# Patient Record
Sex: Male | Born: 1935 | Race: White | Hispanic: No | Marital: Married | Smoking: Never smoker
Health system: Southern US, Community
[De-identification: ages and names within clinical notes are randomized; demographics above are authoritative.]

## PROBLEM LIST (undated history)

## (undated) DIAGNOSIS — I10 Essential (primary) hypertension: Secondary | ICD-10-CM

## (undated) DIAGNOSIS — R001 Bradycardia, unspecified: Secondary | ICD-10-CM

## (undated) DIAGNOSIS — E785 Hyperlipidemia, unspecified: Secondary | ICD-10-CM

## (undated) DIAGNOSIS — I2510 Atherosclerotic heart disease of native coronary artery without angina pectoris: Secondary | ICD-10-CM

## (undated) DIAGNOSIS — N400 Enlarged prostate without lower urinary tract symptoms: Secondary | ICD-10-CM

## (undated) DIAGNOSIS — R42 Dizziness and giddiness: Secondary | ICD-10-CM

## (undated) DIAGNOSIS — I639 Cerebral infarction, unspecified: Secondary | ICD-10-CM

## (undated) DIAGNOSIS — R1013 Epigastric pain: Secondary | ICD-10-CM

## (undated) DIAGNOSIS — I6521 Occlusion and stenosis of right carotid artery: Secondary | ICD-10-CM

## (undated) DIAGNOSIS — K3189 Other diseases of stomach and duodenum: Secondary | ICD-10-CM

## (undated) HISTORY — PX: HIP SURGERY: SHX245

## (undated) HISTORY — DX: Dizziness and giddiness: R42

## (undated) HISTORY — DX: Epigastric pain: R10.13

## (undated) HISTORY — DX: Hyperlipidemia, unspecified: E78.5

## (undated) HISTORY — PX: OTHER SURGICAL HISTORY: SHX169

## (undated) HISTORY — DX: Occlusion and stenosis of right carotid artery: I65.21

## (undated) HISTORY — DX: Cerebral infarction, unspecified: I63.9

## (undated) HISTORY — PX: SKIN CANCER EXCISION: SHX779

## (undated) HISTORY — DX: Essential (primary) hypertension: I10

## (undated) HISTORY — DX: Bradycardia, unspecified: R00.1

## (undated) HISTORY — DX: Benign prostatic hyperplasia without lower urinary tract symptoms: N40.0

## (undated) HISTORY — DX: Other diseases of stomach and duodenum: K31.89

## (undated) HISTORY — DX: Atherosclerotic heart disease of native coronary artery without angina pectoris: I25.10

## (undated) HISTORY — PX: TONSILLECTOMY: SUR1361

---

## 2004-11-02 ENCOUNTER — Ambulatory Visit: Payer: Self-pay | Admitting: Internal Medicine

## 2006-07-08 ENCOUNTER — Ambulatory Visit: Payer: Self-pay | Admitting: Gastroenterology

## 2006-07-29 ENCOUNTER — Ambulatory Visit: Payer: Self-pay | Admitting: Gastroenterology

## 2006-08-19 ENCOUNTER — Inpatient Hospital Stay (HOSPITAL_COMMUNITY): Admission: AD | Admit: 2006-08-19 | Discharge: 2006-08-20 | Payer: Self-pay | Admitting: *Deleted

## 2007-07-16 ENCOUNTER — Ambulatory Visit: Payer: Self-pay | Admitting: Internal Medicine

## 2007-09-30 ENCOUNTER — Ambulatory Visit: Payer: Self-pay | Admitting: Internal Medicine

## 2009-01-17 ENCOUNTER — Ambulatory Visit: Payer: Self-pay | Admitting: Internal Medicine

## 2010-01-23 ENCOUNTER — Encounter (INDEPENDENT_AMBULATORY_CARE_PROVIDER_SITE_OTHER): Payer: Self-pay | Admitting: *Deleted

## 2010-01-23 LAB — CONVERTED CEMR LAB
ALT: 33 units/L
AST: 33 units/L
Alkaline Phosphatase: 67 units/L
BUN: 15 mg/dL
Basophils Relative: 0.3 %
CO2: 31 meq/L
Calcium: 9.2 mg/dL
Chloride: 101 meq/L
Cholesterol: 122 mg/dL
Creatinine, Ser: 1 mg/dL
Eosinophils Relative: 3 %
Glucose, Bld: 107 mg/dL
HCT: 44.5 %
HDL: 33.8 mg/dL
Hemoglobin: 15.7 g/dL
Hgb A1c MFr Bld: 5.4 %
LDL Cholesterol: 69.6 mg/dL
Lymphocytes, automated: 22.3 %
MCV: 93.7 fL
Monocytes Relative: 9.5 %
Neutrophils Relative %: 64.9 %
Platelets: 155 10*3/uL
Potassium: 4.4 meq/L
RBC: 4.75 M/uL
RDW: 13.4 %
Sodium: 137.8 meq/L
Total Bilirubin: 1.6 mg/dL
Triglyceride fasting, serum: 93 mg/dL
WBC: 6 10*3/uL

## 2010-04-09 ENCOUNTER — Encounter: Payer: Self-pay | Admitting: Cardiovascular Disease

## 2010-05-16 ENCOUNTER — Ambulatory Visit: Payer: Self-pay | Admitting: Cardiovascular Disease

## 2010-05-16 DIAGNOSIS — I25110 Atherosclerotic heart disease of native coronary artery with unstable angina pectoris: Secondary | ICD-10-CM | POA: Insufficient documentation

## 2010-05-16 DIAGNOSIS — I2510 Atherosclerotic heart disease of native coronary artery without angina pectoris: Secondary | ICD-10-CM | POA: Insufficient documentation

## 2010-05-16 DIAGNOSIS — I10 Essential (primary) hypertension: Secondary | ICD-10-CM | POA: Insufficient documentation

## 2010-05-16 DIAGNOSIS — E785 Hyperlipidemia, unspecified: Secondary | ICD-10-CM | POA: Insufficient documentation

## 2010-05-16 HISTORY — DX: Hyperlipidemia, unspecified: E78.5

## 2010-05-16 HISTORY — DX: Essential (primary) hypertension: I10

## 2010-05-17 ENCOUNTER — Encounter (INDEPENDENT_AMBULATORY_CARE_PROVIDER_SITE_OTHER): Payer: Self-pay | Admitting: *Deleted

## 2010-07-20 ENCOUNTER — Telehealth: Payer: Self-pay | Admitting: Cardiovascular Disease

## 2010-07-25 ENCOUNTER — Ambulatory Visit: Payer: Medicare Other | Admitting: Internal Medicine

## 2010-07-31 DIAGNOSIS — R1013 Epigastric pain: Secondary | ICD-10-CM

## 2010-07-31 DIAGNOSIS — K3189 Other diseases of stomach and duodenum: Secondary | ICD-10-CM | POA: Insufficient documentation

## 2010-07-31 DIAGNOSIS — R0789 Other chest pain: Secondary | ICD-10-CM | POA: Insufficient documentation

## 2010-07-31 HISTORY — DX: Other diseases of stomach and duodenum: K31.89

## 2010-08-10 ENCOUNTER — Telehealth (INDEPENDENT_AMBULATORY_CARE_PROVIDER_SITE_OTHER): Payer: Self-pay | Admitting: *Deleted

## 2010-08-14 ENCOUNTER — Encounter: Payer: Self-pay | Admitting: Cardiovascular Disease

## 2010-08-14 ENCOUNTER — Ambulatory Visit: Payer: Self-pay

## 2010-08-14 ENCOUNTER — Encounter (HOSPITAL_COMMUNITY): Admission: RE | Admit: 2010-08-14 | Discharge: 2010-10-03 | Payer: Self-pay | Admitting: Cardiovascular Disease

## 2010-08-14 ENCOUNTER — Ambulatory Visit: Payer: Self-pay | Admitting: Cardiovascular Disease

## 2010-10-11 ENCOUNTER — Ambulatory Visit: Payer: Self-pay | Admitting: Cardiology

## 2010-10-11 DIAGNOSIS — M79609 Pain in unspecified limb: Secondary | ICD-10-CM | POA: Insufficient documentation

## 2010-10-12 ENCOUNTER — Ambulatory Visit: Payer: Self-pay

## 2011-01-03 NOTE — Progress Notes (Signed)
Summary: Nuclear pre procedure  Phone Note Outgoing Call Call back at [facility] Phone [phone]   Call placed by: Rea College, CMA,  August 10, 2010 3:26 PM Call placed to: Patient Summary of Call: Reviewed information on Myoview Information Sheet (see scanned document for further details).  Spoke with patient's wife.     Nuclear Med Background Indications for Stress Test: Evaluation for Ischemia, Stent Patency   History: Heart Catheterization, Myocardial Perfusion Study, Stents      Nuclear Pre-Procedure Cardiac Risk Factors: Hypertension, Lipids Height (in): 71

## 2011-01-03 NOTE — Assessment & Plan Note (Signed)
Summary: NP6/AMD   Visit Type:  Initial Consult Primary Provider:  Dr Aletha Halim  CC:  follow up.  History of Present Illness: [name] is a very pleasant 77 year old gentleman with a history of coronary artery disease, stent placed to his proximal RCA in September 2007, taxis stent 3.0 x 24 mm, with stress test 2 years ago the report of this is unavailable to [place] at this time who presents to establish care. I know [name] through [facility]. His prior care physician is Dr. Judithann Pollard  Overall he states that he is doing well. He push mows his garden, his planted tomatoes and lots of other flowers. He exerts himself frequently and has felt well with no symptoms of shortness of breath or chest discomfort.  He has been taking simvastatin 80 mg alternating with 40 mg daily. He does have cramping in his legs particularly at nighttime. Otherwise he has no complaints.  Preventive Screening-Counseling & Management  Alcohol-Tobacco     Smoking Status: never  Caffeine-Diet-Exercise     Does Patient Exercise: yes      Drug Use:  no.    Current Medications (verified): 1)  Simvastatin 80 Mg Tabs (Simvastatin) .... Take 1/2 Tablet By Mouth Daily At Bedtime 2)  Lisinopril 10 Mg Tabs (Lisinopril) .... Take One Tablet By Mouth Daily 3)  Lisinopril-Hydrochlorothiazide 10-12.5 Mg Tabs (Lisinopril-Hydrochlorothiazide) .... Once Daily 4)  Lyrica 100 Mg Caps (Pregabalin) .... Two Times A Day 5)  Plavix 75 Mg Tabs (Clopidogrel Bisulfate) .... Take One Tablet By Mouth Daily 6)  Terazosin Hcl 2 Mg Caps (Terazosin Hcl) .... Once Daily 7)  Aspirin 81 Mg Tbec (Aspirin) .... Take One Tablet By Mouth Daily 8)  Folic Acid   Powd (Folic Acid) .... Once Daily 9)  Multivitamins   Tabs (Multiple Vitamin) .... Once Daily 10)  Flax   Oil (Flaxseed (Linseed)) .... Once Daily  Allergies (verified): 1)  ! Pcn  Past History:  Past Surgical History: intestinal blockage skin cancer x  2 stent x 1 Tonsillectomy  Family History: CHF Family History of Cancer:  Family History of Hyperlipidemia:  Family History of Hypertension:   Social History: Full Time - Scientist, research (physical sciences) Tobacco Use - No.  Alcohol Use - yes -- wine or martinis  2-3 a night 3-4 times a week Regular Exercise - yes Drug Use - no Married  Smoking Status:  never Does Patient Exercise:  yes Drug Use:  no  Review of Systems  The patient denies fever, weight loss, weight gain, vision loss, decreased hearing, hoarseness, chest pain, syncope, dyspnea on exertion, peripheral edema, prolonged cough, abdominal pain, incontinence, muscle weakness, depression, and enlarged lymph nodes.         leg cramps  Vital Signs:  Patient profile:   77 year old male Height:      71 inches Weight:      190 pounds BMI:     26.60 Pulse rate:   62 / minute BP sitting:   124 / 76  (right arm) Cuff size:   regular  Vitals Entered By: Hardin Negus, RMA (May 16, 2010 10:08 AM)  Physical Exam  General:  Well developed, well nourished, in no acute distress. Head:  normocephalic and atraumatic Neck:  Neck supple, no JVD. No masses, thyromegaly or abnormal cervical nodes. Lungs:  Clear bilaterally to auscultation and percussion. Heart:  Non-displaced PMI, chest non-tender; regular rate and rhythm, S1, S2 with I/VI SEM RSB, rubs or gallops.  Carotid upstroke normal, no bruit . Pedals normal pulses. No edema, no varicosities. Abdomen:  Bowel sounds positive; abdomen soft and non-tender without masses Msk:  Back normal, normal gait. Muscle strength and tone normal. Pulses:  pulses normal in all 4 extremities Extremities:  No clubbing or cyanosis. Neurologic:  Alert and oriented x 3. Skin:  Intact without lesions or rashes. Psych:  Normal affect.    EKG  Procedure date:  05/16/2010  Findings:      normal sinus rhythm with rate 57 beats per minute, left axis deviation, poor R-wave progression through the  precordial leads.  Impression & Recommendations:  Problem # 1:  CAD, NATIVE VESSEL (ICD-414.01) history of coronary artery disease with stent placed to his proximal RCA in 2007. Stress test 2 years ago and we'll try to obtain this report for our records. He is relatively asymptomatic with exertion. We will continue to manage him aggressively with medications.  He is interested in performing a stress test later this fall.  His updated medication list for this problem includes:    Lisinopril 10 Mg Tabs (Lisinopril) .Marland Kitchen... Take one tablet by mouth daily    Lisinopril-hydrochlorothiazide 10-12.5 Mg Tabs (Lisinopril-hydrochlorothiazide) ..... Once daily    Plavix 75 Mg Tabs (Clopidogrel bisulfate) .Marland Kitchen... Take one tablet by mouth daily    Aspirin 81 Mg Tbec (Aspirin) .Marland Kitchen... Take one tablet by mouth daily  Problem # 2:  HYPERTENSION, BENIGN (ICD-401.1) Her pressure is well controlled on his current medication regimen.  His updated medication list for this problem includes:    Lisinopril 10 Mg Tabs (Lisinopril) .Marland Kitchen... Take one tablet by mouth daily    Lisinopril-hydrochlorothiazide 10-12.5 Mg Tabs (Lisinopril-hydrochlorothiazide) ..... Once daily    Terazosin Hcl 2 Mg Caps (Terazosin hcl) ..... Once daily    Aspirin 81 Mg Tbec (Aspirin) .Marland Kitchen... Take one tablet by mouth daily  Problem # 3:  HYPERLIPIDEMIA-MIXED (ICD-272.4) He is on simvastatin 80 mg alternating with 40 mg. Given the new restrictions on simvastatin, and his leg cramps, we have suggested he decrease the simvastatin to 40 mg daily. I will try to obtain his most recent lipid panel for our records. If he is not at goal on a lower dose, we may need to change him to Crestor. He reports having tried Lipitor previously and it did not work for him.  His updated medication list for this problem includes:    Simvastatin 80 Mg Tabs (Simvastatin) .Marland Kitchen... Take 1/2 tablet by mouth daily at bedtime

## 2011-01-03 NOTE — Miscellaneous (Signed)
Summary: labs 01/23/10  Clinical Lists Changes  Observations: Added new observation of CALCIUM: 9.2 mg/dL (65/78/4696 2:95) Added new observation of CO2 PLSM/SER: 31.0 meq/L (01/23/2010 9:20) Added new observation of CL SERUM: 101 meq/L (01/23/2010 9:20) Added new observation of K SERUM: 4.4 meq/L (01/23/2010 9:20) Added new observation of NA: 137.8 meq/L (01/23/2010 9:20) Added new observation of CREATININE: 1.0 mg/dL (28/41/3244 0:10) Added new observation of BUN: 15 mg/dL (27/25/3664 4:03) Added new observation of BG RANDOM: 107 mg/dL (47/42/5956 3:87) Added new observation of BILI TOTAL: 1.6 mg/dL (56/43/3295 1:88) Added new observation of SGOT (AST): 33 units/L (01/23/2010 9:19) Added new observation of SGPT (ALT): 33 units/L (01/23/2010 9:19) Added new observation of ALK PHOS: 67 units/L (01/23/2010 9:19) Added new observation of HGBA1C: 5.4 % (01/23/2010 9:19) Added new observation of TRIGLYCERIDE: 93 mg/dL (41/66/0630 1:60) Added new observation of HDL: 33.8 mg/dL (10/93/2355 7:32) Added new observation of LDL: 69.6 mg/dL (20/25/4270 6:23) Added new observation of CHOLESTEROL: 122 mg/dL (76/28/3151 7:61) Added new observation of BASOPHIL %: 0.3 % (01/23/2010 9:17) Added new observation of % EOS AUTO: 3.0 % (01/23/2010 9:17) Added new observation of MONOCYTE %: 9.5 % (01/23/2010 9:17) Added new observation of LYMPH %: 22.3 % (01/23/2010 9:17) Added new observation of PMN %: 64.9 % (01/23/2010 9:17) Added new observation of RDW: 13.4 % (01/23/2010 9:17) Added new observation of MCV: 93.7 fL (01/23/2010 9:17) Added new observation of PLATELETK/UL: 155 K/uL (01/23/2010 9:17) Added new observation of RBC M/UL: 4.75 M/uL (01/23/2010 9:17) Added new observation of HCT: 44.5 % (01/23/2010 9:17) Added new observation of HGB: 15.7 g/dL (60/73/7106 2:69) Added new observation of WBC COUNT: 6.0 10*3/microliter (01/23/2010 9:17)      -  Date:  01/23/2010    WBC: 6.0    HGB:  15.7    HCT: 44.5    RBC: 4.75    PLT: 155    MCV: 93.7    RDW: 13.4    Neutrophil: 64.9    Lymphs: 22.3    Monos: 9.5    Eos: 3.0    Basophil: 0.3    Cholesterol: 122    LDL: 69.6    HDL: 33.8    Triglycerides: 93    HgbA1c: 5.4    Alk Phos: 67    SGPT (ALT): 33    SGOT (AST): 33    Bilirubin-total: 1.6    BG Random: 107    BUN: 15    Creatinine: 1.0    Sodium: 137.8    Potassium: 4.4    Chloride: 101    CO2 Total: 31.0    Calcium: 9.2

## 2011-01-03 NOTE — Progress Notes (Signed)
Summary: STRESS TEST  Phone Note Call from Patient Call back at Home Phone [phone]   Caller: WIFE (SANDY) Call For: [facility] Summary of Call: WAS TOLD BY Mariah Milling TO GET A STRESS TEST- IT DO NOT SEE AND ORDER FOR IT Initial call taken by: Harlon Flor,  July 20, 2010 4:22 PM  Follow-up for Phone Call        pt seen 6/14 would like to schedule stress test what diagnosis? Benedict Needy, RN  July 20, 2010 4:47 PM   Additional Follow-up for Phone Call Additional follow up Details #1::        Insurance will cover a routine treadmill,  will not cover a myoview is he has no chest pain.      Appended Document: STRESS TEST line busy  Appended Document: STRESS TEST Spoke to wife she is unhappy doing routine treadmill, told me I could schedule it after labor day and that Tues are not good days. She questioned how much a myoview would cost. Marchelle Folks found out that they would cost $4800-to well over $5000.  Appended Document: STRESS TEST we will try to see if we can get fuill stress approved. No guarantee, but we will try.  Appended Document: STRESS TEST Patient now reported indigestion, chest discomfort symptom.  Appended Document: STRESS TEST    Clinical Lists Changes  Problems: Added new problem of DYSPEPSIA (ICD-536.8) Added new problem of CHEST DISCOMFORT (UJW-119.14) Orders: Added new Referral order of Nuclear Stress Test (Nuc Stress Test) - Signed

## 2011-01-03 NOTE — Assessment & Plan Note (Signed)
Summary: ROV/AMD   Visit Type:  Follow-up Primary Provider:  Dr Aletha Halim  CC:  Calf pain.Marland Kitchen  History of Present Illness: 77 yo with history of CAD, HTN, hyperlipidemia presents for evaluation of left calf pain.  Patient, of note, had a recent negative ETT-myoview in 9/11. For the last 2-3 weeks, he has noted left lateral calf aching with exertion (walking fast, riding stationary bike).  He has no pain at rest.  No swelling in his leg. No recent long car trip, surgery, or prolonged bedrest.   Current Medications (verified): 1)  Simvastatin 80 Mg Tabs (Simvastatin) .... Take 1/2 Tablet By Mouth Daily At Bedtime 2)  Lisinopril-Hydrochlorothiazide 10-12.5 Mg Tabs (Lisinopril-Hydrochlorothiazide) .... Once Daily 3)  Lyrica 100 Mg Caps (Pregabalin) .... Two Times A Day 4)  Plavix 75 Mg Tabs (Clopidogrel Bisulfate) .... Take One Tablet By Mouth Daily 5)  Terazosin Hcl 2 Mg Caps (Terazosin Hcl) .... Once Daily 6)  Aspirin 81 Mg Tbec (Aspirin) .... Take One Tablet By Mouth Daily 7)  Folic Acid   Powd (Folic Acid) .... Once Daily 8)  Multivitamins   Tabs (Multiple Vitamin) .... Once Daily 9)  Flax   Oil (Flaxseed (Linseed)) .... Once Daily 10)  Niaspan 1000 Mg Cr-Tabs (Niacin (Antihyperlipidemic)) .... One Tablet At Bedtime  Allergies (verified): 1)  ! Pcn  Past History:  Past Surgical History: Last updated: 05/16/2010 intestinal blockage skin cancer x 2 stent x 1 Tonsillectomy  Family History: Last updated: 05/16/2010 CHF Family History of Cancer:  Family History of Hyperlipidemia:  Family History of Hypertension:   Social History: Last updated: 05/16/2010 Full Time - real estate broker Tobacco Use - No.  Alcohol Use - yes -- wine or martinis  2-3 a night 3-4 times a week Regular Exercise - yes Drug Use - no Married   Risk Factors: Exercise: yes (05/16/2010)  Risk Factors: Smoking Status: never (05/16/2010)  Past Medical History: 1. HTN 2. Hyperlipidemia 3.  BPH 4. CAD: Taxus DES to RCA in 9/07.  ETT-myoview in 9/11 with 9'30" exercise, no ECG changes, and no perfusion defects so no evidence for ischemia or infarction (he did have chest pain on the treadmill).   Family History: Reviewed history from 05/16/2010 and no changes required. CHF Family History of Cancer:  Family History of Hyperlipidemia:  Family History of Hypertension:   Social History: Reviewed history from 05/16/2010 and no changes required. Full Time - real Therapist, occupational Tobacco Use - No.  Alcohol Use - yes -- wine or martinis  2-3 a night 3-4 times a week Regular Exercise - yes Drug Use - no Married   Review of Systems       All systems reviewed and negative except as per HPI.   Vital Signs:  Patient profile:   77 year old male Height:      71 inches Weight:      189.50 pounds BMI:     26.53 Pulse rate:   65 / minute BP sitting:   137 / 78  (left arm) Cuff size:   regular  Vitals Entered By: Bishop Dublin, CMA (October 11, 2010 12:42 PM)  Physical Exam  General:  Well developed, well nourished, in no acute distress. Neck:  Neck supple, no JVD. No masses, thyromegaly or abnormal cervical nodes. Lungs:  Clear bilaterally to auscultation and percussion. Heart:  Non-displaced PMI, chest non-tender; regular rate and rhythm, S1, S2 without murmurs, rubs or gallops. Carotid upstroke normal, no bruit. Unable to palpate  left DP pulse though left PT is 2+.   No edema, chronic varicosities in both lower legs.  Abdomen:  Bowel sounds positive; abdomen soft and non-tender without masses, organomegaly, or hernias noted. No hepatosplenomegaly. Extremities:  No clubbing or cyanosis. Neurologic:  Alert and oriented x 3. Psych:  Normal affect.   Impression & Recommendations:  Problem # 1:  CALF PAIN, LEFT (ICD-729.5) Exertional lateral calf pain.  No pain at rest.  Good PT pulse but unable to palpate DP pulse.  He has chronic varicosities in both lower legs and no  edema/swelling.  I will get lower extremity arterial dopplers to rule out PAD as a cause of the symptoms.  I doubt DVT but will get a look at the venous system when we do the arterial dopplers.  The pain may actually be orthopedic.   Other Orders: Arterial Duplex Lower Extremity (Arterial Duplex Low) Venous Duplex Lower Extremity (Venous Duplex Lower)  Patient Instructions: 1)  Your physician wants you to follow-up in:  1 year with Dr. Billey Co will receive a reminder letter in the mail two months in advance. If you don't receive a letter, please call our office to schedule the follow-up appointment. 2)  Your physician has requested that you have a lower or upper extremity arterial duplex.  This test is an ultrasound of the arteries in the legs or arms.  It looks at arterial blood flow in the legs and arms.  Allow one hour for Lower and Upper Arterial scans. There are no restrictions or special instructions. 3)  Your physician has requested that you have a lower or upper extremity venous duplex.  This test is an ultrasound of the veins in the legs or arms.  It looks at venous blood flow that carries blood from the heart to the legs or arms.  Allow one hour for a Lower Venous exam.  Allow thirty minutes for an Upper Venous exam. There are no restrictions or special instructions.

## 2011-01-03 NOTE — Letter (Signed)
Summary: Medical Record Release  Medical Record Release   Imported By: Harlon Flor 05/16/2010 11:31:43  _____________________________________________________________________  External Attachment:    Type:   Image     Comment:   External Document

## 2011-01-03 NOTE — Letter (Signed)
Summary: Historic Patient File  Historic Patient File   Imported By: [place] Carbo 05/16/2010 15:10:34  _____________________________________________________________________  External Attachment:    Type:   Image     Comment:   External Document

## 2011-01-03 NOTE — Letter (Signed)
Summary: Medical Record Release  Medical Record Release   Imported By: Harlon Flor 10/11/2010 15:31:57  _____________________________________________________________________  External Attachment:    Type:   Image     Comment:   External Document

## 2011-01-03 NOTE — Assessment & Plan Note (Signed)
Summary: Cardiology Nuclear Testing  Nuclear Med Background Indications for Stress Test: Evaluation for Ischemia, Stent Patency   History: Heart Catheterization, Myocardial Perfusion Study, Stents   Symptoms: Chest Pain, Chest Pain with Exertion, Chest Tightness, Chest Tightness with Exertion, Fatigue with Exertion, Light-Headedness, Rapid HR    Nuclear Pre-Procedure Cardiac Risk Factors: Hypertension, Lipids, TIA Caffeine/Decaff Intake: None NPO After: 7:00 PM Lungs: clear IV 0.9% NS with Angio Cath: 22g     IV Site: R Forearm IV Started by: Irean Hong, RN Chest Size (in) 44     Height (in): 71 Weight (lb): 190 BMI: 26.60  Nuclear Med Study 1 or 2 day study:  1 day     Stress Test Type:  Stress Reading MD:  Charlton Haws, MD     Referring MD:  T.Gollan,MD Resting Radionuclide:  Technetium 66m Tetrofosmin     Resting Radionuclide Dose:  11 mCi  Stress Radionuclide:  Technetium 67m Tetrofosmin     Stress Radionuclide Dose:  33 mCi   Stress Protocol Exercise Time (min):  9:30 min     Max HR:  137 bpm     Predicted Max HR:  146 bpm  Max Systolic BP: 165 mm Hg     Percent Max HR:  93.84 %     METS: 10.9 Rate Pressure Product:  45409    Stress Test Technologist:  Cathlyn Parsons, RN     Nuclear Technologist:  Doyne Keel, CNMT  Rest Procedure  Myocardial perfusion imaging was performed at rest 45 minutes following the intravenous administration of Technetium 65m Tetrofosmin.  Stress Procedure  The patient exercised for 9:30 mins .  The patient stopped due to level of exertion and had chest tightness 5/10.  Chest tightness relieved in recovery. Patient had chest pain at in recovery 1/10. Chest pain lasted approx. .  There were no significant ST-T wave changes. Patient had frequent PVC's with stress test and occasional PAC's.  Technetium 69m Tetrofosmin was injected at peak exercise and myocardial perfusion imaging was performed after a brief delay.  QPS Raw Data  Images:  Normal; no motion artifact; normal heart/lung ratio. Stress Images:  Normal homogeneous uptake in all areas of the myocardium. Rest Images:  Normal homogeneous uptake in all areas of the myocardium. Subtraction (SDS):  Normal Transient Ischemic Dilatation:  0.99  (Normal <1.22)  Lung/Heart Ratio:  0.33  (Normal <0.45)  Quantitative Gated Spect Images QGS EDV:  100 ml QGS ESV:  37 ml QGS EF:  63 % QGS cine images:  normal  Findings Normal nuclear study      Overall Impression  Exercise Capacity: Good exercise capacity. BP Response: Normal blood pressure response. Clinical Symptoms: Chest Tightnesss ECG Impression: No significant ST segment change suggestive of ischemia. Overall Impression: Normal stress nuclear study. Overall Impression Comments: Patient did have chest pain with exertion  Appended Document: Cardiology Nuclear Testing Normal study  Appended Document: Cardiology Nuclear Testing notified patient's wife of results from stress test.

## 2011-01-16 ENCOUNTER — Encounter: Payer: Self-pay | Admitting: Cardiovascular Disease

## 2011-01-19 ENCOUNTER — Encounter: Payer: Self-pay | Admitting: Cardiovascular Disease

## 2011-01-20 ENCOUNTER — Encounter: Payer: Self-pay | Admitting: Cardiovascular Disease

## 2011-02-07 ENCOUNTER — Telehealth (INDEPENDENT_AMBULATORY_CARE_PROVIDER_SITE_OTHER): Payer: Self-pay | Admitting: *Deleted

## 2011-02-13 NOTE — Letter (Addendum)
Summary: Letter  Letter   Imported By: Harlon Flor 02/07/2011 16:02:30  _____________________________________________________________________  External Attachment:    Type:   Image     Comment:   External Document  Appended Document: Letter I would feel comfortable with him on ASA 81 mg x2. Ok to hold plavix. Plavix generic in may of this year. Ok for him to come in if he would like. If he is happy with above, I can see him at regular time later in year or anytime he needs [place]  Appended Document: Letter Attempted to call pt with msg above, LMOM TCB.  Appended Document: Letter Spoke to pt and his wife, notified of above recommendation. Pt had actually called yesterday and made a f/u with Dr. Mariah Milling for next week 02/20/11.

## 2011-02-13 NOTE — Progress Notes (Signed)
Summary: Called pt  Phone Note Outgoing Call Call back at [facility] Phone [phone]   Call placed by: Harlon Flor,  February 07, 2011 3:58 PM Call placed to: Patient Summary of Call: LMOM TCB to schedule an appt with Gollan to discuss a letter that was mailed in regards to Plavix. Initial call taken by: Harlon Flor,  February 07, 2011 3:59 PM

## 2011-02-20 ENCOUNTER — Ambulatory Visit (INDEPENDENT_AMBULATORY_CARE_PROVIDER_SITE_OTHER): Payer: Medicare Other | Admitting: Cardiovascular Disease

## 2011-02-20 ENCOUNTER — Encounter: Payer: Self-pay | Admitting: Cardiovascular Disease

## 2011-02-20 VITALS — BP 118/72 | HR 55 | Ht 71.0 in | Wt 192.1 lb

## 2011-02-20 DIAGNOSIS — I2510 Atherosclerotic heart disease of native coronary artery without angina pectoris: Secondary | ICD-10-CM

## 2011-02-20 DIAGNOSIS — I10 Essential (primary) hypertension: Secondary | ICD-10-CM

## 2011-02-20 DIAGNOSIS — E785 Hyperlipidemia, unspecified: Secondary | ICD-10-CM

## 2011-02-20 NOTE — Progress Notes (Signed)
   Patient ID: [name], male    DOB: Sep 17, 1933, 77 y.o.   MRN: [id number]  HPI [name] is a very pleasant 77 year old gentleman with a history of coronary artery disease, stent placed to his proximal RCA in September 2007, taxis stent 3.0 x 24 mm, with stress test 08/2010, who presents for routine followup. He is a patient of Dr. Aletha Halim.  Overall he states that he is doing well. He has been active, goes to the gym several times per week and uses the bike for up to 45 minutes with no symptoms of shortness of breath or chest pain. No significant lower extremity edema or lightheadedness.  He has been tolerating simvastatin 40 mg without any significant cramping in his legs  Labs from February 2012 show total cholesterol 112, LDL 56, HDL 33.  NMR lipoprofile shows LDL particle number 1305  EKG shows normal sinus rhythm with a rate of 55 beats per minute with poor R-wave progression through the precordial leads, left axis deviation   Review of Systems  Constitutional: Negative.   HENT: Negative.   Eyes: Negative.   Respiratory: Negative.   Cardiovascular: Negative.   Gastrointestinal: Negative.   Musculoskeletal: Negative.   Skin: Negative.   Neurological: Negative.   Hematological: Negative.   Psychiatric/Behavioral: Negative.       Physical Exam  Nursing note and vitals reviewed. Constitutional: He is oriented to person, place, and time. He appears well-developed and well-nourished.  HENT:  Head: Normocephalic.  Nose: Nose normal.  Mouth/Throat: Oropharynx is clear and moist.  Eyes: Conjunctivae are normal. Pupils are equal, round, and reactive to light.  Neck: Normal range of motion. Neck supple. No JVD present.  Cardiovascular: Normal rate, regular rhythm, normal heart sounds and intact distal pulses.  Exam reveals no gallop and no friction rub.   No murmur heard. Pulmonary/Chest: Effort normal and breath sounds normal. No respiratory distress. He has no wheezes.  He has no rales. He exhibits no tenderness.  Abdominal: Soft. Bowel sounds are normal. He exhibits no distension. There is no tenderness.       Obese  Musculoskeletal: Normal range of motion. He exhibits no edema and no tenderness.  Lymphadenopathy:    He has no cervical adenopathy.  Neurological: He is alert and oriented to person, place, and time. Coordination normal.  Skin: Skin is warm and dry. No rash noted. No erythema.  Psychiatric: He has a normal mood and affect. His behavior is normal. Judgment and thought content normal.      Assessment and Plan

## 2011-02-20 NOTE — Assessment & Plan Note (Signed)
Currently with no symptoms of angina. No further workup at this time. Continue current medication regimen. 

## 2011-02-20 NOTE — Assessment & Plan Note (Signed)
Blood pressure is well controlled on today's visit. No changes made to the medications. 

## 2011-02-20 NOTE — Assessment & Plan Note (Addendum)
Cholesterol is at goal on the current lipid regimen. No changes to the medications were made.  

## 2011-02-20 NOTE — Patient Instructions (Signed)
Follow up in 1 year. No changes in medications.

## 2011-04-09 ENCOUNTER — Telehealth: Payer: Self-pay | Admitting: Cardiovascular Disease

## 2011-04-09 NOTE — Telephone Encounter (Signed)
Spoke to pt's wife, she states this weekend pt had a hard time getting out of bed and stated he "had a hard time getting his legs to work" and then fell in the bathroom, and pt has no memory of fall. Pt did not have any injury from fall, and pt is asymptomatic at this time. Pt's wife also states that pt had virus recently with low grade fever. Pt does have h/o TIA over 8 years ago. I have scheduled pt for follow up first available for 04/11/11 to see Dr. Mariah Milling. Instructed pt's wife to call 911 at the first sign of any weakness, slurred speech, vision or mental impairment. Will discuss with Dr. Mariah Milling. Pt's wife ok with this.

## 2011-04-09 NOTE — Telephone Encounter (Signed)
Pt's wife thinks that the pt had a TIA over the weekend and thinks that he needs to be assessed.  Please advise.

## 2011-04-10 ENCOUNTER — Encounter: Payer: Self-pay | Admitting: *Deleted

## 2011-04-10 ENCOUNTER — Encounter: Payer: Self-pay | Admitting: Cardiovascular Disease

## 2011-04-10 ENCOUNTER — Ambulatory Visit (INDEPENDENT_AMBULATORY_CARE_PROVIDER_SITE_OTHER): Payer: Medicare Other | Admitting: Cardiovascular Disease

## 2011-04-10 DIAGNOSIS — I2510 Atherosclerotic heart disease of native coronary artery without angina pectoris: Secondary | ICD-10-CM

## 2011-04-10 DIAGNOSIS — K3189 Other diseases of stomach and duodenum: Secondary | ICD-10-CM

## 2011-04-10 DIAGNOSIS — R1013 Epigastric pain: Secondary | ICD-10-CM

## 2011-04-10 DIAGNOSIS — R55 Syncope and collapse: Secondary | ICD-10-CM

## 2011-04-10 DIAGNOSIS — E785 Hyperlipidemia, unspecified: Secondary | ICD-10-CM

## 2011-04-10 DIAGNOSIS — I10 Essential (primary) hypertension: Secondary | ICD-10-CM

## 2011-04-10 NOTE — Assessment & Plan Note (Signed)
Blood pressure is borderline low and he is orthostatic. We have suggested he hold his lisinopril HCTZ for now and monitor his blood pressure.

## 2011-04-10 NOTE — Assessment & Plan Note (Signed)
It is uncertain if he had a total loss of consciousness or just near syncope with a fall. He does report some orthostasis and I am concerned that this could be contributing to his symptoms. He also takes a prostate medication could contribute to orthostasis. His blood pressure is low and we have suggested he hold his blood pressure medication for now. He also has sinus bradycardia but was not on any rate controlling medications.  His wife is concerned about restenosis of his stent and she would like a stress test for him. We have suggested we schedule it in a week or 2 to allow time to see how he feels without a blood pressure medication. If he feels well and is exercising, perhaps we could hold off on the stress test.

## 2011-04-10 NOTE — Patient Instructions (Addendum)
Stop the lisinopril/HCTZ Monitor the blood pressure. Please call us if you have new issues that need to be addressed before your next appt.  We will call you for a follow up Appt. In 6 months Your physician has requested that you have a stress test myoview at [facility]. You were given this information today.

## 2011-04-10 NOTE — Telephone Encounter (Signed)
Pt scheduled to come in sooner today 04/10/11 to see Dr. Mariah Milling.

## 2011-04-10 NOTE — Telephone Encounter (Signed)
See office note 04/10/11.

## 2011-04-10 NOTE — Assessment & Plan Note (Signed)
No symptoms of chest pain or shortness of breath. He did have recent syncope. He did not have symptoms prior to his last stent placement. This was found by stress testing. Last stress test in 2011 showed no ischemia.

## 2011-04-10 NOTE — Assessment & Plan Note (Signed)
I suggested he continue his current medications for now

## 2011-04-10 NOTE — Progress Notes (Signed)
   Patient ID: [name], male    DOB: [birth year], 77 y.o.   MRN: [id number]  HPI Comments: [name] is a very pleasant 77 year old gentleman with a history of coronary artery disease, stent placed to his proximal RCA in September 2007, taxis stent 3.0 x 24 mm, with stress test 08/2010, patient of Dr. [name], Who presents after he passed out last week.  He reports that he had a virus on Thursday and Friday. 3 in the morning on Friday, he had not eaten or drank very much and he went to the bathroom once and felt weak in his legs. He went back to bed and then went to the bathroom later in the morning. He made it into the bathroom though passed out. His wife found him and noticed that his eyes were fixed in a stair. He seemed to come to him he crawled back to bed. He does report significant dizziness with standing too quickly from a sitting or lying position. If he goes slowly or stops for second, the dizziness goes away. He has these symptoms quite frequently and in fact noted them coming into our office today from the waiting room.   He is otherwise reactive and works in the garden. His wife is Concerned that his leg weakness could be secondary to underlying coronary artery disease as he was weak in the legs prior to getting a stent to his RCA several years ago   EKG shows normal sinus rhythm with a rate of 55 beats per minute, left axis deviation      Review of Systems  Constitutional: Positive for fatigue.  HENT: Negative.   Eyes: Negative.   Respiratory: Negative.   Cardiovascular: Negative.        Syncope  Gastrointestinal: Negative.   Musculoskeletal: Negative.        Leg weakness  Skin: Negative.   Neurological: Positive for weakness.  Hematological: Negative.   Psychiatric/Behavioral: Negative.   All other systems reviewed and are negative.   BP 118/60  Pulse 55  Ht 5\' 11"  (1.803 m)  Wt 185 lb 12.8 oz (84.278 kg)  BMI 25.91 kg/m2   Physical Exam  Nursing note and  vitals reviewed. Constitutional: He is oriented to person, place, and time. He appears well-developed and well-nourished.  HENT:  Head: Normocephalic.  Nose: Nose normal.  Mouth/Throat: Oropharynx is clear and moist.  Eyes: Conjunctivae are normal. Pupils are equal, round, and reactive to light.  Neck: Normal range of motion. Neck supple. No JVD present.  Cardiovascular: Normal rate, regular rhythm, S1 normal, S2 normal, normal heart sounds and intact distal pulses.  Exam reveals no gallop and no friction rub.   No murmur heard. Pulmonary/Chest: Effort normal and breath sounds normal. No respiratory distress. He has no wheezes. He has no rales. He exhibits no tenderness.  Abdominal: Soft. Bowel sounds are normal. He exhibits no distension. There is no tenderness.  Musculoskeletal: Normal range of motion. He exhibits no edema and no tenderness.  Lymphadenopathy:    He has no cervical adenopathy.  Neurological: He is alert and oriented to person, place, and time. Coordination normal.  Skin: Skin is warm and dry. No rash noted. No erythema.  Psychiatric: He has a normal mood and affect. His behavior is normal. Judgment and thought content normal.           Assessment and Plan

## 2011-04-11 ENCOUNTER — Ambulatory Visit: Payer: PRIVATE HEALTH INSURANCE | Admitting: Cardiovascular Disease

## 2011-04-20 ENCOUNTER — Ambulatory Visit: Payer: Medicare Other | Admitting: Cardiovascular Disease

## 2011-04-20 ENCOUNTER — Telehealth: Payer: Self-pay | Admitting: *Deleted

## 2011-04-20 DIAGNOSIS — R079 Chest pain, unspecified: Secondary | ICD-10-CM

## 2011-04-20 NOTE — Cardiovascular Report (Signed)
NAME:  [name], [name] NO.:  [id number]   MEDICAL RECORD NO.:  [id number]          PATIENT TYPE:  INP   LOCATION:  2807                         FACILITY:  [facility]   PHYSICIAN:  Darlin Priestly, MD  DATE OF BIRTH:  07/28/1933   DATE OF PROCEDURE:  08/19/2006  DATE OF DISCHARGE:                              CARDIAC CATHETERIZATION   PROCEDURE PERFORMED:  1. Left heart catheterization.  2. Coronary angiography.  3. Left ventriculogram.  4. Placement of RCA-proximal percutaneous transluminal coronary      angioplasty.  5. Placement of coronary stent.   COMPLICATIONS:  None.   INDICATIONS FOR PROCEDURE:  [name] is a 77 year old male patient of Dr.  [name] in [place] with a history of metabolic syndrome, hyperlipidemia,  hypertension, who recently underwent stress test revealing new apical  ischemia.  He has been relatively asymptomatic; however, he recently has  complained of increasing fatigue.  He is now brought for cardiac  catheterization to rule out significant coronary artery disease.   DESCRIPTION OF PROCEDURE:  After obtaining informed consent patient brought  to cardiac catheterization lab.  Right and left groin was shaved, prepped  and draped in the usual sterile fashion.  ECG monitoring was established.  Using modified Seldinger technique a #6 French arterial sheath was inserted  in the right femoral artery.  A 6 French diagnostic catheter was used to  perform diagnostic angiography.   The left main is a medium size vessel with no significant disease.  The left anterior descending is a medium size vessel crosses the apex.  There is a 2 diagonal branch. The LAD is noted to be calcified in its  proximal segment with diffuse 50% stenosis throughout the proximal and mid  segment.  There is a focal 60% stenosis at the take off of the small second  diagonal.  The apical LAD is a small vessel.  The first diagonal is a medium size vessel which  bifurcates distally with  40% early stenosis.  The second diagonal is a small vessel which appears to be totally occluded  in its proximal segment.  There are very faint left to left collaterals to  the apical portion of the diagonal.  Both circumflexes are medium size vessels cross the __________  two obtuse  marginal branches.  The AV groove of the circumflex is also noted to be  calcified in its proximal segment.  There is no high grade AV groove  stenosis.  The first OM is a small vessel which does not appear to have any high grade  stenosis.  The second OM is a medium size vessel with 30% proximal stenosis.  The right coronary artery is a large vessel, is dominant, gives rise to a  PDS, __________  posterolateral branch.  There is diffuse calcification  throughout the proximal and midportion of the RCA.  There are sequential 60,  99 and 40% lesions through the mid-RCA.  The distal RCA has no significant  disease.  PD and posterolateral branch are both medium size vessels.  There is a 40%  narrowing in the proximal  portion of the PLA.  Left ventriculogram reveals an ejection fraction of approximately 40% with  anterolateral, apical as well as inferoapical hypokinesis.  Hemodynamic system __________ pressure 105/56, LV __________ systemic  pressure 105/60, LVP of 14.   INTERVENTIONAL PROCEDURE:  RCA-proximal:  Following diagnostic angiography,  a number 6 [place] JR4 guiding catheter with side holes __________  engaged  in the right coronary ostium.  Next a 0.014 Forte Marker wire was advanced  down the guiding catheter and positioned in the PLA without difficulty.  We  then attempted to pass a __________ a 15 mm Maverick balloon.  However, this  would not pass beyond the proximal segment of the vessel.  We then attempted  to pass a 2.0 Maverick.  Again, this would not pass beyond the proximal  portion of the vessel.  At this point we put a second Cougar XT 0.014  guidewire into the  distal PLA to use as a buddy wire.  We then loaded the  Maverick over the Cougar using the Forte as a buddy wire and were able to  successfully pass the balloon into the mid portion of the RCA.  Several  inflations were then performed throughout the mid and proximal portion of  the RCA with the 2-0 balloon up to 12 atmospheres for a total of  approximately 50 seconds.  Follow-up angiogram revealed good luminal gain  with no dissection.  This balloon was then exchanged for a 3-0 x 15 mm  balloon which again tracked over the Cougar XT guidewire.  Four inflations  to a maximum 12 atmospheres were performed for a total of approximately one  minute and 30 seconds.  Follow-up angiogram revealed good luminal gain, no  dissection.  This balloon was then removed and a Taxus stent 3.0 x 24 mm was  then positioned in the mid and proximal RCA.  This stent was inflated to 12  atmospheres for a total of 23 seconds.  A second inflation to 14 atmospheres  was performed for a total of 15 seconds.  Follow-up angiogram revealed good  luminal gain with no evidence of dissection or thrombus.  This balloon was  then removed and we did post dilate the stent using a Quantum Maverick 3.5 x  15 mm balloon which was positioned in the distal portion of the stent with  two overlapping inflations to 12 and 16 atmospheres respectively in the  distal and mid portion of the stent for maximum of 44 seconds.  Follow-up  angiogram revealed no evidence of dissection or thrombus with excellent  luminal gain.  IV Angiomax used throughout the case.   Final orthogonal angiograms revealed less than 10% residual stenosis in the  proximal mid-RCA stenotic lesion with TIMI 2 flow to distal vessel.  At this  point we elected to conclude the procedure.  All balloons and __________  catheters were removed.  Hemostatic sheaths were sewn in place and the  patient transported back to the ward in stable condition.   CONCLUSION: 1.  Successful percutaneous transluminal coronary balloon angioplasty and      placement of a Taxus 3.0 x 24 mm stent in the mid-proximal RCA lesion,      ultimately post dilated to 3.58 mm.  2. Adjunctive use of Angiomax infusion.  3. Mild to moderately depressed LV systolic function.      Darlin Priestly, MD  Electronically Signed     RHM/MEDQ  D:  08/19/2006  T:  08/20/2006  Job:  [id number]  cc:   Dr. [name], [place], [place]

## 2011-04-20 NOTE — Telephone Encounter (Signed)
Notified pt stress test was normal, possible small part near apex of heart of scarring, but difficult to tell. Overall, normal test. Pt ok with this, pt will f/u 05/2011.

## 2011-04-20 NOTE — Discharge Summary (Signed)
NAME:  [name], MCCARD NO.:  [id number]   MEDICAL RECORD NO.:  [id number]          PATIENT TYPE:  INP   LOCATION:  6525                         FACILITY:  [facility]   PHYSICIAN:  Charmian Muff, NP        DATE OF BIRTH:  1933/04/11   DATE OF ADMISSION:  08/19/2006  DATE OF DISCHARGE:                                 DISCHARGE SUMMARY   PRIMARY CARE PHYSICIAN:  Aletha Halim.   DISCHARGE DIAGNOSES:  1. Coronary atherosclerotic heart disease.  2. Abnormal Cardiolite stress test revealing moderate ischemia in the      apical and apical anterior region.  3. Status post stenting.  Percutaneous transluminal coronary angioplasty      with stenting of the mid and proximal right coronary artery.  4. Hypertension.  5. Dyslipidemia.  6. Recent transient ischemic attack.   BRIEF HISTORY AND PHYSICAL:  [name] is a very pleasant 77 year old white  male who underwent exercise stress testing for followup of his known  coronary artery disease on August 07, 2006.  This test revealed ischemia  in the apical and apical anterior region.  Because of the abnormal stress  test, he was scheduled for left heart catheterization and coronary  arteriogram to assess for obstructive coronary artery disease.  Please see  dictated History and Physical for further details.   HOSPITAL COURSE:  [name] was admitted on August 19, 2006.  He was  taken to the cardiac catheterization suite where left heart catheterization,  coronary arteriogram were performed by Dr. Lenise Herald.  Obstructive  disease was noted in the mid and proximal right coronary artery with 60%  stenosis proximally, 99% stenosis in the mid vessel.  PTCA was performed and  a [place] 3.0 x 24 mm stent was placed in the right coronary artery taking 99%  stenosis to approximately 10% residual.  Ejection fraction was noted to be  decreased at 40%.  He tolerated the procedure without complications,  returned to 6500 where he has had  an uneventful course.  He denies any chest  pain or shortness of breath.  He is ambulating in the room without any  difficulty.  No complaints of pain or bleeding from his right groin site.  His vital signs have remained stable with systolic blood pressures in the  100's.  Heart is regular rate and rhythm, his lungs are clear.  Peripheral  pulses are present with no lower extremity edema, there is no hematoma or  bleeding from the right groin site.  He is deemed ready for discharge.   DISCHARGE INSTRUCTIONS:  He is to avoid strenuous physical activity and no  heavy lifting for the next 2 weeks.  We recommend that he avoid driving for  the next 2 days.  He is to follow a low fat, low cholesterol, low sodium  diet and increase his activity slowly over the next several days.   DISCHARGE MEDICATIONS:  1. Vytorin 10/20 mg daily.  2. Toprol-XL 25 mg daily.  3. Niaspan 1000 mg each evening.  4. Benazepril HCTZ 20/12.5 mg q. day.  5. Lyrica 100 mg b.i.d.  6. Enteric coated ASA 325 daily.  7. Plavix 75 mg daily.  8. Multi vitamin daily.  9. Folic acid daily.  10.Fish oil 1000 mg daily.  11.Metamucil twice daily.   FOLLOWUP:  He is to contact our office with any problems or questions  particularly with any redness, swelling or drainage from his right groin  site.  He will see Dr. [name] back in our [place] office in the next 1-2  weeks.  Our office will contact him with the date and time of his  appointment.           ______________________________  Charmian Muff, NP     LS/MEDQ  D:  08/20/2006  T:  08/20/2006  Job:  [id number]   cc:   Aletha Halim, MD

## 2011-04-20 NOTE — Assessment & Plan Note (Signed)
[name]                           GASTROENTEROLOGY OFFICE NOTE   [name], [name]                       MRN:          [id number]  DATE:07/08/2006                            DOB:          [birth year]    REASON FOR VISIT:  [name] comes in with this wife [name].  He says he has  been having some blood in his stools for the past month.  He denies any  abdominal pain.  He has no upper GI symptoms.  Otherwise, he has been  feeling good.  His Hemoccults revealed two positive stools.  He says he has  not noted any in his stool.  He says it is only with wiping.  In the past,  he has a history of some rectal bleeding secondary to hemorrhoids.  His last  colonoscopic examination was essentially normal.  One prior revealed only  one small polyp.  Otherwise, he is doing well.  He has no family history of  colon cancer.   PHYSICAL EXAMINATION:  GENERAL:  He is a healthy-appearing gentleman.  VITAL SIGNS:  He weighs 199.  Blood pressure 110/64, pulse 60 and regular.  Neck__________ unremarkable.   IMPRESSION:  1.  Occasional rectal bleeding with positive stool, probably hemorrhoidal.  2.  Hypertension.  3.  Arthritis.  4.  History of intestinal blockage.  5.  Hyperlipidemia.  6.  Status post small colon polyp in the past, with his most recent      colonoscopic examination normal.   RECOMMENDATIONS:  Treat him for some hemorrhoids, put him on some Aciphex  for 10 days and then repeat his colon screens, and get his laboratories from  his previous doctors.  If there is any evidence of real significant bleeding  or positive stools, then I think we can reassess this situation.  I think if  it was positive, I would check his upper gastrointestinal tract first since  he has had no real significant pathology detected on two colonoscopic  examinations.                                   Ulyess Mort, MD   SML/MedQ  DD:  07/08/2006  DT:  07/09/2006  Job #:  [id number]

## 2011-05-11 ENCOUNTER — Ambulatory Visit (INDEPENDENT_AMBULATORY_CARE_PROVIDER_SITE_OTHER): Payer: Medicare Other | Admitting: Cardiovascular Disease

## 2011-05-11 ENCOUNTER — Encounter: Payer: Self-pay | Admitting: Cardiovascular Disease

## 2011-05-11 DIAGNOSIS — I10 Essential (primary) hypertension: Secondary | ICD-10-CM

## 2011-05-11 DIAGNOSIS — I2510 Atherosclerotic heart disease of native coronary artery without angina pectoris: Secondary | ICD-10-CM

## 2011-05-11 DIAGNOSIS — E785 Hyperlipidemia, unspecified: Secondary | ICD-10-CM

## 2011-05-11 DIAGNOSIS — R55 Syncope and collapse: Secondary | ICD-10-CM

## 2011-05-11 MED ORDER — LISINOPRIL-HYDROCHLOROTHIAZIDE 10-12.5 MG PO TABS: 0.5000 | ORAL_TABLET | Freq: Every day | ORAL | Status: DC

## 2011-05-11 NOTE — Patient Instructions (Addendum)
You are doing well. Restart lisinopril/HCTZ 10/12.5 daily. Monitor your BP Please call us if you have new issues that need to be addressed before your next appt.  We will call you for a follow up Appt. In 12 months

## 2011-05-13 NOTE — Assessment & Plan Note (Signed)
Continue aggressive lipid management. Goal LDL less than 70 

## 2011-05-13 NOTE — Assessment & Plan Note (Signed)
Blood pressure is well controlled on today's visit. No changes made to the medications. 

## 2011-05-13 NOTE — Assessment & Plan Note (Signed)
Recent negative stress test May 2012. No further workup at this time

## 2011-05-13 NOTE — Progress Notes (Signed)
   Patient ID: [name], male    DOB: 30-Sep-1933, 77 y.o.   MRN: [id number]  HPI Comments: Mr. Hern is a very pleasant 77 year old gentleman with a history of coronary artery disease, stent placed to his proximal RCA in September 2007, taxis stent 3.0 x 24 mm, with stress test 08/2010, patient of Dr. [name], Previous syncopal episode, who presents for routine followup after his recent stress test.  He denies any further lightheadedness or dizziness. No near syncope. Etiology of his spell is uncertain. He reports having bilateral leg numbness and was unable to stand. I suspect it could have been secondary to a virus that preceded the episode. It occurred while getting up to go to the bathroom. He made it into the bathroom though passed out. His wife found him and noticed that his eyes were fixed in a stair. He seemed to come too and he crawled back to bed. He does report significant dizziness with standing too quickly from a sitting or lying position.   We had him complete a stress test on Apr 20 2011 which showed no significant ischemia, normal ejection fraction estimated at 69%. There was a very small region of decreased perfusion suggestive of small region of old scar or attenuation artifact in the apical wall.  Overall this was a low risk scan. He was able to perform on the treadmill well with a maximum heart rate of 128 beats per minute, exercised for 8-1/2 minutes, peak blood pressure 194/81.    Old EKG shows normal sinus rhythm with a rate of 55 beats per minute, left axis deviation      Review of Systems  Constitutional: Negative.   HENT: Negative.   Eyes: Negative.   Respiratory: Negative.   Cardiovascular: Negative.   Gastrointestinal: Negative.   Musculoskeletal: Negative.   Skin: Negative.   Neurological: Negative.   Hematological: Negative.   Psychiatric/Behavioral: Negative.   All other systems reviewed and are negative.    BP 152/81  Pulse 58  Ht 5\' 11"  (1.803 m)   Wt 191 lb (86.637 kg)  BMI 26.64 kg/m2   Physical Exam  Nursing note and vitals reviewed. Constitutional: He is oriented to person, place, and time. He appears well-developed and well-nourished.  HENT:  Head: Normocephalic.  Nose: Nose normal.  Mouth/Throat: Oropharynx is clear and moist.  Eyes: Conjunctivae are normal. Pupils are equal, round, and reactive to light.  Neck: Normal range of motion. Neck supple. No JVD present.  Cardiovascular: Normal rate, regular rhythm, S1 normal, S2 normal, normal heart sounds and intact distal pulses.  Exam reveals no gallop and no friction rub.   No murmur heard. Pulmonary/Chest: Effort normal and breath sounds normal. No respiratory distress. He has no wheezes. He has no rales. He exhibits no tenderness.  Abdominal: Soft. Bowel sounds are normal. He exhibits no distension. There is no tenderness.  Musculoskeletal: Normal range of motion. He exhibits no edema and no tenderness.  Lymphadenopathy:    He has no cervical adenopathy.  Neurological: He is alert and oriented to person, place, and time. Coordination normal.  Skin: Skin is warm and dry. No rash noted. No erythema.  Psychiatric: He has a normal mood and affect. His behavior is normal. Judgment and thought content normal.           Assessment and Plan

## 2011-05-13 NOTE — Assessment & Plan Note (Signed)
Etiology of the syncope is concerning for hypertension after recovering from a virus. Recent negative stress test after the episode his low risk. He currently feels well with no further complaints.

## 2011-05-14 ENCOUNTER — Encounter: Payer: Self-pay | Admitting: Cardiovascular Disease

## 2011-11-05 ENCOUNTER — Encounter: Payer: Self-pay | Admitting: Cardiovascular Disease

## 2011-11-05 ENCOUNTER — Ambulatory Visit (INDEPENDENT_AMBULATORY_CARE_PROVIDER_SITE_OTHER): Payer: Medicare Other | Admitting: Cardiovascular Disease

## 2011-11-05 VITALS — BP 132/80 | HR 58 | Ht 70.5 in | Wt 187.5 lb

## 2011-11-05 DIAGNOSIS — I10 Essential (primary) hypertension: Secondary | ICD-10-CM

## 2011-11-05 DIAGNOSIS — E785 Hyperlipidemia, unspecified: Secondary | ICD-10-CM

## 2011-11-05 DIAGNOSIS — I2510 Atherosclerotic heart disease of native coronary artery without angina pectoris: Secondary | ICD-10-CM

## 2011-11-05 DIAGNOSIS — R55 Syncope and collapse: Secondary | ICD-10-CM

## 2011-11-05 NOTE — Assessment & Plan Note (Signed)
Currently with no symptoms of angina. No further workup at this time. Continue current medication regimen. 

## 2011-11-05 NOTE — Patient Instructions (Signed)
You are doing well. No medication changes were made.  Please call us if you have new issues that need to be addressed before your next appt.  The office will contact you for a follow up Appt. In 12 months  

## 2011-11-05 NOTE — Assessment & Plan Note (Signed)
Cholesterol is at goal on the current lipid regimen. No changes to the medications were made.  

## 2011-11-05 NOTE — Assessment & Plan Note (Addendum)
No further episodes. Presumably secondary to hypotension from his blood pressure medication, Possibly in the setting of a virus.

## 2011-11-05 NOTE — Assessment & Plan Note (Signed)
Blood pressure is excellent with no significant medications. We'll continue to monitor

## 2011-11-05 NOTE — Progress Notes (Signed)
Patient ID: [name], male    DOB: Apr 18, 1933, 77 y.o.   MRN: [id number]  HPI Comments: [name] is a very pleasant 77 year old gentleman with a history of coronary artery disease, stent placed to his proximal RCA in September 2007, taxis stent 3.0 x 24 mm, with stress test 08/2010 and May 2012,  patient of Dr. [name], previous syncopal episode (Etiology of his spell is uncertain), who presents for routine followup.  He denies any  lightheadedness or dizziness. No near syncope. He is having problems with significant lower extremity neuropathy. He reports the neurologist felt it was secondary to diabetes. Recent hemoglobin A1c 5.5, fasting glucose 110. He feels well, is exercising and has no complaints After his syncope, his blood pressure medication was held. He has pages of blood pressures all with measurements in the 120-130 systolic range.    stress test on Apr 20 2011 which showed no significant ischemia, normal ejection fraction estimated at 69%. There was a very small region of decreased perfusion suggestive of small region of old scar or attenuation artifact in the apical wall.  Overall this was a low risk scan. He was able to perform on the treadmill well with a maximum heart rate of 128 beats per minute, exercised for 8-1/2 minutes, peak blood pressure 194/81.  Total cholesterol 133, LDL 79, HDL 37    EKG shows normal sinus rhythm with a rate of 58 beats per minute, left axis deviation    Outpatient Encounter Prescriptions as of 11/05/2011  Medication Sig Dispense Refill  . aspirin 81 MG tablet Take 81 mg by mouth daily.        . clopidogrel (PLAVIX) 75 MG tablet Take 75 mg by mouth daily.        . Flaxseed, Linseed, OIL Take by mouth daily.        . folic acid (FOLVITE) 400 MCG tablet Take 400 mcg by mouth daily.        . niacin (NIASPAN) 1000 MG CR tablet Take 1,000 mg by mouth at bedtime.       . pregabalin (LYRICA) 100 MG capsule Take 100 mg by mouth 2 (two) times daily.         . simvastatin (ZOCOR) 80 MG tablet Take 40 mg by mouth at bedtime.        Marland Kitchen terazosin (HYTRIN) 2 MG capsule Take 2 mg by mouth at bedtime.          Review of Systems  Constitutional: Negative.   HENT: Negative.   Eyes: Negative.   Respiratory: Negative.   Cardiovascular: Negative.   Gastrointestinal: Negative.   Musculoskeletal: Negative.   Skin: Negative.   Neurological: Negative.   Hematological: Negative.   Psychiatric/Behavioral: Negative.   All other systems reviewed and are negative.    BP 132/80  Pulse 58  Ht 5' 10.5" (1.791 m)  Wt 187 lb 8 oz (85.049 kg)  BMI 26.52 kg/m2  Physical Exam  Nursing note and vitals reviewed. Constitutional: He is oriented to person, place, and time. He appears well-developed and well-nourished.  HENT:  Head: Normocephalic.  Nose: Nose normal.  Mouth/Throat: Oropharynx is clear and moist.  Eyes: Conjunctivae are normal. Pupils are equal, round, and reactive to light.  Neck: Normal range of motion. Neck supple. No JVD present.  Cardiovascular: Normal rate, regular rhythm, S1 normal, S2 normal, normal heart sounds and intact distal pulses.  Exam reveals no gallop and no friction rub.   No murmur heard. Pulmonary/Chest: Effort  normal and breath sounds normal. No respiratory distress. He has no wheezes. He has no rales. He exhibits no tenderness.  Abdominal: Soft. Bowel sounds are normal. He exhibits no distension. There is no tenderness.  Musculoskeletal: Normal range of motion. He exhibits no edema and no tenderness.  Lymphadenopathy:    He has no cervical adenopathy.  Neurological: He is alert and oriented to person, place, and time. Coordination normal.  Skin: Skin is warm and dry. No rash noted. No erythema.  Psychiatric: He has a normal mood and affect. His behavior is normal. Judgment and thought content normal.           Assessment and Plan

## 2012-03-18 DIAGNOSIS — Z85828 Personal history of other malignant neoplasm of skin: Secondary | ICD-10-CM | POA: Insufficient documentation

## 2012-03-18 DIAGNOSIS — L570 Actinic keratosis: Secondary | ICD-10-CM | POA: Insufficient documentation

## 2012-08-21 ENCOUNTER — Encounter: Payer: Self-pay | Admitting: Cardiovascular Disease

## 2012-08-21 ENCOUNTER — Ambulatory Visit (INDEPENDENT_AMBULATORY_CARE_PROVIDER_SITE_OTHER): Payer: Medicare Other | Admitting: Cardiovascular Disease

## 2012-08-21 VITALS — BP 120/60 | HR 60 | Ht 70.0 in | Wt 187.8 lb

## 2012-08-21 DIAGNOSIS — I2510 Atherosclerotic heart disease of native coronary artery without angina pectoris: Secondary | ICD-10-CM

## 2012-08-21 DIAGNOSIS — E785 Hyperlipidemia, unspecified: Secondary | ICD-10-CM

## 2012-08-21 DIAGNOSIS — I10 Essential (primary) hypertension: Secondary | ICD-10-CM

## 2012-08-21 DIAGNOSIS — R42 Dizziness and giddiness: Secondary | ICD-10-CM

## 2012-08-21 HISTORY — DX: Dizziness and giddiness: R42

## 2012-08-21 NOTE — Progress Notes (Signed)
Patient ID: [name], male    DOB: August 12, 1933, 78 y.o.   MRN: [id number]  HPI Comments: [name] is a very pleasant 78 year old gentleman with a history of coronary artery disease, stent placed to his proximal RCA in September 2007, taxis stent 3.0 x 24 mm, with stress test 08/2010 and May 2012,  patient of Dr. [name], previous syncopal episode (Etiology of his spell is uncertain), who presents for routine followup.  He reports having chronic dizziness in the morning when he sits up in bed. He does report having one episode of significant lightheadedness lasting several minutes while he was shopping for groceries. He held onto the cart and his symptoms resolved. He attributed this episode to low pressure and he seems held his lisinopril. He was previously on lisinopril 5 mg daily.  On the low-dose lisinopril, his blood pressure was relatively well controlled, without the lisinopril, systolic pressure has increased to the 140-150 range. Otherwise he feels well with no complaints. He does report having some bradycardia with heart rates typically in the 50s    stress test on Apr 20 2011 which showed no significant ischemia, normal ejection fraction estimated at 69%. There was a very small region of decreased perfusion suggestive of small region of old scar or attenuation artifact in the apical wall.  Overall this was a low risk scan. He was able to perform on the treadmill well with a maximum heart rate of 128 beats per minute, exercised for 8-1/2 minutes, peak blood pressure 194/81.  Total cholesterol 134, LDL 75, HDL 38    EKG shows normal sinus rhythm with a rate of 58 beats per minute, left axis deviation, poor R-wave progression through the anterior precordial leads    Outpatient Encounter Prescriptions as of 08/21/2012  Medication Sig Dispense Refill  . aspirin 81 MG tablet Take 81 mg by mouth daily.        . clopidogrel (PLAVIX) 75 MG tablet Take 75 mg by mouth daily.        .  Flaxseed, Linseed, OIL Take by mouth daily.        . folic acid (FOLVITE) 400 MCG tablet Take 400 mcg by mouth daily.        . niacin (NIASPAN) 1000 MG CR tablet Take 1,000 mg by mouth at bedtime.       . pregabalin (LYRICA) 100 MG capsule Take 100 mg by mouth 2 (two) times daily.        . simvastatin (ZOCOR) 80 MG tablet Take 40 mg by mouth at bedtime.        Marland Kitchen DISCONTD: terazosin (HYTRIN) 2 MG capsule Take 2 mg by mouth at bedtime.           Review of Systems  Constitutional: Negative.   HENT: Negative.   Eyes: Negative.   Respiratory: Negative.   Cardiovascular: Negative.   Gastrointestinal: Negative.   Musculoskeletal: Negative.   Skin: Negative.   Neurological: Positive for dizziness.  Hematological: Negative.   Psychiatric/Behavioral: Negative.   All other systems reviewed and are negative.    BP 120/60  Pulse 60  Ht 5\' 10"  (1.778 m)  Wt 187 lb 12 oz (85.163 kg)  BMI 26.94 kg/m2  Physical Exam  Nursing note and vitals reviewed. Constitutional: He is oriented to person, place, and time. He appears well-developed and well-nourished.  HENT:  Head: Normocephalic.  Nose: Nose normal.  Mouth/Throat: Oropharynx is clear and moist.  Eyes: Conjunctivae normal are normal. Pupils are equal,  round, and reactive to light.  Neck: Normal range of motion. Neck supple. No JVD present.  Cardiovascular: Normal rate, regular rhythm, S1 normal, S2 normal, normal heart sounds and intact distal pulses.  Exam reveals no gallop and no friction rub.   No murmur heard. Pulmonary/Chest: Effort normal and breath sounds normal. No respiratory distress. He has no wheezes. He has no rales. He exhibits no tenderness.  Abdominal: Soft. Bowel sounds are normal. He exhibits no distension. There is no tenderness.  Musculoskeletal: Normal range of motion. He exhibits no edema and no tenderness.  Lymphadenopathy:    He has no cervical adenopathy.  Neurological: He is alert and oriented to person,  place, and time. Coordination normal.  Skin: Skin is warm and dry. No rash noted. No erythema.  Psychiatric: He has a normal mood and affect. His behavior is normal. Judgment and thought content normal.           Assessment and Plan

## 2012-08-21 NOTE — Assessment & Plan Note (Signed)
Etiology of these dizzy spells are uncertain. He has chronic episodes in the morning for many years. One severe episode while shopping of uncertain etiology. We did offer a two-day Holter to monitor his rhythm. He will call us when he is back from his trip as he is going to see family. We did recommend pulse oximeter for close monitoring of his rhythm and pulse. We also offered 30 day monitor. Symptoms are very rare and we would likely miss his occasional episodes of dizziness.

## 2012-08-21 NOTE — Patient Instructions (Addendum)
You are doing well. Please call when you would like a holter monitor (48  Hour)  Please restart lisinopril 2.5 mg once a day, (1/4 pill) If  Blood pressure continues to run high, take a 1/4 pill twice a day   Please call us if you have new issues that need to be addressed before your next appt.  Your physician wants you to follow-up in: 3 months.  You will receive a reminder letter in the mail two months in advance. If you don't receive a letter, please call our office to schedule the follow-up appointment.

## 2012-08-21 NOTE — Assessment & Plan Note (Signed)
Currently with no symptoms of angina. No further workup at this time. Continue current medication regimen. 

## 2012-08-21 NOTE — Assessment & Plan Note (Signed)
Cholesterol is at goal on the current lipid regimen. No changes to the medications were made.  

## 2012-08-21 NOTE — Assessment & Plan Note (Signed)
Blood pressure is elevated again without lisinopril. We have suggested he start lisinopril 2.5 mg in the morning. If blood pressure continues to be elevated, he could change this to 2.5 g twice a day.

## 2012-09-13 ENCOUNTER — Emergency Department: Payer: Self-pay | Admitting: Emergency Medicine

## 2012-09-13 LAB — CBC WITH DIFFERENTIAL/PLATELET
Basophil #: 0 10*3/uL (ref 0.0–0.1)
Basophil %: 0.1 %
Eosinophil #: 0.1 10*3/uL (ref 0.0–0.7)
Eosinophil %: 0.8 %
HCT: 44.9 % (ref 40.0–52.0)
HGB: 15.7 g/dL (ref 13.0–18.0)
Lymphocyte #: 0.6 10*3/uL — ABNORMAL LOW (ref 1.0–3.6)
Lymphocyte %: 6.3 %
MCH: 34 pg (ref 26.0–34.0)
MCHC: 35.1 g/dL (ref 32.0–36.0)
MCV: 97 fL (ref 80–100)
Monocyte #: 0.3 x10 3/mm (ref 0.2–1.0)
Monocyte %: 3.5 %
Neutrophil #: 8.1 10*3/uL — ABNORMAL HIGH (ref 1.4–6.5)
Neutrophil %: 89.3 %
Platelet: 124 10*3/uL — ABNORMAL LOW (ref 150–440)
RBC: 4.63 10*6/uL (ref 4.40–5.90)
RDW: 13 % (ref 11.5–14.5)
WBC: 9 10*3/uL (ref 3.8–10.6)

## 2012-09-13 LAB — COMPREHENSIVE METABOLIC PANEL
Albumin: 4 g/dL (ref 3.4–5.0)
Alkaline Phosphatase: 127 U/L (ref 50–136)
Anion Gap: 9 (ref 7–16)
BUN: 17 mg/dL (ref 7–18)
Bilirubin,Total: 1.5 mg/dL — ABNORMAL HIGH (ref 0.2–1.0)
Calcium, Total: 8.9 mg/dL (ref 8.5–10.1)
Chloride: 106 mmol/L (ref 98–107)
Co2: 24 mmol/L (ref 21–32)
Creatinine: 0.9 mg/dL (ref 0.60–1.30)
EGFR (African American): >60
EGFR (Non-African Amer.): >60
Glucose: 97 mg/dL (ref 65–99)
Osmolality: 279 (ref 275–301)
Potassium: 4 mmol/L (ref 3.5–5.1)
SGOT(AST): 28 U/L (ref 15–37)
SGPT (ALT): 28 U/L (ref 12–78)
Sodium: 139 mmol/L (ref 136–145)
Total Protein: 6.9 g/dL (ref 6.4–8.2)

## 2012-09-13 LAB — URINALYSIS, COMPLETE
Bilirubin,UR: NEGATIVE
Blood: NEGATIVE
Glucose,UR: NEGATIVE mg/dL (ref 0–75)
Ketone: NEGATIVE
Leukocyte Esterase: NEGATIVE
Nitrite: NEGATIVE
Ph: 5 (ref 4.5–8.0)
Protein: NEGATIVE
RBC,UR: NONE SEEN /HPF (ref 0–5)
Specific Gravity: 1.011 (ref 1.003–1.030)
Squamous Epithelial: NONE SEEN
WBC UR: <1 /HPF (ref 0–5)

## 2012-09-13 LAB — TROPONIN I: Troponin-I: <0.02 ng/mL

## 2012-09-15 LAB — URINE CULTURE

## 2012-09-19 LAB — CULTURE, BLOOD (SINGLE)

## 2012-11-20 ENCOUNTER — Ambulatory Visit (INDEPENDENT_AMBULATORY_CARE_PROVIDER_SITE_OTHER): Payer: Medicare Other | Admitting: Cardiovascular Disease

## 2012-11-20 ENCOUNTER — Encounter: Payer: Self-pay | Admitting: Cardiovascular Disease

## 2012-11-20 VITALS — BP 140/72 | HR 55 | Ht 70.5 in | Wt 181.0 lb

## 2012-11-20 DIAGNOSIS — I10 Essential (primary) hypertension: Secondary | ICD-10-CM

## 2012-11-20 DIAGNOSIS — I2510 Atherosclerotic heart disease of native coronary artery without angina pectoris: Secondary | ICD-10-CM

## 2012-11-20 DIAGNOSIS — R42 Dizziness and giddiness: Secondary | ICD-10-CM

## 2012-11-20 DIAGNOSIS — E785 Hyperlipidemia, unspecified: Secondary | ICD-10-CM

## 2012-11-20 MED ORDER — LISINOPRIL-HYDROCHLOROTHIAZIDE 10-12.5 MG PO TABS: 1.0000 | ORAL_TABLET | Freq: Every day | ORAL | Status: DC

## 2012-11-20 NOTE — Patient Instructions (Addendum)
You are doing well. No medication changes were made.  Please call us if you have new issues that need to be addressed before your next appt.  Your physician wants you to follow-up in: 6 months.  You will receive a reminder letter in the mail two months in advance. If you don't receive a letter, please call our office to schedule the follow-up appointment.   

## 2012-11-20 NOTE — Assessment & Plan Note (Signed)
Chronic issue, orthostatic in nature. No further workup at this time.

## 2012-11-20 NOTE — Assessment & Plan Note (Signed)
Blood pressure is well controlled on today's visit. No changes made to the medications. 

## 2012-11-20 NOTE — Assessment & Plan Note (Signed)
Cholesterol is at goal on the current lipid regimen. No changes to the medications were made.  

## 2012-11-20 NOTE — Progress Notes (Signed)
Patient ID: [name], male    DOB: 09/28/1933, 78 y.o.   MRN: [id number]  HPI Comments: [name] is a very pleasant 78 year old gentleman with a history of coronary artery disease, stent placed to his proximal RCA in September 2007, taxis stent 3.0 x 24 mm, with stress test 08/2010 and May 2012,  patient of Dr. [name], previous syncopal episode (Etiology of his spell is uncertain), who presents for routine followup.  He continues to have  chronic dizziness in the morning when he sits up in bed and sometimes when he stands up weekly. This has been a chronic issue and he has learned to "live with it". No significant progression from previous visits.  Otherwise he feels well with no complaints. He does report having some bradycardia with heart rates typically in the 50s Blood pressure has been well-controlled at home. He takes very low dose of lisinopril HCTZ, cuts them into one quarter of a pill    stress test on Apr 20 2011 which showed no significant ischemia, normal ejection fraction estimated at 69%. There was a very small region of decreased perfusion suggestive of small region of old scar or attenuation artifact in the apical wall.  Overall this was a low risk scan. He was able to perform on the treadmill well with a maximum heart rate of 128 beats per minute, exercised for 8-1/2 minutes, peak blood pressure 194/81.  Total cholesterol 134, LDL 75, HDL 38    EKG shows normal sinus rhythm with a rate of 55 beats per minute, left axis deviation, poor R-wave progression through the anterior precordial leads    Outpatient Encounter Prescriptions as of 11/20/2012  Medication Sig Dispense Refill  . aspirin 81 MG tablet Take 81 mg by mouth daily.        . clopidogrel (PLAVIX) 75 MG tablet Take 75 mg by mouth daily.        . Flaxseed, Linseed, OIL Take 3 g by mouth daily.       . folic acid (FOLVITE) 400 MCG tablet Take 400 mcg by mouth daily.        Marland Kitchen lisinopril-hydrochlorothiazide  (PRINZIDE,ZESTORETIC) 10-12.5 MG per tablet Take 1 tablet by mouth daily. Take 1/4 tablet daily.  90 tablet  3  . niacin (NIASPAN) 1000 MG CR tablet Take 1,000 mg by mouth at bedtime.       . pregabalin (LYRICA) 100 MG capsule Take 100 mg by mouth 2 (two) times daily.        . simvastatin (ZOCOR) 40 MG tablet Take 40 mg by mouth every evening.      . Tamsulosin HCl (FLOMAX) 0.4 MG CAPS Take by mouth.      Marland Kitchen VITAMIN D, ERGOCALCIFEROL, PO Take 1,000 Units by mouth daily.        Review of Systems  Constitutional: Negative.   HENT: Negative.   Eyes: Negative.   Respiratory: Negative.   Cardiovascular: Negative.   Gastrointestinal: Negative.   Musculoskeletal: Negative.   Skin: Negative.   Neurological: Positive for dizziness.  Hematological: Negative.   Psychiatric/Behavioral: Negative.   All other systems reviewed and are negative.    BP 140/72  Pulse 55  Ht 5' 10.5" (1.791 m)  Wt 181 lb (82.101 kg)  BMI 25.60 kg/m2  Physical Exam  Nursing note and vitals reviewed. Constitutional: He is oriented to person, place, and time. He appears well-developed and well-nourished.  HENT:  Head: Normocephalic.  Nose: Nose normal.  Mouth/Throat: Oropharynx is clear  and moist.  Eyes: Conjunctivae normal are normal. Pupils are equal, round, and reactive to light.  Neck: Normal range of motion. Neck supple. No JVD present.  Cardiovascular: Normal rate, regular rhythm, S1 normal, S2 normal, normal heart sounds and intact distal pulses.  Exam reveals no gallop and no friction rub.   No murmur heard. Pulmonary/Chest: Effort normal and breath sounds normal. No respiratory distress. He has no wheezes. He has no rales. He exhibits no tenderness.  Abdominal: Soft. Bowel sounds are normal. He exhibits no distension. There is no tenderness.  Musculoskeletal: Normal range of motion. He exhibits no edema and no tenderness.  Lymphadenopathy:    He has no cervical adenopathy.  Neurological: He is alert  and oriented to person, place, and time. Coordination normal.  Skin: Skin is warm and dry. No rash noted. No erythema.  Psychiatric: He has a normal mood and affect. His behavior is normal. Judgment and thought content normal.           Assessment and Plan

## 2012-11-20 NOTE — Assessment & Plan Note (Signed)
Currently with no symptoms of angina. No further workup at this time. Continue current medication regimen. 

## 2013-01-17 ENCOUNTER — Other Ambulatory Visit: Payer: Self-pay

## 2013-02-25 ENCOUNTER — Ambulatory Visit (INDEPENDENT_AMBULATORY_CARE_PROVIDER_SITE_OTHER): Payer: Medicare Other | Admitting: Physician Assistant

## 2013-02-25 ENCOUNTER — Other Ambulatory Visit: Payer: Self-pay | Admitting: Cardiovascular Disease

## 2013-02-25 ENCOUNTER — Telehealth: Payer: Self-pay

## 2013-02-25 VITALS — BP 130/70 | HR 58 | Ht 70.0 in | Wt 181.2 lb

## 2013-02-25 DIAGNOSIS — I2510 Atherosclerotic heart disease of native coronary artery without angina pectoris: Secondary | ICD-10-CM

## 2013-02-25 DIAGNOSIS — I10 Essential (primary) hypertension: Secondary | ICD-10-CM

## 2013-02-25 DIAGNOSIS — R0789 Other chest pain: Secondary | ICD-10-CM

## 2013-02-25 DIAGNOSIS — E785 Hyperlipidemia, unspecified: Secondary | ICD-10-CM

## 2013-02-25 LAB — CBC WITH DIFFERENTIAL/PLATELET
Basophil #: 0 10*3/uL (ref 0.0–0.1)
Basophil %: 0.5 %
Eosinophil #: 0.2 10*3/uL (ref 0.0–0.7)
Eosinophil %: 3.9 %
HCT: 44.3 % (ref 40.0–52.0)
HGB: 14.9 g/dL (ref 13.0–18.0)
Lymphocyte #: 1.2 10*3/uL (ref 1.0–3.6)
Lymphocyte %: 18.9 %
MCH: 32.3 pg (ref 26.0–34.0)
MCHC: 33.7 g/dL (ref 32.0–36.0)
MCV: 96 fL (ref 80–100)
Monocyte #: 0.7 x10 3/mm (ref 0.2–1.0)
Monocyte %: 10.3 %
Neutrophil #: 4.2 10*3/uL (ref 1.4–6.5)
Neutrophil %: 66.4 %
Platelet: 136 10*3/uL — ABNORMAL LOW (ref 150–440)
RBC: 4.62 10*6/uL (ref 4.40–5.90)
RDW: 13.4 % (ref 11.5–14.5)
WBC: 6.3 10*3/uL (ref 3.8–10.6)

## 2013-02-25 LAB — BASIC METABOLIC PANEL
Anion Gap: 4 — ABNORMAL LOW (ref 7–16)
BUN: 23 mg/dL — ABNORMAL HIGH (ref 7–18)
Calcium, Total: 8.5 mg/dL (ref 8.5–10.1)
Chloride: 108 mmol/L — ABNORMAL HIGH (ref 98–107)
Co2: 30 mmol/L (ref 21–32)
Creatinine: 0.89 mg/dL (ref 0.60–1.30)
EGFR (African American): >60
EGFR (Non-African Amer.): >60
Glucose: 92 mg/dL (ref 65–99)
Osmolality: 286 (ref 275–301)
Potassium: 3.8 mmol/L (ref 3.5–5.1)
Sodium: 142 mmol/L (ref 136–145)

## 2013-02-25 LAB — TROPONIN I: Troponin-I: <0.02 ng/mL

## 2013-02-25 MED ORDER — NITROGLYCERIN 0.4 MG SL SUBL: 0.4000 mg | SUBLINGUAL_TABLET | SUBLINGUAL | Status: DC | PRN

## 2013-02-25 NOTE — Patient Instructions (Addendum)
Please take nitroglycerin sublingual tablets as prescribed.   Please await results for lab work.   We will schedule a stress test to be performed. The nurse will call you with a date & time when this can be scheduled.   [facility] MYOVIEW  Your caregiver has ordered a Stress Test with nuclear imaging. The purpose of this test is to evaluate the blood supply to your heart muscle. This procedure is referred to as a "Non-Invasive Stress Test." This is because other than having an IV started in your vein, nothing is inserted or "invades" your body. Cardiac stress tests are done to find areas of poor blood flow to the heart by determining the extent of coronary artery disease (CAD). Some patients exercise on a treadmill, which naturally increases the blood flow to your heart, while others who are  unable to walk on a treadmill due to physical limitations have a pharmacologic/chemical stress agent called Lexiscan . This medicine will mimic walking on a treadmill by temporarily increasing your coronary blood flow.   Please note: these test may take anywhere between 2-4 hours to complete  PLEASE REPORT TO [facility] ENTRANCE  THE VOLUNTEERS AT THE FIRST DESK WILL DIRECT YOU WHERE TO GO  Date of Procedure:_____________3/27________________________  Arrival Time for Procedure:______7:45 am________________________  Instructions regarding medication:   __n/a__ : Hold diabetes medication morning of procedure  __n/a__:  Hold betablocker(s) night before procedure and morning of procedure  _n/a___:  Hold other medications as follows:_________________________________________________________________________________________________________________________________________________________________________________________________________________________________________________________________________________________  PLEASE NOTIFY THE OFFICE AT LEAST 24 HOURS IN ADVANCE IF YOU ARE UNABLE TO KEEP YOUR  APPOINTMENT.  [phone]  How to prepare for your Myoview test:  1. Do not eat or drink after midnight 2. No caffeine for 24 hours prior to test 3. Your medication may be taken with water.  If your doctor stopped a medication because of this test, do not take that medication. 4. Ladies, please do not wear dresses.  Skirts or pants are appropriate. Please wear a short sleeve shirt. 5. No perfume, cologne or lotion. 6. Wear comfortable walking shoes. No heels!

## 2013-02-25 NOTE — Assessment & Plan Note (Addendum)
Continue ASA/Plavix, ACEi, statin. Have prescribed NTG SL PRN. Hold off on BB with sinus bradycardia, 1st degree AVB.

## 2013-02-25 NOTE — Assessment & Plan Note (Signed)
Patient's symptoms contain both typical and atypical features for ACS. He did present atypically prior to PCI in 2007. Given his cardiac history, family history of CAD, cardiac risk factors and overall preference for aggressive evaluation, will plan to check BMET, CBC and troponin-I today. Will schedule an exercise Myoview prior to his trip. Will also provide with NTG SL PRN. Have given instructions on further plans (ie should troponin return elevated or if stress test is abnormal) as well. He will be informed of lab results and stress test date & time.

## 2013-02-25 NOTE — Assessment & Plan Note (Signed)
Continue statin. 

## 2013-02-25 NOTE — Progress Notes (Signed)
Pt c/o indigestion symptoms x 2 days Some relief with Zantac This am began having chest tightness Was unable to exercise as usual d/t fatigue C/o inability to "get a good breath" at times  Has hx stent in 2007 Asymptomatic prior to stent Will review EKG and situation with PA

## 2013-02-25 NOTE — Progress Notes (Signed)
Patient ID: [name], male    DOB: 1933/07/12, 79 y.o.   MRN: [id number]  HPI Comments: [name] is a very pleasant 79 year old gentleman with a history of CAD s/p DES-prox RCA in 08/2006, taxis stent 3.0 x 24 mm, with stress test 08/2010 and May 2012,  patient of Dr. [name], previous syncopal episode (Etiology of his spell is uncertain) and h/o TIA, who presents as a walk-in visit for chest pain.   Stress test on Apr 20 2011 which showed no significant ischemia, normal ejection fraction estimated at 69%. There was a very small region of decreased perfusion suggestive of small region of old scar or attenuation artifact in the apical wall.  Overall this was a low risk scan. He was able to perform on the treadmill well with a maximum heart rate of 128 beats per minute, exercised for 8-1/2 minutes, peak blood pressure 194/81.  EKG shows sinus bradycardia with a rate of 58 beats per minute, poor R-wave progression through the anterior precordial leads, no ST/T changes.   He presents today complaining of worsening chest tightness since Monday. He is still fairly active and able to exercise on treadmill without incident. He apparently had a great work-out on Saturday without any issues. He has reports experiencing chest tightness which he attributed to indigestion mostly at rest and unrelieved by Zantac. No NTG at home. No relation to exertion. No radiation. He denies chest discomfort prior to PCI- ischemia found on stress testing. He does note associated shortness of breath with these episodes, which have since become more frequent and severe. This morning, he attempted to use the treadmill, but was limited fatigue and lack of energy. No exertional discomfort, but he did experience two episodes of chest tightness at rest. He called the office and was advised to come in for EKG review. As above, this reveals no evidence of ischemia. [place], and are concerned about his  symptoms.    Outpatient Encounter Prescriptions as of 02/25/2013  Medication Sig Dispense Refill  . aspirin 81 MG tablet Take 81 mg by mouth daily.        . clopidogrel (PLAVIX) 75 MG tablet Take 75 mg by mouth daily.        . Flaxseed, Linseed, OIL Take 3 g by mouth daily.       . folic acid (FOLVITE) 400 MCG tablet Take 400 mcg by mouth daily.        Marland Kitchen lisinopril-hydrochlorothiazide (PRINZIDE,ZESTORETIC) 10-12.5 MG per tablet Take 0.25 tablets by mouth daily. Take 1/4 tablet daily.      . niacin (NIASPAN) 1000 MG CR tablet Take 1,000 mg by mouth at bedtime.       . pregabalin (LYRICA) 100 MG capsule Take 100 mg by mouth 2 (two) times daily.        . simvastatin (ZOCOR) 40 MG tablet Take 20 mg by mouth every evening.       . Tamsulosin HCl (FLOMAX) 0.4 MG CAPS Take by mouth.      Marland Kitchen VITAMIN D, ERGOCALCIFEROL, PO Take 1,000 Units by mouth daily.       No facility-administered encounter medications on file as of 02/25/2013.    Review of Systems  Constitutional: Positive for activity change and fatigue.  HENT: Negative.  Negative for neck pain.   Eyes: Negative.   Respiratory: Positive for chest tightness and shortness of breath.   Cardiovascular: Positive for chest pain. Negative for palpitations and  leg swelling.  Gastrointestinal: Negative.   Musculoskeletal: Negative.   Skin: Negative.   Neurological: Positive for dizziness.  Psychiatric/Behavioral: Negative.   All other systems reviewed and are negative.    BP 130/70  Pulse 58  Ht 5\' 10"  (1.778 m)  Wt 181 lb 4 oz (82.214 kg)  BMI 26.01 kg/m2  Physical Exam  Nursing note and vitals reviewed. Constitutional: He is oriented to person, place, and time. He appears well-developed and well-nourished.  HENT:  Head: Normocephalic.  Nose: Nose normal.  Mouth/Throat: Oropharynx is clear and moist.  Eyes: Conjunctivae are normal. Pupils are equal, round, and reactive to light.  Neck: Normal range of motion. Neck supple. No JVD  present.  Cardiovascular: Normal rate, regular rhythm, S1 normal, S2 normal, normal heart sounds and intact distal pulses.  Exam reveals no gallop and no friction rub.   No murmur heard. Pulmonary/Chest: Effort normal and breath sounds normal. No respiratory distress. He has no wheezes. He has no rales. He exhibits no tenderness.  Abdominal: Soft. Bowel sounds are normal. He exhibits no distension. There is no tenderness.  Musculoskeletal: Normal range of motion. He exhibits no edema and no tenderness.  Lymphadenopathy:    He has no cervical adenopathy.  Neurological: He is alert and oriented to person, place, and time. Coordination normal.  Skin: Skin is warm and dry. No rash noted. No erythema.  Psychiatric: He has a normal mood and affect. His behavior is normal. Judgment and thought content normal.      Assessment and Plan

## 2013-02-25 NOTE — Telephone Encounter (Signed)
Wife calling to say pt has been c/o "indigestion" x 2 days and now today he is c/o some chest tightness Took zantac yesterday with some relief Does not have NTG, has not taken Ibuprofen/Tylenol for tightness Had stent placed in 2007 but was asymptomatic prior to this Denies sob Wife asks if Dr. Mariah Milling can work pt in today I had her hold while I discussed with PA He suggests have pt come in today for EKG and possible appt Wife informed Coming in today at 2 pm Will have pt go to ER prior to appt if symptoms get worse She will also have pt try tylenol/ibuprofen for discomfort to see if this helps

## 2013-02-25 NOTE — Assessment & Plan Note (Signed)
Well-controlled today. Continue current antihypertensives. 

## 2013-02-26 ENCOUNTER — Ambulatory Visit: Payer: Self-pay | Admitting: Cardiovascular Disease

## 2013-02-26 DIAGNOSIS — R079 Chest pain, unspecified: Secondary | ICD-10-CM

## 2013-02-27 ENCOUNTER — Telehealth: Payer: Self-pay

## 2013-02-27 NOTE — Telephone Encounter (Signed)
Pt called to say [place] is [place] way [place] He has been having chest tightness all morning but also began having chest pains as well that were "worse than they were the other day" He took NTG SL x1 and got relief Denies SOB Had stress myoview yesterday but has not heard results He feels ok now and continues to drive out of town with spouse I told him I would have Dr. Mariah Milling look at nuclear portion of test and will also make him aware of symptoms today and call him back Understanding verb

## 2013-02-27 NOTE — Telephone Encounter (Signed)
I was able to speak with Dr. Mariah Milling about pt's symptoms He says he was able to read nuclear portion of stress test and it showed "a very small area of little concern near the apex", says he is having nuclear dept reprocess these pictures because he "does not think these images are accurate" Says if pt having symptoms at rest, he is reassured these symptoms are non cardiac, especially since pt was asymptomatic during exercise yesterday during test He asks that I advise pt to continue taking SL NTG PRN chest discomfort while out of town, he will re-read nuclear images, and he would like [place] to reassess pt's symptoms Monday 3/31  Pt was reassured and advised to take NTG SL PRN chest tightness and to call us over w/e if needed Otherwise we will call him to reassess Monday 3/31

## 2013-02-27 NOTE — Telephone Encounter (Signed)
Pt called, states he has treadmill test yesterday, states this a.m. He had more chest pain, more so than last time. Took Nytro at 12:00. Feels much better now.

## 2013-03-02 ENCOUNTER — Telehealth: Payer: Self-pay

## 2013-03-02 ENCOUNTER — Other Ambulatory Visit: Payer: Self-pay | Admitting: *Deleted

## 2013-03-02 DIAGNOSIS — R0789 Other chest pain: Secondary | ICD-10-CM

## 2013-03-02 NOTE — Telephone Encounter (Signed)
"  Nuclear images were reprocessed and are still showing a small abnormality at base of heart.  If patient still having symptoms, we should cath him.  If he ha shad no more symptoms then have pt monitor his symptoms and call us if these get worse." VO Dr. Alvis Lemmings, RN  I was able to speak with pt's wife She says pt had no more chest discomfort after Friday's conversation. He has left this am to go to gym.  She will have pt notify us should his symptoms return and we will discuss cath Otherwise he will continue to monitor symptoms

## 2013-07-08 ENCOUNTER — Other Ambulatory Visit: Payer: Self-pay

## 2013-07-13 ENCOUNTER — Encounter: Payer: Self-pay | Admitting: Cardiovascular Disease

## 2013-07-13 ENCOUNTER — Ambulatory Visit (INDEPENDENT_AMBULATORY_CARE_PROVIDER_SITE_OTHER): Payer: Medicare Other | Admitting: Cardiovascular Disease

## 2013-07-13 VITALS — BP 120/68 | HR 59 | Ht 70.0 in | Wt 186.5 lb

## 2013-07-13 DIAGNOSIS — I2510 Atherosclerotic heart disease of native coronary artery without angina pectoris: Secondary | ICD-10-CM

## 2013-07-13 DIAGNOSIS — I10 Essential (primary) hypertension: Secondary | ICD-10-CM

## 2013-07-13 DIAGNOSIS — R42 Dizziness and giddiness: Secondary | ICD-10-CM

## 2013-07-13 DIAGNOSIS — E785 Hyperlipidemia, unspecified: Secondary | ICD-10-CM

## 2013-07-13 NOTE — Patient Instructions (Addendum)
You are doing well. No medication changes were made.  Please call us if you have new issues that need to be addressed before your next appt.  Your physician wants you to follow-up in: 12 months.  You will receive a reminder letter in the mail two months in advance. If you don't receive a letter, please call our office to schedule the follow-up appointment. 

## 2013-07-13 NOTE — Assessment & Plan Note (Signed)
Blood pressure is well controlled on today's visit. No changes made to the medications. 

## 2013-07-13 NOTE — Assessment & Plan Note (Signed)
Suspect dizziness is from in inner ear. Not orthostatic today, had dizzy symptoms sitting up with systolic pressure in the 130 range, heart rates in the 60s.

## 2013-07-13 NOTE — Progress Notes (Signed)
Patient ID: GORDIE CRUMBY, male    DOB: [birth year], 79 y.o.   MRN: [id number]  HPI Comments: Mr. Chenault is a very pleasant 79 year old gentleman with a history of coronary artery disease, stent placed to his proximal RCA in September 2007, taxis stent 3.0 x 24 mm, with stress test 08/2010 and May 2012,  patient of Dr. [name], previous syncopal episode (Etiology of his spell is uncertain), who presents for routine followup.  He had a nuclear Myoview earlier in 2014 that showed no ischemia.  He continues to have chronic dizziness when he changes position. This has been a chronic issue and he has learned to "live with it". No significant progression from previous visits.  Otherwise he feels well with no complaints. Blood pressure has been well-controlled at home.     stress test on Apr 20 2011 which showed no significant ischemia, normal ejection fraction estimated at 69%. There was a very small region of decreased perfusion suggestive of small region of old scar or attenuation artifact in the apical wall.  Overall this was a low risk scan. He was able to perform on the treadmill well with a maximum heart rate of 128 beats per minute, exercised for 8-1/2 minutes, peak blood pressure 194/81.  Lipids are typically in And aggressively low    EKG shows normal sinus rhythm with a rate of 59 beats per minute, left axis deviation, poor R-wave progression through the anterior precordial leads    Outpatient Encounter Prescriptions as of 07/13/2013  Medication Sig Dispense Refill  . aspirin 81 MG tablet Take 81 mg by mouth daily.        . clopidogrel (PLAVIX) 75 MG tablet Take 75 mg by mouth daily.        . Flaxseed, Linseed, OIL Take 3 g by mouth daily.       . folic acid (FOLVITE) 800 MCG tablet Take 800 mcg by mouth daily.       Marland Kitchen lisinopril-hydrochlorothiazide (PRINZIDE,ZESTORETIC) 10-12.5 MG per tablet Take 0.25 tablets by mouth daily. Take 1/4 tablet daily.      . niacin 500 MG tablet Take 1,000  mg by mouth daily with breakfast.      . nitroGLYCERIN (NITROSTAT) 0.4 MG SL tablet Place 1 tablet (0.4 mg total) under the tongue every 5 (five) minutes as needed for chest pain.  25 tablet  3  . pregabalin (LYRICA) 100 MG capsule Take 100 mg by mouth 2 (two) times daily.        . Psyllium (METAMUCIL PO) Take by mouth 2 (two) times daily.      . simvastatin (ZOCOR) 40 MG tablet Take 20 mg by mouth every evening.       . Tamsulosin HCl (FLOMAX) 0.4 MG CAPS Take by mouth.      Marland Kitchen VITAMIN D, ERGOCALCIFEROL, PO Take 1,000 Units by mouth 2 (two) times daily.       . [DISCONTINUED] folic acid (FOLVITE) 400 MCG tablet Take 400 mcg by mouth daily.        . [DISCONTINUED] niacin (NIASPAN) 1000 MG CR tablet Take 1,000 mg by mouth at bedtime.        No facility-administered encounter medications on file as of 07/13/2013.     Review of Systems  Constitutional: Negative.   HENT: Negative.   Eyes: Negative.   Respiratory: Negative.   Cardiovascular: Negative.   Gastrointestinal: Negative.   Musculoskeletal: Negative.   Skin: Negative.   Neurological: Positive for dizziness.  Psychiatric/Behavioral: Negative.  All other systems reviewed and are negative.    BP 120/68  Pulse 59  Ht 5\' 10"  (1.778 m)  Wt 186 lb 8 oz (84.596 kg)  BMI 26.76 kg/m2  Physical Exam  Nursing note and vitals reviewed. Constitutional: He is oriented to person, place, and time. He appears well-developed and well-nourished.  HENT:  Head: Normocephalic.  Nose: Nose normal.  Mouth/Throat: Oropharynx is clear and moist.  Eyes: Conjunctivae are normal. Pupils are equal, round, and reactive to light.  Neck: Normal range of motion. Neck supple. No JVD present.  Cardiovascular: Normal rate, regular rhythm, S1 normal, S2 normal, normal heart sounds and intact distal pulses.  Exam reveals no gallop and no friction rub.   No murmur heard. Pulmonary/Chest: Effort normal and breath sounds normal. No respiratory distress. He has  no wheezes. He has no rales. He exhibits no tenderness.  Abdominal: Soft. Bowel sounds are normal. He exhibits no distension. There is no tenderness.  Musculoskeletal: Normal range of motion. He exhibits no edema and no tenderness.  Lymphadenopathy:    He has no cervical adenopathy.  Neurological: He is alert and oriented to person, place, and time. Coordination normal.  Skin: Skin is warm and dry. No rash noted. No erythema.  Psychiatric: He has a normal mood and affect. His behavior is normal. Judgment and thought content normal.      Assessment and Plan

## 2013-07-13 NOTE — Assessment & Plan Note (Signed)
Currently with no symptoms of angina. No further workup at this time. Continue current medication regimen. Recent negative stress test early 2014

## 2013-07-13 NOTE — Assessment & Plan Note (Signed)
Cholesterol is at goal on the current lipid regimen. No changes to the medications were made.  

## 2013-09-21 ENCOUNTER — Telehealth: Payer: Self-pay

## 2013-09-21 NOTE — Telephone Encounter (Signed)
Spoke w/ pt's wife.  She states that pt is sched to have colonoscopy w/ Dr. Servando Snare 10/04/13. Dr. Annabell Sabal office instructed pt to hold plavix & aspirin x 5 days before procedure, but pt wants to make sure this is okay w/ Dr. Mariah Milling. She also wanted to mention that pt bruises and bleeds easily. Please advise.  Thank you.

## 2013-09-21 NOTE — Telephone Encounter (Signed)
Would hold Plavix 5 days before procedure Typically do not need to stop aspirin 81 mg daily even for the procedure

## 2013-09-21 NOTE — Telephone Encounter (Signed)
Pt wife called and states he is having a colonoscopy on 10/04/2013. Was informed to stop his aspirin and plavix, please advise

## 2013-09-22 NOTE — Telephone Encounter (Signed)
lmom for pt to call back

## 2013-09-22 NOTE — Telephone Encounter (Signed)
Spoke w/ pt's wife.  She is agreeable to plan.

## 2013-10-08 ENCOUNTER — Other Ambulatory Visit: Payer: Self-pay

## 2013-10-20 ENCOUNTER — Telehealth: Payer: Self-pay

## 2013-10-20 NOTE — Telephone Encounter (Signed)
I talked with patients wife

## 2013-10-20 NOTE — Telephone Encounter (Signed)
Pt is having colonoscopy on Dec 30, has to stop Plavix on 25, states pt is flying home from [place] on the 26th, wife is concerned that flying will cause blood clots, please advise if procedure should be postponed.

## 2013-12-01 ENCOUNTER — Ambulatory Visit: Payer: Self-pay | Admitting: Gastroenterology

## 2014-01-25 ENCOUNTER — Telehealth: Payer: Self-pay | Admitting: *Deleted

## 2014-01-25 ENCOUNTER — Encounter: Payer: Self-pay | Admitting: Cardiovascular Disease

## 2014-01-25 ENCOUNTER — Telehealth: Payer: Self-pay

## 2014-01-25 ENCOUNTER — Ambulatory Visit (INDEPENDENT_AMBULATORY_CARE_PROVIDER_SITE_OTHER): Payer: Medicare Other | Admitting: Cardiovascular Disease

## 2014-01-25 VITALS — BP 160/80 | HR 64 | Ht 70.5 in | Wt 188.0 lb

## 2014-01-25 DIAGNOSIS — I2510 Atherosclerotic heart disease of native coronary artery without angina pectoris: Secondary | ICD-10-CM

## 2014-01-25 DIAGNOSIS — I10 Essential (primary) hypertension: Secondary | ICD-10-CM

## 2014-01-25 DIAGNOSIS — R55 Syncope and collapse: Secondary | ICD-10-CM

## 2014-01-25 DIAGNOSIS — E785 Hyperlipidemia, unspecified: Secondary | ICD-10-CM

## 2014-01-25 DIAGNOSIS — R42 Dizziness and giddiness: Secondary | ICD-10-CM

## 2014-01-25 NOTE — Assessment & Plan Note (Signed)
Chronic dizziness, likely from middle ear.

## 2014-01-25 NOTE — Telephone Encounter (Signed)
lmom for pt to schedule 1 month f/u appt. With dr Mariah MillingGollan.

## 2014-01-25 NOTE — Assessment & Plan Note (Signed)
No recent episodes of syncope or near syncope. No further workup. We'll try not to push his blood pressure aggressively low to avoid symptoms

## 2014-01-25 NOTE — Assessment & Plan Note (Signed)
Blood pressures running high. We have suggested he take a full lisinopril HCTZ 10/12.5 mg daily Recommended he monitor his blood pressure and call us in one week with his numbers

## 2014-01-25 NOTE — Telephone Encounter (Signed)
Spoke w/ [name].  He reports that he has not check his BP in a while, but since last week it has been elevated and "gone crazy". 157/53  Thurs 162/69  Fri 180/70  [facility] 170/74  Sat 151/73 after working out 163/84 later in the day 180/69 Sun 174/75 sitting doing nothing [name] states this is new onset and would like to see Dr. Mariah MillingGollan.  [name] sched to come in this afternoon at 2:15.

## 2014-01-25 NOTE — Progress Notes (Signed)
Patient ID: [name], male    DOB: December 26, 1932, 80 y.o.   MRN: [id number]  HPI Comments: [name] is a very pleasant 80 year-old gentleman with a history of coronary artery disease, stent placed to his proximal RCA in September 2007, taxis stent 3.0 x 24 mm, with stress test 08/2010 and May 2012,  patient of [name], previous syncopal episode (Etiology of his spell is uncertain), who presents for routine followup.  He had a nuclear Myoview earlier in 2014 that showed no ischemia.  He has chronic dizziness when he changes position. This has been a chronic issue and he has learned to "live with it". No significant progression from previous visits.  Blood pressure was previously well controlled on today's visit he reports over the past week or so, systolic pressures have been in the 160-170 range. Continues to work out uses a treadmill 3-4 times per week, a bike one time per week. He denies any shortness of breath or chest pain with exertion. He was previously taking one quarter of a pill of lisinopril HCTZ 10/12.5 mg daily. Recently been taking one half pill. Now he feels that he must take a full pill    stress test on Apr 20 2011 which showed no significant ischemia, normal ejection fraction estimated at 69%. There was a very small region of decreased perfusion suggestive of small region of old scar or attenuation artifact in the apical wall.  Overall this was a low risk scan. He was able to perform on the treadmill well with a maximum heart rate of 128 beats per minute, exercised for 8-1/2 minutes, peak blood pressure 194/81.    Outpatient Encounter Prescriptions as of 01/25/2014  Medication Sig  . aspirin 81 MG tablet Take 81 mg by mouth daily.    . clopidogrel (PLAVIX) 75 MG tablet Take 75 mg by mouth daily.    . Flaxseed, Linseed, OIL Take 3 g by mouth daily.   . folic acid (FOLVITE) 800 MCG tablet Take 800 mcg by mouth daily.   Marland Kitchen. lisinopril-hydrochlorothiazide (PRINZIDE,ZESTORETIC)  10-12.5 MG per tablet Take 1/2 tablet daily.  . niacin 500 MG tablet Take 1,000 mg by mouth daily with breakfast.  . nitroGLYCERIN (NITROSTAT) 0.4 MG SL tablet Place 1 tablet (0.4 mg total) under the tongue every 5 (five) minutes as needed for chest pain.  . pregabalin (LYRICA) 100 MG capsule Take 100 mg by mouth 2 (two) times daily.    . simvastatin (ZOCOR) 40 MG tablet Take 20 mg by mouth every evening.   . Tamsulosin HCl (FLOMAX) 0.4 MG CAPS Take by mouth.  Marland Kitchen. VITAMIN D, ERGOCALCIFEROL, PO Take 1,000 Units by mouth 2 (two) times daily.   . [DISCONTINUED] Psyllium (METAMUCIL PO) Take by mouth 2 (two) times daily.     Review of Systems  Constitutional: Negative.   HENT: Negative.   Eyes: Negative.   Respiratory: Negative.   Cardiovascular: Negative.   Gastrointestinal: Negative.   Musculoskeletal: Negative.   Skin: Negative.   Neurological: Positive for dizziness.  Psychiatric/Behavioral: Negative.   All other systems reviewed and are negative.    BP 160/80  Pulse 64  Ht 5' 10.5" (1.791 m)  Wt 188 lb (85.276 kg)  BMI 26.58 kg/m2  Physical Exam  Nursing note and vitals reviewed. Constitutional: He is oriented to person, place, and time. He appears well-developed and well-nourished.  HENT:  Head: Normocephalic.  Nose: Nose normal.  Mouth/Throat: Oropharynx is clear and moist.  Eyes: Conjunctivae are  normal. Pupils are equal, round, and reactive to light.  Neck: Normal range of motion. Neck supple. No JVD present.  Cardiovascular: Normal rate, regular rhythm, S1 normal, S2 normal, normal heart sounds and intact distal pulses.  Exam reveals no gallop and no friction rub.   No murmur heard. Pulmonary/Chest: Effort normal and breath sounds normal. No respiratory distress. He has no wheezes. He has no rales. He exhibits no tenderness.  Abdominal: Soft. Bowel sounds are normal. He exhibits no distension. There is no tenderness.  Musculoskeletal: Normal range of motion. He  exhibits no edema and no tenderness.  Lymphadenopathy:    He has no cervical adenopathy.  Neurological: He is alert and oriented to person, place, and time. Coordination normal.  Skin: Skin is warm and dry. No rash noted. No erythema.  Psychiatric: He has a normal mood and affect. His behavior is normal. Judgment and thought content normal.      Assessment and Plan

## 2014-01-25 NOTE — Telephone Encounter (Signed)
Patient called and bp is running from 150 to 180.  Took bp  At 1:00 pm and it was 180/72. Please advise

## 2014-01-25 NOTE — Assessment & Plan Note (Signed)
Currently with no symptoms of angina. No further workup at this time. Continue current medication regimen. 

## 2014-01-25 NOTE — Assessment & Plan Note (Signed)
Cholesterol is at goal on the current lipid regimen. No changes to the medications were made.  

## 2014-01-25 NOTE — Patient Instructions (Signed)
You are doing well. Please increase the lisinopril HCTZ to a full pill  Please call us if you have new issues that need to be addressed before your next appt.  Your physician wants you to follow-up in: 1 month.

## 2014-02-10 ENCOUNTER — Other Ambulatory Visit: Payer: Self-pay

## 2014-02-10 MED ORDER — LISINOPRIL-HYDROCHLOROTHIAZIDE 10-12.5 MG PO TABS: 1.0000 | ORAL_TABLET | Freq: Every day | ORAL | Status: DC

## 2014-02-10 NOTE — Telephone Encounter (Signed)
Refill Lisinopril-HCTZ 10/12.5 mg take one tablet daily.

## 2014-02-13 ENCOUNTER — Emergency Department: Payer: Self-pay | Admitting: Emergency Medicine

## 2014-02-15 ENCOUNTER — Telehealth: Payer: Self-pay

## 2014-02-15 NOTE — Telephone Encounter (Signed)
Pt called to let us know that his BP has been running 135-155/65-70 since his last visit. Reports that he is feeling good.

## 2014-02-15 NOTE — Telephone Encounter (Signed)
Pt wife called and states pt had a severe nosebleed, Dr. Linus Salmonshapman McQueen would like for pt to stop Plavix and aspirin until next Monday, 02/22/2014, but wanted Dr. Windell HummingbirdGollan's ok. Please call.

## 2014-02-15 NOTE — Telephone Encounter (Signed)
Spoke w/ pt.  Advised him that I had spoken w/ Dr. Mariah MillingGollan and he suggested that pt hold plavix until nosebleed stops, but continue on baby asa. Advised him to try to limit blowing his nose, use saline spray and try to use a humidifier. Pt reports that "we are hooking it up now". Pt is agreeable to this and will call w/ further questions or concerns.

## 2014-05-03 ENCOUNTER — Encounter: Payer: Self-pay | Admitting: Podiatry

## 2014-05-03 ENCOUNTER — Ambulatory Visit (INDEPENDENT_AMBULATORY_CARE_PROVIDER_SITE_OTHER): Payer: Medicare Other

## 2014-05-03 ENCOUNTER — Ambulatory Visit (INDEPENDENT_AMBULATORY_CARE_PROVIDER_SITE_OTHER): Payer: Medicare Other | Admitting: Podiatry

## 2014-05-03 VITALS — BP 124/64 | HR 59 | Resp 16 | Ht 70.0 in | Wt 180.0 lb

## 2014-05-03 DIAGNOSIS — S92919A Unspecified fracture of unspecified toe(s), initial encounter for closed fracture: Secondary | ICD-10-CM

## 2014-05-03 DIAGNOSIS — M2040 Other hammer toe(s) (acquired), unspecified foot: Secondary | ICD-10-CM

## 2014-05-03 DIAGNOSIS — I2510 Atherosclerotic heart disease of native coronary artery without angina pectoris: Secondary | ICD-10-CM

## 2014-05-03 NOTE — Progress Notes (Signed)
   Subjective:    Patient ID: [name], male    DOB: Apr 11, 1933, 80 y.o.   MRN: [id number]  HPI Comments: i have a big red toe on my 2nd toe left foot. Its been like this for 3 weeks. The color has gotten better the size is about the same. i havent done anything for my foot. The toe does not hurt.  Foot Pain      Review of Systems  HENT: Positive for sneezing.   Musculoskeletal:       Back pain   Neurological: Positive for light-headedness.  Hematological: Bruises/bleeds easily.  All other systems reviewed and are negative.      Objective:   Physical Exam        Assessment & Plan:

## 2014-05-05 NOTE — Progress Notes (Signed)
Subjective:     Patient ID: [name], male   DOB: 1933/06/17, 80 y.o.   MRN: [id number]  Foot Pain   patient states I have a swollen left second toe that I'm not sure if I may have injured. I don't feel my feet so it's possible that that happened   Review of Systems  All other systems reviewed and are negative.      Objective:   Physical Exam  Nursing note and vitals reviewed. Constitutional: He is oriented to person, place, and time.  Cardiovascular: Intact distal pulses.   Musculoskeletal: Normal range of motion.  Neurological: He is oriented to person, place, and time.  Skin: Skin is warm.   neurological sensation dramatically diminished with both sharp tall and vibratory been absent in both feet. Patient is found to have good digital perfusion and is noted to have a well-developed arch upon weight bearing. Range of motion reduced in the subtalar midtarsal joint muscle strength adequate and I noted second digit left is red and swollen in the entire set toe itself with no indications of breaking skin or drainage     Assessment:     Probable trauma to the second toe left versus systemic issues versus infection    Plan:     H&P and x-ray reviewed and blood work reviewed. He did fracture the second toe did not realize it secondary to his neuropathy and that it's causing the redness and swelling. This should gradually get better but will probably take 12 weeks

## 2014-05-07 ENCOUNTER — Ambulatory Visit: Payer: Self-pay | Admitting: Podiatry

## 2014-07-03 ENCOUNTER — Ambulatory Visit: Payer: Self-pay | Admitting: Otolaryngology

## 2014-07-20 ENCOUNTER — Ambulatory Visit (INDEPENDENT_AMBULATORY_CARE_PROVIDER_SITE_OTHER): Payer: Medicare Other | Admitting: Cardiovascular Disease

## 2014-07-20 ENCOUNTER — Encounter: Payer: Self-pay | Admitting: Cardiovascular Disease

## 2014-07-20 VITALS — BP 132/80 | HR 55 | Ht 70.5 in | Wt 185.5 lb

## 2014-07-20 DIAGNOSIS — I10 Essential (primary) hypertension: Secondary | ICD-10-CM

## 2014-07-20 DIAGNOSIS — I2510 Atherosclerotic heart disease of native coronary artery without angina pectoris: Secondary | ICD-10-CM

## 2014-07-20 DIAGNOSIS — R42 Dizziness and giddiness: Secondary | ICD-10-CM

## 2014-07-20 DIAGNOSIS — E785 Hyperlipidemia, unspecified: Secondary | ICD-10-CM

## 2014-07-20 MED ORDER — CLOPIDOGREL BISULFATE 75 MG PO TABS: 75.0000 mg | ORAL_TABLET | Freq: Every day | ORAL | Status: DC

## 2014-07-20 NOTE — Assessment & Plan Note (Signed)
Currently with no symptoms of angina. No further workup at this time. Continue current medication regimen. 

## 2014-07-20 NOTE — Progress Notes (Signed)
Patient ID: [name]., male    DOB: 03-Aug-1933, 80 y.o.   MRN: [id number]  HPI Comments: [name] is a very pleasant 80 year-old gentleman with a history of coronary artery disease, stent placed to his proximal RCA in September 2007, taxis stent 3.0 x 24 mm, with stress test 08/2010 and May 2012,  patient of Dr. [name], previous syncopal episode (Etiology of his spell is uncertain), who presents for routine followup.  He had a nuclear Myoview earlier in 2014 that showed no ischemia. He has chronic dizziness when he changes position. This has been a chronic issue and he has learned to "live with it". No significant progression from previous visits.  In followup today, he has been taking one half of a lisinopril HCTZ 10/12.5 mg pill daily.  Blood pressure has been well controlled, systolic in the 130 range at home In general has been active, exercising on a regular basis. No complaints  Recent vertigo which did not respond to meclizine    stress test on Apr 20 2011 which showed no significant ischemia, normal ejection fraction estimated at 69%. There was a very small region of decreased perfusion suggestive of small region of old scar or attenuation artifact in the apical wall.  Overall this was a low risk scan. He was able to perform on the treadmill well with a maximum heart rate of 128 beats per minute, exercised for 8-1/2 minutes, peak blood pressure 194/81.  EKG shows normal sinus rhythm with rate 55 beats a minute, no significant ST or T wave changes Previous lab work showing total cholesterol 130, LDL 67    Outpatient Encounter Prescriptions as of 07/20/2014  Medication Sig  . aspirin 81 MG tablet Take 81 mg by mouth daily.    . clopidogrel (PLAVIX) 75 MG tablet Take 75 mg by mouth daily.    . Flaxseed, Linseed, OIL Take 3 g by mouth daily.   . folic acid (FOLVITE) 800 MCG tablet Take 800 mcg by mouth daily.   Marland Kitchen lisinopril-hydrochlorothiazide (PRINZIDE,ZESTORETIC)  10-12.5 MG per tablet Take 1 tablet by mouth daily.  . niacin 500 MG tablet Take 1,000 mg by mouth daily with breakfast.  . nitroGLYCERIN (NITROSTAT) 0.4 MG SL tablet Place 1 tablet (0.4 mg total) under the tongue every 5 (five) minutes as needed for chest pain.  . pregabalin (LYRICA) 100 MG capsule Take 100 mg by mouth 2 (two) times daily.    . simvastatin (ZOCOR) 40 MG tablet Take 20 mg by mouth every evening.   . Tamsulosin HCl (FLOMAX) 0.4 MG CAPS Take by mouth.  Marland Kitchen VITAMIN D, ERGOCALCIFEROL, PO Take 1,000 Units by mouth 2 (two) times daily.     Review of Systems  Constitutional: Negative.   HENT: Negative.   Eyes: Negative.   Respiratory: Negative.   Cardiovascular: Negative.   Gastrointestinal: Negative.   Musculoskeletal: Negative.   Skin: Negative.   Neurological: Positive for dizziness.  Psychiatric/Behavioral: Negative.   All other systems reviewed and are negative.   BP 132/80  Pulse 55  Ht 5' 10.5" (1.791 m)  Wt 185 lb 8 oz (84.142 kg)  BMI 26.23 kg/m2  Physical Exam  Nursing note and vitals reviewed. Constitutional: He is oriented to person, place, and time. He appears well-developed and well-nourished.  HENT:  Head: Normocephalic.  Nose: Nose normal.  Mouth/Throat: Oropharynx is clear and moist.  Eyes: Conjunctivae are normal. Pupils are equal, round, and reactive to light.  Neck: Normal range of motion.  Neck supple. No JVD present.  Cardiovascular: Normal rate, regular rhythm, S1 normal, S2 normal, normal heart sounds and intact distal pulses.  Exam reveals no gallop and no friction rub.   No murmur heard. Pulmonary/Chest: Effort normal and breath sounds normal. No respiratory distress. He has no wheezes. He has no rales. He exhibits no tenderness.  Abdominal: Soft. Bowel sounds are normal. He exhibits no distension. There is no tenderness.  Musculoskeletal: Normal range of motion. He exhibits no edema and no tenderness.  Lymphadenopathy:    He has no  cervical adenopathy.  Neurological: He is alert and oriented to person, place, and time. Coordination normal.  Skin: Skin is warm and dry. No rash noted. No erythema.  Psychiatric: He has a normal mood and affect. His behavior is normal. Judgment and thought content normal.      Assessment and Plan

## 2014-07-20 NOTE — Patient Instructions (Signed)
You are doing well. No medication changes were made.  Consider taking meclizine for vertigo (other the counter)  Please call us if you have new issues that need to be addressed before your next appt.  Your physician wants you to follow-up in: 12 months.  You will receive a reminder letter in the mail two months in advance. If you don't receive a letter, please call our office to schedule the follow-up appointment.

## 2014-07-20 NOTE — Assessment & Plan Note (Signed)
Cholesterol is at goal on the current lipid regimen. No changes to the medications were made.  

## 2014-07-20 NOTE — Assessment & Plan Note (Signed)
Blood pressure is well controlled on today's visit. No changes made to the medications. 

## 2014-07-20 NOTE — Assessment & Plan Note (Signed)
Recommended that he continue to take meclizine or Dramamine as needed

## 2014-08-27 ENCOUNTER — Ambulatory Visit: Payer: Self-pay | Admitting: Internal Medicine

## 2015-01-12 DIAGNOSIS — Z961 Presence of intraocular lens: Secondary | ICD-10-CM | POA: Insufficient documentation

## 2015-03-07 ENCOUNTER — Other Ambulatory Visit: Payer: Self-pay

## 2015-03-07 MED ORDER — LISINOPRIL-HYDROCHLOROTHIAZIDE 10-12.5 MG PO TABS: 1.0000 | ORAL_TABLET | Freq: Every day | ORAL | Status: DC

## 2015-03-07 NOTE — Telephone Encounter (Signed)
Refill sent for lisinopril hctz 10/12.5 mg

## 2015-04-18 ENCOUNTER — Telehealth: Payer: Self-pay | Admitting: Cardiovascular Disease

## 2015-04-18 NOTE — Telephone Encounter (Signed)
Patient wants to be seen for medication clearance.    [facility] Pro[facility]anolol but patient needs to see and get approval from Cardiology first as this may lower his bp.  Patients bp range has been 130's-140's /60's -70's per wife.  Gollan only has "same day add ons"  .   What should I do?

## 2015-04-19 NOTE — Telephone Encounter (Signed)
Pt wife calling on this to see if we can just call them and let them know what Dr Mariah MillingGollan thinks. Stated to her it is with doctor and she was fine, she just wanted to make sure he knew.  Please advise.

## 2015-04-20 NOTE — Telephone Encounter (Signed)
I can figure this out over the phone but need to know type of propranolol and dosing He is it extended-release once per day or 3 times per day What is the dose  Also what is his heart rate on average? Propranolol we'll decrease heart rate Why does his doctor want to start this?

## 2015-04-20 NOTE — Telephone Encounter (Signed)
S/w pt who indicated he can not read the prescription to know the propanolol dose.  Heart rate avg is 60-61bpm Per pt, Dr. Azucena CecilBurton starting him on med for tremors.  Pt stated he would bring prescription over to us to determine dosage.

## 2015-04-21 NOTE — Telephone Encounter (Signed)
Note from Dr. Lorin PicketScott placed on Dr. Windell HummingbirdGollan's desk for review.

## 2015-04-22 NOTE — Telephone Encounter (Signed)
Left message w/ Dr. Windell HummingbirdGollan's recommendation. Asked pt or his wife to call back w/ any questions or concerns.

## 2015-04-22 NOTE — Telephone Encounter (Signed)
I suspect he will be okay to do the regiment as detailed Low-dose propranolol, initially cut in half with slow titration upwards  If heart rate gets excessively low or he has profound fatigue, may have to keep dose low Even full pill in the morning and at noon is not a very high-dose

## 2015-04-26 ENCOUNTER — Other Ambulatory Visit: Payer: Self-pay

## 2015-04-26 MED ORDER — PROPRANOLOL HCL 10 MG PO TABS: 10.0000 mg | ORAL_TABLET | Freq: Two times a day (BID) | ORAL | Status: DC

## 2015-05-30 ENCOUNTER — Other Ambulatory Visit: Payer: Self-pay

## 2015-05-30 NOTE — Telephone Encounter (Signed)
This encounter was created in error - please disregard.

## 2015-06-03 ENCOUNTER — Ambulatory Visit (INDEPENDENT_AMBULATORY_CARE_PROVIDER_SITE_OTHER): Payer: Medicare Other | Admitting: Cardiovascular Disease

## 2015-06-03 ENCOUNTER — Encounter: Payer: Self-pay | Admitting: Cardiovascular Disease

## 2015-06-03 VITALS — BP 120/60 | HR 57 | Ht 70.5 in | Wt 184.8 lb

## 2015-06-03 DIAGNOSIS — I10 Essential (primary) hypertension: Secondary | ICD-10-CM

## 2015-06-03 DIAGNOSIS — E785 Hyperlipidemia, unspecified: Secondary | ICD-10-CM

## 2015-06-03 DIAGNOSIS — G250 Essential tremor: Secondary | ICD-10-CM

## 2015-06-03 DIAGNOSIS — I2510 Atherosclerotic heart disease of native coronary artery without angina pectoris: Secondary | ICD-10-CM | POA: Diagnosis not present

## 2015-06-03 DIAGNOSIS — R001 Bradycardia, unspecified: Secondary | ICD-10-CM | POA: Diagnosis not present

## 2015-06-03 NOTE — Assessment & Plan Note (Signed)
Cholesterol is at goal on the current lipid regimen. No changes to the medications were made.  

## 2015-06-03 NOTE — Assessment & Plan Note (Signed)
Currently with no symptoms of angina. No further workup at this time. Continue current medication regimen. 

## 2015-06-03 NOTE — Assessment & Plan Note (Signed)
Currently on low-dose propranolol with only mild improvement of his symptoms Unclear if he is a candidate for primidone Could try alternate beta blocker. Will research bystolic as this may have low side effect profile

## 2015-06-03 NOTE — Progress Notes (Signed)
Patient ID: Calvin AmberGeorge Stanford Gropp Jr., male    DOB: 1933/10/25, 81 y.o.   MRN: [id number]  HPI Comments: [name] is a very pleasant 81 year-old gentleman with a history of coronary artery disease, stent placed to his proximal RCA in September 2007, taxis stent 3.0 x 24 mm, with stress test 08/2010 and May 2012,  patient of [name], previous syncopal episode (Etiology of his spell is uncertain), who presents for routine followup of his coronary artery disease.  He has been taking propranolol 10 mg twice a day for essential tremor started by neurology at [facility] He reports if he does goes to 20 mg, he has bradycardia and excessive fatigue Otherwise he feels well with no chest pain or shortness of breath with exertion He has been taking propranolol 10 mg at 6:00 in the morning and noon  EKG on today's visit shows sinus bradycardia with rate 57 bpm, no significant ST or T-wave changes  Other past medical history He had a nuclear Myoview earlier in 2014 that showed no ischemia. He has chronic dizziness when he changes position. This has been a chronic issue and he has learned to "live with it". No significant progression from previous visits.    stress test on Apr 20 2011 which showed no significant ischemia, normal ejection fraction estimated at 69%. There was a very small region of decreased perfusion suggestive of small region of old scar or attenuation artifact in the apical wall.  Overall this was a low risk scan. He was able to perform on the treadmill well with a maximum heart rate of 128 beats per minute, exercised for 8-1/2 minutes, peak blood pressure 194/81.  EKG shows normal sinus rhythm with rate 55 beats a minute, no significant ST or T wave changes Previous lab work showing total cholesterol 130, LDL 67     Allergies  Allergen Reactions  . Penicillins     Rash     Current Outpatient Prescriptions on File Prior to Visit  Medication Sig Dispense Refill  . aspirin 81 MG  tablet Take 81 mg by mouth daily.      . clopidogrel (PLAVIX) 75 MG tablet Take 1 tablet (75 mg total) by mouth daily. 90 tablet 3  . Flaxseed, Linseed, OIL Take 3 g by mouth daily.     . folic acid (FOLVITE) 800 MCG tablet Take 800 mcg by mouth daily.     Marland Kitchen. lisinopril-hydrochlorothiazide (PRINZIDE,ZESTORETIC) 10-12.5 MG per tablet Take 1 tablet by mouth daily. 90 tablet 3  . niacin 500 MG tablet Take 1,000 mg by mouth daily with breakfast.    . nitroGLYCERIN (NITROSTAT) 0.4 MG SL tablet Place 1 tablet (0.4 mg total) under the tongue every 5 (five) minutes as needed for chest pain. 25 tablet 3  . pregabalin (LYRICA) 100 MG capsule Take 100 mg by mouth 2 (two) times daily.      . simvastatin (ZOCOR) 40 MG tablet Take 20 mg by mouth every evening.     . Tamsulosin HCl (FLOMAX) 0.4 MG CAPS Take by mouth.    Marland Kitchen. VITAMIN D, ERGOCALCIFEROL, PO Take 1,000 Units by mouth 2 (two) times daily.      No current facility-administered medications on file prior to visit.    Past Medical History  Diagnosis Date  . Hypertension   . Coronary artery disease   . BPH (benign prostatic hyperplasia)     Past Surgical History  Procedure Laterality Date  . Intestinal blockage    .  Skin cancer excision      x2  . Tonsillectomy      Social History  reports that he has never smoked. He has never used smokeless tobacco. He reports that he drinks about 3.6 - 4.8 oz of alcohol per week. He reports that he does not use illicit drugs.  Family History family history includes Cancer in his other; Hyperlipidemia in his other; Hypertension in his mother and other; Other in his other.   Review of Systems  Constitutional: Positive for fatigue.  Respiratory: Negative.   Cardiovascular: Negative.   Gastrointestinal: Negative.   Musculoskeletal: Negative.   Neurological: Positive for tremors.  Hematological: Negative.   Psychiatric/Behavioral: Negative.   All other systems reviewed and are negative.   BP 120/60  mmHg  Pulse 57  Ht 5' 10.5" (1.791 m)  Wt 184 lb 12 oz (83.802 kg)  BMI 26.13 kg/m2   Physical Exam  Constitutional: He is oriented to person, place, and time. He appears well-developed and well-nourished.  HENT:  Head: Normocephalic.  Nose: Nose normal.  Mouth/Throat: Oropharynx is clear and moist.  Eyes: Conjunctivae are normal. Pupils are equal, round, and reactive to light.  Neck: Normal range of motion. Neck supple. No JVD present.  Cardiovascular: Normal rate, regular rhythm, normal heart sounds and intact distal pulses.  Exam reveals no gallop and no friction rub.   No murmur heard. Pulmonary/Chest: Effort normal and breath sounds normal. No respiratory distress. He has no wheezes. He has no rales. He exhibits no tenderness.  Abdominal: Soft. Bowel sounds are normal. He exhibits no distension. There is no tenderness.  Musculoskeletal: Normal range of motion. He exhibits no edema or tenderness.  Lymphadenopathy:    He has no cervical adenopathy.  Neurological: He is alert and oriented to person, place, and time. Coordination normal.  Essential tremor noted bilaterally hands, mild  Skin: Skin is warm and dry. No rash noted. No erythema.  Psychiatric: He has a normal mood and affect. His behavior is normal. Judgment and thought content normal.

## 2015-06-03 NOTE — Assessment & Plan Note (Signed)
He is now on propranolol for tremor 10 mg twice a day. He will slowly titrate this upwards as tolerated. Previous bradycardia on 20 mg twice a day.  He will hold the lisinopril HCTZ if he does titrate up the propranolol again. We will research if bystolic 5 mg could be a alternate option. This might have less side effect

## 2015-06-03 NOTE — Patient Instructions (Signed)
You are doing well.  Ok to titrate up on the propranolol Hold the lisinopril/HCTZ when titrating up on the propranolol  I will research bystolic for tremor  Please call us if you have new issues that need to be addressed before your next appt.  Your physician wants you to follow-up in: 6 months.  You will receive a reminder letter in the mail two months in advance. If you don't receive a letter, please call our office to schedule the follow-up appointment.

## 2015-07-26 ENCOUNTER — Ambulatory Visit: Payer: Medicare Other | Admitting: Cardiovascular Disease

## 2015-08-15 ENCOUNTER — Other Ambulatory Visit: Payer: Self-pay

## 2015-08-15 MED ORDER — CLOPIDOGREL BISULFATE 75 MG PO TABS: 75.0000 mg | ORAL_TABLET | Freq: Every day | ORAL | Status: DC

## 2015-08-15 NOTE — Telephone Encounter (Signed)
Refill sent for plavix 75 mg 

## 2015-11-01 DIAGNOSIS — M5416 Radiculopathy, lumbar region: Secondary | ICD-10-CM | POA: Insufficient documentation

## 2015-11-01 DIAGNOSIS — M48062 Spinal stenosis, lumbar region with neurogenic claudication: Secondary | ICD-10-CM | POA: Insufficient documentation

## 2015-11-01 DIAGNOSIS — M4726 Other spondylosis with radiculopathy, lumbar region: Secondary | ICD-10-CM | POA: Insufficient documentation

## 2015-11-10 ENCOUNTER — Ambulatory Visit (INDEPENDENT_AMBULATORY_CARE_PROVIDER_SITE_OTHER): Payer: Medicare Other | Admitting: Cardiovascular Disease

## 2015-11-10 ENCOUNTER — Encounter: Payer: Self-pay | Admitting: Cardiovascular Disease

## 2015-11-10 VITALS — BP 140/76 | HR 52 | Ht 70.5 in | Wt 189.5 lb

## 2015-11-10 DIAGNOSIS — R001 Bradycardia, unspecified: Secondary | ICD-10-CM | POA: Diagnosis not present

## 2015-11-10 DIAGNOSIS — I10 Essential (primary) hypertension: Secondary | ICD-10-CM | POA: Diagnosis not present

## 2015-11-10 DIAGNOSIS — E785 Hyperlipidemia, unspecified: Secondary | ICD-10-CM

## 2015-11-10 DIAGNOSIS — I2510 Atherosclerotic heart disease of native coronary artery without angina pectoris: Secondary | ICD-10-CM | POA: Diagnosis not present

## 2015-11-10 HISTORY — DX: Bradycardia, unspecified: R00.1

## 2015-11-10 NOTE — Assessment & Plan Note (Signed)
Asymptomatic bradycardia, likely exacerbated by propranolol being used for tremors. Suggested he monitor his heart rate especially with any symptoms of fatigue, lightheadedness, exercise intolerance.

## 2015-11-10 NOTE — Progress Notes (Signed)
Patient ID: Calvin Amber., male    DOB: [birth year], 81 y.o.   MRN: [id number]  HPI Comments: [name] is a very pleasant 81 year-old gentleman with a history of coronary artery disease, stent placed to his proximal RCA in September 2007, taxis stent 3.0 x 24 mm, with stress test 08/2010 and May 2012,  patient of Dr. [name], previous syncopal episode (Etiology of his spell is uncertain), who presents for routine followup of his coronary artery disease.  In follow-up today, he reports that he is doing very well  no chest pain or shortness of breath with exertion He has been taking propranolol 10 mg at 6:00 in the morning and noon for tremor Asymptomatic bradycardia Continues to his exercise daily with good exercise tolerance  Lab work reviewed with him showing total cholesterol 129, LDL 72 in October 2016  EKG on today's visit shows sinus bradycardia with rate 52 bpm, no significant ST or T-wave changes, left axis deviation  Other past medical history He had a nuclear Myoview earlier in 2014 that showed no ischemia. He has chronic dizziness when he changes position. This has been a chronic issue and he has learned to "live with it". No significant progression from previous visits.    stress test on Apr 20 2011 which showed no significant ischemia, normal ejection fraction estimated at 69%. There was a very small region of decreased perfusion suggestive of small region of old scar or attenuation artifact in the apical wall.  Overall this was a low risk scan. He was able to perform on the treadmill well with a maximum heart rate of 128 beats per minute, exercised for 8-1/2 minutes, peak blood pressure 194/81.     Allergies  Allergen Reactions  . Codeine Nausea Only    Other reaction(s): Vomiting  . Penicillins     Rash     Current Outpatient Prescriptions on File Prior to Visit  Medication Sig Dispense Refill  . aspirin 81 MG tablet Take 81 mg by mouth daily.      .  clopidogrel (PLAVIX) 75 MG tablet Take 1 tablet (75 mg total) by mouth daily. 90 tablet 3  . Flaxseed, Linseed, OIL Take 3 g by mouth daily.     . folic acid (FOLVITE) 800 MCG tablet Take 800 mcg by mouth daily.     Marland Kitchen lisinopril-hydrochlorothiazide (PRINZIDE,ZESTORETIC) 10-12.5 MG per tablet Take 1 tablet by mouth daily. 90 tablet 3  . niacin 500 MG tablet Take 1,000 mg by mouth daily with breakfast.    . nitroGLYCERIN (NITROSTAT) 0.4 MG SL tablet Place 1 tablet (0.4 mg total) under the tongue every 5 (five) minutes as needed for chest pain. 25 tablet 3  . pregabalin (LYRICA) 100 MG capsule Take 100 mg by mouth 2 (two) times daily.      . propranolol (INDERAL) 10 MG tablet Take 10 mg by mouth 2 (two) times daily.    . simvastatin (ZOCOR) 40 MG tablet Take 20 mg by mouth every evening.     . Tamsulosin HCl (FLOMAX) 0.4 MG CAPS Take 0.8 mg by mouth daily.     Marland Kitchen VITAMIN D, ERGOCALCIFEROL, PO Take 1,000 Units by mouth 2 (two) times daily.      No current facility-administered medications on file prior to visit.    Past Medical History  Diagnosis Date  . Hypertension   . Coronary artery disease   . BPH (benign prostatic hyperplasia)     Past Surgical History  Procedure  Laterality Date  . Intestinal blockage    . Skin cancer excision      x2  . Tonsillectomy      Social History  reports that he has never smoked. He has never used smokeless tobacco. He reports that he drinks about 3.6 - 4.8 oz of alcohol per week. He reports that he does not use illicit drugs.  Family History family history includes Cancer in his other; Hyperlipidemia in his other; Hypertension in his mother and other; Other in his other.   Review of Systems  Constitutional: Negative.   Respiratory: Negative.   Cardiovascular: Negative.   Gastrointestinal: Negative.   Musculoskeletal: Negative.   Neurological: Positive for tremors.  Hematological: Negative.   Psychiatric/Behavioral: Negative.   All other  systems reviewed and are negative.   BP 140/76 mmHg  Pulse 52  Ht 5' 10.5" (1.791 m)  Wt 189 lb 8 oz (85.957 kg)  BMI 26.80 kg/m2   Physical Exam  Constitutional: He is oriented to person, place, and time. He appears well-developed and well-nourished.  HENT:  Head: Normocephalic.  Nose: Nose normal.  Mouth/Throat: Oropharynx is clear and moist.  Eyes: Conjunctivae are normal. Pupils are equal, round, and reactive to light.  Neck: Normal range of motion. Neck supple. No JVD present.  Cardiovascular: Normal rate, regular rhythm, normal heart sounds and intact distal pulses.  Exam reveals no gallop and no friction rub.   No murmur heard. Pulmonary/Chest: Effort normal and breath sounds normal. No respiratory distress. He has no wheezes. He has no rales. He exhibits no tenderness.  Abdominal: Soft. Bowel sounds are normal. He exhibits no distension. There is no tenderness.  Musculoskeletal: Normal range of motion. He exhibits no edema or tenderness.  Lymphadenopathy:    He has no cervical adenopathy.  Neurological: He is alert and oriented to person, place, and time. Coordination normal.  Essential tremor noted bilaterally hands, mild  Skin: Skin is warm and dry. No rash noted. No erythema.  Psychiatric: He has a normal mood and affect. His behavior is normal. Judgment and thought content normal.

## 2015-11-10 NOTE — Assessment & Plan Note (Signed)
Cholesterol is at goal on the current lipid regimen. No changes to the medications were made.  

## 2015-11-10 NOTE — Assessment & Plan Note (Signed)
Blood pressure is well controlled on today's visit. No changes made to the medications. 

## 2015-11-10 NOTE — Patient Instructions (Signed)
You are doing well. No medication changes were made.  Please call us if you have new issues that need to be addressed before your next appt.  Your physician wants you to follow-up in: 6 months.  You will receive a reminder letter in the mail two months in advance. If you don't receive a letter, please call our office to schedule the follow-up appointment.   

## 2015-11-10 NOTE — Assessment & Plan Note (Signed)
Currently with no symptoms of angina. No further workup at this time. Continue current medication regimen. 

## 2015-12-13 ENCOUNTER — Encounter: Payer: Self-pay | Admitting: Internal Medicine

## 2015-12-20 DIAGNOSIS — R2689 Other abnormalities of gait and mobility: Secondary | ICD-10-CM | POA: Insufficient documentation

## 2015-12-29 ENCOUNTER — Encounter: Payer: Self-pay | Admitting: Cardiovascular Disease

## 2015-12-29 ENCOUNTER — Ambulatory Visit (INDEPENDENT_AMBULATORY_CARE_PROVIDER_SITE_OTHER): Payer: Medicare Other | Admitting: Cardiovascular Disease

## 2015-12-29 VITALS — BP 140/72 | HR 62 | Ht 70.5 in | Wt 186.5 lb

## 2015-12-29 DIAGNOSIS — I10 Essential (primary) hypertension: Secondary | ICD-10-CM | POA: Diagnosis not present

## 2015-12-29 DIAGNOSIS — I2510 Atherosclerotic heart disease of native coronary artery without angina pectoris: Secondary | ICD-10-CM | POA: Diagnosis not present

## 2015-12-29 DIAGNOSIS — G250 Essential tremor: Secondary | ICD-10-CM

## 2015-12-29 DIAGNOSIS — R001 Bradycardia, unspecified: Secondary | ICD-10-CM

## 2015-12-29 NOTE — Progress Notes (Signed)
Patient ID: Calvin Amber., male    DOB: [birth year], 82 y.o.   MRN: [id number]  HPI Comments: [name] is a very pleasant 82 year-old gentleman with a history of coronary artery disease, stent placed to his proximal RCA in September 2007, taxis stent 3.0 x 24 mm, with stress test 08/2010 and May 2012,  patient of Dr. [name], previous syncopal episode (Etiology of his spell is uncertain), who presents for routine followup of his coronary artery disease and his tremor  On today's visit, he reports that he stopped his propranolol which he was taking for tremor He was taking 10 mg twice a day and reports having bradycardia, perhaps 10-15% improvement in his tremor Previously seen by neurology at [facility], tried on one medication that he states did not work Been tried on propranolol. He was actually instructed to increase propranolol up to 20 mg twice a day. He did not do this secondary to bradycardia   He is interested in other options, possibly other beta blockers for his tremor Otherwise he reports doing well, exercises, no symptoms of chest pain or angina Taking flaxseed  Lab work reviewed with him showing total cholesterol 129, LDL 72 in October 2016  EKG on today's visit shows sinus bradycardia with rate 62 bpm, no significant ST or T-wave changes, left axis deviation  Other past medical history He had a nuclear Myoview earlier in 2014 that showed no ischemia. He has chronic dizziness when he changes position. This has been a chronic issue and he has learned to "live with it". No significant progression from previous visits.    stress test on Apr 20 2011 which showed no significant ischemia, normal ejection fraction estimated at 69%. There was a very small region of decreased perfusion suggestive of small region of old scar or attenuation artifact in the apical wall.  Overall this was a low risk scan. He was able to perform on the treadmill well with a maximum heart rate of 128 beats  per minute, exercised for 8-1/2 minutes, peak blood pressure 194/81.     Allergies  Allergen Reactions  . Codeine Nausea Only    Other reaction(s): Vomiting  . Penicillins     Rash     Current Outpatient Prescriptions on File Prior to Visit  Medication Sig Dispense Refill  . aspirin 81 MG tablet Take 81 mg by mouth daily.      . clopidogrel (PLAVIX) 75 MG tablet Take 1 tablet (75 mg total) by mouth daily. 90 tablet 3  . Flaxseed, Linseed, OIL Take 3 g by mouth daily.     . folic acid (FOLVITE) 800 MCG tablet Take 800 mcg by mouth daily.     Marland Kitchen lisinopril-hydrochlorothiazide (PRINZIDE,ZESTORETIC) 10-12.5 MG per tablet Take 1 tablet by mouth daily. (Patient taking differently: Take 0.5 tablets by mouth daily. ) 90 tablet 3  . niacin 500 MG tablet Take 1,000 mg by mouth daily with breakfast.    . nitroGLYCERIN (NITROSTAT) 0.4 MG SL tablet Place 1 tablet (0.4 mg total) under the tongue every 5 (five) minutes as needed for chest pain. 25 tablet 3  . pregabalin (LYRICA) 100 MG capsule Take 100 mg by mouth 2 (two) times daily.      . simvastatin (ZOCOR) 40 MG tablet Take 20 mg by mouth every evening.     . Tamsulosin HCl (FLOMAX) 0.4 MG CAPS Take 0.8 mg by mouth daily.     Marland Kitchen VITAMIN D, ERGOCALCIFEROL, PO Take 1,000 Units by  mouth 2 (two) times daily.      No current facility-administered medications on file prior to visit.    Past Medical History  Diagnosis Date  . Hypertension   . Coronary artery disease   . BPH (benign prostatic hyperplasia)     Past Surgical History  Procedure Laterality Date  . Intestinal blockage    . Skin cancer excision      x2  . Tonsillectomy      Social History  reports that he has never smoked. He has never used smokeless tobacco. He reports that he drinks about 3.6 - 4.8 oz of alcohol per week. He reports that he does not use illicit drugs.  Family History family history includes Cancer in his other; Hyperlipidemia in his other; Hypertension in  his mother and other; Other in his other.   Review of Systems  Constitutional: Negative.   Respiratory: Negative.   Cardiovascular: Negative.   Gastrointestinal: Negative.   Musculoskeletal: Negative.   Neurological: Positive for tremors.  Hematological: Negative.   Psychiatric/Behavioral: Negative.   All other systems reviewed and are negative.   BP 140/72 mmHg  Pulse 62  Ht 5' 10.5" (1.791 m)  Wt 186 lb 8 oz (84.596 kg)  BMI 26.37 kg/m2  Physical Exam  Constitutional: He is oriented to person, place, and time. He appears well-developed and well-nourished.  HENT:  Head: Normocephalic.  Nose: Nose normal.  Mouth/Throat: Oropharynx is clear and moist.  Eyes: Conjunctivae are normal. Pupils are equal, round, and reactive to light.  Neck: Normal range of motion. Neck supple. No JVD present.  Cardiovascular: Normal rate, regular rhythm, normal heart sounds and intact distal pulses.  Exam reveals no gallop and no friction rub.   No murmur heard. Pulmonary/Chest: Effort normal and breath sounds normal. No respiratory distress. He has no wheezes. He has no rales. He exhibits no tenderness.  Abdominal: Soft. Bowel sounds are normal. He exhibits no distension. There is no tenderness.  Musculoskeletal: Normal range of motion. He exhibits no edema or tenderness.  Lymphadenopathy:    He has no cervical adenopathy.  Neurological: He is alert and oriented to person, place, and time. Coordination normal.  Essential tremor noted bilaterally hands, mild  Skin: Skin is warm and dry. No rash noted. No erythema.  Psychiatric: He has a normal mood and affect. His behavior is normal. Judgment and thought content normal.

## 2015-12-29 NOTE — Patient Instructions (Signed)
You are doing well.  We will start bystolic 5 mg one a day Ok to increase up to two a day after one week for tremor   Please call us if you have new issues that need to be addressed before your next appt.  Your physician wants you to follow-up in: 6 months.  You will receive a reminder letter in the mail two months in advance. If you don't receive a letter, please call our office to schedule the follow-up appointment.

## 2015-12-29 NOTE — Assessment & Plan Note (Signed)
Currently with no symptoms of angina. No further workup at this time. Continue current medication regimen. 

## 2015-12-29 NOTE — Assessment & Plan Note (Signed)
He has stopped his propranolol  As he did not feel this was helping his tremor and had bradycardia.  He is interested in alternate their base, possibly beta blockers.  Suggested he could try a long-acting beta blocker such as bystolic  For a few Weeks to see if there is any improvement in his symptoms.  Suggested he try 5 mg for one week then up to 10 mg if tolerated. Samples of 10 mg were provided

## 2015-12-29 NOTE — Assessment & Plan Note (Signed)
Beta blocker added to his regiment, will try low-dose bystolic,  Also in the hope that he might have some benefit to his tremor.  He was unable to advance his propranolol

## 2016-03-13 ENCOUNTER — Telehealth: Payer: Self-pay | Admitting: Cardiovascular Disease

## 2016-03-13 NOTE — Telephone Encounter (Signed)
Left message for pt to call back  °

## 2016-03-13 NOTE — Telephone Encounter (Signed)
Spoke w/ pt.  He reports that he has not very little energy for the past 2-3 days. States that he cannot do what he used to do, realizes that he is getting older, but frustrated that he cannot do what he could do at [id number]. His BP has been running 130-140/60-70s, HR high 50-60s. He had CPE last week and "everything was fine". He reports back pain and some SOBOE when walking across the yard. Offered pt appt to be evaluated, but he states that he has an appt in June, unsure if that is too long to wait.  Pt sched to see Dr. Mariah MillingGollan 03/29/16, advised to proceed to ED or call 911 if his sx become emergent.

## 2016-03-13 NOTE — Telephone Encounter (Signed)
Pt wife calling stating pt is not feeling well.  Is just worried that he may have another blockage  Just did not want to let us sit and wait Just very tired, no energy.  This is not like him, she states Going on for a couple weeks  Denies SOB/CP or dizzy spells Please advise

## 2016-03-20 ENCOUNTER — Telehealth: Payer: Self-pay | Admitting: Cardiovascular Disease

## 2016-03-20 NOTE — Telephone Encounter (Signed)
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 Mariah MillingGollan to write a letter stating that this could have or did cause him to need a stent placement.  Please call to discuss.  Patient also stated he needed to cancel upcoming appt. As he is feeling better and doesn't need rx meds that he was given.

## 2016-03-20 NOTE — Telephone Encounter (Signed)
Spoke w/ pt.  He reports that he has stopped his propranolol a week ago and feels "like the old [name]".   His HR has increased slightly, but not much, he is monitoring, as well as his BP and "I can take a 1/2 Bystolic if that starts going up".  [place], as he was possibly exposed to Agent Orange in [place].  He is requesting a letter from [name] stating that this possibly contributed to his heart problems, neuropathy, tremors and stent placement in 2007. He has a letter template that he will drop off of reference if [name] is agreeable to writing this.

## 2016-03-20 NOTE — Telephone Encounter (Signed)
Left message for pt to call back  °

## 2016-03-21 NOTE — Telephone Encounter (Signed)
Happy to look at the letter template

## 2016-03-21 NOTE — Telephone Encounter (Signed)
Called and spoke with patient and let him know to please bring in letter template for Dr. Mariah MillingGollan to review. He stated that he would drop this off tomorrow sometime. Let him know that I would notify Dr. Windell HummingbirdGollan's nurse as well. He had no further questions or needs at this time.

## 2016-03-22 NOTE — Telephone Encounter (Signed)
Sample letter, as well as pt's written request from pt is on Dr. Windell HummingbirdGollan's desk for review.

## 2016-03-29 ENCOUNTER — Ambulatory Visit: Payer: Medicare Other | Admitting: Cardiovascular Disease

## 2016-04-05 ENCOUNTER — Other Ambulatory Visit: Payer: Self-pay | Admitting: Cardiovascular Disease

## 2016-04-05 NOTE — Telephone Encounter (Signed)
Left message for pt that letter is typed. Asked him to call back & let me know if her would prefer for me to mail to him or if he would like pick up.

## 2016-04-05 NOTE — Telephone Encounter (Signed)
Spoke w/ pt.  He would prefer to stop by & p/u letter. Advised him that I am placing it up front to p/u at his convenience. He is appreciative of Dr. Windell HummingbirdGollan's help w/ this.

## 2016-06-26 ENCOUNTER — Ambulatory Visit (INDEPENDENT_AMBULATORY_CARE_PROVIDER_SITE_OTHER): Payer: Medicare Other | Admitting: Cardiovascular Disease

## 2016-06-26 ENCOUNTER — Encounter: Payer: Self-pay | Admitting: Cardiovascular Disease

## 2016-06-26 VITALS — BP 130/70 | HR 62 | Ht 70.5 in | Wt 185.5 lb

## 2016-06-26 DIAGNOSIS — I2510 Atherosclerotic heart disease of native coronary artery without angina pectoris: Secondary | ICD-10-CM

## 2016-06-26 DIAGNOSIS — I10 Essential (primary) hypertension: Secondary | ICD-10-CM

## 2016-06-26 DIAGNOSIS — G250 Essential tremor: Secondary | ICD-10-CM

## 2016-06-26 DIAGNOSIS — E785 Hyperlipidemia, unspecified: Secondary | ICD-10-CM | POA: Diagnosis not present

## 2016-06-26 NOTE — Patient Instructions (Addendum)
Watch the bananas  No medications changes   Follow-Up: It was a pleasure seeing you in the office today. Please call us if you have new issues that need to be addressed before your next appt.  [phone]  Your physician wants you to follow-up in: 12 months.  You will receive a reminder letter in the mail two months in advance. If you don't receive a letter, please call our office to schedule the follow-up appointment.  If you need a refill on your cardiac medications before your next appointment, please call your pharmacy.

## 2016-06-26 NOTE — Progress Notes (Signed)
Cardiology Office Note  Date:  06/26/2016   ID:  [name]., DOB 12/22/1932, MRN [id number]  PCP:  Calvin Arbour, MD   Chief Complaint  Patient presents with  . Other    6 month follow up. Meds reviewed by the patient verbally. "doing well."     HPI:  [name] is a very pleasant 82 year-old gentleman with a history of coronary artery disease, stent placed to his proximal RCA in September 2007, taxis stent 3.0 x 24 mm, with stress test 08/2010 and May 2012,  patient of Dr. Judithann Pollard, previous syncopal episode (Etiology of his spell is uncertain), who presents for routine followup of his coronary artery disease and his tremor  On his last clinic visit he had stopped propranolol secondary to bradycardia, no improvement in his tremor We did try him on 8 on 2 and it beta blocker, bystolic, But he reports this did not help frame much either Currently feels better on no beta blockers Takes one half pill of his lisinopril HCTZ, reports blood pressures well controlled He continues to go to the gym, stays active, does yard work in the summer  no symptoms of chest pain or angina  Lab work reviewed with him showing total cholesterol 129, LDL 72 in October 2016  EKG on today's visit shows sinus bradycardia with rate 63 bpm, unable to exclude old inferior MI  Other past medical history He had a nuclear Myoview earlier in 2014 that showed no ischemia. He has chronic dizziness when he changes position. This has been a chronic issue and he has learned to "live with it". No significant progression from previous visits.    stress test on Apr 20 2011 which showed no significant ischemia, normal ejection fraction estimated at 69%. There was a very small region of decreased perfusion suggestive of small region of old scar or attenuation artifact in the apical wall.  Overall this was a low risk scan. He was able to perform on the treadmill well with a maximum heart rate of 128 beats per  minute, exercised for 8-1/2 minutes, peak blood pressure 194/81.  PMH:   has a past medical history of BPH (benign prostatic hyperplasia); Coronary artery disease; and Hypertension.  PSH:    Past Surgical History:  Procedure Laterality Date  . Intestinal Blockage    . SKIN CANCER EXCISION     x2  . TONSILLECTOMY      Current Outpatient Prescriptions  Medication Sig Dispense Refill  . aspirin 81 MG tablet Take 81 mg by mouth daily.      Marland Kitchen b complex vitamins tablet Take 1 tablet by mouth daily.    . clopidogrel (PLAVIX) 75 MG tablet Take 1 tablet (75 mg total) by mouth daily. 90 tablet 3  . Flaxseed, Linseed, OIL Take 3 g by mouth daily.     . folic acid (FOLVITE) 800 MCG tablet Take 800 mcg by mouth daily.     Marland Kitchen lisinopril-hydrochlorothiazide (PRINZIDE,ZESTORETIC) 10-12.5 MG per tablet Take 1 tablet by mouth daily. (Patient taking differently: Take 0.5 tablets by mouth daily. ) 90 tablet 3  . niacin 500 MG tablet Take 1,000 mg by mouth daily with breakfast.    . nitroGLYCERIN (NITROSTAT) 0.4 MG SL tablet Place 1 tablet (0.4 mg total) under the tongue every 5 (five) minutes as needed for chest pain. 25 tablet 3  . pregabalin (LYRICA) 100 MG capsule Take 100 mg by mouth 2 (two) times daily.      Marland Kitchen  simvastatin (ZOCOR) 40 MG tablet Take 20 mg by mouth every evening.     . Tamsulosin HCl (FLOMAX) 0.4 MG CAPS Take 0.8 mg by mouth daily.     Marland Kitchen VITAMIN D, ERGOCALCIFEROL, PO Take 1,000 Units by mouth 2 (two) times daily.      No current facility-administered medications for this visit.      Allergies:   Codeine and Penicillins   Social History:  The patient  reports that he has never smoked. He has never used smokeless tobacco. He reports that he drinks about 3.6 - 4.8 oz of alcohol per week . He reports that he does not use drugs.   Family History:   family history includes Cancer in his other; Hyperlipidemia in his other; Hypertension in his mother and other; Other in his other.     Review of Systems: Review of Systems  Constitutional: Negative.   Respiratory: Negative.   Cardiovascular: Negative.   Gastrointestinal: Negative.   Musculoskeletal: Negative.   Neurological: Positive for tremors.  Psychiatric/Behavioral: Negative.   All other systems reviewed and are negative.    PHYSICAL EXAM: VS:  BP 130/70 (BP Location: Left Arm, Patient Position: Sitting, Cuff Size: Normal)   Pulse 62   Ht 5' 10.5" (1.791 m)   Wt 185 lb 8 oz (84.1 kg)   BMI 26.24 kg/m  , BMI Body mass index is 26.24 kg/m. GEN: Well nourished, well developed, in no acute distress  HEENT: normal  Neck: no JVD, carotid bruits, or masses Cardiac: RRR; no murmurs, rubs, or gallops,no edema  Respiratory:  clear to auscultation bilaterally, normal work of breathing GI: soft, nontender, nondistended, + BS MS: no deformity or atrophy  Skin: warm and dry, no rash Neuro:  Strength and sensation are intact Psych: euthymic mood, full affect    Recent Labs: No results found for requested labs within last 8760 hours.    Lipid Panel Lab Results  Component Value Date   CHOL 122 01/23/2010   HDL 33.8 01/23/2010   LDLCALC 69.6 01/23/2010   TRIG 93 01/23/2010      Wt Readings from Last 3 Encounters:  06/26/16 185 lb 8 oz (84.1 kg)  12/29/15 186 lb 8 oz (84.6 kg)  11/10/15 189 lb 8 oz (86 kg)       ASSESSMENT AND PLAN:  Atherosclerosis of native coronary artery of native heart without angina pectoris - Plan: EKG 12-Lead Currently with no symptoms of angina. No further workup at this time. Continue current medication regimen. He does not want a beta blocker  HYPERTENSION, BENIGN - Plan: EKG 12-Lead Blood pressure is well controlled on today's visit. No changes made to the medications.  Hyperlipidemia Cholesterol is at goal on the current lipid regimen. No changes to the medications were made.  Essential tremor Tolerating the tremor, does not want medications   Total  encounter time more than 15 minutes  Greater than 50% was spent in counseling and coordination of care with the patient    Disposition:   F/U  12 months   Orders Placed This Encounter  Procedures  . EKG 12-Lead     Signed, [name] Pollard, M.D., Ph.D. 06/26/2016  [facility] [place], [place] [phone]

## 2016-07-18 ENCOUNTER — Other Ambulatory Visit: Payer: Self-pay | Admitting: Cardiovascular Disease

## 2016-12-06 ENCOUNTER — Other Ambulatory Visit: Payer: Self-pay | Admitting: Cardiovascular Disease

## 2016-12-13 ENCOUNTER — Telehealth: Payer: Self-pay | Admitting: Cardiovascular Disease

## 2016-12-13 NOTE — Telephone Encounter (Signed)
Per request patient record printed from 2016-present .  Placed in envelope at front desk.  Patient to sign roi upon pick up.

## 2016-12-24 NOTE — Telephone Encounter (Signed)
Patient signed [name] and picked up printed records.

## 2016-12-27 DIAGNOSIS — G20C Parkinsonism, unspecified: Secondary | ICD-10-CM | POA: Insufficient documentation

## 2017-01-05 ENCOUNTER — Emergency Department: Payer: Medicare Other

## 2017-01-05 ENCOUNTER — Emergency Department
Admission: EM | Admit: 2017-01-05 | Discharge: 2017-01-05 | Disposition: A | Discharge status: Other Acute Inpatient Hospital | Payer: Medicare Other | Attending: Emergency Medicine | Admitting: Emergency Medicine

## 2017-01-05 DIAGNOSIS — I619 Nontraumatic intracerebral hemorrhage, unspecified: Secondary | ICD-10-CM | POA: Diagnosis not present

## 2017-01-05 DIAGNOSIS — I2510 Atherosclerotic heart disease of native coronary artery without angina pectoris: Secondary | ICD-10-CM | POA: Diagnosis not present

## 2017-01-05 DIAGNOSIS — Z79899 Other long term (current) drug therapy: Secondary | ICD-10-CM | POA: Diagnosis not present

## 2017-01-05 DIAGNOSIS — Z7982 Long term (current) use of aspirin: Secondary | ICD-10-CM | POA: Diagnosis not present

## 2017-01-05 DIAGNOSIS — R4182 Altered mental status, unspecified: Secondary | ICD-10-CM | POA: Diagnosis present

## 2017-01-05 DIAGNOSIS — I10 Essential (primary) hypertension: Secondary | ICD-10-CM | POA: Diagnosis not present

## 2017-01-05 LAB — COMPREHENSIVE METABOLIC PANEL
ALT: 26 U/L (ref 17–63)
AST: 31 U/L (ref 15–41)
Albumin: 4.7 g/dL (ref 3.5–5.0)
Alkaline Phosphatase: 81 U/L (ref 38–126)
Anion gap: 6 (ref 5–15)
BUN: 20 mg/dL (ref 6–20)
CO2: 29 mmol/L (ref 22–32)
Calcium: 9.3 mg/dL (ref 8.9–10.3)
Chloride: 102 mmol/L (ref 101–111)
Creatinine, Ser: 1.11 mg/dL (ref 0.61–1.24)
GFR calc Af Amer: >60 mL/min (ref >60)
GFR calc non Af Amer: >60 mL/min (ref >60)
Glucose, Bld: 71 mg/dL (ref 65–99)
Potassium: 4.1 mmol/L (ref 3.5–5.1)
Sodium: 137 mmol/L (ref 135–145)
Total Bilirubin: 1.6 mg/dL — ABNORMAL HIGH (ref 0.3–1.2)
Total Protein: 7.4 g/dL (ref 6.5–8.1)

## 2017-01-05 LAB — CBC
HCT: 45.3 % (ref 40.0–52.0)
Hemoglobin: 15.5 g/dL (ref 13.0–18.0)
MCH: 32.6 pg (ref 26.0–34.0)
MCHC: 34.2 g/dL (ref 32.0–36.0)
MCV: 95.2 fL (ref 80.0–100.0)
Platelets: 160 10*3/uL (ref 150–440)
RBC: 4.75 MIL/uL (ref 4.40–5.90)
RDW: 13.3 % (ref 11.5–14.5)
WBC: 10.5 10*3/uL (ref 3.8–10.6)

## 2017-01-05 LAB — DIFFERENTIAL
Basophils Absolute: 0.1 10*3/uL (ref 0–0.1)
Basophils Relative: 1 %
Eosinophils Absolute: 0.3 10*3/uL (ref 0–0.7)
Eosinophils Relative: 3 %
Lymphocytes Relative: 31 %
Lymphs Abs: 3.2 10*3/uL (ref 1.0–3.6)
Monocytes Absolute: 1 10*3/uL (ref 0.2–1.0)
Monocytes Relative: 10 %
Neutro Abs: 5.9 10*3/uL (ref 1.4–6.5)
Neutrophils Relative %: 55 %

## 2017-01-05 LAB — TROPONIN I: Troponin I: <0.03 ng/mL (ref <0.03)

## 2017-01-05 LAB — PROTIME-INR
INR: 1.01
Prothrombin Time: 13.3 seconds (ref 11.4–15.2)

## 2017-01-05 LAB — APTT: aPTT: 30 seconds (ref 24–36)

## 2017-01-05 LAB — GLUCOSE, CAPILLARY: Glucose-Capillary: 66 mg/dL (ref 65–99)

## 2017-01-05 MED ORDER — LABETALOL HCL 5 MG/ML IV SOLN
5.0000 mg | Freq: Once | INTRAVENOUS | Status: DC
Start: 2017-01-05 — End: 2017-01-05

## 2017-01-05 MED ORDER — NICARDIPINE HCL IN NACL 20-0.86 MG/200ML-% IV SOLN
3.0000 mg/h | Freq: Once | INTRAVENOUS | Status: AC
  Administered 2017-01-05: 3 mg/h via INTRAVENOUS
  Filled 2017-01-05: qty 200

## 2017-01-05 NOTE — ED Provider Notes (Signed)
[facility] Emergency Department Provider Note   ____________________________________________   First MD Initiated Contact with Patient 01/05/17 1131     (approximate)  I have reviewed the triage vital signs and the nursing notes.   HISTORY  Chief Complaint Altered Mental Status    HPI Calvin AmberGeorge Stanford Riedl Jr. is a 83 y.o. male who went out to dinner with his wife yesterday went to bed and was fine when his wife woke up at 7:30 this morning her husband told her that he was confused and couldn't remember how to "cut the light on in the kitchen". He has no weakness or numbness. He has a neurologist at [facility] which has been seeing him for a gradually worsening intention tremor specially in the right arm. That's Dr. Jerold CoombeBurton Scott.   Past Medical History:  Diagnosis Date  . BPH (benign prostatic hyperplasia)   . Coronary artery disease   . Hypertension     Patient Active Problem List   Diagnosis Date Noted  . Bradycardia 11/10/2015  . Essential tremor 06/03/2015  . Dizziness 08/21/2012  . Syncope 04/10/2011  . CALF PAIN, LEFT 10/11/2010  . DYSPEPSIA 07/31/2010  . CHEST DISCOMFORT 07/31/2010  . Hyperlipidemia 05/16/2010  . HYPERTENSION, BENIGN 05/16/2010  . CAD, NATIVE VESSEL 05/16/2010    Past Surgical History:  Procedure Laterality Date  . Intestinal Blockage    . SKIN CANCER EXCISION     x2  . TONSILLECTOMY      Prior to Admission medications   Medication Sig Start Date End Date Taking? Authorizing Provider  aspirin 81 MG tablet Take 81 mg by mouth daily.      Historical Provider, MD  b complex vitamins tablet Take 1 tablet by mouth daily.    Historical Provider, MD  clopidogrel (PLAVIX) 75 MG tablet Take 1 tablet (75 mg total) by mouth daily. 12/06/16   Antonieta Ibaimothy J Gollan, MD  Flaxseed, Linseed, OIL Take 3 g by mouth daily.     Historical Provider, MD  folic acid (FOLVITE) 800 MCG tablet Take 800 mcg by mouth daily.     Historical Provider,  MD  lisinopril-hydrochlorothiazide (PRINZIDE,ZESTORETIC) 10-12.5 MG tablet Take 1 tablet by mouth daily. 07/18/16   Antonieta Ibaimothy J Gollan, MD  niacin 500 MG tablet Take 1,000 mg by mouth daily with breakfast.    Historical Provider, MD  nitroGLYCERIN (NITROSTAT) 0.4 MG SL tablet Place 1 tablet (0.4 mg total) under the tongue every 5 (five) minutes as needed for chest pain. 02/25/13   Roger A Arguello, PA-C  pregabalin (LYRICA) 100 MG capsule Take 100 mg by mouth 2 (two) times daily.      Historical Provider, MD  simvastatin (ZOCOR) 40 MG tablet Take 20 mg by mouth every evening.     Historical Provider, MD  Tamsulosin HCl (FLOMAX) 0.4 MG CAPS Take 0.8 mg by mouth daily.     Historical Provider, MD  VITAMIN D, ERGOCALCIFEROL, PO Take 1,000 Units by mouth 2 (two) times daily.     Historical Provider, MD    Allergies Codeine and Penicillins  Family History  Problem Relation Age of Onset  . Hypertension Mother   . Cancer Other   . Hyperlipidemia Other   . Hypertension Other   . Other Other     CHF    Social History Social History  Substance Use Topics  . Smoking status: Never Smoker  . Smokeless tobacco: Never Used  . Alcohol use 3.6 - 4.8 oz/week    6 -  8 Glasses of wine per week    Review of Systems Constitutional: No fever/chills Eyes: No visual changes. ENT: No sore throat. Cardiovascular: Denies chest pain. Respiratory: Denies shortness of breath. Gastrointestinal: No abdominal pain.  No nausea, no vomiting.  No diarrhea.  No constipation. Genitourinary: Negative for dysuria. Musculoskeletal: Negative for back pain. Skin: Negative for rash. Neurological: Negative for headaches, focal weakness or numbness.  10-point ROS otherwise negative.  ____________________________________________   PHYSICAL EXAM:  VITAL SIGNS: ED Triage Vitals  Enc Vitals Group     BP 01/05/17 1122 (!) 165/55     Pulse Rate 01/05/17 1122 (!) 58     Resp 01/05/17 1122 16     Temp 01/05/17 1122  97.8 F (36.6 C)     Temp Source 01/05/17 1122 Oral     SpO2 01/05/17 1122 100 %     Weight 01/05/17 1123 185 lb (83.9 kg)     Height --      Head Circumference --      Peak Flow --      Pain Score --      Pain Loc --      Pain Edu? --      Excl. in GC? --     Constitutional: Alert and oriented. Well appearing and in no acute distress. Eyes: Conjunctivae are normal. PERRL. EOMI. Head: Atraumatic. Nose: No congestion/rhinnorhea. Mouth/Throat: Mucous membranes are moist.  Oropharynx non-erythematous. Neck: No stridor.   Cardiovascular: Normal rate, regular rhythm. Grossly normal heart sounds.  Good peripheral circulation. Respiratory: Normal respiratory effort.  No retractions. Lungs CTAB. Gastrointestinal: Soft and nontender. No distention. No abdominal bruits. No CVA tenderness. Musculoskeletal: No lower extremity tenderness nor edema.  No joint effusions. Neurologic:  Normal speech and language.Except for occasional word finding difficulty and occasional difficulty in understanding what I tell him. Through 12 appear to be intact although I did not check visual fields. Cerebellar finger-nose shows a marked right-sided tremor slight tremor on the left intention tremor this is old. Motor strength is 5 over 5 throughout sensation appears to be normal. Skin:  Skin is warm, dry and intact. Patient has an irregularly shaped 1-1/2 cm mole on the left shoulder. .  ____________________________________________   LABS (all labs ordered are listed, but only abnormal results are displayed)  Labs Reviewed  COMPREHENSIVE METABOLIC PANEL - Abnormal; Notable for the following:       Result Value   Total Bilirubin 1.6 (*)    All other components within normal limits  PROTIME-INR  APTT  CBC  DIFFERENTIAL  TROPONIN I  GLUCOSE, CAPILLARY  CBG MONITORING, ED   ____________________________________________  EKG  EKG read and interpreted by me shows sinus bradycardia rate of  54 ____________________________________________  RADIOLOGY  Study Result   CLINICAL DATA:  83 year old male with stroke-like symptoms, headache last night and confused this morning with partially aphasia. Initial encounter.  EXAM: CT HEAD WITHOUT CONTRAST  CT CERVICAL SPINE WITHOUT CONTRAST  TECHNIQUE: Multidetector CT imaging of the head and cervical spine was performed following the standard protocol without intravenous contrast. Multiplanar CT image reconstructions of the cervical spine were also generated.  COMPARISON:  Brain MRI 07/03/2014 and earlier.  FINDINGS: CT HEAD FINDINGS  Brain: Round hyperdense intra-axial hemorrhage centered at the confluence of the posterior left temporal, left parietal and occipital lobes measures 34 by 34 x 45 mm (AP by transverse by CC) for an estimated blood volume of 26 mL. This area was negative on the prior MRI.  There is subarachnoid extension of blood at the left occipital lobe best seen on sagittal image 44. Mass effect on the left occipital horn and atrium of the left lateral ventricle but no intraventricular extension. Surrounding edema. Mild regional mass effect. No midline shift.  No other extra-axial extension. Stable cerebral volume. Stable ventricular prominence. Basilar cisterns are patent. Elsewhere gray-white matter differentiation is within normal limits for age.  Vascular: Calcified atherosclerosis at the skull base.  Skull: Bulky dural calcifications. Hyperostosis frontalis. No acute osseous abnormality identified.  Sinuses/Orbits: Visualized paranasal sinuses and mastoids are stable and well pneumatized.  Other: No acute orbit or scalp soft tissue finding.  CT CERVICAL SPINE FINDINGS  Alignment: Relatively preserved cervical lordosis. Mild anterolisthesis of C7 on T1 appears to be degenerative in nature. Bilateral posterior element alignment is within normal limits.  Skull base and  vertebrae: Visualized skull base is intact. No atlanto-occipital dissociation. No cervical vertebral fracture.  Soft tissues and spinal canal: No prevertebral fluid or swelling. No visible canal hematoma.  Calcified carotid atherosclerosis, otherwise negative noncontrast neck soft tissues.  Disc levels: Moderate to severe lower cervical disc and endplate degeneration. Widespread cervical facet hypertrophy. Mild degenerative lower cervical spinal stenosis at C5-C6 and C6-C7.  Upper chest: Visible upper thoracic levels appear intact. Negative lung apices.  Other:  IMPRESSION: 1. Acute intracranial hemorrhage. Posterior left hemisphere intra-axial hemorrhage with estimated blood volume of 26 mL. Favor small vessel disease related hemorrhage, but query coagulopathy. 2. Associated small volume of subarachnoid hemorrhage about the left occipital lobe, but no intraventricular extension. 3. Surrounding edema with mild regional mass effect. No midline shift, no loss of basilar cisterns, no ventriculomegaly. 4.  No acute fracture or listhesis identified in the cervical spine. 5. Cervical spine degeneration with mild lower cervical spinal stenosis.   Electronically Signed   By: Odessa Fleming M.D.   On: 01/05/2017 11:49    Study Result   CLINICAL DATA:  83 year old male with stroke-like symptoms, headache last night and confused this morning with partially aphasia. Initial encounter.  EXAM: CT HEAD WITHOUT CONTRAST  CT CERVICAL SPINE WITHOUT CONTRAST  TECHNIQUE: Multidetector CT imaging of the head and cervical spine was performed following the standard protocol without intravenous contrast. Multiplanar CT image reconstructions of the cervical spine were also generated.  COMPARISON:  Brain MRI 07/03/2014 and earlier.  FINDINGS: CT HEAD FINDINGS  Brain: Round hyperdense intra-axial hemorrhage centered at the confluence of the posterior left temporal, left  parietal and occipital lobes measures 34 by 34 x 45 mm (AP by transverse by CC) for an estimated blood volume of 26 mL. This area was negative on the prior MRI. There is subarachnoid extension of blood at the left occipital lobe best seen on sagittal image 44. Mass effect on the left occipital horn and atrium of the left lateral ventricle but no intraventricular extension. Surrounding edema. Mild regional mass effect. No midline shift.  No other extra-axial extension. Stable cerebral volume. Stable ventricular prominence. Basilar cisterns are patent. Elsewhere gray-white matter differentiation is within normal limits for age.  Vascular: Calcified atherosclerosis at the skull base.  Skull: Bulky dural calcifications. Hyperostosis frontalis. No acute osseous abnormality identified.  Sinuses/Orbits: Visualized paranasal sinuses and mastoids are stable and well pneumatized.  Other: No acute orbit or scalp soft tissue finding.  CT CERVICAL SPINE FINDINGS  Alignment: Relatively preserved cervical lordosis. Mild anterolisthesis of C7 on T1 appears to be degenerative in nature. Bilateral posterior element alignment is within normal limits.  Skull  base and vertebrae: Visualized skull base is intact. No atlanto-occipital dissociation. No cervical vertebral fracture.  Soft tissues and spinal canal: No prevertebral fluid or swelling. No visible canal hematoma.  Calcified carotid atherosclerosis, otherwise negative noncontrast neck soft tissues.  Disc levels: Moderate to severe lower cervical disc and endplate degeneration. Widespread cervical facet hypertrophy. Mild degenerative lower cervical spinal stenosis at C5-C6 and C6-C7.  Upper chest: Visible upper thoracic levels appear intact. Negative lung apices.  Other:  IMPRESSION: 1. Acute intracranial hemorrhage. Posterior left hemisphere intra-axial hemorrhage with estimated blood volume of 26 mL. Favor small  vessel disease related hemorrhage, but query coagulopathy. 2. Associated small volume of subarachnoid hemorrhage about the left occipital lobe, but no intraventricular extension. 3. Surrounding edema with mild regional mass effect. No midline shift, no loss of basilar cisterns, no ventriculomegaly. 4.  No acute fracture or listhesis identified in the cervical spine. 5. Cervical spine degeneration with mild lower cervical spinal stenosis.   Electronically Signed   By: Odessa Fleming M.D.   On: 01/05/2017 11:49     ____________________________________________   PROCEDURES  Procedure(s) performed:   Procedures  Critical Care performed:   ____________________________________________   INITIAL IMPRESSION / ASSESSMENT AND PLAN / ED COURSE  Pertinent labs & imaging results that were available during my care of the patient were reviewed by me and considered in my medical decision making (see chart for details).  Wife wishes to have him transferred to [facility] where his neurologist as he agrees.    Dr. Bonnell Public H neuro critical care at [facility] except him. He wishes to keep his blood pressure below 160. Use some labetalol to attempt to do this.  ____________________________________________   FINAL CLINICAL IMPRESSION(S) / ED DIAGNOSES  Final diagnoses:  Brain bleed (HCC)      NEW MEDICATIONS STARTED DURING THIS VISIT:  Discharge Medication List as of 01/05/2017  1:19 PM       Note:  This document was prepared using Dragon voice recognition software and may include unintentional dictation errors.    Arnaldo Natal, MD 01/05/17 2209

## 2017-01-05 NOTE — ED Triage Notes (Signed)
Pt came to ED via pov. Per wife, pt had headache last night and this morning pt was having some confusion. Pt unable to get his thoughts out and told his wife he did not remember how to "cut the kitchen light on." Pt unable to identify some objects or state the month or year. Last known well 1930 pm Friday.

## 2017-01-05 NOTE — ED Notes (Signed)
ED Provider at bedside. 

## 2017-01-06 DIAGNOSIS — I610 Nontraumatic intracerebral hemorrhage in hemisphere, subcortical: Secondary | ICD-10-CM | POA: Insufficient documentation

## 2017-01-07 ENCOUNTER — Telehealth: Payer: Self-pay | Admitting: Cardiovascular Disease

## 2017-01-07 DIAGNOSIS — J189 Pneumonia, unspecified organism: Secondary | ICD-10-CM | POA: Insufficient documentation

## 2017-01-07 DIAGNOSIS — R509 Fever, unspecified: Secondary | ICD-10-CM | POA: Insufficient documentation

## 2017-01-07 NOTE — Telephone Encounter (Signed)
Pt wife called, states pt had a HEMORRHAGIC STROKE on Saturday. She states he is at [facility] on the stroke floor, and states "his doctors want to speak with Dr. Mariah MillingGollan". She states they want to know why pt is on Plavix and aspirin. Please call nurse at [phone]. Pt wife requests a call also.

## 2017-01-07 NOTE — Telephone Encounter (Signed)
I called [name] and talked with her today about the details

## 2017-01-07 NOTE — Telephone Encounter (Signed)
Spoke w/ pt's wife. She reports that pt had a stroke on Sat - was taken [facility] then [facility]. He left the ICU yesterday and is on the stroke floor w/ a stroke team caring for him. Pt has been on Plavix and aspirin, she understands that some people are on this combination for a reason. Pt's motor skills are good, but he is having difficulty w/ word processing & has rt sided vision deficit. She states that the doctor(s) would like to discuss pt's care plan w/ Dr. Mariah MillingGollan and request a call from him at his convenience. Advised her that Dr. Mariah MillingGollan is in clinic starting @ 7:40 today and his last pt is scheduled at 4:00, so it will be difficult for him to make time for a phone call today. She asks that I simply make him aware and that if [facility] calls, we try to make accommodations for them to speak.

## 2017-01-17 NOTE — Telephone Encounter (Signed)
Pt wife calling stating pt is [facility] at [facility]  Few nights ago he was having some pains  And the did and EKG It came back normal  Pt wife is asking just to make sure if there is any other testing that they can do to check on those pains They told them it might be some indigestion  Please advise

## 2017-01-17 NOTE — Telephone Encounter (Signed)
If they are afraid of cardiac blockages, they could check a troponin

## 2017-01-18 NOTE — Telephone Encounter (Signed)
Spoke with patients wife and she states that her husband had some chest pain while in rehab and just wanted to know if it would show up on a EKG or lab work. Let her know that they would and she stated that he was going to be discharged home next week and wants to make sure that he follows up with Dr. Mariah MillingGollan. She states that the rehab department will call to schedule follow up and that she wants him to be seen next week. Let her know that we would do our best to schedule when available. She verbalized understanding and had no further questions at this time.

## 2017-01-20 NOTE — Telephone Encounter (Signed)
We can add him on when he is home from rehab, this week is fine if he is home

## 2017-01-21 NOTE — Telephone Encounter (Signed)
Spoke with patient he is coming 01/31/17 at 11 am

## 2017-01-24 ENCOUNTER — Encounter: Payer: Self-pay | Admitting: Physical Therapy

## 2017-01-24 ENCOUNTER — Encounter: Payer: Self-pay | Admitting: Occupational Therapy

## 2017-01-24 ENCOUNTER — Ambulatory Visit: Payer: Medicare Other

## 2017-01-24 ENCOUNTER — Ambulatory Visit: Payer: Medicare Other | Admitting: Occupational Therapy

## 2017-01-24 ENCOUNTER — Ambulatory Visit: Payer: Medicare Other | Attending: Internal Medicine | Admitting: Physical Therapy

## 2017-01-24 DIAGNOSIS — R2681 Unsteadiness on feet: Secondary | ICD-10-CM | POA: Diagnosis present

## 2017-01-24 DIAGNOSIS — R278 Other lack of coordination: Secondary | ICD-10-CM | POA: Diagnosis present

## 2017-01-24 DIAGNOSIS — R4701 Aphasia: Secondary | ICD-10-CM | POA: Insufficient documentation

## 2017-01-24 DIAGNOSIS — R482 Apraxia: Secondary | ICD-10-CM | POA: Insufficient documentation

## 2017-01-24 DIAGNOSIS — R41841 Cognitive communication deficit: Secondary | ICD-10-CM | POA: Insufficient documentation

## 2017-01-24 DIAGNOSIS — R2689 Other abnormalities of gait and mobility: Secondary | ICD-10-CM | POA: Insufficient documentation

## 2017-01-24 DIAGNOSIS — R41842 Visuospatial deficit: Secondary | ICD-10-CM | POA: Insufficient documentation

## 2017-01-24 DIAGNOSIS — M545 Low back pain, unspecified: Secondary | ICD-10-CM

## 2017-01-24 DIAGNOSIS — G8929 Other chronic pain: Secondary | ICD-10-CM | POA: Insufficient documentation

## 2017-01-24 DIAGNOSIS — R251 Tremor, unspecified: Secondary | ICD-10-CM | POA: Insufficient documentation

## 2017-01-24 DIAGNOSIS — M6281 Muscle weakness (generalized): Secondary | ICD-10-CM | POA: Diagnosis present

## 2017-01-24 NOTE — Therapy (Signed)
[facility] [address] [place], [place], [address] Phone: [phone]   Fax:  [phone]  Occupational Therapy Evaluation  Patient Details  Name: [name]. MRN: [id number] Date of Birth: 1933-02-28 Referring Provider: Dr. Wilfrid Lund  Encounter Date: 01/24/2017      OT End of Session - 01/24/17 1652    Visit Number 1   Number of Visits 16   Date for OT Re-Evaluation 03/21/17   Authorization Type medicare will need G code and PN every 10th visit   Authorization Time Period 60 days   Authorization - Visit Number 1   Authorization - Number of Visits 10   OT Start Time 0848   OT Stop Time 0929   OT Time Calculation (min) 41 min   Activity Tolerance Patient tolerated treatment well      Past Medical History:  Diagnosis Date  . BPH (benign prostatic hyperplasia)   . Coronary artery disease   . Hypertension     Past Surgical History:  Procedure Laterality Date  . Intestinal Blockage    . SKIN CANCER EXCISION     x2  . TONSILLECTOMY      There were no vitals filed for this visit.      Subjective Assessment - 01/24/17 0853    Subjective  I can't read right now   Patient is accompained by: Family member  wife Dois Davenport   Pertinent History see epic.  L CVA, R hemiopsia, Parkinson's, essential tremor   Patient Stated Goals I want to get well and be perfect again.  I hate these questions (pt unable to give specific answer)   Currently in Pain? Yes   Pain Score 5    Pain Location Heel   Pain Orientation Left   Pain Descriptors / Indicators Sharp   Pain Type Acute pain   Pain Onset Yesterday   Aggravating Factors  walking   Pain Relieving Factors sitting down           [facility] OT Assessment - 01/24/17 0856      Assessment   Diagnosis L CVA   Referring Provider Dr. Wilfrid Lund   Onset Date 01/04/17   Prior Therapy inpt rehab with ST, OT and PT     Precautions   Precautions Fall     Restrictions   Weight Bearing Restrictions No     Balance Screen   Has the patient fallen in the past 6 months No     Home  Environment   Family/patient expects to be discharged to: Private residence   Living Arrangements Spouse/significant other   Available Help at Discharge Available 24 hours/day   Type of Home House   Home Layout Two level  pt lives on the first level now   [employer] Standard   Additional Comments grab bars around toilet and in shower, shower seat (pt states I don't need it but she makes me use it),      Prior Function   Level of Independence Independent   Vocation Part time employment;Retired   Armed forces operational officer   Leisure yard work, Systems analyst, Human resources officer study     ADL   Eating/Feeding Independent   Grooming Modified independent   Product manager Supervision/safety   Lower Body Bathing Supervision/safety   Upper Body Dressing Increased time;Supervision/safety  pt sits to dress   Lower Body Dressing Increased time;Supervision/safety   Toilet Tranfer Modified independent   Toileting -  Conservator, museum/gallery -  Tour manager Supervision/safety     IADL   Shopping Completely unable to shop   Light Housekeeping Does not participate in any housekeeping tasks   Meal Prep Needs to have meals prepared and served   Prior Level of Function Meal Prep Pt would occassionally grill   Medication Management Is not capable of dispensing or managing own medication   Financial Management Requires assistance     Mobility   Mobility Status Needs assist   Mobility Status Comments supervision in the community due to balance and cognitive deficts     Written Expression   Dominant Hand Right   Handwriting Not legible  for name;  wife/pt report was like this before due to tremor     Vision - History   Baseline Vision Wears glasses all the time     Vision  Assessment   Ocular Range of Motion Within Functional Limits   Tracking/Visual Pursuits Unable to hold eye position out of midline   Saccades Other (comment)  pt unable to follow testing directions   Comment Pt with R heminopsia     Activity Tolerance   Activity Tolerance Tolerate 30+ min activity without fatigue     Cognition   Overall Cognitive Status Impaired/Different from baseline   Mini Mental State Exam  Cognition to be further assessed. Pt with significant attentional issues impacting all other skills;  poor awareness of deficits.    Area of Impairment Attention;Memory;Safety/judgement;Awareness;Problem solving     Sensation   Light Touch Appears Intact   Hot/Cold Appears Intact   Proprioception Appears Intact     Coordination   Gross Motor Movements are Fluid and Coordinated Yes   Fine Motor Movements are Fluid and Coordinated No   9 Hole Peg Test Right;Left   Right 9 Hole Peg Test 34.15   Left 9 Hole Peg Test 28.72   Box and Blocks 38   Tremors essential intention tremor present before stroke.  Sinemet just initiated.      Perception   Perception Impaired     Praxis   Praxis Impaired  to be further assessed     Tone   Assessment Location Right Upper Extremity     ROM / Strength   AROM / PROM / Strength AROM;Strength     AROM   Overall AROM  Within functional limits for tasks performed     Strength   Overall Strength Within functional limits for tasks performed     Hand Function   Right Hand Gross Grasp Functional   Right Hand Grip (lbs) 100   Left Hand Gross Grasp Functional   Left Hand Grip (lbs) 85     RUE Tone   RUE Tone Within Functional Limits  very mild "catch" at end range with velocity                           OT Short Term Goals - 01/24/17 1301      OT SHORT TERM GOAL #1   Title Pt and wife will be mod I with HEP - 02/21/2017   Status New     OT SHORT TERM GOAL #2   Title Pt will be mod I with shower transfers    Status New     OT SHORT TERM GOAL #3   Title Pt will be mod I with bathing   Status New  OT SHORT TERM GOAL #4   Title Pt will complete table top scanning activity (not lanaguage or letter based) with 75% accuracy   Status New     OT SHORT TERM GOAL #5   Title Pt will attend to familiar task in busy environment with no more than min vc's.     Status New           OT Long Term Goals - 01/24/17 1303      OT LONG TERM GOAL #1   Title Pt and wife will be mod I with ugraded HEP prn - 03/21/2017   Status New     OT LONG TERM GOAL #2   Title Pt will demonstrate improved coordinaton by decreasing time on 9 hole peg test by 4 seconds (baseline=34.15)   Status New     OT LONG TERM GOAL #3   Title Pt will be able to print/write name legibly AE prn   Status New     OT LONG TERM GOAL #4   Title Pt will demonstate ability to maintain balance on outdoor uneven surfaces in preparation for returning to simple yard work.    Status New     OT LONG TERM GOAL #5   Title Pt will require no more than 2 vc's to recall information from therapy session   Status New     OT LONG TERM GOAL #6   Title Pt will complete table top scanning activity (not language based) with 90% accuracy   Status New     OT LONG TERM GOAL #8   Title Pt will complete environmental scanning task with at least 75% accuracy   Status New               Plan - 01/24/17 1310    Clinical Impression Statement Pt is an 83 year old male s/p L parietal ICH on 01/05/2017.  Pt was hospitalized and also had inpatient [facility]hab stay [facility].  Pt discharged home with wife with 24 hour supervision. PMH: HTN, seizure, essential tremor with Parkison like movements (just started on Sinemet), ASCVD, DDD, vertigo, CAD with stent in '07, R ICA stenosis.  Pt presents today with following impairments that impact his ability to complete ADL, IADL, leisure and work:  RUE incoordiantion with tremor, decreased functional use of RUE,  R hemianopsia, impaired perception, apraxia, decreased balance, decreased cognition.  Pt will benefit from skilled OT to address these deficits to maximize independence.    Rehab Potential Good   OT Frequency 2x / week   OT Duration 8 weeks   OT Treatment/Interventions Self-care/ADL training;Electrical Stimulation;Therapeutic exercise;Neuromuscular education;DME and/or AE instruction;Energy conservation;Building services engineerunctional Mobility Training;Therapeutic activities;Cognitive remediation/compensation;Visual/perceptual remediation/compensation;Patient/family education;Balance training   Plan review goals, initiate HEP   Consulted and Agree with Plan of Care Patient;Family member/caregiver   Family Member Consulted wife [name]      Patient will benefit from skilled therapeutic intervention in order to improve the following deficits and impairments:  Decreased balance, Decreased cognition, Decreased coordination, Decreased safety awareness, Decreased mobility, Decreased knowledge of use of DME, Impaired UE functional use, Impaired vision/preception  Visit Diagnosis: Tremor - Plan: Ot plan of care cert/re-cert  Unsteadiness on feet - Plan: Ot plan of care cert/re-cert  Other lack of coordination - Plan: Ot plan of care cert/re-cert  Visuospatial deficit - Plan: Ot plan of care cert/re-cert  Apraxia - Plan: Ot plan of care cert/re-cert      G-Codes - 01/24/17 1655    Functional Assessment  Tool Used (Outpatient only) 9 hole peg, skilled clinical observation   Functional Limitation Self care  IADL   Self Care Current Status [phone]) At least 60 percent but less than 80 percent impaired, limited or restricted   Self Care Goal Status (U0454) At least 20 percent but less than 40 percent impaired, limited or restricted      Problem List Patient Active Problem List   Diagnosis Date Noted  . Bradycardia 11/10/2015  . Essential tremor 06/03/2015  . Dizziness 08/21/2012  . Syncope 04/10/2011  . CALF  PAIN, LEFT 10/11/2010  . DYSPEPSIA 07/31/2010  . CHEST DISCOMFORT 07/31/2010  . Hyperlipidemia 05/16/2010  . HYPERTENSION, BENIGN 05/16/2010  . CAD, NATIVE VESSEL 05/16/2010    Norton Pastel, OTR/L 01/24/2017, 5:33 PM  [facility] [address] [place], [place], [address] Phone: [phone]   Fax:  [phone]  Name: Gaberial Cada [facility]. MRN: [id number] Date of Birth: 10/18/1933

## 2017-01-24 NOTE — Patient Instructions (Signed)
  Play "I Spy" game Name 3->5->7 items in simple categories -- fruits, veggies, drinks, colors, states, cities, furniture, tools, etc

## 2017-01-24 NOTE — Therapy (Signed)
[facility] [address] [place], [place], [address] Phone: [phone]   Fax:  [phone]  Physical Therapy Evaluation  Patient Details  Name: [name]. MRN: [id number] Date of Birth: 10/01/1933 Referring Provider: Lorretta Harp, MD  Encounter Date: 01/24/2017      PT End of Session - 01/24/17 1705    Visit Number 1   Number of Visits 17   Date for PT Re-Evaluation 03/22/17   PT Start Time 0757   PT Stop Time 0845   PT Time Calculation (min) 48 min   Equipment Utilized During Treatment Gait belt   Activity Tolerance Patient tolerated treatment well;Patient limited by pain   Behavior During Therapy [facility] for tasks assessed/performed      Past Medical History:  Diagnosis Date  . BPH (benign prostatic hyperplasia)   . Coronary artery disease   . Hypertension     Past Surgical History:  Procedure Laterality Date  . Intestinal Blockage    . SKIN CANCER EXCISION     x2  . TONSILLECTOMY      There were no vitals filed for this visit.       Subjective Assessment - 01/24/17 0756    Subjective This 83yo male had headache on 01/04/2017 and awoke 01/05/2017 with confusion. He was admitted to [facility] with subcortical hemorrhage of left cerebral hemisphere. He was at [facility] 01/09/17-01/21/2017. [facility] was referred for [facility] for PT, OT & Sp.    Patient is accompained by: Family member  wife, Dois Pollard   Pertinent History Subcortical hemorrhage of left cerebral hemisphere (01/05/2017), parkinson's, [facility] Spotted Fever, Basal cell CA, squamous cell CA, DDD cervical, OA lumbar, HTN, BPH, vertigo, CAD, peripheral neuropathy   Limitations Standing;Walking;House hold activities   Patient Stated Goals Walk in community, workout at Club, treadmill, work in yard,    Currently in Pain? Yes   Pain Score 5    Pain Location Back   Pain Orientation Lower   Pain Descriptors / Indicators  Aching   Pain Type Chronic pain   Pain Onset More than a month ago   Pain Frequency Intermittent   Aggravating Factors  standing, walking. Prior to Feb, LBP with yard work or strenous activities. Now standing & walking causes LBP   Pain Relieving Factors sitting with back support.    Effect of Pain on Daily Activities Limits standing & walking            [facility] PT Assessment - 01/24/17 0800      Assessment   Medical Diagnosis Subcortical hemorrhage left cerebral hemisphere   Referring Provider Lorretta Harp, MD   Onset Date/Surgical Date 01/05/17   Hand Dominance Right  golf left handed   Prior Therapy [facility]     Precautions   Precautions Fall  not to drive,      Balance Screen   Has the patient fallen in the past 6 months No   Has the patient had a decrease in activity level because of a fear of falling?  No  cautious   Is the patient reluctant to leave their home because of a fear of falling?  No     Home Environment   Living Environment Private residence   Living Arrangements Spouse/significant other   Type of Home House   Home Access Stairs to enter   Entrance Stairs-Number of Steps 2   Entrance Stairs-Rails Left   Home Layout Two level;Able to live  on main level with bedroom/bathroom;Full bath on main level  extra bedrooms & office upstairs   Alternate Level Stairs-Number of Steps 16-20   Alternate Level Stairs-Rails Right   Home Equipment Walker - 2 wheels;Cane - single point;Shower seat;Grab bars - tub/shower;Hand held shower head     Prior Function   Level of Independence Independent;Independent with household mobility without device;Independent with community mobility without device   Vocation Retired;Part time employment   Vocation Requirements real estate sales land   Leisure yard work, golf, bible study,      Observation/Other Assessments   Focus on Therapeutic Outcomes (FOTO)  51.58 Functional Status   Stroke Impact Scale  80.6%   Fear  Avoidance Belief Questionnaire (FABQ)  41 (10)     ROM / Strength   AROM / PROM / Strength AROM;Strength     AROM   Overall AROM  Within functional limits for tasks performed   Overall AROM Comments LEs WFL     Strength   Overall Strength Deficits   Overall Strength Comments Tested in sitting hip & knee bil. 5/5   Strength Assessment Site Ankle   Right/Left Ankle Left   Left Ankle Dorsiflexion 4/5   Left Ankle Plantar Flexion 3-/5   Left Ankle Inversion 4/5   Left Ankle Eversion 4/5     Transfers   Transfers Sit to Stand;Stand to Sit   Sit to Stand 6: Modified independent (Device/Increase time);With upper extremity assist;With armrests;From chair/3-in-1  multiple attempts without UE assist,    Stand to Sit 6: Modified independent (Device/Increase time);With upper extremity assist;To chair/3-in-1     Ambulation/Gait   Ambulation/Gait Yes   Ambulation/Gait Assistance 4: Min assist;5: Supervision  MinA for FGA   Ambulation Distance (Feet) 300 Feet   Assistive device None   Gait Pattern Decreased arm swing - right;Decreased stride length;Decreased stance time - left   Ambulation Surface Indoor;Level   Gait velocity 2.95 ft/sec   Stairs Yes   Stairs Assistance 5: Supervision   Stair Management Technique Two rails;One rail Right;Alternating pattern;Forwards   Number of Stairs 4   Ramp 4: Min assist   Curb 4: Min assist     Standardized Balance Assessment   Standardized Balance Assessment Berg Balance Test;Timed Up and Go Test     Berg Balance Test   Sit to Stand Able to stand  independently using hands   Standing Unsupported Able to stand safely 2 minutes   Sitting with Back Unsupported but Feet Supported on Floor or Stool Able to sit safely and securely 2 minutes   Stand to Sit Sits safely with minimal use of hands   Transfers Able to transfer safely, minor use of hands   Standing Unsupported with Eyes Closed Able to stand 10 seconds with supervision   Standing  Ubsupported with Feet Together Able to place feet together independently but unable to hold for 30 seconds   From Standing, Reach Forward with Outstretched Arm Can reach forward >5 cm safely (2")   From Standing Position, Pick up Object from Floor Able to pick up shoe, needs supervision   From Standing Position, Turn to Look Behind Over each Shoulder Looks behind one side only/other side shows less weight shift   Turn 360 Degrees Able to turn 360 degrees safely but slowly   Standing Unsupported, Alternately Place Feet on Step/Stool Able to complete 4 steps without aid or supervision   Standing Unsupported, One Foot in Front Able to plae foot ahead of the other independently  and hold 30 seconds   Standing on One Leg Tries to lift leg/unable to hold 3 seconds but remains standing independently   Total Score 40     Timed Up and Go Test   Normal TUG (seconds) 9.37   Cognitive TUG (seconds) 15.63  stops counting     Functional Gait  Assessment   Gait assessed  Yes   Gait Level Surface Walks 20 ft in less than 7 sec but greater than 5.5 sec, uses assistive device, slower speed, mild gait deviations, or deviates 6-10 in outside of the 12 in walkway width.   Change in Gait Speed Makes only minor adjustments to walking speed, or accomplishes a change in speed with significant gait deviations, deviates 10-15 in outside the 12 in walkway width, or changes speed but loses balance but is able to recover and continue walking.   Gait with Horizontal Head Turns Performs head turns with moderate changes in gait velocity, slows down, deviates 10-15 in outside 12 in walkway width but recovers, can continue to walk.   Gait with Vertical Head Turns Performs task with moderate change in gait velocity, slows down, deviates 10-15 in outside 12 in walkway width but recovers, can continue to walk.   Gait and Pivot Turn Pivot turns safely in greater than 3 sec and stops with no loss of balance, or pivot turns safely  within 3 sec and stops with mild imbalance, requires small steps to catch balance.   Step Over Obstacle Is able to step over one shoe box (4.5 in total height) but must slow down and adjust steps to clear box safely. May require verbal cueing.   Gait with Narrow Base of Support Ambulates less than 4 steps heel to toe or cannot perform without assistance.   Gait with Eyes Closed Walks 20 ft, slow speed, abnormal gait pattern, evidence for imbalance, deviates 10-15 in outside 12 in walkway width. Requires more than 9 sec to ambulate 20 ft.   Ambulating Backwards Walks 20 ft, uses assistive device, slower speed, mild gait deviations, deviates 6-10 in outside 12 in walkway width.   Steps Alternating feet, must use rail.   Total Score 13      Mini-BESTest: Balance Evaluation Systems Test  2005-2013 [employer] [employer]. All rights reserved. ______________________________________________________________________*Anticipatory  Subscore  2/6 1. SIT TO STAND 1 Instruction: "Cross your arms across your chest. Try not to use your hands unless you must.Do not let your legs lean against the back of the chair when you stand. Please stand up now." (2) Normal: Comes to stand without use of hands and stabilizes independently. (1) Moderate: Comes to stand WITH use of hands on first attempt. (0) Severe: Unable to stand up from chair without assistance, OR needs several attempts with use of hands. 2. RISE TO TOES 1 Instruction: "Place your feet shoulder width apart. Place your hands on your hips. Try to rise as high as you can onto your toes. I will count out loud to 3 seconds. Try to hold this pose for at least 3 seconds. Look straight ahead. Rise now." (2) Normal: Stable for 3 s with maximum height. (1) Moderate: Heels up, but not full range (smaller than when holding hands), OR noticeable instability for 3 s. (0) Severe: < 3 s. 3. STAND ON ONE LEG 0 Instruction: "Look straight ahead. Keep your  hands on your hips. Lift your leg off of the ground behind you without touching or resting your raised leg upon your  other standing leg. Stay standing on one leg as long as you can. Look straight ahead. Lift now." Left: Time in Seconds Trial 1:1Trial 2: 1 (2) Normal: 20 s. (1) Moderate: < 20 s. (0) Severe: Unable. Right: Time in Seconds Trial 1: 3Trial 2: 2 (2) Normal: 20 s. (1) Moderate: < 20 s. (0) Severe: Unable To score each side separately use the trial with the longest time. To calculate the sub-score and total score use the side [left or right] with the lowest numerical score [i.e. the worse side]. ______________________________________________________________________________________Reactive Postural Control___________Subscore:  0/6 4. COMPENSATORY STEPPING CORRECTION- FORWARD 0 Instruction: "Stand with your feet shoulder width apart, arms at your sides. Lean forward against my hands beyond your forward limits. When I let go, do whatever is necessary, including taking a step, to avoid a fall." (2) Normal: Recovers independently with a single, large step (second realignment step is allowed). (1) Moderate: More than one step used to recover equilibrium. (0) Severe: No step, OR would fall if not caught, OR falls spontaneously. 5. COMPENSATORY STEPPING CORRECTION- BACKWARD 0 Instruction: "Stand with your feet shoulder width apart, arms at your sides. Lean backward against my hands beyond your backward limits. When I let go, do whatever is necessary, including taking a step, to avoid a fall." (2) Normal: Recovers independently with a single, large step. (1) Moderate: More than one step used to recover equilibrium. (0) Severe: No step, OR would fall if not caught, OR falls spontaneously. 6. COMPENSATORY STEPPING CORRECTION- LATERAL 0 Instruction: "Stand with your feet together, arms down at your sides. Lean into my hand beyond your sideways limit. When I let go, do whatever is necessary,  including taking a step, to avoid a fall." Left (2) Normal: Recovers independently with 1 step (crossover or lateral OK). (1) Moderate: Several steps to recover equilibrium. (0) Severe: Falls, or cannot step. Right (2) Normal: Recovers independently with 1 step (crossover or lateral OK). (1) Moderate: Several steps to recover equilibrium. (0) Severe: Falls, or cannot step. Use the side with the lowest score to calculate sub-score and total score. ____________________________________________________________________________________Sensory Orientation_____________Subscore:  2/6 7. STANCE (FEET TOGETHER); EYES OPEN, FIRM SURFACE 2 Instruction: "Place your hands on your hips. Place your feet together until almost touching. Look straight ahead. Be as stable and still as possible, until I say stop." Time in seconds:________ (2) Normal: 30 s. (1) Moderate: < 30 s. (0) Severe: Unable. 8. STANCE (FEET TOGETHER); EYES CLOSED, FOAM SURFACE 0 Instruction: "Step onto the foam. Place your hands on your hips. Place your feet together until almost touching. Be as stable and still as possible, until I say stop. I will start timing when you close your eyes." Time in seconds:__16______ (2) Normal: 30 s. (1) Moderate: < 30 s. (0) Severe: Unable. 9. INCLINE- EYES CLOSED 0 Instruction: "Step onto the incline ramp. Please stand on the incline ramp with your toes toward the top. Place your feet shoulder width apart and have your arms down at your sides. I will start timing when you close your eyes." Time in seconds:_____14___ (2) Normal: Stands independently 30 s and aligns with gravity. (1) Moderate: Stands independently <30 s OR aligns with surface. (0) Severe: Unable. _________________________________________________________________________________________Dynamic Gait ______Subscore  4/10 10. CHANGE IN GAIT SPEED 1 Instruction: "Begin walking at your normal speed, when I tell you 'fast', walk as fast  as you can. When I say 'slow', walk very slowly." (2) Normal: Significantly changes walking speed without imbalance. (1) Moderate: Unable to change walking speed  or signs of imbalance. (0) Severe: Unable to achieve significant change in walking speed AND signs of imbalance. 11. WALK WITH HEAD TURNS - HORIZONTAL 1 Instruction: "Begin walking at your normal speed, when I say "right", turn your head and look to the right. When I say "left" turn your head and look to the left. Try to keep yourself walking in a straight line." (2) Normal: performs head turns with no change in gait speed and good balance. (1) Moderate: performs head turns with reduction in gait speed. (0) Severe: performs head turns with imbalance. 12. WALK WITH PIVOT TURNS 1 Instruction: "Begin walking at your normal speed. When I tell you to 'turn and stop', turn as quickly as you can, face the opposite direction, and stop. After the turn, your feet should be close together." (2) Normal: Turns with feet close FAST (< 3 steps) with good balance. (1) Moderate: Turns with feet close SLOW (>4 steps) with good balance. (0) Severe: Cannot turn with feet close at any speed without imbalance. 13. STEP OVER OBSTACLES 1 Instruction: "Begin walking at your normal speed. When you get to the box, step over it, not around it and keep walking." (2) Normal: Able to step over box with minimal change of gait speed and with good balance. (1) Moderate: Steps over box but touches box OR displays cautious behavior by slowing gait. (0) Severe: Unable to step over box OR steps around box. 14. TIMED UP & GO WITH DUAL TASK [3 METER WALK] 0 Instruction TUG: "When I say 'Go', stand up from chair, walk at your normal speed across the tape on the floor, turn around, and come back to sit in the chair." Instruction TUG with Dual Task: "Count backwards by threes starting at ___. When I say 'Go', stand up from chair, walk at your normal speed across the tape on  the floor, turn around, and come back to sit in the chair. Continue counting backwards the entire time." TUG: ____11.09____seconds; Dual Task TUG: __15.63______seconds (2) Normal: No noticeable change in sitting, standing or walking while backward counting when compared to TUG without Dual Task. (1) Moderate: Dual Task affects either counting OR walking (>10%) when compared to the TUG without Dual Task. (0) Severe: Stops counting while walking OR stops walking while counting. When scoring item 14, if subject's gait speed slows more than 10% between the TUG without and with a Dual Task the score should be decreased by a point. TOTAL SCORE:   8/28                         PT Short Term Goals - 01/24/17 1709      PT SHORT TERM GOAL #1   Title Patient & wife demonstrate understanding of initial HEP. (Target Date: 02/22/2017)   Time 4   Period Weeks   Status New     PT SHORT TERM GOAL #2   Title Cognitive Timed Up-Go <12 seconds.  (Target Date: 02/22/2017)   Time 4   Period Weeks   Status New     PT SHORT TERM GOAL #3   Title Patient ambulates with head turns without balance loss & maintaining path.  (Target Date: 02/22/2017)   Time 4   Period Weeks   Status New     PT SHORT TERM GOAL #4   Title Berg Balance >45/56 to indicate lower fall risk.   (Target Date: 02/22/2017)   Time 4   Period Weeks  Status New           PT Long Term Goals - 02-02-2017 1731      PT LONG TERM GOAL #1   Title Patient demonstrates with wife's cueing ongoing exercise plan. (Target Date: 03/22/2017)   Time 8   Period Weeks   Status New     PT LONG TERM GOAL #2   Title Berg Balance >/= 50/56 to indicate lower fall risk. (Target Date: 03/22/2017)   Time 8   Period Weeks   Status New     PT LONG TERM GOAL #3   Title Cognitive Timed Up-Go increases <25% from standard TUG to indicate lower fall risk.  (Target Date: 03/22/2017)   Time 8   Period Weeks   Status New     PT LONG TERM  GOAL #4   Title Functional Gait Assessment >20/30 indicating lower fall risk.  (Target Date: 03/22/2017)   Time 8   Period Weeks   Status New     PT LONG TERM GOAL #5   Title MiniBESTest >14/28 indicating lower fall risk.  (Target Date: 03/22/2017)   Time 8   Period Weeks   Status New     Additional Long Term Goals   Additional Long Term Goals Yes     PT LONG TERM GOAL #6   Title Patient reports low back pain with standing & walking </= 3/10 (Target Date: 03/22/2017)   Time 8   Period Weeks   Status New               Plan - Feb 02, 2017 1706    Clinical Impression Statement This 83yo has significant history of medical issues including Parkinson's intention temor, peripheral neuropathy and low back pain with heavier intense activities like yard work. He was functioning at community level without device with variable cadence prior to subcortical hemorrhage left cerebral hemisphere on 01/05/2017. He has weakness in left ankle impacting balance & mobility. He has high fall risk as noted by [employer] 40/56. He has cognitive impairments impacting his gait with Cognitive Timed Up-Go 56.4% increase from his standard TUG. Functional Gait Assessment 13/30 and MiniBESTest indicate high fall risk with gait. Patient appears would benefit from skilled PT to improve safety & mobilty.    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), parkinson's, [facility] Spotted Fever, Basal cell CA, squamous cell CA, DDD cervical, OA lumbar, HTN, BPH, vertigo, CAD, peripheral neuropathy   PT Frequency 2x / week   PT Duration 8 weeks   PT Treatment/Interventions ADLs/Self Care Home Management;Gait training;Stair training;Functional mobility training;Therapeutic activities;Therapeutic exercise;Balance training;Neuromuscular re-education;Patient/family education;Ultrasound;Moist Heat;Manual techniques   PT Next Visit Plan HEP  for balance   Consulted and Agree with Plan of Care Patient;Family member/caregiver   Family Member Consulted wife, Dois Pollard      Patient will benefit from skilled therapeutic intervention in order to improve the following deficits and impairments:  Abnormal gait, Decreased activity tolerance, Decreased balance, Decreased mobility, Decreased strength, Pain  Visit Diagnosis: Other abnormalities of gait and mobility  Unsteadiness on feet  Chronic midline low back pain without sciatica  Muscle weakness (generalized)      G-Codes - Feb 02, 2017 1720    Functional Assessment Tool Used (Outpatient Only) Timed Up Go 9.37 sec & Cognitive TUG 15.63sec; Berg Balance 40/56, Functional Gait Assessment 13/30   Functional Limitation Mobility: Walking and moving around   Mobility: Walking and Moving Around Current Status [phone])  At least 40 percent but less than 60 percent impaired, limited or restricted   Mobility: Walking and Moving Around Goal Status [phone]) At least 20 percent but less than 40 percent impaired, limited or restricted       Problem List Patient Active Problem List   Diagnosis Date Noted  . Bradycardia 11/10/2015  . Essential tremor 06/03/2015  . Dizziness 08/21/2012  . Syncope 04/10/2011  . CALF PAIN, LEFT 10/11/2010  . DYSPEPSIA 07/31/2010  . CHEST DISCOMFORT 07/31/2010  . Hyperlipidemia 05/16/2010  . HYPERTENSION, BENIGN 05/16/2010  . CAD, NATIVE VESSEL 05/16/2010    Tayia Stonesifer PT, DPT 01/24/2017, 5:39 PM  [place] [facility] [address] [place], [place], [address] Phone: [phone]   Fax:  [phone]  Name: Hilda Wexler [facility]. MRN: [id number] Date of Birth: Jan 02, 1933

## 2017-01-24 NOTE — Therapy (Signed)
[facility] [address] [place], [place], [address] Phone: [phone]   Fax:  [phone]  Speech Language Pathology Evaluation  Patient Details  Name: [name]. MRN: [id number] Date of Birth: Mar 22, 1933 Referring Provider: Lorretta Harp, MD  Encounter Date: 01/24/2017      End of Session - 01/24/17 1657    Visit Number 1   Number of Visits 17   Date for SLP Re-Evaluation 03/29/17   SLP Start Time 0933   SLP Stop Time  1018   SLP Time Calculation (min) 45 min   Activity Tolerance Patient tolerated treatment well      Past Medical History:  Diagnosis Date  . BPH (benign prostatic hyperplasia)   . Coronary artery disease   . Hypertension     Past Surgical History:  Procedure Laterality Date  . Intestinal Blockage    . SKIN CANCER EXCISION     x2  . TONSILLECTOMY      There were no vitals filed for this visit.      Subjective Assessment - 01/24/17 0940    Subjective "Well there's lots of things I can't do - I can't read. Period."   Patient is accompained by: --  wife   Currently in Pain? Yes   Pain Score 3    Pain Location Heel   Pain Orientation Left   Pain Descriptors / Indicators Sharp   Pain Type Acute pain   Pain Onset Yesterday   Aggravating Factors  walking   Pain Relieving Factors sitting down            SLP Evaluation [facility] - 01/24/17 0940      SLP Visit Information   SLP Received On 01/24/17   Referring Provider Lorretta Harp, MD   Onset Date 01-05-17   Medical Diagnosis CVA     General Information   HPI Pt with history of Parkinsonism, (seeing Dr. Lorin Picket at [facility]), with recent CVA affecting verbal language/word finding, reading comprehension, auditory comprehension, cognition deficits.     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     Cognition   Overall Cognitive Status Impaired/Different from baseline   Memory Impaired   Memory Impairment Decreased short term memory     Auditory Comprehension   Overall Auditory Comprehension Appears within functional limits for tasks assessed     Reading Comprehension   Reading Status --  reportedly impaired however pt 100% with boston naming test     Verbal Expression   Overall Verbal Expression Impaired   Level of Generative/Spontaneous Verbalization Conversation  pausing/hesitation (mild) in simple conversation   Naming Impairment   Confrontation --  [employer] 19/60 - below 2 SD below mean   Other Verbal Expression Comments Pt with functinal language at simple linguistic level, as linguistic complexity increases pt's verbal expression decreases.     Oral Motor/Sensory Function   Overall Oral Motor/Sensory Function Appears within functional limits for tasks assessed     Motor Speech   Overall Motor Speech Appears within functional limits for tasks assessed                         SLP Education - 01/24/17 1657    Education provided Yes   Education Details defict area/s, home tasks, use description to A word finding   Person(s) Educated Patient;Spouse  Methods Demonstration;Explanation   Comprehension Verbalized understanding;Returned demonstration          SLP Short Term Goals - 01/24/17 1701      SLP SHORT TERM GOAL #1   Title pt will name average 10 items in concrete category in 60 seconds, over two sessions   Time 4   Period Weeks   Status New     SLP SHORT TERM GOAL #2   Title pt will demo compensations for expressive language in functional 7 minute mod complex conversation    Time 4   Period Weeks   Status New     SLP SHORT TERM GOAL #3   Title pt will undergo assessment of reading in first 3 therapy sessions   Time 2   Period Weeks   Status New          SLP Long Term Goals - 01/24/17 1703      SLP LONG TERM GOAL #1   Title  pt will use compensations for functional 10 minute mod-max complex conversation   Time 8   Period Weeks   Status New     SLP LONG TERM GOAL #2   Title  pt will demo [facility] tasks requiring memory compensations (meds, appointments, etc)   Time 8   Period Weeks   Status New          Plan - 01/24/17 1658    Clinical Impression Statement Pt presents to ST today with anomic aphasia, with functional simple conversation (mild hesitations). AS linguistic demand increases, pt's verbal expression ability decreases. Pt noted to compensate by providing descriptive strategy during [employer] Naming Test. SLP encouraged pt to use this to A him in word-finding. Pt with likely cognitive lingustic deficts as well, and pt/wife report reading comprehension deficits. Skilled ST is recommended to improve pt's language skills as well as cognitive linguistic skills.   Speech Therapy Frequency 2x / week   Duration --  8 weeks   Treatment/Interventions Language facilitation;Internal/external aids;SLP instruction and feedback;Multimodal communcation approach;Functional tasks;Cognitive reorganization;Oral motor exercises;Cueing hierarchy;Patient/family education;Compensatory techniques  some or all may be used   Potential to Achieve Goals Good   Consulted and Agree with Plan of Care Patient      Patient will benefit from skilled therapeutic intervention in order to improve the following deficits and impairments:   Aphasia  Cognitive communication deficit      G-Codes - 01/24/17 1705    Functional Assessment Tool Used NOMS- 5   Functional Limitations Spoken language expressive   Spoken Language Expression Current Status [phone](G9162) At least 20 percent but less than 40 percent impaired, limited or restricted   Spoken Language Expression Goal Status ([id number](G9163) At least 1 percent but less than 20 percent impaired, limited or restricted      Problem List Patient Active Problem List   Diagnosis Date Noted  .  Bradycardia 11/10/2015  . Essential tremor 06/03/2015  . Dizziness 08/21/2012  . Syncope 04/10/2011  . CALF PAIN, LEFT 10/11/2010  . DYSPEPSIA 07/31/2010  . CHEST DISCOMFORT 07/31/2010  . Hyperlipidemia 05/16/2010  . HYPERTENSION, BENIGN 05/16/2010  . CAD, NATIVE VESSEL 05/16/2010    [facility] HospitalCHINKE,Roen Macgowan ,MS, CCC-SLP  01/24/2017, 5:06 PM  [facility] [address] [place], [place], [address] Phone: [phone]   Fax:  [phone]  Name: Scarlette ShortsGeorge Stanford [facility] Jr. MRN: [id number] Date of Birth: Apr 25, 1933

## 2017-02-01 ENCOUNTER — Ambulatory Visit (INDEPENDENT_AMBULATORY_CARE_PROVIDER_SITE_OTHER): Payer: Medicare Other | Admitting: Cardiovascular Disease

## 2017-02-01 ENCOUNTER — Ambulatory Visit: Payer: Medicare Other | Attending: Physical Medicine and Rehabilitation | Admitting: Physical Therapy

## 2017-02-01 ENCOUNTER — Encounter: Payer: Self-pay | Admitting: Physical Therapy

## 2017-02-01 ENCOUNTER — Telehealth: Payer: Self-pay | Admitting: Cardiovascular Disease

## 2017-02-01 ENCOUNTER — Ambulatory Visit: Payer: Medicare Other

## 2017-02-01 ENCOUNTER — Encounter: Payer: Self-pay | Admitting: Cardiovascular Disease

## 2017-02-01 VITALS — BP 123/62 | HR 61

## 2017-02-01 VITALS — BP 100/62 | HR 53 | Ht 70.5 in | Wt 184.2 lb

## 2017-02-01 DIAGNOSIS — R41842 Visuospatial deficit: Secondary | ICD-10-CM | POA: Insufficient documentation

## 2017-02-01 DIAGNOSIS — I639 Cerebral infarction, unspecified: Secondary | ICD-10-CM

## 2017-02-01 DIAGNOSIS — M545 Low back pain, unspecified: Secondary | ICD-10-CM

## 2017-02-01 DIAGNOSIS — I6521 Occlusion and stenosis of right carotid artery: Secondary | ICD-10-CM

## 2017-02-01 DIAGNOSIS — I10 Essential (primary) hypertension: Secondary | ICD-10-CM | POA: Diagnosis not present

## 2017-02-01 DIAGNOSIS — R55 Syncope and collapse: Secondary | ICD-10-CM

## 2017-02-01 DIAGNOSIS — M6281 Muscle weakness (generalized): Secondary | ICD-10-CM | POA: Diagnosis present

## 2017-02-01 DIAGNOSIS — E785 Hyperlipidemia, unspecified: Secondary | ICD-10-CM

## 2017-02-01 DIAGNOSIS — I2510 Atherosclerotic heart disease of native coronary artery without angina pectoris: Secondary | ICD-10-CM | POA: Diagnosis not present

## 2017-02-01 DIAGNOSIS — R251 Tremor, unspecified: Secondary | ICD-10-CM | POA: Insufficient documentation

## 2017-02-01 DIAGNOSIS — R2681 Unsteadiness on feet: Secondary | ICD-10-CM | POA: Diagnosis present

## 2017-02-01 DIAGNOSIS — R2689 Other abnormalities of gait and mobility: Secondary | ICD-10-CM | POA: Diagnosis present

## 2017-02-01 DIAGNOSIS — R278 Other lack of coordination: Secondary | ICD-10-CM | POA: Insufficient documentation

## 2017-02-01 DIAGNOSIS — R482 Apraxia: Secondary | ICD-10-CM | POA: Insufficient documentation

## 2017-02-01 DIAGNOSIS — G8929 Other chronic pain: Secondary | ICD-10-CM

## 2017-02-01 DIAGNOSIS — R41841 Cognitive communication deficit: Secondary | ICD-10-CM

## 2017-02-01 DIAGNOSIS — R4701 Aphasia: Secondary | ICD-10-CM

## 2017-02-01 DIAGNOSIS — R001 Bradycardia, unspecified: Secondary | ICD-10-CM

## 2017-02-01 DIAGNOSIS — G250 Essential tremor: Secondary | ICD-10-CM

## 2017-02-01 HISTORY — DX: Occlusion and stenosis of right carotid artery: I65.21

## 2017-02-01 HISTORY — DX: Cerebral infarction, unspecified: I63.9

## 2017-02-01 MED ORDER — EZETIMIBE 10 MG PO TABS: 10.0000 mg | ORAL_TABLET | Freq: Every day | ORAL | 3 refills | Status: DC

## 2017-02-01 NOTE — Patient Instructions (Signed)
(  Home) Squat: (Assist)    Using supports, arms close to body, squat by dropping hips back as if sitting in a chair. Repeat __10__ times per set. Do _2___ sets per session. Do _2-3___ sessions per day  Feet Heel-Toe "Tandem"    One hand touching countertop, walk a straight line bringing one foot directly in front of the other. Repeat for _10 steps forwards and then backwards_ . Do _2-3___ sessions per day.  ABDUCTION: Standing (Active)    Stand, feet flat. Lift right leg out to side 10 times, repeat with right leg 10 times Complete _2__ sets of _10__ repetitions. Perform _2-3__ sessions per day.  Single Leg - Eyes Open    Holding support, lift right leg while maintaining balance over other leg. Progress to removing hands from support surface for longer periods of time. Hold_10___ seconds.  Repeat with other leg Repeat _2___ times per session. Do _2-3___ sessions per day.  Copyright  VHI. All rights reserved.

## 2017-02-01 NOTE — Progress Notes (Signed)
Cardiology Office Note  Date:  02/01/2017   ID:  [name], DOB December 09, 1932, MRN [id number]  PCP:  [name], MD   Chief Complaint  Patient presents with  . Other    Patient had a stroke 01/09/2017 and was transferred to [facility].  Meds reviewed by the pt. verbally. Pt. c/o being lightheaded this am.     HPI:  Mr. Goshert is a very pleasant 83 year-old gentleman with a history of coronary artery disease, stent placed to his proximal RCA in September 2007, taxis stent 3.0 x 24 mm, with stress test 08/2010 and May 2012, patient of Dr. [name], previous syncopal episode (Etiology of his spell is uncertain), who presents for routine followup of his coronary artery disease,  Tremor, And after recent hospitalization for stroke  He reports that he was in good health when at the beginning of February 2018 he had acute vision changes, word finding difficulty. He was taken by life flight to [facility] hospital MRI/MRA showing acute stroke Details of scan as below: 1. Acute left parieto-occipital intraparenchymal hematoma with adjacent subarachnoid hemorrhage. This most likely represents hemorrhagic transformation from an embolic infarct given its configuration. No underlying mass. No other findings to suggest amyloid angiopathy. 2. Normal MRA of the head.  He had a CT scan of the neck showing 50% stenosis on the left, 10% on the right  At the time of his stroke he was on aspirin and Plavix. Plavix was held He had echocardiogram With details as below: NORMAL LEFT VENTRICULAR SYSTOLIC FUNCTION WITH MILD LVH NORMAL RIGHT VENTRICULAR SYSTOLIC FUNCTION VALVULAR REGURGITATION: TRIVIAL AR, TRIVIAL TR VALVULAR STENOSIS: TRIVIAL AS VERY POOR SOUND TRANSMISSION-DEFINITY CONTRAST USED NO PRIOR STUDY FOR COMPARISON   Prior to his stroke he was started on Sinemet for tremor Wife reports this did not help very much  He is currently at home, doing physical therapy Still with vision problems, some  word finding difficulty Mild gait issues/imbalance Slow steady improvement  EKG on today's visit shows sinus bradycardia with rate 52 bpm, unable to exclude old inferior MI  Other past medical history He had a nuclear Myoview earlier in 2014 that showed no ischemia. He has chronic dizziness when he changes position. This has been a chronic issue and he has learned to "live with it". No significant progression from previous visits.  stress test on Apr 20 2011 which showed no significant ischemia, normal ejection fraction estimated at 69%. There was a very small region of decreased perfusion suggestive of small region of old scar or attenuation artifact in the apical wall. Overall this was a low risk scan. He was able to perform on the treadmill well with a maximum heart rate of 128 beats per minute, exercised for 8-1/2 minutes, peak blood pressure 194/81.    PMH:   has a past medical history of BPH (benign prostatic hyperplasia); Coronary artery disease; and Hypertension.  PSH:    Past Surgical History:  Procedure Laterality Date  . Intestinal Blockage    . SKIN CANCER EXCISION     x2  . TONSILLECTOMY      Current Outpatient Prescriptions  Medication Sig Dispense Refill  . aspirin 81 MG tablet Take 81 mg by mouth daily.      Marland Kitchen b complex vitamins tablet Take 1 tablet by mouth daily.    . carbidopa-levodopa (PARCOPA) 25-100 MG disintegrating tablet Take 1 tablet by mouth 2 (two) times daily.    . cholecalciferol (VITAMIN D) 1000 units tablet Take  2,000 Units by mouth daily.    Marland Kitchen. FLAXSEED, LINSEED, PO Take 2 g by mouth daily.    . folic acid (FOLVITE) 400 MCG tablet Take 400 mcg by mouth daily.    . nitroGLYCERIN (NITROSTAT) 0.4 MG SL tablet Place 1 tablet (0.4 mg total) under the tongue every 5 (five) minutes as needed for chest pain. 25 tablet 3  . pregabalin (LYRICA) 150 MG capsule Take 150 mg by mouth daily.     . simvastatin (ZOCOR) 40 MG tablet Take 40 mg by mouth every  evening.     . Tamsulosin HCl (FLOMAX) 0.4 MG CAPS Take 0.8 mg by mouth daily.     Marland Kitchen. ezetimibe (ZETIA) 10 MG tablet Take 1 tablet (10 mg total) by mouth daily. 90 tablet 3   No current facility-administered medications for this visit.      Allergies:   Codeine and Penicillins   Social History:  The patient  reports that he has never smoked. He has never used smokeless tobacco. He reports that he drinks about 3.6 - 4.8 oz of alcohol per week . He reports that he does not use drugs.   Family History:   family history includes Cancer in his other; Hyperlipidemia in his other; Hypertension in his mother and other; Other in his other.    Review of Systems: Review of Systems  Constitutional: Negative.   Eyes:       Vision deficits  Respiratory: Negative.   Cardiovascular: Negative.   Gastrointestinal: Negative.   Musculoskeletal: Negative.        Gait instability  Neurological: Negative.        Word finding difficulty  Psychiatric/Behavioral: Negative.   All other systems reviewed and are negative.    PHYSICAL EXAM: VS:  BP 100/62 (BP Location: Right Arm, Patient Position: Sitting, Cuff Size: Normal)   Pulse (!) 53   Ht 5' 10.5" (1.791 m)   Wt 184 lb 4 oz (83.6 kg)   BMI 26.06 kg/m  , BMI Body mass index is 26.06 kg/m. GEN: Well nourished, well developed, in no acute distress  HEENT: normal  Neck: no JVD, carotid bruits, or masses Cardiac: RRR; no murmurs, rubs, or gallops,no edema  Respiratory:  clear to auscultation bilaterally, normal work of breathing GI: soft, nontender, nondistended, + BS MS: no deformity or atrophy  Skin: warm and dry, no rash Neuro:  Strength and sensation are intact Psych: euthymic mood, full affect    Recent Labs: 01/05/2017: ALT 26; BUN 20; Creatinine, Ser 1.11; Hemoglobin 15.5; Platelets 160; Potassium 4.1; Sodium 137    Lipid Panel Lab Results  Component Value Date   CHOL 122 01/23/2010   HDL 33.8 01/23/2010   LDLCALC 69.6 01/23/2010    TRIG 93 01/23/2010      Wt Readings from Last 3 Encounters:  02/01/17 184 lb 4 oz (83.6 kg)  01/05/17 185 lb (83.9 kg)  06/26/16 185 lb 8 oz (84.1 kg)       ASSESSMENT AND PLAN:  Hyperlipidemia, unspecified hyperlipidemia type - Plan: EKG 12-Lead Zetia added to his statin to achieve goal LDL less than 70 given coronary disease, carotid disease, recent stroke  HYPERTENSION, BENIGN - Plan: EKG 12-Lead Blood pressure running very low on today's visit, we will hold lisinopril  Atherosclerosis of native coronary artery of native heart without angina pectoris - Plan: EKG 12-Lead Currently with no symptoms of angina. No further workup at this time. We will add Zetia  Syncope, unspecified syncope type -  Plan: EKG 12-Lead No recent episodes of syncope or near syncope  Acute embolic stroke [facility]) Recommended we discussed his case with Dr. Graciela Husbands, EP MRI Maxine Glenn suggesting embolic stroke with hemorrhagic transformation We will discuss whether he is a candidate for reveal device to exclude paroxysmal atrial fibrillation.   Bradycardia Heart rate low 50s, currently asymptomatic Not on any rate controlling agents  Stenosis of right carotid artery 50% disease on the right  recommended aggressive cholesterol management  Essential tremor Currently taking Sinemet Wife does not think this is helping   Total encounter time more than 45 minutes  Greater than 50% was spent in counseling and coordination of care with the patient   Disposition:   F/U  6 months   Orders Placed This Encounter  Procedures  . EKG 12-Lead     Signed, [name] Arbour, M.D., Ph.D. 02/01/2017  [facility] [place], [place] [phone]

## 2017-02-01 NOTE — Therapy (Signed)
[facility] [address] [place], [place], [address] Phone: [phone]   Fax:  [phone]  Speech Language Pathology Treatment  Patient Details  Name: Calvin AmberGeorge Stanford Setters Jr. MRN: [id number] Date of Birth: 21-Apr-1933 Referring Provider: Lorretta Harpowns, Jairon, MD  Encounter Date: 02/01/2017      End of Session - 02/01/17 1645    Visit Number 2   Number of Visits 17   Date for SLP Re-Evaluation 03/29/17   SLP Start Time 1535   SLP Stop Time  1620   SLP Time Calculation (min) 45 min   Activity Tolerance Patient tolerated treatment well      Past Medical History:  Diagnosis Date  . BPH (benign prostatic hyperplasia)   . Coronary artery disease   . Hypertension     Past Surgical History:  Procedure Laterality Date  . Intestinal Blockage    . SKIN CANCER EXCISION     x2  . TONSILLECTOMY      There were no vitals filed for this visit.      Subjective Assessment - 02/01/17 1546    Subjective Pt indicates that reading out loud and reading comprehension are difficult for pt.   Currently in Pain? No/denies               ADULT SLP TREATMENT - 02/01/17 1546      General Information   Behavior/Cognition Alert;Cooperative;Pleasant mood     Treatment Provided   Treatment provided Cognitive-Linquistic     Cognitive-Linquistic Treatment   Treatment focused on Aphasia   Skilled Treatment SLP assessed pt's reading in session today. Pt required extra time with all tasks >8 words. Amount of time needed commensurate with the length of the selection. Pt's awareness of reading errors aloud was minimal. Goals added to STGs and LTGs.     Assessment / Recommendations / Plan   Plan Goals updated     Progression Toward Goals   Progression toward goals Progressing toward goals            SLP Short Term Goals - 02/01/17 1646      SLP SHORT TERM GOAL #1   Title pt will name average 10 items in concrete  category in 60 seconds, over two sessions   Time 4   Period Weeks   Status On-going     SLP SHORT TERM GOAL #2   Title pt will demo compensations for expressive language in functional 7 minute mod complex conversation    Time 4   Period Weeks   Status On-going     SLP SHORT TERM GOAL #3   Title pt will undergo assessment of reading in first 3 therapy sessions   Status Achieved     SLP SHORT TERM GOAL #4   Title pt will read sentences of 10-12 words and demo comprehension at 90% success   Time 4   Period Weeks   Status New          SLP Long Term Goals - 02/01/17 1647      SLP LONG TERM GOAL #1   Title pt will use compensations for functional 10 minute mod-max complex conversation   Time 8   Period Weeks   Status On-going     SLP LONG TERM GOAL #2   Title  pt will demo [facility] tasks requiring memory compensations (meds, appointments, etc)   Time 8   Period Weeks   Status On-going     SLP LONG TERM GOAL #3  Title pt will demo functional reading comprehension in 3-4 sentence paragraphs   Time 8   Period Weeks   Status New          Plan - 02/01/17 1645    Clinical Impression Statement Pt presents to ST today with anomic aphasia, with functional simple conversation (mild hesitations). Pt also has reading comprehension deficits, goals updated to add reading comprehension goals. SLP encouraged pt to use this to A him in word-finding. Pt with likely cognitive lingustic deficts as well, and pt/wife report reading comprehension deficits. Skilled ST is recommended to improve pt's language skills as well as cognitive linguistic skills.   Speech Therapy Frequency 2x / week   Duration --  8 weeks   Treatment/Interventions Language facilitation;Internal/external aids;SLP instruction and feedback;Multimodal communcation approach;Functional tasks;Cognitive reorganization;Oral motor exercises;Cueing hierarchy;Patient/family education;Compensatory techniques  some or all may be used    Potential to Achieve Goals Good   Consulted and Agree with Plan of Care Patient      Patient will benefit from skilled therapeutic intervention in order to improve the following deficits and impairments:   Aphasia  Cognitive communication deficit    Problem List Patient Active Problem List   Diagnosis Date Noted  . Stenosis of right carotid artery 02/01/2017  . Acute embolic stroke (HCC) 02/01/2017  . Bradycardia 11/10/2015  . Essential tremor 06/03/2015  . Dizziness 08/21/2012  . Syncope 04/10/2011  . CALF PAIN, LEFT 10/11/2010  . DYSPEPSIA 07/31/2010  . CHEST DISCOMFORT 07/31/2010  . Hyperlipidemia 05/16/2010  . HYPERTENSION, BENIGN 05/16/2010  . CAD, NATIVE VESSEL 05/16/2010    [facility] ,MS, CCC-SLP  02/01/2017, 4:49 PM  [facility] [address] [place], [place], [address] Phone: [phone]   Fax:  [phone]   Name: Calvin Denomme [facility]. MRN: [id number] Date of Birth: 10/07/1933

## 2017-02-01 NOTE — Patient Instructions (Signed)
  Please complete the assigned speech therapy homework prior to your next session and return it to the speech therapist at your next visit.  

## 2017-02-01 NOTE — Patient Instructions (Addendum)
I will talk with Dr. Graciela HusbandsKlein about a internal monitor (reveal device)   Medication Instructions:   Please start [name] once a day with simvastatin  Please hold the lisinopril , blood pressure is low  Ok to add a little salt  Labwork:  No new labs needed  Testing/Procedures:  No further testing at this time   I recommend watching educational videos on topics of interest to you at:       [email or web address]  Enter code: [email or web address]    Follow-Up: It was a pleasure seeing you in the office today. Please call us if you have new issues that need to be addressed before your next appt.  [phone]  Your physician wants you to follow-up in: 6 months.  You will receive a reminder letter in the mail two months in advance. If you don't receive a letter, please call our office to schedule the follow-up appointment.  If you need a refill on your cardiac medications before your next appointment, please call your pharmacy.

## 2017-02-01 NOTE — Telephone Encounter (Signed)
Patient's wife called and wants to know how long to stay off of the Lisinopril 10 mg. Please call [name].

## 2017-02-01 NOTE — Therapy (Signed)
[facility] [address] [place], [place], [address] Phone: [phone]   Fax:  [phone]  Physical Therapy Treatment  Patient Details  Name: [name]. MRN: [id number] Date of Birth: 26-Aug-1933 Referring Provider: Lorretta Harp, MD  Encounter Date: 02/01/2017      PT End of Session - 02/01/17 1639    Visit Number 2   Number of Visits 17   Date for PT Re-Evaluation 03/22/17   Authorization Type Medicare Part A and B-G code and progress note every 10th visit   PT Start Time 1450   PT Stop Time 1532   PT Time Calculation (min) 42 min   Activity Tolerance Patient tolerated treatment well   Behavior During Therapy [facility] for tasks assessed/performed      Past Medical History:  Diagnosis Date  . BPH (benign prostatic hyperplasia)   . Coronary artery disease   . Hypertension     Past Surgical History:  Procedure Laterality Date  . Intestinal Blockage    . SKIN CANCER EXCISION     x2  . TONSILLECTOMY      Vitals:   02/01/17 1459  BP: 123/62  Pulse: 61        Subjective Assessment - 02/01/17 1456    Subjective Just had appointment at cardiologist; reports BP being low.  Going to come off of one of his BP medications.  No other issues.     Patient is accompained by: Family member   Pertinent History Subcortical hemorrhage of left cerebral hemisphere (01/05/2017), parkinson's, [facility] Spotted Fever, Basal cell CA, squamous cell CA, DDD cervical, OA lumbar, HTN, BPH, vertigo, CAD, peripheral neuropathy   Limitations Standing;Walking;House hold activities   Patient Stated Goals Walk in community, workout at Club, treadmill, work in yard,            Development worker, international aid Exercises - 02/01/17 1509      Balance Exercises: Standing   SLS Eyes open;Upper extremity support 2;2 reps;10 secs   Stepping Strategy Posterior;Lateral;10 reps   Tandem Gait Forward;Retro;Upper extremity support;3 reps   Retro  Gait Foam/compliant surface;Other reps (comment)  forward/retro x 10 reps   Sidestepping Foam/compliant support;Other reps (comment)  10 reps to L and R   Turning Right;Left;10 reps;Other (comment)  on compliant surface   Other Standing Exercises squats with bilat UE support 2 sets x 10 reps; verbal cues for technique     OTAGO PROGRAM   Hip ABductor 10 reps  R and LLE x 2 sets, bilat UE support           PT Education - 02/01/17 1639    Education provided Yes   Education Details HEP   Person(s) Educated Patient;Spouse   Methods Explanation;Demonstration;Handout   Comprehension Verbalized understanding;Returned demonstration          PT Short Term Goals - 01/24/17 1709      PT SHORT TERM GOAL #1   Title Patient & wife demonstrate understanding of initial HEP. (Target Date: 02/22/2017)   Time 4   Period Weeks   Status New     PT SHORT TERM GOAL #2   Title Cognitive Timed Up-Go <12 seconds.  (Target Date: 02/22/2017)   Time 4   Period Weeks   Status New     PT SHORT TERM GOAL #3   Title Patient ambulates with head turns without balance loss & maintaining path.  (Target Date: 02/22/2017)   Time 4   Period Weeks   Status  New     PT SHORT TERM GOAL #4   Title Berg Balance >45/56 to indicate lower fall risk.   (Target Date: 02/22/2017)   Time 4   Period Weeks   Status New           PT Long Term Goals - 01/24/17 1731      PT LONG TERM GOAL #1   Title Patient demonstrates with wife's cueing ongoing exercise plan. (Target Date: 03/22/2017)   Time 8   Period Weeks   Status New     PT LONG TERM GOAL #2   Title Berg Balance >/= 50/56 to indicate lower fall risk. (Target Date: 03/22/2017)   Time 8   Period Weeks   Status New     PT LONG TERM GOAL #3   Title Cognitive Timed Up-Go increases <25% from standard TUG to indicate lower fall risk.  (Target Date: 03/22/2017)   Time 8   Period Weeks   Status New     PT LONG TERM GOAL #4   Title Functional Gait  Assessment >20/30 indicating lower fall risk.  (Target Date: 03/22/2017)   Time 8   Period Weeks   Status New     PT LONG TERM GOAL #5   Title MiniBESTest >14/28 indicating lower fall risk.  (Target Date: 03/22/2017)   Time 8   Period Weeks   Status New     Additional Long Term Goals   Additional Long Term Goals Yes     PT LONG TERM GOAL #6   Title Patient reports low back pain with standing & walking </= 3/10 (Target Date: 03/22/2017)   Time 8   Period Weeks   Status New               Plan - 02/01/17 1641    Clinical Impression Statement Treatment session today with focus on initiating and having pt return demonstrate standing balance and strengthening HEP, gait on compliant surfaces due to pt performing yard work at baseline and stepping strategy for balance recovery.   Clinical Impairments Affecting Rehab Potential Subcortical hemorrhage of left cerebral hemisphere (01/05/2017), parkinson's, [facility] Spotted Fever, Basal cell CA, squamous cell CA, DDD cervical, OA lumbar, HTN, BPH, vertigo, CAD, peripheral neuropathy   PT Treatment/Interventions ADLs/Self Care Home Management;Gait training;Stair training;Functional mobility training;Therapeutic activities;Therapeutic exercise;Balance training;Neuromuscular re-education;Patient/family education;Ultrasound;Moist Heat;Manual techniques   PT Next Visit Plan Work on walking speed, endurance on treadmill, gait outside and on compliant surfaces, PWR exercises   Consulted and Agree with Plan of Care Patient;Family member/caregiver   Family Member Consulted wife, [name]      Patient will benefit from skilled therapeutic intervention in order to improve the following deficits and impairments:  Abnormal gait, Decreased activity tolerance, Decreased balance, Decreased mobility, Decreased strength, Pain  Visit Diagnosis: Unsteadiness on feet  Chronic midline low back pain without sciatica  Muscle weakness (generalized)  Other  abnormalities of gait and mobility     Problem List Patient Active Problem List   Diagnosis Date Noted  . Stenosis of right carotid artery 02/01/2017  . Acute embolic stroke (HCC) 02/01/2017  . Bradycardia 11/10/2015  . Essential tremor 06/03/2015  . Dizziness 08/21/2012  . Syncope 04/10/2011  . CALF PAIN, LEFT 10/11/2010  . DYSPEPSIA 07/31/2010  . CHEST DISCOMFORT 07/31/2010  . Hyperlipidemia 05/16/2010  . HYPERTENSION, BENIGN 05/16/2010  . CAD, NATIVE VESSEL 05/16/2010   Edman CircleAudra Hall, PT, DPT 02/01/17    4:47 PM    [facility] [address] [place], [place], [address] Phone: [phone]   Fax:  [phone]  Name: Darl Kuss [facility] MRN: [id number] Date of Birth: 05-Apr-1933

## 2017-02-01 NOTE — Telephone Encounter (Signed)
Spoke w/ pt's wife.  Advised her that lisinopril was stopped due to low BP. He may resume if his BP becomes and stays elevated. She will continue to monitor and call back w/ any further questions or concerns.

## 2017-02-04 ENCOUNTER — Ambulatory Visit: Payer: Medicare Other | Admitting: Speech Pathology

## 2017-02-04 ENCOUNTER — Ambulatory Visit: Payer: Medicare Other | Admitting: Occupational Therapy

## 2017-02-04 ENCOUNTER — Other Ambulatory Visit: Payer: Self-pay

## 2017-02-04 ENCOUNTER — Encounter: Payer: Self-pay | Admitting: Physical Therapy

## 2017-02-04 ENCOUNTER — Ambulatory Visit: Payer: Medicare Other | Admitting: Physical Therapy

## 2017-02-04 DIAGNOSIS — R001 Bradycardia, unspecified: Secondary | ICD-10-CM

## 2017-02-04 DIAGNOSIS — R2681 Unsteadiness on feet: Secondary | ICD-10-CM | POA: Diagnosis not present

## 2017-02-04 DIAGNOSIS — R41841 Cognitive communication deficit: Secondary | ICD-10-CM

## 2017-02-04 DIAGNOSIS — M545 Low back pain, unspecified: Secondary | ICD-10-CM

## 2017-02-04 DIAGNOSIS — M6281 Muscle weakness (generalized): Secondary | ICD-10-CM

## 2017-02-04 DIAGNOSIS — R482 Apraxia: Secondary | ICD-10-CM

## 2017-02-04 DIAGNOSIS — R278 Other lack of coordination: Secondary | ICD-10-CM

## 2017-02-04 DIAGNOSIS — R251 Tremor, unspecified: Secondary | ICD-10-CM

## 2017-02-04 DIAGNOSIS — R41842 Visuospatial deficit: Secondary | ICD-10-CM

## 2017-02-04 DIAGNOSIS — G8929 Other chronic pain: Secondary | ICD-10-CM

## 2017-02-04 DIAGNOSIS — R4701 Aphasia: Secondary | ICD-10-CM

## 2017-02-04 DIAGNOSIS — R2689 Other abnormalities of gait and mobility: Secondary | ICD-10-CM

## 2017-02-04 NOTE — Patient Instructions (Addendum)
Activities to help with vision and focus/attention:  1.  Try completing simple (24 piece) jigsaw puzzles. 2.  Spread out cards and then search for matches. 3.  Try copying short paragraph, poem, phone number 4.  Try simple large print word search (or search for a letter--find all the "E's"  "T's", etc.)   Coordination Exercises  Perform the following exercises for 15 minutes 1 times per day. Perform with right hand(s).    Flipping Cards: Place deck of cards on the table. Flip cards over by opening your hand big to grasp and then turn your palm up big, opening hand fully to release.  Deal cards: Hold 1/2 or whole deck in your hand. Use thumb to push card off top of deck with one big push.  Rotate ball with fingertips: Pick up with fingers/thumb and move as much as you can with each turn/movement (clockwise and counter-clockwise).  Pick up coins and stack one at a time: Pick up with big, intentional movements. Do not drag coin to the edge. (5-10 in a stack)  Pick up 5-10 coins one at a time and hold in palm. Then, move coins from palm to fingertips one at time and place in coin bank/container.  Practice writing: Slow down, write big, and focus on forming each letter. 

## 2017-02-04 NOTE — Therapy (Signed)
[facility] [address] [place], [place], [address] Phone: [phone]   Fax:  [phone]  Speech Language Pathology Treatment  Patient Details  Name: [name]. MRN: [id number] Date of Birth: 1933/10/24 Referring Provider: Lorretta Harp, MD  Encounter Date: 02/04/2017      End of Session - 02/04/17 1605    Visit Number 3   Number of Visits 17   Date for SLP Re-Evaluation 03/29/17   SLP Start Time 1400   SLP Stop Time  1447   SLP Time Calculation (min) 47 min   Activity Tolerance Patient tolerated treatment well      Past Medical History:  Diagnosis Date  . BPH (benign prostatic hyperplasia)   . Coronary artery disease   . Hypertension     Past Surgical History:  Procedure Laterality Date  . Intestinal Blockage    . SKIN CANCER EXCISION     x2  . TONSILLECTOMY      There were no vitals filed for this visit.      Subjective Assessment - 02/04/17 1402    Subjective "I wanna be able to speak, to tell people what I want to say. It's my hardest subject by far, but I'm coming along."   Currently in Pain? No/denies               ADULT SLP TREATMENT - 02/04/17 1403      General Information   Behavior/Cognition Alert;Cooperative;Pleasant mood     Treatment Provided   Treatment provided Cognitive-Linquistic     Pain Assessment   Pain Assessment No/denies pain     Cognitive-Linquistic Treatment   Treatment focused on Aphasia   Skilled Treatment Discussed goals of care; patient states he "only started trying to read last week" and has been discouraged. Targeted reading comprehension of 10-12 word sentences. Patient selects appropriate word for meaning accuracy from a field of 2 with 80% accuracy and increased processing time. With verbal cues to re-read sentence aloud with his word choice, patient is able to correct mistakes. Patient states his wife frequently "fills in the blanks"  for him when he has difficulties with word-finding. Provided education regarding concept of neuroplasticity, communication strategies for self-cuing to improve word retrieval. Demonstrated these techniques during basic naming activity. Patient initially with 50% accuracy for less-common object naming; with verbal cues to use strategies of gesture, description, improves to 90% accuracy. Handouts provided for aphasia education, supportive conversation and communication strategies; SLP reviewed with patient's wife at the end of the session. Reading comprehension tasks assigned; instructed patient and his wife to begin using trained multimodal communication strategies for word retrieval.      Assessment / Recommendations / Plan   Plan Continue with current plan of care     Progression Toward Goals   Progression toward goals Progressing toward goals          SLP Education - 02/04/17 1604    Education provided Yes   Education Details multimodal communication, supportive conversation   Person(s) Educated Patient;Spouse   Methods Explanation;Demonstration;Verbal cues;Handout   Comprehension Verbalized understanding;Returned demonstration;Need further instruction          SLP Short Term Goals - 02/04/17 1606      SLP SHORT TERM GOAL #1   Title pt will name average 10 items in concrete category in 60 seconds, over two sessions   Time 4   Period Weeks   Status On-going     SLP SHORT TERM GOAL #  2   Title pt will demo compensations for expressive language in functional 7 minute mod complex conversation    Time 4   Period Weeks   Status On-going     SLP SHORT TERM GOAL #3   Title pt will undergo assessment of reading in first 3 therapy sessions   Status Achieved     SLP SHORT TERM GOAL #4   Title pt will read sentences of 10-12 words and demo comprehension at 90% success   Time 4   Period Weeks   Status On-going          SLP Long Term Goals - 02/04/17 1607      SLP LONG TERM  GOAL #1   Title pt will use compensations for functional 10 minute mod-max complex conversation   Time 8   Period Weeks   Status On-going     SLP LONG TERM GOAL #2   Title  pt will demo [facility] tasks requiring memory compensations (meds, appointments, etc)   Time 8   Period Weeks   Status On-going     SLP LONG TERM GOAL #3   Title pt will demo functional reading comprehension in 3-4 sentence paragraphs   Time 8   Period Weeks   Status On-going          Plan - 02/04/17 1605    Clinical Impression Statement Pt presents to ST today with anomic aphasia, with functional simple conversation (mild hesitations). Pt also has reading comprehension deficits. Patient is highly receptive of multimodal communication strategies and effectively used gesture, description strategies trained during the session to improve word retrieval. Pt with likely cognitive lingustic deficits as well, and pt/wife report reading comprehension deficits. Skilled ST is recommended to improve pt's language skills as well as cognitive linguistic skills.   Speech Therapy Frequency 2x / week   Duration Other (comment)   Treatment/Interventions Language facilitation;Internal/external aids;SLP instruction and feedback;Multimodal communcation approach;Functional tasks;Cognitive reorganization;Oral motor exercises;Cueing hierarchy;Patient/family education;Compensatory techniques   Potential to Achieve Goals Good   Consulted and Agree with Plan of Care Patient      Patient will benefit from skilled therapeutic intervention in order to improve the following deficits and impairments:   Aphasia  Cognitive communication deficit    Problem List Patient Active Problem List   Diagnosis Date Noted  . Stenosis of right carotid artery 02/01/2017  . Acute embolic stroke (HCC) 02/01/2017  . Bradycardia 11/10/2015  . Essential tremor 06/03/2015  . Dizziness 08/21/2012  . Syncope 04/10/2011  . CALF PAIN, LEFT 10/11/2010  .  DYSPEPSIA 07/31/2010  . CHEST DISCOMFORT 07/31/2010  . Hyperlipidemia 05/16/2010  . HYPERTENSION, BENIGN 05/16/2010  . CAD, NATIVE VESSEL 05/16/2010   Rondel BatonMary Beth Sheena Donegan, MS CF-SLP Speech-Language Pathologist  [name] 02/04/2017, 4:08 PM  [facility] [address] [place], [place], [address] Phone: [phone]   Fax:  [phone]   Name: Calvin ShortsGeorge Stanford [facility] MRN: [id number] Date of Birth: [birth year]

## 2017-02-04 NOTE — Therapy (Signed)
[place] [address] [place], [place], [address] Phone: [phone]   Fax:  [phone]  Occupational Therapy Treatment  Patient Details  Name: [name]. MRN: [id number] Date of Birth: 07-28-1933 Referring Provider: Dr. Rea College  Encounter Date: 02/04/2017      OT End of Session - 02/04/17 1204    Visit Number 2   Number of Visits 16   Date for OT Re-Evaluation 03/21/17   Authorization Type medicare will need G code and PN every 10th visit   Authorization Time Period 60 days   Authorization - Visit Number 2   Authorization - Number of Visits 10   OT Start Time 1149   OT Stop Time 1230   OT Time Calculation (min) 41 min   Activity Tolerance Patient tolerated treatment well      Past Medical History:  Diagnosis Date  . BPH (benign prostatic hyperplasia)   . Coronary artery disease   . Hypertension     Past Surgical History:  Procedure Laterality Date  . Intestinal Blockage    . SKIN CANCER EXCISION     x2  . TONSILLECTOMY      There were no vitals filed for this visit.      Subjective Assessment - 02/04/17 1151    Subjective  Pt/wife report improvement in reading and writing.  pt/wife also report pt has been doing bathing mod I including transfer   Patient is accompained by: Family member  wife [name]   Pertinent History see epic.  L CVA, R hemiopsia, Parkinson's, essential tremor   Patient Stated Goals I want to get well and be perfect again.  I hate these questions (pt unable to give specific answer)   Currently in Pain? No/denies   Pain Onset Yesterday        Copying phone numbers from left side of page to R side of the page with 8/12 accurate (3 with one # incorrect, and 1 with multiple #'s incorrect).  Tremor seen with writing but all numbers legible.  Pt needed min v.c. To correct errors (appear to be aphasia/language based).  1.19M Visual scanning/number cancellation  sheet with 100% accuracy with mod incr time in busy environment, 1 v.c. For attention (internal distraction).  STG #3 met.  Pt has performed shower transfer mod I as well (will monitor consistency).  Writing name with approx 80% legibility, min decr in size at end of words.                    OT Education - 02/04/17 1242    Education provided Yes   Education Details R hand coordination and initial attention/vision HEP   Person(s) Educated Patient;Spouse   Methods Explanation;Demonstration;Handout;Verbal cues   Comprehension Verbalized understanding;Returned demonstration          OT Short Term Goals - 02/04/17 1245      OT SHORT TERM GOAL #1   Title Pt and wife will be mod I with HEP - 02/21/2017   Status New     OT SHORT TERM GOAL #2   Title Pt will be mod I with shower transfers   Status New     OT SHORT TERM GOAL #3   Title Pt will be mod I with bathing   Status Achieved     OT SHORT TERM GOAL #4   Title Pt will complete table top scanning activity (not lanaguage or letter based) with 75% accuracy   Status  New     OT SHORT TERM GOAL #5   Title Pt will attend to familiar task in busy environment with no more than min vc's.     Status New           OT Long Term Goals - 01/24/17 1303      OT LONG TERM GOAL #1   Title Pt and wife will be mod I with ugraded HEP prn - 03/21/2017   Status New     OT LONG TERM GOAL #2   Title Pt will demonstrate improved coordinaton by decreasing time on 9 hole peg test by 4 seconds (baseline=34.15)   Status New     OT LONG TERM GOAL #3   Title Pt will be able to print/write name legibly AE prn   Status New     OT LONG TERM GOAL #4   Title Pt will demonstate ability to maintain balance on outdoor uneven surfaces in preparation for returning to simple yard work.    Status New     OT LONG TERM GOAL #5   Title Pt will require no more than 2 vc's to recall information from therapy session   Status New     OT LONG  TERM GOAL #6   Title Pt will complete table top scanning activity (not language based) with 90% accuracy   Status New     OT LONG TERM GOAL #8   Title Pt will complete environmental scanning task with at least 75% accuracy   Status New               Plan - 02/04/17 1205    Clinical Impression Statement Pt demo improvements from OT eval and is approximating STGs.  Pt is progressing well.   Rehab Potential Good   OT Frequency 2x / week   OT Duration 8 weeks   OT Treatment/Interventions Self-care/ADL training;Electrical Stimulation;Therapeutic exercise;Neuromuscular education;DME and/or AE instruction;Energy conservation;Therapist, nutritional;Therapeutic activities;Cognitive remediation/compensation;Visual/perceptual remediation/compensation;Patient/family education;Balance training   Plan environmental scanning, simple jigsaw puzzle, writing (may need to upgrade goals)   Consulted and Agree with Plan of Care Patient;Family member/caregiver   Family Member Consulted wife [name]      Patient will benefit from skilled therapeutic intervention in order to improve the following deficits and impairments:  Decreased balance, Decreased cognition, Decreased coordination, Decreased safety awareness, Decreased mobility, Decreased knowledge of use of DME, Impaired UE functional use, Impaired vision/preception  Visit Diagnosis: Other lack of coordination  Visuospatial deficit  Unsteadiness on feet  Apraxia  Tremor    Problem List Patient Active Problem List   Diagnosis Date Noted  . Stenosis of right carotid artery 02/01/2017  . Acute embolic stroke ([place]) 60/63/0160  . Bradycardia 11/10/2015  . Essential tremor 06/03/2015  . Dizziness 08/21/2012  . Syncope 04/10/2011  . CALF PAIN, LEFT 10/11/2010  . DYSPEPSIA 07/31/2010  . CHEST DISCOMFORT 07/31/2010  . Hyperlipidemia 05/16/2010  . HYPERTENSION, BENIGN 05/16/2010  . CAD, NATIVE VESSEL 05/16/2010     [facility] 02/04/2017, 12:52 PM  [place] [address] [place], [place], [address] Phone: [phone]   Fax:  [phone]  Name: [name] [facility] MRN: [id number] Date of Birth: 01/10/1933   Vianne Bulls, OTR/L [facility] [address] [place], [place]  [address] [phone] phone [phone] 02/04/17 12:52 PM

## 2017-02-04 NOTE — Therapy (Signed)
[facility] [address] [place], [place], [address] Phone: [phone]   Fax:  [phone]  Physical Therapy Treatment  Patient Details  Name: [name]. MRN: [id number] Date of Birth: [birth year] Referring Provider: Lorretta Harp, MD  Encounter Date: 02/04/2017      PT End of Session - 02/04/17 2029    Visit Number 3   Number of Visits 17   Date for PT Re-Evaluation 03/22/17   Authorization Type Medicare Part A and B-G code and progress note every 10th visit   PT Start Time 1315   PT Stop Time 1400   PT Time Calculation (min) 45 min   Equipment Utilized During Treatment Gait belt   Activity Tolerance Patient tolerated treatment well   Behavior During Therapy WFL for tasks assessed/performed      Past Medical History:  Diagnosis Date  . BPH (benign prostatic hyperplasia)   . Coronary artery disease   . Hypertension     Past Surgical History:  Procedure Laterality Date  . Intestinal Blockage    . SKIN CANCER EXCISION     x2  . TONSILLECTOMY      There were no vitals filed for this visit.      Subjective Assessment - 02/04/17 1319    Subjective No falls. He is still limited by back pain.    Pertinent History Subcortical hemorrhage of left cerebral hemisphere (01/05/2017), parkinson's, [facility] Spotted Fever, Basal cell CA, squamous cell CA, DDD cervical, OA lumbar, HTN, BPH, vertigo, CAD, peripheral neuropathy   Limitations Standing;Walking;House hold activities   Patient Stated Goals Walk in community, workout at Club, treadmill, work in yard,    Currently in Pain? No/denies                         [facility] Adult PT Treatment/Exercise - 02/04/17 1315      Ambulation/Gait   Ambulation/Gait Yes   Ambulation/Gait Assistance 5: Supervision   Ambulation/Gait Assistance Details Increasing pace & head turns maintaining path & pace   Ambulation Distance (Feet) 500 Feet   Assistive device None   Ambulation Surface Indoor;Level   Stairs Yes   Stairs Assistance 5: Supervision   Stairs Assistance Details (indicate cue type and reason) verbal cues on weight shfit   Stair Management Technique One rail Left;Alternating pattern;Forwards   Number of Stairs 4   Ramp 4: Min assist  no device   Ramp Details (indicate cue type and reason) verbal cues on posture & wt shift   Curb 4: Min assist  no device   Curb Details (indicate cue type and reason) verbal cues on step thru for momentum & balance   Gait Comments Treadmill with UE support (cues for light support) 2.46mph for 10 min.      High Level Balance   High Level Balance Activities Side stepping;Braiding;Backward walking;Head turns;Tandem walking;Marching forwards;Marching backwards   High Level Balance Comments verbal & tactile cues on balance reactions             Balance Exercises - 02/04/17 1315      Balance Exercises: Standing   Stepping Strategy Anterior;Posterior;Foam/compliant surface   Rockerboard Anterior/posterior;Lateral;Head turns;Intermittent UE support   Balance Beam firm foam beam crossways: head turns EO   Gait with Head Turns Forward  sideways, up/down & diagonals with minA   Tandem Gait Forward  minA / tactile cues   Retro Gait Head turns   Sidestepping 5 reps;Other (comment)  crossovers & braiding     low back pain with standing exercises with PT instructing in standing stretches for hip flexion, hamstrings & back extension.         PT Short Term Goals - 02/04/17 2029      PT SHORT TERM GOAL #1   Title Patient & wife demonstrate understanding of initial HEP. (Target Date: 02/22/2017)   Time 4   Period Weeks   Status On-going     PT SHORT TERM GOAL #2   Title Cognitive Timed Up-Go <12 seconds.  (Target Date: 02/22/2017)   Time 4   Period Weeks   Status On-going     PT SHORT TERM GOAL #3   Title Patient ambulates with head turns without balance loss & maintaining  path.  (Target Date: 02/22/2017)   Time 4   Period Weeks   Status On-going     PT SHORT TERM GOAL #4   Title Berg Balance >45/56 to indicate lower fall risk.   (Target Date: 02/22/2017)   Time 4   Period Weeks   Status On-going           PT Long Term Goals - 02/04/17 2030      PT LONG TERM GOAL #1   Title Patient demonstrates with wife's cueing ongoing exercise plan. (Target Date: 03/22/2017)   Time 8   Period Weeks   Status On-going     PT LONG TERM GOAL #2   Title Berg Balance >/= 50/56 to indicate lower fall risk. (Target Date: 03/22/2017)   Time 8   Period Weeks   Status On-going     PT LONG TERM GOAL #3   Title Cognitive Timed Up-Go increases <25% from standard TUG to indicate lower fall risk.  (Target Date: 03/22/2017)   Time 8   Period Weeks   Status On-going     PT LONG TERM GOAL #4   Title Functional Gait Assessment >20/30 indicating lower fall risk.  (Target Date: 03/22/2017)   Time 8   Period Weeks   Status On-going     PT LONG TERM GOAL #5   Title MiniBESTest >14/28 indicating lower fall risk.  (Target Date: 03/22/2017)   Time 8   Period Weeks   Status On-going     PT LONG TERM GOAL #6   Title Patient reports low back pain with standing & walking </= 3/10 (Target Date: 03/22/2017)   Time 8   Period Weeks   Status On-going               Plan - 02/04/17 2030    Clinical Impression Statement Patient improved balance reactions with skilled instruction including tactile cues.    Clinical Impairments Affecting Rehab Potential Subcortical hemorrhage of left cerebral hemisphere (01/05/2017), parkinson's, [facility] Spotted Fever, Basal cell CA, squamous cell CA, DDD cervical, OA lumbar, HTN, BPH, vertigo, CAD, peripheral neuropathy   PT Treatment/Interventions ADLs/Self Care Home Management;Gait training;Stair training;Functional mobility training;Therapeutic activities;Therapeutic exercise;Balance training;Neuromuscular re-education;Patient/family  education;Ultrasound;Moist Heat;Manual techniques   PT Next Visit Plan Work on walking speed, endurance on treadmill, gait outside and on compliant surfaces, PWR exercises   Consulted and Agree with Plan of Care Patient;Family member/caregiver   Family Member Consulted wife, [name]      Patient will benefit from skilled therapeutic intervention in order to improve the following deficits and impairments:  Abnormal gait, Decreased activity tolerance, Decreased balance, Decreased mobility, Decreased strength, Pain  Visit Diagnosis: Unsteadiness on feet  Chronic midline low back pain  without sciatica  Muscle weakness (generalized)  Other abnormalities of gait and mobility     Problem List Patient Active Problem List   Diagnosis Date Noted  . Stenosis of right carotid artery 02/01/2017  . Acute embolic stroke (HCC) 02/01/2017  . Bradycardia 11/10/2015  . Essential tremor 06/03/2015  . Dizziness 08/21/2012  . Syncope 04/10/2011  . CALF PAIN, LEFT 10/11/2010  . DYSPEPSIA 07/31/2010  . CHEST DISCOMFORT 07/31/2010  . Hyperlipidemia 05/16/2010  . HYPERTENSION, BENIGN 05/16/2010  . CAD, NATIVE VESSEL 05/16/2010    Dustine Bertini PT, DPT 02/04/2017, 8:34 PM  [place] [facility] [address] [place], [place], [address] Phone: [phone]   Fax:  [phone]  Name: [name] [facility]. MRN: [id number] Date of Birth: [birth year]

## 2017-02-05 ENCOUNTER — Ambulatory Visit: Payer: Medicare Other | Admitting: Occupational Therapy

## 2017-02-05 DIAGNOSIS — R41842 Visuospatial deficit: Secondary | ICD-10-CM

## 2017-02-05 DIAGNOSIS — R2681 Unsteadiness on feet: Secondary | ICD-10-CM | POA: Diagnosis not present

## 2017-02-05 DIAGNOSIS — R278 Other lack of coordination: Secondary | ICD-10-CM

## 2017-02-05 NOTE — Therapy (Signed)
[place] [address] [place], [place], [address] Phone: [phone]   Fax:  [phone]  Occupational Therapy Treatment  Patient Details  Name: [name]. MRN: [id number] Date of Birth: 1933-01-26 Referring Provider: Dr. Rea College  Encounter Date: 02/05/2017      OT End of Session - 02/05/17 1259    Visit Number 3   Date for OT Re-Evaluation 03/21/17   Authorization Type medicare will need G code and PN every 10th visit   Authorization Time Period 60 days   Authorization - Visit Number 3   Authorization - Number of Visits 10   OT Start Time [phone]   OT Stop Time 0933   OT Time Calculation (min) 40 min   Activity Tolerance Patient tolerated treatment well   Behavior During Therapy [facility] for tasks assessed/performed      Past Medical History:  Diagnosis Date  . BPH (benign prostatic hyperplasia)   . Coronary artery disease   . Hypertension     Past Surgical History:  Procedure Laterality Date  . Intestinal Blockage    . SKIN CANCER EXCISION     x2  . TONSILLECTOMY      There were no vitals filed for this visit.      Subjective Assessment - 02/05/17 1253    Subjective  Pt denies pain   Pertinent History see epic.  L CVA, R hemiopsia, Parkinson's, essential tremor   Patient Stated Goals I want to get well and be perfect again.  I hate these questions (pt unable to give specific answer)   Currently in Pain? No/denies         Treatment: Pt was educated in handwriting strategies(elbow supported, slow down and use of foam grip) Pt demonstrates improved handwriting and legibility. Completing a 24 piece puzzle,increased time required with  mod difficulty and mod v.c. For organization due to visual perceptual deficits. Reading articles for a magazine using line guide, min-mod v.c and assistance. Pt reports line guide helps.                        OT Short Term Goals -  02/04/17 1245      OT SHORT TERM GOAL #1   Title Pt and wife will be mod I with HEP - 02/21/2017   Status New     OT SHORT TERM GOAL #2   Title Pt will be mod I with shower transfers   Status New     OT SHORT TERM GOAL #3   Title Pt will be mod I with bathing   Status Achieved     OT SHORT TERM GOAL #4   Title Pt will complete table top scanning activity (not lanaguage or letter based) with 75% accuracy   Status New     OT SHORT TERM GOAL #5   Title Pt will attend to familiar task in busy environment with no more than min vc's.     Status New           OT Long Term Goals - 02/05/17 1258      OT LONG TERM GOAL #1   Title Pt and wife will be mod I with ugraded HEP prn - 03/21/2017   Status New     OT LONG TERM GOAL #2   Title Pt will demonstrate improved coordinaton by decreasing time on 9 hole peg test by 4 seconds (baseline=34.15)   Status New  OT LONG TERM GOAL #3   Title Pt will be able to print/write name legibly AE prn- (revised goal) Pt will demonstrate ability to write a 4 sentence paragraph with 100% legibility, and no significant decr. in letter size using AE PRN.   Status Revised  inital goal met,      OT LONG TERM GOAL #4   Title Pt will demonstate ability to maintain balance on outdoor uneven surfaces in preparation for returning to simple yard work.    Status New     OT LONG TERM GOAL #5   Title Pt will require no more than 2 vc's to recall information from therapy session   Status New     OT LONG TERM GOAL #6   Title Pt will complete table top scanning activity (not language based) with 90% accuracy   Status New     OT LONG TERM GOAL #8   Title Pt will complete environmental scanning task with at least 75% accuracy   Status New               Plan - 02/05/17 1255    Clinical Impression Statement Pt is progressing towards goals. He demonstrates improved handwriting with foam grip, elbow supported and slowing down for task.   Rehab  Potential Good   OT Frequency 2x / week   OT Duration 8 weeks   OT Treatment/Interventions Self-care/ADL training;Electrical Stimulation;Therapeutic exercise;Neuromuscular education;DME and/or AE instruction;Energy conservation;Therapist, nutritional;Therapeutic activities;Cognitive remediation/compensation;Visual/perceptual remediation/compensation;Patient/family education;Balance training   Plan environmental scanning, work towards goals.   Consulted and Agree with Plan of Care Patient      Patient will benefit from skilled therapeutic intervention in order to improve the following deficits and impairments:  Decreased balance, Decreased cognition, Decreased coordination, Decreased safety awareness, Decreased mobility, Decreased knowledge of use of DME, Impaired UE functional use, Impaired vision/preception  Visit Diagnosis: Other lack of coordination  Visuospatial deficit    Problem List Patient Active Problem List   Diagnosis Date Noted  . Stenosis of right carotid artery 02/01/2017  . Acute embolic stroke ([place]) 34/96/1164  . Bradycardia 11/10/2015  . Essential tremor 06/03/2015  . Dizziness 08/21/2012  . Syncope 04/10/2011  . CALF PAIN, LEFT 10/11/2010  . DYSPEPSIA 07/31/2010  . CHEST DISCOMFORT 07/31/2010  . Hyperlipidemia 05/16/2010  . HYPERTENSION, BENIGN 05/16/2010  . CAD, NATIVE VESSEL 05/16/2010    Kaycie Pegues 02/05/2017, 1:00 PM  [place] [address] [place], [place], [address] Phone: [phone]   Fax:  [phone]  Name: [name] [facility]. MRN: [id number] Date of Birth: 18-Nov-1933

## 2017-02-06 ENCOUNTER — Encounter: Payer: Self-pay | Admitting: Physical Therapy

## 2017-02-06 ENCOUNTER — Ambulatory Visit: Payer: Medicare Other | Admitting: Physical Therapy

## 2017-02-06 ENCOUNTER — Ambulatory Visit: Payer: Medicare Other

## 2017-02-06 DIAGNOSIS — R2689 Other abnormalities of gait and mobility: Secondary | ICD-10-CM

## 2017-02-06 DIAGNOSIS — M545 Low back pain, unspecified: Secondary | ICD-10-CM

## 2017-02-06 DIAGNOSIS — R41841 Cognitive communication deficit: Secondary | ICD-10-CM

## 2017-02-06 DIAGNOSIS — G8929 Other chronic pain: Secondary | ICD-10-CM

## 2017-02-06 DIAGNOSIS — R2681 Unsteadiness on feet: Secondary | ICD-10-CM

## 2017-02-06 DIAGNOSIS — M6281 Muscle weakness (generalized): Secondary | ICD-10-CM

## 2017-02-06 DIAGNOSIS — R4701 Aphasia: Secondary | ICD-10-CM

## 2017-02-06 NOTE — Patient Instructions (Signed)
  Please complete the assigned speech therapy homework prior to your next session and return it to the speech therapist at your next visit.  

## 2017-02-06 NOTE — Therapy (Signed)
[facility] [address] [place], [place], [address] Phone: [phone]   Fax:  [phone]  Speech Language Pathology Treatment  Patient Details  Name: [name]. MRN: [id number] Date of Birth: [birth year] Referring Provider: Lorretta Harp, MD  Encounter Date: 02/06/2017      End of Session - 02/06/17 1655    Visit Number 4   Number of Visits 17   SLP Start Time 1449   SLP Stop Time  1531   SLP Time Calculation (min) 42 min      Past Medical History:  Diagnosis Date  . BPH (benign prostatic hyperplasia)   . Coronary artery disease   . Hypertension     Past Surgical History:  Procedure Laterality Date  . Intestinal Blockage    . SKIN CANCER EXCISION     x2  . TONSILLECTOMY      There were no vitals filed for this visit.      Subjective Assessment - 02/06/17 1452    Subjective "Well, maybe it's going a little better but it's hard to tell when you live with yourself every day."   Pain Onset More than a month ago               ADULT SLP TREATMENT - 02/06/17 1453      General Information   Behavior/Cognition Alert;Cooperative;Pleasant mood     Treatment Provided   Treatment provided Cognitive-Linquistic     Cognitive-Linquistic Treatment   Treatment focused on Aphasia   Skilled Treatment Pt reports to SLP that he would like to work on both reading and word finding today. In reading tasks (sentence and multi-sentence) semantic errors noted mostly, and approx 70% of the time with prepositions and other "filler" words ("that", "of", "for", etc). SLP encouraged pt to follow along with his finger, as he was more accrate with reading aloud correctly. With word finding, SLP engaged pt in task of category matrixes and pt req'd mod-max A usually for generation of specific members with specific initial letters.      Assessment / Recommendations / Plan   Plan Continue with current plan of  care     Progression Toward Goals   Progression toward goals Progressing toward goals            SLP Short Term Goals - 02/06/17 1656      SLP SHORT TERM GOAL #1   Title pt will name average 10 items in concrete category in 60 seconds, over two sessions   Time 3   Period Weeks   Status On-going     SLP SHORT TERM GOAL #2   Title pt will demo compensations for expressive language in functional 7 minute mod complex conversation    Time 3   Period Weeks   Status On-going     SLP SHORT TERM GOAL #3   Title pt will undergo assessment of reading in first 3 therapy sessions   Status Achieved     SLP SHORT TERM GOAL #4   Title pt will read sentences of 10-12 words and demo comprehension at 90% success   Time 3   Period Weeks   Status On-going          SLP Long Term Goals - 02/06/17 1657      SLP LONG TERM GOAL #1   Title pt will use compensations for functional 10 minute mod-max complex conversation   Time 7   Period Weeks   Status On-going  SLP LONG TERM GOAL #2   Title  pt will demo [facility] tasks requiring memory compensations (meds, appointments, etc)   Time 7   Period Weeks   Status On-going     SLP LONG TERM GOAL #3   Title pt will demo functional reading comprehension in 3-4 sentence paragraphs   Time 7   Period Weeks   Status On-going          Plan - 02/06/17 1655    Clinical Impression Statement Pt presents to ST today with anomic aphasia, with functional simple conversation (mild hesitations). Pt also demo's reading comprehension deficits. Patient req'd cues to use multimodal communication strategies with expressive tasks. Skilled ST is recommended to improve pt's language skills as well as cognitive linguistic skills.   Speech Therapy Frequency 2x / week   Duration --  8 weeks   Treatment/Interventions Language facilitation;Internal/external aids;SLP instruction and feedback;Multimodal communcation approach;Functional tasks;Cognitive  reorganization;Oral motor exercises;Cueing hierarchy;Patient/family education;Compensatory techniques   Potential to Achieve Goals Good   Potential Considerations Severity of impairments   Consulted and Agree with Plan of Care Patient      Patient will benefit from skilled therapeutic intervention in order to improve the following deficits and impairments:   Aphasia  Cognitive communication deficit    Problem List Patient Active Problem List   Diagnosis Date Noted  . Stenosis of right carotid artery 02/01/2017  . Acute embolic stroke (HCC) 02/01/2017  . Bradycardia 11/10/2015  . Essential tremor 06/03/2015  . Dizziness 08/21/2012  . Syncope 04/10/2011  . CALF PAIN, LEFT 10/11/2010  . DYSPEPSIA 07/31/2010  . CHEST DISCOMFORT 07/31/2010  . Hyperlipidemia 05/16/2010  . HYPERTENSION, BENIGN 05/16/2010  . CAD, NATIVE VESSEL 05/16/2010    [facility] Center[facility]INKE,[name] ,MS, CCC-SLP  02/06/2017, 4:57 PM  [place] [facility] [address] [place], [place], [address] Phone: [phone]   Fax:  [phone]   Name: Calvin ShortsGeorge [facility] MRN: [id number] Date of Birth: 1933-05-20

## 2017-02-07 NOTE — Therapy (Signed)
[facility] [address] [place], [place], [address] Phone: [phone]   Fax:  [phone]  Physical Therapy Treatment  Patient Details  Name: [name]. MRN: [id number] Date of Birth: 1932/12/10 Referring Provider: Lorretta Harp, MD  Encounter Date: 02/06/2017      PT End of Session - 02/06/17 1407    Visit Number 4   Number of Visits 17   Date for PT Re-Evaluation 03/22/17   Authorization Type Medicare Part A and B-G code and progress note every 10th visit   PT Start Time 1403   PT Stop Time 1445   PT Time Calculation (min) 42 min   Equipment Utilized During Treatment Gait belt   Activity Tolerance Patient tolerated treatment well   Behavior During Therapy WFL for tasks assessed/performed      Past Medical History:  Diagnosis Date  . BPH (benign prostatic hyperplasia)   . Coronary artery disease   . Hypertension     Past Surgical History:  Procedure Laterality Date  . Intestinal Blockage    . SKIN CANCER EXCISION     x2  . TONSILLECTOMY      There were no vitals filed for this visit.      Subjective Assessment - 02/06/17 1406    Subjective No new complaints. No falls to report. Continues to have low back pain   Patient is accompained by: Family member   Pertinent History Subcortical hemorrhage of left cerebral hemisphere (01/05/2017), parkinson's, [facility] Spotted Fever, Basal cell CA, squamous cell CA, DDD cervical, OA lumbar, HTN, BPH, vertigo, CAD, peripheral neuropathy   Limitations Standing;Walking;House hold activities   Patient Stated Goals Walk in community, workout at Club, treadmill, work in yard,    Currently in Pain? Yes   Pain Score 1    Pain Location Back   Pain Orientation Lower   Pain Descriptors / Indicators Aching;Sore   Pain Type Chronic pain   Pain Onset More than a month ago   Pain Frequency Intermittent   Aggravating Factors  increased activity   Pain  Relieving Factors stretching, resting.           [facility] Adult PT Treatment/Exercise - 02/06/17 1408      Transfers   Transfers Sit to Stand;Stand to Sit   Sit to Stand 6: Modified independent (Device/Increase time);With upper extremity assist;With armrests;From chair/3-in-1   Stand to Sit 6: Modified independent (Device/Increase time);With upper extremity assist;To chair/3-in-1     Ambulation/Gait   Ambulation/Gait Yes   Ambulation/Gait Assistance 4: Min guard;5: Supervision   Ambulation Distance (Feet) 500 Feet   Assistive device None   Gait Pattern Decreased arm swing - right;Decreased stride length;Decreased stance time - left   Ambulation Surface Level;Unlevel;Indoor;Paved;Outdoor     High Level Balance   High Level Balance Activities Marching forwards;Marching backwards;Tandem walking  tandem/heel/toe walking fwd/bwd   High Level Balance Comments on red mats next to counter top: intermittent UE support on counter top     Exercises   Other Exercises  sit<>stands: x 10 with OH press using 2# weighted ball, min guard assist with cues on ex technique/form; with feet across blue foam beam x 10 reps with UE assist and up to min assist for balance.                             Balance Exercises - 02/06/17 1429      Balance Exercises:  Standing   SLS with Vectors Foam/compliant surface;Limitations     Balance Exercises: Standing   SLS with Vectors Limitations 6 cones along edges of red mats: atlernating toe taps to each with side stepping left<>right x 1 lap each way, altenrating double toe taps to each with side stepping left<>right. min gaurd to min assist with cues on posture, weight shifting             PT Short Term Goals - 02/04/17 2029      PT SHORT TERM GOAL #1   Title Patient & wife demonstrate understanding of initial HEP. (Target Date: 02/22/2017)   Time 4   Period Weeks   Status On-going     PT SHORT TERM GOAL #2   Title Cognitive Timed Up-Go <12  seconds.  (Target Date: 02/22/2017)   Time 4   Period Weeks   Status On-going     PT SHORT TERM GOAL #3   Title Patient ambulates with head turns without balance loss & maintaining path.  (Target Date: 02/22/2017)   Time 4   Period Weeks   Status On-going     PT SHORT TERM GOAL #4   Title Berg Balance >45/56 to indicate lower fall risk.   (Target Date: 02/22/2017)   Time 4   Period Weeks   Status On-going           PT Long Term Goals - 02/04/17 2030      PT LONG TERM GOAL #1   Title Patient demonstrates with wife's cueing ongoing exercise plan. (Target Date: 03/22/2017)   Time 8   Period Weeks   Status On-going     PT LONG TERM GOAL #2   Title Berg Balance >/= 50/56 to indicate lower fall risk. (Target Date: 03/22/2017)   Time 8   Period Weeks   Status On-going     PT LONG TERM GOAL #3   Title Cognitive Timed Up-Go increases <25% from standard TUG to indicate lower fall risk.  (Target Date: 03/22/2017)   Time 8   Period Weeks   Status On-going     PT LONG TERM GOAL #4   Title Functional Gait Assessment >20/30 indicating lower fall risk.  (Target Date: 03/22/2017)   Time 8   Period Weeks   Status On-going     PT LONG TERM GOAL #5   Title MiniBESTest >14/28 indicating lower fall risk.  (Target Date: 03/22/2017)   Time 8   Period Weeks   Status On-going     PT LONG TERM GOAL #6   Title Patient reports low back pain with standing & walking </= 3/10 (Target Date: 03/22/2017)   Time 8   Period Weeks   Status On-going               Plan - 02/06/17 1407    Clinical Impression Statement Todays skilled session continued to address strengthening, gait and balance wihout any isues reported. Pt is making steaydy progress toward goals and should benefit from continued PT to progress toward unmet goals.   Clinical Impairments Affecting Rehab Potential Subcortical hemorrhage of left cerebral hemisphere (01/05/2017), parkinson's, [facility] Spotted Fever, Basal cell CA,  squamous cell CA, DDD cervical, OA lumbar, HTN, BPH, vertigo, CAD, peripheral neuropathy   PT Treatment/Interventions ADLs/Self Care Home Management;Gait training;Stair training;Functional mobility training;Therapeutic activities;Therapeutic exercise;Balance training;Neuromuscular re-education;Patient/family education;Ultrasound;Moist Heat;Manual techniques   PT Next Visit Plan Work on walking speed, endurance on treadmill, gait outside and on compliant surfaces, PWR exercises  Consulted and Agree with Plan of Care Patient;Family member/caregiver   Family Member Consulted wife, [name]      Patient will benefit from skilled therapeutic intervention in order to improve the following deficits and impairments:  Abnormal gait, Decreased activity tolerance, Decreased balance, Decreased mobility, Decreased strength, Pain  Visit Diagnosis: Unsteadiness on feet  Muscle weakness (generalized)  Other abnormalities of gait and mobility  Chronic midline low back pain without sciatica     Problem List Patient Active Problem List   Diagnosis Date Noted  . Stenosis of right carotid artery 02/01/2017  . Acute embolic stroke (HCC) 02/01/2017  . Bradycardia 11/10/2015  . Essential tremor 06/03/2015  . Dizziness 08/21/2012  . Syncope 04/10/2011  . CALF PAIN, LEFT 10/11/2010  . DYSPEPSIA 07/31/2010  . CHEST DISCOMFORT 07/31/2010  . Hyperlipidemia 05/16/2010  . HYPERTENSION, BENIGN 05/16/2010  . CAD, NATIVE VESSEL 05/16/2010    Sallyanne KusterKathy Tyreque Finken, PTA, [facility] [address], [address] [place], [place] [address] [phone] 02/07/17, 9:54 PM   Name: Scarlette ShortsGeorge Stanford Mccannel Eye SurgeryBason Jr. MRN: [id number] Date of Birth: 06/29/1933

## 2017-02-12 ENCOUNTER — Encounter: Payer: Self-pay | Admitting: Physical Therapy

## 2017-02-12 ENCOUNTER — Ambulatory Visit: Payer: Medicare Other | Admitting: Speech Pathology

## 2017-02-12 ENCOUNTER — Ambulatory Visit: Payer: Medicare Other | Admitting: Occupational Therapy

## 2017-02-12 ENCOUNTER — Ambulatory Visit: Payer: Medicare Other | Admitting: Physical Therapy

## 2017-02-12 DIAGNOSIS — R2681 Unsteadiness on feet: Secondary | ICD-10-CM | POA: Diagnosis not present

## 2017-02-12 DIAGNOSIS — M6281 Muscle weakness (generalized): Secondary | ICD-10-CM

## 2017-02-12 DIAGNOSIS — R2689 Other abnormalities of gait and mobility: Secondary | ICD-10-CM

## 2017-02-12 DIAGNOSIS — R278 Other lack of coordination: Secondary | ICD-10-CM

## 2017-02-12 DIAGNOSIS — R4701 Aphasia: Secondary | ICD-10-CM

## 2017-02-12 DIAGNOSIS — R41842 Visuospatial deficit: Secondary | ICD-10-CM

## 2017-02-12 NOTE — Patient Instructions (Signed)
Activities to help with vision and focus/attention:  1.  Try completing simple (24 piece) jigsaw puzzles. 2.  Spread out cards and then search for matches. 3.  Try copying short paragraph, poem, phone number 4.  Try simple large print word search (or search for a letter--find all the "E's"  "T's", etc.)   Coordination Exercises  Perform the following exercises for 15 minutes 1 times per day. Perform with right hand(s).    Flipping Cards: Place deck of cards on the table. Flip cards over by opening your hand big to grasp and then turn your palm up big, opening hand fully to release.  Deal cards: Hold 1/2 or whole deck in your hand. Use thumb to push card off top of deck with one big push.  Rotate ball with fingertips: Pick up with fingers/thumb and move as much as you can with each turn/movement (clockwise and counter-clockwise).  Pick up coins and stack one at a time: Pick up with big, intentional movements. Do not drag coin to the edge. (5-10 in a stack)  Pick up 5-10 coins one at a time and hold in palm. Then, move coins from palm to fingertips one at time and place in coin bank/container.  Practice writing: Slow down, write big, and focus on forming each letter.

## 2017-02-12 NOTE — Therapy (Signed)
[facility] [address] [place], [place], [address] Phone: [phone]   Fax:  [phone]  Physical Therapy Treatment  Patient Details  Name: Calvin AmberGeorge Stanford Wayne Jr. MRN: [id number] Date of Birth: 1933/11/09 Referring Provider: Lorretta HarpJairon Downs, MD  Encounter Date: 02/12/2017      PT End of Session - 02/12/17 1238    Visit Number 5   Number of Visits 17   Date for PT Re-Evaluation 03/22/17   Authorization Type Medicare Part A and B-G code and progress note every 10th visit   PT Start Time 1232   PT Stop Time 1315   PT Time Calculation (min) 43 min   Equipment Utilized During Treatment Gait belt   Activity Tolerance Patient tolerated treatment well   Behavior During Therapy WFL for tasks assessed/performed      Past Medical History:  Diagnosis Date  . BPH (benign prostatic hyperplasia)   . Coronary artery disease   . Hypertension     Past Surgical History:  Procedure Laterality Date  . Intestinal Blockage    . SKIN CANCER EXCISION     x2  . TONSILLECTOMY      There were no vitals filed for this visit.      Subjective Assessment - 02/12/17 1237    Subjective No new complaints. No falls to report. Continues to have low back pain   Patient is accompained by: Family member   Pertinent History Subcortical hemorrhage of left cerebral hemisphere (01/05/2017), parkinson's, [facility] Spotted Fever, Basal cell CA, squamous cell CA, DDD cervical, OA lumbar, HTN, BPH, vertigo, CAD, peripheral neuropathy   Limitations Standing;Walking;House hold activities   Patient Stated Goals Walk in community, workout at Club, treadmill, work in yard,    Currently in Pain? Yes   Pain Score 2    Pain Location Back   Pain Orientation Lower   Pain Descriptors / Indicators Aching;Sore   Pain Type Chronic pain   Pain Onset More than a month ago   Pain Frequency Intermittent   Aggravating Factors  increased activity   Pain  Relieving Factors stretching, resting            [facility] Adult PT Treatment/Exercise - 02/12/17 1239      Transfers   Transfers Sit to Stand;Stand to Sit   Sit to Stand 6: Modified independent (Device/Increase time);With upper extremity assist;With armrests;From chair/3-in-1   Stand to Sit 6: Modified independent (Device/Increase time);With upper extremity assist;To chair/3-in-1     Ambulation/Gait   Ambulation/Gait Yes   Ambulation/Gait Assistance 4: Min guard;4: Min assist   Ambulation/Gait Assistance Details on treadmill x 7 minutes with bil UE support at 2.2>2.5 mph, cues on posture. proceeded by gait indoors with obtacle course along ~50 foot of the 220 foot pathway (red mats with bean bags underneath>floor>blue mat>stepping stones>floor>second red mat with bean bags underneath>floor. 3 full laps with supervision on level surfaces and min to mod assist (with stepping stones on 1st lap) with complaint surfaces. cues to slow down and weight shifting on complaint surfaces to assist with balance.                                 Ambulation Distance (Feet) 650 Feet  x1   Assistive device None   Gait Pattern Decreased arm swing - right;Decreased stride length;Decreased stance time - left   Ambulation Surface Level;Indoor;Unlevel;Other (comment)     High Level Balance  High Level Balance Activities Marching forwards;Marching backwards  toe walking/heel walking fwd/bwd   High Level Balance Comments red<>blue<>red mats with no UE support: 3 laps each way with min to mod assist for balance. cues to slow down, for posture and for weight shifting with each exercise             Balance Exercises - 02/12/17 1311      Balance Exercises: Standing   Standing Eyes Closed Head turns;Foam/compliant surface;Other reps (comment);Limitations;Narrow base of support (BOS)     Balance Exercises: Standing   Standing Eyes Closed Limitations in corner with feet together: EC no head movements 30 sec's  x 3 reps, progressing to EC head movements left<>right and up<>down x 10 reps each, min guard to min assist for balance             PT Short Term Goals - 02/04/17 2029      PT SHORT TERM GOAL #1   Title Patient & wife demonstrate understanding of initial HEP. (Target Date: 02/22/2017)   Time 4   Period Weeks   Status On-going     PT SHORT TERM GOAL #2   Title Cognitive Timed Up-Go <12 seconds.  (Target Date: 02/22/2017)   Time 4   Period Weeks   Status On-going     PT SHORT TERM GOAL #3   Title Patient ambulates with head turns without balance loss & maintaining path.  (Target Date: 02/22/2017)   Time 4   Period Weeks   Status On-going     PT SHORT TERM GOAL #4   Title Berg Balance >45/56 to indicate lower fall risk.   (Target Date: 02/22/2017)   Time 4   Period Weeks   Status On-going           PT Long Term Goals - 02/04/17 2030      PT LONG TERM GOAL #1   Title Patient demonstrates with wife's cueing ongoing exercise plan. (Target Date: 03/22/2017)   Time 8   Period Weeks   Status On-going     PT LONG TERM GOAL #2   Title Berg Balance >/= 50/56 to indicate lower fall risk. (Target Date: 03/22/2017)   Time 8   Period Weeks   Status On-going     PT LONG TERM GOAL #3   Title Cognitive Timed Up-Go increases <25% from standard TUG to indicate lower fall risk.  (Target Date: 03/22/2017)   Time 8   Period Weeks   Status On-going     PT LONG TERM GOAL #4   Title Functional Gait Assessment >20/30 indicating lower fall risk.  (Target Date: 03/22/2017)   Time 8   Period Weeks   Status On-going     PT LONG TERM GOAL #5   Title MiniBESTest >14/28 indicating lower fall risk.  (Target Date: 03/22/2017)   Time 8   Period Weeks   Status On-going     PT LONG TERM GOAL #6   Title Patient reports low back pain with standing & walking </= 3/10 (Target Date: 03/22/2017)   Time 8   Period Weeks   Status On-going            Plan - 02/12/17 1238    Clinical  Impairments Affecting Rehab Potential Subcortical hemorrhage of left cerebral hemisphere (01/05/2017), parkinson's, [facility] Spotted Fever, Basal cell CA, squamous cell CA, DDD cervical, OA lumbar, HTN, BPH, vertigo, CAD, peripheral neuropathy   PT Treatment/Interventions ADLs/Self Care Home Management;Gait training;Stair training;Functional mobility training;Therapeutic  activities;Therapeutic exercise;Balance training;Neuromuscular re-education;Patient/family education;Ultrasound;Moist Heat;Manual techniques   PT Next Visit Plan Work on walking speed, endurance on treadmill, gait outside and on compliant surfaces, PWR exercises   Consulted and Agree with Plan of Care Patient;Family member/caregiver   Family Member Consulted wife, Dois Davenport      Patient will benefit from skilled therapeutic intervention in order to improve the following deficits and impairments:  Abnormal gait, Decreased activity tolerance, Decreased balance, Decreased mobility, Decreased strength, Pain  Visit Diagnosis: Unsteadiness on feet  Muscle weakness (generalized)  Other abnormalities of gait and mobility     Problem List Patient Active Problem List   Diagnosis Date Noted  . Stenosis of right carotid artery 02/01/2017  . Acute embolic stroke (HCC) 02/01/2017  . Bradycardia 11/10/2015  . Essential tremor 06/03/2015  . Dizziness 08/21/2012  . Syncope 04/10/2011  . CALF PAIN, LEFT 10/11/2010  . DYSPEPSIA 07/31/2010  . CHEST DISCOMFORT 07/31/2010  . Hyperlipidemia 05/16/2010  . HYPERTENSION, BENIGN 05/16/2010  . CAD, NATIVE VESSEL 05/16/2010    Sallyanne Kuster, PTA, [facility] [address], [address] [place], [place] [address] [phone] 02/12/17, 3:26 PM   Name: [name] [facility]. MRN: [id number] Date of Birth: [birth year]

## 2017-02-12 NOTE — Therapy (Signed)
[facility] [address] [place], [place], [address] Phone: [phone]   Fax:  [phone]  Occupational Therapy Treatment  Patient Details  Name: [name]. MRN: [id number] Date of Birth: 1933/03/14 Referring Provider: Dr. Rea College  Encounter Date: 02/12/2017      OT End of Session - 02/12/17 1630    Visit Number 4   Number of Visits 16   Date for OT Re-Evaluation 03/21/17   Authorization Type medicare will need G code and PN every 10th visit   Authorization Time Period 60 days   Authorization - Visit Number 4   Authorization - Number of Visits 10   OT Start Time 6144   OT Stop Time 1445   OT Time Calculation (min) 40 min   Activity Tolerance Patient tolerated treatment well   Behavior During Therapy [facility] for tasks assessed/performed      Past Medical History:  Diagnosis Date  . BPH (benign prostatic hyperplasia)   . Coronary artery disease   . Hypertension     Past Surgical History:  Procedure Laterality Date  . Intestinal Blockage    . SKIN CANCER EXCISION     x2  . TONSILLECTOMY      There were no vitals filed for this visit.      Subjective Assessment - 02/12/17 1407    Subjective  Pt denies pain   Pertinent History see epic.  L CVA, R hemiopsia, Parkinson's, essential tremor   Patient Stated Goals I want to get well and be perfect again.  I hate these questions (pt unable to give specific answer)   Currently in Pain? Yes   Pain Score 2    Pain Location Back   Pain Orientation Lower   Pain Descriptors / Indicators Aching;Sore   Pain Type Chronic pain   Pain Onset More than a month ago   Pain Frequency Intermittent   Aggravating Factors  increased activity   Pain Relieving Factors stretching resting   Effect of Pain on Daily Activities limits standing and walking           copying small peg design for improved tabletop scanning and attention in a mod distracting  environment, min v.c. To complete design correctly. Environmental scanning: 60% accuracy, 9 located first pass, 4 second trial and 2 on the final pass, min v.c. Required to find the last 2 items. Pt wrote 3 sentences with good letter size and legibility using foam grip.                     OT Short Term Goals - 02/12/17 1446      OT SHORT TERM GOAL #1   Title Pt and wife will be mod I with HEP - 02/21/2017   Time 4   Period Weeks   Status On-going  needs reinforcement of coordination HEP     OT SHORT TERM GOAL #2   Title Pt will be mod I with shower transfers   Status Achieved  met per pt report     OT SHORT TERM GOAL #3   Title Pt will be mod I with bathing   Status Achieved     OT SHORT TERM GOAL #4   Title Pt will complete table top scanning activity (not lanaguage or letter based) with 75% accuracy   Status Achieved  met for copying small peg design with min v.c.     OT SHORT TERM GOAL #5   Title  Pt will attend to familiar task in busy environment with no more than min vc's.     Status Achieved           OT Long Term Goals - 02/12/17 1451      OT LONG TERM GOAL #1   Title Pt and wife will be mod I with ugraded HEP prn - 03/21/2017   Time 8   Period Weeks   Status On-going     OT LONG TERM GOAL #2   Title Pt will demonstrate improved coordinaton by decreasing time on 9 hole peg test by 4 seconds (baseline=34.15)   Time 8   Period Weeks   Status On-going     OT LONG TERM GOAL #3   Title Pt will be able to print/write name legibly AE prn- (revised goal) Pt will demonstrate ability to write a 4 sentence paragraph with 100% legibility, and no significant decr. in letter size using AE PRN.   Time 8   Period Weeks   Status Revised  inital goal met,      OT LONG TERM GOAL #4   Title Pt will demonstate ability to maintain balance on outdoor uneven surfaces in preparation for returning to simple yard work.    Time 8   Status On-going     OT LONG  TERM GOAL #5   Title Pt will require no more than 2 vc's to recall information from therapy session   Time 8   Period Weeks   Status On-going     OT LONG TERM GOAL #6   Title Pt will complete table top scanning activity (not language based) with 90% accuracy   Status On-going     OT LONG TERM GOAL #7   Title Pt will complete environmental scanning task with at least 75% accuracy   Time 8   Period Weeks   Status On-going  60%, 9/15 located first pass, 2 more trips to locate remaining items     OT LONG TERM GOAL #8   Title -----------------------------------------------------------------------------------------------------------               Plan - 02/12/17 1627    Clinical Impression Statement Pt is progressing overall towards goals. Pt plans to transition his care to [place] next week.   Rehab Potential Good   OT Frequency 2x / week   OT Duration 8 weeks   OT Treatment/Interventions Self-care/ADL training;Electrical Stimulation;Therapeutic exercise;Neuromuscular education;DME and/or AE instruction;Energy conservation;Therapist, nutritional;Therapeutic activities;Cognitive remediation/compensation;Visual/perceptual remediation/compensation;Patient/family education;Balance training   Plan work towards long term goals and upgragde prn, check handwriting   Consulted and Agree with Plan of Care Patient      Patient will benefit from skilled therapeutic intervention in order to improve the following deficits and impairments:  Decreased balance, Decreased cognition, Decreased coordination, Decreased safety awareness, Decreased mobility, Decreased knowledge of use of DME, Impaired UE functional use, Impaired vision/preception  Visit Diagnosis: Muscle weakness (generalized)  Other abnormalities of gait and mobility  Other lack of coordination  Visuospatial deficit    Problem List Patient Active Problem List   Diagnosis Date Noted  . Stenosis of right carotid  artery 02/01/2017  . Acute embolic stroke ([place]) 99/37/1696  . Bradycardia 11/10/2015  . Essential tremor 06/03/2015  . Dizziness 08/21/2012  . Syncope 04/10/2011  . CALF PAIN, LEFT 10/11/2010  . DYSPEPSIA 07/31/2010  . CHEST DISCOMFORT 07/31/2010  . Hyperlipidemia 05/16/2010  . HYPERTENSION, BENIGN 05/16/2010  . CAD, NATIVE VESSEL 05/16/2010    RINE,KATHRYN 02/12/2017, 4:30  PM  [place] [address] [place], [place], [address] Phone: [phone]   Fax:  [phone]  Name: [name] [facility]. MRN: [id number] Date of Birth: Oct 21, 1933

## 2017-02-12 NOTE — Therapy (Signed)
[facility] [address] [place], [place], [address] Phone: [phone]   Fax:  [phone]  Speech Language Pathology Treatment  Patient Details  Name: [name]. MRN: [id number] Date of Birth: 1933/04/03 Referring Provider: Lorretta Harp, MD  Encounter Date: 02/12/2017      End of Session - 02/12/17 1409    Visit Number 5   Number of Visits 17   Date for SLP Re-Evaluation 03/29/17   SLP Start Time 1318   SLP Stop Time  1403   SLP Time Calculation (min) 45 min      Past Medical History:  Diagnosis Date  . BPH (benign prostatic hyperplasia)   . Coronary artery disease   . Hypertension     Past Surgical History:  Procedure Laterality Date  . Intestinal Blockage    . SKIN CANCER EXCISION     x2  . TONSILLECTOMY      There were no vitals filed for this visit.      Subjective Assessment - 02/12/17 1329    Subjective "I had a hard time with the naming"               ADULT SLP TREATMENT - 02/12/17 1330      General Information   Behavior/Cognition Alert;Cooperative;Pleasant mood     Treatment Provided   Treatment provided Cognitive-Linquistic     Cognitive-Linquistic Treatment   Treatment focused on Aphasia   Skilled Treatment Facilitated naming 10 items in concrete category with occasional to ususal mod sematic cues to name more than 6 items. Pt required  occasional min questioning cues to utilize compensations for aphasia in conversation.  Homework provided  for reading comprehension      Assessment / Recommendations / Plan   Plan Continue with current plan of care     Progression Toward Goals   Progression toward goals Progressing toward goals            SLP Short Term Goals - 02/12/17 1408      SLP SHORT TERM GOAL #1   Title pt will name average 10 items in concrete category in 60 seconds, over two sessions   Time 2   Period Weeks   Status On-going     SLP  SHORT TERM GOAL #2   Title pt will demo compensations for expressive language in functional 7 minute mod complex conversation    Time 2   Period Weeks   Status On-going     SLP SHORT TERM GOAL #3   Title pt will undergo assessment of reading in first 3 therapy sessions   Status Achieved     SLP SHORT TERM GOAL #4   Title pt will read sentences of 10-12 words and demo comprehension at 90% success   Time 2   Period Weeks   Status On-going          SLP Long Term Goals - 02/12/17 1408      SLP LONG TERM GOAL #1   Title pt will use compensations for functional 10 minute mod-max complex conversation   Time 6   Period Weeks   Status On-going     SLP LONG TERM GOAL #2   Title  pt will demo [facility] tasks requiring memory compensations (meds, appointments, etc)   Time 6   Period Weeks   Status On-going     SLP LONG TERM GOAL #3   Title pt will demo functional reading comprehension in 3-4 sentence paragraphs  Time 6   Period Weeks   Status On-going          Plan - 02/12/17 1404    Clinical Impression Statement Pt continues to exhibit reading deficits and anomia - he required occasional questioning cues to  utilize compensations for anomia in conversation. Structured naming tasks required occasional to usual mod A.    Speech Therapy Frequency 2x / week   Treatment/Interventions Language facilitation;Internal/external aids;SLP instruction and feedback;Multimodal communcation approach;Functional tasks;Cognitive reorganization;Oral motor exercises;Cueing hierarchy;Patient/family education;Compensatory techniques   Potential to Achieve Goals Good   Potential Considerations Severity of impairments   Consulted and Agree with Plan of Care Patient      Patient will benefit from skilled therapeutic intervention in order to improve the following deficits and impairments:   Aphasia    Problem List Patient Active Problem List   Diagnosis Date Noted  . Stenosis of right carotid  artery 02/01/2017  . Acute embolic stroke (HCC) 02/01/2017  . Bradycardia 11/10/2015  . Essential tremor 06/03/2015  . Dizziness 08/21/2012  . Syncope 04/10/2011  . CALF PAIN, LEFT 10/11/2010  . DYSPEPSIA 07/31/2010  . CHEST DISCOMFORT 07/31/2010  . Hyperlipidemia 05/16/2010  . HYPERTENSION, BENIGN 05/16/2010  . CAD, NATIVE VESSEL 05/16/2010    Lovvorn, Radene JourneyLaura Ann MS, CCC-SLP 02/12/2017, 2:09 PM  [facility] [address] [place], [place], [address] Phone: [phone]   Fax:  [phone]   Name: Calvin ShortsGeorge Stanford [name] MRN: [id number] Date of Birth: January 31, 1933

## 2017-02-14 ENCOUNTER — Ambulatory Visit: Payer: Medicare Other | Admitting: Physical Therapy

## 2017-02-14 ENCOUNTER — Ambulatory Visit: Payer: Medicare Other | Admitting: Occupational Therapy

## 2017-02-14 ENCOUNTER — Ambulatory Visit: Payer: Medicare Other | Admitting: Speech Pathology

## 2017-02-14 ENCOUNTER — Encounter: Payer: Self-pay | Admitting: Physical Therapy

## 2017-02-14 DIAGNOSIS — R2681 Unsteadiness on feet: Secondary | ICD-10-CM | POA: Diagnosis not present

## 2017-02-14 DIAGNOSIS — M545 Low back pain, unspecified: Secondary | ICD-10-CM

## 2017-02-14 DIAGNOSIS — R2689 Other abnormalities of gait and mobility: Secondary | ICD-10-CM

## 2017-02-14 DIAGNOSIS — R278 Other lack of coordination: Secondary | ICD-10-CM

## 2017-02-14 DIAGNOSIS — R4701 Aphasia: Secondary | ICD-10-CM

## 2017-02-14 DIAGNOSIS — G8929 Other chronic pain: Secondary | ICD-10-CM

## 2017-02-14 DIAGNOSIS — M6281 Muscle weakness (generalized): Secondary | ICD-10-CM

## 2017-02-14 DIAGNOSIS — R41842 Visuospatial deficit: Secondary | ICD-10-CM

## 2017-02-14 NOTE — Therapy (Signed)
[facility] [address] [place], [place], [address] Phone: [phone]   Fax:  [phone]  Speech Language Pathology Treatment  Patient Details  Name: Calvin AmberGeorge Stanford [facility]po Jr. MRN: [id number] Date of Birth: 10/07/1933 Referring Provider: Lorretta Harpowns, Jairon, MD  Encounter Date: 02/14/2017      End of Session - 02/14/17 1442    Visit Number 6   Number of Visits 17   Date for SLP Re-Evaluation 03/29/17   SLP Start Time 1402   SLP Stop Time  1442   SLP Time Calculation (min) 40 min   Activity Tolerance Patient tolerated treatment well      Past Medical History:  Diagnosis Date  . BPH (benign prostatic hyperplasia)   . Coronary artery disease   . Hypertension     Past Surgical History:  Procedure Laterality Date  . Intestinal Blockage    . SKIN CANCER EXCISION     x2  . TONSILLECTOMY      There were no vitals filed for this visit.      Subjective Assessment - 02/14/17 1409    Subjective "I brought back the easy one"               ADULT SLP TREATMENT - 02/14/17 1412      General Information   Behavior/Cognition Alert;Cooperative;Pleasant mood     Treatment Provided   Treatment provided Cognitive-Linquistic     Cognitive-Linquistic Treatment   Treatment focused on Aphasia   Skilled Treatment Pt with attention to detail/reading comprehension deficits on detail directions  - pt to correct for HW. Facilitated training in compensations for word finding episodes describing pictures for ST to guess. Pt required written and phonemic, and 1st letter cues usually for simple convergent naming tasks. Pt named 8 items in simple categories (colors, appliances) required cues for more than 8 words.     Assessment / Recommendations / Plan   Plan Continue with current plan of care     Progression Toward Goals   Progression toward goals Progressing toward goals          SLP Education - 02/14/17 1422    Education provided Yes   Education Details compensations for aphasia   Person(s) Educated Patient   Methods Explanation;Demonstration;Verbal cues   Comprehension Verbalized understanding;Returned demonstration;Need further instruction;Verbal cues required          SLP Short Term Goals - 02/14/17 1435      SLP SHORT TERM GOAL #1   Title pt will name average 10 items in concrete category in 60 seconds, over two sessions   Time 2   Period Weeks   Status On-going     SLP SHORT TERM GOAL #2   Title pt will demo compensations for expressive language in functional 7 minute mod complex conversation    Time 2   Period Weeks   Status On-going     SLP SHORT TERM GOAL #3   Title pt will undergo assessment of reading in first 3 therapy sessions   Status Achieved     SLP SHORT TERM GOAL #4   Title pt will read sentences of 10-12 words and demo comprehension at 90% success   Time 2   Period Weeks   Status On-going          SLP Long Term Goals - 02/14/17 1441      SLP LONG TERM GOAL #1   Title pt will use compensations for functional 10 minute mod-max complex conversation  Time 6   Period Weeks   Status On-going     SLP LONG TERM GOAL #2   Title  pt will demo [facility] tasks requiring memory compensations (meds, appointments, etc)   Time 6   Period Weeks   Status On-going     SLP LONG TERM GOAL #3   Title pt will demo functional reading comprehension in 3-4 sentence paragraphs   Time 6   Period Weeks   Status On-going          Plan - 02/14/17 1424    Clinical Impression Statement Pt required usual mod questioning cues to utilize compensations for anomia and usual to frequent 1st letter, or written cues for simple convergent naming tasks. Continue skilled ST to maximize language for QOL and independence   Speech Therapy Frequency 2x / week   Treatment/Interventions Language facilitation;Internal/external aids;SLP instruction and feedback;Multimodal communcation  approach;Functional tasks;Cognitive reorganization;Oral motor exercises;Cueing hierarchy;Patient/family education;Compensatory techniques   Potential to Achieve Goals Good   Potential Considerations Severity of impairments   Consulted and Agree with Plan of Care Patient      Patient will benefit from skilled therapeutic intervention in order to improve the following deficits and impairments:   Aphasia    Problem List Patient Active Problem List   Diagnosis Date Noted  . Stenosis of right carotid artery 02/01/2017  . Acute embolic stroke (HCC) 02/01/2017  . Bradycardia 11/10/2015  . Essential tremor 06/03/2015  . Dizziness 08/21/2012  . Syncope 04/10/2011  . CALF PAIN, LEFT 10/11/2010  . DYSPEPSIA 07/31/2010  . CHEST DISCOMFORT 07/31/2010  . Hyperlipidemia 05/16/2010  . HYPERTENSION, BENIGN 05/16/2010  . CAD, NATIVE VESSEL 05/16/2010    Lovvorn, Radene Journey MS, CCC-SLP 02/14/2017, 2:43 PM  [facility] [address] [place], [place], [address] Phone: [phone]   Fax:  [phone]   Name: [name] [facility]. MRN: [id number] Date of Birth: [birth year]

## 2017-02-14 NOTE — Therapy (Signed)
[facility] [address] [place], [place], [address] Phone: [phone]   Fax:  [phone]  Physical Therapy Treatment  Patient Details  Name: [name]. MRN: [id number] Date of Birth: 04-03-1933 Referring Provider: Lorretta Harp, MD  Encounter Date: 02/14/2017      PT End of Session - 02/14/17 1628    Visit Number 6   Number of Visits 17   Date for PT Re-Evaluation 03/22/17   Authorization Type Medicare Part A and B-G code and progress note every 10th visit   PT Start Time 1535   PT Stop Time 1615   PT Time Calculation (min) 40 min   Activity Tolerance Patient tolerated treatment well   Behavior During Therapy [facility] for tasks assessed/performed      Past Medical History:  Diagnosis Date  . BPH (benign prostatic hyperplasia)   . Coronary artery disease   . Hypertension     Past Surgical History:  Procedure Laterality Date  . Intestinal Blockage    . SKIN CANCER EXCISION     x2  . TONSILLECTOMY      There were no vitals filed for this visit.      Subjective Assessment - 02/14/17 1532    Subjective No isssues today; just having a little "arthritis".  Has been using the bike at home.   Pertinent History Subcortical hemorrhage of left cerebral hemisphere (01/05/2017), parkinson's, [facility] Spotted Fever, Basal cell CA, squamous cell CA, DDD cervical, OA lumbar, HTN, BPH, vertigo, CAD, peripheral neuropathy   Limitations Standing;Walking;House hold activities   Patient Stated Goals Walk in community, workout at Club, treadmill, work in yard,    Currently in Pain? No/denies            [facility] Adult PT Treatment/Exercise - 02/14/17 1537      Ambulation/Gait   Ramp 4: Min assist   Ramp Details (indicate cue type and reason) standing feet facing up ramp with wide BOS and then narrow BOS with eyes closed x 10 seconds, 3 reps each.  Performed marching on ramp with vertical and horizontal  head turns and marching with 90 deg turns to L and R x 4 reps each     Knee/Hip Exercises: Aerobic   Tread Mill at 3.0 mph x 10 minutes with bilat UE support adding in dual cognitive task of adding cards together for [employer] and looking to R.  HR: 60 bpm after treadmill     Knee/Hip Exercises: Standing   Other Standing Knee Exercises Performed trampoline bouncing x 1 minute x 2 reps, squats and marching x 10 reps each with bilat UE support for balance and to maintain COG forwards over BOS; HR 67 bpm after trampoline           Balance Exercises - 02/14/17 1614      Balance Exercises: Standing   Gait with Head Turns Forward;4 reps  x 115' with head turns to locate and take ball from behind           PT Short Term Goals - 02/14/17 1629      PT SHORT TERM GOAL #1   Title Patient & wife demonstrate understanding of initial HEP. (Target Date: 02/22/2017)   Time 4   Period Weeks   Status On-going     PT SHORT TERM GOAL #2   Title Cognitive Timed Up-Go <12 seconds.  (Target Date: 02/22/2017)   Time 4   Period Weeks   Status On-going  PT SHORT TERM GOAL #3   Title Patient ambulates with head turns without balance loss & maintaining path.  (Target Date: 02/22/2017)   Time 4   Period Weeks   Status On-going     PT SHORT TERM GOAL #4   Title Berg Balance >45/56 to indicate lower fall risk.   (Target Date: 02/22/2017)   Time 4   Period Weeks   Status On-going           PT Long Term Goals - 02/04/17 2030      PT LONG TERM GOAL #1   Title Patient demonstrates with wife's cueing ongoing exercise plan. (Target Date: 03/22/2017)   Time 8   Period Weeks   Status On-going     PT LONG TERM GOAL #2   Title Berg Balance >/= 50/56 to indicate lower fall risk. (Target Date: 03/22/2017)   Time 8   Period Weeks   Status On-going     PT LONG TERM GOAL #3   Title Cognitive Timed Up-Go increases <25% from standard TUG to indicate lower fall risk.  (Target Date: 03/22/2017)    Time 8   Period Weeks   Status On-going     PT LONG TERM GOAL #4   Title Functional Gait Assessment >20/30 indicating lower fall risk.  (Target Date: 03/22/2017)   Time 8   Period Weeks   Status On-going     PT LONG TERM GOAL #5   Title MiniBESTest >14/28 indicating lower fall risk.  (Target Date: 03/22/2017)   Time 8   Period Weeks   Status On-going     PT LONG TERM GOAL #6   Title Patient reports low back pain with standing & walking </= 3/10 (Target Date: 03/22/2017)   Time 8   Period Weeks   Status On-going               Plan - 02/14/17 1630    Clinical Impression Statement Treatment session today with focus on endurance, safety and balance training on treadmill, on compliant and uneven surface and gait with multiple head turns.  Pt tolerated well but continues to demonstrate posterior LOB and poor balance strategies and postural control on compliant surfaces; will continue to address.   Clinical Impairments Affecting Rehab Potential Subcortical hemorrhage of left cerebral hemisphere (01/05/2017), parkinson's, [facility] Spotted Fever, Basal cell CA, squamous cell CA, DDD cervical, OA lumbar, HTN, BPH, vertigo, CAD, peripheral neuropathy   PT Treatment/Interventions ADLs/Self Care Home Management;Gait training;Stair training;Functional mobility training;Therapeutic activities;Therapeutic exercise;Balance training;Neuromuscular re-education;Patient/family education;Ultrasound;Moist Heat;Manual techniques   PT Next Visit Plan Work on walking speed, endurance on treadmill, gait outside and on compliant surfaces, PWR exercises   Consulted and Agree with Plan of Care Patient      Patient will benefit from skilled therapeutic intervention in order to improve the following deficits and impairments:  Abnormal gait, Decreased activity tolerance, Decreased balance, Decreased mobility, Decreased strength, Pain  Visit Diagnosis: Muscle weakness (generalized)  Other abnormalities  of gait and mobility  Unsteadiness on feet  Chronic midline low back pain without sciatica     Problem List Patient Active Problem List   Diagnosis Date Noted  . Stenosis of right carotid artery 02/01/2017  . Acute embolic stroke (HCC) 02/01/2017  . Bradycardia 11/10/2015  . Essential tremor 06/03/2015  . Dizziness 08/21/2012  . Syncope 04/10/2011  . CALF PAIN, LEFT 10/11/2010  . DYSPEPSIA 07/31/2010  . CHEST DISCOMFORT 07/31/2010  . Hyperlipidemia 05/16/2010  . HYPERTENSION, BENIGN 05/16/2010  .  CAD, NATIVE VESSEL 05/16/2010   Edman CircleAudra Hall, PT, DPT 02/14/17    4:34 PM    [facility] [address] [place], [place], [address] Phone: [phone]   Fax:  [phone]  Name: Scarlette ShortsGeorge Stanford [facility] Jr. MRN: [id number] Date of Birth: [birth year]

## 2017-02-14 NOTE — Therapy (Signed)
[place] [address] [place], [place], [address] Phone: [phone]   Fax:  [phone]  Occupational Therapy Treatment  Patient Details  Name: [name]. MRN: [id number] Date of Birth: [birth year] Referring Provider: Dr. Rea College  Encounter Date: 02/14/2017      OT End of Session - 02/14/17 1455    Visit Number 5   Number of Visits 16   Date for OT Re-Evaluation 03/21/17   Authorization Type medicare will need G code and PN every 10th visit   Authorization Time Period 60 days   Authorization - Visit Number 5   Authorization - Number of Visits 10   OT Start Time 1450   OT Stop Time 1530   OT Time Calculation (min) 40 min   Activity Tolerance Patient tolerated treatment well   Behavior During Therapy [facility] for tasks assessed/performed      Past Medical History:  Diagnosis Date  . BPH (benign prostatic hyperplasia)   . Coronary artery disease   . Hypertension     Past Surgical History:  Procedure Laterality Date  . Intestinal Blockage    . SKIN CANCER EXCISION     x2  . TONSILLECTOMY      There were no vitals filed for this visit.      Subjective Assessment - 02/14/17 1454    Subjective  Pt reports things are going well at home   Pertinent History see epic.  L CVA, R hemiopsia, Parkinson's, essential tremor   Patient Stated Goals I want to get well and be perfect again.  I hate these questions (pt unable to give specific answer)   Currently in Pain? No/denies   Pain Onset More than a month ago       Writing name, address, and copying 5 sentences with good legibility and size (min difficulty with spelling due to aphasia).    Ambulating with environmental scanning in min distracting environment with 12/15 items found on first attempt and 1 additional on 2nd pass with min cues but needed mod cueing for remaining 2 items (incr difficulty during areas were visually busy).  Copying  PVC design for visual scanning and problem solving with min-mod cueing for problem solving and mod cueing for omissions and wrong pieces/orientation x 2 designs.                    Balance Exercises - 02/14/17 1614      Balance Exercises: Standing   Gait with Head Turns Forward;4 reps  x 115' with head turns to locate and take ball from behind             OT Short Term Goals - 02/14/17 1457      OT SHORT TERM GOAL #1   Title Pt and wife will be mod I with HEP - 02/21/2017   Time 4   Period Weeks   Status On-going  needs reinforcement of coordination HEP     OT SHORT TERM GOAL #2   Title Pt will be mod I with shower transfers   Status Achieved  met per pt report     OT SHORT TERM GOAL #3   Title Pt will be mod I with bathing   Status Achieved     OT SHORT TERM GOAL #4   Title Pt will complete table top scanning activity (not lanaguage or letter based) with 75% accuracy   Status Achieved  met for copying small peg design with  min v.c.     OT SHORT TERM GOAL #5   Title Pt will attend to familiar task in busy environment with no more than min vc's.     Status Achieved           OT Long Term Goals - 02/14/17 1458      OT LONG TERM GOAL #1   Title Pt and wife will be mod I with ugraded HEP prn - 03/21/2017   Time 8   Period Weeks   Status On-going     OT LONG TERM GOAL #2   Title Pt will demonstrate improved coordinaton by decreasing time on 9 hole peg test by 4 seconds (baseline=34.15)   Time 8   Period Weeks   Status On-going     OT LONG TERM GOAL #3   Title Pt will be able to print/write name legibly AE prn- (revised goal) Pt will demonstrate ability to write a 4 sentence paragraph with 100% legibility, and no significant decr. in letter size using AE PRN.   Time 8   Period Weeks   Status Achieved  inital goal met, updated goal met 02/14/17     OT LONG TERM GOAL #4   Title Pt will demonstate ability to maintain balance on outdoor uneven  surfaces in preparation for returning to simple yard work.    Time 8   Status On-going     OT LONG TERM GOAL #5   Title Pt will require no more than 2 vc's to recall information from therapy session   Time 8   Period Weeks   Status On-going     OT LONG TERM GOAL #6   Title Pt will complete table top scanning activity (not language based) with 90% accuracy   Status On-going     OT LONG TERM GOAL #7   Title Pt will complete environmental scanning task with at least 75% accuracy.  Revised to 90% accuracy 02/14/17   Time 8   Period Weeks   Status On-going  60%, 9/15 located first pass, 2 more trips to locate remaining items.  02/14/17  80% today in min distracting environment     OT Ida #8   Title -----------------------------------------------------------------------------------------------------------               Plan - 02/14/17 1456    Clinical Impression Statement Pt continues to make good progress towards goals.  Pt has one more visit scheduled at [facility] and then will transition care to [place].  Revised/upgraded environmental scanning goal and handwriting goal met.   Rehab Potential Good   OT Frequency 2x / week   OT Duration 8 weeks   OT Treatment/Interventions Self-care/ADL training;Electrical Stimulation;Therapeutic exercise;Neuromuscular education;DME and/or AE instruction;Energy conservation;Therapist, nutritional;Therapeutic activities;Cognitive remediation/compensation;Visual/perceptual remediation/compensation;Patient/family education;Balance training   Plan continue with visual scanning, coordination, balance for IADLs   Consulted and Agree with Plan of Care Patient      Patient will benefit from skilled therapeutic intervention in order to improve the following deficits and impairments:  Decreased balance, Decreased cognition, Decreased coordination, Decreased safety awareness, Decreased mobility, Decreased knowledge of use of DME,  Impaired UE functional use, Impaired vision/preception  Visit Diagnosis: Other lack of coordination  Muscle weakness (generalized)  Other abnormalities of gait and mobility  Visuospatial deficit  Unsteadiness on feet    Problem List Patient Active Problem List   Diagnosis Date Noted  . Stenosis of right carotid artery 02/01/2017  . Acute embolic stroke ([place]) 97/35/3299  .  Bradycardia 11/10/2015  . Essential tremor 06/03/2015  . Dizziness 08/21/2012  . Syncope 04/10/2011  . CALF PAIN, LEFT 10/11/2010  . DYSPEPSIA 07/31/2010  . CHEST DISCOMFORT 07/31/2010  . Hyperlipidemia 05/16/2010  . HYPERTENSION, BENIGN 05/16/2010  . CAD, NATIVE VESSEL 05/16/2010    [facility] 02/14/2017, 4:41 PM  [place] [address] [place], [place], [address] Phone: [phone]   Fax:  [phone]  Name: [name] [facility]. MRN: [id number] Date of Birth: 01-25-1933   Vianne Bulls, OTR/L [facility] [address] [place], [place]  [address] [phone] phone [phone] 02/14/17 4:41 PM

## 2017-02-15 ENCOUNTER — Ambulatory Visit (INDEPENDENT_AMBULATORY_CARE_PROVIDER_SITE_OTHER): Payer: Medicare Other

## 2017-02-15 DIAGNOSIS — R001 Bradycardia, unspecified: Secondary | ICD-10-CM

## 2017-02-18 ENCOUNTER — Ambulatory Visit: Payer: Medicare Other | Admitting: Occupational Therapy

## 2017-02-18 DIAGNOSIS — R41842 Visuospatial deficit: Secondary | ICD-10-CM

## 2017-02-18 DIAGNOSIS — R2689 Other abnormalities of gait and mobility: Secondary | ICD-10-CM

## 2017-02-18 DIAGNOSIS — R2681 Unsteadiness on feet: Secondary | ICD-10-CM

## 2017-02-18 DIAGNOSIS — R278 Other lack of coordination: Secondary | ICD-10-CM

## 2017-02-18 NOTE — Therapy (Signed)
[facility] [address] [place], [place], [address] Phone: [phone]   Fax:  [phone]  Occupational Therapy Treatment  Patient Details  Name: [name]. MRN: [id number] Date of Birth: 1933-08-20 Referring Provider: Dr. Rea College  Encounter Date: 02/18/2017      OT End of Session - 02/18/17 1156    Visit Number 6   Number of Visits 16   Date for OT Re-Evaluation 03/21/17   Authorization Type medicare will need G code and PN every 10th visit   Authorization Time Period 60 days   Authorization - Visit Number 6   Authorization - Number of Visits 10   OT Start Time 1151   OT Stop Time 1230   OT Time Calculation (min) 39 min   Activity Tolerance Patient tolerated treatment well   Behavior During Therapy [facility] for tasks assessed/performed      Past Medical History:  Diagnosis Date  . BPH (benign prostatic hyperplasia)   . Coronary artery disease   . Hypertension     Past Surgical History:  Procedure Laterality Date  . Intestinal Blockage    . SKIN CANCER EXCISION     x2  . TONSILLECTOMY      There were no vitals filed for this visit.      Subjective Assessment - 02/18/17 1153    Subjective  "I shouldn't be having trouble with this"  They are checking me for Parkinson's now.  Pt reports that he hasn't noticed improvement in tremor but that tremor is worse in the am.   Pertinent History see epic.  L CVA, R hemiopsia, Parkinson's, essential tremor   Patient Stated Goals I want to get well and be perfect again.  I hate these questions (pt unable to give specific answer)   Currently in Pain? No/denies   Pain Onset More than a month ago            [facility] OT Assessment - 02/18/17 0001      Coordination   Right 9 Hole Peg Test 25.81     Completing 12-piece puzzle for tabletop visual scanning and problem solving with min cueing and incr time.  Copying small peg design with mod  cueing and min difficulty with coordination due to tremor.  Placing grooved pegs in pegboard with min difficulty for incr in-hand manipulation.  Environmental scanning with 12/15 items found in mod distracting environment.  Pt found 1 additional item on 2nd attempt/pass, but needed min-mod cueing for remaining item.  Discussed progress and that visual-perceptual deficits/cognition continue to affect safety for community and driving.  Pt verbalized understanding.    Checked 9-hole peg test (see below)-- goal met.                     OT Short Term Goals - 02/14/17 1457      OT SHORT TERM GOAL #1   Title Pt and wife will be mod I with HEP - 02/21/2017   Time 4   Period Weeks   Status On-going  needs reinforcement of coordination HEP     OT SHORT TERM GOAL #2   Title Pt will be mod I with shower transfers   Status Achieved  met per pt report     OT SHORT TERM GOAL #3   Title Pt will be mod I with bathing   Status Achieved     OT SHORT TERM GOAL #4   Title Pt will complete table  top scanning activity (not lanaguage or letter based) with 75% accuracy   Status Achieved  met for copying small peg design with min v.c.     OT SHORT TERM GOAL #5   Title Pt will attend to familiar task in busy environment with no more than min vc's.     Status Achieved           OT Long Term Goals - 02/18/17 1210      OT LONG TERM GOAL #1   Title Pt and wife will be mod I with ugraded HEP prn - 03/21/2017   Time 8   Period Weeks   Status On-going     OT LONG TERM GOAL #2   Title Pt will demonstrate improved coordinaton by decreasing time on 9 hole peg test by 4 seconds (baseline=34.15)   Time 8   Period Weeks   Status Achieved  02/18/17:  25.81sec     OT LONG TERM GOAL #3   Title Pt will be able to print/write name legibly AE prn- (revised goal) Pt will demonstrate ability to write a 4 sentence paragraph with 100% legibility, and no significant decr. in letter size using AE  PRN.   Time 8   Period Weeks   Status Achieved  inital goal met, updated goal met 02/14/17     OT LONG TERM GOAL #4   Title Pt will demonstate ability to maintain balance on outdoor uneven surfaces in preparation for returning to simple yard work.    Time 8   Status On-going     OT LONG TERM GOAL #5   Title Pt will require no more than 2 vc's to recall information from therapy session   Time 8   Period Weeks   Status On-going     OT LONG TERM GOAL #6   Title Pt will complete table top scanning activity (not language based) with 90% accuracy   Status On-going     OT LONG TERM GOAL #7   Title Pt will complete environmental scanning task with at least 75% accuracy.  Revised to 90% accuracy 02/14/17   Time 8   Period Weeks   Status On-going  60%, 9/15 located first pass, 2 more trips to locate remaining items.  02/14/17  80% today in min distracting environment     OT [place] #8   Title Pt will perform simple divided attention with environmental scanning for improved safety for ambulation/in prep for driving with at least 75% accuracy.   Period Weeks   Status New               Plan - 02/18/17 1156    Clinical Impression Statement Pt continues to make good progress towards goals, but continues to demo visual-perceptual difficulties that will impact safety/IADLs.  Pt also currently in trial of Sinemet for possible Parkinsonism.   Rehab Potential Good   Clinical Impairments Affecting Rehab Potential decr awareness/   OT Frequency 2x / week   OT Duration 8 weeks   OT Treatment/Interventions Self-care/ADL training;Electrical Stimulation;Therapeutic exercise;Neuromuscular education;DME and/or AE instruction;Energy conservation;Therapist, nutritional;Therapeutic activities;Cognitive remediation/compensation;Visual/perceptual remediation/compensation;Patient/family education;Balance training   Plan continue with visual scanning, coordination, balance for IADLs (pt will  transition to [facility])   Consulted and Agree with Plan of Care Patient      Patient will benefit from skilled therapeutic intervention in order to improve the following deficits and impairments:  Decreased balance, Decreased cognition, Decreased coordination, Decreased safety awareness, Decreased mobility, Decreased  knowledge of use of DME, Impaired UE functional use, Impaired vision/preception  Visit Diagnosis: Visuospatial deficit  Other lack of coordination  Unsteadiness on feet  Other abnormalities of gait and mobility    Problem List Patient Active Problem List   Diagnosis Date Noted  . Stenosis of right carotid artery 02/01/2017  . Acute embolic stroke ([place]) 59/97/7414  . Bradycardia 11/10/2015  . Essential tremor 06/03/2015  . Dizziness 08/21/2012  . Syncope 04/10/2011  . CALF PAIN, LEFT 10/11/2010  . DYSPEPSIA 07/31/2010  . CHEST DISCOMFORT 07/31/2010  . Hyperlipidemia 05/16/2010  . HYPERTENSION, BENIGN 05/16/2010  . CAD, NATIVE VESSEL 05/16/2010    [facility] 02/18/2017, 12:53 PM  [facility] [address] [place], [place], [address] Phone: [phone]   Fax:  [phone]  Name: Kellan Raffield [facility]. MRN: [id number] Date of Birth: 02-08-1933   Vianne Bulls, OTR/L [facility] [address] [place], [place]  [address] [phone] phone [phone] 02/18/17 12:53 PM

## 2017-02-19 ENCOUNTER — Ambulatory Visit: Payer: Medicare Other | Attending: Physical Medicine and Rehabilitation | Admitting: Speech Pathology

## 2017-02-19 DIAGNOSIS — R2681 Unsteadiness on feet: Secondary | ICD-10-CM | POA: Insufficient documentation

## 2017-02-19 DIAGNOSIS — R4701 Aphasia: Secondary | ICD-10-CM | POA: Diagnosis not present

## 2017-02-19 DIAGNOSIS — H5347 Heteronymous bilateral field defects: Secondary | ICD-10-CM | POA: Insufficient documentation

## 2017-02-19 DIAGNOSIS — R278 Other lack of coordination: Secondary | ICD-10-CM | POA: Diagnosis present

## 2017-02-19 DIAGNOSIS — M6281 Muscle weakness (generalized): Secondary | ICD-10-CM | POA: Diagnosis present

## 2017-02-20 ENCOUNTER — Encounter: Payer: Self-pay | Admitting: Speech Pathology

## 2017-02-20 ENCOUNTER — Encounter: Payer: Medicare Other | Admitting: Occupational Therapy

## 2017-02-20 ENCOUNTER — Ambulatory Visit: Payer: Medicare Other | Admitting: Physical Therapy

## 2017-02-20 NOTE — Therapy (Signed)
[facility] [address] [place], [place], [address] Phone: [phone]   Fax:  [phone]  Speech Language Pathology Treatment  Patient Details  Name: Calvin AmberGeorge Stanford Pung Jr. MRN: [id number] Date of Birth: May 26, 1933 Referring Provider: Lorretta Harpowns, Jairon, MD  Encounter Date: 02/19/2017      End of Session - 02/20/17 1530    Visit Number 7   Number of Visits 17   Date for SLP Re-Evaluation 03/29/17   SLP Start Time 1500   SLP Stop Time  1558   SLP Time Calculation (min) 58 min   Activity Tolerance Patient tolerated treatment well      Past Medical History:  Diagnosis Date  . BPH (benign prostatic hyperplasia)   . Coronary artery disease   . Hypertension     Past Surgical History:  Procedure Laterality Date  . Intestinal Blockage    . SKIN CANCER EXCISION     x2  . TONSILLECTOMY      There were no vitals filed for this visit.      Subjective Assessment - 02/20/17 1523    Subjective "I flunked that"   Patient is accompained by: Family member   Currently in Pain? No/denies               ADULT SLP TREATMENT - 02/20/17 0001      General Information   Behavior/Cognition Alert;Cooperative;Pleasant mood     Treatment Provided   Treatment provided Cognitive-Linquistic     Pain Assessment   Pain Assessment No/denies pain     Cognitive-Linquistic Treatment   Treatment focused on Aphasia   Skilled Treatment PORTIONS OF THE [place]ST[place] APHASIA BATTERY-Revised: WORD FINDING: Complete simple verbal analogies (select appropriate response given 3 choices) with 70% accuracy but cannot state analogous relationship.  State item given definition with 85% accuracy.     Assessment / Recommendations / Plan   Plan Continue with current plan of care     Progression Toward Goals   Progression toward goals Progressing toward goals          SLP Education - 02/20/17 1529    Education provided Yes   Education  Details Try large print books   Person(s) Educated Patient;Spouse   Methods Explanation   Comprehension Verbalized understanding          SLP Short Term Goals - 02/14/17 1435      SLP SHORT TERM GOAL #1   Title pt will name average 10 items in concrete category in 60 seconds, over two sessions   Time 2   Period Weeks   Status On-going     SLP SHORT TERM GOAL #2   Title pt will demo compensations for expressive language in functional 7 minute mod complex conversation    Time 2   Period Weeks   Status On-going     SLP SHORT TERM GOAL #3   Title pt will undergo assessment of reading in first 3 therapy sessions   Status Achieved     SLP SHORT TERM GOAL #4   Title pt will read sentences of 10-12 words and demo comprehension at 90% success   Time 2   Period Weeks   Status On-going          SLP Long Term Goals - 02/14/17 1441      SLP LONG TERM GOAL #1   Title pt will use compensations for functional 10 minute mod-max complex conversation   Time 6   Period Weeks  Status On-going     SLP LONG TERM GOAL #2   Title  pt will demo [facility] tasks requiring memory compensations (meds, appointments, etc)   Time 6   Period Weeks   Status On-going     SLP LONG TERM GOAL #3   Title pt will demo functional reading comprehension in 3-4 sentence paragraphs   Time 6   Period Weeks   Status On-going          Plan - 02/20/17 1530    Clinical Impression Statement The patient has been receiving outpatient speech therapy in [place] and is transferring to our [place] office.  The patient is presenting with anomia and will benefit from continued ST for word finding.  He appears to be improving in self-cuing, but is not independent for word finding at this time.  The patient reports difficulty with reading and is no longer engaging in his preferred reading material.  We will continue to assess reading skills and address problem areas as needed.   Speech Therapy Frequency 2x / week    Duration Other (comment)   Treatment/Interventions Language facilitation;Internal/external aids;SLP instruction and feedback;Multimodal communcation approach;Functional tasks;Cognitive reorganization;Oral motor exercises;Cueing hierarchy;Patient/family education;Compensatory techniques   Potential to Achieve Goals Good   Potential Considerations Ability to learn/carryover information;Cooperation/participation level;Previous level of function;Severity of impairments;Family/community support   Consulted and Agree with Plan of Care Patient;Family member/caregiver   Family Member Consulted Spouse      Patient will benefit from skilled therapeutic intervention in order to improve the following deficits and impairments:   Aphasia    Problem List Patient Active Problem List   Diagnosis Date Noted  . Stenosis of right carotid artery 02/01/2017  . Acute embolic stroke ([facility]) 02/01/2017  . Bradycardia 11/10/2015  . Essential tremor 06/03/2015  . Dizziness 08/21/2012  . Syncope 04/10/2011  . CALF PAIN, LEFT 10/11/2010  . DYSPEPSIA 07/31/2010  . CHEST DISCOMFORT 07/31/2010  . Hyperlipidemia 05/16/2010  . HYPERTENSION, BENIGN 05/16/2010  . CAD, NATIVE VESSEL 05/16/2010   Calvin Primrose, MS/CCC- SLP  Leandrew Koyanagi 02/20/2017, 3:33 PM   [facility] MAIN [facility] SERVICES [address] [place], [place], [address] Phone: [phone]   Fax:  [phone]   Name: [name] [facility]. MRN: [id number] Date of Birth: March 17, 1933

## 2017-02-21 ENCOUNTER — Ambulatory Visit: Payer: Medicare Other | Admitting: Occupational Therapy

## 2017-02-21 ENCOUNTER — Ambulatory Visit: Payer: Medicare Other | Admitting: Physical Therapy

## 2017-02-21 ENCOUNTER — Encounter: Payer: Self-pay | Admitting: Physical Therapy

## 2017-02-21 DIAGNOSIS — R2681 Unsteadiness on feet: Secondary | ICD-10-CM

## 2017-02-21 DIAGNOSIS — M6281 Muscle weakness (generalized): Secondary | ICD-10-CM

## 2017-02-21 DIAGNOSIS — H5347 Heteronymous bilateral field defects: Secondary | ICD-10-CM

## 2017-02-21 DIAGNOSIS — R4701 Aphasia: Secondary | ICD-10-CM | POA: Diagnosis not present

## 2017-02-21 DIAGNOSIS — R278 Other lack of coordination: Secondary | ICD-10-CM

## 2017-02-21 NOTE — Therapy (Addendum)
[facility] MAIN [facility] SERVICES [address] [place], [place], [address] Phone: [phone]   Fax:  [phone]  Physical Therapy Treatment  Patient Details  Name: [name]. MRN: [id number] Date of Birth: 1933-02-27 Referring Provider: Lorretta Harp, MD  Encounter Date: 02/21/2017      PT End of Session - 02/21/17 1307    Visit Number 7   Number of Visits 17   Date for PT Re-Evaluation 03/22/17   Authorization Type Medicare Part A and B-G code and progress note every 10th visit   PT Start Time 1305   PT Stop Time 1345   PT Time Calculation (min) 40 min   Activity Tolerance Patient tolerated treatment well   Behavior During Therapy [facility] for tasks assessed/performed      Past Medical History:  Diagnosis Date  . BPH (benign prostatic hyperplasia)   . Coronary artery disease   . Hypertension     Past Surgical History:  Procedure Laterality Date  . Intestinal Blockage    . SKIN CANCER EXCISION     x2  . TONSILLECTOMY      There were no vitals filed for this visit.      Subjective Assessment - 02/21/17 1304    Subjective Patient is doing fine today, no new issues. Patient has back pain 3/10.   Patient is accompained by: Family member   Pertinent History Subcortical hemorrhage of left cerebral hemisphere (01/05/2017), parkinson's, [facility] Spotted Fever, Basal cell CA, squamous cell CA, DDD cervical, OA lumbar, HTN, BPH, vertigo, CAD, peripheral neuropathy   Limitations Standing;Walking;House hold activities   Patient Stated Goals Walk in community, workout at Club, treadmill, work in yard,    Currently in Pain? Yes   Pain Location Back   Pain Orientation Lower   Pain Descriptors / Indicators Aching   Pain Type Chronic pain   Pain Onset More than a month ago   Pain Frequency Intermittent   Aggravating Factors  up and walking and standing   Pain Relieving Factors sitting and lying down   Effect of Pain on  Daily Activities limits walking and standing   Multiple Pain Sites No      Therapeutic exercise and neuromuscular training: 1/2 foam flat side up and balance feet apart and feet together,x 2 mins  tandem standing on 1/2 foam  needs  CGA  standing hip abd with YTB x 20   side stepping left and right in parallel bars 10 feet x 3 step ups from floor to 6 inch stool x 20 bilateral marching in parallel bars x 20 big fwd and bwds Tilt board fwd/bwd, side to side left and right x 5 min lg rocker board balancing in the mid point x 2 mins Rocker board with ball catching to himself x 20 Purple foam feet together with head turns, catching ball to himself  Leg press 100 lbs x 20 x 3 Patient needs occasional verbal cueing to improve posture and cueing to correctly perform exercises slowly, Patient needs CGa and cues for posture correction, head position and technique. Left ankle instability on foam with eversion.                           PT Education - 02/21/17 1307    Education provided Yes   Education Details heat for LBP   Person(s) Educated Patient   Methods Explanation   Comprehension Verbalized understanding  PT Short Term Goals - 02/14/17 1629      PT SHORT TERM GOAL #1   Title Patient & wife demonstrate understanding of initial HEP. (Target Date: 02/22/2017)   Time 4   Period Weeks   Status On-going     PT SHORT TERM GOAL #2   Title Cognitive Timed Up-Go <12 seconds.  (Target Date: 02/22/2017)   Time 4   Period Weeks   Status On-going     PT SHORT TERM GOAL #3   Title Patient ambulates with head turns without balance loss & maintaining path.  (Target Date: 02/22/2017)   Time 4   Period Weeks   Status On-going     PT SHORT TERM GOAL #4   Title Berg Balance >45/56 to indicate lower fall risk.   (Target Date: 02/22/2017)   Time 4   Period Weeks   Status On-going           PT Long Term Goals - 02/04/17 2030      PT LONG TERM GOAL #1    Title Patient demonstrates with wife's cueing ongoing exercise plan. (Target Date: 03/22/2017)   Time 8   Period Weeks   Status On-going     PT LONG TERM GOAL #2   Title Berg Balance >/= 50/56 to indicate lower fall risk. (Target Date: 03/22/2017)   Time 8   Period Weeks   Status On-going     PT LONG TERM GOAL #3   Title Cognitive Timed Up-Go increases <25% from standard TUG to indicate lower fall risk.  (Target Date: 03/22/2017)   Time 8   Period Weeks   Status On-going     PT LONG TERM GOAL #4   Title Functional Gait Assessment >20/30 indicating lower fall risk.  (Target Date: 03/22/2017)   Time 8   Period Weeks   Status On-going     PT LONG TERM GOAL #5   Title MiniBESTest >14/28 indicating lower fall risk.  (Target Date: 03/22/2017)   Time 8   Period Weeks   Status On-going     PT LONG TERM GOAL #6   Title Patient reports low back pain with standing & walking </= 3/10 (Target Date: 03/22/2017)   Time 8   Period Weeks   Status On-going               Plan - 02/21/17 1308    Clinical Impression Statement Patient was instructed in high level dynamic standing balance activies with weight shifting challenges and decreased somatosensory input. Patient needs CGA for single leg activities. Patient will benefit from continued skilled PT to improve balance and gait on unven surfaces.    Clinical Impairments Affecting Rehab Potential Subcortical hemorrhage of left cerebral hemisphere (01/05/2017), parkinson's, [facility] Spotted Fever, Basal cell CA, squamous cell CA, DDD cervical, OA lumbar, HTN, BPH, vertigo, CAD, peripheral neuropathy   PT Frequency 2x / week   PT Duration 8 weeks   PT Treatment/Interventions ADLs/Self Care Home Management;Gait training;Stair training;Functional mobility training;Therapeutic activities;Therapeutic exercise;Balance training;Neuromuscular re-education;Patient/family education;Ultrasound;Moist Heat;Manual techniques   PT Next Visit Plan Work on  walking speed, endurance on treadmill, gait outside and on compliant surfaces, PWR exercises   Consulted and Agree with Plan of Care Patient      Patient will benefit from skilled therapeutic intervention in order to improve the following deficits and impairments:  Abnormal gait, Decreased activity tolerance, Decreased balance, Decreased mobility, Decreased strength, Pain  Visit Diagnosis: Other lack of coordination  Unsteadiness on feet  Muscle weakness (generalized)     Problem List Patient Active Problem List   Diagnosis Date Noted  . Stenosis of right carotid artery 02/01/2017  . Acute embolic stroke (HCC) 02/01/2017  . Bradycardia 11/10/2015  . Essential tremor 06/03/2015  . Dizziness 08/21/2012  . Syncope 04/10/2011  . CALF PAIN, LEFT 10/11/2010  . DYSPEPSIA 07/31/2010  . CHEST DISCOMFORT 07/31/2010  . Hyperlipidemia 05/16/2010  . HYPERTENSION, BENIGN 05/16/2010  . CAD, NATIVE VESSEL 05/16/2010   Ezekiel InaKristine S Donell Sliwinski, PT, DPT [place] 02/21/2017, 1:12 PM  [facility] [address] [place], [place], [address] Phone: [phone]   Fax:  [phone]  Name: Calvin ShortsGeorge Stanford [facility] MRN: [id number] Date of Birth: 1933-06-11

## 2017-02-21 NOTE — Patient Instructions (Signed)
OT TREATMENT     Neuro muscular re-education:  Therapeutic Exercise:  Selfcare:  Visual attention was assessed using the BiVABA. Search Strategies for near space, extrapersonal space, and attention to detail were assessed in preparation for ADL, and IADL tasks, and to determine how visual limitations may be affecting ADL, and IADL functioning. Search strategies for near space structured visual array simple single letter search with 78% accuracy, 9 omissions on right, left, and in the center in . & 8 sec. Pt. Used symmetrical horizontal rectilinear search strategy, crowded single letter search with 100% accuracy in 5 min. &42 sec. With symmetrical horzontal rectilinear search strategies. Word search with 83% accuracy 5 omissions,in 4 min.& 20 sec., structured complex circles search with 100% correct responses, however had 7 additional misidentifications. Pt. Utilized symmetrical horizontal left to right search strategies in 2 min. & 27 sec., Unstructured Visual Array: Random plain simple circles: 90% with 2 double numbers identified with 90% correct, Random crowded plain circles: with 100% correct in 1 min &7 sec., Random complex circles search with 95% correct, 2 omissions on the right, and 1 misidentifications in 2 min. &10 sec. asymetrical right to left pattern. Attention to detail: Pt. was able to complete telephone copy with 60% correct responses in 1 min & 57 sec.,  6/10 correct, 11 digits were misidentified. Design copy: House in 2 min, Flower in 1 min. & 52 sec., Clock in 46 sec. With all details present. Search strategies for External space: Pt. Was able to accurately identify 10/10 numbers using a left to right horizontal rectilinear search pattern.  Manual Therapy:

## 2017-02-21 NOTE — Therapy (Signed)
[place] MAIN [facility] SERVICES [address] [place], [place], [address] Phone: [phone]   Fax:  [phone]  Occupational Therapy Treatment  Patient Details  Name: [name]. MRN: [id number] Date of Birth: 1933/09/17 Referring Provider: Dr. Rea College  Encounter Date: 02/21/2017      OT End of Session - 02/21/17 1644    Visit Number 7   Number of Visits 16   Date for OT Re-Evaluation 03/21/17   Authorization Type medicare G-code 7   OT Start Time 1345   OT Stop Time 1430   OT Time Calculation (min) 45 min   Activity Tolerance Patient tolerated treatment well   Behavior During Therapy [facility] for tasks assessed/performed      Past Medical History:  Diagnosis Date  . BPH (benign prostatic hyperplasia)   . Coronary artery disease   . Hypertension     Past Surgical History:  Procedure Laterality Date  . Intestinal Blockage    . SKIN CANCER EXCISION     x2  . TONSILLECTOMY      There were no vitals filed for this visit.      Subjective Assessment - 02/21/17 1641    Subjective  Pt. has been receiving rehab services at [facility] in [place].  Pt. is being transferred to this clinic, as it is closer to home.    Patient is accompained by: Family member   Pertinent History see epic.  L CVA, R hemiopsia, Parkinson's, essential tremor   Patient Stated Goals I want to get well and be perfect again.  I hate these questions (pt unable to give specific answer)   Currently in Pain? No/denies                      OT Treatments/Exercises (OP) - 02/21/17 1716      Visual/Perceptual Exercises   Other Exercises Visual attention was assessed using the BiVABA. Search Strategies for near space, extrapersonal space, and attention to detail were assessed in preparation for ADL, and IADL tasks, and to determine how visual limitations may be affecting ADL, and IADL functioning. Search  strategies for near space structured visual array simple single letter search with 78% accuracy, 9 omissions on right, left, and in the center in 73mn. & 8 sec. Pt. Used symmetrical horizontal rectilinear search strategy, crowded single letter search with 100% accuracy in 5 min. &42 sec. With symmetrical horzontal rectilinear search strategies. Word search with 83% accuracy 5 omissions,in 4 min.& 20 sec., structured complex circles search with 100% correct responses, however had 7 additional misidentifications. Pt. Utilized symmetrical horizontal left to right search strategies in 2 min. & 27 sec., Unstructured Visual Array: Random plain simple circles: 90% with 2 double numbers identified with 90% correct, Random crowded plain circles: with 100% correct in 1 min &7 sec., Random complex circles search with 95% correct, 2 omissions on the right, and 1 misidentifications in 2 min. &10 sec. asymetrical right to left pattern. Attention to detail: Pt. was able to complete telephone copy with 60% correct responses in 1 min & 57 sec.,  6/10 correct, 11 digits were misidentified. Design copy: House in 2 min, Flower in 1 min. & 52 sec., Clock in 46 sec. With all details present. Search strategies for External space: Pt. Was able to accurately identify 10/10 numbers using a left to right horizontal rectilinear search pattern.  OT Education - 02/21/17 5852    Education provided Yes   Education Details Visual scanning, and search techniques.   Person(s) Educated Patient   Methods Explanation   Comprehension Verbalized understanding          OT Short Term Goals - 02/14/17 1457      OT SHORT TERM GOAL #1   Title Pt and wife will be mod I with HEP - 02/21/2017   Time 4   Period Weeks   Status On-going  needs reinforcement of coordination HEP     OT SHORT TERM GOAL #2   Title Pt will be mod I with shower transfers   Status Achieved  met per pt report     OT SHORT TERM GOAL #3    Title Pt will be mod I with bathing   Status Achieved     OT SHORT TERM GOAL #4   Title Pt will complete table top scanning activity (not lanaguage or letter based) with 75% accuracy   Status Achieved  met for copying small peg design with min v.c.     OT SHORT TERM GOAL #5   Title Pt will attend to familiar task in busy environment with no more than min vc's.     Status Achieved           OT Long Term Goals - 02/18/17 1210      OT LONG TERM GOAL #1   Title Pt and wife will be mod I with ugraded HEP prn - 03/21/2017   Time 8   Period Weeks   Status On-going     OT LONG TERM GOAL #2   Title Pt will demonstrate improved coordinaton by decreasing time on 9 hole peg test by 4 seconds (baseline=34.15)   Time 8   Period Weeks   Status Achieved  02/18/17:  25.81sec     OT LONG TERM GOAL #3   Title Pt will be able to print/write name legibly AE prn- (revised goal) Pt will demonstrate ability to write a 4 sentence paragraph with 100% legibility, and no significant decr. in letter size using AE PRN.   Time 8   Period Weeks   Status Achieved  inital goal met, updated goal met 02/14/17     OT LONG TERM GOAL #4   Title Pt will demonstate ability to maintain balance on outdoor uneven surfaces in preparation for returning to simple yard work.    Time 8   Status On-going     OT LONG TERM GOAL #5   Title Pt will require no more than 2 vc's to recall information from therapy session   Time 8   Period Weeks   Status On-going     OT LONG TERM GOAL #6   Title Pt will complete table top scanning activity (not language based) with 90% accuracy   Status On-going     OT LONG TERM GOAL #7   Title Pt will complete environmental scanning task with at least 75% accuracy.  Revised to 90% accuracy 02/14/17   Time 8   Period Weeks   Status On-going  60%, 9/15 located first pass, 2 more trips to locate remaining items.  02/14/17  80% today in min distracting environment     OT [place]  #8   Title Pt will perform simple divided attention with environmental scanning for improved safety for ambulation/in prep for driving with at least 75% accuracy.   Period Weeks   Status New  Plan - 02/21/17 1646    Clinical Impression Statement Pt. has transferred to this clinic for treatment. Pt. was receiving rehab services at [facility] in [place]. Pt. presents with visual perceptual impairments, and limitations with visual scanning, and visual search strategies which hinder his ability to complete ADL, and IADL tasks.   Rehab Potential Good   OT Frequency 2x / week   OT Duration 8 weeks   OT Treatment/Interventions Self-care/ADL training;Electrical Stimulation;Therapeutic exercise;Neuromuscular education;DME and/or AE instruction;Energy conservation;Therapist, nutritional;Therapeutic activities;Cognitive remediation/compensation;Visual/perceptual remediation/compensation;Patient/family education;Balance training   Consulted and Agree with Plan of Care Patient      Patient will benefit from skilled therapeutic intervention in order to improve the following deficits and impairments:  Decreased balance, Decreased cognition, Decreased coordination, Decreased safety awareness, Decreased mobility, Decreased knowledge of use of DME, Impaired UE functional use, Impaired vision/preception  Visit Diagnosis: Hemianopsia  Other lack of coordination    Problem List Patient Active Problem List   Diagnosis Date Noted  . Stenosis of right carotid artery 02/01/2017  . Acute embolic stroke ([place]) 67/34/1937  . Bradycardia 11/10/2015  . Essential tremor 06/03/2015  . Dizziness 08/21/2012  . Syncope 04/10/2011  . CALF PAIN, LEFT 10/11/2010  . DYSPEPSIA 07/31/2010  . CHEST DISCOMFORT 07/31/2010  . Hyperlipidemia 05/16/2010  . HYPERTENSION, BENIGN 05/16/2010  . CAD, NATIVE VESSEL 05/16/2010    Harrel Carina, MS,  OTR/L 02/21/2017, 5:17 PM  [place] MAIN [facility] SERVICES [address] [place], [place], [address] Phone: [phone]   Fax:  [phone]  Name: Cordelle Dahmen [facility]. MRN: [id number] Date of Birth: Aug 31, 1933

## 2017-02-22 ENCOUNTER — Ambulatory Visit: Payer: Medicare Other | Admitting: Physical Therapy

## 2017-02-25 ENCOUNTER — Ambulatory Visit: Payer: Medicare Other | Admitting: Physical Therapy

## 2017-02-25 ENCOUNTER — Encounter: Payer: Medicare Other | Admitting: Occupational Therapy

## 2017-02-26 ENCOUNTER — Ambulatory Visit: Payer: Medicare Other | Admitting: Occupational Therapy

## 2017-02-26 ENCOUNTER — Encounter: Payer: Medicare Other | Admitting: Speech Pathology

## 2017-02-26 ENCOUNTER — Ambulatory Visit: Payer: Medicare Other | Admitting: Physical Therapy

## 2017-02-26 ENCOUNTER — Encounter: Payer: Self-pay | Admitting: Physical Therapy

## 2017-02-26 ENCOUNTER — Encounter: Payer: Self-pay | Admitting: Speech Pathology

## 2017-02-26 ENCOUNTER — Ambulatory Visit: Payer: Medicare Other | Admitting: Speech Pathology

## 2017-02-26 DIAGNOSIS — R2681 Unsteadiness on feet: Secondary | ICD-10-CM

## 2017-02-26 DIAGNOSIS — R278 Other lack of coordination: Secondary | ICD-10-CM

## 2017-02-26 DIAGNOSIS — H5347 Heteronymous bilateral field defects: Secondary | ICD-10-CM

## 2017-02-26 DIAGNOSIS — R4701 Aphasia: Secondary | ICD-10-CM

## 2017-02-26 DIAGNOSIS — M6281 Muscle weakness (generalized): Secondary | ICD-10-CM

## 2017-02-26 NOTE — Therapy (Signed)
[place] [facility] [address] [place], [place], [address] Phone: [phone]   Fax:  [phone]  Speech Language Pathology Treatment  Patient Details  Name: Calvin AmberGeorge Stanford Stamos Jr. MRN: [id number] Date of Birth: 28-Oct-1933 Referring Provider: Lorretta Harpowns, Jairon, MD  Encounter Date: 02/26/2017      End of Session - 02/26/17 1650    Visit Number 8   Number of Visits 17   Date for SLP Re-Evaluation 03/29/17   SLP Start Time 1300   SLP Stop Time  1356   SLP Time Calculation (min) 56 min   Activity Tolerance Patient tolerated treatment well      Past Medical History:  Diagnosis Date  . BPH (benign prostatic hyperplasia)   . Coronary artery disease   . Hypertension     Past Surgical History:  Procedure Laterality Date  . Intestinal Blockage    . SKIN CANCER EXCISION     x2  . TONSILLECTOMY      There were no vitals filed for this visit.      Subjective Assessment - 02/26/17 1649    Subjective "I flunked that"   Currently in Pain? No/denies               ADULT SLP TREATMENT - 02/26/17 0001      General Information   Behavior/Cognition Alert;Cooperative;Pleasant mood     Treatment Provided   Treatment provided Cognitive-Linquistic     Pain Assessment   Pain Assessment No/denies pain     Cognitive-Linquistic Treatment   Treatment focused on Aphasia   Skilled Treatment PORTIONS OF THE [place]ST[place] APHASIA BATTERY-Revised: WORD FINDING: Complete simple verbal analogies (select appropriate response given 3 choices) with 70% accuracy but cannot state analogous relationship.  State item given definition with 85% accuracy.     Assessment / Recommendations / Plan   Plan Continue with current plan of care          SLP Education - 02/26/17 1649    Education provided Yes   Education Details Word finding strategies   Person(s) Educated Patient   Methods Explanation   Comprehension Verbalized understanding           SLP Short Term Goals - 02/14/17 1435      SLP SHORT TERM GOAL #1   Title pt will name average 10 items in concrete category in 60 seconds, over two sessions   Time 2   Period Weeks   Status On-going     SLP SHORT TERM GOAL #2   Title pt will demo compensations for expressive language in functional 7 minute mod complex conversation    Time 2   Period Weeks   Status On-going     SLP SHORT TERM GOAL #3   Title pt will undergo assessment of reading in first 3 therapy sessions   Status Achieved     SLP SHORT TERM GOAL #4   Title pt will read sentences of 10-12 words and demo comprehension at 90% success   Time 2   Period Weeks   Status On-going          SLP Long Term Goals - 02/14/17 1441      SLP LONG TERM GOAL #1   Title pt will use compensations for functional 10 minute mod-max complex conversation   Time 6   Period Weeks   Status On-going     SLP LONG TERM GOAL #2   Title  pt will demo [facility] tasks requiring memory  compensations (meds, appointments, etc)   Time 6   Period Weeks   Status On-going     SLP LONG TERM GOAL #3   Title pt will demo functional reading comprehension in 3-4 sentence paragraphs   Time 6   Period Weeks   Status On-going          Plan - 02/26/17 1650    Clinical Impression Statement The patient is improving in reading by reading newspapers and personal reading.  His reading fluency is good.  The patient is completing simple word finding tasks, but becomes frustrated when he cannot rapidly find the word.   Speech Therapy Frequency 2x / week   Duration Other (comment)   Treatment/Interventions Language facilitation;Internal/external aids;SLP instruction and feedback;Multimodal communcation approach;Functional tasks;Cognitive reorganization;Oral motor exercises;Cueing hierarchy;Patient/family education;Compensatory techniques   Potential to Achieve Goals Good   Potential Considerations Ability to learn/carryover  information;Cooperation/participation level;Previous level of function;Severity of impairments;Family/community support   SLP Home Exercise Plan Word finding worksheets   Consulted and Agree with Plan of Care Patient      Patient will benefit from skilled therapeutic intervention in order to improve the following deficits and impairments:   Aphasia    Problem List Patient Active Problem List   Diagnosis Date Noted  . Stenosis of right carotid artery 02/01/2017  . Acute embolic stroke ([facility]) 02/01/2017  . Bradycardia 11/10/2015  . Essential tremor 06/03/2015  . Dizziness 08/21/2012  . Syncope 04/10/2011  . CALF PAIN, LEFT 10/11/2010  . DYSPEPSIA 07/31/2010  . CHEST DISCOMFORT 07/31/2010  . Hyperlipidemia 05/16/2010  . HYPERTENSION, BENIGN 05/16/2010  . CAD, NATIVE VESSEL 05/16/2010   [name], MS/CCC- SLP  Leandrew Koyanagi 02/26/2017, 4:55 PM  [place] [facility] MAIN [facility] SERVICES [address] [place], [place], [address] Phone: [phone]   Fax:  [phone]   Name: Calvin [facility] MRN: [id number] Date of Birth: 04/16/1933

## 2017-02-26 NOTE — Therapy (Signed)
[place] MAIN [facility] SERVICES [address] [place], [place], [address] Phone: [phone]   Fax:  [phone]  Physical Therapy Treatment  Patient Details  Name: [name]. MRN: [id number] Date of Birth: 03-14-1933 Referring Provider: Launa Grill, MD  Encounter Date: 02/26/2017      PT End of Session - 02/26/17 1422    Visit Number 8   Number of Visits 17   Date for PT Re-Evaluation 03/22/17   Authorization Type 8/10   PT Start Time 1400   PT Stop Time 1440   PT Time Calculation (min) 40 min   Activity Tolerance Patient tolerated treatment well   Behavior During Therapy [facility] for tasks assessed/performed      Past Medical History:  Diagnosis Date  . BPH (benign prostatic hyperplasia)   . Coronary artery disease   . Hypertension     Past Surgical History:  Procedure Laterality Date  . Intestinal Blockage    . SKIN CANCER EXCISION     x2  . TONSILLECTOMY      There were no vitals filed for this visit.      Subjective Assessment - 02/26/17 1405    Subjective (P)  Patient is doing fine today, no new issues. Patient has back pain 4/10.   Patient is accompained by: (P)  Family member   Pertinent History (P)  Subcortical hemorrhage of left cerebral hemisphere (01/05/2017), parkinson's, [facility] Spotted Fever, Basal cell CA, squamous cell CA, DDD cervical, OA lumbar, HTN, BPH, vertigo, CAD, peripheral neuropathy   Limitations (P)  Standing;Walking;House hold activities   Patient Stated Goals (P)  Walk in community, workout at [employer], treadmill, work in yard,    Pain Score (P)  4    Pain Location (P)  Back   Pain Orientation (P)  Lower   Pain Onset (P)  More than a month ago   Multiple Pain Sites (P)  No      Neuromuscular training: Matrix side stepping/ fwd stepping/bwds stepping x 5 with 13. 5 lbs TM walking with UE support and cues for correct posture and attempting no UE assist x 10 minutes elevation  1 Outcome measures assessed including cog TUG 12 sec , Berg balance test 50/56 Patient reaches several STG and plan of care was reviewed.  Patient needs CGA and min assist during matrix balance exercise                           PT Education - 02/26/17 1422    Education provided Yes   Education Details safety with mobility   Person(s) Educated Patient   Methods Explanation   Comprehension Verbalized understanding          PT Short Term Goals - 02/26/17 1424      PT SHORT TERM GOAL #1   Title Patient & wife demonstrate understanding of initial HEP. (Target Date: 02/22/2017)   Time 4   Period Weeks   Status Achieved     PT SHORT TERM GOAL #2   Title Cognitive Timed Up-Go <12 seconds.  (Target Date: 02/22/2017)   Baseline 12 sec   Time 4   Period Weeks   Status Achieved     PT SHORT TERM GOAL #3   Title Patient ambulates with head turns without balance loss & maintaining path.  (Target Date: 02/22/2017)   Baseline achieved   Time 4   Period Weeks   Status Achieved  PT SHORT TERM GOAL #4   Title Berg Balance >45/56 to indicate lower fall risk.   (Target Date: 02/22/2017)   Baseline 50/56   Time 4   Status Partially Met           PT Long Term Goals - 03/27/2017 1442      PT LONG TERM GOAL #1   Title Patient demonstrates with wife's cueing ongoing exercise plan. (Target Date: 03/22/2017)   Time 8   Period Weeks   Status On-going     PT LONG TERM GOAL #2   Title Berg Balance >/= 50/56 to indicate lower fall risk. (Target Date: 03/22/2017)   Baseline 50/56   Period Weeks   Status Achieved     PT LONG TERM GOAL #3   Title Cognitive Timed Up-Go increases <25% from standard TUG to indicate lower fall risk.  (Target Date: 03/22/2017)   Baseline 12 sec   Period Weeks   Status Achieved     PT LONG TERM GOAL #4   Title Functional Gait Assessment >20/30 indicating lower fall risk.  (Target Date: 03/22/2017)   Baseline NT   Period Weeks   Status  On-going     PT LONG TERM GOAL #5   Title MiniBESTest >14/28 indicating lower fall risk.  (Target Date: 03/22/2017)   Baseline NT   Time 8   Period Weeks   Status On-going     PT LONG TERM GOAL #6   Title Patient reports low back pain with standing & walking </= 3/10 (Target Date: 03/22/2017)   Baseline 3/10   Period Weeks   Status Achieved               Plan - 03-27-17 1423    Clinical Impression Statement Patient required min verbal cueing during matrix balance exercise to correct posture and form. Patient demonstrates ability to perform strengthening exercises and dynamic standing balance exercises with no increase in pain but with moderate fatigue.Patient reached 2 STG's with cog TUG and Berg balance.  Patient will continue to benefit from continued skilled therapy in order to improve dynamic standing balance and increase strength in order to decrease falls   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), parkinson's, [facility] Spotted Fever, Basal cell CA, squamous cell CA, DDD cervical, OA lumbar, HTN, BPH, vertigo, CAD, peripheral neuropathy   PT Frequency 2x / week   PT Duration 8 weeks   PT Treatment/Interventions ADLs/Self Care Home Management;Gait training;Stair training;Functional mobility training;Therapeutic activities;Therapeutic exercise;Balance training;Neuromuscular re-education;Patient/family education;Ultrasound;Moist Heat;Manual techniques   PT Next Visit Plan Work on walking speed, endurance on treadmill, gait outside and on compliant surfaces, PWR exercises   Consulted and Agree with Plan of Care Patient      Patient will benefit from skilled therapeutic intervention in order to improve the following deficits and impairments:  Abnormal gait, Decreased activity tolerance, Decreased balance, Decreased mobility, Decreased strength, Pain  Visit  Diagnosis: Hemianopsia  Other lack of coordination  Unsteadiness on feet  Muscle weakness (generalized)       G-Codes - Mar 27, 2017 1444    Functional Assessment Tool Used (Outpatient Only) Cog TUg, BERG,    Functional Limitation Mobility: Walking and moving around   Mobility: Walking and Moving Around Current Status (X9147) At least 40 percent but less than 60 percent impaired, limited or restricted   Mobility: Walking and Moving Around Goal Status (W2956) At least 40 percent but less than 60 percent  impaired, limited or restricted      Problem List Patient Active Problem List   Diagnosis Date Noted  . Stenosis of right carotid artery 02/01/2017  . Acute embolic stroke ([place]) 31/43/8887  . Bradycardia 11/10/2015  . Essential tremor 06/03/2015  . Dizziness 08/21/2012  . Syncope 04/10/2011  . CALF PAIN, LEFT 10/11/2010  . DYSPEPSIA 07/31/2010  . CHEST DISCOMFORT 07/31/2010  . Hyperlipidemia 05/16/2010  . HYPERTENSION, BENIGN 05/16/2010  . CAD, NATIVE VESSEL 05/16/2010   Alanson Puls, PT, DPT [place], [place] 02/26/2017, 2:50 PM  [place] MAIN [facility] SERVICES [address] [place], [place], [address] Phone: [phone]   Fax:  [phone]  Name: Aqib Lough [facility]. MRN: [id number] Date of Birth: 08-15-1933

## 2017-02-26 NOTE — Therapy (Addendum)
[place] MAIN [facility] SERVICES [address] [place], [place], [address] Phone: [phone]   Fax:  [phone]  Occupational Therapy Treatment  Patient Details  Name: [name]. MRN: [id number] Date of Birth: [birth year] Referring Provider: Dr. Rea College  Encounter Date: 02/26/2017      OT End of Session - 02/26/17 1726    Visit Number 8   Number of Visits 16   Date for OT Re-Evaluation 03/21/17   Authorization Type medicare G-code 8   Authorization Time Period 60 days   Authorization - Visit Number 6   Authorization - Number of Visits 10   OT Start Time 3474   OT Stop Time 1430   OT Time Calculation (min) 45 min   Activity Tolerance Patient tolerated treatment well   Behavior During Therapy [facility] for tasks assessed/performed      Past Medical History:  Diagnosis Date  . BPH (benign prostatic hyperplasia)   . Coronary artery disease   . Hypertension     Past Surgical History:  Procedure Laterality Date  . Intestinal Blockage    . SKIN CANCER EXCISION     x2  . TONSILLECTOMY      There were no vitals filed for this visit.      Subjective Assessment - 02/26/17 1724    Subjective  Pt. reports he plans to be driving before he is through with therapy.   Patient is accompained by: Family member   Pertinent History see epic.  L CVA, R hemiopsia, Parkinson's, essential tremor   Patient Stated Goals I want to get well and be perfect again.  I hate these questions (pt unable to give specific answer)   Currently in Pain? No/denies     OT Treatment  Self-Care:   Pt. Worked on visual scan course in the hallway. Pt. Omitted items on the right, and left. Pt. Was able to keep a steady pace. Pt. Required extensive cues, and increased time to complete  Visual scanning in the kitchen. Pt. education was provided about visual compensatory strategies. Pt. Missed items frequently to the right, and left. Pt. Worked on copying  telephone numbers. Pt. Was able to complete 11/13 accurately. Pt. Worked on saccades left to right, at the tabletop on a standard sheet of paper with columns 6" apart. Pt. Randomly missed items on the bottom right and left columns. Pt. frequently missed multiple items when the columns were placed shoulder width apart. Pt. Also worked on reading aloud letters underlined in a letter search with page positioned upright in front of pt. Pt. Missed 7 letters. Pt. Worked on visual scanning strategies using the instructo board positioned in the center, and to the right.                         OT Education - 02/26/17 1725    Education provided Yes   Education Details Visual scanning, and visual search strategies.   Person(s) Educated Patient   Methods Explanation   Comprehension Verbalized understanding          OT Short Term Goals - 02/14/17 1457      OT SHORT TERM GOAL #1   Title Pt and wife will be mod I with HEP - 02/21/2017   Time 4   Period Weeks   Status On-going  needs reinforcement of coordination HEP     OT SHORT TERM GOAL #2   Title Pt will be mod I  with shower transfers   Status Achieved  met per pt report     OT SHORT TERM GOAL #3   Title Pt will be mod I with bathing   Status Achieved     OT SHORT TERM GOAL #4   Title Pt will complete table top scanning activity (not lanaguage or letter based) with 75% accuracy   Status Achieved  met for copying small peg design with min v.c.     OT SHORT TERM GOAL #5   Title Pt will attend to familiar task in busy environment with no more than min vc's.     Status Achieved           OT Long Term Goals - 02/18/17 1210      OT LONG TERM GOAL #1   Title Pt and wife will be mod I with ugraded HEP prn - 03/21/2017   Time 8   Period Weeks   Status On-going     OT LONG TERM GOAL #2   Title Pt will demonstrate improved coordinaton by decreasing time on 9 hole peg test by 4 seconds (baseline=34.15)   Time 8    Period Weeks   Status Achieved  02/18/17:  25.81sec     OT LONG TERM GOAL #3   Title Pt will be able to print/write name legibly AE prn- (revised goal) Pt will demonstrate ability to write a 4 sentence paragraph with 100% legibility, and no significant decr. in letter size using AE PRN.   Time 8   Period Weeks   Status Achieved  inital goal met, updated goal met 02/14/17     OT LONG TERM GOAL #4   Title Pt will demonstate ability to maintain balance on outdoor uneven surfaces in preparation for returning to simple yard work.    Time 8   Status On-going     OT LONG TERM GOAL #5   Title Pt will require no more than 2 vc's to recall information from therapy session   Time 8   Period Weeks   Status On-going     OT LONG TERM GOAL #6   Title Pt will complete table top scanning activity (not language based) with 90% accuracy   Status On-going     OT LONG TERM GOAL #7   Title Pt will complete environmental scanning task with at least 75% accuracy.  Revised to 90% accuracy 02/14/17   Time 8   Period Weeks   Status On-going  60%, 9/15 located first pass, 2 more trips to locate remaining items.  02/14/17  80% today in min distracting environment     OT [place] #8   Title Pt will perform simple divided attention with environmental scanning for improved safety for ambulation/in prep for driving with at least 75% accuracy.   Period Weeks   Status New               Plan - 02/26/17 1727    Clinical Impression Statement Pt. was able to scan  right to left in the hallway on the scan course with random omissions of items on the right, and left. Pt. required extensive cues for visual scanning for items in the kitchen. Pt. scanned past items several times missing items frequently, and randomly on the right, and left. Pt. continues to work on improving visual scanning, and visual search strategies for items in near space, and extrpersonal space, and utilizing visual compensatory  strategies during ADLs, and IADLs.   Rehab  Potential Good   OT Frequency 2x / week   OT Duration 8 weeks   OT Treatment/Interventions Self-care/ADL training;Electrical Stimulation;Therapeutic exercise;Neuromuscular education;DME and/or AE instruction;Energy conservation;Therapist, nutritional;Therapeutic activities;Cognitive remediation/compensation;Visual/perceptual remediation/compensation;Patient/family education;Balance training   Consulted and Agree with Plan of Care Patient   Family Member Consulted wife [name]      Patient will benefit from skilled therapeutic intervention in order to improve the following deficits and impairments:  Decreased balance, Decreased cognition, Decreased coordination, Decreased safety awareness, Decreased mobility, Decreased knowledge of use of DME, Impaired UE functional use, Impaired vision/preception  Visit Diagnosis: Hemianopsia    Problem List Patient Active Problem List   Diagnosis Date Noted  . Stenosis of right carotid artery 02/01/2017  . Acute embolic stroke ([place]) 87/86/7672  . Bradycardia 11/10/2015  . Essential tremor 06/03/2015  . Dizziness 08/21/2012  . Syncope 04/10/2011  . CALF PAIN, LEFT 10/11/2010  . DYSPEPSIA 07/31/2010  . CHEST DISCOMFORT 07/31/2010  . Hyperlipidemia 05/16/2010  . HYPERTENSION, BENIGN 05/16/2010  . CAD, NATIVE VESSEL 05/16/2010    Harrel Carina, MS, OTR/L 02/26/2017, 5:50 PM  [place] MAIN [facility] SERVICES [address] [place], [place], [address] Phone: [phone]   Fax:  [phone]  Name: [name] [facility]. MRN: [id number] Date of Birth: [birth year]

## 2017-02-26 NOTE — Patient Instructions (Signed)
OT TREATMENT     Neuro muscular re-education:  Therapeutic Exercise:  Selfcare:  Pt. Worked on visual scan course in the hallway. Pt. Omitted items on the right, and left. Pt. Was able to keep a steady pace. Pt. Required extensive cues, and increased time to complete  Visual scanning in the kitchen. Pt. education was provided about visual compensatory strategies. Pt. Missed items frequently to the right, and left. Pt. Worked on copying telephone numbers. Pt. Was able to complete 11/13 accurately. Pt. Worked on saccades left to right, at the tabletop on a standard sheet of paper with columns 6" apart. Pt. Randomly missed items on the bottom right and left columns. Pt. frequently missed multiple items when the columns were placed shoulder width apart. Pt. Also worked on reading aloud letters underlined in a letter search with page positioned upright in front of pt. Pt. Missed 7 letters. Pt. Worked on visual scanning strategies using the instructo board positioned in the center, and to the right.  Manual Therapy:

## 2017-02-27 ENCOUNTER — Ambulatory Visit: Payer: Medicare Other | Admitting: Physical Therapy

## 2017-02-27 ENCOUNTER — Encounter: Payer: Medicare Other | Admitting: Occupational Therapy

## 2017-02-28 ENCOUNTER — Ambulatory Visit: Payer: Medicare Other | Admitting: Physical Therapy

## 2017-02-28 ENCOUNTER — Ambulatory Visit: Payer: Medicare Other | Admitting: Speech Pathology

## 2017-02-28 ENCOUNTER — Encounter: Payer: Self-pay | Admitting: Physical Therapy

## 2017-02-28 ENCOUNTER — Encounter: Payer: Self-pay | Admitting: Speech Pathology

## 2017-02-28 DIAGNOSIS — H5347 Heteronymous bilateral field defects: Secondary | ICD-10-CM

## 2017-02-28 DIAGNOSIS — M6281 Muscle weakness (generalized): Secondary | ICD-10-CM

## 2017-02-28 DIAGNOSIS — R278 Other lack of coordination: Secondary | ICD-10-CM

## 2017-02-28 DIAGNOSIS — R4701 Aphasia: Secondary | ICD-10-CM

## 2017-02-28 DIAGNOSIS — R2681 Unsteadiness on feet: Secondary | ICD-10-CM

## 2017-02-28 NOTE — Therapy (Signed)
[place] MAIN [facility] SERVICES [address] [place], [place], [address] Phone: [phone]   Fax:  [phone]  Physical Therapy Treatment  Patient Details  Name: [name]. MRN: [id number] Date of Birth: [birth year] Referring Provider: Launa Grill, MD  Encounter Date: 02/28/2017      PT End of Session - 02/28/17 1402    Visit Number 9   Number of Visits 17   Date for PT Re-Evaluation 03/22/17   Authorization Type 9/10   PT Start Time 1400   PT Stop Time 1430   PT Time Calculation (min) 30 min   Activity Tolerance Patient tolerated treatment well   Behavior During Therapy [facility] for tasks assessed/performed      Past Medical History:  Diagnosis Date  . BPH (benign prostatic hyperplasia)   . Coronary artery disease   . Hypertension     Past Surgical History:  Procedure Laterality Date  . Intestinal Blockage    . SKIN CANCER EXCISION     x2  . TONSILLECTOMY      There were no vitals filed for this visit.      Subjective Assessment - 02/28/17 1400    Subjective Patient is doing fine today, no new issues. Patient has back pain 3/10. He would like to leave a little eary to be able to get to the beach.    Patient is accompained by: Family member   Pertinent History Subcortical hemorrhage of left cerebral hemisphere (01/05/2017), parkinson's, [facility] Spotted Fever, Basal cell CA, squamous cell CA, DDD cervical, OA lumbar, HTN, BPH, vertigo, CAD, peripheral neuropathy   Limitations Standing;Walking;House hold activities   Patient Stated Goals Walk in community, workout at Club, treadmill, work in yard,    Currently in Pain? Yes   Pain Score 3    Pain Location Back   Pain Orientation Lower   Pain Descriptors / Indicators Aching   Pain Type Chronic pain   Pain Onset More than a month ago   Pain Frequency Constant   Aggravating Factors  up standing and walking   Pain Relieving Factors lying down   Effect of  Pain on Daily Activities none         Treatment: NMR: gait training on treadmill x 5 min with "gait training" setting 1.7 mph, BUE support ; min-mod verbal cues to increase RLE step length; CGA, head turns and velocity changes. Side stepping on TM . 5 m/ hour and elevation 2 with CGA x 3 minutes left and right Blue foam toe taps to 6 inch stool x25 CGA Blue foam step ups to 6 inch stool fwd/bwd/side/side x25 each way CGA Tandem standing on 1/2 foam with CGA and cues for errect posture and looking up Tandem standing on purple foam with cues for upright posture and head position. 2 disk, green and blue and weight shifting fwd/bwd   Patient needs tactile cues for dynamic balance challenges and CGA for balance training. Patient has difficutly with narrow base of support                          PT Education - 02/28/17 1401    Education provided Yes   Education Details safety with steps   Person(s) Educated Patient   Methods Explanation   Comprehension Verbalized understanding          PT Short Term Goals - 02/26/17 1424      PT SHORT TERM GOAL #1  Title Patient & wife demonstrate understanding of initial HEP. (Target Date: 02/22/2017)   Time 4   Period Weeks   Status Achieved     PT SHORT TERM GOAL #2   Title Cognitive Timed Up-Go <12 seconds.  (Target Date: 02/22/2017)   Baseline 12 sec   Time 4   Period Weeks   Status Achieved     PT SHORT TERM GOAL #3   Title Patient ambulates with head turns without balance loss & maintaining path.  (Target Date: 02/22/2017)   Baseline achieved   Time 4   Period Weeks   Status Achieved     PT SHORT TERM GOAL #4   Title Berg Balance >45/56 to indicate lower fall risk.   (Target Date: 02/22/2017)   Baseline 50/56   Time 4   Status Partially Met           PT Long Term Goals - 02/26/17 1442      PT LONG TERM GOAL #1   Title Patient demonstrates with wife's cueing ongoing exercise plan. (Target Date:  03/22/2017)   Time 8   Period Weeks   Status On-going     PT LONG TERM GOAL #2   Title Berg Balance >/= 50/56 to indicate lower fall risk. (Target Date: 03/22/2017)   Baseline 50/56   Period Weeks   Status Achieved     PT LONG TERM GOAL #3   Title Cognitive Timed Up-Go increases <25% from standard TUG to indicate lower fall risk.  (Target Date: 03/22/2017)   Baseline 12 sec   Period Weeks   Status Achieved     PT LONG TERM GOAL #4   Title Functional Gait Assessment >20/30 indicating lower fall risk.  (Target Date: 03/22/2017)   Baseline NT   Period Weeks   Status On-going     PT LONG TERM GOAL #5   Title MiniBESTest >14/28 indicating lower fall risk.  (Target Date: 03/22/2017)   Baseline NT   Time 8   Period Weeks   Status On-going     PT LONG TERM GOAL #6   Title Patient reports low back pain with standing & walking </= 3/10 (Target Date: 03/22/2017)   Baseline 3/10   Period Weeks   Status Achieved               Plan - 02/28/17 1405    Clinical Impression Statement Pt presents with unsteadiness on uneven surfaces , gait velocity changes, and head turns, and fatigues with therapeutic exercises.  Patient tolerated all interventions well this date and will benefit from continued skilled PT interventions to improve strength and balance and decrease risk of falling.   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), parkinson's, [facility] Spotted Fever, Basal cell CA, squamous cell CA, DDD cervical, OA lumbar, HTN, BPH, vertigo, CAD, peripheral neuropathy   PT Frequency 2x / week   PT Duration 8 weeks   PT Treatment/Interventions ADLs/Self Care Home Management;Gait training;Stair training;Functional mobility training;Therapeutic activities;Therapeutic exercise;Balance training;Neuromuscular re-education;Patient/family education;Ultrasound;Moist Heat;Manual techniques   PT Next Visit Plan  Work on walking speed, endurance on treadmill, gait outside and on compliant surfaces, PWR exercises   Consulted and Agree with Plan of Care Patient      Patient will benefit from skilled therapeutic intervention in order to improve the following deficits and impairments:  Abnormal gait, Decreased activity tolerance, Decreased balance, Decreased mobility, Decreased strength, Pain  Visit Diagnosis: Hemianopsia  Other  lack of coordination  Unsteadiness on feet  Muscle weakness (generalized)  Aphasia     Problem List Patient Active Problem List   Diagnosis Date Noted  . Stenosis of right carotid artery 02/01/2017  . Acute embolic stroke ([place]) 47/18/5501  . Bradycardia 11/10/2015  . Essential tremor 06/03/2015  . Dizziness 08/21/2012  . Syncope 04/10/2011  . CALF PAIN, LEFT 10/11/2010  . DYSPEPSIA 07/31/2010  . CHEST DISCOMFORT 07/31/2010  . Hyperlipidemia 05/16/2010  . HYPERTENSION, BENIGN 05/16/2010  . CAD, NATIVE VESSEL 05/16/2010   Alanson Puls, PT, DPT [place], [place] S 02/28/2017, 2:26 PM  [place] MAIN [facility] SERVICES [address] [place], [place], [address] Phone: [phone]   Fax:  [phone]  Name: [name] [facility]. MRN: [id number] Date of Birth: [birth year]

## 2017-02-28 NOTE — Therapy (Signed)
[place] [facility] MAIN [facility] SERVICES [address] [place], [place], [address] Phone: [phone]   Fax:  [phone]  Speech Language Pathology Treatment  Patient Details  Name: [name]. MRN: [id number] Date of Birth: Sep 07, 1933 Referring Provider: Lorretta Harp, MD  Encounter Date: 02/28/2017      End of Session - 02/28/17 1547    Visit Number 9   Number of Visits 17   Date for SLP Re-Evaluation 03/29/17   SLP Start Time 1300   SLP Stop Time  1400   SLP Time Calculation (min) 60 min   Activity Tolerance Patient tolerated treatment well      Past Medical History:  Diagnosis Date  . BPH (benign prostatic hyperplasia)   . Coronary artery disease   . Hypertension     Past Surgical History:  Procedure Laterality Date  . Intestinal Blockage    . SKIN CANCER EXCISION     x2  . TONSILLECTOMY      There were no vitals filed for this visit.      Subjective Assessment - 02/28/17 1546    Subjective "Whatever"   Currently in Pain? No/denies               ADULT SLP TREATMENT - 02/28/17 0001      General Information   Behavior/Cognition Alert;Cooperative;Pleasant mood     Treatment Provided   Treatment provided Cognitive-Linquistic     Pain Assessment   Pain Assessment No/denies pain     Cognitive-Linquistic Treatment   Treatment focused on Aphasia   Skilled Treatment WORD FINDING: Rapid name category given 3 members with 68% accuracy and 92% given extra processing time.  Identify wrong category with 90% accuracy and state reason using category names with 80% accuracy.  Generate sentences to explain pictured safety hazards with cues for specificity.     Assessment / Recommendations / Plan   Plan Continue with current plan of care     Progression Toward Goals   Progression toward goals Progressing toward goals          SLP Education - 02/28/17 1546    Education provided Yes   Education Details Give  yourself time to find word or remember    Person(s) Educated Patient   Methods Explanation   Comprehension Verbalized understanding          SLP Short Term Goals - 02/14/17 1435      SLP SHORT TERM GOAL #1   Title pt will name average 10 items in concrete category in 60 seconds, over two sessions   Time 2   Period Weeks   Status On-going     SLP SHORT TERM GOAL #2   Title pt will demo compensations for expressive language in functional 7 minute mod complex conversation    Time 2   Period Weeks   Status On-going     SLP SHORT TERM GOAL #3   Title pt will undergo assessment of reading in first 3 therapy sessions   Status Achieved     SLP SHORT TERM GOAL #4   Title pt will read sentences of 10-12 words and demo comprehension at 90% success   Time 2   Period Weeks   Status On-going          SLP Long Term Goals - 02/14/17 1441      SLP LONG TERM GOAL #1   Title pt will use compensations for functional 10 minute mod-max complex conversation  Time 6   Period Weeks   Status On-going     SLP LONG TERM GOAL #2   Title  pt will demo [facility] tasks requiring memory compensations (meds, appointments, etc)   Time 6   Period Weeks   Status On-going     SLP LONG TERM GOAL #3   Title pt will demo functional reading comprehension in 3-4 sentence paragraphs   Time 6   Period Weeks   Status On-going          Plan - 02/28/17 1547    Clinical Impression Statement The patient is improving in reading by reading newspapers and personal reading.  His reading fluency is good.  The patient is completing simple word finding tasks, but becomes frustrated when he cannot rapidly find the word.   Speech Therapy Frequency 2x / week   Duration Other (comment)   Treatment/Interventions Language facilitation;Internal/external aids;SLP instruction and feedback;Multimodal communcation approach;Functional tasks;Cognitive reorganization;Oral motor exercises;Cueing hierarchy;Patient/family  education;Compensatory techniques   Potential to Achieve Goals Good   Potential Considerations Ability to learn/carryover information;Cooperation/participation level;Previous level of function;Severity of impairments;Family/community support   SLP Home Exercise Plan Keep a log of memory, communication, and cognitive problems   Consulted and Agree with Plan of Care Patient   Family Member Consulted Spouse      Patient will benefit from skilled therapeutic intervention in order to improve the following deficits and impairments:   Aphasia    Problem List Patient Active Problem List   Diagnosis Date Noted  . Stenosis of right carotid artery 02/01/2017  . Acute embolic stroke ([facility]) 02/01/2017  . Bradycardia 11/10/2015  . Essential tremor 06/03/2015  . Dizziness 08/21/2012  . Syncope 04/10/2011  . CALF PAIN, LEFT 10/11/2010  . DYSPEPSIA 07/31/2010  . CHEST DISCOMFORT 07/31/2010  . Hyperlipidemia 05/16/2010  . HYPERTENSION, BENIGN 05/16/2010  . CAD, NATIVE VESSEL 05/16/2010   Dollene PrimroseSusan G Sonam Huelsmann, MS/CCC- SLP  Leandrew KoyanagiAbernathy, Susie 02/28/2017, 3:48 PM  [place] [facility] [address] [place], [place], [address] Phone: [phone]   Fax:  [phone]   Name: Scarlette ShortsGeorge [facility] Jr[facility] MRN: [id number] Date of Birth: 1933/04/01

## 2017-03-04 ENCOUNTER — Ambulatory Visit: Payer: Medicare Other | Admitting: Physical Therapy

## 2017-03-04 ENCOUNTER — Encounter: Payer: Medicare Other | Admitting: Speech Pathology

## 2017-03-04 ENCOUNTER — Encounter: Payer: Medicare Other | Admitting: Occupational Therapy

## 2017-03-05 ENCOUNTER — Encounter: Payer: Self-pay | Admitting: Occupational Therapy

## 2017-03-05 ENCOUNTER — Ambulatory Visit: Payer: Medicare Other | Admitting: Occupational Therapy

## 2017-03-05 ENCOUNTER — Ambulatory Visit: Payer: Medicare Other | Attending: Physical Medicine and Rehabilitation | Admitting: Physical Therapy

## 2017-03-05 ENCOUNTER — Encounter: Payer: Self-pay | Admitting: Physical Therapy

## 2017-03-05 DIAGNOSIS — R278 Other lack of coordination: Secondary | ICD-10-CM | POA: Insufficient documentation

## 2017-03-05 DIAGNOSIS — M6281 Muscle weakness (generalized): Secondary | ICD-10-CM | POA: Insufficient documentation

## 2017-03-05 DIAGNOSIS — R4701 Aphasia: Secondary | ICD-10-CM | POA: Insufficient documentation

## 2017-03-05 DIAGNOSIS — R41842 Visuospatial deficit: Secondary | ICD-10-CM | POA: Insufficient documentation

## 2017-03-05 DIAGNOSIS — R2681 Unsteadiness on feet: Secondary | ICD-10-CM

## 2017-03-05 DIAGNOSIS — H5347 Heteronymous bilateral field defects: Secondary | ICD-10-CM

## 2017-03-05 NOTE — Therapy (Addendum)
[place] MAIN [facility] SERVICES [address] [place], [place], [address] Phone: [phone]   Fax:  [phone]  Physical Therapy Treatment/Progress Note  Patient Details  Name: [name] [id number] [facility]. MRN: [id number] Date of Birth: [birth year] Referring Provider: Launa Grill, MD  Encounter Date: 03/05/2017      PT End of Session - 03/05/17 0909    Visit Number 10   Number of Visits 17   Date for PT Re-Evaluation 03/22/17   Authorization Type 10/10   PT Start Time 0900   PT Stop Time 0940   PT Time Calculation (min) 40 min   Equipment Utilized During Treatment Gait belt   Activity Tolerance Patient tolerated treatment well   Behavior During Therapy WFL for tasks assessed/performed      Past Medical History:  Diagnosis Date  . BPH (benign prostatic hyperplasia)   . Coronary artery disease   . Hypertension     Past Surgical History:  Procedure Laterality Date  . Intestinal Blockage    . SKIN CANCER EXCISION     x2  . TONSILLECTOMY      There were no vitals filed for this visit.      Subjective Assessment - 03/05/17 0908    Subjective Patient is doing fine today, no new issues. Patient has back pain 2/10.    Patient is accompained by: Family member   Pertinent History Subcortical hemorrhage of left cerebral hemisphere (01/05/2017), parkinson's, [facility] Spotted Fever, Basal cell CA, squamous cell CA, DDD cervical, OA lumbar, HTN, BPH, vertigo, CAD, peripheral neuropathy   Limitations Standing;Walking;House hold activities   Patient Stated Goals Walk in community, workout at Club, treadmill, work in yard,    Currently in Pain? Yes   Pain Score 2    Pain Location Back   Pain Orientation Lower   Pain Descriptors / Indicators Aching   Pain Type Chronic pain   Pain Onset More than a month ago   Pain Frequency Constant   Aggravating Factors  up standing and walking   Pain Relieving Factors sitting   Effect of Pain on  Daily Activities none   Multiple Pain Sites No     Out come measures performed and goals #1,#2,#3,#4,#5 achieved. New target for Berg balance was set and adjusted for dynamic standing balance.  Treatment:    NEUROMUSCULAR RE-EDUCATION  Airex NBOS eyes open/closed x 30 seconds each;cues for upright posture CGA Airex NBOS eyes open horizontal and vertical head turns x 30 seconds; cues for upright posture,CGA Toe tapping 6 inch stool without UE assist,cues for upright posture,CGA Tandem gait in // bars x 4 laps,cues for upright posture,CGA Tandem standing on blue foam,cues for upright posture,CGA Standing on 1/2 foam with cues for upright posture,CGA Side stepping on blue foam balance beam x 5 lengths of the parallel bars ,CGA                          PT Education - 03/05/17 0909    Education provided Yes   Education Details HEP progression   Person(s) Educated Patient   Methods Explanation   Comprehension Verbalized understanding          PT Short Term Goals - 02/26/17 1424      PT SHORT TERM GOAL #1   Title Patient & wife demonstrate understanding of initial HEP. (Target Date: 02/22/2017)   Time 4   Period Weeks   Status Achieved  PT SHORT TERM GOAL #2   Title Cognitive Timed Up-Go <12 seconds.  (Target Date: 02/22/2017)   Baseline 12 sec   Time 4   Period Weeks   Status Achieved     PT SHORT TERM GOAL #3   Title Patient ambulates with head turns without balance loss & maintaining path.  (Target Date: 02/22/2017)   Baseline achieved   Time 4   Period Weeks   Status Achieved     PT SHORT TERM GOAL #4   Title Berg Balance >45/56 to indicate lower fall risk.   (Target Date: 02/22/2017)   Baseline 50/56   Time 4   Status Partially Met           PT Long Term Goals - 03/05/17 1049      PT LONG TERM GOAL #1   Title Patient demonstrates with wife's cueing ongoing exercise plan. (Target Date: 03/22/2017)   Time 8   Period Weeks   Status  On-going     PT LONG TERM GOAL #2   Title Berg Balance >/= 50/56 to indicate lower fall risk. (Target Date: 03/22/2017)   Baseline 50/56   Time 8   Period Weeks   Status Achieved     PT LONG TERM GOAL #3   Title Cognitive Timed Up-Go increases <25% from standard TUG to indicate lower fall risk.  (Target Date: 03/22/2017)   Baseline 12 sec   Time 8   Period Weeks   Status Achieved     PT LONG TERM GOAL #4   Title Functional Gait Assessment >20/30 indicating lower fall risk.  (Target Date: 03/22/2017)   Baseline 24   Time 8   Period Weeks   Status Achieved     PT LONG TERM GOAL #5   Title MiniBESTest >14/28 indicating lower fall risk.  (Target Date: 03/22/2017)   Baseline NT   Time 8   Period Weeks   Status On-going     Additional Long Term Goals   Additional Long Term Goals Yes     PT LONG TERM GOAL #6   Title Patient reports low back pain with standing & walking </= 3/10 (Target Date: 03/22/2017)   Baseline 2/10   Time 8   Period Weeks   Status Achieved     PT LONG TERM GOAL #7   Title Patient will improve Berg balance by 6 points indicating improved dynamic standing balance.   Baseline 50/56   Time 8   Period Weeks   Status New               Plan - 03/05/17 0910    Clinical Impression Statement Patient performs outcome measure and goals are reviewed. Patient has weakness in RLE ankle during uneven surfaces challenges and has difficulty with posture during high level balance challenges, and he needs UE support during these challenges. Patient is making progress towards his goals and will continue to benefit from skilled PT to improve balance and mobiity.    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), parkinson's, [facility] Spotted Fever, Basal cell CA, squamous cell CA, DDD cervical, OA lumbar, HTN, BPH, vertigo, CAD, peripheral neuropathy   PT Frequency 2x / week   PT  Duration 8 weeks   PT Treatment/Interventions ADLs/Self Care Home Management;Gait training;Stair training;Functional mobility training;Therapeutic activities;Therapeutic exercise;Balance training;Neuromuscular re-education;Patient/family education;Ultrasound;Moist Heat;Manual techniques   PT Next Visit Plan Work on walking speed, endurance on treadmill, gait outside  and on compliant surfaces, PWR exercises   Consulted and Agree with Plan of Care Patient      Patient will benefit from skilled therapeutic intervention in order to improve the following deficits and impairments:  Abnormal gait, Decreased activity tolerance, Decreased balance, Decreased mobility, Decreased strength, Pain  Visit Diagnosis: Hemianopsia  Other lack of coordination  Unsteadiness on feet     Problem List Patient Active Problem List   Diagnosis Date Noted  . Stenosis of right carotid artery 02/01/2017  . Acute embolic stroke ([place]) 03/40/3524  . Bradycardia 11/10/2015  . Essential tremor 06/03/2015  . Dizziness 08/21/2012  . Syncope 04/10/2011  . CALF PAIN, LEFT 10/11/2010  . DYSPEPSIA 07/31/2010  . CHEST DISCOMFORT 07/31/2010  . Hyperlipidemia 05/16/2010  . HYPERTENSION, BENIGN 05/16/2010  . CAD, NATIVE VESSEL 05/16/2010   Alanson Puls, PT, DPT [place], [place] S 03/05/2017, 2:52 PM  [place] MAIN [facility] SERVICES [address] [place], [place], [address] Phone: [phone]   Fax:  [phone]  Name: Dardan Shelton [facility]. MRN: [id number] Date of Birth: 02-23-1933

## 2017-03-06 ENCOUNTER — Encounter: Payer: Medicare Other | Admitting: Occupational Therapy

## 2017-03-06 ENCOUNTER — Ambulatory Visit: Payer: Medicare Other | Admitting: Physical Therapy

## 2017-03-07 ENCOUNTER — Encounter: Payer: Self-pay | Admitting: Physical Therapy

## 2017-03-07 ENCOUNTER — Ambulatory Visit: Payer: Medicare Other | Admitting: Speech Pathology

## 2017-03-07 ENCOUNTER — Ambulatory Visit: Payer: Medicare Other | Admitting: Physical Therapy

## 2017-03-07 ENCOUNTER — Encounter: Payer: Self-pay | Admitting: Speech Pathology

## 2017-03-07 DIAGNOSIS — H5347 Heteronymous bilateral field defects: Secondary | ICD-10-CM | POA: Diagnosis not present

## 2017-03-07 DIAGNOSIS — M6281 Muscle weakness (generalized): Secondary | ICD-10-CM

## 2017-03-07 DIAGNOSIS — R4701 Aphasia: Secondary | ICD-10-CM

## 2017-03-07 DIAGNOSIS — R278 Other lack of coordination: Secondary | ICD-10-CM

## 2017-03-07 NOTE — Therapy (Signed)
[place] MAIN [facility] SERVICES [address] [place], [place], [address] Phone: [phone]   Fax:  [phone]  Physical Therapy Treatment  Patient Details  Name: [name]. MRN: [id number] Date of Birth: 02/22/1933 Referring Provider: Launa Grill, MD  Encounter Date: 03/07/2017      PT End of Session - 03/07/17 1008    Visit Number 11   Number of Visits 17   Date for PT Re-Evaluation 03/22/17   Authorization Type 11/10   PT Start Time 0102   PT Stop Time 1042   PT Time Calculation (min) 580 min   Equipment Utilized During Treatment Gait belt   Activity Tolerance Patient tolerated treatment well   Behavior During Therapy [facility] for tasks assessed/performed      Past Medical History:  Diagnosis Date  . BPH (benign prostatic hyperplasia)   . Coronary artery disease   . Hypertension     Past Surgical History:  Procedure Laterality Date  . Intestinal Blockage    . SKIN CANCER EXCISION     x2  . TONSILLECTOMY      There were no vitals filed for this visit.      Subjective Assessment - 03/07/17 1008    Subjective Patient is doing fine today, no new issues. Patient has back pain 1/10.    Patient is accompained by: Family member   Pertinent History Subcortical hemorrhage of left cerebral hemisphere (01/05/2017), parkinson's, [facility] Spotted Fever, Basal cell CA, squamous cell CA, DDD cervical, OA lumbar, HTN, BPH, vertigo, CAD, peripheral neuropathy   Limitations Standing;Walking;House hold activities   Patient Stated Goals Walk in community, workout at Club, treadmill, work in yard,    Currently in Pain? Yes   Pain Score 1    Pain Orientation Lower   Pain Descriptors / Indicators Aching   Pain Type Chronic pain   Pain Onset More than a month ago   Pain Frequency Intermittent   Aggravating Factors  up standing and walking   Pain Relieving Factors sitting   Effect of Pain on Daily Activities none       Therapeutic exercise and neuromuscular training: 1/2 foam flat side up and balance with head turns left and right feet apart and feet together,; cues for keeping balance and using ankle and hips to maintain center of gravity  tandem standing on 1/2 foam  ; cues to maintain balance and posture correction standing hip abd with YTB x 20  ; cues for correct position and technique side stepping left and right in parallel bars 10 feet x 3; rotation cues for upright posture and not rotating hips towards the direction of motion step ups from floor to 6 inch stool x 20 bilateral; cues to try not to use UE and to keep correct head position leg press 1020lbs x 20 x 3, cues not to snap knees during extension and to perform slowly for max strengthening  marching in parallel bars x 20; cues to raise up knees level with therapists hands Tilt board fwd/bwd, side to side left and right; cues for posture correction Tm walking 2.2 m/hour x 5 mins, cues for heel toe gait TM elevation 2 with walking side stepping left and right x . 4 miles / hour; cues for posture corretion Patient needs occasional verbal cueing to improve posture and cueing to correctly perform exercises slowly, holding at end of range to increase motor firing of desired muscle to encourage fatigue. Patient has fwd flexed posture  and flexed knees when trying to balance on 1/2 foam                            PT Education - 03/07/17 1008    Education provided Yes   Education Details HEP   Person(s) Educated Patient   Methods Explanation   Comprehension Verbalized understanding          PT Short Term Goals - 02/26/17 1424      PT SHORT TERM GOAL #1   Title Patient & wife demonstrate understanding of initial HEP. (Target Date: 02/22/2017)   Time 4   Period Weeks   Status Achieved     PT SHORT TERM GOAL #2   Title Cognitive Timed Up-Go <12 seconds.  (Target Date: 02/22/2017)   Baseline 12 sec   Time 4   Period  Weeks   Status Achieved     PT SHORT TERM GOAL #3   Title Patient ambulates with head turns without balance loss & maintaining path.  (Target Date: 02/22/2017)   Baseline achieved   Time 4   Period Weeks   Status Achieved     PT SHORT TERM GOAL #4   Title Berg Balance >45/56 to indicate lower fall risk.   (Target Date: 02/22/2017)   Baseline 50/56   Time 4   Status Partially Met           PT Long Term Goals - 03/05/17 1049      PT LONG TERM GOAL #1   Title Patient demonstrates with wife's cueing ongoing exercise plan. (Target Date: 03/22/2017)   Time 8   Period Weeks   Status On-going     PT LONG TERM GOAL #2   Title Berg Balance >/= 50/56 to indicate lower fall risk. (Target Date: 03/22/2017)   Baseline 50/56   Time 8   Period Weeks   Status Achieved     PT LONG TERM GOAL #3   Title Cognitive Timed Up-Go increases <25% from standard TUG to indicate lower fall risk.  (Target Date: 03/22/2017)   Baseline 12 sec   Time 8   Period Weeks   Status Achieved     PT LONG TERM GOAL #4   Title Functional Gait Assessment >20/30 indicating lower fall risk.  (Target Date: 03/22/2017)   Baseline 24   Time 8   Period Weeks   Status Achieved     PT LONG TERM GOAL #5   Title MiniBESTest >14/28 indicating lower fall risk.  (Target Date: 03/22/2017)   Baseline NT   Time 8   Period Weeks   Status On-going     Additional Long Term Goals   Additional Long Term Goals Yes     PT LONG TERM GOAL #6   Title Patient reports low back pain with standing & walking </= 3/10 (Target Date: 03/22/2017)   Baseline 2/10   Time 8   Period Weeks   Status Achieved     PT LONG TERM GOAL #7   Title Patient will improve Berg balance by 6 points indicating improved dynamic standing balance.   Baseline 50/56   Time 8   Period Weeks   Status New               Plan - 03/07/17 1009    Clinical Impression Statement Patient instructed in LE strengthening exercise as part of HEP; also  instructed patient in advanced balance exercise. Patient requires rail assist  for SLS but was able to reduce HHA with modified tandem stance and dynamic tasks. Patient reports slight increase in back pain with prolonged standing.. Patient would benefit from additional skilled PT intervention to improve balance, gait safety and LE strength;   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), parkinson's, [facility] Spotted Fever, Basal cell CA, squamous cell CA, DDD cervical, OA lumbar, HTN, BPH, vertigo, CAD, peripheral neuropathy   PT Frequency 2x / week   PT Duration 8 weeks   PT Treatment/Interventions ADLs/Self Care Home Management;Gait training;Stair training;Functional mobility training;Therapeutic activities;Therapeutic exercise;Balance training;Neuromuscular re-education;Patient/family education;Ultrasound;Moist Heat;Manual techniques   PT Next Visit Plan Work on walking speed, endurance on treadmill, gait outside and on compliant surfaces, PWR exercises   Consulted and Agree with Plan of Care Patient      Patient will benefit from skilled therapeutic intervention in order to improve the following deficits and impairments:  Abnormal gait, Decreased activity tolerance, Decreased balance, Decreased mobility, Decreased strength, Pain  Visit Diagnosis: Hemianopsia  Other lack of coordination  Muscle weakness (generalized)  Aphasia     Problem List Patient Active Problem List   Diagnosis Date Noted  . Stenosis of right carotid artery 02/01/2017  . Acute embolic stroke ([place]) 48/35/0757  . Bradycardia 11/10/2015  . Essential tremor 06/03/2015  . Dizziness 08/21/2012  . Syncope 04/10/2011  . CALF PAIN, LEFT 10/11/2010  . DYSPEPSIA 07/31/2010  . CHEST DISCOMFORT 07/31/2010  . Hyperlipidemia 05/16/2010  . HYPERTENSION, BENIGN 05/16/2010  . CAD, NATIVE VESSEL 05/16/2010   Alanson Puls,  PT, DPT [place], [place] S 03/07/2017, 10:40 AM  [place] MAIN [facility] SERVICES [address] [place], [place], [address] Phone: [phone]   Fax:  [phone]  Name: [name] [facility]. MRN: [id number] Date of Birth: [birth year]

## 2017-03-07 NOTE — Therapy (Signed)
[place] MAIN [facility] SERVICES [address] [place], [place], [address] Phone: [phone]   Fax:  [phone]  Speech Language Pathology Treatment/Progress Note  Patient Details  Name: [name]. MRN: [id number] Date of Birth: 1933-01-14 Referring Provider: Launa Grill, MD  Encounter Date: 03/07/2017      End of Session - 03/07/17 1449    Visit Number 10   Number of Visits 17   Date for SLP Re-Evaluation 03/29/17   SLP Start Time 0900   SLP Stop Time  1000   SLP Time Calculation (min) 60 min   Activity Tolerance Patient tolerated treatment well      Past Medical History:  Diagnosis Date  . BPH (benign prostatic hyperplasia)   . Coronary artery disease   . Hypertension     Past Surgical History:  Procedure Laterality Date  . Intestinal Blockage    . SKIN CANCER EXCISION     x2  . TONSILLECTOMY      There were no vitals filed for this visit.      Subjective Assessment - 03/07/17 1448    Subjective "This is my hardest subject"               ADULT SLP TREATMENT - 03/07/17 0001      General Information   Behavior/Cognition Alert;Cooperative;Pleasant mood     Treatment Provided   Treatment provided Cognitive-Linquistic     Pain Assessment   Pain Assessment No/denies pain     Cognitive-Linquistic Treatment   Treatment focused on Aphasia   Skilled Treatment WORD FINDING: Rapid name pictured object with 80% accuracy and 100% given extra processing time.  Associations: rapidly state an associated word with 90% accuracy.  Part-to-whole: state item given part with 85% accuracy.  State 3 verbs associated with an item given mod-to-max SLP cues.  Name category given 3 members with 85% accuracy. Semantic feature analysis: give 3 responses to answer semantic feature analysis questions regarding an item with mod-to-max cues to expand primary response.     Assessment / Recommendations / Plan   Plan Continue  with current plan of care     Progression Toward Goals   Progression toward goals Progressing toward goals          SLP Education - 03/07/17 1449    Education provided Yes   Education Details semantic feature analysis   Person(s) Educated Patient   Methods Explanation   Comprehension Verbalized understanding          SLP Short Term Goals - 02/14/17 1435      SLP SHORT TERM GOAL #1   Title pt will name average 10 items in concrete category in 60 seconds, over two sessions   Time 2   Period Weeks   Status On-going     SLP SHORT TERM GOAL #2   Title pt will demo compensations for expressive language in functional 7 minute mod complex conversation    Time 2   Period Weeks   Status On-going     SLP SHORT TERM GOAL #3   Title pt will undergo assessment of reading in first 3 therapy sessions   Status Achieved     SLP SHORT TERM GOAL #4   Title pt will read sentences of 10-12 words and demo comprehension at 90% success   Time 2   Period Weeks   Status On-going          SLP Long Term Goals - 03/07/17 1451  SLP LONG TERM GOAL #1   Title pt will use compensations for functional 10 minute mod-max complex conversation   Time 6   Period Weeks   Status Partially Met     SLP LONG TERM GOAL #2   Title  pt will demo [facility] tasks requiring memory compensations (meds, appointments, etc)   Time 6   Period Weeks   Status Achieved     SLP LONG TERM GOAL #3   Title pt will demo functional reading comprehension in 3-4 sentence paragraphs   Time 6   Period Weeks   Status Achieved     SLP LONG TERM GOAL #4   Title Patient will generate grammatical and meaningful sentences to complete moderate cognitive-linguistic task with 80% accruacy.   Time 6   Period Weeks   Status New     SLP LONG TERM GOAL #5   Title Patient will complete high level word finding activities with 80% accuracy.   Time 6   Period Weeks   Status New          Plan - 03-22-2017 1450    Clinical  Impression Statement The patient had been receiving outpatient speech therapy in [place] and has transferred to our [place] office.  The patient is presenting with anomia and will benefit from continued ST for word finding.  He appears to be improving in self-cuing, but is not independent for word finding at this time.  The patient is improving in reading by reading newspapers and personal reading.  His reading fluency is good.  The patient is completing simple word finding tasks, but becomes frustrated when he cannot rapidly find the word.     Speech Therapy Frequency 2x / week   Duration Other (comment)   Treatment/Interventions Language facilitation;Internal/external aids;SLP instruction and feedback;Multimodal communcation approach;Functional tasks;Cognitive reorganization;Oral motor exercises;Cueing hierarchy;Patient/family education;Compensatory techniques   Potential to Achieve Goals Good   Potential Considerations Ability to learn/carryover information;Cooperation/participation level;Previous level of function;Severity of impairments;Family/community support   SLP Home Exercise Plan Keep a log of memory, communication, and cognitive problems   Consulted and Agree with Plan of Care Patient      Patient will benefit from skilled therapeutic intervention in order to improve the following deficits and impairments:   Aphasia      G-Codes - 22-Mar-2017 1455    Functional Assessment Tool Used therapeutic language tasks, clinical judgment   Functional Limitations Spoken language expressive   Spoken Language Expression Current Status [phone]) At least 20 percent but less than 40 percent impaired, limited or restricted   Spoken Language Expression Goal Status ([id number]) At least 1 percent but less than 20 percent impaired, limited or restricted      Problem List Patient Active Problem List   Diagnosis Date Noted  . Stenosis of right carotid artery 02/01/2017  . Acute embolic stroke ([place])  72/06/2181  . Bradycardia 11/10/2015  . Essential tremor 06/03/2015  . Dizziness 08/21/2012  . Syncope 04/10/2011  . CALF PAIN, LEFT 10/11/2010  . DYSPEPSIA 07/31/2010  . CHEST DISCOMFORT 07/31/2010  . Hyperlipidemia 05/16/2010  . HYPERTENSION, BENIGN 05/16/2010  . CAD, NATIVE VESSEL 05/16/2010   Leroy Sea, MS/CCC- SLP  Lou Miner Mar 22, 2017, 2:56 PM  [place] MAIN [facility] SERVICES [address] [place], [place], [address] Phone: [phone]   Fax:  [phone]   Name: [name] [facility]. MRN: [id number] Date of Birth: 01-Oct-1933

## 2017-03-10 NOTE — Therapy (Signed)
[place] MAIN [facility] SERVICES [address] [place], [place], [address] Phone: [phone]   Fax:  [phone]  Occupational Therapy Treatment  Patient Details  Name: [name]. MRN: [id number] Date of Birth: 1933-03-24 Referring Provider: Dr. Rea College  Encounter Date: 03/05/2017      OT End of Session - 03/10/17 1218    Visit Number 9   Number of Visits 16   Date for OT Re-Evaluation 03/21/17   Authorization Type medicare G-code 9   OT Start Time 0945   OT Stop Time 1031   OT Time Calculation (min) 46 min   Activity Tolerance Patient tolerated treatment well   Behavior During Therapy [facility] for tasks assessed/performed      Past Medical History:  Diagnosis Date  . BPH (benign prostatic hyperplasia)   . Coronary artery disease   . Hypertension     Past Surgical History:  Procedure Laterality Date  . Intestinal Blockage    . SKIN CANCER EXCISION     x2  . TONSILLECTOMY      There were no vitals filed for this visit.      Subjective Assessment - 03/10/17 1212    Subjective  Patient reports he really wants to get back to driving again, knows he has some issues with vision and that is what is holding him back.     Patient Stated Goals I want to get well and be perfect again.  I hate these questions (pt unable to give specific answer)   Currently in Pain? Yes   Pain Score 2    Pain Location Back   Pain Orientation Lower   Pain Descriptors / Indicators Aching   Pain Type Chronic pain   Pain Onset More than a month ago   Pain Frequency Intermittent                      OT Treatments/Exercises (OP) - 03/10/17 1214      Visual/Perceptual Exercises   Other Exercises Patient seen this date with focus on visual attention, scanning  and exercises for home program.  Patient seen for functional mobility in hallway while turning head to scan and find all the numbers on the wall, patient missed 2 on the  first round, 2nd round patient was asked to find all the black cards and missed one.  Patient instructed on use of cards for home program, scattered cards on tabletop and patient having to scan to find certain cards as therapist calls out card number and suit.  Patient required occasional cues and increased time to detect select cards.  Then transitioned to scattered cards and patient finding all 4 of a specific number and suit starting from ace to king.  Patient also seen for replication of diagonal pattern with 4 colors and pegs to create design, 100% accuracy.                  OT Education - 03/10/17 1213    Education provided Yes   Education Details visual scanning and attention, HEP with cards at home.   Person(s) Educated Patient   Methods Explanation;Verbal cues;Demonstration   Comprehension Verbalized understanding;Returned demonstration;Verbal cues required          OT Short Term Goals - 02/14/17 1457      OT SHORT TERM GOAL #1   Title Pt and wife will be mod I with HEP - 02/21/2017   Time 4   Period  Weeks   Status On-going  needs reinforcement of coordination HEP     OT SHORT TERM GOAL #2   Title Pt will be mod I with shower transfers   Status Achieved  met per pt report     OT SHORT TERM GOAL #3   Title Pt will be mod I with bathing   Status Achieved     OT SHORT TERM GOAL #4   Title Pt will complete table top scanning activity (not lanaguage or letter based) with 75% accuracy   Status Achieved  met for copying small peg design with min v.c.     OT SHORT TERM GOAL #5   Title Pt will attend to familiar task in busy environment with no more than min vc's.     Status Achieved           OT Long Term Goals - 03/05/17 0951      OT LONG TERM GOAL #1   Title Pt and wife will be mod I with ugraded HEP prn - 03/21/2017   Time 8   Period Weeks   Status On-going     OT LONG TERM GOAL #2   Title Pt will demonstrate improved coordinaton by decreasing time on  9 hole peg test by 4 seconds (baseline=34.15)   Time 8   Period Weeks   Status Achieved  02/18/17:  25.81sec     OT LONG TERM GOAL #3   Title Pt will be able to print/write name legibly AE prn- (revised goal) Pt will demonstrate ability to write a 4 sentence paragraph with 100% legibility, and no significant decr. in letter size using AE PRN.   Time 8   Period Weeks   Status Achieved  inital goal met, updated goal met 02/14/17     OT LONG TERM GOAL #4   Title Pt will demonstate ability to maintain balance on outdoor uneven surfaces in preparation for returning to simple yard work.    Time 8   Status On-going     OT LONG TERM GOAL #5   Title Pt will require no more than 2 vc's to recall information from therapy session   Time 8   Period Weeks   Status On-going     OT LONG TERM GOAL #6   Title Pt will complete table top scanning activity (not language based) with 90% accuracy   Status On-going     OT LONG TERM GOAL #7   Title Pt will complete environmental scanning task with at least 75% accuracy.  Revised to 90% accuracy 02/14/17   Time 8   Period Weeks   Status On-going  60%, 9/15 located first pass, 2 more trips to locate remaining items.  02/14/17  80% today in min distracting environment     OT [place] #8   Title Pt will perform simple divided attention with environmental scanning for improved safety for ambulation/in prep for driving with at least 75% accuracy.   Period Weeks   Status New               Plan - 03/10/17 1219    Clinical Impression Statement Patient improved with hallway scanning this date.  Instructed on exercises at home he can perform alone to increase awareness, scanning and attention with scattered card method and a variety of search patterns.  He performed well with copying diagonal designs this date.  Patient would like to get back to driving, discussed the safety issues and implications of  his visual deficits and he reports understanding.      Rehab Potential Good   OT Frequency 2x / week   OT Duration 8 weeks   OT Treatment/Interventions Self-care/ADL training;Electrical Stimulation;Therapeutic exercise;Neuromuscular education;DME and/or AE instruction;Energy conservation;Therapist, nutritional;Therapeutic activities;Cognitive remediation/compensation;Visual/perceptual remediation/compensation;Patient/family education;Balance training   Consulted and Agree with Plan of Care Patient      Patient will benefit from skilled therapeutic intervention in order to improve the following deficits and impairments:  Decreased balance, Decreased cognition, Decreased coordination, Decreased safety awareness, Decreased mobility, Decreased knowledge of use of DME, Impaired UE functional use, Impaired vision/preception  Visit Diagnosis: Hemianopsia  Other lack of coordination  Visuospatial deficit  Muscle weakness (generalized)    Problem List Patient Active Problem List   Diagnosis Date Noted  . Stenosis of right carotid artery 02/01/2017  . Acute embolic stroke ([place]) 61/47/0929  . Bradycardia 11/10/2015  . Essential tremor 06/03/2015  . Dizziness 08/21/2012  . Syncope 04/10/2011  . CALF PAIN, LEFT 10/11/2010  . DYSPEPSIA 07/31/2010  . CHEST DISCOMFORT 07/31/2010  . Hyperlipidemia 05/16/2010  . HYPERTENSION, BENIGN 05/16/2010  . CAD, NATIVE VESSEL 05/16/2010   [name], OTR/L, CLT  [name] 03/10/2017, 12:23 PM  [place] MAIN [facility] SERVICES [address] [place], [place], [address] Phone: [phone]   Fax:  [phone]  Name: [name] [facility]. MRN: [id number] Date of Birth: Sep 08, 1933

## 2017-03-11 ENCOUNTER — Ambulatory Visit: Payer: Medicare Other | Attending: Physical Medicine and Rehabilitation | Admitting: Physical Therapy

## 2017-03-11 ENCOUNTER — Encounter: Payer: Medicare Other | Admitting: Occupational Therapy

## 2017-03-11 ENCOUNTER — Ambulatory Visit: Payer: Medicare Other | Admitting: Occupational Therapy

## 2017-03-11 ENCOUNTER — Encounter: Payer: Medicare Other | Admitting: Speech Pathology

## 2017-03-11 ENCOUNTER — Encounter: Payer: Self-pay | Admitting: Speech Pathology

## 2017-03-11 ENCOUNTER — Ambulatory Visit: Payer: Medicare Other | Admitting: Physical Therapy

## 2017-03-11 ENCOUNTER — Encounter: Payer: Self-pay | Admitting: Physical Therapy

## 2017-03-11 ENCOUNTER — Ambulatory Visit: Payer: Medicare Other | Admitting: Speech Pathology

## 2017-03-11 DIAGNOSIS — H5347 Heteronymous bilateral field defects: Secondary | ICD-10-CM | POA: Diagnosis not present

## 2017-03-11 DIAGNOSIS — R278 Other lack of coordination: Secondary | ICD-10-CM | POA: Insufficient documentation

## 2017-03-11 DIAGNOSIS — R4701 Aphasia: Secondary | ICD-10-CM

## 2017-03-11 DIAGNOSIS — R41842 Visuospatial deficit: Secondary | ICD-10-CM | POA: Insufficient documentation

## 2017-03-11 DIAGNOSIS — R2681 Unsteadiness on feet: Secondary | ICD-10-CM | POA: Insufficient documentation

## 2017-03-11 DIAGNOSIS — M6281 Muscle weakness (generalized): Secondary | ICD-10-CM

## 2017-03-11 NOTE — Therapy (Signed)
[place] MAIN [facility] SERVICES [address] [place], [place], [address] Phone: [phone]   Fax:  [phone]  Physical Therapy Treatment  Patient Details  Name: [name]. MRN: [id number] Date of Birth: 1933-10-26 Referring Provider: Launa Grill, MD  Encounter Date: 03/11/2017      PT End of Session - 03/11/17 1120    Visit Number 12   Number of Visits 17   Date for PT Re-Evaluation 03/22/17   Authorization Type 12/10   PT Start Time 1105   PT Stop Time 1145   PT Time Calculation (min) 40 min   Equipment Utilized During Treatment Gait belt   Activity Tolerance Patient tolerated treatment well   Behavior During Therapy WFL for tasks assessed/performed      Past Medical History:  Diagnosis Date  . BPH (benign prostatic hyperplasia)   . Coronary artery disease   . Hypertension     Past Surgical History:  Procedure Laterality Date  . Intestinal Blockage    . SKIN CANCER EXCISION     x2  . TONSILLECTOMY      There were no vitals filed for this visit.      Subjective Assessment - 03/11/17 1116    Subjective Patient is doing fine today, no new issues. Patient has back pain 0/10.    Patient is accompained by: Family member   Pertinent History Subcortical hemorrhage of left cerebral hemisphere (01/05/2017), parkinson's, [facility] Spotted Fever, Basal cell CA, squamous cell CA, DDD cervical, OA lumbar, HTN, BPH, vertigo, CAD, peripheral neuropathy   Limitations Standing;Walking;House hold activities   Patient Stated Goals Walk in community, workout at Club, treadmill, work in yard,    Currently in Pain? No/denies   Pain Score 0-No pain   Pain Onset More than a month ago   Multiple Pain Sites No      Neuromuscular training: 1/2 foam flat side up and balance with head turns left and right feet apart and feet together,, cone reaching and placing cones across midline ,CGA and cues for upright posture  tandem  standing on blue foam,CGA and cues for upright posture Rocker board fwd/ bwd with head turns , CGA and cues for upright posture   side stepping left and right in parallel bars , no hands10 feet x 10, GTB,  CGA and cues for upright posture step ups from floor to 6 inch stool, no hands x 20 bilateral Tilt board fwd/bwd, side to side left and right, CGA and cues for upright posture Stepping over bolster left and right and fwd/bwd, cues for head position Tapping stool from blue foam x 20 and cues for posture correction Tm walking 1. 5 m/hour x 5 mins, side stepping left and right with elevation 2 . 4  m/ hr, and cues for correct hip alignment                           PT Education - 03/11/17 1117    Education provided Yes   Education Details HEp progression   Person(s) Educated Patient   Methods Explanation   Comprehension Verbalized understanding          PT Short Term Goals - 02/26/17 1424      PT SHORT TERM GOAL #1   Title Patient & wife demonstrate understanding of initial HEP. (Target Date: 02/22/2017)   Time 4   Period Weeks   Status Achieved     PT  SHORT TERM GOAL #2   Title Cognitive Timed Up-Go <12 seconds.  (Target Date: 02/22/2017)   Baseline 12 sec   Time 4   Period Weeks   Status Achieved     PT SHORT TERM GOAL #3   Title Patient ambulates with head turns without balance loss & maintaining path.  (Target Date: 02/22/2017)   Baseline achieved   Time 4   Period Weeks   Status Achieved     PT SHORT TERM GOAL #4   Title [place] Balance >45/56 to indicate lower fall risk.   (Target Date: 02/22/2017)   Baseline 50/56   Time 4   Status Partially Met           PT Long Term Goals - 03/05/17 1049      PT LONG TERM GOAL #1   Title Patient demonstrates with wife's cueing ongoing exercise plan. (Target Date: 03/22/2017)   Time 8   Period Weeks   Status On-going     PT LONG TERM GOAL #2   Title [place] Balance >/= 50/56 to indicate lower fall risk.  (Target Date: 03/22/2017)   Baseline 50/56   Time 8   Period Weeks   Status Achieved     PT LONG TERM GOAL #3   Title Cognitive Timed Up-Go increases <25% from standard TUG to indicate lower fall risk.  (Target Date: 03/22/2017)   Baseline 12 sec   Time 8   Period Weeks   Status Achieved     PT LONG TERM GOAL #4   Title Functional Gait Assessment >20/30 indicating lower fall risk.  (Target Date: 03/22/2017)   Baseline 24   Time 8   Period Weeks   Status Achieved     PT LONG TERM GOAL #5   Title MiniBESTest >14/28 indicating lower fall risk.  (Target Date: 03/22/2017)   Baseline NT   Time 8   Period Weeks   Status On-going     Additional Long Term Goals   Additional Long Term Goals Yes     PT LONG TERM GOAL #6   Title Patient reports low back pain with standing & walking </= 3/10 (Target Date: 03/22/2017)   Baseline 2/10   Time 8   Period Weeks   Status Achieved     PT LONG TERM GOAL #7   Title Patient will improve Berg balance by 6 points indicating improved dynamic standing balance.   Baseline 50/56   Time 8   Period Weeks   Status New               Plan - 03/11/17 1120    Clinical Impression Statement Patient instructed in advanced strengthening and balance exercise. She requires min Vcs for correct exercise technique including to improve trunk control with standing exercise. Patient demonstrates better quad control with SLS tasks with rail assist. Patient would benefit from additional skilled PT intervention to improve balance/gait safety and reduce fall risk;   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), parkinson's, [facility] Spotted Fever, Basal cell CA, squamous cell CA, DDD cervical, OA lumbar, HTN, BPH, vertigo, CAD, peripheral neuropathy   PT Frequency 2x / week   PT Duration 8 weeks   PT Treatment/Interventions ADLs/Self Care Home Management;Gait  training;Stair training;Functional mobility training;Therapeutic activities;Therapeutic exercise;Balance training;Neuromuscular re-education;Patient/family education;Ultrasound;Moist Heat;Manual techniques   PT Next Visit Plan Work on walking speed, endurance on treadmill, gait outside and on compliant surfaces, PWR exercises  Consulted and Agree with Plan of Care Patient      Patient will benefit from skilled therapeutic intervention in order to improve the following deficits and impairments:  Abnormal gait, Decreased activity tolerance, Decreased balance, Decreased mobility, Decreased strength, Pain  Visit Diagnosis: Hemianopsia  Other lack of coordination  Muscle weakness (generalized)  Aphasia  Unsteadiness on feet  Visuospatial deficit     Problem List Patient Active Problem List   Diagnosis Date Noted  . Stenosis of right carotid artery 02/01/2017  . Acute embolic stroke ([place]) 17/00/1749  . Bradycardia 11/10/2015  . Essential tremor 06/03/2015  . Dizziness 08/21/2012  . Syncope 04/10/2011  . CALF PAIN, LEFT 10/11/2010  . DYSPEPSIA 07/31/2010  . CHEST DISCOMFORT 07/31/2010  . Hyperlipidemia 05/16/2010  . HYPERTENSION, BENIGN 05/16/2010  . CAD, NATIVE VESSEL 05/16/2010  Alanson Puls, PT, DPT [place] 03/11/2017, 11:34 AM  [place] MAIN [facility] SERVICES [address] [place], [place], [address] Phone: [phone]   Fax:  [phone]  Name: [name] [facility]. MRN: [id number] Date of Birth: 01/29/1933

## 2017-03-11 NOTE — Therapy (Addendum)
[place] MAIN [facility] SERVICES [address] [place], [place], [address] Phone: [phone]   Fax:  [phone]  Occupational Therapy Progress/G-Code Report  Patient Details  Name: [name]. MRN: [id number] Date of Birth: [birth year] Referring Provider: Dr. Rea College  Encounter Date: 03/11/2017      OT End of Session - 03/11/17 0949    Visit Number 10   Number of Visits 16   Date for OT Re-Evaluation 03/21/17   Authorization Type medicare G-code 10   OT Start Time 0915   OT Stop Time 1000   OT Time Calculation (min) 45 min      Past Medical History:  Diagnosis Date  . BPH (benign prostatic hyperplasia)   . Coronary artery disease   . Hypertension     Past Surgical History:  Procedure Laterality Date  . Intestinal Blockage    . SKIN CANCER EXCISION     x2  . TONSILLECTOMY      There were no vitals filed for this visit.      Subjective Assessment - 03/11/17 0947    Subjective  Pt. reports he watched the [employer] this past weekend.   Currently in Pain? No/denies                      OT Treatments/Exercises (OP) - 03/11/17 1017      Visual/Perceptual Exercises   Other Exercises Pt. worked visual scanning, visual tracking, and visual compensatory strategies for ADL, and IADL functioning. Pt. was administered the [facility] which indicated visual impairment in the right aspect of the right eye which may hinder ADL and IADL tasks in near space at tabletop, and extrapersonal space in hi environment. Pt. Worked on saccades in preparation for reading, and writing, with difficulty at, 6", and shoulder with apart.                 OT Education - 03/11/17 0949    Education provided Yes   Education Details [place] scanning   Person(s) Educated Patient   Methods Explanation;Verbal cues;Demonstration   Comprehension Verbalized understanding;Returned demonstration;Verbal cues  required          OT Short Term Goals - 02/14/17 1457      OT SHORT TERM GOAL #1   Title Pt and wife will be mod I with HEP - 02/21/2017   Time 4   Period Weeks   Status On-going  needs reinforcement of coordination HEP     OT SHORT TERM GOAL #2   Title Pt will be mod I with shower transfers   Status Achieved  met per pt report     OT SHORT TERM GOAL #3   Title Pt will be mod I with bathing   Status Achieved     OT SHORT TERM GOAL #4   Title Pt will complete table top scanning activity (not lanaguage or letter based) with 75% accuracy   Status Achieved  met for copying small peg design with min v.c.     OT SHORT TERM GOAL #5   Title Pt will attend to familiar task in busy environment with no more than min vc's.     Status Achieved           OT Long Term Goals - 03/11/17 0957      OT LONG TERM GOAL #1   Title Pt and wife will be mod I with ugraded HEP prn - 03/21/2017  Time 8   Period Weeks   Status On-going     OT LONG TERM GOAL #2   Title Pt will demonstrate improved coordinaton by decreasing time on 9 hole peg test by 4 seconds (baseline=34.15)   Time 8   Period Weeks   Status Achieved     OT LONG TERM GOAL #3   Title Pt will be able to print/write name legibly AE prn- (revised goal) Pt will demonstrate ability to write a 4 sentence paragraph with 100% legibility, and no significant decr. in letter size using AE PRN.   Time 8   Period Weeks   Status Achieved     OT LONG TERM GOAL #4   Title Pt will demonstate ability to maintain balance on outdoor uneven surfaces in preparation for returning to simple yard work.    Time 8   Period Weeks   Status On-going     OT LONG TERM GOAL #5   Title Pt will require no more than 2 vc's to recall information from therapy session   Time 8   Period Weeks   Status On-going     OT LONG TERM GOAL #6   Title Pt will complete table top scanning activity (not language based) with 90% accuracy   Time 8   Period Weeks    Status On-going     OT LONG TERM GOAL #7   Title Pt will complete environmental scanning task with at least 75% accuracy.  Revised to 90% accuracy 02/14/17   Time 8   Period Weeks   Status On-going     OT LONG TERM GOAL #8   Title Pt will perform simple divided attention with environmental scanning for improved safety for ambulation/in prep for driving with at least 75% accuracy.   Period Weeks   Status New               Plan - Apr 01, 2017 0950    Clinical Impression Statement Pt. reports having difficulty following the writing on the Television screen quick enough when watching the [employer]. Pt continues to present with limited vision in the right side of his right eye. Pt continues to require cues, and assist for scanning, and compensatory techniques during ADL/IADL tasks.  Goals were reviewed with pt..   Rehab Potential Good   OT Frequency 2x / week   OT Duration 8 weeks   OT Treatment/Interventions Self-care/ADL training;Electrical Stimulation;Therapeutic exercise;Neuromuscular education;DME and/or AE instruction;Energy conservation;Therapist, nutritional;Therapeutic activities;Cognitive remediation/compensation;Visual/perceptual remediation/compensation;Patient/family education;Balance training   Consulted and Agree with Plan of Care Patient   Family Member Consulted wife [name]      Patient will benefit from skilled therapeutic intervention in order to improve the following deficits and impairments:  Decreased balance, Decreased cognition, Decreased coordination, Decreased safety awareness, Decreased mobility, Decreased knowledge of use of DME, Impaired UE functional use, Impaired vision/preception  Visit Diagnosis: Hemianopsia      G-Codes - April 01, 2017 1020    Functional Assessment Tool Used (Outpatient only) skilled clinical observation   Functional Limitation Self care   Self Care Current Status (P1025) At least 40 percent but less than 60  percent impaired, limited or restricted   Self Care Goal Status (E5277) At least 20 percent but less than 40 percent impaired, limited or restricted      Problem List Patient Active Problem List   Diagnosis Date Noted  . Stenosis of right carotid artery 02/01/2017  . Acute embolic stroke ([place]) 82/42/3536  . Bradycardia 11/10/2015  .  Essential tremor 06/03/2015  . Dizziness 08/21/2012  . Syncope 04/10/2011  . CALF PAIN, LEFT 10/11/2010  . DYSPEPSIA 07/31/2010  . CHEST DISCOMFORT 07/31/2010  . Hyperlipidemia 05/16/2010  . HYPERTENSION, BENIGN 05/16/2010  . CAD, NATIVE VESSEL 05/16/2010    Harrel Carina, MS, OTR/L 03/11/2017, 10:23 AM  [place] MAIN [facility] SERVICES [address] [place], [place], [address] Phone: [phone]   Fax:  [phone]  Name: Calvin Beilke [facility]. MRN: [id number] Date of Birth: 09-Feb-1933

## 2017-03-11 NOTE — Patient Instructions (Signed)
OT TREATMENT     Neuro muscular re-education:  Therapeutic Exercise:  Selfcare:  Pt. worked visual scanning, visual tracking, and visual compensatory strategies for ADL, and IADL functioning. Pt. was administered the [facility] which indicated visual impairment in the right aspect of the right eye which may hinder ADL and IADL tasks in near space at tabletop, and extrapersonal space in hi environment. Pt. Worked on saccades in preparation for reading, and writing, with difficulty at, 6", and shoulder with apart.   Manual Therapy:

## 2017-03-11 NOTE — Therapy (Signed)
[place] MAIN [facility] SERVICES [address] [place], [place], [address] Phone: [phone]   Fax:  [phone]  Speech Language Pathology Treatment  Patient Details  Name: [name]. MRN: [id number] Date of Birth: Dec 10, 1932 Referring Provider: Launa Grill, MD  Encounter Date: 03/11/2017      End of Session - 03/11/17 1207    Visit Number 11   Number of Visits 17   Date for SLP Re-Evaluation 03/29/17   SLP Start Time 59   SLP Stop Time  1105   SLP Time Calculation (min) 60 min   Activity Tolerance Patient tolerated treatment well      Past Medical History:  Diagnosis Date  . BPH (benign prostatic hyperplasia)   . Coronary artery disease   . Hypertension     Past Surgical History:  Procedure Laterality Date  . Intestinal Blockage    . SKIN CANCER EXCISION     x2  . TONSILLECTOMY      There were no vitals filed for this visit.      Subjective Assessment - 03/11/17 1205    Subjective "this is the hardest thing that bothers me the most since my stroke"   Currently in Pain? No/denies       Aphasia treatment: WORD FINDING: Convergent and divergent naming category task with 90% accuracy with minimal semantic and phonemic cueing. Sequencing and sentence structuring: reorganizing sentences correctly with 90% and cueing with lengthiest task. Verbally Elaborate sentences using factual recall: answering "why" questions with 100% accuracy however, noted a breakdown in responding in full sentence format (pt tended to use brief respond, not full sentences and did not fully elaborate).           SLP Education - 03/11/17 1206    Education provided Yes   Education Details HEP progression   Person(s) Educated Patient   Methods Explanation   Comprehension Verbalized understanding          SLP Short Term Goals - 02/14/17 1435      SLP SHORT TERM GOAL #1   Title pt will name average 10 items in concrete  category in 60 seconds, over two sessions   Time 2   Period Weeks   Status On-going     SLP SHORT TERM GOAL #2   Title pt will demo compensations for expressive language in functional 7 minute mod complex conversation    Time 2   Period Weeks   Status On-going     SLP SHORT TERM GOAL #3   Title pt will undergo assessment of reading in first 3 therapy sessions   Status Achieved     SLP SHORT TERM GOAL #4   Title pt will read sentences of 10-12 words and demo comprehension at 90% success   Time 2   Period Weeks   Status On-going          SLP Long Term Goals - 03/07/17 [place] #1   Title pt will use compensations for functional 10 minute mod-max complex conversation   Time 6   Period Weeks   Status Partially Met     SLP LONG TERM GOAL #2   Title  pt will demo [facility] tasks requiring memory compensations (meds, appointments, etc)   Time 6   Period Weeks   Status Achieved     SLP LONG TERM GOAL #3   Title pt will demo functional reading comprehension in 3-4 sentence  paragraphs   Time 6   Period Weeks   Status Achieved     SLP LONG TERM GOAL #4   Title Patient will generate grammatical and meaningful sentences to complete moderate cognitive-linguistic task with 80% accruacy.   Time 6   Period Weeks   Status New     SLP LONG TERM GOAL #5   Title Patient will complete high level word finding activities with 80% accuracy.   Time 6   Period Weeks   Status New          Plan - 03/11/17 1208    Clinical Impression Statement The patient had been receiving outpatient speech therapy in [place] and has transferred to our [place] office.  The patient is presenting with anomia and will benefit from continued ST for word finding.  He appears to be improving in self-cuing, but is not independent for word finding at this time.  The patient is improving in reading by reading newspapers and personal reading.  His reading fluency is good.  The patient is  completing simple word finding and sequencing tasks, but becomes frustrated when he cannot rapidly find the word. Sequencing tasks appears improved overall. Pt benefits from a multiple hieracrchy of cues including phonemic and semantic cues to improve his word finding.    Speech Therapy Frequency 2x / week   Duration Other (comment)   Treatment/Interventions Language facilitation;Internal/external aids;SLP instruction and feedback;Multimodal communcation approach;Functional tasks;Cognitive reorganization;Oral motor exercises;Cueing hierarchy;Patient/family education;Compensatory techniques   Potential to Achieve Goals Good   Potential Considerations Ability to learn/carryover information;Cooperation/participation level;Previous level of function;Severity of impairments;Family/community support   SLP Home Exercise Plan Keep a log of memory, communication, and cognitive problems   Consulted and Agree with Plan of Care Patient      Patient will benefit from skilled therapeutic intervention in order to improve the following deficits and impairments:   Aphasia    Problem List Patient Active Problem List   Diagnosis Date Noted  . Stenosis of right carotid artery 02/01/2017  . Acute embolic stroke ([place]) 83/50/7573  . Bradycardia 11/10/2015  . Essential tremor 06/03/2015  . Dizziness 08/21/2012  . Syncope 04/10/2011  . CALF PAIN, LEFT 10/11/2010  . DYSPEPSIA 07/31/2010  . CHEST DISCOMFORT 07/31/2010  . Hyperlipidemia 05/16/2010  . HYPERTENSION, BENIGN 05/16/2010  . CAD, NATIVE VESSEL 05/16/2010     Orinda Kenner, [place], CCC-SLP Audianna Landgren 03/11/2017, 12:46 PM  [place] MAIN [facility] SERVICES [address] [place], [place], [address] Phone: [phone]   Fax:  [phone]   Name: [name] [facility]. MRN: [id number] Date of Birth: [birth year]

## 2017-03-13 ENCOUNTER — Ambulatory Visit: Payer: Medicare Other | Admitting: Physical Therapy

## 2017-03-13 ENCOUNTER — Ambulatory Visit: Payer: Medicare Other | Admitting: Speech Pathology

## 2017-03-13 ENCOUNTER — Encounter: Payer: Self-pay | Admitting: Speech Pathology

## 2017-03-13 ENCOUNTER — Encounter: Payer: Medicare Other | Admitting: Occupational Therapy

## 2017-03-13 ENCOUNTER — Ambulatory Visit: Payer: Medicare Other | Admitting: Occupational Therapy

## 2017-03-13 DIAGNOSIS — M6281 Muscle weakness (generalized): Secondary | ICD-10-CM

## 2017-03-13 DIAGNOSIS — R2681 Unsteadiness on feet: Secondary | ICD-10-CM

## 2017-03-13 DIAGNOSIS — R4701 Aphasia: Secondary | ICD-10-CM

## 2017-03-13 DIAGNOSIS — H5347 Heteronymous bilateral field defects: Secondary | ICD-10-CM

## 2017-03-13 DIAGNOSIS — R278 Other lack of coordination: Secondary | ICD-10-CM

## 2017-03-13 NOTE — Therapy (Signed)
[place] MAIN [facility] SERVICES [address] [place], [place], [address] Phone: [phone]   Fax:  [phone]  Speech Language Pathology Treatment  Patient Details  Name: [name]. MRN: [id number] Date of Birth: [birth year] Referring Provider: Launa Grill, MD  Encounter Date: 03/13/2017      End of Session - 03/13/17 1129    Visit Number 12   Number of Visits 17   Date for SLP Re-Evaluation 03/29/17   SLP Start Time 1010   SLP Stop Time  1105   SLP Time Calculation (min) 55 min   Activity Tolerance Patient tolerated treatment well      Past Medical History:  Diagnosis Date  . BPH (benign prostatic hyperplasia)   . Coronary artery disease   . Hypertension     Past Surgical History:  Procedure Laterality Date  . Intestinal Blockage    . SKIN CANCER EXCISION     x2  . TONSILLECTOMY      There were no vitals filed for this visit.      Subjective Assessment - 03/13/17 1126    Subjective pt appears hard on himself and his progress in treatment   Currently in Pain? No/denies       Aphasia treatment: WORD FINDING TASKS: Divergent naming category task with 90% accuracy with minimal semantic and phonemic cueing. Pt was able to quickly recall 2 words, however, required significant extra time and cueing for a third word/choice. Patient required verbal cues to expand on word choice with topics given. Sequencing and sentence structuring: reorganizing sentences correctly with 80% and cueing with lengthiest task. Verbally Elaborate/Expand on sentences using factual recall: answering "what" questions with 100% accuracy however, noted a breakdown in time that the patient required to complete correct sentence structure and organize thoughts/words.  Pt was able to verbally describe(recall) and give detailed personal information recalling events of his past w/ more ease and less hesitation/word finding errors - possibly d/t  this being familiar information(of self).           SLP Education - 03/13/17 1127    Education provided Yes   Education Details strategies to enhance word recall; expanding on answers; reading   Person(s) Educated Patient   Methods Explanation;Demonstration;Verbal cues   Comprehension Verbalized understanding;Returned demonstration;Verbal cues required          SLP Short Term Goals - 02/14/17 1435      SLP SHORT TERM GOAL #1   Title pt will name average 10 items in concrete category in 60 seconds, over two sessions   Time 2   Period Weeks   Status On-going     SLP SHORT TERM GOAL #2   Title pt will demo compensations for expressive language in functional 7 minute mod complex conversation    Time 2   Period Weeks   Status On-going     SLP SHORT TERM GOAL #3   Title pt will undergo assessment of reading in first 3 therapy sessions   Status Achieved     SLP SHORT TERM GOAL #4   Title pt will read sentences of 10-12 words and demo comprehension at 90% success   Time 2   Period Weeks   Status On-going          SLP Long Term Goals - 03/07/17 1451      SLP LONG TERM GOAL #1   Title pt will use compensations for functional 10 minute mod-max complex conversation   Time 6  Period Weeks   Status Partially Met     SLP LONG TERM GOAL #2   Title  pt will demo [facility] tasks requiring memory compensations (meds, appointments, etc)   Time 6   Period Weeks   Status Achieved     SLP LONG TERM GOAL #3   Title pt will demo functional reading comprehension in 3-4 sentence paragraphs   Time 6   Period Weeks   Status Achieved     SLP LONG TERM GOAL #4   Title Patient will generate grammatical and meaningful sentences to complete moderate cognitive-linguistic task with 80% accruacy.   Time 6   Period Weeks   Status New     SLP LONG TERM GOAL #5   Title Patient will complete high level word finding activities with 80% accuracy.   Time 6   Period Weeks   Status New           Plan - 03/13/17 1130    Clinical Impression Statement The patient had been receiving outpatient speech therapy in [place] and has transferred to our [place] office.  The patient is presenting with anomia and will benefit from continued ST for word finding/recall and expansion in his communication.  He appears to be improving in self-cuing, but is not independent for word finding at this time.  The patient is improving in reading by reading newspapers and personal reading.  His reading fluency is good.  The patient is completing simple word finding and communication tasks, but becomes frustrated when he cannot rapidly find the word(s).  Pt benefits from a multiple hieracrchy of cues including phonemic and semantic cues and visualization to improve his word finding.    Speech Therapy Frequency 2x / week   Duration Other (comment)   Treatment/Interventions Language facilitation;Internal/external aids;SLP instruction and feedback;Multimodal communcation approach;Functional tasks;Cognitive reorganization;Oral motor exercises;Cueing hierarchy;Patient/family education;Compensatory techniques   Potential to Achieve Goals Good   Potential Considerations Ability to learn/carryover information;Cooperation/participation level;Previous level of function;Severity of impairments;Family/community support   SLP Home Exercise Plan Keep a log of memory, communication, and cognitive problems   Consulted and Agree with Plan of Care Patient      Patient will benefit from skilled therapeutic intervention in order to improve the following deficits and impairments:   Aphasia    Problem List Patient Active Problem List   Diagnosis Date Noted  . Stenosis of right carotid artery 02/01/2017  . Acute embolic stroke ([place]) 25/42/7062  . Bradycardia 11/10/2015  . Essential tremor 06/03/2015  . Dizziness 08/21/2012  . Syncope 04/10/2011  . CALF PAIN, LEFT 10/11/2010  . DYSPEPSIA 07/31/2010  . CHEST  DISCOMFORT 07/31/2010  . Hyperlipidemia 05/16/2010  . HYPERTENSION, BENIGN 05/16/2010  . CAD, NATIVE VESSEL 05/16/2010     Orinda Kenner, [place], CCC-SLP [name],[name] 03/13/2017, 11:35 AM  [place] MAIN [facility] SERVICES [address] [place], [place], [address] Phone: [phone]   Fax:  [phone]   Name: [name]. MRN: [id number] Date of Birth: [birth year]

## 2017-03-13 NOTE — Patient Instructions (Signed)
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 Pt. requires increased time, and verbal cues to use a horizontal rectilinear search pattern form left to right. Pt. is utilizing visual memory strategies. Pt. Also uses his left finger as a guide to mark his place.   Manual Therapy:

## 2017-03-13 NOTE — Therapy (Signed)
[place] MAIN [facility] SERVICES [address] [place], [place], [address] Phone: [phone]   Fax:  [phone]  Physical Therapy Treatment  Patient Details  Name: [name]. MRN: [id number] Date of Birth: 03-May-1933 Referring Provider: Launa Grill, MD  Encounter Date: 03/13/2017      PT End of Session - 03/13/17 1136    Visit Number 13   Number of Visits 17   Date for PT Re-Evaluation 03/22/17   Authorization Type 13/10   PT Start Time 1110   PT Stop Time 1150   PT Time Calculation (min) 40 min   Equipment Utilized During Treatment Gait belt   Activity Tolerance Patient tolerated treatment well;Patient limited by fatigue   Behavior During Therapy WFL for tasks assessed/performed      Past Medical History:  Diagnosis Date  . BPH (benign prostatic hyperplasia)   . Coronary artery disease   . Hypertension     Past Surgical History:  Procedure Laterality Date  . Intestinal Blockage    . SKIN CANCER EXCISION     x2  . TONSILLECTOMY      There were no vitals filed for this visit.      Subjective Assessment - 03/13/17 1125    Subjective Patient is doing fine today, no new issues. Patient has back pain 0/10.    Pertinent History Subcortical hemorrhage of left cerebral hemisphere (01/05/2017), parkinson's, [facility] Spotted Fever, Basal cell CA, squamous cell CA, DDD cervical, OA lumbar, HTN, BPH, vertigo, CAD, peripheral neuropathy   Limitations Standing;Walking;House hold activities   Patient Stated Goals Walk in community, workout at Club, treadmill, work in yard,    Currently in Pain? Yes   Pain Score 2    Pain Location Back   Pain Orientation Lower   Pain Descriptors / Indicators Aching   Pain Type Chronic pain   Pain Onset More than a month ago   Pain Frequency Intermittent   Multiple Pain Sites No      Therapeutic exercise and neuromuscular training: 1/2 foam flat side up and balance with head  turns left and right feet apart and feet together,; cues for keeping balance and using ankle and hips to maintain center of gravity tandem standing on 1/2 foam ; cues to maintain balance and posture correction standing hip abd with YTB x 20 ; cues for correct position and technique side stepping left and right in parallel bars 10 feet x 3; rotation cues for upright posture and not rotating hips towards the direction of motion step ups from floor to 6 inch stool x 20 bilateral; cues to try not to use UE and to keep correct head position leg press 1020lbs x 20 x 3, cues not to snap knees during extension and to perform slowly for max strengthening  marching in parallel bars x 20; cues to raise up knees level with therapists hands 4 square diagonals and side stepping and fwd stepping Tm walking 2.2 m/hour x 5 mins, cues for heel toe gait TM elevation 2 with walking side stepping left and right x . 4 miles / hour; cues for posture corretion Patient needs occasional verbal cueing to improve posture and cueing to correctly perform exercises slowly, holding at end of range to increase motor firing of desired muscle to encourage fatigue. Patient has fwd flexed posture and flexed knees when trying to balance on 1/2 foam  PT Education - 03/13/17 1136    Education provided Yes   Education Details saftey with stepping up and down on TM   Person(s) Educated Patient   Methods Explanation   Comprehension Verbalized understanding          PT Short Term Goals - 02/26/17 1424      PT SHORT TERM GOAL #1   Title Patient & wife demonstrate understanding of initial HEP. (Target Date: 02/22/2017)   Time 4   Period Weeks   Status Achieved     PT SHORT TERM GOAL #2   Title Cognitive Timed Up-Go <12 seconds.  (Target Date: 02/22/2017)   Baseline 12 sec   Time 4   Period Weeks   Status Achieved     PT SHORT TERM GOAL #3   Title Patient ambulates with  head turns without balance loss & maintaining path.  (Target Date: 02/22/2017)   Baseline achieved   Time 4   Period Weeks   Status Achieved     PT SHORT TERM GOAL #4   Title [place] Balance >45/56 to indicate lower fall risk.   (Target Date: 02/22/2017)   Baseline 50/56   Time 4   Status Partially Met           PT Long Term Goals - 03/05/17 1049      PT LONG TERM GOAL #1   Title Patient demonstrates with wife's cueing ongoing exercise plan. (Target Date: 03/22/2017)   Time 8   Period Weeks   Status On-going     PT LONG TERM GOAL #2   Title [place] Balance >/= 50/56 to indicate lower fall risk. (Target Date: 03/22/2017)   Baseline 50/56   Time 8   Period Weeks   Status Achieved     PT LONG TERM GOAL #3   Title Cognitive Timed Up-Go increases <25% from standard TUG to indicate lower fall risk.  (Target Date: 03/22/2017)   Baseline 12 sec   Time 8   Period Weeks   Status Achieved     PT LONG TERM GOAL #4   Title Functional Gait Assessment >20/30 indicating lower fall risk.  (Target Date: 03/22/2017)   Baseline 24   Time 8   Period Weeks   Status Achieved     PT LONG TERM GOAL #5   Title MiniBESTest >14/28 indicating lower fall risk.  (Target Date: 03/22/2017)   Baseline NT   Time 8   Period Weeks   Status On-going     Additional Long Term Goals   Additional Long Term Goals Yes     PT LONG TERM GOAL #6   Title Patient reports low back pain with standing & walking </= 3/10 (Target Date: 03/22/2017)   Baseline 2/10   Time 8   Period Weeks   Status Achieved     PT LONG TERM GOAL #7   Title Patient will improve [place] balance by 6 points indicating improved dynamic standing balance.   Baseline 50/56   Time 8   Period Weeks   Status New               Plan - 03/13/17 1137    Clinical Impression Statement Pt demonstrates difficulty with weight shifting balance and well as lack of coordination with side stepping using LLE., and toe out of RLE. He is able to  improve his sequencing with alternating toe taps and 4 square improve with increased repetition today. Pt encouraged to follow-up as scheduled. Will continue to  incorporate more balance activities into his program to work toward his goal of returning to golf   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), parkinson's, [facility] Spotted Fever, Basal cell CA, squamous cell CA, DDD cervical, OA lumbar, HTN, BPH, vertigo, CAD, peripheral neuropathy   PT Frequency 2x / week   PT Duration 8 weeks   PT Treatment/Interventions ADLs/Self Care Home Management;Gait training;Stair training;Functional mobility training;Therapeutic activities;Therapeutic exercise;Balance training;Neuromuscular re-education;Patient/family education;Ultrasound;Moist Heat;Manual techniques   PT Next Visit Plan Work on walking speed, endurance on treadmill, gait outside and on compliant surfaces, PWR exercises   Consulted and Agree with Plan of Care Patient      Patient will benefit from skilled therapeutic intervention in order to improve the following deficits and impairments:  Abnormal gait, Decreased activity tolerance, Decreased balance, Decreased mobility, Decreased strength, Pain  Visit Diagnosis: Muscle weakness (generalized)  Hemianopsia  Other lack of coordination  Unsteadiness on feet     Problem List Patient Active Problem List   Diagnosis Date Noted  . Stenosis of right carotid artery 02/01/2017  . Acute embolic stroke ([place]) 04/12/210  . Bradycardia 11/10/2015  . Essential tremor 06/03/2015  . Dizziness 08/21/2012  . Syncope 04/10/2011  . CALF PAIN, LEFT 10/11/2010  . DYSPEPSIA 07/31/2010  . CHEST DISCOMFORT 07/31/2010  . Hyperlipidemia 05/16/2010  . HYPERTENSION, BENIGN 05/16/2010  . CAD, NATIVE VESSEL 05/16/2010   Alanson Puls, PT, DPT Jenner S 03/13/2017, 11:42 AM  [place] MAIN [facility] SERVICES [address] [place], [place], [address] Phone: [phone]   Fax:  [phone]  Name: Calvin Pfarr [facility]. MRN: [id number] Date of Birth: 11/05/1933

## 2017-03-13 NOTE — Therapy (Signed)
[place] MAIN [facility] SERVICES [address] [place], [place], [address] Phone: [phone]   Fax:  [phone]  Occupational Therapy Treatment  Patient Details  Name: [name]. MRN: [id number] Date of Birth: 1933/04/13 Referring Provider: Dr. Rea College  Encounter Date: 03/13/2017      OT End of Session - 03/13/17 0926    Visit Number 11   Number of Visits 16   Date for OT Re-Evaluation 03/21/17   Authorization Type medicare G-code 1   Activity Tolerance Patient tolerated treatment well   Behavior During Therapy [facility] for tasks assessed/performed      Past Medical History:  Diagnosis Date  . BPH (benign prostatic hyperplasia)   . Coronary artery disease   . Hypertension     Past Surgical History:  Procedure Laterality Date  . Intestinal Blockage    . SKIN CANCER EXCISION     x2  . TONSILLECTOMY      There were no vitals filed for this visit.      Subjective Assessment - 03/13/17 0924    Subjective  Pt. reports he is doing well. Pt. rpeorts he doesnt know that his vision is improving.   Patient is accompained by: Family member   Patient Stated Goals I want to get well and be perfect again.  I hate these questions (pt unable to give specific answer)   Currently in Pain? No/denies                      OT Treatments/Exercises (OP) - 03/13/17 6812      ADLs   ADL Comments Pt. 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. Pt. requires increased time, and verbal cues to use a horizontal rectilinear search pattern form left to right. Pt. is utilizing visual memory strategies. Pt. Also uses his left finger as a guide to mark his place.                 OT Education - 03/13/17 0925    Education provided Yes   Education Details Visual scanning, compensatory strategies   Methods Explanation   Comprehension Verbalized understanding          OT Short Term Goals - 02/14/17 1457      OT SHORT TERM  GOAL #1   Title Pt and wife will be mod I with HEP - 02/21/2017   Time 4   Period Weeks   Status On-going  needs reinforcement of coordination HEP     OT SHORT TERM GOAL #2   Title Pt will be mod I with shower transfers   Status Achieved  met per pt report     OT SHORT TERM GOAL #3   Title Pt will be mod I with bathing   Status Achieved     OT SHORT TERM GOAL #4   Title Pt will complete table top scanning activity (not lanaguage or letter based) with 75% accuracy   Status Achieved  met for copying small peg design with min v.c.     OT SHORT TERM GOAL #5   Title Pt will attend to familiar task in busy environment with no more than min vc's.     Status Achieved           OT Long Term Goals - 03/11/17 0957      OT LONG TERM GOAL #1   Title Pt and wife will be mod I with ugraded HEP prn - 03/21/2017  Time 8   Period Weeks   Status On-going     OT LONG TERM GOAL #2   Title Pt will demonstrate improved coordinaton by decreasing time on 9 hole peg test by 4 seconds (baseline=34.15)   Time 8   Period Weeks   Status Achieved     OT LONG TERM GOAL #3   Title Pt will be able to print/write name legibly AE prn- (revised goal) Pt will demonstrate ability to write a 4 sentence paragraph with 100% legibility, and no significant decr. in letter size using AE PRN.   Time 8   Period Weeks   Status Achieved     OT LONG TERM GOAL #4   Title Pt will demonstate ability to maintain balance on outdoor uneven surfaces in preparation for returning to simple yard work.    Time 8   Period Weeks   Status On-going     OT LONG TERM GOAL #5   Title Pt will require no more than 2 vc's to recall information from therapy session   Time 8   Period Weeks   Status On-going     OT LONG TERM GOAL #6   Title Pt will complete table top scanning activity (not language based) with 90% accuracy   Time 8   Period Weeks   Status On-going     OT LONG TERM GOAL #7   Title Pt will complete  environmental scanning task with at least 75% accuracy.  Revised to 90% accuracy 02/14/17   Time 8   Period Weeks   Status On-going     OT LONG TERM GOAL #8   Title Pt will perform simple divided attention with environmental scanning for improved safety for ambulation/in prep for driving with at least 75% accuracy.   Period Weeks   Status New               Plan - 03/13/17 6160    Clinical Impression Statement Pt. reports he is now getting better at faking what's wrong with him at home, so that he can get by so people won't know what is wrong with him. Pt. conitnues to require work on improving right sided visual scanning, and compensatory techniques during ADLs.   Rehab Potential Good   OT Frequency 2x / week   OT Duration 8 weeks   OT Treatment/Interventions Self-care/ADL training;Electrical Stimulation;Therapeutic exercise;Neuromuscular education;DME and/or AE instruction;Energy conservation;Therapist, nutritional;Therapeutic activities;Cognitive remediation/compensation;Visual/perceptual remediation/compensation;Patient/family education;Balance training   Consulted and Agree with Plan of Care Patient   Family Member Consulted wife [name]      Patient will benefit from skilled therapeutic intervention in order to improve the following deficits and impairments:  Decreased balance, Decreased cognition, Decreased coordination, Decreased safety awareness, Decreased mobility, Decreased knowledge of use of DME, Impaired UE functional use, Impaired vision/preception  Visit Diagnosis: Muscle weakness (generalized)    Problem List Patient Active Problem List   Diagnosis Date Noted  . Stenosis of right carotid artery 02/01/2017  . Acute embolic stroke ([place]) 73/71/0626  . Bradycardia 11/10/2015  . Essential tremor 06/03/2015  . Dizziness 08/21/2012  . Syncope 04/10/2011  . CALF PAIN, LEFT 10/11/2010  . DYSPEPSIA 07/31/2010  . CHEST DISCOMFORT 07/31/2010  . Hyperlipidemia  05/16/2010  . HYPERTENSION, BENIGN 05/16/2010  . CAD, NATIVE VESSEL 05/16/2010    Harrel Carina, MS, OTR/L 03/13/2017, 10:07 AM  [place] MAIN [facility] SERVICES [address] [place], [place], [address] Phone: [phone]   Fax:  [phone]  Name: [name] [facility]. MRN: [id number] Date of Birth: 18-Oct-1933

## 2017-03-18 ENCOUNTER — Ambulatory Visit: Payer: Medicare Other | Admitting: Physical Therapy

## 2017-03-18 ENCOUNTER — Ambulatory Visit: Payer: Medicare Other | Admitting: Occupational Therapy

## 2017-03-18 ENCOUNTER — Encounter: Payer: Medicare Other | Admitting: Speech Pathology

## 2017-03-18 ENCOUNTER — Ambulatory Visit: Payer: Medicare Other | Admitting: Speech Pathology

## 2017-03-18 ENCOUNTER — Encounter: Payer: Self-pay | Admitting: Speech Pathology

## 2017-03-18 ENCOUNTER — Encounter: Payer: Medicare Other | Admitting: Occupational Therapy

## 2017-03-18 ENCOUNTER — Encounter: Payer: Self-pay | Admitting: Physical Therapy

## 2017-03-18 DIAGNOSIS — H5347 Heteronymous bilateral field defects: Secondary | ICD-10-CM | POA: Diagnosis not present

## 2017-03-18 DIAGNOSIS — R2681 Unsteadiness on feet: Secondary | ICD-10-CM

## 2017-03-18 DIAGNOSIS — R278 Other lack of coordination: Secondary | ICD-10-CM

## 2017-03-18 DIAGNOSIS — M6281 Muscle weakness (generalized): Secondary | ICD-10-CM

## 2017-03-18 DIAGNOSIS — R4701 Aphasia: Secondary | ICD-10-CM

## 2017-03-18 NOTE — Patient Instructions (Signed)
OT TREATMENT     Neuro muscular re-education:  Therapeutic Exercise:  Selfcare:  Pt. scored a 57 sum score on the MAM-20. Pt. identified tasks including: buttoning, cutting meat, writing 3-4 lines legibly, taking items out of a wallet, picking up a 1/2 full pitcher, squeezing toothpaste, and keying in telephone numbers as very hard to do. Pt. identified clipping nails, opening medicine bottles, opening wide mouthed jars, handling/counting money, turning a key, brushing hair, and brushing teeth as a little hard to do.  Pt. worked on [employer] scanning, and visual search strategies for near space tasks in preparation for reading, and writing. Pt. requires verbal cues, and increased time. Pt. missed multiple items on the right.  Manual Therapy:  Therapeutic Activity:  Pt. worked on visual scanning and visual search strategies using cards placed in random patterns at the tabletop. Pt. required cues, and increased assistance to locate the cards.

## 2017-03-18 NOTE — Therapy (Signed)
[place] MAIN [facility] SERVICES [address] [place], [place], [address] Phone: [phone]   Fax:  [phone]  Occupational Therapy Treatment  Patient Details  Name: [name]. MRN: [id number] Date of Birth: 11/10/1933 Referring Provider: Dr. Rea College  Encounter Date: 03/18/2017      OT End of Session - 03/18/17 0925    Visit Number 12   Number of Visits 16   Date for OT Re-Evaluation 03/21/17   Authorization Type medicare G-code 2   OT Start Time 0915   OT Stop Time 1000   OT Time Calculation (min) 45 min   Activity Tolerance Patient tolerated treatment well   Behavior During Therapy [facility] for tasks assessed/performed      Past Medical History:  Diagnosis Date  . BPH (benign prostatic hyperplasia)   . Coronary artery disease   . Hypertension     Past Surgical History:  Procedure Laterality Date  . Intestinal Blockage    . SKIN CANCER EXCISION     x2  . TONSILLECTOMY      There were no vitals filed for this visit.      Subjective Assessment - 03/18/17 0922    Subjective  Pt. reports having a follow-up eye appointment next week.   Patient is accompained by: Family member   Pertinent History see epic.  L CVA, R hemiopsia, Parkinson's, essential tremor   Patient Stated Goals I want to get well and be perfect again.  I hate these questions (pt unable to give specific answer)   Currently in Pain? No/denies                      OT Treatments/Exercises (OP) - 03/18/17 4403      Visual/Perceptual Exercises   Other Exercises Pt. scored a 57 sum score on the MAM-20. Pt. identified tasks including: buttoning, cutting meat, writing 3-4 lines legibly, taking items out of a wallet, picking up a 1/2 full pitcher, squeezing toothpaste, and keying in telephone numbers as very hard to do. Pt. identified clipping nails, opening medicine bottles, opening wide mouthed jars, handling/counting money, turning a  key, brushing hair, and brushing teeth as a little hard to do.  Pt. worked on [employer] scanning, and visual search strategies for near space tasks in preparation for reading, and writing. Pt. requires verbal cues, and increased time. Pt. Missed multiple items on the right.     Neurological Re-education Exercises   Other Exercises 1 Pt. worked on visual scanning and visual search strategies using cards placed in random patterns at the tabletop. Pt. required cues, and increased assistance to locate the cards. Pt. was able to efficiently identify the cards, and add multiple cards when called out with 100% accuracy.                OT Education - 03/18/17 [id number]    Education provided Yes   Education Details visual compensatory strategies for ADLs, and IADLs.   Person(s) Educated Patient   Methods Explanation   Comprehension Verbalized understanding          OT Short Term Goals - 02/14/17 1457      OT SHORT TERM GOAL #1   Title Pt and wife will be mod I with HEP - 02/21/2017   Time 4   Period Weeks   Status On-going  needs reinforcement of coordination HEP     OT SHORT TERM GOAL #2   Title Pt will be  mod I with shower transfers   Status Achieved  met per pt report     OT SHORT TERM GOAL #3   Title Pt will be mod I with bathing   Status Achieved     OT SHORT TERM GOAL #4   Title Pt will complete table top scanning activity (not lanaguage or letter based) with 75% accuracy   Status Achieved  met for copying small peg design with min v.c.     OT SHORT TERM GOAL #5   Title Pt will attend to familiar task in busy environment with no more than min vc's.     Status Achieved           OT Long Term Goals - 03/11/17 0957      OT LONG TERM GOAL #1   Title Pt and wife will be mod I with ugraded HEP prn - 03/21/2017   Time 8   Period Weeks   Status On-going     OT LONG TERM GOAL #2   Title Pt will demonstrate improved coordinaton by decreasing time on 9 hole peg  test by 4 seconds (baseline=34.15)   Time 8   Period Weeks   Status Achieved     OT LONG TERM GOAL #3   Title Pt will be able to print/write name legibly AE prn- (revised goal) Pt will demonstrate ability to write a 4 sentence paragraph with 100% legibility, and no significant decr. in letter size using AE PRN.   Time 8   Period Weeks   Status Achieved     OT LONG TERM GOAL #4   Title Pt will demonstate ability to maintain balance on outdoor uneven surfaces in preparation for returning to simple yard work.    Time 8   Period Weeks   Status On-going     OT LONG TERM GOAL #5   Title Pt will require no more than 2 vc's to recall information from therapy session   Time 8   Period Weeks   Status On-going     OT LONG TERM GOAL #6   Title Pt will complete table top scanning activity (not language based) with 90% accuracy   Time 8   Period Weeks   Status On-going     OT LONG TERM GOAL #7   Title Pt will complete environmental scanning task with at least 75% accuracy.  Revised to 90% accuracy 02/14/17   Time 8   Period Weeks   Status On-going     OT LONG TERM GOAL #8   Title Pt will perform simple divided attention with environmental scanning for improved safety for ambulation/in prep for driving with at least 75% accuracy.   Period Weeks   Status New               Plan - 03/18/17 0926    Clinical Impression Statement Pt. continues to work on improving visual scanning, and visual search strategies for near space during tasks at tabletop, and extrapersonal space within his environment during ADLs, and IADLs. Pt. requires verbal cues, and increased time to complete.   Rehab Potential Good   OT Frequency 2x / week   OT Duration 8 weeks   OT Treatment/Interventions Self-care/ADL training;Electrical Stimulation;Therapeutic exercise;Neuromuscular education;DME and/or AE instruction;Energy conservation;Therapist, nutritional;Therapeutic activities;Cognitive  remediation/compensation;Visual/perceptual remediation/compensation;Patient/family education;Balance training   Consulted and Agree with Plan of Care Patient      Patient will benefit from skilled therapeutic intervention in order to improve the following deficits and  impairments:  Decreased balance, Decreased cognition, Decreased coordination, Decreased safety awareness, Decreased mobility, Decreased knowledge of use of DME, Impaired UE functional use, Impaired vision/preception  Visit Diagnosis: Muscle weakness (generalized)    Problem List Patient Active Problem List   Diagnosis Date Noted  . Stenosis of right carotid artery 02/01/2017  . Acute embolic stroke ([place]) 00/37/0488  . Bradycardia 11/10/2015  . Essential tremor 06/03/2015  . Dizziness 08/21/2012  . Syncope 04/10/2011  . CALF PAIN, LEFT 10/11/2010  . DYSPEPSIA 07/31/2010  . CHEST DISCOMFORT 07/31/2010  . Hyperlipidemia 05/16/2010  . HYPERTENSION, BENIGN 05/16/2010  . CAD, NATIVE VESSEL 05/16/2010    Harrel Carina, MS, OTR/L 03/18/2017, 10:07 AM  [place] MAIN [facility] SERVICES [address] [place], [place], [address] Phone: [phone]   Fax:  [phone]  Name: [name] [facility]. MRN: [id number] Date of Birth: 1933/10/13

## 2017-03-18 NOTE — Therapy (Signed)
[place] MAIN [facility] SERVICES [address] [place], [place], [address] Phone: [phone]   Fax:  [phone]  Physical Therapy Treatment  Patient Details  Name: [name]. MRN: [id number] Date of Birth: 07-02-1933 Referring Provider: Launa Grill, MD  Encounter Date: 03/18/2017      PT End of Session - 03/18/17 1126    Visit Number 16   Number of Visits 17   Date for PT Re-Evaluation 03/22/17   Authorization Type 16/10   PT Start Time 1103   PT Stop Time 1145   PT Time Calculation (min) 42 min   Equipment Utilized During Treatment Gait belt   Activity Tolerance Patient tolerated treatment well;Patient limited by fatigue   Behavior During Therapy WFL for tasks assessed/performed      Past Medical History:  Diagnosis Date  . BPH (benign prostatic hyperplasia)   . Coronary artery disease   . Hypertension     Past Surgical History:  Procedure Laterality Date  . Intestinal Blockage    . SKIN CANCER EXCISION     x2  . TONSILLECTOMY      There were no vitals filed for this visit.      Subjective Assessment - 03/18/17 1126    Subjective Patient is doing fine today, no new issues. Patient has back pain 0/10.    Patient is accompained by: Family member   Pertinent History Subcortical hemorrhage of left cerebral hemisphere (01/05/2017), parkinson's, [facility] Spotted Fever, Basal cell CA, squamous cell CA, DDD cervical, OA lumbar, HTN, BPH, vertigo, CAD, peripheral neuropathy   Limitations Standing;Walking;House hold activities   Patient Stated Goals Walk in community, workout at Club, treadmill, work in yard,    Currently in Pain? No/denies   Pain Score 0-No pain   Pain Onset More than a month ago   Multiple Pain Sites No      Therapeutic Exercise:   Eccentric step downs x 10 BLE; cues to not put too much weight bearing on UE's Squats x 10 with 5 sec hold, cues to maintain erect position Heel raises x 10  x 2 ; cues to not rock forward Resisted side-steeping RTB 4 lengths x 2;; cues to not rotate trunk towards direction of mobility Standing mini squats 2 x 10 with RTB around knees to encourage abduction; cues to maintain correct erect posture Step-ups to 6" step x 10 bilateral; cues for getting entire foot on step Quantum leg press 100 # x 20 x 3; cues for not snapping LE in extension and performing slowly 3 way hip with YTB and cues for trunk position and technique Matrix 12.5 lbs forward/ backward, side stepping left and right and maintain erect posture Resisted walking fwd, retro, bil side stepping with12.5# x 3 laps each; min A throughout due to pt being unsteady and losing balance multiple times throughout each direction; increased time to complete task  Neuromuscular training:  Side stepping on blue  foam balance beam x 5 lengths of the parallel bars with posture correction cues 4 square fwd/bwd, side to side stepping/ diagonal stepping, cues to not step on the lines Standing on foam NBOS  sorting balls/ cone  Reaching across midline , cues for  proper technique of exercises, slow eccentric contractions to target specific muscles and facility increased muscle building. Therapeutic rest breaks for energy conservation                        PT  Education - 03/18/17 1126    Education provided Yes   Education Details use of hand rail for getting on and off TM   Person(s) Educated Patient   Methods Explanation   Comprehension Verbalized understanding          PT Short Term Goals - 02/26/17 1424      PT SHORT TERM GOAL #1   Title Patient & wife demonstrate understanding of initial HEP. (Target Date: 02/22/2017)   Time 4   Period Weeks   Status Achieved     PT SHORT TERM GOAL #2   Title Cognitive Timed Up-Go <12 seconds.  (Target Date: 02/22/2017)   Baseline 12 sec   Time 4   Period Weeks   Status Achieved     PT SHORT TERM GOAL #3   Title Patient ambulates  with head turns without balance loss & maintaining path.  (Target Date: 02/22/2017)   Baseline achieved   Time 4   Period Weeks   Status Achieved     PT SHORT TERM GOAL #4   Title Berg Balance >45/56 to indicate lower fall risk.   (Target Date: 02/22/2017)   Baseline 50/56   Time 4   Status Partially Met           PT Long Term Goals - 03/05/17 1049      PT LONG TERM GOAL #1   Title Patient demonstrates with wife's cueing ongoing exercise plan. (Target Date: 03/22/2017)   Time 8   Period Weeks   Status On-going     PT LONG TERM GOAL #2   Title Berg Balance >/= 50/56 to indicate lower fall risk. (Target Date: 03/22/2017)   Baseline 50/56   Time 8   Period Weeks   Status Achieved     PT LONG TERM GOAL #3   Title Cognitive Timed Up-Go increases <25% from standard TUG to indicate lower fall risk.  (Target Date: 03/22/2017)   Baseline 12 sec   Time 8   Period Weeks   Status Achieved     PT LONG TERM GOAL #4   Title Functional Gait Assessment >20/30 indicating lower fall risk.  (Target Date: 03/22/2017)   Baseline 24   Time 8   Period Weeks   Status Achieved     PT LONG TERM GOAL #5   Title MiniBESTest >14/28 indicating lower fall risk.  (Target Date: 03/22/2017)   Baseline NT   Time 8   Period Weeks   Status On-going     Additional Long Term Goals   Additional Long Term Goals Yes     PT LONG TERM GOAL #6   Title Patient reports low back pain with standing & walking </= 3/10 (Target Date: 03/22/2017)   Baseline 2/10   Time 8   Period Weeks   Status Achieved     PT LONG TERM GOAL #7   Title Patient will improve Berg balance by 6 points indicating improved dynamic standing balance.   Baseline 50/56   Time 8   Period Weeks   Status New               Plan - 03/18/17 1127    Clinical Impression Statement Patient required min verbal cueing during matrix machine stepping, and required CGA during all dynamic standing balance activities. Patient required  occasional rest breaks between exercises due to fatigue. Patient tolerated exercise well with no reports of back pain. Patient will continue to benefit from skilled therapy in order to improve  dynamic standing balance activities and increase gait speed to reduce  risk for falls   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), parkinson's, [facility] Spotted Fever, Basal cell CA, squamous cell CA, DDD cervical, OA lumbar, HTN, BPH, vertigo, CAD, peripheral neuropathy   PT Frequency 2x / week   PT Duration 8 weeks   PT Treatment/Interventions ADLs/Self Care Home Management;Gait training;Stair training;Functional mobility training;Therapeutic activities;Therapeutic exercise;Balance training;Neuromuscular re-education;Patient/family education;Ultrasound;Moist Heat;Manual techniques   PT Next Visit Plan Work on walking speed, endurance on treadmill, gait outside and on compliant surfaces, PWR exercises   Consulted and Agree with Plan of Care Patient      Patient will benefit from skilled therapeutic intervention in order to improve the following deficits and impairments:  Abnormal gait, Decreased activity tolerance, Decreased balance, Decreased mobility, Decreased strength, Pain  Visit Diagnosis: Muscle weakness (generalized)  Hemianopsia  Other lack of coordination  Unsteadiness on feet     Problem List Patient Active Problem List   Diagnosis Date Noted  . Stenosis of right carotid artery 02/01/2017  . Acute embolic stroke ([place]) 80/69/9967  . Bradycardia 11/10/2015  . Essential tremor 06/03/2015  . Dizziness 08/21/2012  . Syncope 04/10/2011  . CALF PAIN, LEFT 10/11/2010  . DYSPEPSIA 07/31/2010  . CHEST DISCOMFORT 07/31/2010  . Hyperlipidemia 05/16/2010  . HYPERTENSION, BENIGN 05/16/2010  . CAD, NATIVE VESSEL 05/16/2010   Alanson Puls, PT, DPT [place], [place] S 03/18/2017, 12:12  PM  [place] MAIN [facility] SERVICES [address] [place], [place], [address] Phone: [phone]   Fax:  [phone]  Name: [name] [facility]. MRN: [id number] Date of Birth: 1933-06-07

## 2017-03-18 NOTE — Therapy (Signed)
[place] MAIN [facility] SERVICES [address] [place], [place], [address] Phone: [phone]   Fax:  [phone]  Speech Language Pathology Treatment  Patient Details  Name: [name]. MRN: [id number] Date of Birth: Oct 01, 1933 Referring Provider: Launa Grill, MD  Encounter Date: 03/18/2017      End of Session - 03/18/17 1229    Visit Number 13   Number of Visits 17   Date for SLP Re-Evaluation 03/29/17   SLP Start Time 1025   SLP Stop Time  1058   SLP Time Calculation (min) 56 min   Activity Tolerance Patient tolerated treatment well      Past Medical History:  Diagnosis Date  . BPH (benign prostatic hyperplasia)   . Coronary artery disease   . Hypertension     Past Surgical History:  Procedure Laterality Date  . Intestinal Blockage    . SKIN CANCER EXCISION     x2  . TONSILLECTOMY      There were no vitals filed for this visit.      Subjective Assessment - 03/18/17 1227    Subjective Pt was cooperative with treatment and agreeable to treatment session with different therapist.    Currently in Pain? No/denies   Pain Score 0-No pain               ADULT SLP TREATMENT - 03/18/17 0001      General Information   Behavior/Cognition Alert;Cooperative;Pleasant mood     Treatment Provided   Treatment provided Cognitive-Linquistic     Pain Assessment   Pain Assessment No/denies pain     Cognitive-Linquistic Treatment   Treatment focused on Aphasia   Skilled Treatment WORD FINDING: Pt unscrambled sentences given 4-7 words with 95% acc independently. Pt responded to open ended wh- questions re: general information with 80% acc independently; pt improved to more in depth responses when given min verbal cues. Pt completed sentences by providing missing words 6/6 with moderate verbal cues. Pt demonstrated more difficulty with this task today, perhaps d/t more open-ended nature of responses required. Pt  participated in conversation with ST and demonstrated word finding strategies such as description with min verbal cues.      Assessment / Recommendations / Plan   Plan Continue with current plan of care     Progression Toward Goals   Progression toward goals Progressing toward goals          SLP Education - 03/18/17 1228    Education provided Yes   Education Details Word finding strategies and HEP   Person(s) Educated Patient   Methods Explanation   Comprehension Verbalized understanding          SLP Short Term Goals - 02/14/17 1435      SLP SHORT TERM GOAL #1   Title pt will name average 10 items in concrete category in 60 seconds, over two sessions   Time 2   Period Weeks   Status On-going     SLP SHORT TERM GOAL #2   Title pt will demo compensations for expressive language in functional 7 minute mod complex conversation    Time 2   Period Weeks   Status On-going     SLP SHORT TERM GOAL #3   Title pt will undergo assessment of reading in first 3 therapy sessions   Status Achieved     SLP SHORT TERM GOAL #4   Title pt will read sentences of 10-12 words and demo comprehension at 90%  success   Time 2   Period Weeks   Status On-going          SLP Long Term Goals - 03/07/17 1451      SLP LONG TERM GOAL #1   Title pt will use compensations for functional 10 minute mod-max complex conversation   Time 6   Period Weeks   Status Partially Met     SLP LONG TERM GOAL #2   Title  pt will demo [facility] tasks requiring memory compensations (meds, appointments, etc)   Time 6   Period Weeks   Status Achieved     SLP LONG TERM GOAL #3   Title pt will demo functional reading comprehension in 3-4 sentence paragraphs   Time 6   Period Weeks   Status Achieved     SLP LONG TERM GOAL #4   Title Patient will generate grammatical and meaningful sentences to complete moderate cognitive-linguistic task with 80% accruacy.   Time 6   Period Weeks   Status New     SLP LONG  TERM GOAL #5   Title Patient will complete high level word finding activities with 80% accuracy.   Time 6   Period Weeks   Status New          Plan - 03/18/17 1230    Clinical Impression Statement The patient had been receiving outpatient speech therapy in [place] and has transferred to our [place] office.  The patient continues to  present with anomia and will benefit from continued ST for word finding/recall and expansion in his communication.  Pt is improving in self-cuing, but is not independent for word finding at this time.  The patient is completing simple word finding and communication tasks, but becomes frustrated when he cannot rapidly find the word(s).  Pt benefits from a multiple hieracrchy of cues including phonemic and semantic cues and visualization to improve his word finding.    Speech Therapy Frequency 2x / week   Duration Other (comment)  3 weeks   Treatment/Interventions Language facilitation;Internal/external aids;SLP instruction and feedback;Multimodal communcation approach;Functional tasks;Cognitive reorganization;Oral motor exercises;Cueing hierarchy;Patient/family education;Compensatory techniques   Potential to Achieve Goals Good   Potential Considerations Ability to learn/carryover information;Cooperation/participation level;Previous level of function;Severity of impairments;Family/community support   SLP Home Exercise Plan Word finding homework   Consulted and Agree with Plan of Care Patient      Patient will benefit from skilled therapeutic intervention in order to improve the following deficits and impairments:   Aphasia    Problem List Patient Active Problem List   Diagnosis Date Noted  . Stenosis of right carotid artery 02/01/2017  . Acute embolic stroke ([place]) 08/25/3006  . Bradycardia 11/10/2015  . Essential tremor 06/03/2015  . Dizziness 08/21/2012  . Syncope 04/10/2011  . CALF PAIN, LEFT 10/11/2010  . DYSPEPSIA 07/31/2010  . CHEST  DISCOMFORT 07/31/2010  . Hyperlipidemia 05/16/2010  . HYPERTENSION, BENIGN 05/16/2010  . CAD, NATIVE VESSEL 05/16/2010    Northwood,Rawan Riendeau 03/18/2017, 12:36 PM  [place] MAIN [facility] SERVICES [address] [place], [place], [address] Phone: [phone]   Fax:  [phone]   Name: [name] [facility]. MRN: [id number] Date of Birth: 1933-10-04

## 2017-03-19 ENCOUNTER — Ambulatory Visit: Payer: Medicare Other | Admitting: Occupational Therapy

## 2017-03-19 DIAGNOSIS — H5347 Heteronymous bilateral field defects: Secondary | ICD-10-CM | POA: Diagnosis not present

## 2017-03-19 NOTE — Therapy (Signed)
[place] [facility] MAIN [facility] SERVICES [address] [place], [place], [address] Phone: [phone]   Fax:  [phone]  Occupational Therapy Progress/Recertification Report  Patient Details  Name: [name]. MRN: [id number] Date of Birth: 1933/03/21 Referring Provider: Dr. Wilfrid Lund  Encounter Date: 03/19/2017      OT End of Session - 03/19/17 1056    Visit Number 13   Number of Visits 16   Date for OT Re-Evaluation 05/14/17   Authorization Type medicare G-code 3   OT Start Time 1045   OT Stop Time 1130   OT Time Calculation (min) 45 min   Activity Tolerance Patient tolerated treatment well   Behavior During Therapy [facility] for tasks assessed/performed      Past Medical History:  Diagnosis Date  . BPH (benign prostatic hyperplasia)   . Coronary artery disease   . Hypertension     Past Surgical History:  Procedure Laterality Date  . Intestinal Blockage    . SKIN CANCER EXCISION     x2  . TONSILLECTOMY      There were no vitals filed for this visit.      Subjective Assessment - 03/19/17 1054    Subjective  Pt. reports his follow-up eye appointmnet is not next week, it is actually in June.   Patient is accompained by: Family member   Pertinent History see epic.  L CVA, R hemiopsia, Parkinson's, essential tremor   Patient Stated Goals I want to get well and be perfect again.  I hate these questions (pt unable to give specific answer)   Currently in Pain? No/denies                      OT Treatments/Exercises (OP) - 03/19/17 1109      Visual/Perceptual Exercises   Other Exercises Pt. worked on word search in 71M print using visual search strategies, visual scanning, and compensatory strategies for near space, tabletop tasks in preparation for reading, and writing. Verbal cues, and increased time was required.                OT Education - 03/19/17 1055    Education provided Yes   Education  Details Visual scannning, visual search strategies   Person(s) Educated Patient   Methods Explanation   Comprehension Verbalized understanding            OT Long Term Goals - 03/19/17 1114      OT LONG TERM GOAL #1   Title Pt and wife will be mod I with ugraded HEP prn - 03/21/2017   Time 8   Period Weeks   Status On-going     OT LONG TERM GOAL #2   Title Pt will demonstrate improved coordinaton by decreasing time on 9 hole peg test by 4 seconds (baseline=34.15)   Time 8   Period Weeks   Status Achieved     OT LONG TERM GOAL #3   Title Pt will be able to print/write name legibly AE prn- (revised goal) Pt will demonstrate ability to write a 4 sentence paragraph with 100% legibility, and no significant decr. in letter size using AE PRN.   Time 8   Period Weeks   Status Achieved     OT LONG TERM GOAL #4   Title Pt will demonstate ability to maintain balance on outdoor uneven surfaces in preparation for returning to simple yard work.    Time 8   Period Weeks  Status Deferred     OT LONG TERM GOAL #5   Title Pt will require no more than 2 vc's to recall information from therapy session   Time 8   Period Weeks   Status On-going     OT LONG TERM GOAL #6   Title Pt will complete table top scanning activity (not language based) with 90% accuracy   Time 8   Period Weeks   Status On-going     OT LONG TERM GOAL #7   Title Pt will complete environmental scanning task with at least 75% accuracy.  Revised to 90% accuracy 02/14/17   Time 8   Period Weeks   Status On-going     OT LONG TERM GOAL #8   Title Pt will perform simple divided attention with environmental scanning for improved safety for ambulation/in prep for driving with at least 16% accuracy.   Baseline Pt. will demonstrate visual compensatory strategies 100% of the time during ADLs, and IADLs.    Time 8   Period Weeks   Status New               Plan - 2017/03/24 1057    Clinical Impression Statement  Pt. continues to present with limited vision. Pt. requires verbal cues for right visual field. Pt. has multiple ommissions on tabletop vsual scanning tasks. Goals were reviewed, and modified with pt. Pt. continues to benenfit from skilled OT services to work on improving visual compensatory strategies, visual scanning, and visual search strateiges during ADLs, and IADLs.    Rehab Potential Good   OT Frequency 2x / week   OT Duration 8 weeks   OT Treatment/Interventions Self-care/ADL training;Electrical Stimulation;Therapeutic exercise;Neuromuscular education;DME and/or AE instruction;Energy conservation;Building services engineer;Therapeutic activities;Cognitive remediation/compensation;Visual/perceptual remediation/compensation;Patient/family education;Balance training   Consulted and Agree with Plan of Care Patient   Family Member Consulted wife Dois Davenport      Patient will benefit from skilled therapeutic intervention in order to improve the following deficits and impairments:  Decreased balance, Decreased cognition, Decreased coordination, Decreased safety awareness, Decreased mobility, Decreased knowledge of use of DME, Impaired UE functional use, Impaired vision/preception  Visit Diagnosis: Hemianopsia - Plan: Ot plan of care cert/re-cert      G-Codes - 03-24-2017 1124    Functional Assessment Tool Used (Outpatient only) skilled clinical observation   Functional Limitation Self care   Self Care Current Status [phone]) At least 20 percent but less than 40 percent impaired, limited or restricted   Self Care Goal Status ([id number]) At least 1 percent but less than 20 percent impaired, limited or restricted      Problem List Patient Active Problem List   Diagnosis Date Noted  . Stenosis of right carotid artery 02/01/2017  . Acute embolic stroke ([facility]) 02/01/2017  . Bradycardia 11/10/2015  . Essential tremor 06/03/2015  . Dizziness 08/21/2012  . Syncope 04/10/2011  . CALF PAIN, LEFT  10/11/2010  . DYSPEPSIA 07/31/2010  . CHEST DISCOMFORT 07/31/2010  . Hyperlipidemia 05/16/2010  . HYPERTENSION, BENIGN 05/16/2010  . CAD, NATIVE VESSEL 05/16/2010    Olegario Messier, MS, OTR/L 2017-03-24, 11:28 AM  [place] [facility] MAIN [facility] SERVICES [address] [place], [place], [address] Phone: [phone]   Fax:  [phone]  Name: Barrett Goldie [facility]. MRN: [id number] Date of Birth: [birth year]

## 2017-03-19 NOTE — Patient Instructions (Signed)
OT TREATMENT     Neuro muscular re-education:  Therapeutic Exercise:  Selfcare:  Pt. worked on Armed forces logistics/support/administrative officer in 58M print using visual search strategies, visual scanning, and compensatory strategies for near space, tabletop tasks in preparation for reading, and writing. Verbal cues, and increased time was required.  Manual Therapy:

## 2017-03-20 ENCOUNTER — Encounter: Payer: Self-pay | Admitting: Physical Therapy

## 2017-03-20 ENCOUNTER — Encounter: Payer: Medicare Other | Admitting: Occupational Therapy

## 2017-03-20 ENCOUNTER — Encounter: Payer: Self-pay | Admitting: Speech Pathology

## 2017-03-20 ENCOUNTER — Ambulatory Visit: Payer: Medicare Other | Attending: Physical Medicine and Rehabilitation | Admitting: Physical Therapy

## 2017-03-20 ENCOUNTER — Ambulatory Visit: Payer: Medicare Other | Admitting: Speech Pathology

## 2017-03-20 ENCOUNTER — Ambulatory Visit: Payer: Medicare Other | Admitting: Physical Therapy

## 2017-03-20 DIAGNOSIS — H5347 Heteronymous bilateral field defects: Secondary | ICD-10-CM

## 2017-03-20 DIAGNOSIS — R2681 Unsteadiness on feet: Secondary | ICD-10-CM

## 2017-03-20 DIAGNOSIS — M6281 Muscle weakness (generalized): Secondary | ICD-10-CM

## 2017-03-20 DIAGNOSIS — R278 Other lack of coordination: Secondary | ICD-10-CM

## 2017-03-20 DIAGNOSIS — R4701 Aphasia: Secondary | ICD-10-CM

## 2017-03-20 NOTE — Therapy (Signed)
[place] MAIN [facility] SERVICES [address] [place], [place], [address] Phone: [phone]   Fax:  [phone]  Physical Therapy Treatment  Patient Details  Name: [name]. MRN: [id number] Date of Birth: [birth year] Referring Provider: Launa Grill, MD  Encounter Date: 03/20/2017      PT End of Session - 03/20/17 1106    Visit Number 17   Number of Visits 17   Date for PT Re-Evaluation 03/22/17   Authorization Type 17/10   PT Start Time 1102   PT Stop Time 1144   PT Time Calculation (min) 42 min   Equipment Utilized During Treatment Gait belt   Activity Tolerance Patient tolerated treatment well;Patient limited by fatigue   Behavior During Therapy WFL for tasks assessed/performed      Past Medical History:  Diagnosis Date  . BPH (benign prostatic hyperplasia)   . Coronary artery disease   . Hypertension     Past Surgical History:  Procedure Laterality Date  . Intestinal Blockage    . SKIN CANCER EXCISION     x2  . TONSILLECTOMY      There were no vitals filed for this visit.      Subjective Assessment - 03/20/17 1106    Subjective Patient is doing fine today, no new issues. Patient has back pain 0/10.    Patient is accompained by: Family member   Pertinent History Subcortical hemorrhage of left cerebral hemisphere (01/05/2017), parkinson's, [facility] Spotted Fever, Basal cell CA, squamous cell CA, DDD cervical, OA lumbar, HTN, BPH, vertigo, CAD, peripheral neuropathy   Limitations Standing;Walking;House hold activities   Patient Stated Goals Walk in community, workout at Club, treadmill, work in yard,    Currently in Pain? No/denies   Pain Score 0-No pain   Pain Onset More than a month ago   Multiple Pain Sites No        NEUROMUSCULAR RE-EDUCATION   Airex NBOS eyes open horizontal and vertical head turns x 30 seconds;cues for posture correction  Airex cone  reaching crossing midline cues for  looking up   Toe tapping 6 inch stool without UE assist, cues for technique  Tandem gait in // bars x 4 laps , posture correction cues  Side stepping on blue  foam balance beam x 5 lengths of the parallel bars with posture correction cues  4 square fwd/bwd, side to side stepping/ diagonal stepping, cues to not step on the lines   Standing on purple foam with feet apart, feet together, feet together with eyes closed x2 minutes each, cues for technique and posture correction  Standing on purple foam tandem with RLE in front, tandem with LLE in front, tandem with eyes closed x1 minute each, cues for technique and posture correction  Standing on purple foam with feet together cone reaching side to side , cues for technique and posture correction    Patient required tactile cueing and CGA during all dynamic standing balance activities. Patient required verbal and tactile cueing to maintain correct position during sidelying clamshell exercise  cues for proper technique of exercises, slow eccentric contractions to target specific muscles and facility increased muscle building. Therapeutic rest breaks for energy conservation                          PT Education - 03/20/17 1106    Education provided Yes   Education Details safety with steps   Person(s) Educated Patient  Methods Explanation   Comprehension Verbalized understanding          PT Short Term Goals - 02/26/17 1424      PT SHORT TERM GOAL #1   Title Patient & wife demonstrate understanding of initial HEP. (Target Date: 02/22/2017)   Time 4   Period Weeks   Status Achieved     PT SHORT TERM GOAL #2   Title Cognitive Timed Up-Go <12 seconds.  (Target Date: 02/22/2017)   Baseline 12 sec   Time 4   Period Weeks   Status Achieved     PT SHORT TERM GOAL #3   Title Patient ambulates with head turns without balance loss & maintaining path.  (Target Date: 02/22/2017)   Baseline achieved   Time 4   Period  Weeks   Status Achieved     PT SHORT TERM GOAL #4   Title Berg Balance >45/56 to indicate lower fall risk.   (Target Date: 02/22/2017)   Baseline 50/56   Time 4   Status Partially Met           PT Long Term Goals - 03/05/17 1049      PT LONG TERM GOAL #1   Title Patient demonstrates with wife's cueing ongoing exercise plan. (Target Date: 03/22/2017)   Time 8   Period Weeks   Status On-going     PT LONG TERM GOAL #2   Title Berg Balance >/= 50/56 to indicate lower fall risk. (Target Date: 03/22/2017)   Baseline 50/56   Time 8   Period Weeks   Status Achieved     PT LONG TERM GOAL #3   Title Cognitive Timed Up-Go increases <25% from standard TUG to indicate lower fall risk.  (Target Date: 03/22/2017)   Baseline 12 sec   Time 8   Period Weeks   Status Achieved     PT LONG TERM GOAL #4   Title Functional Gait Assessment >20/30 indicating lower fall risk.  (Target Date: 03/22/2017)   Baseline 24   Time 8   Period Weeks   Status Achieved     PT LONG TERM GOAL #5   Title MiniBESTest >14/28 indicating lower fall risk.  (Target Date: 03/22/2017)   Baseline NT   Time 8   Period Weeks   Status On-going     Additional Long Term Goals   Additional Long Term Goals Yes     PT LONG TERM GOAL #6   Title Patient reports low back pain with standing & walking </= 3/10 (Target Date: 03/22/2017)   Baseline 2/10   Time 8   Period Weeks   Status Achieved     PT LONG TERM GOAL #7   Title Patient will improve Berg balance by 6 points indicating improved dynamic standing balance.   Baseline 50/56   Time 8   Period Weeks   Status New               Plan - 03/20/17 1107    Clinical Impression Statement Pt presents with unsteadiness on uneven surfaces and fatigues with therapeutic exercises.  Patient tolerated all interventions well this date and will benefit from continued skilled PT interventions to improve strength and balance and decrease risk of falling.Treatment session  today with focus on endurance, safety and balance training on treadmill, on compliant and uneven surface and gait with multiple head turns.  Pt tolerated well but continues to demonstrate posterior LOB and poor balance strategies and postural control on compliant surfaces;  will continue to address.   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), parkinson's, [facility] Spotted Fever, Basal cell CA, squamous cell CA, DDD cervical, OA lumbar, HTN, BPH, vertigo, CAD, peripheral neuropathy   PT Frequency 2x / week   PT Duration 8 weeks   PT Treatment/Interventions ADLs/Self Care Home Management;Gait training;Stair training;Functional mobility training;Therapeutic activities;Therapeutic exercise;Balance training;Neuromuscular re-education;Patient/family education;Ultrasound;Moist Heat;Manual techniques   PT Next Visit Plan Work on walking speed, endurance on treadmill, gait outside and on compliant surfaces, PWR exercises   Consulted and Agree with Plan of Care Patient      Patient will benefit from skilled therapeutic intervention in order to improve the following deficits and impairments:  Abnormal gait, Decreased activity tolerance, Decreased balance, Decreased mobility, Decreased strength, Pain  Visit Diagnosis: Hemianopsia  Muscle weakness (generalized)  Other lack of coordination  Unsteadiness on feet     Problem List Patient Active Problem List   Diagnosis Date Noted  . Stenosis of right carotid artery 02/01/2017  . Acute embolic stroke ([place]) 82/57/4935  . Bradycardia 11/10/2015  . Essential tremor 06/03/2015  . Dizziness 08/21/2012  . Syncope 04/10/2011  . CALF PAIN, LEFT 10/11/2010  . DYSPEPSIA 07/31/2010  . CHEST DISCOMFORT 07/31/2010  . Hyperlipidemia 05/16/2010  . HYPERTENSION, BENIGN 05/16/2010  . CAD, NATIVE VESSEL 05/16/2010   Alanson Puls, PT, DPT [place], Minette [place]land  S 03/20/2017, 11:08 AM  [place] MAIN [facility] SERVICES [address] [place], [place], [address] Phone: [phone]   Fax:  [phone]  Name: [name] [facility]. MRN: [id number] Date of Birth: 1933/03/01

## 2017-03-20 NOTE — Therapy (Signed)
[place] [facility] MAIN [facility] SERVICES [address] [place], [place], [address] Phone: [phone]   Fax:  [phone]  Speech Language Pathology Treatment  Patient Details  Name: [name]. MRN: [id number] Date of Birth: [birth year] Referring Provider: Lorretta Harp, MD  Encounter Date: 03/20/2017      End of Session - 03/20/17 1242    Visit Number 14   Number of Visits 17   Date for SLP Re-Evaluation 03/29/17   SLP Start Time 1015   SLP Stop Time  1100   SLP Time Calculation (min) 45 min   Activity Tolerance Patient tolerated treatment well      Past Medical History:  Diagnosis Date  . BPH (benign prostatic hyperplasia)   . Coronary artery disease   . Hypertension     Past Surgical History:  Procedure Laterality Date  . Intestinal Blockage    . SKIN CANCER EXCISION     x2  . TONSILLECTOMY      There were no vitals filed for this visit.      Subjective Assessment - 03/20/17 1241    Subjective "The more I need to talk, the more I can't get it done!"               ADULT SLP TREATMENT - 03/20/17 0001      General Information   Behavior/Cognition Alert;Cooperative;Pleasant mood     Treatment Provided   Treatment provided Cognitive-Linquistic     Pain Assessment   Pain Assessment No/denies pain     Cognitive-Linquistic Treatment   Treatment focused on Aphasia   Skilled Treatment SENTENCE STRUCTURE: Patient completed a sentence structure exercise (fill in the blank exercise) given min SLP cues to consider the information content of the total sentence and grammatical structure.  Patient demonstrates spelling errors, including omitting morphological markers and letter/cognate substitutions.     Assessment / Recommendations / Plan   Plan Continue with current plan of care     Progression Toward Goals   Progression toward goals Progressing toward goals          SLP Education - 03/20/17 1241    Education provided Yes   Education Details Be patient- give yourself time to consider information content and grammar   Person(s) Educated Patient   Methods Explanation   Comprehension Verbalized understanding          SLP Short Term Goals - 02/14/17 1435      SLP SHORT TERM GOAL #1   Title pt will name average 10 items in concrete category in 60 seconds, over two sessions   Time 2   Period Weeks   Status On-going     SLP SHORT TERM GOAL #2   Title pt will demo compensations for expressive language in functional 7 minute mod complex conversation    Time 2   Period Weeks   Status On-going     SLP SHORT TERM GOAL #3   Title pt will undergo assessment of reading in first 3 therapy sessions   Status Achieved     SLP SHORT TERM GOAL #4   Title pt will read sentences of 10-12 words and demo comprehension at 90% success   Time 2   Period Weeks   Status On-going          SLP Long Term Goals - 03/07/17 1451      SLP LONG TERM GOAL #1   Title pt will use compensations for functional 10 minute mod-max complex  conversation   Time 6   Period Weeks   Status Partially Met     SLP LONG TERM GOAL #2   Title  pt will demo [facility] tasks requiring memory compensations (meds, appointments, etc)   Time 6   Period Weeks   Status Achieved     SLP LONG TERM GOAL #3   Title pt will demo functional reading comprehension in 3-4 sentence paragraphs   Time 6   Period Weeks   Status Achieved     SLP LONG TERM GOAL #4   Title Patient will generate grammatical and meaningful sentences to complete moderate cognitive-linguistic task with 80% accruacy.   Time 6   Period Weeks   Status New     SLP LONG TERM GOAL #5   Title Patient will complete high level word finding activities with 80% accuracy.   Time 6   Period Weeks   Status New          Plan - 03/20/17 1243    Clinical Impression Statement The patient was able to demonstrate good word retrieval, given min SLP cues, to complete  a fill-in-the-blank task that required consideration of total meaning of sentence for information content and grammatical structure. Patient demonstrates mild spelling errors, including omitting morphological markers and letter/cognate substitutions.   Speech Therapy Frequency 2x / week   Duration Other (comment)   Treatment/Interventions Language facilitation;Internal/external aids;SLP instruction and feedback;Multimodal communcation approach;Functional tasks;Cognitive reorganization;Oral motor exercises;Cueing hierarchy;Patient/family education;Compensatory techniques   Potential to Achieve Goals Good   Potential Considerations Ability to learn/carryover information;Cooperation/participation level;Previous level of function;Severity of impairments;Family/community support   SLP Home Exercise Plan scrambled sentences   Consulted and Agree with Plan of Care Patient      Patient will benefit from skilled therapeutic intervention in order to improve the following deficits and impairments:   Aphasia    Problem List Patient Active Problem List   Diagnosis Date Noted  . Stenosis of right carotid artery 02/01/2017  . Acute embolic stroke ([place]) 29/24/4628  . Bradycardia 11/10/2015  . Essential tremor 06/03/2015  . Dizziness 08/21/2012  . Syncope 04/10/2011  . CALF PAIN, LEFT 10/11/2010  . DYSPEPSIA 07/31/2010  . CHEST DISCOMFORT 07/31/2010  . Hyperlipidemia 05/16/2010  . HYPERTENSION, BENIGN 05/16/2010  . CAD, NATIVE VESSEL 05/16/2010   Leroy Sea, MS/CCC- SLP  Lou Miner 03/20/2017, 12:44 PM  [place] MAIN [facility] SERVICES [address] [place], [place], [address] Phone: [phone]   Fax:  [phone]   Name: Jamin Panther [facility]. MRN: [id number] Date of Birth: [birth year]

## 2017-03-25 ENCOUNTER — Ambulatory Visit: Payer: Medicare Other | Admitting: Physical Therapy

## 2017-03-25 ENCOUNTER — Encounter: Payer: Medicare Other | Admitting: Occupational Therapy

## 2017-03-25 ENCOUNTER — Ambulatory Visit: Payer: Medicare Other | Admitting: Occupational Therapy

## 2017-03-25 ENCOUNTER — Encounter: Payer: Self-pay | Admitting: Physical Therapy

## 2017-03-25 ENCOUNTER — Ambulatory Visit: Payer: Medicare Other | Admitting: Speech Pathology

## 2017-03-25 ENCOUNTER — Encounter: Payer: Self-pay | Admitting: Speech Pathology

## 2017-03-25 DIAGNOSIS — H5347 Heteronymous bilateral field defects: Secondary | ICD-10-CM | POA: Diagnosis not present

## 2017-03-25 DIAGNOSIS — R4701 Aphasia: Secondary | ICD-10-CM

## 2017-03-25 DIAGNOSIS — R2681 Unsteadiness on feet: Secondary | ICD-10-CM

## 2017-03-25 DIAGNOSIS — R278 Other lack of coordination: Secondary | ICD-10-CM

## 2017-03-25 DIAGNOSIS — M6281 Muscle weakness (generalized): Secondary | ICD-10-CM

## 2017-03-25 NOTE — Therapy (Signed)
[place] MAIN [facility] SERVICES [address] [place], [place], [address] Phone: [phone]   Fax:  [phone]  Occupational Therapy Treatment  Patient Details  Name: [name]. MRN: [id number] Date of Birth: November 20, 1933 Referring Provider: Dr. Rea College  Encounter Date: 03/25/2017      OT End of Session - 03/25/17 0923    Visit Number 14   Number of Visits 32   Date for OT Re-Evaluation 05/14/17   Authorization Type medicare G-code 4   OT Start Time 0915   OT Stop Time 1000   OT Time Calculation (min) 45 min   Activity Tolerance Patient tolerated treatment well   Behavior During Therapy [facility] for tasks assessed/performed      Past Medical History:  Diagnosis Date  . BPH (benign prostatic hyperplasia)   . Coronary artery disease   . Hypertension     Past Surgical History:  Procedure Laterality Date  . Intestinal Blockage    . SKIN CANCER EXCISION     x2  . TONSILLECTOMY      There were no vitals filed for this visit.      Subjective Assessment - 03/25/17 0921    Subjective  Pt. reports his wife has had the flu since Thursday.   Patient is accompained by: Family member   Pertinent History see epic.  L CVA, R hemiopsia, Parkinson's, essential tremor   Patient Stated Goals I want to get well and be perfect again.  I hate these questions (pt unable to give specific answer)   Currently in Pain? No/denies                      OT Treatments/Exercises (OP) - 03/25/17 2010      Visual/Perceptual Exercises   Other Exercises Pt. worked on scanning and visual search strategies in near space in preparation for tabletop tasks in preparation for reading, and writing. Pt. worked on copying telephone numbers, and word searches with cues to use a horizontal rectilinear left to right visual search strategy. Pt. worked on visual saccades from left to right at 6", shoulder width, and 22" apart with multiple  omissions on the right, and in the center. Pt. requires increased time to complete.                OT Education - 03/25/17 [phone]    Education provided Yes   Education Details Visual compensatory strategies   Person(s) Educated Patient   Methods Explanation   Comprehension Verbalized understanding          OT Short Term Goals - 02/14/17 1457      OT SHORT TERM GOAL #1   Title Pt and wife will be mod I with HEP - 02/21/2017   Time 4   Period Weeks   Status On-going  needs reinforcement of coordination HEP     OT SHORT TERM GOAL #2   Title Pt will be mod I with shower transfers   Status Achieved  met per pt report     OT SHORT TERM GOAL #3   Title Pt will be mod I with bathing   Status Achieved     OT SHORT TERM GOAL #4   Title Pt will complete table top scanning activity (not lanaguage or letter based) with 75% accuracy   Status Achieved  met for copying small peg design with min v.c.     OT SHORT TERM GOAL #5   Title Pt  will attend to familiar task in busy environment with no more than min vc's.     Status Achieved           OT Long Term Goals - 03/19/17 1114      OT LONG TERM GOAL #1   Title Pt and wife will be mod I with ugraded HEP prn - 03/21/2017   Time 8   Period Weeks   Status On-going     OT LONG TERM GOAL #2   Title Pt will demonstrate improved coordinaton by decreasing time on 9 hole peg test by 4 seconds (baseline=34.15)   Time 8   Period Weeks   Status Achieved     OT LONG TERM GOAL #3   Title Pt will be able to print/write name legibly AE prn- (revised goal) Pt will demonstrate ability to write a 4 sentence paragraph with 100% legibility, and no significant decr. in letter size using AE PRN.   Time 8   Period Weeks   Status Achieved     OT LONG TERM GOAL #4   Title Pt will demonstate ability to maintain balance on outdoor uneven surfaces in preparation for returning to simple yard work.    Time 8   Period Weeks   Status Deferred      OT LONG TERM GOAL #5   Title Pt will require no more than 2 vc's to recall information from therapy session   Time 8   Period Weeks   Status On-going     OT LONG TERM GOAL #6   Title Pt will complete table top scanning activity (not language based) with 90% accuracy   Time 8   Period Weeks   Status On-going     OT LONG TERM GOAL #7   Title Pt will complete environmental scanning task with at least 75% accuracy.  Revised to 90% accuracy 02/14/17   Time 8   Period Weeks   Status On-going     OT LONG TERM GOAL #8   Title Pt will perform simple divided attention with environmental scanning for improved safety for ambulation/in prep for driving with at least 75% accuracy.   Baseline Pt. will demonstrate visual compensatory strategies 100% of the time during ADLs, and IADLs.    Time 8   Period Weeks   Status New               Plan - 03/25/17 0929    Clinical Impression Statement Pt. reports his wife has been sick with the flu, and reports he has been tending to her all weekend. Pt. continues to present with visual impairments. Pt. continues to work on improving visual scanning, and visual search patterns for near space tabletop activities, and for extrapersonal space within his environament during ADLs, and IADLs.     Rehab Potential Good   Clinical Impairments Affecting Rehab Potential decr awareness/   OT Frequency 2x / week   OT Treatment/Interventions Self-care/ADL training;Electrical Stimulation;Therapeutic exercise;Neuromuscular education;DME and/or AE instruction;Energy conservation;Therapist, nutritional;Therapeutic activities;Cognitive remediation/compensation;Visual/perceptual remediation/compensation;Patient/family education;Balance training   Consulted and Agree with Plan of Care Patient      Patient will benefit from skilled therapeutic intervention in order to improve the following deficits and impairments:  Decreased balance, Decreased cognition,  Decreased coordination, Decreased safety awareness, Decreased mobility, Decreased knowledge of use of DME, Impaired UE functional use, Impaired vision/preception  Visit Diagnosis: Hemianopsia    Problem List Patient Active Problem List   Diagnosis Date Noted  . Stenosis of  right carotid artery 02/01/2017  . Acute embolic stroke ([place]) 98/92/1194  . Bradycardia 11/10/2015  . Essential tremor 06/03/2015  . Dizziness 08/21/2012  . Syncope 04/10/2011  . CALF PAIN, LEFT 10/11/2010  . DYSPEPSIA 07/31/2010  . CHEST DISCOMFORT 07/31/2010  . Hyperlipidemia 05/16/2010  . HYPERTENSION, BENIGN 05/16/2010  . CAD, NATIVE VESSEL 05/16/2010    Harrel Carina, MS, OTR/L 03/25/2017, 10:01 AM  [place] MAIN [facility] SERVICES [address] [place], [place], [address] Phone: [phone]   Fax:  [phone]  Name: Calvin Barfield [facility]. MRN: [id number] Date of Birth: [birth year]

## 2017-03-25 NOTE — Therapy (Signed)
[place] MAIN [facility] SERVICES [address] [place], [place], [address] Phone: [phone]   Fax:  [phone]  Speech Language Pathology Treatment  Patient Details  Name: [name]. MRN: [id number] Date of Birth: 04-09-1933 Referring Provider: Launa Grill, MD  Encounter Date: 03/25/2017      End of Session - 03/25/17 1235    Visit Number 15   Number of Visits 17   Date for SLP Re-Evaluation 03/29/17   SLP Start Time 32   SLP Stop Time  1055   SLP Time Calculation (min) 50 min   Activity Tolerance Patient tolerated treatment well      Past Medical History:  Diagnosis Date  . BPH (benign prostatic hyperplasia)   . Coronary artery disease   . Hypertension     Past Surgical History:  Procedure Laterality Date  . Intestinal Blockage    . SKIN CANCER EXCISION     x2  . TONSILLECTOMY      There were no vitals filed for this visit.      Subjective Assessment - 03/25/17 1235    Subjective "The more I need to talk, the more I can't get it done!"   Currently in Pain? No/denies               ADULT SLP TREATMENT - 03/25/17 0001      General Information   Behavior/Cognition Alert;Cooperative;Pleasant mood     Treatment Provided   Treatment provided Cognitive-Linquistic     Pain Assessment   Pain Assessment No/denies pain     Cognitive-Linquistic Treatment   Treatment focused on Aphasia   Skilled Treatment SENTENCE STRUCTURE: Patient completed a sentence structure exercise (fill in the blank exercise) given min SLP cues to consider the information content of the total sentence and grammatical structure.  Write sentences given 3 unrelated words with 80% accuracy, given min SLP cues for  mental flexibility.  Patient demonstrates spelling errors, including omitting morphological markers and letter/cognate substitutions.       Assessment / Recommendations / Plan   Plan Continue with current plan of care      Progression Toward Goals   Progression toward goals Progressing toward goals          SLP Education - 03/25/17 1235    Education provided Yes   Education Details Word finding strategies, mental flexibility   Person(s) Educated Patient   Methods Explanation   Comprehension Verbalized understanding          SLP Short Term Goals - 02/14/17 1435      SLP SHORT TERM GOAL #1   Title pt will name average 10 items in concrete category in 60 seconds, over two sessions   Time 2   Period Weeks   Status On-going     SLP SHORT TERM GOAL #2   Title pt will demo compensations for expressive language in functional 7 minute mod complex conversation    Time 2   Period Weeks   Status On-going     SLP SHORT TERM GOAL #3   Title pt will undergo assessment of reading in first 3 therapy sessions   Status Achieved     SLP SHORT TERM GOAL #4   Title pt will read sentences of 10-12 words and demo comprehension at 90% success   Time 2   Period Weeks   Status On-going          SLP Long Term Goals - 03/07/17 1451  SLP LONG TERM GOAL #1   Title pt will use compensations for functional 10 minute mod-max complex conversation   Time 6   Period Weeks   Status Partially Met     SLP LONG TERM GOAL #2   Title  pt will demo [facility] tasks requiring memory compensations (meds, appointments, etc)   Time 6   Period Weeks   Status Achieved     SLP LONG TERM GOAL #3   Title pt will demo functional reading comprehension in 3-4 sentence paragraphs   Time 6   Period Weeks   Status Achieved     SLP LONG TERM GOAL #4   Title Patient will generate grammatical and meaningful sentences to complete moderate cognitive-linguistic task with 80% accruacy.   Time 6   Period Weeks   Status New     SLP LONG TERM GOAL #5   Title Patient will complete high level word finding activities with 80% accuracy.   Time 6   Period Weeks   Status New          Plan - 03/25/17 1236    Clinical  Impression Statement The patient was able to demonstrate good word retrieval, given min SLP cues, to complete a fill-in-the-blank task that required consideration of total meaning of sentence for information content and grammatical structure. Patient demonstrates mild spelling errors, including omitting morphological markers and letter/cognate substitutions.   Speech Therapy Frequency 2x / week   Duration Other (comment)   Treatment/Interventions Language facilitation;Internal/external aids;SLP instruction and feedback;Multimodal communcation approach;Functional tasks;Cognitive reorganization;Oral motor exercises;Cueing hierarchy;Patient/family education;Compensatory techniques   Potential to Achieve Goals Good   Potential Considerations Ability to learn/carryover information;Cooperation/participation level;Previous level of function;Severity of impairments;Family/community support   SLP Home Exercise Plan scrambled sentences   Consulted and Agree with Plan of Care Patient      Patient will benefit from skilled therapeutic intervention in order to improve the following deficits and impairments:   Aphasia    Problem List Patient Active Problem List   Diagnosis Date Noted  . Stenosis of right carotid artery 02/01/2017  . Acute embolic stroke ([place]) 23/36/1224  . Bradycardia 11/10/2015  . Essential tremor 06/03/2015  . Dizziness 08/21/2012  . Syncope 04/10/2011  . CALF PAIN, LEFT 10/11/2010  . DYSPEPSIA 07/31/2010  . CHEST DISCOMFORT 07/31/2010  . Hyperlipidemia 05/16/2010  . HYPERTENSION, BENIGN 05/16/2010  . CAD, NATIVE VESSEL 05/16/2010    Leroy Sea, MS/CCC- SLP  Lou Miner 03/25/2017, 12:36 PM  [place] MAIN [facility] SERVICES [address] [place], [place], [address] Phone: [phone]   Fax:  [phone]   Name: Camren Lipsett [facility]. MRN: [id number] Date of Birth: January 14, 1933

## 2017-03-25 NOTE — Patient Instructions (Addendum)
  OT TREATMENT     Neuro muscular re-education:  Therapeutic Exercise:  Selfcare:  Pt. worked on scanning and visual search strategies in near space in preparation for tabletop tasks in preparation for reading, and writing. Pt. worked on copying telephone numbers, and word searches with cues to use a horizontal rectilinear left to right visual search strategy. Pt. worked on visual saccades from left to right at 6", shoulder width, and 22" apart with multiple omissions on the right, and in the center. Pt. requires increased time to complete.   Manualrapy:

## 2017-03-25 NOTE — Therapy (Addendum)
[place] MAIN [facility] SERVICES [address] [place], [place], [address] Phone: [phone]   Fax:  [phone]  Physical Therapy Treatment  Patient Details  Name: [name]. MRN: [id number] Date of Birth: [birth year] Referring Provider: Launa Grill, MD  Encounter Date: 03/25/2017      PT End of Session - 03/25/17 1106    Visit Number 18   Number of Visits 33   Date for PT Re-Evaluation 05/20/17   Authorization Type 18/10   PT Start Time 1100   PT Stop Time 1138   PT Time Calculation (min) 38 min   Equipment Utilized During Treatment Gait belt   Activity Tolerance Patient tolerated treatment well;Patient limited by fatigue   Behavior During Therapy WFL for tasks assessed/performed      Past Medical History:  Diagnosis Date  . BPH (benign prostatic hyperplasia)   . Coronary artery disease   . Hypertension     Past Surgical History:  Procedure Laterality Date  . Intestinal Blockage    . SKIN CANCER EXCISION     x2  . TONSILLECTOMY      There were no vitals filed for this visit.      Subjective Assessment - 03/25/17 1104    Subjective Patient reports 7/10 back pain today when he is moving around and sitting it is a 0/10.   Currently in Pain? Yes   Pain Score 7    Pain Location Back   Pain Orientation Lower   Pain Descriptors / Indicators Aching   Pain Type Chronic pain   Pain Onset More than a month ago   Pain Frequency Intermittent   Aggravating Factors  walking   Pain Relieving Factors sitting   Effect of Pain on Daily Activities none   Multiple Pain Sites No     Treatment: Eccentric step downs x 10 BLE; cues to not put too much weight bearing on UE's Squats x 10 with 5 sec hold, cues to maintain erect position Heel raises x 10 x 2 ; cues to not rock forward Side stepping on blue balance beam, tandem stepping on blue balance beam x 5 repetitions Resisted side-steeping RTB 4 lengths x 2;; cues to  not rotate trunk towards direction of mobility Standing mini squats 2 x 10 with RTB around knees to encourage abduction; cues to maintain correct erect postur Step-ups to 6" step x 10 bilateral; cues for getting entire foot on step Side stepping over bolster left and right and fwd/ bwd x 10 x 2 Quantum leg press 120 # x 20 x 3; cues for not snapping LE in extension and performing slowly Matrix LE extension / abd with 7.5 lbs and cues to not rock fwd and maintain erect posture Resisted walking fwd, retro, bil side stepping with 17.5# x 3 laps each; min A throughout due to pt being unsteady and losing balance multiple times throughout each direction; increased time to complete task Patient needs cues for head position and posture and correction of exercise technique. Patient needs CGA for all balance exercises.                          PT Education - 03/25/17 1105    Education provided Yes   Education Details safety with steps   Person(s) Educated Patient   Methods Explanation   Comprehension Verbalized understanding          PT Short Term Goals - 02/26/17 1424  PT SHORT TERM GOAL #1   Title Patient & wife demonstrate understanding of initial HEP. (Target Date: 02/22/2017)   Time 4   Period Weeks   Status Achieved     PT SHORT TERM GOAL #2   Title Cognitive Timed Up-Go <12 seconds.  (Target Date: 02/22/2017)   Baseline 12 sec   Time 4   Period Weeks   Status Achieved     PT SHORT TERM GOAL #3   Title Patient ambulates with head turns without balance loss & maintaining path.  (Target Date: 02/22/2017)   Baseline achieved   Time 4   Period Weeks   Status Achieved     PT SHORT TERM GOAL #4   Title Berg Balance >45/56 to indicate lower fall risk.   (Target Date: 02/22/2017)   Baseline 50/56   Time 4   Status Partially Met           PT Long Term Goals - 03/26/17 1422      PT LONG TERM GOAL #1   Title Patient demonstrates with wife's cueing  ongoing exercise plan. (Target Date: 03/22/2017)   Time 8   Period Weeks   Status Achieved     PT LONG TERM GOAL #2   Title Berg Balance >/= 50/56 to indicate lower fall risk. (Target Date: 03/22/2017)   Baseline 50/56   Time 8   Period Weeks   Status Achieved     PT LONG TERM GOAL #3   Title Cognitive Timed Up-Go increases <25% from standard TUG to indicate lower fall risk.  (Target Date: 03/22/2017)   Baseline 12 sec   Time 8   Period Weeks   Status Achieved     PT LONG TERM GOAL #4   Title Functional Gait Assessment >20/30 indicating lower fall risk.  (Target Date: 03/22/2017)   Baseline 24   Time 8   Period Weeks   Status Achieved     PT LONG TERM GOAL #5   Title MiniBESTest >14/28 indicating lower fall risk.  (Target Date: 03/22/2017)   Baseline NT   Time 8   Period Weeks   Status On-going     PT LONG TERM GOAL #6   Title Patient reports low back pain with standing & walking </= 3/10 (Target Date: 03/22/2017)   Baseline 7/10   Time 8   Period Weeks   Status Achieved     PT LONG TERM GOAL #7   Title Patient will improve Berg balance by 6 points indicating improved dynamic standing balance.   Baseline 52/56   Time 8   Period Weeks   Status Partially Met               Plan - 03/25/17 1107    Clinical Impression Statement Patient instructed in advanced strengthening and balance exercise. Patient instructed in advanced LE strengthening and SLS balance exercise. Dynamic standing balance is good , he has difficulty with single leg standing and mobiity wiht head turns.  Patient required min VCS to improve weight shift and to increase terminal knee extension for better stance control. Patient requires min Vcs for correct exercise technique including to improve trunk control with standing exercise. Patient demonstrates better quad control with SLS tasks with rail assist. Patient would benefit from additional skilled PT intervention to improve balance/gait safety and  reduce fall risk.   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), parkinson's, [place] Spotted Fever, Basal  cell CA, squamous cell CA, DDD cervical, OA lumbar, HTN, BPH, vertigo, CAD, peripheral neuropathy   PT Frequency 2x / week   PT Duration 8 weeks   PT Treatment/Interventions ADLs/Self Care Home Management;Gait training;Stair training;Functional mobility training;Therapeutic activities;Therapeutic exercise;Balance training;Neuromuscular re-education;Patient/family education;Ultrasound;Moist Heat;Manual techniques   PT Next Visit Plan Work on walking speed, endurance on treadmill, gait outside and on compliant surfaces, PWR exercises   Consulted and Agree with Plan of Care Patient      Patient will benefit from skilled therapeutic intervention in order to improve the following deficits and impairments:  Abnormal gait, Decreased activity tolerance, Decreased balance, Decreased mobility, Decreased strength, Pain  Visit Diagnosis: Hemianopsia - Plan: PT plan of care cert/re-cert  Muscle weakness (generalized) - Plan: PT plan of care cert/re-cert  Other lack of coordination - Plan: PT plan of care cert/re-cert  Unsteadiness on feet - Plan: PT plan of care cert/re-cert     Problem List Patient Active Problem List   Diagnosis Date Noted  . Stenosis of right carotid artery 02/01/2017  . Acute embolic stroke ([place]) 09/29/9021  . Bradycardia 11/10/2015  . Essential tremor 06/03/2015  . Dizziness 08/21/2012  . Syncope 04/10/2011  . CALF PAIN, LEFT 10/11/2010  . DYSPEPSIA 07/31/2010  . CHEST DISCOMFORT 07/31/2010  . Hyperlipidemia 05/16/2010  . HYPERTENSION, BENIGN 05/16/2010  . CAD, NATIVE VESSEL 05/16/2010   Alanson Puls, PT, DPT [place], [place] S 03/26/2017, 2:28 PM  [place] MAIN [facility] SERVICES [address] [place], [place],  [address] Phone: [phone]   Fax:  [phone]  Name: Marquell Saenz [facility]. MRN: [id number] Date of Birth: [birth year]

## 2017-03-26 NOTE — Addendum Note (Signed)
Addended by: Ezekiel Ina on: 03/26/2017 02:29 PM   Modules accepted: Orders

## 2017-03-27 ENCOUNTER — Encounter: Payer: Self-pay | Admitting: Physical Therapy

## 2017-03-27 ENCOUNTER — Telehealth: Payer: Self-pay | Admitting: Cardiovascular Disease

## 2017-03-27 ENCOUNTER — Ambulatory Visit: Payer: Medicare Other | Admitting: Occupational Therapy

## 2017-03-27 ENCOUNTER — Ambulatory Visit: Payer: Medicare Other | Admitting: Physical Therapy

## 2017-03-27 ENCOUNTER — Encounter: Payer: Self-pay | Admitting: Speech Pathology

## 2017-03-27 ENCOUNTER — Encounter: Payer: Medicare Other | Admitting: Occupational Therapy

## 2017-03-27 ENCOUNTER — Ambulatory Visit: Payer: Medicare Other | Admitting: Speech Pathology

## 2017-03-27 DIAGNOSIS — I4940 Unspecified premature depolarization: Secondary | ICD-10-CM

## 2017-03-27 DIAGNOSIS — R4701 Aphasia: Secondary | ICD-10-CM

## 2017-03-27 DIAGNOSIS — R278 Other lack of coordination: Secondary | ICD-10-CM

## 2017-03-27 DIAGNOSIS — M6281 Muscle weakness (generalized): Secondary | ICD-10-CM

## 2017-03-27 DIAGNOSIS — H5347 Heteronymous bilateral field defects: Secondary | ICD-10-CM

## 2017-03-27 DIAGNOSIS — R2681 Unsteadiness on feet: Secondary | ICD-10-CM

## 2017-03-27 NOTE — Therapy (Signed)
[place] MAIN [facility] SERVICES [address] [place], [place], [address] Phone: [phone]   Fax:  [phone]  Occupational Therapy Treatment  Patient Details  Name: [name]. MRN: [id number] Date of Birth: 08-07-1933 Referring Provider: Dr. Rea College  Encounter Date: 03/27/2017      OT End of Session - 03/27/17 0931    Visit Number 15   Number of Visits 32   Date for OT Re-Evaluation 05/14/17   Authorization Type medicare G-code 3   OT Start Time 0915   OT Stop Time 1000   OT Time Calculation (min) 45 min   Activity Tolerance Patient tolerated treatment well   Behavior During Therapy [facility] for tasks assessed/performed      Past Medical History:  Diagnosis Date  . BPH (benign prostatic hyperplasia)   . Coronary artery disease   . Hypertension     Past Surgical History:  Procedure Laterality Date  . Intestinal Blockage    . SKIN CANCER EXCISION     x2  . TONSILLECTOMY      There were no vitals filed for this visit.      Subjective Assessment - 03/27/17 0929    Subjective  Pt. reports his wife is still sick.   Patient is accompained by: Family member   Patient Stated Goals I want to get well and be perfect again.  I hate these questions (pt unable to give specific answer)   Currently in Pain? No/denies                      OT Treatments/Exercises (OP) - 03/27/17 7322      Visual/Perceptual Exercises   Other Exercises Pt. worked on a scan course. Pt. was able to identify all items accurately, however had to slow speed down to identify the items. Pt. required verbal cues for obstacles (chairs). Pt. worked on visual scanning for tabletop tasks in near space. Pt. Was able to accurately identify, and cross out 100% of the double numbers. Pt. worked on [employer] with increasing complexity to challenge the right. Pt. worked on visual scanning tasks to iprepare for reading, writing, and ADL/IADL  tasks..                OT Education - 03/27/17 0930    Education provided Yes   Education Details visual scanning,   Person(s) Educated Patient   Methods Explanation   Comprehension Verbalized understanding          OT Short Term Goals - 02/14/17 1457      OT SHORT TERM GOAL #1   Title Pt and wife will be mod I with HEP - 02/21/2017   Time 4   Period Weeks   Status On-going  needs reinforcement of coordination HEP     OT SHORT TERM GOAL #2   Title Pt will be mod I with shower transfers   Status Achieved  met per pt report     OT SHORT TERM GOAL #3   Title Pt will be mod I with bathing   Status Achieved     OT SHORT TERM GOAL #4   Title Pt will complete table top scanning activity (not lanaguage or letter based) with 75% accuracy   Status Achieved  met for copying small peg design with min v.c.     OT SHORT TERM GOAL #5   Title Pt will attend to familiar task in busy environment with no more than  min vc's.     Status Achieved           OT Long Term Goals - 03/19/17 1114      OT LONG TERM GOAL #1   Title Pt and wife will be mod I with ugraded HEP prn - 03/21/2017   Time 8   Period Weeks   Status On-going     OT LONG TERM GOAL #2   Title Pt will demonstrate improved coordinaton by decreasing time on 9 hole peg test by 4 seconds (baseline=34.15)   Time 8   Period Weeks   Status Achieved     OT LONG TERM GOAL #3   Title Pt will be able to print/write name legibly AE prn- (revised goal) Pt will demonstrate ability to write a 4 sentence paragraph with 100% legibility, and no significant decr. in letter size using AE PRN.   Time 8   Period Weeks   Status Achieved     OT LONG TERM GOAL #4   Title Pt will demonstate ability to maintain balance on outdoor uneven surfaces in preparation for returning to simple yard work.    Time 8   Period Weeks   Status Deferred     OT LONG TERM GOAL #5   Title Pt will require no more than 2 vc's to recall  information from therapy session   Time 8   Period Weeks   Status On-going     OT LONG TERM GOAL #6   Title Pt will complete table top scanning activity (not language based) with 90% accuracy   Time 8   Period Weeks   Status On-going     OT LONG TERM GOAL #7   Title Pt will complete environmental scanning task with at least 75% accuracy.  Revised to 90% accuracy 02/14/17   Time 8   Period Weeks   Status On-going     OT LONG TERM GOAL #8   Title Pt will perform simple divided attention with environmental scanning for improved safety for ambulation/in prep for driving with at least 75% accuracy.   Baseline Pt. will demonstrate visual compensatory strategies 100% of the time during ADLs, and IADLs.    Time 8   Period Weeks   Status New               Plan - 03/27/17 0932    Clinical Impression Statement Pt. reports his wife is still sick. Pt. was able to identify all items on the scancourse, however required cues multiple times for obstacles (chairs) on the floor. Pt. slowed down to itdentify the items multiple times. Pt. conitnues to work on improving visual scanning, and visual compensatory strategies during ADLs, and IADLs.   Rehab Potential Good   Clinical Impairments Affecting Rehab Potential decr awareness/   OT Frequency 2x / week   OT Duration 8 weeks   OT Treatment/Interventions Self-care/ADL training;Electrical Stimulation;Therapeutic exercise;Neuromuscular education;DME and/or AE instruction;Energy conservation;Therapist, nutritional;Therapeutic activities;Cognitive remediation/compensation;Visual/perceptual remediation/compensation;Patient/family education;Balance training   Consulted and Agree with Plan of Care Patient      Patient will benefit from skilled therapeutic intervention in order to improve the following deficits and impairments:  Decreased balance, Decreased cognition, Decreased coordination, Decreased safety awareness, Decreased mobility,  Decreased knowledge of use of DME, Impaired UE functional use, Impaired vision/preception  Visit Diagnosis: Hemianopsia    Problem List Patient Active Problem List   Diagnosis Date Noted  . Stenosis of right carotid artery 02/01/2017  . Acute embolic stroke ([place])  02/01/2017  . Bradycardia 11/10/2015  . Essential tremor 06/03/2015  . Dizziness 08/21/2012  . Syncope 04/10/2011  . CALF PAIN, LEFT 10/11/2010  . DYSPEPSIA 07/31/2010  . CHEST DISCOMFORT 07/31/2010  . Hyperlipidemia 05/16/2010  . HYPERTENSION, BENIGN 05/16/2010  . CAD, NATIVE VESSEL 05/16/2010    Harrel Carina, MS, OTR/L 03/27/2017, 9:55 AM  [place] MAIN [facility] SERVICES [address] [place], [place], [address] Phone: [phone]   Fax:  [phone]  Name: Reshad Saab [facility]. MRN: [id number] Date of Birth: 31-Jan-1933

## 2017-03-27 NOTE — Patient Instructions (Signed)
OT TREATMENT     Neuro muscular re-education:  Therapeutic Exercise:  Selfcare:  Pt. worked on a scan course. Pt. was able to identify all items accurately, however had to slow speed down to identify the items. Pt. required verbal cues for obstacles (chairs). Pt. worked on visual scanning for tabletop tasks in near space. Pt. Was able to accurately identify, and cross out 100% of the double numbers. Pt. worked on [employer] with increasing complexity to challenge the right.  Manual Therapy:

## 2017-03-27 NOTE — Telephone Encounter (Signed)
Patients wife sent email under her mychart as follows;  Dr Mariah Milling, Cyndie Mull, my husband saw Dr Jay Schlichter in  Neuroscience at [facility] today. She put a note on his my chart for you to review  Concerning the 30 day implanted heart monitor she recommended he wear.. What  are your thoughts on this? I got the feeling from her & She indicated the doctors there thought the blood clot Might have come from the heart that caused the bleed & it should be checked out.   What did the two week heart monitor show?  If you will get back with [place] on this. You can call me  [phone].  Thank you, [name]   Replied to her message letting her know that we will call her with monitor results once Dr. Mariah Milling reviews and that I would make him aware of note in chart for him to review.

## 2017-03-27 NOTE — Therapy (Signed)
[place] MAIN [facility] SERVICES [address] [place], [place], [address] Phone: [phone]   Fax:  [phone]  Speech Language Pathology Treatment/Re-Certification  Patient Details  Name: [name]. MRN: [id number] Date of Birth: 03/27/1933 Referring Provider: Launa Grill, MD  Encounter Date: 03/27/2017      End of Session - 03/27/17 1250    Visit Number 16   Number of Visits 33   Date for SLP Re-Evaluation 05/27/17   SLP Start Time 1000   SLP Stop Time  1056   SLP Time Calculation (min) 56 min   Activity Tolerance Patient tolerated treatment well      Past Medical History:  Diagnosis Date  . BPH (benign prostatic hyperplasia)   . Coronary artery disease   . Hypertension     Past Surgical History:  Procedure Laterality Date  . Intestinal Blockage    . SKIN CANCER EXCISION     x2  . TONSILLECTOMY      There were no vitals filed for this visit.      Subjective Assessment - 03/27/17 1249    Subjective "The more I need to talk, the more I can't get it done!"   Currently in Pain? No/denies               ADULT SLP TREATMENT - 03/27/17 0001      General Information   Behavior/Cognition Alert;Cooperative;Pleasant mood     Treatment Provided   Treatment provided Cognitive-Linquistic     Pain Assessment   Pain Assessment No/denies pain     Cognitive-Linquistic Treatment   Treatment focused on Aphasia   Skilled Treatment SENTENCE STRUCTURE: Write sentence to complete moderately complex cognitive-linguistic task with 70% accuracy.  Patient demonstrates language errors including improper word usage, reduced flexibility for multiple meanings, and vague language.  Patient demonstrates spelling errors, including omitting morphological markers and letter/cognate substitutions.  WORD FINDING: Complete word finding tasks (analogies, word deduction) with 80% accuracy.     Assessment / Recommendations / Plan    Plan Continue with current plan of care          SLP Education - 03/27/17 1249    Education provided Yes   Education Details Word finding strategies, clarifying vague language   Person(s) Educated Patient   Methods Explanation   Comprehension Verbalized understanding          SLP Short Term Goals - 02/14/17 1435      SLP SHORT TERM GOAL #1   Title pt will name average 10 items in concrete category in 60 seconds, over two sessions   Time 2   Period Weeks   Status On-going     SLP SHORT TERM GOAL #2   Title pt will demo compensations for expressive language in functional 7 minute mod complex conversation    Time 2   Period Weeks   Status On-going     SLP SHORT TERM GOAL #3   Title pt will undergo assessment of reading in first 3 therapy sessions   Status Achieved     SLP SHORT TERM GOAL #4   Title pt will read sentences of 10-12 words and demo comprehension at 90% success   Time 2   Period Weeks   Status On-going          SLP Long Term Goals - 03/27/17 [place] #1   Title pt will use compensations for functional 10 minute mod-max  complex conversation   Time 6   Period Weeks   Status Revised     SLP LONG TERM GOAL #2   Title  pt will demo [facility] tasks requiring memory compensations (meds, appointments, etc)   Time 6   Period Weeks   Status Achieved     SLP LONG TERM GOAL #3   Title pt will demo functional reading comprehension in 3-4 sentence paragraphs   Time 6   Period Weeks   Status Achieved     SLP LONG TERM GOAL #4   Title Patient will generate grammatical and meaningful sentences to complete moderate cognitive-linguistic task with 80% accruacy.   Time 8   Period Weeks   Status Partially Met     SLP LONG TERM GOAL #5   Title Patient will complete high level word finding activities with 80% accuracy.   Time 8   Period Weeks   Status Partially Met          Plan - 03/27/17 1251    Clinical Impression Statement The  patient is completing word finding tasks and sentence structure tasks with fair to good accuracy.  Patient demonstrates language errors including improper word usage, reduced flexibility for multiple meanings, and vague language.   Patient demonstrates mild spelling errors, including omitting morphological markers and letter/cognate substitutions.  Will plan to continue ST to focus on verbal expression.   Speech Therapy Frequency 2x / week   Duration Other (comment)  8 weeks   Treatment/Interventions Language facilitation;Internal/external aids;SLP instruction and feedback;Multimodal communcation approach;Functional tasks;Cognitive reorganization;Oral motor exercises;Cueing hierarchy;Patient/family education;Compensatory techniques   Potential to Achieve Goals Good   Potential Considerations Ability to learn/carryover information;Cooperation/participation level;Previous level of function;Severity of impairments;Family/community support   SLP Home Exercise Plan word finding task   Consulted and Agree with Plan of Care Patient      Patient will benefit from skilled therapeutic intervention in order to improve the following deficits and impairments:   Aphasia - Plan: SLP plan of care cert/re-cert    Problem List Patient Active Problem List   Diagnosis Date Noted  . Stenosis of right carotid artery 02/01/2017  . Acute embolic stroke ([place]) 02/33/4356  . Bradycardia 11/10/2015  . Essential tremor 06/03/2015  . Dizziness 08/21/2012  . Syncope 04/10/2011  . CALF PAIN, LEFT 10/11/2010  . DYSPEPSIA 07/31/2010  . CHEST DISCOMFORT 07/31/2010  . Hyperlipidemia 05/16/2010  . HYPERTENSION, BENIGN 05/16/2010  . CAD, NATIVE VESSEL 05/16/2010   Leroy Sea, MS/CCC- SLP  Lou Miner 03/27/2017, 1:00 PM  [place] MAIN [facility] SERVICES [address] [place], [place], [address] Phone: [phone]   Fax:  [phone]   Name: Meshulem Onorato [facility]. MRN: [id number] Date of Birth: 12-Mar-1933

## 2017-03-27 NOTE — Therapy (Addendum)
[place] MAIN [facility] SERVICES [address] [place], [place], [address] Phone: [phone]   Fax:  [phone]  Physical Therapy Treatment  Patient Details  Name: [name]. MRN: [id number] Date of Birth: 19-Apr-1933 Referring Provider: Launa Grill, MD  Encounter Date: 03/27/2017      PT End of Session - 03/27/17 1115    Visit Number 18   Number of Visits 33   Date for PT Re-Evaluation 05/20/17   Authorization Type 18/10   PT Start Time 1101   PT Stop Time 1139   PT Time Calculation (min) 38 min   Equipment Utilized During Treatment Gait belt   Activity Tolerance Patient tolerated treatment well;Patient limited by fatigue   Behavior During Therapy WFL for tasks assessed/performed      Past Medical History:  Diagnosis Date  . BPH (benign prostatic hyperplasia)   . Coronary artery disease   . Hypertension     Past Surgical History:  Procedure Laterality Date  . Intestinal Blockage    . SKIN CANCER EXCISION     x2  . TONSILLECTOMY      There were no vitals filed for this visit.      Subjective Assessment - 03/27/17 1113    Subjective Patient reports that he is doing better with his balance then he was when he first had his stroke.    Patient is accompained by: Family member   Pertinent History Subcortical hemorrhage of left cerebral hemisphere (01/05/2017), parkinson's, [facility] Spotted Fever, Basal cell CA, squamous cell CA, DDD cervical, OA lumbar, HTN, BPH, vertigo, CAD, peripheral neuropathy   Limitations Standing;Walking;House hold activities   Patient Stated Goals Walk in community, workout at Club, treadmill, work in yard,    Currently in Pain? Yes   Pain Score 3    Pain Location Back   Pain Orientation Lower   Pain Descriptors / Indicators Aching   Pain Type Chronic pain   Pain Onset More than a month ago   Pain Frequency Intermittent   Aggravating Factors  walking   Pain Relieving Factors  sitting   Effect of Pain on Daily Activities none   Multiple Pain Sites No      NEUROMUSCULAR RE-EDUCATION Airex NBOS eyes open/closed x 30 seconds each;cues for posture correction Airex NBOS eyes open horizontal head turns x 30 seconds;cues for posture correction Airex cone  reaching crossing midline cues for looking up  Toe tapping 6 inch stool without UE assist, cues for technique Tandem gait in // bars x 4 laps , posture correction cues Side stepping on blue  foam balance beam x 5 lengths of the parallel bars with posture correction cues 4 square fwd/bwd, side to side stepping/ diagonal stepping, cues to not step on the lines   Cues for proper technique of exercises, slow eccentric contractions to target specific muscles and facility increased muscle building. Therapeutic rest breaks for energy conservation  Therapeutic exericse: Leg press 120 lbs x 20 x 3, heel raises 90 lbs x 20 x 3 Squats x 20 x 2 Step ups to 6 inch stool x 20 x 2  Patient has some fatigue and tolerates treatment without increased pain behaviors. Patient needs CGA for all dynamic standing balance exercises.                            PT Education - 03/27/17 1114    Education provided Yes   Education  Details HEP for balance   Person(s) Educated Patient   Methods Explanation   Comprehension Verbalized understanding          PT Short Term Goals - 02/26/17 1424      PT SHORT TERM GOAL #1   Title Patient & wife demonstrate understanding of initial HEP. (Target Date: 02/22/2017)   Time 4   Period Weeks   Status Achieved     PT SHORT TERM GOAL #2   Title Cognitive Timed Up-Go <12 seconds.  (Target Date: 02/22/2017)   Baseline 12 sec   Time 4   Period Weeks   Status Achieved     PT SHORT TERM GOAL #3   Title Patient ambulates with head turns without balance loss & maintaining path.  (Target Date: 02/22/2017)   Baseline achieved   Time 4   Period Weeks   Status Achieved      PT SHORT TERM GOAL #4   Title Berg Balance >45/56 to indicate lower fall risk.   (Target Date: 02/22/2017)   Baseline 50/56   Time 4   Status Partially Met           PT Long Term Goals - 03/26/17 1422      PT LONG TERM GOAL #1   Title Patient demonstrates with wife's cueing ongoing exercise plan. (Target Date: 03/22/2017)   Time 8   Period Weeks   Status Achieved     PT LONG TERM GOAL #2   Title Berg Balance >/= 50/56 to indicate lower fall risk. (Target Date: 03/22/2017)   Baseline 50/56   Time 8   Period Weeks   Status Achieved     PT LONG TERM GOAL #3   Title Cognitive Timed Up-Go increases <25% from standard TUG to indicate lower fall risk.  (Target Date: 03/22/2017)   Baseline 12 sec   Time 8   Period Weeks   Status Achieved     PT LONG TERM GOAL #4   Title Functional Gait Assessment >20/30 indicating lower fall risk.  (Target Date: 03/22/2017)   Baseline 24   Time 8   Period Weeks   Status Achieved     PT LONG TERM GOAL #5   Title MiniBESTest >14/28 indicating lower fall risk.  (Target Date: 03/22/2017)   Baseline NT   Time 8   Period Weeks   Status On-going     PT LONG TERM GOAL #6   Title Patient reports low back pain with standing & walking </= 3/10 (Target Date: 03/22/2017)   Baseline 7/10   Time 8   Period Weeks   Status Achieved     PT LONG TERM GOAL #7   Title Patient will improve Berg balance by 6 points indicating improved dynamic standing balance.   Baseline 52/56   Time 8   Period Weeks   Status Partially Met               Plan - 03/27/17 1116    Clinical Impression Statement Patient required min verbal cueing during matrix machine stepping, and required CGA during all dynamic standing balance activities. Patient has 4+/5 strength BLE and dynamic standing balance is good.  Patient required occasional rest breaks between exercises due to fatigue. Patient tolerated exercises with some  rest periods. 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), parkinson's, [facility] Spotted Fever, Basal cell CA, squamous cell CA, DDD cervical, OA lumbar, HTN, BPH, vertigo, CAD, peripheral neuropathy   PT Frequency 2x / week   PT Duration 8 weeks   PT Treatment/Interventions ADLs/Self Care Home Management;Gait training;Stair training;Functional mobility training;Therapeutic activities;Therapeutic exercise;Balance training;Neuromuscular re-education;Patient/family education;Ultrasound;Moist Heat;Manual techniques   PT Next Visit Plan Work on walking speed, endurance on treadmill, gait outside and on compliant surfaces, PWR exercises   Consulted and Agree with Plan of Care Patient      Patient will benefit from skilled therapeutic intervention in order to improve the following deficits and impairments:  Abnormal gait, Decreased activity tolerance, Decreased balance, Decreased mobility, Decreased strength, Pain  Visit Diagnosis: Hemianopsia  Muscle weakness (generalized)  Other lack of coordination  Unsteadiness on feet     Problem List Patient Active Problem List   Diagnosis Date Noted  . Stenosis of right carotid artery 02/01/2017  . Acute embolic stroke ([place]) 48/18/5909  . Bradycardia 11/10/2015  . Essential tremor 06/03/2015  . Dizziness 08/21/2012  . Syncope 04/10/2011  . CALF PAIN, LEFT 10/11/2010  . DYSPEPSIA 07/31/2010  . CHEST DISCOMFORT 07/31/2010  . Hyperlipidemia 05/16/2010  . HYPERTENSION, BENIGN 05/16/2010  . CAD, NATIVE VESSEL 05/16/2010   Alanson Puls, PT, DPT [place], [place] S 03/28/2017, 10:48 AM  [place] MAIN [facility] SERVICES [address] [place], [place], [address] Phone: [phone]   Fax:   [phone]  Name: Sierra Bissonette [facility] MRN: [id number] Date of Birth: 1933-01-13

## 2017-03-27 NOTE — Telephone Encounter (Signed)
Two-week monitor shows no significant arrhythmia Very short runs of atrial tachycardia which should not have caused his stroke Note from neurology suggested if there was no atrial fibrillation that he be considered for implantable loop monitor I would recommend he meet with Dr. Graciela Husbands in [place] who puts in these monitors This would allow continued monitoring for arrhythmia for 3 years

## 2017-03-28 ENCOUNTER — Encounter: Payer: Medicare Other | Admitting: Occupational Therapy

## 2017-03-28 NOTE — Telephone Encounter (Signed)
Spoke w/ pt.  Advised him of Dr. Windell Hummingbird recommendation.  He states that he had a f/u w/ [facility] yesterday and they suggested the same thing. Offered to make appt w/ Dr. Graciela Husbands, but he states that his wife has the calendar, so he will have her call back to set this up.  Asked him to call back w/ any questions or concerns.

## 2017-03-28 NOTE — Addendum Note (Signed)
Addended by: Rhea Belton R on: 03/28/2017 09:28 AM   Modules accepted: Orders

## 2017-04-01 ENCOUNTER — Encounter: Payer: Medicare Other | Admitting: Occupational Therapy

## 2017-04-01 ENCOUNTER — Encounter: Payer: Self-pay | Admitting: Occupational Therapy

## 2017-04-01 ENCOUNTER — Ambulatory Visit: Payer: Medicare Other | Admitting: Physical Therapy

## 2017-04-01 ENCOUNTER — Ambulatory Visit: Payer: Medicare Other | Attending: Physical Medicine and Rehabilitation | Admitting: Occupational Therapy

## 2017-04-01 ENCOUNTER — Ambulatory Visit: Payer: Medicare Other | Admitting: Speech Pathology

## 2017-04-01 DIAGNOSIS — H5347 Heteronymous bilateral field defects: Secondary | ICD-10-CM | POA: Diagnosis not present

## 2017-04-01 DIAGNOSIS — R4701 Aphasia: Secondary | ICD-10-CM

## 2017-04-01 NOTE — Therapy (Signed)
[place] MAIN [facility] SERVICES [address] [place], [place], [address] Phone: [phone]   Fax:  [phone]  Occupational Therapy Treatment  Patient Details  Name: [name]. MRN: [id number] Date of Birth: 10-Jul-1933 Referring Provider: Dr. Rea College  Encounter Date: 04/01/2017      OT End of Session - 04/01/17 0929    Visit Number 16   Number of Visits 32   Date for OT Re-Evaluation 05/14/17   Authorization Type medicare G-code 4   OT Start Time 0915   OT Stop Time 1000   OT Time Calculation (min) 45 min   Activity Tolerance Patient tolerated treatment well   Behavior During Therapy [facility] for tasks assessed/performed      Past Medical History:  Diagnosis Date  . BPH (benign prostatic hyperplasia)   . Coronary artery disease   . Hypertension     Past Surgical History:  Procedure Laterality Date  . Intestinal Blockage    . SKIN CANCER EXCISION     x2  . TONSILLECTOMY      There were no vitals filed for this visit.      Subjective Assessment - 04/01/17 0926    Subjective  Pt. reports his wife is starting to feel better.   Patient is accompained by: Family member   Pertinent History see epic.  L CVA, R hemiopsia, Parkinson's, essential tremor   Patient Stated Goals I want to get well and be perfect again.  I hate these questions (pt unable to give specific answer)   Currently in Pain? Yes   Pain Score 3    Pain Location Back                      OT Treatments/Exercises (OP) - 04/01/17 0001      Visual/Perceptual Exercises   Other Exercises Pt. worked on visual scanning, and visual compensatory strategies in his extrapersonal space in the kitchen. Pt. required increased time, and verbal cues to complete. Pt. worked on visual scanning in near space for tabletop tasks including left to right eye movements when copying telephone numbers, and word searches with 1.74M. Pt. requires increased  time, and cues.                OT Education - 04/01/17 [phone]    Education provided Yes   Education Details Visual scanning, visual compensatory strategies   Person(s) Educated Patient   Methods Explanation   Comprehension Verbalized understanding          OT Short Term Goals - 02/14/17 1457      OT SHORT TERM GOAL #1   Title Pt and wife will be mod I with HEP - 02/21/2017   Time 4   Period Weeks   Status On-going  needs reinforcement of coordination HEP     OT SHORT TERM GOAL #2   Title Pt will be mod I with shower transfers   Status Achieved  met per pt report     OT SHORT TERM GOAL #3   Title Pt will be mod I with bathing   Status Achieved     OT SHORT TERM GOAL #4   Title Pt will complete table top scanning activity (not lanaguage or letter based) with 75% accuracy   Status Achieved  met for copying small peg design with min v.c.     OT SHORT TERM GOAL #5   Title Pt will attend to familiar task in busy  environment with no more than min vc's.     Status Achieved           OT Long Term Goals - 03/19/17 1114      OT LONG TERM GOAL #1   Title Pt and wife will be mod I with ugraded HEP prn - 03/21/2017   Time 8   Period Weeks   Status On-going     OT LONG TERM GOAL #2   Title Pt will demonstrate improved coordinaton by decreasing time on 9 hole peg test by 4 seconds (baseline=34.15)   Time 8   Period Weeks   Status Achieved     OT LONG TERM GOAL #3   Title Pt will be able to print/write name legibly AE prn- (revised goal) Pt will demonstrate ability to write a 4 sentence paragraph with 100% legibility, and no significant decr. in letter size using AE PRN.   Time 8   Period Weeks   Status Achieved     OT LONG TERM GOAL #4   Title Pt will demonstate ability to maintain balance on outdoor uneven surfaces in preparation for returning to simple yard work.    Time 8   Period Weeks   Status Deferred     OT LONG TERM GOAL #5   Title Pt will require  no more than 2 vc's to recall information from therapy session   Time 8   Period Weeks   Status On-going     OT LONG TERM GOAL #6   Title Pt will complete table top scanning activity (not language based) with 90% accuracy   Time 8   Period Weeks   Status On-going     OT LONG TERM GOAL #7   Title Pt will complete environmental scanning task with at least 75% accuracy.  Revised to 90% accuracy 02/14/17   Time 8   Period Weeks   Status On-going     OT LONG TERM GOAL #8   Title Pt will perform simple divided attention with environmental scanning for improved safety for ambulation/in prep for driving with at least 75% accuracy.   Baseline Pt. will demonstrate visual compensatory strategies 100% of the time during ADLs, and IADLs.    Time 8   Period Weeks   Status New               Plan - 04/01/17 0930    Clinical Impression Statement Pt. reports his legs have been feeling rubbery lately. Pt. reports that his wife doesn't notice it, and that maybe its just fatigue. Pt. continues to work on improving visual scanning, and visual compensatory startegies for extrapersonal space within his environment during IADL tasks, as well as in his near space for tabletop tasks in preparattion for reading, and writing.    Rehab Potential Good   OT Frequency 2x / week   OT Duration 8 weeks   OT Treatment/Interventions Self-care/ADL training;Electrical Stimulation;Therapeutic exercise;Neuromuscular education;DME and/or AE instruction;Energy conservation;Therapist, nutritional;Therapeutic activities;Cognitive remediation/compensation;Visual/perceptual remediation/compensation;Patient/family education;Balance training   Consulted and Agree with Plan of Care Patient      Patient will benefit from skilled therapeutic intervention in order to improve the following deficits and impairments:  Decreased balance, Decreased cognition, Decreased coordination, Decreased safety awareness, Decreased  mobility, Decreased knowledge of use of DME, Impaired UE functional use, Impaired vision/preception  Visit Diagnosis: Hemianopsia    Problem List Patient Active Problem List   Diagnosis Date Noted  . Stenosis of right carotid artery 02/01/2017  .  Acute embolic stroke ([place]) 64/40/3474  . Bradycardia 11/10/2015  . Essential tremor 06/03/2015  . Dizziness 08/21/2012  . Syncope 04/10/2011  . CALF PAIN, LEFT 10/11/2010  . DYSPEPSIA 07/31/2010  . CHEST DISCOMFORT 07/31/2010  . Hyperlipidemia 05/16/2010  . HYPERTENSION, BENIGN 05/16/2010  . CAD, NATIVE VESSEL 05/16/2010    Harrel Carina, MS, OTR/L 04/01/2017, 10:00 AM  [place] [address] [place], [place], [address] Phone: [phone]   Fax:  [phone]  Name: Bryar Dahms [facility]. MRN: [id number] Date of Birth: 01/30/1933

## 2017-04-01 NOTE — Therapy (Signed)
[place] MAIN [facility] SERVICES [address] [place], [place], [address] Phone: [phone]   Fax:  [phone]  Speech Language Pathology Treatment  Patient Details  Name: [name]. MRN: [id number] Date of Birth: [birth year] Referring Provider: Launa Grill, MD  Encounter Date: 04/01/2017      End of Session - 04/01/17 1230    Visit Number 17   Number of Visits 33   Date for SLP Re-Evaluation 05/27/17   SLP Start Time 1010   SLP Stop Time  1100   SLP Time Calculation (min) 50 min   Activity Tolerance Patient tolerated treatment well      Past Medical History:  Diagnosis Date  . BPH (benign prostatic hyperplasia)   . Coronary artery disease   . Hypertension     Past Surgical History:  Procedure Laterality Date  . Intestinal Blockage    . SKIN CANCER EXCISION     x2  . TONSILLECTOMY      There were no vitals filed for this visit.      Subjective Assessment - 04/01/17 1010    Subjective "I have a wife" in response to question about how he remembers to take his medications   Currently in Pain? Yes   Pain Score 4    Pain Location Back   Pain Orientation Lower   Pain Descriptors / Indicators Aching               ADULT SLP TREATMENT - 04/01/17 0001      General Information   Behavior/Cognition Alert;Cooperative;Pleasant mood     Treatment Provided   Treatment provided Cognitive-Linquistic     Pain Assessment   Pain Assessment 0-10   Pain Score 4    Pain Location low back   Pain Descriptors / Indicators Aching   Pain Intervention(s) Monitored during session     Cognitive-Linquistic Treatment   Treatment focused on Aphasia   Skilled Treatment Skilled ST intervention focused on naming category members and following written instructions. Pt named avg 8 items in concrete category in 60 seconds. Pt followed written instructions (drawing conclusions from word problems), requiring mod assistance for  attention to written detail and comprehension.      Assessment / Recommendations / Plan   Plan Continue with current plan of care     Progression Toward Goals   Progression toward goals Progressing toward goals          SLP Education - 04/01/17 1229    Education provided Yes   Education Details analogies and divergent naming worksheets   Person(s) Educated Patient   Methods Explanation;Demonstration;Verbal cues;Handout   Comprehension Verbalized understanding           SLP Long Term Goals - 04/01/17 1233      SLP LONG TERM GOAL #1   Title pt will use compensations for functional 10 minute mod-max complex conversation   Time 5   Period Weeks   Status On-going     SLP LONG TERM GOAL #2   Title  pt will demo [facility] tasks requiring memory compensations (meds, appointments, etc)   Status Achieved     SLP LONG TERM GOAL #3   Title pt will demo functional reading comprehension in 3-4 sentence paragraphs   Status Achieved     SLP LONG TERM GOAL #4   Title Patient will generate grammatical and meaningful sentences to complete moderate cognitive-linguistic task with 80% accruacy.   Time 7   Status Partially Met  SLP LONG TERM GOAL #5   Title Patient will complete high level word finding activities with 80% accuracy.   Time 7   Period Weeks   Status Partially Met          Plan - 04/01/17 1230    Clinical Impression Statement Pt pleasant and cooperative with unfamiliar therapist. Pt is aware of word retreival deficits, and is motivated in therapy. Pt continues to exhibit word finding errors during divergent categorization tasks, and requires moderate assistance to read information and answer questions correctly. Continued skilled ST intervention is recommended to facilitate effective communication and improve quality of life.   Speech Therapy Frequency 2x / week   Duration --  8 weeks   Treatment/Interventions Language facilitation;Internal/external aids;SLP  instruction and feedback;Multimodal communcation approach;Functional tasks;Cognitive reorganization;Oral motor exercises;Cueing hierarchy;Patient/family education;Compensatory techniques   Potential to Achieve Goals Good   Potential Considerations Ability to learn/carryover information;Cooperation/participation level;Previous level of function;Severity of impairments;Family/community support   SLP Home Exercise Plan word finding tasks   Consulted and Agree with Plan of Care Patient      Patient will benefit from skilled therapeutic intervention in order to improve the following deficits and impairments:   Aphasia    Problem List Patient Active Problem List   Diagnosis Date Noted  . Stenosis of right carotid artery 02/01/2017  . Acute embolic stroke ([place]) 40/37/0964  . Bradycardia 11/10/2015  . Essential tremor 06/03/2015  . Dizziness 08/21/2012  . Syncope 04/10/2011  . CALF PAIN, LEFT 10/11/2010  . DYSPEPSIA 07/31/2010  . CHEST DISCOMFORT 07/31/2010  . Hyperlipidemia 05/16/2010  . HYPERTENSION, BENIGN 05/16/2010  . CAD, NATIVE VESSEL 05/16/2010   Celia B. [place], [place], CCC-SLP  Shonna Chock 04/01/2017, 12:35 PM  [place] MAIN [facility] SERVICES [address] [place], [place], [address] Phone: [phone]   Fax:  [phone]   Name: [name] [facility]. MRN: [id number] Date of Birth: [birth year]

## 2017-04-01 NOTE — Patient Instructions (Signed)
OT TREATMENT     Neuro muscular re-education:  Therapeutic Exercise:  Selfcare:  Pt. worked on visual scanning, and visual compensatory strategies in his extrapersonal space in the kitchen. Pt. required increased time, and verbal cues to complete. Pt. worked on visual scanning in near space for tabletop tasks including left to right eye movements when copying telephone numbers, and word searches with 1.53M. Pt. requires increased time, and cues.  Manual Therapy:

## 2017-04-02 ENCOUNTER — Ambulatory Visit: Payer: Medicare Other | Admitting: Physical Therapy

## 2017-04-02 ENCOUNTER — Encounter: Payer: Medicare Other | Admitting: Occupational Therapy

## 2017-04-03 ENCOUNTER — Encounter: Payer: Self-pay | Admitting: Physical Therapy

## 2017-04-03 ENCOUNTER — Ambulatory Visit: Payer: Medicare Other | Admitting: Speech Pathology

## 2017-04-03 ENCOUNTER — Ambulatory Visit: Payer: Medicare Other | Attending: Physical Medicine and Rehabilitation | Admitting: Physical Therapy

## 2017-04-03 ENCOUNTER — Ambulatory Visit: Payer: Medicare Other | Attending: Physical Medicine and Rehabilitation | Admitting: Occupational Therapy

## 2017-04-03 DIAGNOSIS — R2681 Unsteadiness on feet: Secondary | ICD-10-CM | POA: Diagnosis not present

## 2017-04-03 DIAGNOSIS — M6281 Muscle weakness (generalized): Secondary | ICD-10-CM | POA: Insufficient documentation

## 2017-04-03 DIAGNOSIS — R4701 Aphasia: Secondary | ICD-10-CM

## 2017-04-03 DIAGNOSIS — H5347 Heteronymous bilateral field defects: Secondary | ICD-10-CM | POA: Insufficient documentation

## 2017-04-03 DIAGNOSIS — R41841 Cognitive communication deficit: Secondary | ICD-10-CM | POA: Insufficient documentation

## 2017-04-03 DIAGNOSIS — R278 Other lack of coordination: Secondary | ICD-10-CM

## 2017-04-03 NOTE — Therapy (Signed)
[place] MAIN [facility] SERVICES [address] [place], [name], [address] Phone: [phone]   Fax:  [phone]  Physical Therapy Treatment/ Discharge Summary Patient Details  Name: [name]. MRN: [id number] Date of Birth: Apr 28, 1933 Referring Provider: Launa Grill, MD  Encounter Date: 04/03/2017      PT End of Session - 04/03/17 1113    Visit Number 19   Number of Visits 33   Date for PT Re-Evaluation 05/20/17   Authorization Type 19/10   PT Start Time 1101   PT Stop Time 1144   PT Time Calculation (min) 43 min   Equipment Utilized During Treatment Gait belt   Activity Tolerance Patient tolerated treatment well;Patient limited by fatigue   Behavior During Therapy WFL for tasks assessed/performed      Past Medical History:  Diagnosis Date  . BPH (benign prostatic hyperplasia)   . Coronary artery disease   . Hypertension     Past Surgical History:  Procedure Laterality Date  . Intestinal Blockage    . SKIN CANCER EXCISION     x2  . TONSILLECTOMY      There were no vitals filed for this visit.      Subjective Assessment - 04/03/17 1100    Subjective Patient reports that he is doing better with his balance then he was when he first had his stroke. He is having 3/10 LBP.   Patient is accompained by: Family member   Pertinent History Subcortical hemorrhage of left cerebral hemisphere (01/05/2017), parkinson's, [facility] Spotted Fever, Basal cell CA, squamous cell CA, DDD cervical, OA lumbar, HTN, BPH, vertigo, CAD, peripheral neuropathy   Limitations Standing;Walking;House hold activities   Patient Stated Goals Walk in community, workout at Club, treadmill, work in yard,    Currently in Pain? Yes   Pain Score 3    Pain Location Back   Pain Orientation Lower   Pain Descriptors / Indicators Aching   Pain Type Chronic pain   Pain Onset More than a month ago   Pain Frequency Intermittent   Aggravating Factors   walking   Pain Relieving Factors sitting   Effect of Pain on Daily Activities none   Multiple Pain Sites No      NEUROMUSCULAR RE-ED Gait in hallway with horizontal and vertical head turns 80' x 2;cues for straight path and no deviations Toe taps on blue foam x 10 bilateral, alternating; cues for posture and foot position Modified tandem stance eyes open x 30 seconds each alternating LE; cues for posture Modified tandem stance eyes open with horizontal head turns alternating; cues for posture Side stepping on TM  with cues to not rotate hips and for correct head position Stepping onto AIREX, then on to 4 inch step followed by stepping down onto AIREX and then level surface in //bars x15.  Pt required occasional UE assist, and performance improved with each repetition   Stepping over and back x`10 bilaterally with verbal cueing to perform exercise as fast as possible Side step and back to 6 inch stool x10 bilaterally fwd and bil side stepping over 1/2 foam roll with no UE support, 1x15 each, CGA for balance, min cues for heel strike with fwd step over bil toe taps on 6" step, 2x10 each; cues for control of LE Star stepping left and right x 10 with CGA 4 square stepping fwd/bwd, side to side, diagonals x 10   Patient is able to perform all above exercises with CGa and  less use of UE support . No reports of increased back pain.  Reviewed Hep for DC today.                           PT Education - 04/03/17 1113    Education provided Yes   Education Details safety with steps   Person(s) Educated Patient   Methods Explanation   Comprehension Verbalized understanding          PT Short Term Goals - 02/26/17 1424      PT SHORT TERM GOAL #1   Title Patient & wife demonstrate understanding of initial HEP. (Target Date: 02/22/2017)   Time 4   Period Weeks   Status Achieved     PT SHORT TERM GOAL #2   Title Cognitive Timed Up-Go <12 seconds.  (Target Date:  02/22/2017)   Baseline 12 sec   Time 4   Period Weeks   Status Achieved     PT SHORT TERM GOAL #3   Title Patient ambulates with head turns without balance loss & maintaining path.  (Target Date: 02/22/2017)   Baseline achieved   Time 4   Period Weeks   Status Achieved     PT SHORT TERM GOAL #4   Title Berg Balance >45/56 to indicate lower fall risk.   (Target Date: 02/22/2017)   Baseline 50/56   Time 4   Status Partially Met           PT Long Term Goals - 04/03/17 1137      PT LONG TERM GOAL #1   Title Patient demonstrates with wife's cueing ongoing exercise plan. (Target Date: 03/22/2017)   Time 8   Period Weeks   Status Achieved     PT LONG TERM GOAL #2   Title Berg Balance >/= 50/56 to indicate lower fall risk. (Target Date: 03/22/2017)   Baseline 52/56   Time 8   Period Weeks   Status Partially Met     PT LONG TERM GOAL #3   Title Cognitive Timed Up-Go increases <25% from standard TUG to indicate lower fall risk.  (Target Date: 03/22/2017)   Baseline 11.30 sec   Time 8   Period Weeks   Status Achieved     PT LONG TERM GOAL #4   Title Functional Gait Assessment >20/30 indicating lower fall risk.  (Target Date: 03/22/2017)   Baseline 24   Time 8   Period Weeks   Status Achieved     PT LONG TERM GOAL #5   Baseline NT     PT LONG TERM GOAL #6   Title Patient reports low back pain with standing & walking </= 3/10 (Target Date: 03/22/2017)   Baseline 3/10   Time 8   Period Weeks   Status Achieved     PT LONG TERM GOAL #7   Title Patient will improve Berg balance by 6 points indicating improved dynamic standing balance.   Baseline 52/56   Time 8   Period Weeks   Status Partially Met               Plan - 04/03/17 1114    Clinical Impression Statement Pt demonstrates difficulty with weight shifting balance and well as lack of coordination with side stepping using LLE with head turns but overall is improving with less use of UE for support, and has  less toe out of RLE. He is able to improve his sequencing with alternating toe taps  and 4 square improve with increased repetition today. Pt encouraged to continue with HEP.  Patient has partially met his goals #2,#7  due to lack of coordination and deficits in dynamic standing balance affected by head turns, and met goal #1,#3,,#4, #6 and will be DC today from PT to HEP .    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), parkinson's, [facility] Spotted Fever, Basal cell CA, squamous cell CA, DDD cervical, OA lumbar, HTN, BPH, vertigo, CAD, peripheral neuropathy   PT Frequency 2x / week   PT Duration 8 weeks   PT Treatment/Interventions ADLs/Self Care Home Management;Gait training;Stair training;Functional mobility training;Therapeutic activities;Therapeutic exercise;Balance training;Neuromuscular re-education;Patient/family education;Ultrasound;Moist Heat;Manual techniques   PT Next Visit Plan Work on walking speed, endurance on treadmill, gait outside and on compliant surfaces, PWR exercises   Consulted and Agree with Plan of Care Patient      Patient will benefit from skilled therapeutic intervention in order to improve the following deficits and impairments:  Abnormal gait, Decreased activity tolerance, Decreased balance, Decreased mobility, Decreased strength, Pain  Visit Diagnosis: Hemianopsia  Aphasia  Muscle weakness (generalized)  Other lack of coordination  Unsteadiness on feet     Problem List Patient Active Problem List   Diagnosis Date Noted  . Stenosis of right carotid artery 02/01/2017  . Acute embolic stroke ([place]) 15/37/9432  . Bradycardia 11/10/2015  . Essential tremor 06/03/2015  . Dizziness 08/21/2012  . Syncope 04/10/2011  . CALF PAIN, LEFT 10/11/2010  . DYSPEPSIA 07/31/2010  . CHEST DISCOMFORT 07/31/2010  . Hyperlipidemia 05/16/2010  . HYPERTENSION, BENIGN 05/16/2010   . CAD, NATIVE VESSEL 05/16/2010  Alanson Puls, PT, DPT  [place], Minette Headland S 04/03/2017, 11:45 AM  [place] MAIN [facility] SERVICES [address] [place], [name], [name]  Name: [place] Ladouceur [facility]. MRN: [id number] Date of Birth: Sep 08, 1933

## 2017-04-03 NOTE — Patient Instructions (Signed)
OT TREATMENT     Neuro muscular re-education:  Therapeutic Exercise:  Selfcare:  Pt. worked on visual scanning tasks 1.53M in his near space for tabletop ADL tasks in preparation for reading, and writing. Pt. requires increased time to complete. Pt. continues to require cues for right sided awareness, as pt. has multiple omissions on the right. Pt. Worked on saccades left to right at 6" apart, shoulder with, and 22" apart. Pt. Reports he able to see them, however does not see them completely lined up. Pt. Requires cues for 22" apart.  Manual Therapy:

## 2017-04-03 NOTE — Therapy (Signed)
[place] [facility] MAIN [facility] SERVICES [address] [place], [place], [address] Phone: [phone]   Fax:  [phone]  Occupational Therapy Treatment  Patient Details  Name: [name]. MRN: [id number] Date of Birth: [birth year] Referring Provider: Dr. Wilfrid Lund  Encounter Date: 04/03/2017      OT End of Session - 04/03/17 0929    Visit Number 17   Number of Visits 32   Date for OT Re-Evaluation 05/14/17   Authorization Type medicare G-code 5   OT Start Time 0915   OT Stop Time 1000   OT Time Calculation (min) 45 min   Activity Tolerance Patient tolerated treatment well   Behavior During Therapy [facility] for tasks assessed/performed      Past Medical History:  Diagnosis Date  . BPH (benign prostatic hyperplasia)   . Coronary artery disease   . Hypertension     Past Surgical History:  Procedure Laterality Date  . Intestinal Blockage    . SKIN CANCER EXCISION     x2  . TONSILLECTOMY      There were no vitals filed for this visit.      Subjective Assessment - 04/03/17 0925    Subjective  Pt. reports he will follow-up with his MD in June, and find out about driving. Pt. reports he thinks he is seeing better, however  reports that he is not seeeing as well in his right , as he is in his left.   Patient is accompained by: Family member   Pertinent History see epic.  L CVA, R hemiopsia, Parkinson's, essential tremor   Patient Stated Goals I want to get well and be perfect again.  I hate these questions (pt unable to give specific answer)   Currently in Pain? Yes   Pain Score 2    Pain Location Back   Pain Orientation Lower                      OT Treatments/Exercises (OP) - 04/03/17 0959      ADLs   ADL Comments Pt. worked on visual scanning tasks 1.61M in his near space for tabletop ADL tasks in preparation for reading, and writing. Pt. requires increased time to complete. Pt. continues to require cues for  right sided awareness, as pt. has multiple omissions on the right. Pt. Worked on saccades left to right at 6" apart, shoulder with, and 22" apart. Pt. Reports he able to see them, however does not see them completely lined up. Pt. Requires cues for 22" apart.                OT Education - 04/03/17 [id number]    Education provided Yes   Education Details visual scanning, and visual compensatory strategies.   Person(s) Educated Patient   Methods Explanation;Demonstration;Verbal cues   Comprehension Verbalized understanding             OT Long Term Goals - 03/19/17 1114      OT LONG TERM GOAL #1   Title Pt and wife will be mod I with ugraded HEP prn - 03/21/2017   Time 8   Period Weeks   Status On-going     OT LONG TERM GOAL #2   Title Pt will demonstrate improved coordinaton by decreasing time on 9 hole peg test by 4 seconds (baseline=34.15)   Time 8   Period Weeks   Status Achieved     OT LONG TERM GOAL #3  Title Pt will be able to print/write name legibly AE prn- (revised goal) Pt will demonstrate ability to write a 4 sentence paragraph with 100% legibility, and no significant decr. in letter size using AE PRN.   Time 8   Period Weeks   Status Achieved     OT LONG TERM GOAL #4   Title Pt will demonstate ability to maintain balance on outdoor uneven surfaces in preparation for returning to simple yard work.    Time 8   Period Weeks   Status Deferred     OT LONG TERM GOAL #5   Title Pt will require no more than 2 vc's to recall information from therapy session   Time 8   Period Weeks   Status On-going     OT LONG TERM GOAL #6   Title Pt will complete table top scanning activity (not language based) with 90% accuracy   Time 8   Period Weeks   Status On-going     OT LONG TERM GOAL #7   Title Pt will complete environmental scanning task with at least 75% accuracy.  Revised to 90% accuracy 02/14/17   Time 8   Period Weeks   Status On-going     OT LONG TERM  GOAL #8   Title Pt will perform simple divided attention with environmental scanning for improved safety for ambulation/in prep for driving with at least 40% accuracy.   Baseline Pt. will demonstrate visual compensatory strategies 100% of the time during ADLs, and IADLs.    Time 8   Period Weeks   Status New               Plan - 04/03/17 0930    Clinical Impression Statement Pt. reports he is having difficulty seeing the TV. Pt. continues to work on improving visual scanning to the right, and visual compensatory strategies. Pt. requires verbal cues to move his head, and eyes when scanning to the right rather than moving the paper to the left into his visual field. Pt. continues to have ommissions to the right. Pt. continues to work on improving visual scanning, and visual compensatory skills for ADL, and IADL functioning.   Rehab Potential Good   Clinical Impairments Affecting Rehab Potential decr awareness/   OT Frequency 2x / week   OT Duration 8 weeks   OT Treatment/Interventions Self-care/ADL training;Electrical Stimulation;Therapeutic exercise;Neuromuscular education;DME and/or AE instruction;Energy conservation;Building services engineer;Therapeutic activities;Cognitive remediation/compensation;Visual/perceptual remediation/compensation;Patient/family education;Balance training   Consulted and Agree with Plan of Care Patient      Patient will benefit from skilled therapeutic intervention in order to improve the following deficits and impairments:  Decreased balance, Decreased cognition, Decreased coordination, Decreased safety awareness, Decreased mobility, Decreased knowledge of use of DME, Impaired UE functional use, Impaired vision/preception  Visit Diagnosis: Hemianopsia    Problem List Patient Active Problem List   Diagnosis Date Noted  . Stenosis of right carotid artery 02/01/2017  . Acute embolic stroke ([facility]) 02/01/2017  . Bradycardia 11/10/2015  . Essential  tremor 06/03/2015  . Dizziness 08/21/2012  . Syncope 04/10/2011  . CALF PAIN, LEFT 10/11/2010  . DYSPEPSIA 07/31/2010  . CHEST DISCOMFORT 07/31/2010  . Hyperlipidemia 05/16/2010  . HYPERTENSION, BENIGN 05/16/2010  . CAD, NATIVE VESSEL 05/16/2010    Olegario Messier, MS, OTR/L 04/03/2017, 12:16 PM  [place] [facility] MAIN [facility] SERVICES [address] [place], [place], [address] Phone: [phone]   Fax:  [phone]  Name: [name] [facility]. MRN: [id number] Date of  Birth: [birth year]

## 2017-04-04 ENCOUNTER — Encounter: Payer: Self-pay | Admitting: Speech Pathology

## 2017-04-04 NOTE — Therapy (Signed)
[place] MAIN [facility] SERVICES [address] [place], [place], [address] Phone: [phone]   Fax:  [phone]  Speech Language Pathology Treatment  Patient Details  Name: [name]. MRN: [id number] Date of Birth: Apr 18, 1933 Referring Provider: Launa Grill, MD  Encounter Date: 04/03/2017      End of Session - 04/04/17 1120    Visit Number 18   Number of Visits 33   Date for SLP Re-Evaluation 05/27/17   SLP Start Time 1000   SLP Stop Time  1100   SLP Time Calculation (min) 60 min   Activity Tolerance Patient tolerated treatment well      Past Medical History:  Diagnosis Date  . BPH (benign prostatic hyperplasia)   . Coronary artery disease   . Hypertension     Past Surgical History:  Procedure Laterality Date  . Intestinal Blockage    . SKIN CANCER EXCISION     x2  . TONSILLECTOMY      There were no vitals filed for this visit.      Subjective Assessment - 04/04/17 1119    Subjective The patient is frustrated by his slowed processing time and mild word finding problems   Currently in Pain? No/denies               ADULT SLP TREATMENT - 04/04/17 0001      General Information   Behavior/Cognition Alert;Cooperative;Pleasant mood     Treatment Provided   Treatment provided Cognitive-Linquistic     Pain Assessment   Pain Assessment No/denies pain     Cognitive-Linquistic Treatment   Treatment focused on Aphasia   Skilled Treatment SENTENCE STRUCTURE: Write sentence to complete moderately complex cognitive-linguistic task with 70% accuracy.  Patient demonstrates language errors including improper word usage, reduced flexibility for multiple meanings, and vague language.  Patient demonstrates spelling errors, including omitting morphological markers and letter/cognate substitutions.  WORD FINDING: Complete word finding tasks (analogies, word deduction) with 80% accuracy.  NUMBERS:  completes math word  problems with 95% accuracy given extra processing time.     Assessment / Recommendations / Plan   Plan Continue with current plan of care     Progression Toward Goals   Progression toward goals Progressing toward goals          SLP Education - 04/04/17 1119    Education provided Yes   Education Details Patience   Person(s) Educated Patient   Methods Explanation   Comprehension Verbalized understanding          SLP Short Term Goals - 04/01/17 1015      SLP SHORT TERM GOAL #1   Baseline 04/01/17 - avg 8          SLP Long Term Goals - 04/01/17 1233      SLP LONG TERM GOAL #1   Title pt will use compensations for functional 10 minute mod-max complex conversation   Time 5   Period Weeks   Status On-going     SLP LONG TERM GOAL #2   Title  pt will demo [facility] tasks requiring memory compensations (meds, appointments, etc)   Status Achieved     SLP LONG TERM GOAL #3   Title pt will demo functional reading comprehension in 3-4 sentence paragraphs   Status Achieved     SLP LONG TERM GOAL #4   Title Patient will generate grammatical and meaningful sentences to complete moderate cognitive-linguistic task with 80% accruacy.   Time 7  Status Partially Met     SLP LONG TERM GOAL #5   Title Patient will complete high level word finding activities with 80% accuracy.   Time 7   Period Weeks   Status Partially Met          Plan - 04/04/17 1120    Clinical Impression Statement The patient is completing word finding tasks, sentence structure tasks, and math language tasks with fair to good accuracy.  The patient expresses frustration that it takes him longer to complete relatively simple language tasks.  Patient demonstrates language errors including improper word usage, reduced flexibility for multiple meanings, and vague language.   Patient demonstrates mild spelling errors, including omitting morphological markers and letter/cognate substitutions.  Will plan to continue ST  to focus on verbal expression and math language.   Speech Therapy Frequency 2x / week   Duration Other (comment)   Treatment/Interventions Language facilitation;Internal/external aids;SLP instruction and feedback;Multimodal communcation approach;Functional tasks;Cognitive reorganization;Oral motor exercises;Cueing hierarchy;Patient/family education;Compensatory techniques   Potential to Achieve Goals Good   Potential Considerations Ability to learn/carryover information;Cooperation/participation level;Previous level of function;Severity of impairments;Family/community support   SLP Home Exercise Plan simple arithmetic drill   Consulted and Agree with Plan of Care Patient      Patient will benefit from skilled therapeutic intervention in order to improve the following deficits and impairments:   Aphasia    Problem List Patient Active Problem List   Diagnosis Date Noted  . Stenosis of right carotid artery 02/01/2017  . Acute embolic stroke ([facility]) 34/02/5247  . Bradycardia 11/10/2015  . Essential tremor 06/03/2015  . Dizziness 08/21/2012  . Syncope 04/10/2011  . CALF PAIN, LEFT 10/11/2010  . DYSPEPSIA 07/31/2010  . CHEST DISCOMFORT 07/31/2010  . Hyperlipidemia 05/16/2010  . HYPERTENSION, BENIGN 05/16/2010  . CAD, NATIVE VESSEL 05/16/2010   Leroy Sea, MS/CCC- SLP  Lou Miner 04/04/2017, [place] MAIN [facility] SERVICES [address] [place], [place], [address] Phone: [phone]   Fax:  [phone]   Name: [name]. MRN: [id number] Date of Birth: 1933-05-13

## 2017-04-08 ENCOUNTER — Ambulatory Visit: Payer: Medicare Other | Admitting: Physical Therapy

## 2017-04-08 ENCOUNTER — Encounter: Payer: Medicare Other | Admitting: Speech Pathology

## 2017-04-08 ENCOUNTER — Encounter: Payer: Medicare Other | Admitting: Occupational Therapy

## 2017-04-10 ENCOUNTER — Encounter: Payer: Medicare Other | Admitting: Occupational Therapy

## 2017-04-10 ENCOUNTER — Encounter: Payer: Medicare Other | Admitting: Speech Pathology

## 2017-04-10 ENCOUNTER — Ambulatory Visit: Payer: Medicare Other | Admitting: Physical Therapy

## 2017-04-15 ENCOUNTER — Ambulatory Visit: Payer: Medicare Other | Admitting: Occupational Therapy

## 2017-04-15 ENCOUNTER — Ambulatory Visit: Payer: Medicare Other | Admitting: Physical Therapy

## 2017-04-15 ENCOUNTER — Encounter: Payer: Medicare Other | Admitting: Speech Pathology

## 2017-04-17 ENCOUNTER — Ambulatory Visit: Payer: Medicare Other | Admitting: Occupational Therapy

## 2017-04-17 ENCOUNTER — Ambulatory Visit: Payer: Medicare Other | Admitting: Physical Therapy

## 2017-04-17 ENCOUNTER — Ambulatory Visit: Payer: Medicare Other | Admitting: Speech Pathology

## 2017-04-17 DIAGNOSIS — M6281 Muscle weakness (generalized): Secondary | ICD-10-CM

## 2017-04-17 DIAGNOSIS — R41841 Cognitive communication deficit: Secondary | ICD-10-CM

## 2017-04-17 DIAGNOSIS — H5347 Heteronymous bilateral field defects: Secondary | ICD-10-CM | POA: Diagnosis not present

## 2017-04-17 DIAGNOSIS — R4701 Aphasia: Secondary | ICD-10-CM

## 2017-04-17 DIAGNOSIS — R278 Other lack of coordination: Secondary | ICD-10-CM

## 2017-04-17 NOTE — Therapy (Signed)
[place] MAIN [facility] SERVICES [address] [place], [place], [address] Phone: [phone]   Fax:  [phone]  Occupational Therapy Treatment  Patient Details  Name: [name]. MRN: [id number] Date of Birth: 04/13/1933 Referring Provider: Dr. Rea College  Encounter Date: 04/17/2017      OT End of Session - 04/17/17 0929    Visit Number 18   Number of Visits 32   Date for OT Re-Evaluation 05/14/17   Authorization Type medicare G-code 6   OT Start Time 0917   OT Stop Time 1000   OT Time Calculation (min) 43 min   Activity Tolerance Patient tolerated treatment well   Behavior During Therapy [facility] for tasks assessed/performed      Past Medical History:  Diagnosis Date  . BPH (benign prostatic hyperplasia)   . Coronary artery disease   . Hypertension     Past Surgical History:  Procedure Laterality Date  . Intestinal Blockage    . SKIN CANCER EXCISION     x2  . TONSILLECTOMY      There were no vitals filed for this visit.      Subjective Assessment - 04/17/17 0924    Subjective  Pt. just returned from [place].   Pertinent History see epic.  L CVA, R hemiopsia, Parkinson's, essential tremor   Patient Stated Goals I want to get well and be perfect again.  I hate these questions (pt unable to give specific answer)   Currently in Pain? Yes   Pain Score 2    Pain Location Back   Pain Orientation Lower     OT Treatment  Self care:  Pt. worked on visual scanning tasks in his near space in preparation for tabletop ADL tasks. Pt. requires increased time to complete. Pt. continues to require cues for right sided awareness, as pt. had multiple omissions on the right, and in the center. Pt. Worked on tabletop word search 26M tasks, and read aloud tasks in 1.15M.                            OT Short Term Goals - 02/14/17 1457      OT SHORT TERM GOAL #1   Title Pt and wife will be mod I with HEP -  02/21/2017   Time 4   Period Weeks   Status On-going  needs reinforcement of coordination HEP     OT SHORT TERM GOAL #2   Title Pt will be mod I with shower transfers   Status Achieved  met per pt report     OT SHORT TERM GOAL #3   Title Pt will be mod I with bathing   Status Achieved     OT SHORT TERM GOAL #4   Title Pt will complete table top scanning activity (not lanaguage or letter based) with 75% accuracy   Status Achieved  met for copying small peg design with min v.c.     OT SHORT TERM GOAL #5   Title Pt will attend to familiar task in busy environment with no more than min vc's.     Status Achieved           OT Long Term Goals - 03/19/17 1114      OT LONG TERM GOAL #1   Title Pt and wife will be mod I with ugraded HEP prn - 03/21/2017   Time 8   Period Weeks  Status On-going     OT LONG TERM GOAL #2   Title Pt will demonstrate improved coordinaton by decreasing time on 9 hole peg test by 4 seconds (baseline=34.15)   Time 8   Period Weeks   Status Achieved     OT LONG TERM GOAL #3   Title Pt will be able to print/write name legibly AE prn- (revised goal) Pt will demonstrate ability to write a 4 sentence paragraph with 100% legibility, and no significant decr. in letter size using AE PRN.   Time 8   Period Weeks   Status Achieved     OT LONG TERM GOAL #4   Title Pt will demonstate ability to maintain balance on outdoor uneven surfaces in preparation for returning to simple yard work.    Time 8   Period Weeks   Status Deferred     OT LONG TERM GOAL #5   Title Pt will require no more than 2 vc's to recall information from therapy session   Time 8   Period Weeks   Status On-going     OT LONG TERM GOAL #6   Title Pt will complete table top scanning activity (not language based) with 90% accuracy   Time 8   Period Weeks   Status On-going     OT LONG TERM GOAL #7   Title Pt will complete environmental scanning task with at least 75% accuracy.   Revised to 90% accuracy 02/14/17   Time 8   Period Weeks   Status On-going     OT LONG TERM GOAL #8   Title Pt will perform simple divided attention with environmental scanning for improved safety for ambulation/in prep for driving with at least 75% accuracy.   Baseline Pt. will demonstrate visual compensatory strategies 100% of the time during ADLs, and IADLs.    Time 8   Period Weeks   Status New               Plan - 04/17/17 0930    Clinical Impression Statement Pt. has just returned from visiting his family in [place]. Pt. has a follow-up eye appoinment on Friday at [facility]. Pt. continues to require assist, and cues for right sided awareness, and visual scanning.  Pt. worked on visual scanning, and visual compensatory strategies in near space for tabletop tasks in preparation for ADL/IADL tasks. Pt. requires increased time to complete, and had multiple omissions to the right, and in the center while using a horizontal rectilinear scanning pattern from the left to right.   Rehab Potential Good   Clinical Impairments Affecting Rehab Potential decr awareness/   OT Frequency 2x / week   OT Treatment/Interventions Self-care/ADL training;Electrical Stimulation;Therapeutic exercise;Neuromuscular education;DME and/or AE instruction;Energy conservation;Therapist, nutritional;Therapeutic activities;Cognitive remediation/compensation;Visual/perceptual remediation/compensation;Patient/family education;Balance training   Consulted and Agree with Plan of Care Patient   Family Member Consulted wife [name]      Patient will benefit from skilled therapeutic intervention in order to improve the following deficits and impairments:  Decreased balance, Decreased cognition, Decreased coordination, Decreased safety awareness, Decreased mobility, Decreased knowledge of use of DME, Impaired UE functional use, Impaired vision/preception  Visit Diagnosis: Muscle weakness (generalized)  Other lack of  coordination    Problem List Patient Active Problem List   Diagnosis Date Noted  . Stenosis of right carotid artery 02/01/2017  . Acute embolic stroke ([place]) 72/62/0355  . Bradycardia 11/10/2015  . Essential tremor 06/03/2015  . Dizziness 08/21/2012  . Syncope 04/10/2011  . CALF PAIN,  LEFT 10/11/2010  . DYSPEPSIA 07/31/2010  . CHEST DISCOMFORT 07/31/2010  . Hyperlipidemia 05/16/2010  . HYPERTENSION, BENIGN 05/16/2010  . CAD, NATIVE VESSEL 05/16/2010    Harrel Carina, MS, OTR/L 04/17/2017, 10:05 AM  [place] MAIN [facility] SERVICES [address] [place], [place], [address] Phone: [phone]   Fax:  [phone]  Name: Dagmawi Venable [facility]. MRN: [id number] Date of Birth: [birth year]

## 2017-04-18 ENCOUNTER — Encounter: Payer: Self-pay | Admitting: Speech Pathology

## 2017-04-18 NOTE — Therapy (Signed)
[place] MAIN [facility] SERVICES [address] [place], [place], [address] Phone: [phone]   Fax:  [phone]  Speech Language Pathology Treatment  Patient Details  Name: [name]. MRN: [id number] Date of Birth: 01-21-1933 Referring Provider: Launa Grill, MD  Encounter Date: 04/17/2017      End of Session - 04/18/17 0941    Visit Number 19   Number of Visits 33   Date for SLP Re-Evaluation 05/27/17   SLP Start Time 1000   SLP Stop Time  1100   SLP Time Calculation (min) 60 min   Activity Tolerance Patient tolerated treatment well      Past Medical History:  Diagnosis Date  . BPH (benign prostatic hyperplasia)   . Coronary artery disease   . Hypertension     Past Surgical History:  Procedure Laterality Date  . Intestinal Blockage    . SKIN CANCER EXCISION     x2  . TONSILLECTOMY      There were no vitals filed for this visit.      Subjective Assessment - 04/18/17 0941    Subjective The patient is frustrated by his slowed processing time and mild word finding problems   Currently in Pain? No/denies               ADULT SLP TREATMENT - 04/18/17 0001      General Information   Behavior/Cognition Alert;Cooperative;Pleasant mood     Treatment Provided   Treatment provided Cognitive-Linquistic     Pain Assessment   Pain Assessment No/denies pain     Cognitive-Linquistic Treatment   Treatment focused on Aphasia   Skilled Treatment SENTENCE STRUCTURE: Express a problem he encountered while trying to calculate time with SLP cues to clarify meaning.  Patient demonstrates language errors including improper word usage, reduced flexibility for multiple meanings, and vague language.  NUMBERS:  completes simple math word problems with 100% accuracy given minimal extra processing time.  Completes more difficult math word problems with SLP cues for recall of basic arithmetic, reasoning to complete problem,  and cues to break task down as needed.     Assessment / Recommendations / Plan   Plan Continue with current plan of care     Progression Toward Goals   Progression toward goals Progressing toward goals          SLP Education - 04/18/17 0941    Education provided Yes   Education Details Need to be patient- may need to be more thoughtful and analytical to complete tasks that formerly were simple.   Person(s) Educated Patient   Methods Explanation   Comprehension Verbalized understanding          SLP Short Term Goals - 04/01/17 1015      SLP SHORT TERM GOAL #1   Baseline 04/01/17 - avg 8          SLP Long Term Goals - 04/01/17 1233      SLP LONG TERM GOAL #1   Title pt will use compensations for functional 10 minute mod-max complex conversation   Time 5   Period Weeks   Status On-going     SLP LONG TERM GOAL #2   Title  pt will demo [facility] tasks requiring memory compensations (meds, appointments, etc)   Status Achieved     SLP LONG TERM GOAL #3   Title pt will demo functional reading comprehension in 3-4 sentence paragraphs   Status Achieved     SLP LONG  TERM GOAL #4   Title Patient will generate grammatical and meaningful sentences to complete moderate cognitive-linguistic task with 80% accruacy.   Time 7   Status Partially Met     SLP LONG TERM GOAL #5   Title Patient will complete high level word finding activities with 80% accuracy.   Time 7   Period Weeks   Status Partially Met          Plan - 04/18/17 0942    Clinical Impression Statement The patient is completing word finding tasks, sentence structure tasks, and math language tasks with fair to good accuracy.  The patient expresses frustration that it takes him longer to complete relatively simple language tasks.  Patient demonstrates language errors including improper word usage, reduced flexibility for multiple meanings, and vague language.   Patient demonstrates mild spelling errors, including  omitting morphological markers and letter/cognate substitutions.  Will plan to continue ST to focus on verbal expression and math language.   Speech Therapy Frequency 2x / week   Duration Other (comment)   Treatment/Interventions Language facilitation;Internal/external aids;SLP instruction and feedback;Multimodal communcation approach;Functional tasks;Cognitive reorganization;Oral motor exercises;Cueing hierarchy;Patient/family education;Compensatory techniques   Potential to Achieve Goals Good   Potential Considerations Ability to learn/carryover information;Cooperation/participation level;Previous level of function;Severity of impairments;Family/community support   SLP Home Exercise Plan simple arithmetic drill   Consulted and Agree with Plan of Care Patient      Patient will benefit from skilled therapeutic intervention in order to improve the following deficits and impairments:   Aphasia  Cognitive communication deficit    Problem List Patient Active Problem List   Diagnosis Date Noted  . Stenosis of right carotid artery 02/01/2017  . Acute embolic stroke ([place]) 28/20/6015  . Bradycardia 11/10/2015  . Essential tremor 06/03/2015  . Dizziness 08/21/2012  . Syncope 04/10/2011  . CALF PAIN, LEFT 10/11/2010  . DYSPEPSIA 07/31/2010  . CHEST DISCOMFORT 07/31/2010  . Hyperlipidemia 05/16/2010  . HYPERTENSION, BENIGN 05/16/2010  . CAD, NATIVE VESSEL 05/16/2010   Leroy Sea, MS/CCC- SLP  Lou Miner 04/18/2017, 9:43 AM  [place] MAIN [facility] SERVICES [address] [place], [place], [address] Phone: [phone]   Fax:  [phone]   Name: Calvin Beaulieu [facility] MRN: [id number] Date of Birth: 1933-05-13

## 2017-04-19 ENCOUNTER — Ambulatory Visit: Payer: Medicare Other

## 2017-04-19 ENCOUNTER — Encounter: Payer: Self-pay | Admitting: Speech Pathology

## 2017-04-19 ENCOUNTER — Ambulatory Visit: Payer: Medicare Other | Admitting: Speech Pathology

## 2017-04-19 DIAGNOSIS — R4701 Aphasia: Secondary | ICD-10-CM

## 2017-04-19 DIAGNOSIS — H5347 Heteronymous bilateral field defects: Secondary | ICD-10-CM | POA: Diagnosis not present

## 2017-04-19 NOTE — Therapy (Signed)
[place] MAIN [facility] SERVICES [address] [place], [place], [address] Phone: [phone]   Fax:  [phone]  Speech Language Pathology Treatment/Progress Note  Patient Details  Name: [name]. MRN: [id number] Date of Birth: Nov 04, 1933 Referring Provider: Launa Grill, MD  Encounter Date: 04/19/2017      End of Session - 04/19/17 1357    Visit Number 20   Number of Visits 33   Date for SLP Re-Evaluation 05/27/17   Activity Tolerance Patient tolerated treatment well      Past Medical History:  Diagnosis Date  . BPH (benign prostatic hyperplasia)   . Coronary artery disease   . Hypertension     Past Surgical History:  Procedure Laterality Date  . Intestinal Blockage    . SKIN CANCER EXCISION     x2  . TONSILLECTOMY      There were no vitals filed for this visit.      Subjective Assessment - 04/19/17 1354    Subjective The patient was alert and ready to work. He expressed frustration a few times while having word-finding difficulties and during some of the math problems, remarking on how easy some of the problems should have been for him.   Currently in Pain? No/denies               ADULT SLP TREATMENT - 04/19/17 0001      General Information   Behavior/Cognition Alert;Cooperative;Pleasant mood     Treatment Provided   Treatment provided Cognitive-Linquistic     Pain Assessment   Pain Assessment No/denies pain     Cognitive-Linquistic Treatment   Treatment focused on Aphasia   Skilled Treatment The patient was able to correctly verbalize the main theme/story of the unfamiliar pictures in 4 out of 9 pictures without prompting. He required skilled cueing for 4 words that were important objects in the pictures, which the clinician did by providing context (this has a small flame, it's a scented what? for "candle") or by providing the initial sound of the word ("or" for "ornament"). The patient was  able to answer 4 of the 7 math word problems correctly without assistance from the clinician. The clinician provided strategies for removing unneeded information from the math problems and for simplifying the problem to fit his current abilities.     Assessment / Recommendations / Plan   Plan Continue with current plan of care     Progression Toward Goals   Progression toward goals Progressing toward goals          SLP Education - 04/19/17 1354    Education provided Yes   Education Details Word finding strategies   Person(s) Educated Patient   Methods Explanation   Comprehension Verbalized understanding          SLP Short Term Goals - 04/01/17 1015      SLP SHORT TERM GOAL #1   Baseline 04/01/17 - avg 8          SLP Long Term Goals - 04/01/17 1233      SLP LONG TERM GOAL #1   Title pt will use compensations for functional 10 minute mod-max complex conversation   Time 5   Period Weeks   Status On-going     SLP LONG TERM GOAL #2   Title  pt will demo [facility] tasks requiring memory compensations (meds, appointments, etc)   Status Achieved     SLP LONG TERM GOAL #3   Title pt will demo  functional reading comprehension in 3-4 sentence paragraphs   Status Achieved     SLP LONG TERM GOAL #4   Title Patient will generate grammatical and meaningful sentences to complete moderate cognitive-linguistic task with 80% accruacy.   Time 7   Status Partially Met     SLP LONG TERM GOAL #5   Title Patient will complete high level word finding activities with 80% accuracy.   Time 7   Period Weeks   Status Partially Met          Plan - 04/19/17 1357    Clinical Impression Statement The clinician saw improved clarity in the patient's conversation abilities and increased coherence in describing the pictures in detail. He is still struggling with word retrieval but is able to construct meaningful phrases. In the upcoming session, the clinician will provide the patient with more  strategies for doing more complex arithmetic. The clinician will continue to have the patient work on word problems and encourage him to verbalize his thought process while he does the problems.    Speech Therapy Frequency 2x / week   Duration Other (comment)   Potential to Achieve Goals Good   Potential Considerations Ability to learn/carryover information;Cooperation/participation level;Medical prognosis;Previous level of function;Family/community support   SLP Home Exercise Plan The patient will complete a worksheet with more complex multiplication arithmetic problems   Consulted and Agree with Plan of Care Patient      Patient will benefit from skilled therapeutic intervention in order to improve the following deficits and impairments:   Aphasia    Problem List Patient Active Problem List   Diagnosis Date Noted  . Stenosis of right carotid artery 02/01/2017  . Acute embolic stroke ([place]) 73/66/8159  . Bradycardia 11/10/2015  . Essential tremor 06/03/2015  . Dizziness 08/21/2012  . Syncope 04/10/2011  . CALF PAIN, LEFT 10/11/2010  . DYSPEPSIA 07/31/2010  . CHEST DISCOMFORT 07/31/2010  . Hyperlipidemia 05/16/2010  . HYPERTENSION, BENIGN 05/16/2010  . CAD, NATIVE VESSEL 05/16/2010    Tillman Abide 04/19/2017, 2:01 PM  [place] MAIN [facility] SERVICES [address] [place], [place], [address] Phone: [phone]   Fax:  [phone]   Name: Wheeler [facility]ia [facility]. MRN: [id number] Date of Birth: July 12, 1933

## 2017-04-22 ENCOUNTER — Ambulatory Visit: Payer: Medicare Other | Admitting: Speech Pathology

## 2017-04-22 ENCOUNTER — Ambulatory Visit: Payer: Medicare Other | Admitting: Physical Therapy

## 2017-04-22 ENCOUNTER — Encounter: Payer: Self-pay | Admitting: Speech Pathology

## 2017-04-22 DIAGNOSIS — H5347 Heteronymous bilateral field defects: Secondary | ICD-10-CM | POA: Diagnosis not present

## 2017-04-22 DIAGNOSIS — R4701 Aphasia: Secondary | ICD-10-CM

## 2017-04-22 NOTE — Therapy (Signed)
[place] MAIN [facility] SERVICES [address] [place], [place], [address] Phone: [phone]   Fax:  [phone]  Speech Language Pathology Treatment  Patient Details  Name: [name]. MRN: [id number] Date of Birth: 1933/01/16 Referring Provider: Launa Grill, MD  Encounter Date: 04/22/2017      End of Session - 04/22/17 1436    Visit Number 21   Number of Visits 33   Date for SLP Re-Evaluation 05/27/17   SLP Start Time 104   SLP Stop Time  1145   SLP Time Calculation (min) 50 min   Activity Tolerance Patient tolerated treatment well      Past Medical History:  Diagnosis Date  . BPH (benign prostatic hyperplasia)   . Coronary artery disease   . Hypertension     Past Surgical History:  Procedure Laterality Date  . Intestinal Blockage    . SKIN CANCER EXCISION     x2  . TONSILLECTOMY      There were no vitals filed for this visit.      Subjective Assessment - 04/22/17 1427    Subjective The patient was alert and worked hard throughout the session. He was frustrated with the math problems at times and showed infrequent verbal paraphasias when reading numbers out loud in the math problems. The patient remarked that we had found another "soft spot" (i.e. area of weakness) today when the clinician pointed out that he had read a number incorrectly.   Currently in Pain? No/denies               ADULT SLP TREATMENT - 04/22/17 1424      General Information   Behavior/Cognition Alert;Cooperative;Pleasant mood     Treatment Provided   Treatment provided Cognitive-Linquistic     Pain Assessment   Pain Assessment No/denies pain     Cognitive-Linquistic Treatment   Treatment focused on Aphasia   Skilled Treatment The patient had four verbal paraphasias while reading the math word problems out loud (reading "six" instead of the actual number "twelve"). He was able to successfully complete 8 of 14 word problems  without assistance from the clinician. The clinician elicited language and communication opportunities by asking the patient to explain how he was coming to the correct answer. The patient also worked on 7 complex subtraction problems, but required skilled cueing from the clinician in order to complete all of them. The clinician provided detailed oral and written strategies to complete the subtraction problems.     Assessment / Recommendations / Plan   Plan Continue with current plan of care     Progression Toward Goals   Progression toward goals Progressing toward goals          SLP Education - 04/22/17 1428    Education provided Yes   Education Details Strategies for solving complex subtraction problems   Person(s) Educated Patient   Methods Explanation;Demonstration;Handout   Comprehension Verbal cues required;Need further instruction          SLP Short Term Goals - 04/01/17 1015      SLP SHORT TERM GOAL #1   Baseline 04/01/17 - avg 8          SLP Long Term Goals - 04/01/17 1233      SLP LONG TERM GOAL #1   Title pt will use compensations for functional 10 minute mod-max complex conversation   Time 5   Period Weeks   Status On-going     SLP LONG TERM  GOAL #2   Title  pt will demo [facility] tasks requiring memory compensations (meds, appointments, etc)   Status Achieved     SLP LONG TERM GOAL #3   Title pt will demo functional reading comprehension in 3-4 sentence paragraphs   Status Achieved     SLP LONG TERM GOAL #4   Title Patient will generate grammatical and meaningful sentences to complete moderate cognitive-linguistic task with 80% accruacy.   Time 7   Status Partially Met     SLP LONG TERM GOAL #5   Title Patient will complete high level word finding activities with 80% accuracy.   Time 7   Period Weeks   Status Partially Met          Plan - 04/22/17 1439    Clinical Impression Statement The patient showed an improvement in the time it took him to  complete the word problems during the session and was able to explain his thought process better with each problem. The patient had a hard time with the complex subtraction problems. He showed an understanding of certain steps in solving the problem, but was not able to put them all together to form the cohesive process. In future sessions, the clinician will continue to work on strategies for complex arithmetic problems while requiring verbal explanations from the patient about the work he is doing.   Speech Therapy Frequency 2x / week   Duration Other (comment)   Treatment/Interventions Language facilitation;Cueing hierarchy;SLP instruction and feedback;Cognitive reorganization   Potential to Achieve Goals Good   Potential Considerations Ability to learn/carryover information;Cooperation/participation level;Previous level of function;Severity of impairments   SLP Home Exercise Plan The patient will complete a worksheet of complex subtraction problems.   Consulted and Agree with Plan of Care Patient      Patient will benefit from skilled therapeutic intervention in order to improve the following deficits and impairments:   No diagnosis found.    Problem List Patient Active Problem List   Diagnosis Date Noted  . Stenosis of right carotid artery 02/01/2017  . Acute embolic stroke ([place]) 41/36/4383  . Bradycardia 11/10/2015  . Essential tremor 06/03/2015  . Dizziness 08/21/2012  . Syncope 04/10/2011  . CALF PAIN, LEFT 10/11/2010  . DYSPEPSIA 07/31/2010  . CHEST DISCOMFORT 07/31/2010  . Hyperlipidemia 05/16/2010  . HYPERTENSION, BENIGN 05/16/2010  . CAD, NATIVE VESSEL 05/16/2010    Aline August 04/22/2017, 2:41 PM  [place] MAIN [facility] SERVICES [address] [place], [place], [address] Phone: [phone]   Fax:  [phone]   Name: [name] [facility]. MRN: [id number] Date of Birth: 06-Nov-1933

## 2017-04-24 ENCOUNTER — Ambulatory Visit: Payer: Medicare Other | Admitting: Occupational Therapy

## 2017-04-24 ENCOUNTER — Ambulatory Visit: Payer: Medicare Other | Admitting: Physical Therapy

## 2017-04-24 ENCOUNTER — Encounter: Payer: Self-pay | Admitting: Speech Pathology

## 2017-04-24 ENCOUNTER — Ambulatory Visit: Payer: Medicare Other | Admitting: Speech Pathology

## 2017-04-24 DIAGNOSIS — R4701 Aphasia: Secondary | ICD-10-CM

## 2017-04-24 DIAGNOSIS — H5347 Heteronymous bilateral field defects: Secondary | ICD-10-CM | POA: Diagnosis not present

## 2017-04-24 NOTE — Therapy (Signed)
[place] MAIN [facility] SERVICES [address] [place], [place], [address] Phone: [phone]   Fax:  [phone]  Occupational Therapy Treatment  Patient Details  Name: [name]. MRN: [id number] Date of Birth: 1933/01/27 Referring Provider: Dr. Rea College  Encounter Date: 04/24/2017      OT End of Session - 04/24/17 0926    Visit Number 18   Number of Visits 32   Date for OT Re-Evaluation 05/14/17   Authorization Type medicare G-code 7   OT Start Time [phone]   OT Stop Time 1000   OT Time Calculation (min) 42 min   Activity Tolerance Patient tolerated treatment well   Behavior During Therapy [facility] for tasks assessed/performed      Past Medical History:  Diagnosis Date  . BPH (benign prostatic hyperplasia)   . Coronary artery disease   . Hypertension     Past Surgical History:  Procedure Laterality Date  . Intestinal Blockage    . SKIN CANCER EXCISION     x2  . TONSILLECTOMY      There were no vitals filed for this visit.      Subjective Assessment - 04/24/17 0923    Subjective  Pt. reports he passed his driving test.   Patient is accompained by: Family member   Pertinent History see epic.  L CVA, R hemiopsia, Parkinson's, essential tremor   Patient Stated Goals I want to get well and be perfect again.  I hate these questions (pt unable to give specific answer)      OT TREATMENT   Selfcare:  Pt. worked on visual scanning, and visual search patterns in near space for tabletop ADL tasks. Pt. worked on left to right visual scanning tasks copying telephone numbers. Pt. worked on [employer] using horizontal rectilinear visual search strategies, and search patterns.  Pt. worked on Radio broadcast assistant, however had difficulty. Pt. Completed the maze by working in reverse to complete the maze                             OT Education - 04/24/17 [phone]    Education provided Yes   Education Details Visual scanning   Person(s) Educated Patient   Methods Explanation;Demonstration   Comprehension Verbalized understanding;Need further instruction          OT Short Term Goals - 02/14/17 1457      OT SHORT TERM GOAL #1   Title Pt and wife will be mod I with HEP - 02/21/2017   Time 4   Period Weeks   Status On-going  needs reinforcement of coordination HEP     OT SHORT TERM GOAL #2   Title Pt will be mod I with shower transfers   Status Achieved  met per pt report     OT SHORT TERM GOAL #3   Title Pt will be mod I with bathing   Status Achieved     OT SHORT TERM GOAL #4   Title Pt will complete table top scanning activity (not lanaguage or letter based) with 75% accuracy   Status Achieved  met for copying small peg design with min v.c.     OT SHORT TERM GOAL #5   Title Pt will attend to familiar task in busy environment with no more than min vc's.     Status Achieved           OT Long Term Goals -  03/19/17 1114      OT LONG TERM GOAL #1   Title Pt and wife will be mod I with ugraded HEP prn - 03/21/2017   Time 8   Period Weeks   Status On-going     OT LONG TERM GOAL #2   Title Pt will demonstrate improved coordinaton by decreasing time on 9 hole peg test by 4 seconds (baseline=34.15)   Time 8   Period Weeks   Status Achieved     OT LONG TERM GOAL #3   Title Pt will be able to print/write name legibly AE prn- (revised goal) Pt will demonstrate ability to write a 4 sentence paragraph with 100% legibility, and no significant decr. in letter size using AE PRN.   Time 8   Period Weeks   Status Achieved     OT LONG TERM GOAL #4   Title Pt will demonstate ability to maintain balance on outdoor uneven surfaces in preparation for returning to simple yard work.    Time 8   Period Weeks   Status Deferred     OT LONG TERM GOAL #5   Title Pt will require no more than 2 vc's to recall information from therapy session   Time 8   Period Weeks    Status On-going     OT LONG TERM GOAL #6   Title Pt will complete table top scanning activity (not language based) with 90% accuracy   Time 8   Period Weeks   Status On-going     OT LONG TERM GOAL #7   Title Pt will complete environmental scanning task with at least 75% accuracy.  Revised to 90% accuracy 02/14/17   Time 8   Period Weeks   Status On-going     OT LONG TERM GOAL #8   Title Pt will perform simple divided attention with environmental scanning for improved safety for ambulation/in prep for driving with at least 75% accuracy.   Baseline Pt. will demonstrate visual compensatory strategies 100% of the time during ADLs, and IADLs.    Time 8   Period Weeks   Status New               Plan - 04/24/17 8675    Clinical Impression Statement Pt. reports that his eye doctor said his vision is improving, and has cleared him to resume driving. Pt. reports he passed the test at the [facility]. Pt. has resumed driving, and drove himself to therapy today. Pt. continues to work  on improving visual scanning, and visual search patterns for near space at tabletop tasks for reading, and writing.   Rehab Potential Good   Clinical Impairments Affecting Rehab Potential decr awareness/   OT Frequency 2x / week   OT Duration 8 weeks   OT Treatment/Interventions Self-care/ADL training;Electrical Stimulation;Therapeutic exercise;Neuromuscular education;DME and/or AE instruction;Energy conservation;Therapist, nutritional;Therapeutic activities;Cognitive remediation/compensation;Visual/perceptual remediation/compensation;Patient/family education;Balance training   Consulted and Agree with Plan of Care Patient      Patient will benefit from skilled therapeutic intervention in order to improve the following deficits and impairments:  Decreased balance, Decreased cognition, Decreased coordination, Decreased safety awareness, Decreased mobility, Decreased knowledge of use of DME, Impaired UE  functional use, Impaired vision/preception  Visit Diagnosis: Hemianopsia    Problem List Patient Active Problem List   Diagnosis Date Noted  . Stenosis of right carotid artery 02/01/2017  . Acute embolic stroke ([place]) 44/92/0100  . Bradycardia 11/10/2015  . Essential tremor 06/03/2015  . Dizziness 08/21/2012  .  Syncope 04/10/2011  . CALF PAIN, LEFT 10/11/2010  . DYSPEPSIA 07/31/2010  . CHEST DISCOMFORT 07/31/2010  . Hyperlipidemia 05/16/2010  . HYPERTENSION, BENIGN 05/16/2010  . CAD, NATIVE VESSEL 05/16/2010    Harrel Carina 04/24/2017, 9:55 AM  [place] MAIN [facility] SERVICES [address] [place], [place], [address] Phone: [phone]   Fax:  [phone]  Name: Calvin Dion [facility]. MRN: [id number] Date of Birth: [birth year]

## 2017-04-24 NOTE — Therapy (Signed)
[place] MAIN [facility] SERVICES [address] [place], [place], [address] Phone: [phone]   Fax:  [phone]  Speech Language Pathology Treatment  Patient Details  Name: [name]. MRN: [id number] Date of Birth: 04/24/1933 Referring Provider: Launa Grill, MD  Encounter Date: 04/24/2017      End of Session - 04/24/17 1149    Visit Number 22   Number of Visits 33   Date for SLP Re-Evaluation 05/27/17   SLP Start Time 1000   SLP Stop Time  1100   SLP Time Calculation (min) 60 min   Activity Tolerance Patient tolerated treatment well      Past Medical History:  Diagnosis Date  . BPH (benign prostatic hyperplasia)   . Coronary artery disease   . Hypertension     Past Surgical History:  Procedure Laterality Date  . Intestinal Blockage    . SKIN CANCER EXCISION     x2  . TONSILLECTOMY      There were no vitals filed for this visit.      Subjective Assessment - 04/24/17 1147    Subjective The patient worked hard throughout the session. He became frustrated with himself a few times due to his difficulty with math problems that he thought should have been easy for him. While trying to describe the process he used to solve a math problem, he used the word "whatever" in place of the appropriate word. He then remarked, "'whatever' has become a very useful word for me nowadays," indicating that he is still having some word retrieval issues.   Currently in Pain? No/denies               ADULT SLP TREATMENT - 04/24/17 0001      General Information   Behavior/Cognition Alert;Cooperative;Pleasant mood     Treatment Provided   Treatment provided Cognitive-Linquistic     Pain Assessment   Pain Assessment No/denies pain     Cognitive-Linquistic Treatment   Treatment focused on Aphasia   Skilled Treatment The clinician asked the patient to tell a few stories about himself at the beginning of the session. He used  adequate content words and the clinician was able to follow the stories with no extra effort. The patient then worked on 15 math word problems, which the clinician had him read aloud and then talk through the process of solving them in order to elicit language from him. The patient made 6 reading errors by substituting incorrect words for the target words (such as "feet" for "yards"). The patient gave incorrect answers to 33% of the problems, which he was able to solve after skilled cueing and modeling from the clinician. The patient also required occasional reminders to verbalize the process of solving the problems. The patient attempted some complex subtraction problems at the end of the session, but we ran out of time. The clinician provided a model of how to do the problems as well as a set of written instructions for the patient to take home.     Assessment / Recommendations / Plan   Plan Continue with current plan of care     Progression Toward Goals   Progression toward goals Progressing toward goals          SLP Education - 04/24/17 1147    Education provided Yes   Education Details Step by step written instructions for completing subtraction problems (visual model)   Person(s) Educated Patient   Methods Explanation;Demonstration;Handout   Comprehension  Verbalized understanding;Need further instruction          SLP Short Term Goals - 04/01/17 1015      SLP SHORT TERM GOAL #1   Baseline 04/01/17 - avg 8          SLP Long Term Goals - 04/01/17 1233      SLP LONG TERM GOAL #1   Title pt will use compensations for functional 10 minute mod-max complex conversation   Time 5   Period Weeks   Status On-going     SLP LONG TERM GOAL #2   Title  pt will demo [facility] tasks requiring memory compensations (meds, appointments, etc)   Status Achieved     SLP LONG TERM GOAL #3   Title pt will demo functional reading comprehension in 3-4 sentence paragraphs   Status Achieved     SLP  LONG TERM GOAL #4   Title Patient will generate grammatical and meaningful sentences to complete moderate cognitive-linguistic task with 80% accruacy.   Time 7   Status Partially Met     SLP LONG TERM GOAL #5   Title Patient will complete high level word finding activities with 80% accuracy.   Time 7   Period Weeks   Status Partially Met          Plan - 04/24/17 1150    Clinical Impression Statement The patient is progressing in his conversational speech abilities by using more content words and providing appropriate context to the topics he is talking about. Though he still needs occasional prompting, the patient is able to talk through his thought process when solving math problems, though it still often takes him longer than would be normal to explain his techniques. The clinician will continue to ask him to verbalize his thought processes for solving math problems and model techniques for him to be able to complete complex subtraction problems, while assessing his conversational speech abilities in the upcoming session.   Speech Therapy Frequency 2x / week   Duration Other (comment)   Treatment/Interventions Language facilitation;Cueing hierarchy;SLP instruction and feedback;Cognitive reorganization   Potential to Achieve Goals Good   Potential Considerations Ability to learn/carryover information;Cooperation/participation level;Previous level of function;Severity of impairments   SLP Home Exercise Plan The patient will complete a worksheet of complex subtraction problems with the aide of written instructions.   Consulted and Agree with Plan of Care Patient      Patient will benefit from skilled therapeutic intervention in order to improve the following deficits and impairments:   Aphasia    Problem List Patient Active Problem List   Diagnosis Date Noted  . Stenosis of right carotid artery 02/01/2017  . Acute embolic stroke ([place]) 64/15/8309  . Bradycardia 11/10/2015  .  Essential tremor 06/03/2015  . Dizziness 08/21/2012  . Syncope 04/10/2011  . CALF PAIN, LEFT 10/11/2010  . DYSPEPSIA 07/31/2010  . CHEST DISCOMFORT 07/31/2010  . Hyperlipidemia 05/16/2010  . HYPERTENSION, BENIGN 05/16/2010  . CAD, NATIVE VESSEL 05/16/2010    Aline August 04/24/2017, 11:53 AM  [place] MAIN [facility] SERVICES [address] [place], [place], [address] Phone: [phone]   Fax:  [phone]   Name: Neely Cecena [facility]. MRN: [id number] Date of Birth: 12-Jan-1933

## 2017-04-30 ENCOUNTER — Encounter: Payer: Medicare Other | Admitting: Speech Pathology

## 2017-04-30 ENCOUNTER — Encounter: Payer: Medicare Other | Admitting: Occupational Therapy

## 2017-04-30 ENCOUNTER — Ambulatory Visit: Payer: Medicare Other | Admitting: Physical Therapy

## 2017-05-01 ENCOUNTER — Ambulatory Visit: Payer: Medicare Other | Admitting: Occupational Therapy

## 2017-05-02 ENCOUNTER — Encounter: Payer: Medicare Other | Admitting: Speech Pathology

## 2017-05-02 ENCOUNTER — Encounter: Payer: Medicare Other | Admitting: Occupational Therapy

## 2017-05-02 ENCOUNTER — Ambulatory Visit: Payer: Medicare Other | Admitting: Physical Therapy

## 2017-05-06 ENCOUNTER — Ambulatory Visit: Payer: Medicare Other | Admitting: Speech Pathology

## 2017-05-06 ENCOUNTER — Encounter: Payer: Self-pay | Admitting: Speech Pathology

## 2017-05-06 ENCOUNTER — Ambulatory Visit: Payer: Medicare Other | Attending: Physical Medicine and Rehabilitation | Admitting: Occupational Therapy

## 2017-05-06 ENCOUNTER — Ambulatory Visit: Payer: Medicare Other

## 2017-05-06 DIAGNOSIS — H5347 Heteronymous bilateral field defects: Secondary | ICD-10-CM | POA: Diagnosis present

## 2017-05-06 DIAGNOSIS — M6281 Muscle weakness (generalized): Secondary | ICD-10-CM | POA: Diagnosis present

## 2017-05-06 DIAGNOSIS — R278 Other lack of coordination: Secondary | ICD-10-CM | POA: Diagnosis present

## 2017-05-06 DIAGNOSIS — R4701 Aphasia: Secondary | ICD-10-CM | POA: Insufficient documentation

## 2017-05-06 NOTE — Therapy (Signed)
[place] MAIN [facility] SERVICES [address] [place], [place], [address] Phone: [phone]   Fax:  [phone]  Speech Language Pathology Treatment  Patient Details  Name: [name]. MRN: [id number] Date of Birth: 03/31/1933 Referring Provider: Launa Grill, MD  Encounter Date: 05/06/2017      End of Session - 05/06/17 1133    Visit Number 23   Number of Visits 33   Date for SLP Re-Evaluation 05/27/17   SLP Start Time 1000   SLP Stop Time  1100   SLP Time Calculation (min) 60 min   Activity Tolerance Patient tolerated treatment well      Past Medical History:  Diagnosis Date  . BPH (benign prostatic hyperplasia)   . Coronary artery disease   . Hypertension     Past Surgical History:  Procedure Laterality Date  . Intestinal Blockage    . SKIN CANCER EXCISION     x2  . TONSILLECTOMY      There were no vitals filed for this visit.      Subjective Assessment - 05/06/17 1131    Subjective The patient was alert and worked hard throughout the session with a positive attitude. He said that the written instructions the clinician provided him with his homework from the previous session were helpful.   Currently in Pain? No/denies               ADULT SLP TREATMENT - 05/06/17 0001      General Information   Behavior/Cognition Alert;Cooperative;Pleasant mood     Treatment Provided   Treatment provided Cognitive-Linquistic     Pain Assessment   Pain Assessment No/denies pain     Cognitive-Linquistic Treatment   Treatment focused on Aphasia   Skilled Treatment The patient was able to correctly generate synonyms, antonyms, and simple sentences for a list of words with 70% accuracy. He completed a worksheet about homonyms with 75% accuracy and connected halves of compound words with 85% accuracy. The patient was able to successfully complete word problems without assistance from the clinician with 100%  accuracy. The clinician elicited language and communication opportunities by asking the patient to explain how he was coming to the correct answer. The patient required skilled cueing and scaffolding from the clinician to adequately explain the process he was using to solve the word problems.      Assessment / Recommendations / Plan   Plan Continue with current plan of care     Progression Toward Goals   Progression toward goals Progressing toward goals          SLP Education - 05/06/17 1131    Education provided Yes   Education Details Circumlocution and gesturing for word-finding problems   Person(s) Educated Patient   Methods Explanation;Demonstration   Comprehension Verbalized understanding          SLP Short Term Goals - 04/01/17 1015      SLP SHORT TERM GOAL #1   Baseline 04/01/17 - avg 8          SLP Long Term Goals - 04/01/17 1233      SLP LONG TERM GOAL #1   Title pt will use compensations for functional 10 minute mod-max complex conversation   Time 5   Period Weeks   Status On-going     SLP LONG TERM GOAL #2   Title  pt will demo [facility] tasks requiring memory compensations (meds, appointments, etc)   Status Achieved  SLP LONG TERM GOAL #3   Title pt will demo functional reading comprehension in 3-4 sentence paragraphs   Status Achieved     SLP LONG TERM GOAL #4   Title Patient will generate grammatical and meaningful sentences to complete moderate cognitive-linguistic task with 80% accruacy.   Time 7   Status Partially Met     SLP LONG TERM GOAL #5   Title Patient will complete high level word finding activities with 80% accuracy.   Time 7   Period Weeks   Status Partially Met          Plan - 05/06/17 1133    Clinical Impression Statement The patient showed an improvement in the time it took him to complete the word problems during the session and was able to explain his thought process better when given skilled cues from the clinician. In the  synonyms and antonyms activity, he made use of gesturing and circumlocution, which the clinician encouraged him to continue doing. In future sessions, the clinician will continue to elicit language opportunities and provide written instructions for activities or problems that the client finds particularly difficult.   Speech Therapy Frequency 2x / week   Duration Other (comment)   Treatment/Interventions Language facilitation;Cueing hierarchy;SLP instruction and feedback;Cognitive reorganization   Potential to Achieve Goals Good   Potential Considerations Ability to learn/carryover information;Cooperation/participation level;Previous level of function;Severity of impairments   SLP Home Exercise Plan Continue with current plan of care   Consulted and Agree with Plan of Care Patient      Patient will benefit from skilled therapeutic intervention in order to improve the following deficits and impairments:   Aphasia    Problem List Patient Active Problem List   Diagnosis Date Noted  . Stenosis of right carotid artery 02/01/2017  . Acute embolic stroke ([place]) 88/91/6945  . Bradycardia 11/10/2015  . Essential tremor 06/03/2015  . Dizziness 08/21/2012  . Syncope 04/10/2011  . CALF PAIN, LEFT 10/11/2010  . DYSPEPSIA 07/31/2010  . CHEST DISCOMFORT 07/31/2010  . Hyperlipidemia 05/16/2010  . HYPERTENSION, BENIGN 05/16/2010  . CAD, NATIVE VESSEL 05/16/2010    Aline August 05/06/2017, 11:36 AM  [place] MAIN [facility] SERVICES [address] [place], [place], [address] Phone: [phone]   Fax:  [phone]   Name: [name] [facility]. MRN: [id number] Date of Birth: Oct 18, 1933

## 2017-05-06 NOTE — Therapy (Addendum)
[place] MAIN [facility] SERVICES [address] [place], [place], [address] Phone: [phone]   Fax:  [phone]  Occupational Therapy Treatment  Patient Details  Name: [name]. MRN: [id number] Date of Birth: August 19, 1933 Referring Provider: Dr. Rea College  Encounter Date: 05/06/2017      OT End of Session - 05/06/17 0930    Visit Number 19   Number of Visits 32   Date for OT Re-Evaluation 05/14/17   Authorization Type medicare G-code 8   OT Start Time 0915   OT Stop Time 1000   OT Time Calculation (min) 45 min   Activity Tolerance Patient tolerated treatment well   Behavior During Therapy [facility] for tasks assessed/performed      Past Medical History:  Diagnosis Date  . BPH (benign prostatic hyperplasia)   . Coronary artery disease   . Hypertension     Past Surgical History:  Procedure Laterality Date  . Intestinal Blockage    . SKIN CANCER EXCISION     x2  . TONSILLECTOMY      There were no vitals filed for this visit.      Subjective Assessment - 05/06/17 0929    Subjective  Pt. returned from travelling to [place] this past week.   Patient is accompained by: Family member   Patient Stated Goals I want to get well and be perfect again.  I hate these questions (pt unable to give specific answer)   Currently in Pain? No/denies       OT TREATMENT    Selfcare:  Pt. worked on visual scanning, and visual compensatory strategies for tabletop tasks in preparation for reading, and writing tasks.  Pt. required verbal cues to check his work, as he missed several double numbers with 658M format. Pt. worked on word searches in 1.58M format. Pt. Utilized horizontal rectilinear search strategies. Pt. Requires increased time to complete.                          OT Education - 05/06/17 0930    Education provided Yes   Education Details Visual scanning, and visual compensatory strategies for ADLs.    Person(s) Educated Patient   Methods Explanation;Demonstration;Handout   Comprehension Verbalized understanding;Need further instruction          OT Short Term Goals - 02/14/17 1457      OT SHORT TERM GOAL #1   Title Pt and wife will be mod I with HEP - 02/21/2017   Time 4   Period Weeks   Status On-going  needs reinforcement of coordination HEP     OT SHORT TERM GOAL #2   Title Pt will be mod I with shower transfers   Status Achieved  met per pt report     OT SHORT TERM GOAL #3   Title Pt will be mod I with bathing   Status Achieved     OT SHORT TERM GOAL #4   Title Pt will complete table top scanning activity (not lanaguage or letter based) with 75% accuracy   Status Achieved  met for copying small peg design with min v.c.     OT SHORT TERM GOAL #5   Title Pt will attend to familiar task in busy environment with no more than min vc's.     Status Achieved           OT Long Term Goals - 03/19/17 1114  OT LONG TERM GOAL #1   Title Pt and wife will be mod I with ugraded HEP prn - 03/21/2017   Time 8   Period Weeks   Status On-going     OT LONG TERM GOAL #2   Title Pt will demonstrate improved coordinaton by decreasing time on 9 hole peg test by 4 seconds (baseline=34.15)   Time 8   Period Weeks   Status Achieved     OT LONG TERM GOAL #3   Title Pt will be able to print/write name legibly AE prn- (revised goal) Pt will demonstrate ability to write a 4 sentence paragraph with 100% legibility, and no significant decr. in letter size using AE PRN.   Time 8   Period Weeks   Status Achieved     OT LONG TERM GOAL #4   Title Pt will demonstate ability to maintain balance on outdoor uneven surfaces in preparation for returning to simple yard work.    Time 8   Period Weeks   Status Deferred     OT LONG TERM GOAL #5   Title Pt will require no more than 2 vc's to recall information from therapy session   Time 8   Period Weeks   Status On-going     OT LONG  TERM GOAL #6   Title Pt will complete table top scanning activity (not language based) with 90% accuracy   Time 8   Period Weeks   Status On-going     OT LONG TERM GOAL #7   Title Pt will complete environmental scanning task with at least 75% accuracy.  Revised to 90% accuracy 02/14/17   Time 8   Period Weeks   Status On-going     OT LONG TERM GOAL #8   Title Pt will perform simple divided attention with environmental scanning for improved safety for ambulation/in prep for driving with at least 75% accuracy.   Baseline Pt. will demonstrate visual compensatory strategies 100% of the time during ADLs, and IADLs.    Time 8   Period Weeks   Status New               Plan - 05/06/17 0931    Clinical Impression Statement Pt. reports driving is going well, and he drove himself to therapy today. Pt. continues to present with limited vision on the right. Pt. continues to work on improving  right sided awareness, visual scanning, and visual compensatory strategies for ADLs, and IADLs.    Occupational performance deficits (Please refer to evaluation for details): ADL's   Rehab Potential Good   OT Frequency 2x / week   OT Duration 8 weeks   OT Treatment/Interventions Self-care/ADL training;Electrical Stimulation;Therapeutic exercise;Neuromuscular education;DME and/or AE instruction;Energy conservation;Therapist, nutritional;Therapeutic activities;Cognitive remediation/compensation;Visual/perceptual remediation/compensation;Patient/family education;Balance training   Consulted and Agree with Plan of Care Patient      Patient will benefit from skilled therapeutic intervention in order to improve the following deficits and impairments:  Decreased balance, Decreased cognition, Decreased coordination, Decreased safety awareness, Decreased mobility, Decreased knowledge of use of DME, Impaired UE functional use, Impaired vision/preception  Visit Diagnosis: Muscle weakness  (generalized)  Other lack of coordination    Problem List Patient Active Problem List   Diagnosis Date Noted  . Stenosis of right carotid artery 02/01/2017  . Acute embolic stroke ([place]) 41/58/3094  . Bradycardia 11/10/2015  . Essential tremor 06/03/2015  . Dizziness 08/21/2012  . Syncope 04/10/2011  . CALF PAIN, LEFT 10/11/2010  . DYSPEPSIA 07/31/2010  .  CHEST DISCOMFORT 07/31/2010  . Hyperlipidemia 05/16/2010  . HYPERTENSION, BENIGN 05/16/2010  . CAD, NATIVE VESSEL 05/16/2010    Harrel Carina, MS, OTR/L 05/06/2017, 9:43 AM  [place] MAIN [facility] SERVICES [address] [place], [place], [address] Phone: [phone]   Fax:  [phone]  Name: [name] [facility]. MRN: [id number] Date of Birth: [birth year]

## 2017-05-08 ENCOUNTER — Ambulatory Visit: Payer: Medicare Other | Admitting: Occupational Therapy

## 2017-05-08 ENCOUNTER — Ambulatory Visit: Payer: Medicare Other | Admitting: Speech Pathology

## 2017-05-08 ENCOUNTER — Ambulatory Visit: Payer: Medicare Other

## 2017-05-08 ENCOUNTER — Encounter: Payer: Self-pay | Admitting: Speech Pathology

## 2017-05-08 DIAGNOSIS — H5347 Heteronymous bilateral field defects: Secondary | ICD-10-CM

## 2017-05-08 DIAGNOSIS — M6281 Muscle weakness (generalized): Secondary | ICD-10-CM | POA: Diagnosis not present

## 2017-05-08 DIAGNOSIS — R4701 Aphasia: Secondary | ICD-10-CM

## 2017-05-08 NOTE — Therapy (Signed)
[place] MAIN [facility] SERVICES [address] [place], [place], [address] Phone: [phone]   Fax:  [phone]  Speech Language Pathology Treatment  Patient Details  Name: [name]. MRN: [id number] Date of Birth: 1932/12/13 Referring Provider: Launa Grill, MD  Encounter Date: 05/08/2017      End of Session - 05/08/17 1650    Visit Number 24   Number of Visits 33   Date for SLP Re-Evaluation 05/27/17   SLP Start Time 1000   SLP Stop Time  1100   SLP Time Calculation (min) 60 min   Activity Tolerance Patient tolerated treatment well      Past Medical History:  Diagnosis Date  . BPH (benign prostatic hyperplasia)   . Coronary artery disease   . Hypertension     Past Surgical History:  Procedure Laterality Date  . Intestinal Blockage    . SKIN CANCER EXCISION     x2  . TONSILLECTOMY      There were no vitals filed for this visit.      Subjective Assessment - 05/08/17 1524    Subjective Patient exhibited frustration when attempting math problems that used to be easy for him, but worked hard throughout the session.   Currently in Pain? No/denies               ADULT SLP TREATMENT - 05/08/17 0001      General Information   Behavior/Cognition Alert;Cooperative;Pleasant mood     Treatment Provided   Treatment provided Cognitive-Linquistic     Pain Assessment   Pain Assessment No/denies pain     Cognitive-Linquistic Treatment   Treatment focused on Aphasia   Skilled Treatment The patient completed math word problems with decreased levels of cueing from the clinician. He required skilled cueing for 3 of the 11 problems, and was able to verbalize his thought process once he had the solution. The patient made use of written instructions for complex subtraction problems and was able to complete them with 100% accuracy when he used the instructions. The clinician requested that the patient describe in  detail how to make scrambled eggs as if he was talking to someone who had never made them before. The patient required minimal scaffolding and cueing in order to give adequate instructions and provide enough content.     Assessment / Recommendations / Plan   Plan Continue with current plan of care     Progression Toward Goals   Progression toward goals Progressing toward goals          SLP Education - 05/08/17 1649    Education provided Yes   Education Details Using written instructions   Person(s) Educated Patient   Methods Explanation;Demonstration   Comprehension Verbalized understanding          SLP Short Term Goals - 04/01/17 1015      SLP SHORT TERM GOAL #1   Baseline 04/01/17 - avg 8          SLP Long Term Goals - 04/01/17 1233      SLP LONG TERM GOAL #1   Title pt will use compensations for functional 10 minute mod-max complex conversation   Time 5   Period Weeks   Status On-going     SLP LONG TERM GOAL #2   Title  pt will demo [facility] tasks requiring memory compensations (meds, appointments, etc)   Status Achieved     SLP LONG TERM GOAL #3   Title pt will  demo functional reading comprehension in 3-4 sentence paragraphs   Status Achieved     SLP LONG TERM GOAL #4   Title Patient will generate grammatical and meaningful sentences to complete moderate cognitive-linguistic task with 80% accruacy.   Time 7   Status Partially Met     SLP LONG TERM GOAL #5   Title Patient will complete high level word finding activities with 80% accuracy.   Time 7   Period Weeks   Status Partially Met          Plan - 05/08/17 1650    Clinical Impression Statement The patient is progressing in his ability to effectively verbalize his thought process for the word problems. His ability to follow written instructions helped him significantly improve his accuracy with the complex subtraction problems with minimal cueing from the clinician. The patient is also using more content  words when describing activities and in general conversation.   Speech Therapy Frequency 2x / week   Duration Other (comment)   Treatment/Interventions Language facilitation;Cueing hierarchy;SLP instruction and feedback;Cognitive reorganization   Potential to Achieve Goals Good   Potential Considerations Ability to learn/carryover information;Cooperation/participation level;Previous level of function;Severity of impairments   SLP Home Exercise Plan Use written instructions for activities at home   Consulted and Agree with Plan of Care Patient      Patient will benefit from skilled therapeutic intervention in order to improve the following deficits and impairments:   Aphasia    Problem List Patient Active Problem List   Diagnosis Date Noted  . Stenosis of right carotid artery 02/01/2017  . Acute embolic stroke ([place]) 42/37/0230  . Bradycardia 11/10/2015  . Essential tremor 06/03/2015  . Dizziness 08/21/2012  . Syncope 04/10/2011  . CALF PAIN, LEFT 10/11/2010  . DYSPEPSIA 07/31/2010  . CHEST DISCOMFORT 07/31/2010  . Hyperlipidemia 05/16/2010  . HYPERTENSION, BENIGN 05/16/2010  . CAD, NATIVE VESSEL 05/16/2010    Aline August 05/08/2017, 4:51 PM  [place] MAIN [facility] SERVICES [address] [place], [place], [address] Phone: [phone]   Fax:  [phone]   Name: Hoover Grewe [facility]. MRN: [id number] Date of Birth: 07-25-1933

## 2017-05-08 NOTE — Therapy (Signed)
[place] [facility] MA[facility] REHAB SERVICES [address] [place], [place], [address] Phone: [phone]   Fax:  [phone]  Occupational Therapy Treatment  Patient Details  Name: Calvin Stanford Hollomon Jr. MRN: [id number] Date of Birth: 10/17/1933 Referring Provider: Dr. Jason Downs  Encounter Date: 05/08/2017      OT End of Session - 05/08/17 0928    Visit Number 20   Number of Visits 32   Date for OT Re-Evaluation 05/14/17   Authorization Type medicare G-code 9   OT Start Time 0918   OT Stop Time 1000   OT Time Calculation (min) 42 min   Activity Tolerance Patient tolerated treatment well   Behavior During Therapy [facility] for tasks assessed/performed      Past Medical History:  Diagnosis Date  . BPH (benign prostatic hyperplasia)   . Coronary artery disease   . Hypertension     Past Surgical History:  Procedure Laterality Date  . Intestinal Blockage    . SKIN CANCER EXCISION     x2  . TONSILLECTOMY      There were no vitals filed for this visit.      Subjective Assessment - 05/08/17 0927    Subjective  Pt. reports he is making progress.   Patient is accompained by: Family member   Pertinent History see epic.  L CVA, R hemiopsia, Parkinson's, essential tremor   Patient Stated Goals I want to get well and be perfect again.  I hate these questions (pt unable to give specific answer)   Currently in Pain? No/denies      OT TREATMENT    Selfcare:  Pt. worked on visual scanning in near space for tabletop ADL tasks in preparation for reading, and writing. Pt. requires verbal cues for visual scanning, and visual search strategies. Pt. Utilized a horizontal rectilinear visual search strategy.                            OT Education - 05/08/17 0927    Education provided Yes   Education Details visual compensatory strategies   Person(s) Educated Patient   Methods Explanation;Demonstration   Comprehension Verbalized  understanding          OT Short Term Goals - 02/14/17 1457      OT SHORT TERM GOAL #1   Title Pt and wife will be mod I with HEP - 02/21/2017   Time 4   Period Weeks   Status On-going  needs reinforcement of coordination HEP     OT SHORT TERM GOAL #2   Title Pt will be mod I with shower transfers   Status Achieved  met per pt report     OT SHORT TERM GOAL #3   Title Pt will be mod I with bathing   Status Achieved     OT SHORT TERM GOAL #4   Title Pt will complete table top scanning activity (not lanaguage or letter based) with 75% accuracy   Status Achieved  met for copying small peg design with min v.c.     OT SHORT TERM GOAL #5   Title Pt will attend to familiar task in busy environment with no more than min vc's.     Status Achieved           OT Long Term Goals - 03/19/17 1114      OT LONG TERM GOAL #1   Title Pt and wife will be mod I   with ugraded HEP prn - 03/21/2017   Time 8   Period Weeks   Status On-going     OT LONG TERM GOAL #2   Title Pt will demonstrate improved coordinaton by decreasing time on 9 hole peg test by 4 seconds (baseline=34.15)   Time 8   Period Weeks   Status Achieved     OT LONG TERM GOAL #3   Title Pt will be able to print/write name legibly AE prn- (revised goal) Pt will demonstrate ability to write a 4 sentence paragraph with 100% legibility, and no significant decr. in letter size using AE PRN.   Time 8   Period Weeks   Status Achieved     OT LONG TERM GOAL #4   Title Pt will demonstate ability to maintain balance on outdoor uneven surfaces in preparation for returning to simple yard work.    Time 8   Period Weeks   Status Deferred     OT LONG TERM GOAL #5   Title Pt will require no more than 2 vc's to recall information from therapy session   Time 8   Period Weeks   Status On-going     OT LONG TERM GOAL #6   Title Pt will complete table top scanning activity (not language based) with 90% accuracy   Time 8   Period  Weeks   Status On-going     OT LONG TERM GOAL #7   Title Pt will complete environmental scanning task with at least 75% accuracy.  Revised to 90% accuracy 02/14/17   Time 8   Period Weeks   Status On-going     OT LONG TERM GOAL #8   Title Pt will perform simple divided attention with environmental scanning for improved safety for ambulation/in prep for driving with at least 75% accuracy.   Baseline Pt. will demonstrate visual compensatory strategies 100% of the time during ADLs, and IADLs.    Time 8   Period Weeks   Status New               Plan - 05/08/17 0034    Clinical Impression Statement Pt. reports he has been having difficulty with seeing to the right when reading. Pt. requires verbal cues to visual scan to the right, and for visual compensatory strategies. Pt. is making progress overall, however continues to  work on improving visual compensatory techniques, scanning, and visual search strategies for ADLs, ans IADLs.   Occupational performance deficits (Please refer to evaluation for details): ADL's   Rehab Potential Good   Current Impairments/barriers affecting progress: decr awareness/   OT Frequency 2x / week   OT Duration 8 weeks   OT Treatment/Interventions Self-care/ADL training;Electrical Stimulation;Therapeutic exercise;Neuromuscular education;DME and/or AE instruction;Energy conservation;Therapist, nutritional;Therapeutic activities;Cognitive remediation/compensation;Visual/perceptual remediation/compensation;Patient/family education;Balance training   Consulted and Agree with Plan of Care Patient      Patient will benefit from skilled therapeutic intervention in order to improve the following deficits and impairments:  Decreased balance, Decreased cognition, Decreased coordination, Decreased safety awareness, Decreased mobility, Decreased knowledge of use of DME, Impaired UE functional use, Impaired vision/preception  Visit  Diagnosis: Hemianopsia    Problem List Patient Active Problem List   Diagnosis Date Noted  . Stenosis of right carotid artery 02/01/2017  . Acute embolic stroke ([place]) 91/79/1505  . Bradycardia 11/10/2015  . Essential tremor 06/03/2015  . Dizziness 08/21/2012  . Syncope 04/10/2011  . CALF PAIN, LEFT 10/11/2010  . DYSPEPSIA 07/31/2010  . CHEST DISCOMFORT 07/31/2010  .  Hyperlipidemia 05/16/2010  . HYPERTENSION, BENIGN 05/16/2010  . CAD, NATIVE VESSEL 05/16/2010     , MS, OTR/L 05/08/2017, 9:38 AM  [place] [facility] MAIN REHAB SERVICES [address] [place], [place], [address] Phone: [phone]   Fax:  [phone]  Name: Illya Stanford Sipos Jr. MRN: [id number] Date of Birth: 02/13/1933 

## 2017-05-09 ENCOUNTER — Telehealth: Payer: Self-pay | Admitting: *Deleted

## 2017-05-09 ENCOUNTER — Encounter: Payer: Self-pay | Admitting: Internal Medicine

## 2017-05-09 ENCOUNTER — Ambulatory Visit (INDEPENDENT_AMBULATORY_CARE_PROVIDER_SITE_OTHER): Payer: Medicare Other | Admitting: Internal Medicine

## 2017-05-09 VITALS — BP 160/92 | HR 53 | Ht 66.0 in | Wt 190.2 lb

## 2017-05-09 DIAGNOSIS — I639 Cerebral infarction, unspecified: Secondary | ICD-10-CM

## 2017-05-09 NOTE — Telephone Encounter (Signed)
-----   Message from Melynda KellerBarbara J Decker sent at 05/09/2017  1:30 PM EDT ----- Regarding: Loop Recorder  Nehemiah MassedHey Heather,  I have scheduled this patient for a Loop Recorder on 05/14/17 at 8:30a Bedside for Dr Graciela HusbandsKlein per your request.  I tried to call you but you were probably at lunch and I am leaving for the afternoon.  Just wanted to confirm this was correct.  I will be at my dest at 8a in the morning if you need to call me back, [phone].  Thanks!

## 2017-05-09 NOTE — Telephone Encounter (Signed)
I called and spoke with Mrs. Orth. She is aware that the patient is scheduled for his loop recorder implant on 05/14/17 at 8:30 am - to [facility] am [facility].

## 2017-05-09 NOTE — Progress Notes (Signed)
ELECTROPHYSIOLOGY CONSULT NOTE  Patient ID: Calvin AmberGeorge Stanford Krusemark Jr., MRN: [id number], DOB/AGE: 08-14-1933 83 y.o. Admit date: (Not on file) Date of Consult: 05/09/2017  Primary Physician: Marguarite ArbourSparks, Jeffrey D, MD Primary Cardiologist: Anselmo PicklerG   Gilmore [name] is being seen today for the evaluation of implantable linq insertion and palpitations at the request of Dr Maris BergerGollan  HPI Calvin ShortsGeorge [name] is a 83 y.o. male  Seen for consideration of loop recorder following a stroke 2/18   He presented with aphasia and imaging revealed what was thought to be embolic stroke subsequently complicated by hemorrhagic transformation  A 14 day monitor demonstrated PVC correlating with "fluttering:"  Nonsustained atrial tach was also noted  [facility] reviewed- suggested implantation of LINQ for identification of atrial fib.  Echo ([facility]) normal LV function with LAE ( 41/-/-)  He has hx of essential tremor not responsive to meds thus far  He is on statin therapy-- with mod dose simva + recently added zetia.    Labs 4/18 normal [facility]  LDL 4/18 48          Past Medical History:  Diagnosis Date  . BPH (benign prostatic hyperplasia)   . Coronary artery disease   . Hypertension       Surgical History:  Past Surgical History:  Procedure Laterality Date  . Intestinal Blockage    . SKIN CANCER EXCISION     x2  . TONSILLECTOMY       Home Meds: Prior to Admission medications   Medication Sig Start Date End Date Taking? Authorizing Provider  aspirin 81 MG tablet Take 81 mg by mouth daily.     Yes [provider]  b complex vitamins tablet Take 1 tablet by mouth daily.   Yes [provider]  carbidopa-levodopa (PARCOPA) 25-100 MG disintegrating tablet Take 1 tablet by mouth 2 (two) times daily.   Yes [provider]  cholecalciferol (VITAMIN D) 1000 units tablet Take 2,000 Units by mouth daily.   Yes [provider]    ezetimibe (ZETIA) 10 MG tablet Take 1 tablet (10 mg total) by mouth daily. 02/01/17  Yes Gollan, Tollie Pizzaimothy J, MD  FLAXSEED, LINSEED, PO Take 2 g by mouth daily.   Yes [provider]  folic acid (FOLVITE) 400 MCG tablet Take 400 mcg by mouth daily.   Yes [provider]  nitroGLYCERIN (NITROSTAT) 0.4 MG SL tablet Place 1 tablet (0.4 mg total) under the tongue every 5 (five) minutes as needed for chest pain. 02/25/13  Yes Arguello, Roger A, PA-C  pregabalin (LYRICA) 150 MG capsule Take 150 mg by mouth daily.    Yes [provider]  simvastatin (ZOCOR) 40 MG tablet Take 40 mg by mouth every evening.    Yes [provider]  Tamsulosin HCl (FLOMAX) 0.4 MG CAPS Take 0.8 mg by mouth daily.    Yes [provider]    Allergies:  Allergies  Allergen Reactions  . Codeine Nausea Only    Other reaction(s): Vomiting  . Penicillins     Rash     Social History   Social History  . Marital status: Married    Spouse name: N/A  . Number of children: N/A  . Years of education: N/A   Occupational History  . Not on file.   Social History Main Topics  . Smoking status: Never Smoker  . Smokeless tobacco: Never Used  . Alcohol use 3.6 - 4.8 oz/week  6 - 8 Glasses of wine per week  . Drug use: No  . Sexual activity: Not on file   Other Topics Concern  . Not on file   Social History Narrative  . No narrative on file     Family History  Problem Relation Age of Onset  . Hypertension Mother   . Cancer Other   . Hyperlipidemia Other   . Hypertension Other   . Other Other        CHF     ROS:  Please see the history of present illness.     All other systems reviewed and negative.    Physical Exam:   Blood pressure (!) 160/92, pulse (!) 53, height 5\' 6"  (1.676 m), weight 190 lb 4 oz (86.3 kg). General: Well developed, well nourished male in no acute distress. Head: Normocephalic, atraumatic, sclera non-icteric, no xanthomas, nares are without  discharge. EENT: normal  Lymph Nodes:  none Neck: Negative for carotid bruits. JVD not elevated. Back:without scoliosis kyphosis  Lungs: Clear bilaterally to auscultation without wheezes, rales, or rhonchi. Breathing is unlabored. Heart: RRR with S1 S2.  2/6 systolic murmur . No rubs, or gallops appreciated. Abdomen: Soft, non-tender, non-distended with normoactive bowel sounds. No hepatomegaly. No rebound/guarding. No obvious abdominal masses. Msk:  Strength and tone appear normal for age. Extremities: No clubbing or cyanosis. No  edema.  Distal pedal pulses are 2+ and equal bilaterally. Skin: Warm and Dry Neuro: Alert and oriented X 3. CN III-XII intact Grossly normal sensory and motor function tremor. Psych:  Responds to questions appropriately with a normal affect.      Labs: Cardiac Enzymes No results for input(s): CKTOTAL, CKMB, TROPONINI in the last 72 hours. CBC Lab Results  Component Value Date   WBC 10.5 01/05/2017   HGB 15.5 01/05/2017   HCT 45.3 01/05/2017   MCV 95.2 01/05/2017   PLT 160 01/05/2017   PROTIME: No results for input(s): LABPROT, INR in the last 72 hours. Chemistry No results for input(s): NA, K, CL, CO2, BUN, CREATININE, CALCIUM, PROT, BILITOT, ALKPHOS, ALT, AST, GLUCOSE in the last 168 hours.  Invalid input(s): LABALBU Lipids Lab Results  Component Value Date   CHOL 122 01/23/2010   HDL 33.8 01/23/2010   LDLCALC 69.6 01/23/2010   TRIG 93 01/23/2010   BNP No results found for: PROBNP Thyroid Function Tests: No results for input(s): TSH, T4TOTAL, T3FREE, THYROIDAB in the last 72 hours.  Invalid input(s): FREET3 Miscellaneous No results found for: DDIMER  Radiology/Studies:  No results found.  EKG Sinus rhythm at 53 Intervals 23/10/45 Axis -50  Underlying tremor Event Recorder personnally reviewed  nonsustained atrial tachycardia and PVCs   Assessment and Plan:  Cryptogenic stroke with hemorrhagic  transformation  Palpitations-flutters  Hypertension  Hyperlipidemia   The patient has had a cryptogenic stroke. Unfortunately was complicated by hemorrhagic transformation. At the family's request I reviewed the MRI from [facility] earlier this week showed resolving hematoma. were reviewed also from [facility] where it was recommended to consider loop recorder insertion to look for atrial fibrillation. The patient does have a history of palpitations. This reviewed with the family and had demonstrated PVCs corresponding with his symptoms. Also nonsustained atrial tachycardia some of which was quite irregular but lasted less than 3-5 seconds.  Hence, I think it is reasonable to pursue loop recorder insertion. We have reviewed risks and benefits.  I will review with Dr. Mariah Milling statin therapy in light of the SPARCL trial.  Reviewing the guidelines  published 2018, moderate to high-dose statins are both recommended in patients over 75. I don't know about the benefit of the zetia  His blood pressure is elevated today. We will resume his lisinopril at 5 mg daily       Sherryl Manges

## 2017-05-09 NOTE — Patient Instructions (Signed)
Medication Instructions: - Your physician recommends that you continue on your current medications as directed. Please refer to the Current Medication list given to you today.  Labwork: - none ordered  Procedures/Testing: - Your physician has recommended that you have a loop recorder (LINQ) implant  - Please arrive at the [facility] on _____________ at ______________ (1st desk on the right is registration) - You may have a light breakfast the morning of your procedure - You may take your medications the morning of your procedure - Please wash your chest and neck area with an antibacterial soap the night before and the morning of your procedure  Follow-Up: - pending procedure date, you will be scheduled for a wound check appointment with our Device Nurses about 10-14 days from the procedure date  Any Additional Special Instructions Will Be Listed Below (If Applicable).     If you need a refill on your cardiac medications before your next appointment, please call your pharmacy.

## 2017-05-13 ENCOUNTER — Ambulatory Visit: Payer: Medicare Other

## 2017-05-13 ENCOUNTER — Ambulatory Visit: Payer: Medicare Other | Admitting: Occupational Therapy

## 2017-05-13 ENCOUNTER — Encounter: Payer: Self-pay | Admitting: Speech Pathology

## 2017-05-13 ENCOUNTER — Ambulatory Visit: Payer: Medicare Other | Admitting: Speech Pathology

## 2017-05-13 DIAGNOSIS — H5347 Heteronymous bilateral field defects: Secondary | ICD-10-CM

## 2017-05-13 DIAGNOSIS — M6281 Muscle weakness (generalized): Secondary | ICD-10-CM | POA: Diagnosis not present

## 2017-05-13 DIAGNOSIS — R4701 Aphasia: Secondary | ICD-10-CM

## 2017-05-13 NOTE — Therapy (Signed)
[place] MAIN [facility] SERVICES [address] [place], [place], [address] Phone: [phone]   Fax:  [phone]  Speech Language Pathology Treatment  Patient Details  Name: [name]. MRN: [id number] Date of Birth: June 19, 1933 Referring Provider: Launa Grill, MD  Encounter Date: 05/13/2017      End of Session - 05/13/17 1232    Visit Number 25   Number of Visits 33   Date for SLP Re-Evaluation 05/27/17   SLP Start Time 1000   SLP Stop Time  1050   SLP Time Calculation (min) 50 min   Activity Tolerance Patient tolerated treatment well      Past Medical History:  Diagnosis Date  . BPH (benign prostatic hyperplasia)   . Coronary artery disease   . Hypertension     Past Surgical History:  Procedure Laterality Date  . Intestinal Blockage    . SKIN CANCER EXCISION     x2  . TONSILLECTOMY      There were no vitals filed for this visit.      Subjective Assessment - 05/13/17 1231    Subjective Patient was frustrated at times but worked hard on everything the clinician gave him.   Currently in Pain? No/denies               ADULT SLP TREATMENT - 05/13/17 0001      General Information   Behavior/Cognition Alert;Cooperative;Pleasant mood     Treatment Provided   Treatment provided Cognitive-Linquistic     Pain Assessment   Pain Assessment No/denies pain     Cognitive-Linquistic Treatment   Treatment focused on Aphasia   Skilled Treatment The patient completed math word problems with minimal cueing from the clinician. He required skilled cueing for 2 of the 7 problems, and was able to verbalize his thought process once he had the solution. The patient was able to describe with adequate detail 7 "how to" functional skills given moderate cueing and scaffolding techniques from the clinician. The patient was also able to answer questions about 5 daily skills (such as changing a lightbulb) with moderate cues for  content words from the clinician.      Assessment / Recommendations / Plan   Plan Continue with current plan of care     Progression Toward Goals   Progression toward goals Progressing toward goals          SLP Education - 05/13/17 1231    Education provided Yes   Education Details Making use of written instructions, circumlocution when having word-finding difficulties   Person(s) Educated Patient   Methods Explanation   Comprehension Verbalized understanding          SLP Short Term Goals - 04/01/17 1015      SLP SHORT TERM GOAL #1   Baseline 04/01/17 - avg 8          SLP Long Term Goals - 04/01/17 1233      SLP LONG TERM GOAL #1   Title pt will use compensations for functional 10 minute mod-max complex conversation   Time 5   Period Weeks   Status On-going     SLP LONG TERM GOAL #2   Title  pt will demo [facility] tasks requiring memory compensations (meds, appointments, etc)   Status Achieved     SLP LONG TERM GOAL #3   Title pt will demo functional reading comprehension in 3-4 sentence paragraphs   Status Achieved     SLP LONG TERM GOAL #  4   Title Patient will generate grammatical and meaningful sentences to complete moderate cognitive-linguistic task with 80% accruacy.   Time 7   Status Partially Met     SLP LONG TERM GOAL #5   Title Patient will complete high level word finding activities with 80% accuracy.   Time 7   Period Weeks   Status Partially Met          Plan - 05/13/17 1232    Clinical Impression Statement The patient provided adequate descriptions for functional tasks and is progressing in his ability to effectively verbalize his thought process for math word problems. The patient is also using more content words when describing activities but needs to make use of circumlocution and gesturing more when he is having word-finding difficulties.    Speech Therapy Frequency 2x / week   Duration Other (comment)   Treatment/Interventions Language  facilitation;Cueing hierarchy;SLP instruction and feedback;Cognitive reorganization   Potential to Achieve Goals Good   Potential Considerations Ability to learn/carryover information;Cooperation/participation level;Previous level of function;Severity of impairments   SLP Home Exercise Plan Subtraction problems with instructions   Consulted and Agree with Plan of Care Patient      Patient will benefit from skilled therapeutic intervention in order to improve the following deficits and impairments:   Aphasia    Problem List Patient Active Problem List   Diagnosis Date Noted  . Stenosis of right carotid artery 02/01/2017  . Acute embolic stroke ([place]) 61/47/0929  . Bradycardia 11/10/2015  . Essential tremor 06/03/2015  . Dizziness 08/21/2012  . Syncope 04/10/2011  . CALF PAIN, LEFT 10/11/2010  . DYSPEPSIA 07/31/2010  . CHEST DISCOMFORT 07/31/2010  . Hyperlipidemia 05/16/2010  . HYPERTENSION, BENIGN 05/16/2010  . CAD, NATIVE VESSEL 05/16/2010    Aline August 05/13/2017, 12:34 PM  [place] MAIN [facility] SERVICES [address] [place], [place], [address] Phone: [phone]   Fax:  [phone]   Name: Jarrick Fjeld [facility]. MRN: [id number] Date of Birth: 1933/08/24

## 2017-05-13 NOTE — Addendum Note (Signed)
Addended by: Avon GullyJAGENTENFL, Danon Lograsso M on: 05/13/2017 10:06 AM   Modules accepted: Orders

## 2017-05-13 NOTE — Therapy (Addendum)
[place] [facility] [address] [place], [place], [address] Phone: [phone]   Fax:  [phone]  Occupational Therapy Treatment/G-Code/Recertification Note  Patient Details  Name: Calvin AmberGeorge Stanford Allington Jr. MRN: [id number] Date of Birth: 1932-12-09 Referring Provider: Dr. Wilfrid LundJason Downs  Encounter Date: 05/13/2017      OT End of Session - 05/13/17 0929    Visit Number 21   Number of Visits 32   Date for OT Re-Evaluation 08/06/17   Authorization Type medicare G-code 10   Activity Tolerance Patient tolerated treatment well   Behavior [facility] Therapy [facility] for tasks assessed/performed      Past Medical History:  Diagnosis Date  . BPH (benign prostatic hyperplasia)   . Coronary artery disease   . Hypertension     Past Surgical History:  Procedure Laterality Date  . Intestinal Blockage    . SKIN CANCER EXCISION     x2  . TONSILLECTOMY      There were no vitals filed for this visit.      Subjective Assessment - 05/13/17 0922    Subjective  Pt. reports he had a good weekend, however doesn't always remember what he did on the weekends.   Patient is accompained by: Family member   Patient Stated Goals I want to get well and be perfect again.  I hate these questions (pt unable to give specific answer)   Currently in Pain? No/denies       OT TREATMENT    Selfcare:  Pt. worked on tabletop visual scanning tasks, and visual saccades right to left at 6 inches apart, 12 inches, and shoulder width apart. Pt. Consistently had increased difficulty with the task placed at 12 inches apart. Pt. Worked on visual scanning and visual search for tabletop tasks navigating mazes. Pt. required cues, and assist to complete.                          OT Education - 05/13/17 [id number]    Education provided Yes   Education Details visual search strategies, visual scanning.   Person(s) Educated Patient   Methods  Explanation;Demonstration   Comprehension Verbalized understanding             OT Long Term Goals - 05/13/17 0936      OT LONG TERM GOAL #1   Title Pt and wife will be mod I with ugraded HEP prn - 03/21/2017   Time 12   Period Weeks   Status On-going     OT LONG TERM GOAL #5   Title Pt will require no more than 2 vc's to recall information from therapy session   Time 12   Period Weeks   Status On-going     OT LONG TERM GOAL #6   Title Pt will complete table top scanning activity (not language based) efficiently with 90% accuracy   Time 12   Period Weeks   Status Revised     OT LONG TERM GOAL #7   Title Pt will complete environmental scanning tasks efficiently with 100% accuracy    Time 12   Period Weeks   Status On-going     OT LONG TERM GOAL #8   Title Pt will perform simple divided attention with environmental scanning for improved safety for ambulation/in prep for driving with at least 95%75% accuracy.   Baseline Pt. will demonstrate visual compensatory strategies 100% of the time during ADLs, and IADLs.    Time  12   Period Weeks   Status New     G-Code: Functional Assessment Tool Used: Skilled Clinical Observation Self-care status [id number]): CI Self Care Goal Status (U0454): [facility]           Plan - 05/13/17 0929    Clinical Impression Statement Pt. continues to present with right sided visual impairments. Pt. continues to work on visual scanning, visual search strategies, and visual compensatory techniques for improved ADL, and IADL functioning. Pt. goals were reviewed and modified with pt.   Occupational performance deficits (Please refer to evaluation for details): ADL's   Rehab Potential Good   OT Frequency 2x / week   OT Duration 8 weeks   OT Treatment/Interventions Self-care/ADL training;Electrical Stimulation;Therapeutic exercise;Neuromuscular education;DME and/or AE instruction;Energy conservation;Building services engineer;Therapeutic  activities;Cognitive remediation/compensation;Visual/perceptual remediation/compensation;Patient/family education;Balance training   Consulted and Agree with Plan of Care Patient      Patient will benefit from skilled therapeutic intervention in order to improve the following deficits and impairments:  Decreased balance, Decreased cognition, Decreased coordination, Decreased safety awareness, Decreased mobility, Decreased knowledge of use of DME, Impaired UE functional use, Impaired vision/preception  Visit Diagnosis: Hemianopsia    Problem List Patient Active Problem List   Diagnosis Date Noted  . Stenosis of right carotid artery 02/01/2017  . Acute embolic stroke ([facility]) 02/01/2017  . Bradycardia 11/10/2015  . Essential tremor 06/03/2015  . Dizziness 08/21/2012  . Syncope 04/10/2011  . CALF PAIN, LEFT 10/11/2010  . DYSPEPSIA 07/31/2010  . CHEST DISCOMFORT 07/31/2010  . Hyperlipidemia 05/16/2010  . HYPERTENSION, BENIGN 05/16/2010  . CAD, NATIVE VESSEL 05/16/2010    Olegario Messier, MS, OTR/L 05/13/2017, 9:41 AM  [place] [facility] MAIN [facility] SERVICES [address] [place], [place], [address] Phone: [phone]   Fax:  [phone]  Name: [name] [facility]. MRN: [id number] Date of Birth: Aug 21, 1933

## 2017-05-14 ENCOUNTER — Ambulatory Visit
Admission: RE | Admit: 2017-05-14 | Discharge: 2017-05-14 | Disposition: A | Discharge status: Home / Self Care | Payer: Medicare Other | Source: Ambulatory Visit | Attending: Internal Medicine | Admitting: Internal Medicine

## 2017-05-14 ENCOUNTER — Encounter: Payer: Self-pay | Admitting: *Deleted

## 2017-05-14 ENCOUNTER — Encounter
Admission: RE | Disposition: A | Discharge status: Home / Self Care | Payer: Self-pay | Source: Ambulatory Visit | Attending: Internal Medicine

## 2017-05-14 DIAGNOSIS — I10 Essential (primary) hypertension: Secondary | ICD-10-CM | POA: Insufficient documentation

## 2017-05-14 DIAGNOSIS — I493 Ventricular premature depolarization: Secondary | ICD-10-CM | POA: Diagnosis not present

## 2017-05-14 DIAGNOSIS — E785 Hyperlipidemia, unspecified: Secondary | ICD-10-CM | POA: Insufficient documentation

## 2017-05-14 DIAGNOSIS — Z880 Allergy status to penicillin: Secondary | ICD-10-CM | POA: Insufficient documentation

## 2017-05-14 DIAGNOSIS — N400 Enlarged prostate without lower urinary tract symptoms: Secondary | ICD-10-CM | POA: Insufficient documentation

## 2017-05-14 DIAGNOSIS — Z7982 Long term (current) use of aspirin: Secondary | ICD-10-CM | POA: Diagnosis not present

## 2017-05-14 DIAGNOSIS — I638 Other cerebral infarction: Secondary | ICD-10-CM | POA: Diagnosis not present

## 2017-05-14 DIAGNOSIS — I471 Supraventricular tachycardia: Secondary | ICD-10-CM | POA: Insufficient documentation

## 2017-05-14 DIAGNOSIS — I2510 Atherosclerotic heart disease of native coronary artery without angina pectoris: Secondary | ICD-10-CM | POA: Insufficient documentation

## 2017-05-14 DIAGNOSIS — G250 Essential tremor: Secondary | ICD-10-CM | POA: Insufficient documentation

## 2017-05-14 DIAGNOSIS — I639 Cerebral infarction, unspecified: Secondary | ICD-10-CM

## 2017-05-14 HISTORY — PX: LOOP RECORDER INSERTION: EP1214

## 2017-05-14 SURGERY — LOOP RECORDER INSERTION
Anesthesia: Moderate Sedation

## 2017-05-14 MED ORDER — LIDOCAINE-EPINEPHRINE (PF) 1 %-1:200000 IJ SOLN
INTRAMUSCULAR | Status: AC
Start: 2017-05-14 — End: 2017-05-14
  Filled 2017-05-14: qty 30

## 2017-05-14 SURGICAL SUPPLY — 2 items
LOOP REVEAL LINQSYS (Prosthesis & Implant Heart) ×2 IMPLANT
PACK LOOP INSERTION (CUSTOM PROCEDURE TRAY) ×2 IMPLANT

## 2017-05-14 NOTE — H&P (View-Only) (Signed)
ELECTROPHYSIOLOGY CONSULT NOTE  Patient ID: [name]., MRN: [id number], DOB/AGE: 08-14-1933 83 y.o. Admit date: (Not on file) Date of Consult: 05/09/2017  Primary Physician: Marguarite ArbourSparks, Calvin D, MD Primary Cardiologist: Calvin PicklerG   Gilmore Stanford Unice BaileyBason Pollard. is being seen today for the evaluation of implantable linq insertion and palpitations at the request of Dr [name]  HPI [name]. is Pollard 83 y.o. male  Seen for consideration of loop recorder following Pollard stroke 2/18   He presented with aphasia and imaging revealed what was thought to be embolic stroke subsequently complicated by hemorrhagic transformation  Pollard 14 day monitor demonstrated PVC correlating with "fluttering:"  Nonsustained atrial tach was also noted  [facility] reviewed- suggested implantation of LINQ for identification of atrial fib.  Echo ([facility]) normal LV function with LAE ( 41/-/-)  He has hx of essential tremor not responsive to meds thus far  He is on statin therapy-- with mod dose simva + recently added zetia.    Labs 4/18 normal [facility]  LDL 4/18 48          Past Medical History:  Diagnosis Date  . BPH (benign prostatic hyperplasia)   . Coronary artery disease   . Hypertension       Surgical History:  Past Surgical History:  Procedure Laterality Date  . Intestinal Blockage    . SKIN CANCER EXCISION     x2  . TONSILLECTOMY       Home Meds: Prior to Admission medications   Medication Sig Start Date End Date Taking? Authorizing Provider  aspirin 81 MG tablet Take 81 mg by mouth daily.     Yes [provider]  b complex vitamins tablet Take 1 tablet by mouth daily.   Yes [provider]  carbidopa-levodopa (PARCOPA) 25-100 MG disintegrating tablet Take 1 tablet by mouth 2 (two) times daily.   Yes [provider]  cholecalciferol (VITAMIN D) 1000 units tablet Take 2,000 Units by mouth daily.   Yes [provider]    ezetimibe (ZETIA) 10 MG tablet Take 1 tablet (10 mg total) by mouth daily. 02/01/17  Yes Pollard, Calvin Pizzaimothy J, MD  FLAXSEED, LINSEED, PO Take 2 g by mouth daily.   Yes [provider]  folic acid (FOLVITE) 400 MCG tablet Take 400 mcg by mouth daily.   Yes [provider]  nitroGLYCERIN (NITROSTAT) 0.4 MG SL tablet Place 1 tablet (0.4 mg total) under the tongue every 5 (five) minutes as needed for chest pain. 02/25/13  Yes Calvin, Roger A, PA-C  pregabalin (LYRICA) 150 MG capsule Take 150 mg by mouth daily.    Yes [provider]  simvastatin (ZOCOR) 40 MG tablet Take 40 mg by mouth every evening.    Yes [provider]  Tamsulosin HCl (FLOMAX) 0.4 MG CAPS Take 0.8 mg by mouth daily.    Yes [provider]    Allergies:  Allergies  Allergen Reactions  . Codeine Nausea Only    Other reaction(s): Vomiting  . Penicillins     Rash     Social History   Social History  . Marital status: Married    Spouse name: N/Pollard  . Number of children: N/Pollard  . Years of education: N/Pollard   Occupational History  . Not on file.   Social History Main Topics  . Smoking status: Never Smoker  . Smokeless tobacco: Never Used  . Alcohol use 3.6 - 4.8 oz/week  6 - 8 Glasses of wine per week  . Drug use: No  . Sexual activity: Not on file   Other Topics Concern  . Not on file   Social History Narrative  . No narrative on file     Family History  Problem Relation Age of Onset  . Hypertension Mother   . Cancer Other   . Hyperlipidemia Other   . Hypertension Other   . Other Other        CHF     ROS:  Please see the history of present illness.     All other systems reviewed and negative.    Physical Exam:   Blood pressure (!) 160/92, pulse (!) 53, height 5\' 6"  (1.676 m), weight 190 lb 4 oz (86.3 kg). General: Well developed, well nourished male in no acute distress. Head: Normocephalic, atraumatic, sclera non-icteric, no xanthomas, nares are without  discharge. EENT: normal  Lymph Nodes:  none Neck: Negative for carotid bruits. JVD not elevated. Back:without scoliosis kyphosis  Lungs: Clear bilaterally to auscultation without wheezes, rales, or rhonchi. Breathing is unlabored. Heart: RRR with S1 S2.  2/6 systolic murmur . No rubs, or gallops appreciated. Abdomen: Soft, non-tender, non-distended with normoactive bowel sounds. No hepatomegaly. No rebound/guarding. No obvious abdominal masses. Msk:  Strength and tone appear normal for age. Extremities: No clubbing or cyanosis. No  edema.  Distal pedal pulses are 2+ and equal bilaterally. Skin: Warm and Dry Neuro: Alert and oriented X 3. CN III-XII intact Grossly normal sensory and motor function tremor. Psych:  Responds to questions appropriately with Pollard normal affect.      Labs: Cardiac Enzymes No results for input(s): CKTOTAL, CKMB, TROPONINI in the last 72 hours. CBC Lab Results  Component Value Date   WBC 10.5 01/05/2017   HGB 15.5 01/05/2017   HCT 45.3 01/05/2017   MCV 95.2 01/05/2017   PLT 160 01/05/2017   PROTIME: No results for input(s): LABPROT, INR in the last 72 hours. Chemistry No results for input(s): NA, K, CL, CO2, BUN, CREATININE, CALCIUM, PROT, BILITOT, ALKPHOS, ALT, AST, GLUCOSE in the last 168 hours.  Invalid input(s): LABALBU Lipids Lab Results  Component Value Date   CHOL 122 01/23/2010   HDL 33.8 01/23/2010   LDLCALC 69.6 01/23/2010   TRIG 93 01/23/2010   BNP No results found for: PROBNP Thyroid Function Tests: No results for input(s): TSH, T4TOTAL, T3FREE, THYROIDAB in the last 72 hours.  Invalid input(s): FREET3 Miscellaneous No results found for: DDIMER  Radiology/Studies:  No results found.  EKG Sinus rhythm at 53 Intervals 23/10/45 Axis -50  Underlying tremor Event Recorder personnally reviewed  nonsustained atrial tachycardia and PVCs   Assessment and Plan:  Cryptogenic stroke with hemorrhagic  transformation  Palpitations-flutters  Hypertension  Hyperlipidemia   The patient has had Pollard cryptogenic stroke. Unfortunately was complicated by hemorrhagic transformation. At the family's request I reviewed the MRI from [facility] earlier this week showed resolving hematoma. were reviewed also from [facility] where it was recommended to consider loop recorder insertion to look for atrial fibrillation. The patient does have Pollard history of palpitations. This reviewed with the family and had demonstrated PVCs corresponding with his symptoms. Also nonsustained atrial tachycardia some of which was quite irregular but lasted less than 3-5 seconds.  Hence, I think it is reasonable to pursue loop recorder insertion. We have reviewed risks and benefits.  I will review with Dr. Mariah Milling statin therapy in light of the SPARCL trial.  Reviewing the guidelines  published 2018, moderate to high-dose statins are both recommended in patients over 75. I don't know about the benefit of the zetia  His blood pressure is elevated today. We will resume his lisinopril at 5 mg daily       Sherryl Manges

## 2017-05-14 NOTE — Interval H&P Note (Signed)
History and Physical Interval Note:  05/14/2017 8:25 AM  Calvin ShortsGeorge [name]  has presented today for surgery, with the diagnosis of Loop Recorder   Bedside  8:30a  Dr Graciela HusbandsKlein        Stroke  The various methods of treatment have been discussed with the patient and family. After consideration of risks, benefits and other options for treatment, the patient has consented to  Procedure(s): Loop Recorder Insertion (N/A) as a surgical intervention .  The patient's history has been reviewed, patient examined, no change in status, stable for surgery.  I have reviewed the patient's chart and labs.  Questions were answered to the patient's satisfaction.     Sherryl MangesSteven Klein

## 2017-05-14 NOTE — Discharge Instructions (Signed)
Remove bulky dressing in am; leave steristrips on ; may be removed at office on June 22,2018

## 2017-05-15 ENCOUNTER — Ambulatory Visit: Payer: Medicare Other | Admitting: Speech Pathology

## 2017-05-15 ENCOUNTER — Ambulatory Visit: Payer: Medicare Other

## 2017-05-15 ENCOUNTER — Ambulatory Visit: Payer: Medicare Other | Admitting: Occupational Therapy

## 2017-05-15 ENCOUNTER — Encounter: Payer: Self-pay | Admitting: Occupational Therapy

## 2017-05-15 ENCOUNTER — Telehealth: Payer: Self-pay | Admitting: Cardiology

## 2017-05-15 ENCOUNTER — Encounter: Payer: Self-pay | Admitting: Speech Pathology

## 2017-05-15 DIAGNOSIS — R4701 Aphasia: Secondary | ICD-10-CM

## 2017-05-15 DIAGNOSIS — M6281 Muscle weakness (generalized): Secondary | ICD-10-CM | POA: Diagnosis not present

## 2017-05-15 DIAGNOSIS — H5347 Heteronymous bilateral field defects: Secondary | ICD-10-CM

## 2017-05-15 NOTE — Therapy (Signed)
[place] MAIN [facility] SERVICES [address] [place], [place], [address] Phone: [phone]   Fax:  [phone]  Speech Language Pathology Treatment  Patient Details  Name: [name]. MRN: [id number] Date of Birth: 1933/03/08 Referring Provider: Launa Grill, MD  Encounter Date: 05/15/2017      End of Session - 05/15/17 1304    Visit Number 26   Number of Visits 33   Date for SLP Re-Evaluation 05/27/17   SLP Start Time 1000   SLP Stop Time  1100   SLP Time Calculation (min) 60 min   Activity Tolerance Patient tolerated treatment well      Past Medical History:  Diagnosis Date  . BPH (benign prostatic hyperplasia)   . Coronary artery disease   . Hypertension     Past Surgical History:  Procedure Laterality Date  . Intestinal Blockage    . LOOP RECORDER INSERTION N/A 05/14/2017   Procedure: Loop Recorder Insertion;  Surgeon: [name], MD;  Location: [place] [facility];  Service: Cardiovascular;  Laterality: N/A;  . SKIN CANCER EXCISION     x2  . TONSILLECTOMY      There were no vitals filed for this visit.      Subjective Assessment - 05/15/17 1304    Subjective Patient brought in homework from previous session but admitted that he looked at the answers instead of trying to figure it out himself.   Currently in Pain? No/denies               ADULT SLP TREATMENT - 05/15/17 0001      General Information   Behavior/Cognition Alert;Cooperative;Pleasant mood     Treatment Provided   Treatment provided Cognitive-Linquistic     Pain Assessment   Pain Assessment No/denies pain     Cognitive-Linquistic Treatment   Treatment focused on Aphasia   Skilled Treatment Patient completed two-digit complex subtraction problems with maximal cueing from the clinician. The clinician provided him with new, more detailed written instructions. The patient required cueing to refer to the instructions, but later  in the session cued himself to refer to the instructions. The patient completed a task on giving detailed explanations for functional skills such as cooking, and was able to provide adequate content and instructions 60% of the time. The patient was unable to perform a multiple meaning words task given maximal cueing from the clinician.     Assessment / Recommendations / Plan   Plan Continue with current plan of care     Progression Toward Goals   Progression toward goals Progressing toward goals          SLP Education - 05/15/17 1304    Education provided Yes   Education Details Following written instructions   Person(s) Educated Patient   Methods Explanation   Comprehension Verbalized understanding          SLP Short Term Goals - 04/01/17 1015      SLP SHORT TERM GOAL #1   Baseline 04/01/17 - avg 8          SLP Long Term Goals - 04/01/17 1233      SLP LONG TERM GOAL #1   Title pt will use compensations for functional 10 minute mod-max complex conversation   Time 5   Period Weeks   Status On-going     SLP LONG TERM GOAL #2   Title  pt will demo [facility] tasks requiring memory compensations (meds, appointments, etc)  Status Achieved     SLP LONG TERM GOAL #3   Title pt will demo functional reading comprehension in 3-4 sentence paragraphs   Status Achieved     SLP LONG TERM GOAL #4   Title Patient will generate grammatical and meaningful sentences to complete moderate cognitive-linguistic task with 80% accruacy.   Time 7   Status Partially Met     SLP LONG TERM GOAL #5   Title Patient will complete high level word finding activities with 80% accuracy.   Time 7   Period Weeks   Status Partially Met          Plan - 05/15/17 1305    Clinical Impression Statement The patient is using more content words when describing activities and in conversation. The patient is still learning to use his resources, such as written instructions, when completing a difficult task.  In future sessions, the clinician will put an emphasis on self-cueing and on following and describing written instructions.   Speech Therapy Frequency 2x / week   Duration Other (comment)   Treatment/Interventions Language facilitation;Cueing hierarchy;SLP instruction and feedback;Cognitive reorganization   Potential to Achieve Goals Good   Potential Considerations Ability to learn/carryover information;Cooperation/participation level;Previous level of function;Severity of impairments   SLP Home Exercise Plan Subtraction problems with instructions   Consulted and Agree with Plan of Care Patient      Patient will benefit from skilled therapeutic intervention in order to improve the following deficits and impairments:   Aphasia    Problem List Patient Active Problem List   Diagnosis Date Noted  . Stenosis of right carotid artery 02/01/2017  . Acute embolic stroke ([place]) 75/83/0746  . Bradycardia 11/10/2015  . Essential tremor 06/03/2015  . Dizziness 08/21/2012  . Syncope 04/10/2011  . CALF PAIN, LEFT 10/11/2010  . DYSPEPSIA 07/31/2010  . CHEST DISCOMFORT 07/31/2010  . Hyperlipidemia 05/16/2010  . HYPERTENSION, BENIGN 05/16/2010  . CAD, NATIVE VESSEL 05/16/2010    Aline August 05/15/2017, 1:06 PM  [place] MAIN [facility] SERVICES [address] [place], [place], [address] Phone: [phone]   Fax:  [phone]   Name: Calvin Hert [facility]. MRN: [id number] Date of Birth: 08/16/1933

## 2017-05-15 NOTE — Therapy (Signed)
[place] MAIN [facility] SERVICES [address] [place], [place], [address] Phone: [phone]   Fax:  [phone]  Occupational Therapy Treatment  Patient Details  Name: [name]. MRN: [id number] Date of Birth: 03/13/1933 Referring Provider: Dr. Rea College  Encounter Date: 05/15/2017      OT End of Session - 05/15/17 0939    Visit Number 22   Number of Visits 32   Date for OT Re-Evaluation 08/06/17   Authorization Type medicare G-code 1   OT Start Time 0919   OT Stop Time 1000   OT Time Calculation (min) 41 min   Activity Tolerance Patient tolerated treatment well   Behavior During Therapy [facility] for tasks assessed/performed      Past Medical History:  Diagnosis Date  . BPH (benign prostatic hyperplasia)   . Coronary artery disease   . Hypertension     Past Surgical History:  Procedure Laterality Date  . Intestinal Blockage    . LOOP RECORDER INSERTION N/A 05/14/2017   Procedure: Loop Recorder Insertion;  Surgeon: [name], MD;  Location: [place] [facility];  Service: Cardiovascular;  Laterality: N/A;  . SKIN CANCER EXCISION     x2  . TONSILLECTOMY      There were no vitals filed for this visit.      Subjective Assessment - 05/15/17 0928    Subjective  Pt. reports he feels like he is doing well.   Patient is accompained by: Family member   Pertinent History see epic.  L CVA, R hemiopsia, Parkinson's, essential tremor   Patient Stated Goals I want to get well and be perfect again. I hate these questions (pt unable to give specific answer)   Currently in Pain? No/denies      OT TREATMENT    Selfcare:  Pt. worked on tabletop visual scanning tasks, and visual saccades right to left at 6 inches apart, 12 inches, and shoulder width apart. Pt. Had less difficulty than the previous session, however pt. had difficulty with the task placed at 12 inches apart, but was able to make corrections. Pt. required   Increased time to complete. Pt. worked on visual scanning in near space for tabletop ADL tasks in preparation for reading, and writing. Pt. requires verbal cues for visual scanning, and visual search strategies through word searches 59M size. Pt. Utilized a horizontal rectilinear visual search strategy. Pt. With improved speed, however continues to require increased time to complete.                             OT Education - 05/15/17 0932    Education provided Yes   Education Details Pt. education was provided about visual scanning, and visual search patterns to the right during tabletop tasks.    Person(s) Educated Patient   Methods Explanation   Comprehension Verbalized understanding          OT Short Term Goals - 02/14/17 1457      OT SHORT TERM GOAL #1   Title Pt and wife will be mod I with HEP - 02/21/2017   Time 4   Period Weeks   Status On-going  needs reinforcement of coordination HEP     OT SHORT TERM GOAL #2   Title Pt will be mod I with shower transfers   Status Achieved  met per pt report     OT SHORT TERM GOAL #3   Title  Pt will be mod I with bathing   Status Achieved     OT SHORT TERM GOAL #4   Title Pt will complete table top scanning activity (not lanaguage or letter based) with 75% accuracy   Status Achieved  met for copying small peg design with min v.c.     OT SHORT TERM GOAL #5   Title Pt will attend to familiar task in busy environment with no more than min vc's.     Status Achieved           OT Long Term Goals - 05/13/17 0936      OT LONG TERM GOAL #1   Title Pt and wife will be mod I with ugraded HEP prn - 03/21/2017   Time 12   Period Weeks   Status On-going     OT LONG TERM GOAL #5   Title Pt will require no more than 2 vc's to recall information from therapy session   Time 12   Period Weeks   Status On-going     OT LONG TERM GOAL #6   Title Pt will complete table top scanning activity (not language based)  efficiently with 90% accuracy   Time 12   Period Weeks   Status Revised     OT LONG TERM GOAL #7   Title Pt will complete environmental scanning tasks efficiently with 100% accuracy    Time 12   Period Weeks   Status On-going     OT LONG TERM GOAL #8   Title Pt will perform simple divided attention with environmental scanning for improved safety for ambulation/in prep for driving with at least 75% accuracy.   Baseline Pt. will demonstrate visual compensatory strategies 100% of the time during ADLs, and IADLs.    Time 12   Period Weeks   Status New               Plan - 05/15/17 0939    Clinical Impression Statement Pt. reports having had a loop recorder insertion procedure yesterday. Pt. reports no pain. Pt. continues to benefit from OT services for visual scanning, visual search strategies, and visual compensatory techniques for ADL, and IADL functioning.   Occupational performance deficits (Please refer to evaluation for details): ADL's   Rehab Potential Good   OT Frequency 2x / week   OT Duration 8 weeks   OT Treatment/Interventions Self-care/ADL training;Electrical Stimulation;Therapeutic exercise;Neuromuscular education;DME and/or AE instruction;Energy conservation;Therapist, nutritional;Therapeutic activities;Cognitive remediation/compensation;Visual/perceptual remediation/compensation;Patient/family education;Balance training   Consulted and Agree with Plan of Care Patient      Patient will benefit from skilled therapeutic intervention in order to improve the following deficits and impairments:  Decreased balance, Decreased cognition, Decreased coordination, Decreased safety awareness, Decreased mobility, Decreased knowledge of use of DME, Impaired UE functional use, Impaired vision/preception  Visit Diagnosis: Hemianopsia    Problem List Patient Active Problem List   Diagnosis Date Noted  . Stenosis of right carotid artery 02/01/2017  . Acute embolic  stroke ([place]) 81/82/9937  . Bradycardia 11/10/2015  . Essential tremor 06/03/2015  . Dizziness 08/21/2012  . Syncope 04/10/2011  . CALF PAIN, LEFT 10/11/2010  . DYSPEPSIA 07/31/2010  . CHEST DISCOMFORT 07/31/2010  . Hyperlipidemia 05/16/2010  . HYPERTENSION, BENIGN 05/16/2010  . CAD, NATIVE VESSEL 05/16/2010    Harrel Carina, MS, OTR/L 05/15/2017, 9:50 AM  [place] MAIN [facility] SERVICES [address] [place], [place], [address] Phone: [phone]   Fax:  [phone]  Name: [name]  [employer] MRN: [id number] Date of Birth: 05-Dec-1932

## 2017-05-15 NOTE — Telephone Encounter (Signed)
Pt wife called and had a couple questions.  1. How far away does the cell phone need to be away from the home monitor? I informed her at least 1 foot.  2. Can pt use a fit bit? Instructed pt wife to call tech support for further recommendations.  3. Pt had a little bit of blood on his guaze this morning. Is that normal or a sign of infection? After consulting w/ [facility] RN informed pt wife that a little bit of blood is fine, if it start to profusely bleed to call office for recommendations, and it is too early for infection to start.   4. If patient was to go away and stay at a hotel what should patient do with home monitor? If they are going to be gone less than a week they can leave monitor at home. If more than a week patient can take monitor with him and just plug it up when he arrives at his destination.   Pt wife verbalized understanding.

## 2017-05-16 ENCOUNTER — Telehealth: Payer: Self-pay | Admitting: Internal Medicine

## 2017-05-16 NOTE — Telephone Encounter (Signed)
I advised Mrs. Dorsi that she could remove the gauze and clear tegaderm without the steri-strips coming off. She reports the gauze only has a small spot of blood on it and is not saturated. I let her know if she had difficulty she could leave it. She verbalizes understanding. [place]ound [place].

## 2017-05-16 NOTE — Telephone Encounter (Signed)
Pt wife is calling stating she cannot remove the gauze. Please call , pt wife is scared she will remove the steri strips when she removes the gauze.

## 2017-05-20 ENCOUNTER — Ambulatory Visit: Payer: Medicare Other | Admitting: Occupational Therapy

## 2017-05-20 ENCOUNTER — Ambulatory Visit: Payer: Medicare Other

## 2017-05-20 ENCOUNTER — Ambulatory Visit: Payer: Medicare Other | Admitting: Speech Pathology

## 2017-05-20 ENCOUNTER — Encounter: Payer: Self-pay | Admitting: Speech Pathology

## 2017-05-20 DIAGNOSIS — H5347 Heteronymous bilateral field defects: Secondary | ICD-10-CM

## 2017-05-20 DIAGNOSIS — M6281 Muscle weakness (generalized): Secondary | ICD-10-CM | POA: Diagnosis not present

## 2017-05-20 DIAGNOSIS — R4701 Aphasia: Secondary | ICD-10-CM

## 2017-05-20 NOTE — Therapy (Signed)
[place] MAIN [facility] SERVICES [address] [place], [place], [address] Phone: [phone]   Fax:  [phone]  Occupational Therapy Treatment  Patient Details  Name: [name]. MRN: [id number] Date of Birth: March 26, 1933 Referring Provider: Dr. Rea College  Encounter Date: 05/20/2017      OT End of Session - 05/20/17 0925    Visit Number 23   Number of Visits 32   Date for OT Re-Evaluation 08/06/17   Authorization Type medicare G-code 2   OT Start Time 0915   OT Stop Time 1000   OT Time Calculation (min) 45 min   Activity Tolerance Patient tolerated treatment well   Behavior During Therapy [facility] for tasks assessed/performed      Past Medical History:  Diagnosis Date  . BPH (benign prostatic hyperplasia)   . Coronary artery disease   . Hypertension     Past Surgical History:  Procedure Laterality Date  . Intestinal Blockage    . LOOP RECORDER INSERTION N/A 05/14/2017   Procedure: Loop Recorder Insertion;  Surgeon: [name], MD;  Location: [place] [facility];  Service: Cardiovascular;  Laterality: N/A;  . SKIN CANCER EXCISION     x2  . TONSILLECTOMY      There were no vitals filed for this visit.      Subjective Assessment - 05/20/17 0922    Subjective  Pt. reports he is doing well, and had a nice Father's Day.   Patient is accompained by: Family member   Pertinent History see epic.  L CVA, R hemiopsia, Parkinson's, essential tremor   Patient Stated Goals I want to get well and be perfect again. I hate these questions (pt unable to give specific answer)   Currently in Pain? No/denies      OT TREATMENT    Selfcare:  Pt. worked on improving visual scanning, visual compensatory techniques, and visual search strategies for ADL tabletop tasks in his near space. Pt. worked on Film/video editor in 1.5 M. Pt. utilized a horizontal rectilinear search pattern. Pt. requires verbal cues, and increased time to  complete.                            OT Education - 05/20/17 [phone]    Education provided Yes   Education Details visual scanning, visual compensatory strategies.   Person(s) Educated Patient   Methods Explanation   Comprehension Verbalized understanding          OT Short Term Goals - 02/14/17 1457      OT SHORT TERM GOAL #1   Title Pt and wife will be mod I with HEP - 02/21/2017   Time 4   Period Weeks   Status On-going  needs reinforcement of coordination HEP     OT SHORT TERM GOAL #2   Title Pt will be mod I with shower transfers   Status Achieved  met per pt report     OT SHORT TERM GOAL #3   Title Pt will be mod I with bathing   Status Achieved     OT SHORT TERM GOAL #4   Title Pt will complete table top scanning activity (not lanaguage or letter based) with 75% accuracy   Status Achieved  met for copying small peg design with min v.c.     OT SHORT TERM GOAL #5   Title Pt will attend to familiar task in busy environment with no  more than min vc's.     Status Achieved           OT Long Term Goals - 05/13/17 0936      OT LONG TERM GOAL #1   Title Pt and wife will be mod I with ugraded HEP prn - 03/21/2017   Time 12   Period Weeks   Status On-going     OT LONG TERM GOAL #5   Title Pt will require no more than 2 vc's to recall information from therapy session   Time 12   Period Weeks   Status On-going     OT LONG TERM GOAL #6   Title Pt will complete table top scanning activity (not language based) efficiently with 90% accuracy   Time 12   Period Weeks   Status Revised     OT LONG TERM GOAL #7   Title Pt will complete environmental scanning tasks efficiently with 100% accuracy    Time 12   Period Weeks   Status On-going     OT LONG TERM GOAL #8   Title Pt will perform simple divided attention with environmental scanning for improved safety for ambulation/in prep for driving with at least 75% accuracy.   Baseline Pt. will  demonstrate visual compensatory strategies 100% of the time during ADLs, and IADLs.    Time 12   Period Weeks   Status New               Plan - 05/20/17 0932    Clinical Impression Statement Pt. reports that the driving is going well, and that he hasn't had a wreck yet. Pt. continues to require verbal cues and assist for right sided awareness, however requires less verbal cues. Pt. continues to work on improving visual scanning, and visual  search strategies, and visual compenatory strategies for tabletop ADL tasks in the near space.    Occupational performance deficits (Please refer to evaluation for details): ADL's   Rehab Potential Good   Current Impairments/barriers affecting progress: decr awareness/   OT Frequency 2x / week   OT Duration 8 weeks   OT Treatment/Interventions Self-care/ADL training;Electrical Stimulation;Therapeutic exercise;Neuromuscular education;DME and/or AE instruction;Energy conservation;Therapist, nutritional;Therapeutic activities;Cognitive remediation/compensation;Visual/perceptual remediation/compensation;Patient/family education;Balance training   Consulted and Agree with Plan of Care Patient      Patient will benefit from skilled therapeutic intervention in order to improve the following deficits and impairments:  Decreased balance, Decreased cognition, Decreased coordination, Decreased safety awareness, Decreased mobility, Decreased knowledge of use of DME, Impaired UE functional use, Impaired vision/preception  Visit Diagnosis: Hemianopsia    Problem List Patient Active Problem List   Diagnosis Date Noted  . Stenosis of right carotid artery 02/01/2017  . Acute embolic stroke ([place]) 18/98/4210  . Bradycardia 11/10/2015  . Essential tremor 06/03/2015  . Dizziness 08/21/2012  . Syncope 04/10/2011  . CALF PAIN, LEFT 10/11/2010  . DYSPEPSIA 07/31/2010  . CHEST DISCOMFORT 07/31/2010  . Hyperlipidemia 05/16/2010  . HYPERTENSION, BENIGN  05/16/2010  . CAD, NATIVE VESSEL 05/16/2010    Harrel Carina 05/20/2017, 9:40 AM  [place] MAIN [facility] SERVICES [address] [place], [place], [address] Phone: [phone]   Fax:  [phone]  Name: Ashton Belote [facility]. MRN: [id number] Date of Birth: 03-07-1933

## 2017-05-20 NOTE — Therapy (Signed)
[place] MAIN [facility] SERVICES [address] [place], [place], [address] Phone: [phone]   Fax:  [phone]  Speech Language Pathology Treatment  Patient Details  Name: [name]. MRN: [id number] Date of Birth: Dec 08, 1932 Referring Provider: Launa Grill, MD  Encounter Date: 05/20/2017      End of Session - 05/20/17 1254    Visit Number 27   Number of Visits 33   Date for SLP Re-Evaluation 05/27/17   SLP Start Time 1000   SLP Stop Time  1055   SLP Time Calculation (min) 55 min   Activity Tolerance Patient tolerated treatment well      Past Medical History:  Diagnosis Date  . BPH (benign prostatic hyperplasia)   . Coronary artery disease   . Hypertension     Past Surgical History:  Procedure Laterality Date  . Intestinal Blockage    . LOOP RECORDER INSERTION N/A 05/14/2017   Procedure: Loop Recorder Insertion;  Surgeon: [name], MD;  Location: [place] [facility];  Service: Cardiovascular;  Laterality: N/A;  . SKIN CANCER EXCISION     x2  . TONSILLECTOMY      There were no vitals filed for this visit.      Subjective Assessment - 05/20/17 1253    Subjective Patient was frustrated at the start of the session but worked hard. Patient left his homework at home.   Currently in Pain? No/denies               ADULT SLP TREATMENT - 05/20/17 0001      General Information   Behavior/Cognition Alert;Cooperative;Pleasant mood     Treatment Provided   Treatment provided Cognitive-Linquistic     Pain Assessment   Pain Assessment No/denies pain     Cognitive-Linquistic Treatment   Treatment focused on Aphasia   Skilled Treatment Clinician gave the patient a multiple meanings words task, with the goal of the patient coming up with two meanings for each word in a list. Patient was able to define and use in a sentence one meaning for 100% of the words, but was unable to produce a second meaning  even given maximal cueing and further instruction on the task from the clinician. Patient was able to use appropriate content words and adequately explain functional daily skills with 75% accuracy without clinician cueing. Patient explained 100% of the tasks with adequate content when given moderate cueing for more details from the clinician. Patient told a story which the clinician was able to understand and follow with minimal cueing for more details.     Assessment / Recommendations / Plan   Plan Continue with current plan of care     Progression Toward Goals   Progression toward goals Progressing toward goals          SLP Education - 05/20/17 1253    Education provided Yes   Education Details Importance of content words in conversation   Person(s) Educated Patient   Methods Explanation   Comprehension Verbalized understanding          SLP Short Term Goals - 04/01/17 1015      SLP SHORT TERM GOAL #1   Baseline 04/01/17 - avg 8          SLP Long Term Goals - 04/01/17 1233      SLP LONG TERM GOAL #1   Title pt will use compensations for functional 10 minute mod-max complex conversation   Time 5  Period Weeks   Status On-going     SLP LONG TERM GOAL #2   Title  pt will demo [facility] tasks requiring memory compensations (meds, appointments, etc)   Status Achieved     SLP LONG TERM GOAL #3   Title pt will demo functional reading comprehension in 3-4 sentence paragraphs   Status Achieved     SLP LONG TERM GOAL #4   Title Patient will generate grammatical and meaningful sentences to complete moderate cognitive-linguistic task with 80% accruacy.   Time 7   Status Partially Met     SLP LONG TERM GOAL #5   Title Patient will complete high level word finding activities with 80% accuracy.   Time 7   Period Weeks   Status Partially Met          Plan - 05/20/17 1255    Clinical Impression Statement Patient is progressing in his ability to provide adequate content words  in conversations and in functional tasks, but he still requires cueing from the clinician to provide specific details and missing important information. Clinician will continue to use functional tasks to elicit language opportunities and cue the patient to provide more content.   Speech Therapy Frequency 2x / week   Duration Other (comment)   Treatment/Interventions Language facilitation;Cueing hierarchy;SLP instruction and feedback;Cognitive reorganization   Potential to Achieve Goals Good   Potential Considerations Ability to learn/carryover information;Cooperation/participation level;Previous level of function;Severity of impairments   SLP Home Exercise Plan Practice circumlocution and gesturing in conversation   Consulted and Agree with Plan of Care Patient      Patient will benefit from skilled therapeutic intervention in order to improve the following deficits and impairments:   Aphasia    Problem List Patient Active Problem List   Diagnosis Date Noted  . Stenosis of right carotid artery 02/01/2017  . Acute embolic stroke ([place]) 09/98/3382  . Bradycardia 11/10/2015  . Essential tremor 06/03/2015  . Dizziness 08/21/2012  . Syncope 04/10/2011  . CALF PAIN, LEFT 10/11/2010  . DYSPEPSIA 07/31/2010  . CHEST DISCOMFORT 07/31/2010  . Hyperlipidemia 05/16/2010  . HYPERTENSION, BENIGN 05/16/2010  . CAD, NATIVE VESSEL 05/16/2010    Aline August 05/20/2017, 12:58 PM  [place] MAIN [facility] SERVICES [address] [place], [place], [address] Phone: [phone]   Fax:  [phone]   Name: Daksh Coates [facility]. MRN: [id number] Date of Birth: 1933-11-07

## 2017-05-22 ENCOUNTER — Ambulatory Visit: Payer: Medicare Other

## 2017-05-22 ENCOUNTER — Encounter: Payer: Self-pay | Admitting: Speech Pathology

## 2017-05-22 ENCOUNTER — Ambulatory Visit: Payer: Medicare Other | Admitting: Speech Pathology

## 2017-05-22 ENCOUNTER — Telehealth: Payer: Self-pay | Admitting: Cardiovascular Disease

## 2017-05-22 ENCOUNTER — Ambulatory Visit: Payer: Medicare Other | Admitting: Occupational Therapy

## 2017-05-22 DIAGNOSIS — H5347 Heteronymous bilateral field defects: Secondary | ICD-10-CM

## 2017-05-22 DIAGNOSIS — R4701 Aphasia: Secondary | ICD-10-CM

## 2017-05-22 DIAGNOSIS — M6281 Muscle weakness (generalized): Secondary | ICD-10-CM | POA: Diagnosis not present

## 2017-05-22 NOTE — Telephone Encounter (Signed)
Informed patient that the transmission has not been received. I informed him that [facility] tech support and they said that there is a 2-6 hour delay in getting the manual transmissions. I told patient that he could go to the ER if he felt that it was necessary. Patient strongly declined going to the ER. He said that he "did not feel like he was in any danger". He asked me to call once the report is received.

## 2017-05-22 NOTE — Telephone Encounter (Signed)
Patient wants nurse to call for advise  Patent feels lightheaded  HR 45-55

## 2017-05-22 NOTE — Therapy (Signed)
[place] MAIN [facility] SERVICES [address] [place], [place], [address] Phone: [phone]   Fax:  [phone]  Occupational Therapy Treatment  Patient Details  Name: [name]. MRN: [id number] Date of Birth: 07/02/1933 Referring Provider: Dr. Rea College  Encounter Date: 05/22/2017      OT End of Session - 05/22/17 0936    Visit Number 24   Number of Visits 32   Date for OT Re-Evaluation 08/06/17   Authorization Type medicare G-code 3   OT Start Time 0917   OT Stop Time 1000   OT Time Calculation (min) 43 min   Activity Tolerance Patient tolerated treatment well   Behavior During Therapy [facility] for tasks assessed/performed      Past Medical History:  Diagnosis Date  . BPH (benign prostatic hyperplasia)   . Coronary artery disease   . Hypertension     Past Surgical History:  Procedure Laterality Date  . Intestinal Blockage    . LOOP RECORDER INSERTION N/A 05/14/2017   Procedure: Loop Recorder Insertion;  Surgeon: [name], MD;  Location: [place] [facility];  Service: Cardiovascular;  Laterality: N/A;  . SKIN CANCER EXCISION     x2  . TONSILLECTOMY      There were no vitals filed for this visit.      Subjective Assessment - 05/22/17 0932    Subjective  Pt. reports dizziness today.   Patient is accompained by: Family member   Pertinent History see epic.  L CVA, R hemiopsia, Parkinson's, essential tremor   Patient Stated Goals I want to get well and be perfect again. I hate these questions (pt unable to give specific answer)   Currently in Pain? Yes   Pain Score 1    Pain Location Back      OT TREATMENT    Selfcare:  Pt. worked on improving visual scanning, visual compensatory techniques, and visual search strategies for ADL tabletop tasks in his near space. Pt. worked on Film/video editor in 1.5 M. Pt. utilized a horizontal rectilinear search pattern. Pt. requires verbal cues, and increased  time to complete. Pt. Required less time to complete this date.Pt. worked on visual scanning tasks using cards. Pt. Worked on a cognitive component of the task by searching for cards that add, or subtract to a specified number with visual scanning with cards placed at the tabletop randomly. Pt. worked on these visual scanning strategies in preparation for tabletop ADL tasks.                           OT Education - 05/22/17 0933    Education provided Yes   Education Details visual scanning and visual compensation strategies.   Person(s) Educated Patient   Methods Explanation   Comprehension Verbalized understanding          OT Short Term Goals - 02/14/17 1457      OT SHORT TERM GOAL #1   Title Pt and wife will be mod I with HEP - 02/21/2017   Time 4   Period Weeks   Status On-going  needs reinforcement of coordination HEP     OT SHORT TERM GOAL #2   Title Pt will be mod I with shower transfers   Status Achieved  met per pt report     OT SHORT TERM GOAL #3   Title Pt will be mod I with bathing   Status Achieved  OT SHORT TERM GOAL #4   Title Pt will complete table top scanning activity (not lanaguage or letter based) with 75% accuracy   Status Achieved  met for copying small peg design with min v.c.     OT SHORT TERM GOAL #5   Title Pt will attend to familiar task in busy environment with no more than min vc's.     Status Achieved           OT Long Term Goals - 05/13/17 0936      OT LONG TERM GOAL #1   Title Pt and wife will be mod I with ugraded HEP prn - 03/21/2017   Time 12   Period Weeks   Status On-going     OT LONG TERM GOAL #5   Title Pt will require no more than 2 vc's to recall information from therapy session   Time 12   Period Weeks   Status On-going     OT LONG TERM GOAL #6   Title Pt will complete table top scanning activity (not language based) efficiently with 90% accuracy   Time 12   Period Weeks   Status Revised      OT LONG TERM GOAL #7   Title Pt will complete environmental scanning tasks efficiently with 100% accuracy    Time 12   Period Weeks   Status On-going     OT LONG TERM GOAL #8   Title Pt will perform simple divided attention with environmental scanning for improved safety for ambulation/in prep for driving with at least 75% accuracy.   Baseline Pt. will demonstrate visual compensatory strategies 100% of the time during ADLs, and IADLs.    Time 12   Period Weeks   Status New               Plan - 05/22/17 7124    Clinical Impression Statement Pt. reports dizziness upon arrival. Pt. BP: 108/62, HR: 59-61, and SO2: 100%. Pt. continues to work on visual compensatory techniques, visual scanning, and visual search strategies during ADLs, and IADLs.    Occupational performance deficits (Please refer to evaluation for details): ADL's   Rehab Potential Good   OT Frequency 2x / week   OT Treatment/Interventions Self-care/ADL training;Electrical Stimulation;Therapeutic exercise;Neuromuscular education;DME and/or AE instruction;Energy conservation;Therapist, nutritional;Therapeutic activities;Cognitive remediation/compensation;Visual/perceptual remediation/compensation;Patient/family education;Balance training   Consulted and Agree with Plan of Care Patient      Patient will benefit from skilled therapeutic intervention in order to improve the following deficits and impairments:  Decreased balance, Decreased cognition, Decreased coordination, Decreased safety awareness, Decreased mobility, Decreased knowledge of use of DME, Impaired UE functional use, Impaired vision/preception  Visit Diagnosis: Hemianopsia    Problem List Patient Active Problem List   Diagnosis Date Noted  . Stenosis of right carotid artery 02/01/2017  . Acute embolic stroke ([place]) 58/08/9832  . Bradycardia 11/10/2015  . Essential tremor 06/03/2015  . Dizziness 08/21/2012  . Syncope 04/10/2011  . CALF PAIN,  LEFT 10/11/2010  . DYSPEPSIA 07/31/2010  . CHEST DISCOMFORT 07/31/2010  . Hyperlipidemia 05/16/2010  . HYPERTENSION, BENIGN 05/16/2010  . CAD, NATIVE VESSEL 05/16/2010    Harrel Carina, MS, OTR/L 05/22/2017, 9:42 AM  [place] MAIN [facility] SERVICES [address] [place], [place], [address] Phone: [phone]   Fax:  [phone]  Name: Calvin Poupard [facility]. MRN: [id number] Date of Birth: 18-May-1933

## 2017-05-22 NOTE — Telephone Encounter (Signed)
Pt reports lightheadedness and low HR. He recently had a loop recorder inserted. Is there any way you can do a download and have an EP look at it? We [place]'t have any EP docs in [place] today.

## 2017-05-22 NOTE — Telephone Encounter (Signed)
Spoke to patient regarding sx's. Patient states that he's been lightheaded x 1-2 days. He said that his HR has been in the mid to high 50's and BP has been slightly higher than normal. He states that he drinks 64oz of water daily. Patient doesn't sound distressed at present. I asked patient to send a transmission for review. Patient verbalized understanding.

## 2017-05-22 NOTE — Therapy (Signed)
[place] MAIN [facility] SERVICES [address] [place], [place], [address] Phone: [phone]   Fax:  [phone]  Speech Language Pathology Treatment  Patient Details  Name: [name]. MRN: [id number] Date of Birth: [birth year] Referring Provider: Launa Grill, MD  Encounter Date: 05/22/2017      End of Session - 05/22/17 1144    Visit Number 28   Number of Visits 33   Date for SLP Re-Evaluation 05/27/17   SLP Start Time 61   SLP Stop Time  1100   SLP Time Calculation (min) 55 min   Activity Tolerance Patient tolerated treatment well      Past Medical History:  Diagnosis Date  . BPH (benign prostatic hyperplasia)   . Coronary artery disease   . Hypertension     Past Surgical History:  Procedure Laterality Date  . Intestinal Blockage    . LOOP RECORDER INSERTION N/A 05/14/2017   Procedure: Loop Recorder Insertion;  Surgeon: [name], MD;  Location: [place] [facility];  Service: Cardiovascular;  Laterality: N/A;  . SKIN CANCER EXCISION     x2  . TONSILLECTOMY      There were no vitals filed for this visit.      Subjective Assessment - 05/22/17 1143    Subjective Patient mentioned he was a bit dizzy at the start of the session but it did not affect his performance.   Currently in Pain? No/denies               ADULT SLP TREATMENT - 05/22/17 0001      General Information   Behavior/Cognition Alert;Cooperative;Pleasant mood     Treatment Provided   Treatment provided Cognitive-Linquistic     Pain Assessment   Pain Assessment No/denies pain     Cognitive-Linquistic Treatment   Treatment focused on Aphasia   Skilled Treatment Patient was able to use appropriate content words and adequately explain functional daily skills with 70% accuracy without clinician cueing. Patient explained 100% of the tasks with adequate content when given moderate cueing for more details from the clinician. Patient  performed two tasks on identifying synonyms for words. Patient chose the correct word from three options with 90% accuracy, and came up with one synonym for a word with 100% accuracy. Patient was able to match words to their definitions with 85% accuracy.      Assessment / Recommendations / Plan   Plan Continue with current plan of care     Progression Toward Goals   Progression toward goals Progressing toward goals          SLP Education - 05/22/17 1143    Education provided Yes   Education Details Providing adequate detail when explaining tasks   Person(s) Educated Patient   Methods Explanation   Comprehension Verbalized understanding          SLP Short Term Goals - 04/01/17 1015      SLP SHORT TERM GOAL #1   Baseline 04/01/17 - avg 8          SLP Long Term Goals - 04/01/17 1233      SLP LONG TERM GOAL #1   Title pt will use compensations for functional 10 minute mod-max complex conversation   Time 5   Period Weeks   Status On-going     SLP LONG TERM GOAL #2   Title  pt will demo [facility] tasks requiring memory compensations (meds, appointments, etc)   Status Achieved  SLP LONG TERM GOAL #3   Title pt will demo functional reading comprehension in 3-4 sentence paragraphs   Status Achieved     SLP LONG TERM GOAL #4   Title Patient will generate grammatical and meaningful sentences to complete moderate cognitive-linguistic task with 80% accruacy.   Time 7   Status Partially Met     SLP LONG TERM GOAL #5   Title Patient will complete high level word finding activities with 80% accuracy.   Time 7   Period Weeks   Status Partially Met          Plan - 05/22/17 1145    Clinical Impression Statement Patient is progressing in his ability to provide adequate content words in conversations and in functional tasks, but he still requires cueing from the clinician to provide specific details and missing important information. Patient performed well on synonyms and  definitions tasks. Clinician will continue to use functional tasks to elicit language opportunities and cue the patient to provide more content.   Speech Therapy Frequency 2x / week   Duration Other (comment)   Treatment/Interventions Language facilitation;Cueing hierarchy;SLP instruction and feedback;Cognitive reorganization   Potential to Achieve Goals Good   Potential Considerations Ability to learn/carryover information;Cooperation/participation level;Previous level of function;Severity of impairments   SLP Home Exercise Plan Practice circumlocution and gesturing in conversation   Consulted and Agree with Plan of Care Patient      Patient will benefit from skilled therapeutic intervention in order to improve the following deficits and impairments:   Aphasia    Problem List Patient Active Problem List   Diagnosis Date Noted  . Stenosis of right carotid artery 02/01/2017  . Acute embolic stroke ([place]) 62/69/4854  . Bradycardia 11/10/2015  . Essential tremor 06/03/2015  . Dizziness 08/21/2012  . Syncope 04/10/2011  . CALF PAIN, LEFT 10/11/2010  . DYSPEPSIA 07/31/2010  . CHEST DISCOMFORT 07/31/2010  . Hyperlipidemia 05/16/2010  . HYPERTENSION, BENIGN 05/16/2010  . CAD, NATIVE VESSEL 05/16/2010    Aline August 05/22/2017, 11:46 AM  [place] MAIN [facility] SERVICES [address] [place], [place], [address] Phone: [phone]   Fax:  [phone]   Name: [name] [facility]. MRN: [id number] Date of Birth: [birth year]

## 2017-05-23 NOTE — Telephone Encounter (Signed)
No atrial fibrillation on loop monitor download Would continue to monitor blood pressure and heart rate for now

## 2017-05-23 NOTE — Telephone Encounter (Signed)
Pt has appt on Tues for wound check, as well.

## 2017-05-24 ENCOUNTER — Ambulatory Visit: Payer: Medicare Other

## 2017-05-27 ENCOUNTER — Ambulatory Visit: Payer: Medicare Other

## 2017-05-27 ENCOUNTER — Ambulatory Visit: Payer: Medicare Other | Admitting: Speech Pathology

## 2017-05-27 ENCOUNTER — Ambulatory Visit: Payer: Medicare Other | Admitting: Occupational Therapy

## 2017-05-27 ENCOUNTER — Encounter: Payer: Self-pay | Admitting: Speech Pathology

## 2017-05-27 DIAGNOSIS — R4701 Aphasia: Secondary | ICD-10-CM

## 2017-05-27 DIAGNOSIS — R278 Other lack of coordination: Secondary | ICD-10-CM

## 2017-05-27 DIAGNOSIS — M6281 Muscle weakness (generalized): Secondary | ICD-10-CM | POA: Diagnosis not present

## 2017-05-27 NOTE — Therapy (Signed)
[place] MAIN [facility] SERVICES [address] [place], [place], [address] Phone: [phone]   Fax:  [phone]  Speech Language Pathology Treatment  Patient Details  Name: [name]. MRN: [id number] Date of Birth: 09/24/1933 Referring Provider: Launa Grill, MD  Encounter Date: 05/27/2017      End of Session - 05/27/17 1124    Visit Number 29   Number of Visits 33   Date for SLP Re-Evaluation 05/27/17   SLP Start Time 1000   SLP Stop Time  1100   SLP Time Calculation (min) 60 min   Activity Tolerance Patient tolerated treatment well      Past Medical History:  Diagnosis Date  . BPH (benign prostatic hyperplasia)   . Coronary artery disease   . Hypertension     Past Surgical History:  Procedure Laterality Date  . Intestinal Blockage    . LOOP RECORDER INSERTION N/A 05/14/2017   Procedure: Loop Recorder Insertion;  Surgeon: [name], MD;  Location: [place] [facility];  Service: Cardiovascular;  Laterality: N/A;  . SKIN CANCER EXCISION     x2  . TONSILLECTOMY      There were no vitals filed for this visit.      Subjective Assessment - 05/27/17 1123    Subjective Patient worked hard throughout the session.   Currently in Pain? No/denies               ADULT SLP TREATMENT - 05/27/17 0001      General Information   Behavior/Cognition Alert;Cooperative;Pleasant mood     Treatment Provided   Treatment provided Cognitive-Linquistic     Pain Assessment   Pain Assessment No/denies pain     Cognitive-Linquistic Treatment   Treatment focused on Aphasia   Skilled Treatment  Patient matched words to their definitions with 85% accuracy. Patient completed simple sentences with minimal cueing with 87%. Patient matched the first halves of words from a list to their second halves from a list and provided a definition for the created word with 85% accuracy given moderate cueing from the clinician.  Clinician had patient tell a story, and patient provided an effective and understandable narrative about the trip he and his wife took last weekend.     Assessment / Recommendations / Plan   Plan Continue with current plan of care     Progression Toward Goals   Progression toward goals Progressing toward goals          SLP Education - 05/27/17 1124    Education provided Yes   Education Details Perseveration in aphasia   Person(s) Educated Patient   Methods Explanation   Comprehension Verbalized understanding          SLP Short Term Goals - 04/01/17 1015      SLP SHORT TERM GOAL #1   Baseline 04/01/17 - avg 8          SLP Long Term Goals - 04/01/17 1233      SLP LONG TERM GOAL #1   Title pt will use compensations for functional 10 minute mod-max complex conversation   Time 5   Period Weeks   Status On-going     SLP LONG TERM GOAL #2   Title  pt will demo [facility] tasks requiring memory compensations (meds, appointments, etc)   Status Achieved     SLP LONG TERM GOAL #3   Title pt will demo functional reading comprehension in 3-4 sentence paragraphs   Status Achieved  SLP LONG TERM GOAL #4   Title Patient will generate grammatical and meaningful sentences to complete moderate cognitive-linguistic task with 80% accruacy.   Time 7   Status Partially Met     SLP LONG TERM GOAL #5   Title Patient will complete high level word finding activities with 80% accuracy.   Time 7   Period Weeks   Status Partially Met          Plan - 05/27/17 1124    Clinical Impression Statement Patient's ability to participate in conversation and to tell stories that make sense is improving, but he still requires minimal cues from the clinician to provide more content in conversation and moderate cues to provide more content when defining words. Clinician will continue to use functional tasks to elicit language opportunities and cue the patient to provide more content.   Speech Therapy  Frequency 2x / week   Duration Other (comment)   Treatment/Interventions Language facilitation;Cueing hierarchy;SLP instruction and feedback;Cognitive reorganization   Potential to Achieve Goals Good   Potential Considerations Ability to learn/carryover information;Cooperation/participation level;Previous level of function;Severity of impairments   SLP Home Exercise Plan Practice circumlocution and gesturing in conversation   Consulted and Agree with Plan of Care Patient      Patient will benefit from skilled therapeutic intervention in order to improve the following deficits and impairments:   Aphasia    Problem List Patient Active Problem List   Diagnosis Date Noted  . Stenosis of right carotid artery 02/01/2017  . Acute embolic stroke ([place]) 84/13/2440  . Bradycardia 11/10/2015  . Essential tremor 06/03/2015  . Dizziness 08/21/2012  . Syncope 04/10/2011  . CALF PAIN, LEFT 10/11/2010  . DYSPEPSIA 07/31/2010  . CHEST DISCOMFORT 07/31/2010  . Hyperlipidemia 05/16/2010  . HYPERTENSION, BENIGN 05/16/2010  . CAD, NATIVE VESSEL 05/16/2010    Aline August 05/27/2017, 11:25 AM  [place] MAIN [facility] SERVICES [address] [place], [place], [address] Phone: [phone]   Fax:  [phone]   Name: [name] [facility]. MRN: [id number] Date of Birth: 10/12/1933

## 2017-05-27 NOTE — Therapy (Signed)
[place] [facility] MAIN [facility] SERVICES [address] [place], [place], [address] Phone: [phone]   Fax:  [phone]  Occupational Therapy Treatment  Patient Details  Name: [name]. MRN: [id number] Date of Birth: Jul 14, 1933 Referring Provider: Dr. Wilfrid Lund  Encounter Date: 05/27/2017      OT End of Session - 05/27/17 0929    Visit Number 25   Number of Visits 32   Date for OT Re-Evaluation 08/06/17   Authorization Type medicare G-code 4   OT Start Time 0915   OT Stop Time 1000   OT Time Calculation (min) 45 min   Activity Tolerance Patient tolerated treatment well   Behavior During Therapy [facility] for tasks assessed/performed      Past Medical History:  Diagnosis Date  . BPH (benign prostatic hyperplasia)   . Coronary artery disease   . Hypertension     Past Surgical History:  Procedure Laterality Date  . Intestinal Blockage    . LOOP RECORDER INSERTION N/A 05/14/2017   Procedure: Loop Recorder Insertion;  Surgeon: Duke Salvia, MD;  Location: [facility];  Service: Cardiovascular;  Laterality: N/A;  . SKIN CANCER EXCISION     x2  . TONSILLECTOMY      There were no vitals filed for this visit.      Subjective Assessment - 05/27/17 0927    Subjective  Pt. reports he feels like he is doing better.   Patient is accompained by: Family member   Pertinent History see epic.  L CVA, R hemiopsia, Parkinson's, essential tremor   Currently in Pain? No/denies      OT TREATMENT  Selfcare:  Pt. Worked on tabletop visual scanning, and visual search patterns in his near space for tabletop ADL, and IADL tasks. Pt. required less time to complete the telephone number copy with the list placed to the far right to challenge visual scanning, and visual search strategies.  Therapeutic Activities:  Pt. worked on Administrator a PCP pipe following visual pattern instructions. Pt. required increased time and frequent  verbal cues, and visual demonstartion to complete.                           OT Education - 05/27/17 [phone]    Education provided Yes   Education Details visual scanning, and visual compensatory strategies.   Person(s) Educated Patient   Methods Explanation   Comprehension Verbalized understanding             OT Long Term Goals - 05/13/17 0936      OT LONG TERM GOAL #1   Title Pt and wife will be mod I with ugraded HEP prn - 03/21/2017   Time 12   Period Weeks   Status On-going     OT LONG TERM GOAL #5   Title Pt will require no more than 2 vc's to recall information from therapy session   Time 12   Period Weeks   Status On-going     OT LONG TERM GOAL #6   Title Pt will complete table top scanning activity (not language based) efficiently with 90% accuracy   Time 12   Period Weeks   Status Revised     OT LONG TERM GOAL #7   Title Pt will complete environmental scanning tasks efficiently with 100% accuracy    Time 12   Period Weeks   Status On-going     OT LONG TERM  GOAL #8   Title Pt will perform simple divided attention with environmental scanning for improved safety for ambulation/in prep for driving with at least 16%75% accuracy.   Baseline Pt. will demonstrate visual compensatory strategies 100% of the time during ADLs, and IADLs.    Time 12   Period Weeks   Status New               Plan - 05/27/17 0930    Clinical Impression Statement Pt. reports he feels like he is doing more at home now, however not like he used to. Pt. education was provided about  visual comepeatory strategies during ADLs, IADLs. Pt. worked on visual scanning, tasks in his near space at the tabletop in preparation for ADL, and IADL tasks.   Occupational performance deficits (Please refer to evaluation for details): ADL's   Rehab Potential Good   OT Frequency 2x / week   OT Duration 8 weeks   OT Treatment/Interventions Self-care/ADL training;Electrical  Stimulation;Therapeutic exercise;Neuromuscular education;DME and/or AE instruction;Energy conservation;Building services engineerunctional Mobility Training;Therapeutic activities;Cognitive remediation/compensation;Visual/perceptual remediation/compensation;Patient/family education;Balance training   Consulted and Agree with Plan of Care Patient      Patient will benefit from skilled therapeutic intervention in order to improve the following deficits and impairments:  Decreased balance, Decreased cognition, Decreased coordination, Decreased safety awareness, Decreased mobility, Decreased knowledge of use of DME, Impaired UE functional use, Impaired vision/preception  Visit Diagnosis: Muscle weakness (generalized)  Other lack of coordination    Problem List Patient Active Problem List   Diagnosis Date Noted  . Stenosis of right carotid artery 02/01/2017  . Acute embolic stroke ([facility]) 02/01/2017  . Bradycardia 11/10/2015  . Essential tremor 06/03/2015  . Dizziness 08/21/2012  . Syncope 04/10/2011  . CALF PAIN, LEFT 10/11/2010  . DYSPEPSIA 07/31/2010  . CHEST DISCOMFORT 07/31/2010  . Hyperlipidemia 05/16/2010  . HYPERTENSION, BENIGN 05/16/2010  . CAD, NATIVE VESSEL 05/16/2010    Olegario MessierElaine Sadiya Durand, MS, OTR/L 05/27/2017, 9:36 AM  [place] [facility] [address] [place], [place], [address] Phone: [phone]   Fax:  [phone]  Name: Scarlette ShortsGeorge [facility] Jr[facility] MRN: [id number] Date of Birth: 1933/05/20

## 2017-05-28 ENCOUNTER — Ambulatory Visit (INDEPENDENT_AMBULATORY_CARE_PROVIDER_SITE_OTHER): Payer: Self-pay | Admitting: *Deleted

## 2017-05-28 DIAGNOSIS — I639 Cerebral infarction, unspecified: Secondary | ICD-10-CM

## 2017-05-28 LAB — CUP PACEART INCLINIC DEVICE CHECK
Date Time Interrogation Session: 20180626151348
Implantable Pulse Generator Implant Date: 20180612

## 2017-05-28 NOTE — Progress Notes (Signed)
Wound check appointment. Steri-strips removed. Wound without redness or edema. Incision edges approximated, wound well healed. Normal device function. Battery status: good. R-waves 0.434mV. No symptom, tachy, or AF episodes. Enabled pause and brady detection this session as patient reported bradycardia per 05/22/17 phone note. Per histograms, HR is in the 50-59bpm bin 60% of the time, no notable binning below 50bpm. Patient educated about wound care and Carelink monitor. Monthly summary reports and ROV with SK/B in 12 months.

## 2017-05-29 ENCOUNTER — Ambulatory Visit: Payer: Medicare Other | Admitting: Speech Pathology

## 2017-05-29 ENCOUNTER — Ambulatory Visit: Payer: Medicare Other | Admitting: Occupational Therapy

## 2017-05-29 ENCOUNTER — Ambulatory Visit: Payer: Medicare Other

## 2017-05-29 ENCOUNTER — Encounter: Payer: Self-pay | Admitting: Speech Pathology

## 2017-05-29 DIAGNOSIS — R4701 Aphasia: Secondary | ICD-10-CM

## 2017-05-29 DIAGNOSIS — M6281 Muscle weakness (generalized): Secondary | ICD-10-CM | POA: Diagnosis not present

## 2017-05-29 DIAGNOSIS — H5347 Heteronymous bilateral field defects: Secondary | ICD-10-CM

## 2017-05-29 NOTE — Therapy (Signed)
[place] MAIN [facility] SERVICES [address] [place], [place], [address] Phone: [phone]   Fax:  [phone]  Occupational Therapy Treatment  Patient Details  Name: [name]. MRN: [id number] Date of Birth: [birth year] Referring Provider: Dr. Rea College  Encounter Date: 05/29/2017      OT End of Session - 05/29/17 0928    Visit Number 26   Number of Visits 32   Date for OT Re-Evaluation 08/06/17   Authorization Type medicare G-code 5   OT Start Time 0915   OT Stop Time 1000   OT Time Calculation (min) 45 min   Activity Tolerance Patient tolerated treatment well   Behavior During Therapy [facility] for tasks assessed/performed      Past Medical History:  Diagnosis Date  . BPH (benign prostatic hyperplasia)   . Coronary artery disease   . Hypertension     Past Surgical History:  Procedure Laterality Date  . Intestinal Blockage    . LOOP RECORDER INSERTION N/A 05/14/2017   Procedure: Loop Recorder Insertion;  Surgeon: [name], MD;  Location: [place] [facility];  Service: Cardiovascular;  Laterality: N/A;  . SKIN CANCER EXCISION     x2  . TONSILLECTOMY      There were no vitals filed for this visit.      Subjective Assessment - 05/29/17 0924    Subjective  Pt. reports he continues to have difficulty reading the words the words that run across the bottom of the T.V.   Patient is accompained by: Family member   Pertinent History see epic.  L CVA, R hemiopsia, Parkinson's, essential tremor   Patient Stated Goals I want to get well and be perfect again. I hate these questions (pt unable to give specific answer)   Currently in Pain? No/denies        OT TREATMENT    Therapeutic Activities:   Pt. worked on tabletop visual scanning tasks using Film/video editor. Pt. worked on visual scanning for small parquetry pieces randomly scattered on the table. Pt. Worked on visual memory, problem-solving, color  discrimination, shape discrimination, and eye hand integration skills in addition to visual scanning, and visual search strategies. Pt. required increased time to complete, and verbal cues for visual scanning to locate the pieces on the right. Pt. Was and to complete simple designs, and complex designs efficiently with minimal cues for visual scanning for parquetry pieces on the right, and to clarify shape color.                          OT Education - 05/29/17 [phone]    Education provided Yes   Education Details  visual compensatory strategies.   Person(s) Educated Patient   Methods Explanation   Comprehension Verbalized understanding          OT Short Term Goals - 02/14/17 1457      OT SHORT TERM GOAL #1   Title Pt and wife will be mod I with HEP - 02/21/2017   Time 4   Period Weeks   Status On-going  needs reinforcement of coordination HEP     OT SHORT TERM GOAL #2   Title Pt will be mod I with shower transfers   Status Achieved  met per pt report     OT SHORT TERM GOAL #3   Title Pt will be mod I with bathing   Status Achieved     OT  SHORT TERM GOAL #4   Title Pt will complete table top scanning activity (not lanaguage or letter based) with 75% accuracy   Status Achieved  met for copying small peg design with min v.c.     OT SHORT TERM GOAL #5   Title Pt will attend to familiar task in busy environment with no more than min vc's.     Status Achieved           OT Long Term Goals - 05/13/17 0936      OT LONG TERM GOAL #1   Title Pt and wife will be mod I with ugraded HEP prn - 03/21/2017   Time 12   Period Weeks   Status On-going     OT LONG TERM GOAL #5   Title Pt will require no more than 2 vc's to recall information from therapy session   Time 12   Period Weeks   Status On-going     OT LONG TERM GOAL #6   Title Pt will complete table top scanning activity (not language based) efficiently with 90% accuracy   Time 12   Period Weeks    Status Revised     OT LONG TERM GOAL #7   Title Pt will complete environmental scanning tasks efficiently with 100% accuracy    Time 12   Period Weeks   Status On-going     OT LONG TERM GOAL #8   Title Pt will perform simple divided attention with environmental scanning for improved safety for ambulation/in prep for driving with at least 75% accuracy.   Baseline Pt. will demonstrate visual compensatory strategies 100% of the time during ADLs, and IADLs.    Time 12   Period Weeks   Status New               Plan - 05/29/17 0929    Clinical Impression Statement Pt. is making progress overall. Pt. continues to require verbal cues, and assist for visual scanning, and visual search strategies in his near space for tabletop ADL, and IADL tasks.        Patient will benefit from skilled therapeutic intervention in order to improve the following deficits and impairments:     Visit Diagnosis: Hemianopsia    Problem List Patient Active Problem List   Diagnosis Date Noted  . Stenosis of right carotid artery 02/01/2017  . Acute embolic stroke ([place]) 13/07/6577  . Bradycardia 11/10/2015  . Essential tremor 06/03/2015  . Dizziness 08/21/2012  . Syncope 04/10/2011  . CALF PAIN, LEFT 10/11/2010  . DYSPEPSIA 07/31/2010  . CHEST DISCOMFORT 07/31/2010  . Hyperlipidemia 05/16/2010  . HYPERTENSION, BENIGN 05/16/2010  . CAD, NATIVE VESSEL 05/16/2010    Harrel Carina, MS, OTR/L 05/29/2017, 9:35 AM  [place] MAIN [facility] SERVICES [address] [place], [place], [address] Phone: [phone]   Fax:  [phone]  Name: [name] [facility]. MRN: [id number] Date of Birth: Jul 21, 1933

## 2017-05-29 NOTE — Therapy (Signed)
[place] MAIN [facility] SERVICES [address] [place], [place], [address] Phone: [phone]   Fax:  [phone]  Speech Language Pathology Treatment  Patient Details  Name: [name]. MRN: [id number] Date of Birth: May 03, 1933 Referring Provider: Launa Grill, MD  Encounter Date: 05/29/2017      End of Session - 05/29/17 1709    Visit Number 30   Number of Visits 26   Date for SLP Re-Evaluation 07/29/17      Past Medical History:  Diagnosis Date  . BPH (benign prostatic hyperplasia)   . Coronary artery disease   . Hypertension     Past Surgical History:  Procedure Laterality Date  . Intestinal Blockage    . LOOP RECORDER INSERTION N/A 05/14/2017   Procedure: Loop Recorder Insertion;  Surgeon: [name], MD;  Location: [place] [facility];  Service: Cardiovascular;  Laterality: N/A;  . SKIN CANCER EXCISION     x2  . TONSILLECTOMY      There were no vitals filed for this visit.      Subjective Assessment - 05/29/17 1142    Subjective Patient worked hard throughout the session.   Currently in Pain? No/denies               ADULT SLP TREATMENT - 05/29/17 0001      General Information   Behavior/Cognition Alert;Cooperative;Pleasant mood     Treatment Provided   Treatment provided Cognitive-Linquistic     Pain Assessment   Pain Assessment No/denies pain     Cognitive-Linquistic Treatment   Treatment focused on Aphasia   Skilled Treatment Patient generated one synonym for words with 100% accuracy, and was able to generate two synonyms with 80% accuracy. Clinician gave patient minimal phonemic and semantic cues when patient was experiencing word-finding difficulties. Patient chose the appropriate antonym for a word given multiple choices with 88% accuracy. Patient completed common phrases with 70% accuracy given moderate semantic and phonemic cues from the clinician. Clinician had patient tell a  story, and patient provided an effective and understandable narrative about the upcoming trip his family will be taking and about prior trips they have taken.     Assessment / Recommendations / Plan   Plan Continue with current plan of care     Progression Toward Goals   Progression toward goals Progressing toward goals          SLP Education - 05/29/17 1142    Education provided Yes   Education Details Gesturing when having word-finding difficulties   Person(s) Educated Patient   Methods Explanation   Comprehension Verbalized understanding          SLP Short Term Goals - 04/01/17 1015      SLP SHORT TERM GOAL #1   Baseline 04/01/17 - avg 8          SLP Long Term Goals - 05/29/17 1144      SLP LONG TERM GOAL #1   Title pt will use compensations for functional 10 minute mod-max complex conversation   Time 5   Period Weeks   Status Partially Met     SLP LONG TERM GOAL #2   Title  pt will demo [facility] tasks requiring memory compensations (meds, appointments, etc)   Time 6   Period Weeks   Status Achieved     SLP LONG TERM GOAL #3   Title pt will demo functional reading comprehension in 3-4 sentence paragraphs   Time 6  Period Weeks   Status Achieved     SLP LONG TERM GOAL #4   Title Patient will generate grammatical and meaningful sentences to complete moderate cognitive-linguistic task with 80% accruacy.   Time 7   Period Weeks   Status Partially Met     SLP LONG TERM GOAL #5   Title Patient will complete high level word finding activities with 80% accuracy.   Time 7   Period Weeks   Status Partially Met          Plan - 05/29/17 1710    Clinical Impression Statement Patient's conversational abilities are continuing to improve. He uses appropriate content words and his narratives are easy to follow. Patient is able to use gesturing and circumlocution to convey meaning when he is having word-finding difficulties, though he is experiencing these  difficulties less frequently in therapy. Patient is able to generate synonyms for words and provides sufficient content while performing functional tasks, such as completing sentences, giving definitions, describing and explaining daily skills, and completing math word problems. Patient has shown great improvement and is able to functionally communicate with others with little difficulty. Patient will continue with speech therapy because he still has room to improve in his functional communication, although he judges himself as experiencing greater difficulties than the clinician does. Clinician will focus on functional verbal communication and compensatory strategies for his current cognitive-communication deficits.      Patient will benefit from skilled therapeutic intervention in order to improve the following deficits and impairments:   Aphasia    Problem List Patient Active Problem List   Diagnosis Date Noted  . Stenosis of right carotid artery 02/01/2017  . Acute embolic stroke ([place]) 52/17/4715  . Bradycardia 11/10/2015  . Essential tremor 06/03/2015  . Dizziness 08/21/2012  . Syncope 04/10/2011  . CALF PAIN, LEFT 10/11/2010  . DYSPEPSIA 07/31/2010  . CHEST DISCOMFORT 07/31/2010  . Hyperlipidemia 05/16/2010  . HYPERTENSION, BENIGN 05/16/2010  . CAD, NATIVE VESSEL 05/16/2010    Aline August 05/29/2017, 5:15 PM  [place] MAIN [facility] SERVICES [address] [place], [place], [address] Phone: [phone]   Fax:  [phone]   Name: [name]. MRN: [id number] Date of Birth: 23-Sep-1933

## 2017-06-03 ENCOUNTER — Ambulatory Visit: Payer: Medicare Other | Admitting: Speech Pathology

## 2017-06-03 ENCOUNTER — Encounter: Payer: Self-pay | Admitting: Speech Pathology

## 2017-06-03 ENCOUNTER — Ambulatory Visit: Payer: Medicare Other | Attending: Internal Medicine | Admitting: Occupational Therapy

## 2017-06-03 DIAGNOSIS — R4701 Aphasia: Secondary | ICD-10-CM | POA: Diagnosis present

## 2017-06-03 DIAGNOSIS — R41841 Cognitive communication deficit: Secondary | ICD-10-CM | POA: Insufficient documentation

## 2017-06-03 DIAGNOSIS — H5347 Heteronymous bilateral field defects: Secondary | ICD-10-CM | POA: Diagnosis not present

## 2017-06-03 NOTE — Therapy (Signed)
[place] MAIN [facility] SERVICES [address] [place], [place], [address] Phone: [phone]   Fax:  [phone]  Occupational Therapy Treatment  Patient Details  Name: [name]. MRN: [id number] Date of Birth: Mar 15, 1933 Referring Provider: Dr. Rea College  Encounter Date: 06/03/2017      OT End of Session - 06/03/17 0936    Visit Number 27   Number of Visits 32   Date for OT Re-Evaluation 08/06/17   Authorization Type medicare G-code 6   OT Start Time 0915   OT Stop Time 1000   OT Time Calculation (min) 45 min   Activity Tolerance Patient tolerated treatment well   Behavior During Therapy [facility] for tasks assessed/performed      Past Medical History:  Diagnosis Date  . BPH (benign prostatic hyperplasia)   . Coronary artery disease   . Hypertension     Past Surgical History:  Procedure Laterality Date  . Intestinal Blockage    . LOOP RECORDER INSERTION N/A 05/14/2017   Procedure: Loop Recorder Insertion;  Surgeon: [name], MD;  Location: [place] [facility];  Service: Cardiovascular;  Laterality: N/A;  . SKIN CANCER EXCISION     x2  . TONSILLECTOMY      There were no vitals filed for this visit.      Subjective Assessment - 06/03/17 0934    Subjective  Pt. reports he is having trouble remembering things.   Patient is accompained by: Family member   Patient Stated Goals I want to get well and be perfect again. I hate these questions (pt unable to give specific answer)   Currently in Pain? No/denies       OT TREATMENT    Selfcare:  Pt. Worked on visual scanning activities in his extrapersonal space in preparation for IADL tasks within his environment. Pt. worked on a scancourse with three misidentifications on the right.  Pt. worked on visual scanning in the IADL kitchen with consistent cues for items on the right. Pt. worked on tabletop tasks including word searches, and visual saccades left to right.  Pt. Required increased time to complete the word search in 49M, and was unable to finish. Pt. utilized a horizontal rectilinear visual scanning pattern.                         OT Education - 06/03/17 0935    Education provided Yes   Education Details visual scanning, visual compensatory strategies during ADLs, and IADLs.   Person(s) Educated Patient   Methods Explanation   Comprehension Verbalized understanding          OT Short Term Goals - 02/14/17 1457      OT SHORT TERM GOAL #1   Title Pt and wife will be mod I with HEP - 02/21/2017   Time 4   Period Weeks   Status On-going  needs reinforcement of coordination HEP     OT SHORT TERM GOAL #2   Title Pt will be mod I with shower transfers   Status Achieved  met per pt report     OT SHORT TERM GOAL #3   Title Pt will be mod I with bathing   Status Achieved     OT SHORT TERM GOAL #4   Title Pt will complete table top scanning activity (not lanaguage or letter based) with 75% accuracy   Status Achieved  met for copying small peg design with min v.c.  OT SHORT TERM GOAL #5   Title Pt will attend to familiar task in busy environment with no more than min vc's.     Status Achieved           OT Long Term Goals - 05/13/17 0936      OT LONG TERM GOAL #1   Title Pt and wife will be mod I with ugraded HEP prn - 03/21/2017   Time 12   Period Weeks   Status On-going     OT LONG TERM GOAL #5   Title Pt will require no more than 2 vc's to recall information from therapy session   Time 12   Period Weeks   Status On-going     OT LONG TERM GOAL #6   Title Pt will complete table top scanning activity (not language based) efficiently with 90% accuracy   Time 12   Period Weeks   Status Revised     OT LONG TERM GOAL #7   Title Pt will complete environmental scanning tasks efficiently with 100% accuracy    Time 12   Period Weeks   Status On-going     OT LONG TERM GOAL #8   Title Pt will perform  simple divided attention with environmental scanning for improved safety for ambulation/in prep for driving with at least 75% accuracy.   Baseline Pt. will demonstrate visual compensatory strategies 100% of the time during ADLs, and IADLs.    Time 12   Period Weeks   Status New               Plan - 06/03/17 4696    Clinical Impression Statement Pt. continues to make progress. Pt. continues to work on improving visual scanning in his extrapersonal space using a scancourse, and in the IADL kitchen. Pt. required increased time and cues to complete. Pt. presented with multiple misidentifications on the right. Pt. also worked on visual scanning, and visual saccades for ADL tabletop tasks in his near space. Pt. conitnues to requires verbal cues, and assist for tasks on his right.   Occupational performance deficits (Please refer to evaluation for details): ADL's   Rehab Potential Good   Current Impairments/barriers affecting progress: decr awareness   OT Frequency 2x / week   OT Duration 8 weeks   OT Treatment/Interventions Self-care/ADL training;Electrical Stimulation;Therapeutic exercise;Neuromuscular education;DME and/or AE instruction;Energy conservation;Therapist, nutritional;Therapeutic activities;Cognitive remediation/compensation;Visual/perceptual remediation/compensation;Patient/family education;Balance training   Consulted and Agree with Plan of Care Patient      Patient will benefit from skilled therapeutic intervention in order to improve the following deficits and impairments:  Decreased balance, Decreased cognition, Decreased coordination, Decreased safety awareness, Decreased mobility, Decreased knowledge of use of DME, Impaired UE functional use, Impaired vision/preception  Visit Diagnosis: Hemianopsia    Problem List Patient Active Problem List   Diagnosis Date Noted  . Stenosis of right carotid artery 02/01/2017  . Acute embolic stroke ([place]) 29/52/8413  .  Bradycardia 11/10/2015  . Essential tremor 06/03/2015  . Dizziness 08/21/2012  . Syncope 04/10/2011  . CALF PAIN, LEFT 10/11/2010  . DYSPEPSIA 07/31/2010  . CHEST DISCOMFORT 07/31/2010  . Hyperlipidemia 05/16/2010  . HYPERTENSION, BENIGN 05/16/2010  . CAD, NATIVE VESSEL 05/16/2010    Harrel Carina, MS, OTR/L 06/03/2017, 9:51 AM  [place] MAIN [facility] SERVICES [address] [place], [place], [address] Phone: [phone]   Fax:  [phone]  Name: Calvin Spittler [facility]. MRN: [id number] Date of Birth: 23-Dec-1932

## 2017-06-03 NOTE — Therapy (Signed)
[place] MAIN [facility] SERVICES [address] [place], [place], [address] Phone: [phone]   Fax:  [phone]  Speech Language Pathology Treatment  Patient Details  Name: [name]. MRN: [id number] Date of Birth: [birth year] Referring Provider: Launa Grill, MD  Encounter Date: 06/03/2017      End of Session - 06/03/17 1332    Visit Number 31   Number of Visits 45   Date for SLP Re-Evaluation 07/29/17   SLP Start Time 1000   SLP Stop Time  1100   SLP Time Calculation (min) 60 min   Activity Tolerance Patient tolerated treatment well      Past Medical History:  Diagnosis Date  . BPH (benign prostatic hyperplasia)   . Coronary artery disease   . Hypertension     Past Surgical History:  Procedure Laterality Date  . Intestinal Blockage    . LOOP RECORDER INSERTION N/A 05/14/2017   Procedure: Loop Recorder Insertion;  Surgeon: [name], MD;  Location: [place] [facility];  Service: Cardiovascular;  Laterality: N/A;  . SKIN CANCER EXCISION     x2  . TONSILLECTOMY      There were no vitals filed for this visit.      Subjective Assessment - 06/03/17 1328    Subjective Patient mentioned that he had trouble getting to the correct page to sing a hymn at church on Sunday.   Currently in Pain? No/denies               ADULT SLP TREATMENT - 06/03/17 0001      General Information   Behavior/Cognition Alert;Cooperative;Pleasant mood     Treatment Provided   Treatment provided Cognitive-Linquistic     Pain Assessment   Pain Assessment No/denies pain     Cognitive-Linquistic Treatment   Treatment focused on Aphasia   Skilled Treatment The patient completed word problems with minimal assistance from the clinician with 80% accuracy. The clinician elicited language and communication opportunities by asking the patient to explain how he was coming to the correct answer. Patient completed greater than/less  than word problems with 95% accuracy. Patient determined hypothetical cause/effect relationships with 80% accuracy. Patient read large number addition and subtraction problems out loud with 75% accuracy, and substituted at least one incorrect number for another in 25% of the problems. Patient read instructions and problems out loud for each of the activities with 80% semantic accuracy. The patient required skilled cueing and scaffolding from the clinician to explain the process he was using to solve the word problems.      Assessment / Recommendations / Plan   Plan Continue with current plan of care     Progression Toward Goals   Progression toward goals Progressing toward goals          SLP Education - 06/03/17 1328    Education provided Yes   Education Details More content words in explanations   Person(s) Educated Patient   Methods Explanation   Comprehension Verbalized understanding          SLP Short Term Goals - 04/01/17 1015      SLP SHORT TERM GOAL #1   Baseline 04/01/17 - avg 8          SLP Long Term Goals - 05/29/17 1144      SLP LONG TERM GOAL #1   Title pt will use compensations for functional 10 minute mod-max complex conversation   Time 5   Period Weeks  Status Partially Met     SLP LONG TERM GOAL #2   Title  pt will demo [facility] tasks requiring memory compensations (meds, appointments, etc)   Time 6   Period Weeks   Status Achieved     SLP LONG TERM GOAL #3   Title pt will demo functional reading comprehension in 3-4 sentence paragraphs   Time 6   Period Weeks   Status Achieved     SLP LONG TERM GOAL #4   Title Patient will generate grammatical and meaningful sentences to complete moderate cognitive-linguistic task with 80% accruacy.   Time 7   Period Weeks   Status Partially Met     SLP LONG TERM GOAL #5   Title Patient will complete high level word finding activities with 80% accuracy.   Time 7   Period Weeks   Status Partially Met           Plan - 06/03/17 1332    Clinical Impression Statement The patient showed an improvement in the time it took him to complete the word problems during the session and was able to explain his thought process better when given skilled cues from the clinician. Patient continues to substitute incorrect words and numbers while reading out loud. In future sessions, the clinician will continue to elicit language opportunities through word problems and reading numbers out loud.   Speech Therapy Frequency 2x / week   Duration Other (comment)   Treatment/Interventions Language facilitation;Cueing hierarchy;SLP instruction and feedback;Cognitive reorganization   Potential to Achieve Goals Good   Potential Considerations Ability to learn/carryover information;Cooperation/participation level;Previous level of function;Severity of impairments   SLP Home Exercise Plan Practice using calculator at home   Consulted and Agree with Plan of Care Patient      Patient will benefit from skilled therapeutic intervention in order to improve the following deficits and impairments:   Aphasia    Problem List Patient Active Problem List   Diagnosis Date Noted  . Stenosis of right carotid artery 02/01/2017  . Acute embolic stroke ([place]) 76/28/3151  . Bradycardia 11/10/2015  . Essential tremor 06/03/2015  . Dizziness 08/21/2012  . Syncope 04/10/2011  . CALF PAIN, LEFT 10/11/2010  . DYSPEPSIA 07/31/2010  . CHEST DISCOMFORT 07/31/2010  . Hyperlipidemia 05/16/2010  . HYPERTENSION, BENIGN 05/16/2010  . CAD, NATIVE VESSEL 05/16/2010    Aline August 06/03/2017, 1:33 PM  [place] MAIN [facility] SERVICES [address] [place], [place], [address] Phone: [phone]   Fax:  [phone]   Name: [name] [facility]. MRN: [id number] Date of Birth: 1933/06/15

## 2017-06-06 ENCOUNTER — Ambulatory Visit: Payer: Medicare Other | Admitting: Occupational Therapy

## 2017-06-06 DIAGNOSIS — H5347 Heteronymous bilateral field defects: Secondary | ICD-10-CM

## 2017-06-06 NOTE — Therapy (Signed)
[place] [facility] MAIN [facility] SERVICES [address] [place], [place], [address] Phone: [phone]   Fax:  [phone]  Occupational Therapy Treatment  Patient Details  Name: [name]. MRN: [id number] Date of Birth: 05-16-1933 Referring Provider: Dr. Wilfrid Lund  Encounter Date: 06/06/2017      OT End of Session - 06/06/17 0926    Visit Number 28   Number of Visits 32   Date for OT Re-Evaluation 08/06/17   Authorization Type medicare G-code 7   OT Start Time 0915   OT Stop Time 1000   OT Time Calculation (min) 45 min   Activity Tolerance Patient tolerated treatment well   Behavior During Therapy [facility] for tasks assessed/performed      Past Medical History:  Diagnosis Date  . BPH (benign prostatic hyperplasia)   . Coronary artery disease   . Hypertension     Past Surgical History:  Procedure Laterality Date  . Intestinal Blockage    . LOOP RECORDER INSERTION N/A 05/14/2017   Procedure: Loop Recorder Insertion;  Surgeon: Duke Salvia, MD;  Location: [facility];  Service: Cardiovascular;  Laterality: N/A;  . SKIN CANCER EXCISION     x2  . TONSILLECTOMY      There were no vitals filed for this visit.      Subjective Assessment - 06/06/17 0924    Subjective  Pt. reports his driving is going well.   Pertinent History see epic.  L CVA, R hemiopsia, Parkinson's, essential tremor   Currently in Pain? No/denies      OT TREATMENT    Selfcare:  Pt. Worked on visual scanning tasks in his extrapersonal space using a scancourse. Pt. was able to identify all items at a steady pace. Pt. missed three on the right when the pace the increased. Pt. worked on scanning, and visual search strategies in his near space using horizontal rectilinear visual search strategies.  Pt. worked on widening the visual scanning, and search further to the right. Increased time was required for tabletop tasks. Pt. Had 6  omissions.                            OT Education - 06/06/17 0925    Education provided Yes   Education Details Visual scanning, and visual compensatory strategies.   Person(s) Educated Patient   Methods Explanation   Comprehension Verbalized understanding            OT Long Term Goals - 05/13/17 0936      OT LONG TERM GOAL #1   Title Pt and wife will be mod I with ugraded HEP prn - 03/21/2017   Time 12   Period Weeks   Status On-going     OT LONG TERM GOAL #5   Title Pt will require no more than 2 vc's to recall information from therapy session   Time 12   Period Weeks   Status On-going     OT LONG TERM GOAL #6   Title Pt will complete table top scanning activity (not language based) efficiently with 90% accuracy   Time 12   Period Weeks   Status Revised     OT LONG TERM GOAL #7   Title Pt will complete environmental scanning tasks efficiently with 100% accuracy    Time 12   Period Weeks   Status On-going     OT LONG TERM GOAL #8  Title Pt will perform simple divided attention with environmental scanning for improved safety for ambulation/in prep for driving with at least 84%75% accuracy.   Baseline Pt. will demonstrate visual compensatory strategies 100% of the time during ADLs, and IADLs.    Time 12   Period Weeks   Status New               Plan - 06/06/17 [id number]    Clinical Impression Statement Pt. is making progress overall. Pt. continues to drive himself to his therapy sessions. Pt. reports having difficulty keeping up with reading the script along the bottom of the TV. Pt. reports that reading books, magazines, and newspapers is slow. Pt. reports he often has to read the script several times to get it. Pt. continues to work on improving visual scanning, and visual search strategies in his extrapersonal space for IADL tasks navigating in his environment, and near space for tabletop ADL tasks. Pt. continues to work on  visual  compensatory strategies. during ADLs, and IADL tasks.    Occupational performance deficits (Please refer to evaluation for details): ADL's;IADL's   Rehab Potential Good   Current Impairments/barriers affecting progress: decr awareness   OT Frequency 2x / week   OT Duration 8 weeks   OT Treatment/Interventions Self-care/ADL training;Electrical Stimulation;Therapeutic exercise;Neuromuscular education;DME and/or AE instruction;Energy conservation;Building services engineerunctional Mobility Training;Therapeutic activities;Cognitive remediation/compensation;Visual/perceptual remediation/compensation;Patient/family education;Balance training   Consulted and Agree with Plan of Care Patient      Patient will benefit from skilled therapeutic intervention in order to improve the following deficits and impairments:  Decreased balance, Decreased cognition, Decreased coordination, Decreased safety awareness, Decreased mobility, Decreased knowledge of use of DME, Impaired UE functional use, Impaired vision/preception  Visit Diagnosis: Hemianopsia    Problem List Patient Active Problem List   Diagnosis Date Noted  . Stenosis of right carotid artery 02/01/2017  . Acute embolic stroke ([facility]) 02/01/2017  . Bradycardia 11/10/2015  . Essential tremor 06/03/2015  . Dizziness 08/21/2012  . Syncope 04/10/2011  . CALF PAIN, LEFT 10/11/2010  . DYSPEPSIA 07/31/2010  . CHEST DISCOMFORT 07/31/2010  . Hyperlipidemia 05/16/2010  . HYPERTENSION, BENIGN 05/16/2010  . CAD, NATIVE VESSEL 05/16/2010    Olegario MessierElaine Selen Smucker, MS, OTR/L 06/06/2017, 9:54 AM  [place] [facility] [address] [place], [place], [address] Phone: [phone]   Fax:  [phone]  Name: Calvin ShortsGeorge [facility] MRN: [id number] Date of Birth: [birth year]

## 2017-06-10 ENCOUNTER — Encounter: Payer: Medicare Other | Admitting: Speech Pathology

## 2017-06-10 ENCOUNTER — Encounter: Payer: Medicare Other | Admitting: Occupational Therapy

## 2017-06-12 ENCOUNTER — Encounter: Payer: Medicare Other | Admitting: Speech Pathology

## 2017-06-12 ENCOUNTER — Encounter: Payer: Medicare Other | Admitting: Occupational Therapy

## 2017-06-13 ENCOUNTER — Ambulatory Visit (INDEPENDENT_AMBULATORY_CARE_PROVIDER_SITE_OTHER): Payer: Medicare Other | Admitting: *Deleted

## 2017-06-13 DIAGNOSIS — I639 Cerebral infarction, unspecified: Secondary | ICD-10-CM

## 2017-06-17 ENCOUNTER — Encounter: Payer: Medicare Other | Admitting: Occupational Therapy

## 2017-06-17 ENCOUNTER — Encounter: Payer: Medicare Other | Admitting: Speech Pathology

## 2017-06-17 NOTE — Progress Notes (Signed)
[facility] Summary Report / Loop Recorder 

## 2017-06-19 ENCOUNTER — Encounter: Payer: Medicare Other | Admitting: Occupational Therapy

## 2017-06-19 ENCOUNTER — Encounter: Payer: Medicare Other | Admitting: Speech Pathology

## 2017-06-20 LAB — CUP PACEART REMOTE DEVICE CHECK
Date Time Interrogation Session: 20180712174144
Implantable Pulse Generator Implant Date: 20180612

## 2017-06-24 ENCOUNTER — Encounter: Payer: Self-pay | Admitting: Speech Pathology

## 2017-06-24 ENCOUNTER — Ambulatory Visit: Payer: Medicare Other | Admitting: Speech Pathology

## 2017-06-24 ENCOUNTER — Ambulatory Visit: Payer: Medicare Other | Admitting: Occupational Therapy

## 2017-06-24 DIAGNOSIS — H5347 Heteronymous bilateral field defects: Secondary | ICD-10-CM

## 2017-06-24 DIAGNOSIS — R4701 Aphasia: Secondary | ICD-10-CM

## 2017-06-24 NOTE — Therapy (Signed)
[place] MAIN [facility] SERVICES [address] [place], [place], [address] Phone: [phone]   Fax:  [phone]  Speech Language Pathology Treatment  Patient Details  Name: [name]. MRN: [id number] Date of Birth: Apr 17, 1933 Referring Provider: Launa Grill, MD  Encounter Date: 06/24/2017      End of Session - 06/24/17 1231    Visit Number 32   Number of Visits 45   Date for SLP Re-Evaluation 07/29/17   SLP Start Time 78   SLP Stop Time  1200   SLP Time Calculation (min) 60 min   Activity Tolerance Patient tolerated treatment well      Past Medical History:  Diagnosis Date  . BPH (benign prostatic hyperplasia)   . Coronary artery disease   . Hypertension     Past Surgical History:  Procedure Laterality Date  . Intestinal Blockage    . LOOP RECORDER INSERTION N/A 05/14/2017   Procedure: Loop Recorder Insertion;  Surgeon: [name], MD;  Location: [place] [facility];  Service: Cardiovascular;  Laterality: N/A;  . SKIN CANCER EXCISION     x2  . TONSILLECTOMY      There were no vitals filed for this visit.      Subjective Assessment - 06/24/17 1231    Subjective "I'm having trouble remembering my words."    Currently in Pain? No/denies               ADULT SLP TREATMENT - 06/24/17 0001      General Information   Behavior/Cognition Alert;Cooperative;Pleasant mood     Treatment Provided   Treatment provided Cognitive-Linquistic     Pain Assessment   Pain Assessment No/denies pain     Cognitive-Linquistic Treatment   Treatment focused on Aphasia   Skilled Treatment Patient spent the first few minutes of the session telling the clinician about the vacation he went on. Patient experienced some word-finding difficulties during the conversation but provided effective circumlocution and gesturing to get his point across. Patient used a calculator to complete complex subtraction problems with  65% accuracy. Patient's errors were due to reading errors and pushing incorrect buttons on the calculator. Clinician provided moderate instruction and cueing during the activity. Patient determined hypothetical cause/effect relationships with 85% accuracy. Patient read a short story out loud and answered questions about it with 70% accuracy.      Assessment / Recommendations / Plan   Plan Continue with current plan of care     Progression Toward Goals   Progression toward goals Progressing toward goals          SLP Education - 06/24/17 1231    Education provided Yes   Education Details Using a calculator for math problems   Person(s) Educated Patient   Methods Explanation;Demonstration   Comprehension Verbalized understanding;Returned demonstration          SLP Short Term Goals - 04/01/17 1015      SLP SHORT TERM GOAL #1   Baseline 04/01/17 - avg 8          SLP Long Term Goals - 05/29/17 1144      SLP LONG TERM GOAL #1   Title pt will use compensations for functional 10 minute mod-max complex conversation   Time 5   Period Weeks   Status Partially Met     SLP LONG TERM GOAL #2   Title  pt will demo [facility] tasks requiring memory compensations (meds, appointments, etc)   Time 6  Period Weeks   Status Achieved     SLP LONG TERM GOAL #3   Title pt will demo functional reading comprehension in 3-4 sentence paragraphs   Time 6   Period Weeks   Status Achieved     SLP LONG TERM GOAL #4   Title Patient will generate grammatical and meaningful sentences to complete moderate cognitive-linguistic task with 80% accruacy.   Time 7   Period Weeks   Status Partially Met     SLP LONG TERM GOAL #5   Title Patient will complete high level word finding activities with 80% accuracy.   Time 7   Period Weeks   Status Partially Met          Plan - 06/24/17 1232    Clinical Impression Statement Patient is producing fewer word errors while reading task instructions during the  session and is catching himself more often when he does make them. Patient continues to effectively use circumlocution and gesturing when he experiences word-finding difficulties. Clinician will continue to elicit language opportunities and incorporate math problems in the sessions.   Speech Therapy Frequency 2x / week   Duration Other (comment)   Treatment/Interventions Language facilitation;Cueing hierarchy;SLP instruction and feedback;Cognitive reorganization   Potential to Achieve Goals Good   Potential Considerations Ability to learn/carryover information;Cooperation/participation level;Previous level of function;Severity of impairments   SLP Home Exercise Plan Practice using calculator at home   Consulted and Agree with Plan of Care Patient      Patient will benefit from skilled therapeutic intervention in order to improve the following deficits and impairments:   Aphasia    Problem List Patient Active Problem List   Diagnosis Date Noted  . Stenosis of right carotid artery 02/01/2017  . Acute embolic stroke ([place]) 63/94/3200  . Bradycardia 11/10/2015  . Essential tremor 06/03/2015  . Dizziness 08/21/2012  . Syncope 04/10/2011  . CALF PAIN, LEFT 10/11/2010  . DYSPEPSIA 07/31/2010  . CHEST DISCOMFORT 07/31/2010  . Hyperlipidemia 05/16/2010  . HYPERTENSION, BENIGN 05/16/2010  . CAD, NATIVE VESSEL 05/16/2010    Aline August 06/24/2017, 12:32 PM  [place] MAIN [facility] SERVICES [address] [place], [place], [address] Phone: [phone]   Fax:  [phone]   Name: [name] [facility]. MRN: [id number] Date of Birth: [birth year]

## 2017-06-24 NOTE — Therapy (Signed)
[place] MAIN [facility] SERVICES [address] [place], [place], [address] Phone: [phone]   Fax:  [phone]  Occupational Therapy Treatment  Patient Details  Name: [name]. MRN: [id number] Date of Birth: 12-10-1932 Referring Provider: Dr. Rea College  Encounter Date: 06/24/2017      OT End of Session - 06/24/17 1020    Visit Number 28   Number of Visits 32   Date for OT Re-Evaluation 08/06/17   Authorization Type medicare G-code 8   OT Start Time 1005   OT Stop Time 1045   OT Time Calculation (min) 40 min   Activity Tolerance Patient tolerated treatment well   Behavior During Therapy WFL for tasks assessed/performed      Past Medical History:  Diagnosis Date  . BPH (benign prostatic hyperplasia)   . Coronary artery disease   . Hypertension     Past Surgical History:  Procedure Laterality Date  . Intestinal Blockage    . LOOP RECORDER INSERTION N/A 05/14/2017   Procedure: Loop Recorder Insertion;  Surgeon: [name], MD;  Location: [place] [facility];  Service: Cardiovascular;  Laterality: N/A;  . SKIN CANCER EXCISION     x2  . TONSILLECTOMY      There were no vitals filed for this visit.      Subjective Assessment - 06/24/17 1018    Subjective  Pt. reports he has been [place] with his family in [place], [place].   Patient is accompained by: Family member   Currently in Pain? No/denies        OT TREATMENT:  Selfcare:  Pt. Worked on visual scanning, and visual saccades left to right to improve visual scanning during tabletop ADL and IADL tasks in his near space. Pt. worked  [employer], and items placed at 6" width apart. Pt. had numerous omissions. Pt. Required increased time to complete, and increased time to check his work.                         OT Education - 06/24/17 1019    Education provided Yes          OT Short Term Goals - 02/14/17 1457      OT SHORT  TERM GOAL #1   Title Pt and wife will be mod I with HEP - 02/21/2017   Time 4   Period Weeks   Status On-going  needs reinforcement of coordination HEP     OT SHORT TERM GOAL #2   Title Pt will be mod I with shower transfers   Status Achieved  met per pt report     OT SHORT TERM GOAL #3   Title Pt will be mod I with bathing   Status Achieved     OT SHORT TERM GOAL #4   Title Pt will complete table top scanning activity (not lanaguage or letter based) with 75% accuracy   Status Achieved  met for copying small peg design with min v.c.     OT SHORT TERM GOAL #5   Title Pt will attend to familiar task in busy environment with no more than min vc's.     Status Achieved           OT Long Term Goals - 05/13/17 0936      OT LONG TERM GOAL #1   Title Pt and wife will be mod I with ugraded HEP prn - 03/21/2017   Time  12   Period Weeks   Status On-going     OT LONG TERM GOAL #5   Title Pt will require no more than 2 vc's to recall information from therapy session   Time 12   Period Weeks   Status On-going     OT LONG TERM GOAL #6   Title Pt will complete table top scanning activity (not language based) efficiently with 90% accuracy   Time 12   Period Weeks   Status Revised     OT LONG TERM GOAL #7   Title Pt will complete environmental scanning tasks efficiently with 100% accuracy    Time 12   Period Weeks   Status On-going     OT LONG TERM GOAL #8   Title Pt will perform simple divided attention with environmental scanning for improved safety for ambulation/in prep for driving with at least 75% accuracy.   Baseline Pt. will demonstrate visual compensatory strategies 100% of the time during ADLs, and IADLs.    Time 12   Period Weeks   Status New               Plan - 06/24/17 1020    Clinical Impression Statement Pt. has been vacationing with his family at the beach in [place], [place] for 2 weeks. Pt. reports since his vacation, he has slowed down a bit, and  everything, including morning care and oral care, is taking a little longer to complete. Pt. reports he went to the wrong entrance to the hospital of the hospaital this morning and realized it was the wrong entrance. Pt. reports he thought maybe the entrance was remodeled since he was gone. Pt. had more omissions on tabletop visual scanning tasks, with increased time required today.    Occupational performance deficits (Please refer to evaluation for details): ADL's;IADL's   Rehab Potential Good   OT Frequency 2x / week   OT Duration 8 weeks   OT Treatment/Interventions Self-care/ADL training;Electrical Stimulation;Therapeutic exercise;Neuromuscular education;DME and/or AE instruction;Energy conservation;Therapist, nutritional;Therapeutic activities;Cognitive remediation/compensation;Visual/perceptual remediation/compensation;Patient/family education;Balance training   Consulted and Agree with Plan of Care Patient      Patient will benefit from skilled therapeutic intervention in order to improve the following deficits and impairments:  Decreased balance, Decreased cognition, Decreased coordination, Decreased safety awareness, Decreased mobility, Decreased knowledge of use of DME, Impaired UE functional use, Impaired vision/preception  Visit Diagnosis: Hemianopsia    Problem List Patient Active Problem List   Diagnosis Date Noted  . Stenosis of right carotid artery 02/01/2017  . Acute embolic stroke ([place]) 85/63/1497  . Bradycardia 11/10/2015  . Essential tremor 06/03/2015  . Dizziness 08/21/2012  . Syncope 04/10/2011  . CALF PAIN, LEFT 10/11/2010  . DYSPEPSIA 07/31/2010  . CHEST DISCOMFORT 07/31/2010  . Hyperlipidemia 05/16/2010  . HYPERTENSION, BENIGN 05/16/2010  . CAD, NATIVE VESSEL 05/16/2010    Harrel Carina, MS, OTR/L 06/24/2017, 10:37 AM  [place] MAIN [facility] SERVICES [address] [place], [place], [address] Phone:  [phone]   Fax:  [phone]  Name: [name] [facility]. MRN: [id number] Date of Birth: [birth year]

## 2017-06-25 ENCOUNTER — Encounter: Payer: Self-pay | Admitting: Speech Pathology

## 2017-06-25 NOTE — Therapy (Unsigned)
[place] [address] [place], [place], [address] Phone: [phone]   Fax:  [phone]  Patient Details  Name: [name]. MRN: [id number] Date of Birth: 05-Aug-1933 Referring Provider:  No ref. provider found  Encounter Date: 06/25/2017   SPEECH THERAPY DISCHARGE SUMMARY  Visits from Start of Care: ***  Current functional level related to goals / functional outcomes:  See goals below      SLP Long Term Goals - 05/29/17 1144      SLP LONG TERM GOAL #1   Title pt will use compensations for functional 10 minute mod-max complex conversation   Time 5   Period Weeks   Status Partially Met     SLP LONG TERM GOAL #2   Title  pt will demo [facility] tasks requiring memory compensations (meds, appointments, etc)   Time 6   Period Weeks   Status Achieved     SLP LONG TERM GOAL #3   Title pt will demo functional reading comprehension in 3-4 sentence paragraphs   Time 6   Period Weeks   Status Achieved     SLP LONG TERM GOAL #4   Title Patient will generate grammatical and meaningful sentences to complete moderate cognitive-linguistic task with 80% accruacy.   Time 7   Period Weeks   Status Partially Met     SLP LONG TERM GOAL #5   Title Patient will complete high level word finding activities with 80% accuracy.   Time 7   Period Weeks   Status Partially Met        Remaining deficits: ***   Education / Equipment: ***  Plan: Patient agrees to discharge.  Patient goals were not met. Patient is being discharged due to meeting the stated rehab goals.  ?????        Alice Rieger Annye Rusk 06/25/2017, 12:25 PM  [place] [address] [place], [place], [address] Phone: [phone]   Fax:  [phone]

## 2017-06-25 NOTE — Therapy (Signed)
[place] [address] [place], [place], [address] Phone: [phone]   Fax:  [phone]  Patient Details  Name: [name]. MRN: [id number] Date of Birth: Apr 24, 1933 Referring Provider:  No ref. provider found  Encounter Date: 06/25/2017   SPEECH THERAPY DISCHARGE SUMMARY  Visits from Start of Care: 12  Current functional level related to goals / functional outcomes:  See goals below      SLP Long Term Goals - 05/29/17 1144      SLP LONG TERM GOAL #1   Title pt will use compensations for functional 10 minute mod-max complex conversation   Time 5   Period Weeks   Status Partially Met     SLP LONG TERM GOAL #2   Title  pt will demo [facility] tasks requiring memory compensations (meds, appointments, etc)   Time 6   Period Weeks   Status Achieved     SLP LONG TERM GOAL #3   Title pt will demo functional reading comprehension in 3-4 sentence paragraphs   Time 6   Period Weeks   Status Achieved     SLP LONG TERM GOAL #4   Title Patient will generate grammatical and meaningful sentences to complete moderate cognitive-linguistic task with 80% accruacy.   Time 7   Period Weeks   Status Partially Met     SLP LONG TERM GOAL #5   Title Patient will complete high level word finding activities with 80% accuracy.   Time 7   Period Weeks   Status Partially Met       Remaining deficits: Aphasia   Education / Equipment: Compensations for aphasia  Plan: Patient agrees to discharge.  Patient goals were partially met. Patient is being discharged due to not returning since the last visit.  ?????        Lovv[place], Annye Rusk MS, CCC-SLP 06/25/2017, 12:28 PM  [place] [address] [place], [place], [address] Phone: [phone]   Fax:  [phone]

## 2017-06-26 ENCOUNTER — Encounter: Payer: Medicare Other | Admitting: Speech Pathology

## 2017-06-26 ENCOUNTER — Encounter: Payer: Medicare Other | Admitting: Occupational Therapy

## 2017-06-27 ENCOUNTER — Encounter: Payer: Self-pay | Admitting: Speech Pathology

## 2017-06-27 ENCOUNTER — Ambulatory Visit: Payer: Medicare Other | Admitting: Speech Pathology

## 2017-06-27 ENCOUNTER — Ambulatory Visit: Payer: Medicare Other | Admitting: Occupational Therapy

## 2017-06-27 DIAGNOSIS — H5347 Heteronymous bilateral field defects: Secondary | ICD-10-CM | POA: Diagnosis not present

## 2017-06-27 DIAGNOSIS — R4701 Aphasia: Secondary | ICD-10-CM

## 2017-06-27 NOTE — Therapy (Addendum)
[place] MAIN [facility] SERVICES [address] [place], [place], [address] Phone: [phone]   Fax:  [phone]  Occupational Therapy Treatment  Patient Details  Name: [name]. MRN: [id number] Date of Birth: [birth year] Referring Provider: Dr. Rea College  Encounter Date: 06/27/2017      OT End of Session - 06/27/17 1020    Visit Number 29   Number of Visits 32   Date for OT Re-Evaluation 08/06/17   Authorization Type medicare G-code 9   OT Start Time 1000   OT Stop Time 1045   OT Time Calculation (min) 45 min   Activity Tolerance Patient tolerated treatment well   Behavior During Therapy [facility] for tasks assessed/performed      Past Medical History:  Diagnosis Date  . BPH (benign prostatic hyperplasia)   . Coronary artery disease   . Hypertension     Past Surgical History:  Procedure Laterality Date  . Intestinal Blockage    . LOOP RECORDER INSERTION N/A 05/14/2017   Procedure: Loop Recorder Insertion;  Surgeon: [name], MD;  Location: [place] [facility];  Service: Cardiovascular;  Laterality: N/A;  . SKIN CANCER EXCISION     x2  . TONSILLECTOMY      There were no vitals filed for this visit.      Subjective Assessment - 06/27/17 1014    Subjective  Pt. reports he was able to come to the correct entrance this morning.   Patient is accompained by: Family member   Pertinent History see epic.  L CVA, R hemiopsia, Parkinson's, essential tremor   Patient Stated Goals I want to get well and be perfect again. I hate these questions (pt unable to give specific answer)   Currently in Pain? No/denies      OT TREATMENT    Selfcare:  Pt. worked on visual scanning, visual search strategies, and visual compensatory strategies using various degrees of challenging maps including street maps, and state maps. Pt. requires increased time to complete.                             OT  Education - 06/27/17 1016    Education provided Yes   Education Details navigating maps, and visual scanning, visual compensatory strategies.   Person(s) Educated Patient   Methods Explanation;Demonstration   Comprehension Verbalized understanding;Returned demonstration          OT Short Term Goals - 02/14/17 1457      OT SHORT TERM GOAL #1   Title Pt and wife will be mod I with HEP - 02/21/2017   Time 4   Period Weeks   Status On-going  needs reinforcement of coordination HEP     OT SHORT TERM GOAL #2   Title Pt will be mod I with shower transfers   Status Achieved  met per pt report     OT SHORT TERM GOAL #3   Title Pt will be mod I with bathing   Status Achieved     OT SHORT TERM GOAL #4   Title Pt will complete table top scanning activity (not lanaguage or letter based) with 75% accuracy   Status Achieved  met for copying small peg design with min v.c.     OT SHORT TERM GOAL #5   Title Pt will attend to familiar task in busy environment with no more than min vc's.     Status Achieved  OT Long Term Goals - 05/13/17 0936      OT LONG TERM GOAL #1   Title Pt and wife will be mod I with ugraded HEP prn - 03/21/2017   Time 12   Period Weeks   Status On-going     OT LONG TERM GOAL #5   Title Pt will require no more than 2 vc's to recall information from therapy session   Time 12   Period Weeks   Status On-going     OT LONG TERM GOAL #6   Title Pt will complete table top scanning activity (not language based) efficiently with 90% accuracy   Time 12   Period Weeks   Status Revised     OT LONG TERM GOAL #7   Title Pt will complete environmental scanning tasks efficiently with 100% accuracy    Time 12   Period Weeks   Status On-going     OT LONG TERM GOAL #8   Title Pt will perform simple divided attention with environmental scanning for improved safety for ambulation/in prep for driving with at least 75% accuracy.   Baseline Pt. will  demonstrate visual compensatory strategies 100% of the time during ADLs, and IADLs.    Time 12   Period Weeks   Status New               Plan - 06/27/17 1020    Clinical Impression Statement Pt. reports he was able to find his way to the correct entrance this morning. Pt. reports he has noticed that  it takes him longer to get ready, and to get acclimated to what he is supposed to be doing. Pt. reports he will sometimes go into a room, and forget what he went in there for., and will have to go in a second time. Pt. reports that this is happening more frequently now. Pt. continues to work on visual scanning, and visual search straegies, and  visual compensatory strategies for ADL, and IADL tasks.   Occupational performance deficits (Please refer to evaluation for details): ADL's;IADL's   Rehab Potential Good   OT Frequency 2x / week   OT Duration 8 weeks   OT Treatment/Interventions Self-care/ADL training;Electrical Stimulation;Therapeutic exercise;Neuromuscular education;DME and/or AE instruction;Energy conservation;Therapist, nutritional;Therapeutic activities;Cognitive remediation/compensation;Visual/perceptual remediation/compensation;Patient/family education;Balance training   Consulted and Agree with Plan of Care Patient      Patient will benefit from skilled therapeutic intervention in order to improve the following deficits and impairments:  Decreased balance, Decreased cognition, Decreased coordination, Decreased safety awareness, Decreased mobility, Decreased knowledge of use of DME, Impaired UE functional use, Impaired vision/preception  Visit Diagnosis: Hemianopsia    Problem List Patient Active Problem List   Diagnosis Date Noted  . Stenosis of right carotid artery 02/01/2017  . Acute embolic stroke ([place]) 16/09/9603  . Bradycardia 11/10/2015  . Essential tremor 06/03/2015  . Dizziness 08/21/2012  . Syncope 04/10/2011  . CALF PAIN, LEFT 10/11/2010  .  DYSPEPSIA 07/31/2010  . CHEST DISCOMFORT 07/31/2010  . Hyperlipidemia 05/16/2010  . HYPERTENSION, BENIGN 05/16/2010  . CAD, NATIVE VESSEL 05/16/2010    Harrel Carina, MS, OTR/L 06/27/2017, 10:35 AM  [place] MAIN [facility] SERVICES [address] [place], [place], [address] Phone: [phone]   Fax:  [phone]  Name: [name] [facility]. MRN: [id number] Date of Birth: 04-07-1933

## 2017-06-27 NOTE — Therapy (Signed)
[place] MAIN [facility] SERVICES [address] [place], [place], [address] Phone: [phone]   Fax:  [phone]  Speech Language Pathology Treatment  Patient Details  Name: [name]. MRN: [id number] Date of Birth: 02-03-1933 Referring Provider: Launa Grill, MD  Encounter Date: 06/27/2017      End of Session - 06/27/17 1207    Visit Number 33   Number of Visits 45   Date for SLP Re-Evaluation 07/29/17   SLP Start Time 1045   SLP Stop Time  1140   SLP Time Calculation (min) 55 min   Activity Tolerance Patient tolerated treatment well      Past Medical History:  Diagnosis Date  . BPH (benign prostatic hyperplasia)   . Coronary artery disease   . Hypertension     Past Surgical History:  Procedure Laterality Date  . Intestinal Blockage    . LOOP RECORDER INSERTION N/A 05/14/2017   Procedure: Loop Recorder Insertion;  Surgeon: [name], MD;  Location: [place] [facility];  Service: Cardiovascular;  Laterality: N/A;  . SKIN CANCER EXCISION     x2  . TONSILLECTOMY      There were no vitals filed for this visit.      Subjective Assessment - 06/27/17 1206    Subjective "The problems I have now are nothing I can't live with."   Currently in Pain? No/denies               ADULT SLP TREATMENT - 06/27/17 0001      General Information   Behavior/Cognition Alert;Cooperative;Pleasant mood     Treatment Provided   Treatment provided Cognitive-Linquistic     Pain Assessment   Pain Assessment No/denies pain     Cognitive-Linquistic Treatment   Treatment focused on Aphasia   Skilled Treatment Patient and clinician engaged in conversation for the first few minutes of the session. Patient experienced some word-finding difficulties during the conversation but provided effective circumlocution and gesturing to get his point across. Patient read complex math problems with 80% accuracy given clinician cues to  slow down and focus. Patient determined hypothetical cause/effect relationships with 100% accuracy and read with 100% accuracy. Patient read a short story out loud and answered comprehension questions about it with 85% accuracy.      Assessment / Recommendations / Plan   Plan Continue with current plan of care     Progression Toward Goals   Progression toward goals Progressing toward goals          SLP Education - 06/27/17 1207    Education provided Yes   Education Details Expectations for recovery process   Person(s) Educated Patient   Methods Explanation   Comprehension Verbalized understanding          SLP Short Term Goals - 04/01/17 1015      SLP SHORT TERM GOAL #1   Baseline 04/01/17 - avg 8          SLP Long Term Goals - 05/29/17 1144      SLP LONG TERM GOAL #1   Title pt will use compensations for functional 10 minute mod-max complex conversation   Time 5   Period Weeks   Status Partially Met     SLP LONG TERM GOAL #2   Title  pt will demo [facility] tasks requiring memory compensations (meds, appointments, etc)   Time 6   Period Weeks   Status Achieved     SLP LONG TERM GOAL #3  Title pt will demo functional reading comprehension in 3-4 sentence paragraphs   Time 6   Period Weeks   Status Achieved     SLP LONG TERM GOAL #4   Title Patient will generate grammatical and meaningful sentences to complete moderate cognitive-linguistic task with 80% accruacy.   Time 7   Period Weeks   Status Partially Met     SLP LONG TERM GOAL #5   Title Patient will complete high level word finding activities with 80% accuracy.   Time 7   Period Weeks   Status Partially Met          Plan - 06/27/17 1207    Clinical Impression Statement Patient is producing fewer word errors while reading task instructions during the session and is catching himself more often when he does make them. Patient's number reading accuracy improves significantly when he slows down and  concentrates. Clinician will continue to elicit language opportunities and incorporate math problems in the sessions.   Speech Therapy Frequency 2x / week   Duration Other (comment)   Treatment/Interventions Language facilitation;Cueing hierarchy;SLP instruction and feedback;Cognitive reorganization   Potential to Achieve Goals Good   Potential Considerations Ability to learn/carryover information;Cooperation/participation level;Previous level of function;Severity of impairments   SLP Home Exercise Plan Do math problems and other cognitively stimulating activities at home.   Consulted and Agree with Plan of Care Patient      Patient will benefit from skilled therapeutic intervention in order to improve the following deficits and impairments:   Aphasia    Problem List Patient Active Problem List   Diagnosis Date Noted  . Stenosis of right carotid artery 02/01/2017  . Acute embolic stroke ([place]) 67/73/7366  . Bradycardia 11/10/2015  . Essential tremor 06/03/2015  . Dizziness 08/21/2012  . Syncope 04/10/2011  . CALF PAIN, LEFT 10/11/2010  . DYSPEPSIA 07/31/2010  . CHEST DISCOMFORT 07/31/2010  . Hyperlipidemia 05/16/2010  . HYPERTENSION, BENIGN 05/16/2010  . CAD, NATIVE VESSEL 05/16/2010    Aline August 06/27/2017, 12:08 PM  [place] MAIN [facility] SERVICES [address] [place], [place], [address] Phone: [phone]   Fax:  [phone]   Name: Santiel Topper [facility]. MRN: [id number] Date of Birth: 1933-03-15

## 2017-07-01 ENCOUNTER — Ambulatory Visit: Payer: Medicare Other | Admitting: Occupational Therapy

## 2017-07-01 ENCOUNTER — Encounter: Payer: Self-pay | Admitting: Speech Pathology

## 2017-07-01 ENCOUNTER — Ambulatory Visit: Payer: Medicare Other | Admitting: Speech Pathology

## 2017-07-01 DIAGNOSIS — H5347 Heteronymous bilateral field defects: Secondary | ICD-10-CM

## 2017-07-01 DIAGNOSIS — R41841 Cognitive communication deficit: Secondary | ICD-10-CM

## 2017-07-01 DIAGNOSIS — R4701 Aphasia: Secondary | ICD-10-CM

## 2017-07-01 NOTE — Therapy (Signed)
[place] MAIN [facility] SERVICES [address] [place], [place], [address] Phone: [phone]   Fax:  [phone]  Occupational Therapy Treatment/G-Code Note  Patient Details  Name: [name] [facility] MRN: [id number] Date of Birth: June 30, 1933 Referring Provider: Dr. Rea College  Encounter Date: 07/01/2017      OT End of Session - 07/01/17 1039    Visit Number 30   Number of Visits 32   Date for OT Re-Evaluation 08/06/17   Authorization Type medicare G-code 10   OT Start Time 1004   OT Stop Time 1045   OT Time Calculation (min) 41 min   Activity Tolerance Patient tolerated treatment well   Behavior During Therapy [facility] for tasks assessed/performed      Past Medical History:  Diagnosis Date  . BPH (benign prostatic hyperplasia)   . Coronary artery disease   . Hypertension     Past Surgical History:  Procedure Laterality Date  . Intestinal Blockage    . LOOP RECORDER INSERTION N/A 05/14/2017   Procedure: Loop Recorder Insertion;  Surgeon: [name], MD;  Location: [place] [facility];  Service: Cardiovascular;  Laterality: N/A;  . SKIN CANCER EXCISION     x2  . TONSILLECTOMY      There were no vitals filed for this visit.      Subjective Assessment - 07/01/17 1037    Subjective  Pt. reports he went to a funeral this past weekend, and that several of his children came home.   Patient is accompained by: Family member   Pertinent History see epic.  L CVA, R hemiopsia, Parkinson's, essential tremor   Patient Stated Goals I want to get well and be perfect again. I hate these questions (pt unable to give specific answer)   Currently in Pain? No/denies      OT TREATMENT    Selfcare:  Pt. Worked on visual scanning tasks, visual search strategies, and visual compensatory strategies for tabletop ADL,a nd IADL tasks.                              OT Education - 07/01/17 1039    Education provided  Yes   Education Details Visual search strategies.   Person(s) Educated Patient   Methods Explanation   Comprehension Verbalized understanding          OT Short Term Goals - 02/14/17 1457      OT SHORT TERM GOAL #1   Title Pt and wife will be mod I with HEP - 02/21/2017   Time 4   Period Weeks   Status On-going  needs reinforcement of coordination HEP     OT SHORT TERM GOAL #2   Title Pt will be mod I with shower transfers   Status Achieved  met per pt report     OT SHORT TERM GOAL #3   Title Pt will be mod I with bathing   Status Achieved     OT SHORT TERM GOAL #4   Title Pt will complete table top scanning activity (not lanaguage or letter based) with 75% accuracy   Status Achieved  met for copying small peg design with min v.c.     OT SHORT TERM GOAL #5   Title Pt will attend to familiar task in busy environment with no more than min vc's.     Status Achieved  OT Long Term Goals - 2017-07-09 1104      OT LONG TERM GOAL #1   Title Pt and wife will be mod I with ugraded HEP prn - 07/09/17   Time 12   Period Weeks   Status On-going     OT LONG TERM GOAL #2   Time --     OT LONG TERM GOAL #5   Title Pt will require no more than 2 vc's to recall information from therapy session   Time 12   Period Weeks   Status On-going     OT LONG TERM GOAL #6   Title Pt will complete table top scanning activity (not language based) efficiently with 100% accuracy   Time 12   Period Weeks   Status Revised     OT LONG TERM GOAL #7   Title Pt will complete environmental scanning tasks efficiently with 100% accuracy    Time 12   Period Weeks   Status On-going     OT LONG TERM GOAL #8   Title Pt will perform simple divided attention with environmental scanning for improved safety for ambulation/in prep for driving with at least 75% accuracy.   Baseline Pt. will demonstrate visual compensatory strategies 100% of the time during ADLs, and IADLs.    Time 12    Period Weeks   Status On-going               Plan - 2017-07-09 1040    Clinical Impression Statement Pt. visual scanning was assessed using the BiVABA. The single letter search-simple was completed in 2 min. and 15 sec. with one omission in the center, The single letter search-crowded was completed in 3 min. and  11sec.,  with 1 omission to the right, word search was completed in 4 min and 6 sec., structured complex circles search was completed in 1 min and 29 sec. with 3 misidentifications, random plain circles was completed in 32 sec., random plain circles-crowded wascompleted in 40 sec., and the random complex circles search was completed in 1 min. and 48 sec. with 3 misidentifications. Pt. utilized horizontal rectilinear searches throughout all the searches. Pt. utilized some vertical rectilinear search patterns during the random complex circles search. Pt. Goals were reviewed.  Pt. continues to work on improving visual scanning, visual search strategies, and visual compensatory strategies for ADLs, and IADLs.   Occupational performance deficits (Please refer to evaluation for details): ADL's;IADL's   Rehab Potential Good   OT Frequency 2x / week   OT Duration 8 weeks   OT Treatment/Interventions Self-care/ADL training;Electrical Stimulation;Therapeutic exercise;Neuromuscular education;DME and/or AE instruction;Energy conservation;Therapist, nutritional;Therapeutic activities;Cognitive remediation/compensation;Visual/perceptual remediation/compensation;Patient/family education;Balance training   Consulted and Agree with Plan of Care Patient      Patient will benefit from skilled therapeutic intervention in order to improve the following deficits and impairments:  Decreased balance, Decreased cognition, Decreased coordination, Decreased safety awareness, Decreased mobility, Decreased knowledge of use of DME, Impaired UE functional use, Impaired vision/preception  Visit  Diagnosis: Hemianopsia      G-Codes - July 09, 2017 1108    Functional Assessment Tool Used (Outpatient only) skilled clinical observation   Functional Limitation Self care   Self Care Current Status (O1607) At least 1 percent but less than 20 percent impaired, limited or restricted   Self Care Goal Status (P7106) 0 percent impaired, limited or restricted      Problem List Patient Active Problem List   Diagnosis Date Noted  . Stenosis of right carotid artery 02/01/2017  .  Acute embolic stroke ([place]) 83/12/5994  . Bradycardia 11/10/2015  . Essential tremor 06/03/2015  . Dizziness 08/21/2012  . Syncope 04/10/2011  . CALF PAIN, LEFT 10/11/2010  . DYSPEPSIA 07/31/2010  . CHEST DISCOMFORT 07/31/2010  . Hyperlipidemia 05/16/2010  . HYPERTENSION, BENIGN 05/16/2010  . CAD, NATIVE VESSEL 05/16/2010    Harrel Carina, MS, OTR/L 07/01/2017, 11:09 AM  [place] MAIN [facility] SERVICES [address] [place], [place], [address] Phone: [phone]   Fax:  [phone]  Name: Jhett Fretwell [facility]. MRN: [id number] Date of Birth: [birth year]

## 2017-07-01 NOTE — Therapy (Signed)
[place] MAIN [facility] SERVICES [address] [place], [place], [address] Phone: [phone]   Fax:  [phone]  Speech Language Pathology Treatment  Patient Details  Name: [name]. MRN: [id number] Date of Birth: 07-Feb-1933 Referring Provider: Launa Grill, MD  Encounter Date: 07/01/2017      End of Session - 07/01/17 1539    Visit Number 34   Number of Visits 45   Date for SLP Re-Evaluation 07/29/17   SLP Start Time 14   SLP Stop Time  1149   SLP Time Calculation (min) 59 min   Activity Tolerance Patient tolerated treatment well      Past Medical History:  Diagnosis Date  . BPH (benign prostatic hyperplasia)   . Coronary artery disease   . Hypertension     Past Surgical History:  Procedure Laterality Date  . Intestinal Blockage    . LOOP RECORDER INSERTION N/A 05/14/2017   Procedure: Loop Recorder Insertion;  Surgeon: [name], MD;  Location: [place] [facility];  Service: Cardiovascular;  Laterality: N/A;  . SKIN CANCER EXCISION     x2  . TONSILLECTOMY      There were no vitals filed for this visit.      Subjective Assessment - 07/01/17 1538    Subjective "My words come slower when I have something pressing me"   Currently in Pain? No/denies               ADULT SLP TREATMENT - 07/01/17 0001      General Information   Behavior/Cognition Alert;Cooperative;Pleasant mood     Treatment Provided   Treatment provided Cognitive-Linquistic     Pain Assessment   Pain Assessment No/denies pain     Cognitive-Linquistic Treatment   Treatment focused on Aphasia   Skilled Treatment WORD FINDING: Named 45/60 pictures (from the [place]), he is able to benefit from semantic and phonemic cues.  State word given multiple cues with 80% accuracy.     Assessment / Recommendations / Plan   Plan Continue with current plan of care     Progression Toward Goals   Progression toward goals  Progressing toward goals          SLP Education - 07/01/17 1539    Education provided Yes   Education Details Allow your self time   Person(s) Educated Patient   Methods Explanation   Comprehension Verbalized understanding          SLP Short Term Goals - 04/01/17 1015      SLP SHORT TERM GOAL #1   Baseline 04/01/17 - avg 8          SLP Long Term Goals - 05/29/17 1144      SLP LONG TERM GOAL #1   Title pt will use compensations for functional 10 minute mod-max complex conversation   Time 5   Period Weeks   Status Partially Met     SLP LONG TERM GOAL #2   Title  pt will demo [facility] tasks requiring memory compensations (meds, appointments, etc)   Time 6   Period Weeks   Status Achieved     SLP LONG TERM GOAL #3   Title pt will demo functional reading comprehension in 3-4 sentence paragraphs   Time 6   Period Weeks   Status Achieved     SLP LONG TERM GOAL #4   Title Patient will generate grammatical and meaningful sentences to complete moderate cognitive-linguistic task with 80%  accruacy.   Time 7   Period Weeks   Status Partially Met     SLP LONG TERM GOAL #5   Title Patient will complete high level word finding activities with 80% accuracy.   Time 7   Period Weeks   Status Partially Met          Plan - 07/01/17 1540    Clinical Impression Statement The patient is able to self-cue for word finding.  The patient continues to express frustration that it takes him longer to complete relatively simple language tasks.  Patient demonstrates fewer language errors including improper word usage, reduced flexibility for multiple meanings, and vague language.      Speech Therapy Frequency 2x / week   Duration Other (comment)   Treatment/Interventions Language facilitation;Cueing hierarchy;SLP instruction and feedback;Cognitive reorganization   Potential to Achieve Goals Good   Potential Considerations Ability to learn/carryover information;Cooperation/participation  level;Previous level of function;Severity of impairments   SLP Home Exercise Plan Do math problems and other cognitively stimulating activities at home.   Consulted and Agree with Plan of Care Patient      Patient will benefit from skilled therapeutic intervention in order to improve the following deficits and impairments:   Aphasia  Cognitive communication deficit    Problem List Patient Active Problem List   Diagnosis Date Noted  . Stenosis of right carotid artery 02/01/2017  . Acute embolic stroke ([place]) 92/12/69  . Bradycardia 11/10/2015  . Essential tremor 06/03/2015  . Dizziness 08/21/2012  . Syncope 04/10/2011  . CALF PAIN, LEFT 10/11/2010  . DYSPEPSIA 07/31/2010  . CHEST DISCOMFORT 07/31/2010  . Hyperlipidemia 05/16/2010  . HYPERTENSION, BENIGN 05/16/2010  . CAD, NATIVE VESSEL 05/16/2010   Leroy Sea, MS/CCC- SLP  Lou Miner 07/01/2017, 3:41 PM  [place] MAIN [facility] SERVICES [address] [place], [place], [address] Phone: [phone]   Fax:  [phone]   Name: [name] [facility]. MRN: [id number] Date of Birth: 12/05/1932

## 2017-07-03 ENCOUNTER — Ambulatory Visit: Payer: Medicare Other | Attending: Internal Medicine | Admitting: Occupational Therapy

## 2017-07-03 ENCOUNTER — Encounter: Payer: Self-pay | Admitting: Speech Pathology

## 2017-07-03 ENCOUNTER — Ambulatory Visit: Payer: Medicare Other | Admitting: Speech Pathology

## 2017-07-03 DIAGNOSIS — R4701 Aphasia: Secondary | ICD-10-CM | POA: Insufficient documentation

## 2017-07-03 DIAGNOSIS — I69318 Other symptoms and signs involving cognitive functions following cerebral infarction: Secondary | ICD-10-CM | POA: Insufficient documentation

## 2017-07-03 DIAGNOSIS — R251 Tremor, unspecified: Secondary | ICD-10-CM | POA: Insufficient documentation

## 2017-07-03 DIAGNOSIS — R41841 Cognitive communication deficit: Secondary | ICD-10-CM | POA: Diagnosis present

## 2017-07-03 DIAGNOSIS — M6281 Muscle weakness (generalized): Secondary | ICD-10-CM | POA: Insufficient documentation

## 2017-07-03 DIAGNOSIS — R278 Other lack of coordination: Secondary | ICD-10-CM | POA: Insufficient documentation

## 2017-07-03 DIAGNOSIS — H5347 Heteronymous bilateral field defects: Secondary | ICD-10-CM | POA: Diagnosis present

## 2017-07-03 NOTE — Therapy (Signed)
[place] MAIN [facility] SERVICES [address] [place], [place], [address] Phone: [phone]   Fax:  [phone]  Speech Language Pathology Treatment  Patient Details  Name: [name]. MRN: [id number] Date of Birth: 19-Aug-1933 Referring Provider: Launa Grill, MD  Encounter Date: 07/03/2017      End of Session - 07/03/17 1521    Visit Number 35   Number of Visits 45   Date for SLP Re-Evaluation 07/29/17   SLP Start Time 3   SLP Stop Time  1145   SLP Time Calculation (min) 55 min   Activity Tolerance Patient tolerated treatment well      Past Medical History:  Diagnosis Date  . BPH (benign prostatic hyperplasia)   . Coronary artery disease   . Hypertension     Past Surgical History:  Procedure Laterality Date  . Intestinal Blockage    . LOOP RECORDER INSERTION N/A 05/14/2017   Procedure: Loop Recorder Insertion;  Surgeon: [name], MD;  Location: [place] [facility];  Service: Cardiovascular;  Laterality: N/A;  . SKIN CANCER EXCISION     x2  . TONSILLECTOMY      There were no vitals filed for this visit.      Subjective Assessment - 07/03/17 1519    Subjective The patient exhibits frustration when encountering difficulty with relatively easy task   Currently in Pain? No/denies               ADULT SLP TREATMENT - 07/03/17 0001      General Information   Behavior/Cognition Alert;Cooperative;Pleasant mood     Treatment Provided   Treatment provided Cognitive-Linquistic     Pain Assessment   Pain Assessment No/denies pain     Cognitive-Linquistic Treatment   Treatment focused on Aphasia   Skilled Treatment LANGUAGE/WORD FINDING: identify word in list of 5 that does not belong with 80% accuracy and generate one additional word to list with 80% accuracy.  Complete simple verbal analogies, given 3 possible responses, with 70% accuracy independently and 100% given min cues ("that's not the  best response").  NUMBERS: Sequence a list of numbers accurately but demonstrates impulsive responses requiring him to "back track" and self-correct errors.     Assessment / Recommendations / Plan   Plan Continue with current plan of care     Progression Toward Goals   Progression toward goals Progressing toward goals          SLP Education - 07/03/17 1521    Education provided Yes   Education Details Take your time, attend to details   Person(s) Educated Patient   Methods Explanation   Comprehension Verbalized understanding          SLP Short Term Goals - 04/01/17 1015      SLP SHORT TERM GOAL #1   Baseline 04/01/17 - avg 8          SLP Long Term Goals - 05/29/17 1144      SLP LONG TERM GOAL #1   Title pt will use compensations for functional 10 minute mod-max complex conversation   Time 5   Period Weeks   Status Partially Met     SLP LONG TERM GOAL #2   Title  pt will demo [facility] tasks requiring memory compensations (meds, appointments, etc)   Time 6   Period Weeks   Status Achieved     SLP LONG TERM GOAL #3   Title pt will demo functional reading comprehension  in 3-4 sentence paragraphs   Time 6   Period Weeks   Status Achieved     SLP LONG TERM GOAL #4   Title Patient will generate grammatical and meaningful sentences to complete moderate cognitive-linguistic task with 80% accruacy.   Time 7   Period Weeks   Status Partially Met     SLP LONG TERM GOAL #5   Title Patient will complete high level word finding activities with 80% accuracy.   Time 7   Period Weeks   Status Partially Met          Plan - 07/03/17 1522    Clinical Impression Statement The patient is able to self-cue for word finding.  The patient continues to express frustration that it takes him longer to complete relatively simple language tasks.  Patient demonstrates fewer language errors including improper word usage, reduced flexibility for multiple meanings, and vague language.       Speech Therapy Frequency 2x / week   Duration Other (comment)   Treatment/Interventions Language facilitation;Cueing hierarchy;SLP instruction and feedback;Cognitive reorganization   Potential to Achieve Goals Good   Potential Considerations Ability to learn/carryover information;Cooperation/participation level;Previous level of function;Severity of impairments   SLP Home Exercise Plan Number sequencing task   Consulted and Agree with Plan of Care Patient      Patient will benefit from skilled therapeutic intervention in order to improve the following deficits and impairments:   Aphasia  Cognitive communication deficit    Problem List Patient Active Problem List   Diagnosis Date Noted  . Stenosis of right carotid artery 02/01/2017  . Acute embolic stroke ([place]) 56/97/9480  . Bradycardia 11/10/2015  . Essential tremor 06/03/2015  . Dizziness 08/21/2012  . Syncope 04/10/2011  . CALF PAIN, LEFT 10/11/2010  . DYSPEPSIA 07/31/2010  . CHEST DISCOMFORT 07/31/2010  . Hyperlipidemia 05/16/2010  . HYPERTENSION, BENIGN 05/16/2010  . CAD, NATIVE VESSEL 05/16/2010   Leroy Sea, MS/CCC- SLP  Lou Miner 07/03/2017, 3:23 PM  [place] MAIN [facility] SERVICES [address] [place], [place], [address] Phone: [phone]   Fax:  [phone]   Name: [name] [facility]. MRN: [id number] Date of Birth: 08-Aug-1933

## 2017-07-03 NOTE — Therapy (Addendum)
[place] MAIN [facility] SERVICES [address] [place], [place], [address] Phone: [phone]   Fax:  [phone]  Occupational Therapy Treatment  Patient Details  Name: [name]. MRN: [id number] Date of Birth: 04-Feb-1933 Referring Provider: Dr. Rea College  Encounter Date: 07/03/2017      OT End of Session - 07/03/17 1055    Visit Number 31   Number of Visits 21   Date for OT Re-Evaluation 08/06/17   Authorization Type medicare G-code 1   OT Start Time 1000   OT Stop Time 1045   OT Time Calculation (min) 45 min   Activity Tolerance Patient tolerated treatment well   Behavior During Therapy [facility] for tasks assessed/performed      Past Medical History:  Diagnosis Date  . BPH (benign prostatic hyperplasia)   . Coronary artery disease   . Hypertension     Past Surgical History:  Procedure Laterality Date  . Intestinal Blockage    . LOOP RECORDER INSERTION N/A 05/14/2017   Procedure: Loop Recorder Insertion;  Surgeon: [name], MD;  Location: [place] [facility];  Service: Cardiovascular;  Laterality: N/A;  . SKIN CANCER EXCISION     x2  . TONSILLECTOMY      There were no vitals filed for this visit.      Subjective Assessment - 07/03/17 1027    Subjective  Pt. reports he feels like he is making progress.   Patient is accompained by: Family member   Pertinent History see epic.  L CVA, R hemiopsia, Parkinson's, essential tremor   Patient Stated Goals I want to get well and be perfect again. I hate these questions (pt unable to give specific answer)   Currently in Pain? No/denies      OT TREATMENT    Selfcare:  Pt. worked on visual scanning, and visual search strategies for tabletop ADL, and IADL tasks. Pt. also worked on Teacher, English as a foreign language of varying degrees of difficulty. Pt. requires increased time to complete.                             OT Short Term Goals - 02/14/17 1457       OT SHORT TERM GOAL #1   Title Pt and wife will be mod I with HEP - 02/21/2017   Time 4   Period Weeks   Status On-going  needs reinforcement of coordination HEP     OT SHORT TERM GOAL #2   Title Pt will be mod I with shower transfers   Status Achieved  met per pt report     OT SHORT TERM GOAL #3   Title Pt will be mod I with bathing   Status Achieved     OT SHORT TERM GOAL #4   Title Pt will complete table top scanning activity (not lanaguage or letter based) with 75% accuracy   Status Achieved  met for copying small peg design with min v.c.     OT SHORT TERM GOAL #5   Title Pt will attend to familiar task in busy environment with no more than min vc's.     Status Achieved           OT Long Term Goals - 07/01/17 1104      OT LONG TERM GOAL #1   Title Pt and wife will be mod I with ugraded HEP prn - 07/01/2017   Time 12  Period Weeks   Status On-going     OT LONG TERM GOAL #2   Time --     OT LONG TERM GOAL #5   Title Pt will require no more than 2 vc's to recall information from therapy session   Time 12   Period Weeks   Status On-going     OT LONG TERM GOAL #6   Title Pt will complete table top scanning activity (not language based) efficiently with 100% accuracy   Time 12   Period Weeks   Status Revised     OT LONG TERM GOAL #7   Title Pt will complete environmental scanning tasks efficiently with 100% accuracy    Time 12   Period Weeks   Status On-going     OT LONG TERM GOAL #8   Title Pt will perform simple divided attention with environmental scanning for improved safety for ambulation/in prep for driving with at least 75% accuracy.   Baseline Pt. will demonstrate visual compensatory strategies 100% of the time during ADLs, and IADLs.    Time 12   Period Weeks   Status On-going               Plan - 07/03/17 1028    Clinical Impression Statement Pt. worked on visual scanning tasks, visual search strategies, and visual compensatory  strategies during ADL, tabletop tasks. Pt. presents with improved attention. Pt. continues to require increased time to complete.   Occupational performance deficits (Please refer to evaluation for details): ADL's;IADL's   Rehab Potential Good   OT Frequency 2x / week   OT Duration 12 weeks   OT Treatment/Interventions Self-care/ADL training;Electrical Stimulation;Therapeutic exercise;Neuromuscular education;DME and/or AE instruction;Energy conservation;Therapist, nutritional;Therapeutic activities;Cognitive remediation/compensation;Visual/perceptual remediation/compensation;Patient/family education;Balance training   Consulted and Agree with Plan of Care Patient      Patient will benefit from skilled therapeutic intervention in order to improve the following deficits and impairments:  Decreased balance, Decreased cognition, Decreased coordination, Decreased safety awareness, Decreased mobility, Decreased knowledge of use of DME, Impaired UE functional use, Impaired vision/preception  Visit Diagnosis: Muscle weakness (generalized)  Other lack of coordination    Problem List Patient Active Problem List   Diagnosis Date Noted  . Stenosis of right carotid artery 02/01/2017  . Acute embolic stroke ([place]) 13/14/3888  . Bradycardia 11/10/2015  . Essential tremor 06/03/2015  . Dizziness 08/21/2012  . Syncope 04/10/2011  . CALF PAIN, LEFT 10/11/2010  . DYSPEPSIA 07/31/2010  . CHEST DISCOMFORT 07/31/2010  . Hyperlipidemia 05/16/2010  . HYPERTENSION, BENIGN 05/16/2010  . CAD, NATIVE VESSEL 05/16/2010    Harrel Carina 07/03/2017, 11:00 AM  [place] MAIN [facility] SERVICES [address] [place], [place], [address] Phone: [phone]   Fax:  [phone]  Name: [name] [facility]. MRN: [id number] Date of Birth: [birth year]

## 2017-07-09 ENCOUNTER — Ambulatory Visit: Payer: Medicare Other | Admitting: Occupational Therapy

## 2017-07-09 ENCOUNTER — Ambulatory Visit: Payer: Medicare Other | Admitting: Speech Pathology

## 2017-07-09 DIAGNOSIS — H5347 Heteronymous bilateral field defects: Secondary | ICD-10-CM

## 2017-07-09 DIAGNOSIS — R41841 Cognitive communication deficit: Secondary | ICD-10-CM

## 2017-07-09 DIAGNOSIS — M6281 Muscle weakness (generalized): Secondary | ICD-10-CM | POA: Diagnosis not present

## 2017-07-09 DIAGNOSIS — R4701 Aphasia: Secondary | ICD-10-CM

## 2017-07-09 DIAGNOSIS — I69318 Other symptoms and signs involving cognitive functions following cerebral infarction: Secondary | ICD-10-CM

## 2017-07-09 NOTE — Therapy (Signed)
[place] [facility] MAIN [facility] SERVICES [address] [place], [place], [address] Phone: [phone]   Fax:  [phone]  Occupational Therapy Treatment  Patient Details  Name: [name]. MRN: [id number] Date of Birth: Dec 03, 1933 Referring Provider: Dr. Wilfrid Lund  Encounter Date: 07/09/2017      OT End of Session - 07/09/17 1114    Visit Number 32   Number of Visits 56   Date for OT Re-Evaluation 08/06/17   Authorization Type medicare G-code 2   OT Start Time 1100   OT Stop Time 1145   OT Time Calculation (min) 45 min   Activity Tolerance Patient tolerated treatment well   Behavior During Therapy [facility] for tasks assessed/performed      Past Medical History:  Diagnosis Date  . BPH (benign prostatic hyperplasia)   . Coronary artery disease   . Hypertension     Past Surgical History:  Procedure Laterality Date  . Intestinal Blockage    . LOOP RECORDER INSERTION N/A 05/14/2017   Procedure: Loop Recorder Insertion;  Surgeon: Duke Salvia, MD;  Location: [facility] INVASIVE [facility];  Service: Cardiovascular;  Laterality: N/A;  . SKIN CANCER EXCISION     x2  . TONSILLECTOMY      There were no vitals filed for this visit.      Subjective Assessment - 07/09/17 1111    Subjective  Pt. reports things are starting to settle back down for him.   Patient is accompained by: Family member   Pertinent History see epic.  L CVA, R hemiopsia, Parkinson's, essential tremor   Patient Stated Goals I want to get well and be perfect again. I hate these questions (pt unable to give specific answer)   Currently in Pain? No/denies   Pain Score 2    Pain Location Back   Pain Orientation Lower      OT TREATMENT    Selfcare:  Pt. worked on navigating event schedules, and monthly calendars. Pt. required increased time, verbal cues, and assist to navigate each. Pt. had more difficulty following the directions, and to initiate the calendar task.  Pt. required verbal cues for visual scanning and finding the events on the event schedule efficiently.                            OT Education - 07/09/17 1113    Education provided Yes   Education Details navigating schedules.   Person(s) Educated Patient   Methods Explanation   Comprehension Verbalized understanding             OT Long Term Goals - 07/01/17 1104      OT LONG TERM GOAL #1   Title Pt and wife will be mod I with ugraded HEP prn - 07/01/2017   Time 12   Period Weeks   Status On-going     OT LONG TERM GOAL #2   Time --     OT LONG TERM GOAL #5   Title Pt will require no more than 2 vc's to recall information from therapy session   Time 12   Period Weeks   Status On-going     OT LONG TERM GOAL #6   Title Pt will complete table top scanning activity (not language based) efficiently with 100% accuracy   Time 12   Period Weeks   Status Revised     OT LONG TERM GOAL #7   Title Pt  will complete environmental scanning tasks efficiently with 100% accuracy    Time 12   Period Weeks   Status On-going     OT LONG TERM GOAL #8   Title Pt will perform simple divided attention with environmental scanning for improved safety for ambulation/in prep for driving with at least 16%75% accuracy.   Baseline Pt. will demonstrate visual compensatory strategies 100% of the time during ADLs, and IADLs.    Time 12   Period Weeks   Status On-going               Plan - 07/09/17 1115    Clinical Impression Statement Pt. continues to require increased time, and verbal cues to visual scanning, visual search strategies, and visual compenssatory strategies. Pt. worked on navigating event schedules, and calendars.  Pt. required increased time to navigate through monthly calendars, and filling in events on the correct dates. Pt. continues to work on improving ADL, and IADL functioning.   Occupational performance deficits (Please refer to evaluation for  details): ADL's;IADL's   Rehab Potential Good   OT Frequency 2x / week   OT Duration 12 weeks   OT Treatment/Interventions Self-care/ADL training;Electrical Stimulation;Therapeutic exercise;Neuromuscular education;DME and/or AE instruction;Energy conservation;Building services engineerunctional Mobility Training;Therapeutic activities;Cognitive remediation/compensation;Visual/perceptual remediation/compensation;Patient/family education;Balance training   Consulted and Agree with Plan of Care Patient      Patient will benefit from skilled therapeutic intervention in order to improve the following deficits and impairments:  Decreased balance, Decreased cognition, Decreased coordination, Decreased safety awareness, Decreased mobility, Decreased knowledge of use of DME, Impaired UE functional use, Impaired vision/preception  Visit Diagnosis: Hemianopsia  Other symptoms and signs involving cognitive functions following cerebral infarction    Problem List Patient Active Problem List   Diagnosis Date Noted  . Stenosis of right carotid artery 02/01/2017  . Acute embolic stroke ([facility]) 02/01/2017  . Bradycardia 11/10/2015  . Essential tremor 06/03/2015  . Dizziness 08/21/2012  . Syncope 04/10/2011  . CALF PAIN, LEFT 10/11/2010  . DYSPEPSIA 07/31/2010  . CHEST DISCOMFORT 07/31/2010  . Hyperlipidemia 05/16/2010  . HYPERTENSION, BENIGN 05/16/2010  . CAD, NATIVE VESSEL 05/16/2010    Olegario MessierElaine Kavan Devan, MS, OTR/L 07/09/2017, 11:33 AM  [place] [facility] [address] [place], [place], [address] Phone: [phone]   Fax:  [phone]  Name: Calvin ShortsGeorge [facility] MRN: [id number] Date of Birth: Nov 29, 1933

## 2017-07-10 NOTE — Therapy (Signed)
[place] MAIN [facility] SERVICES [address] [place], [place], [address] Phone: [phone]   Fax:  [phone]  Speech Language Pathology Treatment  Patient Details  Name: [name]. MRN: [id number] Date of Birth: [birth year] Referring Provider: Launa Grill, MD  Encounter Date: 07/09/2017      End of Session - 07/10/17 1123    Visit Number 36   Number of Visits 45   Date for SLP Re-Evaluation 07/29/17   SLP Start Time 1000   SLP Stop Time  1055   SLP Time Calculation (min) 55 min   Activity Tolerance Patient tolerated treatment well      Past Medical History:  Diagnosis Date  . BPH (benign prostatic hyperplasia)   . Coronary artery disease   . Hypertension     Past Surgical History:  Procedure Laterality Date  . Intestinal Blockage    . LOOP RECORDER INSERTION N/A 05/14/2017   Procedure: Loop Recorder Insertion;  Surgeon: [name], MD;  Location: [place] [facility];  Service: Cardiovascular;  Laterality: N/A;  . SKIN CANCER EXCISION     x2  . TONSILLECTOMY      There were no vitals filed for this visit.      Subjective Assessment - 07/10/17 1122    Subjective The patient exhibits frustration when encountering difficulty with relatively easy task   Currently in Pain? No/denies               ADULT SLP TREATMENT - 07/10/17 0001      General Information   Behavior/Cognition Alert;Cooperative;Pleasant mood     Treatment Provided   Treatment provided Cognitive-Linquistic     Pain Assessment   Pain Assessment No/denies pain     Cognitive-Linquistic Treatment   Treatment focused on Aphasia   Skilled Treatment LANGUAGE/WORD FINDING: identify word in list of 5 that does not belong with 80% accuracy and generate one additional word to list with 80% accuracy given mod-max cues.       Assessment / Recommendations / Plan   Plan Continue with current plan of care     Progression Toward Goals    Progression toward goals Progressing toward goals          SLP Education - 07/10/17 1122    Education provided Yes   Education Details use strategies for anomia   Person(s) Educated Patient   Methods Explanation   Comprehension Verbalized understanding          SLP Short Term Goals - 04/01/17 1015      SLP SHORT TERM GOAL #1   Baseline 04/01/17 - avg 8          SLP Long Term Goals - 05/29/17 1144      SLP LONG TERM GOAL #1   Title pt will use compensations for functional 10 minute mod-max complex conversation   Time 5   Period Weeks   Status Partially Met     SLP LONG TERM GOAL #2   Title  pt will demo [facility] tasks requiring memory compensations (meds, appointments, etc)   Time 6   Period Weeks   Status Achieved     SLP LONG TERM GOAL #3   Title pt will demo functional reading comprehension in 3-4 sentence paragraphs   Time 6   Period Weeks   Status Achieved     SLP LONG TERM GOAL #4   Title Patient will generate grammatical and meaningful sentences to complete moderate cognitive-linguistic task  with 80% accruacy.   Time 7   Period Weeks   Status Partially Met     SLP LONG TERM GOAL #5   Title Patient will complete high level word finding activities with 80% accuracy.   Time 7   Period Weeks   Status Partially Met          Plan - 07/10/17 1123    Clinical Impression Statement The patient is able to self-cue for word finding.  The patient continues to express frustration that it takes him longer to complete relatively simple language tasks.  Patient demonstrates fewer language errors including improper word usage, reduced flexibility for multiple meanings, and vague language.      Speech Therapy Frequency 2x / week   Duration Other (comment)   Treatment/Interventions Language facilitation;Cueing hierarchy;SLP instruction and feedback;Cognitive reorganization   Potential to Achieve Goals Good   Potential Considerations Ability to learn/carryover  information;Cooperation/participation level;Previous level of function;Severity of impairments   SLP Home Exercise Plan stating commonality   Consulted and Agree with Plan of Care Patient      Patient will benefit from skilled therapeutic intervention in order to improve the following deficits and impairments:   Aphasia  Cognitive communication deficit    Problem List Patient Active Problem List   Diagnosis Date Noted  . Stenosis of right carotid artery 02/01/2017  . Acute embolic stroke ([place]) 76/70/1100  . Bradycardia 11/10/2015  . Essential tremor 06/03/2015  . Dizziness 08/21/2012  . Syncope 04/10/2011  . CALF PAIN, LEFT 10/11/2010  . DYSPEPSIA 07/31/2010  . CHEST DISCOMFORT 07/31/2010  . Hyperlipidemia 05/16/2010  . HYPERTENSION, BENIGN 05/16/2010  . CAD, NATIVE VESSEL 05/16/2010   Leroy Sea, MS/CCC- SLP  Lou Miner 07/10/2017, 11:24 AM  [place] MAIN [facility] SERVICES [address] [place], [place], [address] Phone: [phone]   Fax:  [phone]   Name: Calvin Tsang [facility]. MRN: [id number] Date of Birth: Jun 05, 1933

## 2017-07-11 ENCOUNTER — Ambulatory Visit: Payer: Medicare Other | Admitting: Occupational Therapy

## 2017-07-11 ENCOUNTER — Ambulatory Visit: Payer: Medicare Other | Admitting: Speech Pathology

## 2017-07-11 ENCOUNTER — Encounter: Payer: Self-pay | Admitting: Speech Pathology

## 2017-07-11 DIAGNOSIS — I69318 Other symptoms and signs involving cognitive functions following cerebral infarction: Secondary | ICD-10-CM

## 2017-07-11 DIAGNOSIS — M6281 Muscle weakness (generalized): Secondary | ICD-10-CM | POA: Diagnosis not present

## 2017-07-11 DIAGNOSIS — H5347 Heteronymous bilateral field defects: Secondary | ICD-10-CM

## 2017-07-11 DIAGNOSIS — R41841 Cognitive communication deficit: Secondary | ICD-10-CM

## 2017-07-11 DIAGNOSIS — R4701 Aphasia: Secondary | ICD-10-CM

## 2017-07-11 NOTE — Therapy (Signed)
[place] MAIN [facility] SERVICES [address] [place], [place], [address] Phone: [phone]   Fax:  [phone]  Speech Language Pathology Treatment  Patient Details  Name: [name]. MRN: [id number] Date of Birth: January 18, 1933 Referring Provider: Launa Grill, MD  Encounter Date: 07/11/2017      End of Session - 07/11/17 1059    Visit Number 37   Number of Visits 45   Date for SLP Re-Evaluation 07/29/17   SLP Start Time 1000   SLP Stop Time  1050   SLP Time Calculation (min) 50 min   Activity Tolerance Patient tolerated treatment well      Past Medical History:  Diagnosis Date  . BPH (benign prostatic hyperplasia)   . Coronary artery disease   . Hypertension     Past Surgical History:  Procedure Laterality Date  . Intestinal Blockage    . LOOP RECORDER INSERTION N/A 05/14/2017   Procedure: Loop Recorder Insertion;  Surgeon: [name], MD;  Location: [place] [facility];  Service: Cardiovascular;  Laterality: N/A;  . SKIN CANCER EXCISION     x2  . TONSILLECTOMY      There were no vitals filed for this visit.      Subjective Assessment - 07/11/17 1059    Subjective The patient exhibits frustration when encountering difficulty with relatively easy task   Currently in Pain? No/denies               ADULT SLP TREATMENT - 07/11/17 0001      General Information   Behavior/Cognition Alert;Cooperative;Pleasant mood     Treatment Provided   Treatment provided Cognitive-Linquistic     Pain Assessment   Pain Assessment No/denies pain     Cognitive-Linquistic Treatment   Treatment focused on Aphasia   Skilled Treatment LANGUAGE/WORD FINDING: identify word in list of 5 that does not belong with 100% accuracy and verbalize reason with mod cues to label category.   List 5 items in a concrete category with min-mod cues.  Name item in a category that begins with a specific letter with 100% accuracy and no  cues.  Add one item to a category with min cues.       Assessment / Recommendations / Plan   Plan Continue with current plan of care     Progression Toward Goals   Progression toward goals Progressing toward goals          SLP Education - 07/11/17 1059    Education provided Yes   Education Details relax and use strategies for anomia   Person(s) Educated Patient   Methods Explanation   Comprehension Verbalized understanding          SLP Short Term Goals - 04/01/17 1015      SLP SHORT TERM GOAL #1   Baseline 04/01/17 - avg 8          SLP Long Term Goals - 05/29/17 1144      SLP LONG TERM GOAL #1   Title pt will use compensations for functional 10 minute mod-max complex conversation   Time 5   Period Weeks   Status Partially Met     SLP LONG TERM GOAL #2   Title  pt will demo [facility] tasks requiring memory compensations (meds, appointments, etc)   Time 6   Period Weeks   Status Achieved     SLP LONG TERM GOAL #3   Title pt will demo functional reading comprehension in 3-4  sentence paragraphs   Time 6   Period Weeks   Status Achieved     SLP LONG TERM GOAL #4   Title Patient will generate grammatical and meaningful sentences to complete moderate cognitive-linguistic task with 80% accruacy.   Time 7   Period Weeks   Status Partially Met     SLP LONG TERM GOAL #5   Title Patient will complete high level word finding activities with 80% accuracy.   Time 7   Period Weeks   Status Partially Met          Plan - 07/11/17 1101    Clinical Impression Statement The patient is able to self-cue for word finding.  The patient continues to express frustration that it takes him longer to complete relatively simple language tasks.  Patient demonstrates fewer language errors including improper word usage, reduced flexibility for multiple meanings, and vague language.         Patient will benefit from skilled therapeutic intervention in order to improve the following  deficits and impairments:   Aphasia  Cognitive communication deficit    Problem List Patient Active Problem List   Diagnosis Date Noted  . Stenosis of right carotid artery 02/01/2017  . Acute embolic stroke ([place]) 83/08/4075  . Bradycardia 11/10/2015  . Essential tremor 06/03/2015  . Dizziness 08/21/2012  . Syncope 04/10/2011  . CALF PAIN, LEFT 10/11/2010  . DYSPEPSIA 07/31/2010  . CHEST DISCOMFORT 07/31/2010  . Hyperlipidemia 05/16/2010  . HYPERTENSION, BENIGN 05/16/2010  . CAD, NATIVE VESSEL 05/16/2010   Leroy Sea, MS/CCC- SLP  Lou Miner 07/11/2017, 11:02 AM  [place] MAIN [facility] SERVICES [address] [place], [place], [address] Phone: [phone]   Fax:  [phone]   Name: Calvin Snellgrove [facility]. MRN: [id number] Date of Birth: 1933-04-16

## 2017-07-11 NOTE — Therapy (Signed)
[place] MAIN [facility] SERVICES [address] [place], [place], [address] Phone: [phone]   Fax:  [phone]  Occupational Therapy Treatment  Patient Details  Name: [name]. MRN: [id number] Date of Birth: Dec 14, 1932 Referring Provider: Dr. Rea College  Encounter Date: 07/11/2017      OT End of Session - 07/11/17 1110    Visit Number 33   Number of Visits 66   Date for OT Re-Evaluation 08/06/17   Authorization Type medicare G-code 3   OT Start Time 1100   OT Stop Time 1145   OT Time Calculation (min) 45 min   Activity Tolerance Patient tolerated treatment well   Behavior During Therapy WFL for tasks assessed/performed      Past Medical History:  Diagnosis Date  . BPH (benign prostatic hyperplasia)   . Coronary artery disease   . Hypertension     Past Surgical History:  Procedure Laterality Date  . Intestinal Blockage    . LOOP RECORDER INSERTION N/A 05/14/2017   Procedure: Loop Recorder Insertion;  Surgeon: [name], MD;  Location: [place] [facility];  Service: Cardiovascular;  Laterality: N/A;  . SKIN CANCER EXCISION     x2  . TONSILLECTOMY      There were no vitals filed for this visit.      Subjective Assessment - 07/11/17 1107    Subjective  Pt. reports his driving is still going okay. Pt.    Patient is accompained by: Family member   Patient Stated Goals I want to get well and be perfect again. I hate these questions (pt unable to give specific answer)   Currently in Pain? No/denies      OT TREATMENT    Selfcare:  Pt. worked on Counsellor calendars, and event schedules. Pt. required had difficulty, and required verbal cues for identifying incorrect items on a calendar with the most difficulty in the weekdays, month, and days of the week section. Pt. Requires increased time navigating event schedules using visual, and cognitive compensatory  strategies.                           OT Education - 07/11/17 1109    Education provided Yes   Education Details visual compensatory strategies.   Person(s) Educated Patient   Methods Explanation   Comprehension Verbalized understanding          OT Short Term Goals - 02/14/17 1457      OT SHORT TERM GOAL #1   Title Pt and wife will be mod I with HEP - 02/21/2017   Time 4   Period Weeks   Status On-going  needs reinforcement of coordination HEP     OT SHORT TERM GOAL #2   Title Pt will be mod I with shower transfers   Status Achieved  met per pt report     OT SHORT TERM GOAL #3   Title Pt will be mod I with bathing   Status Achieved     OT SHORT TERM GOAL #4   Title Pt will complete table top scanning activity (not lanaguage or letter based) with 75% accuracy   Status Achieved  met for copying small peg design with min v.c.     OT SHORT TERM GOAL #5   Title Pt will attend to familiar task in busy environment with no more than min vc's.     Status Achieved  OT Long Term Goals - 07/01/17 1104      OT LONG TERM GOAL #1   Title Pt and wife will be mod I with ugraded HEP prn - 07/01/2017   Time 12   Period Weeks   Status On-going     OT LONG TERM GOAL #2   Time --     OT LONG TERM GOAL #5   Title Pt will require no more than 2 vc's to recall information from therapy session   Time 12   Period Weeks   Status On-going     OT LONG TERM GOAL #6   Title Pt will complete table top scanning activity (not language based) efficiently with 100% accuracy   Time 12   Period Weeks   Status Revised     OT LONG TERM GOAL #7   Title Pt will complete environmental scanning tasks efficiently with 100% accuracy    Time 12   Period Weeks   Status On-going     OT LONG TERM GOAL #8   Title Pt will perform simple divided attention with environmental scanning for improved safety for ambulation/in prep for driving with at least 75% accuracy.    Baseline Pt. will demonstrate visual compensatory strategies 100% of the time during ADLs, and IADLs.    Time 12   Period Weeks   Status On-going               Plan - 07/11/17 1111    Clinical Impression Statement Pt. reports driving to [place] with his wife, and reports almost turning in too close to a car. Pt. reports his wife was concerned. Pt. reports his wife will only let him drive short distances. Pt. requires increased verbal cues to navigate a monthly calendar, and identify items that are wrong with the calendar, as well as navigating schedules. Pt. continues to work on visual scanning, visual search strategies, visual, and cognitive compensatory strategies during ADLs, and IADLs.   Occupational performance deficits (Please refer to evaluation for details): ADL's;IADL's   Rehab Potential Good   Current Impairments/barriers affecting progress: decr awareness   OT Frequency 2x / week   OT Duration 12 weeks   OT Treatment/Interventions Self-care/ADL training;Electrical Stimulation;Therapeutic exercise;Neuromuscular education;DME and/or AE instruction;Energy conservation;Therapist, nutritional;Therapeutic activities;Cognitive remediation/compensation;Visual/perceptual remediation/compensation;Patient/family education;Balance training   Consulted and Agree with Plan of Care Patient      Patient will benefit from skilled therapeutic intervention in order to improve the following deficits and impairments:  Decreased balance, Decreased cognition, Decreased coordination, Decreased safety awareness, Decreased mobility, Decreased knowledge of use of DME, Impaired UE functional use, Impaired vision/preception  Visit Diagnosis: Muscle weakness (generalized)  Other symptoms and signs involving cognitive functions following cerebral infarction  Hemianopsia    Problem List Patient Active Problem List   Diagnosis Date Noted  . Stenosis of right carotid artery 02/01/2017  .  Acute embolic stroke ([place]) 17/71/1657  . Bradycardia 11/10/2015  . Essential tremor 06/03/2015  . Dizziness 08/21/2012  . Syncope 04/10/2011  . CALF PAIN, LEFT 10/11/2010  . DYSPEPSIA 07/31/2010  . CHEST DISCOMFORT 07/31/2010  . Hyperlipidemia 05/16/2010  . HYPERTENSION, BENIGN 05/16/2010  . CAD, NATIVE VESSEL 05/16/2010    Harrel Carina, MS ,OTR/L 07/11/2017, 11:28 AM  [place] MAIN [facility] SERVICES [address] [place], [place], [address] Phone: [phone]   Fax:  [phone]  Name: Chanan Detwiler [facility]. MRN: [id number] Date of Birth: November 14, 1933

## 2017-07-15 ENCOUNTER — Ambulatory Visit (INDEPENDENT_AMBULATORY_CARE_PROVIDER_SITE_OTHER): Payer: Medicare Other | Admitting: *Deleted

## 2017-07-15 DIAGNOSIS — I639 Cerebral infarction, unspecified: Secondary | ICD-10-CM

## 2017-07-16 ENCOUNTER — Encounter: Payer: Self-pay | Admitting: Speech Pathology

## 2017-07-16 ENCOUNTER — Ambulatory Visit: Payer: Medicare Other | Admitting: Speech Pathology

## 2017-07-16 ENCOUNTER — Ambulatory Visit: Payer: Medicare Other | Admitting: Occupational Therapy

## 2017-07-16 DIAGNOSIS — H5347 Heteronymous bilateral field defects: Secondary | ICD-10-CM

## 2017-07-16 DIAGNOSIS — R41841 Cognitive communication deficit: Secondary | ICD-10-CM

## 2017-07-16 DIAGNOSIS — M6281 Muscle weakness (generalized): Secondary | ICD-10-CM | POA: Diagnosis not present

## 2017-07-16 DIAGNOSIS — R4701 Aphasia: Secondary | ICD-10-CM

## 2017-07-16 NOTE — Therapy (Signed)
[place] MAIN [facility] SERVICES [address] [place], [place], [address] Phone: [phone]   Fax:  [phone]  Occupational Therapy Treatment  Patient Details  Name: [name]. MRN: [id number] Date of Birth: 1933-09-08 Referring Provider: Dr. Rea College  Encounter Date: 07/16/2017      OT End of Session - 07/16/17 1108    Visit Number 34   Number of Visits 3   Date for OT Re-Evaluation 08/06/17   Authorization Type medicare G-code 4   OT Start Time 1102   OT Stop Time 1145   OT Time Calculation (min) 43 min   Activity Tolerance Patient tolerated treatment well   Behavior During Therapy WFL for tasks assessed/performed      Past Medical History:  Diagnosis Date  . BPH (benign prostatic hyperplasia)   . Coronary artery disease   . Hypertension     Past Surgical History:  Procedure Laterality Date  . Intestinal Blockage    . LOOP RECORDER INSERTION N/A 05/14/2017   Procedure: Loop Recorder Insertion;  Surgeon: [name], MD;  Location: [place] [facility];  Service: Cardiovascular;  Laterality: N/A;  . SKIN CANCER EXCISION     x2  . TONSILLECTOMY      There were no vitals filed for this visit.      Subjective Assessment - 07/16/17 1106    Subjective  Pt. reports his weekend was uneventful.   Pertinent History see epic.  L CVA, R hemiopsia, Parkinson's, essential tremor   Patient Stated Goals I want to get well and be perfect again. I hate these questions (pt unable to give specific answer)   Currently in Pain? No/denies     OT TREATMENT    Selfcare:  Pt. worked on visual scanning, visual search strategies, and visual compensatory strategies for tabletop cognitive IADL tasks including: navigating schedules, navigating complex detour maps, and copying telephone numbers while increasing with width of the scanning field. Pt. Requires verbal cues, and increased time to complete, however is improving  accuracy.                            OT Education - 07/16/17 1107    Education provided Yes   Education Details visual compensatory strategies   Person(s) Educated Patient   Methods Explanation   Comprehension Verbalized understanding          OT Short Term Goals - 02/14/17 1457      OT SHORT TERM GOAL #1   Title Pt and wife will be mod I with HEP - 02/21/2017   Time 4   Period Weeks   Status On-going  needs reinforcement of coordination HEP     OT SHORT TERM GOAL #2   Title Pt will be mod I with shower transfers   Status Achieved  met per pt report     OT SHORT TERM GOAL #3   Title Pt will be mod I with bathing   Status Achieved     OT SHORT TERM GOAL #4   Title Pt will complete table top scanning activity (not lanaguage or letter based) with 75% accuracy   Status Achieved  met for copying small peg design with min v.c.     OT SHORT TERM GOAL #5   Title Pt will attend to familiar task in busy environment with no more than min vc's.     Status Achieved  OT Long Term Goals - 07/01/17 1104      OT LONG TERM GOAL #1   Title Pt and wife will be mod I with ugraded HEP prn - 07/01/2017   Time 12   Period Weeks   Status On-going     OT LONG TERM GOAL #2   Time --     OT LONG TERM GOAL #5   Title Pt will require no more than 2 vc's to recall information from therapy session   Time 12   Period Weeks   Status On-going     OT LONG TERM GOAL #6   Title Pt will complete table top scanning activity (not language based) efficiently with 100% accuracy   Time 12   Period Weeks   Status Revised     OT LONG TERM GOAL #7   Title Pt will complete environmental scanning tasks efficiently with 100% accuracy    Time 12   Period Weeks   Status On-going     OT LONG TERM GOAL #8   Title Pt will perform simple divided attention with environmental scanning for improved safety for ambulation/in prep for driving with at least 75% accuracy.    Baseline Pt. will demonstrate visual compensatory strategies 100% of the time during ADLs, and IADLs.    Time 12   Period Weeks   Status On-going               Plan - 07/16/17 1108    Clinical Impression Statement Pt. reports he went to the eye doctor at the end of last week because his eyes were fluttering. Pt. reports his vision cleared, and he was sent home. He reports he did not have any flashes of light.  Pt. worked on visual scanning left to right for tabletop IADL tasks navigating detour maps, and event schedules. Pt. continues to work on improving Cognitive IADL tasks, and visual compensatory strategies for improved ADL, and IADL functioning.   Occupational performance deficits (Please refer to evaluation for details): ADL's;IADL's   OT Frequency 2x / week   OT Duration 12 weeks   OT Treatment/Interventions Self-care/ADL training;Electrical Stimulation;Therapeutic exercise;Neuromuscular education;DME and/or AE instruction;Energy conservation;Therapist, nutritional;Therapeutic activities;Cognitive remediation/compensation;Visual/perceptual remediation/compensation;Patient/family education;Balance training   Consulted and Agree with Plan of Care Patient      Patient will benefit from skilled therapeutic intervention in order to improve the following deficits and impairments:     Visit Diagnosis: Hemianopsia    Problem List Patient Active Problem List   Diagnosis Date Noted  . Stenosis of right carotid artery 02/01/2017  . Acute embolic stroke ([place]) 09/98/3382  . Bradycardia 11/10/2015  . Essential tremor 06/03/2015  . Dizziness 08/21/2012  . Syncope 04/10/2011  . CALF PAIN, LEFT 10/11/2010  . DYSPEPSIA 07/31/2010  . CHEST DISCOMFORT 07/31/2010  . Hyperlipidemia 05/16/2010  . HYPERTENSION, BENIGN 05/16/2010  . CAD, NATIVE VESSEL 05/16/2010    Harrel Carina, MS, OTR/L 07/16/2017, 11:30 AM  [place] MAIN [facility]  SERVICES [address] [place], [place], [address] Phone: [phone]   Fax:  [phone]  Name: [name] [facility]. MRN: [id number] Date of Birth: [birth year]

## 2017-07-16 NOTE — Therapy (Signed)
[place] MAIN [facility] SERVICES [address] [place], [place], [address] Phone: [phone]   Fax:  [phone]  Speech Language Pathology Treatment  Patient Details  Name: [name]. MRN: [id number] Date of Birth: [birth year] Referring Provider: Launa Grill, MD  Encounter Date: 07/16/2017      End of Session - 07/16/17 1321    Visit Number 38   Number of Visits 45   Date for SLP Re-Evaluation 07/29/17   SLP Start Time 1000   SLP Stop Time  1051   SLP Time Calculation (min) 51 min   Activity Tolerance Patient tolerated treatment well      Past Medical History:  Diagnosis Date  . BPH (benign prostatic hyperplasia)   . Coronary artery disease   . Hypertension     Past Surgical History:  Procedure Laterality Date  . Intestinal Blockage    . LOOP RECORDER INSERTION N/A 05/14/2017   Procedure: Loop Recorder Insertion;  Surgeon: [name], MD;  Location: [place] [facility];  Service: Cardiovascular;  Laterality: N/A;  . SKIN CANCER EXCISION     x2  . TONSILLECTOMY      There were no vitals filed for this visit.      Subjective Assessment - 07/16/17 1321    Subjective The patient exhibits frustration when encountering difficulty with relatively easy task   Currently in Pain? No/denies               ADULT SLP TREATMENT - 07/16/17 0001      General Information   Behavior/Cognition Alert;Cooperative;Pleasant mood     Treatment Provided   Treatment provided Cognitive-Linquistic     Pain Assessment   Pain Assessment No/denies pain     Cognitive-Linquistic Treatment   Treatment focused on Aphasia   Skilled Treatment LANGUAGE/WORD FINDING: identify word in list of 5 that does not belong with 100% accuracy and verbalize reason with mod cues to label category.   List 5 items in a concrete category with min cues.  Add one item to a category with 60% accuracy independently and 100% with min cues.   Complete simple verbal analogies with 80% accuracy.  NUMBERS: Patient able to write numbers to dictation and dictate numbers with  80% accuracy.     Assessment / Recommendations / Plan   Plan Continue with current plan of care     Progression Toward Goals   Progression toward goals Progressing toward goals          SLP Education - 07/16/17 1321    Education provided Yes   Education Details word finding strategies   Person(s) Educated Patient   Methods Explanation   Comprehension Verbalized understanding          SLP Short Term Goals - 04/01/17 1015      SLP SHORT TERM GOAL #1   Baseline 04/01/17 - avg 8          SLP Long Term Goals - 05/29/17 1144      SLP LONG TERM GOAL #1   Title pt will use compensations for functional 10 minute mod-max complex conversation   Time 5   Period Weeks   Status Partially Met     SLP LONG TERM GOAL #2   Title  pt will demo [facility] tasks requiring memory compensations (meds, appointments, etc)   Time 6   Period Weeks   Status Achieved     SLP LONG TERM GOAL #3   Title pt  will demo functional reading comprehension in 3-4 sentence paragraphs   Time 6   Period Weeks   Status Achieved     SLP LONG TERM GOAL #4   Title Patient will generate grammatical and meaningful sentences to complete moderate cognitive-linguistic task with 80% accruacy.   Time 7   Period Weeks   Status Partially Met     SLP LONG TERM GOAL #5   Title Patient will complete high level word finding activities with 80% accuracy.   Time 7   Period Weeks   Status Partially Met          Plan - 07/16/17 1322    Clinical Impression Statement The patient is able to self-cue for word finding.  The patient continues to express frustration that it takes him longer to complete relatively simple language tasks.  Patient demonstrates fewer language errors including improper word usage, reduced flexibility for multiple meanings, and vague language.      Speech Therapy  Frequency 2x / week   Duration Other (comment)   Treatment/Interventions Language facilitation;Cueing hierarchy;SLP instruction and feedback;Cognitive reorganization   Potential to Achieve Goals Good   SLP Home Exercise Plan synonyms/antonyms   Consulted and Agree with Plan of Care Patient      Patient will benefit from skilled therapeutic intervention in order to improve the following deficits and impairments:   Aphasia  Cognitive communication deficit    Problem List Patient Active Problem List   Diagnosis Date Noted  . Stenosis of right carotid artery 02/01/2017  . Acute embolic stroke ([place]) 15/37/9432  . Bradycardia 11/10/2015  . Essential tremor 06/03/2015  . Dizziness 08/21/2012  . Syncope 04/10/2011  . CALF PAIN, LEFT 10/11/2010  . DYSPEPSIA 07/31/2010  . CHEST DISCOMFORT 07/31/2010  . Hyperlipidemia 05/16/2010  . HYPERTENSION, BENIGN 05/16/2010  . CAD, NATIVE VESSEL 05/16/2010   Leroy Sea, MS/CCC- SLP  Lou Miner 07/16/2017, Edson Snowball PM  [place] MAIN [facility] SERVICES [address] [place], [place], [address] Phone: [phone]   Fax:  [phone]   Name: Calvin Matlack [facility]. MRN: [id number] Date of Birth: 1933/09/05

## 2017-07-18 ENCOUNTER — Ambulatory Visit: Payer: Medicare Other | Admitting: Speech Pathology

## 2017-07-18 ENCOUNTER — Ambulatory Visit: Payer: Medicare Other | Admitting: Occupational Therapy

## 2017-07-20 LAB — CUP PACEART REMOTE DEVICE CHECK
Date Time Interrogation Session: 20180811224358
Implantable Pulse Generator Implant Date: 20180612

## 2017-07-20 NOTE — Progress Notes (Signed)
Carelink summary report received. Battery status OK. Normal device function. No new symptom episodes, tachy episodes, brady, or pause episodes. No new AF episodes. Monthly summary reports and ROV/PRN 

## 2017-07-23 ENCOUNTER — Ambulatory Visit: Payer: Medicare Other | Admitting: Speech Pathology

## 2017-07-23 ENCOUNTER — Encounter: Payer: Self-pay | Admitting: Speech Pathology

## 2017-07-23 ENCOUNTER — Encounter: Payer: Self-pay | Admitting: Occupational Therapy

## 2017-07-23 ENCOUNTER — Ambulatory Visit: Payer: Medicare Other | Admitting: Occupational Therapy

## 2017-07-23 DIAGNOSIS — H5347 Heteronymous bilateral field defects: Secondary | ICD-10-CM

## 2017-07-23 DIAGNOSIS — R4701 Aphasia: Secondary | ICD-10-CM

## 2017-07-23 DIAGNOSIS — R278 Other lack of coordination: Secondary | ICD-10-CM

## 2017-07-23 DIAGNOSIS — R251 Tremor, unspecified: Secondary | ICD-10-CM

## 2017-07-23 DIAGNOSIS — R41841 Cognitive communication deficit: Secondary | ICD-10-CM

## 2017-07-23 DIAGNOSIS — M6281 Muscle weakness (generalized): Secondary | ICD-10-CM | POA: Diagnosis not present

## 2017-07-23 NOTE — Therapy (Signed)
[place] MAIN [facility] SERVICES [address] [place], [place], [address] Phone: [phone]   Fax:  [phone]  Speech Language Pathology Treatment  Patient Details  Name: [name]. MRN: [id number] Date of Birth: Aug 26, 1933 Referring Provider: Launa Grill, MD  Encounter Date: 07/23/2017      End of Session - 07/23/17 1116    Visit Number 39   Number of Visits 45   Date for SLP Re-Evaluation 07/29/17   SLP Start Time 1000   SLP Stop Time  1050   SLP Time Calculation (min) 50 min   Activity Tolerance Patient tolerated treatment well      Past Medical History:  Diagnosis Date  . BPH (benign prostatic hyperplasia)   . Coronary artery disease   . Hypertension     Past Surgical History:  Procedure Laterality Date  . Intestinal Blockage    . LOOP RECORDER INSERTION N/A 05/14/2017   Procedure: Loop Recorder Insertion;  Surgeon: [name], MD;  Location: [place] [facility];  Service: Cardiovascular;  Laterality: N/A;  . SKIN CANCER EXCISION     x2  . TONSILLECTOMY      There were no vitals filed for this visit.      Subjective Assessment - 07/23/17 1116    Subjective The patient exhibits frustration when encountering difficulty with relatively easy task   Currently in Pain? No/denies               ADULT SLP TREATMENT - 07/23/17 0001      General Information   Behavior/Cognition Alert;Cooperative;Pleasant mood     Treatment Provided   Treatment provided Cognitive-Linquistic     Pain Assessment   Pain Assessment No/denies pain     Cognitive-Linquistic Treatment   Treatment focused on Aphasia   Skilled Treatment LANGUAGE/WORD FINDING: Complete a variety of word finding activities (list items in concrete category, name concrete categories, identify item that doesn't belong and state reason using categories, word deduction, analogies) with overall 80% accuracy independently. NUMBERS: Patient  able to write numbers to dictation and dictate numbers with 80% accuracy.     Assessment / Recommendations / Plan   Plan Continue with current plan of care     Progression Toward Goals   Progression toward goals Progressing toward goals          SLP Education - 07/23/17 1116    Education provided Yes   Education Details word finding strategies   Person(s) Educated Patient   Methods Explanation   Comprehension Verbalized understanding          SLP Short Term Goals - 04/01/17 1015      SLP SHORT TERM GOAL #1   Baseline 04/01/17 - avg 8          SLP Long Term Goals - 05/29/17 1144      SLP LONG TERM GOAL #1   Title pt will use compensations for functional 10 minute mod-max complex conversation   Time 5   Period Weeks   Status Partially Met     SLP LONG TERM GOAL #2   Title  pt will demo [facility] tasks requiring memory compensations (meds, appointments, etc)   Time 6   Period Weeks   Status Achieved     SLP LONG TERM GOAL #3   Title pt will demo functional reading comprehension in 3-4 sentence paragraphs   Time 6   Period Weeks   Status Achieved     SLP LONG  TERM GOAL #4   Title Patient will generate grammatical and meaningful sentences to complete moderate cognitive-linguistic task with 80% accruacy.   Time 7   Period Weeks   Status Partially Met     SLP LONG TERM GOAL #5   Title Patient will complete high level word finding activities with 80% accuracy.   Time 7   Period Weeks   Status Partially Met          Plan - 07/23/17 1119    Clinical Impression Statement The patient is able to self-cue for word finding.  The patient continues to express frustration that it takes him longer to complete relatively simple language tasks.  Patient demonstrates fewer language errors including improper word usage, reduced flexibility for multiple meanings, and vague language.      Speech Therapy Frequency 2x / week   Duration Other (comment)   Treatment/Interventions  Language facilitation;Cueing hierarchy;SLP instruction and feedback;Cognitive reorganization   Potential to Achieve Goals Good   Potential Considerations Ability to learn/carryover information;Cooperation/participation level;Previous level of function;Severity of impairments   SLP Home Exercise Plan numbers/calculation worksheet   Consulted and Agree with Plan of Care Patient      Patient will benefit from skilled therapeutic intervention in order to improve the following deficits and impairments:   Aphasia  Cognitive communication deficit    Problem List Patient Active Problem List   Diagnosis Date Noted  . Stenosis of right carotid artery 02/01/2017  . Acute embolic stroke ([place]) 39/76/7341  . Bradycardia 11/10/2015  . Essential tremor 06/03/2015  . Dizziness 08/21/2012  . Syncope 04/10/2011  . CALF PAIN, LEFT 10/11/2010  . DYSPEPSIA 07/31/2010  . CHEST DISCOMFORT 07/31/2010  . Hyperlipidemia 05/16/2010  . HYPERTENSION, BENIGN 05/16/2010  . CAD, NATIVE VESSEL 05/16/2010   Leroy Sea, MS/CCC- SLP  Lou Miner 07/23/2017, 11:20 AM  [place] MAIN [facility] SERVICES [address] [place], [place], [address] Phone: [phone]   Fax:  [phone]   Name: Tayler Lassen [facility]. MRN: [id number] Date of Birth: November 15, 1933

## 2017-07-23 NOTE — Therapy (Signed)
[place] MAIN [facility] SERVICES [address] [place], [place], [address] Phone: [phone]   Fax:  [phone]  Occupational Therapy Treatment  Patient Details  Name: [name]. MRN: [id number] Date of Birth: January 28, 1933 Referring Provider: Dr. Rea College  Encounter Date: 07/23/2017      OT End of Session - 07/23/17 1009    Visit Number 35   Number of Visits 20   Date for OT Re-Evaluation 08/06/17   Authorization Type medicare G-code 5   OT Start Time 0927   OT Stop Time 1000   OT Time Calculation (min) 33 min   Activity Tolerance Patient tolerated treatment well   Behavior During Therapy [facility] for tasks assessed/performed      Past Medical History:  Diagnosis Date  . BPH (benign prostatic hyperplasia)   . Coronary artery disease   . Hypertension     Past Surgical History:  Procedure Laterality Date  . Intestinal Blockage    . LOOP RECORDER INSERTION N/A 05/14/2017   Procedure: Loop Recorder Insertion;  Surgeon: [name], MD;  Location: [place] [facility];  Service: Cardiovascular;  Laterality: N/A;  . SKIN CANCER EXCISION     x2  . TONSILLECTOMY      There were no vitals filed for this visit.      Subjective Assessment - 07/23/17 0932    Subjective  Patient states, "Sorry I was late".  Patient reports his vision has gotten better over time. Reading is still slow for him and now he is trying to read more difficult books.    Pertinent History see epic.  L CVA, R hemiopsia, Parkinson's, essential tremor   Patient Stated Goals I want to get well and be perfect again. I hate these questions (pt unable to give specific answer)   Currently in Pain? Yes   Pain Score 1    Pain Location Back   Pain Orientation Lower   Pain Descriptors / Indicators Aching   Pain Type Chronic pain   Pain Onset More than a month ago   Pain Frequency Intermittent                      OT Treatments/Exercises  (OP) - 07/23/17 1018      ADLs   ADL Comments Patient seen for scanning exercises for letters, words and for reading.  Cues for detecting items at the end of the rows, missing 3 on average for each template, always on the far right.  Patient seen for handwriting tasks with use of built up pen and then trials of weighted pencils to see if this will help with quality of handwriting.                    OT Short Term Goals - 02/14/17 1457      OT SHORT TERM GOAL #1   Title Pt and wife will be mod I with HEP - 02/21/2017   Time 4   Period Weeks   Status On-going  needs reinforcement of coordination HEP     OT SHORT TERM GOAL #2   Title Pt will be mod I with shower transfers   Status Achieved  met per pt report     OT SHORT TERM GOAL #3   Title Pt will be mod I with bathing   Status Achieved     OT SHORT TERM GOAL #4   Title Pt will complete table top scanning  activity (not lanaguage or letter based) with 75% accuracy   Status Achieved  met for copying small peg design with min v.c.     OT SHORT TERM GOAL #5   Title Pt will attend to familiar task in busy environment with no more than min vc's.     Status Achieved           OT Long Term Goals - 07/01/17 1104      OT LONG TERM GOAL #1   Title Pt and wife will be mod I with ugraded HEP prn - 07/01/2017   Time 12   Period Weeks   Status On-going     OT LONG TERM GOAL #2   Time --     OT LONG TERM GOAL #5   Title Pt will require no more than 2 vc's to recall information from therapy session   Time 12   Period Weeks   Status On-going     OT LONG TERM GOAL #6   Title Pt will complete table top scanning activity (not language based) efficiently with 100% accuracy   Time 12   Period Weeks   Status Revised     OT LONG TERM GOAL #7   Title Pt will complete environmental scanning tasks efficiently with 100% accuracy    Time 12   Period Weeks   Status On-going     OT LONG TERM GOAL #8   Title Pt will perform  simple divided attention with environmental scanning for improved safety for ambulation/in prep for driving with at least 75% accuracy.   Baseline Pt. will demonstrate visual compensatory strategies 100% of the time during ADLs, and IADLs.    Time 12   Period Weeks   Status On-going               Plan - 07/23/17 1009    Clinical Impression Statement Patient requires cues to go back and rescan areas noted by therapist, on the far right, especially if patient is scanning quickly.  Patient tends to miss items at the end of the rows and to the far right.  Handwriting is shaky with tremors noted and patient was not able to read 2 of 20 words he wrote himself.  Added weight to pencil to see if this would change the quality.  Patient with slight improved quality of writing with use of weighted pencil however will need to utilize more to determine the changes. Patient remains slow with reading especially with more challenging text.  Continue to work towards goals to improve visional compensation, safety and self care tasks.    Rehab Potential Good   OT Frequency 2x / week   OT Duration 12 weeks   OT Treatment/Interventions Self-care/ADL training;Electrical Stimulation;Therapeutic exercise;Neuromuscular education;DME and/or AE instruction;Energy conservation;Therapist, nutritional;Therapeutic activities;Cognitive remediation/compensation;Visual/perceptual remediation/compensation;Patient/family education;Balance training   Consulted and Agree with Plan of Care Patient      Patient will benefit from skilled therapeutic intervention in order to improve the following deficits and impairments:  Decreased balance, Decreased cognition, Decreased coordination, Decreased safety awareness, Decreased mobility, Decreased knowledge of use of DME, Impaired UE functional use, Impaired vision/preception  Visit Diagnosis: Hemianopsia  Tremor  Other lack of coordination    Problem List Patient  Active Problem List   Diagnosis Date Noted  . Stenosis of right carotid artery 02/01/2017  . Acute embolic stroke ([place]) 00/17/4944  . Bradycardia 11/10/2015  . Essential tremor 06/03/2015  . Dizziness 08/21/2012  . Syncope 04/10/2011  . CALF  PAIN, LEFT 10/11/2010  . DYSPEPSIA 07/31/2010  . CHEST DISCOMFORT 07/31/2010  . Hyperlipidemia 05/16/2010  . HYPERTENSION, BENIGN 05/16/2010  . CAD, NATIVE VESSEL 05/16/2010   Barnard Sharps T Andres Bantz, OTR/L, CLT  Guillermo Difrancesco 07/23/2017, 10:24 AM  [place] MAIN [facility] SERVICES [address] [place], [place], [address] Phone: [phone]   Fax:  [phone]  Name: [name] [facility]. MRN: [id number] Date of Birth: [birth year]

## 2017-07-25 ENCOUNTER — Ambulatory Visit: Payer: Medicare Other | Admitting: Speech Pathology

## 2017-07-25 ENCOUNTER — Encounter: Payer: Self-pay | Admitting: Speech Pathology

## 2017-07-25 DIAGNOSIS — R4701 Aphasia: Secondary | ICD-10-CM

## 2017-07-25 DIAGNOSIS — M6281 Muscle weakness (generalized): Secondary | ICD-10-CM | POA: Diagnosis not present

## 2017-07-25 NOTE — Therapy (Signed)
[place] MAIN [facility] SERVICES [address] [place], [place], [address] Phone: [phone]   Fax:  [phone]  Speech Language Pathology Treatment/Re-Certification  Patient Details  Name: [name]. MRN: [id number] Date of Birth: 09/22/1933 Referring Provider: Launa Grill, MD  Encounter Date: 07/25/2017      End of Session - 07/25/17 1313    Visit Number 40   Number of Visits 45   Date for SLP Re-Evaluation 07/29/17   SLP Start Time 1003   SLP Stop Time  1100   SLP Time Calculation (min) 57 min   Activity Tolerance Patient tolerated treatment well      Past Medical History:  Diagnosis Date  . BPH (benign prostatic hyperplasia)   . Coronary artery disease   . Hypertension     Past Surgical History:  Procedure Laterality Date  . Intestinal Blockage    . LOOP RECORDER INSERTION N/A 05/14/2017   Procedure: Loop Recorder Insertion;  Surgeon: [name], MD;  Location: [place] [facility];  Service: Cardiovascular;  Laterality: N/A;  . SKIN CANCER EXCISION     x2  . TONSILLECTOMY      There were no vitals filed for this visit.      Subjective Assessment - 07/25/17 1311    Subjective "I still need to work on this" referring to self expression   Currently in Pain? No/denies               ADULT SLP TREATMENT - 07/25/17 0001      General Information   Behavior/Cognition Alert;Cooperative;Pleasant mood     Treatment Provided   Treatment provided Cognitive-Linquistic     Pain Assessment   Pain Assessment No/denies pain     Cognitive-Linquistic Treatment   Treatment focused on Aphasia   Skilled Treatment LANGUAGE/WORD FINDING: Complete a variety of word finding activities (list items in concrete category, name concrete categories, identify item that doesn't belong and state reason using categories, word deduction, analogies) with overall 80% accuracy independently. Answer open-ended questions  with min-mod cues to clarify or amplify response.  NUMBERS: Patient completed serial math worksheet independently and accurately- "I had to take my time".     Assessment / Recommendations / Plan   Plan Continue with current plan of care     Progression Toward Goals   Progression toward goals Progressing toward goals          SLP Education - 07/25/17 1311    Education provided Yes   Education Details word finding strategies   Person(s) Educated Patient   Methods Explanation   Comprehension Verbalized understanding          SLP Short Term Goals - 04/01/17 1015      SLP SHORT TERM GOAL #1   Baseline 04/01/17 - avg 8          SLP Long Term Goals - 07/25/17 1314      SLP LONG TERM GOAL #4   Title Patient will generate grammatical and meaningful sentences to complete moderate cognitive-linguistic task with 80% accruacy.   Time 8   Period Weeks   Status Partially Met   Target Date 09/24/17     SLP LONG TERM GOAL #5   Title Patient will complete high level word finding activities with 80% accuracy.   Time 8   Period Weeks   Status Partially Met   Target Date 09/24/17          Plan - 07/25/17 1313  Clinical Impression Statement The patient is improving in specific word finding tasks and demonstrates improved spontaneous speech information content.  The patient continues to express frustration that it takes him longer to complete these relatively simple language tasks.  Overall, the patient demonstrates fewer language errors including improper word usage, reduced flexibility for multiple meanings, and vague language.  The patient is eager to continue ST and can be expected to continue to make gains in self-expression.    Speech Therapy Frequency 2x / week   Duration Other (comment)  8 weeks   Treatment/Interventions Language facilitation;Cueing hierarchy;SLP instruction and feedback;Cognitive reorganization   Potential to Achieve Goals Good   Potential Considerations  Ability to learn/carryover information;Cooperation/participation level;Previous level of function;Severity of impairments   SLP Home Exercise Plan verbal analogies   Consulted and Agree with Plan of Care Patient      Patient will benefit from skilled therapeutic intervention in order to improve the following deficits and impairments:   Aphasia - Plan: SLP plan of care cert/re-cert      G-Codes - 62/22/97 1319    Functional Assessment Tool Used therapeutic language tasks, clinical judgment   Functional Limitations Spoken language expressive   Spoken Language Expression Current Status [phone]) At least 20 percent but less than 40 percent impaired, limited or restricted   Spoken Language Expression Goal Status ([id number]) At least 1 percent but less than 20 percent impaired, limited or restricted      Problem List Patient Active Problem List   Diagnosis Date Noted  . Stenosis of right carotid artery 02/01/2017  . Acute embolic stroke ([place]) 40/81/4481  . Bradycardia 11/10/2015  . Essential tremor 06/03/2015  . Dizziness 08/21/2012  . Syncope 04/10/2011  . CALF PAIN, LEFT 10/11/2010  . DYSPEPSIA 07/31/2010  . CHEST DISCOMFORT 07/31/2010  . Hyperlipidemia 05/16/2010  . HYPERTENSION, BENIGN 05/16/2010  . CAD, NATIVE VESSEL 05/16/2010   Leroy Sea, MS/CCC- SLP  Lou Miner 07/25/2017, Edson Snowball PM  [place] MAIN [facility] SERVICES [address] [place], [place], [address] Phone: [phone]   Fax:  [phone]   Name: [name] [facility]. MRN: [id number] Date of Birth: Jul 03, 1933

## 2017-07-30 ENCOUNTER — Ambulatory Visit: Payer: Medicare Other | Admitting: Speech Pathology

## 2017-07-30 ENCOUNTER — Ambulatory Visit: Payer: Medicare Other | Admitting: Occupational Therapy

## 2017-07-30 DIAGNOSIS — H5347 Heteronymous bilateral field defects: Secondary | ICD-10-CM

## 2017-07-30 DIAGNOSIS — R4701 Aphasia: Secondary | ICD-10-CM

## 2017-07-30 DIAGNOSIS — M6281 Muscle weakness (generalized): Secondary | ICD-10-CM | POA: Diagnosis not present

## 2017-07-30 NOTE — Therapy (Signed)
[place] MAIN [facility] SERVICES [address] [place], [place], [address] Phone: [phone]   Fax:  [phone]  Occupational Therapy Treatment  Patient Details  Name: [name]. MRN: [id number] Date of Birth: 1933-02-12 Referring Provider: Dr. Rea College  Encounter Date: 07/30/2017      OT End of Session - 07/30/17 1030    Visit Number 36   Number of Visits 19   Date for OT Re-Evaluation 08/06/17   Authorization Type medicare G-code 6   Activity Tolerance Patient tolerated treatment well   Behavior During Therapy [facility] for tasks assessed/performed      Past Medical History:  Diagnosis Date  . BPH (benign prostatic hyperplasia)   . Coronary artery disease   . Hypertension     Past Surgical History:  Procedure Laterality Date  . Intestinal Blockage    . LOOP RECORDER INSERTION N/A 05/14/2017   Procedure: Loop Recorder Insertion;  Surgeon: [name], MD;  Location: [place] [facility];  Service: Cardiovascular;  Laterality: N/A;  . SKIN CANCER EXCISION     x2  . TONSILLECTOMY      There were no vitals filed for this visit.      Subjective Assessment - 07/30/17 1029    Subjective  Pt. reports having mild back pain today.   Patient is accompained by: Family member   Pertinent History see epic.  L CVA, R hemiopsia, Parkinson's, essential tremor   Patient Stated Goals I want to get well and be perfect again. I hate these questions (pt unable to give specific answer)   Currently in Pain? Yes   Pain Score 3    Pain Location Back   Pain Orientation Lower      OT TREATMENT    Selfcare:  Pt. worked on visual scanning through complex schedules, and 2 and 3 letter word searches in [place]. Pt. has difficulty with the 2 letter searches, requiring increased time to complete.                           OT Education - 07/30/17 1030    Education provided Yes   Education Details Visual scanning  to the right, visual compensatory strategies   Person(s) Educated Patient   Methods Explanation   Comprehension Verbalized understanding          OT Short Term Goals - 02/14/17 1457      OT SHORT TERM GOAL #1   Title Pt and wife will be mod I with HEP - 02/21/2017   Time 4   Period Weeks   Status On-going  needs reinforcement of coordination HEP     OT SHORT TERM GOAL #2   Title Pt will be mod I with shower transfers   Status Achieved  met per pt report     OT SHORT TERM GOAL #3   Title Pt will be mod I with bathing   Status Achieved     OT SHORT TERM GOAL #4   Title Pt will complete table top scanning activity (not lanaguage or letter based) with 75% accuracy   Status Achieved  met for copying small peg design with min v.c.     OT SHORT TERM GOAL #5   Title Pt will attend to familiar task in busy environment with no more than min vc's.     Status Achieved           OT Long Term  Goals - 07/01/17 1104      OT LONG TERM GOAL #1   Title Pt and wife will be mod I with ugraded HEP prn - 07/01/2017   Time 12   Period Weeks   Status On-going     OT LONG TERM GOAL #2   Time --     OT LONG TERM GOAL #5   Title Pt will require no more than 2 vc's to recall information from therapy session   Time 12   Period Weeks   Status On-going     OT LONG TERM GOAL #6   Title Pt will complete table top scanning activity (not language based) efficiently with 100% accuracy   Time 12   Period Weeks   Status Revised     OT LONG TERM GOAL #7   Title Pt will complete environmental scanning tasks efficiently with 100% accuracy    Time 12   Period Weeks   Status On-going     OT LONG TERM GOAL #8   Title Pt will perform simple divided attention with environmental scanning for improved safety for ambulation/in prep for driving with at least 75% accuracy.   Baseline Pt. will demonstrate visual compensatory strategies 100% of the time during ADLs, and IADLs.    Time 12   Period  Weeks   Status On-going               Plan - 07/30/17 1030    Clinical Impression Statement Pt. is improving with visual scanning within his environment with improved speed, and accuracy. Pt. continues to require cues and assist for visual scanning to the right secondary to multiple omissions, and misidentifications on the right. Pt. continues to work on improving visual compensatory strategies during ADL, and IADL tasks in his near space, and extrapersonal space.     Occupational performance deficits (Please refer to evaluation for details): ADL's;IADL's   Rehab Potential Good   OT Frequency 2x / week   OT Duration 12 weeks   OT Treatment/Interventions Self-care/ADL training;Electrical Stimulation;Therapeutic exercise;Neuromuscular education;DME and/or AE instruction;Energy conservation;Therapist, nutritional;Therapeutic activities;Cognitive remediation/compensation;Visual/perceptual remediation/compensation;Patient/family education;Balance training   Consulted and Agree with Plan of Care Patient      Patient will benefit from skilled therapeutic intervention in order to improve the following deficits and impairments:  Decreased balance, Decreased cognition, Decreased coordination, Decreased safety awareness, Decreased mobility, Decreased knowledge of use of DME, Impaired UE functional use, Impaired vision/preception  Visit Diagnosis: Hemianopsia    Problem List Patient Active Problem List   Diagnosis Date Noted  . Stenosis of right carotid artery 02/01/2017  . Acute embolic stroke ([place]) 61/47/0929  . Bradycardia 11/10/2015  . Essential tremor 06/03/2015  . Dizziness 08/21/2012  . Syncope 04/10/2011  . CALF PAIN, LEFT 10/11/2010  . DYSPEPSIA 07/31/2010  . CHEST DISCOMFORT 07/31/2010  . Hyperlipidemia 05/16/2010  . HYPERTENSION, BENIGN 05/16/2010  . CAD, NATIVE VESSEL 05/16/2010    Harrel Carina, MS, OTR/L 07/30/2017, 11:01 AM  [place] MAIN [facility] SERVICES [address] [place], [place], [address] Phone: [phone]   Fax:  [phone]  Name: Kawhi Diebold [facility]. MRN: [id number] Date of Birth: 17-Nov-1933

## 2017-07-30 NOTE — Progress Notes (Signed)
Cardiology Office Note  Date:  07/31/2017   ID:  [name]., DOB 1933/07/03, MRN [id number]  PCP:  [name], MD   Chief Complaint  Patient presents with  . other    6 month follow up. Meds reviewed by the pt. verbally. "doing well."     HPI:  [name] is a very pleasant 83 year-old gentleman with a history of  coronary artery disease,  stent placed to his proximal RCA in September 2007, taxis stent 3.0 x 24 mm, with stress test 08/2010 and May 2012,  patient of Dr. [name],  previous syncopal episode (Etiology of his spell is uncertain),  50% carotid disease on the right who presents for routine followup of his coronary artery disease,  Tremor, and stroke  Review of his loop monitor shows no significant arrhythmia Reports pulled up in the office today and reviewed with him in detail He denies any tachycardia or palpitations Currently on aspirin, Plavix previously held at [facility]  Reports that he feels 75% of his baseline Still with peripheral vision issues As well as word finding difficulty  Does regular exercise, denies any chest pain or shortness of breath on exertion No significant balance issues Scheduled to follow with neurology for repeat MRI 3 months Denies any new changes neurologically  No EKG performed today  Other past medical history reviewed February 2018 he had acute vision changes, word finding difficulty. He was taken by life flight to [facility] hospital MRI/MRA showing acute stroke Details of scan as below: 1. Acute left parieto-occipital intraparenchymal hematoma with adjacent subarachnoid hemorrhage. This most likely represents hemorrhagic transformation from an embolic infarct given its configuration. No underlying mass. No other findings to suggest amyloid angiopathy. 2. Normal MRA of the head.  He had a CT scan of the neck showing 50% stenosis on the left, 10% on the right  At the time of his stroke he was on aspirin and Plavix.  Plavix was held  echocardiogram  NORMAL LEFT VENTRICULAR SYSTOLIC FUNCTION WITH MILD LVH NORMAL RIGHT VENTRICULAR SYSTOLIC FUNCTION VALVULAR REGURGITATION: TRIVIAL AR, TRIVIAL TR VALVULAR STENOSIS: TRIVIAL AS VERY POOR SOUND TRANSMISSION-DEFINITY CONTRAST USED NO PRIOR STUDY FOR COMPARISON   Prior to his stroke he was started on Sinemet for tremor Wife reports this did not help very much  He had a nuclear Myoview earlier in 2014 that showed no ischemia. He has chronic dizziness when he changes position. This has been a chronic issue and he has learned to "live with it". No significant progression from previous visits.  stress test on Apr 20 2011 which showed no significant ischemia, normal ejection fraction estimated at 69%. There was a very small region of decreased perfusion suggestive of small region of old scar or attenuation artifact in the apical wall. Overall this was a low risk scan. He was able to perform on the treadmill well with a maximum heart rate of 128 beats per minute, exercised for 8-1/2 minutes, peak blood pressure 194/81.    PMH:   has a past medical history of BPH (benign prostatic hyperplasia); Coronary artery disease; and Hypertension.  PSH:    Past Surgical History:  Procedure Laterality Date  . Intestinal Blockage    . LOOP RECORDER INSERTION N/A 05/14/2017   Procedure: Loop Recorder Insertion;  Surgeon: Duke Salvia, MD;  Location: [facility] INVASIVE CV LAB;  Service: Cardiovascular;  Laterality: N/A;  . SKIN CANCER EXCISION     x2  . TONSILLECTOMY  Current Outpatient Prescriptions  Medication Sig Dispense Refill  . aspirin 81 MG tablet Take 81 mg by mouth daily.      Marland Kitchen b complex vitamins tablet Take 1 tablet by mouth daily.    . cholecalciferol (VITAMIN D) 1000 units tablet Take 2,000 Units by mouth daily.    Marland Kitchen ezetimibe (ZETIA) 10 MG tablet Take 1 tablet (10 mg total) by mouth daily. 90 tablet 3  . FLAXSEED, LINSEED, PO Take 2 g by mouth daily.     . folic acid (FOLVITE) 400 MCG tablet Take 400 mcg by mouth daily.    Marland Kitchen lisinopril (PRINIVIL,ZESTRIL) 20 MG tablet Take 10 mg by mouth daily.    . nitroGLYCERIN (NITROSTAT) 0.4 MG SL tablet Place 1 tablet (0.4 mg total) under the tongue every 5 (five) minutes as needed for chest pain. 25 tablet 3  . pregabalin (LYRICA) 150 MG capsule Take 150 mg by mouth daily.     . simvastatin (ZOCOR) 40 MG tablet Take 40 mg by mouth every evening.     . Tamsulosin HCl (FLOMAX) 0.4 MG CAPS Take 0.8 mg by mouth daily.      No current facility-administered medications for this visit.      Allergies:   Codeine and Penicillins   Social History:  The patient  reports that he has never smoked. He has never used smokeless tobacco. He reports that he drinks about 3.6 - 4.8 oz of alcohol per week . He reports that he does not use drugs.   Family History:   family history includes Cancer in his other; Hyperlipidemia in his other; Hypertension in his mother and other; Other in his other.    Review of Systems: Review of Systems  Constitutional: Negative.   Eyes:       Vision deficits  Respiratory: Negative.   Cardiovascular: Negative.   Gastrointestinal: Negative.   Musculoskeletal: Negative.        Gait instability  Neurological: Negative.        Word finding difficulty, peripheral vision deficit  Psychiatric/Behavioral: Negative.   All other systems reviewed and are negative.    PHYSICAL EXAM: VS:  BP 120/62 (BP Location: Left Arm, Patient Position: Sitting, Cuff Size: Normal)   Pulse 61   Ht 5' 10.5" (1.791 m)   Wt 186 lb 15.2 oz (84.8 kg)   BMI 26.45 kg/m  , BMI Body mass index is 26.45 kg/m.  GEN: Well nourished, well developed, in no acute distress  HEENT: normal  Neck: no JVD, carotid bruits, or masses Cardiac: RRR; no murmurs, rubs, or gallops,no edema  Respiratory:  clear to auscultation bilaterally, normal work of breathing GI: soft, nontender, nondistended, + BS MS: no deformity  or atrophy  Skin: warm and dry, no rash Neuro:  Strength and sensation are intact. Full neuro exam not performed Psych: euthymic mood, full affect    Recent Labs: 01/05/2017: ALT 26; BUN 20; Creatinine, Ser 1.11; Hemoglobin 15.5; Platelets 160; Potassium 4.1; Sodium 137    Lipid Panel Lab Results  Component Value Date   CHOL 122 01/23/2010   HDL 33.8 01/23/2010   LDLCALC 69.6 01/23/2010   TRIG 93 01/23/2010      Wt Readings from Last 3 Encounters:  07/31/17 186 lb 15.2 oz (84.8 kg)  05/09/17 190 lb 4 oz (86.3 kg)  02/01/17 184 lb 4 oz (83.6 kg)       ASSESSMENT AND PLAN:   Hyperlipidemia, unspecified hyperlipidemia type - Plan: EKG 12-Lead Cholesterol is at  goal on the current lipid regimen. No changes to the medications were made.  HYPERTENSION, BENIGN - Plan: EKG 12-Lead He is taking lisinopril 10 mg daily, denies any near syncope or orthostasis  Atherosclerosis of native coronary artery of native heart without angina pectoris - Plan: EKG 12-Lead Currently with no symptoms of angina. No further workup at this time.   Syncope, unspecified syncope type - Plan: EKG 12-Lead No recent episodes of syncope or near syncope  Acute embolic stroke (HCC) Loop monitor in place,no arrhythmia in the past 3 months  Bradycardia currently asymptomatic Not on any rate controlling agents  Stenosis of right carotid artery 50% disease on the right  recommended aggressive cholesterol management  Essential tremor Followed by neurology   Total encounter time more than 25 minutes  Greater than 50% was spent in counseling and coordination of care with the patient   Disposition:   F/U  12 months   Orders Placed This Encounter  Procedures  . EKG 12-Lead     Signed, [name] Arbour, M.D., Ph.D. 07/31/2017  [facility] [place], [place] [phone]

## 2017-07-31 ENCOUNTER — Ambulatory Visit (INDEPENDENT_AMBULATORY_CARE_PROVIDER_SITE_OTHER): Payer: Medicare Other | Admitting: Cardiovascular Disease

## 2017-07-31 ENCOUNTER — Encounter: Payer: Self-pay | Admitting: Cardiovascular Disease

## 2017-07-31 ENCOUNTER — Encounter: Payer: Self-pay | Admitting: Speech Pathology

## 2017-07-31 VITALS — BP 120/62 | HR 61 | Ht 70.5 in | Wt 186.9 lb

## 2017-07-31 DIAGNOSIS — I639 Cerebral infarction, unspecified: Secondary | ICD-10-CM

## 2017-07-31 DIAGNOSIS — I25118 Atherosclerotic heart disease of native coronary artery with other forms of angina pectoris: Secondary | ICD-10-CM | POA: Diagnosis not present

## 2017-07-31 DIAGNOSIS — R001 Bradycardia, unspecified: Secondary | ICD-10-CM

## 2017-07-31 DIAGNOSIS — E782 Mixed hyperlipidemia: Secondary | ICD-10-CM | POA: Diagnosis not present

## 2017-07-31 DIAGNOSIS — I10 Essential (primary) hypertension: Secondary | ICD-10-CM | POA: Diagnosis not present

## 2017-07-31 DIAGNOSIS — I6521 Occlusion and stenosis of right carotid artery: Secondary | ICD-10-CM

## 2017-07-31 DIAGNOSIS — I209 Angina pectoris, unspecified: Secondary | ICD-10-CM

## 2017-07-31 NOTE — Therapy (Signed)
[facility] MAIN [facility] SERVICES [address] [place], [place], [address] Phone: [phone]   Fax:  [phone]  Speech Language Pathology Treatment  Patient Details  Name: [name]. MRN: [id number] Date of Birth: 1933/06/28 Referring Provider: Lorretta Harp, MD  Encounter Date: 07/30/2017      End of Session - 07/31/17 0849    Visit Number 41   Number of Visits 61   Date for SLP Re-Evaluation 09/24/17   SLP Start Time 1100   SLP Stop Time  1105   SLP Time Calculation (min) 5 min   Activity Tolerance Patient tolerated treatment well      Past Medical History:  Diagnosis Date  . BPH (benign prostatic hyperplasia)   . Coronary artery disease   . Hypertension     Past Surgical History:  Procedure Laterality Date  . Intestinal Blockage    . LOOP RECORDER INSERTION N/A 05/14/2017   Procedure: Loop Recorder Insertion;  Surgeon: Duke Salvia, MD;  Location: [facility] INVASIVE CV LAB;  Service: Cardiovascular;  Laterality: N/A;  . SKIN CANCER EXCISION     x2  . TONSILLECTOMY      There were no vitals filed for this visit.      Subjective Assessment - 07/31/17 0847    Subjective "I still need to work on this" referring to self expression.  This session lead by student clinican   Currently in Pain? No/denies               ADULT SLP TREATMENT - 07/31/17 0001      General Information   Behavior/Cognition Alert;Cooperative;Pleasant mood     Treatment Provided   Treatment provided Cognitive-Linquistic     Pain Assessment   Pain Assessment No/denies pain     Cognitive-Linquistic Treatment   Treatment focused on Aphasia   Skilled Treatment LANGUAGE/WORD FINDING: Complete a variety of word finding activities (list items in concrete category, name concrete categories, identify item that doesn't belong and state reason using categories, word deduction, analogies) with overall 80% accuracy independently.  Patient  able to relay novel anecdote independently.     Assessment / Recommendations / Plan   Plan Continue with current plan of care     Progression Toward Goals   Progression toward goals Progressing toward goals          SLP Education - 07/31/17 0847    Education provided Yes   Education Details word finding strategies   Person(s) Educated Patient   Methods Explanation   Comprehension Verbalized understanding          SLP Short Term Goals - 04/01/17 1015      SLP SHORT TERM GOAL #1   Baseline 04/01/17 - avg 8          SLP Long Term Goals - 07/25/17 1314      SLP LONG TERM GOAL #4   Title Patient will generate grammatical and meaningful sentences to complete moderate cognitive-linguistic task with 80% accruacy.   Time 8   Period Weeks   Status Partially Met   Target Date 09/24/17     SLP LONG TERM GOAL #5   Title Patient will complete high level word finding activities with 80% accuracy.   Time 8   Period Weeks   Status Partially Met   Target Date 09/24/17          Plan - 07/31/17 0850    Clinical Impression Statement The patient is improving in specific  word finding tasks and demonstrates improved spontaneous speech information content.  The patient continues to express frustration that it takes him longer to complete these relatively simple language tasks.  Overall, the patient demonstrates fewer language errors including improper word usage, reduced flexibility for multiple meanings, and vague language.  The patient is eager to continue ST and can be expected to continue to make gains in self-expression.    Speech Therapy Frequency 2x / week   Duration Other (comment)   Treatment/Interventions Language facilitation;Cueing hierarchy;SLP instruction and feedback;Cognitive reorganization   Potential to Achieve Goals Good   Potential Considerations Ability to learn/carryover information;Cooperation/participation level;Previous level of function;Severity of  impairments   SLP Home Exercise Plan verbal analogies   Consulted and Agree with Plan of Care Patient      Patient will benefit from skilled therapeutic intervention in order to improve the following deficits and impairments:   Aphasia    Problem List Patient Active Problem List   Diagnosis Date Noted  . Stenosis of right carotid artery 02/01/2017  . Acute embolic stroke ([place]) 99/80/6999  . Bradycardia 11/10/2015  . Essential tremor 06/03/2015  . Dizziness 08/21/2012  . Syncope 04/10/2011  . CALF PAIN, LEFT 10/11/2010  . DYSPEPSIA 07/31/2010  . CHEST DISCOMFORT 07/31/2010  . Hyperlipidemia 05/16/2010  . HYPERTENSION, BENIGN 05/16/2010  . CAD, NATIVE VESSEL 05/16/2010   Leroy Sea, MS/CCC- SLP  Lou Miner 07/31/2017, 8:50 AM  [place] MAIN [facility] SERVICES [address] [place], [place], [address] Phone: [phone]   Fax:  [phone]   Name: [name] [facility]. MRN: [id number] Date of Birth: 13-May-1933

## 2017-07-31 NOTE — Patient Instructions (Signed)

## 2017-08-01 ENCOUNTER — Ambulatory Visit: Payer: Medicare Other | Admitting: Speech Pathology

## 2017-08-01 ENCOUNTER — Ambulatory Visit: Payer: Medicare Other | Admitting: Occupational Therapy

## 2017-08-01 DIAGNOSIS — H5347 Heteronymous bilateral field defects: Secondary | ICD-10-CM

## 2017-08-01 DIAGNOSIS — M6281 Muscle weakness (generalized): Secondary | ICD-10-CM | POA: Diagnosis not present

## 2017-08-01 DIAGNOSIS — R4701 Aphasia: Secondary | ICD-10-CM

## 2017-08-01 NOTE — Therapy (Signed)
[place] MAIN [facility] SERVICES [address] [place], [place], [address] Phone: [phone]   Fax:  [phone]  Occupational Therapy Treatment  Patient Details  Name: [name]. MRN: [id number] Date of Birth: 05-Jan-1933 Referring Provider: Dr. Rea College  Encounter Date: 08/01/2017      OT End of Session - 08/01/17 1027    Visit Number 37   Number of Visits 53   Date for OT Re-Evaluation 08/06/17   Authorization Type medicare G-code 7   OT Start Time 1015   OT Stop Time 1100   OT Time Calculation (min) 45 min   Activity Tolerance Patient tolerated treatment well   Behavior During Therapy [facility] for tasks assessed/performed      Past Medical History:  Diagnosis Date  . BPH (benign prostatic hyperplasia)   . Coronary artery disease   . Hypertension     Past Surgical History:  Procedure Laterality Date  . Intestinal Blockage    . LOOP RECORDER INSERTION N/A 05/14/2017   Procedure: Loop Recorder Insertion;  Surgeon: [name], MD;  Location: [place] CV LAB;  Service: Cardiovascular;  Laterality: N/A;  . SKIN CANCER EXCISION     x2  . TONSILLECTOMY      There were no vitals filed for this visit.      Subjective Assessment - 08/01/17 1024    Subjective  Pt. reports he is making progress, however still has work to do.   Patient is accompained by: Family member   Pertinent History see epic.  L CVA, R hemiopsia, Parkinson's, essential tremor   Patient Stated Goals I want to get well and be perfect again. I hate these questions (pt unable to give specific answer)   Currently in Pain? Yes   Pain Score 1    Pain Location Back   Pain Onset More than a month ago      OT TREATMENT    Selfcare:  Pt. worked on visual saccades left to right at the tabletop with a widened scanning field. Pt. had multiple misidentifications, and omissions to the far right. Pt. worked on visual scanning written text at the  tabletop with increased time required. Pt. worked on navigating calendars, and filling in a list of events on a blank calendar. Pt. requires verbal directions at the beginning, and cues for clarification for clarification throughout. Pt. requires verbal cues, and increased time to complete.                           OT Education - 08/01/17 1025    Education provided Yes   Person(s) Educated Patient   Methods Explanation   Comprehension Verbalized understanding          OT Short Term Goals - 02/14/17 1457      OT SHORT TERM GOAL #1   Title Pt and wife will be mod I with HEP - 02/21/2017   Time 4   Period Weeks   Status On-going  needs reinforcement of coordination HEP     OT SHORT TERM GOAL #2   Title Pt will be mod I with shower transfers   Status Achieved  met per pt report     OT SHORT TERM GOAL #3   Title Pt will be mod I with bathing   Status Achieved     OT SHORT TERM GOAL #4   Title Pt will complete table top scanning activity (not lanaguage  or letter based) with 75% accuracy   Status Achieved  met for copying small peg design with min v.c.     OT SHORT TERM GOAL #5   Title Pt will attend to familiar task in busy environment with no more than min vc's.     Status Achieved           OT Long Term Goals - 07/01/17 1104      OT LONG TERM GOAL #1   Title Pt and wife will be mod I with ugraded HEP prn - 07/01/2017   Time 12   Period Weeks   Status On-going     OT LONG TERM GOAL #2   Time --     OT LONG TERM GOAL #5   Title Pt will require no more than 2 vc's to recall information from therapy session   Time 12   Period Weeks   Status On-going     OT LONG TERM GOAL #6   Title Pt will complete table top scanning activity (not language based) efficiently with 100% accuracy   Time 12   Period Weeks   Status Revised     OT LONG TERM GOAL #7   Title Pt will complete environmental scanning tasks efficiently with 100% accuracy    Time  12   Period Weeks   Status On-going     OT LONG TERM GOAL #8   Title Pt will perform simple divided attention with environmental scanning for improved safety for ambulation/in prep for driving with at least 75% accuracy.   Baseline Pt. will demonstrate visual compensatory strategies 100% of the time during ADLs, and IADLs.    Time 12   Period Weeks   Status On-going               Plan - 08/01/17 1027    Clinical Impression Statement Pt. reports he is making progress overall. Pt. reports he has returned to reading books, however it takes him longer because he needs to keep reading over the text to get the full meaning of the written text. Pt. continues to work on improving visual scanning, and visual compensatory strategies for improved ADLs, and IADLs.   Occupational performance deficits (Please refer to evaluation for details): ADL's;IADL's   Rehab Potential Good   OT Frequency 2x / week   OT Duration 12 weeks   OT Treatment/Interventions Self-care/ADL training;Electrical Stimulation;Therapeutic exercise;Neuromuscular education;DME and/or AE instruction;Energy conservation;Therapist, nutritional;Therapeutic activities;Cognitive remediation/compensation;Visual/perceptual remediation/compensation;Patient/family education;Balance training   Consulted and Agree with Plan of Care Patient      Patient will benefit from skilled therapeutic intervention in order to improve the following deficits and impairments:  Decreased balance, Decreased cognition, Decreased coordination, Decreased safety awareness, Decreased mobility, Decreased knowledge of use of DME, Impaired UE functional use, Impaired vision/preception  Visit Diagnosis: Muscle weakness (generalized)  Hemianopsia    Problem List Patient Active Problem List   Diagnosis Date Noted  . Stenosis of right carotid artery 02/01/2017  . Acute embolic stroke ([place]) 56/38/9373  . Bradycardia 11/10/2015  . Essential tremor  06/03/2015  . Dizziness 08/21/2012  . Syncope 04/10/2011  . CALF PAIN, LEFT 10/11/2010  . DYSPEPSIA 07/31/2010  . CHEST DISCOMFORT 07/31/2010  . Hyperlipidemia 05/16/2010  . HYPERTENSION, BENIGN 05/16/2010  . CAD, NATIVE VESSEL 05/16/2010    Harrel Carina, MS, OTR/L 08/01/2017, 10:49 AM  [place] MAIN [facility] SERVICES [address] [place], [place], [address] Phone: [phone]   Fax:  [phone]  Name: [name] [facility]. MRN: [id number] Date of Birth: [birth year]

## 2017-08-02 ENCOUNTER — Encounter: Payer: Self-pay | Admitting: Speech Pathology

## 2017-08-02 NOTE — Therapy (Signed)
[place] MAIN [facility] SERVICES [address] [place], [place], [address] Phone: [phone]   Fax:  [phone]  Speech Language Pathology Treatment  Patient Details  Name: [name]. MRN: [id number] Date of Birth: May 14, 1933 Referring Provider: Launa Grill, MD  Encounter Date: 08/01/2017      End of Session - 08/02/17 1301    Visit Number 42   Number of Visits 12   Date for SLP Re-Evaluation 09/24/17   SLP Start Time 1100   SLP Stop Time  1155   SLP Time Calculation (min) 55 min   Activity Tolerance Patient tolerated treatment well      Past Medical History:  Diagnosis Date  . BPH (benign prostatic hyperplasia)   . Coronary artery disease   . Hypertension     Past Surgical History:  Procedure Laterality Date  . Intestinal Blockage    . LOOP RECORDER INSERTION N/A 05/14/2017   Procedure: Loop Recorder Insertion;  Surgeon: [name], MD;  Location: [place] CV LAB;  Service: Cardiovascular;  Laterality: N/A;  . SKIN CANCER EXCISION     x2  . TONSILLECTOMY      There were no vitals filed for this visit.      Subjective Assessment - 08/02/17 1300    Subjective "I still need to work on this" referring to self expression.  This session lead by student clinican   Currently in Pain? No/denies               ADULT SLP TREATMENT - 08/02/17 0001      General Information   Behavior/Cognition Alert;Cooperative;Pleasant mood     Treatment Provided   Treatment provided Cognitive-Linquistic     Pain Assessment   Pain Assessment No/denies pain     Cognitive-Linquistic Treatment   Treatment focused on Aphasia   Skilled Treatment WORD FINDING: Complete a variety of word finding activities (list items in concrete category, name concrete categories, identify item that doesn't belong and state reason using categories, word deduction, analogies) with overall 80% accuracy independently.  LANGUAGE:  Patient able to generate meaningful and grammatical sentences to give multiple definitions of words given mod cues to clarify and elaborate responses.     Assessment / Recommendations / Plan   Plan Continue with current plan of care     Progression Toward Goals   Progression toward goals Progressing toward goals          SLP Education - 08/02/17 1300    Education provided Yes   Education Details word finding strategies   Person(s) Educated Patient   Methods Explanation   Comprehension Verbalized understanding          SLP Short Term Goals - 04/01/17 1015      SLP SHORT TERM GOAL #1   Baseline 04/01/17 - avg 8          SLP Long Term Goals - 07/25/17 1314      SLP LONG TERM GOAL #4   Title Patient will generate grammatical and meaningful sentences to complete moderate cognitive-linguistic task with 80% accruacy.   Time 8   Period Weeks   Status Partially Met   Target Date 09/24/17     SLP LONG TERM GOAL #5   Title Patient will complete high level word finding activities with 80% accuracy.   Time 8   Period Weeks   Status Partially Met   Target Date 09/24/17  Plan - 08/02/17 1301    Clinical Impression Statement The patient is improving in specific word finding tasks and demonstrates improved spontaneous speech information content.  The patient continues to express frustration that it takes him longer to complete these relatively simple language tasks.  Overall, the patient demonstrates fewer language errors including improper word usage, reduced flexibility for multiple meanings, and vague language.  The patient is eager to continue ST and can be expected to continue to make gains in self-expression.    Speech Therapy Frequency 2x / week   Duration Other (comment)   Treatment/Interventions Language facilitation;Cueing hierarchy;SLP instruction and feedback;Cognitive reorganization   Potential to Achieve Goals Good   Potential Considerations Ability to  learn/carryover information;Cooperation/participation level;Previous level of function;Severity of impairments   Consulted and Agree with Plan of Care Patient      Patient will benefit from skilled therapeutic intervention in order to improve the following deficits and impairments:   Aphasia    Problem List Patient Active Problem List   Diagnosis Date Noted  . Stenosis of right carotid artery 02/01/2017  . Acute embolic stroke ([place]) 76/80/8811  . Bradycardia 11/10/2015  . Essential tremor 06/03/2015  . Dizziness 08/21/2012  . Syncope 04/10/2011  . CALF PAIN, LEFT 10/11/2010  . DYSPEPSIA 07/31/2010  . CHEST DISCOMFORT 07/31/2010  . Hyperlipidemia 05/16/2010  . HYPERTENSION, BENIGN 05/16/2010  . CAD, NATIVE VESSEL 05/16/2010   Leroy Sea, MS/CCC- SLP  Lou Miner 08/02/2017, 1:02 PM  [place] MAIN [facility] SERVICES [address] [place], [place], [address] Phone: [phone]   Fax:  [phone]   Name: Kash Davie [facility]. MRN: [id number] Date of Birth: Feb 03, 1933

## 2017-08-06 ENCOUNTER — Ambulatory Visit: Payer: Medicare Other | Admitting: Occupational Therapy

## 2017-08-06 ENCOUNTER — Ambulatory Visit: Payer: Medicare Other | Attending: Internal Medicine | Admitting: Speech Pathology

## 2017-08-06 DIAGNOSIS — H5347 Heteronymous bilateral field defects: Secondary | ICD-10-CM | POA: Diagnosis present

## 2017-08-06 DIAGNOSIS — M6281 Muscle weakness (generalized): Secondary | ICD-10-CM | POA: Diagnosis present

## 2017-08-06 DIAGNOSIS — R4701 Aphasia: Secondary | ICD-10-CM | POA: Diagnosis present

## 2017-08-06 DIAGNOSIS — R4189 Other symptoms and signs involving cognitive functions and awareness: Secondary | ICD-10-CM | POA: Diagnosis present

## 2017-08-06 DIAGNOSIS — I69318 Other symptoms and signs involving cognitive functions following cerebral infarction: Secondary | ICD-10-CM | POA: Diagnosis present

## 2017-08-06 NOTE — Therapy (Addendum)
[place] MAIN [facility] SERVICES [address] [place], [place], [address] Phone: [phone]   Fax:  [phone]  Occupational Therapy Treatment/Recertification Note  Patient Details  Name: [name]. MRN: [id number] Date of Birth: 01-Jul-1933 Referring Provider: Dr. Rea College  Encounter Date: 08/06/2017      OT End of Session - 08/06/17 1109    Visit Number 38   Number of Visits 73   Date for OT Re-Evaluation 10/29/17   Authorization Type medicare G-code 8   OT Start Time 1100   OT Stop Time 1145   OT Time Calculation (min) 45 min   Activity Tolerance Patient tolerated treatment well   Behavior During Therapy WFL for tasks assessed/performed      Past Medical History:  Diagnosis Date  . BPH (benign prostatic hyperplasia)   . Coronary artery disease   . Hypertension     Past Surgical History:  Procedure Laterality Date  . Intestinal Blockage    . LOOP RECORDER INSERTION N/A 05/14/2017   Procedure: Loop Recorder Insertion;  Surgeon: [name], MD;  Location: [place] [facility];  Service: Cardiovascular;  Laterality: N/A;  . SKIN CANCER EXCISION     x2  . TONSILLECTOMY      There were no vitals filed for this visit.      Subjective Assessment - 08/06/17 1105    Subjective  Pt. reports he is feeling well overall.   Patient is accompained by: Family member   Pertinent History see epic.  L CVA, R hemiopsia, Parkinson's, essential tremor   Currently in Pain? No/denies      OT TREATMENT    Selfcare:  Pt. worked on tabletop visual scanning tasks, visual compensatory strategies, and visual saccades left to right. Pt. requires cues secondary to omissions, and misidentifications to the far right.  Pt. Worked on navigating complex event schedules with visual, and verbal cues.                            OT Education - 08/06/17 1108    Education provided Yes   Education Details Visual  scanning, visual, and compensatory strategies.   Person(s) Educated Patient   Methods Explanation   Comprehension Verbalized understanding          OT Short Term Goals - 02/14/17 1457      OT SHORT TERM GOAL #1   Title Pt and wife will be mod I with HEP - 02/21/2017   Time 4   Period Weeks   Status On-going  needs reinforcement of coordination HEP     OT SHORT TERM GOAL #2   Title Pt will be mod I with shower transfers   Status Achieved  met per pt report     OT SHORT TERM GOAL #3   Title Pt will be mod I with bathing   Status Achieved     OT SHORT TERM GOAL #4   Title Pt will complete table top scanning activity (not lanaguage or letter based) with 75% accuracy   Status Achieved  met for copying small peg design with min v.c.     OT SHORT TERM GOAL #5   Title Pt will attend to familiar task in busy environment with no more than min vc's.     Status Achieved           OT Long Term Goals - 08/06/17 1120  OT LONG TERM GOAL #1   Title Pt and wife will be mod I with ugraded HEP.   Time 12   Period Weeks   Status On-going   Target Date 10/29/17     OT LONG TERM GOAL #5   Title Pt will require no more than 2 vc's to recall information from therapy session   Time 12   Period Weeks   Status On-going   Target Date 10/29/17     OT LONG TERM GOAL #6   Title Pt will complete table top scanning activity (not language based) efficiently with 100% accuracy   Time 12   Period Weeks   Status Revised     OT LONG TERM GOAL #7   Title Pt will complete environmental scanning tasks efficiently with 100% accuracy    Time 12   Period Weeks   Status On-going     OT LONG TERM GOAL #8   Title Pt will perform simple divided attention with environmental scanning for improved safety for ambulation/in prep for driving with at least 75% accuracy.   Baseline Pt. will demonstrate visual compensatory strategies 100% of the time during ADLs, and IADLs.    Time 12   Period  Weeks   Status On-going               Plan - 08/06/17 1110    Clinical Impression Statement Pt. reports he is noticing that it takes him longer in the morning to figure out what he's doing, and what he needs to be doing. Pt. reports it takes him awhile, and eventually he figures it out. Pt. reports his wife can usually read his mind, and helps him figure it out. Pt. reports he doesn't have any trouble driving, and continues to drive himself to therapy.  Pt. Is improving with visual scanning and tabletop tasks, and within his environment, however has difficulty identifying items to to far right. Pt. reports having difficulty reading the text on the television that runs along the bottom. Pt. Has difficulty with completing left to right visual saccades when challenged at various widths. Pt. continues to benefit from skilled OT services to work on improving visual compensatory strategies, visual scanning tasks, Cognitive IADLs, and cognitive compensatory strategies.    Occupational performance deficits (Please refer to evaluation for details): ADL's;IADL's   Rehab Potential Good   OT Frequency 2x / week   OT Duration 12 weeks   OT Treatment/Interventions Self-care/ADL training;Electrical Stimulation;Therapeutic exercise;Neuromuscular education;DME and/or AE instruction;Energy conservation;Therapist, nutritional;Therapeutic activities;Cognitive remediation/compensation;Visual/perceptual remediation/compensation;Patient/family education;Balance training   Consulted and Agree with Plan of Care Patient      Patient will benefit from skilled therapeutic intervention in order to improve the following deficits and impairments:  Decreased balance, Decreased cognition, Decreased coordination, Decreased safety awareness, Decreased mobility, Decreased knowledge of use of DME, Impaired UE functional use, Impaired vision/preception  Visit Diagnosis: Muscle weakness (generalized) - Plan: Ot plan of  care cert/re-cert  Hemianopsia - Plan: Ot plan of care cert/re-cert      G-Codes - 01/60/10 1118/02/18    Functional Assessment Tool Used (Outpatient only) skilled clinical observation   Functional Limitation Self care   Self Care Current Status [phone]) At least 1 percent but less than 20 percent impaired, limited or restricted   Self Care Goal Status ([id number]) 0 percent impaired, limited or restricted      Problem List Patient Active Problem List   Diagnosis Date Noted  . Stenosis of right carotid artery 02/01/2017  .  Acute embolic stroke ([place]) 85/90/9311  . Bradycardia 11/10/2015  . Essential tremor 06/03/2015  . Dizziness 08/21/2012  . Syncope 04/10/2011  . CALF PAIN, LEFT 10/11/2010  . DYSPEPSIA 07/31/2010  . CHEST DISCOMFORT 07/31/2010  . Hyperlipidemia 05/16/2010  . HYPERTENSION, BENIGN 05/16/2010  . CAD, NATIVE VESSEL 05/16/2010    Harrel Carina, MS, OTR/L 08/06/2017, 11:56 AM  [place] MAIN [facility] SERVICES [address] [place], [place], [address] Phone: [phone]   Fax:  [phone]  Name: Calvin Manrique [facility]. MRN: [id number] Date of Birth: 1932-12-30

## 2017-08-07 ENCOUNTER — Telehealth: Payer: Self-pay | Admitting: Cardiovascular Disease

## 2017-08-07 ENCOUNTER — Encounter: Payer: Self-pay | Admitting: Speech Pathology

## 2017-08-07 NOTE — Therapy (Signed)
[place] MAIN [facility] SERVICES [address] [place], [place], [address] Phone: [phone]   Fax:  [phone]  Speech Language Pathology Treatment  Patient Details  Name: [name]. MRN: [id number] Date of Birth: Apr 08, 1933 Referring Provider: Launa Grill, MD  Encounter Date: 08/06/2017      End of Session - 08/07/17 0830    Visit Number 43   Number of Visits 61   Date for SLP Re-Evaluation 09/24/17   SLP Start Time 1100   SLP Stop Time  1151   SLP Time Calculation (min) 51 min   Activity Tolerance Patient tolerated treatment well      Past Medical History:  Diagnosis Date  . BPH (benign prostatic hyperplasia)   . Coronary artery disease   . Hypertension     Past Surgical History:  Procedure Laterality Date  . Intestinal Blockage    . LOOP RECORDER INSERTION N/A 05/14/2017   Procedure: Loop Recorder Insertion;  Surgeon: [name], MD;  Location: [place] [facility];  Service: Cardiovascular;  Laterality: N/A;  . SKIN CANCER EXCISION     x2  . TONSILLECTOMY      There were no vitals filed for this visit.      Subjective Assessment - 08/07/17 0829    Subjective "I went backwards today"  This session was lead by student clinician.   Currently in Pain? No/denies               ADULT SLP TREATMENT - 08/07/17 0001      General Information   Behavior/Cognition Alert;Cooperative;Pleasant mood     Treatment Provided   Treatment provided Cognitive-Linquistic     Pain Assessment   Pain Assessment No/denies pain     Cognitive-Linquistic Treatment   Treatment focused on Aphasia   Skilled Treatment WORD FINDING: Complete a variety of word finding activities (list items in concrete category, add item to concrete categories) with overall 80% accuracy independently.  LANGUAGE: Patient able to generate meaningful and grammatical sentences to give multiple definitions of words given mod cues to clarify  and elaborate responses.     Assessment / Recommendations / Plan   Plan Continue with current plan of care     Progression Toward Goals   Progression toward goals Progressing toward goals          SLP Education - 08/07/17 0830    Education provided Yes   Education Details word finding strategies, clarify vague language   Person(s) Educated Patient   Methods Explanation   Comprehension Verbalized understanding          SLP Short Term Goals - 04/01/17 1015      SLP SHORT TERM GOAL #1   Baseline 04/01/17 - avg 8          SLP Long Term Goals - 07/25/17 1314      SLP LONG TERM GOAL #4   Title Patient will generate grammatical and meaningful sentences to complete moderate cognitive-linguistic task with 80% accruacy.   Time 8   Period Weeks   Status Partially Met   Target Date 09/24/17     SLP LONG TERM GOAL #5   Title Patient will complete high level word finding activities with 80% accuracy.   Time 8   Period Weeks   Status Partially Met   Target Date 09/24/17          Plan - 08/07/17 0831    Clinical Impression Statement The patient is improving  in specific word finding tasks and demonstrates improved spontaneous speech information content.  The patient continues to express frustration that it takes him longer to complete these relatively simple language tasks.  Overall, the patient demonstrates fewer language errors including improper word usage, reduced flexibility for multiple meanings, and vague language.  The patient is eager to continue ST and can be expected to continue to make gains in self-expression.    Speech Therapy Frequency 2x / week   Duration Other (comment)   Treatment/Interventions Language facilitation;Cueing hierarchy;SLP instruction and feedback;Cognitive reorganization   Potential to Achieve Goals Good   Potential Considerations Ability to learn/carryover information;Cooperation/participation level;Previous level of function;Severity of  impairments   Consulted and Agree with Plan of Care Patient      Patient will benefit from skilled therapeutic intervention in order to improve the following deficits and impairments:   Aphasia    Problem List Patient Active Problem List   Diagnosis Date Noted  . Stenosis of right carotid artery 02/01/2017  . Acute embolic stroke ([place]) 16/43/5391  . Bradycardia 11/10/2015  . Essential tremor 06/03/2015  . Dizziness 08/21/2012  . Syncope 04/10/2011  . CALF PAIN, LEFT 10/11/2010  . DYSPEPSIA 07/31/2010  . CHEST DISCOMFORT 07/31/2010  . Hyperlipidemia 05/16/2010  . HYPERTENSION, BENIGN 05/16/2010  . CAD, NATIVE VESSEL 05/16/2010   Leroy Sea, MS/CCC- SLP  Lou Miner 08/07/2017, 8:31 AM  [place] MAIN [facility] SERVICES [address] [place], [place], [address] Phone: [phone]   Fax:  [phone]   Name: [name] [facility]. MRN: [id number] Date of Birth: July 01, 1933

## 2017-08-07 NOTE — Telephone Encounter (Signed)
Patient dropped off forms for [employer] [place] orange claim .  Sent to [employer] via interoffice mail .

## 2017-08-08 ENCOUNTER — Ambulatory Visit: Payer: Medicare Other | Admitting: Speech Pathology

## 2017-08-08 ENCOUNTER — Encounter: Payer: Self-pay | Admitting: Speech Pathology

## 2017-08-08 ENCOUNTER — Ambulatory Visit: Payer: Medicare Other | Admitting: Occupational Therapy

## 2017-08-08 ENCOUNTER — Ambulatory Visit: Payer: Medicare Other | Admitting: Cardiovascular Disease

## 2017-08-08 DIAGNOSIS — I69318 Other symptoms and signs involving cognitive functions following cerebral infarction: Secondary | ICD-10-CM

## 2017-08-08 DIAGNOSIS — R4701 Aphasia: Secondary | ICD-10-CM

## 2017-08-08 DIAGNOSIS — H5347 Heteronymous bilateral field defects: Secondary | ICD-10-CM

## 2017-08-08 NOTE — Therapy (Signed)
[place] MAIN [facility] SERVICES [address] [place], [place], [address] Phone: [phone]   Fax:  [phone]  Speech Language Pathology Treatment  Patient Details  Name: [name]. MRN: [id number] Date of Birth: 14-Feb-1933 Referring Provider: Launa Grill, MD  Encounter Date: 08/08/2017      End of Session - 08/08/17 1555    Visit Number 44   Number of Visits 85   Date for SLP Re-Evaluation 09/24/17   SLP Start Time 1100   SLP Stop Time  0938   SLP Time Calculation (min) 57 min   Activity Tolerance Patient tolerated treatment well      Past Medical History:  Diagnosis Date  . BPH (benign prostatic hyperplasia)   . Coronary artery disease   . Hypertension     Past Surgical History:  Procedure Laterality Date  . Intestinal Blockage    . LOOP RECORDER INSERTION N/A 05/14/2017   Procedure: Loop Recorder Insertion;  Surgeon: [name], MD;  Location: [place] [facility];  Service: Cardiovascular;  Laterality: N/A;  . SKIN CANCER EXCISION     x2  . TONSILLECTOMY      There were no vitals filed for this visit.      Subjective Assessment - 08/08/17 1554    Subjective The patient was more up-beat today.  This session was lead by student clinician.   Currently in Pain? No/denies               ADULT SLP TREATMENT - 08/08/17 0001      General Information   Behavior/Cognition Alert;Cooperative;Pleasant mood     Treatment Provided   Treatment provided Cognitive-Linquistic     Pain Assessment   Pain Assessment No/denies pain     Cognitive-Linquistic Treatment   Treatment focused on Aphasia   Skilled Treatment WORD FINDING: Complete a variety of word finding activities (list items in concrete category, word deduction, and semantic feature analysis) with overall 80% accuracy independently.       Assessment / Recommendations / Plan   Plan Continue with current plan of care     Progression Toward  Goals   Progression toward goals Progressing toward goals          SLP Education - 08/08/17 1554    Education provided Yes   Education Details word retrieval strategies   Person(s) Educated Patient   Methods Explanation   Comprehension Verbalized understanding          SLP Short Term Goals - 04/01/17 1015      SLP SHORT TERM GOAL #1   Baseline 04/01/17 - avg 8          SLP Long Term Goals - 07/25/17 1314      SLP LONG TERM GOAL #4   Title Patient will generate grammatical and meaningful sentences to complete moderate cognitive-linguistic task with 80% accruacy.   Time 8   Period Weeks   Status Partially Met   Target Date 09/24/17     SLP LONG TERM GOAL #5   Title Patient will complete high level word finding activities with 80% accuracy.   Time 8   Period Weeks   Status Partially Met   Target Date 09/24/17          Plan - 08/08/17 1555    Clinical Impression Statement The patient is improving in specific word finding tasks and demonstrates improved spontaneous speech information content.  The patient continues to express frustration that it takes  him longer to complete these relatively simple language tasks.  Overall, the patient demonstrates fewer language errors including improper word usage, reduced flexibility for multiple meanings, and vague language.  The patient is eager to continue ST and can be expected to continue to make gains in self-expression.    Speech Therapy Frequency 2x / week   Duration Other (comment)   Treatment/Interventions Language facilitation;Cueing hierarchy;SLP instruction and feedback;Cognitive reorganization   Potential to Achieve Goals Good   Potential Considerations Ability to learn/carryover information;Cooperation/participation level;Previous level of function;Severity of impairments   Consulted and Agree with Plan of Care Patient      Patient will benefit from skilled therapeutic intervention in order to improve the following  deficits and impairments:   Aphasia    Problem List Patient Active Problem List   Diagnosis Date Noted  . Stenosis of right carotid artery 02/01/2017  . Acute embolic stroke ([place]) 46/95/0722  . Bradycardia 11/10/2015  . Essential tremor 06/03/2015  . Dizziness 08/21/2012  . Syncope 04/10/2011  . CALF PAIN, LEFT 10/11/2010  . DYSPEPSIA 07/31/2010  . CHEST DISCOMFORT 07/31/2010  . Hyperlipidemia 05/16/2010  . HYPERTENSION, BENIGN 05/16/2010  . CAD, NATIVE VESSEL 05/16/2010   Leroy Sea, MS/CCC- SLP  Lou Miner 08/08/2017, 3:56 PM  [place] MAIN [facility] SERVICES [address] [place], [place], [address] Phone: [phone]   Fax:  [phone]   Name: [name] [facility]. MRN: [id number] Date of Birth: 01-31-1933

## 2017-08-08 NOTE — Therapy (Addendum)
[place] [facility] SERVICES [address] [place], [place], [address] Phone: [phone]   Fax:  [phone]  Occupational Therapy Treatment  Patient Details  Name: Calvin AmberGeorge Stanford Kemmer Jr. MRN: [id number] Date of Birth: March 06, 1933 Referring Provider: Dr. Wilfrid LundJason Downs  Encounter Date: 08/08/2017      OT End of Session - 08/08/17 1126    Visit Number 39   Number of Visits 56   Date for OT Re-Evaluation 111/27/2018   Authorization Type medicare G-code 9   OT Start Time 1100   OT Stop Time 1145   OT Time Calculation (min) 45 min   Activity Tolerance Patient tolerated treatment well   Behavior During Therapy WFL for tasks assessed/performed      Past Medical History:  Diagnosis Date  . BPH (benign prostatic hyperplasia)   . Coronary artery disease   . Hypertension     Past Surgical History:  Procedure Laterality Date  . Intestinal Blockage    . LOOP RECORDER INSERTION N/A 05/14/2017   Procedure: Loop Recorder Insertion;  Surgeon: [name], Steven C, MD;  [facility];  Service: Cardiovascular;  Laterality: N/A;  . SKIN CANCER EXCISION     x2  . TONSILLECTOMY      There were no vitals filed for this visit.      Subjective Assessment - 08/08/17 1123    Subjective  Pt. reports they worked him really hard in speech therapy today. Pt. reports it was the hardest session yet.   Patient is accompained by: Family member   Pertinent History see epic.  L CVA, R hemiopsia, Parkinson's, essential tremor   Patient Stated Goals I want to get well and be perfect again. I hate these questions (pt unable to give specific answer)   Currently in Pain? Yes   Pain Score 2    Pain Location Back     OT TREATMENT:    Selfcare:  Pt. worked on visual scanning tasks using a scan course in the hallway, and scanning within the kitchen. Pt. presented with several omissions on the right, however fewer than last session. Pt. worked on  visual saccades left to right for tabletop tasks. Pt. worked on navigating event calendars, and monthly calendars.                             OT Education - 08/08/17 1125    Education provided Yes   Education Details Visual scanning, visual compensatory strategies, home tasks.   Person(s) Educated Patient   Methods Explanation   Comprehension Verbalized understanding            OT Long Term Goals - 08/06/17 1120      OT LONG TERM GOAL #1   Title Pt and wife will be mod I with ugraded HEP.   Time 12   Period Weeks   Status On-going   Target Date 10/29/17     OT LONG TERM GOAL #5   Title Pt will require no more than 2 vc's to recall information from therapy session   Time 12   Period Weeks   Status On-going   Target Date 10/29/17     OT LONG TERM GOAL #6   Title Pt will complete table top scanning activity (not language based) efficiently with 100% accuracy   Time 12   Period Weeks   Status Revised     OT LONG TERM GOAL #7  Title Pt will complete environmental scanning tasks efficiently with 100% accuracy    Time 12   Period Weeks   Status On-going     OT LONG TERM GOAL #8   Title Pt will perform simple divided attention with environmental scanning for improved safety for ambulation/in prep for driving with at least 09% accuracy.   Baseline Pt. will demonstrate visual compensatory strategies 100% of the time during ADLs, and IADLs.    Time 12   Period Weeks   Status On-going               Plan - 08/08/17 1126    Clinical Impression Statement Pt. reports his had a really tough sppech therapy session today, with difficulty word finding. Pt. presents with misidentifications, and omissions to the far right. Pt. is improving with visual scanning, and visual compensatory strategies in his near space during tabletop tasks, and extrapersonal space for tasks within his environment. Pt. continues to work on improving visual compensatory  techniques, druing ADLs/IADLs, and visual scanning tasks.    Rehab Potential Good   OT Frequency 2x / week   OT Treatment/Interventions Self-care/ADL training;Electrical Stimulation;Therapeutic exercise;Neuromuscular education;DME and/or AE instruction;Energy conservation;Building services engineer;Therapeutic activities;Cognitive remediation/compensation;Visual/perceptual remediation/compensation;Patient/family education;Balance training   Consulted and Agree with Plan of Care Patient      Patient will benefit from skilled therapeutic intervention in order to improve the following deficits and impairments:  Decreased balance, Decreased cognition, Decreased coordination, Decreased safety awareness, Decreased mobility, Decreased knowledge of use of DME, Impaired UE functional use, Impaired vision/preception  Visit Diagnosis: Hemianopsia  Other symptoms and signs involving cognitive functions following cerebral infarction    Problem List Patient Active Problem List   Diagnosis Date Noted  . Stenosis of right carotid artery 02/01/2017  . Acute embolic stroke ([facility]) 02/01/2017  . Bradycardia 11/10/2015  . Essential tremor 06/03/2015  . Dizziness 08/21/2012  . Syncope 04/10/2011  . CALF PAIN, LEFT 10/11/2010  . DYSPEPSIA 07/31/2010  . CHEST DISCOMFORT 07/31/2010  . Hyperlipidemia 05/16/2010  . HYPERTENSION, BENIGN 05/16/2010  . CAD, NATIVE VESSEL 05/16/2010    Olegario Messier, MS, OTR/L 08/08/2017, 11:34 AM  [facility] MAIN [facility] SERVICES [address] [place], [place], [address] Phone: [phone]   Fax:  [phone]  Name: [name] [name]. MRN: [id number] Date of Birth: October 26, 1933

## 2017-08-12 ENCOUNTER — Ambulatory Visit (INDEPENDENT_AMBULATORY_CARE_PROVIDER_SITE_OTHER): Payer: Medicare Other | Admitting: *Deleted

## 2017-08-12 DIAGNOSIS — I639 Cerebral infarction, unspecified: Secondary | ICD-10-CM | POA: Diagnosis not present

## 2017-08-12 NOTE — Telephone Encounter (Signed)
Pt dropped off signed release form and $25 check. Scanned into chart and sent to [employer] via interoffice mail

## 2017-08-13 ENCOUNTER — Ambulatory Visit: Payer: Medicare Other | Admitting: *Deleted

## 2017-08-13 ENCOUNTER — Ambulatory Visit: Payer: Medicare Other | Admitting: Speech Pathology

## 2017-08-13 ENCOUNTER — Encounter: Payer: Self-pay | Admitting: Speech Pathology

## 2017-08-13 ENCOUNTER — Ambulatory Visit: Payer: Medicare Other | Admitting: Occupational Therapy

## 2017-08-13 DIAGNOSIS — H5347 Heteronymous bilateral field defects: Secondary | ICD-10-CM

## 2017-08-13 DIAGNOSIS — R4701 Aphasia: Secondary | ICD-10-CM

## 2017-08-13 NOTE — Therapy (Signed)
[place] MAIN [facility] SERVICES [address] [place], [place], [address] Phone: [phone]   Fax:  [phone]  Speech Language Pathology Treatment  Patient Details  Name: [name]. MRN: [id number] Date of Birth: [birth year] Referring Provider: Launa Grill, MD  Encounter Date: 08/13/2017      End of Session - 08/13/17 1506    Visit Number 45   Number of Visits 48   Date for SLP Re-Evaluation 09/24/17   SLP Start Time 1000   SLP Stop Time  1056   SLP Time Calculation (min) 56 min   Activity Tolerance Patient tolerated treatment well      Past Medical History:  Diagnosis Date  . BPH (benign prostatic hyperplasia)   . Coronary artery disease   . Hypertension     Past Surgical History:  Procedure Laterality Date  . Intestinal Blockage    . LOOP RECORDER INSERTION N/A 05/14/2017   Procedure: Loop Recorder Insertion;  Surgeon: [name], MD;  Location: [place] [facility];  Service: Cardiovascular;  Laterality: N/A;  . SKIN CANCER EXCISION     x2  . TONSILLECTOMY      There were no vitals filed for this visit.      Subjective Assessment - 08/13/17 1504    Subjective "I went backwards today"- patient forgot his zip code at the gas pump.  This session was lead by student clinician.   Currently in Pain? No/denies               ADULT SLP TREATMENT - 08/13/17 0001      General Information   Behavior/Cognition Alert;Cooperative;Pleasant mood     Treatment Provided   Treatment provided Cognitive-Linquistic     Pain Assessment   Pain Assessment No/denies pain     Cognitive-Linquistic Treatment   Treatment focused on Aphasia   Skilled Treatment WORD FINDING: Complete a variety of word finding activities (list items in concrete category, word deduction, and semantic feature analysis) with overall 80% accuracy independently.  LANGUAGE: Patient able to generate meaningful and grammatical sentences to  give multiple definitions of words given mod cues to clarify and elaborate responses.     Assessment / Recommendations / Plan   Plan Continue with current plan of care     Progression Toward Goals   Progression toward goals Progressing toward goals          SLP Education - 08/13/17 1505    Education provided Yes   Education Details Need to be patient- may need to be more thoughtful and analytical to complete tasks that formerly were simple.   Person(s) Educated Patient   Methods Explanation   Comprehension Verbalized understanding          SLP Short Term Goals - 04/01/17 1015      SLP SHORT TERM GOAL #1   Baseline 04/01/17 - avg 8          SLP Long Term Goals - 07/25/17 1314      SLP LONG TERM GOAL #4   Title Patient will generate grammatical and meaningful sentences to complete moderate cognitive-linguistic task with 80% accruacy.   Time 8   Period Weeks   Status Partially Met   Target Date 09/24/17     SLP LONG TERM GOAL #5   Title Patient will complete high level word finding activities with 80% accuracy.   Time 8   Period Weeks   Status Partially Met   Target Date 09/24/17  Plan - 08/13/17 1506    Clinical Impression Statement The patient is improving in specific word finding tasks and demonstrates improved spontaneous speech information content.  The patient continues to express frustration that it takes him longer to complete these relatively simple language tasks.  Overall, the patient demonstrates fewer language errors including improper word usage, reduced flexibility for multiple meanings, and vague language.  The patient is eager to continue ST and can be expected to continue to make gains in self-expression.    Speech Therapy Frequency 2x / week   Duration Other (comment)   Treatment/Interventions Language facilitation;Cueing hierarchy;SLP instruction and feedback;Cognitive reorganization   Potential to Achieve Goals Good   Potential  Considerations Ability to learn/carryover information;Cooperation/participation level;Previous level of function;Severity of impairments   Consulted and Agree with Plan of Care Patient      Patient will benefit from skilled therapeutic intervention in order to improve the following deficits and impairments:   Aphasia    Problem List Patient Active Problem List   Diagnosis Date Noted  . Stenosis of right carotid artery 02/01/2017  . Acute embolic stroke ([place]) 33/35/4562  . Bradycardia 11/10/2015  . Essential tremor 06/03/2015  . Dizziness 08/21/2012  . Syncope 04/10/2011  . CALF PAIN, LEFT 10/11/2010  . DYSPEPSIA 07/31/2010  . CHEST DISCOMFORT 07/31/2010  . Hyperlipidemia 05/16/2010  . HYPERTENSION, BENIGN 05/16/2010  . CAD, NATIVE VESSEL 05/16/2010   Leroy Sea, MS/CCC- SLP  Lou Miner 08/13/2017, 3:07 PM  [place] MAIN [facility] SERVICES [address] [place], [place], [address] Phone: [phone]   Fax:  [phone]   Name: [name]. MRN: [id number] Date of Birth: Aug 29, 1933

## 2017-08-13 NOTE — Progress Notes (Signed)
[facility] Summary Report / Loop Recorder 

## 2017-08-13 NOTE — Therapy (Signed)
[place] [facility] MAIN [facility] SERVICES [address] [place], [place], [address] Phone: [phone]   Fax:  [phone]  Occupational Therapy Treatment  Patient Details  Name: Calvin AmberGeorge Stanford Popelka Jr. MRN: [id number] Date of Birth: [birth year] Referring Provider: Dr. Wilfrid LundJason Downs  Encounter Date: 08/13/2017      OT End of Session - 08/13/17 1101    Visit Number 40   Number of Visits 56   Date for OT Re-Evaluation 10/29/17   Authorization Type medicare G-code 10   OT Start Time 1103   OT Stop Time 1145   OT Time Calculation (min) 42 min   Activity Tolerance Patient tolerated treatment well   Behavior During Therapy WFL for tasks assessed/performed      Past Medical History:  Diagnosis Date  . BPH (benign prostatic hyperplasia)   . Coronary artery disease   . Hypertension     Past Surgical History:  Procedure Laterality Date  . Intestinal Blockage    . LOOP RECORDER INSERTION N/A 05/14/2017   Procedure: Loop Recorder Insertion;  Surgeon: [name], Steven C, MD;  [facility];  Service: Cardiovascular;  Laterality: N/A;  . SKIN CANCER EXCISION     x2  . TONSILLECTOMY      There were no vitals filed for this visit.      Subjective Assessment - 08/13/17 1058    Subjective  Pt. reports back pain.   Patient is accompained by: Family member   Pertinent History see epic.  L CVA, R hemiopsia, Parkinson's, essential tremor   Patient Stated Goals I want to get well and be perfect again. I hate these questions (pt unable to give specific answer)   Pain Score 1    Pain Location Back      OT TREATMENT    Selfcare:  Pt. worked on visual scanning utilizing visual compensatory strategies navigating, and locating places on the a map of [place], and [place]. Pt. requires increased time, verbal cues, and cues to the far right right.                             OT Education - 08/13/17 1059     Education provided Yes   Education Details cognitive compensatory startegies.   Person(s) Educated Patient   Methods Explanation   Comprehension Verbalized understanding             OT Long Term Goals - 08/13/17 1126      OT LONG TERM GOAL #1   Title Pt and wife will be mod I with ugraded HEP.   Time 12   Status On-going   Target Date 10/29/17     OT LONG TERM GOAL #5   Title Pt will require no more than 2 vc's to recall information from therapy session   Time 12   Period Weeks   Status On-going   Target Date 10/29/17     OT LONG TERM GOAL #6   Title Pt will complete table top scanning activity (not language based) efficiently with 100% accuracy   Time 12   Period Weeks   Status Revised     OT LONG TERM GOAL #7   Title Pt will complete environmental scanning tasks efficiently with 100% accuracy    Time 12   Period Weeks   Status On-going     OT LONG TERM GOAL #8   Title Pt will perform simple divided attention  with environmental scanning for improved safety for ambulation/in prep for driving with at least 75% accuracy.   Baseline Pt. will demonstrate visual compensatory strategi69%es 100% of the time during ADLs, and IADLs.    Time 12   Period Weeks   Status On-going               Plan - 09/04/2017 1112    Clinical Impression Statement Pt. reports that he went to the gas station today to get gas, and he forgot his zip code. Pt. reports he had to leave, and go to another station. By the time he got to the next station, he was able to remember. Pt. reports it stills takes him time in the morning to figure out he needs to do to get ready. Pt. continues to work on visual complensatory startegies, visual scanning strategies, and cognitive compensatory strategies with visaul, and verbal cues. Pt. continues to require increased time to complete, and continues to have omission, and misidentifications. to the far right  during scanning tasks.   Occupational performance  deficits (Please refer to evaluation for details): ADL's;IADL's   Rehab Potential Good   Current Impairments/barriers affecting progress: decr awareness   OT Frequency 2x / week   OT Duration 12 weeks   OT Treatment/Interventions Self-care/ADL training;Electrical Stimulation;Therapeutic exercise;Neuromuscular education;DME and/or AE instruction;Energy conservation;Building services engineer;Therapeutic activities;Cognitive remediation/compensation;Visual/perceptual remediation/compensation;Patient/family education;Balance training   Consulted and Agree with Plan of Care Patient   Family Member Consulted wife Dois Davenport      Patient will benefit from skilled therapeutic intervention in order to improve the following deficits and impairments:  Decreased balance, Decreased cognition, Decreased coordination, Decreased safety awareness, Decreased mobility, Decreased knowledge of use of DME, Impaired UE functional use, Impaired vision/preception  Visit Diagnosis: Hemianopsia      G-Codes - 04-Sep-2017 1120    Functional Assessment Tool Used (Outpatient only) skilled clinical observation   Functional Limitation Self care   Self Care Current Status (G2952) At least 1 percent but less than 20 percent impaired, limited or restricted   Self Care Goal Status (W4132) 0 percent impaired, limited or restricted      Problem List Patient Active Problem List   Diagnosis Date Noted  . Stenosis of right carotid artery 02/01/2017  . Acute embolic stroke ([facility]) 02/01/2017  . Bradycardia 11/10/2015  . Essential tremor 06/03/2015  . Dizziness 08/21/2012  . Syncope 04/10/2011  . CALF PAIN, LEFT 10/11/2010  . DYSPEPSIA 07/31/2010  . CHEST DISCOMFORT 07/31/2010  . Hyperlipidemia 05/16/2010  . HYPERTENSION, BENIGN 05/16/2010  . CAD, NATIVE VESSEL 05/16/2010    Olegario Messier, MS, OTR/L 2017/09/04, 11:29 AM  [place] [facility] MAIN [facility] SERVICES [address]  [place], [place], [address] Phone: [phone]   Fax:  [phone]  Name: Coral Soler [facility]. MRN: [id number] Date of Birth: 11/03/1933

## 2017-08-15 ENCOUNTER — Encounter: Payer: Self-pay | Admitting: Speech Pathology

## 2017-08-15 ENCOUNTER — Ambulatory Visit: Payer: Medicare Other | Admitting: Speech Pathology

## 2017-08-15 ENCOUNTER — Ambulatory Visit: Payer: Medicare Other | Admitting: Occupational Therapy

## 2017-08-15 DIAGNOSIS — R4701 Aphasia: Secondary | ICD-10-CM | POA: Diagnosis not present

## 2017-08-15 DIAGNOSIS — M6281 Muscle weakness (generalized): Secondary | ICD-10-CM

## 2017-08-15 LAB — CUP PACEART REMOTE DEVICE CHECK
Date Time Interrogation Session: 20180910233749
Implantable Pulse Generator Implant Date: 20180612

## 2017-08-15 NOTE — Therapy (Signed)
[place] MAIN [facility] SERVICES [address] [place], [place], [address] Phone: [phone]   Fax:  [phone]  Speech Language Pathology Treatment  Patient Details  Name: [name]. MRN: [id number] Date of Birth: 1933-03-10 Referring Provider: Launa Grill, MD  Encounter Date: 08/15/2017      End of Session - 08/15/17 1208    Visit Number 46   Number of Visits 61   Date for SLP Re-Evaluation 09/24/17   SLP Start Time 1003   SLP Stop Time  1100   SLP Time Calculation (min) 57 min   Activity Tolerance Patient tolerated treatment well      Past Medical History:  Diagnosis Date  . BPH (benign prostatic hyperplasia)   . Coronary artery disease   . Hypertension     Past Surgical History:  Procedure Laterality Date  . Intestinal Blockage    . LOOP RECORDER INSERTION N/A 05/14/2017   Procedure: Loop Recorder Insertion;  Surgeon: [name], MD;  Location: [place] [facility];  Service: Cardiovascular;  Laterality: N/A;  . SKIN CANCER EXCISION     x2  . TONSILLECTOMY      There were no vitals filed for this visit.      Subjective Assessment - 08/15/17 1206    Subjective "It was a slam dunk" referring to his homework   Currently in Pain? No/denies               ADULT SLP TREATMENT - 08/15/17 0001      General Information   Behavior/Cognition Alert;Cooperative;Pleasant mood     Treatment Provided   Treatment provided Cognitive-Linquistic     Pain Assessment   Pain Assessment No/denies pain     Cognitive-Linquistic Treatment   Treatment focused on Aphasia   Skilled Treatment WORD FINDING: Complete a variety of word finding activities (word deduction, and semantic feature analysis) with overall 80% accuracy independently.  LANGUAGE: Compare and contrast 2 pictured objects given min cues to clarify and elaborate responses.     Assessment / Recommendations / Plan   Plan Continue with current plan  of care     Progression Toward Goals   Progression toward goals Progressing toward goals          SLP Education - 08/15/17 1207    Education provided Yes   Education Details word finding strategies   Person(s) Educated Patient   Methods Explanation   Comprehension Verbalized understanding          SLP Short Term Goals - 04/01/17 1015      SLP SHORT TERM GOAL #1   Baseline 04/01/17 - avg 8          SLP Long Term Goals - 07/25/17 1314      SLP LONG TERM GOAL #4   Title Patient will generate grammatical and meaningful sentences to complete moderate cognitive-linguistic task with 80% accruacy.   Time 8   Period Weeks   Status Partially Met   Target Date 09/24/17     SLP LONG TERM GOAL #5   Title Patient will complete high level word finding activities with 80% accuracy.   Time 8   Period Weeks   Status Partially Met   Target Date 09/24/17          Plan - 08/15/17 1208    Clinical Impression Statement The patient is improving in specific word finding tasks and demonstrates improved spontaneous speech information content.  The patient continues to express  frustration that it takes him longer to complete these relatively simple language tasks.  Overall, the patient demonstrates fewer language errors including improper word usage, reduced flexibility for multiple meanings, and vague language.  The patient is eager to continue ST and can be expected to continue to make gains in self-expression.    Speech Therapy Frequency 2x / week   Duration Other (comment)   Treatment/Interventions Language facilitation;Cueing hierarchy;SLP instruction and feedback;Cognitive reorganization   Potential to Achieve Goals Good   Potential Considerations Ability to learn/carryover information;Cooperation/participation level;Previous level of function;Severity of impairments   SLP Home Exercise Plan list items in categories   Consulted and Agree with Plan of Care Patient      Patient  will benefit from skilled therapeutic intervention in order to improve the following deficits and impairments:   Aphasia    Problem List Patient Active Problem List   Diagnosis Date Noted  . Stenosis of right carotid artery 02/01/2017  . Acute embolic stroke ([place]) 18/98/4210  . Bradycardia 11/10/2015  . Essential tremor 06/03/2015  . Dizziness 08/21/2012  . Syncope 04/10/2011  . CALF PAIN, LEFT 10/11/2010  . DYSPEPSIA 07/31/2010  . CHEST DISCOMFORT 07/31/2010  . Hyperlipidemia 05/16/2010  . HYPERTENSION, BENIGN 05/16/2010  . CAD, NATIVE VESSEL 05/16/2010   Leroy Sea, MS/CCC- SLP  Lou Miner 08/15/2017, 12:09 PM  [place] MAIN [facility] SERVICES [address] [place], [place], [address] Phone: [phone]   Fax:  [phone]   Name: Carr Shartzer [facility]. MRN: [id number] Date of Birth: October 28, 1933
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
## 2017-08-20 ENCOUNTER — Ambulatory Visit: Payer: Medicare Other | Admitting: Occupational Therapy

## 2017-08-20 ENCOUNTER — Encounter: Payer: Self-pay | Admitting: Speech Pathology

## 2017-08-20 ENCOUNTER — Ambulatory Visit: Payer: Medicare Other | Admitting: Speech Pathology

## 2017-08-20 DIAGNOSIS — R4701 Aphasia: Secondary | ICD-10-CM | POA: Diagnosis not present

## 2017-08-20 DIAGNOSIS — M6281 Muscle weakness (generalized): Secondary | ICD-10-CM

## 2017-08-20 DIAGNOSIS — H5347 Heteronymous bilateral field defects: Secondary | ICD-10-CM

## 2017-08-20 NOTE — Telephone Encounter (Signed)
Received forms from [employer].  Placed in nurse box.

## 2017-08-20 NOTE — Therapy (Signed)
[place] MAIN [facility] SERVICES [address] [place], [place], [address] Phone: [phone]   Fax:  [phone]  Speech Language Pathology Treatment  Patient Details  Name: [name]. MRN: [id number] Date of Birth: 10-09-1933 Referring Provider: Launa Grill, MD  Encounter Date: 08/20/2017      End of Session - 08/20/17 1146    Visit Number 47   Number of Visits 81   Date for SLP Re-Evaluation 09/24/17   SLP Start Time 1000   SLP Stop Time  1055   SLP Time Calculation (min) 55 min   Activity Tolerance Patient tolerated treatment well      Past Medical History:  Diagnosis Date  . BPH (benign prostatic hyperplasia)   . Coronary artery disease   . Hypertension     Past Surgical History:  Procedure Laterality Date  . Intestinal Blockage    . LOOP RECORDER INSERTION N/A 05/14/2017   Procedure: Loop Recorder Insertion;  Surgeon: [name], MD;  Location: [place] CV LAB;  Service: Cardiovascular;  Laterality: N/A;  . SKIN CANCER EXCISION     x2  . TONSILLECTOMY      There were no vitals filed for this visit.      Subjective Assessment - 08/20/17 1145    Subjective "It was hard, I didn't get through all of them but she didn't expect me to", in reference to his homework. "This is where my mind goes blank. I can't come up with the word" during an activity.    Currently in Pain? No/denies               ADULT SLP TREATMENT - 08/20/17 0001      General Information   Behavior/Cognition Alert;Cooperative;Pleasant mood     Treatment Provided   Treatment provided Cognitive-Linquistic     Pain Assessment   Pain Assessment No/denies pain     Cognitive-Linquistic Treatment   Treatment focused on Aphasia   Skilled Treatment : Completed a word-finding activity (add to category) with 78% accuracy independently, and 100% accuracy with minimal semantic cueing. Completed various language activities-compare  and contrast 2 pictured objects with moderate cues to think more about the uses of the objects and not just their physical appearance; verbalize multiple definitions- 100% accuracy with moderate semantic feature questions to cue accurate responses as well as reminders not to use the word in the definition     Assessment / Recommendations / [place] with current plan of care     Progression Toward Goals   Progression toward goals Progressing toward goals          SLP Education - 08/20/17 1146    Education provided Yes   Education Details Needs to utilize strategies when having word finding difficulties   Person(s) Educated Patient   Methods Explanation   Comprehension Verbalized understanding          SLP Short Term Goals - 04/01/17 1015      SLP SHORT TERM GOAL #1   Baseline 04/01/17 - avg 8          SLP Long Term Goals - 07/25/17 1314      SLP LONG TERM GOAL #4   Title Patient will generate grammatical and meaningful sentences to complete moderate cognitive-linguistic task with 80% accruacy.   Time 8   Period Weeks   Status Partially Met   Target Date 09/24/17     SLP LONG TERM GOAL #5  Title Patient will complete high level word finding activities with 80% accuracy.   Time 8   Period Weeks   Status Partially Met   Target Date 09/24/17          Plan - 08/20/17 1148    Clinical Impression Statement The patient is improving in specific word finding tasks and demonstrates improved spontaneous speech information content.  The patient continues to express frustration that it takes him longer to complete these relatively simple language tasks.  Overall, the patient demonstrates fewer language errors including improper word usage, reduced flexibility for multiple meanings, and vague language.  The patient is eager to continue ST and can be expected to continue to make gains in self-expression.    Speech Therapy Frequency 2x / week   Duration Other  (comment)   Treatment/Interventions Language facilitation;Cueing hierarchy;SLP instruction and feedback;Cognitive reorganization   Potential to Achieve Goals Good   Potential Considerations Ability to learn/carryover information;Cooperation/participation level;Previous level of function;Severity of impairments   SLP Home Exercise Plan Completing "proverbs"   Consulted and Agree with Plan of Care Patient      Patient will benefit from skilled therapeutic intervention in order to improve the following deficits and impairments:   Aphasia    Problem List Patient Active Problem List   Diagnosis Date Noted  . Stenosis of right carotid artery 02/01/2017  . Acute embolic stroke ([place]) 85/92/9244  . Bradycardia 11/10/2015  . Essential tremor 06/03/2015  . Dizziness 08/21/2012  . Syncope 04/10/2011  . CALF PAIN, LEFT 10/11/2010  . DYSPEPSIA 07/31/2010  . CHEST DISCOMFORT 07/31/2010  . Hyperlipidemia 05/16/2010  . HYPERTENSION, BENIGN 05/16/2010  . CAD, NATIVE VESSEL 05/16/2010    Vasilia Dise [place] 08/20/2017, 11:51 AM  [place] MAIN [facility] SERVICES [address] [place], [place], [address] Phone: [phone]   Fax:  [phone]   Name: Calvin Thrush [facility]. MRN: [id number] Date of Birth: 10-May-1933

## 2017-08-20 NOTE — Therapy (Signed)
[facility] MAIN [facility] SERVICES [address] [place], [place], [address] Phone: [phone]   Fax:  [phone]  Occupational Therapy Treatment  Patient Details  Name: [name]. MRN: [id number] Date of Birth: 1933/02/27 Referring Provider: Dr. Wilfrid Pollard  Encounter Date: 08/20/2017      OT End of Session - 08/20/17 1108    Visit Number 42   Number of Visits 56   Date for OT Re-Evaluation 10/29/17   Authorization Type medicare G-code 2   OT Start Time 1103   OT Stop Time 1145   OT Time Calculation (min) 42 min   Activity Tolerance Patient tolerated treatment well   Behavior During Therapy WFL for tasks assessed/performed      Past Medical History:  Diagnosis Date  . BPH (benign prostatic hyperplasia)   . Coronary artery disease   . Hypertension     Past Surgical History:  Procedure Laterality Date  . Intestinal Blockage    . LOOP RECORDER INSERTION N/A 05/14/2017   Procedure: Loop Recorder Insertion;  Surgeon: Duke Salvia, MD;  Location: [facility] INVASIVE CV LAB;  Service: Cardiovascular;  Laterality: N/A;  . SKIN CANCER EXCISION     x2  . TONSILLECTOMY      There were no vitals filed for this visit.      Subjective Assessment - 08/20/17 1106    Subjective  Pt. reports they did okay in the [facility]   Patient is accompained by: Family member   Pertinent History see epic.  L CVA, R hemiopsia, Parkinson's, essential tremor   Patient Stated Goals I want to get well and be perfect again. I hate these questions (pt unable to give specific answer)   Pain Score 1    Pain Location Back   Pain Descriptors / Indicators Aching      OT TREATMENT    Selfcare:  Pt. worked on tabletop scanning tasks in his near space. Pt. worked on Art gallery manager calendars. Pt. required verbal cues, and increased time to complete. Pt. Had difficulty with adding, and planning the events in the  month.                           OT Education - 08/20/17 1108    Education provided Yes   Education Details visual compensatory strategies, and cognitive compensatory strategies.   Person(s) Educated Patient   Methods Explanation   Comprehension Verbalized understanding            OT Long Term Goals - 08/13/17 1126      OT LONG TERM GOAL #1   Title Pt and wife will be mod I with ugraded HEP.   Time 12   Status On-going   Target Date 10/29/17     OT LONG TERM GOAL #5   Title Pt will require no more than 2 vc's to recall information from therapy session   Time 12   Period Weeks   Status On-going   Target Date 10/29/17     OT LONG TERM GOAL #6   Title Pt will complete table top scanning activity (not language based) efficiently with 100% accuracy   Time 12   Period Weeks   Status Revised     OT LONG TERM GOAL #7   Title Pt will complete environmental scanning tasks efficiently with 100% accuracy    Time 12   Period Weeks   Status On-going  OT LONG TERM GOAL #8   Title Pt will perform simple divided attention with environmental scanning for improved safety for ambulation/in prep for driving with at least 16% accuracy.   Baseline Pt. will demonstrate visual compensatory strategies 100% of the time during ADLs, and IADLs.    Time 12   Period Weeks   Status On-going               Plan - 08/20/17 1109    Clinical Impression Statement Pt. reports it is still difficult to make out the text lines on the bottom of the television. Pt. continues to work on improving Visual scanning, and visual compensatory strategies duirng ADL, and IADL tasks. Pt. conitnues to require increased time to complete, and cues for omissions to the far right.   Occupational performance deficits (Please refer to evaluation for details): ADL's;IADL's   OT Frequency 2x / week   OT Duration 12 weeks   OT Treatment/Interventions Self-care/ADL training;Electrical  Stimulation;Therapeutic exercise;Neuromuscular education;DME and/or AE instruction;Energy conservation;Building services engineer;Therapeutic activities;Cognitive remediation/compensation;Visual/perceptual remediation/compensation;Patient/family education;Balance training   Consulted and Agree with Plan of Care Patient      Patient will benefit from skilled therapeutic intervention in order to improve the following deficits and impairments:  Decreased balance, Decreased cognition, Decreased coordination, Decreased safety awareness, Decreased mobility, Decreased knowledge of use of DME, Impaired UE functional use, Impaired vision/preception  Visit Diagnosis: Muscle weakness (generalized)  Hemianopsia    Problem List Patient Active Problem List   Diagnosis Date Noted  . Stenosis of right carotid artery 02/01/2017  . Acute embolic stroke ([facility]) 02/01/2017  . Bradycardia 11/10/2015  . Essential tremor 06/03/2015  . Dizziness 08/21/2012  . Syncope 04/10/2011  . CALF PAIN, LEFT 10/11/2010  . DYSPEPSIA 07/31/2010  . CHEST DISCOMFORT 07/31/2010  . Hyperlipidemia 05/16/2010  . HYPERTENSION, BENIGN 05/16/2010  . CAD, NATIVE VESSEL 05/16/2010    Olegario Messier, MS, OTR/L 08/20/2017, 11:14 AM  [place] [facility] MAIN [facility] SERVICES [address] [place], [place], [address] Phone: [phone]   Fax:  [phone]  Name: [name] [name] MRN: [id number] Date of Birth: 1932/12/09

## 2017-08-21 ENCOUNTER — Encounter: Payer: Self-pay | Admitting: Gastroenterology

## 2017-08-22 ENCOUNTER — Ambulatory Visit: Payer: Medicare Other | Admitting: Occupational Therapy

## 2017-08-22 ENCOUNTER — Encounter: Payer: Self-pay | Admitting: Speech Pathology

## 2017-08-22 ENCOUNTER — Ambulatory Visit: Payer: Medicare Other | Admitting: Speech Pathology

## 2017-08-22 DIAGNOSIS — R4701 Aphasia: Secondary | ICD-10-CM | POA: Diagnosis not present

## 2017-08-22 DIAGNOSIS — M6281 Muscle weakness (generalized): Secondary | ICD-10-CM

## 2017-08-22 NOTE — Therapy (Signed)
[place] MAIN [facility] SERVICES [address] [place], [place], [address] Phone: [phone]   Fax:  [phone]  Speech Language Pathology Treatment  Patient Details  Name: [name]. MRN: [id number] Date of Birth: 1933-11-25 Referring Provider: Launa Grill, MD  Encounter Date: 08/22/2017      End of Session - 08/22/17 1247    Activity Tolerance Patient tolerated treatment well      Past Medical History:  Diagnosis Date  . BPH (benign prostatic hyperplasia)   . Coronary artery disease   . Hypertension     Past Surgical History:  Procedure Laterality Date  . Intestinal Blockage    . LOOP RECORDER INSERTION N/A 05/14/2017   Procedure: Loop Recorder Insertion;  Surgeon: [name], MD;  Location: [place] CV LAB;  Service: Cardiovascular;  Laterality: N/A;  . SKIN CANCER EXCISION     x2  . TONSILLECTOMY      There were no vitals filed for this visit.      Subjective Assessment - 08/22/17 1245    Subjective "Now we're cadillacing" in reference to completing word deduction activity with ease   Currently in Pain? No/denies               ADULT SLP TREATMENT - 08/22/17 0001      General Information   Behavior/Cognition Alert;Cooperative;Pleasant mood     Treatment Provided   Treatment provided Cognitive-Linquistic     Pain Assessment   Pain Assessment No/denies pain     Cognitive-Linquistic Treatment   Skilled Treatment Completed word-finding activities-word deduction with 84% accuracy independently, and 100% accuracy with minimal semantic cueing. Semantic feature analysis completed with 100% accuracy following minimal semantic cueing. Completed various language activities; verbalize multiple definitions-100% accuracy with minimal semantic feature questions to cue accurate responses, no reminders not to use the word in the definition needed; semantic. Began Tree surgeon activity to be  finished at home      Progression Toward Goals   Progression toward goals Progressing toward goals          SLP Education - 08/22/17 1245    Education provided Yes   Education Details Needs to utilize strategies when having word finding difficulties          SLP Short Term Goals - 04/01/17 1015      SLP SHORT TERM GOAL #1   Baseline 04/01/17 - avg 8          SLP Long Term Goals - 07/25/17 1314      SLP LONG TERM GOAL #4   Title Patient will generate grammatical and meaningful sentences to complete moderate cognitive-linguistic task with 80% accruacy.   Time 8   Period Weeks   Status Partially Met   Target Date 09/24/17     SLP LONG TERM GOAL #5   Title Patient will complete high level word finding activities with 80% accuracy.   Time 8   Period Weeks   Status Partially Met   Target Date 09/24/17          Plan - 08/22/17 1247    Clinical Impression Statement The patient is improving in specific word finding tasks and demonstrates improved spontaneous speech information content.  The patient continues to express frustration that it takes him longer to complete these relatively simple language tasks.  Overall, the patient demonstrates fewer language errors including improper word usage, reduced flexibility for multiple meanings, and vague language.  The patient is eager to  continue ST and can be expected to continue to make gains in self-expression.    Speech Therapy Frequency 2x / week   Duration Other (comment)   Treatment/Interventions Language facilitation;Cueing hierarchy;SLP instruction and feedback;Cognitive reorganization   Potential to Achieve Goals Good   Potential Considerations Ability to learn/carryover information;Severity of impairments;Cooperation/participation level;Previous level of function   SLP Home Exercise Plan Complete sentence construction worksheet   Consulted and Agree with Plan of Care Patient      Patient will benefit from skilled  therapeutic intervention in order to improve the following deficits and impairments:   Aphasia    Problem List Patient Active Problem List   Diagnosis Date Noted  . Stenosis of right carotid artery 02/01/2017  . Acute embolic stroke ([place]) 57/32/2025  . Bradycardia 11/10/2015  . Essential tremor 06/03/2015  . Dizziness 08/21/2012  . Syncope 04/10/2011  . CALF PAIN, LEFT 10/11/2010  . DYSPEPSIA 07/31/2010  . CHEST DISCOMFORT 07/31/2010  . Hyperlipidemia 05/16/2010  . HYPERTENSION, BENIGN 05/16/2010  . CAD, NATIVE VESSEL 05/16/2010    Amin Fornwalt [place] 08/22/2017, 12:49 PM  [place] MAIN [facility] SERVICES [address] [place], [place], [address] Phone: [phone]   Fax:  [phone]   Name: [name] [facility]. MRN: [id number] Date of Birth: 01/09/1933

## 2017-08-22 NOTE — Therapy (Signed)
[place] MAIN [facility] SERVICES [address] [place], [place], [address] Phone: [phone]   Fax:  [phone]  Occupational Therapy Treatment  Patient Details  Name: [name]. MRN: [id number] Date of Birth: 1933/01/12 Referring Provider: Dr. Rea College  Encounter Date: 08/22/2017      OT End of Session - 08/22/17 1110    Visit Number 43   Number of Visits 34   Date for OT Re-Evaluation 10/29/17   Authorization Type medicare G-code 3   OT Start Time 1105   OT Stop Time 1145   OT Time Calculation (min) 40 min   Activity Tolerance Patient tolerated treatment well   Behavior During Therapy WFL for tasks assessed/performed      Past Medical History:  Diagnosis Date  . BPH (benign prostatic hyperplasia)   . Coronary artery disease   . Hypertension     Past Surgical History:  Procedure Laterality Date  . Intestinal Blockage    . LOOP RECORDER INSERTION N/A 05/14/2017   Procedure: Loop Recorder Insertion;  Surgeon: [name], MD;  Location: [place] CV LAB;  Service: Cardiovascular;  Laterality: N/A;  . SKIN CANCER EXCISION     x2  . TONSILLECTOMY      There were no vitals filed for this visit.      Subjective Assessment - 08/22/17 1106    Subjective  Pt. has an appointment at [facility] next week, so he needs to adjust the schedule.   Patient is accompained by: Family member   Pertinent History see epic.  L CVA, R hemiopsia, Parkinson's, essential tremor   Patient Stated Goals I want to get well and be perfect again. I hate these questions (pt unable to give specific answer)   Currently in Pain? Yes   Pain Score 1    Pain Location Back     OT TREATMENT  Selfcare:  Pt. worked on navigating monthly calendars inserting events, dates, and times into the calendar. Pt. required increased time to complete. Pt. was provided with a weighted pen. Pt. worked on [employer], and paragraphs. Pt. worked on  controlling writing in small spaces.                              OT Education - 08/22/17 1108    Education provided Yes   Education Details Visual compensatory strategies.   Person(s) Educated Patient   Methods Explanation   Comprehension Verbalized understanding          OT Short Term Goals - 02/14/17 1457      OT SHORT TERM GOAL #1   Title Pt and wife will be mod I with HEP - 02/21/2017   Time 4   Period Weeks   Status On-going  needs reinforcement of coordination HEP     OT SHORT TERM GOAL #2   Title Pt will be mod I with shower transfers   Status Achieved  met per pt report     OT SHORT TERM GOAL #3   Title Pt will be mod I with bathing   Status Achieved     OT SHORT TERM GOAL #4   Title Pt will complete table top scanning activity (not lanaguage or letter based) with 75% accuracy   Status Achieved  met for copying small peg design with min v.c.     OT SHORT TERM GOAL #5   Title Pt will attend to  familiar task in busy environment with no more than min vc's.     Status Achieved           OT Long Term Goals - 08/13/17 1126      OT LONG TERM GOAL #1   Title Pt and wife will be mod I with ugraded HEP.   Time 12   Status On-going   Target Date 10/29/17     OT LONG TERM GOAL #5   Title Pt will require no more than 2 vc's to recall information from therapy session   Time 12   Period Weeks   Status On-going   Target Date 10/29/17     OT LONG TERM GOAL #6   Title Pt will complete table top scanning activity (not language based) efficiently with 100% accuracy   Time 12   Period Weeks   Status Revised     OT LONG TERM GOAL #7   Title Pt will complete environmental scanning tasks efficiently with 100% accuracy    Time 12   Period Weeks   Status On-going     OT LONG TERM GOAL #8   Title Pt will perform simple divided attention with environmental scanning for improved safety for ambulation/in prep for driving with at least 75%  accuracy.   Baseline Pt. will demonstrate visual compensatory strategies 100% of the time during ADLs, and IADLs.    Time 12   Period Weeks   Status On-going               Plan - 08/22/17 1111    Clinical Impression Statement Pt. reports he went out to his car this morning after speech therapy, and realized he had OT so he walked back in from the parking lot. Pt. reports he knew he had another therapy, but forgot. Pt. reports he read in the local newspaper that Gabriel Rung is now helping with tremors. Pt. reports he notices that his tremors are improved when he has a couple glesses of wine. Pt. reports he would rather not have the wine to help with the tremors. He is thinking about signing up for it.  Pt. continues to require increased time to complete the monthly calendar from a list of directions, and requires verbal, and visual cues to complete. Pt. was provided with a weighted pen to assist with tremors. Pt. continues to work on improving visual compensatory techniques, and Cognitive IADL compensatory strategies.   Occupational performance deficits (Please refer to evaluation for details): ADL's;IADL's   Rehab Potential Good   OT Frequency 2x / week   OT Treatment/Interventions Self-care/ADL training;Electrical Stimulation;Therapeutic exercise;Neuromuscular education;DME and/or AE instruction;Energy conservation;Therapist, nutritional;Therapeutic activities;Cognitive remediation/compensation;Visual/perceptual remediation/compensation;Patient/family education;Balance training   Consulted and Agree with Plan of Care Patient      Patient will benefit from skilled therapeutic intervention in order to improve the following deficits and impairments:  Decreased balance, Decreased cognition, Decreased coordination, Decreased safety awareness, Decreased mobility, Decreased knowledge of use of DME, Impaired UE functional use, Impaired vision/preception  Visit Diagnosis: Muscle weakness  (generalized)    Problem List Patient Active Problem List   Diagnosis Date Noted  . Stenosis of right carotid artery 02/01/2017  . Acute embolic stroke ([place]) 35/57/3220  . Bradycardia 11/10/2015  . Essential tremor 06/03/2015  . Dizziness 08/21/2012  . Syncope 04/10/2011  . CALF PAIN, LEFT 10/11/2010  . DYSPEPSIA 07/31/2010  . CHEST DISCOMFORT 07/31/2010  . Hyperlipidemia 05/16/2010  . HYPERTENSION, BENIGN 05/16/2010  . CAD, NATIVE VESSEL 05/16/2010  Harrel Carina, MS OTR/L 08/22/2017, 11:35 AM  [place] MAIN [facility] SERVICES [address] [place], [place], [address] Phone: [phone]   Fax:  [phone]  Name: [place] [facility]. MRN: [id number] Date of Birth: 12/25/1932

## 2017-08-27 ENCOUNTER — Ambulatory Visit: Payer: Medicare Other | Admitting: Occupational Therapy

## 2017-08-27 ENCOUNTER — Encounter: Payer: Self-pay | Admitting: Speech Pathology

## 2017-08-27 ENCOUNTER — Ambulatory Visit: Payer: Medicare Other | Admitting: Speech Pathology

## 2017-08-27 DIAGNOSIS — R4701 Aphasia: Secondary | ICD-10-CM | POA: Diagnosis not present

## 2017-08-27 NOTE — Therapy (Cosign Needed)
[place] MAIN [facility] SERVICES [address] [place], [place], [address] Phone: [phone]   Fax:  [phone]  Speech Language Pathology Treatment  Patient Details  Name: [name]. MRN: [id number] Date of Birth: 07-20-1933 Referring Provider: Launa Grill, MD  Encounter Date: 08/27/2017      End of Session - 08/27/17 1405    Visit Number 45   Number of Visits 29   Date for SLP Re-Evaluation 09/24/17   SLP Start Time 1011   SLP Stop Time  1045   SLP Time Calculation (min) 34 min   Activity Tolerance Patient tolerated treatment well      Past Medical History:  Diagnosis Date  . BPH (benign prostatic hyperplasia)   . Coronary artery disease   . Hypertension     Past Surgical History:  Procedure Laterality Date  . Intestinal Blockage    . LOOP RECORDER INSERTION N/A 05/14/2017   Procedure: Loop Recorder Insertion;  Surgeon: [name], MD;  Location: [place] [facility];  Service: Cardiovascular;  Laterality: N/A;  . SKIN CANCER EXCISION     x2  . TONSILLECTOMY      There were no vitals filed for this visit.      Subjective Assessment - 08/27/17 1404    Subjective The patient commented "the words are all floating out there, I just can't get them to come out here (gesturing to his mouth)", and asked "I'm not going backwards am I?" when leaving the session   Currently in Pain? No/denies               ADULT SLP TREATMENT - 08/27/17 0001      General Information   Behavior/Cognition Alert;Cooperative;Pleasant mood     Treatment Provided   Treatment provided Cognitive-Linquistic     Pain Assessment   Pain Assessment No/denies pain     Cognitive-Linquistic Treatment   Skilled Treatment WORD-FINDING: listing 5 items in a category-100% accuracy with minimal to moderate semantic feature cues; wrong category-correctly identified the item that did not belong and reasoned why with 92% accuracy  independently, and 100% with minimal cueing.      Progression Toward Goals   Progression toward goals Progressing toward goals          SLP Education - 08/27/17 1408    Person(s) Educated Patient   Methods Explanation   Comprehension Verbalized understanding          SLP Short Term Goals - 04/01/17 1015      SLP SHORT TERM GOAL #1   Baseline 04/01/17 - avg 8          SLP Long Term Goals - 07/25/17 1314      SLP LONG TERM GOAL #4   Title Patient will generate grammatical and meaningful sentences to complete moderate cognitive-linguistic task with 80% accruacy.   Time 8   Period Weeks   Status Partially Met   Target Date 09/24/17     SLP LONG TERM GOAL #5   Title Patient will complete high level word finding activities with 80% accuracy.   Time 8   Period Weeks   Status Partially Met   Target Date 09/24/17          Plan - 08/27/17 1406    Speech Therapy Frequency 2x / week   Duration Other (comment)   Treatment/Interventions Language facilitation;Cueing hierarchy;Cognitive reorganization;SLP instruction and feedback   Potential to Achieve Goals Good   Potential Considerations Ability to  learn/carryover information;Cooperation/participation level;Severity of impairments;Previous level of function   SLP Home Exercise Plan complete question construction worksheet   Consulted and Agree with Plan of Care Patient      Patient will benefit from skilled therapeutic intervention in order to improve the following deficits and impairments:   Aphasia    Problem List Patient Active Problem List   Diagnosis Date Noted  . Stenosis of right carotid artery 02/01/2017  . Acute embolic stroke ([place]) 38/32/9191  . Bradycardia 11/10/2015  . Essential tremor 06/03/2015  . Dizziness 08/21/2012  . Syncope 04/10/2011  . CALF PAIN, LEFT 10/11/2010  . DYSPEPSIA 07/31/2010  . CHEST DISCOMFORT 07/31/2010  . Hyperlipidemia 05/16/2010  . HYPERTENSION, BENIGN 05/16/2010  .  CAD, NATIVE VESSEL 05/16/2010    [name] [place] 08/27/2017, 2:08 PM  [place] MAIN [facility] SERVICES [address] [place], [place], [address] Phone: [phone]   Fax:  [phone]   Name: [name] [facility]. MRN: [id number] Date of Birth: 1933-04-17

## 2017-08-28 NOTE — Therapy (Signed)
[place] MAIN [facility] SERVICES [address] [place], [place], [address] Phone: [phone]   Fax:  [phone]  Speech Language Pathology Treatment  Patient Details  Name: [name]. MRN: [id number] Date of Birth: 21-Jul-1933 Referring Provider: Launa Grill, MD  Encounter Date: 08/27/2017      End of Session - 08/27/17 1405    Visit Number 48   Number of Visits 27   Date for SLP Re-Evaluation 09/24/17   SLP Start Time 1011   SLP Stop Time  1045   SLP Time Calculation (min) 34 min   Activity Tolerance Patient tolerated treatment well      Past Medical History:  Diagnosis Date  . BPH (benign prostatic hyperplasia)   . Coronary artery disease   . Hypertension     Past Surgical History:  Procedure Laterality Date  . Intestinal Blockage    . LOOP RECORDER INSERTION N/A 05/14/2017   Procedure: Loop Recorder Insertion;  Surgeon: [name], MD;  Location: [place] [facility];  Service: Cardiovascular;  Laterality: N/A;  . SKIN CANCER EXCISION     x2  . TONSILLECTOMY      There were no vitals filed for this visit.      Subjective Assessment - 08/27/17 1404    Subjective The patient commented "the words are all floating out there, I just can't get them to come out here (gesturing to his mouth)", and asked "I'm not going backwards am I?" when leaving the session   Currently in Pain? No/denies               ADULT SLP TREATMENT - 08/28/17 0001      General Information   Behavior/Cognition Alert;Cooperative;Pleasant mood     Treatment Provided   Treatment provided Cognitive-Linquistic     Pain Assessment   Pain Assessment No/denies pain     Cognitive-Linquistic Treatment   Treatment focused on Aphasia   Skilled Treatment WORD-FINDING: listing 5 items in a category-100% accuracy with minimal to moderate semantic feature cues; wrong category-correctly identified the item that did not belong and  reasoned why with 92% accuracy independently, and 100% with minimal cueing.      Assessment / Recommendations / Plan   Plan Continue with current plan of care     Progression Toward Goals   Progression toward goals Progressing toward goals          SLP Education - 08/27/17 1408    Person(s) Educated Patient   Methods Explanation   Comprehension Verbalized understanding          SLP Short Term Goals - 04/01/17 1015      SLP SHORT TERM GOAL #1   Baseline 04/01/17 - avg 8          SLP Long Term Goals - 07/25/17 1314      SLP LONG TERM GOAL #4   Title Patient will generate grammatical and meaningful sentences to complete moderate cognitive-linguistic task with 80% accruacy.   Time 8   Period Weeks   Status Partially Met   Target Date 09/24/17     SLP LONG TERM GOAL #5   Title Patient will complete high level word finding activities with 80% accuracy.   Time 8   Period Weeks   Status Partially Met   Target Date 09/24/17          Plan - 08/27/17 1647    Clinical Impression Statement The patient is improving in specific word  finding tasks and demonstrates improved spontaneous speech information content.  The patient continues to express frustration that it takes him longer to complete these relatively simple language tasks.  Overall, the patient demonstrates fewer language errors including improper word usage, reduced flexibility for multiple meanings, and vague language.  The patient is eager to continue ST and can be expected to continue to make gains in self-expression.    Speech Therapy Frequency 2x / week   Duration Other (comment)   Treatment/Interventions Language facilitation;Cueing hierarchy;Cognitive reorganization;SLP instruction and feedback   Potential to Achieve Goals Good   Potential Considerations Ability to learn/carryover information;Cooperation/participation level;Severity of impairments;Previous level of function   SLP Home Exercise Plan complete  question construction worksheet   Consulted and Agree with Plan of Care Patient      Patient will benefit from skilled therapeutic intervention in order to improve the following deficits and impairments:   Aphasia    Problem List Patient Active Problem List   Diagnosis Date Noted  . Stenosis of right carotid artery 02/01/2017  . Acute embolic stroke ([place]) 99/14/4458  . Bradycardia 11/10/2015  . Essential tremor 06/03/2015  . Dizziness 08/21/2012  . Syncope 04/10/2011  . CALF PAIN, LEFT 10/11/2010  . DYSPEPSIA 07/31/2010  . CHEST DISCOMFORT 07/31/2010  . Hyperlipidemia 05/16/2010  . HYPERTENSION, BENIGN 05/16/2010  . CAD, NATIVE VESSEL 05/16/2010   Leroy Sea, MS/CCC- SLP  Lou Miner 08/28/2017, 8:28 AM  [place] MAIN [facility] SERVICES [address] [place], [place], [address] Phone: [phone]   Fax:  [phone]   Name: [name] [facility]. MRN: [id number] Date of Birth: 01-16-1933

## 2017-08-29 ENCOUNTER — Encounter: Payer: Self-pay | Admitting: Speech Pathology

## 2017-08-29 ENCOUNTER — Ambulatory Visit: Payer: Medicare Other | Admitting: Occupational Therapy

## 2017-08-29 ENCOUNTER — Ambulatory Visit: Payer: Medicare Other | Admitting: Speech Pathology

## 2017-08-29 DIAGNOSIS — R4701 Aphasia: Secondary | ICD-10-CM

## 2017-08-29 DIAGNOSIS — R4189 Other symptoms and signs involving cognitive functions and awareness: Secondary | ICD-10-CM

## 2017-08-29 DIAGNOSIS — H5347 Heteronymous bilateral field defects: Secondary | ICD-10-CM

## 2017-08-29 NOTE — Therapy (Signed)
[place] [facility] MAIN [facility] SERVICES [address] [place], [place], [address] Phone: [phone]   Fax:  [phone]  Occupational Therapy Treatment  Patient Details  Name: [name]. MRN: [id number] Date of Birth: 04-13-1933 Referring Provider: Dr. Wilfrid Lund  Encounter Date: 08/29/2017      OT End of Session - 08/29/17 1127    Visit Number 44   Number of Visits 56   Date for OT Re-Evaluation 10/29/17   Authorization Type medicare G-code 4   Activity Tolerance Patient tolerated treatment well   Behavior During Therapy [facility] for tasks assessed/performed      Past Medical History:  Diagnosis Date  . BPH (benign prostatic hyperplasia)   . Coronary artery disease   . Hypertension     Past Surgical History:  Procedure Laterality Date  . Intestinal Blockage    . LOOP RECORDER INSERTION N/A 05/14/2017   Procedure: Loop Recorder Insertion;  Surgeon: Duke Salvia, MD;  Location: [facility];  Service: Cardiovascular;  Laterality: N/A;  . SKIN CANCER EXCISION     x2  . TONSILLECTOMY      There were no vitals filed for this visit.      Subjective Assessment - 08/29/17 1102    Subjective  Pt. reports he had an appointment at [facility].   Patient is accompained by: Family member   Currently in Pain? Yes   Pain Score 1    Pain Location Back   Pain Descriptors / Indicators Aching      OT TREATMENT    Selfcare:  Pt. was administered the MVPT to determine how the visual perceptual functioning may be affecting ADL/IADL tasks. Pt. accurately complete 34/36 items with two items being missed in the visual closure section. Pt. worked on Arts development officer calendars, and adding items from a grocery list. Pt. Reports he has difficulty with adding items without a calculator.                                OT Education - 08/29/17 1126    Education provided Yes   Education Details visual perceptual  functioning.   Person(s) Educated Patient   Methods Explanation   Comprehension Verbalized understanding            OT Long Term Goals - 08/13/17 1126      OT LONG TERM GOAL #1   Title Pt and wife will be mod I with ugraded HEP.   Time 12   Status On-going   Target Date 10/29/17     OT LONG TERM GOAL #5   Title Pt will require no more than 2 vc's to recall information from therapy session   Time 12   Period Weeks   Status On-going   Target Date 10/29/17     OT LONG TERM GOAL #6   Title Pt will complete table top scanning activity (not language based) efficiently with 100% accuracy   Time 12   Period Weeks   Status Revised     OT LONG TERM GOAL #7   Title Pt will complete environmental scanning tasks efficiently with 100% accuracy    Time 12   Period Weeks   Status On-going     OT LONG TERM GOAL #8   Title Pt will perform simple divided attention with environmental scanning for improved safety for ambulation/in prep for driving with at least 40% accuracy.   Baseline  Pt. will demonstrate visual compensatory strategies 100% of the time during ADLs, and IADLs.    Time 12   Period Weeks   Status On-going               Plan - 08/29/17 1128    Clinical Impression Statement Pt. was administered The MFVPT to assess how visual perceptual functioning may be affecting ADL< and IADL functioning. Pt. was able to complete 34/36 accurately. Two responses were missed in the visual closure subgroup. Pt. continues to work on improving visual, and cognitive compensatory strategies for ADLs, and IADLs.    Current Impairments/barriers affecting progress: decr awareness   OT Frequency 2x / week   OT Duration 12 weeks   OT Treatment/Interventions Self-care/ADL training;Electrical Stimulation;Therapeutic exercise;Neuromuscular education;DME and/or AE instruction;Energy conservation;Building services engineer;Therapeutic activities;Cognitive  remediation/compensation;Visual/perceptual remediation/compensation;Patient/family education;Balance training   Consulted and Agree with Plan of Care Patient      Patient will benefit from skilled therapeutic intervention in order to improve the following deficits and impairments:  Decreased balance, Decreased cognition, Decreased coordination, Decreased safety awareness, Decreased mobility, Decreased knowledge of use of DME, Impaired UE functional use, Impaired vision/preception  Visit Diagnosis: Hemianopsia  Impaired cognition    Problem List Patient Active Problem List   Diagnosis Date Noted  . Stenosis of right carotid artery 02/01/2017  . Acute embolic stroke ([facility]) 02/01/2017  . Bradycardia 11/10/2015  . Essential tremor 06/03/2015  . Dizziness 08/21/2012  . Syncope 04/10/2011  . CALF PAIN, LEFT 10/11/2010  . DYSPEPSIA 07/31/2010  . CHEST DISCOMFORT 07/31/2010  . Hyperlipidemia 05/16/2010  . HYPERTENSION, BENIGN 05/16/2010  . CAD, NATIVE VESSEL 05/16/2010    Olegario Messier, MS, OTR/L 08/29/2017, 11:39 AM  [place] [facility] MAIN [facility] SERVICES [address] [place], [place], [address] Phone: [phone]   Fax:  [phone]  Name: Sriman Tally [facility]. MRN: [id number] Date of Birth: [birth year]

## 2017-08-29 NOTE — Therapy (Signed)
[place] MAIN [facility] SERVICES [address] [place], [place], [address] Phone: [phone]   Fax:  [phone]  Speech Language Pathology Treatment/Progress Note  Patient Details  Name: [name]. MRN: [id number] Date of Birth: Jun 24, 1933 Referring Provider: Launa Grill, MD  Encounter Date: 08/29/2017      End of Session - 08/29/17 1221    Visit Number 50   Number of Visits 59   Date for SLP Re-Evaluation 09/24/17   SLP Start Time 1000   SLP Stop Time  1055   SLP Time Calculation (min) 55 min   Activity Tolerance Patient tolerated treatment well      Past Medical History:  Diagnosis Date  . BPH (benign prostatic hyperplasia)   . Coronary artery disease   . Hypertension     Past Surgical History:  Procedure Laterality Date  . Intestinal Blockage    . LOOP RECORDER INSERTION N/A 05/14/2017   Procedure: Loop Recorder Insertion;  Surgeon: [name], MD;  Location: [place] [facility];  Service: Cardiovascular;  Laterality: N/A;  . SKIN CANCER EXCISION     x2  . TONSILLECTOMY      There were no vitals filed for this visit.      Subjective Assessment - 08/29/17 1220    Subjective Patient commented "we make a good team" after successfully completing an activity    Currently in Pain? No/denies                 SLP Education - 08/29/17 1220    Education provided Yes   Education Details providing cues or "hints" to yourself can help you find the word when you can picture it    Person(s) Educated Patient   Methods Explanation   Comprehension Verbalized understanding          SLP Short Term Goals - 04/01/17 1015      SLP SHORT TERM GOAL #1   Baseline 04/01/17 - avg 8          SLP Long Term Goals - 08/30/17 1126      SLP LONG TERM GOAL #4   Title Patient will generate grammatical and meaningful sentences to complete moderate cognitive-linguistic task with 80% accruacy.   Time 8   Period Weeks   Status Partially Met   Target Date 09/24/17     SLP LONG TERM GOAL #5   Title Patient will complete high level word finding activities with 80% accuracy.   Time 8   Period Weeks   Status Partially Met   Target Date 09/24/17          Plan - 08/29/17 1221    Clinical Impression Statement The patient is improving in specific word finding tasks and demonstrates improved spontaneous speech information content.  The patient overall needed less external cueing and exhibited more self-cueing. Overall, the patient demonstrates fewer language errors including improper word usage, reduced flexibility for multiple meanings, and vague language.  The patient is eager to continue ST and can be expected to continue to make gains in self-expression.   Speech Therapy Frequency 2x / week   Duration Other (comment)   Treatment/Interventions Language facilitation;Cognitive reorganization;Cueing hierarchy;SLP instruction and feedback   Potential to Achieve Goals Good   Potential Considerations Ability to learn/carryover information;Cooperation/participation level;Severity of impairments;Previous level of function   SLP Home Exercise Plan complete Sentence contsruction worksheet   Consulted and Agree with Plan of Care Patient      Patient  will benefit from skilled therapeutic intervention in order to improve the following deficits and impairments:   Aphasia    Problem List Patient Active Problem List   Diagnosis Date Noted  . Stenosis of right carotid artery 02/01/2017  . Acute embolic stroke ([place]) 77/41/4239  . Bradycardia 11/10/2015  . Essential tremor 06/03/2015  . Dizziness 08/21/2012  . Syncope 04/10/2011  . CALF PAIN, LEFT 10/11/2010  . DYSPEPSIA 07/31/2010  . CHEST DISCOMFORT 07/31/2010  . Hyperlipidemia 05/16/2010  . HYPERTENSION, BENIGN 05/16/2010  . CAD, NATIVE VESSEL 05/16/2010    Jaselle Pryer [place] 08/30/2017, 12:03 PM  [place]  MAIN [facility] SERVICES [address] [place], [place], [address] Phone: [phone]   Fax:  [phone]   Name: [name] [facility]. MRN: [id number] Date of Birth: Jan 13, 1933

## 2017-09-03 ENCOUNTER — Ambulatory Visit: Payer: Medicare Other | Attending: Internal Medicine | Admitting: Speech Pathology

## 2017-09-03 ENCOUNTER — Ambulatory Visit: Payer: Medicare Other | Admitting: Occupational Therapy

## 2017-09-03 ENCOUNTER — Encounter: Payer: Self-pay | Admitting: Speech Pathology

## 2017-09-03 DIAGNOSIS — M6281 Muscle weakness (generalized): Secondary | ICD-10-CM | POA: Insufficient documentation

## 2017-09-03 DIAGNOSIS — H5347 Heteronymous bilateral field defects: Secondary | ICD-10-CM

## 2017-09-03 DIAGNOSIS — R278 Other lack of coordination: Secondary | ICD-10-CM

## 2017-09-03 DIAGNOSIS — R4701 Aphasia: Secondary | ICD-10-CM | POA: Diagnosis present

## 2017-09-03 DIAGNOSIS — I69318 Other symptoms and signs involving cognitive functions following cerebral infarction: Secondary | ICD-10-CM | POA: Diagnosis present

## 2017-09-03 DIAGNOSIS — R4189 Other symptoms and signs involving cognitive functions and awareness: Secondary | ICD-10-CM | POA: Diagnosis present

## 2017-09-03 NOTE — Therapy (Signed)
[place] MAIN [facility] SERVICES [address] [place], [place], [address] Phone: [phone]   Fax:  [phone]  Speech Language Pathology Treatment  Patient Details  Name: [name]. MRN: [id number] Date of Birth: 1933/01/02 Referring Provider: Launa Grill, MD  Encounter Date: 09/03/2017      End of Session - 09/03/17 1241    Visit Number 51   Number of Visits 8   Date for SLP Re-Evaluation 09/24/17   SLP Start Time 1000   SLP Stop Time  1057   SLP Time Calculation (min) 57 min   Activity Tolerance Patient tolerated treatment well      Past Medical History:  Diagnosis Date  . BPH (benign prostatic hyperplasia)   . Coronary artery disease   . Hypertension     Past Surgical History:  Procedure Laterality Date  . Intestinal Blockage    . LOOP RECORDER INSERTION N/A 05/14/2017   Procedure: Loop Recorder Insertion;  Surgeon: [name], MD;  Location: [place] [facility];  Service: Cardiovascular;  Laterality: N/A;  . SKIN CANCER EXCISION     x2  . TONSILLECTOMY      There were no vitals filed for this visit.      Subjective Assessment - 09/03/17 1239    Subjective Patient asked "do people ever go backwards in this?" when working through an exercise, but smiled when informed of progress on multiple definitions word finding task    Currently in Pain? No/denies               ADULT SLP TREATMENT - 09/03/17 0001      General Information   Behavior/Cognition Alert;Cooperative;Pleasant mood     Treatment Provided   Treatment provided Cognitive-Linquistic     Pain Assessment   Pain Assessment No/denies pain     Cognitive-Linquistic Treatment   Skilled Treatment WORD-FINDING: adding items to a category-100% accuracy with moderate semantic feature cueing; listing 5 items in a category (abstract)-100% accuracy with moderate semantic feature cues; multiple definitions-100% accuracy with minimal to no  cueing. SENTENCE CONSTRUCTION: Combining two sentences into one simplified sentence-100% accuracy with minimal cueing-will complete remainder of worksheet for homework      Assessment / Recommendations / [place] with current plan of care     Progression Toward Goals   Progression toward goals Progressing toward goals          SLP Education - 09/03/17 1240    Education provided Yes   Education Details improved on multiple definitions task, needs to continue to self-cue    Person(s) Educated Patient   Methods Explanation   Comprehension Verbalized understanding          SLP Short Term Goals - 04/01/17 1015      SLP SHORT TERM GOAL #1   Baseline 04/01/17 - avg 8          SLP Long Term Goals - 08/30/17 1126      SLP LONG TERM GOAL #4   Title Patient will generate grammatical and meaningful sentences to complete moderate cognitive-linguistic task with 80% accruacy.   Time 8   Period Weeks   Status Partially Met   Target Date 09/24/17     SLP LONG TERM GOAL #5   Title Patient will complete high level word finding activities with 80% accuracy.   Time 8   Period Weeks   Status Partially Met   Target Date 09/24/17  Plan - 09/03/17 1411    Clinical Impression Statement The patient demonstrated improvement in the word finding multiple definitions task-he could quickly give a definition without using the word itself. The patient exhibited increased self-cueing. Overall, the patient demonstrates fewer language errors including improper word usage, reduced flexibility for multiple meanings, and vague language.  The patient is eager to continue ST and can be expected to continue to make gains in self-expression      Patient will benefit from skilled therapeutic intervention in order to improve the following deficits and impairments:   Aphasia    Problem List Patient Active Problem List   Diagnosis Date Noted  . Stenosis of right carotid artery  02/01/2017  . Acute embolic stroke ([place]) 48/47/2072  . Bradycardia 11/10/2015  . Essential tremor 06/03/2015  . Dizziness 08/21/2012  . Syncope 04/10/2011  . CALF PAIN, LEFT 10/11/2010  . DYSPEPSIA 07/31/2010  . CHEST DISCOMFORT 07/31/2010  . Hyperlipidemia 05/16/2010  . HYPERTENSION, BENIGN 05/16/2010  . CAD, NATIVE VESSEL 05/16/2010   This entire session was performed under direct supervision and direction of a licensed therapist/therapist assistant . I have personally read, edited and approve of the note as written.   Elieser Tetrick [place] 09/03/2017, 2:11 PM  Charlean Sanfilippo, MA, [place] MAIN [facility] SERVICES [address] [place], [place], [address] Phone: [phone]   Fax:  [phone]   Name: Calvin [facility] MRN: [id number] Date of Birth: 29-Aug-1933

## 2017-09-03 NOTE — Telephone Encounter (Signed)
Patient came to the office to check on the status of these forms.

## 2017-09-03 NOTE — Therapy (Signed)
[place] MAIN [facility] SERVICES [address] [place], [place], [address] Phone: [phone]   Fax:  [phone]  Occupational Therapy Treatment  Patient Details  Name: [name]. MRN: [id number] Date of Birth: 1933/02/05 Referring Provider: Dr. Rea College  Encounter Date: 09/03/2017      OT End of Session - 09/03/17 1116    Visit Number 45   Number of Visits 9   Date for OT Re-Evaluation 10/29/17   Authorization Type medicare G-code 5   OT Start Time 1100   OT Stop Time 1145   OT Time Calculation (min) 45 min   Activity Tolerance Patient tolerated treatment well   Behavior During Therapy [facility] for tasks assessed/performed      Past Medical History:  Diagnosis Date  . BPH (benign prostatic hyperplasia)   . Coronary artery disease   . Hypertension     Past Surgical History:  Procedure Laterality Date  . Intestinal Blockage    . LOOP RECORDER INSERTION N/A 05/14/2017   Procedure: Loop Recorder Insertion;  Surgeon: [name], MD;  Location: [place] [facility];  Service: Cardiovascular;  Laterality: N/A;  . SKIN CANCER EXCISION     x2  . TONSILLECTOMY      There were no vitals filed for this visit.      Subjective Assessment - 09/03/17 1113    Subjective  Pt. reports he will be off next week.   Patient is accompained by: Family member   Pertinent History see epic.  L CVA, R hemiopsia, Parkinson's, essential tremor   Patient Stated Goals I want to get well and be perfect again. I hate these questions (pt unable to give specific answer)   Currently in Pain? No/denies     OT TREATMENT    Selfcare:  Pt. worked on Industrial/product designer calendars. Pt. worked on combining events from 2 calendars into 1 calendar. Pt. required increased time, verbal, and visual cues for visual scanning to the far right. Pt. worked and Building surveyor, and writing small in the designated spaces. Pt. presented with limited  shaking.                             OT Education - 09/03/17 1114    Education provided Yes   Education Details visual scanning, cognitive compensatory strategies.   Person(s) Educated Patient   Methods Explanation   Comprehension Verbalized understanding          OT Short Term Goals - 02/14/17 1457      OT SHORT TERM GOAL #1   Title Pt and wife will be mod I with HEP - 02/21/2017   Time 4   Period Weeks   Status On-going  needs reinforcement of coordination HEP     OT SHORT TERM GOAL #2   Title Pt will be mod I with shower transfers   Status Achieved  met per pt report     OT SHORT TERM GOAL #3   Title Pt will be mod I with bathing   Status Achieved     OT SHORT TERM GOAL #4   Title Pt will complete table top scanning activity (not lanaguage or letter based) with 75% accuracy   Status Achieved  met for copying small peg design with min v.c.     OT SHORT TERM GOAL #5   Title Pt will attend to familiar task in busy environment with no more  than min vc's.     Status Achieved           OT Long Term Goals - 08/13/17 1126      OT LONG TERM GOAL #1   Title Pt and wife will be mod I with ugraded HEP.   Time 12   Status On-going   Target Date 10/29/17     OT LONG TERM GOAL #5   Title Pt will require no more than 2 vc's to recall information from therapy session   Time 12   Period Weeks   Status On-going   Target Date 10/29/17     OT LONG TERM GOAL #6   Title Pt will complete table top scanning activity (not language based) efficiently with 100% accuracy   Time 12   Period Weeks   Status Revised     OT LONG TERM GOAL #7   Title Pt will complete environmental scanning tasks efficiently with 100% accuracy    Time 12   Period Weeks   Status On-going     OT LONG TERM GOAL #8   Title Pt will perform simple divided attention with environmental scanning for improved safety for ambulation/in prep for driving with at least 75% accuracy.    Baseline Pt. will demonstrate visual compensatory strategies 100% of the time during ADLs, and IADLs.    Time 12   Period Weeks   Status On-going               Plan - 09/03/17 1116    Clinical Impression Statement Pt. continues to work on improving visual scanning, visual, and cognitive compensatory strategies. Pt. continues to need to work on these skills in order to improve function within his environment.   Occupational performance deficits (Please refer to evaluation for details): ADL's;IADL's   Rehab Potential Good   Current Impairments/barriers affecting progress: decr awareness   OT Frequency 2x / week   OT Duration 12 weeks   OT Treatment/Interventions Self-care/ADL training;Electrical Stimulation;Therapeutic exercise;Neuromuscular education;DME and/or AE instruction;Energy conservation;Therapist, nutritional;Therapeutic activities;Cognitive remediation/compensation;Visual/perceptual remediation/compensation;Patient/family education;Balance training   Consulted and Agree with Plan of Care Patient      Patient will benefit from skilled therapeutic intervention in order to improve the following deficits and impairments:  Decreased balance, Decreased cognition, Decreased coordination, Decreased safety awareness, Decreased mobility, Decreased knowledge of use of DME, Impaired UE functional use, Impaired vision/preception  Visit Diagnosis: Other lack of coordination  Hemianopsia    Problem List Patient Active Problem List   Diagnosis Date Noted  . Stenosis of right carotid artery 02/01/2017  . Acute embolic stroke ([place]) 70/12/7492  . Bradycardia 11/10/2015  . Essential tremor 06/03/2015  . Dizziness 08/21/2012  . Syncope 04/10/2011  . CALF PAIN, LEFT 10/11/2010  . DYSPEPSIA 07/31/2010  . CHEST DISCOMFORT 07/31/2010  . Hyperlipidemia 05/16/2010  . HYPERTENSION, BENIGN 05/16/2010  . CAD, NATIVE VESSEL 05/16/2010    Harrel Carina, MS, OTR/L 09/03/2017,  11:24 AM  [place] MAIN [facility] SERVICES [address] [place], [place], [address] Phone: [phone]   Fax:  [phone]  Name: [name]. MRN: [id number] Date of Birth: 18-Jan-1933

## 2017-09-06 ENCOUNTER — Encounter: Payer: Self-pay | Admitting: Speech Pathology

## 2017-09-06 ENCOUNTER — Ambulatory Visit: Payer: Medicare Other | Admitting: Speech Pathology

## 2017-09-06 ENCOUNTER — Ambulatory Visit: Payer: Medicare Other | Admitting: Occupational Therapy

## 2017-09-06 DIAGNOSIS — R4701 Aphasia: Secondary | ICD-10-CM

## 2017-09-06 DIAGNOSIS — H5347 Heteronymous bilateral field defects: Secondary | ICD-10-CM

## 2017-09-06 DIAGNOSIS — M6281 Muscle weakness (generalized): Secondary | ICD-10-CM

## 2017-09-06 NOTE — Therapy (Signed)
[place] MAIN [facility] SERVICES [address] [place], [place], [address] Phone: [phone]   Fax:  [phone]  Speech Language Pathology Treatment  Patient Details  Name: [name]. MRN: [id number] Date of Birth: 05/29/1933 Referring Provider: Launa Grill, MD  Encounter Date: 09/06/2017      End of Session - 09/06/17 1255    Visit Number 52   Number of Visits 65   Date for SLP Re-Evaluation 09/24/17   SLP Start Time 1100   SLP Stop Time  9417   SLP Time Calculation (min) 57 min   Activity Tolerance Patient tolerated treatment well      Past Medical History:  Diagnosis Date  . BPH (benign prostatic hyperplasia)   . Coronary artery disease   . Hypertension     Past Surgical History:  Procedure Laterality Date  . Intestinal Blockage    . LOOP RECORDER INSERTION N/A 05/14/2017   Procedure: Loop Recorder Insertion;  Surgeon: [name], MD;  Location: [place] [facility];  Service: Cardiovascular;  Laterality: N/A;  . SKIN CANCER EXCISION     x2  . TONSILLECTOMY      There were no vitals filed for this visit.      Subjective Assessment - 09/06/17 1254    Subjective Patient reported his homework was easier than any other homework worksheets   Currently in Pain? No/denies               ADULT SLP TREATMENT - 09/06/17 0001      General Information   Behavior/Cognition Alert;Cooperative;Pleasant mood     Treatment Provided   Treatment provided Cognitive-Linquistic     Pain Assessment   Pain Assessment No/denies pain     Cognitive-Linquistic Treatment   Skilled Treatment WORD-FINDING: listing 5 items in a category (abstract)-100% accuracy with moderate semantic feature cues; multiple definitions-100% accuracy with minimal to no cueing. Compare/contrast 2 pictures- 100% accuracy with moderate semantic feature cueing      Progression Toward Goals   Progression toward goals Progressing toward  goals          SLP Education - 09/06/17 1254    Education provided Yes   Education Details using cues and descriptors can help you come up with a word, or help others figure out what you are trying to talk about    Person(s) Educated Patient   Methods Explanation   Comprehension Verbalized understanding          SLP Short Term Goals - 04/01/17 1015      SLP SHORT TERM GOAL #1   Baseline 04/01/17 - avg 8          SLP Long Term Goals - 08/30/17 1126      SLP LONG TERM GOAL #4   Title Patient will generate grammatical and meaningful sentences to complete moderate cognitive-linguistic task with 80% accruacy.   Time 8   Period Weeks   Status Partially Met   Target Date 09/24/17     SLP LONG TERM GOAL #5   Title Patient will complete high level word finding activities with 80% accuracy.   Time 8   Period Weeks   Status Partially Met   Target Date 09/24/17          Plan - 09/06/17 1255    Clinical Impression Statement The patient continues to demonstrate improvement in the word finding multiple definitions task-he could quickly give a definition without using the word itself. The patient  exhibited increased self-cueing. Overall, the patient demonstrates fewer language errors including improper word usage, reduced flexibility for multiple meanings, and vague language.  He continues to improve in his usage of meaningful and grammatical sentences as well. The patient is eager to continue ST and can be expected to continue to make gains in self-expression   Speech Therapy Frequency 2x / week   Duration Other (comment)   Treatment/Interventions Cueing hierarchy;Functional tasks;SLP instruction and feedback;Compensatory strategies;Patient/family education   Potential to Achieve Goals Good   Potential Considerations Ability to learn/carryover information;Previous level of function;Cooperation/participation level;Family/community support;Severity of impairments   SLP Home  Exercise Plan practice using cues for word finding while away on vacation   Consulted and Agree with Plan of Care Patient      Patient will benefit from skilled therapeutic intervention in order to improve the following deficits and impairments:   Aphasia    Problem List Patient Active Problem List   Diagnosis Date Noted  . Stenosis of right carotid artery 02/01/2017  . Acute embolic stroke ([place]) 13/14/3888  . Bradycardia 11/10/2015  . Essential tremor 06/03/2015  . Dizziness 08/21/2012  . Syncope 04/10/2011  . CALF PAIN, LEFT 10/11/2010  . DYSPEPSIA 07/31/2010  . CHEST DISCOMFORT 07/31/2010  . Hyperlipidemia 05/16/2010  . HYPERTENSION, BENIGN 05/16/2010  . CAD, NATIVE VESSEL 05/16/2010   This entire session was performed under direct supervision and direction of a licensed therapist/therapist assistant . I have personally read, edited and approve of the note as written.  Charlean Sanfilippo, [place], CCC-SLP  Speech-Language Pathologist   Wetona Viramontes [place] 09/06/2017, 12:58 PM  [place] MAIN [facility] SERVICES [address] [place], [place], [address] Phone: [phone]   Fax:  [phone]   Name: [name]. MRN: [id number] Date of Birth: 1933/07/07

## 2017-09-06 NOTE — Therapy (Signed)
[place] MAIN [facility] SERVICES [address] [place], [place], [address] Phone: [phone]   Fax:  [phone]  Occupational Therapy Treatment  Patient Details  Name: [name]. MRN: [id number] Date of Birth: [birth year] Referring Provider: Dr. Rea College  Encounter Date: 09/06/2017      OT End of Session - 09/06/17 1040    Visit Number 46   Number of Visits 70   Date for OT Re-Evaluation 10/29/17   Authorization Type medicare G-code 6   OT Start Time 1015   OT Stop Time 1100   OT Time Calculation (min) 45 min   Activity Tolerance Patient tolerated treatment well   Behavior During Therapy [facility] for tasks assessed/performed      Past Medical History:  Diagnosis Date  . BPH (benign prostatic hyperplasia)   . Coronary artery disease   . Hypertension     Past Surgical History:  Procedure Laterality Date  . Intestinal Blockage    . LOOP RECORDER INSERTION N/A 05/14/2017   Procedure: Loop Recorder Insertion;  Surgeon: [name], MD;  Location: [place] [facility];  Service: Cardiovascular;  Laterality: N/A;  . SKIN CANCER EXCISION     x2  . TONSILLECTOMY      There were no vitals filed for this visit.      Subjective Assessment - 09/06/17 1036    Subjective  Pt. 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   Patient is accompained by: Family member   Pertinent History see epic.  L CVA, R hemiopsia, Parkinson's, essential tremor   Patient Stated Goals I want to get well and be perfect again. I hate these questions (pt unable to give specific answer)   Currently in Pain? No/denies     OT TREATMENT    Selfcare:  Pt. was administered the [place] Campimeter to help determine how vision may be affecting ADL functioning. Pt. is improving with with scanning to the right, however continues to have limitations to the far right. Pt. requires verbal  cues, visual cues, and increased time for scanning to the right. Pt. worked on [employer], and required increased time, and verbal cues to complete. Pt. Had more difficulty with the 2 letter words versus 3, and 4 letter words.                            OT Education - 09/06/17 1039    Education provided Yes   Education Details visual scanning, and visual compensatory strategies.   Person(s) Educated Patient   Methods Explanation   Comprehension Verbalized understanding          OT Short Term Goals - 02/14/17 1457      OT SHORT TERM GOAL #1   Title Pt and wife will be mod I with HEP - 02/21/2017   Time 4   Period Weeks   Status On-going  needs reinforcement of coordination HEP     OT SHORT TERM GOAL #2   Title Pt will be mod I with shower transfers   Status Achieved  met per pt report     OT SHORT TERM GOAL #3   Title Pt will be mod I with bathing   Status Achieved     OT SHORT TERM GOAL #4   Title Pt will complete table top scanning activity (not lanaguage or letter  based) with 75% accuracy   Status Achieved  met for copying small peg design with min v.c.     OT SHORT TERM GOAL #5   Title Pt will attend to familiar task in busy environment with no more than min vc's.     Status Achieved           OT Long Term Goals - 08/13/17 1126      OT LONG TERM GOAL #1   Title Pt and wife will be mod I with ugraded HEP.   Time 12   Status On-going   Target Date 10/29/17     OT LONG TERM GOAL #5   Title Pt will require no more than 2 vc's to recall information from therapy session   Time 12   Period Weeks   Status On-going   Target Date 10/29/17     OT LONG TERM GOAL #6   Title Pt will complete table top scanning activity (not language based) efficiently with 100% accuracy   Time 12   Period Weeks   Status Revised     OT LONG TERM GOAL #7   Title Pt will complete environmental scanning tasks efficiently with 100% accuracy    Time 12    Period Weeks   Status On-going     OT LONG TERM GOAL #8   Title Pt will perform simple divided attention with environmental scanning for improved safety for ambulation/in prep for driving with at least 75% accuracy.   Baseline Pt. will demonstrate visual compensatory strategies 100% of the time during ADLs, and IADLs.    Time 12   Period Weeks   Status On-going               Plan - 09/06/17 1041    Clinical Impression Statement Pt. is making progress overall, however continues to require cues, and increased time for visual scanning to the far right. Pt. continues to work on improving visual compensatory, and cognitive compensatory strategies.   Occupational performance deficits (Please refer to evaluation for details): ADL's;IADL's   Rehab Potential Good   OT Frequency 2x / week   OT Duration 12 weeks   OT Treatment/Interventions Self-care/ADL training;Electrical Stimulation;Therapeutic exercise;Neuromuscular education;DME and/or AE instruction;Energy conservation;Therapist, nutritional;Therapeutic activities;Cognitive remediation/compensation;Visual/perceptual remediation/compensation;Patient/family education;Balance training   Consulted and Agree with Plan of Care Patient      Patient will benefit from skilled therapeutic intervention in order to improve the following deficits and impairments:  Decreased balance, Decreased cognition, Decreased coordination, Decreased safety awareness, Decreased mobility, Decreased knowledge of use of DME, Impaired UE functional use, Impaired vision/preception  Visit Diagnosis: Muscle weakness (generalized)  Hemianopsia    Problem List Patient Active Problem List   Diagnosis Date Noted  . Stenosis of right carotid artery 02/01/2017  . Acute embolic stroke ([place]) 81/09/3158  . Bradycardia 11/10/2015  . Essential tremor 06/03/2015  . Dizziness 08/21/2012  . Syncope 04/10/2011  . CALF PAIN, LEFT 10/11/2010  . DYSPEPSIA 07/31/2010   . CHEST DISCOMFORT 07/31/2010  . Hyperlipidemia 05/16/2010  . HYPERTENSION, BENIGN 05/16/2010  . CAD, NATIVE VESSEL 05/16/2010    Harrel Carina, MS, OTR/L 09/06/2017, 10:51 AM  [place] MAIN [facility] SERVICES [address] [place], [place], [address] Phone: [phone]   Fax:  [phone]  Name: Calvin [facility]. MRN: [id number] Date of Birth: 10/26/1933

## 2017-09-10 ENCOUNTER — Encounter: Payer: Medicare Other | Admitting: Occupational Therapy

## 2017-09-11 ENCOUNTER — Encounter: Payer: Medicare Other | Admitting: Occupational Therapy

## 2017-09-11 ENCOUNTER — Encounter: Payer: Medicare Other | Admitting: Speech Pathology

## 2017-09-11 ENCOUNTER — Ambulatory Visit (INDEPENDENT_AMBULATORY_CARE_PROVIDER_SITE_OTHER): Payer: Medicare Other | Admitting: *Deleted

## 2017-09-11 DIAGNOSIS — I639 Cerebral infarction, unspecified: Secondary | ICD-10-CM | POA: Diagnosis not present

## 2017-09-12 ENCOUNTER — Encounter: Payer: Self-pay | Admitting: Physical Therapy

## 2017-09-12 ENCOUNTER — Encounter: Payer: Medicare Other | Admitting: Speech Pathology

## 2017-09-12 NOTE — Progress Notes (Signed)
[facility] Summary Report / Loop Recorder 

## 2017-09-12 NOTE — Therapy (Signed)
[place] [address] [place], [place], [address] Phone: [phone]   Fax:  [phone]  Patient Details  Name: [name]. MRN: [id number] Date of Birth: December 20, 1932 Referring Provider:  Launa Grill, MD  Encounter Date: 09/12/2017  PHYSICAL THERAPY DISCHARGE SUMMARY  Visits from Start of Care: 6  Current functional level related to goals / functional outcomes: Unknown patient did not return to PT.    Remaining deficits: Unknown as patient did not return,   Education / Equipment: HEP  Plan: Patient agrees to discharge.  Patient goals were not met. Patient is being discharged due to not returning since the last visit.  ?????         Maurie Musco  PT, DPT 09/12/2017, 12:37 PM  [place] [address] [place], [place], [address] Phone: [phone]   Fax:  [phone]

## 2017-09-13 ENCOUNTER — Ambulatory Visit: Payer: Medicare Other | Admitting: Occupational Therapy

## 2017-09-13 ENCOUNTER — Encounter: Payer: Medicare Other | Admitting: Speech Pathology

## 2017-09-17 ENCOUNTER — Encounter: Payer: Self-pay | Admitting: Speech Pathology

## 2017-09-17 ENCOUNTER — Ambulatory Visit: Payer: Medicare Other | Admitting: Occupational Therapy

## 2017-09-17 ENCOUNTER — Ambulatory Visit: Payer: Medicare Other | Admitting: Speech Pathology

## 2017-09-17 DIAGNOSIS — R4701 Aphasia: Secondary | ICD-10-CM | POA: Diagnosis not present

## 2017-09-17 DIAGNOSIS — M6281 Muscle weakness (generalized): Secondary | ICD-10-CM

## 2017-09-17 LAB — CUP PACEART REMOTE DEVICE CHECK
Date Time Interrogation Session: 20181011001244
Implantable Pulse Generator Implant Date: 20180612

## 2017-09-17 NOTE — Therapy (Signed)
[place] MAIN [facility] SERVICES [address] [place], [place], [address] Phone: [phone]   Fax:  [phone]  Speech Language Pathology Treatment  Patient Details  Name: [name]. MRN: [id number] Date of Birth: [birth year] Referring Provider: Launa Grill, MD  Encounter Date: 09/17/2017      End of Session - 09/17/17 1307    Visit Number 53   Number of Visits 61   Date for SLP Re-Evaluation 09/24/17   SLP Start Time 1100   SLP Stop Time  6962   SLP Time Calculation (min) 57 min   Activity Tolerance Patient tolerated treatment well      Past Medical History:  Diagnosis Date  . BPH (benign prostatic hyperplasia)   . Coronary artery disease   . Hypertension     Past Surgical History:  Procedure Laterality Date  . Intestinal Blockage    . LOOP RECORDER INSERTION N/A 05/14/2017   Procedure: Loop Recorder Insertion;  Surgeon: Calvin Sprang, MD;  Location: [place] [facility];  Service: Cardiovascular;  Laterality: N/A;  . SKIN CANCER EXCISION     x2  . TONSILLECTOMY      There were no vitals filed for this visit.      Subjective Assessment - 09/17/17 1305    Subjective Patient joked throughout session, saying "I think that was a great answer!" after correctly completing a task.    Currently in Pain? No/denies               ADULT SLP TREATMENT - 09/17/17 0001      General Information   Behavior/Cognition Alert;Cooperative;Pleasant mood     Treatment Provided   Treatment provided Cognitive-Linquistic     Pain Assessment   Pain Assessment No/denies pain     Cognitive-Linquistic Treatment   Treatment focused on Aphasia   Skilled Treatment WORD-FINDING: listing 5 items in a category (abstract)-100% accuracy with moderate semantic feature cues; multiple definitions-100% accuracy independently. Add to the Category: 100% accuracy with moderate semantic cues. Tree surgeon: generated  grammatical and meaningful sentences throughout session     Assessment / Recommendations / [place] with current plan of care     Progression Toward Goals   Progression toward goals Progressing toward goals          SLP Education - 09/17/17 1305    Education provided Yes   Education Details use cues to assist with word-finding when having trouble    Person(s) Educated Patient   Methods Explanation   Comprehension Verbalized understanding          SLP Short Term Goals - 04/01/17 1015      SLP SHORT TERM GOAL #1   Baseline 04/01/17 - avg 8          SLP Long Term Goals - 08/30/17 1126      SLP LONG TERM GOAL #4   Title Patient will generate grammatical and meaningful sentences to complete moderate cognitive-linguistic task with 80% accruacy.   Time 8   Period Weeks   Status Partially Met   Target Date 09/24/17     SLP LONG TERM GOAL #5   Title Patient will complete high level word finding activities with 80% accuracy.   Time 8   Period Weeks   Status Partially Met   Target Date 09/24/17          Plan - 09/17/17 1308    Clinical Impression Statement The patient continues to demonstrate  improvement in the word finding multiple definitions task and was able to independently give two definitions accurately. Noted to have more difficulty with abstract word-finding activities as opposed to concrete, but still needing less cueing overall compared to previous sessions. Overall, the patient demonstrates fewer language errors including improper word usage, reduced flexibility for multiple meanings, and vague language.  He continues to improve in his usage of meaningful and grammatical sentences as well. The patient is eager to continue ST and can be expected to continue to make gains in self-expression   Speech Therapy Frequency 2x / week   Duration Other (comment)   Treatment/Interventions Cueing hierarchy;Functional tasks;SLP instruction and  feedback;Compensatory strategies;Patient/family education   Potential to Achieve Goals Good   Potential Considerations Ability to learn/carryover information;Previous level of function;Cooperation/participation level;Family/community support;Severity of impairments   SLP Home Exercise Plan complete definitions worksheet   Consulted and Agree with Plan of Care Patient      Patient will benefit from skilled therapeutic intervention in order to improve the following deficits and impairments:   Aphasia    Problem List Patient Active Problem List   Diagnosis Date Noted  . Stenosis of right carotid artery 02/01/2017  . Acute embolic stroke ([place]) 57/49/3552  . Bradycardia 11/10/2015  . Essential tremor 06/03/2015  . Dizziness 08/21/2012  . Syncope 04/10/2011  . CALF PAIN, LEFT 10/11/2010  . DYSPEPSIA 07/31/2010  . CHEST DISCOMFORT 07/31/2010  . Hyperlipidemia 05/16/2010  . HYPERTENSION, BENIGN 05/16/2010  . CAD, NATIVE VESSEL 05/16/2010    [name] 09/17/2017, 1:09 PM  [place] MAIN [facility] SERVICES [address] [place], [place], [address] Phone: [phone]   Fax:  [phone]   Name: [name]. MRN: [id number] Date of Birth: 11-08-1933

## 2017-09-17 NOTE — Therapy (Signed)
[facility] MAIN [facility] SERVICES [address] [place], [place], [address] Phone: [phone]   Fax:  [phone]  Occupational Therapy Treatment  Patient Details  Name: [name]. MRN: [id number] Date of Birth: [birth year] Referring Provider: Dr. Wilfrid Lund  Encounter Date: 09/17/2017      OT End of Session - 09/17/17 1019    Visit Number 47   Number of Visits 56   Date for OT Re-Evaluation 10/29/17   Authorization Type medicare G-code 7   Activity Tolerance Patient tolerated treatment well   Behavior During Therapy [facility] for tasks assessed/performed      Past Medical History:  Diagnosis Date  . BPH (benign prostatic hyperplasia)   . Coronary artery disease   . Hypertension     Past Surgical History:  Procedure Laterality Date  . Intestinal Blockage    . LOOP RECORDER INSERTION N/A 05/14/2017   Procedure: Loop Recorder Insertion;  Surgeon: Duke Salvia, MD;  Location: [facility] INVASIVE CV LAB;  Service: Cardiovascular;  Laterality: N/A;  . SKIN CANCER EXCISION     x2  . TONSILLECTOMY      There were no vitals filed for this visit.      Subjective Assessment - 09/17/17 1017    Subjective  Pt. just returned from vacation at the [facility] for one week.   Patient is accompained by: Family member   Pertinent History see epic.  L CVA, R hemiopsia, Parkinson's, essential tremor   Patient Stated Goals I want to get well and be perfect again. I hate these questions (pt unable to give specific answer)   Currently in Pain? No/denies       OT TREATMENT    Selfcare:  Pt. worked on sequencing his morning routine. Pt. required verbal cues, and increased time to problem solve, and for sequencing. Pt. education was provided about creating a daily list of sequencing tasks that he needs to do to prepare for the day. Pt. reports it takes him awhile to figure out what he needs to do, and where things are to get started. Pt.  reports this is worse if he has to go somewhere. Pt. reports he often will have to go into another room in order to figure what he needs to do next. Pt. worked on visual scanning strategies using event schedules.                           OT Education - 09/17/17 1018    Education provided Yes   Education Details visual scanning, visual, and cognitive compensatory techniques.   Person(s) Educated Patient   Methods Explanation   Comprehension Verbalized understanding             OT Long Term Goals - 08/13/17 1126      OT LONG TERM GOAL #1   Title Pt and wife will be mod I with ugraded HEP.   Time 12   Status On-going   Target Date 10/29/17     OT LONG TERM GOAL #5   Title Pt will require no more than 2 vc's to recall information from therapy session   Time 12   Period Weeks   Status On-going   Target Date 10/29/17     OT LONG TERM GOAL #6   Title Pt will complete table top scanning activity (not language based) efficiently with 100% accuracy   Time 12   Period Weeks   Status  Revised     OT LONG TERM GOAL #7   Title Pt will complete environmental scanning tasks efficiently with 100% accuracy    Time 12   Period Weeks   Status On-going     OT LONG TERM GOAL #8   Title Pt will perform simple divided attention with environmental scanning for improved safety for ambulation/in prep for driving with at least 16% accuracy.   Baseline Pt. will demonstrate visual compensatory strategies 100% of the time during ADLs, and IADLs.    Time 12   Period Weeks   Status On-going               Plan - 09/17/17 1020    Clinical Impression Statement Pt. reports he has noticed since returning from the beach, that he is having more difficulty remenbering where things are located, and his morning routine. Pt. reports his wife gets mad at him. Pt. continues to work on visual, and cognitive compensatory strategies. Pt. required cues, and increased time for sequencing  his morning routine.    Occupational performance deficits (Please refer to evaluation for details): ADL's;IADL's   Rehab Potential Good   Current Impairments/barriers affecting progress: decr awareness   OT Frequency 2x / week   OT Duration 12 weeks   OT Treatment/Interventions Self-care/ADL training;Electrical Stimulation;Therapeutic exercise;Neuromuscular education;DME and/or AE instruction;Energy conservation;Building services engineer;Therapeutic activities;Cognitive remediation/compensation;Visual/perceptual remediation/compensation;Patient/family education;Balance training   Consulted and Agree with Plan of Care Patient   Family Member Consulted wife Dois Davenport      Patient will benefit from skilled therapeutic intervention in order to improve the following deficits and impairments:  Decreased balance, Decreased cognition, Decreased coordination, Decreased safety awareness, Decreased mobility, Decreased knowledge of use of DME, Impaired UE functional use, Impaired vision/preception  Visit Diagnosis: Muscle weakness (generalized)    Problem List Patient Active Problem List   Diagnosis Date Noted  . Stenosis of right carotid artery 02/01/2017  . Acute embolic stroke (HCC) 02/01/2017  . Bradycardia 11/10/2015  . Essential tremor 06/03/2015  . Dizziness 08/21/2012  . Syncope 04/10/2011  . CALF PAIN, LEFT 10/11/2010  . DYSPEPSIA 07/31/2010  . CHEST DISCOMFORT 07/31/2010  . Hyperlipidemia 05/16/2010  . HYPERTENSION, BENIGN 05/16/2010  . CAD, NATIVE VESSEL 05/16/2010    Calvin Messier, MS, OTR/L 09/17/2017, 10:36 AM  [place] [facility] MAIN [facility] SERVICES [address] [place], [place], [address] Phone: [phone]   Fax:  [phone]  Name: [name] [facility]. MRN: [id number] Date of Birth: [birth year]

## 2017-09-18 ENCOUNTER — Telehealth: Payer: Self-pay | Admitting: Cardiovascular Disease

## 2017-09-18 NOTE — Telephone Encounter (Signed)
[employer] forms completed and placed in outgoing box.  

## 2017-09-18 NOTE — Telephone Encounter (Signed)
Completed [employer] forms, sent interoffice.

## 2017-09-20 ENCOUNTER — Ambulatory Visit: Payer: Medicare Other | Admitting: Speech Pathology

## 2017-09-20 ENCOUNTER — Encounter: Payer: Self-pay | Admitting: Speech Pathology

## 2017-09-20 ENCOUNTER — Ambulatory Visit: Payer: Medicare Other | Admitting: Occupational Therapy

## 2017-09-20 DIAGNOSIS — R4701 Aphasia: Secondary | ICD-10-CM | POA: Diagnosis not present

## 2017-09-20 DIAGNOSIS — R4189 Other symptoms and signs involving cognitive functions and awareness: Secondary | ICD-10-CM

## 2017-09-20 NOTE — Therapy (Signed)
[facility] MAIN [facility] SERVICES [address] [place], [place], [address] Phone: [phone]   Fax:  [phone]  Occupational Therapy Treatment  Patient Details  Name: [name]. MRN: [id number] Date of Birth: [birth year] Referring Provider: Dr. Wilfrid Lund  Encounter Date: 09/20/2017      OT End of Session - 09/20/17 1030    Visit Number 48   Number of Visits 56   Date for OT Re-Evaluation 10/29/17   Authorization Type medicare G-code 8   Activity Tolerance Patient tolerated treatment well   Behavior During Therapy [facility] for tasks assessed/performed      Past Medical History:  Diagnosis Date  . BPH (benign prostatic hyperplasia)   . Coronary artery disease   . Hypertension     Past Surgical History:  Procedure Laterality Date  . Intestinal Blockage    . LOOP RECORDER INSERTION N/A 05/14/2017   Procedure: Loop Recorder Insertion;  Surgeon: Duke Salvia, MD;  Location: [facility] INVASIVE CV LAB;  Service: Cardiovascular;  Laterality: N/A;  . SKIN CANCER EXCISION     x2  . TONSILLECTOMY      There were no vitals filed for this visit.      Subjective Assessment - 09/20/17 1024    Subjective  Pt. reports no changes since the last treatment session.   Patient is accompained by: Family member   Pertinent History see epic.  L CVA, R hemiopsia, Parkinson's, essential tremor   Patient Stated Goals I want to get well and be perfect again. I hate these questions (pt unable to give specific answer)   Currently in Pain? No/denies      OT TREATMENT    Selfcare:  Pt. worked on visual scanning tasks, visual search strategies, visual compensatory, and cognitive compensatory strategies for tabletop tasks. Pt. required increased time, and verbal cues for items to the far right.                           OT Education - 09/20/17 1029    Education provided Yes   Education Details visual compensatory  strategies   Person(s) Educated Patient   Methods Explanation   Comprehension Verbalized understanding            OT Long Term Goals - 08/13/17 1126      OT LONG TERM GOAL #1   Title Pt and wife will be mod I with ugraded HEP.   Time 12   Status On-going   Target Date 10/29/17     OT LONG TERM GOAL #5   Title Pt will require no more than 2 vc's to recall information from therapy session   Time 12   Period Weeks   Status On-going   Target Date 10/29/17     OT LONG TERM GOAL #6   Title Pt will complete table top scanning activity (not language based) efficiently with 100% accuracy   Time 12   Period Weeks   Status Revised     OT LONG TERM GOAL #7   Title Pt will complete environmental scanning tasks efficiently with 100% accuracy    Time 12   Period Weeks   Status On-going     OT LONG TERM GOAL #8   Title Pt will perform simple divided attention with environmental scanning for improved safety for ambulation/in prep for driving with at least 69% accuracy.   Baseline Pt. will demonstrate visual compensatory strategies 100% of  the time during ADLs, and IADLs.    Time 12   Period Weeks   Status On-going               Plan - 09/20/17 1030    Clinical Impression Statement Pt. continues to require increased time, and verbal prompts for visual scanning to the far right, and cognitive compensatory tasks. Pt. continues to work on improving visual , and cognitive compensatory skills for improved ADLs, and IADLs..   Occupational performance deficits (Please refer to evaluation for details): ADL's;IADL's   Rehab Potential Good   OT Frequency 2x / week   OT Duration 12 weeks   OT Treatment/Interventions Self-care/ADL training;Electrical Stimulation;Therapeutic exercise;Neuromuscular education;DME and/or AE instruction;Energy conservation;Building services engineerunctional Mobility Training;Therapeutic activities;Cognitive remediation/compensation;Visual/perceptual  remediation/compensation;Patient/family education;Balance training   Consulted and Agree with Plan of Care Patient      Patient will benefit from skilled therapeutic intervention in order to improve the following deficits and impairments:  Decreased balance, Decreased cognition, Decreased coordination, Decreased safety awareness, Decreased mobility, Decreased knowledge of use of DME, Impaired UE functional use, Impaired vision/preception  Visit Diagnosis: Impaired cognition    Problem List Patient Active Problem List   Diagnosis Date Noted  . Stenosis of right carotid artery 02/01/2017  . Acute embolic stroke (HCC) 02/01/2017  . Bradycardia 11/10/2015  . Essential tremor 06/03/2015  . Dizziness 08/21/2012  . Syncope 04/10/2011  . CALF PAIN, LEFT 10/11/2010  . DYSPEPSIA 07/31/2010  . CHEST DISCOMFORT 07/31/2010  . Hyperlipidemia 05/16/2010  . HYPERTENSION, BENIGN 05/16/2010  . CAD, NATIVE VESSEL 05/16/2010    Olegario MessierElaine Ac Colan, MS, OTR/L 09/20/2017, 10:39 AM  [facility] [address] [place], [place], [address] Phone: [phone]   Fax:  [phone]  Name: Calvin ShortsGeorge [facility] Jr. MRN: [id number] Date of Birth: [birth year]

## 2017-09-20 NOTE — Therapy (Signed)
[place] MAIN [facility] SERVICES [address] [place], [place], [address] Phone: [phone]   Fax:  [phone]  Speech Language Pathology Treatment  Patient Details  Name: [name]. MRN: [id number] Date of Birth: 01/19/1933 Referring Provider: Launa Grill, MD  Encounter Date: 09/20/2017      End of Session - 09/20/17 1203    Visit Number 54   Number of Visits 61   Date for SLP Re-Evaluation 09/24/17   SLP Start Time 1100   SLP Stop Time  1147   SLP Time Calculation (min) 47 min   Activity Tolerance Patient tolerated treatment well      Past Medical History:  Diagnosis Date  . BPH (benign prostatic hyperplasia)   . Coronary artery disease   . Hypertension     Past Surgical History:  Procedure Laterality Date  . Intestinal Blockage    . LOOP RECORDER INSERTION N/A 05/14/2017   Procedure: Loop Recorder Insertion;  Surgeon: [name], MD;  Location: [place] CV LAB;  Service: Cardiovascular;  Laterality: N/A;  . SKIN CANCER EXCISION     x2  . TONSILLECTOMY      There were no vitals filed for this visit.      Subjective Assessment - 09/20/17 1202    Subjective Patient joked throughout session, saying "I think that was a great answer!" after correctly completing a task.    Currently in Pain? No/denies               ADULT SLP TREATMENT - 09/20/17 0001      General Information   Behavior/Cognition Alert;Cooperative;Pleasant mood     Treatment Provided   Treatment provided Cognitive-Linquistic     Pain Assessment   Pain Assessment No/denies pain     Cognitive-Linquistic Treatment   Treatment focused on Aphasia   Skilled Treatment WORD FINDING: Complete a variety of word finding activities (name abstract categories, verbal analogies) with overall 80% accuracy independently.  LANGUAGE: Generate full, complete description of complex picture with 80% accuracy independently.  Patient able to  answer questions to verbalize more abstract aspect of the picture.     Assessment / Recommendations / Plan   Plan Continue with current plan of care     Progression Toward Goals   Progression toward goals Progressing toward goals          SLP Education - 09/20/17 1202    Education provided Yes   Education Details word finding strategies   Person(s) Educated Patient   Methods Explanation   Comprehension Verbalized understanding          SLP Short Term Goals - 04/01/17 1015      SLP SHORT TERM GOAL #1   Baseline 04/01/17 - avg 8          SLP Long Term Goals - 08/30/17 1126      SLP LONG TERM GOAL #4   Title Patient will generate grammatical and meaningful sentences to complete moderate cognitive-linguistic task with 80% accruacy.   Time 8   Period Weeks   Status Partially Met   Target Date 09/24/17     SLP LONG TERM GOAL #5   Title Patient will complete high level word finding activities with 80% accuracy.   Time 8   Period Weeks   Status Partially Met   Target Date 09/24/17          Plan - 09/20/17 1203    Clinical Impression Statement The patient is  improving in specific word finding tasks and demonstrates improved spontaneous speech information content.  The patient continues to express frustration that it takes him longer to complete these relatively simple language tasks.  Overall, the patient demonstrates fewer language errors including improper word usage, reduced flexibility for multiple meanings, and vague language.    Speech Therapy Frequency 2x / week   Duration Other (comment)   Treatment/Interventions Cueing hierarchy;Functional tasks;SLP instruction and feedback;Compensatory strategies;Patient/family education   Potential to Achieve Goals Good   Potential Considerations Ability to learn/carryover information;Previous level of function;Cooperation/participation level;Family/community support;Severity of impairments   Consulted and Agree with Plan  of Care Patient      Patient will benefit from skilled therapeutic intervention in order to improve the following deficits and impairments:   Aphasia    Problem List Patient Active Problem List   Diagnosis Date Noted  . Stenosis of right carotid artery 02/01/2017  . Acute embolic stroke ([place]) 09/40/7680  . Bradycardia 11/10/2015  . Essential tremor 06/03/2015  . Dizziness 08/21/2012  . Syncope 04/10/2011  . CALF PAIN, LEFT 10/11/2010  . DYSPEPSIA 07/31/2010  . CHEST DISCOMFORT 07/31/2010  . Hyperlipidemia 05/16/2010  . HYPERTENSION, BENIGN 05/16/2010  . CAD, NATIVE VESSEL 05/16/2010   Leroy Sea, MS/CCC- SLP  Lou Miner 09/20/2017, 12:06 PM  [place] MAIN [facility] SERVICES [address] [place], [place], [address] Phone: [phone]   Fax:  [phone]   Name: [name] [facility]. MRN: [id number] Date of Birth: 04/10/1933

## 2017-09-24 ENCOUNTER — Encounter: Payer: Self-pay | Admitting: Speech Pathology

## 2017-09-24 ENCOUNTER — Ambulatory Visit: Payer: Medicare Other | Admitting: Speech Pathology

## 2017-09-24 ENCOUNTER — Ambulatory Visit: Payer: Medicare Other | Admitting: Occupational Therapy

## 2017-09-24 DIAGNOSIS — M6281 Muscle weakness (generalized): Secondary | ICD-10-CM

## 2017-09-24 DIAGNOSIS — R4701 Aphasia: Secondary | ICD-10-CM

## 2017-09-24 DIAGNOSIS — R278 Other lack of coordination: Secondary | ICD-10-CM

## 2017-09-24 NOTE — Therapy (Signed)
[place] MAIN [facility] SERVICES [address] [place], [place], [address] Phone: [phone]   Fax:  [phone]  Discharge Summary  Patient Details  Name: [name]. MRN: [id number] Date of Birth: [birth year] Referring Provider: Launa Grill, MD  Encounter Date: 09/24/2017      End of Session - 09/24/17 1254    Visit Number 55   Number of Visits 19   Date for SLP Re-Evaluation 09/24/17   SLP Start Time 1100   SLP Stop Time  3557   SLP Time Calculation (min) 58 min   Activity Tolerance Patient tolerated treatment well      Past Medical History:  Diagnosis Date  . BPH (benign prostatic hyperplasia)   . Coronary artery disease   . Hypertension     Past Surgical History:  Procedure Laterality Date  . Intestinal Blockage    . LOOP RECORDER INSERTION N/A 05/14/2017   Procedure: Loop Recorder Insertion;  Surgeon: [name], MD;  Location: [place] CV LAB;  Service: Cardiovascular;  Laterality: N/A;  . SKIN CANCER EXCISION     x2  . TONSILLECTOMY      There were no vitals filed for this visit.      Subjective Assessment - 09/24/17 1253    Subjective Patient responded "that's what they tell me, that I'm ready to graduate" upon acknowledgment of last session   Currently in Pain? No/denies               ADULT SLP TREATMENT - 09/24/17 0001      General Information   Behavior/Cognition Alert;Cooperative;Pleasant mood     Treatment Provided   Treatment provided Cognitive-Linquistic     Pain Assessment   Pain Assessment No/denies pain     Cognitive-Linquistic Treatment   Treatment focused on Aphasia   Skilled Treatment WORD FINDING: Complete a variety of word finding activities (list category members-abstract, word deduction) with overall 89% accuracy independently.  LANGUAGE: Generate full, complete answers to complex questions with 85% accuracy independently.       Assessment /  Recommendations / Plan   Plan All goals met     Progression Toward Goals   Progression toward goals Goals met, education completed, patient discharged from [place] Education - 09/24/17 1253    Education provided Yes   Education Details use strategies if having difficulty; be confident in abilities   Person(s) Educated Patient   Methods Explanation   Comprehension Verbalized understanding         SLP Long Term Goals - 09/24/17 1256      SLP LONG TERM GOAL #4   Title Patient will generate grammatical and meaningful sentences to complete moderate cognitive-linguistic task with 80% accruacy.   Time 8   Period Weeks   Status Achieved     SLP LONG TERM GOAL #5   Title Patient will complete high level word finding activities with 80% accuracy.   Time 8   Period Weeks   Status Achieved          Plan - 09/24/17 1254    Clinical Impression Statement The patient has improved in specific word finding tasks and demonstrates improved spontaneous speech information content. Patient has met goals, was provided with activities to take home should he feel the need to work on skills, and was reminded that he has strategies to use should he need to, but is doing just fine. Overall, the  patient demonstrates significantly improved word-finding and sentence construction skills and would no longer benefit from [place].   Speech Therapy Frequency Other-discharge   Duration Other-discharge   Treatment/Interventions Cueing hierarchy;Functional tasks;SLP instruction and feedback;Compensatory strategies;Patient/family education   Potential to Achieve Goals Good   Potential Considerations Ability to learn/carryover information;Previous level of function;Cooperation/participation level;Family/community support;Severity of impairments   SLP Home Exercise Plan complete activities in "forever homework" folder if ever needed   Consulted and Agree with Plan of Care Patient      Patient will benefit  from skilled therapeutic intervention in order to improve the following deficits and impairments:   Aphasia    Problem List Patient Active Problem List   Diagnosis Date Noted  . Stenosis of right carotid artery 02/01/2017  . Acute embolic stroke ([place]) 20/23/3435  . Bradycardia 11/10/2015  . Essential tremor 06/03/2015  . Dizziness 08/21/2012  . Syncope 04/10/2011  . CALF PAIN, LEFT 10/11/2010  . DYSPEPSIA 07/31/2010  . CHEST DISCOMFORT 07/31/2010  . Hyperlipidemia 05/16/2010  . HYPERTENSION, BENIGN 05/16/2010  . CAD, NATIVE VESSEL 05/16/2010    Tearia Gibbs [place] 09/24/2017, 12:59 PM  [place] MAIN [facility] SERVICES [address] [place], [place], [address] Phone: [phone]   Fax:  [phone]   Name: [name] [facility]. MRN: [id number] Date of Birth: February 23, 1933

## 2017-09-24 NOTE — Therapy (Signed)
[place] MAIN [facility] SERVICES [address] [place], [place], [address] Phone: [phone]   Fax:  [phone]  Occupational Therapy Treatment  Patient Details  Name: [name]. MRN: [id number] Date of Birth: 1933/08/04 Referring Provider: Dr. Rea College  Encounter Date: 09/24/2017      OT End of Session - 09/24/17 1016    Visit Number 92   Number of Visits 70   Date for OT Re-Evaluation 10/29/17   Authorization Type medicare G-code 9   OT Start Time 1000   OT Stop Time 1045   OT Time Calculation (min) 45 min   Activity Tolerance Patient tolerated treatment well   Behavior During Therapy WFL for tasks assessed/performed      Past Medical History:  Diagnosis Date  . BPH (benign prostatic hyperplasia)   . Coronary artery disease   . Hypertension     Past Surgical History:  Procedure Laterality Date  . Intestinal Blockage    . LOOP RECORDER INSERTION N/A 05/14/2017   Procedure: Loop Recorder Insertion;  Surgeon: [name], MD;  Location: [place] [facility];  Service: Cardiovascular;  Laterality: N/A;  . SKIN CANCER EXCISION     x2  . TONSILLECTOMY      There were no vitals filed for this visit.      Subjective Assessment - 09/24/17 1013    Subjective  Pt. reports no shanges since being seen last   Patient is accompained by: Family member   Patient Stated Goals I want to get well and be perfect again. I hate these questions (pt unable to give specific answer)   Currently in Pain? No/denies     OT TREATMENT    Selfcare:  Pt. worked on the IADL of telling time. Pt. required cues to for the hour hand, and extensive cues for the minute hands. Pt. was able to tell digital time, however had difficulty with a face clock. Pt. Required increased time, and extensive cues.                            OT Education - 09/24/17 1016    Education provided Yes   Education Details [facility]  skills   Person(s) Educated Patient   Methods Explanation   Comprehension Verbalized understanding          OT Short Term Goals - 02/14/17 1457      OT SHORT TERM GOAL #1   Title Pt and wife will be mod I with HEP - 02/21/2017   Time 4   Period Weeks   Status On-going  needs reinforcement of coordination HEP     OT SHORT TERM GOAL #2   Title Pt will be mod I with shower transfers   Status Achieved  met per pt report     OT SHORT TERM GOAL #3   Title Pt will be mod I with bathing   Status Achieved     OT SHORT TERM GOAL #4   Title Pt will complete table top scanning activity (not lanaguage or letter based) with 75% accuracy   Status Achieved  met for copying small peg design with min v.c.     OT SHORT TERM GOAL #5   Title Pt will attend to familiar task in busy environment with no more than min vc's.     Status Achieved           OT Long Term Goals -  08/13/17 1126      OT LONG TERM GOAL #1   Title Pt and wife will be mod I with ugraded HEP.   Time 12   Status On-going   Target Date 10/29/17     OT LONG TERM GOAL #5   Title Pt will require no more than 2 vc's to recall information from therapy session   Time 12   Period Weeks   Status On-going   Target Date 10/29/17     OT LONG TERM GOAL #6   Title Pt will complete table top scanning activity (not language based) efficiently with 100% accuracy   Time 12   Period Weeks   Status Revised     OT LONG TERM GOAL #7   Title Pt will complete environmental scanning tasks efficiently with 100% accuracy    Time 12   Period Weeks   Status On-going     OT LONG TERM GOAL #8   Title Pt will perform simple divided attention with environmental scanning for improved safety for ambulation/in prep for driving with at least 75% accuracy.   Baseline Pt. will demonstrate visual compensatory strategies 100% of the time during ADLs, and IADLs.    Time 12   Period Weeks   Status On-going               Plan -  09/24/17 1021    Clinical Impression Statement Pt. continues to present with cognitive IADL deficits, and visual impairments which hinder his ability to complete ADL, and IADL functioning. Pt. has difficulty with telling time on a standard face clock. Pt. requires continues to work on improving cognitive IADL compensatory strategies, and visual compensatory strategies.    Occupational performance deficits (Please refer to evaluation for details): ADL's;IADL's   Rehab Potential Good   Current Impairments/barriers affecting progress: decr awareness   OT Frequency 2x / week   OT Duration 12 weeks   OT Treatment/Interventions Self-care/ADL training;Electrical Stimulation;Therapeutic exercise;Neuromuscular education;DME and/or AE instruction;Energy conservation;Therapist, nutritional;Therapeutic activities;Cognitive remediation/compensation;Visual/perceptual remediation/compensation;Patient/family education;Balance training   Consulted and Agree with Plan of Care Patient      Patient will benefit from skilled therapeutic intervention in order to improve the following deficits and impairments:  Decreased balance, Decreased cognition, Decreased coordination, Decreased safety awareness, Decreased mobility, Decreased knowledge of use of DME, Impaired UE functional use, Impaired vision/preception  Visit Diagnosis: Muscle weakness (generalized)  Other lack of coordination    Problem List Patient Active Problem List   Diagnosis Date Noted  . Stenosis of right carotid artery 02/01/2017  . Acute embolic stroke ([place]) 24/23/5361  . Bradycardia 11/10/2015  . Essential tremor 06/03/2015  . Dizziness 08/21/2012  . Syncope 04/10/2011  . CALF PAIN, LEFT 10/11/2010  . DYSPEPSIA 07/31/2010  . CHEST DISCOMFORT 07/31/2010  . Hyperlipidemia 05/16/2010  . HYPERTENSION, BENIGN 05/16/2010  . CAD, NATIVE VESSEL 05/16/2010    Harrel Carina, MS, OTR/L 09/24/2017, 10:41 AM  [place] MAIN [facility] SERVICES [address] [place], [place], [address] Phone: [phone]   Fax:  [phone]  Name: [name] [facility]. MRN: [id number] Date of Birth: [birth year]

## 2017-09-27 ENCOUNTER — Ambulatory Visit: Payer: Medicare Other | Admitting: Occupational Therapy

## 2017-10-01 ENCOUNTER — Ambulatory Visit: Payer: Medicare Other | Admitting: Occupational Therapy

## 2017-10-01 DIAGNOSIS — H5347 Heteronymous bilateral field defects: Secondary | ICD-10-CM

## 2017-10-01 DIAGNOSIS — R4701 Aphasia: Secondary | ICD-10-CM | POA: Diagnosis not present

## 2017-10-01 DIAGNOSIS — I69318 Other symptoms and signs involving cognitive functions following cerebral infarction: Secondary | ICD-10-CM

## 2017-10-01 NOTE — Therapy (Signed)
[place] MAIN [facility] SERVICES [address] [place], [place], [address] Phone: [phone]   Fax:  [phone]  Occupational Therapy Treatment/G- Code Note  Patient Details  Name: Calvin Caine [facility]. MRN: [id number] Date of Birth: 10/01/1933 Referring Provider: Dr. Rea College  Encounter Date: 10/01/2017      OT End of Session - 10/01/17 1011    Visit Number 50   Number of Visits 70   Date for OT Re-Evaluation 10/29/17   Authorization Type medicare G-code 10   Authorization Time Period 60 days   OT Start Time 1000   OT Stop Time 1045   OT Time Calculation (min) 45 min   Activity Tolerance Patient tolerated treatment well   Behavior During Therapy [facility] for tasks assessed/performed      Past Medical History:  Diagnosis Date  . BPH (benign prostatic hyperplasia)   . Coronary artery disease   . Hypertension     Past Surgical History:  Procedure Laterality Date  . Intestinal Blockage    . LOOP RECORDER INSERTION N/A 05/14/2017   Procedure: Loop Recorder Insertion;  Surgeon: [name], MD;  Location: [place] [facility];  Service: Cardiovascular;  Laterality: N/A;  . SKIN CANCER EXCISION     x2  . TONSILLECTOMY      There were no vitals filed for this visit.      Subjective Assessment - 10/01/17 1007    Subjective  Pt. reports that he finished with speech therapy.   Patient is accompained by: Family member   Pertinent History see epic.  L CVA, R hemiopsia, Parkinson's, essential tremor   Currently in Pain? No/denies   Pain Score 1    Pain Location Back   Pain Orientation Lower   Pain Descriptors / Indicators Aching      OT TREATMENT    Selfcare:  Reviewed, and modified goals with pt. Pt. worked on typing tasks. Pt. was able to complete 311 char. In 5 min., with 58% accuracy, 12 wpm with 16 errors, with an adjusted wpm of 7. Pt. Education was provided about visual compensation  strategies.                             OT Education - 10/01/17 1010    Education provided Yes   Education Details visual, and cognitive compensatory strategies.   Person(s) Educated Patient   Methods Explanation   Comprehension Verbalized understanding          OT Short Term Goals - 02/14/17 1457      OT SHORT TERM GOAL #1   Title Pt and wife will be mod I with HEP - 02/21/2017   Time 4   Period Weeks   Status On-going  needs reinforcement of coordination HEP     OT SHORT TERM GOAL #2   Title Pt will be mod I with shower transfers   Status Achieved  met per pt report     OT SHORT TERM GOAL #3   Title Pt will be mod I with bathing   Status Achieved     OT SHORT TERM GOAL #4   Title Pt will complete table top scanning activity (not lanaguage or letter based) with 75% accuracy   Status Achieved  met for copying small peg design with min v.c.     OT SHORT TERM GOAL #5   Title Pt will attend to familiar task in busy environment  with no more than min vc's.     Status Achieved           OT Long Term Goals - 10-07-17 1014      OT LONG TERM GOAL #1   Title Pt and wife will be mod I with ugraded HEP.   Time 12   Period Weeks   Status On-going   Target Date 10/29/17     OT LONG TERM GOAL #5   Title Pt will require no more than 2 vc's to recall information from therapy session   Time 12   Period Weeks   Status Achieved   Target Date 10/29/17     OT LONG TERM GOAL #6   Title October 07, 2017: Improving. Pt. has difficulty with scanning items in the far right.   Time 12   Period Weeks   Status On-going   Target Date 10/29/17     OT LONG TERM GOAL #7   Title Pt will complete environmental scanning tasks efficiently with 100% accuracy    Baseline 10/07/2017: Improved environmental scanning.   Time 12   Period Weeks   Status On-going   Target Date 10/29/17     OT LONG TERM GOAL #8   Title Pt will perform simple divided attention with  environmental scanning for improved safety for ambulation/in prep for driving with at least 75% accuracy.   Baseline Pt. will demonstrate visual compensatory strategies 100% of the time during ADLs, and IADLs.    Time 12   Status Achieved     OT LONG TERM GOAL  #9   Baseline Pt. will demonstrate visual, and cognitive ompensatory strategies for office tasks including typing, and reading.    Time 4   Period Weeks   Target Date 10/29/17               Plan - 10-07-2017 1011    Clinical Impression Statement Pt. reports that things take him longer at home than they used to. Pt. reports he feels like he can do enough to exist, but is not capable tof doing much else, or work. Pt. reports he goes to the office, and looks through his emaill, however it takes him a long time to type responses, and for reading. Pt. conitnues to benefit from skilled OT services to work on improving functional IADL tasks.   Occupational performance deficits (Please refer to evaluation for details): ADL's;IADL's   Rehab Potential Good   OT Frequency 2x / week   OT Duration 12 weeks   OT Treatment/Interventions Self-care/ADL training;Electrical Stimulation;Therapeutic exercise;Neuromuscular education;DME and/or AE instruction;Energy conservation;Therapist, nutritional;Therapeutic activities;Cognitive remediation/compensation;Visual/perceptual remediation/compensation;Patient/family education;Balance training   Consulted and Agree with Plan of Care Patient   Family Member Consulted wife [name]      Patient will benefit from skilled therapeutic intervention in order to improve the following deficits and impairments:  Decreased balance, Decreased cognition, Decreased coordination, Decreased safety awareness, Decreased mobility, Decreased knowledge of use of DME, Impaired UE functional use, Impaired vision/preception  Visit Diagnosis: Hemianopsia  Other symptoms and signs involving cognitive functions following  cerebral infarction      G-Codes - 10-07-2017 1048    Functional Assessment Tool Used (Outpatient only) skilled clinical observation, clinical judgement   Functional Limitation Self care   Self Care Current Status (X5056) At least 1 percent but less than 20 percent impaired, limited or restricted   Self Care Goal Status (P7948) 0 percent impaired, limited or restricted      Problem List Patient Active  Problem List   Diagnosis Date Noted  . Stenosis of right carotid artery 02/01/2017  . Acute embolic stroke ([place]) 24/26/8341  . Bradycardia 11/10/2015  . Essential tremor 06/03/2015  . Dizziness 08/21/2012  . Syncope 04/10/2011  . CALF PAIN, LEFT 10/11/2010  . DYSPEPSIA 07/31/2010  . CHEST DISCOMFORT 07/31/2010  . Hyperlipidemia 05/16/2010  . HYPERTENSION, BENIGN 05/16/2010  . CAD, NATIVE VESSEL 05/16/2010    Harrel Carina, MS, OTR/L 10/01/2017, 10:59 AM  [place] MAIN [facility] SERVICES [address] [place], [place], [address] Phone: [phone]   Fax:  [phone]  Name: Calvin Falkner [facility]. MRN: [id number] Date of Birth: 1933-10-31

## 2017-10-03 ENCOUNTER — Ambulatory Visit: Payer: Medicare Other | Attending: Internal Medicine | Admitting: Occupational Therapy

## 2017-10-03 DIAGNOSIS — M6281 Muscle weakness (generalized): Secondary | ICD-10-CM | POA: Diagnosis present

## 2017-10-03 DIAGNOSIS — H5347 Heteronymous bilateral field defects: Secondary | ICD-10-CM | POA: Insufficient documentation

## 2017-10-03 NOTE — Therapy (Signed)
[place] [facility] MAIN [facility] SERVICES [address] [place], [place], [address] Phone: [phone]   Fax:  [phone]  Occupational Therapy Treatment  Patient Details  Name: [name]. MRN: [id number] Date of Birth: 09-15-1933 Referring Provider: Dr. Wilfrid Lund  Encounter Date: 10/03/2017      OT End of Session - 10/03/17 1006    Visit Number 51   Number of Visits 70   Date for OT Re-Evaluation 10/29/17   Authorization Type medicare G-code 1   Activity Tolerance Patient tolerated treatment well   Behavior During Therapy [facility] for tasks assessed/performed      Past Medical History:  Diagnosis Date  . BPH (benign prostatic hyperplasia)   . Coronary artery disease   . Hypertension     Past Surgical History:  Procedure Laterality Date  . Intestinal Blockage    . LOOP RECORDER INSERTION N/A 05/14/2017   Procedure: Loop Recorder Insertion;  Surgeon: Duke Salvia, MD;  Location: [facility];  Service: Cardiovascular;  Laterality: N/A;  . SKIN CANCER EXCISION     x2  . TONSILLECTOMY      There were no vitals filed for this visit.      Subjective Assessment - 10/03/17 1005    Subjective  Pt. rpeorts that his wife is still out of town, and will come back on Sunday.   Patient is accompained by: Family member   Pertinent History see epic.  L CVA, R hemiopsia, Parkinson's, essential tremor   Patient Stated Goals I want to get well and be perfect again. I hate these questions (pt unable to give specific answer)   Currently in Pain? No/denies     OT TREATMENT    Selfcare:  Pt. worked on reading form a novel. Pt. worked on visual compensatory strategies during reading. Pt. uses his finger to to guide his place, as he scans the text. Pt. Has more difficulty following short text sentence, and paragraphs with a lot of quotations. Pt. worked on visual saccades from the left to right, with cues for multiple omissions to the  far right.                              OT Education - 10/03/17 1006    Education provided Yes   Education Details Visual compensatory Strategies with reading.   Person(s) Educated Patient   Methods Explanation   Comprehension Verbalized understanding            OT Long Term Goals - 10/01/17 1014      OT LONG TERM GOAL #1   Title Pt and wife will be mod I with ugraded HEP.   Time 12   Period Weeks   Status On-going   Target Date 10/29/17     OT LONG TERM GOAL #5   Title Pt will require no more than 2 vc's to recall information from therapy session   Time 12   Period Weeks   Status Achieved   Target Date 10/29/17     OT LONG TERM GOAL #6   Title 10/01/2017: Improving. Pt. has difficulty with scanning items in the far right.   Time 12   Period Weeks   Status On-going   Target Date 10/29/17     OT LONG TERM GOAL #7   Title Pt will complete environmental scanning tasks efficiently with 100% accuracy    Baseline 10/01/2017: Improved environmental scanning.  Time 12   Period Weeks   Status On-going   Target Date 10/29/17     OT LONG TERM GOAL #8   Title Pt will perform simple divided attention with environmental scanning for improved safety for ambulation/in prep for driving with at least 81%75% accuracy.   Baseline Pt. will demonstrate visual compensatory strategies 100% of the time during ADLs, and IADLs.    Time 12   Status Achieved     OT LONG TERM GOAL  #9   Baseline Pt. will demonstrate visual, and cognitive ompensatory strategies for office tasks including typing, and reading.    Time 4   Period Weeks   Target Date 10/29/17               Plan - 10/03/17 1007    Clinical Impression Statement Pt. reports he is staying home alone until Sunday when his wife returns. Pt. reports he has to do some "figuring out" for for his mornig routine. Pt. worked on visual compensatory strategies during tabletop tasks, and reading.    Occupational performance deficits (Please refer to evaluation for details): ADL's;IADL's   Rehab Potential Good   OT Frequency 2x / week   OT Duration 12 weeks   OT Treatment/Interventions Self-care/ADL training;Electrical Stimulation;Therapeutic exercise;Neuromuscular education;DME and/or AE instruction;Energy conservation;Building services engineerunctional Mobility Training;Therapeutic activities;Cognitive remediation/compensation;Visual/perceptual remediation/compensation;Patient/family education;Balance training   Consulted and Agree with Plan of Care Patient      Patient will benefit from skilled therapeutic intervention in order to improve the following deficits and impairments:  Decreased balance, Decreased cognition, Decreased coordination, Decreased safety awareness, Decreased mobility, Decreased knowledge of use of DME, Impaired UE functional use, Impaired vision/preception  Visit Diagnosis: Hemianopsia    Problem List Patient Active Problem List   Diagnosis Date Noted  . Stenosis of right carotid artery 02/01/2017  . Acute embolic stroke ([facility]) 02/01/2017  . Bradycardia 11/10/2015  . Essential tremor 06/03/2015  . Dizziness 08/21/2012  . Syncope 04/10/2011  . CALF PAIN, LEFT 10/11/2010  . DYSPEPSIA 07/31/2010  . CHEST DISCOMFORT 07/31/2010  . Hyperlipidemia 05/16/2010  . HYPERTENSION, BENIGN 05/16/2010  . CAD, NATIVE VESSEL 05/16/2010    Olegario MessierElaine Debar Plate, MS, OTR/L 10/03/2017, 10:28 AM  [place] [facility] [address] [place], [place], [address] Phone: [phone]   Fax:  [phone]  Name: Scarlette ShortsGeorge Stanford [facility] MRN: [id number] Date of Birth: 1933-04-29

## 2017-10-07 ENCOUNTER — Ambulatory Visit: Payer: Medicare Other | Admitting: Occupational Therapy

## 2017-10-07 ENCOUNTER — Encounter: Payer: Medicare Other | Admitting: Speech Pathology

## 2017-10-09 ENCOUNTER — Ambulatory Visit: Payer: Medicare Other | Admitting: Occupational Therapy

## 2017-10-09 ENCOUNTER — Encounter: Payer: Medicare Other | Admitting: Speech Pathology

## 2017-10-09 ENCOUNTER — Encounter: Payer: Self-pay | Admitting: Occupational Therapy

## 2017-10-09 DIAGNOSIS — M6281 Muscle weakness (generalized): Secondary | ICD-10-CM

## 2017-10-09 DIAGNOSIS — H5347 Heteronymous bilateral field defects: Secondary | ICD-10-CM | POA: Diagnosis not present

## 2017-10-09 NOTE — Therapy (Signed)
[place] MAIN [facility] SERVICES [address] [place], [place], [address] Phone: [phone]   Fax:  [phone]  Occupational Therapy Treatment/Discharge Note  Patient Details  Name: [name]. MRN: [id number] Date of Birth: 07/10/1933 Referring Provider: Dr. Rea College   Encounter Date: 10/09/2017  OT End of Session - 10/09/17 1026    Visit Number  55    Number of Visits  41    Date for OT Re-Evaluation  10/29/17    Authorization Type  medicare G-code 2    OT Start Time  1000    OT Stop Time  1045    OT Time Calculation (min)  45 min    Activity Tolerance  Patient tolerated treatment well    Behavior During Therapy  WFL for tasks assessed/performed       Past Medical History:  Diagnosis Date  . BPH (benign prostatic hyperplasia)   . Coronary artery disease   . Hypertension     Past Surgical History:  Procedure Laterality Date  . Intestinal Blockage    . SKIN CANCER EXCISION     x2  . TONSILLECTOMY      There were no vitals filed for this visit.  Subjective Assessment - 10/09/17 1020    Subjective  Pt. reports that his wife, and daughter will not let him drive on the highway.   Patient is accompained by:  Family member    Currently in Pain?  No/denies      OT TREATMENT    Selfcare:  Pt. worked on saccades from left to right with increasing width distance. In preparation for ADL, and IADL tasks. Pt. was able to complete, however had fewer omissions to the far right. Pt. worked on visual scanning for tabletop tasks using maps. Pt. continues to require increased time. However, pt. required less time to complete.                      OT Education - 10/09/17 1022    Education provided  Yes    Education Details  visual, and cognitive compensatory strategies.    Comprehension  Verbalized understanding         OT Long Term Goals - 10/09/17 1044      OT LONG TERM GOAL #1   Title  Pt and  wife will be mod I with ugraded HEP.    Time  12    Period  Weeks    Status  Achieved    Target Date  10/09/17      OT LONG TERM GOAL #7   Title  Pt will complete environmental scanning tasks efficiently with 100% accuracy     Baseline  10/01/2017: Improved environmental scanning. 10/09/2017: omissions to the far right    Time  12    Period  Weeks    Status  Partially Met    Target Date  10/09/17      OT LONG TERM GOAL  #9   Baseline  Pt. will demonstrate visual, and cognitive ompensatory strategies for office tasks including typing, and reading.     Time  4    Period  Weeks    Status  Achieved    Target Date  10/09/17            Plan - 10/09/17 1027    Clinical Impression Statement  Pt. reports having had an appointment yesterday at the [place]. Pt. has made progress since the initial evaluation,  and is now ready for discharge. Pt. education has been provided about visual, and cognitive compensatory strategies during ADLs, and IADLs. Pt. presents with omissions to the far right. Pt. continues to report that he requires cues to initiate his morning routine, and increased time to figure out what he needs to be doing to start his day. Pt. is able to scan his environment for in preparation for navigating his environment, and for tabletop, and reading tasks. Pt. is able to read the text, however reports having to read it over several times to understand, and recall the information. Pt. is now ready for disacharge from OT services. Pt. is in agreement.    Occupational performance deficits (Please refer to evaluation for details):  ADL's;IADL's    Rehab Potential  Good    Current Impairments/barriers affecting progress:  decr awareness    OT Frequency  2x / week    OT Duration  12 weeks    OT Treatment/Interventions  Self-care/ADL training;Electrical Stimulation;Therapeutic exercise;Neuromuscular education;DME and/or AE instruction;Energy conservation;Therapist, nutritional;Therapeutic  activities;Cognitive remediation/compensation;Visual/perceptual remediation/compensation;Patient/family education;Balance training    Consulted and Agree with Plan of Care  Patient       Patient will benefit from skilled therapeutic intervention in order to improve the following deficits and impairments:  Decreased balance, Decreased cognition, Decreased coordination, Decreased safety awareness, Decreased mobility, Decreased knowledge of use of DME, Impaired UE functional use, Impaired vision/preception  Visit Diagnosis: Muscle weakness (generalized)  Hemianopsia  G-Codes - 31-Oct-2017 1044    Functional Assessment Tool Used (Outpatient only)  skilled clinical observation, clinical judgement    Functional Limitation  Self care    Self Care Current Status (Z1460)  At least 1 percent but less than 20 percent impaired, limited or restricted    Self Care Goal Status (Q7998)  0 percent impaired, limited or restricted    Self Care Discharge Status [phone])  At least 1 percent but less than 20 percent impaired, limited or restricted       Problem List Patient Active Problem List   Diagnosis Date Noted  . Stenosis of right carotid artery 02/01/2017  . Acute embolic stroke ([place]) 72/76/1848  . Bradycardia 11/10/2015  . Essential tremor 06/03/2015  . Dizziness 08/21/2012  . Syncope 04/10/2011  . CALF PAIN, LEFT 10/11/2010  . DYSPEPSIA 07/31/2010  . CHEST DISCOMFORT 07/31/2010  . Hyperlipidemia 05/16/2010  . HYPERTENSION, BENIGN 05/16/2010  . CAD, NATIVE VESSEL 05/16/2010    Harrel Carina, MS ,OTR/L 10/31/2017, 10:49 AM  [place] MAIN [facility] SERVICES [address] [place], [place], [address] Phone: [phone]   Fax:  [phone]  Name: Gevin Perea [facility]. MRN: [id number] Date of Birth: Feb 14, 1933

## 2017-10-11 ENCOUNTER — Ambulatory Visit (INDEPENDENT_AMBULATORY_CARE_PROVIDER_SITE_OTHER): Payer: Medicare Other | Admitting: *Deleted

## 2017-10-11 DIAGNOSIS — I639 Cerebral infarction, unspecified: Secondary | ICD-10-CM

## 2017-10-14 ENCOUNTER — Encounter: Payer: Medicare Other | Admitting: Speech Pathology

## 2017-10-14 ENCOUNTER — Ambulatory Visit: Payer: Medicare Other | Admitting: Occupational Therapy

## 2017-10-14 NOTE — Progress Notes (Signed)
[facility] Summary Report / Loop Recorder 

## 2017-10-16 ENCOUNTER — Encounter: Payer: Medicare Other | Admitting: Speech Pathology

## 2017-10-16 ENCOUNTER — Ambulatory Visit: Payer: Medicare Other | Admitting: Occupational Therapy

## 2017-10-17 LAB — CUP PACEART REMOTE DEVICE CHECK
Date Time Interrogation Session: 20181110004122
Implantable Pulse Generator Implant Date: 20180612

## 2017-10-21 ENCOUNTER — Encounter: Payer: Medicare Other | Admitting: Speech Pathology

## 2017-10-21 ENCOUNTER — Ambulatory Visit: Payer: Medicare Other | Admitting: Occupational Therapy

## 2017-10-28 ENCOUNTER — Encounter: Payer: Medicare Other | Admitting: Occupational Therapy

## 2017-10-28 ENCOUNTER — Encounter: Payer: Medicare Other | Admitting: Speech Pathology

## 2017-10-30 ENCOUNTER — Encounter: Payer: Medicare Other | Admitting: Occupational Therapy

## 2017-10-30 ENCOUNTER — Encounter: Payer: Medicare Other | Admitting: Speech Pathology

## 2017-11-11 ENCOUNTER — Ambulatory Visit (INDEPENDENT_AMBULATORY_CARE_PROVIDER_SITE_OTHER): Payer: Medicare Other | Admitting: *Deleted

## 2017-11-11 DIAGNOSIS — I639 Cerebral infarction, unspecified: Secondary | ICD-10-CM

## 2017-11-11 NOTE — Progress Notes (Signed)
[facility] Summary Report / Loop Recorder 

## 2017-11-18 LAB — CUP PACEART REMOTE DEVICE CHECK
Date Time Interrogation Session: 20181210010855
Implantable Pulse Generator Implant Date: 20180612

## 2017-11-28 ENCOUNTER — Telehealth: Payer: Self-pay | Admitting: Cardiology

## 2017-11-28 NOTE — Telephone Encounter (Signed)
Spoke w/ pt and requested that he send a manual transmission b/c his home monitor has not updated in at least 14 days.   

## 2017-12-06 ENCOUNTER — Encounter: Payer: Self-pay | Admitting: Cardiology

## 2017-12-09 ENCOUNTER — Telehealth: Payer: Self-pay | Admitting: Cardiovascular Disease

## 2017-12-09 NOTE — Progress Notes (Signed)
Cardiology Office Note  Date:  12/10/2017   ID:  [name]., DOB 1933-08-10, MRN [id number]  PCP:  [name], MD   Chief Complaint  Patient presents with  . Other    Patient c/o wheezing for about a month. Patient denies chest pain adn SOB. Meds reviewed verbally with patient.     HPI:  [name] is a very pleasant 84 year-old gentleman with a history of  coronary artery disease,   stent placed to his proximal RCA in September 2007, taxis stent 3.0 x 24 mm, with stress test 08/2010 and May 2012,  patient of Dr. [name],  previous syncopal episode (Etiology of his spell is uncertain),  50% carotid disease on the right CVA who presents for routine followup of his coronary artery disease,  Tremor, and stroke  Recent trip to [place] then went to [place] Developed coughing wheezing and [place] Went to acute care given, steroid shot with antibiotics Symptoms slowly getting better, still with residual symptoms some wheezing when he lays down at night   weights have been stable , no leg edema or abdominal bloating denies any chest pain  not been short of breath with exertion, still exercising in the daytime   some diet fluctuations during the holiday Some nasal congestion  Review of his loop monitor shows no significant arrhythmia He denies any tachycardia or palpitations Currently on aspirin  Reports that he feels 75% of his baseline Still with peripheral vision issues  No neurological changes, still with mild tremor Followed by neurology  EKG personally reviewed by myself on todays visit Shows normal sinus rhythm with rate 59 bpm unable to exclude old inferior MI  Other past medical history reviewed February 2018 he had acute vision changes, word finding difficulty. He was taken by life flight to [facility] hospital MRI/MRA showing acute stroke Details of scan as below: 1. Acute left parieto-occipital intraparenchymal hematoma with adjacent subarachnoid  hemorrhage. This most likely represents hemorrhagic transformation from an embolic infarct given its configuration. No underlying mass. No other findings to suggest amyloid angiopathy. 2. Normal MRA of the head.  He had a CT scan of the neck showing 50% stenosis on the left, 10% on the right  At the time of his stroke he was on aspirin and Plavix. Plavix was held  echocardiogram  NORMAL LEFT VENTRICULAR SYSTOLIC FUNCTION WITH MILD LVH NORMAL RIGHT VENTRICULAR SYSTOLIC FUNCTION VALVULAR REGURGITATION: TRIVIAL AR, TRIVIAL TR VALVULAR STENOSIS: TRIVIAL AS VERY POOR SOUND TRANSMISSION-DEFINITY CONTRAST USED NO PRIOR STUDY FOR COMPARISON   Prior to his stroke he was started on Sinemet for tremor Wife reports this did not help very much  He had a nuclear Myoview earlier in 2014 that showed no ischemia. He has chronic dizziness when he changes position. This has been a chronic issue and he has learned to "live with it". No significant progression from previous visits.  stress test on Apr 20 2011 which showed no significant ischemia, normal ejection fraction estimated at 69%. There was a very small region of decreased perfusion suggestive of small region of old scar or attenuation artifact in the apical wall. Overall this was a low risk scan. He was able to perform on the treadmill well with a maximum heart rate of 128 beats per minute, exercised for 8-1/2 minutes, peak blood pressure 194/81.    PMH:   has a past medical history of BPH (benign prostatic hyperplasia), Coronary artery disease, and Hypertension.  PSH:    Past  Surgical History:  Procedure Laterality Date  . Intestinal Blockage    . LOOP RECORDER INSERTION N/A 05/14/2017   Procedure: Loop Recorder Insertion;  Surgeon: [name], Steven C, MD;  [facility];  Service: Cardiovascular;  Laterality: N/A;  . SKIN CANCER EXCISION     x2  . TONSILLECTOMY      Current Outpatient Medications  Medication Sig Dispense  Refill  . albuterol (PROVENTIL HFA;VENTOLIN HFA) 108 (90 Base) MCG/ACT inhaler Inhale 2 puffs into the lungs every 6 (six) hours as needed for wheezing or shortness of breath. 1 Inhaler 2  . aspirin 81 MG tablet Take 81 mg by mouth daily.      Marland Kitchen. b complex vitamins tablet Take 1 tablet by mouth daily.    . cholecalciferol (VITAMIN D) 1000 units tablet Take 2,000 Units by mouth daily.    Marland Kitchen. ezetimibe (ZETIA) 10 MG tablet Take 1 tablet (10 mg total) by mouth daily. 90 tablet 3  . FLAXSEED, LINSEED, PO Take 2 g by mouth daily.    Marland Kitchen. lisinopril (PRINIVIL,ZESTRIL) 20 MG tablet Take 10 mg by mouth daily.    . nitroGLYCERIN (NITROSTAT) 0.4 MG SL tablet Place 1 tablet (0.4 mg total) under the tongue every 5 (five) minutes as needed for chest pain. 25 tablet 3  . pregabalin (LYRICA) 150 MG capsule Take 150 mg by mouth daily.     . simvastatin (ZOCOR) 40 MG tablet Take 40 mg by mouth every evening.     . Tamsulosin HCl (FLOMAX) 0.4 MG CAPS Take 0.8 mg by mouth daily.      No current facility-administered medications for this visit.      Allergies:   Codeine and Penicillins   Social History:  The patient  reports that  has never smoked. he has never used smokeless tobacco. He reports that he drinks about 3.6 - 4.8 oz of alcohol per week. He reports that he does not use drugs.   Family History:   family history includes Cancer in his other; Hyperlipidemia in his other; Hypertension in his mother and other; Other in his other.    Review of Systems: Review of Systems  Constitutional: Negative.   Eyes:       Vision deficits  Respiratory: Negative.   Cardiovascular: Negative.   Gastrointestinal: Negative.   Musculoskeletal: Negative.        Gait instability  Neurological: Negative.        Word finding difficulty, peripheral vision deficit  Psychiatric/Behavioral: Negative.   All other systems reviewed and are negative.    PHYSICAL EXAM: VS:  BP 130/62 (BP Location: Left Arm, Patient Position:  Sitting, Cuff Size: Normal)   Pulse (!) 59   Ht 5' 8.5" (1.74 m)   Wt 190 lb 4 oz (86.3 kg)   BMI 28.51 kg/m  , BMI Body mass index is 28.51 kg/m.  GEN: Well nourished, well developed, in no acute distress  HEENT: normal  Neck: no JVD, carotid bruits, or masses Cardiac: RRR; no murmurs, rubs, or gallops,no edema  Respiratory: Scattered wheezing bilaterally, normal work of breathing GI: soft, nontender, nondistended, + BS MS: no deformity or atrophy  Skin: warm and dry, no rash Neuro:  Strength and sensation are intact. Full neuro exam not performed Psych: euthymic mood, full affect    Recent Labs: 01/05/2017: ALT 26; BUN 20; Creatinine, Ser 1.11; Hemoglobin 15.5; Platelets 160; Potassium 4.1; Sodium 137    Lipid Panel Lab Results  Component Value Date  CHOL 122 01/23/2010   HDL 33.8 01/23/2010   LDLCALC 69.6 01/23/2010   TRIG 93 01/23/2010    total chol  Wt Readings from Last 3 Encounters:  12/10/17 190 lb 4 oz (86.3 kg)  07/31/17 186 lb 15.2 oz (84.8 kg)  05/09/17 190 lb 4 oz (86.3 kg)       ASSESSMENT AND PLAN:   Hyperlipidemia, unspecified hyperlipidemia type - Plan: EKG 12-Lead Cholesterol is at goal on the current lipid regimen. No changes to the medications were made  SOB/wheezing Likely tail end of viral syndrome previously treated with antibiotics for possible bronchitis as well as steroid shot at acute care in [place] He is concerned about residual wheezing.  Likely improving but will give him Proventil inhaler to take as needed for wheezing that keeps him awake at night  HYPERTENSION, BENIGN - Plan: EKG 12-Lead Blood pressure is well controlled on today's visit. No changes made to the medications.  Atherosclerosis of native coronary artery of native heart without angina pectoris - Plan: EKG 12-Lead Currently with no symptoms of angina. No further workup at this time.   Syncope, unspecified syncope type - Plan: EKG 12-Lead No recent episodes of  syncope or near syncope  Acute embolic stroke (HCC) Loop monitor in place,no arrhythmia in the past 6 months No atrial fibrillation  Bradycardia  asymptomatic Not on any rate controlling agents  Stenosis of right carotid artery 50% disease on the right  recommended aggressive cholesterol management  Essential tremor Followed by neurology   Total encounter time more than 25 minutes  Greater than 50% was spent in counseling and coordination of care with the patient   Disposition:   F/U  12 months   Orders Placed This Encounter  Procedures  . EKG 12-Lead     Signed, [name] Arbour, M.D., Ph.D. 12/10/2017  [facility] [place], [place] [phone]

## 2017-12-09 NOTE — Telephone Encounter (Signed)
Spoke with patients wife per release form and she states that they have been out of town for the last 3 weeks traveling. She reports that he has been having some wheezing at night when he lays down at night and states that it is mostly when he exhales. She states that they are concerned this may be a sign of heart problems. She states that his weights have been stable and that he denies any chest pain and has not been short of breath. Reviewed that this could be related to travel and increased salt or fluid intake. She states that he has certainly had some diet fluctuations with it being the holidays. She voices concern and would really like to get him in to be seen if possible. Offered appointment for tomorrow at 08:00 with Dr. Mariah MillingGollan. She was agreeable with this date and time with no further questions or concerns at this time.

## 2017-12-09 NOTE — Telephone Encounter (Signed)
Patient started wheezing 2-3 wks ago mainly at night when laying down .  Patient has never done this before .  Wife is concerned another provider commented that this may be a sign of a heart problem

## 2017-12-10 ENCOUNTER — Ambulatory Visit (INDEPENDENT_AMBULATORY_CARE_PROVIDER_SITE_OTHER): Payer: Medicare Other | Admitting: Cardiovascular Disease

## 2017-12-10 ENCOUNTER — Ambulatory Visit (INDEPENDENT_AMBULATORY_CARE_PROVIDER_SITE_OTHER): Payer: Medicare Other | Admitting: *Deleted

## 2017-12-10 ENCOUNTER — Encounter: Payer: Self-pay | Admitting: Cardiovascular Disease

## 2017-12-10 VITALS — BP 130/62 | HR 59 | Ht 68.5 in | Wt 190.2 lb

## 2017-12-10 DIAGNOSIS — I25118 Atherosclerotic heart disease of native coronary artery with other forms of angina pectoris: Secondary | ICD-10-CM

## 2017-12-10 DIAGNOSIS — I639 Cerebral infarction, unspecified: Secondary | ICD-10-CM

## 2017-12-10 DIAGNOSIS — I10 Essential (primary) hypertension: Secondary | ICD-10-CM | POA: Diagnosis not present

## 2017-12-10 DIAGNOSIS — E782 Mixed hyperlipidemia: Secondary | ICD-10-CM

## 2017-12-10 DIAGNOSIS — I209 Angina pectoris, unspecified: Secondary | ICD-10-CM

## 2017-12-10 DIAGNOSIS — R55 Syncope and collapse: Secondary | ICD-10-CM | POA: Diagnosis not present

## 2017-12-10 DIAGNOSIS — I6521 Occlusion and stenosis of right carotid artery: Secondary | ICD-10-CM | POA: Diagnosis not present

## 2017-12-10 MED ORDER — ALBUTEROL SULFATE HFA 108 (90 BASE) MCG/ACT IN AERS: 2.0000 | INHALATION_SPRAY | Freq: Four times a day (QID) | RESPIRATORY_TRACT | 2 refills | Status: DC | PRN

## 2017-12-10 NOTE — Patient Instructions (Addendum)
Medication Instructions:   Consider using an inhaler for wheezing at night as needed  Labwork:  No new labs needed  Testing/Procedures:  No further testing at this time   Follow-Up: It was a pleasure seeing you in the office today. Please call us if you have new issues that need to be addressed before your next appt.  [phone]  Your physician wants you to follow-up in: 12 months.  You will receive a reminder letter in the mail two months in advance. If you don't receive a letter, please call our office to schedule the follow-up appointment.  If you need a refill on your cardiac medications before your next appointment, please call your pharmacy.

## 2017-12-11 NOTE — Progress Notes (Signed)
[facility] Summary Report / Loop Recorder 

## 2017-12-23 LAB — CUP PACEART REMOTE DEVICE CHECK
Date Time Interrogation Session: 20190109011036
Implantable Pulse Generator Implant Date: 20180612

## 2018-01-08 ENCOUNTER — Other Ambulatory Visit: Payer: Self-pay | Admitting: Cardiovascular Disease

## 2018-01-09 ENCOUNTER — Ambulatory Visit (INDEPENDENT_AMBULATORY_CARE_PROVIDER_SITE_OTHER): Payer: Medicare Other | Admitting: *Deleted

## 2018-01-09 DIAGNOSIS — I639 Cerebral infarction, unspecified: Secondary | ICD-10-CM

## 2018-01-13 NOTE — Progress Notes (Signed)
[facility] Summary Report / Loop Recorder 

## 2018-02-04 LAB — CUP PACEART REMOTE DEVICE CHECK
Date Time Interrogation Session: 20190208011100
Implantable Pulse Generator Implant Date: 20180612

## 2018-02-11 ENCOUNTER — Ambulatory Visit (INDEPENDENT_AMBULATORY_CARE_PROVIDER_SITE_OTHER): Payer: Medicare Other | Admitting: *Deleted

## 2018-02-11 DIAGNOSIS — I639 Cerebral infarction, unspecified: Secondary | ICD-10-CM

## 2018-02-12 NOTE — Progress Notes (Signed)
[facility] Summary Report / Loop Recorder 

## 2018-03-17 ENCOUNTER — Ambulatory Visit (INDEPENDENT_AMBULATORY_CARE_PROVIDER_SITE_OTHER): Payer: Medicare Other | Admitting: *Deleted

## 2018-03-17 DIAGNOSIS — I639 Cerebral infarction, unspecified: Secondary | ICD-10-CM

## 2018-03-17 NOTE — Progress Notes (Signed)
[facility] Summary Report / Loop Recorder 

## 2018-03-18 IMAGING — CT CT CERVICAL SPINE W/O CM
4 of 7 series · 15 of 33 positions shown, 16 images · non-contrast
Comparison: Brain MRI 07/03/2014 and earlier.

ADDENDUM:
Critical Value/emergent results were called by telephone at the time
of interpretation on 01/05/2017 at 1151 hours to Dr. HODAN MARMOLEJOS ,
who verbally acknowledged these results.
CLINICAL DATA: 83-year-old male with stroke-like symptoms, headache
last night and confused this morning with partially aphasia. Initial
encounter.

EXAM:
CT HEAD WITHOUT CONTRAST
CT CERVICAL SPINE WITHOUT CONTRAST
TECHNIQUE: Multidetector CT imaging of the head and cervical spine was
performed following the standard protocol without intravenous
contrast. Multiplanar CT image reconstructions of the cervical spine
were also generated.

[Series 7: c spine soft · axial · 0.37mm/px · z∈[-282,-224]mm · 3 of 74 slices shown]
[im 15/74  soft-tissue]
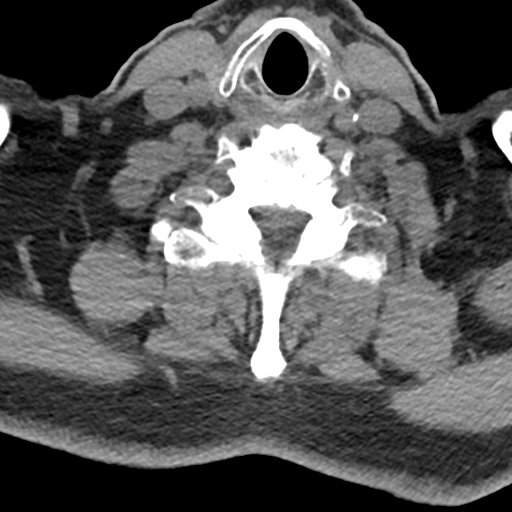
[im 30/74  soft-tissue]
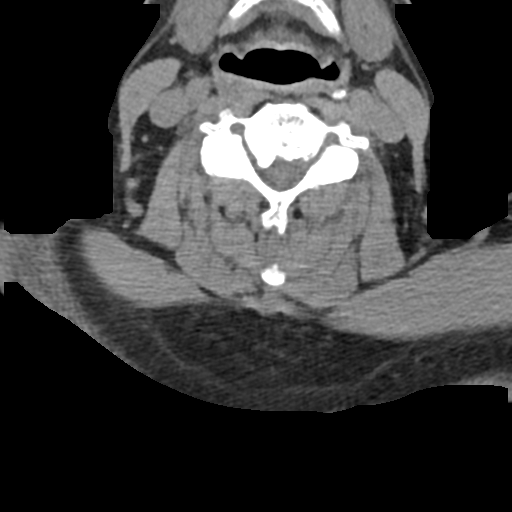
[im 44/74  soft-tissue]
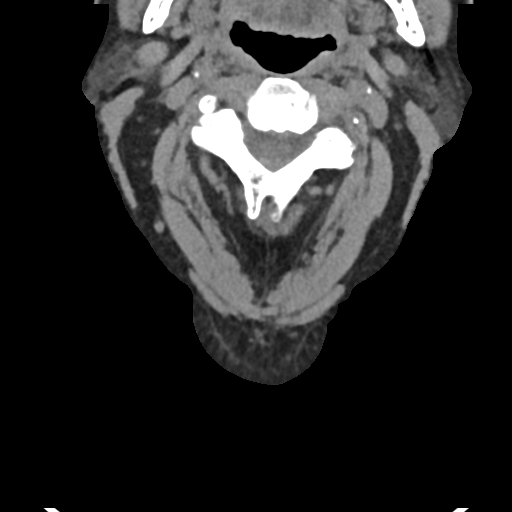

[Series 10: sagittal bone · sagittal · 0.31mm/px · 5 of 66 slices shown]
[im 11/66  bone]
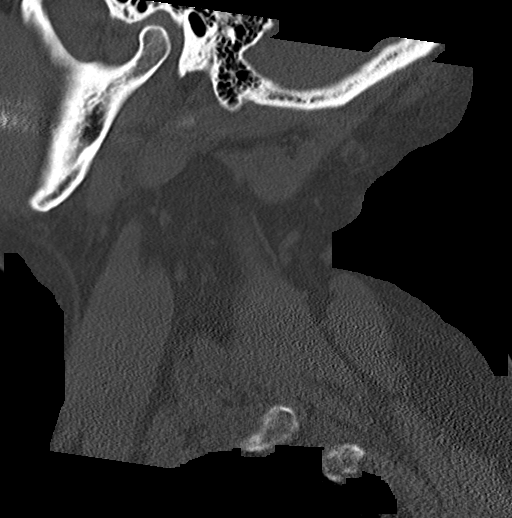
[im 22/66  bone]
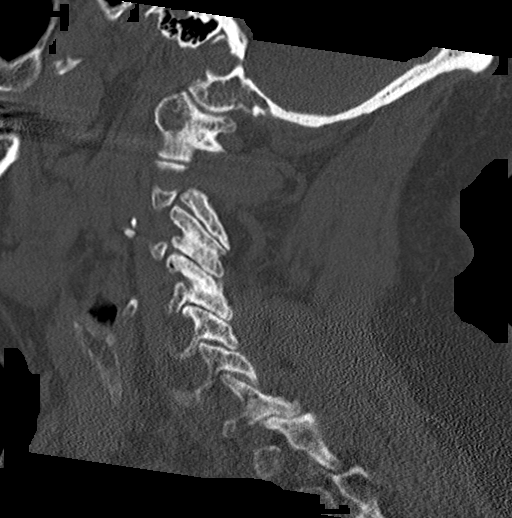
[im 33/66  bone]
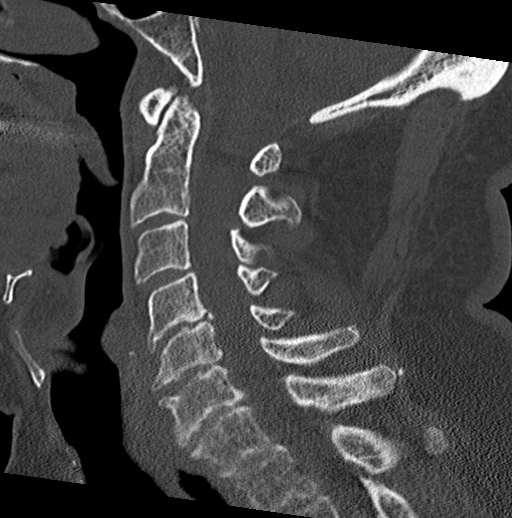
[im 44/66  bone]
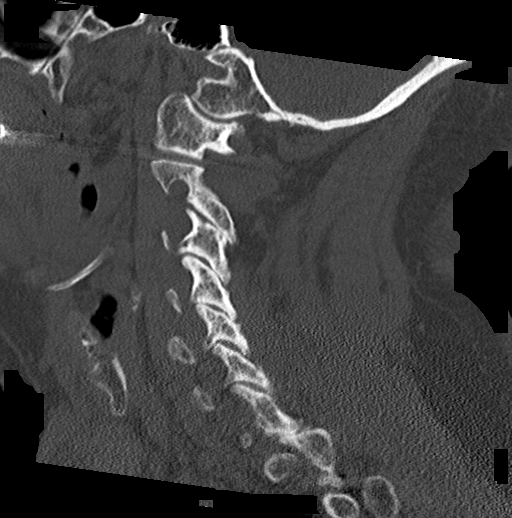
[im 55/66  bone]
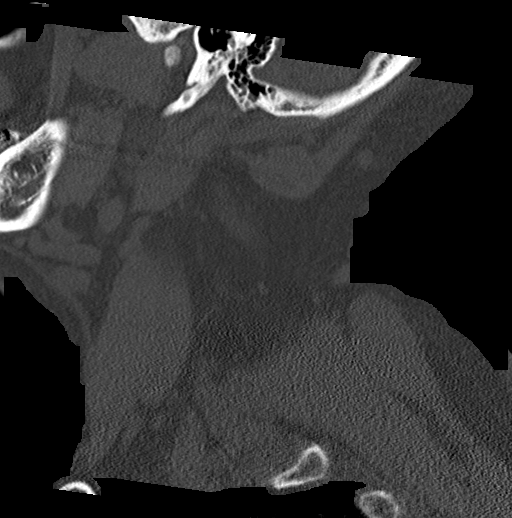

[Series 11: coronal bone · coronal · 0.28mm/px · 3 of 78 slices shown]
[im 20/78  bone]
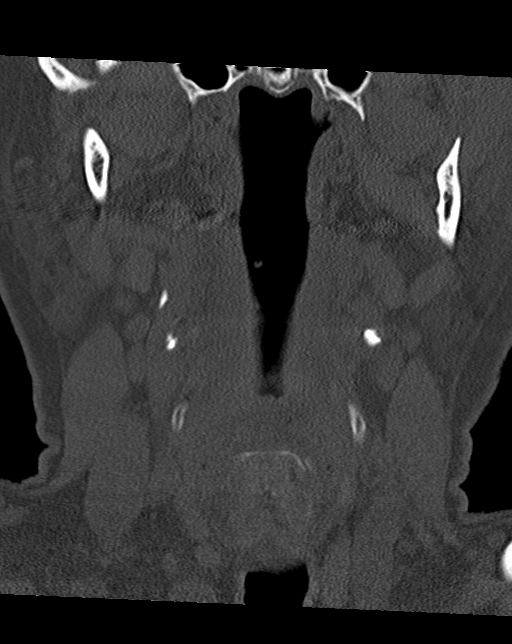
[im 39/78  bone]
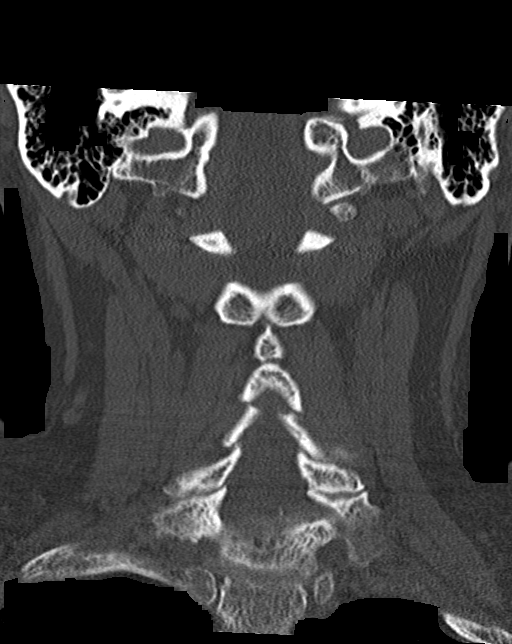
[im 58/78  bone]
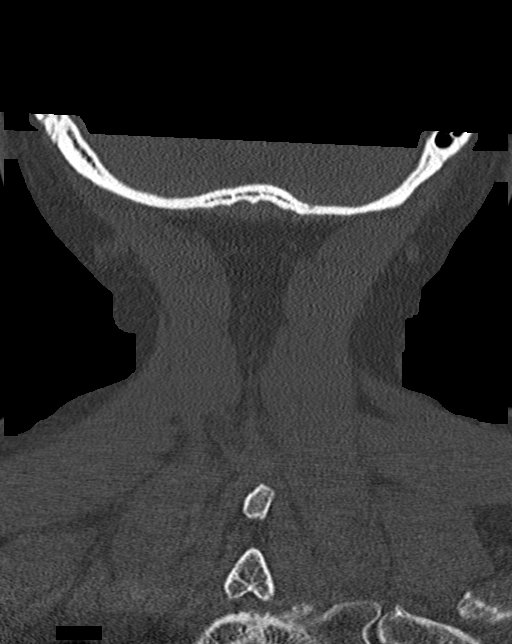

[Series 12: orthogonal bone · axial · 0.23mm/px · z∈[-304,-215]mm · 4 of 79 slices shown, 5 images]
[im 16/79  soft-tissue]
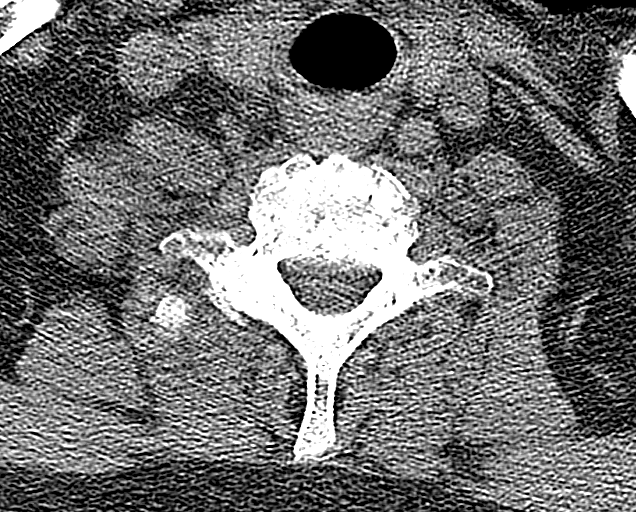
[im 16/79  bone]
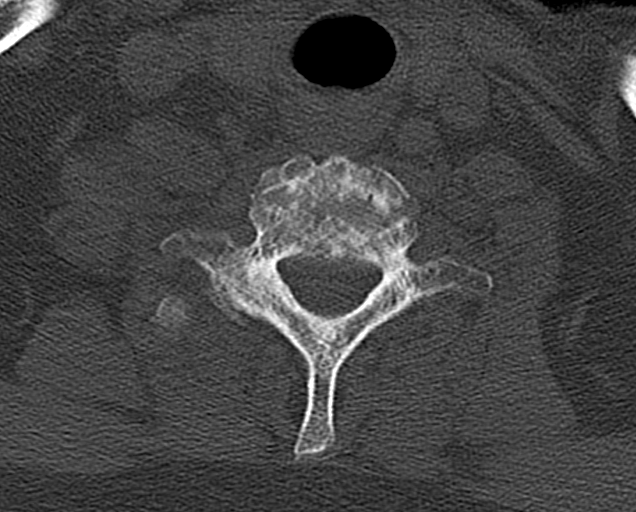
[im 32/79  bone]
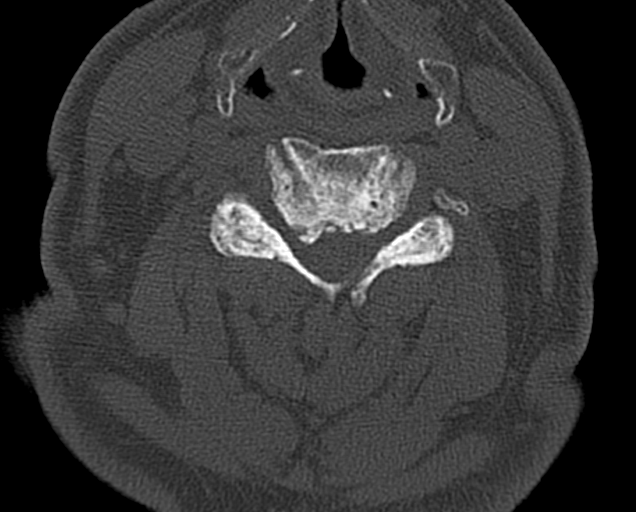
[im 47/79  bone]
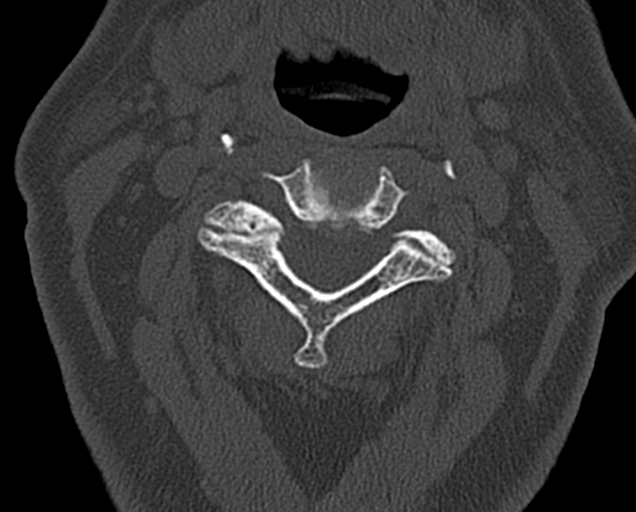
[im 63/79  bone]
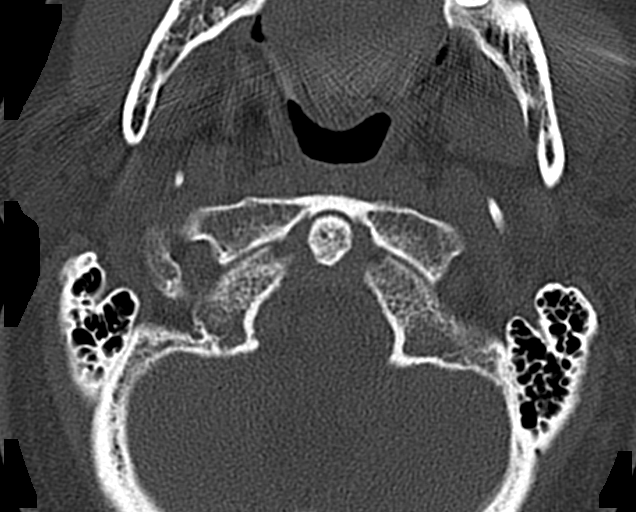

[15 of 33 positions shown; findings below may reference images not displayed]

FINDINGS: CT HEAD FINDINGS

Brain: Round hyperdense intra-axial hemorrhage centered at the
confluence of the posterior left temporal, left parietal and
occipital lobes measures 34 by 34 x 45 mm (AP by transverse by CC)
for an estimated blood volume of 26 mL. This area was negative on
the prior MRI. There is subarachnoid extension of blood at the left
occipital lobe best seen on sagittal image 44. Mass effect on the
left occipital horn and atrium of the left lateral ventricle but no
intraventricular extension. Surrounding edema. Mild regional mass
effect. No midline shift.

No other extra-axial extension. Stable cerebral volume. Stable
ventricular prominence. Basilar cisterns are patent. Elsewhere
gray-white matter differentiation is within normal limits for age.

Vascular: Calcified atherosclerosis at the skull base.

Skull: Bulky dural calcifications. Hyperostosis frontalis. No acute
osseous abnormality identified.

Sinuses/Orbits: Visualized paranasal sinuses and mastoids are stable
and well pneumatized.

Other: No acute orbit or scalp soft tissue finding.

CT CERVICAL SPINE FINDINGS

Alignment: Relatively preserved cervical lordosis. Mild
anterolisthesis of C7 on T1 appears to be degenerative in nature.
Bilateral posterior element alignment is within normal limits.

Skull base and vertebrae: Visualized skull base is intact. No
atlanto-occipital dissociation. No cervical vertebral fracture.

Soft tissues and spinal canal: No prevertebral fluid or swelling. No
visible canal hematoma.

Calcified carotid atherosclerosis, otherwise negative noncontrast
neck soft tissues.

Disc levels: Moderate to severe lower cervical disc and endplate
degeneration. Widespread cervical facet hypertrophy. Mild
degenerative lower cervical spinal stenosis at C5-C6 and C6-C7.

Upper chest: Visible upper thoracic levels appear intact. Negative
lung apices.

Other:
IMPRESSION: 1. Acute intracranial hemorrhage. Posterior left hemisphere
intra-axial hemorrhage with estimated blood volume of 26 mL. Favor
small vessel disease related hemorrhage, but query coagulopathy.
2. Associated small volume of subarachnoid hemorrhage about the left
occipital lobe, but no intraventricular extension.
3. Surrounding edema with mild regional mass effect. No midline
shift, no loss of basilar cisterns, no ventriculomegaly.
4.  No acute fracture or listhesis identified in the cervical spine.
5. Cervical spine degeneration with mild lower cervical spinal
stenosis.

## 2018-03-24 LAB — CUP PACEART REMOTE DEVICE CHECK
Date Time Interrogation Session: 20190313013959
Implantable Pulse Generator Implant Date: 20180612

## 2018-04-01 ENCOUNTER — Other Ambulatory Visit: Payer: Self-pay | Admitting: Cardiovascular Disease

## 2018-04-16 LAB — CUP PACEART REMOTE DEVICE CHECK
Date Time Interrogation Session: 20190415023947
Implantable Pulse Generator Implant Date: 20180612

## 2018-04-18 ENCOUNTER — Ambulatory Visit (INDEPENDENT_AMBULATORY_CARE_PROVIDER_SITE_OTHER): Payer: Medicare Other | Admitting: *Deleted

## 2018-04-18 DIAGNOSIS — I639 Cerebral infarction, unspecified: Secondary | ICD-10-CM | POA: Diagnosis not present

## 2018-04-21 NOTE — Progress Notes (Signed)
[facility] Summary Report / Loop Recorder 

## 2018-05-02 ENCOUNTER — Ambulatory Visit: Payer: Self-pay

## 2018-05-09 LAB — CUP PACEART REMOTE DEVICE CHECK
Date Time Interrogation Session: 20190518023521
Implantable Pulse Generator Implant Date: 20180612

## 2018-05-21 ENCOUNTER — Ambulatory Visit (INDEPENDENT_AMBULATORY_CARE_PROVIDER_SITE_OTHER): Payer: Medicare Other | Admitting: *Deleted

## 2018-05-21 DIAGNOSIS — I639 Cerebral infarction, unspecified: Secondary | ICD-10-CM

## 2018-05-22 NOTE — Progress Notes (Signed)
[facility] Summary Report / Loop Recorder 

## 2018-05-29 ENCOUNTER — Encounter: Payer: Self-pay | Admitting: Urology

## 2018-05-29 ENCOUNTER — Ambulatory Visit (INDEPENDENT_AMBULATORY_CARE_PROVIDER_SITE_OTHER): Payer: Medicare Other | Admitting: Urology

## 2018-05-29 VITALS — BP 131/75 | HR 58 | Ht 70.0 in | Wt 182.0 lb

## 2018-05-29 DIAGNOSIS — N400 Enlarged prostate without lower urinary tract symptoms: Secondary | ICD-10-CM

## 2018-05-29 DIAGNOSIS — R300 Dysuria: Secondary | ICD-10-CM | POA: Diagnosis not present

## 2018-05-29 DIAGNOSIS — I6521 Occlusion and stenosis of right carotid artery: Secondary | ICD-10-CM | POA: Diagnosis not present

## 2018-05-29 DIAGNOSIS — N401 Enlarged prostate with lower urinary tract symptoms: Secondary | ICD-10-CM | POA: Diagnosis not present

## 2018-05-29 DIAGNOSIS — R351 Nocturia: Secondary | ICD-10-CM

## 2018-05-29 DIAGNOSIS — R0683 Snoring: Secondary | ICD-10-CM

## 2018-05-29 LAB — BLADDER SCAN AMB NON-IMAGING

## 2018-05-29 NOTE — Progress Notes (Signed)
05/29/2018 4:53 PM   [name] [name] 1933/01/22 [id number]  Referring provider: [name], MD [address] [facility] [place], [place] [address]  Chief Complaint  Patient presents with  . Benign Prostatic Hypertrophy    New Patient    HPI: Pleasant 84 year old male who presents today to establish care at [facility].  He is previously followed by Dr. Retta Diones at [facility] u[facility] but is seeking a urologist closer to his home.  He was followed on an annual basis.  Records request has been sent today.  His primary concern today is nocturia.  He gets up about 4 times at night.  He does snore.  He is never had a sleep study.  He has no lower extremity edema.  In the daytime, he has essentially minimal urinary symptoms.  He is currently on Flomax.  He also complains today that he has dysuria at nighttime only when he drinks alcoholic beverages.  He enjoys about 2 beverages a week, either martini or 2 glasses of wine.  On occasions when he has this, he has burning afterwards.  If he drinks water with his alcohol, eats better.  He has dysuria on no other occasions.  He did have a stroke last year.  He has some memory issues following the stroke.  His urinary symptoms were present prior to the stroke and are unchanged after.  PVR 94 cc.  IPSS    Row Name 05/29/18 1000         International Prostate Symptom Score   How often have you had the sensation of not emptying your bladder?  About half the time     How often have you had to urinate less than every two hours?  Less than half the time     How often have you found you stopped and started again several times when you urinated?  More than half the time     How often have you found it difficult to postpone urination?  Almost always     How often have you had a weak urinary stream?  About half the time     How often have you had to strain to start urination?  Not at All     How  many times did you typically get up at night to urinate?  4 Times     Total IPSS Score  21       Quality of Life due to urinary symptoms   If you were to spend the rest of your life with your urinary condition just the way it is now how would you feel about that?  Mixed        Score:  1-7 Mild 8-19 Moderate 20-35 Severe   PMH: Past Medical History:  Diagnosis Date  . Acute embolic stroke (HCC) 02/01/2017  . BPH (benign prostatic hyperplasia)   . Bradycardia 11/10/2015  . Coronary artery disease   . Dizziness 08/21/2012  . DYSPEPSIA 07/31/2010   Qualifier: Diagnosis of  By: Dalphine Handing    . Hyperlipidemia 05/16/2010   Qualifier: Diagnosis of  By: Mariah Milling MD, [name]     . Hypertension   . HYPERTENSION, BENIGN 05/16/2010   Qualifier: Diagnosis of  By: Mariah Milling MD, [name]    . Stenosis of right carotid artery 02/01/2017    Surgical History: Past Surgical History:  Procedure Laterality Date  . Intestinal Blockage    . LOOP RECORDER INSERTION N/A 05/14/2017   Procedure: Loop  Recorder Insertion;  Surgeon: [name], Steven C, MD;  Location[facility] INVASIVE CV LAB;  Service: Cardiovascular;  Laterality: N/A;  . SKIN CANCER EXCISION     x2  . TONSILLECTOMY      Home Medications:  Allergies as of 05/29/2018      Reactions   Codeine Nausea Only   Other reaction(s): Vomiting   Penicillins    Rash      Medication List        Accurate as of 05/29/18  4:53 PM. Always use your most recent med list.          albuterol 108 (90 Base) MCG/ACT inhaler Commonly known as:  PROVENTIL HFA;VENTOLIN HFA Inhale 2 puffs into the lungs every 6 (six) hours as needed for wheezing or shortness of breath.   aspirin 81 MG tablet Take 81 mg by mouth daily.   b complex vitamins tablet Take 1 tablet by mouth daily.   cholecalciferol 1000 units tablet Commonly known as:  VITAMIN D Take 2,000 Units by mouth daily.   ezetimibe 10 MG tablet Commonly known as:  ZETIA Take 1 tablet (10 mg total) by  mouth daily.   FLAXSEED (LINSEED) PO Take 2 g by mouth daily.   lisinopril 20 MG tablet Commonly known as:  PRINIVIL,ZESTRIL Take 10 mg by mouth daily.   nitroGLYCERIN 0.4 MG SL tablet Commonly known as:  NITROSTAT Place 1 tablet (0.4 mg total) under the tongue every 5 (five) minutes as needed for chest pain.   pregabalin 150 MG capsule Commonly known as:  LYRICA Take 150 mg by mouth daily.   simvastatin 40 MG tablet Commonly known as:  ZOCOR Take 40 mg by mouth every evening.   tamsulosin 0.4 MG Caps capsule Commonly known as:  FLOMAX Take 0.8 mg by mouth daily.       Allergies:  Allergies  Allergen Reactions  . Codeine Nausea Only    Other reaction(s): Vomiting  . Penicillins     Rash     Family History: Family History  Problem Relation Age of Onset  . Hypertension Mother   . Cancer Other   . Hyperlipidemia Other   . Hypertension Other   . Other Other        CHF    Social History:  reports that he has never smoked. He has never used smokeless tobacco. He reports that he drinks about 3.6 - 4.8 oz of alcohol per week. He reports that he does not use drugs.  ROS: UROLOGY Frequent Urination?: Yes Hard to postpone urination?: Yes Burning/pain with urination?: Yes Get up at night to urinate?: Yes Leakage of urine?: No Urine stream starts and stops?: Yes Trouble starting stream?: Yes Do you have to strain to urinate?: Yes Blood in urine?: No Urinary tract infection?: No Sexually transmitted disease?: No Injury to kidneys or bladder?: No Painful intercourse?: No Weak stream?: No Erection problems?: Yes Penile pain?: No  Gastrointestinal Nausea?: No Vomiting?: No Indigestion/heartburn?: No Diarrhea?: No Constipation?: No  Constitutional Fever: No Night sweats?: No Weight loss?: No Fatigue?: No  Skin Skin rash/lesions?: No Itching?: Yes  Eyes Blurred vision?: No Double vision?: No  Ears/Nose/Throat Sore throat?: No Sinus problems?:  No  Hematologic/Lymphatic Swollen glands?: No Easy bruising?: Yes  Cardiovascular Leg swelling?: No Chest pain?: No  Respiratory Cough?: No Shortness of breath?: No  Endocrine Excessive thirst?: No  Musculoskeletal Back pain?: Yes Joint pain?: No  Neurological Headaches?: No Dizziness?: No  Psychologic Depression?: No Anxiety?: No  Physical  Exam: BP 131/75   Pulse (!) 58   Ht 5\' 10"  (1.778 m)   Wt 182 lb (82.6 kg)   BMI 26.11 kg/m   Constitutional:  Alert and oriented, No acute distress.  Accompanied by wife today. HEENT: [place] AT, moist mucus membranes.  Trachea midline, no masses. Cardiovascular: No clubbing, cyanosis, or edema. Respiratory: Normal respiratory effort, no increased work of breathing. GI: Abdomen is soft, nontender, nondistended, no abdominal masses GU: No CVA tenderness. Rectal exam was deferred today given patient's age. Skin: No rashes, bruises or suspicious lesions. Neurologic: Grossly intact, no focal deficits, moving all 4 extremities. Psychiatric: Normal mood and affect.  Laboratory Data: Lab Results  Component Value Date   WBC 10.5 01/05/2017   HGB 15.5 01/05/2017   HCT 45.3 01/05/2017   MCV 95.2 01/05/2017   PLT 160 01/05/2017    Lab Results  Component Value Date   CREATININE 1.11 01/05/2017      Lab Results  Component Value Date   HGBA1C 5.4 01/23/2010    Urinalysis Results for orders placed or performed in visit on 05/29/18  Microscopic Examination  Result Value Ref Range   WBC, UA 0-5 0 - 5 /hpf   RBC, UA 0-2 0 - 2 /hpf   Epithelial Cells (non renal) 0-10 0 - 10 /hpf   Bacteria, UA Few (A) None seen/Few  Urinalysis, Complete  Result Value Ref Range   Specific Gravity, UA 1.015 1.005 - 1.030   pH, UA 6.0 5.0 - 7.5   Color, UA Yellow Yellow   Appearance Ur Clear Clear   Leukocytes, UA Negative Negative   Protein, UA Negative Negative/Trace   Glucose, UA Negative Negative   Ketones, UA Negative Negative    RBC, UA Negative Negative   Bilirubin, UA Negative Negative   Urobilinogen, Ur 0.2 0.2 - 1.0 mg/dL   Nitrite, UA Negative Negative   Microscopic Examination See below:   BLADDER SCAN AMB NON-IMAGING  Result Value Ref Range   Scan Result 94ml     Pertinent Imaging: PVR as above  Assessment & Plan:    1. Benign prostatic hyperplasia with nocturia Continue Flomax Records from Dr. Lenoria Chime office requested We discussed that given his age, no further indication for rectal exam/PSA screening Pathophysiology of nocturia was discussed including multiple complaints including behavioral, fluid distribution, and possible undiagnosed sleep apnea, see below Recently adequate bladder emptying - Urinalysis, Complete - BLADDER SCAN AMB NON-IMAGING  2. Dysuria Dysuria only after drinking alcohol, suspect this is likely related to bladder irritation We discussed alternatives including abstaining from alcohol versus increasing fluids when alcohol is consumed No evidence of UTI  3. Snoring Given that the patient snores and has primarily only nighttime urinary symptoms, I suspect he may have undiagnosed untreated sleep apnea I have asked him to follow-up with his primary care physician and will send him a note as well regarding the possibility of pursuing sleep study   Return in about 1 year (around 05/30/2019) for PVR/ IPSS/ PVR (sign release for records from [place]).  Or sooner as needed  Vanna Scotland, MD  [facility] [address] [place], [place] [address] [phone]

## 2018-05-30 LAB — MICROSCOPIC EXAMINATION

## 2018-05-30 LAB — URINALYSIS, COMPLETE
Bilirubin, UA: NEGATIVE
Glucose, UA: NEGATIVE
Ketones, UA: NEGATIVE
Leukocytes, UA: NEGATIVE
Nitrite, UA: NEGATIVE
Protein, UA: NEGATIVE
RBC, UA: NEGATIVE
Specific Gravity, UA: 1.015 (ref 1.005–1.030)
Urobilinogen, Ur: 0.2 mg/dL (ref 0.2–1.0)
pH, UA: 6 (ref 5.0–7.5)

## 2018-06-03 ENCOUNTER — Telehealth: Payer: Self-pay | Admitting: Urology

## 2018-06-03 NOTE — Telephone Encounter (Signed)
Pt wife called on behalf of pt, states samples were given and was advised to call for an Rx if samples worked. Pt states the samples of Uribel (wasn't sure of mg/dosage) works and would like Rx called [facility] in [place]. Pt going out of town tomorrow and would really like this Rx before he leaves to go out of town. Please advise. Thanks.

## 2018-06-04 MED ORDER — URIBEL 118 MG PO CAPS: 1.0000 | ORAL_CAPSULE | Freq: Every day | ORAL | 11 refills | Status: DC | PRN

## 2018-06-04 NOTE — Telephone Encounter (Signed)
Yes, find to call it in.  He only has burning when he drinks alcohol, so would only take the medication just prior to that.  I would say daily as needed.  Disp #30 with refills.  Vanna ScotlandAshley Myia Bergh, MD

## 2018-06-04 NOTE — Telephone Encounter (Signed)
rx sent. Called to inform pt.

## 2018-06-04 NOTE — Telephone Encounter (Signed)
Okay for Rx?  

## 2018-06-04 NOTE — Addendum Note (Signed)
Addended by: Vickki HearingHOMPSON, CHRISTOPHER L on: 06/04/2018 01:52 PM   Modules accepted: Orders

## 2018-06-24 ENCOUNTER — Ambulatory Visit (INDEPENDENT_AMBULATORY_CARE_PROVIDER_SITE_OTHER): Payer: Medicare Other | Admitting: *Deleted

## 2018-06-24 DIAGNOSIS — R55 Syncope and collapse: Secondary | ICD-10-CM

## 2018-06-24 DIAGNOSIS — I639 Cerebral infarction, unspecified: Secondary | ICD-10-CM

## 2018-06-24 NOTE — Progress Notes (Signed)
[facility] Summary Report / Loop Recorder 

## 2018-06-25 LAB — CUP PACEART REMOTE DEVICE CHECK
Date Time Interrogation Session: 20190620033639
Implantable Pulse Generator Implant Date: 20180612

## 2018-07-28 ENCOUNTER — Ambulatory Visit (INDEPENDENT_AMBULATORY_CARE_PROVIDER_SITE_OTHER): Payer: Medicare Other | Admitting: *Deleted

## 2018-07-28 DIAGNOSIS — I639 Cerebral infarction, unspecified: Secondary | ICD-10-CM

## 2018-07-28 NOTE — Progress Notes (Signed)
[facility] Summary Report / Loop Recorder 

## 2018-08-08 LAB — CUP PACEART REMOTE DEVICE CHECK
Date Time Interrogation Session: 20190723044020
Implantable Pulse Generator Implant Date: 20180612

## 2018-08-26 LAB — CUP PACEART REMOTE DEVICE CHECK
Date Time Interrogation Session: 20190825063941
Implantable Pulse Generator Implant Date: 20180612

## 2018-08-29 ENCOUNTER — Ambulatory Visit (INDEPENDENT_AMBULATORY_CARE_PROVIDER_SITE_OTHER): Payer: Medicare Other | Admitting: *Deleted

## 2018-08-29 DIAGNOSIS — I639 Cerebral infarction, unspecified: Secondary | ICD-10-CM

## 2018-08-29 DIAGNOSIS — R55 Syncope and collapse: Secondary | ICD-10-CM

## 2018-08-29 NOTE — Progress Notes (Signed)
[facility] Summary Report / Loop Recorder 

## 2018-09-01 LAB — CUP PACEART REMOTE DEVICE CHECK
Date Time Interrogation Session: 20190927093920
Implantable Pulse Generator Implant Date: 20180612

## 2018-10-01 ENCOUNTER — Ambulatory Visit (INDEPENDENT_AMBULATORY_CARE_PROVIDER_SITE_OTHER): Payer: Medicare Other | Admitting: *Deleted

## 2018-10-01 DIAGNOSIS — I639 Cerebral infarction, unspecified: Secondary | ICD-10-CM

## 2018-10-01 DIAGNOSIS — R55 Syncope and collapse: Secondary | ICD-10-CM

## 2018-10-01 NOTE — Progress Notes (Signed)
[facility] Summary Report / Loop Recorder 

## 2018-10-12 ENCOUNTER — Other Ambulatory Visit: Payer: Self-pay | Admitting: Cardiovascular Disease

## 2018-10-14 ENCOUNTER — Telehealth: Payer: Self-pay

## 2018-10-14 MED ORDER — EZETIMIBE 10 MG PO TABS: 10.0000 mg | ORAL_TABLET | Freq: Every day | ORAL | 0 refills | Status: DC

## 2018-10-14 NOTE — Telephone Encounter (Signed)
RX for [facility]

## 2018-10-24 LAB — CUP PACEART REMOTE DEVICE CHECK
Date Time Interrogation Session: 20191030093900
Implantable Pulse Generator Implant Date: 20180612

## 2018-10-25 DIAGNOSIS — S72009A Fracture of unspecified part of neck of unspecified femur, initial encounter for closed fracture: Secondary | ICD-10-CM | POA: Insufficient documentation

## 2018-10-28 ENCOUNTER — Telehealth: Payer: Self-pay | Admitting: Cardiovascular Disease

## 2018-10-28 ENCOUNTER — Telehealth: Payer: Self-pay | Admitting: Cardiology

## 2018-10-28 NOTE — Telephone Encounter (Signed)
Patient wife [name] calling States that patient fell and had hip surgery while in [place] Wife states that the patient is on aspirin and they also gave a blood thinner shot of Lovonox 40 MG in stomach Wants to know Dr. Windell HummingbirdGollan's opinion on taking both Please call [name] to discuss

## 2018-10-28 NOTE — Telephone Encounter (Signed)
S/w patient's wife ok per DPR. They are visiting in [place] and patient fell and had to have hip surgery with screws and such on Sunday, 10/26/18. Wife is concerned because they have him on aspirin and patient got lovenox 40 mg as well yesterday. Today, the NP discontinued the Lovenox after learning that patient had an subarachnoid hemorrhage in the past. Today, patient is getting up and moving around more and tomorrow may be discharged to inpatient rehab. Patient is wearing SCD's and doing leg exercises in bed as well to prevent blood clots. Wife wants to make sure this is all ok with Dr Mariah MillingGollan. Advised her that the providers should be making the best decisions for the patient regarding best practice for patient having this surgery and ongoing plan of care. Advised that it is ok to trust them in their decision making and that it is best to go by what the doctors are saying there since Dr Mariah MillingGollan is not currently able to see and assess the patient.  She verbalized understanding and stated she is realizing that they are making good decisions for the patient.  She was very appreciative and will see us in January at patient's scheduled appointment.

## 2018-10-28 NOTE — Telephone Encounter (Signed)
Spoke w/ pt and requested that he send a manual transmission b/c his home monitor has not updated in at least 14 days. Patient is out of town and he feel and broke his hip and he is unsure when he will be home. His daughter is looking into getting him a new home monitor.

## 2018-11-03 ENCOUNTER — Ambulatory Visit (INDEPENDENT_AMBULATORY_CARE_PROVIDER_SITE_OTHER): Payer: Medicare Other

## 2018-11-03 DIAGNOSIS — I639 Cerebral infarction, unspecified: Secondary | ICD-10-CM | POA: Diagnosis not present

## 2018-11-03 NOTE — Progress Notes (Signed)
[facility] Summary Report / Loop Recorder 

## 2018-11-28 LAB — CUP PACEART REMOTE DEVICE CHECK
Date Time Interrogation Session: 20191202131103
Implantable Pulse Generator Implant Date: 20180612

## 2018-12-08 ENCOUNTER — Ambulatory Visit (INDEPENDENT_AMBULATORY_CARE_PROVIDER_SITE_OTHER): Payer: Medicare Other

## 2018-12-08 DIAGNOSIS — I639 Cerebral infarction, unspecified: Secondary | ICD-10-CM | POA: Diagnosis not present

## 2018-12-08 LAB — CUP PACEART REMOTE DEVICE CHECK
Date Time Interrogation Session: 20200104131044
Implantable Pulse Generator Implant Date: 20180612

## 2018-12-09 NOTE — Progress Notes (Signed)
[facility] Summary Report / Loop Recorder 

## 2018-12-11 NOTE — Progress Notes (Signed)
Cardiology Office Note  Date:  12/12/2018   ID:  Calvin ShortsGeorge [name], DOB [birth year], MRN [id number]  PCP:  Marguarite ArbourSparks, Jeffrey D, MD   Chief Complaint  Patient presents with  . other    12 month follow up. Meds reviewed by the pt. verbally. "doing well."     HPI:  [name] is a very pleasant 85 year-old gentleman with a history of  coronary artery disease,   stent placed to his proximal RCA in September 2007, taxis stent 3.0 x 24 mm, with stress test 08/2010 and May 2012,  previous syncopal episode (Etiology of his spell is uncertain),  50% carotid disease on the right CVA who presents for routine followup of his coronary artery disease,  Tremor, and stroke  In follow-up today he reports having recent fall while in [place] Was watching a football game, stood up and had a mechanical fall tripped over some shoes 10/24/2018 Suffered a hip fracture , was hospitalized  Following the surgery the physicians increased his lisinopril up to 20 mg daily and placed him on aspirin 325 mg daily He has started to have some upset stomach, wonders if it could be the high-dose aspirin  He has finished rehab, trying to schedule more rehab at home  Denies any significant shortness of breath, chest pain Patient's wife presents with him today, concerned about memory loss They have tried over-the-counter supplements which have not seem to help  Prior trip to [place] in 2018 developed bronchitis  We reviewed his loop monitor results In general this shows no significant arrhythmia, one episode of 6 minutes likely artifact but unable to exclude arrhythmia  he denies any tachycardia or palpitations  Still with peripheral vision issues from prior stroke  still with mild tremor Trying to get back to his exercise program Cholesterol at goal  EKG personally reviewed by myself on todays visit Shows normal sinus rhythm with rate 57 bpm unable to exclude old inferior MI  Other past medical history  reviewed February 2018 he had acute vision changes, word finding difficulty. [facility]/MRA showing acute stroke Details of scan as below: 1. Acute left parieto-occipital intraparenchymal hematoma with adjacent subarachnoid hemorrhage. This most likely represents hemorrhagic transformation from an embolic infarct given its configuration. No underlying mass. No other findings to suggest amyloid angiopathy. 2. Normal MRA of the head.  He had a CT scan of the neck showing 50% stenosis on the left, 10% on the right  At the time of his stroke he was on aspirin and Plavix. Plavix was held  echocardiogram  NORMAL LEFT VENTRICULAR SYSTOLIC FUNCTION WITH MILD LVH NORMAL RIGHT VENTRICULAR SYSTOLIC FUNCTION VALVULAR REGURGITATION: TRIVIAL AR, TRIVIAL TR VALVULAR STENOSIS: TRIVIAL AS VERY POOR SOUND TRANSMISSION-DEFINITY CONTRAST USED NO PRIOR STUDY FOR COMPARISON   Prior to his stroke he was started on Sinemet for tremor Wife reports this did not help very much  He had a nuclear Myoview earlier in 2014 that showed no ischemia. He has chronic dizziness when he changes position. This has been a chronic issue and he has learned to "live with it". No significant progression from previous visits.  stress test on Apr 20 2011 which showed no significant ischemia, normal ejection fraction estimated at 69%. There was a very small region of decreased perfusion suggestive of small region of old scar or attenuation artifact in the apical wall. Overall this was a low risk scan. He was able to perform on the  treadmill well with a maximum heart rate of 128 beats per minute, exercised for 8-1/2 minutes, peak blood pressure 194/81.   PMH:   has a past medical history of Acute embolic stroke (HCC) (02/01/2017), BPH (benign prostatic hyperplasia), Bradycardia (11/10/2015), Coronary artery disease, Dizziness (08/21/2012), DYSPEPSIA (07/31/2010), Hyperlipidemia (05/16/2010),  Hypertension, HYPERTENSION, BENIGN (05/16/2010), and Stenosis of right carotid artery (02/01/2017).  PSH:    Past Surgical History:  Procedure Laterality Date  . HIP SURGERY     right hip   . Intestinal Blockage    . LOOP RECORDER INSERTION N/A 05/14/2017   Procedure: Loop Recorder Insertion;  Surgeon: [name], Steven C, MD;  [facility]: [facility] INVASIVE CV LAB;  Service: Cardiovascular;  Laterality: N/A;  . SKIN CANCER EXCISION     x2  . TONSILLECTOMY      Current Outpatient Medications  Medication Sig Dispense Refill  . Apoaequorin (PREVAGEN) 10 MG CAPS Take by mouth daily.    Marland Kitchen. aspirin 325 MG tablet Take 325 mg by mouth daily.    Marland Kitchen. b complex vitamins tablet Take 1 tablet by mouth daily.    . cholecalciferol (VITAMIN D) 1000 units tablet Take 2,000 Units by mouth daily.    Marland Kitchen. ezetimibe (ZETIA) 10 MG tablet Take 1 tablet (10 mg total) by mouth daily. 90 tablet 0  . FLAXSEED, LINSEED, PO Take 2 g by mouth daily.    Marland Kitchen. lisinopril (PRINIVIL,ZESTRIL) 20 MG tablet Take 20 mg by mouth daily.     . nitroGLYCERIN (NITROSTAT) 0.4 MG SL tablet Place 1 tablet (0.4 mg total) under the tongue every 5 (five) minutes as needed for chest pain. (Patient taking differently: Place 0.4 mg under the tongue every 5 (five) minutes as needed for chest pain. ) 25 tablet 3  . pregabalin (LYRICA) 150 MG capsule Take 150 mg by mouth daily.     . simvastatin (ZOCOR) 40 MG tablet Take 40 mg by mouth every evening.     . Tamsulosin HCl (FLOMAX) 0.4 MG CAPS Take 0.8 mg by mouth daily.      No current facility-administered medications for this visit.      Allergies:   Codeine and Penicillins   Social History:  The patient  reports that he has never smoked. He has never used smokeless tobacco. He reports current alcohol use of about 6.0 - 8.0 standard drinks of alcohol per week. He reports that he does not use drugs.   Family History:   family history includes Cancer in an other family member; Hyperlipidemia in an other  family member; Hypertension in his mother and another family member; Other in an other family member.    Review of Systems: Review of Systems  Constitutional: Positive for malaise/fatigue.  Eyes:       Vision deficits  Respiratory: Negative.   Cardiovascular: Negative.   Gastrointestinal: Negative.   Musculoskeletal: Negative.        Gait instability  Neurological: Negative.        Word finding difficulty, peripheral vision deficit  Psychiatric/Behavioral: Positive for memory loss.  All other systems reviewed and are negative.    PHYSICAL EXAM: VS:  BP 118/70 (BP Location: Left Arm, Patient Position: Sitting, Cuff Size: Normal)   Pulse (!) 57   Ht 5' 10.5" (1.791 m)   Wt 181 lb 8 oz (82.3 kg)   BMI 25.67 kg/m  , BMI Body mass index is 25.67 kg/m.  Constitutional:  oriented to person, place, and time. No distress.  HENT:  Head: Grossly  normal Eyes:  no discharge. No scleral icterus.  Neck: No JVD, no carotid bruits  Cardiovascular: Regular rate and rhythm, no murmurs appreciated Pulmonary/Chest: Clear to auscultation bilaterally, no wheezes or rails Abdominal: Soft.  no distension.  no tenderness.  Musculoskeletal: Normal range of motion Neurological:  normal muscle tone. Coordination normal. No atrophy Skin: Skin warm and dry Psychiatric: normal affect, pleasant   Recent Labs: No results found for requested labs within last 8760 hours.    Lipid Panel Lab Results  Component Value Date   CHOL 122 01/23/2010   HDL 33.8 01/23/2010   LDLCALC 69.6 01/23/2010   TRIG 93 01/23/2010    total chol  Wt Readings from Last 3 Encounters:  12/12/18 181 lb 8 oz (82.3 kg)  05/29/18 182 lb (82.6 kg)  12/10/17 190 lb 4 oz (86.3 kg)     ASSESSMENT AND PLAN:   Hyperlipidemia, unspecified hyperlipidemia type - Plan: EKG 12-Lead Cholesterol at goal, continue current medications  HYPERTENSION, BENIGN - Plan: EKG 12-Lead Recommend he decrease lisinopril back to 10 mg  daily  Atherosclerosis of native coronary artery of native heart without angina pectoris - Decrease aspirin down to 81 mg daily Cholesterol at goal  Syncope, unspecified syncope type - Plan: EKG 12-Lead No recent episodes of syncope or near syncope   Monitor in place, no significant arrhythmia  Acute embolic stroke (HCC) Loop monitor in place,no arrhythmia  6 minutes of possible arrhythmia, unable to exclude artifact on recent download  Bradycardia Asymptomatic bradycardia  Stenosis of right carotid artery 50% disease on the right  recommended aggressive cholesterol management  Essential tremor Followed by neurology   Total encounter time more than 25 minutes  Greater than 50% was spent in counseling and coordination of care with the patient  Disposition:   F/U  12 months   Orders Placed This Encounter  Procedures  . EKG 12-Lead     Signed, [name] Arbour, M.D., Ph.D. 12/12/2018  [facility] [place], [place] [phone]

## 2018-12-12 ENCOUNTER — Encounter: Payer: Self-pay | Admitting: Cardiovascular Disease

## 2018-12-12 ENCOUNTER — Ambulatory Visit (INDEPENDENT_AMBULATORY_CARE_PROVIDER_SITE_OTHER): Payer: Medicare Other | Admitting: Cardiovascular Disease

## 2018-12-12 VITALS — BP 118/70 | HR 57 | Ht 70.5 in | Wt 181.5 lb

## 2018-12-12 DIAGNOSIS — E785 Hyperlipidemia, unspecified: Secondary | ICD-10-CM | POA: Diagnosis not present

## 2018-12-12 DIAGNOSIS — I10 Essential (primary) hypertension: Secondary | ICD-10-CM

## 2018-12-12 DIAGNOSIS — I6521 Occlusion and stenosis of right carotid artery: Secondary | ICD-10-CM | POA: Diagnosis not present

## 2018-12-12 DIAGNOSIS — I4940 Unspecified premature depolarization: Secondary | ICD-10-CM | POA: Diagnosis not present

## 2018-12-12 DIAGNOSIS — I25118 Atherosclerotic heart disease of native coronary artery with other forms of angina pectoris: Secondary | ICD-10-CM | POA: Diagnosis not present

## 2018-12-12 DIAGNOSIS — I639 Cerebral infarction, unspecified: Secondary | ICD-10-CM

## 2018-12-12 DIAGNOSIS — R55 Syncope and collapse: Secondary | ICD-10-CM

## 2018-12-12 MED ORDER — LISINOPRIL 10 MG PO TABS: 10.0000 mg | ORAL_TABLET | Freq: Every day | ORAL | 3 refills | Status: DC

## 2018-12-12 NOTE — Patient Instructions (Addendum)
Ask about B12 and Free testosterone for energy  Echo in 2018, normal   Medication Instructions:   Ok to decrease the lisinopril down to 10 mg daily  Aspirin down to 81 mg daily  If you need a refill on your cardiac medications before your next appointment, please call your pharmacy.    Lab work: No new labs needed   If you have labs (blood work) drawn today and your tests are completely normal, you will receive your results only by: Marland Kitchen MyChart Message (if you have MyChart) OR . A paper copy in the mail If you have any lab test that is abnormal or we need to change your treatment, we will call you to review the results.   Testing/Procedures: No new testing needed   Follow-Up: At [facility], you and your health needs are our priority.  As part of our continuing mission to provide you with exceptional heart care, we have created designated Provider Care Teams.  These Care Teams include your primary Cardiologist (physician) and Advanced Practice Providers (APPs -  Physician Assistants and Nurse Practitioners) who all work together to provide you with the care you need, when you need it.  . You will need a follow up appointment in 12 months .   Please call our office 2 months in advance to schedule this appointment.    . Providers on your designated Care Team:   . Nicolasa Ducking, NP . Eula Listen, PA-C . Marisue Ivan, PA-C  Any Other Special Instructions Will Be Listed Below (If Applicable).  For educational health videos Log in to : [email or web address] Or : [email or web address], password : [email or web address]

## 2018-12-16 ENCOUNTER — Encounter: Payer: Self-pay | Admitting: Internal Medicine

## 2018-12-16 ENCOUNTER — Ambulatory Visit (INDEPENDENT_AMBULATORY_CARE_PROVIDER_SITE_OTHER): Payer: Medicare Other | Admitting: Internal Medicine

## 2018-12-16 VITALS — BP 132/70 | HR 57 | Ht 70.5 in | Wt 180.8 lb

## 2018-12-16 DIAGNOSIS — R002 Palpitations: Secondary | ICD-10-CM

## 2018-12-16 DIAGNOSIS — I639 Cerebral infarction, unspecified: Secondary | ICD-10-CM

## 2018-12-16 DIAGNOSIS — I6521 Occlusion and stenosis of right carotid artery: Secondary | ICD-10-CM

## 2018-12-16 DIAGNOSIS — Z959 Presence of cardiac and vascular implant and graft, unspecified: Secondary | ICD-10-CM

## 2018-12-16 NOTE — Progress Notes (Signed)
Patient Care Team: [name], MD as PCP - General (Unknown Physician Specialty)   HPI  [name]. is a 85 y.o. male Seen in follow-up for cryptogenic stroke for which he underwent loop recorder insertion 6/18.  No intercurrent neurological events.  No intercurrent palpitations.  While in [place] visiting his children he fell and broke his hip.  Got his foot caught.  The patient denies chest pain, shortness of breath, nocturnal dyspnea, orthopnea or peripheral edema.  There have been no palpitations, lightheadedness or syncope.    Records and Results Reviewed   Past Medical History:  Diagnosis Date  . Acute embolic stroke (HCC) 02/01/2017  . BPH (benign prostatic hyperplasia)   . Bradycardia 11/10/2015  . Coronary artery disease   . Dizziness 08/21/2012  . DYSPEPSIA 07/31/2010   Qualifier: Diagnosis of  By: Dalphine Handing    . Hyperlipidemia 05/16/2010   Qualifier: Diagnosis of  By: Mariah Milling MD, [name]     . Hypertension   . HYPERTENSION, BENIGN 05/16/2010   Qualifier: Diagnosis of  By: Mariah Milling MD, [name]    . Stenosis of right carotid artery 02/01/2017    Past Surgical History:  Procedure Laterality Date  . HIP SURGERY     right hip   . Intestinal Blockage    . LOOP RECORDER INSERTION N/A 05/14/2017   Procedure: Loop Recorder Insertion;  Surgeon: [facility] Salvia, MD;  Location: [facility] INVASIVE CV LAB;  Service: Cardiovascular;  Laterality: N/A;  . SKIN CANCER EXCISION     x2  . TONSILLECTOMY      Current Meds  Medication Sig  . Apoaequorin (PREVAGEN) 10 MG CAPS Take by mouth daily.  Marland Kitchen aspirin 81 MG tablet Take 1 tablet (81 mg total) by mouth daily.  Marland Kitchen b complex vitamins tablet Take 1 tablet by mouth daily.  . cholecalciferol (VITAMIN D) 1000 units tablet Take 2,000 Units by mouth daily.  Marland Kitchen ezetimibe (ZETIA) 10 MG tablet Take 1 tablet (10 mg total) by mouth daily.  Marland Kitchen FLAXSEED, LINSEED, PO Take 2 g by mouth daily.  Marland Kitchen lisinopril (PRINIVIL,ZESTRIL) 10  MG tablet Take 1 tablet (10 mg total) by mouth daily.  . nitroGLYCERIN (NITROSTAT) 0.4 MG SL tablet Place 1 tablet (0.4 mg total) under the tongue every 5 (five) minutes as needed for chest pain. (Patient taking differently: Place 0.4 mg under the tongue every 5 (five) minutes as needed for chest pain. )  . pregabalin (LYRICA) 150 MG capsule Take 150 mg by mouth daily.   . simvastatin (ZOCOR) 40 MG tablet Take 40 mg by mouth every evening.   . Tamsulosin HCl (FLOMAX) 0.4 MG CAPS Take 0.8 mg by mouth daily.     Allergies  Allergen Reactions  . Codeine Nausea Only    Other reaction(s): Vomiting  . Penicillins     Rash       Review of Systems negative except from HPI and PMH  Physical Exam BP 132/70 (BP Location: Left Arm, Patient Position: Sitting, Cuff Size: Normal)   Pulse (!) 57   Ht 5' 10.5" (1.791 m)   Wt 180 lb 12 oz (82 kg)   BMI 25.57 kg/m  Well developed and nourished in no acute distress HENT normal Neck supple with JVP-flat Clear Regular rate and rhythm, no murmurs or gallops Abd-soft with active BS No Clubbing cyanosis edema Skin-warm and dry A & Oriented  Grossly normal sensory and motor function  ECG   Assessment and  Plan   Cryptogenic stroke with hemorrhagic transformation  Palpitations-flutters  Hypertension  Hyperlipidemia   No intercurrent atrial fibrillation.  Blood pressure well controlled.  We will see again as needed and he will be followed remotely by his device Current medicines are reviewed at length with the patient today .  The patient does not  have concerns regarding medicines.

## 2018-12-16 NOTE — Patient Instructions (Addendum)
Medication Instructions:  - Your physician recommends that you continue on your current medications as directed. Please refer to the Current Medication list given to you today.  If you need a refill on your cardiac medications before your next appointment, please call your pharmacy.   Lab work: - none ordered  If you have labs (blood work) drawn today and your tests are completely normal, you will receive your results only by: Marland Kitchen. MyChart Message (if you have MyChart) OR . A paper copy in the mail If you have any lab test that is abnormal or we need to change your treatment, we will call you to review the results.  Testing/Procedures: - none ordered  Follow-Up: - as needed with Dr. Graciela HusbandsKlein  Any Other Special Instructions Will Be Listed Below (If Applicable). - N/A

## 2018-12-18 LAB — CUP PACEART INCLINIC DEVICE CHECK
Date Time Interrogation Session: 20200114175106
Implantable Pulse Generator Implant Date: 20180612

## 2019-01-06 DIAGNOSIS — F0390 Unspecified dementia without behavioral disturbance: Secondary | ICD-10-CM | POA: Insufficient documentation

## 2019-01-08 ENCOUNTER — Ambulatory Visit (INDEPENDENT_AMBULATORY_CARE_PROVIDER_SITE_OTHER): Payer: Medicare Other

## 2019-01-08 DIAGNOSIS — I639 Cerebral infarction, unspecified: Secondary | ICD-10-CM | POA: Diagnosis not present

## 2019-01-09 LAB — CUP PACEART REMOTE DEVICE CHECK
Date Time Interrogation Session: 20200206134117
Implantable Pulse Generator Implant Date: 20180612

## 2019-01-19 NOTE — Progress Notes (Signed)
[facility] Summary Report / Loop Recorder 

## 2019-02-05 ENCOUNTER — Ambulatory Visit: Payer: Medicare Other

## 2019-02-06 DIAGNOSIS — S72001D Fracture of unspecified part of neck of right femur, subsequent encounter for closed fracture with routine healing: Secondary | ICD-10-CM | POA: Insufficient documentation

## 2019-02-10 ENCOUNTER — Ambulatory Visit (INDEPENDENT_AMBULATORY_CARE_PROVIDER_SITE_OTHER): Payer: Medicare Other | Admitting: *Deleted

## 2019-02-10 DIAGNOSIS — I639 Cerebral infarction, unspecified: Secondary | ICD-10-CM

## 2019-02-11 LAB — CUP PACEART REMOTE DEVICE CHECK
Date Time Interrogation Session: 20200310140745
Implantable Pulse Generator Implant Date: 20180612

## 2019-02-12 ENCOUNTER — Other Ambulatory Visit: Payer: Self-pay

## 2019-02-12 ENCOUNTER — Ambulatory Visit: Payer: Medicare Other | Attending: Orthopedic Surgery

## 2019-02-12 DIAGNOSIS — M25551 Pain in right hip: Secondary | ICD-10-CM | POA: Insufficient documentation

## 2019-02-12 DIAGNOSIS — R2689 Other abnormalities of gait and mobility: Secondary | ICD-10-CM | POA: Insufficient documentation

## 2019-02-12 DIAGNOSIS — M6281 Muscle weakness (generalized): Secondary | ICD-10-CM | POA: Diagnosis present

## 2019-02-13 NOTE — Therapy (Addendum)
[facility] MAIN [facility] SERVICES [address] [place], [place], [address] Phone: [phone]   Fax:  [phone]  Physical Therapy Evaluation  Patient Details  Name: [name]. MRN: [id number] Date of Birth: [birth year] Referring Provider (PT): Dr. Ernest Pine   Encounter Date: 02/12/2019  PT End of Session - 02/16/19 1331    Visit Number  1    Number of Visits  25    Date for PT Re-Evaluation  05/07/19    PT Start Time  0930    PT Stop Time  1030    PT Time Calculation (min)  60 min    Equipment Utilized During Treatment  Gait belt    Activity Tolerance  Patient tolerated treatment well    Behavior During Therapy  WFL for tasks assessed/performed       Past Medical History:  Diagnosis Date  . Acute embolic stroke (HCC) 02/01/2017  . BPH (benign prostatic hyperplasia)   . Bradycardia 11/10/2015  . Coronary artery disease   . Dizziness 08/21/2012  . DYSPEPSIA 07/31/2010   Qualifier: Diagnosis of  By: Dalphine Handing    . Hyperlipidemia 05/16/2010   Qualifier: Diagnosis of  By: Mariah Milling MD, [name]     . Hypertension   . HYPERTENSION, BENIGN 05/16/2010   Qualifier: Diagnosis of  By: Mariah Milling MD, [name]    . Stenosis of right carotid artery 02/01/2017    Past Surgical History:  Procedure Laterality Date  . HIP SURGERY     right hip   . Intestinal Blockage    . LOOP RECORDER INSERTION N/A 05/14/2017   Procedure: Loop Recorder Insertion;  Surgeon: Duke Salvia, MD;  Location: [facility] INVASIVE CV LAB;  Service: Cardiovascular;  Laterality: N/A;  . SKIN CANCER EXCISION     x2  . TONSILLECTOMY      There were no vitals filed for this visit.     Subjective Assessment - 02/16/19 1306    Subjective  S/p R hip percutaneous pinning     Pertinent History  The patient is a 85 y.o. male who was referred for physical therapy to treat his right hip. He is over 3 months status post percutaneous pinning of a right femoral neck fracture  performed by Dr. Onnie Graham in [place]. Fall occurred on 10/25/18. After the surgery he went to SNF for 1 week and then discharged to his daughters house for 6 weeks. After returning home to [place] he started PT at an outpatient clinic in [place] and then transferred to [facility]. He was working with physical therapy at [facility] for "multiple weeks." He had scheduling issues with [facility] so he is here at [facility] for an evaluation. He is currently using a single point cane for ambulation for the last month. He would like to transfer off of his cane to no assistive device. He has been weightbearing as tolerated for some time.  He denies any significant resting groin or thigh pain upon arrival but does have some intermittent anterior and posterior hip pain on occasion. History of CVA 2 years ago but pt denies any residual deficits. Otherwise he denies any specific questions or concerns. Lives with wife in multilevel house but can stay on main floor, 2 steps to enter with L railing. No reported trouble with stairs. Independent with ADLs/IADLs and drives.    Limitations  Walking    Patient Stated Goals  "I want to walk without a cane." Pt would like to improve  his strength, gait, and balance    Currently in Pain?  No/denies    Pain Score  0-No pain   Worst: 4/10, Best: 0/10   Pain Location  Hip    Pain Orientation  Right    Pain Descriptors / Indicators  Sharp;Pressure    Pain Type  Chronic pain    Pain Radiating Towards  down RLE anterior thigh to the level of the knee;     Pain Onset  More than a month ago    Pain Frequency  Intermittent    Aggravating Factors    "putting pressure on it," going up steps     Pain Relieving Factors  Rest, Tylenol, hasn't tried ice/heat     Multiple Pain Sites  No            SUBJECTIVE Chief complaint:  The patient is a 85 y.o. male who was referred for physical therapy to treat his right hip. He is over 3 months status post percutaneous  pinning of a right femoral neck fracture performed by Dr. Onnie Graham in [place]. Fall occurred on 10/25/18. After the surgery he went to SNF for 1 week and then discharged to his daughters house for 6 weeks. After returning home to [place] he started PT at an outpatient clinic in [place] and then transferred to [facility]. He was working with physical therapy at [facility] for "multiple weeks." He had scheduling issues with [facility] so he is here at [facility] for an evaluation. He is currently using a single point cane for ambulation for the last month. He would like to transfer off of his cane to no assistive device. He has been weightbearing as tolerated for some time.  He denies any significant resting groin or thigh pain upon arrival but does have some intermittent anterior and posterior hip pain on occasion. History of CVA 2 years ago but pt denies any residual deficits. Otherwise he denies any specific questions or concerns. Lives with wife in multilevel house but can stay on main floor, 2 steps to enter with L railing. No reported trouble with stairs. Independent with ADLs/IADLs and drives.  Onset: 10/25/18 MD: Dr. Ernest Pine Follow-up appt with MD: Not reported Pain: 0/10 Present, 0/10 Best, 4/10 Worst: Aggravating: "putting pressure on it," going up steps  Easing: factors: Rest, Tylenol, hasn't tried ice/heat  24 hour pain behavior: No change throughout the day, pain doesn't wake him up at night Hip surgery: Yes Recent hip trauma: Yes Prior history of hip injury or pain: No Pain quality: pain quality: sharp in anterior groin, posterior hip to R knee is "pressure" pain.  Radiating symptoms: Yes down RLE anterior thigh to the level of the knee;  Numbness/Tingling: No, bilateral LE neuropathy but unrelated to injury or surgery   OBJECTIVE  MUSCULOSKELETAL: Tremor: Absent Bulk: Normal Tone: Normal, no spasticity, rigidity, or clonus No trophic changes noted to lower extremities.  No ecchymosis, erythema, or edema noted  Posture No gross abnormalities noted in standing or seated posture  Gait Decreased stance time on RLE with significant R toe out during gait.  Strength R/L 4+/4+ Hip flexion 5/5 Hip external rotation 5/5 Hip internal rotation 3/3+ Hip extension  4-/4- Hip abduction 4-/4- Hip adduction 5/5 Knee extension 4+/4+ Knee flexion 5/5 Ankle Dorsiflexion *indicates pain  AROM Hip R/L Flexion: 95*/120 Internal Rotation: 18/40  External Rotation: 45/40 degrees  *indicates pain  Muscle Length R/L Hamstring length:  60 degrees/50 degrees Quad length Michela Pitcher): WNL   NEUROLOGICAL:  Mental Status Patient is oriented to person, place and time.  Recent memory appears mildly limited. Remote memory is intact.  Attention span and concentration are intact.  Expressive speech is intact.  Patient's fund of knowledge is within normal limits for educational level.  Sensation Grossly intact to light touch bilateral LEs as determined by testing dermatomes L2-S2 Proprioception and hot/cold testing deferred on this date  Reflexes Deferred   SPECIAL TESTS  Motor Control: Step down/up assessment: Not performed Squat assessment: Not performed Single Leg Trendelenburg Test: Not performed                    [facility] PT Assessment - 02/16/19 1317      Assessment   Medical Diagnosis  Closed fracture of R hip    Referring Provider (PT)  Dr. Ernest Pine    Onset Date/Surgical Date  10/26/19    Hand Dominance  Right    Next MD Visit  Not reported    Prior Therapy  SNF in [place], [facility] PT, and OP PT      Precautions   Precautions  Fall      Restrictions   Weight Bearing Restrictions  No      Balance Screen   Has the patient fallen in the past 6 months  Yes    How many times?  1    Has the patient had a decrease in activity level because of a fear of falling?   Yes    Is the patient reluctant to leave their home because of a fear of falling?    No      Home Environment   Living Environment  Private residence    Living Arrangements  Spouse/significant other    Type of Home  House    Home Access  Stairs to enter    Entrance Stairs-Number of Steps  2    Entrance Stairs-Rails  Left    Home Layout  Two level    Alternate Level Stairs-Rails  Right    Home Equipment  Walker - 2 wheels      Prior Function   Level of Independence  Independent    Vocation  Retired      IT consultant   Overall Cognitive Status  No family/caregiver present to determine baseline cognitive functioning      Observation/Other Assessments   Other Surveys   Lower Extremity Functional Scale    Lower Extremity Functional Scale   58/80      Standardized Balance Assessment   Standardized Balance Assessment  Five Times Sit to Stand;10 meter walk test    Five times sit to stand comments   16.4s    10 Meter Walk  12.3 self selected = 0.81 m/s, 10.6s = 0.94 m/s fastest                 Objective measurements completed on examination: See above findings.              PT Education - 02/16/19 1331    Education Details  Plan of care    Person(s) Educated  Patient    Methods  Explanation    Comprehension  Verbalized understanding       PT Short Term Goals - 02/16/19 1341      PT SHORT TERM GOAL #1   Title  Pt will be independent with HEP in order to decrease ankle pain and increase strength in order to improve pain-free function at home and work.  Time  6    Period  Weeks    Status  New    Target Date  03/26/19        PT Long Term Goals - 02/16/19 1341      PT LONG TERM GOAL #1   Title  Pt will increase LEFS by at least 9 points in order to demonstrate significant improvement in lower extremity function.      Baseline  02/12/19: 58/80    Time  12    Period  Weeks    Status  New    Target Date  05/07/19      PT LONG TERM GOAL #2   Title  Pt will decrease worst R hip pain as reported on NPRS by at least 3 points in order to  demonstrate clinically significant reduction in ankle/foot pain.     Baseline  02/12/19: worst: 4/10    Time  12    Period  Weeks    Status  New    Target Date  05/07/19      PT LONG TERM GOAL #3   Title  Pt will increase strength of R hip extension and abduction by at least 1/2 MMT grade in order to demonstrate improvement in strength and function    Baseline  02/12/19: R hip extension 3/5, R hip abduction: 4-/5    Time  12    Period  Weeks    Status  New    Target Date  05/07/19             Plan - 02/16/19 1332    Clinical Impression Statement  Pt is a pleasant 85 year-old male referred for PT to treat his R hip. Over 3 months ago pt underwent percutaneous pinning of a right femoral neck fracture while he was visiting his daughter in [place]. Pt present ambulating with a single point cane with a mildly antalgic gait. He ambulates with significant R toe out and does have very limited R hip internal rotation at 18 degrees compared to 40 degrees on the left. His R hip flexion is also limited around 90 degrees and is painful at end range compared to 120 degrees of L hip flexion. He has weakness in his R hip most notable with 3/5 extension and 4-/5 abduction/adduction. He reports intermittent sharp anterior R groin pain and posterior R hip "pressure pain."  Pt will benefit from PT services to address deficits in strength, mobility, and pain in order to return to full function at home with less R hip pain.     Personal Factors and Comorbidities  Age;Comorbidity 1;Comorbidity 2;Comorbidity 3+    Comorbidities  CVA, CAD, HTN    Examination-Activity Limitations  Bend;Transfers    Examination-Participation Restrictions  Community Activity    Stability/Clinical Decision Making  Evolving/Moderate complexity    Clinical Decision Making  Moderate    Rehab Potential  Good    PT Frequency  2x / week    PT Duration  12 weeks    PT Treatment/Interventions  ADLs/Self Care Home Management;Aquatic  Therapy;Biofeedback;Canalith Repostioning;Cryotherapy;Electrical Stimulation;Iontophoresis /ml Dexamethasone;Moist Heat;Traction;Ultrasound;DME Instruction;Gait training;Stair training;Functional mobility training;Therapeutic activities;Therapeutic exercise;Balance training;Neuromuscular re-education;Patient/family education;Manual techniques;Passive range of motion;Dry needling;Vestibular    PT Next Visit Plan  BERG, review HEP provided by previous therapist and modify/add as necessary, progress strength and balance    PT Home Exercise Plan  Continue exercises issued by previous therapist and bring in for review    Consulted and Agree with Plan of Care  Patient  Patient will benefit from skilled therapeutic intervention in order to improve the following deficits and impairments:  Abnormal gait, Decreased balance, Difficulty walking, Decreased strength  Visit Diagnosis: Pain in right hip - Plan: PT plan of care cert/re-cert  Muscle weakness (generalized) - Plan: PT plan of care cert/re-cert  Other abnormalities of gait and mobility - Plan: PT plan of care cert/re-cert     Problem List Patient Active Problem List   Diagnosis Date Noted  . Stenosis of right carotid artery 02/01/2017  . Acute embolic stroke (HCC) 02/01/2017  . Bradycardia 11/10/2015  . Essential tremor 06/03/2015  . Dizziness 08/21/2012  . Syncope 04/10/2011  . CALF PAIN, LEFT 10/11/2010  . DYSPEPSIA 07/31/2010  . CHEST DISCOMFORT 07/31/2010  . Hyperlipidemia 05/16/2010  . HYPERTENSION, BENIGN 05/16/2010  . CAD, NATIVE VESSEL 05/16/2010   Lynnea Maizes PT, DPT, GCS  , 02/16/2019, 2:03 PM  [facility] [facility] MAIN [facility] SERVICES [address] [place], [place], [address] Phone: [phone]   Fax:  [phone]  Name: Calvin Ryba [facility] MRN: [id number] Date of Birth: [birth year]

## 2019-02-16 ENCOUNTER — Other Ambulatory Visit: Payer: Self-pay | Admitting: Internal Medicine

## 2019-02-16 NOTE — Addendum Note (Signed)
Addended by: Ria Comment D on: 02/16/2019 02:03 PM   Modules accepted: Orders

## 2019-02-17 ENCOUNTER — Ambulatory Visit: Payer: Medicare Other

## 2019-02-17 NOTE — Progress Notes (Signed)
[facility] Summary Report / Loop Recorder 

## 2019-02-19 ENCOUNTER — Ambulatory Visit: Payer: Medicare Other

## 2019-02-24 NOTE — Therapy (Signed)
[facility] MAIN [facility] SERVICES [address] [place], [place], [address] Phone: [phone]   Fax:  [phone]  Patient Details  Name: [name]. MRN: [id number] Date of Birth: [birth year] Referring Provider:  No ref. provider found  Encounter Date: 02/24/2019   PT called and attempted to leave a message for pt to notify of outpatient physical therapy clinic closure secondary to COVID-19 precautions. Pt has a voicemail however his message reports that his voicemail is "full and no longer accepting new messages." Unable to leave a message for pt at this time requesting call back to answer any questions or concerns regarding his home physical therapy program while clinic is closed.  Lynnea Maizes PT, DPT, GCS  Huprich,Jason 02/24/2019, 3:30 PM  [place] [facility] MAIN [facility] SERVICES [address] [place], [place], [address] Phone: [phone]   Fax:  [phone]

## 2019-03-12 NOTE — Therapy (Signed)
[place] MAIN [facility] SERVICES [address] [place], [place], [address] Phone: [phone]   Fax:  [phone]  Patient Details  Name: [name]. MRN: [id number] Date of Birth: Jan 18, 1933 Referring Provider:  No ref. provider found  Encounter Date: 03/12/2019   The [facility] outpatient clinics are closed at this time due to the COVID-19 epidemic. The patient was contacted in regards to their therapy services. The patient denies any interest in telehealth services at this time. The pt is in agreement that they are safe and consent to being on hold for therapy services until the [facility] outpatient facilities reopen. At such time, the patient will be contacted to schedule an appointment to resume therapy services.      Lynnea Maizes PT, DPT, GCS  [name] 03/12/2019, 2:48 PM  [place] [facility] MAIN [facility] SERVICES [address] [place], [place], [address] Phone: [phone]   Fax:  [phone]

## 2019-03-16 ENCOUNTER — Other Ambulatory Visit: Payer: Self-pay

## 2019-03-16 ENCOUNTER — Ambulatory Visit (INDEPENDENT_AMBULATORY_CARE_PROVIDER_SITE_OTHER): Payer: Medicare Other | Admitting: *Deleted

## 2019-03-16 DIAGNOSIS — I639 Cerebral infarction, unspecified: Secondary | ICD-10-CM | POA: Diagnosis not present

## 2019-03-16 DIAGNOSIS — R55 Syncope and collapse: Secondary | ICD-10-CM

## 2019-03-16 LAB — CUP PACEART REMOTE DEVICE CHECK
Date Time Interrogation Session: 20200412143605
Implantable Pulse Generator Implant Date: 20180612

## 2019-03-23 NOTE — Progress Notes (Signed)
[facility] Summary Report / Loop Recorder 

## 2019-04-17 ENCOUNTER — Other Ambulatory Visit: Payer: Self-pay

## 2019-04-17 ENCOUNTER — Ambulatory Visit (INDEPENDENT_AMBULATORY_CARE_PROVIDER_SITE_OTHER): Payer: Medicare Other | Admitting: *Deleted

## 2019-04-17 DIAGNOSIS — I639 Cerebral infarction, unspecified: Secondary | ICD-10-CM

## 2019-04-17 LAB — CUP PACEART REMOTE DEVICE CHECK
Date Time Interrogation Session: 20200515143948
Implantable Pulse Generator Implant Date: 20180612

## 2019-04-20 NOTE — Progress Notes (Signed)
[facility] Summary Report / Loop Recorder 

## 2019-04-21 ENCOUNTER — Other Ambulatory Visit: Payer: Self-pay

## 2019-04-21 MED ORDER — TAMSULOSIN HCL 0.4 MG PO CAPS: 0.8000 mg | ORAL_CAPSULE | Freq: Every day | ORAL | 0 refills | Status: DC

## 2019-05-05 ENCOUNTER — Ambulatory Visit: Payer: Medicare Other | Attending: Orthopedic Surgery

## 2019-05-05 ENCOUNTER — Other Ambulatory Visit: Payer: Self-pay

## 2019-05-05 DIAGNOSIS — M6281 Muscle weakness (generalized): Secondary | ICD-10-CM | POA: Insufficient documentation

## 2019-05-05 DIAGNOSIS — M25551 Pain in right hip: Secondary | ICD-10-CM | POA: Diagnosis present

## 2019-05-05 DIAGNOSIS — R2689 Other abnormalities of gait and mobility: Secondary | ICD-10-CM | POA: Insufficient documentation

## 2019-05-05 NOTE — Therapy (Signed)
[place] MAIN [facility] SERVICES [address] [place], [place], [address] Phone: [phone]   Fax:  [phone]  Physical Therapy Treatment/Recertification  Patient Details  Name: [name]. MRN: [id number] Date of Birth: [birth year] Referring Provider (PT): Dr. Marry Guan   Encounter Date: 05/05/2019  PT End of Session - 05/05/19 0926    Visit Number  2    Number of Visits  49    Date for PT Re-Evaluation  07/28/19    PT Start Time  0930    PT Stop Time  1015    PT Time Calculation (min)  45 min    Equipment Utilized During Treatment  Gait belt    Activity Tolerance  Patient tolerated treatment well    Behavior During Therapy  WFL for tasks assessed/performed       Past Medical History:  Diagnosis Date  . Acute embolic stroke ([place]) 02/07/487  . BPH (benign prostatic hyperplasia)   . Bradycardia 11/10/2015  . Coronary artery disease   . Dizziness 08/21/2012  . DYSPEPSIA 07/31/2010   Qualifier: Diagnosis of  By: [name]    . Hyperlipidemia 05/16/2010   Qualifier: Diagnosis of  By: [name], [name]     . Hypertension   . HYPERTENSION, BENIGN 05/16/2010   Qualifier: Diagnosis of  By: [name], [name]    . Stenosis of right carotid artery 02/01/2017    Past Surgical History:  Procedure Laterality Date  . HIP SURGERY     right hip   . Intestinal Blockage    . LOOP RECORDER INSERTION N/A 05/14/2017   Procedure: Loop Recorder Insertion;  Surgeon: [name], MD;  Location: [place] [facility];  Service: Cardiovascular;  Laterality: N/A;  . SKIN CANCER EXCISION     x2  . TONSILLECTOMY      There were no vitals filed for this visit.  Subjective Assessment - 05/05/19 0926    Subjective  Pt reports that he is doing alright today. He states that his R hip pain has gradually improved over the last few months. No pain reported upon arrival today. Pt states that occasionally he will get a sharp pain in his hip and he  rates it as 4/10. Pt has not been doing any specific R hip exercises but has stayed active working in the yard. He has a return appointment to see Dr. Marry Guan with orthopedics on 05/07/19. Pt does report low back pain when he is bending over to pick things off the floor.     Pertinent History  The patient is a 85 y.o. male who was referred for physical therapy to treat his right hip. He is over 3 months status post percutaneous pinning of a right femoral neck fracture performed by [name] in [place]. Fall occurred on 10/25/18. After the surgery he went to SNF for 1 week and then discharged to his daughters house for 6 weeks. After returning home to [place] he started PT at an outpatient clinic in [place] and then transferred to [facility]. He was working with physical therapy at [facility] for "multiple weeks." He had scheduling issues with [facility] so he is here at [facility] for an evaluation. He is currently using a single point cane for ambulation for the last month. He would like to transfer off of his cane to no assistive device. He has been weightbearing as tolerated for some time.  He denies any significant resting groin or thigh pain  upon arrival but does have some intermittent anterior and posterior hip pain on occasion. History of CVA 2 years ago but pt denies any residual deficits. Otherwise he denies any specific questions or concerns. Lives with wife in multilevel house but can stay on main floor, 2 steps to enter with L railing. No reported trouble with stairs. Independent with ADLs/IADLs and drives.    Limitations  Walking    Patient Stated Goals  "I want to walk without a cane." Pt would like to improve his strength, gait, and balance    Currently in Pain?  No/denies    Pain Score  0-No pain   Worst: 2/10, Best: 0/10   Pain Location  Hip    Pain Orientation  Right    Pain Descriptors / Indicators  Sharp    Pain Type  Chronic pain    Pain Onset  More than a month ago     Pain Frequency  Intermittent    Aggravating Factors   No provocative factors of which he is aware    Pain Relieving Factors  Rest    Multiple Pain Sites  No          [facility] PT Assessment - 05/05/19 0938      Assessment   Medical Diagnosis  Closed fracture of R hip    Referring Provider (PT)  Dr. Marry Guan    Onset Date/Surgical Date  10/26/19    Hand Dominance  Right    Next MD Visit  05/07/19    Prior Therapy  SNF in [place], [place] PT, and [facility] PT      Precautions   Precautions  Fall      Restrictions   Weight Bearing Restrictions  No      Balance Screen   Has the patient fallen in the past 6 months  Yes    How many times?  1    Has the patient had a decrease in activity level because of a fear of falling?   No    Is the patient reluctant to leave their home because of a fear of falling?   No      Home Environment   Living Environment  Private residence    Living Arrangements  Spouse/significant other    Type of Cane Beds to enter    Entrance Stairs-Number of Steps  2    Entrance Stairs-Rails  Left    Home Layout  Two level    Alternate Level Stairs-Rails  Right    [place] - 2 wheels      Prior Function   Level of Independence  Independent    Vocation  Retired      Associate Professor   Overall Cognitive Status  No family/caregiver present to determine baseline cognitive functioning      Observation/Other Assessments   Other Surveys   Lower Extremity Functional Scale    Lower Extremity Functional Scale   --      Standardized Balance Assessment   Standardized Balance Assessment  Five Times Sit to Stand;10 meter walk test;Berg Balance Test;Timed Up and Go Test    Five times sit to stand comments   13.0s    10 Meter Walk  self selected: 15.2s = 0.66 m/s, fastest: 13.1s = 0.76 m/s      Berg Balance Test   Sit to Stand  Able to stand without using hands and stabilize independently    Standing Unsupported  Able to stand safely 2 minutes     Sitting with Back Unsupported but Feet Supported on Floor or Stool  Able to sit safely and securely 2 minutes    Stand to Sit  Sits safely with minimal use of hands    Transfers  Able to transfer safely, minor use of hands    Standing Unsupported with Eyes Closed  Able to stand 10 seconds safely    Standing Unsupported with Feet Together  Able to place feet together independently and stand 1 minute safely    From Standing, Reach Forward with Outstretched Arm  Can reach forward >12 cm safely (5")    From Standing Position, Pick up Object from Floor  Able to pick up shoe, needs supervision    From Standing Position, Turn to Look Behind Over each Shoulder  Looks behind from both sides and weight shifts well    Turn 360 Degrees  Able to turn 360 degrees safely in 4 seconds or less    Standing Unsupported, Alternately Place Feet on Step/Stool  Able to stand independently and safely and complete 8 steps in 20 seconds    Standing Unsupported, One Foot in Front  Able to plae foot ahead of the other independently and hold 30 seconds    Standing on One Leg  Able to lift leg independently and hold > 10 seconds    Total Score  53      Timed Up and Go Test   TUG  Normal TUG    Normal TUG (seconds)  9.2        TREATMENT  Ther-ex  Interval history obtained from patient; Pt completed LEFS: 33/80 (unbilled); Outcome measures completed with patient including:  29mgait speed: self selected: 15.2s = 0.66 m/s, fastest: 13.1s = 0.76 m/s 5TSTS: 13.0s TUG: 9.2s  BERG: 53/56, >10s SLS on LLE, approximately 4-5s on RLE Strength testing (see below);  Strength R/L 4+/4+ Hip flexion 5/5 Hip external rotation 5/5 Hip internal rotation 3/3+ Hip extension  4-/4 Hip abduction 4/4 Hip adduction 5/5 Knee extension 4+/4+ Knee flexion 5/5 Ankle Dorsiflexion *indicates pain  AROM Hip R/L Flexion: 95*/120 Internal Rotation: 18/40  External Rotation: 45/40 degrees  *indicates pain  Muscle  Length R/L Hamstring length:  60 degrees/50 degrees Positive Thomas test on R;   Attempted R SKTC stretch and then piriformis stretch but both are too painful for pt to continue; Unable to properly perform supine R HS stretch; Hooklying bridges x 10, 3s hold with extensive cues from therapist initially; L sidelying R hip abduction x 10, extensive cues from therapist throughout; Standing R HS stretch on step 45s hold, extensive cues to flex from the hip and not from the low back;   Pt educated throughout session about proper posture and technique with exercises. Improved exercise technique, movement at target joints, use of target muscles after min to mod verbal, visual, tactile cues.    Pt is a pleasant 85year-old male referred for PT to treat his R hip. He came for an evaluation on 02/12/19 but was unable to follow-up due to COVID-19 limitations. Pt presents teoday ambulating with a single point cane with a mildly antalgic gait but improved from initial evaluation almost 3 months ago. He still ambulates with significant R toe out and does have very limited R hip internal rotation at 18 degrees compared to 40 degrees on the left. His R hip flexion is also limited around 95 degrees and is painful at end range compared  to 120 degrees of painless L hip flexion. He continues to demonstrate weakness in his R hip most notable with 3/5 extension and 4-/5 abduction. 5TSTS is improved from initial evaluation however gait speed is slower today. He scored 53/56 on the BERG however is significantly more limited in single balance on his RLE compared to his L. He reports intermittent sharp anterior R groin pain and posterior R hip "pressure pain."  Pt will benefit from PT services to address deficits in strength, mobility, and pain in order to return to full function at home with less R hip pain.             PT Short Term Goals - 05/05/19 0926      PT SHORT TERM GOAL #1   Title  Pt will be independent  with HEP in order to decrease ankle pain and increase strength in order to improve pain-free function at home and work.    Time  6    Period  Weeks    Status  On-going    Target Date  06/16/19        PT Long Term Goals - 05/05/19 0927      PT LONG TERM GOAL #1   Title  Pt will increase LEFS by at least 9 points in order to demonstrate significant improvement in lower extremity function.      Baseline  02/12/19: 58/80; 05/05/19: 33/80    Time  12    Period  Weeks    Status  On-going    Target Date  07/28/19      PT LONG TERM GOAL #2   Title  Pt will decrease worst R hip pain as reported on NPRS by at least 3 points in order to demonstrate clinically significant reduction in ankle/foot pain.     Baseline  02/12/19: worst: 4/10; 05/05/19: 2/10;    Time  12    Period  Weeks    Status  Partially Met    Target Date  07/28/19      PT LONG TERM GOAL #3   Title  Pt will increase strength of R hip extension and abduction by at least 1/2 MMT grade in order to demonstrate improvement in strength and function    Baseline  02/12/19: R hip extension 3/5, R hip abduction: 4-/5; 05/05/19: unchanged;    Time  12    Period  Weeks    Status  On-going    Target Date  07/28/19            Plan - 05/05/19 0926    Clinical Impression Statement  Pt is a pleasant 85 year-old male referred for PT to treat his R hip. He came for an evaluation on 02/12/19 but was unable to follow-up due to COVID-19 limitations. Pt presents teoday ambulating with a single point cane with a mildly antalgic gait but improved from initial evaluation almost 3 months ago. He still ambulates with significant R toe out and does have very limited R hip internal rotation at 18 degrees compared to 40 degrees on the left. His R hip flexion is also limited around 95 degrees and is painful at end range compared to 120 degrees of painless L hip flexion. He continues to demonstrate weakness in his R hip most notable with 3/5 extension and 4-/5  abduction. 5TSTS is improved from initial evaluation however gait speed is slower today. He scored 53/56 on the BERG however is significantly more limited in single balance on his RLE  compared to his L. He reports intermittent sharp anterior R groin pain and posterior R hip "pressure pain."  Pt will benefit from PT services to address deficits in strength, mobility, and pain in order to return to full function at home with less R hip pain.     Personal Factors and Comorbidities  Age;Comorbidity 1;Comorbidity 2;Comorbidity 3+    Comorbidities  CVA, CAD, HTN    Examination-Activity Limitations  Bend;Transfers    Examination-Participation Restrictions  Community Activity    Stability/Clinical Decision Making  Evolving/Moderate complexity    Rehab Potential  Good    PT Frequency  2x / week    PT Duration  12 weeks    PT Treatment/Interventions  ADLs/Self Care Home Management;Aquatic Therapy;Biofeedback;Canalith Repostioning;Cryotherapy;Electrical Stimulation;Iontophoresis 48m/ml Dexamethasone;Moist Heat;Traction;Ultrasound;DME Instruction;Gait training;Stair training;Functional mobility training;Therapeutic activities;Therapeutic exercise;Balance training;Neuromuscular re-education;Patient/family education;Manual techniques;Passive range of motion;Dry needling;Vestibular    PT Next Visit Plan  review HEP, progress strength and balance    PT Home Exercise Plan  Medbridge Access Code: [email or web address] (bridges, SL hip abduction, standing HS stretch)    Consulted and Agree with Plan of Care  Patient       Patient will benefit from skilled therapeutic intervention in order to improve the following deficits and impairments:  Abnormal gait, Decreased balance, Difficulty walking, Decreased strength  Visit Diagnosis: Muscle weakness (generalized)  Pain in right hip  Other abnormalities of gait and mobility     Problem List Patient Active Problem List   Diagnosis Date Noted  . Stenosis of right carotid  artery 02/01/2017  . Acute [place]olic stroke ([place] 083/15/1761 . Bradycardia 11/10/2015  . Essential tremor 06/03/2015  . Dizziness 08/21/2012  . Syncope 04/10/2011  . CALF PAIN, LEFT 10/11/2010  . DYSPEPSIA 07/31/2010  . CHEST DISCOMFORT 07/31/2010  . Hyperlipidemia 05/16/2010  . HYPERTENSION, BENIGN 05/16/2010  . CAD, NATIVE VESSEL 05/16/2010   JPhillips GroutPT, DPT, GCS  Sue Mcalexander 05/05/2019, 10:46 AM  [place]MAIN RPearland [facility] LtdSERVICES [address] [address]Phone: [phone]  Fax:  [phone] Name: GJusitn SalsgiverBDepartment Of [facility] MRN: [id number] of Birth: 111/22/1934

## 2019-05-05 NOTE — Patient Instructions (Signed)
Access Code: [email or web address]  URL: [email or web address]  Date: 05/05/2019  Prepared by: Ria Comment   Exercises Supine Bridge - 10 reps - 2 sets - 3 seconds hold - 1x daily - 7x weekly Sidelying Hip Abduction - 10 reps - 2 sets - 3 seconds hold - 1x daily - 7x weekly Standing Hamstring Stretch with Step - 3 reps - 45 seconds hold - 1x daily - 7x weekly

## 2019-05-07 ENCOUNTER — Ambulatory Visit: Payer: Medicare Other

## 2019-05-07 ENCOUNTER — Other Ambulatory Visit: Payer: Self-pay

## 2019-05-07 DIAGNOSIS — M6281 Muscle weakness (generalized): Secondary | ICD-10-CM

## 2019-05-07 DIAGNOSIS — M25551 Pain in right hip: Secondary | ICD-10-CM

## 2019-05-07 DIAGNOSIS — R2689 Other abnormalities of gait and mobility: Secondary | ICD-10-CM

## 2019-05-07 NOTE — Therapy (Signed)
[place] MAIN [facility] SERVICES [address] [place], [place], [address] Phone: [phone]   Fax:  [phone]  Physical Therapy Treatment  Patient Details  Name: [name]. MRN: [id number] Date of Birth: 1933-01-30 Referring Provider (PT): Dr. Marry Guan   Encounter Date: 05/07/2019  PT End of Session - 05/07/19 1012    Visit Number  3    Number of Visits  31    Date for PT Re-Evaluation  07/28/19    PT Start Time  0928    PT Stop Time  1015    PT Time Calculation (min)  47 min    Equipment Utilized During Treatment  Gait belt    Activity Tolerance  Patient tolerated treatment well    Behavior During Therapy  WFL for tasks assessed/performed       Past Medical History:  Diagnosis Date  . Acute embolic stroke ([place]) 12/04/9415  . BPH (benign prostatic hyperplasia)   . Bradycardia 11/10/2015  . Coronary artery disease   . Dizziness 08/21/2012  . DYSPEPSIA 07/31/2010   Qualifier: Diagnosis of  By: [name]    . Hyperlipidemia 05/16/2010   Qualifier: Diagnosis of  By: [name], [name]     . Hypertension   . HYPERTENSION, BENIGN 05/16/2010   Qualifier: Diagnosis of  By: [name], [name]    . Stenosis of right carotid artery 02/01/2017    Past Surgical History:  Procedure Laterality Date  . HIP SURGERY     right hip   . Intestinal Blockage    . LOOP RECORDER INSERTION N/A 05/14/2017   Procedure: Loop Recorder Insertion;  Surgeon: [name], MD;  Location: [place] CV [facility];  Service: Cardiovascular;  Laterality: N/A;  . SKIN CANCER EXCISION     x2  . TONSILLECTOMY      There were no vitals filed for this visit.  Subjective Assessment - 05/07/19 0931    Subjective  Patient reports he is seeing his ortho today about his hip. Hasn't been doing his HEP, has been working and exercising though.     Pertinent History  The patient is a 85 y.o. male who was referred for physical therapy to treat his right hip. He is  over 3 months status post percutaneous pinning of a right femoral neck fracture performed by [name] in [place]. Fall occurred on 10/25/18. After the surgery he went to SNF for 1 week and then discharged to his daughters house for 6 weeks. After returning home to [place] he started PT at an outpatient clinic in [place] and then transferred to [facility]. He was working with physical therapy at [facility] for "multiple weeks." He had scheduling issues with [facility] so he is here at [facility] for an evaluation. He is currently using a single point cane for ambulation for the last month. He would like to transfer off of his cane to no assistive device. He has been weightbearing as tolerated for some time.  He denies any significant resting groin or thigh pain upon arrival but does have some intermittent anterior and posterior hip pain on occasion. History of CVA 2 years ago but pt denies any residual deficits. Otherwise he denies any specific questions or concerns. Lives with wife in multilevel house but can stay on main floor, 2 steps to enter with L railing. No reported trouble with stairs. Independent with ADLs/IADLs and drives.    Limitations  Walking    Patient Stated  Goals  "I want to walk without a cane." Pt would like to improve his strength, gait, and balance    Currently in Pain?  No/denies       Treatment: TherEx: cueing for sequencing, muscle activation and positioning in space with CGA in standing position.   Supine: AAROM L hip: Flexion with knee bent 10x 15 second holds, abduction 10x 15 second holds, IR/ER rocking 60 seconds. No pain reported.   SLR: excessive ER requiring max cueing for neutral position  Bridges 15x, cueing for gluteal activation   hooklying adduction squeezes 10x 5 second holds   10x STS with focus on weight shift onto RLE 10x STS with green foam under LLE to promote weight shift to RLE   Modified single limb stance with yellow disc under L foot  3x 45 seconds, last set 60 seconds with focus on weight acceptance and shift on RLE  modified lunges bosu ball 12x each LE occasional UE support cueing for R foot neutral position  modified side lunge bosu ball 12x each LE, occasional UE support, more unsteadiness and limited ability to correct foot position without assistance  Toy soldiers 15x ; challenged with sequencing of hip flexion then knee extension them straight leg extension for full motion max verbal cueing.   Airex pad: horizontal head turns 90 seconds , vertical head turns 90 seconds.   Seated hamstring stretch 30 seconds each LE.                    PT Education - 05/07/19 1006    Education Details  exercise technique, stability    Person(s) Educated  Patient    Methods  Explanation;Demonstration;Tactile cues;Verbal cues    Comprehension  Verbalized understanding;Returned demonstration;Verbal cues required;Tactile cues required;Need further instruction       PT Short Term Goals - 05/05/19 0926      PT SHORT TERM GOAL #1   Title  Pt will be independent with HEP in order to decrease ankle pain and increase strength in order to improve pain-free function at home and work.    Time  6    Period  Weeks    Status  On-going    Target Date  06/16/19        PT Long Term Goals - 05/05/19 0927      PT LONG TERM GOAL #1   Title  Pt will increase LEFS by at least 9 points in order to demonstrate significant improvement in lower extremity function.      Baseline  02/12/19: 58/80; 05/05/19: 33/80    Time  12    Period  Weeks    Status  On-going    Target Date  07/28/19      PT LONG TERM GOAL #2   Title  Pt will decrease worst R hip pain as reported on NPRS by at least 3 points in order to demonstrate clinically significant reduction in ankle/foot pain.     Baseline  02/12/19: worst: 4/10; 05/05/19: 2/10;    Time  12    Period  Weeks    Status  Partially Met    Target Date  07/28/19      PT LONG TERM GOAL #3    Title  Pt will increase strength of R hip extension and abduction by at least 1/2 MMT grade in order to demonstrate improvement in strength and function    Baseline  02/12/19: R hip extension 3/5, R hip abduction: 4-/5; 05/05/19: unchanged;  Time  12    Period  Weeks    Status  On-going    Target Date  07/28/19            Plan - 05/07/19 1022    Clinical Impression Statement  Patient presents with good motivation. He requires frequent cueing for neutral foot position of RLE and is challenged with re-positioning due to preference for excessive external rotation. A focus on increasing weight acceptance onto RLE performed with patient requiring external aide with limited ability to self correct at this time. Pt will benefit from PT services to address deficits in strength, mobility, and pain in order to return to full function at home with less R hip pain.    Personal Factors and Comorbidities  Age;Comorbidity 1;Comorbidity 2;Comorbidity 3+    Comorbidities  CVA, CAD, HTN    Examination-Activity Limitations  Bend;Transfers    Examination-Participation Restrictions  Community Activity    Stability/Clinical Decision Making  Evolving/Moderate complexity    Rehab Potential  Good    PT Frequency  2x / week    PT Duration  12 weeks    PT Treatment/Interventions  ADLs/Self Care Home Management;Aquatic Therapy;Biofeedback;Canalith Repostioning;Cryotherapy;Electrical Stimulation;Iontophoresis 24m/ml Dexamethasone;Moist Heat;Traction;Ultrasound;DME Instruction;Gait training;Stair training;Functional mobility training;Therapeutic activities;Therapeutic exercise;Balance training;Neuromuscular re-education;Patient/family education;Manual techniques;Passive range of motion;Dry needling;Vestibular    PT Next Visit Plan  review HEP, progress strength and balance    PT Home Exercise Plan  Medbridge Access Code: [email or web address] (bridges, SL hip abduction, standing HS stretch)    Consulted and Agree with Plan of  Care  Patient       Patient will benefit from skilled therapeutic intervention in order to improve the following deficits and impairments:  Abnormal gait, Decreased balance, Difficulty walking, Decreased strength  Visit Diagnosis: Muscle weakness (generalized)  Pain in right hip  Other abnormalities of gait and mobility     Problem List Patient Active Problem List   Diagnosis Date Noted  . Stenosis of right carotid artery 02/01/2017  . Acute [place]olic stroke ([place] 003/50/0938 . Bradycardia 11/10/2015  . Essential tremor 06/03/2015  . Dizziness 08/21/2012  . Syncope 04/10/2011  . CALF PAIN, LEFT 10/11/2010  . DYSPEPSIA 07/31/2010  . CHEST DISCOMFORT 07/31/2010  . Hyperlipidemia 05/16/2010  . HYPERTENSION, BENIGN 05/16/2010  . CAD, NATIVE VESSEL 05/16/2010   MJanna Arch PT, DPT   05/07/2019, 10:25 AM  CC[place]MAIN RMerit Health CentralSERVICES [address]RMosier [address] [address]Phone: [phone]  Fax:  [phone] Name: Calvin MudryBBlack River Ambulatory Surgery Center MRN: [id number] of Birth: 122-Oct-1934

## 2019-05-10 DIAGNOSIS — Z77098 Contact with and (suspected) exposure to other hazardous, chiefly nonmedicinal, chemicals: Secondary | ICD-10-CM | POA: Insufficient documentation

## 2019-05-10 DIAGNOSIS — I639 Cerebral infarction, unspecified: Secondary | ICD-10-CM | POA: Insufficient documentation

## 2019-05-10 DIAGNOSIS — G589 Mononeuropathy, unspecified: Secondary | ICD-10-CM | POA: Insufficient documentation

## 2019-05-12 ENCOUNTER — Ambulatory Visit: Payer: Medicare Other

## 2019-05-12 ENCOUNTER — Other Ambulatory Visit: Payer: Self-pay

## 2019-05-12 DIAGNOSIS — M6281 Muscle weakness (generalized): Secondary | ICD-10-CM

## 2019-05-12 DIAGNOSIS — R2689 Other abnormalities of gait and mobility: Secondary | ICD-10-CM

## 2019-05-12 NOTE — Therapy (Signed)
[place] MAIN [facility] SERVICES [address] [place], [place], [address] Phone: [phone]   Fax:  [phone]  Physical Therapy Treatment  Patient Details  Name: [name]. MRN: [id number] Date of Birth: 03-04-1933 Referring Provider (PT): Dr. Marry Guan   Encounter Date: 05/12/2019  PT End of Session - 05/12/19 0935    Visit Number  4    Number of Visits  81    Date for PT Re-Evaluation  07/28/19    PT Start Time  0930    PT Stop Time  1015    PT Time Calculation (min)  45 min    Equipment Utilized During Treatment  Gait belt    Activity Tolerance  Patient tolerated treatment well    Behavior During Therapy  WFL for tasks assessed/performed       Past Medical History:  Diagnosis Date  . Acute embolic stroke ([place]) 08/12/3715  . BPH (benign prostatic hyperplasia)   . Bradycardia 11/10/2015  . Coronary artery disease   . Dizziness 08/21/2012  . DYSPEPSIA 07/31/2010   Qualifier: Diagnosis of  By: [name]    . Hyperlipidemia 05/16/2010   Qualifier: Diagnosis of  By: [name], [name]     . Hypertension   . HYPERTENSION, BENIGN 05/16/2010   Qualifier: Diagnosis of  By: [name], [name]    . Stenosis of right carotid artery 02/01/2017    Past Surgical History:  Procedure Laterality Date  . HIP SURGERY     right hip   . Intestinal Blockage    . LOOP RECORDER INSERTION N/A 05/14/2017   Procedure: Loop Recorder Insertion;  Surgeon: [name], MD;  Location: [facility];  Service: Cardiovascular;  Laterality: N/A;  . SKIN CANCER EXCISION     x2  . TONSILLECTOMY      There were no vitals filed for this visit.  Subjective Assessment - 05/12/19 0933    Subjective  Pt reports that he is doing well today. He saw his ortho last week who discharged him from care and was pleased with all his imaging. He reports 5/10 chronic low back pain with walking upon arrival but no pain in sitting. No specific questions or  concerns at this time.     Pertinent History  The patient is a 85 y.o. male who was referred for physical therapy to treat his right hip. He is over 3 months status post percutaneous pinning of a right femoral neck fracture performed by [name] in [place]. Fall occurred on 10/25/18. After the surgery he went to SNF for 1 week and then discharged to his daughters house for 6 weeks. After returning home to [place] he started PT at an outpatient clinic in [place] and then transferred to [facility]. He was working with physical therapy at [facility] for "multiple weeks." He had scheduling issues with [facility] so he is here at [facility] for an evaluation. He is currently using a single point cane for ambulation for the last month. He would like to transfer off of his cane to no assistive device. He has been weightbearing as tolerated for some time.  He denies any significant resting groin or thigh pain upon arrival but does have some intermittent anterior and posterior hip pain on occasion. History of CVA 2 years ago but pt denies any residual deficits. Otherwise he denies any specific questions or concerns. Lives with wife in multilevel house but can stay on main floor,  2 steps to enter with L railing. No reported trouble with stairs. Independent with ADLs/IADLs and drives.    Limitations  Walking    Patient Stated Goals  "I want to walk without a cane." Pt would like to improve his strength, gait, and balance    Currently in Pain?  Yes    Pain Score  5     Pain Location  Back    Pain Orientation  Lower    Pain Descriptors / Indicators  Aching    Pain Type  Chronic pain    Pain Onset  More than a month ago    Pain Frequency  Intermittent         TREATMENT   Ther-ex  NuStep L1-3 x 5 minutes for warm-up during history (3 minutes unbilled); Quantum R single press 60# x 20, 75# x 20; STS x 10 without UE support with green foam under LLE to promote weight shift to RLE; Standing  hip flexion marching with 2.5# ankle weights (AW) x 20 bilateral; Standing hip abduction with 2.5# AW x 20 bilateral; Standing hip extension with 2.5# AW x 20 bilateral; Standing HS curls with 2.5# AW x 20 bilateral; Seated R hamstring step stretch 30s hold x 3; R forward lunges bosu ball x 12 each LE occasional UE support cueing for R foot neutral position; R side lunge bosu ball x 12, occasional UE support, more unsteadiness; Step-ups to BOSU leading with RLE and down with LLE x 10;   Pt educated throughout session about proper posture and technique with exercises. Improved exercise technique, movement at target joints, use of target muscles after min to mod verbal, visual, tactile cues.    Pt demonstrates good motivation during session and minimal fatigue. He reports intermittent R knee and hip discomfort during session but generally attempted to avoid any uncomfortable exercises. Initiated R single leg press and standing hip 4 ways. Pt demonstrates instability in single leg stance on RLE. Pt will benefit from PT services to address deficits in strength, balance, and mobility in order to return to full function at home.                                PT Short Term Goals - 05/05/19 0926      PT SHORT TERM GOAL #1   Title  Pt will be independent with HEP in order to decrease ankle pain and increase strength in order to improve pain-free function at home and work.    Time  6    Period  Weeks    Status  On-going    Target Date  06/16/19        PT Long Term Goals - 05/05/19 0927      PT LONG TERM GOAL #1   Title  Pt will increase LEFS by at least 9 points in order to demonstrate significant improvement in lower extremity function.      Baseline  02/12/19: 58/80; 05/05/19: 33/80    Time  12    Period  Weeks    Status  On-going    Target Date  07/28/19      PT LONG TERM GOAL #2   Title  Pt will decrease worst R hip pain as reported on NPRS by at least 3  points in order to demonstrate clinically significant reduction in ankle/foot pain.     Baseline  02/12/19: worst: 4/10; 05/05/19: 2/10;    Time  12    Period  Weeks    Status  Partially Met    Target Date  07/28/19      PT LONG TERM GOAL #3   Title  Pt will increase strength of R hip extension and abduction by at least 1/2 MMT grade in order to demonstrate improvement in strength and function    Baseline  02/12/19: R hip extension 3/5, R hip abduction: 4-/5; 05/05/19: unchanged;    Time  12    Period  Weeks    Status  On-going    Target Date  07/28/19            Plan - 05/12/19 0935    Clinical Impression Statement  Pt demonstrates good motivation during session and minimal fatigue. He reports intermittent R knee and hip discomfort during session but generally attempted to avoid any uncomfortable exercises. Initiated R single leg press and standing hip 4 ways. Pt demonstrates instability in single leg stance on RLE. Pt will benefit from PT services to address deficits in strength, balance, and mobility in order to return to full function at home.     Personal Factors and Comorbidities  Age;Comorbidity 1;Comorbidity 2;Comorbidity 3+    Comorbidities  CVA, CAD, HTN    Examination-Activity Limitations  Bend;Transfers    Examination-Participation Restrictions  Community Activity    Stability/Clinical Decision Making  Evolving/Moderate complexity    Rehab Potential  Good    PT Frequency  2x / week    PT Duration  12 weeks    PT Treatment/Interventions  ADLs/Self Care Home Management;Aquatic Therapy;Biofeedback;Canalith Repostioning;Cryotherapy;Electrical Stimulation;Iontophoresis 68m/ml Dexamethasone;Moist Heat;Traction;Ultrasound;DME Instruction;Gait training;Stair training;Functional mobility training;Therapeutic activities;Therapeutic exercise;Balance training;Neuromuscular re-education;Patient/family education;Manual techniques;Passive range of motion;Dry needling;Vestibular    PT Next  Visit Plan  review HEP, progress strength and balance    PT Home Exercise Plan  Medbridge Access Code: [email or web address] (bridges, SL hip abduction, standing HS stretch)    Consulted and Agree with Plan of Care  Patient       Patient will benefit from skilled therapeutic intervention in order to improve the following deficits and impairments:  Abnormal gait, Decreased balance, Difficulty walking, Decreased strength  Visit Diagnosis: Muscle weakness (generalized)  Other abnormalities of gait and mobility     Problem List Patient Active Problem List   Diagnosis Date Noted  . Stenosis of right carotid artery 02/01/2017  . [place]olic [place] ([place] 016/09/9603 . Bradycardia 11/10/2015  . Essential tremor 06/03/2015  . Dizziness 08/21/2012  . Syncope 04/10/2011  . CALF PAIN, LEFT 10/11/2010  . DYSPEPSIA 07/31/2010  . CHEST DISCOMFORT 07/31/2010  . Hyperlipidemia 05/16/2010  . HYPERTENSION, BENIGN 05/16/2010  . CAD, NATIVE VESSEL 05/16/2010   JPhillips GroutPT, DPT, GCS  Huprich,Jason 05/12/2019, 11:20 AM  [place]MAIN [facility]Min[facility] HospitalSERV[facility] [address] [address]Phone: [phone]  Fax:  [phone] Name: GTreveon BourcierBCincinnati Va Medical Center MRN: [id number] of Birth: [birth year]

## 2019-05-14 ENCOUNTER — Ambulatory Visit: Payer: Medicare Other

## 2019-05-14 ENCOUNTER — Other Ambulatory Visit: Payer: Self-pay

## 2019-05-14 DIAGNOSIS — M25551 Pain in right hip: Secondary | ICD-10-CM

## 2019-05-14 DIAGNOSIS — M6281 Muscle weakness (generalized): Secondary | ICD-10-CM

## 2019-05-14 NOTE — Therapy (Signed)
[place] MAIN [facility] SERVICES [address] [place], [place], [address] Phone: [phone]   Fax:  [phone]  Physical Therapy Treatment  Patient Details  Name: [name]. MRN: [id number] Date of Birth: [birth year] Referring Provider (PT): Dr. Marry Guan   Encounter Date: 05/14/2019  PT End of Session - 05/14/19 0936    Visit Number  5    Number of Visits  49    Date for PT Re-Evaluation  07/28/19    PT Start Time  0931    PT Stop Time  1015    PT Time Calculation (min)  44 min    Equipment Utilized During Treatment  Gait belt    Activity Tolerance  Patient tolerated treatment well    Behavior During Therapy  WFL for tasks assessed/performed       Past Medical History:  Diagnosis Date  . Acute embolic stroke ([place]) 0/05/2693  . BPH (benign prostatic hyperplasia)   . Bradycardia 11/10/2015  . Coronary artery disease   . Dizziness 08/21/2012  . DYSPEPSIA 07/31/2010   Qualifier: Diagnosis of  By: [name]    . Hyperlipidemia 05/16/2010   Qualifier: Diagnosis of  By: [name], [name]     . Hypertension   . HYPERTENSION, BENIGN 05/16/2010   Qualifier: Diagnosis of  By: [name], [name]    . Stenosis of right carotid artery 02/01/2017    Past Surgical History:  Procedure Laterality Date  . HIP SURGERY     right hip   . Intestinal Blockage    . LOOP RECORDER INSERTION N/A 05/14/2017   Procedure: Loop Recorder Insertion;  Surgeon: [name], MD;  Location: [facility];  Service: Cardiovascular;  Laterality: N/A;  . SKIN CANCER EXCISION     x2  . TONSILLECTOMY      There were no vitals filed for this visit.  Subjective Assessment - 05/14/19 0935    Subjective  Pt reports that he is doing well today. No pain reported upon arrival today. No specific questions or concerns at this time.    Pertinent History  The patient is a 85 y.o. male who was referred for physical therapy to treat his right hip. He is  over 3 months status post percutaneous pinning of a right femoral neck fracture performed by [name] in [place]. Fall occurred on 10/25/18. After the surgery he went to SNF for 1 week and then discharged to his daughters house for 6 weeks. After returning home to [place] he started PT at an outpatient clinic in [place] and then transferred to [facility]. He was working with physical therapy at [facility] for "multiple weeks." He had scheduling issues with [facility] so he is here at [facility] for an evaluation. He is currently using a single point cane for ambulation for the last month. He would like to transfer off of his cane to no assistive device. He has been weightbearing as tolerated for some time.  He denies any significant resting groin or thigh pain upon arrival but does have some intermittent anterior and posterior hip pain on occasion. History of CVA 2 years ago but pt denies any residual deficits. Otherwise he denies any specific questions or concerns. Lives with wife in multilevel house but can stay on main floor, 2 steps to enter with L railing. No reported trouble with stairs. Independent with ADLs/IADLs and drives.    Limitations  Walking    Patient Stated Goals  "  I want to walk without a cane." Pt would like to improve his strength, gait, and balance    Currently in Pain?  No/denies           TREATMENT     Ther-ex  Octane HIIT L3 x 1 minute, L6 x 1 minute for 5 minutes total during history (3 minutes unbilled);  Quantum R single press 60# x 20, 75# x 20, L side 75# x 20; Single leg stance on RLE with clockwise/counterclockwise ball rolls under LLE; Tandem balance on  bolster (flat side up) alternating forward LE 30s x 2 each; STS x 10 without UE support with 6" under LLE to promote weight shift to RLE; R forward lunges bosu ball x 12 each LE occasional UE support cueing for R foot neutral position; R side lunge bosu ball x 12, occasional UE support, more  unsteadiness; Step-ups to BOSU leading with RLE and down with LLE x 10; Standing hip flexion marching with 3# ankle weights (AW) x 20 bilateral; Standing hip abduction with 3# AW x 20 bilateral; Standing hip extension with 3# AW x 20 bilateral; Standing HS curls with 3# AW x 20 bilateral;     Pt educated throughout session about proper posture and technique with exercises. Improved exercise technique, movement at target joints, use of target muscles after min to mod verbal, visual, tactile cues.      Pt demonstrates good motivation during session and minimal fatigue. He reports intermittent R knee and hip discomfort during session but generally attempted to avoid any uncomfortable exercises. He is able to increase resistance on leg press today. Pt demonstrates instability in single leg stance on RLE. He will benefit from PT services to address deficits in strength, balance, and mobility in order to return to full function at home.                        PT Short Term Goals - 05/05/19 0926      PT SHORT TERM GOAL #1   Title  Pt will be independent with HEP in order to decrease ankle pain and increase strength in order to improve pain-free function at home and work.    Time  6    Period  Weeks    Status  On-going    Target Date  06/16/19        PT Long Term Goals - 05/05/19 0927      PT LONG TERM GOAL #1   Title  Pt will increase LEFS by at least 9 points in order to demonstrate significant improvement in lower extremity function.      Baseline  02/12/19: 58/80; 05/05/19: 33/80    Time  12    Period  Weeks    Status  On-going    Target Date  07/28/19      PT LONG TERM GOAL #2   Title  Pt will decrease worst R hip pain as reported on NPRS by at least 3 points in order to demonstrate clinically significant reduction in ankle/foot pain.     Baseline  02/12/19: worst: 4/10; 05/05/19: 2/10;    Time  12    Period  Weeks    Status  Partially Met    Target Date  07/28/19       PT LONG TERM GOAL #3   Title  Pt will increase strength of R hip extension and abduction by at least 1/2 MMT grade in order to demonstrate improvement in  strength and function    Baseline  02/12/19: R hip extension 3/5, R hip abduction: 4-/5; 05/05/19: unchanged;    Time  12    Period  Weeks    Status  On-going    Target Date  07/28/19            Plan - 05/14/19 0936    Clinical Impression Statement  Pt demonstrates good motivation during session and minimal fatigue. He reports intermittent R knee and hip discomfort during session but generally attempted to avoid any uncomfortable exercises. He is able to increase resistance on leg press today. Pt demonstrates instability in single leg stance on RLE. He will benefit from PT services to address deficits in strength, balance, and mobility in order to return to full function at home.    Personal Factors and Comorbidities  Age;Comorbidity 1;Comorbidity 2;Comorbidity 3+    Comorbidities  CVA, CAD, HTN    Examination-Activity Limitations  Bend;Transfers    Examination-Participation Restrictions  Community Activity    Stability/Clinical Decision Making  Evolving/Moderate complexity    Rehab Potential  Good    PT Frequency  2x / week    PT Duration  12 weeks    PT Treatment/Interventions  ADLs/Self Care Home Management;Aquatic Therapy;Biofeedback;Canalith Repostioning;Cryotherapy;Electrical Stimulation;Iontophoresis 52m/ml Dexamethasone;Moist Heat;Traction;Ultrasound;DME Instruction;Gait training;Stair training;Functional mobility training;Therapeutic activities;Therapeutic exercise;Balance training;Neuromuscular re-education;Patient/family education;Manual techniques;Passive range of motion;Dry needling;Vestibular    PT Next Visit Plan  review HEP, progress strength and balance    PT Home Exercise Plan  Medbridge Access Code: [email or web address] (bridges, SL hip abduction, standing HS stretch)    Consulted and Agree with Plan of Care  Patient        Patient will benefit from skilled therapeutic intervention in order to improve the following deficits and impairments:  Abnormal gait, Decreased balance, Difficulty walking, Decreased strength  Visit Diagnosis: Muscle weakness (generalized) -  Pain in right hip      Problem List Patient Active Problem List   Diagnosis Date Noted  . Stenosis of right carotid artery 02/01/2017  . Acute [place]olic stroke ([place] 024/81/8590 . Bradycardia 11/10/2015  . Essential tremor 06/03/2015  . Dizziness 08/21/2012  . Syncope 04/10/2011  . CALF PAIN, LEFT 10/11/2010  . DYSPEPSIA 07/31/2010  . CHEST DISCOMFORT 07/31/2010  . Hyperlipidemia 05/16/2010  . HYPERTENSION, BENIGN 05/16/2010  . CAD, NATIVE VESSEL 05/16/2010   JPhillips GroutPT, DPT, GCS  , 05/14/2019, 10:17 AM  CChinese CampMAIN RTrinity Hospital Of AugustaSERVICES [address] [address]Phone: [phone]  Fax:  [phone] Name: Calvin GandolfoBVia Christi Rehabilitation Hospital Inc MRN: [id number] of Birth: [birth year]

## 2019-05-16 ENCOUNTER — Other Ambulatory Visit: Payer: Self-pay | Admitting: Cardiovascular Disease

## 2019-05-19 ENCOUNTER — Ambulatory Visit: Payer: Medicare Other

## 2019-05-19 ENCOUNTER — Other Ambulatory Visit: Payer: Self-pay

## 2019-05-19 DIAGNOSIS — M6281 Muscle weakness (generalized): Secondary | ICD-10-CM

## 2019-05-19 DIAGNOSIS — R2689 Other abnormalities of gait and mobility: Secondary | ICD-10-CM

## 2019-05-19 NOTE — Therapy (Signed)
[place] MAIN [facility] SERVICES [address] [place], [place], [address] Phone: [phone]   Fax:  [phone]  Physical Therapy Treatment  Patient Details  Name: [name]. MRN: [id number] Date of Birth: 1933-06-30 Referring Provider (PT): Dr. Marry Guan   Encounter Date: 05/19/2019  PT End of Session - 05/19/19 0938    Visit Number  6    Number of Visits  61    Date for PT Re-Evaluation  07/28/19    Authorization Type  Eval: 02/16/19 (break following for COVID), reassessment 05/05/19    PT Start Time  0932    PT Stop Time  1015    PT Time Calculation (min)  43 min    Equipment Utilized During Treatment  Gait belt    Activity Tolerance  Patient tolerated treatment well    Behavior During Therapy  WFL for tasks assessed/performed       Past Medical History:  Diagnosis Date  . Acute embolic stroke ([place]) 07/07/276  . BPH (benign prostatic hyperplasia)   . Bradycardia 11/10/2015  . Coronary artery disease   . Dizziness 08/21/2012  . DYSPEPSIA 07/31/2010   Qualifier: Diagnosis of  By: [name]    . Hyperlipidemia 05/16/2010   Qualifier: Diagnosis of  By: [name], [name]     . Hypertension   . HYPERTENSION, BENIGN 05/16/2010   Qualifier: Diagnosis of  By: [name], [name]    . Stenosis of right carotid artery 02/01/2017    Past Surgical History:  Procedure Laterality Date  . HIP SURGERY     right hip   . Intestinal Blockage    . LOOP RECORDER INSERTION N/A 05/14/2017   Procedure: Loop Recorder Insertion;  Surgeon: [name], MD;  Location: [place] [facility];  Service: Cardiovascular;  Laterality: N/A;  . SKIN CANCER EXCISION     x2  . TONSILLECTOMY      There were no vitals filed for this visit.  Subjective Assessment - 05/19/19 0937    Subjective  Pt reports that he is doing well today. No pain reported upon arrival today. No specific questions or concerns at this time.    Pertinent History  The patient  is a 85 y.o. male who was referred for physical therapy to treat his right hip. He is over 3 months status post percutaneous pinning of a right femoral neck fracture performed by [name] in [place]. Fall occurred on 10/25/18. After the surgery he went to SNF for 1 week and then discharged to his daughters house for 6 weeks. After returning home to [place] he started PT at an outpatient clinic in [place] and then transferred to [facility]. He was working with physical therapy at [facility] for "multiple weeks." He had scheduling issues with [facility] so he is here at [facility] for an evaluation. He is currently using a single point cane for ambulation for the last month. He would like to transfer off of his cane to no assistive device. He has been weightbearing as tolerated for some time.  He denies any significant resting groin or thigh pain upon arrival but does have some intermittent anterior and posterior hip pain on occasion. History of CVA 2 years ago but pt denies any residual deficits. Otherwise he denies any specific questions or concerns. Lives with wife in multilevel house but can stay on main floor, 2 steps to enter with L railing. No reported trouble with stairs. Independent with ADLs/IADLs  and drives.    Limitations  Walking    Patient Stated Goals  "I want to walk without a cane." Pt would like to improve his strength, gait, and balance    Currently in Pain?  No/denies           TREATMENT   Ther-ex Octane HIIT L3 x 1 minute, L6 x 1 minute for 5 minutes total during history (3 minutes unbilled); Quantum single press 75# x 20, 90# x 20 bilateral; Forward 6" step ups alternating leading LE x 10 each; Lateral 6" step ups alternating leading LE x 10 each; Cross-over 6" step ups alternating leading LE x 10 each; Sit to stand from regular height chair no UE support with Airex pad under feet x 10, assist last couple reps to keep weight evenly distributed between  legs; Seated R HS step stretch 45s hold x 3, cues required to improve hip hinge and decrease lumbar flexion during stretch; Airex 6" step taps alternating LE x 10 each; Airex feet together head turns horizontal and vertical x 30s each; Seated clams with manual resistance 2 x 15; Seated adductor squeeze with manual resistance 2 x 15; Seated marches with manual resistance 2 x 15; Seated R hip piriformis stretch with knee to opposite shoulder 45s hold x 2;   Pt educated throughout session about proper posture and technique with exercises. Improved exercise technique, movement at target joints, use of target muscles after min to mod verbal, visual, tactile cues.   Pt demonstrates good motivation during session and minimal fatigue. He is able to increase his resistance today on the single leg press and completes all additional exercises without issue. He does struggle slightly with cognitive sequencing during step-ups. He has not been compliant with his HEP so pt encouraged to perform his HEP. He will benefit from PT services to address deficits in strength, balance, and mobility in order to return to full function at home.                       PT Short Term Goals - 05/05/19 0926      PT SHORT TERM GOAL #1   Title  Pt will be independent with HEP in order to decrease ankle pain and increase strength in order to improve pain-free function at home and work.    Time  6    Period  Weeks    Status  On-going    Target Date  06/16/19        PT Long Term Goals - 05/05/19 0927      PT LONG TERM GOAL #1   Title  Pt will increase LEFS by at least 9 points in order to demonstrate significant improvement in lower extremity function.      Baseline  02/12/19: 58/80; 05/05/19: 33/80    Time  12    Period  Weeks    Status  On-going    Target Date  07/28/19      PT LONG TERM GOAL #2   Title  Pt will decrease worst R hip pain as reported on NPRS by at least 3 points in order to  demonstrate clinically significant reduction in ankle/foot pain.     Baseline  02/12/19: worst: 4/10; 05/05/19: 2/10;    Time  12    Period  Weeks    Status  Partially Met    Target Date  07/28/19      PT LONG TERM GOAL #3   Title  Pt will increase strength of R hip extension and abduction by at least 1/2 MMT grade in order to demonstrate improvement in strength and function    Baseline  02/12/19: R hip extension 3/5, R hip abduction: 4-/5; 05/05/19: unchanged;    Time  12    Period  Weeks    Status  On-going    Target Date  07/28/19            Plan - 05/19/19 9791    Clinical Impression Statement  Pt demonstrates good motivation during session and minimal fatigue. He is able to increase his resistance today on the single leg press and completes all additional exercises without issue. He does struggle slightly with cognitive sequencing during step-ups. He has not been compliant with his HEP so pt encouraged to perform his HEP. He will benefit from PT services to address deficits in strength, balance, and mobility in order to return to full function at home.    Personal Factors and Comorbidities  Age;Comorbidity 1;Comorbidity 2;Comorbidity 3+    Comorbidities  CVA, CAD, HTN    Examination-Activity Limitations  Bend;Transfers    Examination-Participation Restrictions  Community Activity    Stability/Clinical Decision Making  Evolving/Moderate complexity    Rehab Potential  Good    PT Frequency  2x / week    PT Duration  12 weeks    PT Treatment/Interventions  ADLs/Self Care Home Management;Aquatic Therapy;Biofeedback;Canalith Repostioning;Cryotherapy;Electrical Stimulation;Iontophoresis 52m/ml Dexamethasone;Moist Heat;Traction;Ultrasound;DME Instruction;Gait training;Stair training;Functional mobility training;Therapeutic activities;Therapeutic exercise;Balance training;Neuromuscular re-education;Patient/family education;Manual techniques;Passive range of motion;Dry needling;Vestibular    PT  Next Visit Plan  review HEP, progress strength and balance    PT Home Exercise Plan  Medbridge Access Code: [email or web address] (bridges, SL hip abduction, standing HS stretch)    Consulted and Agree with Plan of Care  Patient       Patient will benefit from skilled therapeutic intervention in order to improve the following deficits and impairments:  Abnormal gait, Decreased balance, Difficulty walking, Decreased strength  Visit Diagnosis: 1. Muscle weakness (generalized)   2. Other abnormalities of gait and mobility        Problem List Patient Active Problem List   Diagnosis Date Noted  . Stenosis of right carotid artery 02/01/2017  . [place]olic stroke [place] 050/41/3643 . Bradycardia 11/10/2015  . Essential tremor 06/03/2015  . Dizziness 08/21/2012  . Syncope 04/10/2011  . CALF PAIN, LEFT 10/11/2010  . DYSPEPSIA 07/31/2010  . CHEST DISCOMFORT 07/31/2010  . Hyperlipidemia 05/16/2010  . HYPERTENSION, BENIGN 05/16/2010  . CAD, NATIVE VESSEL 05/16/2010   JPhillips GroutPT, DPT, GCS  Huprich,Jason 05/19/2019, 10:23 AM  [place] [facility] LPS[facility] [address] [address]Phone: [phone]  Fax:  [phone] Name: Calvin BiltonBHoly Family Memorial Inc MRN: [id number] of Birth: 1July 13, 1934

## 2019-05-20 ENCOUNTER — Other Ambulatory Visit: Payer: Self-pay | Admitting: Cardiovascular Disease

## 2019-05-20 ENCOUNTER — Ambulatory Visit (INDEPENDENT_AMBULATORY_CARE_PROVIDER_SITE_OTHER): Payer: Medicare Other | Admitting: *Deleted

## 2019-05-20 DIAGNOSIS — I639 Cerebral infarction, unspecified: Secondary | ICD-10-CM | POA: Diagnosis not present

## 2019-05-21 ENCOUNTER — Ambulatory Visit: Payer: Medicare Other

## 2019-05-21 ENCOUNTER — Other Ambulatory Visit: Payer: Self-pay

## 2019-05-21 DIAGNOSIS — M25551 Pain in right hip: Secondary | ICD-10-CM

## 2019-05-21 DIAGNOSIS — M6281 Muscle weakness (generalized): Secondary | ICD-10-CM

## 2019-05-21 DIAGNOSIS — R2689 Other abnormalities of gait and mobility: Secondary | ICD-10-CM

## 2019-05-21 LAB — CUP PACEART REMOTE DEVICE CHECK
Date Time Interrogation Session: 20200617161025
Implantable Pulse Generator Implant Date: 20180612

## 2019-05-21 NOTE — Therapy (Signed)
[place] MAIN [facility] SERVICES [address] [place], [place], [address] Phone: [phone]   Fax:  [phone]  Physical Therapy Treatment  Patient Details  Name: [name]. MRN: [id number] Date of Birth: 03/29/1933 Referring Provider (PT): Dr. Marry Guan   Encounter Date: 05/21/2019  PT End of Session - 05/21/19 0926    Visit Number  7    Number of Visits  48    Date for PT Re-Evaluation  07/28/19    Authorization Type  Eval: 02/16/19 (break following for COVID), reassessment 05/05/19    PT Start Time  0929    PT Stop Time  1010    PT Time Calculation (min)  41 min    Equipment Utilized During Treatment  Gait belt    Activity Tolerance  Patient tolerated treatment well    Behavior During Therapy  WFL for tasks assessed/performed       Past Medical History:  Diagnosis Date  . Acute embolic stroke ([place]) 0/0/7622  . BPH (benign prostatic hyperplasia)   . Bradycardia 11/10/2015  . Coronary artery disease   . Dizziness 08/21/2012  . DYSPEPSIA 07/31/2010   Qualifier: Diagnosis of  By: [name]    . Hyperlipidemia 05/16/2010   Qualifier: Diagnosis of  By: [name], [name]     . Hypertension   . HYPERTENSION, BENIGN 05/16/2010   Qualifier: Diagnosis of  By: [name], [name]    . Stenosis of right carotid artery 02/01/2017    Past Surgical History:  Procedure Laterality Date  . HIP SURGERY     right hip   . Intestinal Blockage    . LOOP RECORDER INSERTION N/A 05/14/2017   Procedure: Loop Recorder Insertion;  Surgeon: [name], MD;  Location: [place] [facility];  Service: Cardiovascular;  Laterality: N/A;  . SKIN CANCER EXCISION     x2  . TONSILLECTOMY      There were no vitals filed for this visit.  Subjective Assessment - 05/21/19 0925    Subjective  Patient reported that he is doing wel today, no complaints after last PT session. Stated while he is sitting on the octane his back does not hurt but with  movement it does.    Pertinent History  The patient is a 85 y.o. male who was referred for physical therapy to treat his right hip. He is over 3 months status post percutaneous pinning of a right femoral neck fracture performed by [name] in [place]. Fall occurred on 10/25/18. After the surgery he went to SNF for 1 week and then discharged to his daughters house for 6 weeks. After returning home to [place] he started PT at an outpatient clinic in [place] and then transferred to [facility]. He was working with physical therapy at [facility] for "multiple weeks." He had scheduling issues with [facility] so he is here at [facility] for an evaluation. He is currently using a single point cane for ambulation for the last month. He would like to transfer off of his cane to no assistive device. He has been weightbearing as tolerated for some time.  He denies any significant resting groin or thigh pain upon arrival but does have some intermittent anterior and posterior hip pain on occasion. History of CVA 2 years ago but pt denies any residual deficits. Otherwise he denies any specific questions or concerns. Lives with wife in multilevel house but can stay on main floor, 2 steps to enter with  L railing. No reported trouble with stairs. Independent with ADLs/IADLs and drives.    Limitations  Walking    Patient Stated Goals  "I want to walk without a cane." Pt would like to improve his strength, gait, and balance    Currently in Pain?  Yes    Pain Score  2    with movement   Pain Location  Back    Pain Descriptors / Indicators  Aching;Constant    Pain Type  Chronic pain    Pain Onset  More than a month ago       Ther-ex  Octane HIIT L3 x 1 minute, L6 x 1 minute for 5 minutes total during history (3 minutes unbilled);  Quantum single press 75# x 20, 150# x 20 bilateral; Heel raises 45# x10 to learn form x10 to perform, verbal and visual cues need  Forward 6" step ups alternating leading LE x  10 each; Lateral 6" step ups alternating leading LE x 10 each; Cross-over 6" step ups alternating leading LE x 10 each; Sit to stand from regular height chair no UE support with Airex pad under feet x 10, cues for eccentric control Seated R HS step stretch 45s hold x 3, cues required to improve hip hinge and decrease lumbar flexion during stretch; Airex 6" step taps alternating LE x 10 each; airex 6" step taps laterally LE x15 ea Airex feet together head turns horizontal and vertical 2 x 30s each;      Pt educated throughout session about proper posture and technique with exercises. Improved exercise technique, movement at target joints, use of target muscles after mod verbal, visual, tactile cues.     Pt response/clinical impression: Patient remained highly motivated during session. Demonstrated good progress with strengthening exercises today. Pt most challenged by uneven surfaces and narrow base of support during balance exercises, 2-3 LOB (posteriorly) noted needed modA from PT to correct. During increased repetitions of exercises pt tends to lose track of exercise form/technique. Pt reported fatigue at end of session. Pt needed verbal and visual cues to re-direct to original task. the patient would  benefit from continued PT services to address deficits in strength, balance, and mobility.     PT Education - 05/21/19 0925    Education Details  exercise technique/form    Person(s) Educated  Patient    Methods  Explanation;Demonstration;Tactile cues;Verbal cues    Comprehension  Verbalized understanding;Returned demonstration;Verbal cues required;Tactile cues required;Need further instruction       PT Short Term Goals - 05/05/19 0926      PT SHORT TERM GOAL #1   Title  Pt will be independent with HEP in order to decrease ankle pain and increase strength in order to improve pain-free function at home and work.    Time  6    Period  Weeks    Status  On-going    Target Date  06/16/19         PT Long Term Goals - 05/05/19 0927      PT LONG TERM GOAL #1   Title  Pt will increase LEFS by at least 9 points in order to demonstrate significant improvement in lower extremity function.      Baseline  02/12/19: 58/80; 05/05/19: 33/80    Time  12    Period  Weeks    Status  On-going    Target Date  07/28/19      PT LONG TERM GOAL #2   Title  Pt will  decrease worst R hip pain as reported on NPRS by at least 3 points in order to demonstrate clinically significant reduction in ankle/foot pain.     Baseline  02/12/19: worst: 4/10; 05/05/19: 2/10;    Time  12    Period  Weeks    Status  Partially Met    Target Date  07/28/19      PT LONG TERM GOAL #3   Title  Pt will increase strength of R hip extension and abduction by at least 1/2 MMT grade in order to demonstrate improvement in strength and function    Baseline  02/12/19: R hip extension 3/5, R hip abduction: 4-/5; 05/05/19: unchanged;    Time  12    Period  Weeks    Status  On-going    Target Date  07/28/19            Plan - 05/21/19 0925    Clinical Impression Statement  Patient remained highly motivated during session. Demonstrated good progress with strengthening exercises today. Pt most challenged by uneven surfaces and narrow base of support during balance exercises, 2-3 LOB (posteriorly) noted needed modA from PT to correct. During increased repetitions of exercises pt tends to lose track of exercise form/technique. Pt reported fatigue at end of session. Pt needed verbal and visual cues to re-direct to original task. the patient would  benefit from continued PT services to address deficits in strength, balance, and mobility.    Personal Factors and Comorbidities  Age;Comorbidity 1;Comorbidity 2;Comorbidity 3+    Comorbidities  CVA, CAD, HTN    Examination-Activity Limitations  Bend;Transfers    Examination-Participation Restrictions  Community Activity    Rehab Potential  Good    PT Frequency  2x / week    PT  Duration  12 weeks    PT Treatment/Interventions  ADLs/Self Care Home Management;Aquatic Therapy;Biofeedback;Canalith Repostioning;Cryotherapy;Electrical Stimulation;Iontophoresis 63m/ml Dexamethasone;Moist Heat;Traction;Ultrasound;DME Instruction;Gait training;Stair training;Functional mobility training;Therapeutic activities;Therapeutic exercise;Balance training;Neuromuscular re-education;Patient/family education;Manual techniques;Passive range of motion;Dry needling;Vestibular    PT Next Visit Plan  review HEP, progress strength and balance    PT Home Exercise Plan  Medbridge Access Code: [email or web address] (bridges, SL hip abduction, standing HS stretch)    Consulted and Agree with Plan of Care  Patient       Patient will benefit from skilled therapeutic intervention in order to improve the following deficits and impairments:  Abnormal gait, Decreased balance, Difficulty walking, Decreased strength  Visit Diagnosis: 1. Muscle weakness (generalized)   2. Other abnormalities of gait and mobility   3. Pain in right hip        Problem List Patient Active Problem List   Diagnosis Date Noted  . Stenosis of right carotid artery 02/01/2017  . Acute [place]olic stroke ([place] 057/12/7791 . Bradycardia 11/10/2015  . Essential tremor 06/03/2015  . Dizziness 08/21/2012  . Syncope 04/10/2011  . CALF PAIN, LEFT 10/11/2010  . DYSPEPSIA 07/31/2010  . CHEST DISCOMFORT 07/31/2010  . Hyperlipidemia 05/16/2010  . HYPERTENSION, BENIGN 05/16/2010  . CAD, NATIVE VESSEL 05/16/2010    DLieutenant DiegoPT, DPT 10:19 AM,05/21/19 3Whis[place]inesMAIN RDesert Park[facility] LLCSERVICES [address] [place] [address]Phone: [phone]  Fax:  [phone] Name: GSavior HimebaughBJennings [facility] Hospital MRN: [id number] of Birth: [birth year]

## 2019-05-26 ENCOUNTER — Ambulatory Visit: Payer: Medicare Other

## 2019-05-26 ENCOUNTER — Other Ambulatory Visit: Payer: Self-pay

## 2019-05-26 DIAGNOSIS — M6281 Muscle weakness (generalized): Secondary | ICD-10-CM

## 2019-05-26 DIAGNOSIS — R2689 Other abnormalities of gait and mobility: Secondary | ICD-10-CM

## 2019-05-26 NOTE — Therapy (Signed)
[place] MAIN [facility] SERVICES [address] [place], [place], [address] Phone: [phone]   Fax:  [phone]  Physical Therapy Treatment  Patient Details  Name: [name]. MRN: [id number] Date of Birth: 1933-09-04 Referring Provider (PT): Dr. Marry Guan   Encounter Date: 05/26/2019  PT End of Session - 05/26/19 0940    Visit Number  8    Number of Visits  4    Date for PT Re-Evaluation  07/28/19    Authorization Type  Eval: 02/16/19 (break following for COVID), reassessment 05/05/19    PT Start Time  0932    PT Stop Time  1017    PT Time Calculation (min)  45 min    Equipment Utilized During Treatment  Gait belt    Activity Tolerance  Patient tolerated treatment well    Behavior During Therapy  WFL for tasks assessed/performed       Past Medical History:  Diagnosis Date  . Acute embolic stroke ([place]) 06/03/5365  . BPH (benign prostatic hyperplasia)   . Bradycardia 11/10/2015  . Coronary artery disease   . Dizziness 08/21/2012  . DYSPEPSIA 07/31/2010   Qualifier: Diagnosis of  By: [name]    . Hyperlipidemia 05/16/2010   Qualifier: Diagnosis of  By: [name], [name]     . Hypertension   . HYPERTENSION, BENIGN 05/16/2010   Qualifier: Diagnosis of  By: [name], [name]    . Stenosis of right carotid artery 02/01/2017    Past Surgical History:  Procedure Laterality Date  . HIP SURGERY     right hip   . Intestinal Blockage    . LOOP RECORDER INSERTION N/A 05/14/2017   Procedure: Loop Recorder Insertion;  Surgeon: [name], MD;  Location: [place] [facility];  Service: Cardiovascular;  Laterality: N/A;  . SKIN CANCER EXCISION     x2  . TONSILLECTOMY      There were no vitals filed for this visit.  Subjective Assessment - 05/26/19 0939    Subjective  Patient reports that he is doing wel today. No complaints after last PT session. Reports 2/10 chronic low back pain upon arrival today. No specific questions or  concerns at this time.    Pertinent History  The patient is a 85 y.o. male who was referred for physical therapy to treat his right hip. He is over 3 months status post percutaneous pinning of a right femoral neck fracture performed by [name] in [place]. Fall occurred on 10/25/18. After the surgery he went to SNF for 1 week and then discharged to his daughters house for 6 weeks. After returning home to [place] he started PT at an outpatient clinic in [place] and then transferred to [facility]. He was working with physical therapy at [facility] for "multiple weeks." He had scheduling issues with [facility] so he is here at [facility] for an evaluation. He is currently using a single point cane for ambulation for the last month. He would like to transfer off of his cane to no assistive device. He has been weightbearing as tolerated for some time.  He denies any significant resting groin or thigh pain upon arrival but does have some intermittent anterior and posterior hip pain on occasion. History of CVA 2 years ago but pt denies any residual deficits. Otherwise he denies any specific questions or concerns. Lives with wife in multilevel house but can stay on main floor, 2 steps to enter with L  railing. No reported trouble with stairs. Independent with ADLs/IADLs and drives.    Limitations  Walking    Patient Stated Goals  "I want to walk without a cane." Pt would like to improve his strength, gait, and balance    Currently in Pain?  Yes    Pain Score  2     Pain Location  Back    Pain Orientation  Lower    Pain Descriptors / Indicators  Aching    Pain Type  Chronic pain    Pain Onset  More than a month ago         TREATMENT   Ther-ex Octane HIIT L4 x 1 minute, L8 x 1 minute for 5 minutes total during history (3 minutes unbilled); Quantum single press 90# x 20, 105# x 12 RLE, x 15 LLE; Seated R HS stretch 45s hold x 3; Attempted seated R hip piriformis stretch with knee to  opposite shoulder as well as figure 4 stretch however both discontinued due to reports of increase in R groin pain; Sit to stand from regular height chair no UE support with Airex pad under RLE feet and step under LLE x 10; Tandem balance on 1/2 foam roll (flat side up) alternating forward LE 30s x 2 each; Hip flexion marching with 4# ankle weights (AW) x 15 bilateral; Hip abduction with 4# AW x 15 bilateral; HS curls with 4# AW x 15 bilateral; Hip extension with 4# AW x 15 bilateral; Heel raises x 15 with UE support, 3s hold at top; Squats with BUE support x 10 in parallel bars;   Pt educated throughout session about proper posture and technique with exercises. Improved exercise technique, movement at target joints, use of target muscles after min to mod verbal, visual, tactile cues.   Pt demonstrates good motivation during session and minimal fatigue. He is unable to perform FADIR or FABER stretches today in sitting due to R hip discomfort. He is able to increase his resistance today on the single leg press and completes all additional exercises without issue. He requires cues for proper form with squats in parallel bars. He has not been compliant with his HEP so pt encouraged to perform his HEP however he remains resistant. He will benefit from PT services to address deficits in strength, balance, and mobility in order to return to full function at home.                          PT Short Term Goals - 05/05/19 0926      PT SHORT TERM GOAL #1   Title  Pt will be independent with HEP in order to decrease ankle pain and increase strength in order to improve pain-free function at home and work.    Time  6    Period  Weeks    Status  On-going    Target Date  06/16/19        PT Long Term Goals - 05/05/19 0927      PT LONG TERM GOAL #1   Title  Pt will increase LEFS by at least 9 points in order to demonstrate significant improvement in lower extremity  function.      Baseline  02/12/19: 58/80; 05/05/19: 33/80    Time  12    Period  Weeks    Status  On-going    Target Date  07/28/19      PT LONG TERM GOAL #2   Title  Pt will decrease worst R hip pain as reported on NPRS by at least 3 points in order to demonstrate clinically significant reduction in ankle/foot pain.     Baseline  02/12/19: worst: 4/10; 05/05/19: 2/10;    Time  12    Period  Weeks    Status  Partially Met    Target Date  07/28/19      PT LONG TERM GOAL #3   Title  Pt will increase strength of R hip extension and abduction by at least 1/2 MMT grade in order to demonstrate improvement in strength and function    Baseline  02/12/19: R hip extension 3/5, R hip abduction: 4-/5; 05/05/19: unchanged;    Time  12    Period  Weeks    Status  On-going    Target Date  07/28/19            Plan - 05/26/19 0941    Clinical Impression Statement  Pt demonstrates good motivation during session and minimal fatigue. He is unable to perform FADIR or FABER stretches today in sitting due to R hip discomfort. He is able to increase his resistance today on the single leg press and completes all additional exercises without issue. He requires cues for proper form with squats in parallel bars. He has not been compliant with his HEP so pt encouraged to perform his HEP however he remains resistant. He will benefit from PT services to address deficits in strength, balance, and mobility in order to return to full function at home.    Personal Factors and Comorbidities  Age;Comorbidity 1;Comorbidity 2;Comorbidity 3+    Comorbidities  CVA, CAD, HTN    Examination-Activity Limitations  Bend;Transfers    Examination-Participation Restrictions  Community Activity    Rehab Potential  Good    PT Frequency  2x / week    PT Duration  12 weeks    PT Treatment/Interventions  ADLs/Self Care Home Management;Aquatic Therapy;Biofeedback;Canalith Repostioning;Cryotherapy;Electrical Stimulation;Iontophoresis 81m/ml  Dexamethasone;Moist Heat;Traction;Ultrasound;DME Instruction;Gait training;Stair training;Functional mobility training;Therapeutic activities;Therapeutic exercise;Balance training;Neuromuscular re-education;Patient/family education;Manual techniques;Passive range of motion;Dry needling;Vestibular    PT Next Visit Plan  review HEP, progress strength and balance    PT Home Exercise Plan  Medbridge Access Code: [email or web address] (bridges, SL hip abduction, standing HS stretch)    Consulted and Agree with Plan of Care  Patient       Patient will benefit from skilled therapeutic intervention in order to improve the following deficits and impairments:  Abnormal gait, Decreased balance, Difficulty walking, Decreased strength  Visit Diagnosis: 1. Muscle weakness (generalized)   2. Other abnormalities of gait and mobility        Problem List Patient Active Problem List   Diagnosis Date Noted  . Stenosis of right carotid artery 02/01/2017  . Acute embolic stroke ([place] 045/36/4680 . Bradycardia 11/10/2015  . Essential tremor 06/03/2015  . Dizziness 08/21/2012  . Syncope 04/10/2011  . CALF PAIN, LEFT 10/11/2010  . DYSPEPSIA 07/31/2010  . CHEST DISCOMFORT 07/31/2010  . Hyperlipidemia 05/16/2010  . HYPERTENSION, BENIGN 05/16/2010  . CAD, NATIVE VESSEL 05/16/2010   JPhillips GroutPT, DPT, GCS  Siren Porrata 05/26/2019, 1:38 PM  CDotyvilleMAIN [facility]unting[facility] HospitalSERVICES [address] [address]Phone: [phone]  Fax:  [phone] Name: GBexley MclesterBGrand Valley Surgical Center LLC MRN: [id number] of Birth: [birth year]

## 2019-05-28 ENCOUNTER — Other Ambulatory Visit: Payer: Self-pay

## 2019-05-28 ENCOUNTER — Ambulatory Visit: Payer: Medicare Other

## 2019-05-28 DIAGNOSIS — M25551 Pain in right hip: Secondary | ICD-10-CM

## 2019-05-28 DIAGNOSIS — M6281 Muscle weakness (generalized): Secondary | ICD-10-CM

## 2019-05-28 DIAGNOSIS — R2689 Other abnormalities of gait and mobility: Secondary | ICD-10-CM

## 2019-05-28 NOTE — Therapy (Signed)
[place] MAIN [facility] SERVICES [address] [place], [place], [address] Phone: [phone]   Fax:  [phone]  Physical Therapy Treatment  Patient Details  Name: [name]. MRN: [id number] Date of Birth: 1933/04/01 Referring Provider (PT): Dr. Marry Guan   Encounter Date: 05/28/2019  PT End of Session - 05/28/19 0937    Visit Number  9    Number of Visits  57    Date for PT Re-Evaluation  07/28/19    Authorization Type  Eval: 02/16/19 (break following for COVID), reassessment 05/05/19    PT Start Time  0935    PT Stop Time  1020    PT Time Calculation (min)  45 min    Equipment Utilized During Treatment  Gait belt    Activity Tolerance  Patient tolerated treatment well    Behavior During Therapy  WFL for tasks assessed/performed       Past Medical History:  Diagnosis Date  . Acute embolic stroke ([place]) 04/07/3148  . BPH (benign prostatic hyperplasia)   . Bradycardia 11/10/2015  . Coronary artery disease   . Dizziness 08/21/2012  . DYSPEPSIA 07/31/2010   Qualifier: Diagnosis of  By: [name]    . Hyperlipidemia 05/16/2010   Qualifier: Diagnosis of  By: [name], [name]     . Hypertension   . HYPERTENSION, BENIGN 05/16/2010   Qualifier: Diagnosis of  By: [name], [name]    . Stenosis of right carotid artery 02/01/2017    Past Surgical History:  Procedure Laterality Date  . HIP SURGERY     right hip   . Intestinal Blockage    . LOOP RECORDER INSERTION N/A 05/14/2017   Procedure: Loop Recorder Insertion;  Surgeon: [name], MD;  Location: [place] [facility];  Service: Cardiovascular;  Laterality: N/A;  . SKIN CANCER EXCISION     x2  . TONSILLECTOMY      There were no vitals filed for this visit.  Subjective Assessment - 05/28/19 1134    Subjective  Patient reports that he is doing wel today. No complaints after last PT session. Reports same 2/10 chronic low back pain upon arrival today. No specific  questions or concerns at this time.    Pertinent History  The patient is a 85 y.o. male who was referred for physical therapy to treat his right hip. He is over 3 months status post percutaneous pinning of a right femoral neck fracture performed by [name] in [place]. Fall occurred on 10/25/18. After the surgery he went to SNF for 1 week and then discharged to his daughters house for 6 weeks. After returning home to [place] he started PT at an outpatient clinic in [place] and then transferred to [facility]. He was working with physical therapy at [facility] for "multiple weeks." He had scheduling issues with [facility] so he is here at [facility] for an evaluation. He is currently using a single point cane for ambulation for the last month. He would like to transfer off of his cane to no assistive device. He has been weightbearing as tolerated for some time.  He denies any significant resting groin or thigh pain upon arrival but does have some intermittent anterior and posterior hip pain on occasion. History of CVA 2 years ago but pt denies any residual deficits. Otherwise he denies any specific questions or concerns. Lives with wife in multilevel house but can stay on main floor, 2 steps to enter with  L railing. No reported trouble with stairs. Independent with ADLs/IADLs and drives.    Limitations  Walking    Patient Stated Goals  "I want to walk without a cane." Pt would like to improve his strength, gait, and balance    Currently in Pain?  Yes    Pain Score  2     Pain Location  Back    Pain Orientation  Lower    Pain Descriptors / Indicators  Aching    Pain Type  Chronic pain    Pain Onset  More than a month ago        TREATMENT   Ther-ex Octane HIIT L4 x 1 minute, L8 x 1 minute for 5 minutes total during history (3 minutes unbilled); Forward lunges onto BOSU (round side up) without UE support alternating LE x 10 each; Step-ups to BOSU (round side up) without UE support  alternating LE x 10 each; Quantum single leg press 95# x 20 BLE, 110# x 10 RLE, x 15 LLE; Seated R HS stretch 45s hold x 3; Sit to stand from regular height chair no UE support with Airex pad under RLE and step under LLE x 10; Tandem balance on 1/2 foam roll (flat side up) alternating forward LE 30s x 2 each; Seated clams with manual resistance x 15; Seated adductor squeeze with manual resistance x 15; Modified HEP and provided to patient along with education;   Pt educated throughout session about proper posture and technique with exercises. Improved exercise technique, movement at target joints, use of target muscles after min to mod verbal, visual, tactile cues.   Pt demonstrates good motivation during session and minimal fatigue.He brought his previous HEP as assigned by therapy as well as the exercises he was provided by the orthopedist. Therapist consolidated these exercises into one program and pt educated. He is able to increase his resistance again on the leg press today. He remains non-compliant with his HEP so pt encouraged to perform his HEP. He will need updated outcome measures, goals, and progress note at next visit. He will benefit from PT services to address deficits in strength, balance, and mobility in order to return to full function at home.                          PT Short Term Goals - 05/05/19 0926      PT SHORT TERM GOAL #1   Title  Pt will be independent with HEP in order to decrease ankle pain and increase strength in order to improve pain-free function at home and work.    Time  6    Period  Weeks    Status  On-going    Target Date  06/16/19        PT Long Term Goals - 05/05/19 0927      PT LONG TERM GOAL #1   Title  Pt will increase LEFS by at least 9 points in order to demonstrate significant improvement in lower extremity function.      Baseline  02/12/19: 58/80; 05/05/19: 33/80    Time  12    Period  Weeks    Status   On-going    Target Date  07/28/19      PT LONG TERM GOAL #2   Title  Pt will decrease worst R hip pain as reported on NPRS by at least 3 points in order to demonstrate clinically significant reduction in ankle/foot pain.  Baseline  02/12/19: worst: 4/10; 05/05/19: 2/10;    Time  12    Period  Weeks    Status  Partially Met    Target Date  07/28/19      PT LONG TERM GOAL #3   Title  Pt will increase strength of R hip extension and abduction by at least 1/2 MMT grade in order to demonstrate improvement in strength and function    Baseline  02/12/19: R hip extension 3/5, R hip abduction: 4-/5; 05/05/19: unchanged;    Time  12    Period  Weeks    Status  On-going    Target Date  07/28/19            Plan - 05/28/19 8616    Clinical Impression Statement  Pt demonstrates good motivation during session and minimal fatigue. He brought his previous HEP as assigned by therapy as well as the exercises he was provided by the orthopedist. Therapist consolidated these exercises into one program and pt educated. He is able to increase his resistance again on the leg press today. He remains non-compliant with his HEP so pt encouraged to perform his HEP. He will need updated outcome measures, goals, and progress note at next visit. He will benefit from PT services to address deficits in strength, balance, and mobility in order to return to full function at home.    Personal Factors and Comorbidities  Age;Comorbidity 1;Comorbidity 2;Comorbidity 3+    Comorbidities  CVA, CAD, HTN    Examination-Activity Limitations  Bend;Transfers    Examination-Participation Restrictions  Community Activity    Rehab Potential  Good    PT Frequency  2x / week    PT Duration  12 weeks    PT Treatment/Interventions  ADLs/Self Care Home Management;Aquatic Therapy;Biofeedback;Canalith Repostioning;Cryotherapy;Electrical Stimulation;Iontophoresis 75m/ml Dexamethasone;Moist Heat;Traction;Ultrasound;DME Instruction;Gait  training;Stair training;Functional mobility training;Therapeutic activities;Therapeutic exercise;Balance training;Neuromuscular re-education;Patient/family education;Manual techniques;Passive range of motion;Dry needling;Vestibular    PT Next Visit Plan  Update outcome measures, goals, progress note; progress strength and balance    PT Home Exercise Plan  Medbridge Access Code: [email or web address]    Consulted and Agree with Plan of Care  Patient       Patient will benefit from skilled therapeutic intervention in order to improve the following deficits and impairments:  Abnormal gait, Decreased balance, Difficulty walking, Decreased strength  Visit Diagnosis: 1. Muscle weakness (generalized)   2. Other abnormalities of gait and mobility   3. Pain in right hip        Problem List Patient Active Problem List   Diagnosis Date Noted  . Stenosis of right carotid artery 02/01/2017  . Acute embolic stroke ([place] 083/72/9021 . Bradycardia 11/10/2015  . Essential tremor 06/03/2015  . Dizziness 08/21/2012  . Syncope 04/10/2011  . CALF PAIN, LEFT 10/11/2010  . DYSPEPSIA 07/31/2010  . CHEST DISCOMFORT 07/31/2010  . Hyperlipidemia 05/16/2010  . HYPERTENSION, BENIGN 05/16/2010  . CAD, NATIVE VESSEL 05/16/2010   JPhillips GroutPT, DPT, GCS  Analese Sovine 05/28/2019, 11:34 AM  CCanton ValleyMAIN RStarr County Memorial HospitalSERVICES [address] [address]Phone: [phone]  Fax:  [phone] Name: GJonathen RathmanBColumbia Eye And Specialty Surgery Center Ltd MRN: [id number] of Birth: 127-Jul-1934

## 2019-05-28 NOTE — Patient Instructions (Addendum)
Access Code: [email or web address]  URL: [email or web address]  Date: 05/28/2019  Prepared by: Roxana Hires   Exercises Supine Single Knee to Chest Stretch - 3 reps - 45 seconds hold - 1x daily - 7x weekly Supine Bridge - 10 reps - 2 sets - 3 seconds hold - 1x daily - 7x weekly Sidelying Hip Abduction - 10 reps - 2 sets - 3 seconds hold - 1x daily - 7x weekly Standing Hamstring Stretch with Step - 3 reps - 45 seconds hold - 1x daily - 7x weekly Mini Squat with Chair - 10 reps - 2 sets - 1x daily - 7x weekly Heel Toe Raises with Counter Support - 10 reps - 2 sets - 3 seconds hold - 1x daily - 7x weekly

## 2019-05-31 NOTE — Progress Notes (Signed)
[facility] Summary Report / Loop Recorder 

## 2019-06-02 ENCOUNTER — Ambulatory Visit: Payer: Medicare Other

## 2019-06-02 ENCOUNTER — Other Ambulatory Visit: Payer: Self-pay

## 2019-06-02 ENCOUNTER — Ambulatory Visit: Payer: Medicare Other | Admitting: Urology

## 2019-06-02 DIAGNOSIS — M25551 Pain in right hip: Secondary | ICD-10-CM

## 2019-06-02 DIAGNOSIS — M6281 Muscle weakness (generalized): Secondary | ICD-10-CM

## 2019-06-02 DIAGNOSIS — R2689 Other abnormalities of gait and mobility: Secondary | ICD-10-CM

## 2019-06-02 NOTE — Therapy (Addendum)
[place] MAIN [facility] SERVICES [address] [place], [place], [address] Phone: [phone]   Fax:  [phone]        Physical Therapy Progress Note   Dates of reporting period  02/16/19   to   06/02/19  Patient Details  Name: [name] [facility]. MRN: [id number] Date of Birth: 1933-10-20 Referring Provider (PT): Dr. Marry Guan   Encounter Date: 06/02/2019  PT End of Session - 06/02/19 0930    Visit Number  10    Number of Visits  49    Date for PT Re-Evaluation  07/28/19    Authorization Type  Eval: 02/16/19 (break following for COVID), reassessment 05/05/19    PT Start Time  0930    PT Stop Time  1015    PT Time Calculation (min)  45 min    Equipment Utilized During Treatment  Gait belt    Activity Tolerance  Patient tolerated treatment well    Behavior During Therapy  WFL for tasks assessed/performed       Past Medical History:  Diagnosis Date  . Acute embolic stroke ([place]) 01/09/7823  . BPH (benign prostatic hyperplasia)   . Bradycardia 11/10/2015  . Coronary artery disease   . Dizziness 08/21/2012  . DYSPEPSIA 07/31/2010   Qualifier: Diagnosis of  By: [name]    . Hyperlipidemia 05/16/2010   Qualifier: Diagnosis of  By: [name], [name]     . Hypertension   . HYPERTENSION, BENIGN 05/16/2010   Qualifier: Diagnosis of  By: [name], [name]    . Stenosis of right carotid artery 02/01/2017    Past Surgical History:  Procedure Laterality Date  . HIP SURGERY     right hip   . Intestinal Blockage    . LOOP RECORDER INSERTION N/A 05/14/2017   Procedure: Loop Recorder Insertion;  Surgeon: [name], MD;  Location: [place] [facility];  Service: Cardiovascular;  Laterality: N/A;  . SKIN CANCER EXCISION     x2  . TONSILLECTOMY      There were no vitals filed for this visit.  Subjective Assessment - 06/02/19 0930    Subjective  Patient reports that he is doing wel today. No complaints after last PT session. Denies any  resting pain upon arrival. No specific questions or concerns at this time.    Pertinent History  The patient is a 85 y.o. male who was referred for physical therapy to treat his right hip. He is over 3 months status post percutaneous pinning of a right femoral neck fracture performed by [name] in [place]. Fall occurred on 10/25/18. After the surgery he went to SNF for 1 week and then discharged to his daughters house for 6 weeks. After returning home to [place] he started PT at an outpatient clinic in [place] and then transferred to [facility]. He was working with physical therapy at [facility] for "multiple weeks." He had scheduling issues with [facility] so he is here at [facility] for an evaluation. He is currently using a single point cane for ambulation for the last month. He would like to transfer off of his cane to no assistive device. He has been weightbearing as tolerated for some time.  He denies any significant resting groin or thigh pain upon arrival but does have some intermittent anterior and posterior hip pain on occasion. History of CVA 2 years ago but pt denies any residual deficits. Otherwise he denies any specific questions or concerns.  Lives with wife in multilevel house but can stay on main floor, 2 steps to enter with L railing. No reported trouble with stairs. Independent with ADLs/IADLs and drives.    Limitations  Walking    Patient Stated Goals  "I want to walk without a cane." Pt would like to improve his strength, gait, and balance    Currently in Pain?  No/denies    Pain Onset  --            TREATMENT   Ther-ex  Interval history obtained from patient; Pt completed LEFS: 46/80 (unbilled); Outcome measures completed with patient including:  4mgait speed: self selected: 11.6s =  0.86 m/s, fastest: 7.9s = 1.27 m/s 5TSTS: 10.0s TUG: 6.4s BERG: 55/56, >10s SLS on LLE, approximately 4-5s on RLE Strength testing (see  below);  Strength R/L 4+/4+Hip flexion 5/5 Hip external rotation 5/5 Hip internal rotation 3/3+Hip extension  4-/4Hip abduction 4/4Hip adduction 5/5 Knee extension 4+/4+Knee flexion 5/5 Ankle Dorsiflexion *indicates pain   Pt educated throughout session about proper posture and technique with exercises. Improved exercise technique, movement at target joints, use of target muscles after min to mod verbal, visual, tactile cues.    Repeated outcome measures with patient today. He demonstrates improvement in his LEFS to 46/80 today compared to 33/80 on 05/05/19. His 132mait speed, 5TSTS, TUG, and BERG have all improved from last testing as well as initial evaluation demonstrating improving LE strength and balance. He remains noncompliant with his HEP because "I am very active at home working on my projects." Emphasis provided to patient today on the importance of specific exercise training for balance and strength deficits. He would like to continue working on his balance and strength in order to further decrease his fall risk. Will perform additional outcome measures at next visit including Six Minute Walk Test and possibly DGI/FGA as appropriate. Once he has successfully achieved his balance/strength goals pt would also like to work on his chronic back pain. At that time will reach out to his PCP and request a new order for his chronic low back pain. Pt will benefit from PT services to address deficits in strength, mobility, and pain in order to return to full function at home with less R hip pain.                          PT Short Term Goals - 06/02/19 0933      PT SHORT TERM GOAL #1   Title  Pt will be independent with HEP in order to decrease hip pain and increase strength in order to improve pain-free function at home and work.    Time  6    Period  Weeks    Status  On-going    Target Date  06/16/19        PT Long Term Goals - 06/02/19 0934      PT  LONG TERM GOAL #1   Title  Pt will increase LEFS by at least 9 points in order to demonstrate significant improvement in lower extremity function.      Baseline  02/12/19: 58/80; 05/05/19: 33/80; 06/03/19: 46/80    Time  12    Period  Weeks    Status  On-going    Target Date  07/28/19      PT LONG TERM GOAL #2   Title  Pt will decrease worst R hip pain as reported on NPRS by at least 3  points in order to demonstrate clinically significant reduction in ankle/foot pain.     Baseline  02/12/19: worst: 4/10; 05/05/19: 2/10; 06/02/19: "1-2/10 maybe once over the last couple weeks"    Time  12    Period  Weeks    Status  Partially Met    Target Date  07/28/19      PT LONG TERM GOAL #3   Title  Pt will increase strength of R hip extension and abduction by at least 1/2 MMT grade in order to demonstrate improvement in strength and function    Baseline  02/12/19: R hip extension 3/5, R hip abduction: 4-/5; 05/05/19: unchanged; 06/03/19: unchanged    Time  12    Period  Weeks    Status  On-going    Target Date  07/28/19      PT LONG TERM GOAL #4   Title  Pt will increase self-selected 10MWT by at least 0.13 m/s in order to demonstrate clinically significant improvement in community ambulation.    Baseline  02/12/19: 0.81 m/s; 05/05/19: 0.66 m/s; 06/03/19: 0.86 m/s    Time  12    Period  Weeks    Status  New    Target Date  07/28/19            Plan - 06/02/19 0930    Clinical Impression Statement  Repeated outcome measures with patient today. He demonstrates improvement in his LEFS to 46/80 today compared to 33/80 on 05/05/19. His 77mgait speed, 5TSTS, TUG, and BERG have all improved from last testing as well as initial evaluation demonstrating improving LE strength and balance. He remains noncompliant with his HEP because "I am very active at home working on my projects." Emphasis provided to patient today on the importance of specific exercise training for balance and strength deficits. He would like to  continue working on his balance and strength in order to further decrease his fall risk. Will perform additional outcome measures at next visit including Six Minute Walk Test and possibly DGI/FGA as appropriate. Once he has successfully achieved his balance/strength goals pt would also like to work on his chronic back pain. At that time will reach out to his PCP and request a new order for his chronic low back pain. Pt will benefit from PT services to address deficits in strength, mobility, and pain in order to return to full function at home with less R hip pain.    Personal Factors and Comorbidities  Age;Comorbidity 1;Comorbidity 2;Comorbidity 3+    Comorbidities  CVA, CAD, HTN    Examination-Activity Limitations  Bend;Transfers    Examination-Participation Restrictions  Community Activity    Rehab Potential  Good    PT Frequency  2x / week    PT Duration  12 weeks    PT Treatment/Interventions  ADLs/Self Care Home Management;Aquatic Therapy;Biofeedback;Canalith Repostioning;Cryotherapy;Electrical Stimulation;Iontophoresis 462mml Dexamethasone;Moist Heat;Traction;Ultrasound;DME Instruction;Gait training;Stair training;Functional mobility training;Therapeutic activities;Therapeutic exercise;Balance training;Neuromuscular re-education;Patient/family education;Manual techniques;Passive range of motion;Dry needling;Vestibular    PT Next Visit Plan  Progress strength and balance, eventually get new order to address back pain    PT Home Exercise Plan  Medbridge Access Code: [email or web address]    Consulted and Agree with Plan of Care  Patient       Patient will benefit from skilled therapeutic intervention in order to improve the following deficits and impairments:  Abnormal gait, Decreased balance, Difficulty walking, Decreased strength  Visit Diagnosis: 1. Muscle weakness (generalized)   2. Other abnormalities of gait and mobility  3. Pain in right hip        Problem List Patient Active Problem List    Diagnosis Date Noted  . Stenosis of right carotid artery 02/01/2017  . Acute embolic stroke ([place]) 65/68/1275  . Bradycardia 11/10/2015  . Essential tremor 06/03/2015  . Dizziness 08/21/2012  . Syncope 04/10/2011  . CALF PAIN, LEFT 10/11/2010  . DYSPEPSIA 07/31/2010  . CHEST DISCOMFORT 07/31/2010  . Hyperlipidemia 05/16/2010  . HYPERTENSION, BENIGN 05/16/2010  . CAD, NATIVE VESSEL 05/16/2010   Phillips Grout PT, DPT, GCS  Rachelann Enloe 06/03/2019, 9:36 AM  [place] MAIN [facility] SERVICES [address] [place], [place], [address] Phone: [phone]   Fax:  [phone]  Name: [name] [facility]. MRN: [id number] Date of Birth: 08-15-1933

## 2019-06-04 ENCOUNTER — Ambulatory Visit: Payer: Medicare Other | Attending: Orthopedic Surgery

## 2019-06-04 ENCOUNTER — Other Ambulatory Visit: Payer: Self-pay

## 2019-06-04 DIAGNOSIS — R2681 Unsteadiness on feet: Secondary | ICD-10-CM | POA: Diagnosis present

## 2019-06-04 DIAGNOSIS — M6281 Muscle weakness (generalized): Secondary | ICD-10-CM | POA: Diagnosis not present

## 2019-06-04 DIAGNOSIS — M25551 Pain in right hip: Secondary | ICD-10-CM | POA: Diagnosis present

## 2019-06-04 DIAGNOSIS — R278 Other lack of coordination: Secondary | ICD-10-CM | POA: Diagnosis present

## 2019-06-04 DIAGNOSIS — R2689 Other abnormalities of gait and mobility: Secondary | ICD-10-CM | POA: Diagnosis present

## 2019-06-04 NOTE — Therapy (Signed)
[place] MAIN [facility] SERVICES [address] [place], [place], [address] Phone: [phone]   Fax:  [phone]  Physical Therapy Treatment  Patient Details  Name: [name]. MRN: [id number] Date of Birth: [birth year] Referring Provider (PT): Dr. Marry Guan   Encounter Date: 06/04/2019  PT End of Session - 06/04/19 0936    Visit Number  11    Number of Visits  74    Date for PT Re-Evaluation  07/28/19    Authorization Type  Eval: 02/16/19 (break following for COVID), reassessment 05/05/19    PT Start Time  0933    PT Stop Time  1018    PT Time Calculation (min)  45 min    Equipment Utilized During Treatment  Gait belt    Activity Tolerance  Patient tolerated treatment well    Behavior During Therapy  WFL for tasks assessed/performed       Past Medical History:  Diagnosis Date  . Acute embolic stroke ([place]) 06/04/4192  . BPH (benign prostatic hyperplasia)   . Bradycardia 11/10/2015  . Coronary artery disease   . Dizziness 08/21/2012  . DYSPEPSIA 07/31/2010   Qualifier: Diagnosis of  By: [name]    . Hyperlipidemia 05/16/2010   Qualifier: Diagnosis of  By: [name], [name]     . Hypertension   . HYPERTENSION, BENIGN 05/16/2010   Qualifier: Diagnosis of  By: [name], [name]    . Stenosis of right carotid artery 02/01/2017    Past Surgical History:  Procedure Laterality Date  . HIP SURGERY     right hip   . Intestinal Blockage    . LOOP RECORDER INSERTION N/A 05/14/2017   Procedure: Loop Recorder Insertion;  Surgeon: [name], MD;  Location: [place] [facility];  Service: Cardiovascular;  Laterality: N/A;  . SKIN CANCER EXCISION     x2  . TONSILLECTOMY      There were no vitals filed for this visit.  Subjective Assessment - 06/04/19 0935    Subjective  Patient reports that he is doing wel today. No soreness after last therapy session. Denies any resting pain upon arrival. No specific questions or concerns at  this time.    Pertinent History  The patient is a 85 y.o. male who was referred for physical therapy to treat his right hip. He is over 3 months status post percutaneous pinning of a right femoral neck fracture performed by [name] in [place]. Fall occurred on 10/25/18. After the surgery he went to SNF for 1 week and then discharged to his daughters house for 6 weeks. After returning home to [place] he started PT at an outpatient clinic in [place] and then transferred to [facility]. He was working with physical therapy at [facility] for "multiple weeks." He had scheduling issues with [facility] so he is here at [facility] for an evaluation. He is currently using a single point cane for ambulation for the last month. He would like to transfer off of his cane to no assistive device. He has been weightbearing as tolerated for some time.  He denies any significant resting groin or thigh pain upon arrival but does have some intermittent anterior and posterior hip pain on occasion. History of CVA 2 years ago but pt denies any residual deficits. Otherwise he denies any specific questions or concerns. Lives with wife in multilevel house but can stay on main floor, 2 steps to enter with L railing. No reported  trouble with stairs. Independent with ADLs/IADLs and drives.    Limitations  Walking    Patient Stated Goals  "I want to walk without a cane." Pt would like to improve his strength, gait, and balance    Currently in Pain?  No/denies          TREATMENT   Ther-ex Octane HIIT L4x 1 minute, L8x 1 minute for 5 minutes total during history (3 minutes unbilled); Quantum single leg press 105# x 15 RLE, x 20 LLE, 110# x 15 RLE, x 10 LLE; Lateral cross-over 6" step-ups x 10 each direction; SeatedHS stretch45s hold x 2 bilateral; Forward lunges onto BOSU (round side up) without UE support alternating LE x 10 each; Seated clams with manual resistance x 15; Seated adductor squeeze with  manual resistance x 15; Sit to stand from regular height chair no UE support with 2 Airex pads underfeet x 5;   Neuromuscular Re-education  Tandem balance on 1/2 foam roll (flat side up) alternating forward LE 30s x 2 each; Gait in hallway with ball pass between hands with head/eye follow x 75'; Gait in hallway with vertical ball lift with head/eye follow x 75'; Gait in hallway with horizontal ball toss to therapist with head/eye follow x 75' each direction; BOSU (round side up) static balance x 30s; BOSU (round side up) balance with horizontal head turns x 30s, significantly challenging for patient requiring regular min/modA+1 assist to prevent falls;   Pt educated throughout session about proper posture and technique with exercises. Improved exercise technique, movement at target joints, use of target muscles after min to mod verbal, visual, tactile cues.   Pt demonstrates good motivation during session and minimal fatigue. He is able to increase his resistance again on the leg press today. He continues to have difficulty with proper form during lunges.Pt struggles with head turns during ambulation with frequent LOB and stumbling. Encouraged pt to continue HEP and follow-up as scheduled. He will benefit from PT services to address deficits in strength, balance, and mobility in order to return to full function at home.                       PT Short Term Goals - 06/02/19 0933      PT SHORT TERM GOAL #1   Title  Pt will be independent with HEP in order to decrease hip pain and increase strength in order to improve pain-free function at home and work.    Time  6    Period  Weeks    Status  On-going    Target Date  06/16/19        PT Long Term Goals - 06/02/19 0934      PT LONG TERM GOAL #1   Title  Pt will increase LEFS by at least 9 points in order to demonstrate significant improvement in lower extremity function.      Baseline  02/12/19: 58/80; 05/05/19:  33/80; 06/03/19: 46/80    Time  12    Period  Weeks    Status  On-going    Target Date  07/28/19      PT LONG TERM GOAL #2   Title  Pt will decrease worst R hip pain as reported on NPRS by at least 3 points in order to demonstrate clinically significant reduction in ankle/foot pain.     Baseline  02/12/19: worst: 4/10; 05/05/19: 2/10; 06/02/19: "1-2/10 maybe once over the last couple weeks"    Time  12    Period  Weeks    Status  Partially Met    Target Date  07/28/19      PT LONG TERM GOAL #3   Title  Pt will increase strength of R hip extension and abduction by at least 1/2 MMT grade in order to demonstrate improvement in strength and function    Baseline  02/12/19: R hip extension 3/5, R hip abduction: 4-/5; 05/05/19: unchanged; 06/03/19: unchanged    Time  12    Period  Weeks    Status  On-going    Target Date  07/28/19      PT LONG TERM GOAL #4   Title  Pt will increase self-selected 10MWT by at least 0.13 m/s in order to demonstrate clinically significant improvement in community ambulation.    Baseline  02/12/19: 0.81 m/s; 05/05/19: 0.66 m/s; 06/03/19: 0.86 m/s    Time  12    Period  Weeks    Status  New    Target Date  07/28/19            Plan - 06/04/19 0936    Clinical Impression Statement  Pt demonstrates good motivation during session and minimal fatigue. He is able to increase his resistance again on the leg press today. He continues to have difficulty with proper form during lunges. Pt struggles with head turns during ambulation with frequent LOB and stumbling. Encouraged pt to continue HEP and follow-up as scheduled. He will benefit from PT services to address deficits in strength, balance, and mobility in order to return to full function at home.    Personal Factors and Comorbidities  Age;Comorbidity 1;Comorbidity 2;Comorbidity 3+    Comorbidities  CVA, CAD, HTN    Examination-Activity Limitations  Bend;Transfers    Examination-Participation Restrictions  Community Activity     Rehab Potential  Good    PT Frequency  2x / week    PT Duration  12 weeks    PT Treatment/Interventions  ADLs/Self Care Home Management;Aquatic Therapy;Biofeedback;Canalith Repostioning;Cryotherapy;Electrical Stimulation;Iontophoresis 66m/ml Dexamethasone;Moist Heat;Traction;Ultrasound;DME Instruction;Gait training;Stair training;Functional mobility training;Therapeutic activities;Therapeutic exercise;Balance training;Neuromuscular re-education;Patient/family education;Manual techniques;Passive range of motion;Dry needling;Vestibular    PT Next Visit Plan  Progress strength and balance, eventually get new order to address back pain    PT Home Exercise Plan  Medbridge Access Code: [email or web address]    Consulted and Agree with Plan of Care  Patient       Patient will benefit from skilled therapeutic intervention in order to improve the following deficits and impairments:  Abnormal gait, Decreased balance, Difficulty walking, Decreased strength  Visit Diagnosis: 1. Muscle weakness (generalized)   2. Other abnormalities of gait and mobility        Problem List Patient Active Problem List   Diagnosis Date Noted  . Stenosis of right carotid artery 02/01/2017  . Acute embolic stroke ([place] 027/11/9289 . Bradycardia 11/10/2015  . Essential tremor 06/03/2015  . Dizziness 08/21/2012  . Syncope 04/10/2011  . CALF PAIN, LEFT 10/11/2010  . DYSPEPSIA 07/31/2010  . CHEST DISCOMFORT 07/31/2010  . Hyperlipidemia 05/16/2010  . HYPERTENSION, BENIGN 05/16/2010  . CAD, NATIVE VESSEL 05/16/2010   JPhillips GroutPT, DPT, GCS  Huprich,Jason 06/04/2019, 11:26 AM  [place] [facility] [address] [address]Phone: [phone]  Fax:  [phone] Name[facility] MRN: [id number] of Birth: [birth year]

## 2019-06-09 ENCOUNTER — Ambulatory Visit: Payer: Medicare Other

## 2019-06-09 ENCOUNTER — Other Ambulatory Visit: Payer: Self-pay

## 2019-06-09 DIAGNOSIS — R2681 Unsteadiness on feet: Secondary | ICD-10-CM

## 2019-06-09 DIAGNOSIS — M6281 Muscle weakness (generalized): Secondary | ICD-10-CM

## 2019-06-09 DIAGNOSIS — R2689 Other abnormalities of gait and mobility: Secondary | ICD-10-CM

## 2019-06-09 NOTE — Therapy (Signed)
[place] MAIN [facility] SERVICES [address] [place], [place], [address] Phone: [phone]   Fax:  [phone]  Physical Therapy Treatment  Patient Details  Name: [name]. MRN: [id number] Date of Birth: August 03, 1933 Referring Provider (PT): Dr. Marry Guan   Encounter Date: 06/09/2019  PT End of Session - 06/09/19 0952    Visit Number  12    Number of Visits  29    Date for PT Re-Evaluation  07/28/19    Authorization Type  Eval: 02/16/19 (break following for COVID), reassessment 05/05/19    PT Start Time  0931    PT Stop Time  1016    PT Time Calculation (min)  45 min    Equipment Utilized During Treatment  Gait belt    Activity Tolerance  Patient tolerated treatment well    Behavior During Therapy  WFL for tasks assessed/performed       Past Medical History:  Diagnosis Date  . Acute embolic stroke ([place]) 0/0/3704  . BPH (benign prostatic hyperplasia)   . Bradycardia 11/10/2015  . Coronary artery disease   . Dizziness 08/21/2012  . DYSPEPSIA 07/31/2010   Qualifier: Diagnosis of  By: [name]    . Hyperlipidemia 05/16/2010   Qualifier: Diagnosis of  By: [name], [name]     . Hypertension   . HYPERTENSION, BENIGN 05/16/2010   Qualifier: Diagnosis of  By: [name], [name]    . Stenosis of right carotid artery 02/01/2017    Past Surgical History:  Procedure Laterality Date  . HIP SURGERY     right hip   . Intestinal Blockage    . LOOP RECORDER INSERTION N/A 05/14/2017   Procedure: Loop Recorder Insertion;  Surgeon: [name], MD;  Location: [place] [facility];  Service: Cardiovascular;  Laterality: N/A;  . SKIN CANCER EXCISION     x2  . TONSILLECTOMY      There were no vitals filed for this visit.  Subjective Assessment - 06/09/19 0933    Subjective  Pt reports that he is doing well today.  Pt reports no soreness today, just general aches from arthritis today.  No soreness after last therapy session.  Pt  reports that he has been doing a lot of yard work since last visit, but that he hasn't been doing his HEP.  No specific questions or concerns at this time.    Pertinent History  The patient is a 85 y.o. male who was referred for physical therapy to treat his right hip. He is over 3 months status post percutaneous pinning of a right femoral neck fracture performed by [name] in [place]. Fall occurred on 10/25/18. After the surgery he went to SNF for 1 week and then discharged to his daughters house for 6 weeks. After returning home to [place] he started PT at an outpatient clinic in [place] and then transferred to [facility]. He was working with physical therapy at [facility] for "multiple weeks." He had scheduling issues with [facility] so he is here at [facility] for an evaluation. He is currently using a single point cane for ambulation for the last month. He would like to transfer off of his cane to no assistive device. He has been weightbearing as tolerated for some time.  He denies any significant resting groin or thigh pain upon arrival but does have some intermittent anterior and posterior hip pain on occasion. History of CVA 2 years ago but pt denies  any residual deficits. Otherwise he denies any specific questions or concerns. Lives with wife in multilevel house but can stay on main floor, 2 steps to enter with L railing. No reported trouble with stairs. Independent with ADLs/IADLs and drives.    Patient Stated Goals  "I want to walk without a cane." Pt would like to improve his strength, gait, and balance    Currently in Pain?  Yes    Pain Score  2     Pain Location  Generalized    Pain Orientation  Right;Left    Pain Descriptors / Indicators  Sore    Pain Type  Other (Comment)   generalized soreness from arthritis today   Pain Onset  More than a month ago    Pain Frequency  Intermittent          TREATMENT   Ther-ex Octane HIIT L4x 1 minute, L8x 1 minute for 5  minutes total during history (3 minutes unbilled); Quantum singlelegpress 105# 2x10 RLE, 2x12 LLE  Body weight squats on ground without UE support 1x12 Body weight squats on foam without UE support 1x15 with occasional LOB requiring up to minA for steadying; Forward weighted walks with cable column- forward, backward, and laterally 7.5 lbs x1 in each direction;   Neuromuscular Re-education  SL balance with dynamic reaching attempted, with pt struggling to find balance on RLE SL balance on with L foot on the ground and R foot on airex pad with dynamic reaching with L hand 1x10 Feet together on foam and dynamic reaching with RUE 1x10 1 foot on foam and 1 foot in tandem on step, performed bilaterally with dynamic reaching 1x10 in each direction BOSU round side up with lateral stepping 1x15 performed bilaterally  BOSU round side up with forward stepping 1x15 performed bilaterally with minA for occasional LOBs   Pt educated throughout session about proper posture and technique with exercises. Improved exercise technique, movement at target joints, use of target muscles after min to mod verbal, visual, tactile cues.   Pt demonstrates good motivation during session, but displays fatigue throughout session. Pt displays difficulty with stepping onto BOSU ball, as well as with SL stance, requiring up to minA for steadying.  With dynamic reaching, pt demostrates occasional LOB posteriorly. Encouraged pt to continue HEP, to bring a copy with him next visit, and follow-up as scheduled. He will benefit from PT services to address deficits in strength, balance, and mobility in order to return to full function at home.                       PT Short Term Goals - 06/02/19 0933      PT SHORT TERM GOAL #1   Title  Pt will be independent with HEP in order to decrease hip pain and increase strength in order to improve pain-free function at home and work.    Time  6    Period  Weeks     Status  On-going    Target Date  06/16/19        PT Long Term Goals - 06/02/19 0934      PT LONG TERM GOAL #1   Title  Pt will increase LEFS by at least 9 points in order to demonstrate significant improvement in lower extremity function.      Baseline  02/12/19: 58/80; 05/05/19: 33/80; 06/03/19: 46/80    Time  12    Period  Weeks    Status  On-going  Target Date  07/28/19      PT LONG TERM GOAL #2   Title  Pt will decrease worst R hip pain as reported on NPRS by at least 3 points in order to demonstrate clinically significant reduction in ankle/foot pain.     Baseline  02/12/19: worst: 4/10; 05/05/19: 2/10; 06/02/19: "1-2/10 maybe once over the last couple weeks"    Time  12    Period  Weeks    Status  Partially Met    Target Date  07/28/19      PT LONG TERM GOAL #3   Title  Pt will increase strength of R hip extension and abduction by at least 1/2 MMT grade in order to demonstrate improvement in strength and function    Baseline  02/12/19: R hip extension 3/5, R hip abduction: 4-/5; 05/05/19: unchanged; 06/03/19: unchanged    Time  12    Period  Weeks    Status  On-going    Target Date  07/28/19      PT LONG TERM GOAL #4   Title  Pt will increase self-selected 10MWT by at least 0.13 m/s in order to demonstrate clinically significant improvement in community ambulation.    Baseline  02/12/19: 0.81 m/s; 05/05/19: 0.66 m/s; 06/03/19: 0.86 m/s    Time  12    Period  Weeks    Status  New    Target Date  07/28/19            Plan - 06/09/19 0953    Clinical Impression Statement  Pt demonstrates good motivation during session, but displays fatigue throughout session. Pt displays difficulty with stepping onto BOSU ball, as well as with SL stance, requiring up to minA for steadying.  With dynamic reaching, pt demostrates occasional LOB posteriorly.  Encouraged pt to continue HEP, to bring a copy with him next visit, and follow-up as scheduled. He will benefit from PT services to address  deficits in strength, balance, and mobility in order to return to full function at home.    Personal Factors and Comorbidities  Age;Comorbidity 1;Comorbidity 2;Comorbidity 3+    Comorbidities  CVA, CAD, HTN    Examination-Activity Limitations  Bend;Transfers    Examination-Participation Restrictions  Community Activity    Rehab Potential  Good    PT Frequency  2x / week    PT Duration  12 weeks    PT Treatment/Interventions  ADLs/Self Care Home Management;Aquatic Therapy;Biofeedback;Canalith Repostioning;Cryotherapy;Electrical Stimulation;Iontophoresis 28m/ml Dexamethasone;Moist Heat;Traction;Ultrasound;DME Instruction;Gait training;Stair training;Functional mobility training;Therapeutic activities;Therapeutic exercise;Balance training;Neuromuscular re-education;Patient/family education;Manual techniques;Passive range of motion;Dry needling;Vestibular    PT Next Visit Plan  Progress strength and balance, eventually get new order to address back pain    PT Home Exercise Plan  Medbridge Access Code: [email or web address]    Consulted and Agree with Plan of Care  Patient       Patient will benefit from skilled therapeutic intervention in order to improve the following deficits and impairments:  Abnormal gait, Decreased balance, Difficulty walking, Decreased strength  Visit Diagnosis: 1. Muscle weakness (generalized)   2. Other abnormalities of gait and mobility   3. Unsteadiness on feet        Problem List Patient Active Problem List   Diagnosis Date Noted  . Stenosis of right carotid artery 02/01/2017  . Acute embolic stroke ([facility] 037/09/6268 . Bradycardia 11/10/2015  . Essential tremor 06/03/2015  . Dizziness 08/21/2012  . Syncope 04/10/2011  . CALF PAIN, LEFT 10/11/2010  . DYSPEPSIA 07/31/2010  . CHEST DISCOMFORT  07/31/2010  . Hyperlipidemia 05/16/2010  . HYPERTENSION, BENIGN 05/16/2010  . CAD, NATIVE VESSEL 05/16/2010   Phillips Grout PT, DPT, GCS  Khyler Eschmann 06/09/2019, 1:22  PM  [place] MAIN [facility] SERVICES [address] [place], [place], [address] Phone: [phone]   Fax:  [phone]  Name: [name] [facility]. MRN: [id number] Date of Birth: March 04, 1933

## 2019-06-11 ENCOUNTER — Other Ambulatory Visit: Payer: Self-pay

## 2019-06-11 ENCOUNTER — Ambulatory Visit: Payer: Medicare Other

## 2019-06-11 DIAGNOSIS — M6281 Muscle weakness (generalized): Secondary | ICD-10-CM

## 2019-06-11 DIAGNOSIS — R2689 Other abnormalities of gait and mobility: Secondary | ICD-10-CM

## 2019-06-11 DIAGNOSIS — R2681 Unsteadiness on feet: Secondary | ICD-10-CM

## 2019-06-11 NOTE — Therapy (Signed)
[place] MAIN [facility] SERVICES [address] [place], [place], [address] Phone: [phone]   Fax:  [phone]  Physical Therapy Treatment  Patient Details  Name: [name]. MRN: [id number] Date of Birth: [birth year] Referring Provider (PT): Dr. Marry Guan   Encounter Date: 06/11/2019  PT End of Session - 06/11/19 1239    Visit Number  13    Number of Visits  40    Date for PT Re-Evaluation  07/28/19    Authorization Type  Eval: 83/16/20 (break following for COVID), reassessment 05/05/19    PT Start Time  0935    PT Stop Time  1015    PT Time Calculation (min)  40 min    Equipment Utilized During Treatment  Gait belt    Activity Tolerance  Patient tolerated treatment well    Behavior During Therapy  WFL for tasks assessed/performed       Past Medical History:  Diagnosis Date  . Acute embolic stroke ([place]) 08/10/3381  . BPH (benign prostatic hyperplasia)   . Bradycardia 11/10/2015  . Coronary artery disease   . Dizziness 08/21/2012  . DYSPEPSIA 07/31/2010   Qualifier: Diagnosis of  By: [name]    . Hyperlipidemia 05/16/2010   Qualifier: Diagnosis of  By: [name], [name]     . Hypertension   . HYPERTENSION, BENIGN 05/16/2010   Qualifier: Diagnosis of  By: [name], [name]    . Stenosis of right carotid artery 02/01/2017    Past Surgical History:  Procedure Laterality Date  . HIP SURGERY     right hip   . Intestinal Blockage    . LOOP RECORDER INSERTION N/A 05/14/2017   Procedure: Loop Recorder Insertion;  Surgeon: [name], MD;  Location: [place] [facility];  Service: Cardiovascular;  Laterality: N/A;  . SKIN CANCER EXCISION     x2  . TONSILLECTOMY      There were no vitals filed for this visit.  Subjective Assessment - 06/11/19 1237    Subjective  Pt reports that he is doing well today.  He is having some anterior R knee pain upon arrival that he rates as 2/10 since his last session. Otherwise no reported  pain. No muscle soreness reported after last therapy session. Pt still has not been compliant with his HEP and forgot to bring in his papers today again. No specific questions or concerns at this time.    Pertinent History  The patient is a 85 y.o. male who was referred for physical therapy to treat his right hip. He is over 3 months status post percutaneous pinning of a right femoral neck fracture performed by [name] in [place]. Fall occurred on 10/25/18. After the surgery he went to SNF for 1 week and then discharged to his daughters house for 6 weeks. After returning home to [place] he started PT at an outpatient clinic in [place] and then transferred to [facility]. He was working with physical therapy at [facility] for "multiple weeks." He had scheduling issues with [facility] so he is here at [facility] for an evaluation. He is currently using a single point cane for ambulation for the last month. He would like to transfer off of his cane to no assistive device. He has been weightbearing as tolerated for some time.  He denies any significant resting groin or thigh pain upon arrival but does have some intermittent anterior and posterior hip pain on occasion. History of CVA  2 years ago but pt denies any residual deficits. Otherwise he denies any specific questions or concerns. Lives with wife in multilevel house but can stay on main floor, 2 steps to enter with L railing. No reported trouble with stairs. Independent with ADLs/IADLs and drives.    Patient Stated Goals  "I want to walk without a cane." Pt would like to improve his strength, gait, and balance    Currently in Pain?  Yes    Pain Score  2     Pain Location  Knee    Pain Orientation  Right;Anterior    Pain Descriptors / Indicators  Aching    Pain Type  Acute pain    Pain Onset  More than a month ago    Pain Frequency  Intermittent        TREATMENT   Ther-ex Octane HIIT L2-3x 2 minutes, stopped due to pt needing  to use restroom. Pt takes prolonged restroom break; Avoided leg press today due to reports of 2/10 R knee pain following last session which has persisted until today; Sit to stand from regular height chair with Airex under feet, alternating head turns right and left x 10; BOSU split squats without UE support x 10 with each leg forward; Body weight squats on foam without UE support x 15 with occasional LOB requiring up to minA for steadying; Forward, backward, and lateral weighted cable walks 12.5# x 3 in each direction;   Neuromuscular Re-education Airex balance feet together with balloon "juggling" keeping balloon in air 60s x 2; BOSU split squats without UE support x 10 each side; Cone taps in half circle pattern in front of patient alternating sides x multiple bouts on each side; Airex cone taps in half circle pattern in front of patient alternating sides x multiple bouts on each side; Braiding in rehab gym 35' x 2, pt struggles significantly with sequencing requiring heavy verbal and tactile cues as well as min/modA+1 guarding due to instability   Pt educated throughout session about proper posture and technique with exercises. Improved exercise technique, movement at target joints, use of target muscles after min to mod verbal, visual, tactile cues.   Pt demonstrates good motivation during session, but displays fatigue throughout session. He reports decreased R knee pain following warm-up and exercise. He takes a prolonged restroom break during session thus resulting in slightly shorter session today. Pt forgot to bring copy of his HEP so encouraged him to bring at next visit. No HEP progression at this time as pt remains minimally compliant.He will benefit from PT services to address deficits in strength, balance, and mobility in order to return to full function at home.                         PT Short Term Goals - 06/02/19 0933      PT SHORT TERM GOAL #1    Title  Pt will be independent with HEP in order to decrease hip pain and increase strength in order to improve pain-free function at home and work.    Time  6    Period  Weeks    Status  On-going    Target Date  06/16/19        PT Long Term Goals - 06/02/19 0934      PT LONG TERM GOAL #1   Title  Pt will increase LEFS by at least 9 points in order to demonstrate significant improvement in lower extremity  function.      Baseline  02/12/19: 58/80; 05/05/19: 33/80; 06/03/19: 46/80    Time  12    Period  Weeks    Status  On-going    Target Date  07/28/19      PT LONG TERM GOAL #2   Title  Pt will decrease worst R hip pain as reported on NPRS by at least 3 points in order to demonstrate clinically significant reduction in ankle/foot pain.     Baseline  02/12/19: worst: 4/10; 05/05/19: 2/10; 06/02/19: "1-2/10 maybe once over the last couple weeks"    Time  12    Period  Weeks    Status  Partially Met    Target Date  07/28/19      PT LONG TERM GOAL #3   Title  Pt will increase strength of R hip extension and abduction by at least 1/2 MMT grade in order to demonstrate improvement in strength and function    Baseline  02/12/19: R hip extension 3/5, R hip abduction: 4-/5; 05/05/19: unchanged; 06/03/19: unchanged    Time  12    Period  Weeks    Status  On-going    Target Date  07/28/19      PT LONG TERM GOAL #4   Title  Pt will increase self-selected 10MWT by at least 0.13 m/s in order to demonstrate clinically significant improvement in community ambulation.    Baseline  02/12/19: 0.81 m/s; 05/05/19: 0.66 m/s; 06/03/19: 0.86 m/s    Time  12    Period  Weeks    Status  New    Target Date  07/28/19            Plan - 06/11/19 1241    Clinical Impression Statement  Pt demonstrates good motivation during session, but displays fatigue throughout session. He reports decreased R knee pain following warm-up and exercise. He takes a prolonged restroom break during session thus resulting in slightly  shorter session today. Pt forgot to bring copy of his HEP so encouraged him to bring at next visit. No HEP progression at this time as pt remains minimally compliant. He will benefit from PT services to address deficits in strength, balance, and mobility in order to return to full function at home.    Personal Factors and Comorbidities  Age;Comorbidity 1;Comorbidity 2;Comorbidity 3+    Comorbidities  CVA, CAD, HTN    Examination-Activity Limitations  Bend;Transfers    Examination-Participation Restrictions  Community Activity    Rehab Potential  Good    PT Frequency  2x / week    PT Duration  12 weeks    PT Treatment/Interventions  ADLs/Self Care Home Management;Aquatic Therapy;Biofeedback;Canalith Repostioning;Cryotherapy;Electrical Stimulation;Iontophoresis 98m/ml Dexamethasone;Moist Heat;Traction;Ultrasound;DME Instruction;Gait training;Stair training;Functional mobility training;Therapeutic activities;Therapeutic exercise;Balance training;Neuromuscular re-education;Patient/family education;Manual techniques;Passive range of motion;Dry needling;Vestibular    PT Next Visit Plan  Progress strength and balance, eventually get new order to address back pain    PT Home Exercise Plan  Medbridge Access Code: [email or web address]    Consulted and Agree with Plan of Care  Patient       Patient will benefit from skilled therapeutic intervention in order to improve the following deficits and impairments:  Abnormal gait, Decreased balance, Difficulty walking, Decreased strength  Visit Diagnosis: 1. Muscle weakness (generalized)   2. Other abnormalities of gait and mobility   3. Unsteadiness on feet        Problem List Patient Active Problem List   Diagnosis Date Noted  . Stenosis of right carotid  artery 02/01/2017  . Acute embolic stroke ([place]) 16/09/9603  . Bradycardia 11/10/2015  . Essential tremor 06/03/2015  . Dizziness 08/21/2012  . Syncope 04/10/2011  . CALF PAIN, LEFT 10/11/2010  . DYSPEPSIA  07/31/2010  . CHEST DISCOMFORT 07/31/2010  . Hyperlipidemia 05/16/2010  . HYPERTENSION, BENIGN 05/16/2010  . CAD, NATIVE VESSEL 05/16/2010   Phillips Grout PT, DPT, GCS  Anam Bobby 06/11/2019, 12:44 PM  [place] MAIN [facility] SERVICES [address] [place], [place], [address] Phone: [phone]   Fax:  [phone]  Name: Uziah Sorter [facility]. MRN: [id number] Date of Birth: [birth year]

## 2019-06-15 ENCOUNTER — Telehealth: Payer: Self-pay

## 2019-06-15 NOTE — Telephone Encounter (Signed)
Spoke with patient regarding disconnected monitor 

## 2019-06-16 ENCOUNTER — Ambulatory Visit: Payer: Medicare Other

## 2019-06-16 ENCOUNTER — Other Ambulatory Visit: Payer: Self-pay

## 2019-06-16 DIAGNOSIS — M6281 Muscle weakness (generalized): Secondary | ICD-10-CM | POA: Diagnosis not present

## 2019-06-16 DIAGNOSIS — R2681 Unsteadiness on feet: Secondary | ICD-10-CM

## 2019-06-16 DIAGNOSIS — R2689 Other abnormalities of gait and mobility: Secondary | ICD-10-CM

## 2019-06-16 NOTE — Therapy (Signed)
[place] MAIN [facility] SERVICES [address] [place], [place], [address] Phone: [phone]   Fax:  [phone]  Physical Therapy Treatment  Patient Details  Name: [name]. MRN: [id number] Date of Birth: 30-Aug-1933 Referring Provider (PT): Dr. Marry Guan   Encounter Date: 06/16/2019  PT End of Session - 06/16/19 1045    Visit Number  14    Number of Visits  77    Date for PT Re-Evaluation  07/28/19    Authorization Type  Eval: 02/16/19 (break following for COVID), goals updated 06/02/19    PT Start Time  1045    PT Stop Time  1130    PT Time Calculation (min)  45 min    Equipment Utilized During Treatment  Gait belt    Activity Tolerance  Patient tolerated treatment well    Behavior During Therapy  WFL for tasks assessed/performed       Past Medical History:  Diagnosis Date  . Acute embolic stroke ([place]) 07/07/1659  . BPH (benign prostatic hyperplasia)   . Bradycardia 11/10/2015  . Coronary artery disease   . Dizziness 08/21/2012  . DYSPEPSIA 07/31/2010   Qualifier: Diagnosis of  By: [name]    . Hyperlipidemia 05/16/2010   Qualifier: Diagnosis of  By: [name], [name]     . Hypertension   . HYPERTENSION, BENIGN 05/16/2010   Qualifier: Diagnosis of  By: [name], [name]    . Stenosis of right carotid artery 02/01/2017    Past Surgical History:  Procedure Laterality Date  . HIP SURGERY     right hip   . Intestinal Blockage    . LOOP RECORDER INSERTION N/A 05/14/2017   Procedure: Loop Recorder Insertion;  Surgeon: [name], MD;  Location: [place] [facility];  Service: Cardiovascular;  Laterality: N/A;  . SKIN CANCER EXCISION     x2  . TONSILLECTOMY      There were no vitals filed for this visit.  Subjective Assessment - 06/16/19 1142    Subjective  Pt. reports doing well today with no reports of pain. Pt. reports no recent falls. Pt. presents to PT prepared with HEP handout that he was asked to bring at  the last session and enthusiastic about physical therapy. No concerns or questions at this time.    Pertinent History  The patient is a 85 y.o. male who was referred for physical therapy to treat his right hip. He is over 3 months status post percutaneous pinning of a right femoral neck fracture performed by [name] in [place]. Fall occurred on 10/25/18. After the surgery he went to SNF for 1 week and then discharged to his daughters house for 6 weeks. After returning home to [place] he started PT at an outpatient clinic in [place] and then transferred to [facility]. He was working with physical therapy at [facility] for "multiple weeks." He had scheduling issues with [facility] so he is here at [facility] for an evaluation. He is currently using a single point cane for ambulation for the last month. He would like to transfer off of his cane to no assistive device. He has been weightbearing as tolerated for some time.  He denies any significant resting groin or thigh pain upon arrival but does have some intermittent anterior and posterior hip pain on occasion. History of CVA 2 years ago but pt denies any residual deficits. Otherwise he denies any specific questions or concerns. Lives with wife  in multilevel house but can stay on main floor, 2 steps to enter with L railing. No reported trouble with stairs. Independent with ADLs/IADLs and drives.    Patient Stated Goals  "I want to walk without a cane." Pt would like to improve his strength, gait, and balance    Currently in Pain?  No/denies    Pain Onset  --          TREATMENT Pt requires CGA for all BOSU and airex exercises; all exercises done in // bars   Therapeutic Exercise Octane L2-3 x5 minutes; pt performs without UE  Airex squat without UE support 2x10  BOSU split squat without UE support x10 each BOSU forward and lateral step ups 2x10 each; pt requires min-modA for recovery from LOB during forward step up      Neuromuscular Re-education Standing airex feet together x30sec Standing airex EC, EO with head turns vertical & horizontal 2x30sec each  Hedge hog taps PT guiding color and LE for strength, balance, coordination with cognitive component x10 each, firm surface was too easy for pt added in standing airex to challenge balance   Pt educated throughout session about proper posture and technique with exercises. Improved exercise technique, movement at target joints, use of target muscles after min to mod verbal, visual, tactile cues.     Pt demonstrates enthusiasm toward physical therapy and was prepared for exercises. Pt demonstrates good motivation during session. Pt demonstrates capable of higher level balance activities with a minimum of CGA. Pt LOB posteriorly often during airex and BOSU exercises requiring min-modA for recovery. Pt heavily relies on UE support to catch self when LOB rather than utilizing compensatory strategies. Pt requires verbal cues for technique and to not rush through exercises. Pt demonstrates improved balance and form when decreasing speed of exercise. Pt attempted hedge hog taps with 2-consecutive color taps to increase single leg stance time with difficulty and increased time but capable of performance. He will benefit from PT services to address deficits in strength, balance, and mobility in order to return to full function at home.     PT Education - 06/16/19 1143    Education Details  Exercise technique and form    Person(s) Educated  Patient    Methods  Explanation;Demonstration;Verbal cues;Tactile cues    Comprehension  Returned demonstration;Verbalized understanding;Verbal cues required       PT Short Term Goals - 06/02/19 0933      PT SHORT TERM GOAL #1   Title  Pt will be independent with HEP in order to decrease hip pain and increase strength in order to improve pain-free function at home and work.    Time  6    Period  Weeks    Status  On-going     Target Date  06/16/19        PT Long Term Goals - 06/02/19 0934      PT LONG TERM GOAL #1   Title  Pt will increase LEFS by at least 9 points in order to demonstrate significant improvement in lower extremity function.      Baseline  02/12/19: 58/80; 05/05/19: 33/80; 06/03/19: 46/80    Time  12    Period  Weeks    Status  On-going    Target Date  07/28/19      PT LONG TERM GOAL #2   Title  Pt will decrease worst R hip pain as reported on NPRS by at least 3 points in order to demonstrate clinically  significant reduction in ankle/foot pain.     Baseline  02/12/19: worst: 4/10; 05/05/19: 2/10; 06/02/19: "1-2/10 maybe once over the last couple weeks"    Time  12    Period  Weeks    Status  Partially Met    Target Date  07/28/19      PT LONG TERM GOAL #3   Title  Pt will increase strength of R hip extension and abduction by at least 1/2 MMT grade in order to demonstrate improvement in strength and function    Baseline  02/12/19: R hip extension 3/5, R hip abduction: 4-/5; 05/05/19: unchanged; 06/03/19: unchanged    Time  12    Period  Weeks    Status  On-going    Target Date  07/28/19      PT LONG TERM GOAL #4   Title  Pt will increase self-selected 10MWT by at least 0.13 m/s in order to demonstrate clinically significant improvement in community ambulation.    Baseline  02/12/19: 0.81 m/s; 05/05/19: 0.66 m/s; 06/03/19: 0.86 m/s    Time  12    Period  Weeks    Status  New    Target Date  07/28/19            Plan - 06/16/19 1147    Clinical Impression Statement  Pt demonstrates enthusiasm toward physical therapy and was prepared for exercises. Pt demonstrates good motivation during session. Pt demonstrates capable of higher level balance activities with a minimum of CGA. Pt LOB posteriorly often during airex and BOSU exercises requiring min-modA for recovery. Pt heavily relies on UE support to catch self when LOB rather than utilizing compensatory strategies. Pt requires verbal cues for  technique and to not rush through exercises. Pt demonstrates improved balance and form when decreasing speed of exercise. Pt attempted hedge hog taps with 2-consecutive color taps to increase single leg stance time with difficulty and increased time but capable of performance. He will benefit from PT services to address deficits in strength, balance, and mobility in order to return to full function at home.    Personal Factors and Comorbidities  Age;Comorbidity 1;Comorbidity 2;Comorbidity 3+    Comorbidities  CVA, CAD, HTN    Examination-Activity Limitations  Bend;Transfers    Examination-Participation Restrictions  Community Activity    Rehab Potential  Good    PT Frequency  2x / week    PT Duration  12 weeks    PT Treatment/Interventions  ADLs/Self Care Home Management;Aquatic Therapy;Biofeedback;Canalith Repostioning;Cryotherapy;Electrical Stimulation;Iontophoresis 37m/ml Dexamethasone;Moist Heat;Traction;Ultrasound;DME Instruction;Gait training;Stair training;Functional mobility training;Therapeutic activities;Therapeutic exercise;Balance training;Neuromuscular re-education;Patient/family education;Manual techniques;Passive range of motion;Dry needling;Vestibular    PT Next Visit Plan  Progress strength and balance, eventually get new order to address back pain    PT Home Exercise Plan  Medbridge Access Code: [email or web address]    Consulted and Agree with Plan of Care  Patient       Patient will benefit from skilled therapeutic intervention in order to improve the following deficits and impairments:  Abnormal gait, Decreased balance, Difficulty walking, Decreased strength  Visit Diagnosis: 1. Muscle weakness (generalized)   2. Other abnormalities of gait and mobility   3. Unsteadiness on feet        Problem List Patient Active Problem List   Diagnosis Date Noted  . Stenosis of right carotid artery 02/01/2017  . Acute embolic stroke ([place] 035/00/9381 . Bradycardia 11/10/2015  . Essential  tremor 06/03/2015  . Dizziness 08/21/2012  . Syncope 04/10/2011  . CALF PAIN,  LEFT 10/11/2010  . DYSPEPSIA 07/31/2010  . CHEST DISCOMFORT 07/31/2010  . Hyperlipidemia 05/16/2010  . HYPERTENSION, BENIGN 05/16/2010  . CAD, NATIVE VESSEL 05/16/2010   Juliann Pulse SPT Phillips Grout PT, DPT, GCS  Huprich,Jason 06/16/2019, 2:57 PM  [place] MAIN [facility] SERVICES [address] [place], [place], [address] Phone: [phone]   Fax:  [phone]  Name: Calvin Hsiung [facility] MRN: [id number] Date of Birth: 08-24-1933

## 2019-06-18 ENCOUNTER — Ambulatory Visit: Payer: Medicare Other

## 2019-06-18 ENCOUNTER — Other Ambulatory Visit: Payer: Self-pay

## 2019-06-18 DIAGNOSIS — R2681 Unsteadiness on feet: Secondary | ICD-10-CM

## 2019-06-18 DIAGNOSIS — R2689 Other abnormalities of gait and mobility: Secondary | ICD-10-CM

## 2019-06-18 DIAGNOSIS — M6281 Muscle weakness (generalized): Secondary | ICD-10-CM | POA: Diagnosis not present

## 2019-06-18 NOTE — Therapy (Signed)
[place] MAIN [facility] SERVICES [address] [place], [place], [address] Phone: [phone]   Fax:  [phone]  Physical Therapy Treatment  Patient Details  Name: [name]. MRN: [id number] Date of Birth: 09-25-1933 Referring Provider (PT): Dr. Marry Guan   Encounter Date: 06/18/2019  PT End of Session - 06/18/19 0925    Visit Number  15    Number of Visits  96    Date for PT Re-Evaluation  07/28/19    Authorization Type  Eval: 02/16/19 (break following for COVID), goals updated 06/02/19    PT Start Time  0930    PT Stop Time  1015    PT Time Calculation (min)  45 min    Equipment Utilized During Treatment  Gait belt    Activity Tolerance  Patient tolerated treatment well    Behavior During Therapy  WFL for tasks assessed/performed       Past Medical History:  Diagnosis Date  . Acute embolic stroke ([place]) 12/08/1759  . BPH (benign prostatic hyperplasia)   . Bradycardia 11/10/2015  . Coronary artery disease   . Dizziness 08/21/2012  . DYSPEPSIA 07/31/2010   Qualifier: Diagnosis of  By: [name]    . Hyperlipidemia 05/16/2010   Qualifier: Diagnosis of  By: [name], [name]     . Hypertension   . HYPERTENSION, BENIGN 05/16/2010   Qualifier: Diagnosis of  By: [name], [name]    . Stenosis of right carotid artery 02/01/2017    Past Surgical History:  Procedure Laterality Date  . HIP SURGERY     right hip   . Intestinal Blockage    . LOOP RECORDER INSERTION N/A 05/14/2017   Procedure: Loop Recorder Insertion;  Surgeon: [name], MD;  Location: [place] [facility];  Service: Cardiovascular;  Laterality: N/A;  . SKIN CANCER EXCISION     x2  . TONSILLECTOMY      There were no vitals filed for this visit.  Subjective Assessment - 06/18/19 0924    Subjective  Pt reports doing well with no concerns or questions at this time. Pt. reports minor LLE pain today likely due to being active doing yardwork at home.     Pertinent History  The patient is a 85 y.o. male who was referred for physical therapy to treat his right hip. He is over 3 months status post percutaneous pinning of a right femoral neck fracture performed by [name] in [place]. Fall occurred on 10/25/18. After the surgery he went to SNF for 1 week and then discharged to his daughters house for 6 weeks. After returning home to [place] he started PT at an outpatient clinic in [place] and then transferred to [facility]. He was working with physical therapy at [facility] for "multiple weeks." He had scheduling issues with [facility] so he is here at [facility] for an evaluation. He is currently using a single point cane for ambulation for the last month. He would like to transfer off of his cane to no assistive device. He has been weightbearing as tolerated for some time.  He denies any significant resting groin or thigh pain upon arrival but does have some intermittent anterior and posterior hip pain on occasion. History of CVA 2 years ago but pt denies any residual deficits. Otherwise he denies any specific questions or concerns. Lives with wife in multilevel house but can stay on main floor, 2 steps to enter with L railing. No reported  trouble with stairs. Independent with ADLs/IADLs and drives.    Patient Stated Goals  "I want to walk without a cane." Pt would like to improve his strength, gait, and balance    Currently in Pain?  Yes    Pain Score  2     Pain Location  Knee    Pain Orientation  Left    Pain Descriptors / Indicators  Aching    Pain Type  Acute pain         TREATMENT Pt requires CGA for all BOSU and airex exercises  Therapeutic Exercise Octane L2-3 x5 minutes for warmup throughout subjective; pt performs without UE. Fatigue monitored througout;  Neuromuscular Re-education STS with airex under bilateral LE and on chair to inc height of chair 2x10; attempted without airex on chair, depth too difficult for pt to  perform with correct form Rocker board squat 2x10 rocker board  BOSU split squat without UE support x10 each BOSU forward step up with lead LE stationary throughout set x10 each LE; pt requires min-modA for recovery from LOB during forward step up    BOSU lateral step up with lead LE stationary throughout set x10 each LE; Standing airex cone taps half circle with consecutive taps to increase single leg stance time x7  each LE Gait in gym with obstacles step over 1/2 foam, step on airex, and weave through 3-cones 25' x2  Step fwd/backward over 1/2 foam x10 each LE  Step lateral over 1/2 foam x10 each LE Braiding in hallway 21' x2 for corrective stepping strategy is LOB laterally Gait in hallway forward with horizontal and vertical head turns 30' x2 each    Pt educated throughout session about proper posture and technique with exercises. Improved exercise technique, movement at target joints, use of target muscles after min to mod verbal, visual, tactile cues.     Pt demonstrates enthusiasm toward physical therapy and was prepared for exercises. Pt demonstrates good motivation during session. Pt demonstrates capable of higher level balance activities with a minimum of CGA. Pt LOB posteriorly often during airex and BOSU exercises requiring min-modA for recovery. Pt heavily relies on UE support to catch self when LOB rather than utilizing compensatory strategies. Pt requires verbal cues for technique and to not rush through exercises. Pt demonstrates improved balance and form when decreasing speed of exercise. Pt demonstrates difficulty with single leg balance on airex needing intermittent toe touchdown and unilateral UE support. Pt demonstrates improvement with BOSU step ups when able to keep lead LE stationary without alternating LE. Pt has difficulty with static balance on BOSU demonstrated by quick descent during step down exercises. Pt performs braiding with good sequencing however catches the  moving LE on the contralateral side when moving posteriorly often causing a LOB.  He will benefit from PT services to address deficits in strength, balance, and mobility in order to return to full function at home.        PT Education - 06/18/19 0924    Education Details  exercise technique/form    Person(s) Educated  Patient    Methods  Explanation;Demonstration;Tactile cues    Comprehension  Verbal cues required;Verbalized understanding;Returned demonstration       PT Short Term Goals - 06/02/19 0933      PT SHORT TERM GOAL #1   Title  Pt will be independent with HEP in order to decrease hip pain and increase strength in order to improve pain-free function at home and work.    Time  6    Period  Weeks    Status  On-going    Target Date  06/16/19        PT Long Term Goals - 06/02/19 0934      PT LONG TERM GOAL #1   Title  Pt will increase LEFS by at least 9 points in order to demonstrate significant improvement in lower extremity function.      Baseline  02/12/19: 58/80; 05/05/19: 33/80; 06/03/19: 46/80    Time  12    Period  Weeks    Status  On-going    Target Date  07/28/19      PT LONG TERM GOAL #2   Title  Pt will decrease worst R hip pain as reported on NPRS by at least 3 points in order to demonstrate clinically significant reduction in ankle/foot pain.     Baseline  02/12/19: worst: 4/10; 05/05/19: 2/10; 06/02/19: "1-2/10 maybe once over the last couple weeks"    Time  12    Period  Weeks    Status  Partially Met    Target Date  07/28/19      PT LONG TERM GOAL #3   Title  Pt will increase strength of R hip extension and abduction by at least 1/2 MMT grade in order to demonstrate improvement in strength and function    Baseline  02/12/19: R hip extension 3/5, R hip abduction: 4-/5; 05/05/19: unchanged; 06/03/19: unchanged    Time  12    Period  Weeks    Status  On-going    Target Date  07/28/19      PT LONG TERM GOAL #4   Title  Pt will increase self-selected 10MWT by  at least 0.13 m/s in order to demonstrate clinically significant improvement in community ambulation.    Baseline  02/12/19: 0.81 m/s; 05/05/19: 0.66 m/s; 06/03/19: 0.86 m/s    Time  12    Period  Weeks    Status  New    Target Date  07/28/19            Plan - 06/18/19 0925    Clinical Impression Statement  Pt demonstrates enthusiasm toward physical therapy and was prepared for exercises. He is capable of higher level balance activities with a minimum of CGA. LOB posteriorly often during airex and BOSU exercises requiring min-modA for recovery. Pt heavily relies on UE support to catch self when LOB rather than utilizing compensatory strategies. He requires verbal cues for technique and to not rush through exercises. Pt demonstrates improved balance and form when decreasing speed of exercise. He has difficulty with single leg balance on airex needing intermittent toe touchdown and unilateral UE support. Pt demonstrates improvement with BOSU step ups when able to keep lead LE stationary without alternating LE. Pt performs braiding with good sequencing however catches the moving LE on the contralateral side when moving posteriorly often causing a LOB.  He will benefit from PT services to address deficits in strength, balance, and mobility in order to return to full function at home.    Personal Factors and Comorbidities  Age;Comorbidity 1;Comorbidity 2;Comorbidity 3+    Comorbidities  CVA, CAD, HTN    Examination-Activity Limitations  Bend;Transfers    Examination-Participation Restrictions  Community Activity    Rehab Potential  Good    PT Frequency  2x / week    PT Duration  12 weeks    PT Treatment/Interventions  ADLs/Self Care Home Management;Aquatic Therapy;Biofeedback;Canalith Repostioning;Cryotherapy;Electrical Stimulation;Iontophoresis 5m/ml Dexamethasone;Moist Heat;Traction;Ultrasound;DME Instruction;Gait  training;Stair training;Functional mobility training;Therapeutic activities;Therapeutic  exercise;Balance training;Neuromuscular re-education;Patient/family education;Manual techniques;Passive range of motion;Dry needling;Vestibular    PT Next Visit Plan  Progress strength and balance, eventually get new order to address back pain    PT Home Exercise Plan  Medbridge Access Code: [email or web address]    Consulted and Agree with Plan of Care  Patient       Patient will benefit from skilled therapeutic intervention in order to improve the following deficits and impairments:  Abnormal gait, Decreased balance, Difficulty walking, Decreased strength  Visit Diagnosis: 1. Muscle weakness (generalized)   2. Other abnormalities of gait and mobility   3. Unsteadiness on feet        Problem List Patient Active Problem List   Diagnosis Date Noted  . Stenosis of right carotid artery 02/01/2017  . Acute embolic stroke ([place]) 02/40/9735  . Bradycardia 11/10/2015  . Essential tremor 06/03/2015  . Dizziness 08/21/2012  . Syncope 04/10/2011  . CALF PAIN, LEFT 10/11/2010  . DYSPEPSIA 07/31/2010  . CHEST DISCOMFORT 07/31/2010  . Hyperlipidemia 05/16/2010  . HYPERTENSION, BENIGN 05/16/2010  . CAD, NATIVE VESSEL 05/16/2010   This entire session was performed under direct supervision and direction of a licensed therapist/therapist assistant . I have personally read, edited and approve of the note as written.   Elmyra Ricks Joanie Duprey SPT Phillips Grout PT, DPT, GCS  Huprich,Jason 06/18/2019, 3:33 PM  [place] MAIN [facility] SERVICES [address] [place], [place], [address] Phone: [phone]   Fax:  [phone]  Name: Lacorey Brusca [facility]. MRN: [id number] Date of Birth: 23-Nov-1933

## 2019-06-22 ENCOUNTER — Ambulatory Visit (INDEPENDENT_AMBULATORY_CARE_PROVIDER_SITE_OTHER): Payer: Medicare Other | Admitting: *Deleted

## 2019-06-22 DIAGNOSIS — I639 Cerebral infarction, unspecified: Secondary | ICD-10-CM | POA: Diagnosis not present

## 2019-06-22 LAB — CUP PACEART REMOTE DEVICE CHECK
Date Time Interrogation Session: 20200720163715
Implantable Pulse Generator Implant Date: 20180612

## 2019-06-23 ENCOUNTER — Other Ambulatory Visit: Payer: Self-pay

## 2019-06-23 ENCOUNTER — Ambulatory Visit: Payer: Medicare Other

## 2019-06-23 DIAGNOSIS — R2689 Other abnormalities of gait and mobility: Secondary | ICD-10-CM

## 2019-06-23 DIAGNOSIS — M6281 Muscle weakness (generalized): Secondary | ICD-10-CM | POA: Diagnosis not present

## 2019-06-23 DIAGNOSIS — M25551 Pain in right hip: Secondary | ICD-10-CM

## 2019-06-23 DIAGNOSIS — R278 Other lack of coordination: Secondary | ICD-10-CM

## 2019-06-23 DIAGNOSIS — R2681 Unsteadiness on feet: Secondary | ICD-10-CM

## 2019-06-23 NOTE — Therapy (Signed)
[place] [facility] SERVICES [address] [place], [place], [address] Phone: [phone]   Fax:  [phone]  Physical Therapy Treatment  Patient Details  Name: [name]. MRN: [id number] Date of Birth: 06-18-1933 Referring Provider (PT): Dr. Marry Guan   Encounter Date: 06/23/2019  PT End of Session - 06/23/19 0925    Visit Number  16    Number of Visits  59    Date for PT Re-Evaluation  07/28/19    Authorization Type  Eval: 02/16/19 (break following for COVID), goals updated 06/02/19    PT Start Time  0930    PT Stop Time  1015    PT Time Calculation (min)  45 min    Equipment Utilized During Treatment  Gait belt    Activity Tolerance  Patient tolerated treatment well    Behavior During Therapy  WFL for tasks assessed/performed       Past Medical History:  Diagnosis Date  . Acute embolic stroke ([place]) 01/09/6700  . BPH (benign prostatic hyperplasia)   . Bradycardia 11/10/2015  . Coronary artery disease   . Dizziness 08/21/2012  . DYSPEPSIA 07/31/2010   Qualifier: Diagnosis of  By: [name]    . Hyperlipidemia 05/16/2010   Qualifier: Diagnosis of  By: [name], [name]     . Hypertension   . HYPERTENSION, BENIGN 05/16/2010   Qualifier: Diagnosis of  By: [name], [name]    . Stenosis of right carotid artery 02/01/2017    Past Surgical History:  Procedure Laterality Date  . HIP SURGERY     right hip   . Intestinal Blockage    . LOOP RECORDER INSERTION N/A 05/14/2017   Procedure: Loop Recorder Insertion;  Surgeon: [name], MD;  Location: [facility];  Service: Cardiovascular;  Laterality: N/A;  . SKIN CANCER EXCISION     x2  . TONSILLECTOMY      There were no vitals filed for this visit.  Subjective Assessment - 06/23/19 0925    Subjective  Pt presents to PT today motivated and reports doing well. Pt reports LLE 3/10 pain this morning prior to PT. Pt reports noncompliance with HEP due to working  in yard so much that he doesn't have the energy to do exercises. Pt reports feeling good after the last session and has no questions or concerns at this time.    Pertinent History  The patient is a 85 y.o. male who was referred for physical therapy to treat his right hip. He is over 3 months status post percutaneous pinning of a right femoral neck fracture performed by [name] in [place]. Fall occurred on 10/25/18. After the surgery he went to SNF for 1 week and then discharged to his daughters house for 6 weeks. After returning home to [place] he started PT at an outpatient clinic in [place] and then transferred to [facility]. He was working with physical therapy at [facility] for "multiple weeks." He had scheduling issues with [facility] so he is here at [facility] for an evaluation. He is currently using a single point cane for ambulation for the last month. He would like to transfer off of his cane to no assistive device. He has been weightbearing as tolerated for some time.  He denies any significant resting groin or thigh pain upon arrival but does have some intermittent anterior and posterior hip pain on occasion. History of CVA 2 years ago but pt denies any  residual deficits. Otherwise he denies any specific questions or concerns. Lives with wife in multilevel house but can stay on main floor, 2 steps to enter with L railing. No reported trouble with stairs. Independent with ADLs/IADLs and drives.    Patient Stated Goals  "I want to walk without a cane." Pt would like to improve his strength, gait, and balance    Currently in Pain?  Yes    Pain Score  3     Pain Location  Leg    Pain Orientation  Left    Pain Descriptors / Indicators  Aching    Pain Onset  More than a month ago        TREATMENT   Therapeutic Exercise Octane L6x5 minutes for warmup throughout subjective; pt performs without UE. Cable walks 7.5# fwd, back, side step x3 each direction  RDL golfer lift with  cone pickup as visual cue to increase SLS time; attempted not completed  Gait in hallway forward with speed changes 4x75'; noticeable fatigue  STS in regular height chair without UE support for speed 1x10, 1x6; pt heavily fatigued during second set demonstrated by decrease in speed and voluntarily stopping the exercise.     Neuromuscular Re-education Standing airex cone taps half circle with consecutive taps to increase single leg stance time for speed attempted without UE support x10 each LE Step fwd/backward over hurdle x2 consecutive hurdles each LE; too easy for pt Side stepping over 2-consecutive hurdles x2 each LE; too easy for pt, added in airex on both sides of hurdles to challenge stability and pt completed with ease  Lateral step up onto 6"step with airex on top and step down on contralateral side x10 each  Step downs from first step at stairs for eccentric control 2x10 each LE ; min-modA when LOB      Pt educated throughout session about proper posture and technique with exercises. Improved exercise technique, movement at target joints, use of target muscles after min to mod verbal, visual, tactile cues.     Pt demonstrates motivation at today's physical therapy session. Pt demonstrates fatigue with speed related exercises with increased time to complete task and requiring intermittent rest breaks. He continues to demonstrate weakness in single leg stance needing UE support throughout and intermittent toe touch down for steadying during airex cone taps. Pt demonstrates difficulty with eccentric control of a step down needing continual CGA and minA when LOB. He has a delayed compensatory reaction with LOB and only reacts using UE for support rather than taking a step to stabilize. He demonstrates weakness with hip extension and difficulty with anterior weight shift during sit to stands and anytime he experiences LOB posteriorly. Pt had difficulty understanding how to perform golfer's lift.  The exercise was attempted x5 for increased time in single leg stance and hip extension strength and stability, however he was unable to understand how to do the exercise despite verbal/tactile cues and demonstration multiple times. He will benefit from PT services to address deficits in strength, balance, and mobility in order to return to full function at home.          PT Education - 06/23/19 0925    Education Details  technique/form throughout exercises, importance of HEP    Person(s) Educated  Patient    Methods  Explanation;Verbal cues;Tactile cues;Demonstration    Comprehension  Verbalized understanding;Returned demonstration;Verbal cues required       PT Short Term Goals - 06/02/19 4854      PT  SHORT TERM GOAL #1   Title  Pt will be independent with HEP in order to decrease hip pain and increase strength in order to improve pain-free function at home and work.    Time  6    Period  Weeks    Status  On-going    Target Date  06/16/19        PT Long Term Goals - 06/02/19 0934      PT LONG TERM GOAL #1   Title  Pt will increase LEFS by at least 9 points in order to demonstrate significant improvement in lower extremity function.      Baseline  02/12/19: 58/80; 05/05/19: 33/80; 06/03/19: 46/80    Time  12    Period  Weeks    Status  On-going    Target Date  07/28/19      PT LONG TERM GOAL #2   Title  Pt will decrease worst R hip pain as reported on NPRS by at least 3 points in order to demonstrate clinically significant reduction in ankle/foot pain.     Baseline  02/12/19: worst: 4/10; 05/05/19: 2/10; 06/02/19: "1-2/10 maybe once over the last couple weeks"    Time  12    Period  Weeks    Status  Partially Met    Target Date  07/28/19      PT LONG TERM GOAL #3   Title  Pt will increase strength of R hip extension and abduction by at least 1/2 MMT grade in order to demonstrate improvement in strength and function    Baseline  02/12/19: R hip extension 3/5, R hip abduction:  4-/5; 05/05/19: unchanged; 06/03/19: unchanged    Time  12    Period  Weeks    Status  On-going    Target Date  07/28/19      PT LONG TERM GOAL #4   Title  Pt will increase self-selected 10MWT by at least 0.13 m/s in order to demonstrate clinically significant improvement in community ambulation.    Baseline  02/12/19: 0.81 m/s; 05/05/19: 0.66 m/s; 06/03/19: 0.86 m/s    Time  12    Period  Weeks    Status  New    Target Date  07/28/19            Plan - 06/23/19 6962    Clinical Impression Statement  Pt demonstrates motivation at today's physical therapy session. Pt demonstrates fatigue with speed related exercises with increased time to complete task and requiring intermittent rest breaks. He continues to demonstrate weakness in single leg stance needing UE support throughout and intermittent toe touch down for steadying during airex cone taps. Pt demonstrates difficulty with eccentric control of a step down needing continual CGA and minA when LOB. He has a delayed compensatory reaction with LOB and only reacts using UE for support rather than taking a step to stabilize. He demonstrates weakness with hip extension and difficulty with anterior weight shift during sit to stands and anytime he experiences LOB posteriorly. Pt had difficulty understanding how to perform golfer's lift. The exercise was attempted x5 for increased time in single leg stance and hip extension strength and stability, however he was unable to understand how to do the exercise despite verbal/tactile cues and demonstration multiple times. He will benefit from PT services to address deficits in strength, balance, and mobility in order to return to full function at home.    Personal Factors and Comorbidities  Age;Comorbidity 1;Comorbidity 2;Comorbidity 3+  Comorbidities  CVA, CAD, HTN    Examination-Activity Limitations  Bend;Transfers    Examination-Participation Restrictions  Community Activity    Rehab Potential  Good    PT  Frequency  2x / week    PT Duration  12 weeks    PT Treatment/Interventions  ADLs/Self Care Home Management;Aquatic Therapy;Biofeedback;Canalith Repostioning;Cryotherapy;Electrical Stimulation;Iontophoresis 71m/ml Dexamethasone;Moist Heat;Traction;Ultrasound;DME Instruction;Gait training;Stair training;Functional mobility training;Therapeutic activities;Therapeutic exercise;Balance training;Neuromuscular re-education;Patient/family education;Manual techniques;Passive range of motion;Dry needling;Vestibular    PT Next Visit Plan  Progress strength and balance, eventually get new order to address back pain    PT Home Exercise Plan  Medbridge Access Code: [email or web address]    Consulted and Agree with Plan of Care  Patient       Patient will benefit from skilled therapeutic intervention in order to improve the following deficits and impairments:  Abnormal gait, Decreased balance, Difficulty walking, Decreased strength  Visit Diagnosis: 1. Muscle weakness (generalized)   2. Other abnormalities of gait and mobility   3. Unsteadiness on feet   4. Pain in right hip   5. Other lack of coordination        Problem List Patient Active Problem List   Diagnosis Date Noted  . Stenosis of right carotid artery 02/01/2017  . [place] 099/24/2683 . Bradycardia 11/10/2015  . Essential tremor 06/03/2015  . Dizziness 08/21/2012  . Syncope 04/10/2011  . CALF PAIN, LEFT 10/11/2010  . DYSPEPSIA 07/31/2010  . CHEST DISCOMFORT 07/31/2010  . Hyperlipidemia 05/16/2010  . HYPERTENSION, BENIGN 05/16/2010  . CAD, NATIVE VESSEL 05/16/2010    This entire session was performed under direct supervision and direction of a licensed therapist/therapist assistant . I have personally read, edited and approve of the note as written.    NElmyra RicksTurcotte SPT JPhillips GroutPT, DPT, GCS  Huprich,Jason 06/23/2019, 3:57 PM  [facility] CenterSERV[facility] [address] [place] [address]Phone: [phone]  Fax:  [phone] Name: GQais JowersBBay [facility] Surg[facility] MRN: [id number] of Birth: [birth year]

## 2019-06-25 ENCOUNTER — Ambulatory Visit: Payer: Medicare Other

## 2019-06-25 ENCOUNTER — Other Ambulatory Visit: Payer: Self-pay

## 2019-06-25 DIAGNOSIS — R2689 Other abnormalities of gait and mobility: Secondary | ICD-10-CM

## 2019-06-25 DIAGNOSIS — M6281 Muscle weakness (generalized): Secondary | ICD-10-CM | POA: Diagnosis not present

## 2019-06-25 DIAGNOSIS — R2681 Unsteadiness on feet: Secondary | ICD-10-CM

## 2019-06-25 DIAGNOSIS — R278 Other lack of coordination: Secondary | ICD-10-CM

## 2019-06-25 NOTE — Therapy (Signed)
[place] MAIN [facility] SERVICES [address] [place], [place], [address] Phone: [phone]   Fax:  [phone]  Physical Therapy Treatment  Patient Details  Name: [name]. MRN: [id number] Date of Birth: [birth year] Referring Provider (PT): Dr. Marry Guan   Encounter Date: 06/25/2019  PT End of Session - 06/25/19 0941    Visit Number  17    Number of Visits  37    Date for PT Re-Evaluation  07/28/19    Authorization Type  Eval: 02/16/19 (break following for COVID), goals updated 06/02/19    PT Start Time  0934    PT Stop Time  1015    PT Time Calculation (min)  41 min    Equipment Utilized During Treatment  Gait belt    Activity Tolerance  Patient tolerated treatment well    Behavior During Therapy  WFL for tasks assessed/performed       Past Medical History:  Diagnosis Date  . Acute embolic stroke ([place]) 12/09/9148  . BPH (benign prostatic hyperplasia)   . Bradycardia 11/10/2015  . Coronary artery disease   . Dizziness 08/21/2012  . DYSPEPSIA 07/31/2010   Qualifier: Diagnosis of  By: [name]    . Hyperlipidemia 05/16/2010   Qualifier: Diagnosis of  By: [name], [name]     . Hypertension   . HYPERTENSION, BENIGN 05/16/2010   Qualifier: Diagnosis of  By: [name], [name]    . Stenosis of right carotid artery 02/01/2017    Past Surgical History:  Procedure Laterality Date  . HIP SURGERY     right hip   . Intestinal Blockage    . LOOP RECORDER INSERTION N/A 05/14/2017   Procedure: Loop Recorder Insertion;  Surgeon: [name], MD;  Location: [facility];  Service: Cardiovascular;  Laterality: N/A;  . SKIN CANCER EXCISION     x2  . TONSILLECTOMY      There were no vitals filed for this visit.  Subjective Assessment - 06/25/19 0937    Subjective  Pt reports feeling good today. Pt reports feeling sore for a day after the last session and was fatigued.    Pertinent History  The patient is a 85 y.o. male  who was referred for physical therapy to treat his right hip. He is over 3 months status post percutaneous pinning of a right femoral neck fracture performed by [name] in [place]. Fall occurred on 10/25/18. After the surgery he went to SNF for 1 week and then discharged to his daughters house for 6 weeks. After returning home to [place] he started PT at an outpatient clinic in [place] and then transferred to [facility]. He was working with physical therapy at [facility] for "multiple weeks." He had scheduling issues with [facility] so he is here at [facility] for an evaluation. He is currently using a single point cane for ambulation for the last month. He would like to transfer off of his cane to no assistive device. He has been weightbearing as tolerated for some time.  He denies any significant resting groin or thigh pain upon arrival but does have some intermittent anterior and posterior hip pain on occasion. History of CVA 2 years ago but pt denies any residual deficits. Otherwise he denies any specific questions or concerns. Lives with wife in multilevel house but can stay on main floor, 2 steps to enter with L railing. No reported trouble with stairs. Independent with ADLs/IADLs and drives.  Patient Stated Goals  "I want to walk without a cane." Pt would like to improve his strength, gait, and balance    Currently in Pain?  Yes    Pain Score  2     Pain Location  Leg    Pain Orientation  Left    Pain Descriptors / Indicators  Aching;Sore    Pain Onset  More than a month ago         TREATMENT   Therapeutic Exercise Octane L6x5 minutes for warmup throughout subjective; pt performs without UE.  Performed MiniBestest: 18/28 Treadmill walking with gait speed changes x 5 minutes with BUE support      Pt educated throughout session about proper posture and technique with exercises. Improved exercise technique, movement at target joints, use of target muscles after min to mod  verbal, visual, tactile cues.      Pt demonstrates motivation at today's physical therapy session. He tolerated gait speed changes on the treadmill well, however requiring bilateral upper extremity support. Pt was capable of no upper extremity support during slow phases on gait speed but demonstrates shortened stride length and increased cadence. PT assessment revealed difficulties with single leg standing, compensatory strategies in all directions, balance on unstable surfaces, and dual tasking. He demonstrates ability to achieve single leg stance with multiple attempts but requires CGA and occassionally minA when LOB. He demonstrates min compensatory strategies requiring BUE support and multiple steps as well as minA to regain balance to prevent falls. Pt demonstrates difficulty with dual tasking demonstrated by ceasing gait during timed up and go with a cognitive task and increased time total with a dual task cost of 327%. At next session, pt will benefit from therapeutic exercises that focus on single leg stance, developing compensatory strategies, and activities with the addition of a cognitive task. He will benefit from PT services to address deficits in strength, balance, and mobility in order to return to full function at home.      [facility] PT Assessment - 06/25/19 1023      Standardized Balance Assessment   Standardized Balance Assessment  Mini-BESTest      Mini-BESTest   Sit To Stand  Normal: Comes to stand without use of hands and stabilizes independently.    Rise to Toes  Moderate: Heels up, but not full range (smaller than when holding hands), OR noticeable instability for 3 s.    Stand on one leg (left)  Moderate: < 20 s    Stand on one leg (right)  Moderate: < 20 s    Stand on one leg - lowest score  1    Compensatory Stepping Correction - Forward  Moderate: More than one step is required to recover equilibrium    Compensatory Stepping Correction - Backward  Moderate: More than one  step is required to recover equilibrium    Compensatory Stepping Correction - Left Lateral  Moderate: Several steps to recover equilibrium    Compensatory Stepping Correction - Right Lateral  Moderate: Several steps to recover equilibrium    Stepping Corredtion Lateral - lowest score  1    Stance - Feet together, eyes open, firm surface   Normal: 30s    Stance - Feet together, eyes closed, foam surface   Moderate: < 30s    Incline - Eyes Closed  Normal: Stands independently 30s and aligns with gravity    Change in Gait Speed  Moderate: Unable to change walking speed or signs of imbalance    Walk  with head turns - Horizontal  Normal: performs head turns with no change in gait speed and good balance    Walk with pivot turns  Moderate:Turns with feet close SLOW (>4 steps) with good balance.    Step over obstacles  Normal: Able to step over box with minimal change of gait speed and with good balance.    Timed UP & GO with Dual Task  Severe: Stops counting while walking OR stops walking while counting.    Mini-BEST total score  18      Timed Up and Go Test   Normal TUG (seconds)  10.91    Cognitive TUG (seconds)  46.55            PT Short Term Goals - 06/02/19 0933      PT SHORT TERM GOAL #1   Title  Pt will be independent with HEP in order to decrease hip pain and increase strength in order to improve pain-free function at home and work.    Time  6    Period  Weeks    Status  On-going    Target Date  06/16/19        PT Long Term Goals - 06/02/19 0934      PT LONG TERM GOAL #1   Title  Pt will increase LEFS by at least 9 points in order to demonstrate significant improvement in lower extremity function.      Baseline  02/12/19: 58/80; 05/05/19: 33/80; 06/03/19: 46/80    Time  12    Period  Weeks    Status  On-going    Target Date  07/28/19      PT LONG TERM GOAL #2   Title  Pt will decrease worst R hip pain as reported on NPRS by at least 3 points in order to demonstrate  clinically significant reduction in ankle/foot pain.     Baseline  02/12/19: worst: 4/10; 05/05/19: 2/10; 06/02/19: "1-2/10 maybe once over the last couple weeks"    Time  12    Period  Weeks    Status  Partially Met    Target Date  07/28/19      PT LONG TERM GOAL #3   Title  Pt will increase strength of R hip extension and abduction by at least 1/2 MMT grade in order to demonstrate improvement in strength and function    Baseline  02/12/19: R hip extension 3/5, R hip abduction: 4-/5; 05/05/19: unchanged; 06/03/19: unchanged    Time  12    Period  Weeks    Status  On-going    Target Date  07/28/19      PT LONG TERM GOAL #4   Title  Pt will increase self-selected 10MWT by at least 0.13 m/s in order to demonstrate clinically significant improvement in community ambulation.    Baseline  02/12/19: 0.81 m/s; 05/05/19: 0.66 m/s; 06/03/19: 0.86 m/s    Time  12    Period  Weeks    Status  New    Target Date  07/28/19            Plan - 06/25/19 0941    Clinical Impression Statement  Pt demonstrates motivation at today's physical therapy session. He tolerated gait speed changes on the treadmill well, however requiring bilateral upper extremity support. Pt was capable of no upper extremity support during slow phases on gait speed but demonstrates shortened stride length and increased cadence. PT assessment revealed difficulties with single leg standing, compensatory strategies  in all directions, balance on unstable surfaces, and dual tasking. He demonstrates ability to achieve single leg stance with multiple attempts but requires CGA and occassionally minA when LOB. He demonstrates min compensatory strategies requiring BUE support and multiple steps as well as minA to regain balance to prevent falls. Pt demonstrates difficulty with dual tasking demonstrated by ceasing gait during timed up and go with a cognitive task and increased time total with a dual task cost of 327%. At next session, pt will benefit from  therapeutic exercises that focus on single leg stance, developing compensatory strategies, and activities with the addition of a cognitive task. He will benefit from PT services to address deficits in strength, balance, and mobility in order to return to full function at home.    Personal Factors and Comorbidities  Age;Comorbidity 1;Comorbidity 2;Comorbidity 3+    Comorbidities  CVA, CAD, HTN    Examination-Activity Limitations  Bend;Transfers    Examination-Participation Restrictions  Community Activity    Rehab Potential  Good    PT Frequency  2x / week    PT Duration  12 weeks    PT Treatment/Interventions  ADLs/Self Care Home Management;Aquatic Therapy;Biofeedback;Canalith Repostioning;Cryotherapy;Electrical Stimulation;Iontophoresis 34m/ml Dexamethasone;Moist Heat;Traction;Ultrasound;DME Instruction;Gait training;Stair training;Functional mobility training;Therapeutic activities;Therapeutic exercise;Balance training;Neuromuscular re-education;Patient/family education;Manual techniques;Passive range of motion;Dry needling;Vestibular    PT Next Visit Plan  Progress strength and balance, eventually get new order to address back pain    PT Home Exercise Plan  Medbridge Access Code: [email or web address]    Consulted and Agree with Plan of Care  Patient       Patient will benefit from skilled therapeutic intervention in order to improve the following deficits and impairments:  Abnormal gait, Decreased balance, Difficulty walking, Decreased strength  Visit Diagnosis: 1. Muscle weakness (generalized)   2. Other abnormalities of gait and mobility   3. Unsteadiness on feet   4. Other lack of coordination        Problem List Patient Active Problem List   Diagnosis Date Noted  . Stenosis of right carotid artery 02/01/2017  . [place]olic [place] ([place] 067/34/1937 . Bradycardia 11/10/2015  . Essential tremor 06/03/2015  . Dizziness 08/21/2012  . Syncope 04/10/2011  . CALF PAIN, LEFT 10/11/2010  .  DYSPEPSIA 07/31/2010  . CHEST DISCOMFORT 07/31/2010  . Hyperlipidemia 05/16/2010  . HYPERTENSION, BENIGN 05/16/2010  . CAD, NATIVE VESSEL 05/16/2010    This entire session was performed under direct supervision and direction of a licensed therapist/therapist assistant . I have personally read, edited and approve of the note as written.    NElmyra RicksTurcotte SPT JPhillips GroutPT, DPT, GCS  Huprich,Jason 06/25/2019, 4:21 PM  [place] [facility] [address] [place] [address]Phone: [phone]  Fax:  [phone] Name: GHillard GoodwineBKalkaska Memorial [facility] Center MRN: [id number] of Birth: [birth year]

## 2019-06-30 ENCOUNTER — Other Ambulatory Visit: Payer: Self-pay

## 2019-06-30 ENCOUNTER — Ambulatory Visit: Payer: Medicare Other

## 2019-06-30 DIAGNOSIS — M6281 Muscle weakness (generalized): Secondary | ICD-10-CM | POA: Diagnosis not present

## 2019-06-30 DIAGNOSIS — R2681 Unsteadiness on feet: Secondary | ICD-10-CM

## 2019-06-30 DIAGNOSIS — R2689 Other abnormalities of gait and mobility: Secondary | ICD-10-CM

## 2019-06-30 DIAGNOSIS — M25551 Pain in right hip: Secondary | ICD-10-CM

## 2019-06-30 NOTE — Therapy (Signed)
[place] MAIN [facility] SERVICES [address] [place], [place], [address] Phone: [phone]   Fax:  [phone]  Physical Therapy Treatment  Patient Details  Name: [name]. MRN: [id number] Date of Birth: 1933/02/20 Referring Provider (PT): Dr. Marry Guan   Encounter Date: 06/30/2019  PT End of Session - 06/30/19 0938    Visit Number  18    Number of Visits  42    Date for PT Re-Evaluation  07/28/19    Authorization Type  Eval: 02/16/19 (break following for COVID), goals updated 06/02/19    PT Start Time  0931    PT Stop Time  1015    PT Time Calculation (min)  44 min    Equipment Utilized During Treatment  Gait belt    Activity Tolerance  Patient tolerated treatment well    Behavior During Therapy  WFL for tasks assessed/performed       Past Medical History:  Diagnosis Date  . Acute embolic stroke ([place]) 02/04/7424  . BPH (benign prostatic hyperplasia)   . Bradycardia 11/10/2015  . Coronary artery disease   . Dizziness 08/21/2012  . DYSPEPSIA 07/31/2010   Qualifier: Diagnosis of  By: [name]    . Hyperlipidemia 05/16/2010   Qualifier: Diagnosis of  By: [name], [name]     . Hypertension   . HYPERTENSION, BENIGN 05/16/2010   Qualifier: Diagnosis of  By: [name], [name]    . Stenosis of right carotid artery 02/01/2017    Past Surgical History:  Procedure Laterality Date  . HIP SURGERY     right hip   . Intestinal Blockage    . LOOP RECORDER INSERTION N/A 05/14/2017   Procedure: Loop Recorder Insertion;  Surgeon: [name], MD;  Location: [facility];  Service: Cardiovascular;  Laterality: N/A;  . SKIN CANCER EXCISION     x2  . TONSILLECTOMY      There were no vitals filed for this visit.  Subjective Assessment - 06/30/19 0930    Subjective  Pt reports feeling good today with no questions or concerns at this time. He reports no increase in pain or soreness since last PT session.    Pertinent  History  The patient is a 85 y.o. male who was referred for physical therapy to treat his right hip. He is over 3 months status post percutaneous pinning of a right femoral neck fracture performed by [name] in [place]. Fall occurred on 10/25/18. After the surgery he went to SNF for 1 week and then discharged to his daughters house for 6 weeks. After returning home to [place] he started PT at an outpatient clinic in [place] and then transferred to [facility]. He was working with physical therapy at [facility] for "multiple weeks." He had scheduling issues with [facility] so he is here at [facility] for an evaluation. He is currently using a single point cane for ambulation for the last month. He would like to transfer off of his cane to no assistive device. He has been weightbearing as tolerated for some time.  He denies any significant resting groin or thigh pain upon arrival but does have some intermittent anterior and posterior hip pain on occasion. History of CVA 2 years ago but pt denies any residual deficits. Otherwise he denies any specific questions or concerns. Lives with wife in multilevel house but can stay on main floor, 2 steps to enter with L railing. No reported trouble with  stairs. Independent with ADLs/IADLs and drives.    Patient Stated Goals  "I want to walk without a cane." Pt would like to improve his strength, gait, and balance    Currently in Pain?  No/denies    Pain Onset  --         TREATMENT  Therapeutic Exercise Quantum leg press bilateral 105# x20 STS in regular height chair without UE support 2x10      Neuromuscular Re-education Squats on airex 2x12 Standing airex hedge hog toe taps with consecutive taps to increase single leg stance time without UE support alt LE x40mns   Standing perturbations on firm surface feet together and airex feet apart and feet together x30s each  Compensatory strategy training forward, backward, laterally x5 each direction;  minA required d/t instability taking numerous steps and utilizing bilateral UE support  Standing airex toe taps on 6" step for speed 2x30s each LE     Pt educated throughout session about proper posture and technique with exercises. Improved exercise technique, movement at target joints, use of target muscles after min to mod verbal, visual, tactile cues.      Pt demonstrates motivation at today's physical therapy session. Pt took an extended bathroom break at the beginning of the session prior to beginning exercises. He demonstrates improvement in eccentric control during STS without UE support from a regular height chair. Pt warmed up with low weight bilateral leg press however did not progress due to pain and discomfort increasing to 3/10 on L knee. Pt demonstrates fatigue with speed related exercises with increased time to complete task and requiring intermittent rest breaks. He has a delayed compensatory reaction with LOB and primarily reacts using UE for support rather than taking a step to stabilize. After about 3 repetitions of compensatory strategy training, he demonstrates improvement only needing to take 1-2 steps to steady. He demonstrates improvement in all exercises with practice. Pt requires CGA during exercises for safety. He can recover himself from a loss of balance but depends heavily on UE support prior to taking a step and PT for minA once fatigued. He will benefit from PT services to address deficits in strength, balance, and mobility in order to return to full function at home.      PT Education - 06/30/19 [phone]   Education Details  technique and form throughout exercises    Person(s) Educated  Patient    Methods  Explanation;Demonstration;Tactile cues;Verbal cues    Comprehension  Verbal cues required;Returned demonstration;Verbalized understanding       PT Short Term Goals - 06/02/19 0933      PT SHORT TERM GOAL #1   Title  Pt will be independent with HEP in order to  decrease hip pain and increase strength in order to improve pain-free function at home and work.    Time  6    Period  Weeks    Status  On-going    Target Date  06/16/19        PT Long Term Goals - 06/02/19 0934      PT LONG TERM GOAL #1   Title  Pt will increase LEFS by at least 9 points in order to demonstrate significant improvement in lower extremity function.      Baseline  02/12/19: 58/80; 05/05/19: 33/80; 06/03/19: 46/80    Time  12    Period  Weeks    Status  On-going    Target Date  07/28/19      PT LONG  TERM GOAL #2   Title  Pt will decrease worst R hip pain as reported on NPRS by at least 3 points in order to demonstrate clinically significant reduction in ankle/foot pain.     Baseline  02/12/19: worst: 4/10; 05/05/19: 2/10; 06/02/19: "1-2/10 maybe once over the last couple weeks"    Time  12    Period  Weeks    Status  Partially Met    Target Date  07/28/19      PT LONG TERM GOAL #3   Title  Pt will increase strength of R hip extension and abduction by at least 1/2 MMT grade in order to demonstrate improvement in strength and function    Baseline  02/12/19: R hip extension 3/5, R hip abduction: 4-/5; 05/05/19: unchanged; 06/03/19: unchanged    Time  12    Period  Weeks    Status  On-going    Target Date  07/28/19      PT LONG TERM GOAL #4   Title  Pt will increase self-selected 10MWT by at least 0.13 m/s in order to demonstrate clinically significant improvement in community ambulation.    Baseline  02/12/19: 0.81 m/s; 05/05/19: 0.66 m/s; 06/03/19: 0.86 m/s    Time  12    Period  Weeks    Status  New    Target Date  07/28/19            Plan - 06/30/19 7341    Clinical Impression Statement  Pt demonstrates motivation at today's physical therapy session. Pt took an extended bathroom break at the beginning of the session prior to beginning exercises. He demonstrates improvement in eccentric control during STS without UE support from a regular height chair. Pt warmed up with  low weight bilateral leg press however did not progress due to pain and discomfort increasing to 3/10 on L knee. Pt demonstrates fatigue with speed related exercises with increased time to complete task and requiring intermittent rest breaks. He has a delayed compensatory reaction with LOB and primarily reacts using UE for support rather than taking a step to stabilize. After about 3 repetitions of compensatory strategy training, he demonstrates improvement only needing to take 1-2 steps to steady. He demonstrates improvement in all exercises with practice. Pt requires CGA during exercises for safety. He can recover himself from a loss of balance but depends heavily on UE support prior to taking a step and PT for minA once fatigued. He will benefit from PT services to address deficits in strength, balance, and mobility in order to return to full function at home.    Personal Factors and Comorbidities  Age;Comorbidity 1;Comorbidity 2;Comorbidity 3+    Comorbidities  CVA, CAD, HTN    Examination-Activity Limitations  Bend;Transfers    Examination-Participation Restrictions  Community Activity    Rehab Potential  Good    PT Frequency  2x / week    PT Duration  12 weeks    PT Treatment/Interventions  ADLs/Self Care Home Management;Aquatic Therapy;Biofeedback;Canalith Repostioning;Cryotherapy;Electrical Stimulation;Iontophoresis 60m/ml Dexamethasone;Moist Heat;Traction;Ultrasound;DME Instruction;Gait training;Stair training;Functional mobility training;Therapeutic activities;Therapeutic exercise;Balance training;Neuromuscular re-education;Patient/family education;Manual techniques;Passive range of motion;Dry needling;Vestibular    PT Next Visit Plan  Progress strength and balance, eventually get new order to address back pain    PT Home Exercise Plan  Medbridge Access Code: [email or web address]    Consulted and Agree with Plan of Care  Patient       Patient will benefit from skilled therapeutic intervention in order  to improve the following deficits and  impairments:  Abnormal gait, Decreased balance, Difficulty walking, Decreased strength  Visit Diagnosis: 1. Muscle weakness (generalized)   2. Other abnormalities of gait and mobility   3. Unsteadiness on feet   4. Pain in right hip        Problem List Patient Active Problem List   Diagnosis Date Noted  . Stenosis of right carotid artery 02/01/2017  . Acute embolic stroke ([place]) 33/35/4562  . Bradycardia 11/10/2015  . Essential tremor 06/03/2015  . Dizziness 08/21/2012  . Syncope 04/10/2011  . CALF PAIN, LEFT 10/11/2010  . DYSPEPSIA 07/31/2010  . CHEST DISCOMFORT 07/31/2010  . Hyperlipidemia 05/16/2010  . HYPERTENSION, BENIGN 05/16/2010  . CAD, NATIVE VESSEL 05/16/2010    This entire session was performed under direct supervision and direction of a licensed therapist/therapist assistant . I have personally read, edited and approve of the note as written.   Elmyra Ricks Ed Rayson SPT Phillips Grout PT, DPT, GCS  Huprich,Jason 06/30/2019, 1:08 PM  [place] MAIN [facility] SERVICES [address] [place], [place], [address] Phone: [phone]   Fax:  [phone]  Name: [name] [facility]. MRN: [id number] Date of Birth: [birth year]

## 2019-07-02 ENCOUNTER — Ambulatory Visit: Payer: Medicare Other

## 2019-07-02 ENCOUNTER — Other Ambulatory Visit: Payer: Self-pay

## 2019-07-02 DIAGNOSIS — R2689 Other abnormalities of gait and mobility: Secondary | ICD-10-CM

## 2019-07-02 DIAGNOSIS — R278 Other lack of coordination: Secondary | ICD-10-CM

## 2019-07-02 DIAGNOSIS — M25551 Pain in right hip: Secondary | ICD-10-CM

## 2019-07-02 DIAGNOSIS — R2681 Unsteadiness on feet: Secondary | ICD-10-CM

## 2019-07-02 DIAGNOSIS — M6281 Muscle weakness (generalized): Secondary | ICD-10-CM

## 2019-07-02 NOTE — Therapy (Signed)
[place] MAIN [facility] SERVICES [address] [place], [place], [address] Phone: [phone]   Fax:  [phone]  Physical Therapy Treatment  Patient Details  Name: [name]. MRN: [id number] Date of Birth: [birth year] Referring Provider (PT): Dr. Marry Guan   Encounter Date: 07/02/2019  PT End of Session - 07/02/19 0938    Visit Number  19    Number of Visits  11    Date for PT Re-Evaluation  07/28/19    Authorization Type  Eval: 02/16/19 (break following for COVID), goals updated 06/02/19    PT Start Time  0930    PT Stop Time  1015    PT Time Calculation (min)  45 min    Equipment Utilized During Treatment  Gait belt    Activity Tolerance  Patient tolerated treatment well    Behavior During Therapy  WFL for tasks assessed/performed       Past Medical History:  Diagnosis Date  . Acute embolic stroke ([place]) 0/06/6225  . BPH (benign prostatic hyperplasia)   . Bradycardia 11/10/2015  . Coronary artery disease   . Dizziness 08/21/2012  . DYSPEPSIA 07/31/2010   Qualifier: Diagnosis of  By: [name]    . Hyperlipidemia 05/16/2010   Qualifier: Diagnosis of  By: [name], [name]     . Hypertension   . HYPERTENSION, BENIGN 05/16/2010   Qualifier: Diagnosis of  By: [name], [name]    . Stenosis of right carotid artery 02/01/2017    Past Surgical History:  Procedure Laterality Date  . HIP SURGERY     right hip   . Intestinal Blockage    . LOOP RECORDER INSERTION N/A 05/14/2017   Procedure: Loop Recorder Insertion;  Surgeon: [name], MD;  Location: [place] [facility];  Service: Cardiovascular;  Laterality: N/A;  . SKIN CANCER EXCISION     x2  . TONSILLECTOMY      There were no vitals filed for this visit.  Subjective Assessment - 07/02/19 0931    Subjective  Pt reports feeling good today with no questions or concerns at this time. He felt fatigued after the last session but no complaints of increase in pain. He  did his HEP once last week.    Pertinent History  The patient is a 85 y.o. male who was referred for physical therapy to treat his right hip. He is over 3 months status post percutaneous pinning of a right femoral neck fracture performed by [name] in [place]. Fall occurred on 10/25/18. After the surgery he went to SNF for 1 week and then discharged to his daughters house for 6 weeks. After returning home to [place] he started PT at an outpatient clinic in [place] and then transferred to [facility]. He was working with physical therapy at [facility] for "multiple weeks." He had scheduling issues with [facility] so he is here at [facility] for an evaluation. He is currently using a single point cane for ambulation for the last month. He would like to transfer off of his cane to no assistive device. He has been weightbearing as tolerated for some time.  He denies any significant resting groin or thigh pain upon arrival but does have some intermittent anterior and posterior hip pain on occasion. History of CVA 2 years ago but pt denies any residual deficits. Otherwise he denies any specific questions or concerns. Lives with wife in multilevel house but can stay on main floor, 2 steps  to enter with L railing. No reported trouble with stairs. Independent with ADLs/IADLs and drives.    Patient Stated Goals  "I want to walk without a cane." Pt would like to improve his strength, gait, and balance    Currently in Pain?  Yes    Pain Score  2     Pain Location  Leg    Pain Orientation  Left    Pain Descriptors / Indicators  Aching;Sore         TREATMENT   Therapeutic Exercise Octane L3/L6 intervals x5 minutes for warmup throughout subjective; pt performs without UE. Fatigue monitored throughout; STS in regular height chair without UE support for speed 2x10; noticeable decr in speed during 2nd set, x1 LOB posteriorly due to dec extension moment and fatigue    Neuromuscular  Re-education Standing airex cone toe taps with 1-3 consecutive taps to increase single leg stance time without UE support alt LE x10 each LE    Compensatory strategy training forward, backward, laterally x3 each direction; minA required d/t instability taking numerous steps and utilizing bilateral UE support  Standing airex toe taps at front step for balance and inc SLS x30s each LE  Standing  toe taps at front step of stais for speed 2x30s each LE  Star taps for speed x10 each LE; for reaction time and SLS  Gait in hallway with quick 180 deg turns x75'; turning in each direction  Gait in hallway with ball pass over shoulder and pass back to therapist over contralateral shoulder x75' each direction  Agility ladder 2 ft in each square forward for speed x4; CGA Agility ladder 2-ft in 2-ft out side stepping for speed x2 each direction; CGA      Pt educated throughout session about proper posture and technique with exercises. Improved exercise technique, movement at target joints, use of target muscles after min to mod verbal, visual, tactile cues.      Pt demonstrates good motivation at today's physical therapy session. Pt requires intermittent rest breaks during session due to fatigue resulting after exercises focused on speed. He demonstrates improved single leg stance with step toe taps for speed by not requiring UE support and no loss of balance throughout exercise. Pt tolerates higher level balance activities well however still demonstrates deficits with compensatory strategies. He did well with quick turns during gait with x1 loss of balance with a quick turn to the left. He recovered with multiple steps without additional support. He demonstrates improvement in all exercises with practice. He did well with agility ladder exercises only requiring CGA for safety.  Pt requires CGA during exercises for safety. He can recover himself from a loss of balance but depends heavily on UE support prior to  taking a step and PT for minA once fatigued. He will benefit from PT services to address deficits in strength, balance, and mobility in order to return to full function at home.     PT Short Term Goals - 06/02/19 0933      PT SHORT TERM GOAL #1   Title  Pt will be independent with HEP in order to decrease hip pain and increase strength in order to improve pain-free function at home and work.    Time  6    Period  Weeks    Status  On-going    Target Date  06/16/19        PT Long Term Goals - 06/02/19 0934      PT LONG TERM GOAL #  1   Title  Pt will increase LEFS by at least 9 points in order to demonstrate significant improvement in lower extremity function.      Baseline  02/12/19: 58/80; 05/05/19: 33/80; 06/03/19: 46/80    Time  12    Period  Weeks    Status  On-going    Target Date  07/28/19      PT LONG TERM GOAL #2   Title  Pt will decrease worst R hip pain as reported on NPRS by at least 3 points in order to demonstrate clinically significant reduction in ankle/foot pain.     Baseline  02/12/19: worst: 4/10; 05/05/19: 2/10; 06/02/19: "1-2/10 maybe once over the last couple weeks"    Time  12    Period  Weeks    Status  Partially Met    Target Date  07/28/19      PT LONG TERM GOAL #3   Title  Pt will increase strength of R hip extension and abduction by at least 1/2 MMT grade in order to demonstrate improvement in strength and function    Baseline  02/12/19: R hip extension 3/5, R hip abduction: 4-/5; 05/05/19: unchanged; 06/03/19: unchanged    Time  12    Period  Weeks    Status  On-going    Target Date  07/28/19      PT LONG TERM GOAL #4   Title  Pt will increase self-selected 10MWT by at least 0.13 m/s in order to demonstrate clinically significant improvement in community ambulation.    Baseline  02/12/19: 0.81 m/s; 05/05/19: 0.66 m/s; 06/03/19: 0.86 m/s    Time  12    Period  Weeks    Status  New    Target Date  07/28/19            Plan - 07/02/19 2774    Clinical  Impression Statement  Pt demonstrates good motivation at today's physical therapy session. Pt requires intermittent rest breaks during session due to fatigue resulting after exercises focused on speed. He demonstrates improved single leg stance with step toe taps for speed by not requiring UE support and no loss of balance throughout exercise. Pt tolerates higher level balance activities well however still demonstrates deficits with compensatory strategies. He did well with quick turns during gait with x1 loss of balance with a quick turn to the left. He recovered with multiple steps without additional support. He demonstrates improvement in all exercises with practice. He did well with agility ladder exercises only requiring CGA for safety.  Pt requires CGA during exercises for safety. He can recover himself from a loss of balance but depends heavily on UE support prior to taking a step and PT for minA once fatigued. He will benefit from PT services to address deficits in strength, balance, and mobility in order to return to full function at home.    Personal Factors and Comorbidities  Age;Comorbidity 1;Comorbidity 2;Comorbidity 3+    Comorbidities  CVA, CAD, HTN    Examination-Activity Limitations  Bend;Transfers    Examination-Participation Restrictions  Community Activity    Rehab Potential  Good    PT Frequency  2x / week    PT Duration  12 weeks    PT Treatment/Interventions  ADLs/Self Care Home Management;Aquatic Therapy;Biofeedback;Canalith Repostioning;Cryotherapy;Electrical Stimulation;Iontophoresis 75m/ml Dexamethasone;Moist Heat;Traction;Ultrasound;DME Instruction;Gait training;Stair training;Functional mobility training;Therapeutic activities;Therapeutic exercise;Balance training;Neuromuscular re-education;Patient/family education;Manual techniques;Passive range of motion;Dry needling;Vestibular    PT Next Visit Plan  Outcome measures, goals, progress note. Progress strength and  balance,  eventually get new order to address back pain    PT Home Exercise Plan  Medbridge Access Code: [email or web address]    Consulted and Agree with Plan of Care  Patient       Patient will benefit from skilled therapeutic intervention in order to improve the following deficits and impairments:  Abnormal gait, Decreased balance, Difficulty walking, Decreased strength  Visit Diagnosis: 1. Muscle weakness (generalized)   2. Other abnormalities of gait and mobility   3. Unsteadiness on feet   4. Other lack of coordination   5. Pain in right hip        Problem List Patient Active Problem List   Diagnosis Date Noted  . Stenosis of right carotid artery 02/01/2017  . Acute embolic stroke ([place]) 97/84/7841  . Bradycardia 11/10/2015  . Essential tremor 06/03/2015  . Dizziness 08/21/2012  . Syncope 04/10/2011  . CALF PAIN, LEFT 10/11/2010  . DYSPEPSIA 07/31/2010  . CHEST DISCOMFORT 07/31/2010  . Hyperlipidemia 05/16/2010  . HYPERTENSION, BENIGN 05/16/2010  . CAD, NATIVE VESSEL 05/16/2010   This entire session was performed under direct supervision and direction of a licensed therapist/therapist assistant . I have personally read, edited and approve of the note as written.   Elmyra Ricks Monica Codd SPT Phillips Grout PT, DPT, GCS  Huprich,Jason 07/02/2019, 1:39 PM  [place] MAIN [facility] SERVICES [address] [place], [place], [address] Phone: [phone]   Fax:  [phone]  Name: Gibson Lad [facility]. MRN: [id number] Date of Birth: 15-Oct-1933

## 2019-07-06 NOTE — Progress Notes (Signed)
[facility] Summary Report / Loop Recorder 

## 2019-07-07 ENCOUNTER — Other Ambulatory Visit: Payer: Self-pay

## 2019-07-07 ENCOUNTER — Ambulatory Visit: Payer: Medicare Other | Attending: Orthopedic Surgery

## 2019-07-07 DIAGNOSIS — R2681 Unsteadiness on feet: Secondary | ICD-10-CM | POA: Insufficient documentation

## 2019-07-07 DIAGNOSIS — R2689 Other abnormalities of gait and mobility: Secondary | ICD-10-CM

## 2019-07-07 DIAGNOSIS — G8929 Other chronic pain: Secondary | ICD-10-CM | POA: Diagnosis present

## 2019-07-07 DIAGNOSIS — M545 Low back pain: Secondary | ICD-10-CM | POA: Insufficient documentation

## 2019-07-07 DIAGNOSIS — M6281 Muscle weakness (generalized): Secondary | ICD-10-CM | POA: Insufficient documentation

## 2019-07-07 NOTE — Therapy (Signed)
[place] MAIN [facility] SERVICES [address] [place], [place], [address] Phone: [phone]   Fax:  [phone]  Physical Therapy Treatment/Progress Note/Discharge  Dates of reporting period  06/04/19   to   07/07/19  Patient Details  Name: Calvin [facility]. MRN: [id number] Date of Birth: [birth year] Referring Provider (PT): Dr. Marry Guan   Encounter Date: 07/07/2019  PT End of Session - 07/07/19 1440    Visit Number  20    Number of Visits  49    Date for PT Re-Evaluation  07/28/19    Authorization Type  Eval: 02/16/19 (break following for COVID), goals updated today 07/07/19    PT Start Time  1430    PT Stop Time  1515    PT Time Calculation (min)  45 min    Equipment Utilized During Treatment  Gait belt    Activity Tolerance  Patient tolerated treatment well    Behavior During Therapy  WFL for tasks assessed/performed       Past Medical History:  Diagnosis Date  . Acute embolic stroke ([place]) 06/05/9448  . BPH (benign prostatic hyperplasia)   . Bradycardia 11/10/2015  . Coronary artery disease   . Dizziness 08/21/2012  . DYSPEPSIA 07/31/2010   Qualifier: Diagnosis of  By: [name]    . Hyperlipidemia 05/16/2010   Qualifier: Diagnosis of  By: [name], [name]     . Hypertension   . HYPERTENSION, BENIGN 05/16/2010   Qualifier: Diagnosis of  By: [name], [name]    . Stenosis of right carotid artery 02/01/2017    Past Surgical History:  Procedure Laterality Date  . HIP SURGERY     right hip   . Intestinal Blockage    . LOOP RECORDER INSERTION N/A 05/14/2017   Procedure: Loop Recorder Insertion;  Surgeon: [name], MD;  Location: [place] [facility];  Service: Cardiovascular;  Laterality: N/A;  . SKIN CANCER EXCISION     x2  . TONSILLECTOMY      There were no vitals filed for this visit.  Subjective Assessment - 07/07/19 1437    Subjective  Pt reports feeling good today and appears motivated for therapy today. His RLE  is "acting up" a little but it's typical for him.    Pertinent History  The patient is a 85 y.o. male who was referred for physical therapy to treat his right hip. He is over 3 months status post percutaneous pinning of a right femoral neck fracture performed by [name] in [place]. Fall occurred on 10/25/18. After the surgery he went to SNF for 1 week and then discharged to his daughters house for 6 weeks. After returning home to [place] he started PT at an outpatient clinic in [place] and then transferred to [facility]. He was working with physical therapy at [facility] for "multiple weeks." He had scheduling issues with [facility] so he is here at [facility] for an evaluation. He is currently using a single point cane for ambulation for the last month. He would like to transfer off of his cane to no assistive device. He has been weightbearing as tolerated for some time.  He denies any significant resting groin or thigh pain upon arrival but does have some intermittent anterior and posterior hip pain on occasion. History of CVA 2 years ago but pt denies any residual deficits. Otherwise he denies any specific questions or concerns. Lives with wife in multilevel house but can stay on  main floor, 2 steps to enter with L railing. No reported trouble with stairs. Independent with ADLs/IADLs and drives.    Patient Stated Goals  "I want to walk without a cane." Pt would like to improve his strength, gait, and balance    Currently in Pain?  Yes    Pain Score  2     Pain Location  Hip    Pain Orientation  Left    Pain Descriptors / Indicators  Aching;Sore    Pain Onset  More than a month ago    Pain Frequency  Intermittent          TREATMENT  Therapeutic Exercise Octane L4/L8 x5 minutes  Interval history obtained from patient; Pt completed LEFS: 37/80 (unbilled); Outcome measures completed with patient including:  72mgait speed:self selected:11.2s= 0.89 m/s, fastest:7.3s=  1.350m MMT (see below)  Strength R/L 5/4+Hip flexion 5/5 Hip external rotation 5/5 Hip internal rotation 4/4Hip extension  4/4Hip abduction 4+/4Hip adduction 5/5 Knee extension 5/5Knee flexion 5/5 Ankle Dorsiflexion *indicates pain  Brief history obtained regarding low back pain. Pt reports chronic low back pain for multiple years. Describes pain as an ache. Pain intermittent and worse in the AM. No history of trauma or surgery. Reports plain film radiographs but no history of MRI. Pt reports that he was diagnosed with arthritis in his back. He would like to try physical therapy to see if it will help his back. Reviewed plan of care with patient, discharge, and discussed that therapy will attempt to obtain a new order from PCP to address this issue.    Repeated outcome measures with patient today. He demonstrates regression in LEFS score to 37/80 today compared to 46/80 on 06/02/19. His 1049mit speed has continued to improve minimally however is [facility] is safe. LE strength has continued to improve and while hip extension and abduction have improved they remain the weakest directions bilaterally. His R hip pain has also progressively improved and he reports that it is only a 1/10 after he has been very active. Pt is ready to discharge on this date from therapy for his hip. However he has numerous c/o chronic low back pain throughout treatments which has been ongoing for multiple years. Pt would like to pursue physical therapy treatment to address his chronic low back pain. Therapist will send an order request to his PCP. Pt will benefit from PT services to address deficits in strength, mobility, and pain in order to return to full function at home.          PT Education - 07/07/19 1439    Education Details  technique and form throughout session    Person(s) Educated  Patient    Methods  Explanation;Demonstration;Verbal cues    Comprehension  Verbal cues required;Returned  demonstration;Verbalized understanding       PT Short Term Goals - 07/07/19 1516      PT SHORT TERM GOAL #1   Title  Pt will be independent with HEP in order to decrease hip pain and increase strength in order to improve pain-free function at home and work.    Time  6    Period  Weeks    Status  Partially Met    Target Date  06/16/19        PT Long Term Goals - 07/07/19 1519      PT LONG TERM GOAL #1   Title  Pt will increase LEFS by at least 9 points in order to demonstrate significant  improvement in lower extremity function.      Baseline  02/12/19: 58/80; 05/05/19: 33/80; 06/03/19: 46/80; 07/07/19: 37/80    Time  12    Period  Weeks    Status  On-going      PT LONG TERM GOAL #2   Title  Pt will decrease worst R hip pain as reported on NPRS by at least 3 points in order to demonstrate clinically significant reduction in ankle/foot pain.     Baseline  02/12/19: worst: 4/10; 05/05/19: 2/10; 06/02/19: "1-2/10 maybe once over the last couple weeks"; 07/07/19: 1/10 worst "if doing a lot"    Time  12    Period  Weeks    Status  Achieved      PT LONG TERM GOAL #3   Title  Pt will increase strength of R hip extension and abduction by at least 1/2 MMT grade in order to demonstrate improvement in strength and function    Baseline  02/12/19: R hip extension 3/5, R hip abduction: 4-/5; 05/05/19: unchanged; 06/03/19: unchanged; 07/07/19: R hip extension 4/5, R hip abduction 4/5    Time  12    Period  Weeks    Status  Achieved      PT LONG TERM GOAL #4   Title  Pt will increase self-selected 10MWT by at least 0.13 m/s in order to demonstrate clinically significant improvement in community ambulation.    Baseline  02/12/19: 0.81 m/s; 05/05/19: 0.66 m/s; 06/03/19: 0.86 m/s; 07/07/19:  self selected: 11.2s =  0.89 m/s, fastest: 7.3s =  1.52ms    Time  12    Period  Weeks    Status  Achieved         Visit Diagnosis: 1. Muscle weakness (generalized)   2. Unsteadiness on feet   3. Other abnormalities of  gait and mobility        Problem List Patient Active Problem List   Diagnosis Date Noted  . Stenosis of right carotid artery 02/01/2017  . [place] 041/74/0814 . Bradycardia 11/10/2015  . Essential tremor 06/03/2015  . Dizziness 08/21/2012  . Syncope 04/10/2011  . CALF PAIN, LEFT 10/11/2010  . DYSPEPSIA 07/31/2010  . CHEST DISCOMFORT 07/31/2010  . Hyperlipidemia 05/16/2010  . HYPERTENSION, BENIGN 05/16/2010  . CAD, NATIVE VESSEL 05/16/2010    This entire session was performed under direct supervision and direction of a licensed therapist/therapist assistant . I have personally read, edited and approve of the note as written.   NElmyra RicksTurcotte SPT JPhillips GroutPT, DPT, GCS  Huprich,Jason 07/08/2019, 10:35 AM  [place] [facility] [address] [address]Phone: [phone]  Fax:  [phone] Name: Calvin YohannesBMid - [facility] MRN: [id number] of Birth: 103/11/1933

## 2019-07-14 ENCOUNTER — Ambulatory Visit: Payer: Medicare Other

## 2019-07-14 ENCOUNTER — Other Ambulatory Visit: Payer: Self-pay

## 2019-07-14 DIAGNOSIS — M545 Low back pain, unspecified: Secondary | ICD-10-CM

## 2019-07-14 DIAGNOSIS — M6281 Muscle weakness (generalized): Secondary | ICD-10-CM | POA: Diagnosis not present

## 2019-07-14 DIAGNOSIS — G8929 Other chronic pain: Secondary | ICD-10-CM

## 2019-07-14 NOTE — Therapy (Signed)
[place] MAIN [facility] SERVICES [address] [place], [place], [address] Phone: [phone]   Fax:  [phone]  Physical Therapy Evaluation  Patient Details  Name: [name]. MRN: [id number] Date of Birth: 27-Dec-1932 Referring Provider (PT): [name]   Encounter Date: 07/14/2019  PT End of Session - 07/14/19 1226    Visit Number  1    Number of Visits  25    Date for PT Re-Evaluation  10/06/19    Authorization Type  Eval 8/11/0    PT Start Time  0930    PT Stop Time  1015    PT Time Calculation (min)  45 min    Activity Tolerance  Patient tolerated treatment well    Behavior During Therapy  [facility] for tasks assessed/performed       Past Medical History:  Diagnosis Date  . Acute embolic stroke ([place]) 05/08/82  . BPH (benign prostatic hyperplasia)   . Bradycardia 11/10/2015  . Coronary artery disease   . Dizziness 08/21/2012  . DYSPEPSIA 07/31/2010   Qualifier: Diagnosis of  By: [name]    . Hyperlipidemia 05/16/2010   Qualifier: Diagnosis of  By: [name]     . Hypertension   . HYPERTENSION, BENIGN 05/16/2010   Qualifier: Diagnosis of  By: [name]    . Stenosis of right carotid artery 02/01/2017    Past Surgical History:  Procedure Laterality Date  . HIP SURGERY     right hip   . Intestinal Blockage    . LOOP RECORDER INSERTION N/A 05/14/2017   Procedure: Loop Recorder Insertion;  Surgeon: [name];  Location: [facility];  Service: Cardiovascular;  Laterality: N/A;  . SKIN CANCER EXCISION     x2  . TONSILLECTOMY      There were no vitals filed for this visit.   Subjective Assessment - 07/14/19 1318    Subjective  Pt presents for evaluation of LBP    Pertinent History  Pt states that his LBP started a few years ago, but doesn't recall any specific MOI.  He states that Dr. Marry Guan suggested taking tyenol PM, which has given him some relief.  He states that it is worse  when getting out of bed in AM, as well as with tying his shoes.  He states that he fatigues easily with trunk flexion and that it brings on pain.  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  He denies any numbness and tingling, but does report some referred pain into bilateral posterior thigh that stays above his knee.    Limitations  Sitting;Walking;Standing;Lifting    How long can you sit comfortably?  5-15 min in a car; up to 30-45 min in a supportive chair    How long can you stand comfortably?  pain begins almost immediately    How long can you walk comfortably?  more limited due to LE's, not LBP    Diagnostic tests  x-rays    Patient Stated Goals  to decrease back pain    Currently in Pain?  No/denies    Pain Score  0-No pain   Worst: 4/10   Pain Location  Back    Pain Orientation  Lower    Pain Descriptors / Indicators  Aching    Pain Type  Chronic pain    Pain Radiating Towards  Down BLE  thighs    Pain Onset  More than a month ago    Pain Frequency  Intermittent    Aggravating Factors   trunk flexion and side bending    Pain Relieving Factors  activites putting him into more extension like using his backpack blower in the yard; tylenol PM, laying supine    Multiple Pain Sites  No         SUBJECTIVE Chief complaint:  Pt states that his LBP started a few years ago, but doesn't recall any specific MOI.  He states that [name] suggested taking tyenol PM, which has given him some relief.  He states that it is worse when getting out of bed in AM, as well as with tying his shoes.  He states that he fatigues easily with trunk flexion and that it brings on pain.  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  He denies any numbness and tingling, but does report some referred pain into his posterior thigh that stays above his knee.   Onset: a few years  ago Referring Dx: LBP Pollard: [name] Pain: 0/10 Present, 0/10 Best, 4/10 Worst Aggravating factors: trunk flexion, trunk side bending Easing factors: wearing a backpack blower, putting him into more extension; tylenol PM; laying supine 24 hour pain behavior: more pain in AM How long can you sit: riding in car pain begins within 5-15 min.  Has to reposition to relieve pain; sitting in a supportive chair, can sit for 30-45 min before onset of pain How long can you stand: pain begins almost automatically How long can you walk: minor irritation occasionally; more-so pain in legs;  Recent back trauma: No Prior history of back injury or pain: No Pain quality: pain quality: aching Radiating pain: Yes bilateral; states L is slightly worse; raditating pain in posteior thigh Numbness/Tingling: No Follow-up appointment with Pollard: No Dominant hand: right Imaging: Yes; x-rays showing arthritic changes in spine. Pt does not recall if he has had an MRI or not    OBJECTIVE  Mental Status Patient is oriented to person, place and time.  Recent memory is intact.  Remote memory is intact.  Attention span and concentration are intact.  Expressive speech is intact.  Patient's fund of knowledge is within normal limits for educational level.  SENSATION: Grossly intact to light touch bilateral LEs as determined by testing dermatomes L2-S2 Proprioception and hot/cold testing deferred on this date   MUSCULOSKELETAL: Tremor: Absent Bulk: Normal Tone: Normal  Posture Increased thoracic kyphosis, rounded shoulders, and forward head, but decreased lumbar lordosis  Gait Walks with externally rotated hips, especially RLE, using a SPC   Palpation TTP and increased muscle tension noted in lower erector spinae, L>R.  Hypomobility noted with PA CPA's and UPA's of L1, L2, L4, L5, and grossly hypomobile throughout thoracic spine.   Strength (out of 5) R/L 4/4 Hip flexion 4+/5 Hip ER 5/5 Hip IR 4+/4+ Hip  abduction 4/4 Hip adduction 4/4 Hip extension 5/5 Knee extension 5/5 Knee flexion 5/5 Ankle dorsiflexion *Indicates pain   AROM (degrees) R/L (all movements include overpressure unless otherwise stated) Lumbar forward flexion (0-65): severely limited Lumbar extension (0-30): severely limited Lumbar lateral flexion (0-25): minimal motion coming from lumbar spine; all motion coming from thoracic  Lumbar rotation: moderately limited Hip IR (0-45): R: 5 deg L: 40 deg Hip ER (0-45): R: 55 deg L: 33 deg *Indicates pain  Muscle Length Hamstrings: R: 46 degrees L: 46 degrees  [place]: 90 deg bilaterally Modified Thomas: WNL   Passive Accessory Intervertebral Motion (PAIVM) Pt denies reproduction of back pain with CPA L1-L5 and UPA bilaterally L1-L5.  L1, L2, L4, L5 very hypomobile with UPA and CPAs; increased muscle tension in erector spinea, especially on L    SPECIAL TESTS Lumbar quadrant: R: Negative L: Negative Slump: R: Negative L: Negative SLR: R: Negative L:  Negative Crossed SLR: R: Negative L: Negative FABER: R: Negative L: Negative FADIR: R: Negative L: Negative  Hip scour: R: Negative L: Negative Forward Step-Down Test: R: Not done L: Not done Lateral Step-Down Test: R: Not done L: Not done Deep Squat: Not done                [facility] PT Assessment - 07/14/19 1221      Assessment   Medical Diagnosis  LBP    Referring Provider (PT)  [name]    Onset Date/Surgical Date  12/03/16    Hand Dominance  Right    Next Pollard Visit  not reported    Prior Therapy  [place] in [place], [facility], and [facility] PT      Precautions   Precautions  None      Restrictions   Weight Bearing Restrictions  No      Balance Screen   Has the patient fallen in the past 6 months  No    Has the patient had a decrease in activity level because of a fear of falling?   Yes    Is the patient reluctant to leave their home because of a fear of falling?   No      Home Environment   Living  Environment  Private residence    Living Arrangements  Spouse/significant other    Type of Home  House    Home Access  Stairs to enter    Entrance Stairs-Number of Steps  2    Entrance Stairs-Rails  Left    Home Layout  Two level    Alternate Level Stairs-Rails  Right    Home Equipment  Walker - 2 wheels      Prior Function   Level of Independence  Independent    Vocation  Retired      IT consultantCognition   Overall Cognitive Status  No family/caregiver present to determine baseline cognitive functioning           PT Short Term Goals - 07/14/19 1310      PT SHORT TERM GOAL #1   Title  Pt will be independent with HEP in order to improve strength and decrease back pain in order to improve pain-free function at home.    Time  6    Period  Weeks    Status  New    Target Date  08/25/19        PT Long Term Goals - 07/14/19 1311      PT LONG TERM GOAL #1   Title  Pt will decrease worst back pain as reported on NPRS by at least 2 points in order to demonstrate clinically significant reduction in back pain.    Baseline  07/14/19: worst = 4/10    Time  12    Period  Weeks    Status  New    Target Date  10/06/19      PT LONG TERM GOAL #2   Title  Pt will increase strength of by at least 1/2  MMT grade in order to demonstrate improvement in strength and function.    Baseline  07/14/19: B hip flexion 4/5, R hip ER 4+/5, B hip abduction 4+, B hip adduction 4/5, B hip extension 4/5    Time  12    Period  Weeks    Status  New    Target Date  10/06/19      PT LONG TERM GOAL #3   Title  Pt will be able to sit for at least 30 minutes while riding in a car withou tincrease in his back pain in order to improve tolerance for travel to appointments and personal events.    Baseline  07/14/19: 5-15 minutes before back pain increases    Time  12    Period  Weeks    Status  New    Target Date  10/06/19      PT LONG TERM GOAL #4   Title  Pt will decrease mODI scoreby at least 13 points in order  demonstrate clinically significant reduction in back pain/disability.    Baseline  07/14/19: Pt to complete at next visit    Time  12    Period  Weeks    Status  --    Target Date  10/06/19             Plan - 07/14/19 1302    Clinical Impression Statement  Pt is a pleasant 85 year-old male referred for low back pain. PT examination reveals hypomobility throughout thoracic and lumbar spine, with severe hypomobility noted in L1, L2, L4, L5 CPA's and UPA's.  He also presents with decreased lumbar ROM, with severely limited trunk flexion, extension, and sidebending, with little to no motion coming from the lumbar spine.  Addionally, he presents with tight erector spinae and rectus femoris bilaterally.  He reports some referred pain into his posterior thigh that stops above the knee, but does not endorse any reproduction of symptoms with the Slump test or SLR. No N/T in BLE.  He presents with mild strength deficits bilaterally with hip flexion, hip extension, hip adduction, and hip abduction.  Pt presents with deficits in strength, mobility, range of motion, and pain. Pt will benefit from skilled PT services to address deficits and return to pain-free function at home.    Personal Factors and Comorbidities  Age;Behavior Pattern;Comorbidity 3+;Time since onset of injury/illness/exacerbation    Comorbidities  hx of stroke, CAD, HLD, HTN, age, chronicity of onset, hx of hip surgery    Examination-Activity Limitations  Sit;Sleep;Lift;Bend;Squat;Stand    Examination-Participation Restrictions  Community Activity;Yard Work    Agricultural consultanttability/Clinical Decision Making  Evolving/Moderate complexity    Clinical Decision Making  Low    Rehab Potential  Fair    PT Frequency  2x / week    PT Duration  12 weeks    PT Treatment/Interventions  Canalith Repostioning;Cryotherapy;Electrical Stimulation;Iontophoresis 4mg /ml Dexamethasone;Moist Heat;Traction;Ultrasound;Functional mobility training;Therapeutic  activities;Therapeutic exercise;Balance training;Neuromuscular re-education;Patient/family education;Manual techniques;Dry needling;Spinal Manipulations;Joint Manipulations;Aquatic Therapy;Passive range of motion;Vestibular    PT Next Visit Plan  assess centralization/peripheralization of symptoms; develop an HEP; CPAs and UPA's of lumbar spine; manual therapy, have pt complete mODI    PT Home Exercise Plan  verbally instructed to attempt lying in prone intermittently throughout the day for 3-5 minutes at a time    Consulted and Agree with Plan of Care  Patient       Patient will benefit from skilled therapeutic intervention in order to improve the following deficits and impairments:  Difficulty walking,  Hypomobility, Increased muscle spasms, Pain, Impaired flexibility, Decreased strength, Decreased activity tolerance, Abnormal gait  Visit Diagnosis: 1. Chronic bilateral low back pain without sciatica        Problem List Patient Active Problem List   Diagnosis Date Noted  . Stenosis of right carotid artery 02/01/2017  . Acute embolic stroke ([facility]) 02/01/2017  . Bradycardia 11/10/2015  . Essential tremor 06/03/2015  . Dizziness 08/21/2012  . Syncope 04/10/2011  . CALF PAIN, LEFT 10/11/2010  . DYSPEPSIA 07/31/2010  . CHEST DISCOMFORT 07/31/2010  . Hyperlipidemia 05/16/2010  . HYPERTENSION, BENIGN 05/16/2010  . CAD, NATIVE VESSEL 05/16/2010    This entire session was performed under direct supervision and direction of a licensed therapist/therapist assistant . I have personally read, edited and approve of the note as written.   Ronie Spies, SPT Lynnea Maizes PT, DPT, GCS  Huprich,Jason 07/15/2019, 3:20 PM  [facility] MAIN [facility] SERVICES [address] [place], [place], [address] Phone: [phone]   Fax:  [phone]  Name: Tyrick Dunagan [place]. MRN: [id number] Date of Birth: Sep 14, 1933

## 2019-07-16 ENCOUNTER — Ambulatory Visit: Payer: Medicare Other

## 2019-07-16 ENCOUNTER — Other Ambulatory Visit: Payer: Self-pay

## 2019-07-16 DIAGNOSIS — G8929 Other chronic pain: Secondary | ICD-10-CM

## 2019-07-16 DIAGNOSIS — M6281 Muscle weakness (generalized): Secondary | ICD-10-CM

## 2019-07-16 DIAGNOSIS — M545 Low back pain, unspecified: Secondary | ICD-10-CM

## 2019-07-16 NOTE — Therapy (Signed)
[place] [facility] MAIN [facility] [address] [place], [place], [address] Phone: [phone]   Fax:  [phone]  Physical Therapy Treatment  Patient Details  Name: Calvin AmberGeorge Stanford Lafever Jr. MRN: [id number] Date of Birth: 01-08-1933 Referring Provider (PT): [name]   Encounter Date: 07/16/2019  PT End of Session - 07/16/19 0931    Visit Number  2    Number of Visits  25    Date for PT Re-Evaluation  10/06/19    Authorization Type  Eval 8/11/0    PT Start Time  0930    PT Stop Time  1015    PT Time Calculation (min)  45 min    Activity Tolerance  Patient tolerated treatment well    Behavior During Therapy  [facility] for tasks assessed/performed       Past Medical History:  Diagnosis Date  . Acute embolic stroke ([facility]) 02/01/2017  . BPH (benign prostatic hyperplasia)   . Bradycardia 11/10/2015  . Coronary artery disease   . Dizziness 08/21/2012  . DYSPEPSIA 07/31/2010   Qualifier: Diagnosis of  By: Dalphine Handinghompson, RN, Ashley    . Hyperlipidemia 05/16/2010   Qualifier: Diagnosis of  By: Mariah MillingGollan MD, [name]     . Hypertension   . HYPERTENSION, BENIGN 05/16/2010   Qualifier: Diagnosis of  By: Mariah MillingGollan MD, [name]    . Stenosis of right carotid artery 02/01/2017    Past Surgical History:  Procedure Laterality Date  . HIP SURGERY     right hip   . Intestinal Blockage    . LOOP RECORDER INSERTION N/A 05/14/2017   Procedure: Loop Recorder Insertion;  Surgeon: [name], Steven C, MD;  [facility];  Service: Cardiovascular;  Laterality: N/A;  . SKIN CANCER EXCISION     x2  . TONSILLECTOMY      There were no vitals filed for this visit.  Subjective Assessment - 07/16/19 0931    Subjective  Pt reports that he has tried some prone laying since his eval, and that his pain decreased.  He has no new questions or concerns at this time.    Pertinent History  Pt states that his LBP started a few years ago, but doesn't recall any specific MOI.  He states  that [name] suggested taking tyenol PM, which has given him some relief.  He states that it is worse when getting out of bed in AM, as well as with tying his shoes.  He states that he fatigues easily with trunk flexion and that it brings on pain.  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  He denies any numbness and tingling, but does report some referred pain into bilateral posterior thigh that stays above his knee.    Limitations  Sitting;Walking;Standing;Lifting    How long can you sit comfortably?  5-15 min in a car; up to 30-45 min in a supportive chair    How long can you stand comfortably?  pain begins almost immediately    How long can you walk comfortably?  more limited due to LE's, not LBP    Diagnostic tests  x-rays    Patient Stated Goals  to decrease back pain    Currently in Pain?  Yes    Pain Score  3     Pain Location  Back    Pain Orientation  Lower    Pain Descriptors / Indicators  Sharp    Pain Type  Chronic pain    Pain Onset  More than a month ago    Pain Frequency  Intermittent         TREATMENT  Manual Therapy  CPA and UPA joint mobs grade II-III; 4x30 second bouts; time    Soft tissue mobilization of erector spinae   Bent knee fall outs x10 in each direction   There-ex  Heat in prone 5 min  MODI: 24/50 = 48%  Repeated extensions x10 in standing with cueing to keep hips stable.    Anterior and posterior tilts x20 with max cueing to arch and flatten back against table initially, fading to min/mod cueing   HEP: anterior and posterior tilt, and bent knee fall outs, and lying in prone  Pt educated throughout session about proper posture and technique with exercises. Improved exercise technique, movement at target joints, use of target muscles after min to mod verbal, visual, tactile cues.              Pt demonstrates excellent motivation throughout today's session.  He responded well  to CPA and UPA grade II-III lumbar and thoracic joint mobs levels T8-L5.  He reports that repeated extensions feel good, but he does demonstrate difficulty with initiating movement from lumbar spine instead of his hips.  He demonstrates difficulty with anterior and posterior pelvic tilts, requiring max cueing initially, fading to min/mod cueing by the end of the exercise. He reports that he feels "looser" after his session.  He was given a new HEP today with bent knee fall outs, anterior and posterior pelvic tilts, and lying in prone.  He will continue to benefit from skilled PT services in order to decrease his pain and to increase his spinal mobility in order to improve to full function at home and to improve his QOL.      [facility] PT Assessment - 07/16/19 1258      Observation/Other Assessments   Other Surveys   Modified Oswestry    Modified Oswestry  24/50           PT Short Term Goals - 07/14/19 1310      PT SHORT TERM GOAL #1   Title  Pt will be independent with HEP in order to improve strength and decrease back pain in order to improve pain-free function at home.    Time  6    Period  Weeks    Status  New    Target Date  08/25/19        PT Long Term Goals - 07/16/19 1408      PT LONG TERM GOAL #1   Title  Pt will decrease worst back pain as reported on NPRS by at least 2 points in order to demonstrate clinically significant reduction in back pain.    Baseline  07/14/19: worst = 4/10    Time  12    Period  Weeks    Status  New    Target Date  10/06/19      PT LONG TERM GOAL #2   Title  Pt will increase strength of by at least 1/2 MMT grade in order to demonstrate improvement in strength and function.    Baseline  07/14/19: B hip flexion 4/5, R hip ER 4+/5, B hip abduction 4+, B hip adduction 4/5, B hip extension 4/5    Time  12    Period  Weeks    Status  New    Target Date  10/06/19      PT LONG TERM GOAL #3   Title  Pt will be able to sit for at least 30 minutes  while riding in a car withou tincrease in his back pain in order to improve tolerance for travel to appointments and personal events.    Baseline  07/14/19: 5-15 minutes before back pain increases    Time  12    Period  Weeks    Status  New    Target Date  10/06/19      PT LONG TERM GOAL #4   Title  Pt will decrease mODI scoreby at least 13 points in order demonstrate clinically significant reduction in back pain/disability.    Baseline  07/14/19: Pt to complete at next visit; 07/16/19: 24/50=48%    Time  12    Period  Weeks    Status  New    Target Date  10/06/19        Plan - 07/16/19 1014    Clinical Impression Statement  Pt demonstrates excellent motivation throughout today's session.  He responded well to CPA and UPA grade II-III lumbar and thoracic joint mobs levels T8-L5.  He reports that repeated extensions feel good, but he does demonstrate difficulty with initiating movement from lumbar spine instead of his hips.  He demonstrates difficulty with anterior and posterior pelvic tilts, requiring max cueing initially, fading to min/mod cueing by the end of the exercise. He reports that he feels "looser" after his session.  He was given a new HEP today with bent knee fall outs, anterior and posterior pelvic tilts, and lying in prone.  He will continue to benefit from skilled PT services in order to decrease his pain and to increase his spinal mobility in order to improve to full function at home and to improve his QOL.    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/Moderate complexity    Rehab Potential  Fair    PT Frequency  2x / week     PT Duration  12 weeks    PT Treatment/Interventions  Canalith Repostioning;Cryotherapy;Electrical Stimulation;Iontophoresis 4mg /ml Dexamethasone;Moist Heat;Traction;Ultrasound;Functional mobility training;Therapeutic activities;Therapeutic exercise;Balance training;Neuromuscular re-education;Patient/family education;Manual techniques;Dry needling;Spinal Manipulations;Joint Manipulations;Aquatic Therapy;Passive range of motion;Vestibular;Vasopneumatic Device    PT Next Visit Plan  CPAs and UPA's of lumbar spine; manual therapy    PT Home Exercise Plan  Access Code: [email or web address] (Hooklying lumbar rotation, ant/post pelvic tilt),  lying in prone intermittently throughout the day for 3-5 minutes at a time    Consulted and Agree with Plan of Care  Patient       Patient will benefit from skilled therapeutic intervention in order to improve the following deficits and impairments:  Difficulty walking, Hypomobility, Increased muscle spasms, Pain, Impaired flexibility, Decreased strength, Decreased activity tolerance, Abnormal gait  Visit Diagnosis: 1. Chronic bilateral low back pain without sciatica   2. Muscle weakness (generalized)        Problem List Patient Active Problem List   Diagnosis Date Noted  . Stenosis of right carotid artery 02/01/2017  . Acute embolic stroke ([place]) 60/09/9322  . Bradycardia 11/10/2015  . Essential tremor 06/03/2015  . Dizziness 08/21/2012  . Syncope 04/10/2011  . CALF PAIN, LEFT 10/11/2010  . DYSPEPSIA  07/31/2010  . CHEST DISCOMFORT 07/31/2010  . Hyperlipidemia 05/16/2010  . HYPERTENSION, BENIGN 05/16/2010  . CAD, NATIVE VESSEL 05/16/2010    This entire session was performed under direct supervision and direction of a licensed therapist/therapist assistant . I have personally read, edited and approve of the note as written.   Ronie Spiesracy Jozeph Persing, SPT Lynnea MaizesJason D Huprich PT, DPT, GCS  Huprich,Jason 07/16/2019, 4:26 PM  [facility]  MAIN [facility] [address] [place], [place], [address] Phone: [phone]   Fax:  [phone]  Name: Calvin ShortsGeorge Stanford [facility] Jr. MRN: [id number] Date of Birth: [birth year]

## 2019-07-16 NOTE — Patient Instructions (Signed)
Access Code: [email or web address]  URL: [email or web address]  Date: 07/16/2019  Prepared by: Roxana Hires   Exercises Supine Lower Trunk Rotation - 10 reps - 2 sets - 3s hold - 2x daily - 7x weekly Supine Pelvic Tilt - 10 reps - 2 sets - 3s hold - 2x daily - 7x weekly

## 2019-07-27 ENCOUNTER — Ambulatory Visit (INDEPENDENT_AMBULATORY_CARE_PROVIDER_SITE_OTHER): Payer: Medicare Other | Admitting: *Deleted

## 2019-07-27 DIAGNOSIS — I639 Cerebral infarction, unspecified: Secondary | ICD-10-CM

## 2019-07-27 DIAGNOSIS — R55 Syncope and collapse: Secondary | ICD-10-CM | POA: Diagnosis not present

## 2019-07-28 ENCOUNTER — Ambulatory Visit: Payer: Medicare Other

## 2019-07-28 ENCOUNTER — Other Ambulatory Visit: Payer: Self-pay

## 2019-07-28 DIAGNOSIS — G8929 Other chronic pain: Secondary | ICD-10-CM

## 2019-07-28 DIAGNOSIS — M6281 Muscle weakness (generalized): Secondary | ICD-10-CM

## 2019-07-28 DIAGNOSIS — M545 Low back pain, unspecified: Secondary | ICD-10-CM

## 2019-07-28 LAB — CUP PACEART REMOTE DEVICE CHECK
Date Time Interrogation Session: 20200822163817
Implantable Pulse Generator Implant Date: 20180612

## 2019-07-28 NOTE — Therapy (Signed)
[place] [facility] [address] [place], [place], [address] Phone: [phone]   Fax:  [phone]  Physical Therapy Treatment  Patient Details  Name: Calvin AmberGeorge Stanford Rybka Jr. MRN: [id number] Date of Birth: 1932-12-13 Referring Provider (PT): [name]   Encounter Date: 07/28/2019  PT End of Session - 07/28/19 0931    Visit Number  3    Number of Visits  25    Date for PT Re-Evaluation  10/06/19    Authorization Type  Eval 8/11/0    PT Start Time  0927    PT Stop Time  1002    PT Time Calculation (min)  35 min    Activity Tolerance  Patient tolerated treatment well    Behavior During Therapy  [facility] for tasks assessed/performed       Past Medical History:  Diagnosis Date  . Acute embolic stroke (HCC) 02/01/2017  . BPH (benign prostatic hyperplasia)   . Bradycardia 11/10/2015  . Coronary artery disease   . Dizziness 08/21/2012  . DYSPEPSIA 07/31/2010   Qualifier: Diagnosis of  By: Dalphine Handinghompson, RN, Ashley    . Hyperlipidemia 05/16/2010   Qualifier: Diagnosis of  By: Mariah MillingGollan MD, [name]     . Hypertension   . HYPERTENSION, BENIGN 05/16/2010   Qualifier: Diagnosis of  By: Mariah MillingGollan MD, [name]    . Stenosis of right carotid artery 02/01/2017    Past Surgical History:  Procedure Laterality Date  . HIP SURGERY     right hip   . Intestinal Blockage    . LOOP RECORDER INSERTION N/A 05/14/2017   Procedure: Loop Recorder Insertion;  Surgeon: [name], Steven C, MD;  [facility];  Service: Cardiovascular;  Laterality: N/A;  . SKIN CANCER EXCISION     x2  . TONSILLECTOMY      There were no vitals filed for this visit.  Subjective Assessment - 07/28/19 0929    Subjective  Pt states that his back still feels "terrible" when he wakes up in the morning. He denies any back pain upon arrival today. When asked about how he felt after the last therapy session he states that he can't remember. No specific questions or concerns at this  time.    Pertinent History  Pt states that his LBP started a few years ago, but doesn't recall any specific MOI.  He states that [name] suggested taking tyenol PM, which has given him some relief.  He states that it is worse when getting out of bed in AM, as well as with tying his shoes.  He states that he fatigues easily with trunk flexion and that it brings on pain.  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  He denies any numbness and tingling, but does report some referred pain into bilateral posterior thigh that stays above his knee.    Limitations  Sitting;Walking;Standing;Lifting    How long can you sit comfortably?  5-15 min in a car; up to 30-45 min in a supportive chair    How long can you stand comfortably?  pain begins almost immediately    How long can you walk comfortably?  more limited due to LE's, not LBP    Diagnostic tests  x-rays    Patient Stated Goals  to decrease back pain    Currently in Pain?  No/denies         TREATMENT  Manual Therapy  Grade II global lumbar rotational mobilizations 30s/bout x 2 bouts to each side; CPA joint mobs T10-L5  grade II 2 x 20 second bouts/level;   UPA joint mobs L1-L5  grade II x 20 second bouts/level bilateral;   Extensive IASTM of bilateral lumbar erector spinae x 5 minutes;   Ther-ex  NuStep L2-4 x 5 minutes during history with MHP on low back (2 minutes unbilled); Repeated extensions x10 in standing with verbal and tactile cueing to keep hips stable.   Bent knee fall outs with 5s hold x10 in each direction; Anterior and posterior tilts with 3-5s hold x 10 each direction with mod cueing to arch and flatten back against table initially;   Pt educated throughout session about proper posture and technique with exercises. Improved exercise technique, movement at target joints, use of target muscles after min to mod verbal, visual, tactile cues.                Pt demonstrates excellent motivation throughout today's session.  Mild reproduction of pain with lower lumbar CPA and UPA mobilizations.  He reports that repeated extensions feel good especially prone on elbows. He continues to lack considerable lumbar extension segmental mobility. He continues to demonstrate difficulty with anterior and posterior pelvic tilts however improved from last session. He will continue to benefit from skilled PT services in order to decrease his pain and to increase his spinal mobility in order to improve to full function at home and to improve his QOL.                        PT Short Term Goals - 07/14/19 1310      PT SHORT TERM GOAL #1   Title  Pt will be independent with HEP in order to improve strength and decrease back pain in order to improve pain-free function at home.    Time  6    Period  Weeks    Status  New    Target Date  08/25/19        PT Long Term Goals - 07/16/19 1408      PT LONG TERM GOAL #1   Title  Pt will decrease worst back pain as reported on NPRS by at least 2 points in order to demonstrate clinically significant reduction in back pain.    Baseline  07/14/19: worst = 4/10    Time  12    Period  Weeks    Status  New    Target Date  10/06/19      PT LONG TERM GOAL #2   Title  Pt will increase strength of by at least 1/2 MMT grade in order to demonstrate improvement in strength and function.    Baseline  07/14/19: B hip flexion 4/5, R hip ER 4+/5, B hip abduction 4+, B hip adduction 4/5, B hip extension 4/5    Time  12    Period  Weeks    Status  New    Target Date  10/06/19      PT LONG TERM GOAL #3   Title  Pt will be able to sit for at least 30 minutes while riding in a car withou tincrease in his back pain in order to improve tolerance for travel to appointments and personal events.    Baseline  07/14/19: 5-15 minutes before back pain increases    Time  12    Period  Weeks    Status  New    Target Date   10/06/19      PT LONG TERM GOAL #4   Title  Pt will decrease mODI scoreby at least 13 points in order demonstrate clinically significant reduction in back pain/disability.    Baseline  07/14/19: Pt to complete at next visit; 07/16/19: 24/50=48%    Time  12    Period  Weeks    Status  New    Target Date  10/06/19            Plan - 07/28/19 0931    Clinical Impression Statement  Pt demonstrates excellent motivation throughout today's session.  Mild reproduction of pain with lower lumbar CPA and UPA mobilizations.  He reports that repeated extensions feel good especially prone on elbows. He continues to lack considerable lumbar extension segmental mobility. He continues to demonstrate difficulty with anterior and posterior pelvic tilts however improved from last session. He will continue to benefit from skilled PT services in order to decrease his pain and to increase his spinal mobility in order to improve to full function at home and to improve his QOL.    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/Moderate complexity    Rehab Potential  Fair    PT Frequency  2x / week    PT Duration  12 weeks    PT Treatment/Interventions  Canalith Repostioning;Cryotherapy;Electrical Stimulation;Iontophoresis 4mg /ml Dexamethasone;Moist Heat;Traction;Ultrasound;Functional mobility training;Therapeutic activities;Therapeutic exercise;Balance training;Neuromuscular re-education;Patient/family education;Manual techniques;Dry needling;Spinal Manipulations;Joint Manipulations;Aquatic Therapy;Passive range of motion;Vestibular;Vasopneumatic Device    PT Next  Visit Plan  CPAs and UPA's of lumbar spine; manual therapy    PT Home Exercise Plan  Access Code: [email or web address] (Hooklying lumbar rotation, ant/post pelvic tilt),  lying in prone intermittently throughout the day for 3-5 minutes at a time    Consulted and Agree with Plan of Care  Patient       Patient will benefit from skilled therapeutic intervention in order to improve the following deficits and impairments:  Difficulty walking, Hypomobility, Increased muscle spasms, Pain, Impaired flexibility, Decreased strength, Decreased activity tolerance, Abnormal gait  Visit Diagnosis: Chronic bilateral low back pain without sciatica  Muscle weakness (generalized)     Problem List Patient Active Problem List   Diagnosis Date Noted  . Stenosis of right carotid artery 02/01/2017  . Acute embolic stroke ([place]) 50/27/7412  . Bradycardia 11/10/2015  . Essential tremor 06/03/2015  . Dizziness 08/21/2012  . Syncope 04/10/2011  . CALF PAIN, LEFT 10/11/2010  . DYSPEPSIA 07/31/2010  . CHEST DISCOMFORT 07/31/2010  . Hyperlipidemia 05/16/2010  . HYPERTENSION, BENIGN 05/16/2010  . CAD, NATIVE VESSEL 05/16/2010   Phillips Grout PT, DPT, GCS  , 07/28/2019, 10:13 AM  [place] MAIN [facility] SERVICES [address] [place], [place], [address] Phone: [phone]   Fax:  [phone]  Name: Xaine Sansom [facility]. MRN: [id number] Date of Birth: November 14, 1933

## 2019-07-30 ENCOUNTER — Other Ambulatory Visit: Payer: Self-pay

## 2019-07-30 ENCOUNTER — Ambulatory Visit: Payer: Medicare Other

## 2019-07-30 DIAGNOSIS — G8929 Other chronic pain: Secondary | ICD-10-CM

## 2019-07-30 DIAGNOSIS — M545 Low back pain, unspecified: Secondary | ICD-10-CM

## 2019-07-30 DIAGNOSIS — M6281 Muscle weakness (generalized): Secondary | ICD-10-CM | POA: Diagnosis not present

## 2019-07-30 NOTE — Therapy (Signed)
[facility] [facility] [address] [place], [place], [address] Phone: [phone]   Fax:  [phone]  Physical Therapy Treatment  Patient Details  Name: Calvin AmberGeorge Stanford Harmsen Jr. MRN: [id number] Date of Birth: 1933-09-02 Referring Provider (PT): [name]   Encounter Date: 07/30/2019  PT End of Session - 07/30/19 0937    Visit Number  4    Number of Visits  25    Date for PT Re-Evaluation  10/06/19    Authorization Type  Eval 8/11/0    PT Start Time  0930    PT Stop Time  1015    PT Time Calculation (min)  45 min    Activity Tolerance  Patient tolerated treatment well    Behavior During Therapy  [facility] for tasks assessed/performed       Past Medical History:  Diagnosis Date  . Acute embolic stroke (HCC) 02/01/2017  . BPH (benign prostatic hyperplasia)   . Bradycardia 11/10/2015  . Coronary artery disease   . Dizziness 08/21/2012  . DYSPEPSIA 07/31/2010   Qualifier: Diagnosis of  By: Dalphine Handinghompson, RN, Ashley    . Hyperlipidemia 05/16/2010   Qualifier: Diagnosis of  By: Mariah MillingGollan MD, [name]     . Hypertension   . HYPERTENSION, BENIGN 05/16/2010   Qualifier: Diagnosis of  By: Mariah MillingGollan MD, [name]    . Stenosis of right carotid artery 02/01/2017    Past Surgical History:  Procedure Laterality Date  . HIP SURGERY     right hip   . Intestinal Blockage    . LOOP RECORDER INSERTION N/A 05/14/2017   Procedure: Loop Recorder Insertion;  Surgeon: [name], Steven C, MD;  [facility];  Service: Cardiovascular;  Laterality: N/A;  . SKIN CANCER EXCISION     x2  . TONSILLECTOMY      There were no vitals filed for this visit.  Subjective Assessment - 07/30/19 0930    Subjective  Pt reports feeling sore after the last session however the following day was the 'best day he's had getting out of bed.' He feels like his back pain has gotten about 50% better in regards to getting out of bed since starting physical therapy for LBP. He states he  walked a mile 3-4 days in a row which is something he hasn't been able to do in a long time with minimal back pain towards to end. No questions or concerns at this time.    Pertinent History  Pt states that his LBP started a few years ago, but doesn't recall any specific MOI.  He states that [name] suggested taking tyenol PM, which has given him some relief.  He states that it is worse when getting out of bed in AM, as well as with tying his shoes.  He states that he fatigues easily with trunk flexion and that it brings on pain.  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  He denies any numbness and tingling, but does report some referred pain into bilateral posterior thigh that stays above his knee.    Limitations  Sitting;Walking;Standing;Lifting    How long can you sit comfortably?  5-15 min in a car; up to 30-45 min in a supportive chair    How long can you stand comfortably?  pain begins almost immediately    How long can you walk comfortably?  more limited due to LE's, not LBP  Diagnostic tests  x-rays    Patient Stated Goals  to decrease back pain    Currently in Pain?  Yes    Pain Score  2     Pain Location  Back    Pain Orientation  Lower    Pain Descriptors / Indicators  Aching;Sore    Pain Type  Chronic pain         TREATMENT    Therapeutic Exercise  NuStep L4 x 5 minutes during history  (2 minutes unbilled); Repeated extensions 2x10 in standing with verbal and tactile cueing to keep hips stable.   Bent knee fall outs with 5s hold x15 in each direction; Anterior and posterior tilts with 3-5s hold x10 each direction with min cueing to arch and flatten back against table initially; Hooklying bridges 3-5s hold  x10  Hooklying resisted lumbar rotation x10 each side     Manual Therapy   CPA joint mobs T10-L5  grade II 2 x 20s bouts/level;   UPA joint mobs L1-L5  grade II x 20s bouts/level bilateral;    Extensive IASTM of bilateral lumbar erector spinae x 5 minutes;     Pt educated throughout session about proper posture and technique with exercises. Improved exercise technique, movement at target joints, use of target muscles after min to mod verbal, visual, tactile cues.                 Pt demonstrated excellent motivation throughout today's session. He demonstrated improved form with repeated extension in standing and had reports of pain relief post exercise. He continues to lack significant lumbar extension mobility. He experienced mild reproduction of pain with lumbar CPA and UPA mobilizations and IASTM of lumbar erector spinae musculature however had reports of decreased pain and discomfort post treatment. He will continue to benefit from skilled PT services in order to decrease his pain and to increase his spinal mobility in order to improve to full function at home and to improve his QOL.                PT Education - 07/30/19 0937    Education Details  Exercise technique    Person(s) Educated  Patient    Methods  Explanation;Demonstration;Verbal cues;Tactile cues    Comprehension  Tactile cues required;Verbal cues required;Returned demonstration;Verbalized understanding       PT Short Term Goals - 07/14/19 1310      PT SHORT TERM GOAL #1   Title  Pt will be independent with HEP in order to improve strength and decrease back pain in order to improve pain-free function at home.    Time  6    Period  Weeks    Status  New    Target Date  08/25/19        PT Long Term Goals - 07/16/19 1408      PT LONG TERM GOAL #1   Title  Pt will decrease worst back pain as reported on NPRS by at least 2 points in order to demonstrate clinically significant reduction in back pain.    Baseline  07/14/19: worst = 4/10    Time  12    Period  Weeks    Status  New    Target Date  10/06/19      PT LONG TERM GOAL #2   Title  Pt will increase strength of by at least 1/2 MMT grade in  order to demonstrate improvement in strength and function.    Baseline  07/14/19: B  hip flexion 4/5, R hip ER 4+/5, B hip abduction 4+, B hip adduction 4/5, B hip extension 4/5    Time  12    Period  Weeks    Status  New    Target Date  10/06/19      PT LONG TERM GOAL #3   Title  Pt will be able to sit for at least 30 minutes while riding in a car withou tincrease in his back pain in order to improve tolerance for travel to appointments and personal events.    Baseline  07/14/19: 5-15 minutes before back pain increases    Time  12    Period  Weeks    Status  New    Target Date  10/06/19      PT LONG TERM GOAL #4   Title  Pt will decrease mODI scoreby at least 13 points in order demonstrate clinically significant reduction in back pain/disability.    Baseline  07/14/19: Pt to complete at next visit; 07/16/19: 24/50=48%    Time  12    Period  Weeks    Status  New    Target Date  10/06/19            Plan - 07/30/19 1093    Clinical Impression Statement  Pt demonstrated excellent motivation throughout today's session. He demonstrated improved form with repeated extension in standing and had reports of pain relief post exercise. He continues to lack significant lumbar extension mobility. He experienced mild reproduction of pain with lumbar CPA and UPA mobilizations and IASTM of lumbar erector spinae musculature however had reports of decreased pain and discomfort post treatment. He will continue to benefit from skilled PT services in order to decrease his pain and to increase his spinal mobility in order to improve to full function at home and to improve his QOL.    Personal Factors and Comorbidities  Age;Behavior Pattern;Comorbidity 3+;Time since onset of injury/illness/exacerbation    Comorbidities  hx of stroke, CAD, HLD, HTN, age, chronicity of onset, hx of hip surgery    Examination-Activity Limitations  Sit;Sleep;Lift;Bend;Squat;Stand    Examination-Participation Restrictions   Community Activity;Yard Work    Conservation officer, historic buildings  Evolving/Moderate complexity    Rehab Potential  Fair    PT Frequency  2x / week    PT Duration  12 weeks    PT Treatment/Interventions  Canalith Repostioning;Cryotherapy;Electrical Stimulation;Iontophoresis 4mg /ml Dexamethasone;Moist Heat;Traction;Ultrasound;Functional mobility training;Therapeutic activities;Therapeutic exercise;Balance training;Neuromuscular re-education;Patient/family education;Manual techniques;Dry needling;Spinal Manipulations;Joint Manipulations;Aquatic Therapy;Passive range of motion;Vestibular;Vasopneumatic Device    PT Next Visit Plan  CPAs and UPA's of lumbar spine; manual therapy    PT Home Exercise Plan  Access Code: [email or web address] (Hooklying lumbar rotation, ant/post pelvic tilt),  lying in prone intermittently throughout the day for 3-5 minutes at a time    Consulted and Agree with Plan of Care  Patient       Patient will benefit from skilled therapeutic intervention in order to improve the following deficits and impairments:  Difficulty walking, Hypomobility, Increased muscle spasms, Pain, Impaired flexibility, Decreased strength, Decreased activity tolerance, Abnormal gait  Visit Diagnosis: Chronic bilateral low back pain without sciatica     Problem List Patient Active Problem List   Diagnosis Date Noted  . Stenosis of right carotid artery 02/01/2017  . Acute embolic stroke (HCC) 02/01/2017  . Bradycardia 11/10/2015  . Essential tremor 06/03/2015  . Dizziness 08/21/2012  . Syncope 04/10/2011  . CALF PAIN, LEFT 10/11/2010  . DYSPEPSIA 07/31/2010  . CHEST DISCOMFORT 07/31/2010  .  Hyperlipidemia 05/16/2010  . HYPERTENSION, BENIGN 05/16/2010  . CAD, NATIVE VESSEL 05/16/2010   This entire session was performed under direct supervision and direction of a licensed therapist/therapist assistant . I have personally read, edited and approve of the note as written.   Elmyra Ricks Zi Sek SPT Phillips Grout PT, DPT, GCS  [name],[name] 07/30/2019, 4:38 PM  [place] MAIN [facility] SERVICES [address] [place], [place], [address] Phone: [phone]   Fax:  [phone]  Name: Calvin Simonis [facility]. MRN: [id number] Date of Birth: 22-Dec-1932

## 2019-08-03 NOTE — Progress Notes (Signed)
[facility] Summary Report / Loop Recorder 

## 2019-08-04 ENCOUNTER — Ambulatory Visit: Payer: Medicare Other

## 2019-08-06 ENCOUNTER — Ambulatory Visit: Payer: Medicare Other

## 2019-08-11 ENCOUNTER — Ambulatory Visit: Payer: Medicare Other | Attending: Orthopedic Surgery

## 2019-08-11 ENCOUNTER — Other Ambulatory Visit: Payer: Self-pay

## 2019-08-11 DIAGNOSIS — M545 Low back pain, unspecified: Secondary | ICD-10-CM

## 2019-08-11 DIAGNOSIS — R2681 Unsteadiness on feet: Secondary | ICD-10-CM | POA: Insufficient documentation

## 2019-08-11 DIAGNOSIS — G8929 Other chronic pain: Secondary | ICD-10-CM | POA: Diagnosis present

## 2019-08-11 DIAGNOSIS — M6281 Muscle weakness (generalized): Secondary | ICD-10-CM | POA: Insufficient documentation

## 2019-08-11 NOTE — Therapy (Signed)
[place] MAIN [facility] SERVICES [address] [place], [place], [address] Phone: [phone]   Fax:  [phone]  Physical Therapy Treatment  Patient Details  Name: [name]. MRN: [id number] Date of Birth: 03/19/1933 Referring Provider (PT): [name]   Encounter Date: 08/11/2019  PT End of Session - 08/11/19 1152    Visit Number  5    Number of Visits  25    Date for PT Re-Evaluation  10/06/19    Authorization Type  Eval 8/11/0    PT Start Time  0930    PT Stop Time  1015    PT Time Calculation (min)  45 min    Activity Tolerance  Patient tolerated treatment well    Behavior During Therapy  [facility] for tasks assessed/performed       Past Medical History:  Diagnosis Date  . Acute embolic stroke ([place]) 7/0/9628  . BPH (benign prostatic hyperplasia)   . Bradycardia 11/10/2015  . Coronary artery disease   . Dizziness 08/21/2012  . DYSPEPSIA 07/31/2010   Qualifier: Diagnosis of  By: [name]    . Hyperlipidemia 05/16/2010   Qualifier: Diagnosis of  By: [name], [name]     . Hypertension   . HYPERTENSION, BENIGN 05/16/2010   Qualifier: Diagnosis of  By: [name], [name]    . Stenosis of right carotid artery 02/01/2017    Past Surgical History:  Procedure Laterality Date  . HIP SURGERY     right hip   . Intestinal Blockage    . LOOP RECORDER INSERTION N/A 05/14/2017   Procedure: Loop Recorder Insertion;  Surgeon: [name], MD;  Location: [place] [facility];  Service: Cardiovascular;  Laterality: N/A;  . SKIN CANCER EXCISION     x2  . TONSILLECTOMY      There were no vitals filed for this visit.  Subjective Assessment - 08/11/19 0937    Subjective  Pt denies any pain upon arrival today. He repors that he feels like his back pain is improving but he continues to have pain in his back with forward bending. He has little recollection of how he felt following his last therapy session. No specific questions or  concerns upon arrival today.    Pertinent History  Pt states that his LBP started a few years ago, but doesn't recall any specific MOI.  He states that Dr. Marry Guan suggested taking tyenol PM, which has given him some relief.  He states that it is worse when getting out of bed in AM, as well as with tying his shoes.  He states that he fatigues easily with trunk flexion and that it brings on pain.  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  He denies any numbness and tingling, but does report some referred pain into bilateral posterior thigh that stays above his knee.    Limitations  Sitting;Walking;Standing;Lifting    How long can you sit comfortably?  5-15 min in a car; up to 30-45 min in a supportive chair    How long can you stand comfortably?  pain begins almost immediately    How long can you walk comfortably?  more limited due to LE's, not LBP    Diagnostic tests  x-rays    Patient Stated Goals  to decrease back pain    Currently in Pain?  No/denies  TREATMENT   Therapeutic Exercise Octane L4 x 4 minutes for warm-up during history with moist heat pack on low back; Repeatedextensions x10 in standing withverbal and tactilecueing to keep hips stable, pt was performing incorrectly and forward flexing after extension. Correction provided; Bent knee fall outswith 5s holdx 10 in each direction; Anterior and posterior tiltswith 3-5s hold x10 each directionwith mincueing to arch and flatten back against table initially;    Manual Therapy  CPA joint mobsL1-L5gradeIIx30sbouts/level;  UPA joint mobsL1-L5gradeII x20sbouts/level bilateral;  IASTM of bilateral lumbarerector spinae x 5 minutes; Prone on elbows L3 grade III mobilizations x 30s;   Trigger Point Dry Needling (TDN), unbilled Education performed with patient regarding potential benefit of TDN. Reviewed precautions and risks with  patient. Extensive time spent with pt to ensure full understanding of TDN risks. Pt provided verbal consent to treatment. TDN performed to bilateral lumbar multifidi approximately L3 and L5 with 4, 0.30 x 60 single needle placements (1 at each level on each side) with local twitch response (LTR). Pistoning technique utilized..     Pt educated throughout session about proper posture and technique with exercises. Improved exercise technique, movement at target joints, use of target muscles after min to mod verbal, visual, tactile cues.    Pt demonstrated excellent motivation throughout today's session. He continues to lack significant lumbar extension mobility and was incorrectly performing repeated standing extension. He experienced mild reproduction of pain with lumbar CPA and UPA mobilizations and IASTM of lumbar erector spinae musculature. Utilized TDN today and will assess response at next session. Pt will continue to benefit from skilled PT services in order to decrease his pain and to increase his spinal mobility in order to improve to full function at home and to improve his QOL.                       PT Short Term Goals - 07/14/19 1310      PT SHORT TERM GOAL #1   Title  Pt will be independent with HEP in order to improve strength and decrease back pain in order to improve pain-free function at home.    Time  6    Period  Weeks    Status  New    Target Date  08/25/19        PT Long Term Goals - 07/16/19 1408      PT LONG TERM GOAL #1   Title  Pt will decrease worst back pain as reported on NPRS by at least 2 points in order to demonstrate clinically significant reduction in back pain.    Baseline  07/14/19: worst = 4/10    Time  12    Period  Weeks    Status  New    Target Date  10/06/19      PT LONG TERM GOAL #2   Title  Pt will increase strength of by at least 1/2 MMT grade in order to demonstrate improvement in strength and function.     Baseline  07/14/19: B hip flexion 4/5, R hip ER 4+/5, B hip abduction 4+, B hip adduction 4/5, B hip extension 4/5    Time  12    Period  Weeks    Status  New    Target Date  10/06/19      PT LONG TERM GOAL #3   Title  Pt will be able to sit for at least 30 minutes while riding in a car withou tincrease in his  back pain in order to improve tolerance for travel to appointments and personal events.    Baseline  07/14/19: 5-15 minutes before back pain increases    Time  12    Period  Weeks    Status  New    Target Date  10/06/19      PT LONG TERM GOAL #4   Title  Pt will decrease mODI scoreby at least 13 points in order demonstrate clinically significant reduction in back pain/disability.    Baseline  07/14/19: Pt to complete at next visit; 07/16/19: 24/50=48%    Time  12    Period  Weeks    Status  New    Target Date  10/06/19            Plan - 08/11/19 1202    Clinical Impression Statement  Pt demonstrated excellent motivation throughout today's session. He continues to lack significant lumbar extension mobility and was incorrectly performing repeated standing extension. He experienced mild reproduction of pain with lumbar CPA and UPA mobilizations and IASTM of lumbar erector spinae musculature. Utilized TDN today and will assess response at next session. Pt will continue to benefit from skilled PT services in order to decrease his pain and to increase his spinal mobility in order to improve to full function at home and to improve his QOL.    Personal Factors and Comorbidities  Age;Behavior Pattern;Comorbidity 3+;Time since onset of injury/illness/exacerbation    Comorbidities  hx of stroke, CAD, HLD, HTN, age, chronicity of onset, hx of hip surgery    Examination-Activity Limitations  Sit;Sleep;Lift;Bend;Squat;Stand    Examination-Participation Restrictions  Community Activity;Yard Work    Conservation officer, historic buildings  Evolving/Moderate complexity    Rehab Potential  Fair    PT  Frequency  2x / week    PT Duration  12 weeks    PT Treatment/Interventions  Canalith Repostioning;Cryotherapy;Electrical Stimulation;Iontophoresis 4mg /ml Dexamethasone;Moist Heat;Traction;Ultrasound;Functional mobility training;Therapeutic activities;Therapeutic exercise;Balance training;Neuromuscular re-education;Patient/family education;Manual techniques;Dry needling;Spinal Manipulations;Joint Manipulations;Aquatic Therapy;Passive range of motion;Vestibular;Vasopneumatic Device    PT Next Visit Plan  Assess response to TDN, CPAs and UPA's of lumbar spine; manual therapy    PT Home Exercise Plan  Access Code: [email or web address] (Hooklying lumbar rotation, ant/post pelvic tilt),  lying in prone intermittently throughout the day for 3-5 minutes at a time    Consulted and Agree with Plan of Care  Patient       Patient will benefit from skilled therapeutic intervention in order to improve the following deficits and impairments:  Difficulty walking, Hypomobility, Increased muscle spasms, Pain, Impaired flexibility, Decreased strength, Decreased activity tolerance, Abnormal gait  Visit Diagnosis: Chronic bilateral low back pain without sciatica  Muscle weakness (generalized)     Problem List Patient Active Problem List   Diagnosis Date Noted  . Stenosis of right carotid artery 02/01/2017  . Acute embolic stroke ([facility]) 02/01/2017  . Bradycardia 11/10/2015  . Essential tremor 06/03/2015  . Dizziness 08/21/2012  . Syncope 04/10/2011  . CALF PAIN, LEFT 10/11/2010  . DYSPEPSIA 07/31/2010  . CHEST DISCOMFORT 07/31/2010  . Hyperlipidemia 05/16/2010  . HYPERTENSION, BENIGN 05/16/2010  . CAD, NATIVE VESSEL 05/16/2010   Lynnea Maizes PT, DPT, GCS  [name] 08/11/2019, 1:39 PM  [facility] MAIN [facility] SERVICES [address] [place], [place], [address] Phone: [phone]   Fax:  [phone]  Name: [name] [facility]. MRN: [id number] Date of Birth:  Mar 16, 1933

## 2019-08-13 ENCOUNTER — Ambulatory Visit: Payer: Medicare Other

## 2019-08-13 ENCOUNTER — Other Ambulatory Visit: Payer: Self-pay

## 2019-08-13 DIAGNOSIS — M545 Low back pain, unspecified: Secondary | ICD-10-CM

## 2019-08-13 DIAGNOSIS — M6281 Muscle weakness (generalized): Secondary | ICD-10-CM

## 2019-08-13 DIAGNOSIS — G8929 Other chronic pain: Secondary | ICD-10-CM

## 2019-08-13 NOTE — Therapy (Signed)
[place] [facility] MAIN [facility] SERVICES [address] [place], [place], [address] Phone: [phone]   Fax:  [phone]  Physical Therapy Treatment  Patient Details  Name: [name]. MRN: [id number] Date of Birth: August 16, 1933 Referring Provider (PT): Dr. [name]   Encounter Date: 08/13/2019  PT End of Session - 08/13/19 0932    Visit Number  6    Number of Visits  25    Date for PT Re-Evaluation  10/06/19    Authorization Type  Eval 8/11/0    PT Start Time  0930    PT Stop Time  1015    PT Time Calculation (min)  45 min    Activity Tolerance  Patient tolerated treatment well    Behavior During Therapy  [facility] for tasks assessed/performed       Past Medical History:  Diagnosis Date  . Acute embolic stroke ([facility]) 02/01/2017  . BPH (benign prostatic hyperplasia)   . Bradycardia 11/10/2015  . Coronary artery disease   . Dizziness 08/21/2012  . DYSPEPSIA 07/31/2010   Qualifier: Diagnosis of  By: Dalphine Handing    . Hyperlipidemia 05/16/2010   Qualifier: Diagnosis of  By: Mariah Milling MD, [name]     . Hypertension   . HYPERTENSION, BENIGN 05/16/2010   Qualifier: Diagnosis of  By: Mariah Milling MD, [name]    . Stenosis of right carotid artery 02/01/2017    Past Surgical History:  Procedure Laterality Date  . HIP SURGERY     right hip   . Intestinal Blockage    . LOOP RECORDER INSERTION N/A 05/14/2017   Procedure: Loop Recorder Insertion;  Surgeon: Duke Salvia, MD;  Location: [facility] INVASIVE CV [facility];  Service: Cardiovascular;  Laterality: N/A;  . SKIN CANCER EXCISION     x2  . TONSILLECTOMY      There were no vitals filed for this visit.  Subjective Assessment - 08/13/19 0938    Subjective  Pt reports that his low back and legs are aching today.  He reports soreness along "the bones".  No specific questions or concerns upon arrival today.    Pertinent History  Pt states that his LBP started a few years ago, but doesn't recall any specific MOI.  He  states that Dr. Ernest Pine suggested taking tyenol PM, which has given him some relief.  He states that it is worse when getting out of bed in AM, as well as with tying his shoes.  He states that he fatigues easily with trunk flexion and that it brings on pain.  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  He denies any numbness and tingling, but does report some referred pain into bilateral posterior thigh that stays above his knee.    Limitations  Sitting;Walking;Standing;Lifting    How long can you sit comfortably?  5-15 min in a car; up to 30-45 min in a supportive chair    How long can you stand comfortably?  pain begins almost immediately    How long can you walk comfortably?  more limited due to LE's, not LBP    Diagnostic tests  x-rays    Patient Stated Goals  to decrease back pain    Currently in Pain?  Yes    Pain Score  3     Pain Location  Back    Pain Orientation  Lower    Pain Descriptors / Indicators  Aching;Sore  Pain Type  Chronic pain       TREATMENT    Therapeutic Exercise    Octane L4 x 5 minutes for warm-up during history with moist heat pack on low back;   Repeated extensions 2x10 in standing with with 5 sec hold verbal and tactile cueing to keep hips stable.  1 set performed before interventions and 1 set performed after interventions; improvement with ROM and technique during 2nd set after extensive CPA and UPA joint mobs of T11-L5.   Bent knee fall outs with 5s hold x 10 in each direction; going to the R feels more of a stretch  Knee to chest stretch on L 3x30sec.  Hamstring stretch on R 3x30sec due to pinching in R hip  FADIR stretch 3x30sec bilaterally  Anterior and posterior tilts with 3-5s hold x10 each direction with min cueing to arch and flatten back against table initially;        Manual Therapy     CPA joint mobs L1-L5  grade II x 30s 3 bouts/level;     UPA joint mobs L1-L5  grade II  x 20s 3 bouts/level bilateral;     Prone on elbows L1-L3 grade III mobilizations x 30s;       Pt educated throughout session about proper posture and technique with exercises. Improved exercise technique, movement at target joints, use of target muscles after min to mod verbal, visual, tactile cues.                    Pt demonstrated excellent motivation throughout today's session. He continues to lack significant lumbar extension mobility, especially between L1-L3.  He demonstrates increased ROM and technique with repeated extensions following CPA and UPA mobs today.   Pt will continue to benefit from skilled PT services in order to decrease his pain and to increase his spinal mobility in order to improve to full function at home and to improve his QOL.          PT Short Term Goals - 07/14/19 1310      PT SHORT TERM GOAL #1   Title  Pt will be independent with HEP in order to improve strength and decrease back pain in order to improve pain-free function at home.    Time  6    Period  Weeks    Status  New    Target Date  08/25/19        PT Long Term Goals - 07/16/19 1408      PT LONG TERM GOAL #1   Title  Pt will decrease worst back pain as reported on NPRS by at least 2 points in order to demonstrate clinically significant reduction in back pain.    Baseline  07/14/19: worst = 4/10    Time  12    Period  Weeks    Status  New    Target Date  10/06/19      PT LONG TERM GOAL #2   Title  Pt will increase strength of by at least 1/2 MMT grade in order to demonstrate improvement in strength and function.    Baseline  07/14/19: B hip flexion 4/5, R hip ER 4+/5, B hip abduction 4+, B hip adduction 4/5, B hip extension 4/5    Time  12    Period  Weeks    Status  New    Target Date  10/06/19      PT LONG TERM GOAL #3   Title  Pt  will be able to sit for at least 30 minutes while riding in a car withou tincrease in his back pain in order to improve tolerance for travel to  appointments and personal events.    Baseline  07/14/19: 5-15 minutes before back pain increases    Time  12    Period  Weeks    Status  New    Target Date  10/06/19      PT LONG TERM GOAL #4   Title  Pt will decrease mODI scoreby at least 13 points in order demonstrate clinically significant reduction in back pain/disability.    Baseline  07/14/19: Pt to complete at next visit; 07/16/19: 24/50=48%    Time  12    Period  Weeks    Status  New    Target Date  10/06/19            Plan - 08/13/19 1134    Clinical Impression Statement  Pt demonstrated excellent motivation throughout today's session. He continues to lack significant lumbar extension mobility, especially between L1-L3.  He demonstrates increased ROM and technique with repeated extensions following CPA and UPA mobs today.   Pt will continue to benefit from skilled PT services in order to decrease his pain and to increase his spinal mobility in order to improve to full function at home and to improve his QOL.    Personal Factors and Comorbidities  Age;Behavior Pattern;Comorbidity 3+;Time since onset of injury/illness/exacerbation    Comorbidities  hx of stroke, CAD, HLD, HTN, age, chronicity of onset, hx of hip surgery    Examination-Activity Limitations  Sit;Sleep;Lift;Bend;Squat;Stand    Examination-Participation Restrictions  Community Activity;Yard Work    Conservation officer, historic buildingstability/Clinical Decision Making  Evolving/Moderate complexity    Rehab Potential  Fair    PT Frequency  2x / week    PT Duration  12 weeks    PT Treatment/Interventions  Canalith Repostioning;Cryotherapy;Electrical Stimulation;Iontophoresis 4mg /ml Dexamethasone;Moist Heat;Traction;Ultrasound;Functional mobility training;Therapeutic activities;Therapeutic exercise;Balance training;Neuromuscular re-education;Patient/family education;Manual techniques;Dry needling;Spinal Manipulations;Joint Manipulations;Aquatic Therapy;Passive range of motion;Vestibular;Vasopneumatic  Device    PT Next Visit Plan  Assess response to TDN, CPAs and UPA's of lumbar spine; manual therapy    PT Home Exercise Plan  Access Code: [email or web address] (Hooklying lumbar rotation, ant/post pelvic tilt),  lying in prone intermittently throughout the day for 3-5 minutes at a time    Consulted and Agree with Plan of Care  Patient       Patient will benefit from skilled therapeutic intervention in order to improve the following deficits and impairments:  Difficulty walking, Hypomobility, Increased muscle spasms, Pain, Impaired flexibility, Decreased strength, Decreased activity tolerance, Abnormal gait  Visit Diagnosis: Chronic bilateral low back pain without sciatica  Muscle weakness (generalized)     Problem List Patient Active Problem List   Diagnosis Date Noted  . Stenosis of right carotid artery 02/01/2017  . Acute embolic stroke (HCC) 02/01/2017  . Bradycardia 11/10/2015  . Essential tremor 06/03/2015  . Dizziness 08/21/2012  . Syncope 04/10/2011  . CALF PAIN, LEFT 10/11/2010  . DYSPEPSIA 07/31/2010  . CHEST DISCOMFORT 07/31/2010  . Hyperlipidemia 05/16/2010  . HYPERTENSION, BENIGN 05/16/2010  . CAD, NATIVE VESSEL 05/16/2010    This entire session was performed under direct supervision and direction of a licensed therapist/therapist assistant . I have personally read, edited and approve of the note as written.   Ronie Spiesracy Dare Spillman, SPT Lynnea MaizesJason D Huprich PT, DPT, GCS  Huprich,Jason 08/13/2019, 2:35 PM  [place] [facility] [address] [place], [place], [address] Phone: [phone]   Fax:  [phone]  Name: Calvin ShortsGeorge Stanford [facility] HospitalBason Jr. MRN: [id number] Date of Birth: 1932-12-15

## 2019-08-18 ENCOUNTER — Other Ambulatory Visit: Payer: Self-pay

## 2019-08-18 ENCOUNTER — Ambulatory Visit: Payer: Medicare Other

## 2019-08-18 DIAGNOSIS — M545 Low back pain, unspecified: Secondary | ICD-10-CM

## 2019-08-18 DIAGNOSIS — G8929 Other chronic pain: Secondary | ICD-10-CM

## 2019-08-18 DIAGNOSIS — M6281 Muscle weakness (generalized): Secondary | ICD-10-CM

## 2019-08-18 NOTE — Therapy (Signed)
[place] MAIN [facility] SERVICES [address] [place], [place], [address] Phone: [phone]   Fax:  [phone]  Physical Therapy Treatment  Patient Details  Name: [name]. MRN: [id number] Date of Birth: 18-Aug-1933 Referring Provider (PT): [name]   Encounter Date: 08/18/2019  PT End of Session - 08/18/19 1238    Visit Number  7    Number of Visits  25    Date for PT Re-Evaluation  10/06/19    Authorization Type  Eval 8/11/0    PT Start Time  0930    PT Stop Time  1015    PT Time Calculation (min)  45 min    Activity Tolerance  Patient tolerated treatment well    Behavior During Therapy  [facility] for tasks assessed/performed       Past Medical History:  Diagnosis Date  . Acute embolic stroke ([place]) 08/10/3531  . BPH (benign prostatic hyperplasia)   . Bradycardia 11/10/2015  . Coronary artery disease   . Dizziness 08/21/2012  . DYSPEPSIA 07/31/2010   Qualifier: Diagnosis of  By: [name]    . Hyperlipidemia 05/16/2010   Qualifier: Diagnosis of  By: [name], [name]     . Hypertension   . HYPERTENSION, BENIGN 05/16/2010   Qualifier: Diagnosis of  By: [name], [name]    . Stenosis of right carotid artery 02/01/2017    Past Surgical History:  Procedure Laterality Date  . HIP SURGERY     right hip   . Intestinal Blockage    . LOOP RECORDER INSERTION N/A 05/14/2017   Procedure: Loop Recorder Insertion;  Surgeon: [name], MD;  Location: [place] CV [facility];  Service: Cardiovascular;  Laterality: N/A;  . SKIN CANCER EXCISION     x2  . TONSILLECTOMY      There were no vitals filed for this visit.  Subjective Assessment - 08/18/19 0953    Subjective  Pt reports that his low back continues to be more sore in the AM.  He reports that his pain had initially increased last week, stating that he had been performing forward bending for an HEP instead of repeated extension, but felt more relief once he started  performing them in both direction. No specific questions or concerns upon arrival today.    Pertinent History  Pt states that his LBP started a few years ago, but doesn't recall any specific MOI.  He states that Dr. Marry Guan suggested taking tyenol PM, which has given him some relief.  He states that it is worse when getting out of bed in AM, as well as with tying his shoes.  He states that he fatigues easily with trunk flexion and that it brings on pain.  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  He denies any numbness and tingling, but does report some referred pain into bilateral posterior thigh that stays above his knee.    Limitations  Sitting;Walking;Standing;Lifting    How long can you sit comfortably?  5-15 min in a car; up to 30-45 min in a supportive chair    How long can you stand comfortably?  pain begins almost immediately    How long can you walk comfortably?  more limited due to LE's, not LBP    Diagnostic tests  x-rays    Patient Stated Goals  to decrease back pain    Currently in Pain?  No/denies          TREATMENT      Therapeutic Exercise      Octane L4 x 5 minutes for warm-up during history with moist heat pack on low back;     Repeated extensions 2x10 in standing with with 5 sec hold verbal and tactile cueing to keep hips stable.  Subjectively states that he feels relief after 1 set.   Bent knee fall outs with 5s hold x 3 in each direction; going to the R feels more of a pinch   Bilateral hamstring stretch  3x30sec  FADIR stretch 3x30sec bilaterally    L FABER stretch 3x30sec, but feels pinching in hip on R      Manual Therapy       CPA joint mobs L1-L5  grade II x 30s 3 bouts/level;       UPA joint mobs L1-L5  grade II x 20s 3 bouts/level bilateral;       sidelying lumbar rotational joint mobs L1-L5 grade II-III x20s 3 bouts/level performed bilaterally  Extensive STM of the lumbar  paraspinals and lumbar fascia      Trigger Point Dry Needling (TDN), unbilled Education performed with patient regarding potential benefit of TDN. Reviewed precautions and risks with patient. Reviewed special precautions/risks over lung fields which include pneumothorax. Reviewed signs and symptoms of pneumothorax and advised pt to go to ER immediately if these symptoms develop advise them of dry needling treatment. Extensive time spent with pt to ensure full understanding of TDN risks. Pt provided verbal consent to treatment. TDN performed to lumbar paraspinals bilaterally around L3/4 with 0.35 x 75 single needle placements with local twitch response (LTR). Pistoning technique utilized. Improved pain-free motion following intervention.        Pt educated throughout session about proper posture and technique with exercises. Improved exercise technique, movement at target joints, use of target muscles after min to mod verbal, visual, tactile cues.                   Pt demonstrated excellent motivation throughout today's session.  He reports relief of stiffness after repeated lumbar extensions, joint mobilizations, stretching, TDN and extensive STM.  He states that he had been performing repeated trunk flexion exercises, stating he had an increase in pain.  He was reminded again to try to avoid repeated trunk flexion, and instead to utilize repeated extensions.  HEP was reviewed and he demonstrates and verbalizes understanding with repeated extensions.  Pt will continue to benefit from skilled PT services in order to decrease his pain and to increase his spinal mobility in order to improve to full function at home and to improve his QOL.          PT Short Term Goals - 07/14/19 1310      PT SHORT TERM GOAL #1   Title  Pt will be independent with HEP in order to improve strength and decrease back pain in order to improve pain-free function at home.    Time  6    Period  Weeks    Status  New     Target Date  08/25/19        PT Long Term Goals - 07/16/19 1408      PT LONG TERM GOAL #1   Title  Pt will decrease worst back pain as reported on NPRS by at least 2 points in order to demonstrate clinically significant reduction in back pain.    Baseline  07/14/19:  worst = 4/10    Time  12    Period  Weeks    Status  New    Target Date  10/06/19      PT LONG TERM GOAL #2   Title  Pt will increase strength of by at least 1/2 MMT grade in order to demonstrate improvement in strength and function.    Baseline  07/14/19: B hip flexion 4/5, R hip ER 4+/5, B hip abduction 4+, B hip adduction 4/5, B hip extension 4/5    Time  12    Period  Weeks    Status  New    Target Date  10/06/19      PT LONG TERM GOAL #3   Title  Pt will be able to sit for at least 30 minutes while riding in a car withou tincrease in his back pain in order to improve tolerance for travel to appointments and personal events.    Baseline  07/14/19: 5-15 minutes before back pain increases    Time  12    Period  Weeks    Status  New    Target Date  10/06/19      PT LONG TERM GOAL #4   Title  Pt will decrease mODI scoreby at least 13 points in order demonstrate clinically significant reduction in back pain/disability.    Baseline  07/14/19: Pt to complete at next visit; 07/16/19: 24/50=48%    Time  12    Period  Weeks    Status  New    Target Date  10/06/19            Plan - 08/18/19 1246    Clinical Impression Statement  Pt demonstrated excellent motivation throughout today's session.  He reports relief of stiffness after repeated lumbar extensions, joint mobilizations, stretching, TDN and extensive STM.  He states that he had been performing repeated trunk flexion exercises, stating he had an increase in pain.  He was reminded again to try to avoid repeated trunk flexion, and instead to utilize repeated extensions.  HEP was reviewed and he demonstrates and verbalizes understanding with repeated extensions.  Pt  will continue to benefit from skilled PT services in order to decrease his pain and to increase his spinal mobility in order to improve to full function at home and to improve his QOL    Personal Factors and Comorbidities  Age;Behavior Pattern;Comorbidity 3+;Time since onset of injury/illness/exacerbation    Comorbidities  hx of stroke, CAD, HLD, HTN, age, chronicity of onset, hx of hip surgery    Examination-Activity Limitations  Sit;Sleep;Lift;Bend;Squat;Stand    Examination-Participation Restrictions  Community Activity;Yard Work    Conservation officer, historic buildingstability/Clinical Decision Making  Evolving/Moderate complexity    Rehab Potential  Fair    PT Frequency  2x / week    PT Duration  12 weeks    PT Treatment/Interventions  Canalith Repostioning;Cryotherapy;Electrical Stimulation;Iontophoresis 4mg /ml Dexamethasone;Moist Heat;Traction;Ultrasound;Functional mobility training;Therapeutic activities;Therapeutic exercise;Balance training;Neuromuscular re-education;Patient/family education;Manual techniques;Dry needling;Spinal Manipulations;Joint Manipulations;Aquatic Therapy;Passive range of motion;Vestibular;Vasopneumatic Device    PT Next Visit Plan  Assess response to TDN, CPAs and UPA's of lumbar spine; manual therapy    PT Home Exercise Plan  Access Code: [email or web address] (Hooklying lumbar rotation, ant/post pelvic tilt),  lying in prone intermittently throughout the day for 3-5 minutes at a time    Consulted and Agree with Plan of Care  Patient       Patient will benefit from skilled therapeutic intervention in order to improve the following deficits and impairments:  Difficulty  walking, Hypomobility, Increased muscle spasms, Pain, Impaired flexibility, Decreased strength, Decreased activity tolerance, Abnormal gait  Visit Diagnosis: Chronic bilateral low back pain without sciatica  Muscle weakness (generalized)     Problem List Patient Active Problem List   Diagnosis Date Noted  . Stenosis of right carotid  artery 02/01/2017  . Acute embolic stroke ([facility]) 02/01/2017  . Bradycardia 11/10/2015  . Essential tremor 06/03/2015  . Dizziness 08/21/2012  . Syncope 04/10/2011  . CALF PAIN, LEFT 10/11/2010  . DYSPEPSIA 07/31/2010  . CHEST DISCOMFORT 07/31/2010  . Hyperlipidemia 05/16/2010  . HYPERTENSION, BENIGN 05/16/2010  . CAD, NATIVE VESSEL 05/16/2010     Ronie Spiesracy Katalaya Beel, SPT Lynnea MaizesJason D Huprich PT, DPT, GCS  Huprich,Jason 08/18/2019, 4:00 PM  [facility] [address] [place], [place], [address] Phone: [phone]   Fax:  [phone]  Name: Calvin ShortsGeorge [facility]ason Jr. MRN: [id number] Date of Birth: December 15, 1932

## 2019-08-20 ENCOUNTER — Other Ambulatory Visit: Payer: Self-pay

## 2019-08-20 ENCOUNTER — Ambulatory Visit: Payer: Medicare Other

## 2019-08-20 DIAGNOSIS — M6281 Muscle weakness (generalized): Secondary | ICD-10-CM

## 2019-08-20 DIAGNOSIS — M545 Low back pain, unspecified: Secondary | ICD-10-CM

## 2019-08-20 DIAGNOSIS — G8929 Other chronic pain: Secondary | ICD-10-CM

## 2019-08-20 NOTE — Therapy (Signed)
[place] [facility] MAIN [facility] SERVICES [address] [place], [place], [address] Phone: [phone]   Fax:  [phone]  Physical Therapy Treatment  Patient Details  Name: [name]. MRN: [id number] Date of Birth: Jul 04, 1933 Referring Provider (PT): Dr. [name]   Encounter Date: 08/20/2019  PT End of Session - 08/20/19 0931    Visit Number  8    Number of Visits  25    Date for PT Re-Evaluation  10/06/19    Authorization Type  Eval 8/11/0    PT Start Time  0930    PT Stop Time  1015    PT Time Calculation (min)  45 min    Activity Tolerance  Patient tolerated treatment well    Behavior During Therapy  [facility] for tasks assessed/performed       Past Medical History:  Diagnosis Date  . Acute embolic stroke ([facility]) 02/01/2017  . BPH (benign prostatic hyperplasia)   . Bradycardia 11/10/2015  . Coronary artery disease   . Dizziness 08/21/2012  . DYSPEPSIA 07/31/2010   Qualifier: Diagnosis of  By: Dalphine Handing    . Hyperlipidemia 05/16/2010   Qualifier: Diagnosis of  By: Mariah Milling MD, [name]     . Hypertension   . HYPERTENSION, BENIGN 05/16/2010   Qualifier: Diagnosis of  By: Mariah Milling MD, [name]    . Stenosis of right carotid artery 02/01/2017    Past Surgical History:  Procedure Laterality Date  . HIP SURGERY     right hip   . Intestinal Blockage    . LOOP RECORDER INSERTION N/A 05/14/2017   Procedure: Loop Recorder Insertion;  Surgeon: Duke Salvia, MD;  Location: [facility] INVASIVE [facility];  Service: Cardiovascular;  Laterality: N/A;  . SKIN CANCER EXCISION     x2  . TONSILLECTOMY      There were no vitals filed for this visit.  Subjective Assessment - 08/20/19 0937    Subjective  Pt reports that he incorporated repeated extensions into his exercise yesterday, and that he feels better today.  He reports a 1 or 2 out of 10 pain.  No specific questions or concerns upon arrival today.    Pertinent History  Pt states that his LBP started a  few years ago, but doesn't recall any specific MOI.  He states that Dr. Ernest Pine suggested taking tyenol PM, which has given him some relief.  He states that it is worse when getting out of bed in AM, as well as with tying his shoes.  He states that he fatigues easily with trunk flexion and that it brings on pain.  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  He denies any numbness and tingling, but does report some referred pain into bilateral posterior thigh that stays above his knee.    Limitations  Sitting;Walking;Standing;Lifting    How long can you sit comfortably?  5-15 min in a car; up to 30-45 min in a supportive chair    How long can you stand comfortably?  pain begins almost immediately    How long can you walk comfortably?  more limited due to LE's, not LBP    Diagnostic tests  x-rays    Patient Stated Goals  to decrease back pain    Currently in Pain?  Yes    Pain Score  2     Pain Location  Back    Pain Orientation  Lower    Pain Descriptors / Indicators  Sore    Pain Type  Chronic pain    Pain Onset  More than a month ago    Pain Frequency  Intermittent       TREATMENT      Therapeutic Exercise      Octane L4 x 5 minutes for warm-up during history with moist heat pack on low back;     hooklying bridging x10  hooklying resisted hip abduction x10  hooklying resisted hip adduction x10  Repeated extensions x10 in standing with 5 sec hold   Bent knee fall outs with 5s hold x 3 in each direction with overpressure    Bilateral hamstring stretch  3x30sec     Manual Therapy       CPA joint mobs T10-L5  grade II x 30s 3 bouts/level;       UPA joint mobs T10-L5  grade II x 20s 3 bouts/level bilateral;       sidelying lumbar rotational joint mobs L1-L5 grade II-III x20s 3 bouts/level performed bilaterally  STM of the lumbar paraspinals and lumbar fascia          Pt educated throughout  session about proper posture and technique with exercises. Improved exercise technique, movement at target joints, use of target muscles after min to mod verbal, visual, tactile cues.                   Pt demonstrated excellent motivation throughout today's session.  He reports relief with repeated extensions, but states that then he feels stiff with trunk flexion.  Pt education was provided to explain that repeated extensions were beneficial for him to gain additional lumbar mobility.  Decreased mobility noted throughout with joint UPA and CPA joint mobs, with notably limited motion between T10-L1 today.  He continues to respond well to manual therapy, and tolerated the addition of bridging, hip abduction, and hip adduction exercises today.  Pt will continue to benefit from skilled PT services in order to decrease his pain and to increase his spinal mobility in order to improve to full function at home and to improve his QOL.          PT Short Term Goals - 07/14/19 1310      PT SHORT TERM GOAL #1   Title  Pt will be independent with HEP in order to improve strength and decrease back pain in order to improve pain-free function at home.    Time  6    Period  Weeks    Status  New    Target Date  08/25/19        PT Long Term Goals - 07/16/19 1408      PT LONG TERM GOAL #1   Title  Pt will decrease worst back pain as reported on NPRS by at least 2 points in order to demonstrate clinically significant reduction in back pain.    Baseline  07/14/19: worst = 4/10    Time  12    Period  Weeks    Status  New    Target Date  10/06/19      PT LONG TERM GOAL #2   Title  Pt will increase strength of by at least 1/2 MMT grade in order to demonstrate improvement in strength and function.    Baseline  07/14/19: B hip flexion 4/5, R hip ER 4+/5, B hip abduction 4+, B hip adduction 4/5, B hip extension 4/5    Time  12    Period  Weeks    Status  New    Target Date  10/06/19      PT LONG TERM  GOAL #3   Title  Pt will be able to sit for at least 30 minutes while riding in a car withou tincrease in his back pain in order to improve tolerance for travel to appointments and personal events.    Baseline  07/14/19: 5-15 minutes before back pain increases    Time  12    Period  Weeks    Status  New    Target Date  10/06/19      PT LONG TERM GOAL #4   Title  Pt will decrease mODI scoreby at least 13 points in order demonstrate clinically significant reduction in back pain/disability.    Baseline  07/14/19: Pt to complete at next visit; 07/16/19: 24/50=48%    Time  12    Period  Weeks    Status  New    Target Date  10/06/19            Plan - 08/20/19 1237    Clinical Impression Statement  Pt demonstrated excellent motivation throughout today's session.  He reports relief with repeated extensions, but states that then he feels stiff with trunk flexion.  Pt education was provided to explain that repeated extensions were beneficial for him to gain additional lumbar mobility.  Decreased mobility noted throughout with joint UPA and CPA joint mobs, with notably limited motion between T10-L1 today.  He continues to respond well to manual therapy, and tolerated the addition of bridging, hip abduction, and hip adduction exercises today.  Pt will continue to benefit from skilled PT services in order to decrease his pain and to increase his spinal mobility in order to improve to full function at home and to improve his QOL.    Personal Factors and Comorbidities  Age;Behavior Pattern;Comorbidity 3+;Time since onset of injury/illness/exacerbation    Comorbidities  hx of stroke, CAD, HLD, HTN, age, chronicity of onset, hx of hip surgery    Examination-Activity Limitations  Sit;Sleep;Lift;Bend;Squat;Stand    Examination-Participation Restrictions  Community Activity;Yard Work    Conservation officer, historic buildingstability/Clinical Decision Making  Evolving/Moderate complexity    Rehab Potential  Fair    PT Frequency  2x / week    PT  Duration  12 weeks    PT Treatment/Interventions  Canalith Repostioning;Cryotherapy;Electrical Stimulation;Iontophoresis 4mg /ml Dexamethasone;Moist Heat;Traction;Ultrasound;Functional mobility training;Therapeutic activities;Therapeutic exercise;Balance training;Neuromuscular re-education;Patient/family education;Manual techniques;Dry needling;Spinal Manipulations;Joint Manipulations;Aquatic Therapy;Passive range of motion;Vestibular;Vasopneumatic Device    PT Next Visit Plan  CPAs and UPA's of lumbar spine; manual therapy; therex    PT Home Exercise Plan  Access Code: [email or web address] (Hooklying lumbar rotation, ant/post pelvic tilt),  lying in prone intermittently throughout the day for 3-5 minutes at a time    Consulted and Agree with Plan of Care  Patient       Patient will benefit from skilled therapeutic intervention in order to improve the following deficits and impairments:  Difficulty walking, Hypomobility, Increased muscle spasms, Pain, Impaired flexibility, Decreased strength, Decreased activity tolerance, Abnormal gait  Visit Diagnosis: Chronic bilateral low back pain without sciatica  Muscle weakness (generalized)     Problem List Patient Active Problem List   Diagnosis Date Noted  . Stenosis of right carotid artery 02/01/2017  . Acute embolic stroke ([facility]) 02/01/2017  . Bradycardia 11/10/2015  . Essential tremor 06/03/2015  . Dizziness 08/21/2012  . Syncope 04/10/2011  . CALF PAIN, LEFT 10/11/2010  . DYSPEPSIA  07/31/2010  . CHEST DISCOMFORT 07/31/2010  . Hyperlipidemia 05/16/2010  . HYPERTENSION, BENIGN 05/16/2010  . CAD, NATIVE VESSEL 05/16/2010    This entire session was performed under direct supervision and direction of a licensed therapist/therapist assistant . I have personally read, edited and approve of the note as written.   Lutricia Horsfall, SPT Phillips Grout PT, DPT, GCS  Huprich,Jason 08/21/2019, [place] MAIN  [facility] SERVICES [address] [place], [place], [address] Phone: [phone]   Fax:  [phone]  Name: Calvin Rafter [facility]. MRN: [id number] Date of Birth: 1933-11-07

## 2019-08-25 ENCOUNTER — Other Ambulatory Visit: Payer: Self-pay

## 2019-08-25 ENCOUNTER — Ambulatory Visit: Payer: Medicare Other

## 2019-08-25 DIAGNOSIS — M6281 Muscle weakness (generalized): Secondary | ICD-10-CM

## 2019-08-25 DIAGNOSIS — M545 Low back pain, unspecified: Secondary | ICD-10-CM

## 2019-08-25 DIAGNOSIS — G8929 Other chronic pain: Secondary | ICD-10-CM

## 2019-08-25 NOTE — Therapy (Signed)
[place] MAIN [facility] SERVICES [address] [place], [place], [address] Phone: [phone]   Fax:  [phone]  Physical Therapy Treatment  Patient Details  Name: [name]. MRN: [id number] Date of Birth: 07-19-1933 Referring Provider (PT): [name]   Encounter Date: 08/25/2019  PT End of Session - 08/25/19 0942    Visit Number  9    Number of Visits  25    Date for PT Re-Evaluation  10/06/19    Authorization Type  Eval 8/11/0    PT Start Time  0930    PT Stop Time  1015    PT Time Calculation (min)  45 min    Activity Tolerance  Patient tolerated treatment well    Behavior During Therapy  [facility] for tasks assessed/performed       Past Medical History:  Diagnosis Date  . Acute embolic stroke ([place]) 01/11/5283  . BPH (benign prostatic hyperplasia)   . Bradycardia 11/10/2015  . Coronary artery disease   . Dizziness 08/21/2012  . DYSPEPSIA 07/31/2010   Qualifier: Diagnosis of  By: [name]    . Hyperlipidemia 05/16/2010   Qualifier: Diagnosis of  By: [name], [name]     . Hypertension   . HYPERTENSION, BENIGN 05/16/2010   Qualifier: Diagnosis of  By: [name], [name]    . Stenosis of right carotid artery 02/01/2017    Past Surgical History:  Procedure Laterality Date  . HIP SURGERY     right hip   . Intestinal Blockage    . LOOP RECORDER INSERTION N/A 05/14/2017   Procedure: Loop Recorder Insertion;  Surgeon: [name], MD;  Location: [facility];  Service: Cardiovascular;  Laterality: N/A;  . SKIN CANCER EXCISION     x2  . TONSILLECTOMY      There were no vitals filed for this visit.  Subjective Assessment - 08/25/19 0937    Subjective  Pt states that his back is feeling between 50-60% better compared to starting therapy.  He states that his back still feels stiff in the morning, but that it loosens up once moving.  No specific questions or concerns at this time.    Pertinent History  Pt states  that his LBP started a few years ago, but doesn't recall any specific MOI.  He states that Dr. Marry Guan suggested taking tyenol PM, which has given him some relief.  He states that it is worse when getting out of bed in AM, as well as with tying his shoes.  He states that he fatigues easily with trunk flexion and that it brings on pain.  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  He denies any numbness and tingling, but does report some referred pain into bilateral posterior thigh that stays above his knee.    Limitations  Sitting;Walking;Standing;Lifting    How long can you sit comfortably?  5-15 min in a car; up to 30-45 min in a supportive chair    How long can you stand comfortably?  pain begins almost immediately    How long can you walk comfortably?  more limited due to LE's, not LBP    Diagnostic tests  x-rays    Patient Stated Goals  to decrease back pain    Currently in Pain?  Yes    Pain Score  2     Pain Location  Back  Pain Orientation  Lower    Pain Descriptors / Indicators  Sore    Pain Type  Chronic pain    Pain Onset  More than a month ago    Pain Frequency  Intermittent        TREATMENT       Therapeutic Exercise        Octane L4 x 5 minutes for warm-up during history with moist heat pack on low back;       hooklying bridging x10    Repeated extensions x10 in standing with 5 sec hold     Bilateral supine glut stretch 2x30s  Bilateral supine  hamstring stretch  3x30sec   Bilateral seated hamstring stretch 3x30s        Manual Therapy         CPA joint mobs T10-L5  grade II x 30s 3 bouts/level;         UPA joint mobs T10-L5  grade II x 20s 3 bouts/level bilateral;         STM of the lumbar paraspinals and lumbar fascia             Pt educated throughout session about proper posture and technique with exercises. Improved exercise technique, movement at target joints, use of target muscles  after min to mod verbal, visual, tactile cues.                    Pt demonstrated excellent motivation throughout today's session.  He reports that he feels 50-60% better from the initiation of PT services.  He demonstrates significantly decreased hamstring length, and was provided a new HEP to include a seated hamstring stretch.  He continues to present with decreased CPA and UPA mobility at T10-L1.  He continues to respond well to manual therapy, joint mobilizations and STM.  Pt will continue to benefit from skilled PT services in order to decrease his pain and to increase his spinal mobility in order to improve to full function at home and to improve his QOL.         PT Short Term Goals - 07/14/19 1310      PT SHORT TERM GOAL #1   Title  Pt will be independent with HEP in order to improve strength and decrease back pain in order to improve pain-free function at home.    Time  6    Period  Weeks    Status  New    Target Date  08/25/19        PT Long Term Goals - 07/16/19 1408      PT LONG TERM GOAL #1   Title  Pt will decrease worst back pain as reported on NPRS by at least 2 points in order to demonstrate clinically significant reduction in back pain.    Baseline  07/14/19: worst = 4/10    Time  12    Period  Weeks    Status  New    Target Date  10/06/19      PT LONG TERM GOAL #2   Title  Pt will increase strength of by at least 1/2 MMT grade in order to demonstrate improvement in strength and function.    Baseline  07/14/19: B hip flexion 4/5, R hip ER 4+/5, B hip abduction 4+, B hip adduction 4/5, B hip extension 4/5    Time  12    Period  Weeks    Status  New    Target Date  10/06/19  PT LONG TERM GOAL #3   Title  Pt will be able to sit for at least 30 minutes while riding in a car withou tincrease in his back pain in order to improve tolerance for travel to appointments and personal events.    Baseline  07/14/19: 5-15 minutes before back pain increases    Time  12     Period  Weeks    Status  New    Target Date  10/06/19      PT LONG TERM GOAL #4   Title  Pt will decrease mODI scoreby at least 13 points in order demonstrate clinically significant reduction in back pain/disability.    Baseline  07/14/19: Pt to complete at next visit; 07/16/19: 24/50=48%    Time  12    Period  Weeks    Status  New    Target Date  10/06/19            Plan - 08/25/19 1259    Clinical Impression Statement  Pt demonstrated excellent motivation throughout today's session.  He reports that he feels 50-60% better from the initiation of PT services.  He demonstrates significantly decreased hamstring length, and was provided a new HEP to include a seated hamstring stretch.  He continues to present with decreased CPA and UPA mobility at T10-L1.  He continues to respond well to manual therapy, joint mobilizations and STM.  Pt will continue to benefit from skilled PT services in order to decrease his pain and to increase his spinal mobility in order to improve to full function at home and to improve his QOL.    Personal Factors and Comorbidities  Age;Behavior Pattern;Comorbidity 3+;Time since onset of injury/illness/exacerbation    Comorbidities  hx of stroke, CAD, HLD, HTN, age, chronicity of onset, hx of hip surgery    Examination-Activity Limitations  Sit;Sleep;Lift;Bend;Squat;Stand    Examination-Participation Restrictions  Community Activity;Yard Work    Conservation officer, historic buildings  Evolving/Moderate complexity    Rehab Potential  Fair    PT Frequency  2x / week    PT Duration  12 weeks    PT Treatment/Interventions  Canalith Repostioning;Cryotherapy;Electrical Stimulation;Iontophoresis 4mg /ml Dexamethasone;Moist Heat;Traction;Ultrasound;Functional mobility training;Therapeutic activities;Therapeutic exercise;Balance training;Neuromuscular re-education;Patient/family education;Manual techniques;Dry needling;Spinal Manipulations;Joint Manipulations;Aquatic  Therapy;Passive range of motion;Vestibular;Vasopneumatic Device    PT Next Visit Plan  CPAs and UPA's of lumbar spine; manual therapy; therex    PT Home Exercise Plan  Access Code: [email or web address] (Hooklying lumbar rotation, ant/post pelvic tilt, seated hamstring stretch, standing side bending stretch),  lying in prone intermittently throughout the day for 3-5 minutes at a time    Consulted and Agree with Plan of Care  Patient       Patient will benefit from skilled therapeutic intervention in order to improve the following deficits and impairments:  Difficulty walking, Hypomobility, Increased muscle spasms, Pain, Impaired flexibility, Decreased strength, Decreased activity tolerance, Abnormal gait  Visit Diagnosis: Chronic bilateral low back pain without sciatica  Muscle weakness (generalized)     Problem List Patient Active Problem List   Diagnosis Date Noted  . Stenosis of right carotid artery 02/01/2017  . Acute embolic stroke ([facility]) 02/01/2017  . Bradycardia 11/10/2015  . Essential tremor 06/03/2015  . Dizziness 08/21/2012  . Syncope 04/10/2011  . CALF PAIN, LEFT 10/11/2010  . DYSPEPSIA 07/31/2010  . CHEST DISCOMFORT 07/31/2010  . Hyperlipidemia 05/16/2010  . HYPERTENSION, BENIGN 05/16/2010  . CAD, NATIVE VESSEL 05/16/2010    This entire session was performed under direct supervision and direction of a  licensed Estate agenttherapist/therapist assistant . I have personally read, edited and approve of the note as written.   [name], SPT [name] PT, DPT, GCS  Pollard,[name] 08/25/2019, 2:22 PM  [place] [facility] [address] [place], [place], [address] Phone: [phone]   Fax:  [phone]  Name: Calvin ShortsGeorge Stanford [facility] LLCBason Jr. MRN: [id number] Date of Birth: 04/30/1933

## 2019-08-25 NOTE — Patient Instructions (Signed)
Access Code: [email or web address]  URL: [email or web address]  Date: 08/25/2019  Prepared by: Roxana Hires   Exercises Supine Lower Trunk Rotation - 10 reps - 2 sets - 3s hold - 2x daily - 7x weekly Supine Pelvic Tilt - 10 reps - 2 sets - 3s hold - 2x daily - 7x weekly Seated Table Hamstring Stretch - 5 reps - 3 sets - 30 hold - 2x daily - 7x weekly Standing Sidebends - 5 reps - 3 sets - 3 hold - 2x daily - 7x weekly

## 2019-08-27 ENCOUNTER — Other Ambulatory Visit: Payer: Self-pay

## 2019-08-27 ENCOUNTER — Ambulatory Visit: Payer: Medicare Other

## 2019-08-27 ENCOUNTER — Ambulatory Visit (INDEPENDENT_AMBULATORY_CARE_PROVIDER_SITE_OTHER): Payer: Medicare Other | Admitting: *Deleted

## 2019-08-27 DIAGNOSIS — G8929 Other chronic pain: Secondary | ICD-10-CM

## 2019-08-27 DIAGNOSIS — M545 Low back pain, unspecified: Secondary | ICD-10-CM

## 2019-08-27 DIAGNOSIS — I639 Cerebral infarction, unspecified: Secondary | ICD-10-CM | POA: Diagnosis not present

## 2019-08-27 DIAGNOSIS — M6281 Muscle weakness (generalized): Secondary | ICD-10-CM

## 2019-08-27 LAB — CUP PACEART REMOTE DEVICE CHECK
Date Time Interrogation Session: 20200924160113
Implantable Pulse Generator Implant Date: 20180612

## 2019-08-27 NOTE — Therapy (Signed)
[place] MAIN [facility] SERVICES [address] [place], [place], [address] Phone: [phone]   Fax:  [phone]  Physical Therapy Progress Note Dates of reporting period  07/14/19  to   08/27/19   Patient Details  Name: [name] [facility]. MRN: [id number] Date of Birth: [birth year] Referring Provider (PT): [name]   Encounter Date: 08/27/2019  PT End of Session - 08/27/19 0941    Visit Number  10    Number of Visits  25    Date for PT Re-Evaluation  10/06/19    Authorization Type  Eval 8/11/0, goals updeated 08/27/19    PT Start Time  0932    PT Stop Time  1015    PT Time Calculation (min)  43 min    Activity Tolerance  Patient tolerated treatment well    Behavior During Therapy  [facility] for tasks assessed/performed       Past Medical History:  Diagnosis Date  . Acute embolic stroke ([place]) 06/02/6966  . BPH (benign prostatic hyperplasia)   . Bradycardia 11/10/2015  . Coronary artery disease   . Dizziness 08/21/2012  . DYSPEPSIA 07/31/2010   Qualifier: Diagnosis of  By: [name]    . Hyperlipidemia 05/16/2010   Qualifier: Diagnosis of  By: [name], [name]     . Hypertension   . HYPERTENSION, BENIGN 05/16/2010   Qualifier: Diagnosis of  By: [name], [name]    . Stenosis of right carotid artery 02/01/2017    Past Surgical History:  Procedure Laterality Date  . HIP SURGERY     right hip   . Intestinal Blockage    . LOOP RECORDER INSERTION N/A 05/14/2017   Procedure: Loop Recorder Insertion;  Surgeon: [name], MD;  Location: [facility];  Service: Cardiovascular;  Laterality: N/A;  . SKIN CANCER EXCISION     x2  . TONSILLECTOMY      There were no vitals filed for this visit.  Subjective Assessment - 08/27/19 0942    Subjective  Pt states that his back is feeling between 50-60% better compared to starting therapy.  He reports a 2/10 pain while sitting and filling out his mODI.  No specific questions or  concerns at this time.    Pertinent History  Pt states that his LBP started a few years ago, but doesn't recall any specific MOI.  He states that Dr. Marry Guan suggested taking tyenol PM, which has given him some relief.  He states that it is worse when getting out of bed in AM, as well as with tying his shoes.  He states that he fatigues easily with trunk flexion and that it brings on pain.  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  He denies any numbness and tingling, but does report some referred pain into bilateral posterior thigh that stays above his knee.    Limitations  Sitting;Walking;Standing;Lifting    How long can you sit comfortably?  5-15 min in a car; up to 30-45 min in a supportive chair    How long can you stand comfortably?  pain begins almost immediately    How long can you walk comfortably?  more limited due to LE's, not LBP    Diagnostic tests  x-rays    Patient Stated Goals  to decrease back pain    Currently in Pain?  Yes    Pain Score  2     Pain Location  Back    Pain Orientation  Lower    Pain Descriptors / Indicators  Sore    Pain Type  Chronic pain    Pain Onset  More than a month ago    Pain Frequency  Intermittent         TREATMENT         Therapeutic Exercise        Reassessed Goals: mODI, NPRS, MMT     B hip flexion 5/5, B hip ER 4+/5, B hip abduction 4+, B hip adduction 5/5, B hip extension 5/5  Repeated extensions x10 in standing with 5 sec hold       Bilateral supine hamstring stretch  3x30sec     Bilateral side bending stretch 2x 30s     Manual Therapy           CPA joint mobs T10-L5  grade II x 30s 3 bouts/level;      UPA joint mobs T10-L5  grade II x 20s 3 bouts/level bilateral;             Trigger Point Dry Needling (TDN), unbilled Education performed with patient regarding potential benefit of TDN. Reviewed precautions and risks with patient. Extensive time spent with  pt to ensure full understanding of TDN risks. Pt provided verbal consent to treatment. TDN performed to lumbar paraspinals bilaterally at L3 and L5 with 4, 0.30 x 75 single needle placements with local twitch response (LTR). Pistoning technique utilized. Improved pain-free motion following intervention.        Pt educated throughout session about proper posture and technique with exercises. Improved exercise technique, movement at target joints, use of target muscles after min to mod verbal, visual, tactile cues.                        Pt demonstrated excellent motivation throughout today's session.  He has met 1 STG and 1 LTG to date and has made progress towards 3 additional LTGs.  He reports that he feels 50-60% better from the initiation of PT services.  He has improved his LE strength to 4+/5 on bilateral hip abduction and R hip ER, and 5/5 on bilateral hip flexion, bilateral hip extension, bilateral hip adduction, and L hip ER.  His mODI has improved from a 48% to a 42% today.  His worst endorsed pain on the NPRS has decreased from a 4/10 to a 3/10.  Additionally, he is able to ride in a car for 30 min to an hour with lumbar support, however he can only ride for a few minutes before an increase in pain without lumbar support.  Pt will continue to benefit from skilled PT services in order to decrease his pain and to increase his spinal mobility in order to improve to full function at home and to improve his QOL.           PT Short Term Goals - 08/27/19 1235      PT SHORT TERM GOAL #1   Title  Pt will be independent with HEP in order to improve strength and decrease back pain in order to improve pain-free function at home.    Time  6    Period  Weeks    Status  Partially Met    Target Date  08/25/19        PT Long Term Goals - 08/27/19 0938      PT LONG TERM  GOAL #1   Title  Pt will decrease worst back pain as reported on NPRS by at least 2 points in order to demonstrate  clinically significant reduction in back pain.    Baseline  07/14/19: worst = 4/10; 08/27/19: worst = 3/10    Time  12    Period  Weeks    Status  Partially Met    Target Date  10/06/19      PT LONG TERM GOAL #2   Title  Pt will increase strength of by at least 1/2 MMT grade in order to demonstrate improvement in strength and function.    Baseline  07/14/19: B hip flexion 4/5, B hip ER 4+/5, B hip abduction 4+, B hip adduction 4/5, B hip extension 4/5; 08/27/19: B hip flexion 5/5, B hip ER 4+/5, B hip abduction 4+/4+, B hip adduction 5/5, B hip extension 5/5    Time  12    Period  Weeks    Status  Achieved      PT LONG TERM GOAL #3   Title  Pt will be able to sit for at least 30 minutes while riding in a car withou tincrease in his back pain in order to improve tolerance for travel to appointments and personal events.    Baseline  07/14/19: 5-15 minutes before back pain increases; 08/27/19: without back support pain increases within minutes, but with back support he can ride for 30-min to an hour without pain.    Time  12    Period  Weeks    Status  Partially Met    Target Date  10/06/19      PT LONG TERM GOAL #4   Title  Pt will decrease mODI scoreby at least 13 points in order demonstrate clinically significant reduction in back pain/disability.    Baseline  07/14/19: Pt to complete at next visit; 07/16/19: 24/50=48%; 08/27/19: 21/50 = 42%    Time  12    Period  Weeks    Status  Partially Met    Target Date  10/06/19            Plan - 08/27/19 1251    Clinical Impression Statement  Pt demonstrated excellent motivation throughout today's session.  He has met 1 STG and 1 LTG to date and has made progress towards 3 additional LTGs.  He reports that he feels 50-60% better from the initiation of PT services.  He has improved his LE strength to 4+/5 on bilateral hip abduction and R hip ER, and 5/5 on bilateral hip flexion, bilateral hip extension, bilateral hip adduction, and L hip ER.  His  mODI has improved from a 48% to a 42% today.  His worst endorsed pain on the NPRS has decreased from a 4/10 to a 3/10.  Additionally, he is able to ride in a car for 30 min to an hour with lumbar support, however he can only ride for a few minutes before an increase in pain without lumbar support.  Pt will continue to benefit from skilled PT services in order to decrease his pain and to increase his spinal mobility in order to improve to full function at home and to improve his QOL.    Personal Factors and Comorbidities  Age;Behavior Pattern;Comorbidity 3+;Time since onset of injury/illness/exacerbation    Comorbidities  hx of stroke, CAD, HLD, HTN, age, chronicity of onset, hx of hip surgery    Examination-Activity Limitations  Sit;Sleep;Lift;Bend;Squat;Stand    Examination-Participation Restrictions  Community Activity;Valla Leaver Work  Stability/Clinical Decision Making  Evolving/Moderate complexity    Rehab Potential  Fair    PT Frequency  2x / week    PT Duration  12 weeks    PT Treatment/Interventions  Canalith Repostioning;Cryotherapy;Electrical Stimulation;Iontophoresis 77m/ml Dexamethasone;Moist Heat;Traction;Ultrasound;Functional mobility training;Therapeutic activities;Therapeutic exercise;Balance training;Neuromuscular re-education;Patient/family education;Manual techniques;Dry needling;Spinal Manipulations;Joint Manipulations;Aquatic Therapy;Passive range of motion;Vestibular;Vasopneumatic Device    PT Next Visit Plan  CPAs and UPA's of lumbar spine; manual therapy; therex    PT Home Exercise Plan  Access Code: [email or web address](Hooklying lumbar rotation, ant/post pelvic tilt, seated hamstring stretch, standing side bending stretch),  lying in prone intermittently throughout the day for 3-5 minutes at a time    Consulted and Agree with Plan of Care  Patient       Patient will benefit from skilled therapeutic intervention in order to improve the following deficits and impairments:  Difficulty walking,  Hypomobility, Increased muscle spasms, Pain, Impaired flexibility, Decreased strength, Decreased activity tolerance, Abnormal gait  Visit Diagnosis: Chronic bilateral low back pain without sciatica  Muscle weakness (generalized)     Problem List Patient Active Problem List   Diagnosis Date Noted  . Stenosis of right carotid artery 02/01/2017  . [place] 067/70/3403 . Bradycardia 11/10/2015  . Essential tremor 06/03/2015  . Dizziness 08/21/2012  . Syncope 04/10/2011  . CALF PAIN, LEFT 10/11/2010  . DYSPEPSIA 07/31/2010  . CHEST DISCOMFORT 07/31/2010  . Hyperlipidemia 05/16/2010  . HYPERTENSION, BENIGN 05/16/2010  . CAD, NATIVE VESSEL 05/16/2010    This entire session was performed under direct supervision and direction of a licensed therapist/therapist assistant . I have personally read, edited and approve of the note as written.   TLutricia Horsfall SPT JPhillips GroutPT, DPT, GCS  Huprich,Jason 08/27/2019, 5:16 PM  [place] [facility] [address] [place] [address]Phone: [phone]  Fax:  [phone] Name: Calvin CostillaBTufts Medical Center MRN: [id number] of Birth: 109-06-1933

## 2019-09-01 ENCOUNTER — Other Ambulatory Visit: Payer: Self-pay

## 2019-09-01 ENCOUNTER — Ambulatory Visit: Payer: Medicare Other

## 2019-09-01 DIAGNOSIS — M6281 Muscle weakness (generalized): Secondary | ICD-10-CM

## 2019-09-01 DIAGNOSIS — M545 Low back pain, unspecified: Secondary | ICD-10-CM

## 2019-09-01 DIAGNOSIS — G8929 Other chronic pain: Secondary | ICD-10-CM

## 2019-09-01 DIAGNOSIS — R2681 Unsteadiness on feet: Secondary | ICD-10-CM

## 2019-09-01 NOTE — Therapy (Signed)
[place] MAIN [facility] SERVICES [address] [place], [place], [address] Phone: [phone]   Fax:  [phone]  Physical Therapy Treatment  Patient Details  Name: [name]. MRN: [id number] Date of Birth: 04-05-1933 Referring Provider (PT): [name]   Encounter Date: 09/01/2019  PT End of Session - 09/01/19 1006    Visit Number  11    Number of Visits  25    Date for PT Re-Evaluation  10/06/19    Authorization Type  Eval 8/11/0, goals updated 08/27/19    PT Start Time  0930    PT Stop Time  1015    PT Time Calculation (min)  45 min    Activity Tolerance  Patient tolerated treatment well    Behavior During Therapy  [facility] for tasks assessed/performed       Past Medical History:  Diagnosis Date  . Acute embolic stroke ([place]) 02/08/4664  . BPH (benign prostatic hyperplasia)   . Bradycardia 11/10/2015  . Coronary artery disease   . Dizziness 08/21/2012  . DYSPEPSIA 07/31/2010   Qualifier: Diagnosis of  By: [name]    . Hyperlipidemia 05/16/2010   Qualifier: Diagnosis of  By: [name], [name]     . Hypertension   . HYPERTENSION, BENIGN 05/16/2010   Qualifier: Diagnosis of  By: [name], [name]    . Stenosis of right carotid artery 02/01/2017    Past Surgical History:  Procedure Laterality Date  . HIP SURGERY     right hip   . Intestinal Blockage    . LOOP RECORDER INSERTION N/A 05/14/2017   Procedure: Loop Recorder Insertion;  Surgeon: [name], MD;  Location: [facility];  Service: Cardiovascular;  Laterality: N/A;  . SKIN CANCER EXCISION     x2  . TONSILLECTOMY      There were no vitals filed for this visit.  Subjective Assessment - 09/01/19 0936    Subjective  Pt states that he feels some soreness today, but that he felt good after dry needling last session.  He reports good compliance with his HEP.  No specific questions or concerns at this time.    Pertinent History  Pt states that his LBP  started a few years ago, but doesn't recall any specific MOI.  He states that Dr. Marry Guan suggested taking tyenol PM, which has given him some relief.  He states that it is worse when getting out of bed in AM, as well as with tying his shoes.  He states that he fatigues easily with trunk flexion and that it brings on pain.  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  He denies any numbness and tingling, but does report some referred pain into bilateral posterior thigh that stays above his knee.    Limitations  Sitting;Walking;Standing;Lifting    How long can you sit comfortably?  5-15 min in a car; up to 30-45 min in a supportive chair    How long can you stand comfortably?  pain begins almost immediately    How long can you walk comfortably?  more limited due to LE's, not LBP    Diagnostic tests  x-rays    Patient Stated Goals  to decrease back pain    Currently in Pain?  Yes    Pain Score  2     Pain Location  Back    Pain Orientation  Lower    Pain Descriptors / Indicators  Sore    Pain Type  Chronic pain    Pain Onset  More than a month ago    Pain Frequency  Intermittent           TREATMENT         Therapeutic Exercise         Octane L4 x 5 minutes for warm-up during history with moist heat pack on low back;         hooklying bridging 2x10      Posterior pelvic tilts 2x10  Prone hip extensions with straight leg 2x10  Prone hip extensions with bent knee 2x10  Repeated extensions x10 in standing with 5 sec hold       Bilateral supine glut stretch 2x30s ; feels a pinch in the R hip  Bilateral supine  hamstring stretch  3x30sec              Manual Therapy          CPA joint mobs T10-L5  grade II x 30s 3 bouts/level;           UPA joint mobs T10-L5  grade II x 20s 3 bouts/level bilateral;           STM of the lumbar paraspinals and lumbar fascia         Trigger Point Dry Needling (TDN),  unbilled Education performed with patient regarding potential benefit of TDN. Reviewed precautions and risks with patient. Extensive time spent with pt to ensure full understanding of TDN risks. Pt provided verbal consent to treatment. TDN performed tolumbar paraspinals bilaterallyat L3 and L5with 4, 0.30x 75single needle placements with local twitch response (LTR). Pistoning technique utilized. Improved pain-free motion following intervention.              Pt educated throughout session about proper posture and technique with exercises. Improved exercise technique, movement at target joints, use of target muscles after min to mod verbal, visual, tactile cues.                          Pt demonstrated excellent motivation throughout today's session.  He remains compliant with his HEP, demonstrating good technique.  He continues to demonstrate good technique with all therapeutic exercises during his PT sessions.  He continues to respond well to manual therapy, joint mobilizations, dry needling, and STM.  Pt will continue to benefit from skilled PT services in order to decrease his pain and to increase his spinal mobility in order to improve to full function at home and to improve his QOL.              PT Short Term Goals - 08/27/19 1235      PT SHORT TERM GOAL #1   Title  Pt will be independent with HEP in order to improve strength and decrease back pain in order to improve pain-free function at home.    Time  6    Period  Weeks    Status  Partially Met    Target Date  08/25/19        PT Long Term Goals - 08/27/19 3428      PT LONG TERM GOAL #1   Title  Pt will decrease worst back pain as reported on NPRS by at least 2 points in order to demonstrate clinically significant reduction in back pain.    Baseline  07/14/19: worst = 4/10; 08/27/19:  worst = 3/10    Time  12    Period  Weeks    Status  Partially Met    Target Date  10/06/19      PT LONG TERM GOAL #2   Title  Pt  will increase strength of by at least 1/2 MMT grade in order to demonstrate improvement in strength and function.    Baseline  07/14/19: B hip flexion 4/5, B hip ER 4+/5, B hip abduction 4+, B hip adduction 4/5, B hip extension 4/5; 08/27/19: B hip flexion 5/5, B hip ER 4+/5, B hip abduction 4+/4+, B hip adduction 5/5, B hip extension 5/5    Time  12    Period  Weeks    Status  Achieved      PT LONG TERM GOAL #3   Title  Pt will be able to sit for at least 30 minutes while riding in a car withou tincrease in his back pain in order to improve tolerance for travel to appointments and personal events.    Baseline  07/14/19: 5-15 minutes before back pain increases; 08/27/19: without back support pain increases within minutes, but with back support he can ride for 30-min to an hour without pain.    Time  12    Period  Weeks    Status  Partially Met    Target Date  10/06/19      PT LONG TERM GOAL #4   Title  Pt will decrease mODI scoreby at least 13 points in order demonstrate clinically significant reduction in back pain/disability.    Baseline  07/14/19: Pt to complete at next visit; 07/16/19: 24/50=48%; 08/27/19: 21/50 = 42%    Time  12    Period  Weeks    Status  Partially Met    Target Date  10/06/19            Plan - 09/01/19 1232    Clinical Impression Statement  Pt demonstrated excellent motivation throughout today's session.  He remains compliant with his HEP, demonstrating good technique.  He continues to demonstrate good technique with all therapeutic exercises during his PT sessions.  He continues to respond well to manual therapy, joint mobilizations, dry needling, and STM.  Pt will continue to benefit from skilled PT services in order to decrease his pain and to increase his spinal mobility in order to improve to full function at home and to improve his QOL.    Personal Factors and Comorbidities  Age;Behavior Pattern;Comorbidity 3+;Time since onset of injury/illness/exacerbation     Comorbidities  hx of stroke, CAD, HLD, HTN, age, chronicity of onset, hx of hip surgery    Examination-Activity Limitations  Sit;Sleep;Lift;Bend;Squat;Stand    Examination-Participation Restrictions  Community Activity;Yard Work    [employer] officer  Evolving/Moderate complexity    Rehab Potential  Fair    PT Frequency  2x / week    PT Duration  12 weeks    PT Treatment/Interventions  Canalith Repostioning;Cryotherapy;Electrical Stimulation;Iontophoresis 98m/ml Dexamethasone;Moist Heat;Traction;Ultrasound;Functional mobility training;Therapeutic activities;Therapeutic exercise;Balance training;Neuromuscular re-education;Patient/family education;Manual techniques;Dry needling;Spinal Manipulations;Joint Manipulations;Aquatic Therapy;Passive range of motion;Vestibular;Vasopneumatic Device    PT Next Visit Plan  CPAs and UPA's of lumbar spine; manual therapy; progress therex as tolerated    PT Home Exercise Plan  Access Code: [email or web address](Hooklying lumbar rotation, ant/post pelvic tilt, seated hamstring stretch, standing side bending stretch),  lying in prone intermittently throughout the day for 3-5 minutes at a time    Consulted and Agree with Plan of Care  Patient  Patient will benefit from skilled therapeutic intervention in order to improve the following deficits and impairments:  Difficulty walking, Hypomobility, Increased muscle spasms, Pain, Impaired flexibility, Decreased strength, Decreased activity tolerance, Abnormal gait  Visit Diagnosis: Chronic bilateral low back pain without sciatica  Muscle weakness (generalized)  Unsteadiness on feet     Problem List Patient Active Problem List   Diagnosis Date Noted  . Stenosis of right carotid artery 02/01/2017  . Acute embolic stroke ([place]) 52/71/2929  . Bradycardia 11/10/2015  . Essential tremor 06/03/2015  . Dizziness 08/21/2012  . Syncope 04/10/2011  . CALF PAIN, LEFT 10/11/2010  . DYSPEPSIA 07/31/2010  .  CHEST DISCOMFORT 07/31/2010  . Hyperlipidemia 05/16/2010  . HYPERTENSION, BENIGN 05/16/2010  . CAD, NATIVE VESSEL 05/16/2010     This entire session was performed under direct supervision and direction of a licensed therapist/therapist assistant . I have personally read, edited and approve of the note as written.   Lutricia Horsfall, SPT Phillips Grout PT, DPT, GCS  Huprich,Jason 09/01/2019, 4:08 PM  [place] MAIN [facility] SERVICES [address] [place], [place], [address] Phone: [phone]   Fax:  [phone]  Name: [name] [facility]. MRN: [id number] Date of Birth: October 26, 1933

## 2019-09-02 NOTE — Progress Notes (Signed)
[facility] Summary Report / Loop Recorder 

## 2019-09-03 ENCOUNTER — Other Ambulatory Visit: Payer: Self-pay

## 2019-09-03 ENCOUNTER — Ambulatory Visit: Payer: Medicare Other | Attending: Orthopedic Surgery

## 2019-09-03 DIAGNOSIS — R2681 Unsteadiness on feet: Secondary | ICD-10-CM | POA: Insufficient documentation

## 2019-09-03 DIAGNOSIS — G8929 Other chronic pain: Secondary | ICD-10-CM | POA: Diagnosis present

## 2019-09-03 DIAGNOSIS — M545 Low back pain, unspecified: Secondary | ICD-10-CM

## 2019-09-03 DIAGNOSIS — M6281 Muscle weakness (generalized): Secondary | ICD-10-CM | POA: Insufficient documentation

## 2019-09-03 NOTE — Therapy (Signed)
[place] MAIN [facility] SERVICES [address] [place], [place], [address] Phone: [phone]   Fax:  [phone]  Physical Therapy Treatment  Patient Details  Name: [name]. MRN: [id number] Date of Birth: October 23, 1933 Referring Provider (PT): [name]   Encounter Date: 09/03/2019  PT End of Session - 09/03/19 1023    Visit Number  12    Number of Visits  25    Date for PT Re-Evaluation  10/06/19    Authorization Type  Eval 8/11/0, goals updated 08/27/19    PT Start Time  0935    PT Stop Time  1015    PT Time Calculation (min)  40 min    Activity Tolerance  Patient tolerated treatment well    Behavior During Therapy  [facility] for tasks assessed/performed       Past Medical History:  Diagnosis Date  . Acute embolic stroke ([place]) 12/09/9148  . BPH (benign prostatic hyperplasia)   . Bradycardia 11/10/2015  . Coronary artery disease   . Dizziness 08/21/2012  . DYSPEPSIA 07/31/2010   Qualifier: Diagnosis of  By: [name]    . Hyperlipidemia 05/16/2010   Qualifier: Diagnosis of  By: [name], [name]     . Hypertension   . HYPERTENSION, BENIGN 05/16/2010   Qualifier: Diagnosis of  By: [name], [name]    . Stenosis of right carotid artery 02/01/2017    Past Surgical History:  Procedure Laterality Date  . HIP SURGERY     right hip   . Intestinal Blockage    . LOOP RECORDER INSERTION N/A 05/14/2017   Procedure: Loop Recorder Insertion;  Surgeon: [name], MD;  Location: [place] [facility];  Service: Cardiovascular;  Laterality: N/A;  . SKIN CANCER EXCISION     x2  . TONSILLECTOMY      There were no vitals filed for this visit.  Subjective Assessment - 09/03/19 1022    Subjective  Pt states that he felt really good yesterday but some of his back soreness has returned today. He currently rates it as a 1-2/10 He reports good compliance with his HEP.  No specific questions or concerns at this time.    Pertinent  History  Pt states that his LBP started a few years ago, but doesn't recall any specific MOI.  He states that Dr. Marry Guan suggested taking tyenol PM, which has given him some relief.  He states that it is worse when getting out of bed in AM, as well as with tying his shoes.  He states that he fatigues easily with trunk flexion and that it brings on pain.  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  He denies any numbness and tingling, but does report some referred pain into bilateral posterior thigh that stays above his knee.    Limitations  Sitting;Walking;Standing;Lifting    How long can you sit comfortably?  5-15 min in a car; up to 30-45 min in a supportive chair    How long can you stand comfortably?  pain begins almost immediately    How long can you walk comfortably?  more limited due to LE's, not LBP    Diagnostic tests  x-rays    Patient Stated Goals  to decrease back pain    Currently in Pain?  Yes    Pain Score  2     Pain Location  Back  Pain Orientation  Lower    Pain Descriptors / Indicators  Sore    Pain Type  Chronic pain    Pain Onset  More than a month ago    Pain Frequency  Intermittent          TREATMENT           Electrical Stimulation  HiVolt estim applied to bilateral lower lumbar paraspinals at pt tolerated intensity (300V on the right, 250V on the L) with moist heat pack on back x 8 minutes;       Manual Therapy  Treadmill for warm-up 1.5 mph during history x 3 minutes (2 minute unbilled);   Prone CPA joint mobs L1-L5  grade III, 30s/bout x 2 bouts/level;          Prone on elbows CPA joint mobs L3-L5  grade III, 30s/bout x 1 bout/level;   Prone PA hip mobilizations at end range extension, grade 3, 30s/bout x 1 bout bilateral;  Supine lumbar rotational mobilization, grade 3 x 30s bilateral; Quad stretch x 30s bilateral;      Trigger Point Dry Needling (TDN), unbilled Education  performed with patient regarding potential benefit of TDN. Reviewed precautions and risks with patient. Extensive time spent with pt to ensure full understanding of TDN risks. Pt provided verbal consent to treatment. TDN performed tolumbar paraspinals bilaterallyatL3 and L5with4,0.30x 75single needle placements with local twitch response (LTR). Pistoning technique utilized. Improved pain-free motion following intervention.     Pt educated throughout session about proper posture and technique with exercises. Improved exercise technique, movement at target joints, use of target muscles after min to mod verbal, visual, tactile cues.                   Pt demonstrated excellent motivation throughout today's session. Continued to utilize dry needling and introduced Hivolt estim today. In addition performed bilateral hip PA mobs to improve hip extension with improved range of motion following. Also able to progress lumbar CPA mobs to prone on elbows to improve extension. He continues to respond well to manual therapy, joint mobilizations, and dry needling.  Pt will continue to benefit from skilled PT services in order to decrease his pain and to increase his spinal mobility in order to improve to full function at home and to improve his QOL.                        PT Short Term Goals - 08/27/19 1235      PT SHORT TERM GOAL #1   Title  Pt will be independent with HEP in order to improve strength and decrease back pain in order to improve pain-free function at home.    Time  6    Period  Weeks    Status  Partially Met    Target Date  08/25/19        PT Long Term Goals - 08/27/19 3664      PT LONG TERM GOAL #1   Title  Pt will decrease worst back pain as reported on NPRS by at least 2 points in order to demonstrate clinically significant reduction in back pain.    Baseline  07/14/19: worst = 4/10; 08/27/19: worst = 3/10    Time  12    Period  Weeks    Status  Partially  Met    Target Date  10/06/19      PT LONG TERM GOAL #2   Title  Pt  will increase strength of by at least 1/2 MMT grade in order to demonstrate improvement in strength and function.    Baseline  07/14/19: B hip flexion 4/5, B hip ER 4+/5, B hip abduction 4+, B hip adduction 4/5, B hip extension 4/5; 08/27/19: B hip flexion 5/5, B hip ER 4+/5, B hip abduction 4+/4+, B hip adduction 5/5, B hip extension 5/5    Time  12    Period  Weeks    Status  Achieved      PT LONG TERM GOAL #3   Title  Pt will be able to sit for at least 30 minutes while riding in a car withou tincrease in his back pain in order to improve tolerance for travel to appointments and personal events.    Baseline  07/14/19: 5-15 minutes before back pain increases; 08/27/19: without back support pain increases within minutes, but with back support he can ride for 30-min to an hour without pain.    Time  12    Period  Weeks    Status  Partially Met    Target Date  10/06/19      PT LONG TERM GOAL #4   Title  Pt will decrease mODI scoreby at least 13 points in order demonstrate clinically significant reduction in back pain/disability.    Baseline  07/14/19: Pt to complete at next visit; 07/16/19: 24/50=48%; 08/27/19: 21/50 = 42%    Time  12    Period  Weeks    Status  Partially Met    Target Date  10/06/19            Plan - 09/03/19 1023    Clinical Impression Statement  Pt demonstrated excellent motivation throughout today's session. Continued to utilize dry needling and introduced Hivolt estim today. In addition performed bilateral hip PA mobs to improve hip extension with improved range of motion following. Also able to progress lumbar CPA mobs to prone on elbows to improve extension. He continues to respond well to manual therapy, joint mobilizations, and dry needling.  Pt will continue to benefit from skilled PT services in order to decrease his pain and to increase his spinal mobility in order to improve to full function at  home and to improve his QOL.    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/Moderate complexity    Rehab Potential  Fair    PT Frequency  2x / week    PT Duration  12 weeks    PT Treatment/Interventions  Canalith Repostioning;Cryotherapy;Electrical Stimulation;Iontophoresis 58m/ml Dexamethasone;Moist Heat;Traction;Ultrasound;Functional mobility training;Therapeutic activities;Therapeutic exercise;Balance training;Neuromuscular re-education;Patient/family education;Manual techniques;Dry needling;Spinal Manipulations;Joint Manipulations;Aquatic Therapy;Passive range of motion;Vestibular;Vasopneumatic Device    PT Next Visit Plan  CPAs and UPA's of lumbar spine; manual therapy; progress therex as tolerated    PT Home Exercise Plan  Access Code: [email or web address](Hooklying lumbar rotation, ant/post pelvic tilt, seated hamstring stretch, standing side bending stretch),  lying in prone intermittently throughout the day for 3-5 minutes at a time    Consulted and Agree with Plan of Care  Patient       Patient will benefit from skilled therapeutic intervention in order to improve the following deficits and impairments:  Difficulty walking, Hypomobility, Increased muscle spasms, Pain, Impaired  flexibility, Decreased strength, Decreased activity tolerance, Abnormal gait  Visit Diagnosis: Chronic bilateral low back pain without sciatica     Problem List Patient Active Problem List   Diagnosis Date Noted  . Stenosis of right carotid artery 02/01/2017  . Acute embolic stroke ([place]) 83/81/8403  . Bradycardia 11/10/2015  . Essential tremor 06/03/2015  .  Dizziness 08/21/2012  . Syncope 04/10/2011  . CALF PAIN, LEFT 10/11/2010  . DYSPEPSIA 07/31/2010  . CHEST DISCOMFORT 07/31/2010  . Hyperlipidemia 05/16/2010  . HYPERTENSION, BENIGN 05/16/2010  . CAD, NATIVE VESSEL 05/16/2010   Phillips Grout PT, DPT, GCS  Huprich,Jason 09/03/2019, 1:35 PM  [place] MAIN [facility] SERVICES [address] [place], [place], [address] Phone: [phone]   Fax:  [phone]  Name: [name] [facility]. MRN: [id number] Date of Birth: 1933/03/06

## 2019-09-07 ENCOUNTER — Ambulatory Visit: Payer: Medicare Other

## 2019-09-07 ENCOUNTER — Telehealth: Payer: Self-pay | Admitting: *Deleted

## 2019-09-07 ENCOUNTER — Other Ambulatory Visit: Payer: Self-pay

## 2019-09-07 DIAGNOSIS — M545 Low back pain, unspecified: Secondary | ICD-10-CM

## 2019-09-07 DIAGNOSIS — G8929 Other chronic pain: Secondary | ICD-10-CM

## 2019-09-07 NOTE — Telephone Encounter (Signed)
Patient's wife was in the office today seeing Dr Rockey Situ. She asked if patient would benefit from having the CT calcium score. Dr Rockey Situ reviewed this patient's record and does not see this would benefit patient because he has a bare metal stent which would only cause artifact.  She verbalized understanding and will call back in November to schedule patient's follow up for January.

## 2019-09-07 NOTE — Therapy (Signed)
[place] MAIN [facility] SERVICES [address] [place], [place], [address] Phone: [phone]   Fax:  [phone]  Physical Therapy Treatment  Patient Details  Name: [name]. MRN: [id number] Date of Birth: Jun 22, 1933 Referring Provider (PT): [name]   Encounter Date: 09/07/2019  PT End of Session - 09/07/19 0953    Visit Number  13    Number of Visits  25    Date for PT Re-Evaluation  10/06/19    Authorization Type  Eval 8/11/0, goals updated 08/27/19    PT Start Time  0935    PT Stop Time  1015    PT Time Calculation (min)  40 min    Activity Tolerance  Patient tolerated treatment well    Behavior During Therapy  [facility] for tasks assessed/performed       Past Medical History:  Diagnosis Date  . Acute embolic stroke ([place]) 05/11/6294  . BPH (benign prostatic hyperplasia)   . Bradycardia 11/10/2015  . Coronary artery disease   . Dizziness 08/21/2012  . DYSPEPSIA 07/31/2010   Qualifier: Diagnosis of  By: [name]    . Hyperlipidemia 05/16/2010   Qualifier: Diagnosis of  By: [name], [name]     . Hypertension   . HYPERTENSION, BENIGN 05/16/2010   Qualifier: Diagnosis of  By: [name], [name]    . Stenosis of right carotid artery 02/01/2017    Past Surgical History:  Procedure Laterality Date  . HIP SURGERY     right hip   . Intestinal Blockage    . LOOP RECORDER INSERTION N/A 05/14/2017   Procedure: Loop Recorder Insertion;  Surgeon: [name], MD;  Location: [place] [facility];  Service: Cardiovascular;  Laterality: N/A;  . SKIN CANCER EXCISION     x2  . TONSILLECTOMY      There were no vitals filed for this visit.  Subjective Assessment - 09/07/19 0949    Subjective  Pt states that he felt really good after the last therapy session and following day. He noticed a gradual return of low back discomfort the second day after therapy. He is complaining of 2/10 soreness in his back upon arrival today. He  reports good compliance with his HEP.  No specific questions or concerns at this time.    Pertinent History  Pt states that his LBP started a few years ago, but doesn't recall any specific MOI.  He states that Dr. Marry Guan suggested taking tyenol PM, which has given him some relief.  He states that it is worse when getting out of bed in AM, as well as with tying his shoes.  He states that he fatigues easily with trunk flexion and that it brings on pain.  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  He denies any numbness and tingling, but does report some referred pain into bilateral posterior thigh that stays above his knee.    Limitations  Sitting;Walking;Standing;Lifting    How long can you sit comfortably?  5-15 min in a car; up to 30-45 min in a supportive chair    How long can you stand comfortably?  pain begins almost immediately    How long can you walk comfortably?  more limited due to LE's, not LBP    Diagnostic tests  x-rays    Patient Stated Goals  to decrease back pain    Currently in Pain?  Yes    Pain Score  2     Pain Location  Back    Pain Orientation  Lower    Pain Descriptors / Indicators  Sore    Pain Type  Chronic pain    Pain Onset  More than a month ago         TREATMENT    Electrical Stimulation  HiVolt estim applied to bilateral lower lumbar paraspinals at pt tolerated intensity (170V on the right, 140V on the L) with moist heat pack on back x 8 minutes;   Manual Therapy  NuStep Warm-up L2 x 6 minutes, (unbilled); Prone CPA joint mobs L2-L5 grade III, 30s/bout x 2 bouts/level;  Prone on elbows CPA joint mobs L3-L5 grade III, 30s/bout x 1 bout/level;  Prone PA hip mobilizations at end range extension, grade 3, 30s/bout x 1 bout bilateral;  Quad stretch x 30s bilateral;   Trigger Point Dry Needling (TDN), unbilled Education performed with patient regarding potential  benefit of TDN. Reviewed precautions and risks with patient. Extensive time spent with pt to ensure full understanding of TDN risks. Pt provided verbal consent to treatment. TDN performed tolumbar paraspinals bilaterallyatL3 and L5with4,0.30x 75single needle placements with local twitch response (LTR). Pistoning technique utilized. Improved pain-free motion following intervention.   Pt educated throughout session about proper posture and technique with exercises. Improved exercise technique, movement at target joints, use of target muscles after min to mod verbal, visual, tactile cues.   Pt had to take a restroom break during session which shortened treatment time. He demonstrated excellent motivation throughout today's session.Continued to utilize dry needling and Hivolt estim today. Also continued bilateral hip PA mobs to improve hip extension. Pt reporting that his back pain seems to be improving with each session. He will continue to benefit from skilled PT services in order to decrease his pain and to increase his spinal mobility in order to improve to full function at home and to improve his QOL.                         PT Short Term Goals - 08/27/19 1235      PT SHORT TERM GOAL #1   Title  Pt will be independent with HEP in order to improve strength and decrease back pain in order to improve pain-free function at home.    Time  6    Period  Weeks    Status  Partially Met    Target Date  08/25/19        PT Long Term Goals - 08/27/19 2482      PT LONG TERM GOAL #1   Title  Pt will decrease worst back pain as reported on NPRS by at least 2 points in order to demonstrate clinically significant reduction in back pain.    Baseline  07/14/19: worst = 4/10; 08/27/19: worst = 3/10    Time  12    Period  Weeks    Status  Partially Met    Target Date  10/06/19      PT LONG TERM GOAL #2   Title  Pt will increase strength of by at least 1/2  MMT grade in order to demonstrate improvement in strength and function.    Baseline  07/14/19: B hip flexion 4/5, B hip ER 4+/5, B hip abduction 4+, B hip adduction 4/5, B hip extension 4/5; 08/27/19: B hip flexion 5/5, B hip ER 4+/5, B hip abduction 4+/4+, B  hip adduction 5/5, B hip extension 5/5    Time  12    Period  Weeks    Status  Achieved      PT LONG TERM GOAL #3   Title  Pt will be able to sit for at least 30 minutes while riding in a car withou tincrease in his back pain in order to improve tolerance for travel to appointments and personal events.    Baseline  07/14/19: 5-15 minutes before back pain increases; 08/27/19: without back support pain increases within minutes, but with back support he can ride for 30-min to an hour without pain.    Time  12    Period  Weeks    Status  Partially Met    Target Date  10/06/19      PT LONG TERM GOAL #4   Title  Pt will decrease mODI scoreby at least 13 points in order demonstrate clinically significant reduction in back pain/disability.    Baseline  07/14/19: Pt to complete at next visit; 07/16/19: 24/50=48%; 08/27/19: 21/50 = 42%    Time  12    Period  Weeks    Status  Partially Met    Target Date  10/06/19            Plan - 09/07/19 0370    Clinical Impression Statement  Pt had to take a restroom break during session which shortened treatment time. He demonstrated excellent motivation throughout today's session. Continued to utilize dry needling and Hivolt estim today. Also continued bilateral hip PA mobs to improve hip extension. Pt reporting that his back pain seems to be improving with each session. He will continue to benefit from skilled PT services in order to decrease his pain and to increase his spinal mobility in order to improve to full function at home and to improve his QOL.    Personal Factors and Comorbidities  Age;Behavior Pattern;Comorbidity 3+;Time since onset of injury/illness/exacerbation    Comorbidities  hx of stroke,  CAD, HLD, HTN, age, chronicity of onset, hx of hip surgery    Examination-Activity Limitations  Sit;Sleep;Lift;Bend;Squat;Stand    Examination-Participation Restrictions  Community Activity;Yard Work    [employer] officer  Evolving/Moderate complexity    Rehab Potential  Fair    PT Frequency  2x / week    PT Duration  12 weeks    PT Treatment/Interventions  Canalith Repostioning;Cryotherapy;Electrical Stimulation;Iontophoresis 32m/ml Dexamethasone;Moist Heat;Traction;Ultrasound;Functional mobility training;Therapeutic activities;Therapeutic exercise;Balance training;Neuromuscular re-education;Patient/family education;Manual techniques;Dry needling;Spinal Manipulations;Joint Manipulations;Aquatic Therapy;Passive range of motion;Vestibular;Vasopneumatic Device    PT Next Visit Plan  CPAs and UPA's of lumbar spine; manual therapy; progress therex as tolerated    PT Home Exercise Plan  Access Code: [email or web address](Hooklying lumbar rotation, ant/post pelvic tilt, seated hamstring stretch, standing side bending stretch),  lying in prone intermittently throughout the day for 3-5 minutes at a time    Consulted and Agree with Plan of Care  Patient       Patient will benefit from skilled therapeutic intervention in order to improve the following deficits and impairments:  Difficulty walking, Hypomobility, Increased muscle spasms, Pain, Impaired flexibility, Decreased strength, Decreased activity tolerance, Abnormal gait  Visit Diagnosis: Chronic bilateral low back pain without sciatica     Problem List Patient Active Problem List   Diagnosis Date Noted  . Stenosis of right carotid artery 02/01/2017  . Acute embolic stroke ([place] 045/01/8881 . Bradycardia 11/10/2015  . Essential tremor 06/03/2015  . Dizziness 08/21/2012  . Syncope 04/10/2011  . CALF PAIN,  LEFT 10/11/2010  . DYSPEPSIA 07/31/2010  . CHEST DISCOMFORT 07/31/2010  . Hyperlipidemia 05/16/2010  . HYPERTENSION, BENIGN  05/16/2010  . CAD, NATIVE VESSEL 05/16/2010   Phillips Grout PT, DPT, GCS  Abegail Kloeppel 09/07/2019, 11:17 AM  [place] MAIN [facility] SERVICES [address] [place], [place], [address] Phone: [phone]   Fax:  [phone]  Name: [name] [facility]. MRN: [id number] Date of Birth: 08-29-1933

## 2019-09-08 ENCOUNTER — Ambulatory Visit: Payer: Medicare Other

## 2019-09-09 ENCOUNTER — Other Ambulatory Visit: Payer: Self-pay

## 2019-09-09 ENCOUNTER — Ambulatory Visit: Payer: Medicare Other

## 2019-09-09 DIAGNOSIS — M545 Low back pain, unspecified: Secondary | ICD-10-CM

## 2019-09-09 DIAGNOSIS — G8929 Other chronic pain: Secondary | ICD-10-CM

## 2019-09-09 NOTE — Therapy (Signed)
[place] MAIN [facility] SERVICES [address] [place], [place], [address] Phone: [phone]   Fax:  [phone]  Physical Therapy Treatment  Patient Details  Name: [name]. MRN: [id number] Date of Birth: 30-Oct-1933 Referring Provider (PT): [name]   Encounter Date: 09/09/2019  PT End of Session - 09/10/19 2106    Visit Number  14    Number of Visits  25    Date for PT Re-Evaluation  10/06/19    Authorization Type  Eval 8/11/0, goals updated 08/27/19    PT Start Time  0948    PT Stop Time  1030    PT Time Calculation (min)  42 min    Activity Tolerance  Patient tolerated treatment well    Behavior During Therapy  [facility] for tasks assessed/performed       Past Medical History:  Diagnosis Date  . Acute embolic stroke ([place]) 08/07/767  . BPH (benign prostatic hyperplasia)   . Bradycardia 11/10/2015  . Coronary artery disease   . Dizziness 08/21/2012  . DYSPEPSIA 07/31/2010   Qualifier: Diagnosis of  By: [name]    . Hyperlipidemia 05/16/2010   Qualifier: Diagnosis of  By: [name], [name]     . Hypertension   . HYPERTENSION, BENIGN 05/16/2010   Qualifier: Diagnosis of  By: [name], [name]    . Stenosis of right carotid artery 02/01/2017    Past Surgical History:  Procedure Laterality Date  . HIP SURGERY     right hip   . Intestinal Blockage    . LOOP RECORDER INSERTION N/A 05/14/2017   Procedure: Loop Recorder Insertion;  Surgeon: [name], MD;  Location: [place] [facility];  Service: Cardiovascular;  Laterality: N/A;  . SKIN CANCER EXCISION     x2  . TONSILLECTOMY      There were no vitals filed for this visit.  Subjective Assessment - 09/10/19 2105    Subjective  Pt states that he felt sore after the last therapy session. He feels like his back pain is roughly the same over the last few visits and believes that he might be reaching a plateau. He continues to experience bilateral LE "heaviness" and  fatigue especially when walking. He complains fo 2/10 soreness in his back again today. He reports good compliance with his HEP.  No specific questions at this time.    Pertinent History  Pt states that his LBP started a few years ago, but doesn't recall any specific MOI.  He states that Dr. Marry Guan suggested taking tyenol PM, which has given him some relief.  He states that it is worse when getting out of bed in AM, as well as with tying his shoes.  He states that he fatigues easily with trunk flexion and that it brings on pain.  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  He denies any numbness and tingling, but does report some referred pain into bilateral posterior thigh that stays above his knee.    Limitations  Sitting;Walking;Standing;Lifting    How long can you sit comfortably?  5-15 min in a car; up to 30-45 min in a supportive chair    How long can you stand comfortably?  pain begins almost immediately    How long can you walk comfortably?  more limited due to LE's, not LBP    Diagnostic tests  x-rays  Patient Stated Goals  to decrease back pain    Currently in Pain?  Yes    Pain Score  2     Pain Location  Back    Pain Orientation  Lower    Pain Descriptors / Indicators  Sore    Pain Type  Chronic pain    Pain Onset  More than a month ago    Pain Frequency  Intermittent         TREATMENT    Electrical Stimulation HiVolt estim applied to bilateral lower lumbar paraspinals at pt tolerated intensity (210V on the right, 180V on the L) with moist heat pack on back x 8 minutes;   Manual Therapy NuStep Warm-up L2 x 6 minutes, (2 minutes unbilled); Extensive discussion regarding interval history with visit at the [place] and ABI test performed yesterday as it relates to his bilateral LE aching; ProneCPA joint mobsL2-L5 grade III, 30s/bout x 2bouts/level; Proneon elbowsCPA joint mobsL3-L5  grade III, 30s/boutx 1bout/level; Prone PA hip mobilizations at end range extension, grade 3, 30s/bout x 1 bout bilateral;  Quad stretch x 30s bilateral;   Trigger Point Dry Needling (TDN), unbilled Education performed with patient regarding potential benefit of TDN. Reviewed precautions and risks with patient. Extensive time spent with pt to ensure full understanding of TDN risks. Pt provided verbal consent to treatment. TDN performed tolumbar paraspinals bilaterallyatL3 and L5with4,0.30x 75single needle placements with local twitch response (LTR). Pistoning technique utilized. Improved pain-free motion following intervention.   Pt educated throughout session about proper posture and technique with exercises. Improved exercise technique, movement at target joints, use of target muscles after min to mod verbal, visual, tactile cues.    Pt had to use the restroom again today which shortened the session. Continued to utilize dry needling and Hivolt estim today. Also continued bilateral hip PA mobs to improve hip extension. Pt states that he feels like he is reaching a plateau with respect to his back pain. He is considering possible discharge over the next couple visit. He would like to get result of ABI test first. He will continue to benefit from skilled PT services in order to decrease his pain and to increase his spinal mobility in order to improve to full function at home and to improve his QOL.                                PT Short Term Goals - 08/27/19 1235      PT SHORT TERM GOAL #1   Title  Pt will be independent with HEP in order to improve strength and decrease back pain in order to improve pain-free function at home.    Time  6    Period  Weeks    Status  Partially Met    Target Date  08/25/19        PT Long Term Goals - 08/27/19 1749      PT LONG TERM GOAL #1   Title  Pt will decrease worst back pain as  reported on NPRS by at least 2 points in order to demonstrate clinically significant reduction in back pain.    Baseline  07/14/19: worst = 4/10; 08/27/19: worst = 3/10    Time  12    Period  Weeks    Status  Partially Met    Target Date  10/06/19      PT LONG TERM GOAL #2   Title  Pt will increase strength of by at least 1/2 MMT grade in order to demonstrate improvement in strength and function.    Baseline  07/14/19: B hip flexion 4/5, B hip ER 4+/5, B hip abduction 4+, B hip adduction 4/5, B hip extension 4/5; 08/27/19: B hip flexion 5/5, B hip ER 4+/5, B hip abduction 4+/4+, B hip adduction 5/5, B hip extension 5/5    Time  12    Period  Weeks    Status  Achieved      PT LONG TERM GOAL #3   Title  Pt will be able to sit for at least 30 minutes while riding in a car withou tincrease in his back pain in order to improve tolerance for travel to appointments and personal events.    Baseline  07/14/19: 5-15 minutes before back pain increases; 08/27/19: without back support pain increases within minutes, but with back support he can ride for 30-min to an hour without pain.    Time  12    Period  Weeks    Status  Partially Met    Target Date  10/06/19      PT LONG TERM GOAL #4   Title  Pt will decrease mODI scoreby at least 13 points in order demonstrate clinically significant reduction in back pain/disability.    Baseline  07/14/19: Pt to complete at next visit; 07/16/19: 24/50=48%; 08/27/19: 21/50 = 42%    Time  12    Period  Weeks    Status  Partially Met    Target Date  10/06/19            Plan - 09/10/19 2106    Clinical Impression Statement  Pt had to use the restroom again today which shortened the session. Continued to utilize dry needling and Hivolt estim today. Also continued bilateral hip PA mobs to improve hip extension. Pt states that he feels like he is reaching a plateau with respect to his back pain. He is considering possible discharge over the next couple visit. He would  like to get result of ABI test first. He will continue to benefit from skilled PT services in order to decrease his pain and to increase his spinal mobility in order to improve to full function at home and to improve his QOL.    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/Moderate complexity    Rehab Potential  Fair    PT Frequency  2x / week    PT Duration  12 weeks    PT Treatment/Interventions  Canalith Repostioning;Cryotherapy;Electrical Stimulation;Iontophoresis 2m/ml Dexamethasone;Moist Heat;Traction;Ultrasound;Functional mobility training;Therapeutic activities;Therapeutic exercise;Balance training;Neuromuscular re-education;Patient/family education;Manual techniques;Dry needling;Spinal Manipulations;Joint Manipulations;Aquatic Therapy;Passive range of motion;Vestibular;Vasopneumatic Device    PT Next Visit Plan  CPAs and UPA's of lumbar spine; manual therapy; progress therex as tolerated. Consider discharge over the next few visits as pt is reaching a plateau    PT Home Exercise Plan  Access Code: [email or web address](Hooklying lumbar rotation, ant/post pelvic tilt, seated hamstring stretch, standing side bending stretch),  lying in prone intermittently throughout the day for 3-5 minutes at a time    Consulted and Agree with Plan of Care  Patient       Patient will benefit from skilled  therapeutic intervention in order to improve the following deficits and impairments:  Difficulty walking, Hypomobility, Increased muscle spasms, Pain, Impaired flexibility, Decreased strength, Decreased activity tolerance, Abnormal gait  Visit Diagnosis: Chronic  bilateral low back pain without sciatica     Problem List Patient Active Problem List   Diagnosis Date Noted  . Stenosis of right carotid artery 02/01/2017  . Acute embolic stroke ([place]) 67/12/4101  . Bradycardia 11/10/2015  . Essential tremor 06/03/2015  . Dizziness 08/21/2012  . Syncope 04/10/2011  . CALF PAIN, LEFT 10/11/2010  . DYSPEPSIA 07/31/2010  . CHEST DISCOMFORT 07/31/2010  . Hyperlipidemia 05/16/2010  . HYPERTENSION, BENIGN 05/16/2010  . CAD, NATIVE VESSEL 05/16/2010   Phillips Grout PT, DPT, GCS  Huprich,Jason 09/10/2019, 9:07 PM  [place] MAIN [facility] SERVICES [address] [place], [place], [address] Phone: [phone]   Fax:  [phone]  Name: [name] [facility]. MRN: [id number] Date of Birth: [birth year]

## 2019-09-10 ENCOUNTER — Ambulatory Visit: Payer: Medicare Other

## 2019-09-14 ENCOUNTER — Ambulatory Visit: Payer: Medicare Other

## 2019-09-14 ENCOUNTER — Other Ambulatory Visit: Payer: Self-pay

## 2019-09-14 DIAGNOSIS — R2681 Unsteadiness on feet: Secondary | ICD-10-CM

## 2019-09-14 DIAGNOSIS — G8929 Other chronic pain: Secondary | ICD-10-CM

## 2019-09-14 DIAGNOSIS — M545 Low back pain, unspecified: Secondary | ICD-10-CM

## 2019-09-14 DIAGNOSIS — M6281 Muscle weakness (generalized): Secondary | ICD-10-CM

## 2019-09-14 NOTE — Therapy (Signed)
[place] MAIN [facility] [address] [place], [place], [address] Phone: [phone]   Fax:  [phone]  Physical Therapy Discharge  Patient Details  Name: [name]. MRN: [id number] Date of Birth: [birth year] Referring Provider (PT): [name]   Encounter Date: 09/14/2019  PT End of Session - 09/14/19 1116    Visit Number  15    Number of Visits  25    Date for PT Re-Evaluation  10/06/19    Authorization Type  Eval 8/11/0, goals updated 09/14/19    PT Start Time  1103    PT Stop Time  1145    PT Time Calculation (min)  42 min    Activity Tolerance  Patient tolerated treatment well    Behavior During Therapy  [facility] for tasks assessed/performed       Past Medical History:  Diagnosis Date  . Acute embolic stroke ([place]) 0/01/6377  . BPH (benign prostatic hyperplasia)   . Bradycardia 11/10/2015  . Coronary artery disease   . Dizziness 08/21/2012  . DYSPEPSIA 07/31/2010   Qualifier: Diagnosis of  By: [name]    . Hyperlipidemia 05/16/2010   Qualifier: Diagnosis of  By: [name], [name]     . Hypertension   . HYPERTENSION, BENIGN 05/16/2010   Qualifier: Diagnosis of  By: [name], [name]    . Stenosis of right carotid artery 02/01/2017    Past Surgical History:  Procedure Laterality Date  . HIP SURGERY     right hip   . Intestinal Blockage    . LOOP RECORDER INSERTION N/A 05/14/2017   Procedure: Loop Recorder Insertion;  Surgeon: [name], MD;  Location: [place] CV [facility];  Service: Cardiovascular;  Laterality: N/A;  . SKIN CANCER EXCISION     x2  . TONSILLECTOMY      There were no vitals filed for this visit.  Subjective Assessment - 09/14/19 1110    Subjective  Pt states that he feels about the same today.  He reports that at times he has no pain, but that it can get up to a 5/10 when he is "using it".  He reports that he feels like he has plateaued, and would like to discharge at this time.   He reports that his HEP is going well.  No new questions or concerns at this time.    Pertinent History  Pt states that his LBP started a few years ago, but doesn't recall any specific MOI.  He states that Dr. Marry Guan suggested taking tyenol PM, which has given him some relief.  He states that it is worse when getting out of bed in AM, as well as with tying his shoes.  He states that he fatigues easily with trunk flexion and that it brings on pain.  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  He denies any numbness and tingling, but does report some referred pain into bilateral posterior thigh that stays above his knee.    Limitations  Sitting;Walking;Standing;Lifting    How long can you sit comfortably?  5-15 min in a car; up to 30-45 min in a supportive chair    How long can you stand comfortably?  pain begins almost immediately    How long can you walk comfortably?  more limited due to LE's, not LBP    Diagnostic tests  x-rays    Patient Stated  Goals  to decrease back pain    Currently in Pain?  Yes    Pain Score  1     Pain Location  Back    Pain Orientation  Lower    Pain Descriptors / Indicators  Sore    Pain Type  Chronic pain    Pain Onset  More than a month ago    Pain Frequency  Intermittent         TREATMENT  There-ex:   HEP reviewed extensively for discharge.  Pt demonstrates good technique and understanding of exercises, and verbalizes understanding with exercise progression.  mODI - 36%, unbilled time to complete   Manual Therapy NuStep Warm-up L2 x 6 minutes, (2 minutes unbilled); ProneUPA joint mobsL2-L5 grade III, 30s/bout x 2bouts/level; ProneCPA joint mobsT10-L5 grade III, 30s/bout x 2bouts/level;   Pt educated throughout session about proper posture and technique with exercises. Improved exercise technique, movement at target joints, use of target muscles  after min to mod verbal, visual, tactile cues.    Pt demonstrates good motivation throughout today's session.  He reports approximately 50-60% improvement in his back pain since starting therapy. He endorsed a 36% on the mODI today, demonstrating improvement from his baseline of 48%.  He endorses his worst pain as a 5/10, but often states that he has a 1/10 or 0/10 pain.  He states that he can ride in a car for 30 min without back pain, as long as he has lumbar support, however, without lumbar support he gets pain within minutes.  His HEP was reviewed extensively today, demonstrating good technique and verbalizing an understanding of how to progress his exercises as they become easy.  Pt initially made good gains with his back pain and motion, however plateaued after his first few visits.  At this point, he is ready to discharge.  If new or worsening problems arise, he was instructed that he can ask his provider for another referral to PT.                     PT Short Term Goals - 09/14/19 1116      PT SHORT TERM GOAL #1   Title  Pt will be independent with HEP in order to improve strength and decrease back pain in order to improve pain-free function at home.    Time  6    Period  Weeks    Status  Achieved    Target Date  08/25/19        PT Long Term Goals - 09/14/19 1116      PT LONG TERM GOAL #1   Title  Pt will decrease worst back pain as reported on NPRS by at least 2 points in order to demonstrate clinically significant reduction in back pain.    Baseline  07/14/19: worst = 4/10; 08/27/19: worst = 3/10; 09/14/19: worst = 5/10    Time  12    Period  Weeks    Status  Partially Met    Target Date  10/06/19      PT LONG TERM GOAL #2   Title  Pt will increase strength of by at least 1/2 MMT grade in order to demonstrate improvement in strength and function.    Baseline  07/14/19: B hip flexion 4/5, B hip ER 4+/5, B hip abduction 4+, B hip adduction 4/5, B hip  extension 4/5; 08/27/19: B hip flexion 5/5, B hip ER 4+/5, B hip abduction 4+/4+, B hip adduction 5/5,  B hip extension 5/5    Time  12    Period  Weeks    Status  Achieved      PT LONG TERM GOAL #3   Title  Pt will be able to sit for at least 30 minutes while riding in a car withou tincrease in his back pain in order to improve tolerance for travel to appointments and personal events.    Baseline  07/14/19: 5-15 minutes before back pain increases; 08/27/19: without back support pain increases within minutes, but with back support he can ride for 30-min to an hour without pain.; 09/14/19: without back support pain increases within minutes, but with back support he can ride for 30-min to an hour without pain    Time  12    Period  Weeks    Status  Partially Met    Target Date  10/06/19      PT LONG TERM GOAL #4   Title  Pt will decrease mODI scoreby at least 13 points in order demonstrate clinically significant reduction in back pain/disability.    Baseline  07/14/19: Pt to complete at next visit; 07/16/19: 24/50=48%; 08/27/19: 21/50 = 42%; 09/14/19: 36%    Time  12    Period  Weeks    Status  Partially Met    Target Date  10/06/19            Plan - 09/14/19 1753    Clinical Impression Statement  Pt demonstrates good motivation throughout today's session.  He reports approximately 50-60% improvement in his back pain since starting therapy. He endorsed a 36% on the mODI today, demonstrating improvement from his baseline of 48%.  He endorses his worst pain as a 5/10, but often states that he has a 1/10 or 0/10 pain.  He states that he can ride in a car for 30 min without back pain, as long as he has lumbar support, however, without lumbar support he gets pain within minutes.  His HEP was reviewed extensively today, demonstrating good technique and verbalizing an understanding of how to progress his exercises as they become easy.  Pt initially made good gains with his back pain and motion, however  plateaued after his first few visits.  At this point, he is ready to discharge.  If new or worsening problems arise, he was instructed that he can ask his provider for another referral to PT.    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/Moderate complexity    Rehab Potential  Fair    PT Frequency  2x / week    PT Duration  12 weeks    PT Treatment/Interventions  Canalith Repostioning;Cryotherapy;Electrical Stimulation;Iontophoresis 84m/ml Dexamethasone;Moist Heat;Traction;Ultrasound;Functional mobility training;Therapeutic activities;Therapeutic exercise;Balance training;Neuromuscular re-education;Patient/family education;Manual techniques;Dry needling;Spinal Manipulations;Joint Manipulations;Aquatic Therapy;Passive range of motion;Vestibular;Vasopneumatic Device    PT Next Visit Plan  pt is discharging today    PT Home Exercise Plan  Access Code: [email or web address](Hooklying lumbar rotation, ant/post pelvic tilt, seated hamstring stretch, standing side bending stretch),  lying in prone intermittently throughout the day for 3-5 minutes at a time    Consulted and Agree with Plan of Care  Patient       Patient will benefit from skilled therapeutic intervention in  order to improve the following deficits and impairments:  Difficulty walking, Hypomobility, Increased muscle spasms, Pain, Impaired flexibility, Decreased strength, Decreased activity tolerance, Abnormal gait  Visit Diagnosis: Chronic bilateral low back pain without sciatica  Muscle weakness (generalized)  Unsteadiness on feet     Problem List Patient Active Problem List   Diagnosis Date  Noted  . Stenosis of right carotid artery 02/01/2017  . Acute embolic stroke ([place]) 81/85/9093  . Bradycardia 11/10/2015  . Essential tremor 06/03/2015  . Dizziness 08/21/2012  . Syncope 04/10/2011  . CALF PAIN, LEFT 10/11/2010  . DYSPEPSIA 07/31/2010  . CHEST DISCOMFORT 07/31/2010  . Hyperlipidemia 05/16/2010  . HYPERTENSION, BENIGN 05/16/2010  . CAD, NATIVE VESSEL 05/16/2010     This entire session was performed under direct supervision and direction of a licensed therapist/therapist assistant . I have personally read, edited and approve of the note as written.   Lutricia Horsfall, SPT Phillips Grout PT, DPT, GCS  Huprich,Jason 09/15/2019, 10:19 AM  [place] MAIN [facility] SERVICES [address] [place], [place], [address] Phone: [phone]   Fax:  [phone]  Name: [name]. MRN: [id number] Date of Birth: Nov 19, 1933

## 2019-09-16 ENCOUNTER — Ambulatory Visit: Payer: Medicare Other

## 2019-09-29 ENCOUNTER — Ambulatory Visit (INDEPENDENT_AMBULATORY_CARE_PROVIDER_SITE_OTHER): Payer: Medicare Other | Admitting: *Deleted

## 2019-09-29 DIAGNOSIS — I639 Cerebral infarction, unspecified: Secondary | ICD-10-CM

## 2019-09-30 LAB — CUP PACEART REMOTE DEVICE CHECK
Date Time Interrogation Session: 20201027184249
Implantable Pulse Generator Implant Date: 20180612

## 2019-10-19 NOTE — Progress Notes (Signed)
[facility] Summary Report / Loop Recorder 

## 2019-11-02 ENCOUNTER — Ambulatory Visit (INDEPENDENT_AMBULATORY_CARE_PROVIDER_SITE_OTHER): Payer: Medicare Other | Admitting: *Deleted

## 2019-11-02 DIAGNOSIS — R55 Syncope and collapse: Secondary | ICD-10-CM | POA: Diagnosis not present

## 2019-11-02 LAB — CUP PACEART REMOTE DEVICE CHECK
Date Time Interrogation Session: 20201129134326
Implantable Pulse Generator Implant Date: 20180612

## 2019-11-25 NOTE — Progress Notes (Signed)
ILR remote 

## 2019-12-03 ENCOUNTER — Ambulatory Visit (INDEPENDENT_AMBULATORY_CARE_PROVIDER_SITE_OTHER): Payer: Medicare Other | Admitting: *Deleted

## 2019-12-03 DIAGNOSIS — R55 Syncope and collapse: Secondary | ICD-10-CM

## 2019-12-03 LAB — CUP PACEART REMOTE DEVICE CHECK
Date Time Interrogation Session: 20201230230654
Implantable Pulse Generator Implant Date: 20180612

## 2019-12-09 ENCOUNTER — Other Ambulatory Visit: Payer: Self-pay | Admitting: Cardiovascular Disease

## 2019-12-16 ENCOUNTER — Ambulatory Visit: Payer: Medicare Other | Admitting: Cardiovascular Disease

## 2019-12-21 NOTE — Progress Notes (Signed)
Cardiology Office Note  Date:  12/22/2019   ID:  Calvin Shiner., DOB [birth year], MRN [id number]  PCP:  Idelle Crouch, MD   Chief Complaint  Patient presents with  . other    12 month follow up. Meds reviewed by the pt. verbally. "doing well."     HPI:  [name] is a 86 year-old gentleman with a history of  coronary artery disease,   stent placed to his proximal RCA in September 2007, taxis stent 3.0 x 24 mm, previous syncopal episode (Etiology of his spell is uncertain),  82% carotid disease on the right CVA who presents for routine followup of his coronary artery disease,  Tremor, and stroke  Denies any significant shortness of breath, chest pain  Patient's wife presents with him today Memory is stable  Completed PT Does tylenol arthritis at nighttime to help with arthritic pain when he sleeps  Gets out to walk, does this on a regular basis, 1 up to 1-1/2 miles a day Weight stable Wife reports that the eat healthy  Loop monitor results reviewed with him in detail, no significant arrhythmia  Lab work reviewed with him in detail Total chol; 125, LDL 62 TG 85  EKG personally reviewed by myself on todays visit Sinus bradycardia rate 53  Other past medical history reviewed  fall while in [place] 2019 Was watching a football game, stood up and had a mechanical fall tripped over some shoes 10/24/2018 Suffered a hip fracture , was hospitalized  Following the surgery the physicians increased his lisinopril up to 20 mg daily and placed him on aspirin 325 mg daily He has started to have some upset stomach, wonders if it could be the high-dose aspirin  Prior trip to [place] in 2018 developed bronchitis  We reviewed his loop monitor results In general this shows no significant arrhythmia, one episode of 6 minutes likely artifact but unable to exclude arrhythmia  he denies any tachycardia or palpitations  Still with peripheral vision issues from prior  stroke  still with mild tremor Trying to get back to his exercise program Cholesterol at goal  EKG personally reviewed by myself on todays visit Shows normal sinus rhythm with rate 57 bpm unable to exclude old inferior MI  Other past medical history reviewed February 2018 he had acute vision changes, word finding difficulty. He was taken by life flight to [facility] hospital MRI/MRA showing acute stroke Details of scan as below: 1. Acute left parieto-occipital intraparenchymal hematoma with adjacent subarachnoid hemorrhage. This most likely represents hemorrhagic transformation from an embolic infarct given its configuration. No underlying mass. No other findings to suggest amyloid angiopathy. 2. Normal MRA of the head.  He had a CT scan of the neck showing 50% stenosis on the left, 10% on the right  At the time of his stroke he was on aspirin and Plavix. Plavix was held  echocardiogram  NORMAL LEFT VENTRICULAR SYSTOLIC FUNCTION WITH MILD LVH NORMAL RIGHT VENTRICULAR SYSTOLIC FUNCTION VALVULAR REGURGITATION: TRIVIAL AR, TRIVIAL TR VALVULAR STENOSIS: TRIVIAL AS VERY POOR SOUND TRANSMISSION-DEFINITY CONTRAST USED NO PRIOR STUDY FOR COMPARISON   Prior to his stroke he was started on Sinemet for tremor Wife reports this did not help very much  He had a nuclear Myoview earlier in 2014 that showed no ischemia. He has chronic dizziness when he changes position. This has been a chronic issue and he has learned to "live with it". No significant progression from previous visits.  stress test on Apr 20 2011 which showed no significant ischemia, normal ejection fraction estimated at 69%. There was a very small region of decreased perfusion suggestive of small region of old scar or attenuation artifact in the apical wall. Overall this was a low risk scan. He was able to perform on the treadmill well with a maximum heart rate of 128 beats per minute, exercised for 8-1/2 minutes, peak blood pressure  194/81.   PMH:   has a past medical history of Acute embolic stroke (HCC) (02/01/2017), BPH (benign prostatic hyperplasia), Bradycardia (11/10/2015), Coronary artery disease, Dizziness (08/21/2012), DYSPEPSIA (07/31/2010), Hyperlipidemia (05/16/2010), Hypertension, HYPERTENSION, BENIGN (05/16/2010), and Stenosis of right carotid artery (02/01/2017).  PSH:    Past Surgical History:  Procedure Laterality Date  . HIP SURGERY     right hip   . Intestinal Blockage    . LOOP RECORDER INSERTION N/A 05/14/2017   Procedure: Loop Recorder Insertion;  Surgeon: Duke Salvia, MD;  Location: [facility] INVASIVE CV LAB;  Service: Cardiovascular;  Laterality: N/A;  . SKIN CANCER EXCISION     x2  . TONSILLECTOMY      Current Outpatient Medications  Medication Sig Dispense Refill  . acetaminophen (TYLENOL) 650 MG CR tablet Take 650 mg by mouth every 8 (eight) hours as needed.     Marland Kitchen Apoaequorin (PREVAGEN) 10 MG CAPS Take by mouth daily.    Marland Kitchen aspirin 81 MG tablet Take 1 tablet (81 mg total) by mouth daily. 30 tablet   . b complex vitamins tablet Take 1 tablet by mouth daily.    . cholecalciferol (VITAMIN D) 1000 units tablet Take 2,000 Units by mouth daily.    Marland Kitchen donepezil (ARICEPT) 5 MG tablet Take 1 tablet at bedtime.    Marland Kitchen ezetimibe (ZETIA) 10 MG tablet Take 1 tablet (10 mg total) by mouth daily. 90 tablet 0  . FLAXSEED, LINSEED, PO Take 2 g by mouth daily.    Marland Kitchen lisinopril (PRINIVIL,ZESTRIL) 10 MG tablet Take 1 tablet (10 mg total) by mouth daily. 90 tablet 3  . Meth-Hyo-M Bl-Na Phos-Ph Sal (URIBEL) 118 MG CAPS Take by mouth as needed.     . nitroGLYCERIN (NITROSTAT) 0.4 MG SL tablet Place 1 tablet (0.4 mg total) under the tongue every 5 (five) minutes as needed for chest pain. 25 tablet 3  . pregabalin (LYRICA) 150 MG capsule Take 150 mg by mouth 2 (two) times daily.     . simvastatin (ZOCOR) 40 MG tablet Take 40 mg by mouth every evening.     . Skin Protectants, Misc. (DERMACERIN) CREA Apply topically.    .  tamsulosin (FLOMAX) 0.4 MG CAPS capsule Take 2 capsules (0.8 mg total) by mouth daily. 30 capsule 0   No current facility-administered medications for this visit.     Allergies:   Codeine and Penicillins   Social History:  The patient  reports that he has never smoked. He has never used smokeless tobacco. He reports current alcohol use of about 6.0 - 8.0 standard drinks of alcohol per week. He reports that he does not use drugs.   Family History:   family history includes Cancer in an other family member; Hyperlipidemia in an other family member; Hypertension in his mother and another family member; Other in an other family member.    Review of Systems: Review of Systems  Constitutional: Negative.   HENT: Negative.   Eyes:       Vision deficits  Respiratory: Negative.   Cardiovascular: Negative.   Gastrointestinal: Negative.   Musculoskeletal:  Negative.        Gait instability  Neurological: Negative.        Word finding difficulty, peripheral vision deficit  Psychiatric/Behavioral: Positive for memory loss.  All other systems reviewed and are negative.    PHYSICAL EXAM: VS:  BP 140/60 (BP Location: Left Arm, Patient Position: Sitting, Cuff Size: Normal)   Pulse (!) 53   Ht 5' 7.5" (1.715 m)   Wt 182 lb 4 oz (82.7 kg)   BMI 28.12 kg/m  , BMI Body mass index is 28.12 kg/m.  Constitutional:  oriented to person, place, and time. No distress.  HENT:  Head: Grossly normal Eyes:  no discharge. No scleral icterus.  Neck: No JVD, no carotid bruits  Cardiovascular: Regular rate and rhythm, no murmurs appreciated Pulmonary/Chest: Clear to auscultation bilaterally, no wheezes or rails Abdominal: Soft.  no distension.  no tenderness.  Musculoskeletal: Normal range of motion Neurological:  normal muscle tone. Coordination normal. No atrophy Skin: Skin warm and dry Psychiatric: normal affect, pleasant   Recent Labs: No results found for requested labs within last 8760 hours.     Lipid Panel Lab Results  Component Value Date   CHOL 122 01/23/2010   HDL 33.8 01/23/2010   LDLCALC 69.6 01/23/2010   TRIG 93 01/23/2010    total chol  Wt Readings from Last 3 Encounters:  12/22/19 182 lb 4 oz (82.7 kg)  12/16/18 180 lb 12 oz (82 kg)  12/12/18 181 lb 8 oz (82.3 kg)     ASSESSMENT AND PLAN:  Bradycardia Loop in place, results reviewed, no arrhythmia No mention of bradycardia but he is bradycardic on today's EKG rate 53 bpm Reports he is asymptomatic Will monitor for now  Hyperlipidemia, unspecified hyperlipidemia type - Cholesterol is at goal on the current lipid regimen. No changes to the medications were made.  HYPERTENSION, BENIGN - Plan: EKG 12-Lead  lisinopril 10 mg daily BP stable  Atherosclerosis of native coronary artery of native heart without angina pectoris - Currently with no symptoms of angina. No further workup at this time. Continue current medication regimen.  Syncope, unspecified syncope type - Plan: EKG 12-Lead No recent episodes of syncope or near syncope   Monitor in place, no significant arrhythmia  Acute embolic stroke (HCC) Loop monitor in place,no arrhythmia  No atrial fib  Stenosis of right carotid artery 50% disease on the right  recommended aggressive cholesterol management No further testing at this time  Essential tremor Followed by neurology Not able to tolerate beta-blockers given bradycardia   Total encounter time more than 25 minutes  Greater than 50% was spent in counseling and coordination of care with the patient  Disposition:   F/U  12 months   Orders Placed This Encounter  Procedures  . EKG 12-Lead     Signed, [name] Arbour, M.D., Ph.D. 12/22/2019  [facility] [place], [place] [phone]

## 2019-12-22 ENCOUNTER — Ambulatory Visit (INDEPENDENT_AMBULATORY_CARE_PROVIDER_SITE_OTHER): Payer: Medicare Other | Admitting: Cardiovascular Disease

## 2019-12-22 ENCOUNTER — Encounter: Payer: Self-pay | Admitting: Cardiovascular Disease

## 2019-12-22 ENCOUNTER — Other Ambulatory Visit: Payer: Self-pay

## 2019-12-22 VITALS — BP 140/60 | HR 53 | Ht 67.5 in | Wt 182.2 lb

## 2019-12-22 DIAGNOSIS — R002 Palpitations: Secondary | ICD-10-CM

## 2019-12-22 DIAGNOSIS — I6521 Occlusion and stenosis of right carotid artery: Secondary | ICD-10-CM | POA: Diagnosis not present

## 2019-12-22 DIAGNOSIS — E782 Mixed hyperlipidemia: Secondary | ICD-10-CM

## 2019-12-22 DIAGNOSIS — I639 Cerebral infarction, unspecified: Secondary | ICD-10-CM

## 2019-12-22 DIAGNOSIS — R55 Syncope and collapse: Secondary | ICD-10-CM | POA: Diagnosis not present

## 2019-12-22 DIAGNOSIS — Z959 Presence of cardiac and vascular implant and graft, unspecified: Secondary | ICD-10-CM

## 2019-12-22 DIAGNOSIS — I25118 Atherosclerotic heart disease of native coronary artery with other forms of angina pectoris: Secondary | ICD-10-CM | POA: Diagnosis not present

## 2019-12-22 MED ORDER — NITROGLYCERIN 0.4 MG SL SUBL: 0.4000 mg | SUBLINGUAL_TABLET | SUBLINGUAL | 3 refills | Status: DC | PRN

## 2019-12-22 NOTE — Patient Instructions (Signed)

## 2020-01-04 ENCOUNTER — Ambulatory Visit (INDEPENDENT_AMBULATORY_CARE_PROVIDER_SITE_OTHER): Payer: Medicare Other | Admitting: *Deleted

## 2020-01-04 DIAGNOSIS — R55 Syncope and collapse: Secondary | ICD-10-CM

## 2020-01-04 LAB — CUP PACEART REMOTE DEVICE CHECK
Date Time Interrogation Session: 20210201001521
Implantable Pulse Generator Implant Date: 20180612

## 2020-01-04 NOTE — Progress Notes (Signed)
ILR Remote 

## 2020-01-16 ENCOUNTER — Other Ambulatory Visit: Payer: Self-pay

## 2020-01-16 ENCOUNTER — Emergency Department
Admission: EM | Admit: 2020-01-16 | Discharge: 2020-01-16 | Disposition: A | Discharge status: Home / Self Care | Payer: Medicare Other | Attending: Emergency Medicine | Admitting: Emergency Medicine

## 2020-01-16 ENCOUNTER — Emergency Department: Payer: Medicare Other

## 2020-01-16 DIAGNOSIS — R531 Weakness: Secondary | ICD-10-CM | POA: Diagnosis not present

## 2020-01-16 DIAGNOSIS — I2510 Atherosclerotic heart disease of native coronary artery without angina pectoris: Secondary | ICD-10-CM | POA: Insufficient documentation

## 2020-01-16 DIAGNOSIS — Z79899 Other long term (current) drug therapy: Secondary | ICD-10-CM | POA: Diagnosis not present

## 2020-01-16 DIAGNOSIS — I10 Essential (primary) hypertension: Secondary | ICD-10-CM | POA: Insufficient documentation

## 2020-01-16 DIAGNOSIS — R55 Syncope and collapse: Secondary | ICD-10-CM | POA: Diagnosis not present

## 2020-01-16 DIAGNOSIS — R42 Dizziness and giddiness: Secondary | ICD-10-CM | POA: Diagnosis not present

## 2020-01-16 LAB — CBC WITH DIFFERENTIAL/PLATELET
Abs Immature Granulocytes: 0.02 10*3/uL (ref 0.00–0.07)
Basophils Absolute: 0 10*3/uL (ref 0.0–0.1)
Basophils Relative: 1 %
Eosinophils Absolute: 0.2 10*3/uL (ref 0.0–0.5)
Eosinophils Relative: 2 %
HCT: 47.2 % (ref 39.0–52.0)
Hemoglobin: 16 g/dL (ref 13.0–17.0)
Immature Granulocytes: 0 %
Lymphocytes Relative: 31 %
Lymphs Abs: 2.5 10*3/uL (ref 0.7–4.0)
MCH: 32.1 pg (ref 26.0–34.0)
MCHC: 33.9 g/dL (ref 30.0–36.0)
MCV: 94.8 fL (ref 80.0–100.0)
Monocytes Absolute: 0.8 10*3/uL (ref 0.1–1.0)
Monocytes Relative: 10 %
Neutro Abs: 4.5 10*3/uL (ref 1.7–7.7)
Neutrophils Relative %: 56 %
Platelets: 183 10*3/uL (ref 150–400)
RBC: 4.98 MIL/uL (ref 4.22–5.81)
RDW: 13.2 % (ref 11.5–15.5)
WBC: 8 10*3/uL (ref 4.0–10.5)
nRBC: 0 % (ref 0.0–0.2)

## 2020-01-16 LAB — BASIC METABOLIC PANEL
Anion gap: 10 (ref 5–15)
BUN: 28 mg/dL — ABNORMAL HIGH (ref 8–23)
CO2: 31 mmol/L (ref 22–32)
Calcium: 9.5 mg/dL (ref 8.9–10.3)
Chloride: 100 mmol/L (ref 98–111)
Creatinine, Ser: 1.32 mg/dL — ABNORMAL HIGH (ref 0.61–1.24)
GFR calc Af Amer: 57 mL/min — ABNORMAL LOW (ref >60)
GFR calc non Af Amer: 49 mL/min — ABNORMAL LOW (ref >60)
Glucose, Bld: 92 mg/dL (ref 70–99)
Potassium: 3.9 mmol/L (ref 3.5–5.1)
Sodium: 141 mmol/L (ref 135–145)

## 2020-01-16 LAB — TROPONIN I (HIGH SENSITIVITY)
Troponin I (High Sensitivity): 6 ng/L (ref <18)
Troponin I (High Sensitivity): 5 ng/L (ref <18)

## 2020-01-16 LAB — MAGNESIUM: Magnesium: 2.5 mg/dL — ABNORMAL HIGH (ref 1.7–2.4)

## 2020-01-16 MED ORDER — SODIUM CHLORIDE 0.9 % IV BOLUS
500.0000 mL | Freq: Once | INTRAVENOUS | Status: AC
  Administered 2020-01-16: 500 mL via INTRAVENOUS

## 2020-01-16 NOTE — ED Notes (Signed)
Pt stated that at home he felt dizzy when he was lying flat on his back, and wanted the head of the bed lowered to see if the dizziness returned.  When the [facility] was lowered, he began to feel more dizzy.  Rates his dizziness as a 4 now.

## 2020-01-16 NOTE — ED Provider Notes (Signed)
7:42 AM Assumed care for off going team.   Blood pressure (!) 144/66, pulse (!) 56, temperature (!) 97.5 F (36.4 C), temperature source Oral, resp. rate 14, height 5\' 11"  (1.803 m), weight 87.4 kg, SpO2 99 %.  See their HPI for full report but in brief MRI pending-Syncope episodes has loop recorder- nothing determined on loop. Syncope last night. Put also having dizziness, positional. IF not able to do MRI can do CTA head neck.      IMPRESSION:  1. No acute intracranial abnormality.  2. Mild chronic small vessel ischemic disease and moderate cerebral  atrophy.  3. Interval chronic hemorrhagic left temporoparietal infarct.  4. Negative head MRA.   We reviewed patient's prior records and he had a history of a hemorrhagic bleed 3 years ago.  It was in the same area.   Discussed with patient updated on results.  Patient felt comfortable with going home at this time.  Patient will follow up with his cardiologist.  I discussed the provisional nature of ED diagnosis, the treatment so far, the ongoing plan of care, follow up appointments and return precautions with the patient and any family or support people present. They expressed understanding and agreed with the plan, discharged home.        , MD 01/16/20 [phone]

## 2020-01-16 NOTE — ED Triage Notes (Signed)
Pt arrived via [facility] from home.  EMS was called earlier tonight when he fell, wife said at that time he was unconscious.  Upon arrival EMS reported BP was 110/70, HR in 50s (normally 60s).  He laid down but felt dizzy and weak, called EMS again.  BP 113/62 at that time, CBG 134.  Hx of stint. Wearing holter monitor (?) at home, said they were looking for afib.  Pt states he had second covid vaccine shot on Tuesday 2/9.  Upon arrival, pt stood and pivoted to get into bed, alert and oriented, tremors at baseline, in no apparent distress.  He states he is not feeling as dizzy as he was; he is feeling better.

## 2020-01-16 NOTE — ED Provider Notes (Signed)
[facility] Emergency Department Provider Note  ____________________________________________   First MD Initiated Contact with Patient 01/16/20 0302     (approximate)  I have reviewed the triage vital signs and the nursing notes.   HISTORY  Chief Complaint Loss of Consciousness    HPI [name]. is a 86 y.o. male with medical history as listed below which notably includes resting tremor, prior syncopal episodes for which he has worn a heart monitor for years.  Dr. Mariah Milling seems to be his primary cardiologist although he has also been evaluated by Dr. Graciela Husbands.  He presents tonight by EMS after the patient passed out.  He tells me that he felt fine yesterday and then went to bed.  He got up around 2 AM to go to the bathroom which is common for him.  After he went to the bathroom he passed out.  He does not remember exactly what happened but he does not believe that he tripped.  He has no headache and no neck pain.  Reportedly he had trouble getting up and he felt weak and dizzy again when he tried to get back to his feet.  He feels better at this time but says he still feels a bit lightheaded.  He has had no recent fever, chest pain, and shortness of breath, nor abdominal pain, and he also denies diarrhea and vomiting.  He received his second Covid vaccination about 4 days ago but has no current complaints.  He has no focal numbness nor weakness in his extremities.  He has no known history of heart failure although he does have a history of ACS with one coronary artery stent.  The onset of the symptoms was acute and his symptoms were severe but he is feeling better now.        Past Medical History:  Diagnosis Date  . Acute embolic stroke (HCC) 02/01/2017  . BPH (benign prostatic hyperplasia)   . Bradycardia 11/10/2015  . Coronary artery disease   . Dizziness 08/21/2012  . DYSPEPSIA 07/31/2010   Qualifier: Diagnosis of  By: Dalphine Handing    .  Hyperlipidemia 05/16/2010   Qualifier: Diagnosis of  By: Mariah Milling MD, [name]     . Hypertension   . HYPERTENSION, BENIGN 05/16/2010   Qualifier: Diagnosis of  By: Mariah Milling MD, [name]    . Stenosis of right carotid artery 02/01/2017    Patient Active Problem List   Diagnosis Date Noted  . Stenosis of right carotid artery 02/01/2017  . Acute embolic stroke (HCC) 02/01/2017  . Bradycardia 11/10/2015  . Essential tremor 06/03/2015  . Dizziness 08/21/2012  . Syncope 04/10/2011  . CALF PAIN, LEFT 10/11/2010  . DYSPEPSIA 07/31/2010  . CHEST DISCOMFORT 07/31/2010  . Hyperlipidemia 05/16/2010  . HYPERTENSION, BENIGN 05/16/2010  . CAD, NATIVE VESSEL 05/16/2010    Past Surgical History:  Procedure Laterality Date  . HIP SURGERY     right hip   . Intestinal Blockage    . LOOP RECORDER INSERTION N/A 05/14/2017   Procedure: Loop Recorder Insertion;  Surgeon: Duke Salvia, MD;  Location: [facility] INVASIVE CV LAB;  Service: Cardiovascular;  Laterality: N/A;  . SKIN CANCER EXCISION     x2  . TONSILLECTOMY      Prior to Admission medications   Medication Sig Start Date End Date Taking? Authorizing Provider  acetaminophen (TYLENOL) 650 MG CR tablet Take 650 mg by mouth every 8 (eight) hours as needed.     [provider]  Apoaequorin (PREVAGEN) 10 MG CAPS Take by mouth daily.    [provider]  aspirin 81 MG tablet Take 1 tablet (81 mg total) by mouth daily. 12/12/18   Antonieta Iba, MD  b complex vitamins tablet Take 1 tablet by mouth daily.    [provider]  cholecalciferol (VITAMIN D) 1000 units tablet Take 2,000 Units by mouth daily.    [provider]  donepezil (ARICEPT) 5 MG tablet Take 1 tablet at bedtime. 01/06/19   [provider]  ezetimibe (ZETIA) 10 MG tablet Take 1 tablet (10 mg total) by mouth daily. 12/09/19   Antonieta Iba, MD  FLAXSEED, LINSEED, PO Take 2 g by mouth daily.    [provider]  lisinopril (PRINIVIL,ZESTRIL) 10 MG  tablet Take 1 tablet (10 mg total) by mouth daily. 12/12/18   Antonieta Iba, MD  Meth-Hyo-M Bl-Na Phos-Ph Sal (URIBEL) 118 MG CAPS Take by mouth as needed.  01/03/19   [provider]  nitroGLYCERIN (NITROSTAT) 0.4 MG SL tablet Place 1 tablet (0.4 mg total) under the tongue every 5 (five) minutes as needed for chest pain. 12/22/19   Antonieta Iba, MD  pregabalin (LYRICA) 150 MG capsule Take 150 mg by mouth 2 (two) times daily.     [provider]  simvastatin (ZOCOR) 40 MG tablet Take 40 mg by mouth every evening.     [provider]  Skin Protectants, Misc. (DERMACERIN) CREA Apply topically. 11/11/19 11/10/20  [provider]  tamsulosin (FLOMAX) 0.4 MG CAPS capsule Take 2 capsules (0.8 mg total) by mouth daily. 04/21/19   Vanna Scotland, MD    Allergies Codeine and Penicillins  Family History  Problem Relation Age of Onset  . Hypertension Mother   . Cancer Other   . Hyperlipidemia Other   . Hypertension Other   . Other Other        CHF    Social History Social History   Tobacco Use  . Smoking status: Never Smoker  . Smokeless tobacco: Never Used  Substance Use Topics  . Alcohol use: Yes    Alcohol/week: 2.0 standard drinks    Types: 2 Standard drinks or equivalent per week  . Drug use: No    Review of Systems Constitutional: No fever/chills Eyes: No visual changes. ENT: No sore throat. Cardiovascular: Syncope and collapse.  Denies chest pain. Respiratory: Denies shortness of breath. Gastrointestinal: No abdominal pain.  No nausea, no vomiting.  No diarrhea.  No constipation. Genitourinary: Negative for dysuria. Musculoskeletal: Negative for neck pain.  Negative for back pain. Integumentary: Negative for rash. Neurological: Negative for headaches, focal weakness or numbness.   ____________________________________________   PHYSICAL EXAM:  VITAL SIGNS: ED Triage Vitals  Enc Vitals Group     BP 01/16/20 0256 (!) 164/106      Pulse Rate 01/16/20 0256 (!) 56     Resp 01/16/20 0256 16     Temp 01/16/20 0256 (!) 97.5 F (36.4 C)     Temp Source 01/16/20 0256 Oral     SpO2 01/16/20 0256 99 %     Weight 01/16/20 0301 87.4 kg (192 lb 10.9 oz)     Height 01/16/20 0301 1.803 m (5\' 11" )     Head Circumference --      Peak Flow --      Pain Score 01/16/20 0301 0     Pain Loc --      Pain Edu? --  Excl. in GC? --     Constitutional: Alert and oriented.  No acute distress at this time. Eyes: Conjunctivae are normal.  Head: Atraumatic. Nose: No congestion/rhinnorhea. Mouth/Throat: Patient is wearing a mask. Neck: No stridor.  No meningeal signs.   Cardiovascular: Normal rate, regular rhythm. Good peripheral circulation. Grossly normal heart sounds. Respiratory: Normal respiratory effort.  No retractions. Gastrointestinal: Soft and nontender. No distention.  Musculoskeletal: No lower extremity tenderness nor edema. No gross deformities of extremities. Neurologic:  Normal speech and language. No gross focal neurologic deficits are appreciated.  Skin:  Skin is warm, dry and intact. Psychiatric: Mood and affect are normal. Speech and behavior are normal.  ____________________________________________   LABS (all labs ordered are listed, but only abnormal results are displayed)  Labs Reviewed  BASIC METABOLIC PANEL - Abnormal; Notable for the following components:      Result Value   BUN 28 (*)    Creatinine, Ser 1.32 (*)    GFR calc non Af Amer 49 (*)    GFR calc Af Amer 57 (*)    All other components within normal limits  MAGNESIUM - Abnormal; Notable for the following components:   Magnesium 2.5 (*)    All other components within normal limits  CBC WITH DIFFERENTIAL/PLATELET  TROPONIN I (HIGH SENSITIVITY)  TROPONIN I (HIGH SENSITIVITY)   ____________________________________________  EKG  ED ECG REPORT I, Loleta Rose, the attending physician, personally viewed and interpreted this ECG.  Date:  01/16/2020 EKG Time: 2:55 AM Rate: 56 Rhythm: Sinus bradycardia with first-degree AV block QRS Axis: normal Intervals: Prolonged PR interval at 242 ms ST/T Wave abnormalities: Non-specific ST segment / T-wave changes, but no clear evidence of acute ischemia. Narrative Interpretation: no definitive evidence of acute ischemia; does not meet STEMI criteria.  EKG is complicated by the patient's resting tremor.   ____________________________________________  RADIOLOGY Marylou Mccoy, personally viewed and evaluated these images (plain radiographs) as part of my medical decision making, as well as reviewing the written report by the radiologist.  ED MD interpretation: No indication for emergent imaging  Official radiology report(s): No results found.  ____________________________________________   PROCEDURES   Procedure(s) performed (including Critical Care):  Procedures   ____________________________________________   INITIAL IMPRESSION / MDM / ASSESSMENT AND PLAN / ED COURSE  As part of my medical decision making, I reviewed the following data within the electronic MEDICAL RECORD NUMBER Nursing notes reviewed and incorporated, Labs reviewed , EKG interpreted , Old chart reviewed, Patient signed out to Dr. Fuller Plan, Discussed with radiologist and Notes from prior ED visits  Differential diagnosis includes, but is not limited to, vasovagal episode, orthostatic hypotension, cardiogenic syncope, acute infection, ACS, PE.  The patient is generally well-appearing at this time and in no acute distress.  I reviewed his medical record and see that he has had similar episodes in the past.  Based on his description of getting up to go to the bathroom after being asleep and then passing out, I suspect that he had an orthostatic episode or vasovagal episode.  He is stable at this time.  No history of congestive heart failure, no hypotension tonight.  I will continue to give a small fluid bolus, check  basic labs, keep him on the monitor, and then reassess.  At this point I do not believe he needs admission but I will determine after additional observation.        Clinical Course as of Jan 16 708  Sat Jan 16, 2020  0643 Reassuring 2nd troponin  Troponin I (High Sensitivity): 5 [CF]  [phone] The patient is still reporting dizziness and a feeling of unsteadiness with change in position.  I think this most likely represents vertigo, but the patient has a history of an embolic stroke and he has had a syncopal episode and is worried about his persistent symptoms.  I called and spoke with the radiologist regarding the presence of a loop recorder.  He thought it was likely we would be able to go ahead and proceed with an MRI as most of the loop recorder devices are compatible.  If for some reason we cannot obtain the MRI, he suggested that a noncontrast head CT and CTA head and neck could potentially be beneficial given the patient's presentation and history.I was able to print out the procedure note with the device information (from 05/14/2017) and I will order the MR brain and MRA head.  The MRI technologist should be able to investigate the specific device to see if it is compatible.   [CF]  [phone] Transferring ED care to Dr. Jari Pigg to follow up on imaging and determine appropriate disposition.   [CF]    Clinical Course User Index [CF] Hinda Kehr, MD     ____________________________________________  FINAL CLINICAL IMPRESSION(S) / ED DIAGNOSES  Final diagnoses:  Syncope and collapse  Dizziness     MEDICATIONS GIVEN DURING THIS VISIT:  Medications  sodium chloride 0.9 % bolus 500 mL (0 mLs Intravenous Stopped 01/16/20 0515)     ED Discharge Orders    None      *Please note:  Delorise Shiner. was evaluated in Emergency Department on 01/16/2020 for the symptoms described in the history of present illness. He was evaluated in the context of the global COVID-19 pandemic, which  necessitated consideration that the patient might be at risk for infection with the SARS-CoV-2 virus that causes COVID-19. Institutional protocols and algorithms that pertain to the evaluation of patients at risk for COVID-19 are in a state of rapid change based on information released by regulatory bodies including the CDC and federal and state organizations. These policies and algorithms were followed during the patient's care in the ED.  Some ED evaluations and interventions may be delayed as a result of limited staffing during the pandemic.*  Note:  This document was prepared using Dragon voice recognition software and may include unintentional dictation errors.   Hinda Kehr, MD 01/16/20 [phone]

## 2020-01-16 NOTE — Discharge Instructions (Signed)
Your work-up was reassuring with the MRI as below.  No signs of a stroke today you can follow-up with your cardiologist and let them know what happened.  Return to the ER for any other concerns   IMPRESSION:  1. No acute intracranial abnormality.  2. Mild chronic small vessel ischemic disease and moderate cerebral  atrophy.  3. Interval chronic hemorrhagic left temporoparietal infarct.  4. Negative head MRA.

## 2020-01-16 NOTE — ED Notes (Signed)
Pt reports he is feeling better.  He is still feeling a little bit dizzy.  He states if the dizziness at home was a 10, this is now a 2.

## 2020-01-16 NOTE — ED Notes (Signed)
Pt transported to MRI at this time 

## 2020-01-18 NOTE — Progress Notes (Signed)
Office Visit    Patient Name: [name]. Date of Encounter: 01/19/2020  Primary Care Provider:  [name], MD Primary Cardiologist:  [name], MD  Chief Complaint    86 year old male with history of CAD s/p PCI to pRCA (2007), history of presyncope and syncope, s/p mechanical fall and R femoral neck fx (2019), right carotid disease (50%), 2018 CVA / cryptogenic stroke with hemorrhagic transformation and subsequent loop insertion (05/14/2017), HTN, HLD, OA / DDD, vertigo, basal cell carcinoma, and who presents today for ED follow-up after recent syncopal episode.  Past Medical History    Past Medical History:  Diagnosis Date  . Acute embolic stroke (HCC) 02/01/2017  . BPH (benign prostatic hyperplasia)   . Bradycardia 11/10/2015  . Coronary artery disease   . Dizziness 08/21/2012  . DYSPEPSIA 07/31/2010   Qualifier: Diagnosis of  By: Dalphine Handing    . Hyperlipidemia 05/16/2010   Qualifier: Diagnosis of  By: Mariah Milling MD, [name]     . Hypertension   . HYPERTENSION, BENIGN 05/16/2010   Qualifier: Diagnosis of  By: Mariah Milling MD, [name]    . Stenosis of right carotid artery 02/01/2017   Past Surgical History:  Procedure Laterality Date  . HIP SURGERY     right hip   . Intestinal Blockage    . LOOP RECORDER INSERTION N/A 05/14/2017   Procedure: Loop Recorder Insertion;  Surgeon: Duke Salvia, MD;  Location: [facility] INVASIVE CV LAB;  Service: Cardiovascular;  Laterality: N/A;  . SKIN CANCER EXCISION     x2  . TONSILLECTOMY      Allergies  Allergies  Allergen Reactions  . Codeine Nausea Only and Nausea And Vomiting    Other reaction(s): Vomiting  . Penicillins Rash    Rash     History of Present Illness    86 yo male with PMH as above and including CAD s/p DES to [facility] 08/2006 with taxis stent 3.0x36mm. Of note, he reports that he was asymptomatic before needing his stent in the past and without any CP or DOE. He works part time in [employer]. He has a history of essential tremor (followed by neurology) that has not been very responsive to mediations, as well as chronic positional dizziness. He underwent stress test in 2011 and 04/2011 that was without significant ischemia and nl EF. There was a very small region of decreased perfusion, suggestive of old scar or attenuation artifact in the apical wall. Overall, it was ruled a low risk scan. He was able to perform well on the treadmill with a maximum HR of 128bpm and exercised for 8 and 1/2 minutes with peak BP 194/81. He underwent 2014 Myoview that was without ischemia. He suffered a stroke 01/2017 with acute vision changes and aphasia. MRI/MRA showed acute L parieto-occipital intraparenchymal hematoma with adjacent subarachnoid hemorrhage. It was thought this most likely represented hemorrhagic transformation from an embolic infarct, given its configuration. Echo showed nl EF with LAE. A 14 day monitor showed PVCs, correlating with fluttering, as well as nonsustained atrial tachycardia that was irregular but lasted less than 3-5 seconds. He underwent loop recorder insertion 05/2017 for cryptogenic stroke. In 2019, he suffered a mechanical fall in [place] after he stood up and tripped over some shoes. He suffered a hip fx and was hospitalized. He has residual issues with peripheral vision 2/2 CVA. He was most recently seen 12/22/2019. At that time, he was reportedly doing well from a cardiac standpoint.  He had completed PT and was using tylenol arthritis for his pain at night. He was walking 1 to 1.5 miles a day. He was eating healthy. Loop monitor was without significant arrhythmia. He was bradycardic on exam but asymptomatic. Of note, his last transmission was 01/04/20, at which time atrial fibrillation was not present.   Since that time, he has reportedly been doing well.  He denies any chest pain or shortness of breath/dyspnea on exertion.  He has been working out as per usual and without  symptoms, either by walking or using his bike for regular workouts.  He does not get lightheaded or short of breath during these workouts, and he does not experience chest pain.  He does report labile blood pressure at home.  He also reports that he gets intermittent amaurosis fugax and presyncope that has worsened within the last week.  No report of racing heart rate or palpitations.  He reports that he has, within the last week, noticed bright red blood per rectum, and he quantifies the amount as somewhere between a little and a lot.  He reports that he usually drinks a sufficient amount of water (2 64 oz bottles per day) .  He drinks 1 cup of coffee and usually averages 1 alcoholic beverage per week.  He does not add salt to his food.  On 01/16/2020, he was seen in the ED for syncope that occurred the previous night.  He reportedly felt fine the previous evening and then went to bed.  He did note that he drank 3 alcoholic beverages that night, which was more than his usual amount as above. He woke around 2 AM, and after entering the kitchen, he subsequently had a syncopal episode with details unclear as he cannot completely remember the event.  Per his daughter, he usually spends some time on the edge of the bed and seated before standing, in order to prevent LOC 2/2 orthostatic hypotension as in the past.  He cannot remember if he did this. The daughter reports the fall / LOC woke her from sleep that night, as she heard him hit the stovetop buttons as he began to fall (waking her). She found him slumped over on the floor. When she reached him, he was cold to the touch and unable to speak for some time (in until the neighbors arrived). She took his blood pressure and it was low. EMS also took his BP when they arrived and noted it was low.  On discussion with the daughter today, his blood pressure reportedly fluctuates throughout the day.  Per patient, he felt weak and dizzy when he tried to get back on his feet.   In the ED, he was also noted to feel lightheaded.  Of note, he had received his second COVID-19 vaccination 4 days prior.  He reported positional dizziness. MRA was without acute abnormality and showed mild chronic small vessel ischemic dz and moderate cerebral atrophy. It also showed interval chronic hemorrhagic L temporoparietal infarct.  Vitals significant for BP 164/106, HR 56 bpm, T 97.5 F, SPO2 99%, weight 192 pounds. EKG showed sinus bradycardia with first degree AVB and no acute changes. He received IVF and was discharged with recommendation for cardiology follow-up.  Home Medications    Prior to Admission medications   Medication Sig Start Date End Date Taking? Authorizing Provider  acetaminophen (TYLENOL) 650 MG CR tablet Take 650 mg by mouth every 8 (eight) hours as needed.     [provider]  Apoaequorin (PREVAGEN) 10 MG CAPS Take by mouth daily.    [provider]  aspirin 81 MG tablet Take 1 tablet (81 mg total) by mouth daily. 12/12/18   Antonieta Iba, MD  b complex vitamins tablet Take 1 tablet by mouth daily.    [provider]  cholecalciferol (VITAMIN D) 1000 units tablet Take 2,000 Units by mouth daily.    [provider]  donepezil (ARICEPT) 5 MG tablet Take 1 tablet at bedtime. 01/06/19   [provider]  ezetimibe (ZETIA) 10 MG tablet Take 1 tablet (10 mg total) by mouth daily. 12/09/19   Antonieta Iba, MD  FLAXSEED, LINSEED, PO Take 2 g by mouth daily.    [provider]  lisinopril (PRINIVIL,ZESTRIL) 10 MG tablet Take 1 tablet (10 mg total) by mouth daily. 12/12/18   Antonieta Iba, MD  Meth-Hyo-M Bl-Na Phos-Ph Sal (URIBEL) 118 MG CAPS Take by mouth as needed.  01/03/19   [provider]  nitroGLYCERIN (NITROSTAT) 0.4 MG SL tablet Place 1 tablet (0.4 mg total) under the tongue every 5 (five) minutes as needed for chest pain. 12/22/19   Antonieta Iba, MD  pregabalin (LYRICA) 150 MG capsule Take 150 mg by  mouth 2 (two) times daily.     [provider]  simvastatin (ZOCOR) 40 MG tablet Take 40 mg by mouth every evening.     [provider]  Skin Protectants, Misc. (DERMACERIN) CREA Apply topically. 11/11/19 11/10/20  [provider]  tamsulosin (FLOMAX) 0.4 MG CAPS capsule Take 2 capsules (0.8 mg total) by mouth daily. 04/21/19   Vanna Scotland, MD    Review of Systems    He denies chest pain, palpitations, dyspnea, pnd, orthopnea, n, v, syncope, edema, weight gain, or early satiety.  He does report presyncope/dizziness/syncope.  He reports bilateral lower extremity neuropathy.  He also reports amaurosis fugax.  He reports BRBPR within the last week.  He reports aphasia (brief) with his most recent syncopal episode.  All other systems reviewed and are otherwise negative except as noted above.  Physical Exam    VS:  BP 131/78 (BP Location: Left Arm, Patient Position: Sitting, Cuff Size: Normal)   Pulse (!) 58   Ht 5\' 8"  (1.727 m)   Wt 184 lb 8 oz (83.7 kg)   BMI 28.05 kg/m  , BMI Body mass index is 28.05 kg/m. GEN: Elderly male.  Well nourished, well developed, in no acute distress. HEENT: normal. Neck: Supple, no JVD, carotid bruits, or masses. Cardiac: bradycardic but regular, 2/6 systolic murmur. No, rubs, or gallops. No clubbing, cyanosis, edema.  Bilateral skin color changes consistent with PAD. Radials/DP/PT 2+ and equal bilaterally.  Respiratory:  Respirations regular and unlabored, clear to auscultation bilaterally. GI: Soft, nontender, nondistended, BS + x 4. MS: no deformity or atrophy.As above, skin color changes consistent with vascular dz.  Skin: warm and dry, no rash. Neuro:  Strength and sensation are intact. Psych: Normal affect.  Accessory Clinical Findings    ECG personally reviewed by me today -sinus bradycardia with first-degree A-V block, 58 bpm, left axis deviation, previous inferior and anterior infarct possible with poor R wave progression  in inferior and anterior leads and not previously noted in anterior leads- no acute changes.  01/2020 MRA HEAD FINDINGS The visualized distal vertebral arteries are widely patent to the basilar with the right being slightly dominant. Patent left PICA, right AICA, and bilateral SCA origins are visualized. The basilar artery is widely  patent. Posterior communicating arteries are not identified and may be diminutive or absent. PCAs are patent without evidence of significant stenosis. The internal carotid arteries are widely patent from skull base to carotid termini. ACAs and MCAs are patent without evidence of proximal branch occlusion or significant proximal stenosis. No aneurysm is identified. IMPRESSION: 1. No acute intracranial abnormality. 2. Mild chronic small vessel ischemic disease and moderate cerebral atrophy. 3. Interval chronic hemorrhagic left temporoparietal infarct. 4. Negative head MRA. 01/2017 Event monitor  Normal sinus rhythm min HR of 44 bpm, max HR of 118 bpm, and avg HR of 60 bpm.   6 atrial tachycardia runs occurred, the run with the fastest interval lasting 7 beats with a max rate of 118 bpm, the longest lasting 14 beats with an avg rate of 100 bpm.  Isolated SVEs were rare (<1.0%), SVE Couplets were rare (<1.0%), and SVE Triplets were rare (<1.0%). Isolated VEs were rare (<1.0%, 694), VE Couplets were rare (<1.0%, 17), and VE Triplets were rare (<1.0%, 2). Ventricular Bigeminy and Trigeminy were present.  01/2017 Echo INTERPRETATION --------------------------------------------------------------- NORMAL LEFT VENTRICULAR SYSTOLIC FUNCTION WITH MILD LVH NORMAL RIGHT VENTRICULAR SYSTOLIC FUNCTION VALVULAR REGURGITATION: TRIVIAL AR, TRIVIAL TR VALVULAR STENOSIS: TRIVIAL AS VERY POOR SOUND TRANSMISSION-DEFINITY CONTRAST USED NO PRIOR STUDY FOR COMPARISON  2014 Myoview Stage I: Gated stress IR: 3D: Basal inferior septal segment and mid inferior septal  segment are abnormal Stage II: Stress IR: 3D: Multiple segmental abnormalities exist.  See findings.  LVEF 72%.  ED volume 90 ml.  ES volume 25 mL.  Volume curve 57 mL. Stage III: Rest IR: 3D: Basal inferior septal segment, mid inferior septal segment, basal anterior septal segment, and apical lateral segments are abnormal. Stage IV: Stress IR: 3D: [place]: CTA CV: Basal inferior septal segment, apical anterior segment, and apex are abnormal.   Stage V: Breast IR: 3D: [place]: CT AC: Basal inferior septal segment, mid inferior septal segment, basal anteroseptal segment, and apical lateral segment are abnormal  LABS 01/16/2020: Sodium 141, potassium 3.9, creatinine 1.32, BUN 28, magnesium 2.5, hemoglobin 16.0, RBC 4.98, WBC 8.0, hematocrit 47.2, platelets 183 05/2019 TSH 2.653 01/2017 A1C 5.5 11/05/2019 total cholesterol 125, Tg 85, HDL 45.7, LDLc 62  Filed Weights   01/19/20 0822  Weight: 184 lb 8 oz (83.7 kg)  Previous clinic weight 182 pounds  Assessment & Plan    Recent Syncopal Episode History of Presyncope, Dizziness History of Vertigo --S/p recent syncopal episode after waking as above in HPI and with associated aphasia and low BP with elevated BP in the ED.  Loop interrogation without acute events / arrhythmia as below.  Consider syncope 2/2 dehydration and labile pressures after increased alcohol intake. Considered also is previous syncope, presyncope, vertigo. Per primary cardiologist, he also has history of gait instability. Today, he reports a significant amount of BRBPR, described as somewhere between a small and large amount of blood.  Obtain CBC to rule out syncope 2/2 anemia.  Update bilateral carotids given history of right sided stenosis and today's report of new and worsening amaurosis fugax.  Continue to monitor loop. Update echo (previous echo in 2018) to rule out worsening valvular dz. He feels fine today and completed a workout this morning without difficulty, which is reassuring.  Orthostatics negative. Continue to monitor BP and HR at home. They plan to purchase a pulse ox to monitor SpO2 as well.  Further recommendations pending work-up with echo, carotids, and CBC.   BRBPR --- Today, he reports recent BRBPR,  described as more than a small amount but not a large amount of blood.  This has continued over the last week.  Given recent syncopal episode, obtain CBC.   HTN --Blood pressure well controlled today.  Reports labile blood pressure at home. Monitor BP, fluid intake, and salt intake. Discussed alcohol and that could cause effects on BP. Continue to monitor.    Bradycardia --Consider as also as contributing to the above.  Per review of EMR, he has been asymptomatic in the past.  Not on a beta-blocker due to his bradycardia. 05/2019 TSH 2.653.  Could consider recheck of TSH at RTC.  History of cryptogenic stroke with loop insertion (05/2017) --Loop interrogated by device rep today and shows 1 false episode of tachycardia during MRI and otherwise no acute events.  No reported atrial fibrillation and arrhythmia.  Given his aphasia following his syncopal episode, will want to closely monitor going forward.  Carotid artery disease, R Carotid stenosis (50%) --Update bilateral carotids, given most recent episode and report of amaurosis fugax (reportedly different from his visual field changes noted to be associated with residual stroke sx).  Also reports aphasia after his most recent fall. Per primary cardiologist, this was reported at his previous visit as well.  Continue statin for lipid control.  HLD --Continue statin and Zetia.  Most recent lipids as above with LDL at goal below 70. Update carotids.   Essential tremor -Stable.  Followed by neurology.  Self-reported bilateral lower extremity neuropathy, suspected PAD --Denies leg weakness or recent bilateral lower extremity coldness. Color changes noted on exam. No pain or reported weakness with walking. Does report  bilateral numbness. Future considerations could include bilateral lower extremity venous/arterial studies and given history of vascular dz.  CAD s/p PCI to pRCA (2007) --No chest pain reported.  Reportedly asymptomatic prior to his most recent stent.  Given his history, will update an echo to rule out any reduced EF, regional wall motion abnormalities, worsened valvular disease, or any other acute structural changes.  Reassuring that he can complete a full workout this AM without sx. For now, continue current medications with ASA, lisinopril, statin with Zetia.  Continue as needed sublingual nitro.  Not on a beta-blocker given his bradycardia.  --- Disposition: Obtain carotid and echo studies.  Obtain CBC given report of BRBPR.  No medication changes.  Follow-up in 1 month or sooner if needed.  Lennon Alstrom, PA-C 01/19/2020, 9:14 AM

## 2020-01-19 ENCOUNTER — Ambulatory Visit (INDEPENDENT_AMBULATORY_CARE_PROVIDER_SITE_OTHER): Payer: Medicare Other | Admitting: Physician Assistant

## 2020-01-19 ENCOUNTER — Other Ambulatory Visit: Payer: Self-pay

## 2020-01-19 ENCOUNTER — Encounter: Payer: Self-pay | Admitting: Physician Assistant

## 2020-01-19 VITALS — BP 131/78 | HR 58 | Ht 68.0 in | Wt 184.5 lb

## 2020-01-19 DIAGNOSIS — R001 Bradycardia, unspecified: Secondary | ICD-10-CM

## 2020-01-19 DIAGNOSIS — I6521 Occlusion and stenosis of right carotid artery: Secondary | ICD-10-CM

## 2020-01-19 DIAGNOSIS — E785 Hyperlipidemia, unspecified: Secondary | ICD-10-CM | POA: Diagnosis not present

## 2020-01-19 DIAGNOSIS — K625 Hemorrhage of anus and rectum: Secondary | ICD-10-CM

## 2020-01-19 DIAGNOSIS — G250 Essential tremor: Secondary | ICD-10-CM

## 2020-01-19 DIAGNOSIS — R55 Syncope and collapse: Secondary | ICD-10-CM | POA: Diagnosis not present

## 2020-01-19 DIAGNOSIS — Z87898 Personal history of other specified conditions: Secondary | ICD-10-CM

## 2020-01-19 DIAGNOSIS — I2510 Atherosclerotic heart disease of native coronary artery without angina pectoris: Secondary | ICD-10-CM

## 2020-01-19 DIAGNOSIS — Z8673 Personal history of transient ischemic attack (TIA), and cerebral infarction without residual deficits: Secondary | ICD-10-CM

## 2020-01-19 DIAGNOSIS — Z959 Presence of cardiac and vascular implant and graft, unspecified: Secondary | ICD-10-CM

## 2020-01-19 DIAGNOSIS — I10 Essential (primary) hypertension: Secondary | ICD-10-CM

## 2020-01-19 NOTE — Patient Instructions (Signed)
Medication Instructions:  Your physician recommends that you continue on your current medications as directed. Please refer to the Current Medication list given to you today.  *If you need a refill on your cardiac medications before your next appointment, please call your pharmacy*  Lab Work: Your physician recommends that you have lab work today(CBC)  If you have labs (blood work) drawn today and your tests are completely normal, you will receive your results only by: Marland Kitchen MyChart Message (if you have MyChart) OR . A paper copy in the mail If you have any lab test that is abnormal or we need to change your treatment, we will call you to review the results.  Testing/Procedures: 1- Your physician has requested that you have a carotid duplex. This test is an ultrasound of the carotid arteries in your neck. It looks at blood flow through these arteries that supply the brain with blood. Allow one hour for this exam. There are no restrictions or special instructions.  2- Echo  Please return to [facility] on ______________ at _______________ AM/PM for an Echocardiogram. Your physician has requested that you have an echocardiogram. Echocardiography is a painless test that uses sound waves to create images of your heart. It provides your doctor with information about the size and shape of your heart and how well your heart's chambers and valves are working. This procedure takes approximately one hour. There are no restrictions for this procedure. Please note; depending on visual quality an IV may need to be placed.    Follow-Up: At [facility], you and your health needs are our priority.  As part of our continuing mission to provide you with exceptional heart care, we have created designated Provider Care Teams.  These Care Teams include your primary Cardiologist (physician) and Advanced Practice Providers (APPs -  Physician Assistants and Nurse Practitioners) who all work together to  provide you with the care you need, when you need it.  Your next appointment:   1 month(s)  The format for your next appointment:   In Person  Provider:    You may see [name], MD or Marisue Ivan, PA-C.

## 2020-01-20 ENCOUNTER — Encounter: Payer: Self-pay | Admitting: Physician Assistant

## 2020-01-20 LAB — CBC

## 2020-01-22 ENCOUNTER — Other Ambulatory Visit
Admission: RE | Admit: 2020-01-22 | Discharge: 2020-01-22 | Disposition: A | Discharge status: Home / Self Care | Payer: Medicare Other | Source: Ambulatory Visit | Attending: Physician Assistant | Admitting: Physician Assistant

## 2020-01-22 ENCOUNTER — Telehealth: Payer: Self-pay

## 2020-01-22 DIAGNOSIS — K625 Hemorrhage of anus and rectum: Secondary | ICD-10-CM | POA: Diagnosis present

## 2020-01-22 LAB — CBC
HCT: 43.8 % (ref 39.0–52.0)
Hemoglobin: 14.9 g/dL (ref 13.0–17.0)
MCH: 32.4 pg (ref 26.0–34.0)
MCHC: 34 g/dL (ref 30.0–36.0)
MCV: 95.2 fL (ref 80.0–100.0)
Platelets: 161 10*3/uL (ref 150–400)
RBC: 4.6 MIL/uL (ref 4.22–5.81)
RDW: 13 % (ref 11.5–15.5)
WBC: 7.4 10*3/uL (ref 4.0–10.5)
nRBC: 0.3 % — ABNORMAL HIGH (ref 0.0–0.2)

## 2020-01-22 NOTE — Telephone Encounter (Signed)
-----   Message from Lennon Alstrom, PA-C sent at 01/21/2020  6:21 PM EST ----- Please let Mr. Simonich know that it appears the lab had an error during the processing of his blood work. He should come back for a repeat CBC if able, so we can check his blood counts given his recent symptoms of dizziness with loss of consciousness and report of blood in the toilet over the last week. Please apologize for the inconvenience.  Please also reach out to the lab and ensure that he does not receive a charge for the lab error.

## 2020-01-22 NOTE — Telephone Encounter (Signed)
Call to patient to make him aware of need for repeat labs d/t resulting error.   Pt verbalized understanding and order was placed for [facility] today.   Pt agreed to go over there this afternoon.   Advised pt to call for any further questions or concerns.

## 2020-01-25 ENCOUNTER — Telehealth: Payer: Self-pay

## 2020-01-25 NOTE — Telephone Encounter (Signed)
-----   Message from Lennon Alstrom, PA-C sent at 01/24/2020  6:44 PM EST ----- Please let [name] known that his blood work showed nucleated red blood cells, which will need further workup by his PCP. Please make sure he has an appointment soon with his PCP. In the meantime, it will be helpful for him to repeat a CBC with differential to ensure that the results of his original blood work are accurate, and to assist his PCP with further information at the time of his follow-up appointment.

## 2020-01-25 NOTE — Telephone Encounter (Signed)
Call to patient to discuss lab results and POC from provider. Pt verbalized understanding and will call PCP for appt.   PCP with [facility]. I let him know that I will fax results to Dr. [name] office.  Fax sent.   Advised pt to call for any further questions or concerns. No further orders at this time.

## 2020-02-04 ENCOUNTER — Ambulatory Visit (INDEPENDENT_AMBULATORY_CARE_PROVIDER_SITE_OTHER): Payer: Medicare Other | Admitting: *Deleted

## 2020-02-04 DIAGNOSIS — R55 Syncope and collapse: Secondary | ICD-10-CM

## 2020-02-04 LAB — CUP PACEART REMOTE DEVICE CHECK
Date Time Interrogation Session: 20210304025212
Implantable Pulse Generator Implant Date: 20180612

## 2020-02-04 NOTE — Progress Notes (Signed)
ILR Remote 

## 2020-02-25 ENCOUNTER — Ambulatory Visit (INDEPENDENT_AMBULATORY_CARE_PROVIDER_SITE_OTHER): Payer: Medicare Other

## 2020-02-25 ENCOUNTER — Other Ambulatory Visit: Payer: Self-pay

## 2020-02-25 DIAGNOSIS — R55 Syncope and collapse: Secondary | ICD-10-CM | POA: Diagnosis not present

## 2020-02-28 DIAGNOSIS — Z8673 Personal history of transient ischemic attack (TIA), and cerebral infarction without residual deficits: Secondary | ICD-10-CM | POA: Insufficient documentation

## 2020-02-28 NOTE — Progress Notes (Signed)
Cardiology Office Note  Date:  02/29/2020   ID:  [name] [name]., DOB Apr 26, 1933, MRN [id number]  PCP:  [name], MD   Chief Complaint  Patient presents with  . office visit    1 month F/U after cardiac testing; Meds vebrally reviewed with patient.    HPI:  [name] is a 86 year-old gentleman with a history of  coronary artery disease,   stent placed to his proximal RCA in September 2007, taxis stent 3.0 x 24 mm, previous syncopal episode (Etiology of his spell is uncertain),  50% carotid disease on the right CVA who presents for routine followup of his coronary artery disease,  Tremor, and stroke  01/16/2020,  ED for syncope 3 alcoholic beverages that night, which was more than his usual  woke around 2 AM, and after entering the kitchen, had a syncopal episode  --received his second COVID-19 vaccination 4 days prior. usually spends some time on the edge of the bed and seated before standing, in order to prevent dizziness  daughter reports the fall / LOC woke her from sleep She found him slumped over on the floor.  he was cold to the touch and unable to speak  blood pressure was low.  EMS took his BP when they arrived and noted it was low.  MRA was without acute abnormality and showed mild chronic small vessel ischemic dz and moderate cerebral atrophy. chronic hemorrhagic L temporoparietal infarct.    BP 164/106, HR 56 bpm, T 97.5 F, SPO2 99%, weight 192 pounds. EKG showed sinus bradycardia with first degree AVB and no acute changes. He received IVF and was discharged   Echo 1. Left ventricular ejection fraction, by estimation, is 55%. The left  ventricle has normal function. The left ventricle has no regional wall  motion abnormalities. Left ventricular diastolic parameters are consistent  with Grade I diastolic dysfunction  (impaired relaxation).  2. Right ventricular systolic function is normal. The right ventricular  size is normal. There is normal  pulmonary artery systolic pressure.  3. Mild mitral valve regurgitation.  4. The aortic valve is normal in structure. Aortic valve regurgitation is  mild. Mild to moderate aortic valve sclerosis/calcification is present,  with borderline mild aortic stenosis. Aortic valve mean gradient measures  10.0 mmHg.  Patient's wife presents with him today Very slow in the AM, Uses a walker, then cane , gets stiff Stiff if he sits for one hour  Does mix of bike and walking Denies any significant shortness of breath, chest pain  Memory is stable   Continues to have trouble with arthritis  Loop monitor results reviewed , no significant arrhythmia  Lab work reviewed with him in detail Total chol; 125, LDL 62 TG 85  EKG personally reviewed by myself on todays visit Sinus bradycardia rate 50 poor R wave progression to the anterior precordial leads, left axis deviation, unable to exclude old inferior MI  Other past medical history reviewed  fall while in [place] 2019 Was watching a football game, stood up and had a mechanical fall tripped over some shoes 10/24/2018 Suffered a hip fracture , was hospitalized  Following the surgery the physicians increased his lisinopril up to 20 mg daily and placed him on aspirin 325 mg daily He has started to have some upset stomach, wonders if it could be the high-dose aspirin  We reviewed his loop monitor results In general this shows no significant arrhythmia, one episode of 6 minutes likely artifact but unable  to exclude arrhythmia  he denies any tachycardia or palpitations  Still with peripheral vision issues from prior stroke  February 2018 he had acute vision changes, word finding difficulty. He was taken by life flight to [facility] hospital MRI/MRA showing acute stroke Details of scan as below: 1. Acute left parieto-occipital intraparenchymal hematoma with adjacent subarachnoid hemorrhage. This most likely represents hemorrhagic transformation from an  embolic infarct given its configuration. No underlying mass. No other findings to suggest amyloid angiopathy. 2. Normal MRA of the head.  He had a CT scan of the neck showing 50% stenosis on the left, 10% on the right  At the time of his stroke he was on aspirin and Plavix. Plavix was held  echocardiogram  NORMAL LEFT VENTRICULAR SYSTOLIC FUNCTION WITH MILD LVH NORMAL RIGHT VENTRICULAR SYSTOLIC FUNCTION VALVULAR REGURGITATION: TRIVIAL AR, TRIVIAL TR VALVULAR STENOSIS: TRIVIAL AS VERY POOR SOUND TRANSMISSION-DEFINITY CONTRAST USED NO PRIOR STUDY FOR COMPARISON      PMH:   has a past medical history of Acute embolic stroke (HCC) (02/01/2017), BPH (benign prostatic hyperplasia), Bradycardia (11/10/2015), Coronary artery disease, Dizziness (08/21/2012), DYSPEPSIA (07/31/2010), Hyperlipidemia (05/16/2010), Hypertension, HYPERTENSION, BENIGN (05/16/2010), and Stenosis of right carotid artery (02/01/2017).  PSH:    Past Surgical History:  Procedure Laterality Date  . HIP SURGERY     right hip   . Intestinal Blockage    . LOOP RECORDER INSERTION N/A 05/14/2017   Procedure: Loop Recorder Insertion;  Surgeon: Duke Salvia, MD;  Location: [facility] INVASIVE CV LAB;  Service: Cardiovascular;  Laterality: N/A;  . SKIN CANCER EXCISION     x2  . TONSILLECTOMY      Current Outpatient Medications  Medication Sig Dispense Refill  . acetaminophen (TYLENOL) 650 MG CR tablet Take 650 mg by mouth every 8 (eight) hours as needed.     Marland Kitchen aspirin 81 MG tablet Take 1 tablet (81 mg total) by mouth daily. 30 tablet   . b complex vitamins tablet Take 1 tablet by mouth daily.    . betamethasone dipropionate 0.05 % cream Apply 1 application topically 2 (two) times daily as needed.    . cholecalciferol (VITAMIN D) 1000 units tablet Take 2,000 Units by mouth daily.    Marland Kitchen donepezil (ARICEPT) 5 MG tablet Take 1 tablet at bedtime.    Marland Kitchen ezetimibe (ZETIA) 10 MG tablet Take 1 tablet (10 mg total) by mouth daily. 90 tablet 0  .  FLAXSEED, LINSEED, PO Take 2 g by mouth daily.    Marland Kitchen lisinopril (PRINIVIL,ZESTRIL) 10 MG tablet Take 1 tablet (10 mg total) by mouth daily. 90 tablet 3  . Meth-Hyo-M Bl-Na Phos-Ph Sal (URIBEL) 118 MG CAPS Take by mouth as needed.     . nitroGLYCERIN (NITROSTAT) 0.4 MG SL tablet Place 1 tablet (0.4 mg total) under the tongue every 5 (five) minutes as needed for chest pain. 25 tablet 3  . pregabalin (LYRICA) 150 MG capsule Take 150 mg by mouth 2 (two) times daily.     . simvastatin (ZOCOR) 40 MG tablet Take 40 mg by mouth every evening.     . Skin Protectants, Misc. (DERMACERIN) CREA Apply topically.    . tamsulosin (FLOMAX) 0.4 MG CAPS capsule Take 2 capsules (0.8 mg total) by mouth daily. 30 capsule 0   No current facility-administered medications for this visit.     Allergies:   Codeine and Penicillins   Social History:  The patient  reports that he has never smoked. He has never used smokeless tobacco. He  reports current alcohol use. He reports that he does not use drugs.   Family History:   family history includes Cancer in an other family member; Hyperlipidemia in an other family member; Hypertension in his mother and another family member; Other in an other family member.    Review of Systems: Review of Systems  Constitutional: Negative.   HENT: Negative.   Eyes:       Vision deficits  Respiratory: Negative.   Cardiovascular: Negative.   Gastrointestinal: Negative.   Musculoskeletal: Positive for joint pain.       Gait instability  Neurological: Negative.   Psychiatric/Behavioral: Positive for memory loss.  All other systems reviewed and are negative.    PHYSICAL EXAM: VS:  BP (!) 144/74 (BP Location: Left Arm, Patient Position: Sitting, Cuff Size: Normal)   Pulse (!) 50   Ht 5' 10.5" (1.791 m)   Wt 185 lb 6 oz (84.1 kg)   SpO2 99%   BMI 26.22 kg/m  , BMI Body mass index is 26.22 kg/m. Constitutional:  oriented to person, place, and time. No distress.  HENT:  Head:  Grossly normal Eyes:  no discharge. No scleral icterus.  Neck: No JVD, no carotid bruits  Cardiovascular: Regular rate and rhythm, no murmurs appreciated Pulmonary/Chest: Clear to auscultation bilaterally, no wheezes or rails Abdominal: Soft.  no distension.  no tenderness.  Musculoskeletal: Normal range of motion Neurological:  normal muscle tone. Coordination normal. No atrophy Skin: Skin warm and dry Psychiatric: normal affect, pleasant  Recent Labs: 01/16/2020: BUN 28; Creatinine, Ser 1.32; Magnesium 2.5; Potassium 3.9; Sodium 141 01/22/2020: Hemoglobin 14.9; Platelets 161    Lipid Panel Lab Results  Component Value Date   CHOL 122 01/23/2010   HDL 33.8 01/23/2010   LDLCALC 69.6 01/23/2010   TRIG 93 01/23/2010    total chol  Wt Readings from Last 3 Encounters:  02/29/20 185 lb 6 oz (84.1 kg)  01/19/20 184 lb 8 oz (83.7 kg)  01/16/20 192 lb 10.9 oz (87.4 kg)     ASSESSMENT AND PLAN:  Bradycardia Loop in place, results reviewed, no arrhythmia Heart rate 50, not on any rate controlling agents  Hyperlipidemia, unspecified hyperlipidemia type - Cholesterol is at goal on the current lipid regimen. No changes to the medications were made.  HYPERTENSION, BENIGN - Plan: EKG 12-Lead  lisinopril 10 mg daily BP stable No  orthostasis symptoms  Atherosclerosis of native coronary artery of native heart without angina pectoris - Some chest pains at times on working out on the bike Unable to exclude angina, stress test ordered  Syncope, unspecified syncope type - Plan: EKG 12-Lead Episode of syncope as detailed above, reviewed with him Loop monitor in place Echocardiogram reviewed Stress test ordered  Acute embolic stroke (HCC) Loop monitor in place,no arrhythmia  No atrial fib  Stenosis of right carotid artery Nonobstructive disease, Continue aggressive lipid management  Essential tremor Followed by neurology Not able to tolerate beta-blockers given  bradycardia   Total encounter time more than 25 minutes  Greater than 50% was spent in counseling and coordination of care with the patient  Disposition:   F/U  12 months   Orders Placed This Encounter  Procedures  . EKG 12-Lead     Signed, Esmond Plants, M.D., Ph.D. 02/29/2020  [place], [place]

## 2020-02-29 ENCOUNTER — Ambulatory Visit (INDEPENDENT_AMBULATORY_CARE_PROVIDER_SITE_OTHER): Payer: Medicare Other | Admitting: Cardiovascular Disease

## 2020-02-29 ENCOUNTER — Encounter: Payer: Self-pay | Admitting: Cardiovascular Disease

## 2020-02-29 ENCOUNTER — Other Ambulatory Visit: Payer: Self-pay

## 2020-02-29 ENCOUNTER — Other Ambulatory Visit: Payer: Self-pay | Admitting: Cardiovascular Disease

## 2020-02-29 VITALS — BP 144/74 | HR 50 | Ht 70.5 in | Wt 185.4 lb

## 2020-02-29 DIAGNOSIS — E782 Mixed hyperlipidemia: Secondary | ICD-10-CM

## 2020-02-29 DIAGNOSIS — I25118 Atherosclerotic heart disease of native coronary artery with other forms of angina pectoris: Secondary | ICD-10-CM | POA: Diagnosis not present

## 2020-02-29 DIAGNOSIS — Z8673 Personal history of transient ischemic attack (TIA), and cerebral infarction without residual deficits: Secondary | ICD-10-CM

## 2020-02-29 DIAGNOSIS — R5383 Other fatigue: Secondary | ICD-10-CM

## 2020-02-29 DIAGNOSIS — R001 Bradycardia, unspecified: Secondary | ICD-10-CM

## 2020-02-29 DIAGNOSIS — R55 Syncope and collapse: Secondary | ICD-10-CM

## 2020-02-29 DIAGNOSIS — E785 Hyperlipidemia, unspecified: Secondary | ICD-10-CM

## 2020-02-29 DIAGNOSIS — R079 Chest pain, unspecified: Secondary | ICD-10-CM

## 2020-02-29 DIAGNOSIS — I6521 Occlusion and stenosis of right carotid artery: Secondary | ICD-10-CM | POA: Diagnosis not present

## 2020-02-29 NOTE — Patient Instructions (Addendum)
Medication Instructions:  No changes  If you need a refill on your cardiac medications before your next appointment, please call your pharmacy.    Lab work: Free testosterone today, for weakness, arthritis   If you have labs (blood work) drawn today and your tests are completely normal, you will receive your results only by: Marland Kitchen MyChart Message (if you have MyChart) OR . A paper copy in the mail If you have any lab test that is abnormal or we need to change your treatment, we will call you to review the results.   Testing/Procedures: lexiscan myoview for CAD, chest pain/angina [facility] MYOVIEW  Your caregiver has ordered a Stress Test with nuclear imaging. The purpose of this test is to evaluate the blood supply to your heart muscle. This procedure is referred to as a "Non-Invasive Stress Test." This is because other than having an IV started in your vein, nothing is inserted or "invades" your body. Cardiac stress tests are done to find areas of poor blood flow to the heart by determining the extent of coronary artery disease (CAD). Some patients exercise on a treadmill, which naturally increases the blood flow to your heart, while others who are  unable to walk on a treadmill due to physical limitations have a pharmacologic/chemical stress agent called Lexiscan . This medicine will mimic walking on a treadmill by temporarily increasing your coronary blood flow.   Please note: these test may take anywhere between 2-4 hours to complete  PLEASE REPORT TO [facility] MEDICAL MALL ENTRANCE  THE VOLUNTEERS AT THE FIRST DESK WILL DIRECT YOU WHERE TO GO  Date of Procedure:_____________________________________  Arrival Time for Procedure:______________________________    PLEASE NOTIFY THE OFFICE AT LEAST 24 HOURS IN ADVANCE IF YOU ARE UNABLE TO KEEP YOUR APPOINTMENT.  [phone] AND  PLEASE NOTIFY NUCLEAR MEDICINE AT [facility] AT LEAST 24 HOURS IN ADVANCE IF YOU ARE UNABLE TO KEEP YOUR APPOINTMENT.  [phone]  How to prepare for your Myoview test:  1. Do not eat or drink after midnight 2. No caffeine for 24 hours prior to test 3. No smoking 24 hours prior to test. 4. Your medication may be taken with water.  If your doctor stopped a medication because of this test, do not take that medication. 5. Ladies, please do not wear dresses.  Skirts or pants are appropriate. Please wear a short sleeve shirt. 6. No perfume, cologne or lotion. 7. Wear comfortable walking shoes. No heels!    Follow-Up: At [facility], you and your health needs are our priority.  As part of our continuing mission to provide you with exceptional heart care, we have created designated Provider Care Teams.  These Care Teams include your primary Cardiologist (physician) and Advanced Practice Providers (APPs -  Physician Assistants and Nurse Practitioners) who all work together to provide you with the care you need, when you need it.  . You will need a follow up appointment in 12 months   . Providers on your designated Care Team:   . Nicolasa Ducking, NP . Eula Listen, PA-C . Marisue Ivan, PA-C  Any Other Special Instructions Will Be Listed Below (If Applicable).  For educational health videos Log in to : [email or web address] Or : [email or web address], password : [email or web address]

## 2020-03-03 LAB — TESTOSTERONE, FREE: Testosterone, Free: 3.4 pg/mL — ABNORMAL LOW (ref 6.6–18.1)

## 2020-03-04 ENCOUNTER — Encounter
Admission: RE | Admit: 2020-03-04 | Discharge: 2020-03-04 | Disposition: A | Discharge status: Home / Self Care | Payer: Medicare Other | Source: Ambulatory Visit | Attending: Cardiovascular Disease | Admitting: Cardiovascular Disease

## 2020-03-04 ENCOUNTER — Other Ambulatory Visit: Payer: Self-pay

## 2020-03-04 DIAGNOSIS — R079 Chest pain, unspecified: Secondary | ICD-10-CM | POA: Diagnosis present

## 2020-03-04 LAB — NM MYOCAR MULTI W/SPECT W/WALL MOTION / EF
LV dias vol: 90 mL (ref 62–150)
LV sys vol: 31 mL
Peak HR: 67 {beats}/min
Percent HR: 49 %
Rest HR: 48 {beats}/min
SDS: 1
SRS: 6
SSS: 2
TID: 0.92

## 2020-03-04 MED ORDER — REGADENOSON 0.4 MG/5ML IV SOLN
0.4000 mg | Freq: Once | INTRAVENOUS | Status: AC
  Administered 2020-03-04: 0.4 mg via INTRAVENOUS
  Filled 2020-03-04: qty 5

## 2020-03-04 MED ORDER — TECHNETIUM TC 99M TETROFOSMIN IV KIT
32.8000 | PACK | Freq: Once | INTRAVENOUS | Status: AC | PRN
  Administered 2020-03-04: 32.8 via INTRAVENOUS

## 2020-03-04 MED ORDER — TECHNETIUM TC 99M TETROFOSMIN IV KIT
10.0000 | PACK | Freq: Once | INTRAVENOUS | Status: AC | PRN
  Administered 2020-03-04: 10.96 via INTRAVENOUS

## 2020-03-07 ENCOUNTER — Ambulatory Visit (INDEPENDENT_AMBULATORY_CARE_PROVIDER_SITE_OTHER): Payer: Medicare Other | Admitting: *Deleted

## 2020-03-07 DIAGNOSIS — R55 Syncope and collapse: Secondary | ICD-10-CM

## 2020-03-07 LAB — CUP PACEART REMOTE DEVICE CHECK
Date Time Interrogation Session: 20210404035417
Implantable Pulse Generator Implant Date: 20180612

## 2020-03-08 NOTE — Progress Notes (Signed)
ILR Remote 

## 2020-04-07 LAB — CUP PACEART REMOTE DEVICE CHECK
Date Time Interrogation Session: 20210505035818
Implantable Pulse Generator Implant Date: 20180612

## 2020-04-11 ENCOUNTER — Ambulatory Visit (INDEPENDENT_AMBULATORY_CARE_PROVIDER_SITE_OTHER): Payer: Medicare Other | Admitting: *Deleted

## 2020-04-11 DIAGNOSIS — I639 Cerebral infarction, unspecified: Secondary | ICD-10-CM

## 2020-04-11 NOTE — Progress Notes (Signed)
[facility] Summary Report / Loop Recorder 

## 2020-05-16 ENCOUNTER — Ambulatory Visit (INDEPENDENT_AMBULATORY_CARE_PROVIDER_SITE_OTHER): Payer: Medicare Other | Admitting: *Deleted

## 2020-05-16 DIAGNOSIS — I639 Cerebral infarction, unspecified: Secondary | ICD-10-CM | POA: Diagnosis not present

## 2020-05-16 LAB — CUP PACEART REMOTE DEVICE CHECK
Date Time Interrogation Session: 20210613232856
Implantable Pulse Generator Implant Date: 20180612

## 2020-05-16 NOTE — Progress Notes (Signed)
[facility] Summary Report / Loop Recorder 

## 2020-06-20 ENCOUNTER — Ambulatory Visit (INDEPENDENT_AMBULATORY_CARE_PROVIDER_SITE_OTHER): Payer: Medicare Other | Admitting: *Deleted

## 2020-06-20 DIAGNOSIS — I639 Cerebral infarction, unspecified: Secondary | ICD-10-CM

## 2020-06-20 LAB — CUP PACEART REMOTE DEVICE CHECK
Date Time Interrogation Session: 20210718230535
Implantable Pulse Generator Implant Date: 20180612

## 2020-06-21 NOTE — Progress Notes (Signed)
[facility] Summary Report / Loop Recorder 

## 2020-07-06 DIAGNOSIS — R7989 Other specified abnormal findings of blood chemistry: Secondary | ICD-10-CM | POA: Insufficient documentation

## 2020-07-25 ENCOUNTER — Ambulatory Visit (INDEPENDENT_AMBULATORY_CARE_PROVIDER_SITE_OTHER): Payer: Medicare Other | Admitting: *Deleted

## 2020-07-25 DIAGNOSIS — I639 Cerebral infarction, unspecified: Secondary | ICD-10-CM

## 2020-07-25 LAB — CUP PACEART REMOTE DEVICE CHECK
Date Time Interrogation Session: 20210820230933
Implantable Pulse Generator Implant Date: 20180612

## 2020-07-28 NOTE — Progress Notes (Signed)
[facility] Summary Report / Loop Recorder 

## 2020-08-28 LAB — CUP PACEART REMOTE DEVICE CHECK
Date Time Interrogation Session: 20210922232848
Implantable Pulse Generator Implant Date: 20180612

## 2020-08-29 ENCOUNTER — Ambulatory Visit (INDEPENDENT_AMBULATORY_CARE_PROVIDER_SITE_OTHER): Payer: Medicare Other | Admitting: Emergency Medicine

## 2020-08-29 DIAGNOSIS — I639 Cerebral infarction, unspecified: Secondary | ICD-10-CM | POA: Diagnosis not present

## 2020-08-31 NOTE — Progress Notes (Signed)
[facility] Summary Report / Loop Recorder 

## 2020-10-01 LAB — CUP PACEART REMOTE DEVICE CHECK
Date Time Interrogation Session: 20211026000042
Implantable Pulse Generator Implant Date: 20180612

## 2020-10-03 ENCOUNTER — Ambulatory Visit (INDEPENDENT_AMBULATORY_CARE_PROVIDER_SITE_OTHER): Payer: Medicare Other

## 2020-10-03 DIAGNOSIS — I639 Cerebral infarction, unspecified: Secondary | ICD-10-CM | POA: Diagnosis not present

## 2020-10-05 NOTE — Progress Notes (Signed)
[facility] Summary Report / Loop Recorder 

## 2020-10-06 ENCOUNTER — Other Ambulatory Visit (HOSPITAL_COMMUNITY): Payer: Self-pay | Admitting: Internal Medicine

## 2020-10-06 ENCOUNTER — Other Ambulatory Visit: Payer: Self-pay | Admitting: Internal Medicine

## 2020-10-06 DIAGNOSIS — G8929 Other chronic pain: Secondary | ICD-10-CM

## 2020-10-06 DIAGNOSIS — M545 Low back pain, unspecified: Secondary | ICD-10-CM

## 2020-10-18 ENCOUNTER — Telehealth: Payer: Self-pay | Admitting: Internal Medicine

## 2020-10-18 NOTE — Telephone Encounter (Signed)
Wife of the patient called. The patient is scheduled to have an MRI at [facility] on 11/10/20. The wife wanted to know if there was anything he needed to do about the implanted loop recorder.

## 2020-10-18 NOTE — Telephone Encounter (Signed)
Called to advise there are no restrictions with ILR and MRI. Verbalized understanding.

## 2020-11-04 LAB — CUP PACEART REMOTE DEVICE CHECK
Date Time Interrogation Session: 20211127231239
Implantable Pulse Generator Implant Date: 20180612

## 2020-11-07 ENCOUNTER — Ambulatory Visit: Payer: Medicare Other

## 2020-11-07 ENCOUNTER — Ambulatory Visit (INDEPENDENT_AMBULATORY_CARE_PROVIDER_SITE_OTHER): Payer: Medicare Other

## 2020-11-07 DIAGNOSIS — I639 Cerebral infarction, unspecified: Secondary | ICD-10-CM

## 2020-11-10 ENCOUNTER — Other Ambulatory Visit: Payer: Self-pay

## 2020-11-10 ENCOUNTER — Ambulatory Visit (HOSPITAL_COMMUNITY)
Admission: RE | Admit: 2020-11-10 | Discharge: 2020-11-10 | Disposition: A | Discharge status: Home / Self Care | Payer: Medicare Other | Source: Ambulatory Visit | Attending: Internal Medicine | Admitting: Internal Medicine

## 2020-11-10 DIAGNOSIS — G8929 Other chronic pain: Secondary | ICD-10-CM

## 2020-11-10 DIAGNOSIS — M545 Low back pain, unspecified: Secondary | ICD-10-CM | POA: Diagnosis present

## 2020-11-16 NOTE — Progress Notes (Signed)
[facility] Summary Report / Loop Recorder 

## 2020-12-11 LAB — CUP PACEART REMOTE DEVICE CHECK
Date Time Interrogation Session: 20220108231015
Implantable Pulse Generator Implant Date: 20180612

## 2020-12-12 ENCOUNTER — Ambulatory Visit (INDEPENDENT_AMBULATORY_CARE_PROVIDER_SITE_OTHER): Payer: Medicare Other

## 2020-12-12 DIAGNOSIS — I639 Cerebral infarction, unspecified: Secondary | ICD-10-CM

## 2020-12-25 NOTE — Progress Notes (Signed)
[facility] Summary Report / Loop Recorder 

## 2021-01-14 LAB — CUP PACEART REMOTE DEVICE CHECK
Date Time Interrogation Session: 20220210232922
Implantable Pulse Generator Implant Date: 20180612

## 2021-01-16 ENCOUNTER — Ambulatory Visit (INDEPENDENT_AMBULATORY_CARE_PROVIDER_SITE_OTHER): Payer: Medicare Other

## 2021-01-16 DIAGNOSIS — I639 Cerebral infarction, unspecified: Secondary | ICD-10-CM

## 2021-01-19 NOTE — Progress Notes (Signed)
[facility] Summary Report / Loop Recorder 

## 2021-01-30 ENCOUNTER — Telehealth: Payer: Self-pay

## 2021-01-30 NOTE — Telephone Encounter (Signed)
ILR has reached RRT as of 01/28/21. Spoke to patient and he request we call and speak to his wife about explant or leave device in. Address on file verified. Marked "I" in [employer] and all remote checks canceled. Patient aware how to send return kit back. Attempted to call wife Sand (DPR), no answer, LMOVM.

## 2021-02-07 NOTE — Telephone Encounter (Signed)
Called and spoke to patients wife Dois Davenport. Wife states patient has apt. With general cardiology next week. States she would like to talk with him about if he wants the device replaced or leave in. Advised to call back when they have a decision made. Verbalized understanding.

## 2021-02-20 NOTE — Progress Notes (Signed)
Cardiology Office Note  Date:  02/21/2021   ID:  Calvin Broadus., DOB 1933/11/04, MRN [id number]  PCP:  [name], MD   Chief Complaint  Patient presents with  . Follow-up    Annual follow up. Medications verbally reviewed with patient and daughter.    HPI:  [name] is a 87 year-old gentleman with a history of  coronary artery disease,   stent placed to his proximal RCA in September 2007, taxis stent 3.0 x 24 mm, previous syncopal episode (Etiology of his spell is uncertain),  50% carotid disease on the right CVA 2018, History of peripheral vision issues  who presents for routine followup of his coronary artery disease,  Tremor, and stroke  Prior history of left parieto-occipital hemorrhagic stroke Recent concern, seen by ophthamologist 3/22 who did HVF test which showed right inf>sup quandrantopsia. felt this was a new finding [name] denied any new visual field deficits.  He has been driving [place]en in the [place] 5/37/4827 Head MRI: nothing acute  Had follow-up with neurology, no further testing  Prior loop monitor downloads reviewed, no arrhythmia noted Loop battery depleted  In good spirits, stable gait Walks 2x a day 1-2 miles  Active at baseline No near syncope, bikes, works in yard  Wife presents with him today, supports the notion that he is stable Cholesterol at goal, non-smoker, no diabetes  EKG personally reviewed by myself on todays visit Shows normal sinus rhythm rate 53 bpm poor R wave progression, T wave abnormality inferior leads, likely secondary to Foley placement lower extremity limb leads (discussed with patient, leg limb leads placed on lower torso not on the legs as he was wearing high socks, tight jeans) likely accounting for difference in EKG  Past medical history reviewed 01/16/2020,  ED for syncope 3 alcoholic beverages that night, which was more than his usual  woke around 2 AM, and after entering the kitchen, had a syncopal  episode  --received his second COVID-19 vaccination 4 days prior. usually spends some time on the edge of the bed and seated before standing, in order to prevent dizziness  daughter reports the fall / LOC woke her from sleep She found him slumped over on the floor.  he was cold to the touch and unable to speak  blood pressure was low.  EMS took his BP when they arrived and noted it was low.  MRA was without acute abnormality and showed mild chronic small vessel ischemic dz and moderate cerebral atrophy. chronic hemorrhagic L temporoparietal infarct.    Echo 1. Left ventricular ejection fraction, by estimation, is 55%.   fall while in [place] 2019 Was watching a football game, stood up and had a mechanical fall tripped over some shoes 10/24/2018 Suffered a hip fracture , was hospitalized  Following the surgery the physicians increased his lisinopril up to 20 mg daily and placed him on aspirin 325 mg daily He has started to have some upset stomach, wonders if it could be the high-dose aspirin  February 2018 he had acute vision changes, word finding difficulty. He was taken by life flight to [facility] hospital MRI/MRA showing acute stroke Details of scan as below: 1. Acute left parieto-occipital intraparenchymal hematoma with adjacent subarachnoid hemorrhage. This most likely represents hemorrhagic transformation from an embolic infarct given its configuration. No underlying mass. No other findings to suggest amyloid angiopathy. 2. Normal MRA of the head.  He had a CT scan of the neck showing 50% stenosis on the left,  10% on the right  At the time of his stroke he was on aspirin and Plavix. Plavix was held  echocardiogram  NORMAL LEFT VENTRICULAR SYSTOLIC FUNCTION WITH MILD LVH NORMAL RIGHT VENTRICULAR SYSTOLIC FUNCTION VALVULAR REGURGITATION: TRIVIAL AR, TRIVIAL TR VALVULAR STENOSIS: TRIVIAL AS VERY POOR SOUND TRANSMISSION-DEFINITY CONTRAST USED NO PRIOR STUDY FOR COMPARISON       PMH:   has a past medical history of Acute embolic stroke (HCC) (02/01/2017), BPH (benign prostatic hyperplasia), Bradycardia (11/10/2015), Coronary artery disease, Dizziness (08/21/2012), DYSPEPSIA (07/31/2010), Hyperlipidemia (05/16/2010), Hypertension, HYPERTENSION, BENIGN (05/16/2010), and Stenosis of right carotid artery (02/01/2017).  PSH:    Past Surgical History:  Procedure Laterality Date  . HIP SURGERY     right hip   . Intestinal Blockage    . LOOP RECORDER INSERTION N/A 05/14/2017   Procedure: Loop Recorder Insertion;  Surgeon: Duke Salvia, MD;  Location: [facility] INVASIVE CV LAB;  Service: Cardiovascular;  Laterality: N/A;  . SKIN CANCER EXCISION     x2  . TONSILLECTOMY      Current Outpatient Medications  Medication Sig Dispense Refill  . acetaminophen (TYLENOL) 650 MG CR tablet Take 650 mg by mouth every 8 (eight) hours as needed.     Marland Kitchen aspirin 81 MG tablet Take 1 tablet (81 mg total) by mouth daily. 30 tablet   . b complex vitamins tablet Take 1 tablet by mouth daily.    . cholecalciferol (VITAMIN D) 1000 units tablet Take 2,000 Units by mouth daily.    Marland Kitchen ezetimibe (ZETIA) 10 MG tablet Take 1 tablet (10 mg total) by mouth daily. 90 tablet 3  . FLAXSEED, LINSEED, PO Take 2 g by mouth daily.    Marland Kitchen lisinopril (PRINIVIL,ZESTRIL) 10 MG tablet Take 1 tablet (10 mg total) by mouth daily. 90 tablet 3  . nitroGLYCERIN (NITROSTAT) 0.4 MG SL tablet Place 1 tablet (0.4 mg total) under the tongue every 5 (five) minutes as needed for chest pain. 25 tablet 3  . pregabalin (LYRICA) 150 MG capsule Take 150 mg by mouth 2 (two) times daily.     . simvastatin (ZOCOR) 40 MG tablet Take 40 mg by mouth every evening.     . tamsulosin (FLOMAX) 0.4 MG CAPS capsule Take 2 capsules (0.8 mg total) by mouth daily. 30 capsule 0   No current facility-administered medications for this visit.     Allergies:   Codeine and Penicillins   Social History:  The patient  reports that he has never smoked.  He has never used smokeless tobacco. He reports current alcohol use. He reports that he does not use drugs.   Family History:   family history includes Cancer in an other family member; Hyperlipidemia in an other family member; Hypertension in his mother and another family member; Other in an other family member.    Review of Systems: Review of Systems  Constitutional: Negative.   HENT: Negative.   Eyes:       Vision deficits  Respiratory: Negative.   Cardiovascular: Negative.   Gastrointestinal: Negative.   Musculoskeletal: Positive for joint pain.       Gait instability  Neurological: Negative.   Psychiatric/Behavioral: Positive for memory loss.  All other systems reviewed and are negative.    PHYSICAL EXAM: VS:  BP 124/62 (BP Location: Left Arm, Patient Position: Sitting, Cuff Size: Normal)   Pulse (!) 53   Ht 5' 10.5" (1.791 m)   Wt 184 lb (83.5 kg)   SpO2 98%   BMI 26.03  kg/m  , BMI Body mass index is 26.03 kg/m. Constitutional:  oriented to person, place, and time. No distress.  HENT:  Head: Grossly normal Eyes:  no discharge. No scleral icterus.  Neck: No JVD, no carotid bruits  Cardiovascular: Regular rate and rhythm, no murmurs appreciated Pulmonary/Chest: Clear to auscultation bilaterally, no wheezes or rails Abdominal: Soft.  no distension.  no tenderness.  Musculoskeletal: Normal range of motion Neurological:  normal muscle tone. Coordination normal. No atrophy Skin: Skin warm and dry Psychiatric: normal affect, pleasant   Recent Labs: No results found for requested labs within last 8760 hours.    Lipid Panel Lab Results  Component Value Date   CHOL 122 01/23/2010   HDL 33.8 01/23/2010   LDLCALC 69.6 01/23/2010   TRIG 93 01/23/2010    total chol  Wt Readings from Last 3 Encounters:  02/21/21 184 lb (83.5 kg)  02/29/20 185 lb 6 oz (84.1 kg)  01/19/20 184 lb 8 oz (83.7 kg)     ASSESSMENT AND PLAN:  Bradycardia Loop in place, results  reviewed, no arrhythmia Heart rate 50, asymptomatic No further work-up  Hyperlipidemia, unspecified hyperlipidemia type - Cholesterol is at goal on the current lipid regimen. No changes to the medications were made.  HYPERTENSION, BENIGN - Plan: EKG 12-Lead Blood pressure is well controlled on today's visit. No changes made to the medications.  Atherosclerosis of native coronary artery of native heart with chronic stable angina pectoris - Denies anginal symptoms, No further work-up needed   syncope, unspecified syncope type - Plan: EKG 12-Lead Remote syncope No recent episodes near syncope or syncope, blood pressure stable No further work-up needed  Acute embolic stroke (HCC) Loop monitor in place,no arrhythmia in the past 3 years No atrial fib  Stenosis of right carotid artery Nonobstructive disease, Cholesterol at goal  Essential tremor Followed by neurology Not able to tolerate beta-blockers given bradycardia   Total encounter time more than 25 minutes  Greater than 50% was spent in counseling and coordination of care with the patient     Orders Placed This Encounter  Procedures  . EKG 12-Lead     Signed, [name] Arbour, M.D., Ph.D. 02/21/2021  [facility] [place], [place] [phone]

## 2021-02-21 ENCOUNTER — Other Ambulatory Visit: Payer: Self-pay

## 2021-02-21 ENCOUNTER — Ambulatory Visit (INDEPENDENT_AMBULATORY_CARE_PROVIDER_SITE_OTHER): Payer: Medicare Other | Admitting: Cardiovascular Disease

## 2021-02-21 ENCOUNTER — Encounter: Payer: Self-pay | Admitting: Cardiovascular Disease

## 2021-02-21 VITALS — BP 124/62 | HR 53 | Ht 70.5 in | Wt 184.0 lb

## 2021-02-21 DIAGNOSIS — I639 Cerebral infarction, unspecified: Secondary | ICD-10-CM | POA: Diagnosis not present

## 2021-02-21 DIAGNOSIS — R55 Syncope and collapse: Secondary | ICD-10-CM | POA: Diagnosis not present

## 2021-02-21 DIAGNOSIS — R001 Bradycardia, unspecified: Secondary | ICD-10-CM | POA: Diagnosis not present

## 2021-02-21 DIAGNOSIS — E785 Hyperlipidemia, unspecified: Secondary | ICD-10-CM

## 2021-02-21 DIAGNOSIS — I25118 Atherosclerotic heart disease of native coronary artery with other forms of angina pectoris: Secondary | ICD-10-CM | POA: Diagnosis not present

## 2021-02-21 DIAGNOSIS — R079 Chest pain, unspecified: Secondary | ICD-10-CM

## 2021-02-21 NOTE — Patient Instructions (Signed)
Medication Instructions:  No changes  If you need a refill on your cardiac medications before your next appointment, please call your pharmacy.    Lab work: No new labs needed   If you have labs (blood work) drawn today and your tests are completely normal, you will receive your results only by: . MyChart Message (if you have MyChart) OR . A paper copy in the mail If you have any lab test that is abnormal or we need to change your treatment, we will call you to review the results.   Testing/Procedures: No new testing needed   Follow-Up: At [facility], you and your health needs are our priority.  As part of our continuing mission to provide you with exceptional heart care, we have created designated Provider Care Teams.  These Care Teams include your primary Cardiologist (physician) and Advanced Practice Providers (APPs -  Physician Assistants and Nurse Practitioners) who all work together to provide you with the care you need, when you need it.  . You will need a follow up appointment in 12 months  . Providers on your designated Care Team:   . Christopher Berge, NP . Ryan Dunn, PA-C . Jacquelyn Visser, PA-C  Any Other Special Instructions Will Be Listed Below (If Applicable).  COVID-19 Vaccine Information can be found at: [email or web address] For questions related to vaccine distribution or appointments, please email [email or web address] or call [phone].     

## 2021-03-31 ENCOUNTER — Other Ambulatory Visit: Payer: Self-pay | Admitting: Cardiovascular Disease

## 2021-12-11 ENCOUNTER — Other Ambulatory Visit: Payer: Self-pay | Admitting: Internal Medicine

## 2021-12-11 DIAGNOSIS — R519 Headache, unspecified: Secondary | ICD-10-CM

## 2021-12-11 DIAGNOSIS — H539 Unspecified visual disturbance: Secondary | ICD-10-CM

## 2021-12-13 ENCOUNTER — Ambulatory Visit
Admission: RE | Admit: 2021-12-13 | Discharge: 2021-12-13 | Disposition: A | Discharge status: Home / Self Care | Payer: Medicare Other | Source: Ambulatory Visit | Attending: Internal Medicine | Admitting: Internal Medicine

## 2021-12-13 DIAGNOSIS — R519 Headache, unspecified: Secondary | ICD-10-CM | POA: Diagnosis not present

## 2021-12-13 DIAGNOSIS — H539 Unspecified visual disturbance: Secondary | ICD-10-CM | POA: Insufficient documentation

## 2022-01-13 ENCOUNTER — Other Ambulatory Visit: Payer: Self-pay | Admitting: Cardiovascular Disease

## 2022-02-20 ENCOUNTER — Ambulatory Visit (INDEPENDENT_AMBULATORY_CARE_PROVIDER_SITE_OTHER): Payer: Medicare Other | Admitting: Cardiovascular Disease

## 2022-02-20 ENCOUNTER — Encounter: Payer: Self-pay | Admitting: Cardiovascular Disease

## 2022-02-20 ENCOUNTER — Other Ambulatory Visit: Payer: Self-pay

## 2022-02-20 ENCOUNTER — Ambulatory Visit (INDEPENDENT_AMBULATORY_CARE_PROVIDER_SITE_OTHER): Payer: Medicare Other

## 2022-02-20 VITALS — BP 130/60 | HR 60 | Ht 68.0 in | Wt 182.1 lb

## 2022-02-20 DIAGNOSIS — I639 Cerebral infarction, unspecified: Secondary | ICD-10-CM

## 2022-02-20 DIAGNOSIS — I25118 Atherosclerotic heart disease of native coronary artery with other forms of angina pectoris: Secondary | ICD-10-CM

## 2022-02-20 DIAGNOSIS — R55 Syncope and collapse: Secondary | ICD-10-CM

## 2022-02-20 DIAGNOSIS — E785 Hyperlipidemia, unspecified: Secondary | ICD-10-CM

## 2022-02-20 DIAGNOSIS — R002 Palpitations: Secondary | ICD-10-CM

## 2022-02-20 DIAGNOSIS — Z8673 Personal history of transient ischemic attack (TIA), and cerebral infarction without residual deficits: Secondary | ICD-10-CM

## 2022-02-20 DIAGNOSIS — I10 Essential (primary) hypertension: Secondary | ICD-10-CM

## 2022-02-20 NOTE — Progress Notes (Signed)
Cardiology Office Note ? ?Date:  02/20/2022  ? ?ID:  [name]., DOB [birth year], MRN [id number] ? ?PCP:  Idelle Crouch, MD  ? ?Chief Complaint  ?Patient presents with  ? 12 month follow up   ?  "Doing well."  Medications  reviewed by the patient verbally.   ? ? ?HPI:  ?[name] is a 88 year-old gentleman with a history of  ?coronary artery disease,   ?stent placed to his proximal RCA in September 2007, taxis stent 3.0 x 24 mm, ?previous syncopal episode (Etiology of his spell is uncertain),  ?[id number] carotid disease on the right ?CVA 2018, History of peripheral vision issues  ?who presents for routine followup of his coronary artery disease,  Tremor, and stroke ? ?Last seen in clinic March 2022 ? ?In follow-up today reports he continues to do regular exercise ?He has appreciated  "pounding in chest with exercise", etiology unclear ?Has some associated lightheaded with working out, not dizziness ?Slows down when he has symptoms entirely resolved ?Has not appreciated tachycardia on his exercise watch ? ?Denies chest pain on exertion concerning for angina ?Otherwise feels well, sleeping well, has slowed down since the stroke ?No longer does real state ? ?Denies any progression of his neurologic disease ? ?Loop battery depleted ? ?Lab work reviewed ?Total chol 110 ?A1C 5.7 ? ?EKG personally reviewed by myself on todays visit ?Normal sinus rhythm rate 60 bpm poor R wave progression to the anterior precordial leads, consider old inferior MI ? ?past medical history reviewed ?history of left parieto-occipital hemorrhagic stroke ? seen by ophthamologist 3/22 who did HVF test which showed right inf>sup quandrantopsia. felt this was a new finding ?[name] denied any new visual field deficits.  He has been driving ?Seen in [facility] 02/14/2021 Head MRI: nothing acute ? ?01/16/2020,  ED for syncope ?3 alcoholic beverages that night, which was more than his usual ? woke around 2 AM, and after entering the kitchen, had  a syncopal episode  ?--received his second COVID-19 vaccination 4 days prior. ?usually spends some time on the edge of the bed and seated before standing, in order to prevent dizziness ? ?daughter reports the fall / LOC woke her from sleep ?She found him slumped over on the floor.  ?he was cold to the touch and unable to speak  ?blood pressure was low.  ?EMS took his BP when they arrived and noted it was low. ? ?MRA was without acute abnormality and showed mild chronic small vessel ischemic dz and moderate cerebral atrophy. chronic hemorrhagic L temporoparietal infarct.   ? ?Echo ?1. Left ventricular ejection fraction, by estimation, is 55%. ? ? fall while in [place] ?Was watching a football game, stood up and had a mechanical fall tripped over some shoes 10/24/2018 ?Suffered a hip fracture , was hospitalized  ?Following the surgery the physicians increased his lisinopril up to 20 mg daily and placed him on aspirin 325 mg daily ?He has started to have some upset stomach, wonders if it could be the high-dose aspirin ? ?February 2018 he had acute vision changes, word finding difficulty. ?He was taken by life flight to [facility] hospital ?MRI/MRA showing acute stroke ?Details of scan as below: ?1. Acute left parieto-occipital intraparenchymal hematoma with adjacent subarachnoid hemorrhage. This most likely represents hemorrhagic transformation from an embolic infarct given its configuration. No underlying mass. No other findings to suggest amyloid angiopathy. ?2. Normal MRA of the head. ? ?He had a CT scan of the  neck showing 50% stenosis on the left, 10% on the right ? ?At the time of his stroke he was on aspirin and Plavix. Plavix was held ? echocardiogram  ?NORMAL LEFT VENTRICULAR SYSTOLIC FUNCTION WITH MILD LVH ?NORMAL RIGHT VENTRICULAR SYSTOLIC FUNCTION ?VALVULAR REGURGITATION: TRIVIAL AR, TRIVIAL TR ?VALVULAR STENOSIS: TRIVIAL AS ?VERY POOR SOUND TRANSMISSION-DEFINITY CONTRAST USED ?NO PRIOR STUDY FOR COMPARISON   ? ? ?PMH:   has a past medical history of Acute embolic stroke (HCC) (02/01/2017), BPH (benign prostatic hyperplasia), Bradycardia (11/10/2015), Coronary artery disease, Dizziness (08/21/2012), DYSPEPSIA (07/31/2010), Hyperlipidemia (05/16/2010), Hypertension, HYPERTENSION, BENIGN (05/16/2010), and Stenosis of right carotid artery (02/01/2017). ? ?PSH:    ?Past Surgical History:  ?Procedure Laterality Date  ? HIP SURGERY    ? right hip   ? Intestinal Blockage    ? LOOP RECORDER INSERTION N/A 05/14/2017  ? Procedure: Loop Recorder Insertion;  Surgeon: Duke Salvia, MD;  Location: [facility] INVASIVE CV LAB;  Service: Cardiovascular;  Laterality: N/A;  ? SKIN CANCER EXCISION    ? x2  ? TONSILLECTOMY    ? ? ?Current Outpatient Medications  ?Medication Sig Dispense Refill  ? acetaminophen (TYLENOL) 650 MG CR tablet Take 650 mg by mouth every 8 (eight) hours as needed.     ? aspirin 81 MG tablet Take 1 tablet (81 mg total) by mouth daily. 30 tablet   ? b complex vitamins tablet Take 1 tablet by mouth daily.    ? cholecalciferol (VITAMIN D) 1000 units tablet Take 2,000 Units by mouth daily.    ? ezetimibe (ZETIA) 10 MG tablet Take 1 tablet (10 mg total) by mouth daily. 90 tablet 0  ? FLAXSEED, LINSEED, PO Take 2 g by mouth daily.    ? lisinopril (PRINIVIL,ZESTRIL) 10 MG tablet Take 1 tablet (10 mg total) by mouth daily. 90 tablet 3  ? nitroGLYCERIN (NITROSTAT) 0.4 MG SL tablet Place 1 tablet (0.4 mg total) under the tongue every 5 (five) minutes as needed for chest pain. 25 tablet 3  ? pregabalin (LYRICA) 150 MG capsule Take 150 mg in the am & 100 mg in the pm    ? simvastatin (ZOCOR) 40 MG tablet Take 40 mg by mouth every evening.     ? tamsulosin (FLOMAX) 0.4 MG CAPS capsule Take 2 capsules (0.8 mg total) by mouth daily. 30 capsule 0  ? ?No current facility-administered medications for this visit.  ? ? ? ?Allergies:   Codeine and Penicillins  ? ?Social History:  The patient  reports that he has never smoked. He has never used  smokeless tobacco. He reports current alcohol use. He reports that he does not use drugs.  ? ?Family History:   family history includes Cancer in an other family member; Hyperlipidemia in an other family member; Hypertension in his mother and another family member; Other in an other family member.  ? ? ?Review of Systems: ?Review of Systems  ?Constitutional: Negative.   ?HENT: Negative.    ?Eyes:   ?     Vision deficits  ?Respiratory: Negative.    ?Cardiovascular:  Positive for palpitations.  ?Gastrointestinal: Negative.   ?Musculoskeletal:  Positive for joint pain.  ?     Gait instability  ?Neurological: Negative.   ?Psychiatric/Behavioral:  Positive for memory loss.   ?All other systems reviewed and are negative. ? ? ?PHYSICAL EXAM: ?VS:  BP 130/60 (BP Location: Left Arm, Patient Position: Sitting, Cuff Size: Normal)   Pulse 60   Ht 5\' 8"  (1.727 m)  Wt 182 lb 2 oz (82.6 kg)   SpO2 98%   BMI 27.69 kg/m?  , BMI Body mass index is 27.69 kg/m?Marland Kitchen ?Constitutional:  oriented to person, place, and time. No distress.  ?HENT:  ?Head: Grossly normal ?Eyes:  no discharge. No scleral icterus.  ?Neck: No JVD, no carotid bruits  ?Cardiovascular: Regular rate and rhythm, no murmurs appreciated ?Pulmonary/Chest: Clear to auscultation bilaterally, no wheezes or rails ?Abdominal: Soft.  no distension.  no tenderness.  ?Musculoskeletal: Normal range of motion ?Neurological:  normal muscle tone. Coordination normal. No atrophy ?Skin: Skin warm and dry ?Psychiatric: normal affect, pleasant ? ? ?Recent Labs: ?No results found for requested labs within last 8760 hours.  ? ? ?Lipid Panel ?Lab Results  ?Component Value Date  ? CHOL 122 01/23/2010  ? HDL 33.8 01/23/2010  ? LDLCALC 69.6 01/23/2010  ? TRIG 93 01/23/2010  ? ? total chol ? ?Wt Readings from Last 3 Encounters:  ?02/20/22 182 lb 2 oz (82.6 kg)  ?02/21/21 184 lb (83.5 kg)  ?02/29/20 185 lb 6 oz (84.1 kg)  ?  ? ?ASSESSMENT AND  PLAN: ? ?Bradycardia ?Asymptomatic ? ?Palpitations ?Reports a pounding and dizziness when he exercises, unable to exclude arrhythmia ?Zio monitor has been ordered ? ?Hyperlipidemia, unspecified hyperlipidemia type - ?Cholesterol is at goal on the current lipid

## 2022-02-20 NOTE — Patient Instructions (Addendum)
Medication Instructions:  ?No changes ? ?If you need a refill on your cardiac medications before your next appointment, please call your pharmacy.  ? ?Lab work: ?No new labs needed ? ?Testing/Procedures: ?Heart monitor (Zio patch) for 2 weeks (14 days)  ? ?Your physician has recommended that you wear a Zio monitor. This monitor is a medical device that records the heart?s electrical activity. Doctors most often use these monitors to diagnose arrhythmias. Arrhythmias are problems with the speed or rhythm of the heartbeat. The monitor is a small device applied to your chest. You can wear one while you do your normal daily activities. While wearing this monitor if you have any symptoms to push the button and record what you felt. Once you have worn this monitor for the period of time provider prescribed (Usually 14 days), you will return the monitor device in the postage paid box. Once it is returned they will download the data collected and provide [place] with a report which the provider will then review and we will call you with those results. Important tips: ? ?Avoid showering during the first 24 hours of wearing the monitor. ?Avoid excessive sweating to help maximize wear time. ?Do not submerge the device, no hot tubs, and no swimming pools. ?Keep any lotions or oils away from the patch. ?After 24 hours you may shower with the patch on. Take brief showers with your back facing the shower head.  ?Do not remove patch once it has been placed because that will interrupt data and decrease adhesive wear time. ?Push the button when you have any symptoms and write down what you were feeling. ?Once you have completed wearing your monitor, remove and place into box which has postage paid and place in your outgoing mailbox.  ?If for some reason you have misplaced your box then call our office and we can provide another box and/or mail it off for you. ? ? ? ? ?Follow-Up: ?At [facility], you and your health needs are our  priority.  As part of our continuing mission to provide you with exceptional heart care, we have created designated Provider Care Teams.  These Care Teams include your primary Cardiologist (physician) and Advanced Practice Providers (APPs -  Physician Assistants and Nurse Practitioners) who all work together to provide you with the care you need, when you need it. ? ?You will need a follow up appointment in 6 months ? ?Providers on your designated Care Team:   ?Murray Hodgkins, NP ?Christell Faith, PA-C ?Cadence Kathlen Mody, PA-C ? ?COVID-19 Vaccine Information can be found at: [email or web address] For questions related to vaccine distribution or appointments, please email [email or web address] or call [phone].  ? ?

## 2022-02-23 ENCOUNTER — Other Ambulatory Visit: Payer: Self-pay | Admitting: Internal Medicine

## 2022-02-23 ENCOUNTER — Other Ambulatory Visit (HOSPITAL_COMMUNITY): Payer: Self-pay | Admitting: Internal Medicine

## 2022-02-23 ENCOUNTER — Other Ambulatory Visit (HOSPITAL_COMMUNITY): Payer: Medicare Other

## 2022-02-23 ENCOUNTER — Ambulatory Visit
Admission: RE | Admit: 2022-02-23 | Discharge: 2022-02-23 | Disposition: A | Discharge status: Home / Self Care | Payer: Medicare Other | Source: Ambulatory Visit | Attending: Internal Medicine | Admitting: Internal Medicine

## 2022-02-23 ENCOUNTER — Other Ambulatory Visit: Payer: Self-pay

## 2022-02-23 DIAGNOSIS — R1310 Dysphagia, unspecified: Secondary | ICD-10-CM

## 2022-02-23 DIAGNOSIS — R002 Palpitations: Secondary | ICD-10-CM

## 2022-02-23 DIAGNOSIS — Z8673 Personal history of transient ischemic attack (TIA), and cerebral infarction without residual deficits: Secondary | ICD-10-CM | POA: Diagnosis not present

## 2022-02-23 DIAGNOSIS — I639 Cerebral infarction, unspecified: Secondary | ICD-10-CM | POA: Diagnosis not present

## 2022-03-06 NOTE — Addendum Note (Signed)
Addended by: Anselm Pancoast on: 03/06/2022 08:03 AM ? ? Modules accepted: Orders ? ?

## 2022-03-21 ENCOUNTER — Telehealth: Payer: Self-pay | Admitting: Emergency Medicine

## 2022-03-21 NOTE — Telephone Encounter (Signed)
Spoke with patient's wife per DPR. Went over results and MD recommendations. Wife verbalized understanding. ? ?Wife reports that patient hasn't been having palpitations or a feeling of his heart pounding since taking the monitor off. States that she thinks they should hold off on starting the low dose beta blocker given his low heart rate. She stated that they will keep an eye on his BP and HR at home and call with any concerns, or call if he starts to have palpitations again.  ? ?Wife voiced appreciation for the call.  ?

## 2022-03-21 NOTE — Telephone Encounter (Signed)
-----   Message from Antonieta Iba, MD sent at 03/20/2022  5:19 PM EDT ----- ?Event monitor ?Normal sinus rhythm, having some episodes of tachycardia and frequent extra beats ?This may explain the palpitations/pounding symptoms he was having before ?Would recommend we start a very low-dose beta-blocker, metoprolol succinate 12.5 daily ?Depending on his heart rate and blood pressure we may be able to go to a whole pill but need to start slowly given average heart rate is 59 ?

## 2022-04-14 ENCOUNTER — Other Ambulatory Visit: Payer: Self-pay | Admitting: Cardiovascular Disease

## 2022-08-03 ENCOUNTER — Encounter: Payer: Self-pay | Admitting: Cardiovascular Disease

## 2022-08-03 ENCOUNTER — Ambulatory Visit: Payer: Medicare Other | Attending: Cardiovascular Disease | Admitting: Cardiovascular Disease

## 2022-08-03 VITALS — BP 120/50 | HR 53 | Ht 68.0 in | Wt 177.1 lb

## 2022-08-03 DIAGNOSIS — R001 Bradycardia, unspecified: Secondary | ICD-10-CM | POA: Insufficient documentation

## 2022-08-03 DIAGNOSIS — I10 Essential (primary) hypertension: Secondary | ICD-10-CM | POA: Insufficient documentation

## 2022-08-03 DIAGNOSIS — E785 Hyperlipidemia, unspecified: Secondary | ICD-10-CM | POA: Diagnosis not present

## 2022-08-03 DIAGNOSIS — R002 Palpitations: Secondary | ICD-10-CM | POA: Insufficient documentation

## 2022-08-03 DIAGNOSIS — I25118 Atherosclerotic heart disease of native coronary artery with other forms of angina pectoris: Secondary | ICD-10-CM | POA: Diagnosis not present

## 2022-08-03 DIAGNOSIS — I639 Cerebral infarction, unspecified: Secondary | ICD-10-CM | POA: Diagnosis present

## 2022-08-03 MED ORDER — EZETIMIBE 10 MG PO TABS: 10.0000 mg | ORAL_TABLET | Freq: Every day | ORAL | 3 refills | Status: DC

## 2022-08-03 MED ORDER — LISINOPRIL 10 MG PO TABS: 10.0000 mg | ORAL_TABLET | Freq: Every day | ORAL | 3 refills | Status: DC

## 2022-08-03 MED ORDER — SIMVASTATIN 40 MG PO TABS: 40.0000 mg | ORAL_TABLET | Freq: Every evening | ORAL | 3 refills | Status: DC

## 2022-08-03 MED ORDER — NITROGLYCERIN 0.4 MG SL SUBL: 0.4000 mg | SUBLINGUAL_TABLET | SUBLINGUAL | 3 refills | Status: DC | PRN

## 2022-08-03 NOTE — Patient Instructions (Signed)
Medication Instructions:  No changes  If you need a refill on your cardiac medications before your next appointment, please call your pharmacy.   Lab work: No new labs needed  Testing/Procedures: No new testing needed  Follow-Up: At [facility], you and your health needs are our priority.  As part of our continuing mission to provide you with exceptional heart care, we have created designated Provider Care Teams.  These Care Teams include your primary Cardiologist (physician) and Advanced Practice Providers (APPs -  Physician Assistants and Nurse Practitioners) who all work together to provide you with the care you need, when you need it.  You will need a follow up appointment in 12 months  Providers on your designated Care Team:   Christopher Berge, NP Ryan Dunn, PA-C Cadence Furth, PA-C  COVID-19 Vaccine Information can be found at: [email or web address] For questions related to vaccine distribution or appointments, please email [email or web address] or call [phone].   

## 2022-08-03 NOTE — Progress Notes (Signed)
Cardiology Office Note  Date:  08/03/2022   ID:  [name] [name]., DOB 03/09/1933, MRN [id number]  PCP:  [name], MD   Chief Complaint  Patient presents with   6 month follow up     "Doing well." Medications reviewed by the patient verbally.     HPI:  [name] is a 88 year-old gentleman with a history of  coronary artery disease,   stent placed to his proximal RCA in September 2007, taxis stent 3.0 x 24 mm, previous syncopal episode (Etiology of his spell is uncertain),  50% carotid disease on the right CVA 2018, History of peripheral vision issues  who presents for routine followup of his coronary artery disease,  Tremor, and stroke  Last seen in clinic March 2022 Chronic back pain, cortisone 1 month ago Pain at night while sleeping Leg pain improving after cortisone  Denies having significant palpitations Continues to stay active, walking up to 1 mile  Lab work reviewed Total chol 99 A1c 5.9   On last clinic visit Zio monitor ordered Was started on low-dose metoprolol succinate He reports this was not started as his symptoms seem to improve  Normal sinus rhythm Patient had a min HR of 38 bpm, max HR of 135 bpm, and avg HR of 59 bpm.   156 Supraventricular Tachycardia/atrial tachycardia runs occurred, the run with the fastest interval lasting 8 beats with a max rate of 135 bpm, the longest lasting 19 beats with an avg rate of 100 bpm.   Idioventricular Rhythm was present. Isolated SVEs were frequent (12.4%, [id number]), SVE Couplets were occasional (4.3%, 21968), and SVE Triplets were occasional (1.5%, 5187).   Isolated VEs were rare (<1.0%, 4920), VE Couplets were rare (<1.0%, 163), and VE Triplets were rare (<1.0%, 3). Ventricular Bigeminy and Trigeminy were present.   1 patient triggered event recorded associated with normal sinus rhythm and PACs   EKG personally reviewed by myself on todays visit Normal sinus rhythm rate 53 bpm poor R wave  progression to the anterior precordial leads, consider old inferior MI, no significant change  past medical history reviewed history of left parieto-occipital hemorrhagic stroke  seen by ophthamologist 3/22 who did HVF test which showed right inf>sup quandrantopsia. felt this was a new finding [name] denied any new visual field deficits.  He has been driving Seen in the Er 03/29/622 Head MRI: nothing acute  01/16/2020,  ED for syncope 3 alcoholic beverages that night, which was more than his usual  woke around 2 AM, and after entering the kitchen, had a syncopal episode  --received his second COVID-19 vaccination 4 days prior. usually spends some time on the edge of the bed and seated before standing, in order to prevent dizziness  daughter reports the fall / LOC woke her from sleep She found him slumped over on the floor.  he was cold to the touch and unable to speak  blood pressure was low.  EMS took his BP when they arrived and noted it was low.  MRA was without acute abnormality and showed mild chronic small vessel ischemic dz and moderate cerebral atrophy. chronic hemorrhagic L temporoparietal infarct.    Echo 1. Left ventricular ejection fraction, by estimation, is 55%.   fall while in [place] 2019 Was watching a football game, stood up and had a mechanical fall tripped over some shoes 10/24/2018 Suffered a hip fracture , was hospitalized  Following the surgery the physicians increased his lisinopril up to 20 mg  daily and placed him on aspirin 325 mg daily He has started to have some upset stomach, wonders if it could be the high-dose aspirin  February 2018 he had acute vision changes, word finding difficulty. He was taken by life flight to [facility] hospital MRI/MRA showing acute stroke Details of scan as below: 1. Acute left parieto-occipital intraparenchymal hematoma with adjacent subarachnoid hemorrhage. This most likely represents hemorrhagic transformation from an embolic  infarct given its configuration. No underlying mass. No other findings to suggest amyloid angiopathy. 2. Normal MRA of the head.  He had a CT scan of the neck showing 50% stenosis on the left, 10% on the right  At the time of his stroke he was on aspirin and Plavix. Plavix was held  echocardiogram  NORMAL LEFT VENTRICULAR SYSTOLIC FUNCTION WITH MILD LVH NORMAL RIGHT VENTRICULAR SYSTOLIC FUNCTION VALVULAR REGURGITATION: TRIVIAL AR, TRIVIAL TR VALVULAR STENOSIS: TRIVIAL AS VERY POOR SOUND TRANSMISSION-DEFINITY CONTRAST USED NO PRIOR STUDY FOR COMPARISON    PMH:   has a past medical history of Acute embolic stroke (HCC) (02/01/2017), BPH (benign prostatic hyperplasia), Bradycardia (11/10/2015), Coronary artery disease, Dizziness (08/21/2012), DYSPEPSIA (07/31/2010), Hyperlipidemia (05/16/2010), Hypertension, HYPERTENSION, BENIGN (05/16/2010), and Stenosis of right carotid artery (02/01/2017).  PSH:    Past Surgical History:  Procedure Laterality Date   HIP SURGERY     right hip    Intestinal Blockage     LOOP RECORDER INSERTION N/A 05/14/2017   Procedure: Loop Recorder Insertion;  Surgeon: Duke Salvia, MD;  Location: [facility] INVASIVE CV LAB;  Service: Cardiovascular;  Laterality: N/A;   SKIN CANCER EXCISION     x2   TONSILLECTOMY      Current Outpatient Medications  Medication Sig Dispense Refill   acetaminophen (TYLENOL) 650 MG CR tablet Take 650 mg by mouth every 8 (eight) hours as needed.      Ascorbic Acid (VITAMIN C) 1000 MG tablet Take 1,000 mg by mouth daily.     aspirin 81 MG tablet Take 1 tablet (81 mg total) by mouth daily. 30 tablet    b complex vitamins tablet Take 1 tablet by mouth daily.     cholecalciferol (VITAMIN D) 1000 units tablet Take 2,000 Units by mouth daily.     cyanocobalamin (VITAMIN B12) 1000 MCG tablet Take 1,000 mcg by mouth daily.     ezetimibe (ZETIA) 10 MG tablet Take 1 tablet (10 mg total) by mouth daily. 90 tablet 0   FLAXSEED, LINSEED, PO Take 2 g by  mouth daily.     lisinopril (PRINIVIL,ZESTRIL) 10 MG tablet Take 1 tablet (10 mg total) by mouth daily. 90 tablet 3   nitroGLYCERIN (NITROSTAT) 0.4 MG SL tablet Place 1 tablet (0.4 mg total) under the tongue every 5 (five) minutes as needed for chest pain. 25 tablet 3   pregabalin (LYRICA) 100 MG capsule TAKE ONE CAPSULE BY MOUTH EVERY MORNING FOR DIABETIC COMPLICATION PERIPHERAL NEUROPATHY     pregabalin (LYRICA) 150 MG capsule TAKE ONE CAPSULE BY MOUTH AT BEDTIME FOR DIABETIC COMPLICATION NOTE: TAKING 100MG  DAILY AND 150MG  AT NIGHT.     simvastatin (ZOCOR) 40 MG tablet Take 40 mg by mouth every evening.      tamsulosin (FLOMAX) 0.4 MG CAPS capsule Take 2 capsules (0.8 mg total) by mouth daily. 30 capsule 0   zinc sulfate 220 (50 Zn) MG capsule Take 1 tablet by mouth daily.     pregabalin (LYRICA) 150 MG capsule Take 150 mg in the am & 100 mg in the pm (  Patient not taking: Reported on 08/03/2022)     No current facility-administered medications for this visit.    Allergies:   Penicillins, Gabapentin, Penicillin g, and Codeine   Social History:  The patient  reports that he has never smoked. He has never used smokeless tobacco. He reports current alcohol use. He reports that he does not use drugs.   Family History:   family history includes Cancer in an other family member; Hyperlipidemia in an other family member; Hypertension in his mother and another family member; Other in an other family member.    Review of Systems: Review of Systems  Constitutional: Negative.   HENT: Negative.    Respiratory: Negative.    Cardiovascular: Negative.   Gastrointestinal: Negative.   Musculoskeletal:  Positive for joint pain.  Neurological:  Positive for tremors.  Psychiatric/Behavioral:  Positive for memory loss.   All other systems reviewed and are negative.   PHYSICAL EXAM: VS:  BP (!) 120/50 (BP Location: Left Arm, Patient Position: Sitting, Cuff Size: Normal)   Pulse (!) 53   Ht 5\' 8"  (1.727  m)   Wt 177 lb 2 oz (80.3 kg)   SpO2 99%   BMI 26.93 kg/m  , BMI Body mass index is 26.93 kg/m. Constitutional:  oriented to person, place, and time. No distress.  HENT:  Head: Grossly normal Eyes:  no discharge. No scleral icterus.  Neck: No JVD, no carotid bruits  Cardiovascular: Regular rate and rhythm, no murmurs appreciated Pulmonary/Chest: Clear to auscultation bilaterally, no wheezes or rails Abdominal: Soft.  no distension.  no tenderness.  Musculoskeletal: Normal range of motion Neurological:  normal muscle tone. Coordination normal. No atrophy Skin: Skin warm and dry Psychiatric: normal affect, pleasant   Recent Labs: No results found for requested labs within last 365 days.    Lipid Panel Lab Results  Component Value Date   CHOL 122 01/23/2010   HDL 33.8 01/23/2010   LDLCALC 69.6 01/23/2010   TRIG 93 01/23/2010    total chol  Wt Readings from Last 3 Encounters:  08/03/22 177 lb 2 oz (80.3 kg)  02/20/22 182 lb 2 oz (82.6 kg)  02/21/21 184 lb (83.5 kg)     ASSESSMENT AND PLAN:  Bradycardia Asymptomatic We will avoid beta-blockers for now  Palpitations Zio monitor with short runs of narrow complex tachycardia The asymptomatic, will avoid beta-blockers given underlying bradycardia  Hyperlipidemia, unspecified hyperlipidemia type - Cholesterol is at goal on the current lipid regimen. No changes to the medications were made.  HYPERTENSION, BENIGN - Plan: EKG 12-Lead Blood pressure is well controlled on today's visit. No changes made to the medications.  Atherosclerosis of native coronary artery of native heart with chronic stable angina pectoris - Currently with no symptoms of angina. No further workup at this time. Continue current medication regimen.  syncope, unspecified syncope type - Plan: EKG 12-Lead Remote syncope No recent episodes of near syncope or syncope, blood pressure stable Prior Zio monitor completed  Acute embolic stroke  (HCC) Loop monitor in place,no arrhythmia in the past 3 years No atrial fib noted, battery has since run out >1 year ago  Stenosis of right carotid artery Nonobstructive disease, Cholesterol at goal  Essential tremor Followed by neurology Not able to tolerate beta-blockers given bradycardia   Total encounter time more than 30 minutes  Greater than 50% was spent in counseling and coordination of care with the patient     Orders Placed This Encounter  Procedures   EKG 12-Lead  Signed, Esmond Plants, M.D., Ph.D. 08/03/2022  [name], [name]

## 2022-08-09 ENCOUNTER — Ambulatory Visit: Payer: Medicare Other | Attending: Unknown Physician Specialty | Admitting: Speech Pathology

## 2022-08-09 ENCOUNTER — Encounter: Payer: Self-pay | Admitting: Speech Pathology

## 2022-08-09 DIAGNOSIS — R498 Other voice and resonance disorders: Secondary | ICD-10-CM | POA: Diagnosis present

## 2022-08-09 DIAGNOSIS — R471 Dysarthria and anarthria: Secondary | ICD-10-CM | POA: Diagnosis present

## 2022-08-09 NOTE — Therapy (Signed)
OUTPATIENT SPEECH LANGUAGE PATHOLOGY VOICE EVALUATION   Patient Name: [name]. MRN: [id number] DOB:[birth year], 88 y.o., male Today's Date: 08/09/2022  PCP: Aram Beecham, MD REFERRING PROVIDER: Dr. [name]   End of Session - 08/09/22 1406     Visit Number 1    Number of Visits 17    Date for SLP Re-Evaluation 11/07/22    SLP Start Time 1400    SLP Stop Time  1500    SLP Time Calculation (min) 60 min    Activity Tolerance Patient tolerated treatment well             Past Medical History:  Diagnosis Date   Acute embolic stroke (HCC) 02/01/2017   BPH (benign prostatic hyperplasia)    Bradycardia 11/10/2015   Coronary artery disease    Dizziness 08/21/2012   DYSPEPSIA 07/31/2010   Qualifier: Diagnosis of  By: Janee Morn RN, Morrie Sheldon     Hyperlipidemia 05/16/2010   Qualifier: Diagnosis of  By: Mariah Milling MD, [name]      Hypertension    HYPERTENSION, BENIGN 05/16/2010   Qualifier: Diagnosis of  By: Mariah Milling MD, [name]     Stenosis of right carotid artery 02/01/2017   Past Surgical History:  Procedure Laterality Date   HIP SURGERY     right hip    Intestinal Blockage     LOOP RECORDER INSERTION N/A 05/14/2017   Procedure: Loop Recorder Insertion;  Surgeon: Duke Salvia, MD;  Location: [facility] INVASIVE CV LAB;  Service: Cardiovascular;  Laterality: N/A;   SKIN CANCER EXCISION     x2   TONSILLECTOMY     Patient Active Problem List   Diagnosis Date Noted   Hx of completed stroke 02/28/2020   Stenosis of right carotid artery 02/01/2017   Acute embolic stroke (HCC) 02/01/2017   Bradycardia 11/10/2015   Essential tremor 06/03/2015   Dizziness 08/21/2012   Syncope 04/10/2011   CALF PAIN, LEFT 10/11/2010   DYSPEPSIA 07/31/2010   CHEST DISCOMFORT 07/31/2010   Hyperlipidemia 05/16/2010   HYPERTENSION, BENIGN 05/16/2010   CAD, NATIVE VESSEL 05/16/2010    Onset date: 08/02/2022  REFERRING DIAG: Hoarseness  THERAPY DIAG:  Other voice and resonance disorders  Rationale  for Evaluation and Treatment Rehabilitation  SUBJECTIVE:   SUBJECTIVE STATEMENT: "It was better but it's been bad again the last 2 days." Pt accompanied by: self  PERTINENT HISTORY: Patient is an 88 y.o. male with past medical history that includes HTN, HLD, CVA, parkinsonism who completed prior course of ST for aphasia in 2018. Referred to ENT by Dr. [name], to whom he reported intermittent voice change. Pt was evaluated by Dr. [name] who noted vocal fold bowing. Pt presents for evaluation for hoarseness.    DIAGNOSTIC FINDINGS: Laryngoscopy on 08/02/22: "mild vocal cord bowing, no paralysis or mass noted." Moderate generalized cerebral atrophy, comparatively mild cerebellar atrophy, and prior left temporoparietal infarct noted per MRI brain 12/13/2021.  PAIN:  Are you having pain? No   FALLS: Has patient fallen in last 6 months? 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 Caffeine/alcohol intake: minimal 1 cup coffee a  day Daily voice use: minimal Environmental risks: None reported Occupational risks: None identified Misuse: None observed or reported Phonotraumatic behaviors: none observed or reported  PERCEPTUAL VOICE ASSESSMENT: Voice quality: hoarse, breathy, and low vocal intensity Vocal abuse:  none noted Resonance: normal Respiratory function: thoracic breathing  OBJECTIVE VOICE ASSESSMENT: Objective Voice Measurements Initial Evaluation 08/09/2022     Maximum phonation time for sustained "ah" 6.7 sec     Average fundamental frequency during sustained "ah" 172 Hz  (1.2 SD above average of 145 Hz +/- 23 for gender)     Relative Average Perterbuation for "ah" 1.648 %    Shimmer 6.184%    Noise to Harmonic Ratio 0.191    Voice Turbulence Index 0.041    Habitual pitch  165 Hz    Highest dynamic pitch in conversational speech 235 Hz    Lowest dynamic pitch in conversational speech 118 Hz    Average dB in conversation 63 dB    Intelligibility ~70% for trained listener in quiet room    V-RQOL 27/50 (fair)      ORAL MOTOR EXAMINATION Facial : WFL Lingual: WFL Velum: WFL Mandible: WFL Cough: WFL Voice: Hoarse, Breathy, Weak    PATIENT REPORTED OUTCOME MEASURES (PROM):  VOICE-RELATED QUALITY OF LIFE (V-RQOL)  The Voice-Related Quality of Life (V-RQOL) measure is a patient-reported outcome measure assessing voice-related problems. Patient reported they Have trouble speaking loudly or being heard in noisy situations, Do not know what will come out when they begin speaking, Feel anxious or frustrated due to voice, Have trouble using the phone because of voice, Have to repeat self to be understood, and Have become less outgoing because of voice  V-RQOL Score:  (27 /50) (10-15 =excellent, 16-20=very good, 21-25 = good, 26-30 = fair, 30+ = poor)   TODAY'S TREATMENT:  Diagnostic intervention attempted using models for breath support, increased vocal intensity. Patient had difficulty following basic models for sustained phonation tasks, which elicited forceful growling and pitch variations. However, patient was able to use loud "hey!" To increase vocal intensity without strain. Subsequently pt able to use this increased intensity in phrase level tasks (imitating SLP) for improvement in vocal quality and intensity (~70 dB on avg).    PATIENT EDUCATION: Education details: Needs to use louder voice to be just loud enough Person educated: Patient Education method: Explanation, Demonstration, and  auditory feedback Education comprehension: verbalized understanding and returned demonstration   HOME EXERCISE PROGRAM: To be provided     GOALS: Goals reviewed with patient? Yes  SHORT TERM GOALS: Target date: 10 sessions  Patient will complete loud /a/ or alternative ("Hey!") average >80 dB with rare min cues. Baseline: 65 dB Goal status: INITIAL  2.  Patient will read phrases average >72 dB 90% acc with modified independence. Baseline:  Goal status: INITIAL  3.  Patient will generate spontaneous responses (sentence level) >72 dB 80% acc with rare min cues. Baseline:  63 dB Goal status: INITIAL    LONG TERM GOALS: Target date: 11/07/2022  Patient will complete basic voice home routine >80% acc with modified independence. Baseline:  Goal status: INITIAL  2.  Patient will maintain intelligibility >85%, intensity >72 dB in 8-10 minutes simple conversation x 3 visits. Baseline:  Goal status: INITIAL  3.  Patient will report improved voice-related quality of life as measured by V-RQOL. Baseline: 27/50 (fair) Goal status: INITIAL    ASSESSMENT:  CLINICAL IMPRESSION: Patient presents with reduced vocal intensity, hoarse, breathy vocal quality, and mild vocal tremor. Vocal  fold bowing noted per ENT, however pt also has history of parkinsonism, and demonstrates signs of hypokinetic dysarthria. Patient had difficulty following models for phonation tasks, however was stimulable with functional phrase imitation to improve vocal quality and intelligibility using intensity-based cues. With education, patient did demonstrate some carryover of SLP recommendations to increase his vocal intensity, using this voice unprompted when SLP left pt in the waiting area. Pt may have difficulty completing vocal exercises, however recommend trial course of therapy with focus on breath pacing and increased vocal intensity to determine if pt able to make functional gains in vocal quality to  improve communication with family.   OBJECTIVE IMPAIRMENTS include dysarthria and voice disorder. These impairments are limiting patient from effectively communicating at home and in community. Factors affecting potential to achieve goals and functional outcome are ability to learn/carryover information.. Patient will benefit from skilled SLP services to address above impairments and improve overall function.  REHAB POTENTIAL: Fair - good  , improves with decreased cognitive demand however pt may have difficulty with carryover  PLAN: SLP FREQUENCY: 2x/week  SLP DURATION: 8 weeks  PLANNED INTERVENTIONS: Cueing hierachy, Functional tasks, SLP instruction and feedback, Compensatory strategies, and Patient/family education    Rondel Baton, MS, Sports administrator [phone]

## 2022-08-14 ENCOUNTER — Ambulatory Visit: Payer: Medicare Other | Admitting: Speech Pathology

## 2022-08-16 ENCOUNTER — Ambulatory Visit: Payer: Medicare Other | Admitting: Speech Pathology

## 2022-08-16 DIAGNOSIS — R471 Dysarthria and anarthria: Secondary | ICD-10-CM

## 2022-08-16 DIAGNOSIS — R498 Other voice and resonance disorders: Secondary | ICD-10-CM | POA: Diagnosis not present

## 2022-08-16 NOTE — Patient Instructions (Signed)
Practice reading and talking LOUD for 15 minutes TWICE a day.   Say each of the following LOUD. Go slowly- no rushing!   Count out loud from 1-30. Go slow so you can say each word loudly and clearly. Say the days of the week and the months of the year 3. Say the names of your family members and friends 4. Read the handouts provided.

## 2022-08-16 NOTE — Therapy (Signed)
OUTPATIENT SPEECH LANGUAGE PATHOLOGY VOICE TREATMENT   Patient Name: [name] [facility]. MRN: [id number] DOB:05-02-1933, 88 y.o., male Today's Date: 08/16/2022  PCP: Aram Beecham, MD REFERRING PROVIDER: Dr. [name]   End of Session - 08/16/22 1406     Visit Number 2    Number of Visits 17    Date for SLP Re-Evaluation 11/07/22    SLP Start Time 1403    SLP Stop Time  1500    SLP Time Calculation (min) 57 min    Activity Tolerance Patient tolerated treatment well             Past Medical History:  Diagnosis Date   Acute embolic stroke (HCC) 02/01/2017   BPH (benign prostatic hyperplasia)    Bradycardia 11/10/2015   Coronary artery disease    Dizziness 08/21/2012   DYSPEPSIA 07/31/2010   Qualifier: Diagnosis of  By: Janee Morn RN, Morrie Sheldon     Hyperlipidemia 05/16/2010   Qualifier: Diagnosis of  By: Mariah Milling MD, [name]      Hypertension    HYPERTENSION, BENIGN 05/16/2010   Qualifier: Diagnosis of  By: Mariah Milling MD, [name]     Stenosis of right carotid artery 02/01/2017   Past Surgical History:  Procedure Laterality Date   HIP SURGERY     right hip    Intestinal Blockage     LOOP RECORDER INSERTION N/A 05/14/2017   Procedure: Loop Recorder Insertion;  Surgeon: Duke Salvia, MD;  Location: [facility] INVASIVE CV [facility];  Service: Cardiovascular;  Laterality: N/A;   SKIN CANCER EXCISION     x2   TONSILLECTOMY     Patient Active Problem List   Diagnosis Date Noted   Hx of completed stroke 02/28/2020   Stenosis of right carotid artery 02/01/2017   Acute embolic stroke (HCC) 02/01/2017   Bradycardia 11/10/2015   Essential tremor 06/03/2015   Dizziness 08/21/2012   Syncope 04/10/2011   CALF PAIN, LEFT 10/11/2010   DYSPEPSIA 07/31/2010   CHEST DISCOMFORT 07/31/2010   Hyperlipidemia 05/16/2010   HYPERTENSION, BENIGN 05/16/2010   CAD, NATIVE VESSEL 05/16/2010    Onset date: 08/02/2022  REFERRING DIAG: Hoarseness  THERAPY DIAG:  Other voice and resonance  disorders  Dysarthria and anarthria  Rationale for Evaluation and Treatment Rehabilitation  SUBJECTIVE:   SUBJECTIVE STATEMENT: "I'm all mixed up." Pt accompanied by: self  PERTINENT HISTORY: Patient is an 88 y.o. male with past medical history that includes HTN, HLD, CVA, parkinsonism who completed prior course of ST for aphasia in 2018. Referred to ENT by Dr. [name], to whom he reported intermittent voice change. Pt was evaluated by Dr. [name] who noted vocal fold bowing. Pt presents for evaluation for hoarseness.    DIAGNOSTIC FINDINGS: Laryngoscopy on 08/02/22: "mild vocal cord bowing, no paralysis or mass noted." Moderate generalized cerebral atrophy, comparatively mild cerebellar atrophy, and prior left temporoparietal infarct noted per MRI brain 12/13/2021.  PAIN:  Are you having pain? No   PATIENT GOALS: Improve voice  OBJECTIVE:   TODAY'S TREATMENT:  Patient reports he forgot about ST appointment until 20 minutes prior. When sitting down in ST chair, pt stated, "I have to shout now," (excessive loudness). Patient required mod cues/modeling for increasing his loudness appropriately. In word level tasks, averaged 65 dB with rushed speech, improved to 73 dB average with moderate cues and modeling for slower rate. Progressed to phrase level, average 73 dB, and sentences, 71 dB. Pt had difficulty reading, and increased cognitive load negatively affected ability to focus on loudness. Transitioned  to simple question and answer (biographical questions), with pt maintaining 72 dB with occasional mod cues.   PATIENT EDUCATION: Education details: HEP, appropriate loudness Person educated: Patient Education method: Explanation, Demonstration, and auditory feedback Education comprehension: verbalized understanding and returned demonstration   HOME EXERCISE PROGRAM: To be provided     GOALS: Goals reviewed with patient? Yes  SHORT TERM GOALS: Target date: 10 sessions  Patient  will complete loud /a/ or alternative ("Hey!") average >80 dB with rare min cues. Baseline: 65 dB Goal status: INITIAL  2.  Patient will read phrases average >72 dB 90% acc with modified independence. Baseline:  Goal status: INITIAL  3.  Patient will generate spontaneous responses (sentence level) >72 dB 80% acc with rare min cues. Baseline:  63 dB Goal status: INITIAL    LONG TERM GOALS: Target date: 11/07/2022  Patient will complete basic voice home routine >80% acc with modified independence. Baseline:  Goal status: INITIAL  2.  Patient will maintain intelligibility >85%, intensity >72 dB in 8-10 minutes simple conversation x 3 visits. Baseline:  Goal status: INITIAL  3.  Patient will report improved voice-related quality of life as measured by V-RQOL. Baseline: 27/50 (fair) Goal status: INITIAL    ASSESSMENT:  CLINICAL IMPRESSION: Patient presents with reduced vocal intensity, hoarse, breathy vocal quality, and mild vocal tremor. Vocal fold bowing noted per ENT, however pt also has history of parkinsonism, and demonstrates signs of hypokinetic dysarthria. Patient able to improve vocal quality and intelligibility using intensity-based cues in phrase level tasks. Pt may have difficulty completing vocal exercises, however recommend trial course of therapy with focus on breath pacing and increased vocal intensity to determine if pt able to make functional gains in vocal quality to improve communication with family. If patient has difficulty with carryover (or attendance), may consider placing pt on hold or d/c until family member able to attend with pt.  OBJECTIVE IMPAIRMENTS include dysarthria and voice disorder. These impairments are limiting patient from effectively communicating at home and in community. Factors affecting potential to achieve goals and functional outcome are ability to learn/carryover information.. Patient will benefit from skilled SLP services to address  above impairments and improve overall function.  REHAB POTENTIAL: Fair - good  , improves with decreased cognitive demand however pt may have difficulty with carryover  PLAN: SLP FREQUENCY: 2x/week  SLP DURATION: 8 weeks  PLANNED INTERVENTIONS: Cueing hierachy, Functional tasks, SLP instruction and feedback, Compensatory strategies, and Patient/family education    Rondel Baton, MS, Sports administrator [phone]

## 2022-08-20 ENCOUNTER — Ambulatory Visit: Payer: Medicare Other | Admitting: Speech Pathology

## 2022-08-22 ENCOUNTER — Ambulatory Visit: Payer: Medicare Other | Admitting: Speech Pathology

## 2022-08-22 ENCOUNTER — Encounter: Payer: Medicare Other | Admitting: Speech Pathology

## 2022-08-28 ENCOUNTER — Ambulatory Visit: Payer: Medicare Other | Admitting: Speech Pathology

## 2022-08-28 DIAGNOSIS — R498 Other voice and resonance disorders: Secondary | ICD-10-CM

## 2022-08-28 DIAGNOSIS — R471 Dysarthria and anarthria: Secondary | ICD-10-CM

## 2022-08-28 NOTE — Therapy (Signed)
OUTPATIENT SPEECH LANGUAGE PATHOLOGY VOICE TREATMENT   Patient Name: Kyrus Hyde [facility]. MRN: [id number] DOB:08-17-1933, 88 y.o., male Today's Date: 08/28/2022  PCP: Fulton Reek, MD REFERRING PROVIDER: Dr. Tami Ribas   End of Session - 08/28/22 1511     Visit Number 3    Number of Visits 17    Date for SLP Re-Evaluation 11/07/22    SLP Start Time 1508    SLP Stop Time  1600    SLP Time Calculation (min) 52 min    Activity Tolerance Patient tolerated treatment well             Past Medical History:  Diagnosis Date   Acute embolic stroke ([place]) 01/10/3661   BPH (benign prostatic hyperplasia)    Bradycardia 11/10/2015   Coronary artery disease    Dizziness 08/21/2012   DYSPEPSIA 07/31/2010   Qualifier: Diagnosis of  By: Grandville Silos RN, Caryl Pina     Hyperlipidemia 05/16/2010   Qualifier: Diagnosis of  By: [name], [name]      Hypertension    HYPERTENSION, BENIGN 05/16/2010   Qualifier: Diagnosis of  By: [name], [name]     Stenosis of right carotid artery 02/01/2017   Past Surgical History:  Procedure Laterality Date   HIP SURGERY     right hip    Intestinal Blockage     LOOP RECORDER INSERTION N/A 05/14/2017   Procedure: Loop Recorder Insertion;  Surgeon: [name], MD;  Location: [place] [facility];  Service: Cardiovascular;  Laterality: N/A;   SKIN CANCER EXCISION     x2   TONSILLECTOMY     Patient Active Problem List   Diagnosis Date Noted   Hx of completed stroke 02/28/2020   Stenosis of right carotid artery 94/76/5465   Acute embolic stroke ([place]) 03/54/6568   Bradycardia 11/10/2015   Essential tremor 06/03/2015   Dizziness 08/21/2012   Syncope 04/10/2011   CALF PAIN, LEFT 10/11/2010   DYSPEPSIA 07/31/2010   CHEST DISCOMFORT 07/31/2010   Hyperlipidemia 05/16/2010   HYPERTENSION, BENIGN 05/16/2010   CAD, NATIVE VESSEL 05/16/2010    Onset date: 08/02/2022  REFERRING DIAG: Hoarseness  THERAPY DIAG:  Other voice and resonance  disorders  Dysarthria and anarthria  Rationale for Evaluation and Treatment Rehabilitation  SUBJECTIVE:   SUBJECTIVE STATEMENT: "I didn't look at it." (Home tasks, while out of town at his son's last week) Pt accompanied by: self  PERTINENT HISTORY: Patient is an 88 y.o. male with past medical history that includes HTN, HLD, CVA, parkinsonism who completed prior course of ST for aphasia in 2018. Referred to ENT by [name], to whom he reported intermittent voice change. Pt was evaluated by Dr. Tami Ribas who noted vocal fold bowing. Pt presents for evaluation for hoarseness.    DIAGNOSTIC FINDINGS: Laryngoscopy on 08/02/22: "mild vocal cord bowing, no paralysis or mass noted." Moderate generalized cerebral atrophy, comparatively mild cerebellar atrophy, and prior left temporoparietal infarct noted per MRI brain 12/13/2021.  PAIN:  Are you having pain? No   PATIENT GOALS: Improve voice  OBJECTIVE:   TODAY'S TREATMENT:  Patient sat in chair and immediately used (too) loud voice. SLP used visual cues (scale) to demonstrate level of loudness and pt responded well to this. Pt used effort 8/10 for reading words, average 73 dB. Occasional min cues necessary to increase vocal effort. Progressed to short conversational responses, average 72 dB with occasional mod cues for effort/loudness.   PATIENT EDUCATION: Education details: HEP, appropriate loudness Person educated: Patient Education method: Explanation,  Demonstration, and auditory feedback Education comprehension: verbalized understanding and returned demonstration   HOME EXERCISE PROGRAM: To be provided     GOALS: Goals reviewed with patient? Yes  SHORT TERM GOALS: Target date: 10 sessions  Patient will complete loud /a/ or alternative ("Hey!") average >80 dB with rare min cues. Baseline: 65 dB Goal status: INITIAL  2.  Patient will read phrases average >72 dB 90% acc with modified independence. Baseline:  Goal status:  INITIAL  3.  Patient will generate spontaneous responses (sentence level) >72 dB 80% acc with rare min cues. Baseline:  63 dB Goal status: INITIAL    LONG TERM GOALS: Target date: 11/07/2022  Patient will complete basic voice home routine >80% acc with modified independence. Baseline:  Goal status: INITIAL  2.  Patient will maintain intelligibility >85%, intensity >72 dB in 8-10 minutes simple conversation x 3 visits. Baseline:  Goal status: INITIAL  3.  Patient will report improved voice-related quality of life as measured by V-RQOL. Baseline: 27/50 (fair) Goal status: INITIAL    ASSESSMENT:  CLINICAL IMPRESSION: Patient presents with reduced vocal intensity, hoarse, breathy vocal quality, and mild vocal tremor. Vocal fold bowing noted per ENT, however pt also has history of parkinsonism, and demonstrates signs of hypokinetic dysarthria. Patient able to improve vocal quality and intelligibility using intensity-based cues in phrase level tasks. Pt may have difficulty completing vocal exercises, however recommend trial course of therapy with focus on breath pacing and increased vocal intensity to determine if pt able to make functional gains in vocal quality to improve communication with family. If patient has difficulty with carryover (or attendance), may consider placing pt on hold or d/c until family member able to attend with pt.  OBJECTIVE IMPAIRMENTS include dysarthria and voice disorder. These impairments are limiting patient from effectively communicating at home and in community. Factors affecting potential to achieve goals and functional outcome are ability to learn/carryover information.. Patient will benefit from skilled SLP services to address above impairments and improve overall function.  REHAB POTENTIAL: Fair - good  , improves with decreased cognitive demand however pt may have difficulty with carryover  PLAN: SLP FREQUENCY: 2x/week  SLP DURATION: 8  weeks  PLANNED INTERVENTIONS: Cueing hierachy, Functional tasks, SLP instruction and feedback, Compensatory strategies, and Patient/family education    Rondel Baton, MS, Sports administrator [phone]

## 2022-08-29 ENCOUNTER — Ambulatory Visit: Payer: Medicare Other | Admitting: Speech Pathology

## 2022-08-29 DIAGNOSIS — R498 Other voice and resonance disorders: Secondary | ICD-10-CM | POA: Diagnosis not present

## 2022-08-29 DIAGNOSIS — R471 Dysarthria and anarthria: Secondary | ICD-10-CM

## 2022-08-29 NOTE — Therapy (Signed)
OUTPATIENT SPEECH LANGUAGE PATHOLOGY VOICE TREATMENT   Patient Name: [name] [facility]. MRN: [id number] DOB:1933/02/25, 88 y.o., male Today's Date: 08/29/2022  PCP: Fulton Reek, MD REFERRING PROVIDER: Dr. Tami Ribas   End of Session - 08/29/22 1510     Visit Number 4    Number of Visits 17    Date for SLP Re-Evaluation 11/07/22    SLP Start Time 1500    SLP Stop Time  1600    SLP Time Calculation (min) 60 min    Activity Tolerance Patient tolerated treatment well             Past Medical History:  Diagnosis Date   Acute embolic stroke ([place]) 12/08/9676   BPH (benign prostatic hyperplasia)    Bradycardia 11/10/2015   Coronary artery disease    Dizziness 08/21/2012   DYSPEPSIA 07/31/2010   Qualifier: Diagnosis of  By: Grandville Silos RN, Caryl Pina     Hyperlipidemia 05/16/2010   Qualifier: Diagnosis of  By: [name], [name]      Hypertension    HYPERTENSION, BENIGN 05/16/2010   Qualifier: Diagnosis of  By: [name], [name]     Stenosis of right carotid artery 02/01/2017   Past Surgical History:  Procedure Laterality Date   HIP SURGERY     right hip    Intestinal Blockage     LOOP RECORDER INSERTION N/A 05/14/2017   Procedure: Loop Recorder Insertion;  Surgeon: [name], MD;  Location: [place] [facility];  Service: Cardiovascular;  Laterality: N/A;   SKIN CANCER EXCISION     x2   TONSILLECTOMY     Patient Active Problem List   Diagnosis Date Noted   Hx of completed stroke 02/28/2020   Stenosis of right carotid artery 93/81/0175   Acute embolic stroke ([place]) 09/26/8526   Bradycardia 11/10/2015   Essential tremor 06/03/2015   Dizziness 08/21/2012   Syncope 04/10/2011   CALF PAIN, LEFT 10/11/2010   DYSPEPSIA 07/31/2010   CHEST DISCOMFORT 07/31/2010   Hyperlipidemia 05/16/2010   HYPERTENSION, BENIGN 05/16/2010   CAD, NATIVE VESSEL 05/16/2010    Onset date: 08/02/2022  REFERRING DIAG: Hoarseness  THERAPY DIAG:  Other voice and resonance  disorders  Dysarthria and anarthria  Rationale for Evaluation and Treatment Rehabilitation  SUBJECTIVE:   SUBJECTIVE STATEMENT: "I have to use 8." Pt accompanied by: self  PERTINENT HISTORY: Patient is an 88 y.o. male with past medical history that includes HTN, HLD, CVA, parkinsonism who completed prior course of ST for aphasia in 2018. Referred to ENT by [name], to whom he reported intermittent voice change. Pt was evaluated by Dr. Tami Ribas who noted vocal fold bowing. Pt presents for evaluation for hoarseness.    DIAGNOSTIC FINDINGS: Laryngoscopy on 08/02/22: "mild vocal cord bowing, no paralysis or mass noted." Moderate generalized cerebral atrophy, comparatively mild cerebellar atrophy, and prior left temporoparietal infarct noted per MRI brain 12/13/2021.  PAIN:  Are you having pain? No   PATIENT GOALS: Improve voice  OBJECTIVE:   TODAY'S TREATMENT:  Pt viewed visual scale and recalled appropriate effort level to use with speaking. Pt used effort 8/10 for reading words, average 74 dB (rare min cues). Pt generated sentences with given word for added cognitive load, avg 73 dB with occasional min cues. Progressed to 1-2 sentence responses average 72 dB with occasional min cues for effort/loudness.   PATIENT EDUCATION: Education details: HEP, appropriate loudness Person educated: Patient Education method: Explanation, Demonstration, and auditory feedback Education comprehension: verbalized understanding and returned demonstration  HOME EXERCISE PROGRAM: To be provided     GOALS: Goals reviewed with patient? Yes  SHORT TERM GOALS: Target date: 10 sessions  Patient will complete loud /a/ or alternative ("Hey!") average >80 dB with rare min cues. Baseline: 65 dB Goal status: INITIAL  2.  Patient will read phrases average >72 dB 90% acc with modified independence. Baseline:  Goal status: INITIAL  3.  Patient will generate spontaneous responses (sentence level)  >72 dB 80% acc with rare min cues. Baseline:  63 dB Goal status: INITIAL    LONG TERM GOALS: Target date: 11/07/2022  Patient will complete basic voice home routine >80% acc with modified independence. Baseline:  Goal status: INITIAL  2.  Patient will maintain intelligibility >85%, intensity >72 dB in 8-10 minutes simple conversation x 3 visits. Baseline:  Goal status: INITIAL  3.  Patient will report improved voice-related quality of life as measured by V-RQOL. Baseline: 27/50 (fair) Goal status: INITIAL    ASSESSMENT:  CLINICAL IMPRESSION: Patient presents with reduced vocal intensity, hoarse, breathy vocal quality, and mild vocal tremor. Vocal fold bowing noted per ENT, however pt also has history of parkinsonism, and demonstrates signs of hypokinetic dysarthria. Patient able to improve vocal quality and intelligibility using intensity-based cues in phrase level tasks, with improving carryover to structured conversation. Pt may have difficulty completing vocal exercises, however recommend trial course of therapy with focus on breath pacing and increased vocal intensity to determine if pt able to make functional gains in vocal quality to improve communication with family. If patient has difficulty with carryover (or attendance), may consider placing pt on hold or d/c until family member able to attend with pt.  OBJECTIVE IMPAIRMENTS include dysarthria and voice disorder. These impairments are limiting patient from effectively communicating at home and in community. Factors affecting potential to achieve goals and functional outcome are ability to learn/carryover information.. Patient will benefit from skilled SLP services to address above impairments and improve overall function.  REHAB POTENTIAL: Fair - good  , improves with decreased cognitive demand however pt may have difficulty with carryover  PLAN: SLP FREQUENCY: 2x/week  SLP DURATION: 8 weeks  PLANNED INTERVENTIONS:  Cueing hierachy, Functional tasks, SLP instruction and feedback, Compensatory strategies, and Patient/family education    Deneise Lever, MS, Actor [phone]

## 2022-08-29 NOTE — Patient Instructions (Signed)
Practice reading and talking LOUD for 15 minutes TWICE a day.    Say each of the following LOUD. Go slowly- no rushing!  Effort 8/10.    Count out loud from 1-30. Go slow so you can say each word loudly and clearly. Say the days of the week and the months of the year 3. Say the names of your family members and friends 4. Read the handouts provided.   [place] will be here next week to work with you while I am on vacation.

## 2022-09-05 ENCOUNTER — Ambulatory Visit: Payer: Medicare Other | Attending: Unknown Physician Specialty | Admitting: Speech Pathology

## 2022-09-05 DIAGNOSIS — R498 Other voice and resonance disorders: Secondary | ICD-10-CM | POA: Insufficient documentation

## 2022-09-05 DIAGNOSIS — R41841 Cognitive communication deficit: Secondary | ICD-10-CM | POA: Insufficient documentation

## 2022-09-05 DIAGNOSIS — R471 Dysarthria and anarthria: Secondary | ICD-10-CM | POA: Diagnosis present

## 2022-09-05 NOTE — Therapy (Signed)
OUTPATIENT SPEECH LANGUAGE PATHOLOGY TREATMENT NOTE   Patient Name: [name] [facility]. MRN: [id number] DOB:10-Nov-1933, 88 y.o., male Today's Date: 09/05/2022  PCP: Fulton Reek, MD REFERRING PROVIDER: Dr. Tami Ribas  END OF SESSION:   End of Session - 09/05/22 1741     Visit Number 5    Number of Visits 17    Date for SLP Re-Evaluation 11/07/22    SLP Start Time 68    SLP Stop Time  1500    SLP Time Calculation (min) 60 min    Activity Tolerance Patient tolerated treatment well             Past Medical History:  Diagnosis Date   Acute embolic stroke ([place]) 06/10/2955   BPH (benign prostatic hyperplasia)    Bradycardia 11/10/2015   Coronary artery disease    Dizziness 08/21/2012   DYSPEPSIA 07/31/2010   Qualifier: Diagnosis of  By: Grandville Silos RN, Caryl Pina     Hyperlipidemia 05/16/2010   Qualifier: Diagnosis of  By: [name], [name]      Hypertension    HYPERTENSION, BENIGN 05/16/2010   Qualifier: Diagnosis of  By: [name], [name]     Stenosis of right carotid artery 02/01/2017   Past Surgical History:  Procedure Laterality Date   HIP SURGERY     right hip    Intestinal Blockage     LOOP RECORDER INSERTION N/A 05/14/2017   Procedure: Loop Recorder Insertion;  Surgeon: [name], MD;  Location: [place] [facility];  Service: Cardiovascular;  Laterality: N/A;   SKIN CANCER EXCISION     x2   TONSILLECTOMY     Patient Active Problem List   Diagnosis Date Noted   Hx of completed stroke 02/28/2020   Stenosis of right carotid artery 21/30/8657   Acute embolic stroke ([place]) 84/69/6295   Bradycardia 11/10/2015   Essential tremor 06/03/2015   Dizziness 08/21/2012   Syncope 04/10/2011   CALF PAIN, LEFT 10/11/2010   DYSPEPSIA 07/31/2010   CHEST DISCOMFORT 07/31/2010   Hyperlipidemia 05/16/2010   HYPERTENSION, BENIGN 05/16/2010   CAD, NATIVE VESSEL 05/16/2010    Onset date: 08/02/2022   REFERRING DIAG: Hoarseness   THERAPY DIAG:  Other voice and resonance  disorders   Dysarthria and anarthria   Rationale for Evaluation and Treatment Rehabilitation   SUBJECTIVE:    SUBJECTIVE STATEMENT: "I guess you look qualified" Pt accompanied by: self   PERTINENT HISTORY: Patient is an 88 y.o. male with past medical history that includes HTN, HLD, CVA, parkinsonism who completed prior course of ST for aphasia in 2018. Referred to ENT by [name], to whom he reported intermittent voice change. Pt was evaluated by Dr. Tami Ribas who noted vocal fold bowing. Pt presents for evaluation for hoarseness.      DIAGNOSTIC FINDINGS: Laryngoscopy on 08/02/22: "mild vocal cord bowing, no paralysis or mass noted." Moderate generalized cerebral atrophy, comparatively mild cerebellar atrophy, and prior left temporoparietal infarct noted per MRI brain 12/13/2021.   PAIN:  Are you having pain? No     PATIENT GOALS: Improve voice   OBJECTIVE:    TODAY'S TREATMENT:  Skilled SLP intervention targeted improved voice intensity for successful communication with conversational partners. Utilized visual feedback via Warden/ranger. Pt often (nearly 100% of the time on initial x5 sentences) declining below 70dB after initial word during sentence reading though improved with visual feedback to 75% of responses remained above 70 dB across x24 simple sentences. Pt generated 1-2 sentences with given topic for reduced  cognitive load with 70% accuracy x10 opportunities in achieving intensity >70 dB. Improved self-awareness and independent re-attempts to increase volume x4 across the session.      PATIENT EDUCATION: Education details: HEP, appropriate loudness Person educated: Patient Education method: Explanation, Demonstration, and auditory feedback Education comprehension: verbalized understanding and returned demonstration     HOME EXERCISE PROGRAM: Written instructions provided       GOALS: Goals reviewed with patient? Yes   SHORT TERM GOALS: Target date: 10  sessions   Patient will complete loud /a/ or alternative ("Hey!") average >80 dB with rare min cues. Baseline: 65 dB Goal status: INITIAL   2.  Patient will read phrases average >72 dB 90% acc with modified independence. Baseline:  Goal status: INITIAL   3.  Patient will generate spontaneous responses (sentence level) >72 dB 80% acc with rare min cues. Baseline:  63 dB Goal status: INITIAL       LONG TERM GOALS: Target date: 11/07/2022   Patient will complete basic voice home routine >80% acc with modified independence. Baseline:  Goal status: INITIAL   2.  Patient will maintain intelligibility >85%, intensity >72 dB in 8-10 minutes simple conversation x 3 visits. Baseline:  Goal status: INITIAL   3.  Patient will report improved voice-related quality of life as measured by V-RQOL. Baseline: 27/50 (fair) Goal status: INITIAL       ASSESSMENT:   CLINICAL IMPRESSION: Patient presents with reduced vocal intensity, hoarse, breathy vocal quality, and mild vocal tremor. Vocal fold bowing noted per ENT, however pt also has history of parkinsonism, and demonstrates signs of hypokinetic dysarthria. Patient able to improve vocal quality and intelligibility using intensity-based cues in phrase level tasks, with improving carryover to structured conversation. Pt may have difficulty completing vocal exercises, however recommend continue trial course of therapy with focus on breath pacing and increased vocal intensity to determine if pt able to make functional gains in vocal quality to improve communication with family. If patient has difficulty with carryover (or attendance), may consider placing pt on hold or d/c until family member able to attend with pt.   OBJECTIVE IMPAIRMENTS include dysarthria and voice disorder. These impairments are limiting patient from effectively communicating at home and in community. Factors affecting potential to achieve goals and functional outcome are ability  to learn/carryover information.. Patient will benefit from skilled SLP services to address above impairments and improve overall function.   REHAB POTENTIAL: Fair - good  , improves with decreased cognitive demand however pt may have difficulty with carryover   PLAN: SLP FREQUENCY: 2x/week   SLP DURATION: 8 weeks   PLANNED INTERVENTIONS: Cueing hierachy, Functional tasks, SLP instruction and feedback, Compensatory strategies, and Patient/family education     [place], M.A. CCC-SLP   Encounter Date: 09/05/2022   Cynda [place], CCC-SLP 09/05/2022, 5:43 PM  [facility] MAIN [facility] SERVICES [address] [place], [place], [address] Phone: [phone]   Fax:  [phone]

## 2022-09-06 ENCOUNTER — Ambulatory Visit: Payer: Medicare Other | Admitting: Speech Pathology

## 2022-09-06 DIAGNOSIS — R498 Other voice and resonance disorders: Secondary | ICD-10-CM | POA: Diagnosis not present

## 2022-09-06 DIAGNOSIS — R41841 Cognitive communication deficit: Secondary | ICD-10-CM

## 2022-09-06 NOTE — Therapy (Signed)
OUTPATIENT SPEECH LANGUAGE PATHOLOGY TREATMENT NOTE   Patient Name: [name] [facility]. MRN: [id number] DOB:[birth year], 88 y.o., male Today's Date: 09/06/2022  PCP: Aram Beecham, MD REFERRING PROVIDER: Dr. [name]    END OF SESSION:   End of Session - 09/06/22 1708     Visit Number 6    Number of Visits 17    Date for SLP Re-Evaluation 11/07/22    SLP Start Time 1600    SLP Stop Time  1700    SLP Time Calculation (min) 60 min    Activity Tolerance Patient tolerated treatment well             Past Medical History:  Diagnosis Date   Acute embolic stroke ([facility]) 02/01/2017   BPH (benign prostatic hyperplasia)    Bradycardia 11/10/2015   Coronary artery disease    Dizziness 08/21/2012   DYSPEPSIA 07/31/2010   Qualifier: Diagnosis of  By: Janee Morn RN, Morrie Sheldon     Hyperlipidemia 05/16/2010   Qualifier: Diagnosis of  By: Mariah Milling MD, [name]      Hypertension    HYPERTENSION, BENIGN 05/16/2010   Qualifier: Diagnosis of  By: Mariah Milling MD, [name]     Stenosis of right carotid artery 02/01/2017   Past Surgical History:  Procedure Laterality Date   HIP SURGERY     right hip    Intestinal Blockage     LOOP RECORDER INSERTION N/A 05/14/2017   Procedure: Loop Recorder Insertion;  Surgeon: Duke Salvia, MD;  Location: [facility];  Service: Cardiovascular;  Laterality: N/A;   SKIN CANCER EXCISION     x2   TONSILLECTOMY     Patient Active Problem List   Diagnosis Date Noted   Hx of completed stroke 02/28/2020   Stenosis of right carotid artery 02/01/2017   Acute embolic stroke ([facility]) 02/01/2017   Bradycardia 11/10/2015   Essential tremor 06/03/2015   Dizziness 08/21/2012   Syncope 04/10/2011   CALF PAIN, LEFT 10/11/2010   DYSPEPSIA 07/31/2010   CHEST DISCOMFORT 07/31/2010   Hyperlipidemia 05/16/2010   HYPERTENSION, BENIGN 05/16/2010   CAD, NATIVE VESSEL 05/16/2010      Onset date: 08/02/2022   REFERRING DIAG: Hoarseness   THERAPY DIAG:  Other voice and  resonance disorders   Dysarthria and anarthria   Rationale for Evaluation and Treatment Rehabilitation   SUBJECTIVE:    SUBJECTIVE STATEMENT: "I had a great evening" Pt accompanied by: self   PERTINENT HISTORY: Patient is an 88 y.o. male with past medical history that includes HTN, HLD, CVA, parkinsonism who completed prior course of ST for aphasia in 2018. Referred to ENT by Dr. [name], to whom he reported intermittent voice change. Pt was evaluated by Dr. [name] who noted vocal fold bowing. Pt presents for evaluation for hoarseness.      DIAGNOSTIC FINDINGS: Laryngoscopy on 08/02/22: "mild vocal cord bowing, no paralysis or mass noted." Moderate generalized cerebral atrophy, comparatively mild cerebellar atrophy, and prior left temporoparietal infarct noted per MRI brain 12/13/2021.   PAIN:  Are you having pain? No     PATIENT GOALS: Improve voice   OBJECTIVE:    TODAY'S TREATMENT:  Skilled SLP intervention targeted improved voice intensity for successful communication with conversational partners. Utilized visual feedback via Psychologist, counselling. Pt demonstrated loud /a/ with at least 80dB with 100% accuracy x5 though on attempt to sustain /a/ patient unable to maintain 80 with average drop to 75 dB.   Patient demonstrated reading single sentences with >72dB intensity with 100% accuracy  x10 across x2 sets. Average across entire x20 opportunities was 79.55 dB with a range of 75-81dB.   Pt generated 1-2 sentences in SLP structured conversation with >72dB with 100% accuracy x20 opportunities. Average self-generated sentences intensity was 77.5 with a range of 73-80.      PATIENT EDUCATION: Education details: HEP, appropriate loudness Person educated: Patient Education method: Explanation, Demonstration, and auditory feedback Education comprehension: verbalized understanding and returned demonstration     HOME EXERCISE PROGRAM: To be provided         GOALS: Goals  reviewed with patient? Yes   SHORT TERM GOALS: Target date: 10 sessions   Patient will complete loud /a/ or alternative ("Hey!") average >80 dB with rare min cues. Baseline: 65 dB Goal status: ON-GOING   2.  Patient will read phrases average >72 dB 90% acc with modified independence. Baseline:  Goal status: ON-GOING   3.  Patient will generate spontaneous responses (sentence level) >72 dB 80% acc with rare min cues. Baseline:  63 dB Goal status: ON-GOING       LONG TERM GOALS: Target date: 11/07/2022   Patient will complete basic voice home routine >80% acc with modified independence. Baseline:  Goal status: ON-GOING   2.  Patient will maintain intelligibility >85%, intensity >72 dB in 8-10 minutes simple conversation x 3 visits. Baseline:  Goal status: ON-GOING   3.  Patient will report improved voice-related quality of life as measured by V-RQOL. Baseline: 27/50 (fair) Goal status: INITIAL       ASSESSMENT:   CLINICAL IMPRESSION: Patient presents with reduced vocal intensity, hoarse, breathy vocal quality, and mild vocal tremor. Vocal fold bowing noted per ENT, however pt also has history of parkinsonism, and demonstrates signs of hypokinetic dysarthria. Patient able to improve vocal quality and intelligibility using intensity-based cues in phrase level tasks, with improving carryover to structured conversation. Pt may have difficulty completing vocal exercises particularly HEP as well as carryover into home environment 2' baseline memory impairments, however recommend continue trial course of therapy with focus on breath pacing and increased vocal intensity to determine if pt able to make and carry over functional gains in vocal quality to improve communication with family. If patient has difficulty with carryover, may consider placing pt on hold or d/c until family member able to attend with pt.   OBJECTIVE IMPAIRMENTS include dysarthria and voice disorder. These  impairments are limiting patient from effectively communicating at home and in community. Factors affecting potential to achieve goals and functional outcome are ability to learn/carryover information.. Patient will benefit from skilled SLP services to address above impairments and improve overall function.   REHAB POTENTIAL: Fair - good  , improves with decreased cognitive demand however pt may have difficulty with carryover   PLAN: SLP FREQUENCY: 2x/week   SLP DURATION: 8 weeks   PLANNED INTERVENTIONS: Cueing hierachy, Functional tasks, SLP instruction and feedback, Compensatory strategies, and Patient/family education   [place] L. Kahmya Pinkham, M.A. [place] [phone]  [place], [place] 09/06/2022, 5:09 PM  [place] MAIN [facility] SERVICES [address] [place], [place], [address] Phone: [phone]   Fax:  [phone]

## 2022-09-11 ENCOUNTER — Ambulatory Visit: Payer: Medicare Other | Admitting: Speech Pathology

## 2022-09-11 DIAGNOSIS — R498 Other voice and resonance disorders: Secondary | ICD-10-CM | POA: Diagnosis not present

## 2022-09-11 DIAGNOSIS — R471 Dysarthria and anarthria: Secondary | ICD-10-CM

## 2022-09-11 NOTE — Patient Instructions (Signed)
Hi [name] and I have been working on helping him improve his voice by talking louder. I was wondering if you might be able to accompany him to some of his therapy sessions so that it might help him be more consistent with using this voice outside of therapy, at home. If you are able, please join him and I will be happy to share with you and demonstrate some of what we have been working on. Please feel free to give me a call at the number below if you would like to discuss.  Thank you,  Deneise Lever Speech-Language Pathologist [phone]

## 2022-09-11 NOTE — Therapy (Unsigned)
OUTPATIENT SPEECH LANGUAGE PATHOLOGY TREATMENT NOTE   Patient Name: [name] [facility]. MRN: [id number] DOB:08-Nov-1933, 88 y.o., male Today's Date: 09/11/2022  PCP: Fulton Reek, MD REFERRING PROVIDER: Dr. Tami Ribas    END OF SESSION:   End of Session - 09/11/22 1412     Visit Number 7    Number of Visits 17    Date for SLP Re-Evaluation 11/07/22    SLP Start Time 1405    SLP Stop Time  1500    SLP Time Calculation (min) 55 min    Activity Tolerance Patient tolerated treatment well             Past Medical History:  Diagnosis Date   Acute embolic stroke ([place]) 05/04/2632   BPH (benign prostatic hyperplasia)    Bradycardia 11/10/2015   Coronary artery disease    Dizziness 08/21/2012   DYSPEPSIA 07/31/2010   Qualifier: Diagnosis of  By: Grandville Silos RN, Caryl Pina     Hyperlipidemia 05/16/2010   Qualifier: Diagnosis of  By: [name], [name]      Hypertension    HYPERTENSION, BENIGN 05/16/2010   Qualifier: Diagnosis of  By: [name], [name]     Stenosis of right carotid artery 02/01/2017   Past Surgical History:  Procedure Laterality Date   HIP SURGERY     right hip    Intestinal Blockage     LOOP RECORDER INSERTION N/A 05/14/2017   Procedure: Loop Recorder Insertion;  Surgeon: [name], MD;  Location: [place] [facility];  Service: Cardiovascular;  Laterality: N/A;   SKIN CANCER EXCISION     x2   TONSILLECTOMY     Patient Active Problem List   Diagnosis Date Noted   Hx of completed stroke 02/28/2020   Stenosis of right carotid artery 35/45/6256   Acute embolic stroke ([place]) 38/93/7342   Bradycardia 11/10/2015   Essential tremor 06/03/2015   Dizziness 08/21/2012   Syncope 04/10/2011   CALF PAIN, LEFT 10/11/2010   DYSPEPSIA 07/31/2010   CHEST DISCOMFORT 07/31/2010   Hyperlipidemia 05/16/2010   HYPERTENSION, BENIGN 05/16/2010   CAD, NATIVE VESSEL 05/16/2010      Onset date: 08/02/2022   REFERRING DIAG: Hoarseness   THERAPY DIAG:  Other voice and  resonance disorders   Dysarthria and anarthria   Rationale for Evaluation and Treatment Rehabilitation   SUBJECTIVE:    SUBJECTIVE STATEMENT: "I'm a little hoarse today." Pt accompanied by: self   PERTINENT HISTORY: Patient is an 88 y.o. male with past medical history that includes HTN, HLD, CVA, parkinsonism who completed prior course of ST for aphasia in 2018. Referred to ENT by [name], to whom he reported intermittent voice change. Pt was evaluated by Dr. Tami Ribas who noted vocal fold bowing. Pt presents for evaluation for hoarseness.      DIAGNOSTIC FINDINGS: Laryngoscopy on 08/02/22: "mild vocal cord bowing, no paralysis or mass noted." Moderate generalized cerebral atrophy, comparatively mild cerebellar atrophy, and prior left temporoparietal infarct noted per MRI brain 12/13/2021.   PAIN:  Are you having pain? No     PATIENT GOALS: Improve voice   OBJECTIVE:    TODAY'S TREATMENT:  SLP continues to educate pt that hoarseness is an indication of sub-optimal volume/effort. Used audio feedback to demonstrate improvement in vocal quality when used increased effort, to which pt agreed with. Patient continued to question SLP about intermittent hoarseness and "something there" in his throat, despite education of ENT findings and need to increase effort/loudness for improved vocal quality. Patient able to  use loudness >72 dB in structured tasks (HEP, simple sentence reading, and short 1-2 sentence responses), however continues to require same level of cuing as in previous sessions. SLP send note home with pt requesting that spouse join pt for sessions if able to improve carryover and task learning.    PATIENT EDUCATION: Education details: HEP, appropriate loudness Person educated: Patient Education method: Explanation, Demonstration, and auditory feedback Education comprehension: verbalized understanding and returned demonstration     HOME EXERCISE PROGRAM: To be provided          GOALS: Goals reviewed with patient? Yes   SHORT TERM GOALS: Target date: 10 sessions   Patient will complete loud /a/ or alternative ("Hey!") average >80 dB with rare min cues. Baseline: 65 dB Goal status: ON-GOING   2.  Patient will read phrases average >72 dB 90% acc with modified independence. Baseline:  Goal status: ON-GOING   3.  Patient will generate spontaneous responses (sentence level) >72 dB 80% acc with rare min cues. Baseline:  63 dB Goal status: ON-GOING       LONG TERM GOALS: Target date: 11/07/2022   Patient will complete basic voice home routine >80% acc with modified independence. Baseline:  Goal status: ON-GOING   2.  Patient will maintain intelligibility >85%, intensity >72 dB in 8-10 minutes simple conversation x 3 visits. Baseline:  Goal status: ON-GOING   3.  Patient will report improved voice-related quality of life as measured by V-RQOL. Baseline: 27/50 (fair) Goal status: INITIAL       ASSESSMENT:   CLINICAL IMPRESSION: Patient presents with reduced vocal intensity, hoarse, breathy vocal quality, and mild vocal tremor. Vocal fold bowing noted per ENT, however pt also has history of parkinsonism, and demonstrates signs of hypokinetic dysarthria. Patient able to improve vocal quality and intelligibility using intensity-based cues in phrase level tasks, with improving carryover to structured conversation. Pt requires similar level of cuing with HEP and demonstrates minimal carryover between sessions (requiring same education and cuing at beginning of each session) due to baseline memory impairments. Pt likely to need support and cuing from family members to make and carry over functional gains in vocal quality to improve communication with family. May consider placing pt on hold or d/c unless family member able to attend with pt.   OBJECTIVE IMPAIRMENTS include dysarthria and voice disorder. These impairments are limiting patient from effectively  communicating at home and in community. Factors affecting potential to achieve goals and functional outcome are ability to learn/carryover information.. Patient will benefit from skilled SLP services to address above impairments and improve overall function.   REHAB POTENTIAL: Fair - good  , improves with decreased cognitive demand however pt may have difficulty with carryover   PLAN: SLP FREQUENCY: 2x/week   SLP DURATION: 8 weeks   PLANNED INTERVENTIONS: Cueing hierachy, Functional tasks, SLP instruction and feedback, Compensatory strategies, and Patient/family education  Rondel Baton, [place], CCC-SLP Speech-Language Pathologist [phone]   Arlana Lindau, CCC-SLP 09/11/2022, 2:13 PM  [place] [facility] MAIN [facility] SERVICES [address] [place], [place], [address] Phone: [phone]   Fax:  [phone]

## 2022-09-12 ENCOUNTER — Encounter: Payer: Medicare Other | Admitting: Speech Pathology

## 2022-09-12 ENCOUNTER — Ambulatory Visit (INDEPENDENT_AMBULATORY_CARE_PROVIDER_SITE_OTHER): Payer: Medicare Other | Admitting: Podiatry

## 2022-09-12 ENCOUNTER — Ambulatory Visit (INDEPENDENT_AMBULATORY_CARE_PROVIDER_SITE_OTHER): Payer: Medicare Other

## 2022-09-12 ENCOUNTER — Encounter: Payer: Self-pay | Admitting: Podiatry

## 2022-09-12 DIAGNOSIS — M5030 Other cervical disc degeneration, unspecified cervical region: Secondary | ICD-10-CM | POA: Insufficient documentation

## 2022-09-12 DIAGNOSIS — I639 Cerebral infarction, unspecified: Secondary | ICD-10-CM | POA: Diagnosis not present

## 2022-09-12 DIAGNOSIS — M869 Osteomyelitis, unspecified: Secondary | ICD-10-CM | POA: Diagnosis not present

## 2022-09-12 DIAGNOSIS — Z8619 Personal history of other infectious and parasitic diseases: Secondary | ICD-10-CM | POA: Insufficient documentation

## 2022-09-12 DIAGNOSIS — H903 Sensorineural hearing loss, bilateral: Secondary | ICD-10-CM | POA: Insufficient documentation

## 2022-09-12 DIAGNOSIS — R9389 Abnormal findings on diagnostic imaging of other specified body structures: Secondary | ICD-10-CM | POA: Insufficient documentation

## 2022-09-12 DIAGNOSIS — B351 Tinea unguium: Secondary | ICD-10-CM

## 2022-09-12 DIAGNOSIS — L97521 Non-pressure chronic ulcer of other part of left foot limited to breakdown of skin: Secondary | ICD-10-CM

## 2022-09-12 DIAGNOSIS — Z719 Counseling, unspecified: Secondary | ICD-10-CM | POA: Insufficient documentation

## 2022-09-12 DIAGNOSIS — G252 Other specified forms of tremor: Secondary | ICD-10-CM | POA: Insufficient documentation

## 2022-09-12 DIAGNOSIS — N400 Enlarged prostate without lower urinary tract symptoms: Secondary | ICD-10-CM | POA: Insufficient documentation

## 2022-09-12 DIAGNOSIS — Z461 Encounter for fitting and adjustment of hearing aid: Secondary | ICD-10-CM | POA: Insufficient documentation

## 2022-09-12 DIAGNOSIS — M79676 Pain in unspecified toe(s): Secondary | ICD-10-CM

## 2022-09-12 DIAGNOSIS — K56609 Unspecified intestinal obstruction, unspecified as to partial versus complete obstruction: Secondary | ICD-10-CM | POA: Insufficient documentation

## 2022-09-12 DIAGNOSIS — R69 Illness, unspecified: Secondary | ICD-10-CM | POA: Insufficient documentation

## 2022-09-12 DIAGNOSIS — I2510 Atherosclerotic heart disease of native coronary artery without angina pectoris: Secondary | ICD-10-CM | POA: Insufficient documentation

## 2022-09-12 DIAGNOSIS — N529 Male erectile dysfunction, unspecified: Secondary | ICD-10-CM | POA: Insufficient documentation

## 2022-09-12 DIAGNOSIS — K922 Gastrointestinal hemorrhage, unspecified: Secondary | ICD-10-CM | POA: Insufficient documentation

## 2022-09-12 MED ORDER — MUPIROCIN 2 % EX OINT: 1.0000 | TOPICAL_OINTMENT | Freq: Two times a day (BID) | CUTANEOUS | 0 refills | Status: DC

## 2022-09-12 MED ORDER — CLINDAMYCIN HCL 150 MG PO CAPS: 150.0000 mg | ORAL_CAPSULE | Freq: Three times a day (TID) | ORAL | 1 refills | Status: DC

## 2022-09-13 ENCOUNTER — Ambulatory Visit: Payer: Medicare Other | Admitting: Speech Pathology

## 2022-09-13 DIAGNOSIS — R471 Dysarthria and anarthria: Secondary | ICD-10-CM

## 2022-09-13 DIAGNOSIS — R498 Other voice and resonance disorders: Secondary | ICD-10-CM | POA: Diagnosis not present

## 2022-09-13 NOTE — Therapy (Signed)
OUTPATIENT SPEECH LANGUAGE PATHOLOGY TREATMENT NOTE   Patient Name: [name] [facility]. MRN: [id number] DOB:02/19/1933, 88 y.o., male Today's Date: 09/13/2022  PCP: Aram Beecham, MD REFERRING PROVIDER: Dr. [name]    END OF SESSION:   End of Session - 09/13/22 1514     Visit Number 8    Number of Visits 17    Date for SLP Re-Evaluation 11/07/22    SLP Start Time 1400    SLP Stop Time  1500    SLP Time Calculation (min) 60 min    Activity Tolerance Patient tolerated treatment well             Past Medical History:  Diagnosis Date   Acute embolic stroke (HCC) 02/01/2017   BPH (benign prostatic hyperplasia)    Bradycardia 11/10/2015   Coronary artery disease    Dizziness 08/21/2012   DYSPEPSIA 07/31/2010   Qualifier: Diagnosis of  By: Janee Morn RN, Morrie Sheldon     Hyperlipidemia 05/16/2010   Qualifier: Diagnosis of  By: Mariah Milling MD, [name]      Hypertension    HYPERTENSION, BENIGN 05/16/2010   Qualifier: Diagnosis of  By: Mariah Milling MD, [name]     Stenosis of right carotid artery 02/01/2017   Past Surgical History:  Procedure Laterality Date   HIP SURGERY     right hip    Intestinal Blockage     LOOP RECORDER INSERTION N/A 05/14/2017   Procedure: Loop Recorder Insertion;  Surgeon: Duke Salvia, MD;  Location: [facility] INVASIVE CV LAB;  Service: Cardiovascular;  Laterality: N/A;   SKIN CANCER EXCISION     x2   TONSILLECTOMY     Patient Active Problem List   Diagnosis Date Noted   Abnormal CXR (chest x-ray) 09/12/2022   ASCVD (arteriosclerotic cardiovascular disease) 09/12/2022   Benign prostatic hyperplasia 09/12/2022   DDD (degenerative disc disease), cervical 09/12/2022   ED (erectile dysfunction) 09/12/2022   Encounter for fitting and adjustment of hearing aid 09/12/2022   History of [facility] spotted fever 09/12/2022   Intention tremor 09/12/2022   Lower GI bleed 09/12/2022   Other ill-defined and unknown causes of morbidity and mortality 09/12/2022   Counseling,  unspecified 09/12/2022   Sensorineural hearing loss, bilateral 09/12/2022   Small bowel obstruction (HCC) 09/12/2022   Low testosterone 07/06/2020   Hx of completed stroke 02/28/2020   Cerebral infarction (HCC) 05/10/2019   Exposure to Agent [facility] 05/10/2019   Mononeuritis 05/10/2019   Closed fracture of right hip with routine healing 02/06/2019   Dementia without behavioral disturbance (HCC) 01/06/2019   Femoral neck fracture (HCC) 10/25/2018   Stenosis of right carotid artery 02/01/2017   Acute embolic stroke (HCC) 02/01/2017   Fever 01/07/2017   Pneumonia of right middle lobe due to infectious organism 01/07/2017   Nontraumatic subcortical hemorrhage of left cerebral hemisphere (HCC) 01/06/2017   Parkinsonism 12/27/2016   Unstable balance 12/20/2015   Bradycardia 11/10/2015   Lumbar radiculitis 11/01/2015   Lumbar stenosis with neurogenic claudication 11/01/2015   Osteoarthritis of spine with radiculopathy, lumbar region 11/01/2015   Essential tremor 06/03/2015   Pseudophakia, left eye 01/12/2015   Dizziness 08/21/2012   AK (actinic keratosis) 03/18/2012   History of nonmelanoma skin cancer 03/18/2012   Syncope 04/10/2011   CALF PAIN, LEFT 10/11/2010   DYSPEPSIA 07/31/2010   CHEST DISCOMFORT 07/31/2010   Hyperlipidemia 05/16/2010   HYPERTENSION, BENIGN 05/16/2010   CAD, NATIVE VESSEL 05/16/2010      Onset date: 08/02/2022   REFERRING DIAG: Hoarseness  THERAPY DIAG:  Other voice and resonance disorders   Dysarthria and anarthria   Rationale for Evaluation and Treatment Rehabilitation   SUBJECTIVE:    SUBJECTIVE STATEMENT: Pt arrives with spouse Pt accompanied by: spouse, [name]   PERTINENT HISTORY: Patient is an 88 y.o. male with past medical history that includes HTN, HLD, CVA, parkinsonism who completed prior course of ST for aphasia in 2018. Referred to ENT by [name], to whom he reported intermittent voice change. Pt was evaluated by Dr. Tami Ribas who  noted vocal fold bowing. Pt presents for evaluation for hoarseness.      DIAGNOSTIC FINDINGS: Laryngoscopy on 08/02/22: "mild vocal cord bowing, no paralysis or mass noted." Moderate generalized cerebral atrophy, comparatively mild cerebellar atrophy, and prior left temporoparietal infarct noted per MRI brain 12/13/2021.   PAIN:  Are you having pain? No     PATIENT GOALS: Improve voice   OBJECTIVE:    TODAY'S TREATMENT:  Spouse reports pt self-correcting loudness at home on occasion, continues to note some days pt is lower intensity than others. He has follow-up with Dr. Tami Ribas on 10/24. Worked with pt and spouse to generate personally relevant phrases, words, and list of family members for home practice with vocal effort. Educated spouse on practice frequency, cuing for loudness using effort level, and using personally relevant phrases to help recalibrate loudness when voice is low. Pt maintained vocal intensity average 75 dB when reading phrases and with automatic speech tasks 100% of the time with modeling/instruction. Progressed to short structured responses, average 73 dB, with carryover noted when having side conversations with spouse (pt asked, "am I talking too loud?").    PATIENT EDUCATION: Education details: HEP, appropriate loudness Person educated: Patient Education method: Explanation, Demonstration, and auditory feedback Education comprehension: verbalized understanding and returned demonstration     HOME EXERCISE PROGRAM: To be provided         GOALS: Goals reviewed with patient? Yes   SHORT TERM GOALS: Target date: 10 sessions   Patient will complete loud /a/ or alternative ("Hey!") average >80 dB with rare min cues. Baseline: 65 dB Goal status: ON-GOING   2.  Patient will read phrases average >72 dB 90% acc with modified independence. Baseline:  Goal status: ON-GOING   3.  Patient will generate spontaneous responses (sentence level) >72 dB 80% acc with rare  min cues. Baseline:  63 dB Goal status: ON-GOING       LONG TERM GOALS: Target date: 11/07/2022   Patient will complete basic voice home routine >80% acc with modified independence. Baseline:  Goal status: ON-GOING   2.  Patient will maintain intelligibility >85%, intensity >72 dB in 8-10 minutes simple conversation x 3 visits. Baseline:  Goal status: ON-GOING   3.  Patient will report improved voice-related quality of life as measured by V-RQOL. Baseline: 27/50 (fair) Goal status: INITIAL       ASSESSMENT:   CLINICAL IMPRESSION: Patient presents with reduced vocal intensity, hoarse, breathy vocal quality, and mild vocal tremor. Vocal fold bowing noted per ENT, however pt also has history of parkinsonism, and demonstrates signs of hypokinetic dysarthria. Patient able to improve vocal quality and intelligibility using intensity-based cues in phrase level tasks, with improving carryover to structured conversation. Pt requires similar level of cuing with HEP and demonstrates minimal carryover between sessions (requiring same education and cuing at beginning of each session) due to baseline memory impairments. Pt likely to need support and cuing from family members to make and carry over functional  gains in vocal quality to improve communication with family. Spouse attended session today and demonstrates understanding of pt's HEP as well as cuing strategies for home. Continue skilled ST to improve vocal quality for improved communication at home.    OBJECTIVE IMPAIRMENTS include dysarthria and voice disorder. These impairments are limiting patient from effectively communicating at home and in community. Factors affecting potential to achieve goals and functional outcome are ability to learn/carryover information.. Patient will benefit from skilled SLP services to address above impairments and improve overall function.   REHAB POTENTIAL: Fair - good  , improves with decreased cognitive  demand however pt may have difficulty with carryover   PLAN: SLP FREQUENCY: 2x/week   SLP DURATION: 8 weeks   PLANNED INTERVENTIONS: Cueing hierachy, Functional tasks, SLP instruction and feedback, Compensatory strategies, and Patient/family education  Deneise Lever, MS, CCC-SLP Speech-Language Pathologist [phone]   Aliene Altes, [place] 09/13/2022, 3:15 PM  [place] MAIN [facility] SERVICES [address] [place], [place], [address] Phone: [phone]   Fax:  [phone]

## 2022-09-13 NOTE — Patient Instructions (Signed)
Practice reading and talking LOUD 15 minutes twice a day; Read these phrases. Effort 8 of 10. I'll tell you later. Hold on, give me a minute. I don't know! Good morning! Nite nite! Garage door is down.  [name]! Where are you? Where are you going? What's for dinner? Where's my billfold? [place]  2. Count out loud! 1-30.  3. Days of the week.  4. Months of the year. 5. Read handouts out loud, or practice saying a few sentences about pictures (family photos, magazines)

## 2022-09-13 NOTE — Progress Notes (Signed)
Subjective:  Patient ID: Calvin Shiner., male    DOB: 1933/11/29,  MRN: [id number] HPI Chief Complaint  Patient presents with   Toe Pain    Neuropathy bilateral - noticed 2nd toenail is loose and starting to get red around the base, 3rd toe left is red and swollen-noticed x 1 week   New Patient (Initial Visit)    Est pt 70    88 y.o. male presents with the above complaint.   ROS: Denies fever chills nausea vomiting muscle aches pains calf pain back pain chest pain shortness of breath.  Past Medical History:  Diagnosis Date   Acute embolic stroke ([place]) 08/07/1739   BPH (benign prostatic hyperplasia)    Bradycardia 11/10/2015   Coronary artery disease    Dizziness 08/21/2012   DYSPEPSIA 07/31/2010   Qualifier: Diagnosis of  By: Grandville Silos RN, Caryl Pina     Hyperlipidemia 05/16/2010   Qualifier: Diagnosis of  By: [name], [name]      Hypertension    HYPERTENSION, BENIGN 05/16/2010   Qualifier: Diagnosis of  By: [name], [name]     Stenosis of right carotid artery 02/01/2017   Past Surgical History:  Procedure Laterality Date   HIP SURGERY     right hip    Intestinal Blockage     LOOP RECORDER INSERTION N/A 05/14/2017   Procedure: Loop Recorder Insertion;  Surgeon: [name], MD;  Location: [place] CV LAB;  Service: Cardiovascular;  Laterality: N/A;   SKIN CANCER EXCISION     x2   TONSILLECTOMY      Current Outpatient Medications:    clindamycin (CLEOCIN) 150 MG capsule, Take 1 capsule (150 mg total) by mouth 3 (three) times daily., Disp: 30 capsule, Rfl: 1   mupirocin ointment (BACTROBAN) 2 %, Apply 1 Application topically 2 (two) times daily., Disp: 22 g, Rfl: 0   acetaminophen (TYLENOL) 650 MG CR tablet, Take 650 mg by mouth every 8 (eight) hours as needed. , Disp: , Rfl:    Ascorbic Acid (VITAMIN C) 1000 MG tablet, Take 1,000 mg by mouth daily., Disp: , Rfl:    aspirin 81 MG tablet, Take 1 tablet (81 mg total) by mouth daily., Disp: 30 tablet, Rfl:    b  complex vitamins tablet, Take 1 tablet by mouth daily., Disp: , Rfl:    baclofen (LIORESAL) 20 MG tablet, Take 20 mg by mouth 2 (two) times daily., Disp: , Rfl:    cholecalciferol (VITAMIN D) 1000 units tablet, Take 2,000 Units by mouth daily., Disp: , Rfl:    cyanocobalamin (VITAMIN B12) 1000 MCG tablet, Take 1,000 mcg by mouth daily., Disp: , Rfl:    ezetimibe (ZETIA) 10 MG tablet, Take 1 tablet (10 mg total) by mouth daily., Disp: 90 tablet, Rfl: 3   FLAXSEED, LINSEED, PO, Take 2 g by mouth daily., Disp: , Rfl:    lisinopril (ZESTRIL) 20 MG tablet, Take by mouth., Disp: , Rfl:    nitroGLYCERIN (NITROSTAT) 0.4 MG SL tablet, Place 1 tablet (0.4 mg total) under the tongue every 5 (five) minutes as needed for chest pain., Disp: 25 tablet, Rfl: 3   pregabalin (LYRICA) 100 MG capsule, TAKE ONE CAPSULE BY MOUTH EVERY MORNING FOR DIABETIC COMPLICATION PERIPHERAL NEUROPATHY, Disp: , Rfl:    pregabalin (LYRICA) 150 MG capsule, Take 150 mg in the am & 100 mg in the pm (Patient not taking: Reported on 08/03/2022), Disp: , Rfl:    pregabalin (LYRICA) 150 MG capsule, TAKE ONE CAPSULE BY  MOUTH AT BEDTIME FOR DIABETIC COMPLICATION NOTE: TAKING 100MG  DAILY AND 150MG  AT NIGHT., Disp: , Rfl:    simvastatin (ZOCOR) 40 MG tablet, Take 1 tablet (40 mg total) by mouth every evening., Disp: 90 tablet, Rfl: 3   tamsulosin (FLOMAX) 0.4 MG CAPS capsule, Take 2 capsules (0.8 mg total) by mouth daily., Disp: 30 capsule, Rfl: 0   zinc sulfate 220 (50 Zn) MG capsule, Take 1 tablet by mouth daily., Disp: , Rfl:   Allergies  Allergen Reactions   Penicillins Rash    Rash    Rash    Gabapentin    Penicillin G    Codeine Nausea Only and Nausea And Vomiting    Other reaction(s): Vomiting Other reaction(s): Vomiting   Review of Systems Objective:  There were no vitals filed for this visit.  General: Well developed, nourished, in no acute distress, alert and oriented x3   Dermatological: Skin is warm, dry and supple  bilateral.  Toenails 1 through 5 bilaterally are thickened dystrophic second toenail on the left foot is almost all.  Today.  Remaining integument appears unremarkable at this time. There are no open sores, no preulcerative lesions, no rash or signs of infection present.  He has a skin breakdown with fluid beneath the epidermis with superficial ulceration third digit distal aspect left foot.  He does have a sending cellulitis to left foot only which was marked and dated today.  It has just reached the level of the midfoot at the tarsometatarsal junctions.  Vascular: Dorsalis Pedis artery and Posterior Tibial artery pedal pulses are 2/4 bilateral absent dorsalis pedis pulse bilateral with immedate capillary fill time. Pedal hair growth present. No varicosities and no lower extremity edema present bilateral.   Neruologic: Grossly intact via light touch bilateral. Vibratory intact via tuning fork bilateral. Protective threshold with Semmes Wienstein monofilament i diminished to all pedal sites bilateral. Patellar and Achilles deep tendon reflexes 2+ bilateral. No Babinski or clonus noted bilateral.   Musculoskeletal: No gross boney pedal deformities bilateral. No pain, crepitus, or limitation noted with foot and ankle range of motion bilateral. Muscular strength 5/5 in all groups tested bilateral.  Hammertoe deformities lateral left greater than right third toe is rigid in nature most likely resulting in the distal clavus and ultimate breakdown of the skin. Gait: Unassisted, Nonantalgic.    Radiographs:  Radiographs taken today demonstrate an osseously mature individual area of focus around the third toe demonstrates some breakdown of the cortex in the plantar aspect of the distal phalanx third toe left foot.  This is concerning for osteomyelitis.  Assessment & Plan:   Assessment: Neuropathy bilaterally.  Ulceration distal aspect third toe left with possible osteomyelitis  Plan: Started him on  clindamycin and Bactroban ointment.  Discussed wound care with his wife she understands this is amenable to it.  We placed a sulcus pad to help hold the toes off the floor and placed in a Darco shoe.  Dressed the toe today as I demonstrated to his wife  Instructed both of them to watch for fever chills nausea vomiting muscle aches pains worsening of the cellulitic process he is to go to the emergency department should these arise.  Otherwise I will follow-up with him in about 2 weeks     [place]uther [place]raim T. [place], 

## 2022-09-18 ENCOUNTER — Ambulatory Visit: Payer: Medicare Other | Admitting: Speech Pathology

## 2022-09-18 DIAGNOSIS — R498 Other voice and resonance disorders: Secondary | ICD-10-CM | POA: Diagnosis not present

## 2022-09-18 DIAGNOSIS — R471 Dysarthria and anarthria: Secondary | ICD-10-CM

## 2022-09-18 NOTE — Therapy (Signed)
Patient Details  Name: [name]. MRN: [id number] Date of Birth: 1933-08-07  Today's Date: 09/18/2022 Time: 6283-1517   Visit#: 9 of 17 Re-eval: 11/07/22   OUTPATIENT SPEECH LANGUAGE PATHOLOGY TREATMENT NOTE   Patient Name: [name] [facility] MRN: [id number] DOB:06/20/1933, 88 y.o., male Today's Date: 09/18/2022  PCP: Fulton Reek, MD REFERRING PROVIDER: Dr. Tami Ribas    END OF SESSION:   End of Session - 09/18/22 1400     Visit Number 9    Number of Visits 17    Date for SLP Re-Evaluation 11/07/22    SLP Start Time 61    SLP Stop Time  6269    SLP Time Calculation (min) 45 min    Activity Tolerance Patient tolerated treatment well             Past Medical History:  Diagnosis Date   Acute embolic stroke ([place]) 03/10/5461   BPH (benign prostatic hyperplasia)    Bradycardia 11/10/2015   Coronary artery disease    Dizziness 08/21/2012   DYSPEPSIA 07/31/2010   Qualifier: Diagnosis of  By: Grandville Silos RN, Caryl Pina     Hyperlipidemia 05/16/2010   Qualifier: Diagnosis of  By: [name], [name]      Hypertension    HYPERTENSION, BENIGN 05/16/2010   Qualifier: Diagnosis of  By: [name], [name]     Stenosis of right carotid artery 02/01/2017   Past Surgical History:  Procedure Laterality Date   HIP SURGERY     right hip    Intestinal Blockage     LOOP RECORDER INSERTION N/A 05/14/2017   Procedure: Loop Recorder Insertion;  Surgeon: [name], MD;  Location: [place] CV LAB;  Service: Cardiovascular;  Laterality: N/A;   SKIN CANCER EXCISION     x2   TONSILLECTOMY     Patient Active Problem List   Diagnosis Date Noted   Abnormal CXR (chest x-ray) 09/12/2022   ASCVD (arteriosclerotic cardiovascular disease) 09/12/2022   Benign prostatic hyperplasia 09/12/2022   DDD (degenerative disc disease), cervical 09/12/2022   ED (erectile dysfunction) 09/12/2022   Encounter for fitting and adjustment of hearing aid 09/12/2022   History of [facility] spotted fever 09/12/2022   Intention tremor 09/12/2022   Lower GI bleed 09/12/2022   Other ill-defined and unknown causes of morbidity and mortality 09/12/2022   Counseling, unspecified 09/12/2022   Sensorineural hearing loss, bilateral 09/12/2022   Small bowel obstruction ([place]) 09/12/2022   Low testosterone 07/06/2020   Hx of completed stroke 02/28/2020   Cerebral infarction ([place]) 05/10/2019   Exposure to Agent [facility] 05/10/2019   Mononeuritis 05/10/2019   Closed fracture of right hip with routine healing 02/06/2019   Dementia without behavioral disturbance ([place]) 01/06/2019   Femoral neck fracture ([place]) 10/25/2018   Stenosis of right carotid artery 70/35/0093   Acute embolic stroke ([place]) 81/82/9937   Fever 01/07/2017   Pneumonia of right middle lobe due to infectious organism 01/07/2017   Nontraumatic subcortical hemorrhage of left cerebral hemisphere ([place]) 01/06/2017   Parkinsonism 12/27/2016   Unstable balance 12/20/2015   Bradycardia 11/10/2015   Lumbar radiculitis 11/01/2015   Lumbar stenosis with neurogenic claudication 11/01/2015   Osteoarthritis of spine with radiculopathy, lumbar region 11/01/2015   Essential tremor 06/03/2015   Pseudophakia, left eye 01/12/2015   Dizziness 08/21/2012   AK (actinic keratosis) 03/18/2012   History of nonmelanoma skin cancer 03/18/2012   Syncope 04/10/2011   CALF PAIN, LEFT 10/11/2010   DYSPEPSIA 07/31/2010   CHEST DISCOMFORT 07/31/2010  Hyperlipidemia 05/16/2010   HYPERTENSION, BENIGN 05/16/2010   CAD, NATIVE VESSEL 05/16/2010      Onset date: 08/02/2022   REFERRING DIAG: Hoarseness   THERAPY DIAG:  Other voice and resonance disorders   Dysarthria and anarthria   Rationale for Evaluation and Treatment Rehabilitation   SUBJECTIVE:    SUBJECTIVE STATEMENT: Pt arrived alone for therapy   PERTINENT HISTORY: Patient is an 88 y.o. male with past medical history that includes HTN, HLD, CVA, parkinsonism who completed  prior course of ST for aphasia in 2018. Referred to ENT by Dr. [name], to whom he reported intermittent voice change. Pt was evaluated by Dr. [name] who noted vocal fold bowing. Pt presents for evaluation for hoarseness.      DIAGNOSTIC FINDINGS: Laryngoscopy on 08/02/22: "mild vocal cord bowing, no paralysis or mass noted." Moderate generalized cerebral atrophy, comparatively mild cerebellar atrophy, and prior left temporoparietal infarct noted per MRI brain 12/13/2021.   PAIN:  Are you having pain? No     PATIENT GOALS: Improve voice   OBJECTIVE:    TODAY'S TREATMENT:  Pt reports "she thinks I'm too quiet", regarding primary SLP. Pt indicates using strategies about 75% of the time at home. Pt has follow-up with Dr. [name] on 10/24. Worked with pt spouse on HEP and encouraged continued work on [employer]. Educated pt re: diaphragmatic breathing, practicing on gym mat table to increase comfort level with the exercise. Provided instructions and additional tasks in written form. Pt continues to maintain vocal intensity average 75 dB when reading phrases and with automatic speech tasks 100% of the time with modeling/instruction. Pt was noted to trail off with decreasing dB at the end of sentences. Pt was pleasant and cooperative with unfamiliar therapist. Pt produced sustained /ah/ at 77-80dB.   PATIENT EDUCATION: Education details: HEP, appropriate loudness Person educated: Patient Education method: Explanation, Demonstration, and auditory feedback Education comprehension: verbalized understanding and returned demonstration     HOME EXERCISE PROGRAM: Pt has in his possession         GOALS: Goals reviewed with patient? Yes   SHORT TERM GOALS: Target date: 10 sessions   Patient will complete loud /a/ or alternative ("Hey!") average >80 dB with rare min cues. Baseline: 65 dB Goal status: ON-GOING   2.  Patient will read phrases average >72 dB 90% acc with modified  independence. Baseline:  Goal status: ON-GOING   3.  Patient will generate spontaneous responses (sentence level) >72 dB 80% acc with rare min cues. Baseline:  63 dB Goal status: ON-GOING       LONG TERM GOALS: Target date: 11/07/2022   Patient will complete basic voice home routine >80% acc with modified independence. Baseline:  Goal status: ON-GOING   2.  Patient will maintain intelligibility >85%, intensity >72 dB in 8-10 minutes simple conversation x 3 visits. Baseline:  Goal status: ON-GOING   3.  Patient will report improved voice-related quality of life as measured by V-RQOL. Baseline: 27/50 (fair) Goal status: INITIAL       ASSESSMENT:   CLINICAL IMPRESSION: Patient presents with reduced vocal intensity, hoarse, breathy vocal quality, and mild vocal tremor. Vocal fold bowing noted per ENT, however pt also has history of parkinsonism, and demonstrates signs of hypokinetic dysarthria. Patient able to improve vocal quality and intelligibility using intensity-based cues in phrase level tasks, with improving carryover to structured conversation. Pt requires similar level of cuing with HEP and demonstrates minimal carryover between sessions (requiring same education and cuing at beginning  of each session) due to baseline memory impairments. Pt likely to need support and cuing from family members to make and carry over functional gains in vocal quality to improve communication with family. Spouse did not attend session today. Continue skilled ST to improve vocal quality for improved communication at home.    OBJECTIVE IMPAIRMENTS include dysarthria and voice disorder. These impairments are limiting patient from effectively communicating at home and in community. Factors affecting potential to achieve goals and functional outcome are ability to learn/carryover information.. Patient will benefit from skilled SLP services to address above impairments and improve overall function.    REHAB POTENTIAL: Fair - good  , improves with decreased cognitive demand however pt may have difficulty with carryover   PLAN: SLP FREQUENCY: 2x/week   SLP DURATION: 8 weeks PLANNED INTERVENTIONS: Cueing hierachy, Functional tasks, SLP instruction and feedback, Compensatory strategies, and Patient/family education   Aurora B. [name] [facility], CCC-SLP Speech Language Pathologist Office: [phone]  Leigh Aurora, Franchot Erichsen 09/18/2022, 3:30 PM  [place] [facility] MAIN [facility] SERVICES [address] [place], [place], [address] Phone: [phone]   Fax:  [phone]

## 2022-09-19 ENCOUNTER — Ambulatory Visit: Payer: Medicare Other | Admitting: Speech Pathology

## 2022-09-19 DIAGNOSIS — R498 Other voice and resonance disorders: Secondary | ICD-10-CM

## 2022-09-19 DIAGNOSIS — R471 Dysarthria and anarthria: Secondary | ICD-10-CM

## 2022-09-19 NOTE — Therapy (Signed)
Patient Details  Name: [name] [facility]. MRN: [id number] Date of Birth: [birth year]  Today's Date: 09/19/2022 Time: 4098-1191   Visit#: 9 of 17 Re-eval: 11/07/22   OUTPATIENT SPEECH LANGUAGE PATHOLOGY TREATMENT NOTE   Patient Name: [name]. MRN: [id number] DOB:1933/05/31, 88 y.o., male Today's Date: 09/19/2022  PCP: Fulton Reek, MD REFERRING PROVIDER: Dr. Tami Ribas   Speech Therapy Progress Note  Dates of Reporting Period: 08/09/2022 to 09/19/2022  Objective: Patient has been seen for 10 speech therapy sessions this reporting period targeting voice and dysarthria. Patient is making progress toward LTGs and met 2/3 STGs this reporting period. See skilled intervention, clinical impressions, and goals below for details.  END OF SESSION:   End of Session - 09/19/22 1404     Visit Number 10    Number of Visits 17    Date for SLP Re-Evaluation 11/07/22    SLP Start Time 80    SLP Stop Time  1500    SLP Time Calculation (min) 60 min    Activity Tolerance Patient tolerated treatment well             Past Medical History:  Diagnosis Date   Acute embolic stroke ([place]) 3/0/8657   BPH (benign prostatic hyperplasia)    Bradycardia 11/10/2015   Coronary artery disease    Dizziness 08/21/2012   DYSPEPSIA 07/31/2010   Qualifier: Diagnosis of  By: Grandville Silos RN, Caryl Pina     Hyperlipidemia 05/16/2010   Qualifier: Diagnosis of  By: [name], [name]      Hypertension    HYPERTENSION, BENIGN 05/16/2010   Qualifier: Diagnosis of  By: [name], [name]     Stenosis of right carotid artery 02/01/2017   Past Surgical History:  Procedure Laterality Date   HIP SURGERY     right hip    Intestinal Blockage     LOOP RECORDER INSERTION N/A 05/14/2017   Procedure: Loop Recorder Insertion;  Surgeon: [name], MD;  Location: [facility] [place] [facility];  Service: Cardiovascular;  Laterality: N/A;   SKIN CANCER EXCISION     x2   TONSILLECTOMY     Patient Active Problem  List   Diagnosis Date Noted   Abnormal CXR (chest x-ray) 09/12/2022   ASCVD (arteriosclerotic cardiovascular disease) 09/12/2022   Benign prostatic hyperplasia 09/12/2022   DDD (degenerative disc disease), cervical 09/12/2022   ED (erectile dysfunction) 09/12/2022   Encounter for fitting and adjustment of hearing aid 09/12/2022   History of [facility] spotted fever 09/12/2022   Intention tremor 09/12/2022   Lower GI bleed 09/12/2022   Other ill-defined and unknown causes of morbidity and mortality 09/12/2022   Counseling, unspecified 09/12/2022   Sensorineural hearing loss, bilateral 09/12/2022   Small bowel obstruction ([place]) 09/12/2022   Low testosterone 07/06/2020   Hx of completed stroke 02/28/2020   Cerebral infarction ([place]) 05/10/2019   Exposure to Agent [facility] 05/10/2019   Mononeuritis 05/10/2019   Closed fracture of right hip with routine healing 02/06/2019   Dementia without behavioral disturbance ([place]) 01/06/2019   Femoral neck fracture ([place]) 10/25/2018   Stenosis of right carotid artery 84/69/6295   Acute embolic stroke ([place]) 28/41/3244   Fever 01/07/2017   Pneumonia of right middle lobe due to infectious organism 01/07/2017   Nontraumatic subcortical hemorrhage of left cerebral hemisphere ([place]) 01/06/2017   Parkinsonism 12/27/2016   Unstable balance 12/20/2015   Bradycardia 11/10/2015   Lumbar radiculitis 11/01/2015   Lumbar stenosis with neurogenic claudication 11/01/2015   Osteoarthritis of spine  with radiculopathy, lumbar region 11/01/2015   Essential tremor 06/03/2015   Pseudophakia, left eye 01/12/2015   Dizziness 08/21/2012   AK (actinic keratosis) 03/18/2012   History of nonmelanoma skin cancer 03/18/2012   Syncope 04/10/2011   CALF PAIN, LEFT 10/11/2010   DYSPEPSIA 07/31/2010   CHEST DISCOMFORT 07/31/2010   Hyperlipidemia 05/16/2010   HYPERTENSION, BENIGN 05/16/2010   CAD, NATIVE VESSEL 05/16/2010      Onset date: 08/02/2022   REFERRING DIAG:  Hoarseness   THERAPY DIAG:  Other voice and resonance disorders   Dysarthria and anarthria   Rationale for Evaluation and Treatment Rehabilitation   SUBJECTIVE:    SUBJECTIVE STATEMENT: "I learned something."   PERTINENT HISTORY: Patient is an 88 y.o. male with past medical history that includes HTN, HLD, CVA, parkinsonism who completed prior course of ST for aphasia in 2018. Referred to ENT by [name], to whom he reported intermittent voice change. Pt was evaluated by Dr. Tami Ribas who noted vocal fold bowing. Pt presents for evaluation for hoarseness.      DIAGNOSTIC FINDINGS: Laryngoscopy on 08/02/22: "mild vocal cord bowing, no paralysis or mass noted." Moderate generalized cerebral atrophy, comparatively mild cerebellar atrophy, and prior left temporoparietal infarct noted per MRI brain 12/13/2021.   PAIN:  Are you having pain? No     PATIENT GOALS: Improve voice   OBJECTIVE:    TODAY'S TREATMENT:  Due to cognitive load and reduced carryover, discontinued pt home practice with abdominal breathing to continue with HEP that was simplified to maximize carryover and reduce cognitive load. Patient used effort 8/10 to read personally relevant phrases with 100% intelligibility and average 75 dB with modified independence. Demonstrated carryover of loudness to spontaneous responses between structured tasks. Progressed to question and answer (short responses), with pt maintaining average of 73 dB with occasional min cues. Progressed to simple conversation with pt requiring verbal cues x 5 to increase intensity over 15 minute period. Patient did not question SLP this session when he was instructed to increase volume; reports his wife is cuing him at home for this as well.    PATIENT EDUCATION: Education details: HEP, appropriate loudness Person educated: Patient Education method: Explanation, Demonstration, and auditory feedback Education comprehension: verbalized understanding and  returned demonstration     HOME EXERCISE PROGRAM: Pt has in his possession         GOALS: Goals reviewed with patient? Yes   SHORT TERM GOALS: Target date: 10 sessions   Patient will complete loud /a/ or alternative ("Hey!") average >80 dB with rare min cues. Baseline: 65 dB; 09/19/22: discontinued due to reduced carryover with technique (strain) Goal status: DISCONTINUED   2.  Patient will read phrases average >72 dB 90% acc with modified independence. Baseline:  09/19/22: 75dB avg 100% acc Goal status: MET   3.  Patient will generate spontaneous responses (sentence level) >72 dB 80% acc with rare min cues. Baseline:  63 dB; 09/19/22: 73 dB with min cues Goal status: MET   09/19/22: STG =LTG     LONG TERM GOALS: Target date: 11/07/2022   Patient will complete basic voice home routine >80% acc with modified independence. Baseline:  Goal status: ON-GOING   2.  Patient will maintain intelligibility >85%, intensity >72 dB in 8-10 minutes simple conversation x 3 visits. Baseline:  Goal status: ON-GOING   3.  Patient will report improved voice-related quality of life as measured by V-RQOL. Baseline: 27/50 (fair) Goal status: ONGOING       ASSESSMENT:  CLINICAL IMPRESSION: Patient presents with reduced vocal intensity, hoarse, breathy vocal quality, and mild vocal tremor. Vocal fold bowing noted per ENT, however pt also has history of parkinsonism, and demonstrates signs of hypokinetic dysarthria. Patient able to improve vocal quality and intelligibility using intensity-based cues and is beginning to carry this over to structured conversation. Pt demonstrating improved consistency with simplified HEP, however with more complex tasks has difficulty with new learning and demonstrates minimal carryover between sessions (requiring same education and cuing at beginning of each session) due to baseline memory impairments. Spouse attended one session at [facility] request and pt  reports she is providing cues at home. Anticipate pt will require ongoing cues and support from communication partner to maintain functional gains in vocal quality and intelligibility outside of ST. Continue skilled ST to improve vocal quality for improved communication at home.    OBJECTIVE IMPAIRMENTS include dysarthria and voice disorder. These impairments are limiting patient from effectively communicating at home and in community. Factors affecting potential to achieve goals and functional outcome are ability to learn/carryover information.. Patient will benefit from skilled SLP services to address above impairments and improve overall function.   REHAB POTENTIAL: Fair - good  , improves with decreased cognitive demand; pt has difficulty with carryover   PLAN: SLP FREQUENCY: 2x/week   SLP DURATION: 8 weeks PLANNED INTERVENTIONS: Cueing hierachy, Functional tasks, SLP instruction and feedback, Compensatory strategies, and Patient/family education  Deneise Lever, MS, CCC-SLP Speech-Language Pathologist [phone]   Aliene Altes, [place] 09/19/2022, 2:09 PM  [place] MAIN [facility] SERVICES [address] [place], [place], [address] Phone: [phone]   Fax:  [phone]

## 2022-09-25 ENCOUNTER — Ambulatory Visit (INDEPENDENT_AMBULATORY_CARE_PROVIDER_SITE_OTHER): Payer: Medicare Other | Admitting: Podiatry

## 2022-09-25 ENCOUNTER — Encounter: Payer: Medicare Other | Admitting: Speech Pathology

## 2022-09-25 ENCOUNTER — Telehealth: Payer: Self-pay | Admitting: *Deleted

## 2022-09-25 DIAGNOSIS — L97521 Non-pressure chronic ulcer of other part of left foot limited to breakdown of skin: Secondary | ICD-10-CM

## 2022-09-25 NOTE — Progress Notes (Signed)
Subjective:  Patient ID: [name]., male    DOB: [birth year],  MRN: [id number]  Chief Complaint  Patient presents with   Toe Pain    Left foot toe ulcer     88 y.o. male presents with the above complaint.  Patient presents with left third digit ulceration limited to the breakdown of the skin.  Patient states that he was seeing Dr. Milinda Pointer who put him on antibiotics and was doing dressing changes seems to help pretty good amount he does have neuropathy.  He will is to make sure that it is healing well.  He has completed his course of antibiotics.  He would like to discuss next treatment plan   Review of Systems: Negative except as noted in the HPI. Denies N/V/F/Ch.  Past Medical History:  Diagnosis Date   Acute embolic stroke ([place]) 08/07/6386   BPH (benign prostatic hyperplasia)    Bradycardia 11/10/2015   Coronary artery disease    Dizziness 08/21/2012   DYSPEPSIA 07/31/2010   Qualifier: Diagnosis of  By: Grandville Silos RN, Caryl Pina     Hyperlipidemia 05/16/2010   Qualifier: Diagnosis of  By: [name], [name]      Hypertension    HYPERTENSION, BENIGN 05/16/2010   Qualifier: Diagnosis of  By: [name], [name]     Stenosis of right carotid artery 02/01/2017    Current Outpatient Medications:    acetaminophen (TYLENOL) 650 MG CR tablet, Take 650 mg by mouth every 8 (eight) hours as needed. , Disp: , Rfl:    Ascorbic Acid (VITAMIN C) 1000 MG tablet, Take 1,000 mg by mouth daily., Disp: , Rfl:    aspirin 81 MG tablet, Take 1 tablet (81 mg total) by mouth daily., Disp: 30 tablet, Rfl:    b complex vitamins tablet, Take 1 tablet by mouth daily., Disp: , Rfl:    baclofen (LIORESAL) 20 MG tablet, Take 20 mg by mouth 2 (two) times daily., Disp: , Rfl:    cholecalciferol (VITAMIN D) 1000 units tablet, Take 2,000 Units by mouth daily., Disp: , Rfl:    clindamycin (CLEOCIN) 150 MG capsule, Take 1 capsule (150 mg total) by mouth 3 (three) times daily., Disp: 30 capsule, Rfl: 1   cyanocobalamin  (VITAMIN B12) 1000 MCG tablet, Take 1,000 mcg by mouth daily., Disp: , Rfl:    ezetimibe (ZETIA) 10 MG tablet, Take 1 tablet (10 mg total) by mouth daily., Disp: 90 tablet, Rfl: 3   FLAXSEED, LINSEED, PO, Take 2 g by mouth daily., Disp: , Rfl:    lisinopril (ZESTRIL) 20 MG tablet, Take by mouth., Disp: , Rfl:    mupirocin ointment (BACTROBAN) 2 %, Apply 1 Application topically 2 (two) times daily., Disp: 22 g, Rfl: 0   nitroGLYCERIN (NITROSTAT) 0.4 MG SL tablet, Place 1 tablet (0.4 mg total) under the tongue every 5 (five) minutes as needed for chest pain., Disp: 25 tablet, Rfl: 3   pregabalin (LYRICA) 100 MG capsule, TAKE ONE CAPSULE BY MOUTH EVERY MORNING FOR DIABETIC COMPLICATION PERIPHERAL NEUROPATHY, Disp: , Rfl:    pregabalin (LYRICA) 150 MG capsule, Take 150 mg in the am & 100 mg in the pm (Patient not taking: Reported on 08/03/2022), Disp: , Rfl:    pregabalin (LYRICA) 150 MG capsule, TAKE ONE CAPSULE BY MOUTH AT BEDTIME FOR DIABETIC COMPLICATION NOTE: TAKING 100MG  DAILY AND 150MG  AT NIGHT., Disp: , Rfl:    simvastatin (ZOCOR) 40 MG tablet, Take 1 tablet (40 mg total) by mouth every evening., Disp: 90 tablet,  Rfl: 3   tamsulosin (FLOMAX) 0.4 MG CAPS capsule, Take 2 capsules (0.8 mg total) by mouth daily., Disp: 30 capsule, Rfl: 0   zinc sulfate 220 (50 Zn) MG capsule, Take 1 tablet by mouth daily., Disp: , Rfl:   Social History   Tobacco Use  Smoking Status Never  Smokeless Tobacco Never    Allergies  Allergen Reactions   Penicillins Rash    Rash    Rash    Gabapentin    Penicillin G    Codeine Nausea Only and Nausea And Vomiting    Other reaction(s): Vomiting Other reaction(s): Vomiting   Objective:  There were no vitals filed for this visit. There is no height or weight on file to calculate BMI. Constitutional Well developed. Well nourished.  Vascular Dorsalis pedis pulses faint the palpable bilaterally. Posterior tibial pulses faint the palpable  bilaterally. Capillary refill limb-to all digits.  No cyanosis or clubbing noted. Pedal hair growth normal.  Neurologic Normal speech. Oriented to person, place, and time. Epicritic sensation to light touch grossly present bilaterally.  Dermatologic Left third digit ulceration limited to the breakdown of the skin.  It is superficial.  There is no redness present.  No clinical signs of infection no purulent drainage does not probe down to bone.  Orthopedic: Normal joint ROM without pain or crepitus bilaterally. No visible deformities. No bony tenderness.   Radiographs: None Assessment:  No diagnosis found. Plan:  Patient was evaluated and treated and all questions answered.  Left third digit ulceration limited to the breakdown of the skin -No debridement was necessary as this mostly granular in nature -Encouraged him to continue wearing surgical shoe to help offload the toe -I will transition him to Betadine wet-to-dry dressing as it will help dry the skin and heal it faster. -No further antibiotics are indicated as it is not clinically infected.  He is a high risk of losing the toe. -If there is no improvement in the toe we will plan on doing further work-up and neurovascular status of the patient  No follow-ups on file.

## 2022-09-25 NOTE — Telephone Encounter (Signed)
Called and spoke with wife giving instructions per provider.

## 2022-09-25 NOTE — Telephone Encounter (Signed)
Patient is calling for advise on whether or not he can walk or exercise or wait until ulcer heals.Please advise.

## 2022-09-26 ENCOUNTER — Encounter: Payer: Medicare Other | Admitting: Speech Pathology

## 2022-09-26 ENCOUNTER — Telehealth: Payer: Self-pay | Admitting: Podiatry

## 2022-09-26 NOTE — Telephone Encounter (Signed)
Pts wife called stating she was holding for someone to see if they could get pt seen tomorrow and it hung up.Pts wife called  back stating pt was seen yesterday by Dr Posey Pronto in [place] and they are following the instructions given but now pts toe is hurting and they are going out of town for a week  over the weekend and she would like to get the toe looked at before they leave to make sure everything is ok. She is worried he may lose the toe. I scheduled them to see Dr Posey Pronto in [place] 10.26.2023

## 2022-09-27 ENCOUNTER — Ambulatory Visit: Payer: Medicare Other | Admitting: Speech Pathology

## 2022-09-27 ENCOUNTER — Ambulatory Visit (INDEPENDENT_AMBULATORY_CARE_PROVIDER_SITE_OTHER): Payer: Medicare Other | Admitting: Podiatry

## 2022-09-27 DIAGNOSIS — L97521 Non-pressure chronic ulcer of other part of left foot limited to breakdown of skin: Secondary | ICD-10-CM | POA: Diagnosis not present

## 2022-09-27 DIAGNOSIS — R498 Other voice and resonance disorders: Secondary | ICD-10-CM

## 2022-09-27 MED ORDER — DOXYCYCLINE HYCLATE 100 MG PO TABS: 100.0000 mg | ORAL_TABLET | Freq: Two times a day (BID) | ORAL | 0 refills | Status: AC

## 2022-09-27 NOTE — Therapy (Signed)
Patient Details  Name: [name]. MRN: [id number] Date of Birth: Jul 14, 1933  Today's Date: 09/27/2022 Time: 5374-8270   Visit#: 9 of 17 Re-eval: 11/07/22   OUTPATIENT SPEECH LANGUAGE PATHOLOGY TREATMENT NOTE   Patient Name: [name]. MRN: [id number] DOB:[birth year], 88 y.o., male Today's Date: 09/27/2022  PCP: Fulton Reek, MD REFERRING PROVIDER: Dr. Tami Ribas    END OF SESSION:   End of Session - 09/27/22 1307     Visit Number 11    Number of Visits 17    Date for SLP Re-Evaluation 11/07/22    SLP Start Time 1305    SLP Stop Time  1400    SLP Time Calculation (min) 55 min    Activity Tolerance Patient tolerated treatment well             Past Medical History:  Diagnosis Date   Acute embolic stroke ([place]) 0/12/69   BPH (benign prostatic hyperplasia)    Bradycardia 11/10/2015   Coronary artery disease    Dizziness 08/21/2012   DYSPEPSIA 07/31/2010   Qualifier: Diagnosis of  By: Grandville Silos RN, Caryl Pina     Hyperlipidemia 05/16/2010   Qualifier: Diagnosis of  By: [name], [name]      Hypertension    HYPERTENSION, BENIGN 05/16/2010   Qualifier: Diagnosis of  By: [name], [name]     Stenosis of right carotid artery 02/01/2017   Past Surgical History:  Procedure Laterality Date   HIP SURGERY     right hip    Intestinal Blockage     LOOP RECORDER INSERTION N/A 05/14/2017   Procedure: Loop Recorder Insertion;  Surgeon: [name], MD;  Location: [place] [facility];  Service: Cardiovascular;  Laterality: N/A;   SKIN CANCER EXCISION     x2   TONSILLECTOMY     Patient Active Problem List   Diagnosis Date Noted   Abnormal CXR (chest x-ray) 09/12/2022   ASCVD (arteriosclerotic cardiovascular disease) 09/12/2022   Benign prostatic hyperplasia 09/12/2022   DDD (degenerative disc disease), cervical 09/12/2022   ED (erectile dysfunction) 09/12/2022   Encounter for fitting and adjustment of hearing aid 09/12/2022   History of [facility] spotted fever 09/12/2022   Intention tremor 09/12/2022   Lower GI bleed 09/12/2022   Other ill-defined and unknown causes of morbidity and mortality 09/12/2022   Counseling, unspecified 09/12/2022   Sensorineural hearing loss, bilateral 09/12/2022   Small bowel obstruction ([place]) 09/12/2022   Low testosterone 07/06/2020   Hx of completed stroke 02/28/2020   Cerebral infarction ([place]) 05/10/2019   Exposure to Agent [facility] 05/10/2019   Mononeuritis 05/10/2019   Closed fracture of right hip with routine healing 02/06/2019   Dementia without behavioral disturbance ([place]) 01/06/2019   Femoral neck fracture ([place]) 10/25/2018   Stenosis of right carotid artery 21/97/5883   Acute embolic stroke ([place]) 25/49/8264   Fever 01/07/2017   Pneumonia of right middle lobe due to infectious organism 01/07/2017   Nontraumatic subcortical hemorrhage of left cerebral hemisphere ([place]) 01/06/2017   Parkinsonism 12/27/2016   Unstable balance 12/20/2015   Bradycardia 11/10/2015   Lumbar radiculitis 11/01/2015   Lumbar stenosis with neurogenic claudication 11/01/2015   Osteoarthritis of spine with radiculopathy, lumbar region 11/01/2015   Essential tremor 06/03/2015   Pseudophakia, left eye 01/12/2015   Dizziness 08/21/2012   AK (actinic keratosis) 03/18/2012   History of nonmelanoma skin cancer 03/18/2012   Syncope 04/10/2011   CALF PAIN, LEFT 10/11/2010   DYSPEPSIA 07/31/2010   CHEST DISCOMFORT 07/31/2010  Hyperlipidemia 05/16/2010   HYPERTENSION, BENIGN 05/16/2010   CAD, NATIVE VESSEL 05/16/2010      Onset date: 08/02/2022   REFERRING DIAG: Hoarseness   THERAPY DIAG:  Other voice and resonance disorders   Dysarthria and anarthria   Rationale for Evaluation and Treatment Rehabilitation   SUBJECTIVE:    SUBJECTIVE STATEMENT: "I've got this stuff here."   PERTINENT HISTORY: Patient is an 88 y.o. male with past medical history that includes HTN, HLD, CVA, parkinsonism who completed  prior course of ST for aphasia in 2018. Referred to ENT by [name], to whom he reported intermittent voice change. Pt was evaluated by Dr. Tami Ribas who noted vocal fold bowing. Pt presents for evaluation for hoarseness.      DIAGNOSTIC FINDINGS: Laryngoscopy on 08/02/22: "mild vocal cord bowing, no paralysis or mass noted." Moderate generalized cerebral atrophy, comparatively mild cerebellar atrophy, and prior left temporoparietal infarct noted per MRI brain 12/13/2021.   PAIN:  Are you having pain? No     PATIENT GOALS: Improve voice   OBJECTIVE:    TODAY'S TREATMENT:  Pt initially with hoarseness today when greeting SLP. When using pt's HEP to build loudness, noted habitually higher pitch than typical, average 241 Hz. Due to difficulties with cognitive load, utilizing modeling for pitch to bring pt to more normative pitch, which also improved vocal quality. Average with personally relevant phrases (modeling/imitation) 200 Hz, 78 dB. With automatic speech tasks (no model) pt averaged 213 Hz, 75 dB. Pt maintained dB above 73 in conversation, pitch range 162-239 Hz, with SLP recasting and modeling pt's utterances at times to lower pitch.   PATIENT EDUCATION: Education details: HEP, appropriate loudness Person educated: Patient Education method: Explanation, Demonstration, and auditory feedback Education comprehension: verbalized understanding and returned demonstration     HOME EXERCISE PROGRAM: Pt has in his possession         GOALS: Goals reviewed with patient? Yes   SHORT TERM GOALS: Target date: 10 sessions   Patient will complete loud /a/ or alternative ("Hey!") average >80 dB with rare min cues. Baseline: 65 dB; 09/19/22: discontinued due to reduced carryover with technique (strain) Goal status: DISCONTINUED   2.  Patient will read phrases average >72 dB 90% acc with modified independence. Baseline:  09/19/22: 75dB avg 100% acc Goal status: MET   3.  Patient will  generate spontaneous responses (sentence level) >72 dB 80% acc with rare min cues. Baseline:  63 dB; 09/19/22: 73 dB with min cues Goal status: MET   09/19/22: STG =LTG     LONG TERM GOALS: Target date: 11/07/2022   Patient will complete basic voice home routine >80% acc with modified independence. Baseline:  Goal status: ON-GOING   2.  Patient will maintain intelligibility >85%, intensity >72 dB in 8-10 minutes simple conversation x 3 visits. Baseline:  Goal status: ON-GOING   3.  Patient will report improved voice-related quality of life as measured by V-RQOL. Baseline: 27/50 (fair) Goal status: ONGOING       ASSESSMENT:   CLINICAL IMPRESSION: Patient presents with reduced vocal intensity, hoarse, breathy vocal quality, and mild vocal tremor. Patient able to improve vocal quality and intelligibility using intensity-based cues and is beginning to carry this over to structured conversation. Using habitually higher pitch than usual today, but improves with model. Pt demonstrating improved consistency with simplified HEP, however with more complex tasks has difficulty with new learning and demonstrates minimal carryover between sessions (requiring same education and cuing at beginning of each session)  due to baseline memory impairments. Spouse attended one session at [facility] request and pt reports she is providing cues at home. Anticipate pt will require ongoing cues and support from communication partner to maintain functional gains in vocal quality and intelligibility outside of ST. Continue skilled ST to improve vocal quality for improved communication at home.    OBJECTIVE IMPAIRMENTS include dysarthria and voice disorder. These impairments are limiting patient from effectively communicating at home and in community. Factors affecting potential to achieve goals and functional outcome are ability to learn/carryover information.. Patient will benefit from skilled SLP services to address  above impairments and improve overall function.   REHAB POTENTIAL: Fair - good  , improves with decreased cognitive demand; pt has difficulty with carryover   PLAN: SLP FREQUENCY: 2x/week   SLP DURATION: 8 weeks PLANNED INTERVENTIONS: Cueing hierachy, Functional tasks, SLP instruction and feedback, Compensatory strategies, and Patient/family education  Deneise Lever, MS, CCC-SLP Speech-Language Pathologist [phone]   Aliene Altes, [place] 09/27/2022, 1:08 PM  [place] MAIN [facility] SERVICES [address] [place], [place], [address] Phone: [phone]   Fax:  [phone]

## 2022-09-27 NOTE — Progress Notes (Signed)
Subjective:  Patient ID: Calvin Amber., male    DOB: November 12, 1933,  MRN: [id number]  Chief Complaint  Patient presents with   Toe Pain    88 y.o. male presents with the above complaint.  Patient [name] for follow-up of left third digit ulceration limited to the breakdown of the skin.  He was having a little bit more pain they wanted to get it eval before they go on the trip.  There does not appear to be any new changes.  No signs of infection no systemic signs of infection   Review of Systems: Negative except as noted in the HPI. Denies N/V/F/Ch.  Past Medical History:  Diagnosis Date   Acute embolic stroke (HCC) 02/01/2017   BPH (benign prostatic hyperplasia)    Bradycardia 11/10/2015   Coronary artery disease    Dizziness 08/21/2012   DYSPEPSIA 07/31/2010   Qualifier: Diagnosis of  By: Janee Morn RN, Morrie Sheldon     Hyperlipidemia 05/16/2010   Qualifier: Diagnosis of  By: Mariah Milling MD, [name]      Hypertension    HYPERTENSION, BENIGN 05/16/2010   Qualifier: Diagnosis of  By: Mariah Milling MD, [name]     Stenosis of right carotid artery 02/01/2017    Current Outpatient Medications:    doxycycline (VIBRA-TABS) 100 MG tablet, Take 1 tablet (100 mg total) by mouth 2 (two) times daily., Disp: 60 tablet, Rfl: 0   acetaminophen (TYLENOL) 650 MG CR tablet, Take 650 mg by mouth every 8 (eight) hours as needed. , Disp: , Rfl:    Ascorbic Acid (VITAMIN C) 1000 MG tablet, Take 1,000 mg by mouth daily., Disp: , Rfl:    aspirin 81 MG tablet, Take 1 tablet (81 mg total) by mouth daily., Disp: 30 tablet, Rfl:    b complex vitamins tablet, Take 1 tablet by mouth daily., Disp: , Rfl:    baclofen (LIORESAL) 20 MG tablet, Take 20 mg by mouth 2 (two) times daily., Disp: , Rfl:    cholecalciferol (VITAMIN D) 1000 units tablet, Take 2,000 Units by mouth daily., Disp: , Rfl:    clindamycin (CLEOCIN) 150 MG capsule, Take 1 capsule (150 mg total) by mouth 3 (three) times daily., Disp: 30 capsule, Rfl: 1   cyanocobalamin  (VITAMIN B12) 1000 MCG tablet, Take 1,000 mcg by mouth daily., Disp: , Rfl:    ezetimibe (ZETIA) 10 MG tablet, Take 1 tablet (10 mg total) by mouth daily., Disp: 90 tablet, Rfl: 3   FLAXSEED, LINSEED, PO, Take 2 g by mouth daily., Disp: , Rfl:    lisinopril (ZESTRIL) 20 MG tablet, Take by mouth., Disp: , Rfl:    mupirocin ointment (BACTROBAN) 2 %, Apply 1 Application topically 2 (two) times daily., Disp: 22 g, Rfl: 0   nitroGLYCERIN (NITROSTAT) 0.4 MG SL tablet, Place 1 tablet (0.4 mg total) under the tongue every 5 (five) minutes as needed for chest pain., Disp: 25 tablet, Rfl: 3   pregabalin (LYRICA) 100 MG capsule, TAKE ONE CAPSULE BY MOUTH EVERY MORNING FOR DIABETIC COMPLICATION PERIPHERAL NEUROPATHY, Disp: , Rfl:    pregabalin (LYRICA) 150 MG capsule, Take 150 mg in the am & 100 mg in the pm (Patient not taking: Reported on 08/03/2022), Disp: , Rfl:    pregabalin (LYRICA) 150 MG capsule, TAKE ONE CAPSULE BY MOUTH AT BEDTIME FOR DIABETIC COMPLICATION NOTE: TAKING 100MG  DAILY AND 150MG  AT NIGHT., Disp: , Rfl:    simvastatin (ZOCOR) 40 MG tablet, Take 1 tablet (40 mg total) by mouth every evening., Disp: 90  tablet, Rfl: 3   tamsulosin (FLOMAX) 0.4 MG CAPS capsule, Take 2 capsules (0.8 mg total) by mouth daily., Disp: 30 capsule, Rfl: 0   zinc sulfate 220 (50 Zn) MG capsule, Take 1 tablet by mouth daily., Disp: , Rfl:   Social History   Tobacco Use  Smoking Status Never  Smokeless Tobacco Never    Allergies  Allergen Reactions   Penicillins Rash    Rash    Rash    Gabapentin    Penicillin G    Codeine Nausea Only and Nausea And Vomiting    Other reaction(s): Vomiting Other reaction(s): Vomiting   Objective:  There were no vitals filed for this visit. There is no height or weight on file to calculate BMI. Constitutional Well developed. Well nourished.  Vascular Dorsalis pedis pulses faint the palpable bilaterally. Posterior tibial pulses faint the palpable  bilaterally. Capillary refill limb-to all digits.  No cyanosis or clubbing noted. Pedal hair growth normal.  Neurologic Normal speech. Oriented to person, place, and time. Epicritic sensation to light touch grossly present bilaterally.  Dermatologic Left third digit ulceration limited to the breakdown of the skin.  It is superficial.  There is no redness present.  No clinical signs of infection no purulent drainage does not probe down to bone.  Orthopedic: Normal joint ROM without pain or crepitus bilaterally. No visible deformities. No bony tenderness.   Radiographs: None Assessment:  No diagnosis found. Plan:  Patient was evaluated and treated and all questions answered.  Left third digit ulceration limited to the breakdown of the skin -No debridement was necessary as this mostly granular in nature -Encouraged him to continue wearing surgical shoe to help offload the toe -I will transition him to Betadine wet-to-dry dressing as it will help dry the skin and heal it faster. -I will place him on doxycycline for 30 days to be on the safe side as he is going away on a trip. -If there is no improvement in the toe we will plan on doing further work-up and neurovascular status of the patient  No follow-ups on file.

## 2022-10-01 ENCOUNTER — Encounter: Payer: Medicare Other | Admitting: Speech Pathology

## 2022-10-02 ENCOUNTER — Encounter: Payer: Medicare Other | Admitting: Speech Pathology

## 2022-10-03 ENCOUNTER — Encounter: Payer: Medicare Other | Admitting: Speech Pathology

## 2022-10-09 ENCOUNTER — Ambulatory Visit: Payer: Medicare Other | Attending: Unknown Physician Specialty | Admitting: Speech Pathology

## 2022-10-09 DIAGNOSIS — R498 Other voice and resonance disorders: Secondary | ICD-10-CM | POA: Insufficient documentation

## 2022-10-09 DIAGNOSIS — R471 Dysarthria and anarthria: Secondary | ICD-10-CM | POA: Diagnosis not present

## 2022-10-09 NOTE — Therapy (Unsigned)
Patient Details  Name: [name]. MRN: [id number] Date of Birth: 21-Sep-1933  Today's Date: 10/09/2022 Time: 3220-2542   Visit#: 9 of 17 Re-eval: 11/07/22   OUTPATIENT SPEECH LANGUAGE PATHOLOGY TREATMENT NOTE   Patient Name: [name]. MRN: [id number] DOB:07-Feb-1933, 88 y.o., male Today's Date: 10/09/2022  PCP: Fulton Reek, MD REFERRING PROVIDER: Dr. Tami Ribas    END OF SESSION:   End of Session - 10/09/22 1502     Visit Number 12    Number of Visits 17    Date for SLP Re-Evaluation 11/07/22    SLP Start Time 1500    SLP Stop Time  1600    SLP Time Calculation (min) 60 min    Activity Tolerance Patient tolerated treatment well             Past Medical History:  Diagnosis Date   Acute embolic stroke ([place]) 02/01/5175   BPH (benign prostatic hyperplasia)    Bradycardia 11/10/2015   Coronary artery disease    Dizziness 08/21/2012   DYSPEPSIA 07/31/2010   Qualifier: Diagnosis of  By: Grandville Silos RN, Caryl Pina     Hyperlipidemia 05/16/2010   Qualifier: Diagnosis of  By: [name], [name]      Hypertension    HYPERTENSION, BENIGN 05/16/2010   Qualifier: Diagnosis of  By: [name], [name]     Stenosis of right carotid artery 02/01/2017   Past Surgical History:  Procedure Laterality Date   HIP SURGERY     right hip    Intestinal Blockage     LOOP RECORDER INSERTION N/A 05/14/2017   Procedure: Loop Recorder Insertion;  Surgeon: [name], MD;  Location: [place] [facility];  Service: Cardiovascular;  Laterality: N/A;   SKIN CANCER EXCISION     x2   TONSILLECTOMY     Patient Active Problem List   Diagnosis Date Noted   Abnormal CXR (chest x-ray) 09/12/2022   ASCVD (arteriosclerotic cardiovascular disease) 09/12/2022   Benign prostatic hyperplasia 09/12/2022   DDD (degenerative disc disease), cervical 09/12/2022   ED (erectile dysfunction) 09/12/2022   Encounter for fitting and adjustment of hearing aid 09/12/2022   History of [facility] spotted fever 09/12/2022   Intention tremor 09/12/2022   Lower GI bleed 09/12/2022   Other ill-defined and unknown causes of morbidity and mortality 09/12/2022   Counseling, unspecified 09/12/2022   Sensorineural hearing loss, bilateral 09/12/2022   Small bowel obstruction ([place]) 09/12/2022   Low testosterone 07/06/2020   Hx of completed stroke 02/28/2020   Cerebral infarction ([place]) 05/10/2019   Exposure to Agent [facility] 05/10/2019   Mononeuritis 05/10/2019   Closed fracture of right hip with routine healing 02/06/2019   Dementia without behavioral disturbance ([place]) 01/06/2019   Femoral neck fracture ([place]) 10/25/2018   Stenosis of right carotid artery 16/06/3709   Acute embolic stroke ([place]) 62/69/4854   Fever 01/07/2017   Pneumonia of right middle lobe due to infectious organism 01/07/2017   Nontraumatic subcortical hemorrhage of left cerebral hemisphere ([place]) 01/06/2017   Parkinsonism 12/27/2016   Unstable balance 12/20/2015   Bradycardia 11/10/2015   Lumbar radiculitis 11/01/2015   Lumbar stenosis with neurogenic claudication 11/01/2015   Osteoarthritis of spine with radiculopathy, lumbar region 11/01/2015   Essential tremor 06/03/2015   Pseudophakia, left eye 01/12/2015   Dizziness 08/21/2012   AK (actinic keratosis) 03/18/2012   History of nonmelanoma skin cancer 03/18/2012   Syncope 04/10/2011   CALF PAIN, LEFT 10/11/2010   DYSPEPSIA 07/31/2010   CHEST DISCOMFORT 07/31/2010  Hyperlipidemia 05/16/2010   HYPERTENSION, BENIGN 05/16/2010   CAD, NATIVE VESSEL 05/16/2010      Onset date: 08/02/2022   REFERRING DIAG: Hoarseness   THERAPY DIAG:  Other voice and resonance disorders   Dysarthria and anarthria   Rationale for Evaluation and Treatment Rehabilitation   SUBJECTIVE:    SUBJECTIVE STATEMENT: "I noticed people were talking louder than me. It made me think about it and try to get it up."   PERTINENT HISTORY: Patient is an 88 y.o. male with past  medical history that includes HTN, HLD, CVA, parkinsonism who completed prior course of ST for aphasia in 2018. Referred to ENT by [name], to whom he reported intermittent voice change. Pt was evaluated by Dr. Tami Ribas who noted vocal fold bowing. Pt presents for evaluation for hoarseness.      DIAGNOSTIC FINDINGS: Laryngoscopy on 08/02/22: "mild vocal cord bowing, no paralysis or mass noted." Moderate generalized cerebral atrophy, comparatively mild cerebellar atrophy, and prior left temporoparietal infarct noted per MRI brain 12/13/2021.   PAIN:  Are you having pain? Yes, back 3/10     PATIENT GOALS: Improve voice   OBJECTIVE:    TODAY'S TREATMENT:  Patient completed HEP average 76 dB with rare min cues.    PATIENT EDUCATION: Education details: HEP, appropriate loudness Person educated: Patient Education method: Explanation, Demonstration, and auditory feedback Education comprehension: verbalized understanding and returned demonstration     GOALS: Goals reviewed with patient? Yes   SHORT TERM GOALS: Target date: 10 sessions   Patient will complete loud /a/ or alternative ("Hey!") average >80 dB with rare min cues. Baseline: 65 dB; 09/19/22: discontinued due to reduced carryover with technique (strain) Goal status: DISCONTINUED   2.  Patient will read phrases average >72 dB 90% acc with modified independence. Baseline:  09/19/22: 75dB avg 100% acc Goal status: MET   3.  Patient will generate spontaneous responses (sentence level) >72 dB 80% acc with rare min cues. Baseline:  63 dB; 09/19/22: 73 dB with min cues Goal status: MET   09/19/22: STG =LTG     LONG TERM GOALS: Target date: 11/07/2022   Patient will complete basic voice home routine >80% acc with modified independence. Baseline:  Goal status: ON-GOING   2.  Patient will maintain intelligibility >85%, intensity >72 dB in 8-10 minutes simple conversation x 3 visits. Baseline:  Goal status: ON-GOING   3.   Patient will report improved voice-related quality of life as measured by V-RQOL. Baseline: 27/50 (fair) Goal status: ONGOING       ASSESSMENT:   CLINICAL IMPRESSION: Patient presents with reduced vocal intensity, hoarse, breathy vocal quality, and mild vocal tremor. Patient able to improve vocal quality and intelligibility using intensity-based cues and is beginning to carry this over to structured conversation. Using habitually higher pitch than usual today, but improves with model. Pt demonstrating improved consistency with simplified HEP, however with more complex tasks has difficulty with new learning and demonstrates minimal carryover between sessions (requiring same education and cuing at beginning of each session) due to baseline memory impairments. Spouse attended one session at [facility] request and pt reports she is providing cues at home. Anticipate pt will require ongoing cues and support from communication partner to maintain functional gains in vocal quality and intelligibility outside of ST. Continue skilled ST to improve vocal quality for improved communication at home.    OBJECTIVE IMPAIRMENTS include dysarthria and voice disorder. These impairments are limiting patient from effectively communicating at home and in community.  Factors affecting potential to achieve goals and functional outcome are ability to learn/carryover information.. Patient will benefit from skilled SLP services to address above impairments and improve overall function.   REHAB POTENTIAL: Fair - good  , improves with decreased cognitive demand; pt has difficulty with carryover   PLAN: SLP FREQUENCY: 2x/week   SLP DURATION: 8 weeks PLANNED INTERVENTIONS: Cueing hierachy, Functional tasks, SLP instruction and feedback, Compensatory strategies, and Patient/family education  Deneise Lever, MS, CCC-SLP Speech-Language Pathologist [phone]   Aliene Altes, [place] 10/09/2022, 3:03 PM  [place] MAIN [facility] [address] [place], [place], [address] Phone: [phone]   Fax:  [phone]

## 2022-10-11 ENCOUNTER — Ambulatory Visit: Payer: Medicare Other | Admitting: Speech Pathology

## 2022-10-11 DIAGNOSIS — R471 Dysarthria and anarthria: Secondary | ICD-10-CM | POA: Diagnosis not present

## 2022-10-11 DIAGNOSIS — R498 Other voice and resonance disorders: Secondary | ICD-10-CM

## 2022-10-11 NOTE — Therapy (Signed)
OUTPATIENT SPEECH LANGUAGE PATHOLOGY TREATMENT NOTE   Patient Name: [name]. MRN: [id number] DOB:[birth year], 88 y.o., male Today's Date: 10/11/2022  PCP: Fulton Reek, MD REFERRING PROVIDER: Dr. Tami Ribas    END OF SESSION:   End of Session - 10/11/22 1403     Visit Number 13    Number of Visits 17    Date for SLP Re-Evaluation 11/07/22    SLP Start Time 93    SLP Stop Time  1500    SLP Time Calculation (min) 60 min    Activity Tolerance Patient tolerated treatment well             Past Medical History:  Diagnosis Date   Acute embolic stroke ([place]) 02/05/6814   BPH (benign prostatic hyperplasia)    Bradycardia 11/10/2015   Coronary artery disease    Dizziness 08/21/2012   DYSPEPSIA 07/31/2010   Qualifier: Diagnosis of  By: Grandville Silos RN, Caryl Pina     Hyperlipidemia 05/16/2010   Qualifier: Diagnosis of  By: [name], [name]      Hypertension    HYPERTENSION, BENIGN 05/16/2010   Qualifier: Diagnosis of  By: [name], [name]     Stenosis of right carotid artery 02/01/2017   Past Surgical History:  Procedure Laterality Date   HIP SURGERY     right hip    Intestinal Blockage     LOOP RECORDER INSERTION N/A 05/14/2017   Procedure: Loop Recorder Insertion;  Surgeon: [name], MD;  Location: [place] [facility];  Service: Cardiovascular;  Laterality: N/A;   SKIN CANCER EXCISION     x2   TONSILLECTOMY     Patient Active Problem List   Diagnosis Date Noted   Abnormal CXR (chest x-ray) 09/12/2022   ASCVD (arteriosclerotic cardiovascular disease) 09/12/2022   Benign prostatic hyperplasia 09/12/2022   DDD (degenerative disc disease), cervical 09/12/2022   ED (erectile dysfunction) 09/12/2022   Encounter for fitting and adjustment of hearing aid 09/12/2022   History of [facility] spotted fever 09/12/2022   Intention tremor 09/12/2022   Lower GI bleed 09/12/2022   Other ill-defined and unknown causes of morbidity and mortality 09/12/2022    Counseling, unspecified 09/12/2022   Sensorineural hearing loss, bilateral 09/12/2022   Small bowel obstruction ([place]) 09/12/2022   Low testosterone 07/06/2020   Hx of completed stroke 02/28/2020   Cerebral infarction ([place]) 05/10/2019   Exposure to Agent [facility] 05/10/2019   Mononeuritis 05/10/2019   Closed fracture of right hip with routine healing 02/06/2019   Dementia without behavioral disturbance ([place]) 01/06/2019   Femoral neck fracture ([place]) 10/25/2018   Stenosis of right carotid artery 94/70/7615   Acute embolic stroke ([place]) 18/34/3735   Fever 01/07/2017   Pneumonia of right middle lobe due to infectious organism 01/07/2017   Nontraumatic subcortical hemorrhage of left cerebral hemisphere ([place]) 01/06/2017   Parkinsonism 12/27/2016   Unstable balance 12/20/2015   Bradycardia 11/10/2015   Lumbar radiculitis 11/01/2015   Lumbar stenosis with neurogenic claudication 11/01/2015   Osteoarthritis of spine with radiculopathy, lumbar region 11/01/2015   Essential tremor 06/03/2015   Pseudophakia, left eye 01/12/2015   Dizziness 08/21/2012   AK (actinic keratosis) 03/18/2012   History of nonmelanoma skin cancer 03/18/2012   Syncope 04/10/2011   CALF PAIN, LEFT 10/11/2010   DYSPEPSIA 07/31/2010   CHEST DISCOMFORT 07/31/2010   Hyperlipidemia 05/16/2010   HYPERTENSION, BENIGN 05/16/2010   CAD, NATIVE VESSEL 05/16/2010      Onset date: 08/02/2022   REFERRING DIAG:  Hoarseness   THERAPY DIAG:  Other voice and resonance disorders   Dysarthria and anarthria   Rationale for Evaluation and Treatment Rehabilitation   SUBJECTIVE:    SUBJECTIVE STATEMENT: "There's that stuff again... but I have to fight through it."   PERTINENT HISTORY: Patient is an 88 y.o. male with past medical history that includes HTN, HLD, CVA, parkinsonism who completed prior course of ST for aphasia in 2018. Referred to ENT by [name], to whom he reported intermittent voice change. Pt was evaluated by Dr.  Tami Ribas who noted vocal fold bowing. Pt presents for evaluation for hoarseness.      DIAGNOSTIC FINDINGS: Laryngoscopy on 08/02/22: "mild vocal cord bowing, no paralysis or mass noted." Moderate generalized cerebral atrophy, comparatively mild cerebellar atrophy, and prior left temporoparietal infarct noted per MRI brain 12/13/2021.   PAIN:  Are you having pain? No     PATIENT GOALS: Improve voice   OBJECTIVE:    TODAY'S TREATMENT:  Patient spoke with SLP with appropriate loudness when walking down the hall. Maintained intensity in conversation average 74 dB, with rare min cues x3 (voice dipped when pt had difficulty finding the right words). Carryover task/ dual tasking with walking down hallway with cue necessary x 1 to increase loudness.   PATIENT EDUCATION: Education details: HEP, appropriate loudness Person educated: Patient Education method: Explanation, Demonstration, and auditory feedback Education comprehension: verbalized understanding and returned demonstration     GOALS: Goals reviewed with patient? Yes   SHORT TERM GOALS: Target date: 10 sessions   Patient will complete loud /a/ or alternative ("Hey!") average >80 dB with rare min cues. Baseline: 65 dB; 09/19/22: discontinued due to reduced carryover with technique (strain) Goal status: DISCONTINUED   2.  Patient will read phrases average >72 dB 90% acc with modified independence. Baseline:  09/19/22: 75dB avg 100% acc Goal status: MET   3.  Patient will generate spontaneous responses (sentence level) >72 dB 80% acc with rare min cues. Baseline:  63 dB; 09/19/22: 73 dB with min cues Goal status: MET   09/19/22: STG =LTG     LONG TERM GOALS: Target date: 11/07/2022   Patient will complete basic voice home routine >80% acc with modified independence. Baseline:  Goal status: ON-GOING   2.  Patient will maintain intelligibility >85%, intensity >72 dB in 8-10 minutes simple conversation x 3 visits. Baseline:   Goal status: ON-GOING   3.  Patient will report improved voice-related quality of life as measured by V-RQOL. Baseline: 27/50 (fair) Goal status: ONGOING       ASSESSMENT:   CLINICAL IMPRESSION: Patient presents with reduced vocal intensity, hoarse, breathy vocal quality, and mild vocal tremor. Patient able to improve vocal quality and intelligibility using intensity-based cues and is carrying this over to structured conversation; rare min cues necessary with cognitive load. Pt demonstrating improved consistency with simplified HEP, however with more complex tasks has difficulty with new learning and demonstrates minimal carryover between sessions (requiring same education and cuing at beginning of each session) due to baseline memory impairments. Spouse attended one session at [facility] request and pt reports she is providing cues at home. Anticipate pt will require ongoing cues and support from communication partner to maintain functional gains in vocal quality and intelligibility outside of ST. Continue skilled ST for 1-2 more sessions to maximize carryover and to improve vocal quality for improved communication at home.    OBJECTIVE IMPAIRMENTS include dysarthria and voice disorder. These impairments are limiting patient from effectively communicating  at home and in community. Factors affecting potential to achieve goals and functional outcome are ability to learn/carryover information.. Patient will benefit from skilled SLP services to address above impairments and improve overall function.   REHAB POTENTIAL: Fair - good  , improves with decreased cognitive demand; pt has difficulty with carryover   PLAN: SLP FREQUENCY: 2x/week   SLP DURATION: 8 weeks PLANNED INTERVENTIONS: Cueing hierachy, Functional tasks, SLP instruction and feedback, Compensatory strategies, and Patient/family education  Deneise Lever, MS, CCC-SLP Speech-Language Pathologist [phone]   Aliene Altes,  [place] 10/11/2022, 2:04 PM  [place] MAIN [facility] SERVICES [address] [place], [place], [address] Phone: [phone]   Fax:  [phone]

## 2022-10-16 ENCOUNTER — Ambulatory Visit: Payer: Medicare Other | Admitting: Speech Pathology

## 2022-10-16 ENCOUNTER — Ambulatory Visit (INDEPENDENT_AMBULATORY_CARE_PROVIDER_SITE_OTHER): Payer: Medicare Other | Admitting: Podiatry

## 2022-10-16 ENCOUNTER — Ambulatory Visit (INDEPENDENT_AMBULATORY_CARE_PROVIDER_SITE_OTHER): Payer: Medicare Other

## 2022-10-16 DIAGNOSIS — L97521 Non-pressure chronic ulcer of other part of left foot limited to breakdown of skin: Secondary | ICD-10-CM

## 2022-10-16 DIAGNOSIS — R471 Dysarthria and anarthria: Secondary | ICD-10-CM | POA: Diagnosis not present

## 2022-10-16 DIAGNOSIS — R498 Other voice and resonance disorders: Secondary | ICD-10-CM

## 2022-10-16 MED ORDER — DOXYCYCLINE HYCLATE 100 MG PO TABS: 100.0000 mg | ORAL_TABLET | Freq: Two times a day (BID) | ORAL | 0 refills | Status: DC

## 2022-10-16 NOTE — Therapy (Signed)
OUTPATIENT SPEECH LANGUAGE PATHOLOGY TREATMENT NOTE   Patient Name: [name] [facility]. MRN: [id number] DOB:06-Feb-1933, 88 y.o., male Today's Date: 10/16/2022  PCP: Fulton Reek, MD REFERRING PROVIDER: Dr. Tami Ribas    END OF SESSION:   End of Session - 10/16/22 1404     Visit Number 14    Number of Visits 17    Date for SLP Re-Evaluation 11/07/22    SLP Start Time 81    SLP Stop Time  1500    SLP Time Calculation (min) 60 min    Activity Tolerance Patient tolerated treatment well             Past Medical History:  Diagnosis Date   Acute embolic stroke ([place]) 01/10/3150   BPH (benign prostatic hyperplasia)    Bradycardia 11/10/2015   Coronary artery disease    Dizziness 08/21/2012   DYSPEPSIA 07/31/2010   Qualifier: Diagnosis of  By: Grandville Silos RN, Caryl Pina     Hyperlipidemia 05/16/2010   Qualifier: Diagnosis of  By: [name], [name]      Hypertension    HYPERTENSION, BENIGN 05/16/2010   Qualifier: Diagnosis of  By: [name], [name]     Stenosis of right carotid artery 02/01/2017   Past Surgical History:  Procedure Laterality Date   HIP SURGERY     right hip    Intestinal Blockage     LOOP RECORDER INSERTION N/A 05/14/2017   Procedure: Loop Recorder Insertion;  Surgeon: [name], MD;  Location: [facility];  Service: Cardiovascular;  Laterality: N/A;   SKIN CANCER EXCISION     x2   TONSILLECTOMY     Patient Active Problem List   Diagnosis Date Noted   Abnormal CXR (chest x-ray) 09/12/2022   ASCVD (arteriosclerotic cardiovascular disease) 09/12/2022   Benign prostatic hyperplasia 09/12/2022   DDD (degenerative disc disease), cervical 09/12/2022   ED (erectile dysfunction) 09/12/2022   Encounter for fitting and adjustment of hearing aid 09/12/2022   History of [facility] spotted fever 09/12/2022   Intention tremor 09/12/2022   Lower GI bleed 09/12/2022   Other ill-defined and unknown causes of morbidity and mortality 09/12/2022    Counseling, unspecified 09/12/2022   Sensorineural hearing loss, bilateral 09/12/2022   Small bowel obstruction ([place]) 09/12/2022   Low testosterone 07/06/2020   Hx of completed stroke 02/28/2020   Cerebral infarction ([place]) 05/10/2019   Exposure to Agent [facility] 05/10/2019   Mononeuritis 05/10/2019   Closed fracture of right hip with routine healing 02/06/2019   Dementia without behavioral disturbance ([place]) 01/06/2019   Femoral neck fracture ([place]) 10/25/2018   Stenosis of right carotid artery 76/16/0737   Acute embolic stroke ([place]) 10/62/6948   Fever 01/07/2017   Pneumonia of right middle lobe due to infectious organism 01/07/2017   Nontraumatic subcortical hemorrhage of left cerebral hemisphere ([place]) 01/06/2017   Parkinsonism 12/27/2016   Unstable balance 12/20/2015   Bradycardia 11/10/2015   Lumbar radiculitis 11/01/2015   Lumbar stenosis with neurogenic claudication 11/01/2015   Osteoarthritis of spine with radiculopathy, lumbar region 11/01/2015   Essential tremor 06/03/2015   Pseudophakia, left eye 01/12/2015   Dizziness 08/21/2012   AK (actinic keratosis) 03/18/2012   History of nonmelanoma skin cancer 03/18/2012   Syncope 04/10/2011   CALF PAIN, LEFT 10/11/2010   DYSPEPSIA 07/31/2010   CHEST DISCOMFORT 07/31/2010   Hyperlipidemia 05/16/2010   HYPERTENSION, BENIGN 05/16/2010   CAD, NATIVE VESSEL 05/16/2010      Onset date: 08/02/2022   REFERRING DIAG:  Hoarseness   THERAPY DIAG:  Other voice and resonance disorders   Dysarthria and anarthria   Rationale for Evaluation and Treatment Rehabilitation   SUBJECTIVE:    SUBJECTIVE STATEMENT: "I think it's coming along."   PERTINENT HISTORY: Patient is an 88 y.o. male with past medical history that includes HTN, HLD, CVA, parkinsonism who completed prior course of ST for aphasia in 2018. Referred to ENT by [name], to whom he reported intermittent voice change. Pt was evaluated by Dr. Tami Ribas who noted vocal fold  bowing. Pt presents for evaluation for hoarseness.      DIAGNOSTIC FINDINGS: Laryngoscopy on 08/02/22: "mild vocal cord bowing, no paralysis or mass noted." Moderate generalized cerebral atrophy, comparatively mild cerebellar atrophy, and prior left temporoparietal infarct noted per MRI brain 12/13/2021.   PAIN:  Are you having pain? No     PATIENT GOALS: Improve voice   OBJECTIVE:    TODAY'S TREATMENT:  Session focused on carryover of loudness to environment outside ST room. Pt maintained appropriate loudness during brief walk to cafeteria. Facilitated mod complex conversation outdoors with pt requiring rare min cues x3 to increase loudness, particularly when encountering wordfinding difficulty or cognitive load.   PATIENT EDUCATION: Education details: HEP, appropriate loudness Person educated: Patient Education method: Explanation, Demonstration, and auditory feedback Education comprehension: verbalized understanding and returned demonstration     GOALS: Goals reviewed with patient? Yes   SHORT TERM GOALS: Target date: 10 sessions   Patient will complete loud /a/ or alternative ("Hey!") average >80 dB with rare min cues. Baseline: 65 dB; 09/19/22: discontinued due to reduced carryover with technique (strain) Goal status: DISCONTINUED   2.  Patient will read phrases average >72 dB 90% acc with modified independence. Baseline:  09/19/22: 75dB avg 100% acc Goal status: MET   3.  Patient will generate spontaneous responses (sentence level) >72 dB 80% acc with rare min cues. Baseline:  63 dB; 09/19/22: 73 dB with min cues Goal status: MET   09/19/22: STG =LTG     LONG TERM GOALS: Target date: 11/07/2022   Patient will complete basic voice home routine >80% acc with modified independence. Baseline:  Goal status: ON-GOING   2.  Patient will maintain intelligibility >85%, intensity >72 dB in 8-10 minutes simple conversation x 3 visits. Baseline:  Goal status: ON-GOING    3.  Patient will report improved voice-related quality of life as measured by V-RQOL. Baseline: 27/50 (fair) Goal status: ONGOING       ASSESSMENT:   CLINICAL IMPRESSION: Patient presents with reduced vocal intensity, hoarse, breathy vocal quality, and mild vocal tremor. Patient able to improve vocal quality and intelligibility using intensity-based cues and is carrying this over to structured conversation; rare min cues necessary with cognitive load. Patient demonstrated carryover of loudness for conversation outside of [place] room with rare min cues today. Continue skilled ST for 1 additional session to maximize carryover and to improve vocal quality for improved communication at home.    OBJECTIVE IMPAIRMENTS include dysarthria and voice disorder. These impairments are limiting patient from effectively communicating at home and in community. Factors affecting potential to achieve goals and functional outcome are ability to learn/carryover information.. Patient will benefit from skilled SLP services to address above impairments and improve overall function.   REHAB POTENTIAL: Fair - good  , improves with decreased cognitive demand; pt has difficulty with carryover   PLAN: SLP FREQUENCY: 2x/week   SLP DURATION: 8 weeks PLANNED INTERVENTIONS: Cueing hierachy, Functional tasks, SLP instruction and  feedback, Compensatory strategies, and Patient/family education  Deneise Lever, [place], CCC-SLP Speech-Language Pathologist [phone]   Aliene Altes, [place] 10/16/2022, 2:04 PM  [place] [facility] [address] [place], [place], [address] Phone: [phone]   Fax:  [phone]

## 2022-10-18 ENCOUNTER — Ambulatory Visit: Payer: Medicare Other | Admitting: Speech Pathology

## 2022-10-23 ENCOUNTER — Encounter: Payer: Medicare Other | Admitting: Speech Pathology

## 2022-10-23 NOTE — Progress Notes (Signed)
Subjective:  Patient ID: Calvin Amber., male    DOB: 04-Jan-1933,  MRN: [id number]  Chief Complaint  Patient presents with   Foot Ulcer    88 y.o. male presents with the above complaint.  Patient presents with follow-up of left third digit ulceration limited to the breakdown of the skin.  Patient states that it is still about the same what little bit better.  He did a lot of walking when he was on the trip.  He has been taking his antibiotics   Review of Systems: Negative except as noted in the HPI. Denies N/V/F/Ch.  Past Medical History:  Diagnosis Date   Acute embolic stroke (HCC) 02/01/2017   BPH (benign prostatic hyperplasia)    Bradycardia 11/10/2015   Coronary artery disease    Dizziness 08/21/2012   DYSPEPSIA 07/31/2010   Qualifier: Diagnosis of  By: Janee Morn RN, Morrie Sheldon     Hyperlipidemia 05/16/2010   Qualifier: Diagnosis of  By: Mariah Milling MD, [name]      Hypertension    HYPERTENSION, BENIGN 05/16/2010   Qualifier: Diagnosis of  By: Mariah Milling MD, [name]     Stenosis of right carotid artery 02/01/2017    Current Outpatient Medications:    doxycycline (VIBRA-TABS) 100 MG tablet, Take 1 tablet (100 mg total) by mouth 2 (two) times daily., Disp: 60 tablet, Rfl: 0   acetaminophen (TYLENOL) 650 MG CR tablet, Take 650 mg by mouth every 8 (eight) hours as needed. , Disp: , Rfl:    Ascorbic Acid (VITAMIN C) 1000 MG tablet, Take 1,000 mg by mouth daily., Disp: , Rfl:    aspirin 81 MG tablet, Take 1 tablet (81 mg total) by mouth daily., Disp: 30 tablet, Rfl:    b complex vitamins tablet, Take 1 tablet by mouth daily., Disp: , Rfl:    baclofen (LIORESAL) 20 MG tablet, Take 20 mg by mouth 2 (two) times daily., Disp: , Rfl:    cholecalciferol (VITAMIN D) 1000 units tablet, Take 2,000 Units by mouth daily., Disp: , Rfl:    clindamycin (CLEOCIN) 150 MG capsule, Take 1 capsule (150 mg total) by mouth 3 (three) times daily., Disp: 30 capsule, Rfl: 1   cyanocobalamin (VITAMIN B12) 1000 MCG  tablet, Take 1,000 mcg by mouth daily., Disp: , Rfl:    doxycycline (VIBRA-TABS) 100 MG tablet, Take 1 tablet (100 mg total) by mouth 2 (two) times daily., Disp: 60 tablet, Rfl: 0   ezetimibe (ZETIA) 10 MG tablet, Take 1 tablet (10 mg total) by mouth daily., Disp: 90 tablet, Rfl: 3   FLAXSEED, LINSEED, PO, Take 2 g by mouth daily., Disp: , Rfl:    lisinopril (ZESTRIL) 20 MG tablet, Take by mouth., Disp: , Rfl:    mupirocin ointment (BACTROBAN) 2 %, Apply 1 Application topically 2 (two) times daily., Disp: 22 g, Rfl: 0   nitroGLYCERIN (NITROSTAT) 0.4 MG SL tablet, Place 1 tablet (0.4 mg total) under the tongue every 5 (five) minutes as needed for chest pain., Disp: 25 tablet, Rfl: 3   pregabalin (LYRICA) 100 MG capsule, TAKE ONE CAPSULE BY MOUTH EVERY MORNING FOR DIABETIC COMPLICATION PERIPHERAL NEUROPATHY, Disp: , Rfl:    pregabalin (LYRICA) 150 MG capsule, Take 150 mg in the am & 100 mg in the pm (Patient not taking: Reported on 08/03/2022), Disp: , Rfl:    pregabalin (LYRICA) 150 MG capsule, TAKE ONE CAPSULE BY MOUTH AT BEDTIME FOR DIABETIC COMPLICATION NOTE: TAKING 100MG  DAILY AND 150MG  AT NIGHT., Disp: , Rfl:  simvastatin (ZOCOR) 40 MG tablet, Take 1 tablet (40 mg total) by mouth every evening., Disp: 90 tablet, Rfl: 3   tamsulosin (FLOMAX) 0.4 MG CAPS capsule, Take 2 capsules (0.8 mg total) by mouth daily., Disp: 30 capsule, Rfl: 0   zinc sulfate 220 (50 Zn) MG capsule, Take 1 tablet by mouth daily., Disp: , Rfl:   Social History   Tobacco Use  Smoking Status Never  Smokeless Tobacco Never    Allergies  Allergen Reactions   Penicillins Rash    Rash    Rash    Gabapentin    Penicillin G    Codeine Nausea Only and Nausea And Vomiting    Other reaction(s): Vomiting Other reaction(s): Vomiting   Objective:  There were no vitals filed for this visit. There is no height or weight on file to calculate BMI. Constitutional Well developed. Well nourished.  Vascular Dorsalis pedis  pulses faint the palpable bilaterally. Posterior tibial pulses faint the palpable bilaterally. Capillary refill limb-to all digits.  No cyanosis or clubbing noted. Pedal hair growth normal.  Neurologic Normal speech. Oriented to person, place, and time. Epicritic sensation to light touch grossly present bilaterally.  Dermatologic Left third digit ulceration limited to the breakdown of the skin.  It is superficial.  There is no redness present.  No clinical signs of infection no purulent drainage does not probe down to bone.  Orthopedic: Normal joint ROM without pain or crepitus bilaterally. No visible deformities. No bony tenderness.   Radiographs: 3 views of skeletally mature adult left foot:No radiographic notes of cortical structure noted to the third digit.  No soft tissue emphysema noted no bony abnormalities identified Assessment:   1. Skin ulcer of toe of left foot, limited to breakdown of skin (HCC)    Plan:  Patient was evaluated and treated and all questions answered.  Left third digit ulceration limited to the breakdown of the skin -No debridement was necessary as this mostly granular in nature -Encouraged him to continue wearing surgical shoe to help offload the toe -Patient had ABIs done at [facility] which was normal -Continue doing Betadine wet-to-dry -Continue doxycycline -X-rays were obtained which shows no radiographic evidence of osteomyelitis. -If there is no improvement may discuss flexor tenotomy during next clinical visit  No follow-ups on file.

## 2022-10-29 ENCOUNTER — Encounter: Payer: Medicare Other | Admitting: Speech Pathology

## 2022-10-30 ENCOUNTER — Encounter: Payer: Medicare Other | Admitting: Speech Pathology

## 2022-11-01 ENCOUNTER — Encounter: Payer: Medicare Other | Admitting: Speech Pathology

## 2022-11-06 ENCOUNTER — Encounter: Payer: Medicare Other | Admitting: Speech Pathology

## 2022-11-08 ENCOUNTER — Encounter: Payer: Medicare Other | Admitting: Speech Pathology

## 2022-11-12 ENCOUNTER — Encounter: Payer: Medicare Other | Attending: Physician Assistant | Admitting: Physician Assistant

## 2022-11-12 DIAGNOSIS — L97522 Non-pressure chronic ulcer of other part of left foot with fat layer exposed: Secondary | ICD-10-CM | POA: Diagnosis present

## 2022-11-12 DIAGNOSIS — I2510 Atherosclerotic heart disease of native coronary artery without angina pectoris: Secondary | ICD-10-CM | POA: Insufficient documentation

## 2022-11-12 DIAGNOSIS — L84 Corns and callosities: Secondary | ICD-10-CM | POA: Insufficient documentation

## 2022-11-12 DIAGNOSIS — G20C Parkinsonism, unspecified: Secondary | ICD-10-CM | POA: Diagnosis not present

## 2022-11-12 DIAGNOSIS — G603 Idiopathic progressive neuropathy: Secondary | ICD-10-CM | POA: Diagnosis not present

## 2022-11-12 DIAGNOSIS — I10 Essential (primary) hypertension: Secondary | ICD-10-CM | POA: Insufficient documentation

## 2022-11-12 NOTE — Progress Notes (Signed)
AZAR, SOUTH ([id number]) [phone] [id number] Page 1 of 5 Visit Report for 11/12/2022 Abuse Risk Screen Details Patient Name: Date of Service: [name], [name] 11/12/2022 1:00 PM Medical Record Number: [id number] Patient Account Number: [id number] Date of Birth/Sex: Treating RN: [birth year] (88 y.o. [name] Primary Care Emrys Mckamie: Aram Beecham Other Clinician: Referring Adalbert Alberto: Treating Ranata Laughery/Extender: Hermine Messick Weeks in Treatment: 0 Abuse Risk Screen Items Answer Electronic Signature(s) Signed: 11/12/2022 3:51:57 PM By: Midge Aver MSN RN CNS WTA Entered By: Midge Aver on 11/12/2022 13:30:13 -------------------------------------------------------------------------------- Activities of Daily Living Details Patient Name: Date of Service: [name], [name] 11/12/2022 1:00 PM Medical Record Number: [id number] Patient Account Number: [id number] Date of Birth/Sex: Treating RN: 1932-12-18 (88 y.o. [name] Primary Care Saamir Armstrong: Aram Beecham Other Clinician: Referring Shuaib Corsino: Treating Randall Rampersad/Extender: Hermine Messick Weeks in Treatment: 0 Activities of Daily Living Items Answer Activities of Daily Living (Please select one for each item) Drive Automobile Completely Able T Medications ake Need Assistance Use T elephone Completely Able Care for Appearance Completely Able Use T oilet Completely Able Bath / Shower Completely Able Dress Self Completely Able Feed Self Completely Able Walk Completely Able Get In / Out Bed Completely Able Housework Not Able Prepare Meals Not Able Handle Money Not Able Shop for Self Need Assistance EDDIE, PAYETTE ([id number]) [phone] [email or web address] Page 2 of 5 Electronic Signature(s) Signed: 11/12/2022 3:51:57 PM By: Midge Aver MSN RN CNS WTA Entered By: Midge Aver on 11/12/2022  13:31:19 -------------------------------------------------------------------------------- Education Screening Details Patient Name: Date of Service: [name], [name] 11/12/2022 1:00 PM Medical Record Number: [id number] Patient Account Number: [id number] Date of Birth/Sex: Treating RN: 1932-12-08 (88 y.o. [name] Primary Care Aaren Krog: Aram Beecham Other Clinician: Referring Sumner Kirchman: Treating Camdin Hegner/Extender: Gabriel Earing in Treatment: 0 Learning Preferences/Education Level/Primary Language Learning Preference: Explanation, Demonstration Highest Education Level: College or Above Preferred Language: English Cognitive Barrier Language Barrier: No Translator Needed: No Memory Deficit: No Emotional Barrier: No Cultural/Religious Beliefs Affecting Medical Care: No Physical Barrier Impaired Vision: Yes Glasses Impaired Hearing: Yes Hearing Aid Decreased Hand dexterity: No Knowledge/Comprehension Knowledge Level: High Comprehension Level: High Ability to understand written instructions: High Ability to understand verbal instructions: High Motivation Anxiety Level: Calm Cooperation: Cooperative Education Importance: Acknowledges Need Interest in Health Problems: Asks Questions Perception: Coherent Willingness to Engage in Self-Management High Activities: Readiness to Engage in Self-Management High Activities: Electronic Signature(s) Signed: 11/12/2022 3:51:57 PM By: Midge Aver MSN RN CNS WTA Entered By: Midge Aver on 11/12/2022 13:32:03 Dulcy Fanny ([id number]) [id number] Page 3 of 5 -------------------------------------------------------------------------------- Fall Risk Assessment Details Patient Name: Date of Service: [name], [name] 11/12/2022 1:00 PM Medical Record Number: [id number] Patient Account Number: [id number] Date of Birth/Sex: Treating RN: 05/29/1933 (88 y.o. [name] Primary Care Hatsue Sime: Aram Beecham Other Clinician: Referring Ashaunte Standley: Treating Jamarious Febo/Extender: Hermine Messick Weeks in Treatment: 0 Fall Risk Assessment Items Have you had 2 or more falls in the last 12 monthso 0 No Have you had any fall that resulted in injury in the last 12 monthso 0 No FALLS RISK SCREEN History of falling - immediate or within 3 months 0 No Secondary diagnosis (Do you have 2 or more medical diagnoseso) 0 No Ambulatory aid None/bed rest/wheelchair/nurse 0 No Crutches/cane/walker 15 Yes Furniture 0 No Intravenous therapy Access/Saline/Heparin Lock 0 No Gait/Transferring Normal/ bed rest/ wheelchair 0 No Weak (short steps with or  without shuffle, stooped but able to lift head while walking, may seek 0 No support from furniture) Impaired (short steps with shuffle, may have difficulty arising from chair, head down, impaired 0 No balance) Mental Status Oriented to own ability 0 Yes Electronic Signature(s) Signed: 11/12/2022 3:51:57 PM By: Midge Aver MSN RN CNS WTA Entered By: Midge Aver on 11/12/2022 13:32:34 -------------------------------------------------------------------------------- Foot Assessment Details Patient Name: Date of Service: [name], [name] 11/12/2022 1:00 PM Medical Record Number: [id number] Patient Account Number: [id number] Date of Birth/Sex: Treating RN: 05/18/1933 (88 y.o. [name] Primary Care Dalessandro Baldyga: Aram Beecham Other Clinician: Referring Oluwadamilare Tobler: Treating Zinedine Ellner/Extender: Hermine Messick Weeks in Treatment: 0 Foot Assessment Items Site Locations DOLORES, MCGOVERN ([id number]) [phone] [id number] Page 4 of 5 + = Sensation present, - = Sensation absent, C = Callus, U = Ulcer R = Redness, W = Warmth, M = Maceration, PU = Pre-ulcerative lesion F = Fissure, S = Swelling, D = Dryness Assessment Right: Left: Other Deformity: No No Prior Foot Ulcer: No  No Prior Amputation: No No Charcot Joint: No No Ambulatory Status: Ambulatory Without Help Gait: Steady Electronic Signature(s) Signed: 11/12/2022 3:51:57 PM By: Midge Aver MSN RN CNS WTA Entered By: Midge Aver on 11/12/2022 13:33:44 -------------------------------------------------------------------------------- Nutrition Risk Screening Details Patient Name: Date of Service: [name], [name] 11/12/2022 1:00 PM Medical Record Number: [id number] Patient Account Number: [id number] Date of Birth/Sex: Treating RN: [birth year] (88 y.o. [name] Primary Care Nahiara Kretzschmar: Aram Beecham Other Clinician: Referring Yajaira Doffing: Treating Deland Slocumb/Extender: Hermine Messick Weeks in Treatment: 0 Height (in): 68 Weight (lbs): 185 Body Mass Index (BMI): 28.1 Nutrition Risk Screening Items Score Screening NUTRITION RISK SCREEN: I have an illness or condition that made me change the kind and/or amount of food I eat 0 No I eat fewer than two meals per day 0 No I eat few fruits and vegetables, or milk products 0 No I have three or more drinks of beer, liquor or wine almost every day 0 No I have tooth or mouth problems that make it hard for me to eat 0 No I don't always have enough money to buy the food I need 0 No ZYMIR, NAPOLI ([id number]) [id number] Page 5 of 5 I eat alone most of the time 0 No I take three or more different prescribed or over-the-counter drugs a day 1 Yes Without wanting to, I have lost or gained 10 pounds in the last six months 0 No I am not always physically able to shop, cook and/or feed myself 0 No Nutrition Protocols Good Risk Protocol 0 No interventions needed Moderate Risk Protocol High Risk Proctocol Risk Level: Good Risk Score: 1 Electronic Signature(s) Signed: 11/12/2022 3:51:57 PM By: Midge Aver MSN RN CNS WTA Entered By: Midge Aver on 11/12/2022 13:32:50

## 2022-11-12 NOTE — Progress Notes (Signed)
ELMIN, WIEDERHOLT ([id number]) [id number] Page 1 of 9 Visit Report for 11/12/2022 Allergy List Details Patient Name: Date of Service: [name], Calvin RGE S. 11/12/2022 1:00 PM Medical Record Number: [id number] Patient Account Number: [id number] Date of Birth/Sex: Treating RN: 03-05-1933 (88 y.o. Seward Meth Primary Care Jourdin Connors: Fulton Reek Other Clinician: Referring Manvir Thorson: Treating Jazz Biddy/Extender: Maurilio Lovely Weeks in Treatment: 0 Allergies Active Allergies penicillin Reaction: rash Severity: Moderate gabapentin Reaction: bad side effects Severity: Moderate codeine Reaction: itching, nausea Allergy Notes Electronic Signature(s) Signed: 11/12/2022 2:21:08 PM By: Rosalio Loud MSN RN CNS WTA Entered By: Rosalio Loud on 11/12/2022 14:21:08 -------------------------------------------------------------------------------- Arrival Information Details Patient Name: Date of Service: [name], Calvin RGE S. 11/12/2022 1:00 PM Medical Record Number: [id number] Patient Account Number: [id number] Date of Birth/Sex: Treating RN: [birth year] (88 y.o. Seward Meth Primary Care Roniel Halloran: Fulton Reek Other Clinician: Referring Keiosha Cancro: Treating Nomar Broad/Extender: Rupert Stacks in Treatment: 0 Visit Information Patient Arrived: Lyndel Pleasure Time: 13:13 Accompanied By: wife Transfer Assistance: None Patient Identification VerifiedALIOUNE, [name] ([id number]) [id number] Page 2 of 9 Secondary Verification Process Completed: Yes Patient Requires Transmission-Based Precautions: No Patient Has Alerts: Yes Patient Alerts: NOT Diabetic Electronic Signature(s) Signed: 11/12/2022 3:51:57 PM By: Rosalio Loud MSN RN CNS WTA Entered By: Rosalio Loud on 11/12/2022 13:14:09 -------------------------------------------------------------------------------- Clinic Level of Care Assessment  Details Patient Name: Date of Service: [name], Calvin RGE S. 11/12/2022 1:00 PM Medical Record Number: [id number] Patient Account Number: [id number] Date of Birth/Sex: Treating RN: [birth year] (88 y.o. Seward Meth Primary Care Phyllistine Domingos: Fulton Reek Other Clinician: Referring Quinn Quam: Treating Finis Hendricksen/Extender: Maurilio Lovely Weeks in Treatment: 0 Clinic Level of Care Assessment Items TOOL 1 Quantity Score _0  - 0 Use when EandM and Procedure is performed on INITIAL visit ASSESSMENTS - Nursing Assessment / Reassessment _1  - 0 General Physical Exam (combine w/ comprehensive assessment (listed just below) when performed on new pt. evals) _2  - 0 Comprehensive Assessment (HX, ROS, Risk Assessments, Wounds Hx, etc.) ASSESSMENTS - Wound and Skin Assessment / Reassessment _3  - 0 Dermatologic / Skin Assessment (not related to wound area) ASSESSMENTS - Ostomy and/or Continence Assessment and Care _4  - 0 Incontinence Assessment and Management _5  - 0 Ostomy Care Assessment and Management (repouching, etc.) PROCESS - Coordination of Care _6  - 0 Simple Patient / Family Education for ongoing care _7  - 0 Complex (extensive) Patient / Family Education for ongoing care _8  - 0 Staff obtains Programmer, systems, Records, T Results / Process Orders est _9  - 0 Staff telephones HHA, Nursing Homes / Clarify orders / etc _10  - 0 Routine Transfer to another Facility (non-emergent condition) _11  - 0 Routine Hospital Admission (non-emergent condition) _12  - 0 New Admissions / Biomedical engineer / Ordering NPWT Apligraf, etc. , _13  - 0 Emergency Hospital Admission (emergent condition) PROCESS - Special Needs _14  - 0 Pediatric / Minor Patient Management _15  - 0 Isolation Patient Management _16  - 0 Hearing / Language / Visual special needs _17  - 0 Assessment of Community assistance (transportation, D/C planning, etc.) _18  - 0 Additional assistance / Altered mentation _19  - 0 Support  Surface(s) Assessment (bed, cushion, seat, etc.) [name], [name] ([id number]) [id number] Page 3 of 9 INTERVENTIONS - Miscellaneous _20  - 0 External ear exam _21  - 0 Patient Transfer (multiple staff / Civil Service fast streamer / Similar devices) _22  - 0 Simple Staple / Suture removal (25 or less) _23  - 0 Complex Staple / Suture removal (26 or more) _24  -  0 Hypo/Hyperglycemic Management (do not check if billed separately) _0  - 0 Ankle / Brachial Index (ABI) - do not check if billed separately Has the patient been seen at the hospital within the last three years: Yes Total Score: 0 Level Of Care: ____ Electronic Signature(s) Signed: 11/12/2022 3:51:57 PM By: Rosalio Loud MSN RN CNS WTA Entered By: Rosalio Loud on 11/12/2022 14:23:15 -------------------------------------------------------------------------------- Encounter Discharge Information Details Patient Name: Date of Service: [name], Calvin RGE S. 11/12/2022 1:00 PM Medical Record Number: [id number] Patient Account Number: [id number] Date of Birth/Sex: Treating RN: 08/06/1933 (88 y.o. Seward Meth Primary Care Lastacia Solum: Fulton Reek Other Clinician: Referring Adell Panek: Treating Reichen Hutzler/Extender: Maurilio Lovely Weeks in Treatment: 0 Encounter Discharge Information Items Post Procedure Vitals Discharge Condition: Stable Temperature (F): 97.7 Ambulatory Status: Cane Pulse (bpm): 49 Discharge Destination: Home Respiratory Rate (breaths/min): 16 Transportation: Private Auto Blood Pressure (mmHg): 148/70 Accompanied By: wife Schedule Follow-up Appointment: Yes Clinical Summary of Care: Electronic Signature(s) Signed: 11/12/2022 2:23:42 PM By: Rosalio Loud MSN RN CNS WTA Previous Signature: 11/12/2022 2:18:23 PM Version By: Rosalio Loud MSN RN CNS WTA Entered By: Rosalio Loud on 11/12/2022 14:23:41 -------------------------------------------------------------------------------- Lower Extremity  Assessment Details Patient Name: Date of Service: [name], Calvin RGE S. 11/12/2022 1:00 PM Medical Record Number: [id number] Patient Account Number: [id number] Date of Birth/Sex: Treating RN: 1933-11-22 (88 y.o. [name], [name], [name] ([id number]) [id number] Page 4 of 9 Primary Care Ryder Man: Fulton Reek Other Clinician: Referring Sewell Pitner: Treating Trenisha Lafavor/Extender: Rupert Stacks in Treatment: 0 Electronic Signature(s) Signed: 11/12/2022 2:21:04 PM By: Rosalio Loud MSN RN CNS WTA Entered By: Rosalio Loud on 11/12/2022 14:21:04 -------------------------------------------------------------------------------- Multi Wound Chart Details Patient Name: Date of Service: [name], Calvin RGE S. 11/12/2022 1:00 PM Medical Record Number: [id number] Patient Account Number: [id number] Date of Birth/Sex: Treating RN: 1933-08-14 (88 y.o. Seward Meth Primary Care Veverly Larimer: Fulton Reek Other Clinician: Referring Taher Vannote: Treating Malak Duchesneau/Extender: Maurilio Lovely Weeks in Treatment: 0 Vital Signs Height(in): 68 Pulse(bpm): 49 Weight(lbs): 185 Blood Pressure(mmHg): 148/70 Body Mass Index(BMI): 28.1 Temperature(F): 97.7 Respiratory Rate(breaths/min): 16 [1:Photos:] [N/A:N/A] Left, Posterior T Third oe N/A N/A Wound Location: Gradually Appeared N/A N/A Wounding Event: Neuropathic Ulcer-Non Diabetic N/A N/A Primary Etiology: 10/01/2022 N/A N/A Date Acquired: 0 N/A N/A Weeks of Treatment: Open N/A N/A Wound Status: No N/A N/A Wound Recurrence: 0.1x0.2x0.1 N/A N/A Measurements L x W x D (cm) 0.016 N/A N/A A (cm) : rea 0.002 N/A N/A Volume (cm) : Unclassifiable N/A N/A Classification: None Present N/A N/A Exudate A mount: None Present (0%) N/A N/A Granulation A mount: Fascia: No N/A N/A Exposed Structures: Fat Layer (Subcutaneous Tissue): No Tendon: No Muscle: No Joint: No Bone: No None N/A  N/A Epithelialization: Treatment Notes Electronic Signature(s) Signed: 11/12/2022 3:51:57 PM By: Rosalio Loud MSN RN CNS Leona Singleton ([id number]) [id number] Page 5 of 9 Entered By: Rosalio Loud on 11/12/2022 13:39:03 -------------------------------------------------------------------------------- Multi-Disciplinary Care Plan Details Patient Name: Date of Service: [name], Calvin RGE S. 11/12/2022 1:00 PM Medical Record Number: [id number] Patient Account Number: [id number] Date of Birth/Sex: Treating RN: [birth year] (88 y.o. Seward Meth Primary Care Curstin Schmale: Fulton Reek Other Clinician: Referring Jsaon Yoo: Treating Takiyah Bohnsack/Extender: Maurilio Lovely Weeks in Treatment: 0 Active Inactive Necrotic Tissue Nursing Diagnoses: Impaired tissue integrity related to necrotic/devitalized tissue Knowledge deficit related to management of necrotic/devitalized tissue Goals: Necrotic/devitalized tissue will be minimized in the wound bed Date Initiated: 11/12/2022 Target Resolution Date: 12/08/2022  Goal Status: Active Patient/caregiver will verbalize understanding of reason and process for debridement of necrotic tissue Date Initiated: 11/12/2022 Target Resolution Date: 12/08/2022 Goal Status: Active Interventions: Assess patient pain level pre-, during and post procedure and prior to discharge Provide education on necrotic tissue and debridement process Treatment Activities: Excisional debridement : 11/12/2022 Notes: Wound/Skin Impairment Nursing Diagnoses: Impaired tissue integrity Knowledge deficit related to ulceration/compromised skin integrity Goals: Patient will demonstrate a reduced rate of smoking or cessation of smoking Date Initiated: 11/12/2022 Target Resolution Date: 12/08/2022 Goal Status: Active Patient will have a decrease in wound volume by X% from date: (specify in notes) Date Initiated: 11/12/2022 Target Resolution Date:  12/08/2022 Goal Status: Active Patient/caregiver will verbalize understanding of skin care regimen Date Initiated: 11/12/2022 Target Resolution Date: 12/08/2022 Goal Status: Active Ulcer/skin breakdown will have a volume reduction of 30% by week 4 Date Initiated: 11/12/2022 Target Resolution Date: 12/08/2022 Goal Status: Active Ulcer/skin breakdown will have a volume reduction of 50% by week 8 Date Initiated: 11/12/2022 Target Resolution Date: 01/08/2023 Goal Status: Active Interventions: Assess patient/caregiver ability to obtain necessary supplies [name], [name] ([id number]) [id number] Page 6 of 9 Assess patient/caregiver ability to perform ulcer/skin care regimen upon admission and as needed Assess ulceration(s) every visit Provide education on ulcer and skin care Treatment Activities: Referred to DME Moya Duan for dressing supplies : 11/12/2022 Skin care regimen initiated : 11/12/2022 Notes: Electronic Signature(s) Signed: 11/12/2022 3:51:57 PM By: Rosalio Loud MSN RN CNS WTA Entered By: Rosalio Loud on 11/12/2022 13:50:17 -------------------------------------------------------------------------------- Pain Assessment Details Patient Name: Date of Service: [name], Calvin RGE S. 11/12/2022 1:00 PM Medical Record Number: [id number] Patient Account Number: [id number] Date of Birth/Sex: Treating RN: [birth year] (88 y.o. Seward Meth Primary Care Illias Pantano: Fulton Reek Other Clinician: Referring Viraat Vanpatten: Treating Glendell Schlottman/Extender: Maurilio Lovely Weeks in Treatment: 0 Active Problems Location of Pain Severity and Description of Pain Patient Has Paino No Site Locations Pain Management and Medication Current Pain Management: Electronic Signature(s) Signed: 11/12/2022 3:51:57 PM By: Rosalio Loud MSN RN CNS WTA Entered By: Rosalio Loud on 11/12/2022 13:14:31 [name] ([id number]) [id number] Page 7 of  9 -------------------------------------------------------------------------------- Patient/Caregiver Education Details Patient Name: Date of Service: [name] 12/11/2023andnbsp1:00 PM Medical Record Number: [id number] Patient Account Number: [id number] Date of Birth/Gender: Treating RN: 1933/10/06 (88 y.o. Seward Meth Primary Care Physician: Fulton Reek Other Clinician: Referring Physician: Treating Physician/Extender: Rupert Stacks in Treatment: 0 Education Assessment Education Provided To: Patient and Caregiver Education Topics Provided [place]: o Handouts: Welcome T [facility] [place] o Methods: Explain/Verbal Responses: State content correctly Wound Debridement: Handouts: Wound Debridement Methods: Explain/Verbal Responses: State content correctly Electronic Signature(s) Signed: 11/12/2022 3:51:57 PM By: Rosalio Loud MSN RN CNS WTA Entered By: Rosalio Loud on 11/12/2022 13:48:28 -------------------------------------------------------------------------------- Wound Assessment Details Patient Name: Date of Service: [name], Calvin RGE S. 11/12/2022 1:00 PM Medical Record Number: [id number] Patient Account Number: [id number] Date of Birth/Sex: Treating RN: Oct 05, 1933 (88 y.o. Seward Meth Primary Care Lewie Deman: Fulton Reek Other Clinician: Referring Yehudis Monceaux: Treating Wasil Wolke/Extender: Maurilio Lovely Weeks in Treatment: 0 Wound Status Wound Number: 1 Primary Etiology: Neuropathic Ulcer-Non Diabetic Wound Location: Left, Plantar T Third oe Wound Status: Open Wounding Event: Gradually Appeared Comorbid History: Coronary Artery Disease, Hypertension, Neuropathy Date Acquired: 10/01/2022 Weeks Of Treatment: 0 Clustered Wound: No [name], [name] ([id number]) [id number] Page 8 of 9 Photos Wound Measurements Length: (cm) 0.1 Width: (cm)  0.2 Depth: (cm)  0.1 Area: (cm) 0.016 Volume: (cm) 0.002 % Reduction in Area: % Reduction in Volume: Epithelialization: Small (1-33%) Tunneling: No Undermining: No Wound Description Classification: Full Thickness Without Exposed Support Structures Wound Margin: Thickened Exudate Amount: Medium Exudate Type: Serosanguineous Exudate Color: red, brown Foul Odor After Cleansing: No Slough/Fibrino No Wound Bed Granulation Amount: Small (1-33%) Exposed Structure Granulation Quality: Red Fascia Exposed: No Necrotic Amount: Small (1-33%) Fat Layer (Subcutaneous Tissue) Exposed: Yes Necrotic Quality: Adherent Slough Tendon Exposed: No Muscle Exposed: No Joint Exposed: No Bone Exposed: No Treatment Notes Wound #1 (Toe Third) Wound Laterality: Left, Posterior Cleanser [place]ram Ancillary Kit - 15 Day Supply Discharge Instruction: Use supplies as instructed; Kit contains: (15) Saline Bullets; (15) 3x3 Gauze; 15 pr Gloves Soap and Water Discharge Instruction: Gently cleanse wound with antibacterial soap, rinse and pat dry prior to dressing wounds Peri-Wound Care Topical Primary Dressing Silvercel Small 2x2 (in/in) Discharge Instruction: Apply Silvercel Small 2x2 (in/in) as instructed Secondary Dressing Gauze Discharge Instruction: As directed: dry, moistened with saline or moistened with Dakins Solution Secured With [employer] Dressing, Latex-free, Size 5, Small-Head / Shoulder / Thigh Compression Wrap Compression Stockings Add-Ons Electronic Signature(s) Signed: 11/12/2022 2:33:58 PM By: Worthy Keeler PA-C Signed: 11/12/2022 3:51:57 PM By: Rosalio Loud MSN RN CNS WTA Previous Signature: 11/12/2022 2:22:15 PM Version By: Rosalio Loud MSN RN CNS WTA Previous Signature: 11/12/2022 2:20:51 PM Version By: Rosalio Loud MSN RN CNS WTA Previous Signature: 11/12/2022 2:15:20 PM Version By: Rosalio Loud MSN RN CNS WTA Previous Signature: 11/12/2022 2:14:01 PM Version By: Rosalio Loud MSN RN CNS  WTA Entered By: Worthy Keeler on 11/12/2022 14:33:57 [name] ([id number]) [id number] Page 9 of 9 -------------------------------------------------------------------------------- Vitals Details Patient Name: Date of Service: [name], Calvin RGE S. 11/12/2022 1:00 PM Medical Record Number: [id number] Patient Account Number: [id number] Date of Birth/Sex: Treating RN: 1933-07-21 (88 y.o. Seward Meth Primary Care Marlie Kuennen: Fulton Reek Other Clinician: Referring Lila Lufkin: Treating Keniah Klemmer/Extender: Maurilio Lovely Weeks in Treatment: 0 Vital Signs Time Taken: 13:14 Temperature (F): 97.7 Height (in): 68 Pulse (bpm): 49 Source: Stated Respiratory Rate (breaths/min): 16 Weight (lbs): 185 Blood Pressure (mmHg): 148/70 Source: Stated Reference Range: 80 - 120 mg / dl Body Mass Index (BMI): 28.1 Electronic Signature(s) Signed: 11/12/2022 3:51:57 PM By: Rosalio Loud MSN RN CNS WTA Entered By: Rosalio Loud on 11/12/2022 13:17:30

## 2022-11-13 ENCOUNTER — Encounter: Payer: Medicare Other | Admitting: Speech Pathology

## 2022-11-13 NOTE — Progress Notes (Signed)
DOIS, JUARBE ([id number]) [id number] Page 1 of 9 Visit Report for 11/12/2022 Chief Complaint Document Details Patient Name: Date of Service: [name], Calvin RGE S. 11/12/2022 1:00 PM Medical Record Number: [id number] Patient Account Number: [id number] Date of Birth/Sex: Treating RN: 10-05-1933 (88 y.o. Seward Meth Primary Care Provider: Fulton Reek Other Clinician: Referring Provider: Treating Provider/Extender: Rupert Stacks in Treatment: 0 Information Obtained from: Patient Chief Complaint Left 3rd T Ulcer and Callus Buildup oe Electronic Signature(s) Signed: 11/12/2022 1:37:27 PM By: Worthy Keeler PA-C Entered By: Worthy Keeler on 11/12/2022 13:37:27 -------------------------------------------------------------------------------- Debridement Details Patient Name: Date of Service: [name], Calvin RGE S. 11/12/2022 1:00 PM Medical Record Number: [id number] Patient Account Number: [id number] Date of Birth/Sex: Treating RN: [birth year] (88 y.o. Seward Meth Primary Care Provider: Fulton Reek Other Clinician: Referring Provider: Treating Provider/Extender: Maurilio Lovely Weeks in Treatment: 0 Debridement Performed for Assessment: Wound #1 Left,Posterior T Pollard oe Performed By: Physician Tommie Sams., PA-C Debridement Type: Debridement Level of Consciousness (Pre-procedure): Awake and Alert Pre-procedure Verification/Time Out Yes - 13:41 Taken: Start Time: 13:41 T Area Debrided (L x W): otal 0.1 (cm) x 0.2 (cm) = 0.02 (cm) Tissue and other material debrided: Non-Viable, Callus, Slough, Subcutaneous, Slough Level: Skin/Subcutaneous Tissue Debridement Description: Excisional Instrument: Curette Bleeding: None Hemostasis Achieved: Pressure Response to Treatment: Procedure was tolerated well Level of Consciousness (Post- Awake and Alert procedure): Post Debridement Measurements of Total  Wound [name], [name] [id number] Page 2 of 9 Length: (cm) 0.1 Width: (cm) 0.2 Depth: (cm) 0.2 Volume: (cm) 0.003 Character of Wound/Ulcer Post Debridement: Stable Post Procedure Diagnosis Same as Pre-procedure Electronic Signature(s) Signed: 11/12/2022 3:51:57 PM By: Rosalio Loud MSN RN CNS WTA Signed: 11/12/2022 5:07:14 PM By: Worthy Keeler PA-C Entered By: Rosalio Loud on 11/12/2022 13:44:58 -------------------------------------------------------------------------------- HPI Details Patient Name: Date of Service: [name], Calvin RGE S. 11/12/2022 1:00 PM Medical Record Number: [id number] Patient Account Number: [id number] Date of Birth/Sex: Treating RN: 16-Feb-1933 (88 y.o. Seward Meth Primary Care Provider: Fulton Reek Other Clinician: Referring Provider: Treating Provider/Extender: Maurilio Lovely Weeks in Treatment: 0 History of Present Illness HPI Description: 11-12-2022 upon evaluation today patient presents for initial inspection here in our clinic concerning a wound on his left [name] on the plantar aspect. With that being said based on what I am seeing I do believe that the patient is doing quite well as far as the overall appearance is concerned. There is a question as to whether or not this is actually open or not and will get have to remove some of the callus in order to determine this to be perfectly honest. This wound has been present it appears since somewhere around 30 October Dr. Posey Pronto has been taking care of this. When the patient's primary care provider, Dr. Fulton Reek, saw this he wanted the patient to come here for second opinion just to see if there is anything we can do to help speed up the healing process. 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 Electronic Signature(s) Signed: 11/12/2022 2:34:44 PM By: Worthy Keeler PA-C Entered By: Worthy Keeler on 11/12/2022 14:34:44 -------------------------------------------------------------------------------- Physical Exam Details Patient Name: Date of Service: [name], Calvin RGE S. 11/12/2022 1:00 PM Medical Record Number: [id number] Patient Account Number: [id number] Date of Birth/Sex: Treating RN: 1932/12/21 (88 y.o. Seward Meth Primary Care Provider: Doy Hutching,  Dellis Filbert Other Clinician: Referring Provider: Treating Provider/Extender: Rupert Stacks in Treatment: [address] [name], [name] ([id number]) [id number] Page 3 of 9 Constitutional patient is hypertensive.. pulse regular and within target range for patient.Marland Kitchen respirations regular, non-labored and within target range for patient.Marland Kitchen temperature within target range for patient.. Well-nourished and well-hydrated in no acute distress. Eyes conjunctiva clear no eyelid edema noted. pupils equal round and reactive to light and accommodation. Ears, Nose, Mouth, and Throat no gross abnormality of ear auricles or external auditory canals. normal hearing noted during conversation. mucus membranes moist. Respiratory normal breathing without difficulty. Cardiovascular 2+ dorsalis pedis/posterior tibialis pulses. no clubbing, cyanosis, significant edema, <3 sec cap refill. Musculoskeletal normal gait and posture. no significant deformity or arthritic changes, no loss or range of motion, no clubbing. Psychiatric this patient is able to make decisions and demonstrates good insight into disease process. Alert and Oriented x 3. pleasant and cooperative. Notes Upon inspection patient's wound did require sharp debridement to clearway some of the necrotic debris and callus over the surface of the wound. Again I was not certain there was a wound until I got this cleared away once I did it became apparent there was a very tiny opening where there was some slough and biofilm  buildup on the surface I did perform debridement to clear this away once debrided the wound actually appears to be doing significantly better which is great news. Overall I think this is excellent news. I do think that it looks as if he does not have any signs of infection and my opinion is going to be that we try to get this healed I do not think he needs any additional x-rays he did have an x-ray with Dr. Posey Pronto in office which Dr. Posey Pronto commented on did not show any signs of osteomyelitis or bone degradation. Electronic Signature(s) Signed: 11/12/2022 2:36:03 PM By: Worthy Keeler PA-C Previous Signature: 11/12/2022 2:35:36 PM Version By: Worthy Keeler PA-C Entered By: Worthy Keeler on 11/12/2022 14:36:02 -------------------------------------------------------------------------------- Physician Orders Details Patient Name: Date of Service: [name], Calvin RGE S. 11/12/2022 1:00 PM Medical Record Number: [id number] Patient Account Number: [id number] Date of Birth/Sex: Treating RN: 1933-05-13 (88 y.o. 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 [address] [name], [name] Stephens ([id number]) [id number] Page 4 of 9 May shower; gently cleanse wound with antibacterial soap, rinse and pat dry prior to dressing wounds Wound Treatment Wound #1 - T Pollard oe Wound Laterality: Left, Posterior Cleanser:  Byram Ancillary Kit - 15 Day Supply (DME) (Generic) 3 x Per Week/30 Days Discharge Instructions: Use supplies as instructed; Kit contains: (15) Saline Bullets; (15) 3x3 Gauze; 15 pr Gloves Cleanser: Soap and Water 3 x Per Week/30 Days Discharge Instructions: Gently cleanse wound with antibacterial soap, rinse and pat dry prior to dressing wounds Prim Dressing: Silvercel Small 2x2 (in/in) (DME) (Dispense As Written) 3 x Per Week/30 Days ary Discharge Instructions: Apply Silvercel Small 2x2 (in/in) as instructed Secondary Dressing: Gauze 3 x Per Week/30 Days Discharge Instructions: As directed: dry, moistened with saline or  moistened with Dakins Solution Secured With: [employer], Latex-free, Size 5, Small-Head / Shoulder / Thigh (DME) (Generic) 3 x Per Week/30 Days Electronic Signature(s) Signed: 11/12/2022 2:23:08 PM By: Rosalio Loud MSN RN CNS WTA Signed: 11/12/2022 5:07:14 PM By: Worthy Keeler PA-C Previous Signature: 11/12/2022 2:17:43 PM Version By: Rosalio Loud MSN RN CNS WTA Entered By: Rosalio Loud on 11/12/2022 14:23:08 -------------------------------------------------------------------------------- Problem List Details Patient Name: Date of Service: [name], Calvin RGE S. 11/12/2022 1:00 PM Medical Record Number: [id number] Patient Account Number: [id number] Date of Birth/Sex: Treating RN: Apr 13, 1933 (88 y.o. 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 11/12/2022 No Yes L97.522 Non-pressure chronic ulcer of other part of left foot with fat layer exposed 11/12/2022 No Yes L84 Corns and callosities 11/12/2022 No Yes G20.C Parkinsonism, unspecified 11/12/2022 No Yes I25.10 Atherosclerotic heart disease of native coronary artery  without angina pectoris 11/12/2022 No Yes I10 Essential (primary) hypertension 11/12/2022 No Yes [name], [name] ([id number]) [id number] Page 5 of 9 Inactive Problems Resolved Problems Electronic Signature(s) Signed: 11/12/2022 1:36:49 PM By: Worthy Keeler PA-C Entered By: Worthy Keeler on 11/12/2022 13:36:49 -------------------------------------------------------------------------------- Progress Note Details Patient Name: Date of Service: Calvin [name], Calvin RGE S. 11/12/2022 1:00 PM Medical Record Number: [id number] Patient Account Number: [id number] Date of Birth/Sex: Treating RN: Jan 05, 1933 (88 y.o. Seward Meth Primary Care Provider: Fulton Reek Other Clinician: Referring Provider: Treating Provider/Extender: Maurilio Lovely Weeks in Treatment: 0 Subjective Chief Complaint Information obtained from Patient Left 3rd T Ulcer and Callus Buildup oe History of Present Illness (HPI) 11-12-2022 upon evaluation today patient presents for initial inspection here in our clinic concerning a wound on his left [name] on the plantar aspect. With that being said based on what I am seeing I do believe that the patient is doing quite well as far as the overall appearance is concerned. There is a question as to whether or not this is actually open or not and will get have to remove some of the callus in order to determine this to be perfectly honest. This wound has been present it appears since somewhere around 30 October Dr. Posey Pronto has been taking care of this. When the patient's primary care provider, Dr. Fulton Reek, saw this he wanted the patient to come here for second opinion just to see if there is anything we can do to help speed up the healing process. 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 Patient History Information obtained from Patient,  Caregiver. Allergies penicillin (Severity: Moderate, Reaction: rash), gabapentin (Severity: Moderate, Reaction: bad side effects), codeine (Reaction: itching, nausea) Social History Never smoker, Marital Status - Married, Alcohol Use - Never, Drug Use - No History, Caffeine Use - Rarely. Medical History Cardiovascular Patient has history of Coronary Artery Disease, Hypertension Endocrine Denies history of Type I Diabetes, Type II Diabetes Neurologic Patient has history of Neuropathy Medical A Surgical History Notes nd Neurologic CVA Parkinsonism Review of Systems (ROS) Integumentary (Skin) Complains or has symptoms of Wounds. [name], [name] ([id number]) [id number] Page 6 of 9 Objective Constitutional patient is hypertensive.. pulse regular and within target range for patient.Marland Kitchen respirations regular, non-labored and within target range for patient.Marland Kitchen temperature within target range for patient.. Well-nourished and well-hydrated in no acute distress. Vitals  Time Taken: 1:14 PM, Height: 68 in, Source: Stated, Weight: 185 lbs, Source: Stated, BMI: 28.1, Temperature: 97.7 F, Pulse: 49 bpm, Respiratory Rate: 16 breaths/min, Blood Pressure: 148/70 mmHg. Eyes conjunctiva clear no eyelid edema noted. pupils equal round and reactive to light and accommodation. Ears, Nose, Mouth, and Throat no gross abnormality of ear auricles or external auditory canals. normal hearing noted during conversation. mucus membranes moist. Respiratory normal breathing without difficulty. Cardiovascular 2+ dorsalis pedis/posterior tibialis pulses. no clubbing, cyanosis, significant edema, Musculoskeletal normal gait and posture. no significant deformity or arthritic changes, no loss or range of motion, no clubbing. Psychiatric this patient is able to make decisions and demonstrates good insight into disease process. Alert and Oriented x 3. pleasant and cooperative. General Notes:  Upon inspection patient's wound did require sharp debridement to clearway some of the necrotic debris and callus over the surface of the wound. Again I was not certain there was a wound until I got this cleared away once I did it became apparent there was a very tiny opening where there was some slough and biofilm buildup on the surface I did perform debridement to clear this away once debrided the wound actually appears to be doing significantly better which is great news. Overall I think this is excellent news. I do think that it looks as if he does not have any signs of infection and my opinion is going to be that we try to get this healed I do not think he needs any additional x-rays he did have an x-ray with Dr. Posey Pronto in office which Dr. Posey Pronto commented on did not show any signs of osteomyelitis or bone degradation. Integumentary (Hair, Skin) Wound #1 status is Open. Original cause of wound was Gradually Appeared. The date acquired was: 10/01/2022. The wound is located on the Left,Plantar T oe Pollard. The wound measures 0.1cm length x 0.2cm width x 0.1cm depth; 0.016cm^2 area and 0.002cm^3 volume. There is Fat Layer (Subcutaneous Tissue) exposed. There is no tunneling or undermining noted. There is a medium amount of serosanguineous drainage noted. The wound margin is thickened. There is small (1-33%) red granulation within the wound bed. There is a small (1-33%) amount of necrotic tissue within the wound bed including Adherent Slough. 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 There was a Excisional  Skin/Subcutaneous oe Tissue Debridement with a total area of 0.02 sq cm performed by Tommie Sams., PA-C. With the following instrument(s): Curette to remove Non-Viable tissue/material. Material removed includes Callus, Subcutaneous Tissue, and Slough. No specimens were taken. A time out was conducted at 13:41, prior to the start of the procedure. There was no bleeding. The procedure was tolerated well. Post Debridement Measurements: 0.1cm length x 0.2cm width x 0.2cm depth; 0.003cm^3 volume. Character of Wound/Ulcer Post Debridement is stable. Post procedure Diagnosis Wound #1: Same as Pre-Procedure Plan Follow-up Appointments: Return Appointment in 1 week. Bathing/ Shower/ Hygiene: May shower; gently cleanse wound with antibacterial soap, rinse and pat dry prior to dressing wounds WOUND #1: - T Pollard Wound Laterality: Left, Posterior oe Cleanser: Byram Ancillary Kit - 15 Day Supply (DME) (Generic) 3 x Per Week/30 Days [name], [name] ([id number]) [id number] Page 7 of 9 Discharge  Instructions: Use supplies as instructed; Kit contains: (15) Saline Bullets; (15) 3x3 Gauze; 15 pr Gloves Cleanser: Soap and Water 3 x Per Week/30 Days Discharge Instructions: Gently cleanse wound with antibacterial soap, rinse and pat dry prior to dressing wounds Prim Dressing: Silvercel Small 2x2 (in/in) (DME) (Dispense As Written) 3 x Per Week/30 Days ary Discharge Instructions: Apply Silvercel Small 2x2 (in/in) as instructed Secondary Dressing: Gauze 3 x Per Week/30 Days Discharge Instructions: As directed: dry, moistened with saline or moistened with Dakins Solution Secured With: [employer] Dressing, Latex-free, Size 5, Small-Head / Shoulder / Thigh (DME) (Generic) 3 x Per Week/30 Days 1. Based on what I see I do believe that the patient would benefit currently from going ahead and initiating treatment with a silver alginate dressing which I think should do a quite good job here  for him. 2. I am also can recommend that we have the patient continue with gauze to cover and then roll gauze to secure in place stretch net to hold everything where it needs to be. 3. He will continue with the postop offloading shoe which I think is still good to use right now. We will see patient back for reevaluation in 1 week here in the clinic. If anything worsens or changes patient will contact our office for additional recommendations. Electronic Signature(s) Signed: 11/12/2022 2:36:26 PM By: Worthy Keeler PA-C Entered By: Worthy Keeler on 11/12/2022 14:36:26 -------------------------------------------------------------------------------- ROS/PFSH Details Patient Name: Date of Service: Calvin [name], Calvin RGE S. 11/12/2022 1:00 PM Medical Record Number: [id number] Patient Account Number: [id number] Date of Birth/Sex: Treating RN: 09-Oct-1933 (88 y.o. Seward Meth Primary Care Provider: Fulton Reek Other Clinician: Referring Provider: Treating Provider/Extender: Rupert Stacks in Treatment: 0 Information Obtained From Patient Caregiver Integumentary (Skin) Complaints and Symptoms: Positive for: Wounds Cardiovascular Medical History: Positive for: Coronary Artery Disease; Hypertension Endocrine Medical History: Negative for: Type I Diabetes; Type II Diabetes Neurologic Medical History: Positive for: Neuropathy Past Medical History Notes: CVA Parkinsonism Immunizations Pneumococcal Vaccine: Received Pneumococcal Vaccination: Yes Received Pneumococcal Vaccination On or After [address], RETZLOFF ([id number]) [id number] Page 8 of 9 Implantable Devices None Family and Social History Never smoker; Marital Status - Married; Alcohol Use: Never; Drug Use: No History; Caffeine Use: Rarely Electronic Signature(s) Signed: 11/12/2022 3:51:57 PM By: Rosalio Loud MSN RN CNS WTA Signed: 11/12/2022 5:07:14 PM By:  Worthy Keeler PA-C Entered By: Rosalio Loud on 11/12/2022 13:30:08 -------------------------------------------------------------------------------- SuperBill Details Patient Name: Date of Service: [name], Calvin RGE S. 11/12/2022 Medical Record Number: [id number] Patient Account Number: [id number] Date of Birth/Sex: Treating RN: 1933-10-28 (88 y.o. 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 L84 Corns and callosities G20.C Parkinsonism, unspecified I25.10 Atherosclerotic heart disease of native coronary artery without angina pectoris I10 Essential (primary) hypertension Facility Procedures : CPT4 Code: [id number] 1 Description: 5400 - DEB SUBQ TISSUE 20 SQ CM/< ICD-10 Diagnosis Description L97.522 Non-pressure chronic ulcer of other part of left foot with fat layer exposed Modifier: Quantity: 1 Physician Procedures : CPT4 Code Description Modifier [id number] WC PHYS LEVEL 3 NEW PT 25 ICD-10 Diagnosis Description 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

## 2022-11-15 ENCOUNTER — Encounter: Payer: Medicare Other | Admitting: Speech Pathology

## 2022-11-20 ENCOUNTER — Ambulatory Visit: Payer: Medicare Other | Admitting: Podiatry

## 2022-11-20 ENCOUNTER — Encounter: Payer: Medicare Other | Admitting: Speech Pathology

## 2022-11-22 ENCOUNTER — Encounter: Payer: Medicare Other | Admitting: Physician Assistant

## 2022-11-22 ENCOUNTER — Encounter: Payer: Medicare Other | Admitting: Speech Pathology

## 2022-11-22 DIAGNOSIS — L97522 Non-pressure chronic ulcer of other part of left foot with fat layer exposed: Secondary | ICD-10-CM | POA: Diagnosis not present

## 2022-11-22 NOTE — Progress Notes (Signed)
[name] ([id number]) [id number] Page 1 of 8 Visit Report for 11/22/2022 Arrival Information Details Patient Name: Date of Service: [name] [facility] S. 11/22/2022 3:15 PM Medical Record Number: [id number] Patient Account Number: [id number] Date of Birth/Sex: Treating RN: [birth year] (88 y.o. [name] Primary Care Marcelline Temkin: Aram Beecham Other Clinician: Referring Floretta Petro: Treating Demitrus Francisco/Extender: Gabriel Earing in Treatment: 1 Visit Information History Since Last Visit Added or deleted any medications: No Patient Arrived: Gilmer Mor Any new allergies or adverse reactions: No Arrival Time: 15:20 Had a fall or experienced change in No Accompanied By: daughter activities of daily living that may affect Transfer Assistance: None risk of falls: Patient Requires Transmission-Based Precautions: No Signs or symptoms of abuse/neglect since last visito No Patient Has Alerts: Yes Hospitalized since last visit: No Patient Alerts: NOT Diabetic Implantable device outside of the clinic excluding No cellular tissue based products placed in the center since last visit: Has Dressing in Place as Prescribed: Yes Pain Present Now: No Electronic Signature(s) Signed: 11/22/2022 3:51:33 PM By: Midge Aver MSN RN CNS WTA Entered By: Midge Aver on 11/22/2022 15:22:03 -------------------------------------------------------------------------------- Clinic Level of Care Assessment Details Patient Name: Date of Service: Mare Ferrari, Calvin RGE S. 11/22/2022 3:15 PM Medical Record Number: [id number] Patient Account Number: [id number] Date of Birth/Sex: Treating RN: 03-21-1933 (88 y.o. [name] Primary Care [name]: Aram Beecham Other Clinician: Referring Alexis Mizuno: Treating Harlow Carrizales/Extender: Gabriel Earing in Treatment: 1 Clinic Level of Care Assessment Items TOOL 4 Quantity Score X- 1 0 Use when only an EandM is  performed on FOLLOW-UP visit ASSESSMENTS - Nursing Assessment / Reassessment X- 1 10 Reassessment of Co-morbidities (includes updates in patient status) X- 1 5 Reassessment of Adherence to Treatment Plan [name], [name] ([id number]) [id number] Page 2 of 8 ASSESSMENTS - Wound and Skin A ssessment / Reassessment X - Simple Wound Assessment / Reassessment - one wound 1 5 []  - 0 Complex Wound Assessment / Reassessment - multiple wounds []  - 0 Dermatologic / Skin Assessment (not related to wound area) ASSESSMENTS - Focused Assessment []  - 0 Circumferential Edema Measurements - multi extremities []  - 0 Nutritional Assessment / Counseling / Intervention []  - 0 Lower Extremity Assessment (monofilament, tuning fork, pulses) []  - 0 Peripheral Arterial Disease Assessment (using hand held doppler) ASSESSMENTS - Ostomy and/or Continence Assessment and Care []  - 0 Incontinence Assessment and Management []  - 0 Ostomy Care Assessment and Management (repouching, etc.) PROCESS - Coordination of Care X - Simple Patient / Family Education for ongoing care 1 15 []  - 0 Complex (extensive) Patient / Family Education for ongoing care X- 1 10 Staff obtains , Records, T Results / Process Orders est []  - 0 Staff telephones HHA, Nursing Homes / Clarify orders / etc []  - 0 Routine Transfer to another Facility (non-emergent condition) []  - 0 Routine Hospital Admission (non-emergent condition) []  - 0 New Admissions / / Ordering NPWT Apligraf, etc. , []  - 0 Emergency Hospital Admission (emergent condition) X- 1 10 Simple Discharge Coordination []  - 0 Complex (extensive) Discharge Coordination PROCESS - Special Needs []  - 0 Pediatric / Minor Patient Management []  - 0 Isolation Patient Management []  - 0 Hearing / Language / Visual special needs []  - 0 Assessment of Community assistance (transportation, D/C planning, etc.) []  -  0 Additional assistance / Altered mentation []  - 0 Support Surface(s) Assessment (bed, cushion, seat, etc.) INTERVENTIONS - Wound Cleansing / Measurement X - Simple Wound  Cleansing - one wound 1 5 []  - 0 Complex Wound Cleansing - multiple wounds X- 1 5 Wound Imaging (photographs - any number of wounds) []  - 0 Wound Tracing (instead of photographs) X- 1 5 Simple Wound Measurement - one wound []  - 0 Complex Wound Measurement - multiple wounds INTERVENTIONS - Wound Dressings []  - 0 Small Wound Dressing one or multiple wounds []  - 0 Medium Wound Dressing one or multiple wounds []  - 0 Large Wound Dressing one or multiple wounds []  - 0 Application of Medications - topical []  - 0 Application of Medications - injection INTERVENTIONS - Miscellaneous []  - 0 External ear exam [name], [name] ( ) [id number] Page 3 of 8 []  - 0 Specimen Collection (cultures, biopsies, blood, body fluids, etc.) []  - 0 Specimen(s) / Culture(s) sent or taken to Lab for analysis []  - 0 Patient Transfer (multiple staff / Lift / Similar devices) []  - 0 Simple Staple / Suture removal (25 or less) []  - 0 Complex Staple / Suture removal (26 or more) []  - 0 Hypo / Hyperglycemic Management (close monitor of Blood Glucose) []  - 0 Ankle / Brachial Index (ABI) - do not check if billed separately X- 1 5 Vital Signs Has the patient been seen at the hospital within the last three years: Yes Total Score: 75 Level Of Care: New/Established - Level 2 Electronic Signature(s) Signed: 11/22/2022 3:51:33 PM By: MSN RN CNS WTA Entered By: on 11/22/2022 15:37:08 -------------------------------------------------------------------------------- Encounter Discharge Information Details Patient Name: Date of Service: , Calvin RGE S. 11/22/2022 3:15 PM Medical Record Number: [id number] Patient Account Number: 12-23-2005 Date of Birth/Sex: Treating  RN: 21-Sep-1933 (88 y.o. Primary Care Shelagh Rayman: Michiel Sites Other Clinician: Referring Ianna Salmela: Treating Renata Gambino/Extender: in Treatment: 1 Encounter Discharge Information Items Discharge Condition: Stable Ambulatory Status: Cane Discharge Destination: Home Transportation: Private Auto Accompanied By: daughter Schedule Follow-up Appointment: No Clinical Summary of Care: Electronic Signature(s) Signed: 11/22/2022 3:51:33 PM By: MSN RN CNS WTA Entered By: on 11/22/2022 15:38:32 -------------------------------------------------------------------------------- Lower Extremity Assessment Details Patient Name: Date of Service: BA SO N, Calvin RGE S. 11/22/2022 3:15 PM Midge Aver (12/23/2023Mare Ferrari.pdf Page 4 of 8 Medical Record Number: 11/24/2022 Patient Account Number: [id number] Date of Birth/Sex: Treating RN: 1933-06-23 (88 y.o. 88 Primary Care Kashus Karlen: [name] Other Clinician: Referring Ketrina Boateng: Treating Mande Auvil/Extender: Aram Beecham Weeks in Treatment: 1 Electronic Signature(s) Signed: 11/22/2022 3:51:33 PM By: 11/24/2022 MSN RN CNS WTA Entered By: Midge Aver on 11/22/2022 15:34:27 -------------------------------------------------------------------------------- Multi Wound Chart Details Patient Name: Date of Service: 11/24/2022, Calvin RGE S. 11/22/2022 3:15 PM Medical Record Number: Dulcy Fanny Patient Account Number: [id number] Date of Birth/Sex: Treating RN: 12-15-1932 (88 y.o. [id number] Primary Care Sho Salguero: 12/04/1933 Other Clinician: Referring Skylar Flynt: Treating Giles Currie/Extender: 89 Weeks in Treatment: 1 Vital Signs Height(in): 68 Pulse(bpm): 53 Weight(lbs): 185 Blood Pressure(mmHg): 130/58 Body Mass Index(BMI): 28.1 Temperature(F): 98.1 Respiratory Rate(breaths/min):  16 [1:Photos:] [N/A:N/A] Left, Plantar T Third oe N/A N/A Wound Location: Gradually Appeared N/A N/A Wounding Event: Neuropathic Ulcer-Non Diabetic N/A N/A Primary Etiology: Coronary Artery Disease, N/A N/A Comorbid History: Hypertension, Neuropathy 10/01/2022 N/A N/A Date Acquired: 1 N/A N/A Weeks of Treatment: Healed - Epithelialized N/A N/A Wound Status: No N/A N/A Wound Recurrence: 0x0x0 N/A N/A Measurements L x W x D (cm) 0 N/A N/A A (cm) : rea 0 N/A N/A  Volume (cm) : 100.00% N/A N/A % Reduction in Area: 100.00% N/A N/A % Reduction in Volume: Full Thickness Without Exposed N/A N/A Classification: Support Structures Medium N/A N/A Exudate Amount: Serosanguineous N/A N/A Exudate Type: red, brown N/A N/A Exudate Color: Thickened N/A N/A Wound Margin: Small (1-33%) N/A N/A Granulation Amount: Red N/A N/A Granulation Quality: Small (1-33%) N/A N/A Necrotic Amount: Fat Layer (Subcutaneous Tissue): Yes N/A N/A Exposed Structures: Fascia: No Tendon: No CRISTOPHER, CICCARELLI ([id number]) [id number] Page 5 of 8 Muscle: No Joint: No Bone: No Small (1-33%) N/A N/A Epithelialization: Treatment Notes Electronic Signature(s) Signed: 11/22/2022 3:51:33 PM By: Midge Aver MSN RN CNS WTA Entered By: Midge Aver on 11/22/2022 15:34:32 -------------------------------------------------------------------------------- Multi-Disciplinary Care Plan Details Patient Name: Date of Service: Mare Ferrari, Calvin RGE S. 11/22/2022 3:15 PM Medical Record Number: [id number] Patient Account Number: [id number] Date of Birth/Sex: Treating RN: [birth year] (88 y.o. [name] Primary Care Shlonda Dolloff: Aram Beecham Other Clinician: Referring Vertis Scheib: Treating Emarie Paul/Extender: Hermine Messick Weeks in Treatment: 1 Active Inactive Electronic Signature(s) Signed: 11/22/2022 3:51:33 PM By: Midge Aver MSN RN CNS WTA Entered By: Midge Aver on  11/22/2022 15:37:45 -------------------------------------------------------------------------------- Pain Assessment Details Patient Name: Date of Service: Mare Ferrari, Calvin RGE S. 11/22/2022 3:15 PM Medical Record Number: [id number] Patient Account Number: [id number] Date of Birth/Sex: Treating RN: Sep 01, 1933 (88 y.o. [name] Primary Care Zaul Hubers: Aram Beecham Other Clinician: Referring Maggie Dworkin: Treating Danial Sisley/Extender: Hermine Messick Weeks in Treatment: 1 Active Problems Location of Pain Severity and Description of Pain Patient Has Paino No Site Locations RUBEN, MAHLER ([id number]) [id number] Page 6 of 8 Pain Management and Medication Current Pain Management: Electronic Signature(s) Signed: 11/22/2022 3:51:33 PM By: Midge Aver MSN RN CNS WTA Entered By: Midge Aver on 11/22/2022 15:26:25 -------------------------------------------------------------------------------- Patient/Caregiver Education Details Patient Name: Date of Service: Mare Ferrari, Calvin RGE S. 12/21/2023andnbsp3:15 PM Medical Record Number: [id number] Patient Account Number: [id number] Date of Birth/Gender: Treating RN: Feb 15, 1933 (88 y.o. [name] Primary Care Physician: Aram Beecham Other Clinician: Referring Physician: Treating Physician/Extender: Gabriel Earing in Treatment: 1 Education Assessment Education Provided To: Patient Education Topics Provided Wound/Skin Impairment: Handouts: Caring for Your Ulcer Methods: Explain/Verbal Responses: State content correctly Electronic Signature(s) Signed: 11/22/2022 3:51:33 PM By: Midge Aver MSN RN CNS WTA Entered By: Midge Aver on 11/22/2022 15:37:27 Dulcy Fanny ([id number]) [id number] Page 7 of 8 -------------------------------------------------------------------------------- Wound Assessment Details Patient Name: Date of Service: Mare Ferrari, Calvin  RGE S. 11/22/2022 3:15 PM Medical Record Number: [id number] Patient Account Number: [id number] Date of Birth/Sex: Treating RN: 01-09-1933 (88 y.o. [name] Primary Care Haislee Corso: Aram Beecham Other Clinician: Referring Rashaad Hallstrom: Treating Manning Luna/Extender: Hermine Messick Weeks in Treatment: 1 Wound Status Wound Number: 1 Primary Etiology: Neuropathic Ulcer-Non Diabetic Wound Location: Left, Plantar T Third oe Wound Status: Healed - Epithelialized Wounding Event: Gradually Appeared Comorbid History: Coronary Artery Disease, Hypertension, Neuropathy Date Acquired: 10/01/2022 Weeks Of Treatment: 1 Clustered Wound: No Photos Wound Measurements Length: (cm) Width: (cm) Depth: (cm) Area: (cm) Volume: (cm) 0 % Reduction in Area: 100% 0 % Reduction in Volume: 100% 0 Epithelialization: Small (1-33%) 0 0 Wound Description Classification: Full Thickness Without Exposed Support Wound Margin: Thickened Exudate Amount: Medium Exudate Type: Serosanguineous Exudate Color: red, brown Structures Foul Odor After Cleansing: No Slough/Fibrino No Wound Bed Granulation Amount: Small (1-33%) Exposed Structure Granulation Quality: Red Fascia Exposed: No Necrotic Amount: Small (1-33%) Fat Layer (Subcutaneous Tissue) Exposed: Yes Tendon Exposed: No  Muscle Exposed: No Joint Exposed: No Bone Exposed: No Treatment Notes Wound #1 (Toe Third) Wound Laterality: Plantar, Left Cleanser Peri-Wound Care Dulcy FannyBASON, Rhyker S ([id number]) [id number] Page 8 of 8 Topical Primary Dressing Secondary Dressing Secured With Compression Wrap Compression Stockings Add-Ons Electronic Signature(s) Signed: 11/22/2022 3:51:33 PM By: Midge AverSmith, Vicki MSN RN CNS WTA Entered By: Midge AverSmith, Vicki on 11/22/2022 15:34:22 -------------------------------------------------------------------------------- Vitals Details Patient Name: Date of Service: Mare FerrariBA SO N, Calvin RGE S.  11/22/2022 3:15 PM Medical Record Number: [id number] Patient Account Number: [id number] Date of Birth/Sex: Treating RN: 09/30/1933 [age] y.o. Calvin CluckM) Smith, Vicki Primary Care Mckenley Birenbaum: Aram BeechamSparks, Jeffrey Other Clinician: Referring Kaydyn Chism: Treating Makynlee Kressin/Extender: Hermine MessickStone, Hoyt Sparks, Jeffrey Weeks in Treatment: 1 Vital Signs Time Taken: 15:22 Temperature (F): 98.1 Height (in): 68 Pulse (bpm): 53 Weight (lbs): 185 Respiratory Rate (breaths/min): 16 Body Mass Index (BMI): 28.1 Blood Pressure (mmHg): 130/58 Reference Range: 80 - 120 mg / dl Electronic Signature(s) Signed: 11/22/2022 3:51:33 PM By: Midge AverSmith, Vicki MSN RN CNS WTA Entered By: Midge AverSmith, Vicki on 11/22/2022 15:26:19

## 2022-11-22 NOTE — Progress Notes (Addendum)
CORDERA, STINEMAN ([id number]) [id number] Page 1 of 6 Visit Report for 11/22/2022 Chief Complaint Document Details Patient Name: Date of Service: [name], [name] 11/22/2022 3:15 PM Medical Record Number: [id number] Patient Account Number: [id number] Date of Birth/Sex: Treating RN: 1933-08-24 (88 y.o. [name] Primary Care Provider: Aram Beecham Other Clinician: Referring Provider: Treating Provider/Extender: Gabriel Earing in Treatment: 1 Information Obtained from: Patient Chief Complaint Left 3rd T Ulcer and Callus Buildup oe Electronic Signature(s) Signed: 11/22/2022 3:31:38 PM By: Lenda Kelp PA-C Entered By: Lenda Kelp on 11/22/2022 15:31:38 -------------------------------------------------------------------------------- HPI Details Patient Name: Date of Service: [name], [name] 11/22/2022 3:15 PM Medical Record Number: [id number] Patient Account Number: [id number] Date of Birth/Sex: Treating RN: Apr 12, 1933 (88 y.o. [name] Primary Care Provider: Aram Beecham Other Clinician: Referring Provider: Treating Provider/Extender: Hermine Messick Weeks in Treatment: 1 History of Present Illness HPI Description: 11-12-2022 upon evaluation today patient presents for initial inspection here in our clinic concerning a wound on his left third toe on the plantar aspect. With that being said based on what I am seeing I do believe that the patient is doing quite well as far as the overall appearance is concerned. There is a question as to whether or not this is actually open or not and will get have to remove some of the callus in order to determine this to be perfectly honest. This wound has been present it appears since somewhere around 30 October Dr. Allena Katz has been taking care of this. When the patient's primary care provider, Dr. Aram Beecham, saw this he wanted the patient to come here for  second opinion just to see if there is anything we can do to help speed up the healing process. 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 11-22-2022 upon evaluation today patient appears to be doing well currently in regard to his toe which actually appears to be completely healed. I do not see anything that looks to be open or draining which is good news and overall I do think that he has done extremely well in the past week and a half. Electronic Signature(s) Signed: 11/22/2022 3:38:24 PM By: Gregary Signs ([id number]) [id number] Page 2 of 6 Entered By: Lenda Kelp on 11/22/2022 15:38:24 -------------------------------------------------------------------------------- Physical Exam Details Patient Name: Date of Service: [name], [name] 11/22/2022 3:15 PM Medical Record Number: [id number] Patient Account Number: [id number] Date of Birth/Sex: Treating RN: 1933-06-10 (88 y.o. [name] Primary Care Provider: Aram Beecham Other Clinician: Referring Provider: Treating Provider/Extender: Hermine Messick Weeks in Treatment: 1 Constitutional Well-nourished and well-hydrated in no acute distress. Respiratory normal breathing without difficulty. Psychiatric this patient is able to make decisions and demonstrates good insight into disease process. Alert and Oriented x 3. pleasant and cooperative. Notes Upon inspection patient's wound bed actually showed signs of good epithelization. I do not see anything open and overall I do believe he is headed in the right direction here. No fevers, chills, nausea, vomiting, or diarrhea. Electronic Signature(s) Signed: 11/22/2022 3:38:38 PM By: Lenda Kelp PA-C Entered By: Lenda Kelp on 11/22/2022  15:38:38 -------------------------------------------------------------------------------- Physician Orders Details Patient Name: Date of Service: [name], [name] 11/22/2022 3:15 PM Medical Record Number: [id number] Patient Account Number: [id number] Date of Birth/Sex: Treating RN: 1933/01/08 (88 y.o. 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 Follow up with podiatrist for toenail. Call if any reddens, gets hot, or looks infected Electronic Signature(s) Signed: 11/22/2022 3:51:33 PM By: Midge Aver MSN RN CNS WTA Signed: 11/22/2022 4:23:52 PM By: Lenda Kelp PA-C Entered By: Midge Aver on 11/22/2022 15:36:31 -------------------------------------------------------------------------------- Problem List Details Patient Name: Date of Service: [name], [name] 11/22/2022 3:15 PM Medical Record Number: [id number] Patient Account Number: [id number] Date of Birth/Sex: Treating RN: 04-09-1933 (88 y.o. [name] Primary Care Provider: Aram Beecham Other Clinician: Referring Provider: Treating Provider/Extender: Hermine Messick Weeks in Treatment: 1 Active Problems ICD-10 Encounter Code Description Active Date MDM Diagnosis G60.3 Idiopathic  progressive neuropathy 11/12/2022 No Yes L97.522 Non-pressure chronic ulcer of other part of left foot with fat layer exposed 11/12/2022 No Yes L84 Corns and callosities 11/12/2022 No Yes G20.C Parkinsonism, unspecified 11/12/2022 No Yes I25.10 Atherosclerotic heart disease of native coronary artery without angina pectoris 11/12/2022 No Yes I10 Essential (primary) hypertension 11/12/2022 No Yes Inactive Problems Resolved Problems Electronic Signature(s) Signed: 11/22/2022 3:31:34 PM By: Lenda Kelp PA-C Entered By: Lenda Kelp on 11/22/2022 15:31:34 Dulcy Fanny ([id number]) [id number] Page 4 of 6 -------------------------------------------------------------------------------- Progress Note Details Patient Name: Date of Service: [name], [name] 11/22/2022 3:15 PM Medical Record Number: [id number] Patient Account Number: [id number] Date of Birth/Sex: Treating RN: 25-Sep-1933 (88 y.o. [name] Primary Care Provider: Aram Beecham Other Clinician: Referring Provider: Treating Provider/Extender: Hermine Messick Weeks in Treatment: 1 Subjective Chief Complaint Information obtained from Patient Left 3rd T Ulcer and Callus Buildup oe History of Present Illness (HPI) 11-12-2022 upon evaluation today patient presents for initial inspection here in our clinic concerning a wound on his left third toe on the plantar aspect. With that being said based on what I am seeing I do believe that the patient is doing quite well as far as the overall appearance is concerned. There is a question as to whether or not this is actually open or not and will get have to remove some of the callus in order to determine this to be perfectly honest. This wound has been present it appears since somewhere around 30 October Dr. Allena Katz has been taking care of this. When the patient's primary care provider, Dr. Aram Beecham, saw this he wanted the patient  to come here for second opinion just to see if there is anything we can do to help speed up the healing process. 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 11-22-2022 upon evaluation today patient appears to be doing well currently in regard to his toe which actually appears to be completely healed. I do not see anything that looks to be open or draining which is good news and overall I do think that he has done extremely well in the past week and a half. Objective Constitutional Well-nourished and well-hydrated  in no acute distress. Vitals Time Taken: 3:22 PM, Height: 68 in, Weight: 185 lbs, BMI: 28.1, Temperature: 98.1 F, Pulse: 53 bpm, Respiratory Rate: 16 breaths/min, Blood Pressure: 130/58 mmHg. Respiratory normal breathing without difficulty. Psychiatric this patient is able to make decisions and demonstrates good insight into disease process. Alert and Oriented x 3. pleasant and cooperative. General Notes: Upon inspection patient's wound bed actually showed signs of good epithelization. I do not see anything open and overall I do believe he is headed in the right direction here. No fevers, chills, nausea, vomiting, or diarrhea. Integumentary (Hair, Skin) Wound #1 status is Healed - Epithelialized. Original cause of wound was Gradually Appeared. The date acquired was: 10/01/2022. The wound has been in treatment 1 weeks. The wound is located on the Left,Plantar T Third. The wound measures 0cm length x 0cm width x 0cm depth; 0cm^2 area and 0cm^3 oe volume. There is Fat Layer (Subcutaneous Tissue) exposed. There is a medium amount of serosanguineous drainage noted. The wound margin is thickened. There is small (1-33%) red granulation within the wound bed. There is a small (1-33%) amount of necrotic tissue within the wound bed. Assessment Active Problems ICD-10 Idiopathic progressive neuropathy Dulcy FannyBASON, Echo  S ([id number]) [id number] Page 5 of 6 Non-pressure chronic ulcer of other part of left foot with fat layer exposed Corns and callosities Parkinsonism, unspecified Atherosclerotic heart disease of native coronary artery without angina pectoris Essential (primary) hypertension Plan [facility] Treatment Complete - Use A and D ointment daily for dry skin. Follow up with podiatrist for toenail. Call if any reddens, gets hot, or looks infected 1. I am going to recommend that we have him use AandD ointment over the feet in general including the toe in order to keep the skin supple and soft and prevent callus buildup. 2. I am also going to recommend that he needs to be checking on his feet every single night to make sure that this toe is doing okay and that there is no other breakdown anywhere on his foot if he starts to develop any callus buildup he should see podiatry. I also think he probably needs to see them for nail trimming. Will see him back for follow-up visit as needed. Electronic Signature(s) Signed: 11/22/2022 3:39:13 PM By: Lenda KelpStone III, Brissa Asante PA-C Entered By: Lenda KelpStone III, Weylin Plagge on 11/22/2022 15:39:13 -------------------------------------------------------------------------------- SuperBill Details Patient Name: Date of Service: Calvin FerrariBA SO N, [name] 11/22/2022 Medical Record Number: [id number] Patient Account Number: [id number] Date of Birth/Sex: Treating RN: [birth year] (88 y.o. 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 L84 Corns and callosities G20.C Parkinsonism, unspecified I25.10 Atherosclerotic heart  disease of native coronary artery without angina pectoris I10 Essential (primary) hypertension Facility Procedures : CPT4 Code: [id number] Description: [id number] - WOUND CARE VISIT-LEV 2 EST PT Modifier: Quantity: 1 Physician Procedures : CPT4 Code Description Modifier [id number] - WC PHYS LEVEL 3 - EST PT ICD-10 Diagnosis Description 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

## 2022-12-06 ENCOUNTER — Ambulatory Visit: Payer: Medicare Other | Admitting: Podiatry

## 2023-01-06 ENCOUNTER — Emergency Department
Admission: EM | Admit: 2023-01-06 | Discharge: 2023-01-06 | Disposition: A | Discharge status: Home / Self Care | Payer: Medicare Other | Source: Home / Self Care | Attending: Emergency Medicine | Admitting: Emergency Medicine

## 2023-01-06 ENCOUNTER — Emergency Department: Payer: Medicare Other

## 2023-01-06 ENCOUNTER — Other Ambulatory Visit: Payer: Self-pay

## 2023-01-06 DIAGNOSIS — I2510 Atherosclerotic heart disease of native coronary artery without angina pectoris: Secondary | ICD-10-CM | POA: Insufficient documentation

## 2023-01-06 DIAGNOSIS — W19XXXA Unspecified fall, initial encounter: Secondary | ICD-10-CM

## 2023-01-06 DIAGNOSIS — Z1152 Encounter for screening for COVID-19: Secondary | ICD-10-CM | POA: Insufficient documentation

## 2023-01-06 DIAGNOSIS — K922 Gastrointestinal hemorrhage, unspecified: Secondary | ICD-10-CM | POA: Diagnosis not present

## 2023-01-06 DIAGNOSIS — Z79899 Other long term (current) drug therapy: Secondary | ICD-10-CM | POA: Insufficient documentation

## 2023-01-06 DIAGNOSIS — I10 Essential (primary) hypertension: Secondary | ICD-10-CM | POA: Insufficient documentation

## 2023-01-06 DIAGNOSIS — Z23 Encounter for immunization: Secondary | ICD-10-CM | POA: Insufficient documentation

## 2023-01-06 DIAGNOSIS — R112 Nausea with vomiting, unspecified: Secondary | ICD-10-CM

## 2023-01-06 DIAGNOSIS — Z7982 Long term (current) use of aspirin: Secondary | ICD-10-CM | POA: Insufficient documentation

## 2023-01-06 DIAGNOSIS — R197 Diarrhea, unspecified: Secondary | ICD-10-CM | POA: Insufficient documentation

## 2023-01-06 DIAGNOSIS — S0990XA Unspecified injury of head, initial encounter: Secondary | ICD-10-CM

## 2023-01-06 DIAGNOSIS — Z85828 Personal history of other malignant neoplasm of skin: Secondary | ICD-10-CM | POA: Insufficient documentation

## 2023-01-06 DIAGNOSIS — K254 Chronic or unspecified gastric ulcer with hemorrhage: Secondary | ICD-10-CM | POA: Diagnosis not present

## 2023-01-06 LAB — HEPATIC FUNCTION PANEL
ALT: 23 U/L (ref 0–44)
AST: 22 U/L (ref 15–41)
Albumin: 3.7 g/dL (ref 3.5–5.0)
Alkaline Phosphatase: 66 U/L (ref 38–126)
Bilirubin, Direct: 0.4 mg/dL — ABNORMAL HIGH (ref 0.0–0.2)
Indirect Bilirubin: 1.4 mg/dL — ABNORMAL HIGH (ref 0.3–0.9)
Total Bilirubin: 1.8 mg/dL — ABNORMAL HIGH (ref 0.3–1.2)
Total Protein: 5.9 g/dL — ABNORMAL LOW (ref 6.5–8.1)

## 2023-01-06 LAB — URINALYSIS, ROUTINE W REFLEX MICROSCOPIC
Bilirubin Urine: NEGATIVE
Glucose, UA: NEGATIVE mg/dL
Hgb urine dipstick: NEGATIVE
Ketones, ur: 5 mg/dL — AB
Leukocytes,Ua: NEGATIVE
Nitrite: NEGATIVE
Protein, ur: NEGATIVE mg/dL
Specific Gravity, Urine: 1.033 — ABNORMAL HIGH (ref 1.005–1.030)
pH: 5 (ref 5.0–8.0)

## 2023-01-06 LAB — CBC
HCT: 33.9 % — ABNORMAL LOW (ref 39.0–52.0)
Hemoglobin: 11.6 g/dL — ABNORMAL LOW (ref 13.0–17.0)
MCH: 31.9 pg (ref 26.0–34.0)
MCHC: 34.2 g/dL (ref 30.0–36.0)
MCV: 93.1 fL (ref 80.0–100.0)
Platelets: 127 10*3/uL — ABNORMAL LOW (ref 150–400)
RBC: 3.64 MIL/uL — ABNORMAL LOW (ref 4.22–5.81)
RDW: 13.2 % (ref 11.5–15.5)
WBC: 10.4 10*3/uL (ref 4.0–10.5)
nRBC: 0 % (ref 0.0–0.2)

## 2023-01-06 LAB — BASIC METABOLIC PANEL
Anion gap: 10 (ref 5–15)
BUN: 62 mg/dL — ABNORMAL HIGH (ref 8–23)
CO2: 20 mmol/L — ABNORMAL LOW (ref 22–32)
Calcium: 8.4 mg/dL — ABNORMAL LOW (ref 8.9–10.3)
Chloride: 102 mmol/L (ref 98–111)
Creatinine, Ser: 1.11 mg/dL (ref 0.61–1.24)
GFR, Estimated: >60 mL/min (ref >60)
Glucose, Bld: 141 mg/dL — ABNORMAL HIGH (ref 70–99)
Potassium: 4.3 mmol/L (ref 3.5–5.1)
Sodium: 132 mmol/L — ABNORMAL LOW (ref 135–145)

## 2023-01-06 LAB — TROPONIN I (HIGH SENSITIVITY)
Troponin I (High Sensitivity): 12 ng/L (ref <18)
Troponin I (High Sensitivity): 13 ng/L (ref <18)

## 2023-01-06 LAB — RESP PANEL BY RT-PCR (RSV, FLU A&B, COVID)  RVPGX2
Influenza A by PCR: NEGATIVE
Influenza B by PCR: NEGATIVE
Resp Syncytial Virus by PCR: NEGATIVE
SARS Coronavirus 2 by RT PCR: NEGATIVE

## 2023-01-06 LAB — LIPASE, BLOOD: Lipase: 41 U/L (ref 11–51)

## 2023-01-06 MED ORDER — ONDANSETRON HCL 4 MG/2ML IJ SOLN
4.0000 mg | Freq: Once | INTRAMUSCULAR | Status: AC
  Administered 2023-01-06: 4 mg via INTRAVENOUS
  Filled 2023-01-06: qty 2

## 2023-01-06 MED ORDER — TETANUS-DIPHTH-ACELL PERTUSSIS 5-2.5-18.5 LF-MCG/0.5 IM SUSY
0.5000 mL | PREFILLED_SYRINGE | Freq: Once | INTRAMUSCULAR | Status: AC
  Administered 2023-01-06: 0.5 mL via INTRAMUSCULAR
  Filled 2023-01-06: qty 0.5

## 2023-01-06 MED ORDER — ONDANSETRON 4 MG PO TBDP: 4.0000 mg | ORAL_TABLET | Freq: Four times a day (QID) | ORAL | 0 refills | Status: DC | PRN

## 2023-01-06 MED ORDER — SODIUM CHLORIDE 0.9 % IV BOLUS (SEPSIS)
1000.0000 mL | Freq: Once | INTRAVENOUS | Status: AC
  Administered 2023-01-06: 1000 mL via INTRAVENOUS

## 2023-01-06 MED ORDER — IOHEXOL 300 MG/ML  SOLN
100.0000 mL | Freq: Once | INTRAMUSCULAR | Status: AC | PRN
  Administered 2023-01-06: 100 mL via INTRAVENOUS

## 2023-01-06 NOTE — ED Notes (Addendum)
Pt used urinal with assistance, 385ml. Urine specimen collected and sent to lab

## 2023-01-06 NOTE — ED Notes (Signed)
Swab sent to lab [id number]

## 2023-01-06 NOTE — Discharge Instructions (Addendum)
You may take over-the-counter Tylenol 1000 mg every 6 hours as needed for fever, pain.  I recommend a bland diet for the next several days but please increase your fluid intake.  You may take Imodium 2 mg tablets 1-2 times a day as needed for diarrhea for the next 2 to 3 days.

## 2023-01-06 NOTE — ED Triage Notes (Addendum)
Pt comes from home via [facility] c/o fall. Per EMS, pt has been feeling sick all of today, hasn't really eaten or been drinking much, feeling nauseous and vomited once. Pt went to lie down and get some rest. After resting pt went to get up, felt dizzy, fell, and hit back of head on piece of furniture, denies any LOC. Pt has abrasion to back of head. Takes aspirin. Hx of previous stroke with residual neuro deficits like trouble getting his words out. Pt A&Ox4

## 2023-01-06 NOTE — ED Provider Notes (Signed)
[facility] Provider Note    Event Date/Time   First MD Initiated Contact with Patient 01/06/23 0115     (approximate)   History   Fall   HPI  Calvin Suski. is a 89 y.o. male with history of previous CVA on aspirin, hypertension, hyperlipidemia, CAD who presents to the emergency department complaints of not feeling well today.  Had 1 episode of vomiting and diarrhea today.  No chest pain, shortness of breath, abdominal pain, new numbness, tingling, focal weakness.  States he did feel dizzy tonight and hit his head.  No loss of consciousness.  Has an abrasion to the posterior scalp.  Last tetanus vaccine was years ago.   History provided by patient and wife.    Past Medical History:  Diagnosis Date   Acute embolic stroke ([place]) 4/0/9811   BPH (benign prostatic hyperplasia)    Bradycardia 11/10/2015   Coronary artery disease    Dizziness 08/21/2012   DYSPEPSIA 07/31/2010   Qualifier: Diagnosis of  By: Grandville Silos RN, Caryl Pina     Hyperlipidemia 05/16/2010   Qualifier: Diagnosis of  By: [name], [name]      Hypertension    HYPERTENSION, BENIGN 05/16/2010   Qualifier: Diagnosis of  By: [name], [name]     Stenosis of right carotid artery 02/01/2017    Past Surgical History:  Procedure Laterality Date   HIP SURGERY     right hip    Intestinal Blockage     LOOP RECORDER INSERTION N/A 05/14/2017   Procedure: Loop Recorder Insertion;  Surgeon: [name], MD;  Location: [facility];  Service: Cardiovascular;  Laterality: N/A;   SKIN CANCER EXCISION     x2   TONSILLECTOMY      MEDICATIONS:  Prior to Admission medications   Medication Sig Start Date End Date Taking? Authorizing Provider  acetaminophen (TYLENOL) 650 MG CR tablet Take 650 mg by mouth every 8 (eight) hours as needed.     [provider]  Ascorbic Acid (VITAMIN C) 1000 MG tablet Take 1,000 mg by mouth daily.    [provider]  aspirin 81 MG tablet Take  1 tablet (81 mg total) by mouth daily. 12/12/18   Minna Merritts, MD  b complex vitamins tablet Take 1 tablet by mouth daily.    [provider]  baclofen (LIORESAL) 20 MG tablet Take 20 mg by mouth 2 (two) times daily. 06/21/22   [provider]  cholecalciferol (VITAMIN D) 1000 units tablet Take 2,000 Units by mouth daily.    [provider]  clindamycin (CLEOCIN) 150 MG capsule Take 1 capsule (150 mg total) by mouth 3 (three) times daily. 09/12/22   Hyatt, Max T, DPM  cyanocobalamin (VITAMIN B12) 1000 MCG tablet Take 1,000 mcg by mouth daily.    [provider]  doxycycline (VIBRA-TABS) 100 MG tablet Take 1 tablet (100 mg total) by mouth 2 (two) times daily. 10/16/22   Felipa Furnace, DPM  ezetimibe (ZETIA) 10 MG tablet Take 1 tablet (10 mg total) by mouth daily. 08/03/22   Minna Merritts, MD  FLAXSEED, LINSEED, PO Take 2 g by mouth daily.    [provider]  lisinopril (ZESTRIL) 20 MG tablet Take by mouth. 08/31/22   [provider]  mupirocin ointment (BACTROBAN) 2 % Apply 1 Application topically 2 (two) times daily. 09/12/22   Hyatt, Max T, DPM  nitroGLYCERIN (NITROSTAT) 0.4 MG SL tablet Place 1 tablet (0.4  mg total) under the tongue every 5 (five) minutes as needed for chest pain. 08/03/22   Minna Merritts, MD  pregabalin (LYRICA) 100 MG capsule TAKE ONE CAPSULE BY MOUTH EVERY MORNING FOR DIABETIC COMPLICATION PERIPHERAL NEUROPATHY 05/23/22   [provider]  pregabalin (LYRICA) 150 MG capsule Take 150 mg in the am & 100 mg in the pm Patient not taking: Reported on 08/03/2022    [provider]  pregabalin (LYRICA) 150 MG capsule TAKE ONE CAPSULE BY MOUTH AT BEDTIME FOR DIABETIC COMPLICATION NOTE: TAKING 100MG  DAILY AND 150MG  AT NIGHT. 05/23/22   [provider]  simvastatin (ZOCOR) 40 MG tablet Take 1 tablet (40 mg total) by mouth every evening. 08/03/22   Minna Merritts, MD  tamsulosin (FLOMAX) 0.4 MG CAPS  capsule Take 2 capsules (0.8 mg total) by mouth daily. 04/21/19   Hollice Espy, MD  zinc sulfate 220 (50 Zn) MG capsule Take 1 tablet by mouth daily. 07/19/22   [provider]    Physical Exam   Triage Vital Signs: ED Triage Vitals [01/06/23 0113]  Enc Vitals Group     BP 123/63     Pulse Rate 73     Resp 14     Temp 97.8 F (36.6 C)     Temp Source Oral     SpO2 100 %     Weight 185 lb (83.9 kg)     Height 5\' 8"  (1.727 m)     Head Circumference      Peak Flow      Pain Score 0     Pain Loc      Pain Edu?      Excl. in [place]?     Most recent vital signs: Vitals:   01/06/23 0113  BP: 123/63  Pulse: 73  Resp: 14  Temp: 97.8 F (36.6 C)  SpO2: 100%     CONSTITUTIONAL: Alert, responds appropriately to questions. Well-appearing; well-nourished; GCS 15 HEAD: Normocephalic; abrasion to the posterior scalp EYES: Conjunctivae clear, PERRL, EOMI ENT: normal nose; no rhinorrhea; moist mucous membranes; pharynx without lesions noted; no dental injury; no septal hematoma, no epistaxis; no facial deformity or bony tenderness NECK: Supple, no midline spinal tenderness, step-off or deformity; trachea midline CARD: RRR; S1 and S2 appreciated; no murmurs, no clicks, no rubs, no gallops RESP: Normal chest excursion without splinting or tachypnea; breath sounds clear and equal bilaterally; no wheezes, no rhonchi, no rales; no hypoxia or respiratory distress CHEST:  chest wall stable, no crepitus or ecchymosis or deformity, nontender to palpation; no flail chest ABD/GI: Non-distended; soft, non-tender, no rebound, no guarding; no ecchymosis or other lesions noted PELVIS:  stable, nontender to palpation BACK:  The back appears normal; no midline spinal tenderness, step-off or deformity EXT: Normal ROM in all joints; no edema; normal capillary refill; no cyanosis, no bony tenderness or bony deformity of patient's extremities, no joint effusions, compartments are soft, extremities  are warm and well-perfused, no ecchymosis SKIN: Normal color for age and race; warm NEURO: No facial asymmetry, normal speech, moving all extremities equally  ED Results / Procedures / Treatments   LABS: (all labs ordered are listed, but only abnormal results are displayed) Labs Reviewed  BASIC METABOLIC PANEL - Abnormal; Notable for the following components:      Result Value   Sodium 132 (*)    CO2 20 (*)    Glucose, Bld 141 (*)    BUN 62 (*)    Calcium 8.4 (*)  All other components within normal limits  CBC - Abnormal; Notable for the following components:   RBC 3.64 (*)    Hemoglobin 11.6 (*)    HCT 33.9 (*)    Platelets 127 (*)    All other components within normal limits  URINALYSIS, ROUTINE W REFLEX MICROSCOPIC - Abnormal; Notable for the following components:   Color, Urine YELLOW (*)    APPearance HAZY (*)    Specific Gravity, Urine 1.033 (*)    Ketones, ur 5 (*)    All other components within normal limits  HEPATIC FUNCTION PANEL - Abnormal; Notable for the following components:   Total Protein 5.9 (*)    Total Bilirubin 1.8 (*)    Bilirubin, Direct 0.4 (*)    Indirect Bilirubin 1.4 (*)    All other components within normal limits  RESP PANEL BY RT-PCR (RSV, FLU A&B, COVID)  RVPGX2  LIPASE, BLOOD  TROPONIN I (HIGH SENSITIVITY)  TROPONIN I (HIGH SENSITIVITY)     EKG:  EKG Interpretation  Date/Time:  Sunday January 06 2023 01:15:23 EST Ventricular Rate:  74 PR Interval:  211 QRS Duration: 101 QT Interval:  411 QTC Calculation: 456 R Axis:   -39 Text Interpretation: Sinus rhythm Abnormal R-wave progression, late transition Inferior infarct, old Baseline wander in lead(s) I III aVL aVF Confirmed by Rochele Raring [phone]) on 01/06/2023 1:22:22 AM          RADIOLOGY: My personal review and interpretation of imaging: CT scan showed no acute abnormality.  Chest x-ray clear.  I have personally reviewed all radiology reports. CT ABDOMEN PELVIS W  CONTRAST  Result Date: 01/06/2023 CLINICAL DATA:  Fall EXAM: CT ABDOMEN AND PELVIS WITH CONTRAST TECHNIQUE: Multidetector CT imaging of the abdomen and pelvis was performed using the standard protocol following bolus administration of intravenous contrast. RADIATION DOSE REDUCTION: This exam was performed according to the departmental dose-optimization program which includes automated exposure control, adjustment of the mA and/or kV according to patient size and/or use of iterative reconstruction technique. CONTRAST:  OMNIPAQUE IOHEXOL 300 MG/ML  SOLN COMPARISON:  None Available. FINDINGS: Lower chest: No acute abnormality.  Coronary artery calcifications. Hepatobiliary: No focal liver abnormality is seen. No gallstones, gallbladder wall thickening, or biliary dilatation. Pancreas: Unremarkable. No pancreatic ductal dilatation or surrounding inflammatory changes. Spleen: No splenic injury or perisplenic hematoma. Adrenals/Urinary Tract: Adrenal glands are unremarkable. Kidneys are normal, without renal calculi, worrisome lesion, or hydronephrosis. Bladder is unremarkable. Stomach/Bowel: Stomach is within normal limits. Appendix not visualized. No evidence of bowel wall thickening, distention, or inflammatory changes. Vascular/Lymphatic: Aortic atherosclerosis. No enlarged abdominal or pelvic lymph nodes. Reproductive: Prostate is unremarkable. Other: No abdominal wall hernia or abnormality. No abdominopelvic ascites. Musculoskeletal: Multilevel degenerative disc disease. No acute abnormality. IMPRESSION: 1. No acute traumatic findings in the abdomen or pelvis. 2. Coronary artery calcifications. Aortic Atherosclerosis (ICD10-I70.0). Electronically Signed   By: Deatra Robinson M.D.   On: 01/06/2023 02:58   CT HEAD WO CONTRAST ( )  Result Date: 01/06/2023 CLINICAL DATA:  Head trauma EXAM: CT HEAD WITHOUT CONTRAST CT CERVICAL SPINE WITHOUT CONTRAST TECHNIQUE: Multidetector CT imaging of the head and cervical  spine was performed following the standard protocol without intravenous contrast. Multiplanar CT image reconstructions of the cervical spine were also generated. RADIATION DOSE REDUCTION: This exam was performed according to the departmental dose-optimization program which includes automated exposure control, adjustment of the mA and/or kV according to patient size and/or use of iterative reconstruction technique. COMPARISON:  None Available. FINDINGS:  CT HEAD FINDINGS Brain: There is no mass, hemorrhage or extra-axial collection. There is generalized atrophy without lobar predilection. Old left parietotemporal infarct. There is periventricular hypoattenuation compatible with chronic microvascular disease. Vascular: Atherosclerotic calcification of the internal carotid arteries at the skull base. No abnormal hyperdensity of the major intracranial arteries or dural venous sinuses. Skull: The visualized skull base, calvarium and extracranial soft tissues are normal. Sinuses/Orbits: No fluid levels or advanced mucosal thickening of the visualized paranasal sinuses. No mastoid or middle ear effusion. The orbits are normal. CT CERVICAL SPINE FINDINGS Alignment: No static subluxation. Facets are aligned. Occipital condyles are normally positioned. Skull base and vertebrae: No acute fracture. Soft tissues and spinal canal: No prevertebral fluid or swelling. No visible canal hematoma. Disc levels: Multilevel degenerative disc disease and facet arthrosis. Foraminal stenosis is greatest at the C5 and C6 neural foramina. Upper chest: No pneumothorax, pulmonary nodule or pleural effusion. Other: Normal visualized paraspinal cervical soft tissues. IMPRESSION: 1. No acute intracranial abnormality. 2. 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 3. No acute fracture or static subluxation of the cervical spine. Electronically Signed   By: Deatra Robinson M.D.   On: 01/06/2023 02:38   CT Cervical Spine Wo  Contrast  Result Date: 01/06/2023 CLINICAL DATA:  Head trauma EXAM: CT HEAD WITHOUT CONTRAST CT CERVICAL SPINE WITHOUT CONTRAST TECHNIQUE: Multidetector CT imaging of the head and cervical spine was performed following the standard protocol without intravenous contrast. Multiplanar CT image reconstructions of the cervical spine were also generated. RADIATION DOSE REDUCTION: This exam was performed according to the departmental dose-optimization program which includes automated exposure control, adjustment of the mA and/or kV according to patient size and/or use of iterative reconstruction technique. COMPARISON:  None Available. FINDINGS: CT HEAD FINDINGS Brain: There is no mass, hemorrhage or extra-axial collection. There is generalized atrophy without lobar predilection. Old left parietotemporal infarct. There is periventricular hypoattenuation compatible with chronic microvascular disease. Vascular: Atherosclerotic calcification of the internal carotid arteries at the skull base. No abnormal hyperdensity of the major intracranial arteries or dural venous sinuses. Skull: The visualized skull base, calvarium and extracranial soft tissues are normal. Sinuses/Orbits: No fluid levels or advanced mucosal thickening of the visualized paranasal sinuses. No mastoid or middle ear effusion. The orbits are normal. CT CERVICAL SPINE FINDINGS Alignment: No static subluxation. Facets are aligned. Occipital condyles are normally positioned. Skull base and vertebrae: No acute fracture. Soft tissues and spinal canal: No prevertebral fluid or swelling. No visible canal hematoma. Disc levels: Multilevel degenerative disc disease and facet arthrosis. Foraminal stenosis is greatest at the C5 and C6 neural foramina. Upper chest: No pneumothorax, pulmonary nodule or pleural effusion. Other: Normal visualized paraspinal cervical soft tissues. IMPRESSION: 1. No acute intracranial abnormality. 2. Old left parietotemporal infarct and  chronic microvascular disease. 3. No acute fracture or static subluxation of the cervical spine. Electronically Signed   By: Deatra Robinson M.D.   On: 01/06/2023 02:38   DG Chest Portable 1 View  Result Date: 01/06/2023 CLINICAL DATA:  Evaluate for pneumonia. Patient is felt sickle day. Nausea and vomiting. Dizziness with subsequent fall. EXAM: PORTABLE CHEST 1 VIEW COMPARISON:  09/13/2012 FINDINGS: A loop recorder is present. Heart size and pulmonary vascularity are normal. Lungs are clear. No pleural effusions. No pneumothorax. Mediastinal contours appear intact. Calcification and torsion of the aorta. IMPRESSION: No active disease. Electronically Signed   By: Burman Nieves M.D.   On: 01/06/2023 01:57     PROCEDURES:  Critical Care performed:  No     .1-3 Lead EKG Interpretation  Performed by: Anel Purohit, Delice Bison, DO Authorized by: Shine Scrogham, Delice Bison, DO     Interpretation: normal     ECG rate:  73   ECG rate assessment: normal     Rhythm: sinus rhythm     Ectopy: none     Conduction: normal       IMPRESSION / MDM / ASSESSMENT AND PLAN / ED COURSE  I reviewed the triage vital signs and the nursing notes.  Patient here with nausea, vomiting and diarrhea x 1 with dizziness causing fall and head injury.  The patient is on the cardiac monitor to evaluate for evidence of arrhythmia and/or significant heart rate changes.   DIFFERENTIAL DIAGNOSIS (includes but not limited to):   Viral gastroenteritis, dehydration, UTI, electrolyte derangement, anemia, ACS, arrhythmia, skull fracture, intracranial hemorrhage  Patient's presentation is most consistent with acute presentation with potential threat to life or bodily function.  PLAN: Will obtain CBC, CMP, lipase, COVID and flu swabs, urinalysis, troponin x 2, chest x-ray, CT of abdomen pelvis.  Will also obtain CT head and cervical spine given fall.  Will give IV fluids, Zofran for symptomatic relief.   MEDICATIONS GIVEN IN ED: Medications   sodium chloride 0.9 % bolus 1,000 mL (0 mLs Intravenous Stopped 01/06/23 0441)  Tdap (BOOSTRIX) injection 0.5 mL (0.5 mLs Intramuscular Given 01/06/23 0154)  ondansetron (ZOFRAN) injection 4 mg (4 mg Intravenous Given 01/06/23 0151)  iohexol (OMNIPAQUE) 300 MG/ML solution 100 mL (100 mLs Intravenous Contrast Given 01/06/23 0220)     ED COURSE: Patient's labs show no leukocytosis.  Normal electrolytes, renal function, LFTs, lipase.  Troponin negative x 2.  Urine shows no sign of infection.  CTs and chest x-ray reviewed and interpreted by myself and the radiologist and show no acute abnormality.  Patient tolerating p.o.  Ambulates without difficulty.  Suspect possible viral gastroenteritis causing his symptoms today.  I feel he is safe for discharge home.  Will provide with prescription of Zofran.   At this time, I do not feel there is any life-threatening condition present. I reviewed all nursing notes, vitals, pertinent previous records.  All lab and urine results, EKGs, imaging ordered have been independently reviewed and interpreted by myself.  I reviewed all available radiology reports from any imaging ordered this visit.  Based on my assessment, I feel the patient is safe to be discharged home without further emergent workup and can continue workup as an outpatient as needed. Discussed all findings, treatment plan as well as usual and customary return precautions.  They verbalize understanding and are comfortable with this plan.  Outpatient follow-up has been provided as needed.  All questions have been answered.    CONSULTS:  none   OUTSIDE RECORDS REVIEWED: Reviewed last [facility] neurology note on 12/19/2022.       FINAL CLINICAL IMPRESSION(S) / ED DIAGNOSES   Final diagnoses:  Nausea vomiting and diarrhea  Fall, initial encounter  Injury of head, initial encounter     Rx / DC Orders   ED Discharge Orders          Ordered    ondansetron (ZOFRAN-ODT) 4 MG disintegrating tablet  Every  6 hours PRN        01/06/23 0515             Note:  This document was prepared using Dragon voice recognition software and may include unintentional dictation errors.   Braedyn Kauk, Delice Bison, DO 01/06/23 [phone]

## 2023-01-08 ENCOUNTER — Emergency Department: Payer: Medicare Other

## 2023-01-08 ENCOUNTER — Other Ambulatory Visit: Payer: Self-pay

## 2023-01-08 ENCOUNTER — Encounter
Admission: EM | Disposition: A | Discharge status: Home Health | Payer: Self-pay | Source: Home / Self Care | Attending: Family Medicine

## 2023-01-08 ENCOUNTER — Inpatient Hospital Stay
Admission: EM | Admit: 2023-01-08 | Discharge: 2023-01-11 | DRG: 378 | Disposition: A | Discharge status: Home Health | Payer: Medicare Other | Attending: Family Medicine | Admitting: Family Medicine

## 2023-01-08 ENCOUNTER — Inpatient Hospital Stay: Payer: Medicare Other | Admitting: Certified Registered"

## 2023-01-08 ENCOUNTER — Encounter: Payer: Self-pay | Admitting: Internal Medicine

## 2023-01-08 DIAGNOSIS — Z7982 Long term (current) use of aspirin: Secondary | ICD-10-CM

## 2023-01-08 DIAGNOSIS — Z885 Allergy status to narcotic agent status: Secondary | ICD-10-CM

## 2023-01-08 DIAGNOSIS — K254 Chronic or unspecified gastric ulcer with hemorrhage: Principal | ICD-10-CM | POA: Diagnosis present

## 2023-01-08 DIAGNOSIS — Z85828 Personal history of other malignant neoplasm of skin: Secondary | ICD-10-CM

## 2023-01-08 DIAGNOSIS — D649 Anemia, unspecified: Secondary | ICD-10-CM

## 2023-01-08 DIAGNOSIS — Z23 Encounter for immunization: Secondary | ICD-10-CM | POA: Diagnosis not present

## 2023-01-08 DIAGNOSIS — E785 Hyperlipidemia, unspecified: Secondary | ICD-10-CM | POA: Diagnosis present

## 2023-01-08 DIAGNOSIS — R001 Bradycardia, unspecified: Secondary | ICD-10-CM | POA: Diagnosis present

## 2023-01-08 DIAGNOSIS — S0001XA Abrasion of scalp, initial encounter: Secondary | ICD-10-CM | POA: Diagnosis present

## 2023-01-08 DIAGNOSIS — D62 Acute posthemorrhagic anemia: Secondary | ICD-10-CM | POA: Diagnosis present

## 2023-01-08 DIAGNOSIS — Z8249 Family history of ischemic heart disease and other diseases of the circulatory system: Secondary | ICD-10-CM | POA: Diagnosis not present

## 2023-01-08 DIAGNOSIS — I25118 Atherosclerotic heart disease of native coronary artery with other forms of angina pectoris: Secondary | ICD-10-CM | POA: Diagnosis not present

## 2023-01-08 DIAGNOSIS — Z888 Allergy status to other drugs, medicaments and biological substances status: Secondary | ICD-10-CM

## 2023-01-08 DIAGNOSIS — K922 Gastrointestinal hemorrhage, unspecified: Secondary | ICD-10-CM

## 2023-01-08 DIAGNOSIS — K449 Diaphragmatic hernia without obstruction or gangrene: Secondary | ICD-10-CM | POA: Diagnosis present

## 2023-01-08 DIAGNOSIS — G250 Essential tremor: Secondary | ICD-10-CM | POA: Diagnosis present

## 2023-01-08 DIAGNOSIS — W19XXXA Unspecified fall, initial encounter: Secondary | ICD-10-CM | POA: Diagnosis present

## 2023-01-08 DIAGNOSIS — Z79899 Other long term (current) drug therapy: Secondary | ICD-10-CM

## 2023-01-08 DIAGNOSIS — I2510 Atherosclerotic heart disease of native coronary artery without angina pectoris: Secondary | ICD-10-CM | POA: Diagnosis present

## 2023-01-08 DIAGNOSIS — I4891 Unspecified atrial fibrillation: Secondary | ICD-10-CM | POA: Diagnosis present

## 2023-01-08 DIAGNOSIS — N400 Enlarged prostate without lower urinary tract symptoms: Secondary | ICD-10-CM | POA: Diagnosis present

## 2023-01-08 DIAGNOSIS — Z8673 Personal history of transient ischemic attack (TIA), and cerebral infarction without residual deficits: Secondary | ICD-10-CM

## 2023-01-08 DIAGNOSIS — K920 Hematemesis: Secondary | ICD-10-CM | POA: Diagnosis not present

## 2023-01-08 DIAGNOSIS — I10 Essential (primary) hypertension: Secondary | ICD-10-CM | POA: Diagnosis present

## 2023-01-08 DIAGNOSIS — Z1152 Encounter for screening for COVID-19: Secondary | ICD-10-CM

## 2023-01-08 DIAGNOSIS — R4182 Altered mental status, unspecified: Principal | ICD-10-CM

## 2023-01-08 DIAGNOSIS — R410 Disorientation, unspecified: Secondary | ICD-10-CM | POA: Diagnosis present

## 2023-01-08 DIAGNOSIS — Z880 Allergy status to penicillin: Secondary | ICD-10-CM

## 2023-01-08 DIAGNOSIS — G9341 Metabolic encephalopathy: Secondary | ICD-10-CM | POA: Insufficient documentation

## 2023-01-08 DIAGNOSIS — I25110 Atherosclerotic heart disease of native coronary artery with unstable angina pectoris: Secondary | ICD-10-CM | POA: Diagnosis present

## 2023-01-08 HISTORY — PX: ESOPHAGOGASTRODUODENOSCOPY (EGD) WITH PROPOFOL: SHX5813

## 2023-01-08 LAB — COMPREHENSIVE METABOLIC PANEL
ALT: 18 U/L (ref 0–44)
AST: 23 U/L (ref 15–41)
Albumin: 3 g/dL — ABNORMAL LOW (ref 3.5–5.0)
Alkaline Phosphatase: 55 U/L (ref 38–126)
Anion gap: 9 (ref 5–15)
BUN: 59 mg/dL — ABNORMAL HIGH (ref 8–23)
CO2: 24 mmol/L (ref 22–32)
Calcium: 8.2 mg/dL — ABNORMAL LOW (ref 8.9–10.3)
Chloride: 102 mmol/L (ref 98–111)
Creatinine, Ser: 1.19 mg/dL (ref 0.61–1.24)
GFR, Estimated: 59 mL/min — ABNORMAL LOW (ref >60)
Glucose, Bld: 166 mg/dL — ABNORMAL HIGH (ref 70–99)
Potassium: 4.1 mmol/L (ref 3.5–5.1)
Sodium: 135 mmol/L (ref 135–145)
Total Bilirubin: 0.9 mg/dL (ref 0.3–1.2)
Total Protein: 4.9 g/dL — ABNORMAL LOW (ref 6.5–8.1)

## 2023-01-08 LAB — CBC WITH DIFFERENTIAL/PLATELET
Abs Immature Granulocytes: 0.04 10*3/uL (ref 0.00–0.07)
Basophils Absolute: 0.1 10*3/uL (ref 0.0–0.1)
Basophils Relative: 1 %
Eosinophils Absolute: 0.3 10*3/uL (ref 0.0–0.5)
Eosinophils Relative: 3 %
HCT: 22.3 % — ABNORMAL LOW (ref 39.0–52.0)
Hemoglobin: 7.4 g/dL — ABNORMAL LOW (ref 13.0–17.0)
Immature Granulocytes: 0 %
Lymphocytes Relative: 37 %
Lymphs Abs: 4.3 10*3/uL — ABNORMAL HIGH (ref 0.7–4.0)
MCH: 31.9 pg (ref 26.0–34.0)
MCHC: 33.2 g/dL (ref 30.0–36.0)
MCV: 96.1 fL (ref 80.0–100.0)
Monocytes Absolute: 0.6 10*3/uL (ref 0.1–1.0)
Monocytes Relative: 5 %
Neutro Abs: 6.2 10*3/uL (ref 1.7–7.7)
Neutrophils Relative %: 54 %
Platelets: 133 10*3/uL — ABNORMAL LOW (ref 150–400)
RBC: 2.32 MIL/uL — ABNORMAL LOW (ref 4.22–5.81)
RDW: 13.9 % (ref 11.5–15.5)
WBC: 11.5 10*3/uL — ABNORMAL HIGH (ref 4.0–10.5)
nRBC: 0 % (ref 0.0–0.2)

## 2023-01-08 LAB — LACTIC ACID, PLASMA
Lactic Acid, Venous: 3.1 mmol/L (ref 0.5–1.9)
Lactic Acid, Venous: 2.8 mmol/L (ref 0.5–1.9)

## 2023-01-08 LAB — URINALYSIS, ROUTINE W REFLEX MICROSCOPIC
Bilirubin Urine: NEGATIVE
Glucose, UA: NEGATIVE mg/dL
Hgb urine dipstick: NEGATIVE
Ketones, ur: 5 mg/dL — AB
Leukocytes,Ua: NEGATIVE
Nitrite: NEGATIVE
Protein, ur: NEGATIVE mg/dL
Specific Gravity, Urine: 1.017 (ref 1.005–1.030)
pH: 5 (ref 5.0–8.0)

## 2023-01-08 LAB — PROCALCITONIN: Procalcitonin: <0.1 ng/mL

## 2023-01-08 LAB — TROPONIN I (HIGH SENSITIVITY)
Troponin I (High Sensitivity): 18 ng/L — ABNORMAL HIGH (ref <18)
Troponin I (High Sensitivity): 20 ng/L — ABNORMAL HIGH (ref <18)

## 2023-01-08 LAB — T4, FREE: Free T4: 0.72 ng/dL (ref 0.61–1.12)

## 2023-01-08 LAB — LIPASE, BLOOD: Lipase: 56 U/L — ABNORMAL HIGH (ref 11–51)

## 2023-01-08 LAB — PROTIME-INR
INR: 1.1 (ref 0.8–1.2)
Prothrombin Time: 14.2 seconds (ref 11.4–15.2)

## 2023-01-08 LAB — ABO/RH: ABO/RH(D): O POS

## 2023-01-08 LAB — PREPARE RBC (CROSSMATCH)

## 2023-01-08 LAB — TSH: TSH: 9.197 u[IU]/mL — ABNORMAL HIGH (ref 0.350–4.500)

## 2023-01-08 LAB — HEMOGLOBIN: Hemoglobin: 8 g/dL — ABNORMAL LOW (ref 13.0–17.0)

## 2023-01-08 SURGERY — ESOPHAGOGASTRODUODENOSCOPY (EGD) WITH PROPOFOL
Anesthesia: General

## 2023-01-08 MED ORDER — LIDOCAINE HCL (CARDIAC) PF 100 MG/5ML IV SOSY
PREFILLED_SYRINGE | INTRAVENOUS | Status: DC | PRN
  Administered 2023-01-08: 80 mg via INTRAVENOUS

## 2023-01-08 MED ORDER — ONDANSETRON HCL 4 MG/2ML IJ SOLN: 4.0000 mg | Freq: Four times a day (QID) | INTRAMUSCULAR | Status: DC | PRN

## 2023-01-08 MED ORDER — ACETAMINOPHEN 325 MG PO TABS
650.0000 mg | ORAL_TABLET | Freq: Four times a day (QID) | ORAL | Status: DC | PRN
  Administered 2023-01-08 – 2023-01-09 (×2): 650 mg via ORAL
  Filled 2023-01-08 (×2): qty 2

## 2023-01-08 MED ORDER — PROPOFOL 10 MG/ML IV BOLUS
INTRAVENOUS | Status: DC | PRN
  Administered 2023-01-08: 30 mg via INTRAVENOUS
  Administered 2023-01-08: 80 mg via INTRAVENOUS

## 2023-01-08 MED ORDER — PANTOPRAZOLE 80MG IVPB - SIMPLE MED
80.0000 mg | Freq: Once | INTRAVENOUS | Status: AC
  Administered 2023-01-08: 80 mg via INTRAVENOUS
  Filled 2023-01-08: qty 100

## 2023-01-08 MED ORDER — SODIUM CHLORIDE 0.9 % IV BOLUS (SEPSIS)
1000.0000 mL | Freq: Once | INTRAVENOUS | Status: AC
  Administered 2023-01-08: 1000 mL via INTRAVENOUS

## 2023-01-08 MED ORDER — PANTOPRAZOLE INFUSION (NEW) - SIMPLE MED
8.0000 mg/h | INTRAVENOUS | Status: DC
  Administered 2023-01-08 – 2023-01-09 (×4): 8 mg/h via INTRAVENOUS
  Filled 2023-01-08 (×4): qty 100

## 2023-01-08 MED ORDER — SODIUM CHLORIDE 0.9 % IV SOLN: INTRAVENOUS | Status: DC

## 2023-01-08 MED ORDER — SODIUM CHLORIDE 0.9 % IV SOLN: 10.0000 mL/h | Freq: Once | INTRAVENOUS | Status: DC

## 2023-01-08 MED ORDER — MORPHINE SULFATE (PF) 2 MG/ML IV SOLN: 2.0000 mg | INTRAVENOUS | Status: DC | PRN

## 2023-01-08 MED ORDER — ACETAMINOPHEN 650 MG RE SUPP: 650.0000 mg | Freq: Four times a day (QID) | RECTAL | Status: DC | PRN

## 2023-01-08 MED ORDER — ONDANSETRON HCL 4 MG PO TABS: 4.0000 mg | ORAL_TABLET | Freq: Four times a day (QID) | ORAL | Status: DC | PRN

## 2023-01-08 NOTE — Assessment & Plan Note (Signed)
History of bradycardia (beta-blocker intolerant) EKG showing A-fib with no prior history of same Continuous cardiac monitoring Had Zio patch in 2023 for syncopal events Not a candidate for anticoagulation in the setting of acute GI bleed Consider cardiology consult for recommendations

## 2023-01-08 NOTE — Assessment & Plan Note (Signed)
Followed by neurology.   

## 2023-01-08 NOTE — Anesthesia Postprocedure Evaluation (Signed)
Anesthesia Post Note  Patient: [name].  Procedure(s) Performed: ESOPHAGOGASTRODUODENOSCOPY (EGD) WITH PROPOFOL  Patient location during evaluation: Endoscopy Anesthesia Type: General Level of consciousness: awake and alert Pain management: pain level controlled Vital Signs Assessment: post-procedure vital signs reviewed and stable Respiratory status: spontaneous breathing, nonlabored ventilation, respiratory function stable and patient connected to nasal cannula oxygen Cardiovascular status: blood pressure returned to baseline and stable Postop Assessment: no apparent nausea or vomiting Anesthetic complications: no   No notable events documented.   Last Vitals:  Vitals:   01/08/23 1921 01/08/23 1931  BP: (!) 107/53   Pulse:    Resp:    Temp:    SpO2:  100%    Last Pain:  Vitals:   01/08/23 1921  TempSrc:   PainSc: 0-No pain                 Arita Miss

## 2023-01-08 NOTE — Assessment & Plan Note (Signed)
Hydralazine IV as needed while n.p.o. 

## 2023-01-08 NOTE — Assessment & Plan Note (Signed)
Likely delirium related to acute illness.  Per wife he is at baseline Fall precautions Delirium precautions

## 2023-01-08 NOTE — Progress Notes (Addendum)
The patient was found on EGD to have a clean-based ulcer in the stomach without any active bleeding.  There is no old blood or new blood seen.  Nothing further to do from a GI point of view since a cause has been found for his melena.  Patient should be continued on a PPI upon discharge for 8 weeks.  I will sign off.  Please call if any further GI concerns or questions.  We would like to thank you for the opportunity to participate in the care of [name]

## 2023-01-08 NOTE — Op Note (Signed)
[facility] Patient Name: [name] Procedure Date: 01/08/2023 6:54 PM MRN: [id number] Account #: [id number] Date of Birth: 02/24/1933 Admit Type: Inpatient Age: 89 Room: [facility] ENDO ROOM 1 Gender: Male Note Status: Finalized Instrument Name: Upper Endoscope [id number] Procedure:             Upper GI endoscopy Indications:           Melena Providers:             [name] MD, MD Referring MD:          Leonie Douglas. Doy Hutching, MD (Referring MD) Medicines:             Propofol per Anesthesia Complications:         No immediate complications. Procedure:             Pre-Anesthesia Assessment:                        - Prior to the procedure, a History and Physical was                         performed, and patient medications and allergies were                         reviewed. The patient's tolerance of previous                         anesthesia was also reviewed. The risks and benefits                         of the procedure and the sedation options and risks                         were discussed with the patient. All questions were                         answered, and informed consent was obtained. Prior                         Anticoagulants: The patient has taken no anticoagulant                         or antiplatelet agents. ASA Grade Assessment: II - A                         patient with mild systemic disease. After reviewing                         the risks and benefits, the patient was deemed in                         satisfactory condition to undergo the procedure.                        After obtaining informed consent, the endoscope was                         passed under direct vision. Throughout the procedure,  the patient's blood pressure, pulse, and oxygen                         saturations were monitored continuously. The Endoscope                         was introduced through the mouth, and advanced to the                          second part of duodenum. The upper GI endoscopy was                         accomplished without difficulty. The patient tolerated                         the procedure well. Findings:      A small hiatal hernia was present.      One non-bleeding cratered gastric ulcer with a clean ulcer base (Forrest       Class III) was found in the gastric antrum.      The examined duodenum was normal. Impression:            - Small hiatal hernia.                        - Non-bleeding gastric ulcer with a clean ulcer base                         (Forrest Class III).                        - Normal examined duodenum.                        - No specimens collected. Recommendation:        - Return patient to hospital ward for ongoing care.                        - Resume regular diet.                        - Continue present medications.                        - Use a proton pump inhibitor PO daily. Procedure Code(s):     --- Professional ---                        [phone], Esophagogastroduodenoscopy, flexible,                         transoral; diagnostic, including collection of                         specimen(s) by brushing or washing, when performed                         (separate procedure) Diagnosis Code(s):     --- Professional ---                        K92.1, Melena (includes  Hematochezia)                        K25.9, Gastric ulcer, unspecified as acute or chronic,                         without hemorrhage or perforation CPT copyright 2022 American [employer] Association. All rights reserved. The codes documented in this report are preliminary and upon coder review may  be revised to meet current compliance requirements. [name] MD, MD 01/08/2023 7:08:30 PM This report has been signed electronically. Number of Addenda: 0 Note Initiated On: 01/08/2023 6:54 PM Estimated Blood Loss:  Estimated blood loss: none.      [facility]

## 2023-01-08 NOTE — Progress Notes (Signed)
Dr Allen Norris spoke with the patient's wife, Pepper Wyndham, regarding Upper Endoscopy scheduled for today due to [place] Emesis.  I and Frederick Peers, RN obtained verbal consent from Mrs. Obryan after her discussion with Dr Allen Norris.  Dr Rosey Bath also spoke with Mrs. Calvo regarding the anesthesia plan and consent signed.  The procedure is scheduled for later today with Dr Allen Norris.

## 2023-01-08 NOTE — ED Provider Notes (Signed)
[facility] Provider Note    Event Date/Time   First MD Initiated Contact with Patient 01/08/23 0010     (approximate)   History   Altered Mental Status   HPI  [name]. is a 89 y.o. male history of previous stroke on aspirin, CAD, hypertension, hyperlipidemia who presents emergency department family with altered mental status that started tonight.  Patient has been confused.  He had a fall but family denies any head injury EMS.  He is unable to provide any history and wife is not yet at bedside.  Afebrile here.  He denies any pain.  Recently seen in the ED for vomiting, diarrhea, fall with head injury with reassuring workup.   History provided by patient, EMS.    Past Medical History:  Diagnosis Date   Acute embolic stroke ([place]) 08/07/1739   BPH (benign prostatic hyperplasia)    Bradycardia 11/10/2015   Coronary artery disease    Dizziness 08/21/2012   DYSPEPSIA 07/31/2010   Qualifier: Diagnosis of  By: Grandville Silos RN, Caryl Pina     Hyperlipidemia 05/16/2010   Qualifier: Diagnosis of  By: [name], [name]      Hypertension    HYPERTENSION, BENIGN 05/16/2010   Qualifier: Diagnosis of  By: [name], [name]     Stenosis of right carotid artery 02/01/2017    Past Surgical History:  Procedure Laterality Date   HIP SURGERY     right hip    Intestinal Blockage     LOOP RECORDER INSERTION N/A 05/14/2017   Procedure: Loop Recorder Insertion;  Surgeon: [name], MD;  Location: [facility];  Service: Cardiovascular;  Laterality: N/A;   SKIN CANCER EXCISION     x2   TONSILLECTOMY      MEDICATIONS:  Prior to Admission medications   Medication Sig Start Date End Date Taking? Authorizing Provider  acetaminophen (TYLENOL) 650 MG CR tablet Take 650 mg by mouth every 8 (eight) hours as needed.     [provider]  Ascorbic Acid (VITAMIN C) 1000 MG tablet Take 1,000 mg by mouth daily.    [provider]  aspirin 81 MG  tablet Take 1 tablet (81 mg total) by mouth daily. 12/12/18   Minna Merritts, MD  b complex vitamins tablet Take 1 tablet by mouth daily.    [provider]  baclofen (LIORESAL) 20 MG tablet Take 20 mg by mouth 2 (two) times daily. 06/21/22   [provider]  cholecalciferol (VITAMIN D) 1000 units tablet Take 2,000 Units by mouth daily.    [provider]  clindamycin (CLEOCIN) 150 MG capsule Take 1 capsule (150 mg total) by mouth 3 (three) times daily. 09/12/22   Hyatt, Max T, DPM  cyanocobalamin (VITAMIN B12) 1000 MCG tablet Take 1,000 mcg by mouth daily.    [provider]  doxycycline (VIBRA-TABS) 100 MG tablet Take 1 tablet (100 mg total) by mouth 2 (two) times daily. 10/16/22   Felipa Furnace, DPM  ezetimibe (ZETIA) 10 MG tablet Take 1 tablet (10 mg total) by mouth daily. 08/03/22   Minna Merritts, MD  FLAXSEED, LINSEED, PO Take 2 g by mouth daily.    [provider]  lisinopril (ZESTRIL) 20 MG tablet Take by mouth. 08/31/22   [provider]  mupirocin ointment (BACTROBAN) 2 % Apply 1 Application topically 2 (two) times daily. 09/12/22   Hyatt, Max T, DPM  nitroGLYCERIN (NITROSTAT) 0.4 MG SL tablet Place 1  tablet (0.4 mg total) under the tongue every 5 (five) minutes as needed for chest pain. 08/03/22   Antonieta Iba, MD  ondansetron (ZOFRAN-ODT) 4 MG disintegrating tablet Take 1 tablet (4 mg total) by mouth every 6 (six) hours as needed for nausea or vomiting. 01/06/23   Keandria Berrocal, Layla Maw, DO  pregabalin (LYRICA) 100 MG capsule TAKE ONE CAPSULE BY MOUTH EVERY MORNING FOR DIABETIC COMPLICATION PERIPHERAL NEUROPATHY 05/23/22   [provider]  pregabalin (LYRICA) 150 MG capsule Take 150 mg in the am & 100 mg in the pm Patient not taking: Reported on 08/03/2022    [provider]  pregabalin (LYRICA) 150 MG capsule TAKE ONE CAPSULE BY MOUTH AT BEDTIME FOR DIABETIC COMPLICATION NOTE: TAKING 100MG  DAILY AND 150MG  AT NIGHT. 05/23/22    [provider]  simvastatin (ZOCOR) 40 MG tablet Take 1 tablet (40 mg total) by mouth every evening. 08/03/22   05/25/22, MD  tamsulosin (FLOMAX) 0.4 MG CAPS capsule Take 2 capsules (0.8 mg total) by mouth daily. 04/21/19   Antonieta Iba, MD  zinc sulfate 220 (50 Zn) MG capsule Take 1 tablet by mouth daily. 07/19/22   [provider]    Physical Exam   Triage Vital Signs: ED Triage Vitals  Enc Vitals Group     BP 01/08/23 0022 (!) 129/55     Pulse Rate 01/08/23 0022 69     Resp 01/08/23 0022 16     Temp 01/08/23 0022 99.2 F (37.3 C)     Temp Source 01/08/23 0022 Rectal     SpO2 01/08/23 0022 100 %     Weight 01/08/23 0024 182 lb 15.7 oz (83 kg)     Height 01/08/23 0024 5\' 8"  (1.727 m)     Head Circumference --      Peak Flow --      Pain Score 01/08/23 0023 0     Pain Loc --      Pain Edu? --      Excl. in GC? --     Most recent vital signs: Vitals:   01/08/23 0022  BP: (!) 129/55  Pulse: 69  Resp: 16  Temp: 99.2 F (37.3 C)  SpO2: 100%    CONSTITUTIONAL: Alert, oriented to person only.  Seems to be confused.  Elderly, pale in appearance HEAD: Normocephalic, atraumatic EYES: Conjunctivae clear, pupils appear equal, sclera nonicteric ENT: normal nose; moist mucous membranes NECK: Supple, normal ROM CARD: RRR; S1 and S2 appreciated RESP: Normal chest excursion without splinting or tachypnea; breath sounds clear and equal bilaterally; no wheezes, no rhonchi, no rales, no hypoxia or respiratory distress, speaking full sentences ABD/GI: Non-distended; soft, non-tender, no rebound, no guarding, no peritoneal signs RECTAL:  Normal rectal tone, patient has melena on exam and is guaiac positive, no gross blood, no hemorrhoids appreciated, nontender rectal exam, no fecal impaction. Chaperone present. BACK: The back appears normal EXT: Normal ROM in all joints; no deformity noted, no edema SKIN: Normal color for age and race; warm; no rash on  exposed skin NEURO: Moves all extremities equally, normal speech, no facial asymmetry, unable to reliably test sensation PSYCH: The patient's mood and manner are appropriate.   ED Results / Procedures / Treatments   LABS: (all labs ordered are listed, but only abnormal results are displayed) Labs Reviewed  CBC WITH DIFFERENTIAL/PLATELET - Abnormal; Notable for the following components:      Result Value   WBC 11.5 (*)    RBC 2.32 (*)  Hemoglobin 7.4 (*)    HCT 22.3 (*)    Platelets 133 (*)    Lymphs Abs 4.3 (*)    All other components within normal limits  COMPREHENSIVE METABOLIC PANEL - Abnormal; Notable for the following components:   Glucose, Bld 166 (*)    BUN 59 (*)    Calcium 8.2 (*)    Total Protein 4.9 (*)    Albumin 3.0 (*)    GFR, Estimated 59 (*)    All other components within normal limits  LIPASE, BLOOD - Abnormal; Notable for the following components:   Lipase 56 (*)    All other components within normal limits  LACTIC ACID, PLASMA - Abnormal; Notable for the following components:   Lactic Acid, Venous 3.1 (*)    All other components within normal limits  TSH - Abnormal; Notable for the following components:   TSH 9.197 (*)    All other components within normal limits  TROPONIN I (HIGH SENSITIVITY) - Abnormal; Notable for the following components:   Troponin I (High Sensitivity) 18 (*)    All other components within normal limits  CULTURE, BLOOD (ROUTINE X 2)  CULTURE, BLOOD (ROUTINE X 2)  PROTIME-INR  PROCALCITONIN  LACTIC ACID, PLASMA  URINALYSIS, ROUTINE W REFLEX MICROSCOPIC  T4, FREE  TYPE AND SCREEN  PREPARE RBC (CROSSMATCH)  ABO/RH  TROPONIN I (HIGH SENSITIVITY)     EKG:  EKG Interpretation  Date/Time:  Tuesday January 08 2023 00:20:53 EST Ventricular Rate:  102 PR Interval:    QRS Duration: 97 QT Interval:  370 QTC Calculation: 482 R Axis:   -27 Text Interpretation: Atrial fibrillation Abnormal R-wave progression, late transition  Inferior infarct, old Confirmed by Dezi Schaner, Cyril Mourning [phone]) on 01/08/2023 12:25:17 AM         RADIOLOGY: My personal review and interpretation of imaging: CT head and cervical spine show no acute abnormality.  Chest x-ray clear.  I have personally reviewed all radiology reports.   DG Chest Portable 1 View  Result Date: 01/08/2023 CLINICAL DATA:  Altered mental EXAM: PORTABLE CHEST 1 VIEW COMPARISON:  01/06/2023 FINDINGS: Heart and mediastinal contours are within normal limits. No focal opacities or effusions. No acute bony abnormality. Loop recorder device projects over the left chest. Aortic atherosclerosis. IMPRESSION: No active disease. Electronically Signed   By: Rolm Baptise M.D.   On: 01/08/2023 02:05   CT Cervical Spine Wo Contrast  Result Date: 01/08/2023 CLINICAL DATA:  Status post fall. EXAM: CT CERVICAL SPINE WITHOUT CONTRAST TECHNIQUE: Multidetector CT imaging of the cervical spine was performed without intravenous contrast. Multiplanar CT image reconstructions were also generated. RADIATION DOSE REDUCTION: This exam was performed according to the departmental dose-optimization program which includes automated exposure control, adjustment of the mA and/or kV according to patient size and/or use of iterative reconstruction technique. COMPARISON:  January 06, 2023 FINDINGS: Alignment: Normal. Skull base and vertebrae: No acute fracture. No primary bone lesion or focal pathologic process. Soft tissues and spinal canal: No prevertebral fluid or swelling. No visible canal hematoma. Disc levels: Marked severity endplate sclerosis is seen throughout the cervical spine, most prominent at the levels of C4-C5, C5-C6, C6-C7 and C7-T1. Moderate severity anterior osteophyte formation and posterior bony spurring are also seen at the levels of C4-C5, C5-C6 and C6-C7. There is marked severity intervertebral disc space narrowing seen at C4-C5, C5-C6, C6-C7 and C7-T1. Bilateral marked severity multilevel facet  joint hypertrophy is noted. Upper chest: Negative. Other: None. IMPRESSION: 1. No acute fracture or subluxation  in the cervical spine. 2. Marked severity multilevel degenerative changes. Electronically Signed   By: Virgina Norfolk M.D.   On: 01/08/2023 01:30   CT HEAD WO CONTRAST (5MM)  Result Date: 01/08/2023 CLINICAL DATA:  Status post fall. EXAM: CT HEAD WITHOUT CONTRAST TECHNIQUE: Contiguous axial images were obtained from the base of the skull through the vertex without intravenous contrast. RADIATION DOSE REDUCTION: This exam was performed according to the departmental dose-optimization program which includes automated exposure control, adjustment of the mA and/or kV according to patient size and/or use of iterative reconstruction technique. COMPARISON:  January 06, 2023 FINDINGS: Brain: There is moderate severity cerebral atrophy with widening of the extra-axial spaces and ventricular dilatation. There are areas of decreased attenuation within the white matter tracts of the supratentorial brain, consistent with microvascular disease changes. A chronic left posterior parietal infarct is seen. Vascular: There is marked severity calcification of the bilateral cavernous carotid arteries. Skull: Normal. Negative for fracture or focal lesion. Sinuses/Orbits: No hyperdense vessel or unexpected calcification. Other: None. IMPRESSION: 1. No acute intracranial abnormality. 2. Chronic left posterior parietal infarct. 3. Cerebral atrophy and microvascular disease changes of the supratentorial brain. Electronically Signed   By: Virgina Norfolk M.D.   On: 01/08/2023 01:28     PROCEDURES:  Critical Care performed: Yes, see critical care procedure note(s)   CRITICAL CARE Performed by: Cyril Mourning Sesar Madewell   Total critical care time: 40 minutes  Critical care time was exclusive of separately billable procedures and treating other patients.  Critical care was necessary to treat or prevent imminent or  life-threatening deterioration.  Critical care was time spent personally by me on the following activities: development of treatment plan with patient and/or surrogate as well as nursing, discussions with consultants, evaluation of patient's response to treatment, examination of patient, obtaining history from patient or surrogate, ordering and performing treatments and interventions, ordering and review of laboratory studies, ordering and review of radiographic studies, pulse oximetry and re-evaluation of patient's condition.   Marland Kitchen1-3 Lead EKG Interpretation  Performed by: Dominiqua Cooner, Delice Bison, DO Authorized by: Torrin Frein, Delice Bison, DO     Interpretation: normal     ECG rate:  69   ECG rate assessment: normal     Rhythm: sinus rhythm     Ectopy: none     Conduction: normal       IMPRESSION / MDM / ASSESSMENT AND PLAN / ED COURSE  I reviewed the triage vital signs and the nursing notes.    Patient here with altered mental status, upper GI bleed.  The patient is on the cardiac monitor to evaluate for evidence of arrhythmia and/or significant heart rate changes.   DIFFERENTIAL DIAGNOSIS (includes but not limited to):   Stroke, intracranial hemorrhage, anemia, GI bleed, electrolyte derangement, thyroid dysfunction, UTI   Patient's presentation is most consistent with acute presentation with potential threat to life or bodily function.   PLAN: Will obtain CT head and cervical spine given no family at bedside currently to confirm whether or not he did hit his head or if this fall was witnessed.  Will also obtain CBC, CMP, lipase, troponin x 2, TSH, procalcitonin, lactic, urinalysis, type and screen, INR.  Will start Protonix bolus and infusion.   MEDICATIONS GIVEN IN ED: Medications  pantoprozole (PROTONIX) 80 mg /NS 100 mL infusion (8 mg/hr Intravenous New Bag/Given 01/08/23 0132)  0.9 %  sodium chloride infusion (0 mL/hr Intravenous Hold 01/08/23 0133)  pantoprazole (PROTONIX) 80 mg /NS 100  mL IVPB (  0 mg Intravenous Stopped 01/08/23 0130)  sodium chloride 0.9 % bolus 1,000 mL (1,000 mLs Intravenous New Bag/Given 01/08/23 0114)     ED COURSE: Patient's hemoglobin has dropped to 7.4 from 11.62 days ago.  Consistent with melena seen on exam.  Given history of CAD, will transfuse to get hemoglobin greater than 8.  No significant electrolyte derangement.  Normal platelets and INR.  First troponin 18.  Second pending.  TSH elevated.  Will add on free T4.  Lactic elevated at 3.1 but low suspicion that this is from sepsis.  Will give IV fluids.  No indication for antibiotics as he has no other signs of infection today.  Chest x-ray reviewed and interpreted by myself and radiologist and is clear.  CT head and cervical spine show no traumatic injury.  Will discuss with hospitalist for admission.   CONSULTS:  Consulted and discussed patient's case with hospitalist, Dr. Para March.  I have recommended admission and consulting physician agrees and will place admission orders.  Patient (and family if present) agree with this plan.   I reviewed all nursing notes, vitals, pertinent previous records.  All labs, EKGs, imaging ordered have been independently reviewed and interpreted by myself.    OUTSIDE RECORDS REVIEWED:  Reviewed last neurology visit on 12/19/2022.       FINAL CLINICAL IMPRESSION(S) / ED DIAGNOSES   Final diagnoses:  Altered mental status, unspecified altered mental status type  Upper GI bleed  Anemia, unspecified type     Rx / DC Orders   ED Discharge Orders     None        Note:  This document was prepared using Dragon voice recognition software and may include unintentional dictation errors.   Mcclellan Demarais, Layla Maw, DO 01/08/23 [phone]

## 2023-01-08 NOTE — Assessment & Plan Note (Signed)
Hold aspirin, simvastatin, lisinopril, Zetia

## 2023-01-08 NOTE — ED Triage Notes (Signed)
Patient arrived via EMS for fall, denies LOC, denies hitting head. Patient disoriented at scene and did not recognize wife. CBG 150. Afib at 80's, initial SBP in 90's, 120/80 en route. 98-99% on RA. Patient arrived to ED alert with spontaneous regular respirations on room air. Patient pale and continues to be disoriented and oriented to name and place only. Cooperative with care. Moves all extremities, no facial droop noted.

## 2023-01-08 NOTE — ED Notes (Signed)
Patient incontinent of urine in bed. Pericare performed. Linens changed. Dry brief applied. No distress noted at this time.

## 2023-01-08 NOTE — Assessment & Plan Note (Signed)
On aspirin and simvastatin. Holding aspirin for concern of GI bleed

## 2023-01-08 NOTE — Transfer of Care (Signed)
Immediate Anesthesia Transfer of Care Note  Patient: [name].  Procedure(s) Performed: ESOPHAGOGASTRODUODENOSCOPY (EGD) WITH PROPOFOL  Patient Location: Endoscopy Unit  Anesthesia Type:General  Level of Consciousness: drowsy  Airway & Oxygen Therapy: Patient Spontanous Breathing and Patient connected to nasal cannula oxygen  Post-op Assessment: Report given to RN, Post -op Vital signs reviewed and stable, and Patient moving all extremities  Post vital signs: Reviewed and stable  Last Vitals:  Vitals Value Taken Time  BP 105/57 01/08/23 1911  Temp    Pulse 93 01/08/23 1911  Resp 22 01/08/23 1911  SpO2 97 % 01/08/23 1911  Vitals shown include unvalidated device data.  Last Pain:  Vitals:   01/08/23 1540  TempSrc: Oral  PainSc:          Complications: No notable events documented.

## 2023-01-08 NOTE — H&P (Addendum)
History and Physical    Patient: [name]. ZOX:[id number] DOB: February 24, 1933 DOA: 01/08/2023 DOS: the patient was seen and examined on 01/08/2023 PCP: [name], MD  Patient coming from: Home  Chief Complaint:  Chief Complaint  Patient presents with   Altered Mental Status    HPI: [name]. is a 89 y.o. male with medical history significant for CAD, CVA 2018, HTN, HLD, essential tremor followed by neurology, bradycardia (beta-blocker intolerant), who presents to the ED for the second time in 3 days with vomiting and diarrhea.  On his first visit to the ED on 01/06/2023 patient, patient presented for fall in the setting of a single episode of vomiting and diarrhea earlier in the day.  CT head was negative and CT abdomen and pelvis was nonacute.  Return visit to the ED because of confusion that started the same evening as well as pallor.  Family also stated that the emesis a couple days prior was coffee-ground. ED course and data review: Low-grade temp of 99.2 with otherwise normal vitals.  Hemoglobin 7.4 down from 11.6 on 2/4.  BUN/creatinine 59/1.19..  Lipase slightly elevated at 56, LFTs normal.  Lactic acid 2.8.  EKG showing A-fib at 102.  Chest x-ray nonacute Patient was given an IV fluid bolus started on a Protonix infusion as well as 1 unit PRBC.  Hospitalist consulted for admission.     Past Medical History:  Diagnosis Date   Acute embolic stroke (HCC) 02/01/2017   BPH (benign prostatic hyperplasia)    Bradycardia 11/10/2015   Coronary artery disease    Dizziness 08/21/2012   DYSPEPSIA 07/31/2010   Qualifier: Diagnosis of  By: Janee Morn RN, Morrie Sheldon     Hyperlipidemia 05/16/2010   Qualifier: Diagnosis of  By: Mariah Milling MD, [name]      Hypertension    HYPERTENSION, BENIGN 05/16/2010   Qualifier: Diagnosis of  By: Mariah Milling MD, [name]     Stenosis of right carotid artery 02/01/2017   Past Surgical History:  Procedure Laterality Date   HIP SURGERY     right hip     Intestinal Blockage     LOOP RECORDER INSERTION N/A 05/14/2017   Procedure: Loop Recorder Insertion;  Surgeon: Duke Salvia, MD;  Location: [facility] INVASIVE CV [facility];  Service: Cardiovascular;  Laterality: N/A;   SKIN CANCER EXCISION     x2   TONSILLECTOMY     Social History:  reports that he has never smoked. He has never used smokeless tobacco. He reports current alcohol use. He reports that he does not use drugs.  Allergies  Allergen Reactions   Penicillins Rash    Rash    Rash    Gabapentin    Penicillin G    Codeine Nausea Only and Nausea And Vomiting    Other reaction(s): Vomiting Other reaction(s): Vomiting    Family History  Problem Relation Age of Onset   Hypertension Mother    Cancer Other    Hyperlipidemia Other    Hypertension Other    Other Other        CHF    Prior to Admission medications   Medication Sig Start Date End Date Taking? Authorizing Provider  acetaminophen (TYLENOL) 650 MG CR tablet Take 650 mg by mouth every 8 (eight) hours as needed.     [provider]  Ascorbic Acid (VITAMIN C) 1000 MG tablet Take 1,000 mg by mouth daily.    [provider]  aspirin 81 MG tablet Take 1  tablet (81 mg total) by mouth daily. 12/12/18   Minna Merritts, MD  b complex vitamins tablet Take 1 tablet by mouth daily.    [provider]  baclofen (LIORESAL) 20 MG tablet Take 20 mg by mouth 2 (two) times daily. 06/21/22   [provider]  cholecalciferol (VITAMIN D) 1000 units tablet Take 2,000 Units by mouth daily.    [provider]  clindamycin (CLEOCIN) 150 MG capsule Take 1 capsule (150 mg total) by mouth 3 (three) times daily. 09/12/22   Hyatt, Max T, DPM  cyanocobalamin (VITAMIN B12) 1000 MCG tablet Take 1,000 mcg by mouth daily.    [provider]  doxycycline (VIBRA-TABS) 100 MG tablet Take 1 tablet (100 mg total) by mouth 2 (two) times daily. 10/16/22   Felipa Furnace, DPM  ezetimibe (ZETIA) 10 MG tablet  Take 1 tablet (10 mg total) by mouth daily. 08/03/22   Minna Merritts, MD  FLAXSEED, LINSEED, PO Take 2 g by mouth daily.    [provider]  lisinopril (ZESTRIL) 20 MG tablet Take by mouth. 08/31/22   [provider]  mupirocin ointment (BACTROBAN) 2 % Apply 1 Application topically 2 (two) times daily. 09/12/22   Hyatt, Max T, DPM  nitroGLYCERIN (NITROSTAT) 0.4 MG SL tablet Place 1 tablet (0.4 mg total) under the tongue every 5 (five) minutes as needed for chest pain. 08/03/22   Minna Merritts, MD  ondansetron (ZOFRAN-ODT) 4 MG disintegrating tablet Take 1 tablet (4 mg total) by mouth every 6 (six) hours as needed for nausea or vomiting. 01/06/23   Ward, Delice Bison, DO  pregabalin (LYRICA) 100 MG capsule TAKE ONE CAPSULE BY MOUTH EVERY MORNING FOR DIABETIC COMPLICATION PERIPHERAL NEUROPATHY 05/23/22   [provider]  pregabalin (LYRICA) 150 MG capsule Take 150 mg in the am & 100 mg in the pm Patient not taking: Reported on 08/03/2022    [provider]  pregabalin (LYRICA) 150 MG capsule TAKE ONE CAPSULE BY MOUTH AT BEDTIME FOR DIABETIC COMPLICATION NOTE: TAKING 100MG  DAILY AND 150MG  AT NIGHT. 05/23/22   [provider]  simvastatin (ZOCOR) 40 MG tablet Take 1 tablet (40 mg total) by mouth every evening. 08/03/22   Minna Merritts, MD  tamsulosin (FLOMAX) 0.4 MG CAPS capsule Take 2 capsules (0.8 mg total) by mouth daily. 04/21/19   Hollice Espy, MD  zinc sulfate 220 (50 Zn) MG capsule Take 1 tablet by mouth daily. 07/19/22   [provider]    Physical Exam: Vitals:   01/08/23 0024 01/08/23 0300 01/08/23 0309 01/08/23 0324  BP:  128/65 128/65 138/75  Pulse:  64 80 94  Resp:  12 20 (!) 26  Temp:   98.7 F (37.1 C) 98.4 F (36.9 C)  TempSrc:   Oral Oral  SpO2:  100%  100%  Weight: 83 kg     Height: 5\' 8"  (1.727 m)      Physical Exam Vitals and nursing note reviewed.  Constitutional:      General: He is not in acute distress. HENT:      Head: Normocephalic and atraumatic.  Cardiovascular:     Rate and Rhythm: Normal rate and regular rhythm.     Heart sounds: Normal heart sounds.  Pulmonary:     Effort: Pulmonary effort is normal.     Breath sounds: Normal breath sounds.  Abdominal:     Palpations: Abdomen is soft.     Tenderness: There is no abdominal tenderness.  Neurological:     General: No focal deficit present.     Mental Status: He is disoriented.    Labs on Admission: I have personally reviewed following labs and imaging studies  CBC: Recent Labs  Lab 01/06/23 0118 01/08/23 0027  WBC 10.4 11.5*  NEUTROABS  --  6.2  HGB 11.6* 7.4*  HCT 33.9* 22.3*  MCV 93.1 96.1  PLT 127* 209*   Basic Metabolic Panel: Recent Labs  Lab 01/06/23 0118 01/08/23 0027  NA 132* 135  K 4.3 4.1  CL 102 102  CO2 20* 24  GLUCOSE 141* 166*  BUN 62* 59*  CREATININE 1.11 1.19  CALCIUM 8.4* 8.2*   GFR: Estimated Creatinine Clearance: 45.9 mL/min (by C-G formula based on SCr of 1.19 mg/dL). Liver Function Tests: Recent Labs  Lab 01/06/23 0118 01/08/23 0027  AST 22 23  ALT 23 18  ALKPHOS 66 55  BILITOT 1.8* 0.9  PROT 5.9* 4.9*  ALBUMIN 3.7 3.0*   Recent Labs  Lab 01/06/23 0118 01/08/23 0027  LIPASE 41 56*   No results for input(s): "AMMONIA" in the last 168 hours. Coagulation Profile: Recent Labs  Lab 01/08/23 0027  INR 1.1   Cardiac Enzymes: No results for input(s): "CKTOTAL", "CKMB", "CKMBINDEX", "TROPONINI" in the last 168 hours. BNP (last 3 results) No results for input(s): "PROBNP" in the last 8760 hours. HbA1C: No results for input(s): "HGBA1C" in the last 72 hours. CBG: No results for input(s): "GLUCAP" in the last 168 hours. Lipid Profile: No results for input(s): "CHOL", "HDL", "LDLCALC", "TRIG", "CHOLHDL", "LDLDIRECT" in the last 72 hours. Thyroid Function Tests: Recent Labs    01/08/23 0027  TSH 9.197*  FREET4 0.72   Anemia Panel: No results for input(s): "VITAMINB12", "FOLATE",  "FERRITIN", "TIBC", "IRON", "RETICCTPCT" in the last 72 hours. Urine analysis:    Component Value Date/Time   COLORURINE YELLOW (A) 01/08/2023 0027   APPEARANCEUR CLEAR (A) 01/08/2023 0027   APPEARANCEUR Clear 05/29/2018 1049   LABSPEC 1.017 01/08/2023 0027   LABSPEC 1.011 09/13/2012 1609   PHURINE 5.0 01/08/2023 0027   GLUCOSEU NEGATIVE 01/08/2023 0027   GLUCOSEU Negative 09/13/2012 1609   HGBUR NEGATIVE 01/08/2023 0027   BILIRUBINUR NEGATIVE 01/08/2023 0027   BILIRUBINUR Negative 05/29/2018 1049   BILIRUBINUR Negative 09/13/2012 1609   KETONESUR 5 (A) 01/08/2023 0027   PROTEINUR NEGATIVE 01/08/2023 0027   NITRITE NEGATIVE 01/08/2023 0027   LEUKOCYTESUR NEGATIVE 01/08/2023 0027   LEUKOCYTESUR Negative 09/13/2012 1609    Radiological Exams on Admission: DG Chest Portable 1 View  Result Date: 01/08/2023 CLINICAL DATA:  Altered mental EXAM: PORTABLE CHEST 1 VIEW COMPARISON:  01/06/2023 FINDINGS: Heart and mediastinal contours are within normal limits. No focal opacities or effusions. No acute bony abnormality. Loop recorder device projects over the left chest. Aortic atherosclerosis. IMPRESSION: No active disease. Electronically Signed   By: Rolm Baptise M.D.   On: 01/08/2023 02:05   CT Cervical Spine Wo Contrast  Result Date: 01/08/2023 CLINICAL DATA:  Status post fall. EXAM: CT CERVICAL SPINE WITHOUT CONTRAST TECHNIQUE: Multidetector CT imaging of the cervical spine was performed without intravenous contrast. Multiplanar CT image reconstructions were also generated. RADIATION DOSE REDUCTION: This exam was performed according to the departmental dose-optimization program which includes automated exposure control, adjustment of the mA and/or kV according to patient size and/or use of iterative reconstruction technique. COMPARISON:  January 06, 2023 FINDINGS: Alignment: Normal. Skull base and vertebrae: No acute fracture. No primary bone lesion or focal pathologic process. Soft tissues  and  spinal canal: No prevertebral fluid or swelling. No visible canal hematoma. Disc levels: Marked severity endplate sclerosis is seen throughout the cervical spine, most prominent at the levels of C4-C5, C5-C6, C6-C7 and C7-T1. Moderate severity anterior osteophyte formation and posterior bony spurring are also seen at the levels of C4-C5, C5-C6 and C6-C7. There is marked severity intervertebral disc space narrowing seen at C4-C5, C5-C6, C6-C7 and C7-T1. Bilateral marked severity multilevel facet joint hypertrophy is noted. Upper chest: Negative. Other: None. IMPRESSION: 1. No acute fracture or subluxation in the cervical spine. 2. Marked severity multilevel degenerative changes. Electronically Signed   By: Aram Candela M.D.   On: 01/08/2023 01:30   CT HEAD WO CONTRAST ( )  Result Date: 01/08/2023 CLINICAL DATA:  Status post fall. EXAM: CT HEAD WITHOUT CONTRAST TECHNIQUE: Contiguous axial images were obtained from the base of the skull through the vertex without intravenous contrast. RADIATION DOSE REDUCTION: This exam was performed according to the departmental dose-optimization program which includes automated exposure control, adjustment of the mA and/or kV according to patient size and/or use of iterative reconstruction technique. COMPARISON:  January 06, 2023 FINDINGS: Brain: There is moderate severity cerebral atrophy with widening of the extra-axial spaces and ventricular dilatation. There are areas of decreased attenuation within the white matter tracts of the supratentorial brain, consistent with microvascular disease changes. A chronic left posterior parietal infarct is seen. Vascular: There is marked severity calcification of the bilateral cavernous carotid arteries. Skull: Normal. Negative for fracture or focal lesion. Sinuses/Orbits: No hyperdense vessel or unexpected calcification. Other: None. IMPRESSION: 1. No acute intracranial abnormality. 2. Chronic left posterior parietal infarct. 3.  Cerebral atrophy and microvascular disease changes of the supratentorial brain. Electronically Signed   By: Aram Candela M.D.   On: 01/08/2023 01:28     Data Reviewed: Relevant notes from primary care and specialist visits, past discharge summaries as available in EHR, including Care Everywhere. Prior diagnostic testing as pertinent to current admission diagnoses Updated medications and problem lists for reconciliation ED course, including vitals, labs, imaging, treatment and response to treatment Triage notes, nursing and pharmacy notes and ED provider's notes Notable results as noted in HPI   Assessment and Plan: * ABLA (acute blood loss anemia) Coffee-ground emesis Hemoglobin 7.4 down from 11.6 on 2/4 with reports of coffee-ground emesis in the past couple days Being transfused 1 unit PRBCs in the ED Continue to monitor H&H and transfuse if necessary N.p.o. Continue Protonix infusion GI consult for EGD  Acute confusion Likely delirium related to acute illness Fall precautions Delirium precautions  Atrial fibrillation, new onset (HCC) History of bradycardia (beta-blocker intolerant) EKG showing A-fib with no prior history of same Continuous cardiac monitoring Had Zio patch in 2023 for syncopal events Not a candidate for anticoagulation in the setting of acute GI bleed Consider cardiology consult for recommendations  History of CVA (cerebrovascular accident) On aspirin and simvastatin  Essential tremor Followed by neurology  CAD, NATIVE VESSEL Hold aspirin, simvastatin, lisinopril, Zetia  HYPERTENSION, BENIGN Hydralazine IV as needed while n.p.o.        DVT prophylaxis: SCDs  Consults: GI Dr. Tobi Bastos  Advance Care Planning: full code  Family Communication: none  Disposition Plan: Back to previous home environment  Severity of Illness: The appropriate patient status for this patient is INPATIENT. Inpatient status is judged to be reasonable and  necessary in order to provide the required intensity of service to ensure the patient's safety. The patient's presenting symptoms, physical exam findings,  and initial radiographic and laboratory data in the context of their chronic comorbidities is felt to place them at high risk for further clinical deterioration. Furthermore, it is not anticipated that the patient will be medically stable for discharge from the hospital within 2 midnights of admission.   * I certify that at the point of admission it is my clinical judgment that the patient will require inpatient hospital care spanning beyond 2 midnights from the point of admission due to high intensity of service, high risk for further deterioration and high frequency of surveillance required.*  Author: Athena Masse, MD 01/08/2023 4:29 AM  For on call review [email or web address].

## 2023-01-08 NOTE — Anesthesia Preprocedure Evaluation (Signed)
Anesthesia Evaluation  Patient identified by MRN, date of birth, ID band Patient confused    Reviewed: Allergy & Precautions, NPO status , Patient's Chart, lab work & pertinent test results  History of Anesthesia Complications Negative for: history of anesthetic complications  Airway Mallampati: III  TM Distance: >3 FB Neck ROM: Full    Dental  (+) Chipped   Pulmonary neg pulmonary ROS, neg sleep apnea, neg COPD, Patient abstained from smoking.Not current smoker   Pulmonary exam normal breath sounds clear to auscultation       Cardiovascular Exercise Tolerance: Poor METShypertension, + CAD  (-) Past MI (-) dysrhythmias  Rhythm:Regular Rate:Normal - Systolic murmurs TTE 4315:  1. Left ventricular ejection fraction, by estimation, is 55%. The left  ventricle has normal function. The left ventricle has no regional wall  motion abnormalities. Left ventricular diastolic parameters are consistent  with Grade I diastolic dysfunction  (impaired relaxation).   2. Right ventricular systolic function is normal. The right ventricular  size is normal. There is normal pulmonary artery systolic pressure.   3. Mild mitral valve regurgitation.   4. The aortic valve is normal in structure. Aortic valve regurgitation is  mild. Mild to moderate aortic valve sclerosis/calcification is present,  with borderline mild aortic stenosis. Aortic valve mean gradient measures  10.0 mmHg.     Neuro/Psych  PSYCHIATRIC DISORDERS     Dementia  Neuromuscular disease CVA    GI/Hepatic ,neg GERD  ,,(+)     (-) substance abuse    Endo/Other  neg diabetes    Renal/GU negative Renal ROS     Musculoskeletal   Abdominal   Peds  Hematology  (+) Blood dyscrasia, anemia   Anesthesia Other Findings Past Medical History: 4/0/0867: Acute embolic stroke (HCC) No date: BPH (benign prostatic hyperplasia) 11/10/2015: Bradycardia No date: Coronary artery  disease 08/21/2012: Dizziness 07/31/2010: DYSPEPSIA     Comment:  Qualifier: Diagnosis of  By: Grandville Silos, RN, Caryl Pina   05/16/2010: Hyperlipidemia     Comment:  Qualifier: Diagnosis of  By: [name], [name]    No date: Hypertension 05/16/2010: HYPERTENSION, BENIGN     Comment:  Qualifier: Diagnosis of  ByRockey Situ MD, [name]   02/01/2017: Stenosis of right carotid artery  Reproductive/Obstetrics                              Anesthesia Physical Anesthesia Plan  ASA: 3  Anesthesia Plan: General   Post-op Pain Management: Minimal or no pain anticipated   Induction: Intravenous  PONV Risk Score and Plan: 2 and Propofol infusion, TIVA and Ondansetron  Airway Management Planned: Nasal Cannula  Additional Equipment: None  Intra-op Plan:   Post-operative Plan:   Informed Consent: I have reviewed the patients History and Physical, chart, labs and discussed the procedure including the risks, benefits and alternatives for the proposed anesthesia with the patient or authorized representative who has indicated his/her understanding and acceptance.     Dental advisory given and Consent reviewed with POA  Plan Discussed with: CRNA and Surgeon  Anesthesia Plan Comments: (Patient denies any nausea or vomiting.  Discussed risks of anesthesia with patient's wife, including possibility of difficulty with spontaneous ventilation under anesthesia necessitating airway intervention, PONV, and rare risks such as cardiac or respiratory or neurological events, and allergic reactions. Discussed the role of CRNA in patient's perioperative care. She  understands.)         Anesthesia Quick Evaluation

## 2023-01-08 NOTE — Consult Note (Signed)
Calvin Lame, MD [facility]  [address], [place], [place] [address] Phone: [phone] Fax : [phone]  Consultation  Referring Provider:     Dr. Damita Dunnings Primary Care Physician:  Calvin Crouch, MD Primary Gastroenterologist: [facility]         Reason for Consultation:     GI bleeding  Date of Admission:  01/08/2023 Date of Consultation:  01/08/2023         HPI:   Calvin Joynt. is a 89 y.o. male with a history of stroke and memory loss with confusion who was admitted with some altered mental status and having a fall.  The patient had been in the ED recently for vomiting diarrhea with a fall.  Patient is not able to give much of a history but has a hemoglobin of 11.6 two days ago and was admitted last night with a hemoglobin of 7.4. There was a report of some coffee-ground emesis in the last couple of days.  The patient denies any abdominal pain nausea vomiting fevers or chills at the present time. Spoke to the patient's wife who states that his baseline is confusion and inability to give much history since her stroke.  Past Medical History:  Diagnosis Date   Acute embolic stroke ([place]) 07/07/4626   BPH (benign prostatic hyperplasia)    Bradycardia 11/10/2015   Coronary artery disease    Dizziness 08/21/2012   DYSPEPSIA 07/31/2010   Qualifier: Diagnosis of  By: Grandville Silos RN, Caryl Pina     Hyperlipidemia 05/16/2010   Qualifier: Diagnosis of  By: [name], [name]      Hypertension    HYPERTENSION, BENIGN 05/16/2010   Qualifier: Diagnosis of  By: [name], [name]     Stenosis of right carotid artery 02/01/2017    Past Surgical History:  Procedure Laterality Date   HIP SURGERY     right hip    Intestinal Blockage     LOOP RECORDER INSERTION N/A 05/14/2017   Procedure: Loop Recorder Insertion;  Surgeon: [name], MD;  Location: [facility];  Service: Cardiovascular;  Laterality: N/A;   SKIN CANCER EXCISION     x2   TONSILLECTOMY      Prior to  Admission medications   Medication Sig Start Date End Date Taking? Authorizing Provider  nitroGLYCERIN (NITROSTAT) 0.4 MG SL tablet Place 1 tablet (0.4 mg total) under the tongue every 5 (five) minutes as needed for chest pain. 08/03/22  Yes Minna Merritts, MD  acetaminophen (TYLENOL) 650 MG CR tablet Take 650 mg by mouth every 8 (eight) hours as needed.     [provider]  Ascorbic Acid (VITAMIN C) 1000 MG tablet Take 1,000 mg by mouth daily.    [provider]  aspirin 81 MG tablet Take 1 tablet (81 mg total) by mouth daily. 12/12/18   Minna Merritts, MD  b complex vitamins tablet Take 1 tablet by mouth daily.    [provider]  baclofen (LIORESAL) 20 MG tablet Take 20 mg by mouth 2 (two) times daily. 06/21/22   [provider]  cholecalciferol (VITAMIN D) 1000 units tablet Take 2,000 Units by mouth daily.    [provider]  clindamycin (CLEOCIN) 150 MG capsule Take 1 capsule (150 mg total) by mouth 3 (three) times daily. Patient not taking: Reported on 01/08/2023 09/12/22   Tyson Dense T, DPM  cyanocobalamin (VITAMIN B12) 1000 MCG tablet Take 1,000 mcg by mouth daily.  [provider]  doxycycline (VIBRA-TABS) 100 MG tablet Take 1 tablet (100 mg total) by mouth 2 (two) times daily. Patient not taking: Reported on 01/08/2023 10/16/22   Candelaria Stagers, DPM  ezetimibe (ZETIA) 10 MG tablet Take 1 tablet (10 mg total) by mouth daily. 08/03/22   Antonieta Iba, MD  FLAXSEED, LINSEED, PO Take 2 g by mouth daily.    [provider]  lisinopril (ZESTRIL) 10 MG tablet Take 10 mg by mouth daily. 08/31/22   [provider]  mupirocin ointment (BACTROBAN) 2 % Apply 1 Application topically 2 (two) times daily. 09/12/22   Hyatt, Max T, DPM  ondansetron (ZOFRAN-ODT) 4 MG disintegrating tablet Take 1 tablet (4 mg total) by mouth every 6 (six) hours as needed for nausea or vomiting. 01/06/23   Ward, Layla Maw, DO  pregabalin (LYRICA) 100 MG  capsule TAKE ONE CAPSULE BY MOUTH EVERY MORNING FOR DIABETIC COMPLICATION PERIPHERAL NEUROPATHY 05/23/22   [provider]  pregabalin (LYRICA) 150 MG capsule Take 150 mg in the am & 100 mg in the pm Patient not taking: Reported on 08/03/2022    [provider]  pregabalin (LYRICA) 150 MG capsule TAKE ONE CAPSULE BY MOUTH AT BEDTIME FOR DIABETIC COMPLICATION NOTE: TAKING 100MG  DAILY AND 150MG  AT NIGHT. 05/23/22   [provider]  simvastatin (ZOCOR) 40 MG tablet Take 1 tablet (40 mg total) by mouth every evening. 08/03/22   05/25/22, MD  tamsulosin (FLOMAX) 0.4 MG CAPS capsule Take 2 capsules (0.8 mg total) by mouth daily. 04/21/19   Antonieta Iba, MD  zinc sulfate 220 (50 Zn) MG capsule Take 1 tablet by mouth daily. 07/19/22   [provider]    Family History  Problem Relation Age of Onset   Hypertension Mother    Cancer Other    Hyperlipidemia Other    Hypertension Other    Other Other        CHF     Social History   Tobacco Use   Smoking status: Never   Smokeless tobacco: Never  Vaping Use   Vaping Use: Never used  Substance Use Topics   Alcohol use: Yes    Comment: occassionally   Drug use: No    Allergies as of 01/08/2023 - Review Complete 01/08/2023  Allergen Reaction Noted   Penicillins Rash 08/20/2008   Gabapentin  01/31/2017   Penicillin g  08/20/2008   Codeine Nausea Only and Nausea And Vomiting 08/22/2015    Review of Systems:    All systems reviewed and negative except where noted in HPI.   Physical Exam:  Vital signs in last 24 hours: Temp:  [98 F (36.7 C)-99.2 F (37.3 C)] 98.3 F (36.8 C) (02/06 0630) Pulse Rate:  [48-94] 82 (02/06 0630) Resp:  [12-26] 20 (02/06 0630) BP: (127-138)/(54-75) 128/54 (02/06 0630) SpO2:  [99 %-100 %] 99 % (02/06 0630) Weight:  [83 kg] 83 kg (02/06 0024)   General:   Pleasant, cooperative in NAD Head:  Normocephalic and atraumatic. Eyes:   No icterus.   Conjunctiva pink.  PERRLA. Ears:  Normal auditory acuity. Neck:  Supple; no masses or thyroidomegaly Lungs: Respirations even and unlabored. Lungs clear to auscultation bilaterally.   No wheezes, crackles, or rhonchi.  Heart:  Regular rate and rhythm;  Without murmur, clicks, rubs or gallops Abdomen:  Soft, nondistended, nontender. Normal bowel sounds. No appreciable masses or hepatomegaly.  No rebound or guarding.  Rectal:  Not performed. Msk:  Symmetrical without  gross deformities.   Extremities:  Without edema, cyanosis or clubbing. Neurologic:  Alert and oriented x3;  grossly normal neurologically. Skin:  Intact without significant lesions or rashes. Cervical Nodes:  No significant cervical adenopathy. Psych:  Alert and cooperative. Normal affect.  LAB RESULTS: Recent Labs    01/06/23 0118 01/08/23 0027  WBC 10.4 11.5*  HGB 11.6* 7.4*  HCT 33.9* 22.3*  PLT 127* 133*   BMET Recent Labs    01/06/23 0118 01/08/23 0027  NA 132* 135  K 4.3 4.1  CL 102 102  CO2 20* 24  GLUCOSE 141* 166*  BUN 62* 59*  CREATININE 1.11 1.19  CALCIUM 8.4* 8.2*   LFT Recent Labs    01/06/23 0118 01/08/23 0027  PROT 5.9* 4.9*  ALBUMIN 3.7 3.0*  AST 22 23  ALT 23 18  ALKPHOS 66 55  BILITOT 1.8* 0.9  BILIDIR 0.4*  --   IBILI 1.4*  --    PT/INR Recent Labs    01/08/23 0027  LABPROT 14.2  INR 1.1    STUDIES: DG Chest Portable 1 View  Result Date: 01/08/2023 CLINICAL DATA:  Altered mental EXAM: PORTABLE CHEST 1 VIEW COMPARISON:  01/06/2023 FINDINGS: Heart and mediastinal contours are within normal limits. No focal opacities or effusions. No acute bony abnormality. Loop recorder device projects over the left chest. Aortic atherosclerosis. IMPRESSION: No active disease. Electronically Signed   By: Rolm Baptise M.D.   On: 01/08/2023 02:05   CT Cervical Spine Wo Contrast  Result Date: 01/08/2023 CLINICAL DATA:  Status post fall. EXAM: CT CERVICAL SPINE WITHOUT CONTRAST TECHNIQUE: Multidetector CT imaging  of the cervical spine was performed without intravenous contrast. Multiplanar CT image reconstructions were also generated. RADIATION DOSE REDUCTION: This exam was performed according to the departmental dose-optimization program which includes automated exposure control, adjustment of the mA and/or kV according to patient size and/or use of iterative reconstruction technique. COMPARISON:  January 06, 2023 FINDINGS: Alignment: Normal. Skull base and vertebrae: No acute fracture. No primary bone lesion or focal pathologic process. Soft tissues and spinal canal: No prevertebral fluid or swelling. No visible canal hematoma. Disc levels: Marked severity endplate sclerosis is seen throughout the cervical spine, most prominent at the levels of C4-C5, C5-C6, C6-C7 and C7-T1. Moderate severity anterior osteophyte formation and posterior bony spurring are also seen at the levels of C4-C5, C5-C6 and C6-C7. There is marked severity intervertebral disc space narrowing seen at C4-C5, C5-C6, C6-C7 and C7-T1. Bilateral marked severity multilevel facet joint hypertrophy is noted. Upper chest: Negative. Other: None. IMPRESSION: 1. No acute fracture or subluxation in the cervical spine. 2. Marked severity multilevel degenerative changes. Electronically Signed   By: Virgina Norfolk M.D.   On: 01/08/2023 01:30   CT HEAD WO CONTRAST (5MM)  Result Date: 01/08/2023 CLINICAL DATA:  Status post fall. EXAM: CT HEAD WITHOUT CONTRAST TECHNIQUE: Contiguous axial images were obtained from the base of the skull through the vertex without intravenous contrast. RADIATION DOSE REDUCTION: This exam was performed according to the departmental dose-optimization program which includes automated exposure control, adjustment of the mA and/or kV according to patient size and/or use of iterative reconstruction technique. COMPARISON:  January 06, 2023 FINDINGS: Brain: There is moderate severity cerebral atrophy with widening of the extra-axial spaces  and ventricular dilatation. There are areas of decreased attenuation within the white matter tracts of the supratentorial brain, consistent with microvascular disease changes. A chronic left posterior parietal infarct is seen. Vascular: There is marked severity calcification  of the bilateral cavernous carotid arteries. Skull: Normal. Negative for fracture or focal lesion. Sinuses/Orbits: No hyperdense vessel or unexpected calcification. Other: None. IMPRESSION: 1. No acute intracranial abnormality. 2. Chronic left posterior parietal infarct. 3. Cerebral atrophy and microvascular disease changes of the supratentorial brain. Electronically Signed   By: Virgina Norfolk M.D.   On: 01/08/2023 01:28      Impression / Plan:   Assessment: Principal Problem:   ABLA (acute blood loss anemia) Active Problems:   HYPERTENSION, BENIGN   CAD, NATIVE VESSEL   Essential tremor   Sinus bradycardia   History of CVA (cerebrovascular accident)   Coffee ground emesis   Atrial fibrillation, new onset ([place])   Acute confusion   Calvin Smiling Zacari Stiff. is a 89 y.o. y/o male with significant anemia with a hemoglobin drop of 11.6 that was checked 2 days ago down to 7.4 with a report of possible coffee-ground emesis.  This drop in hemoglobin being so significant is unlikely solely from a GI bleed without much more blood being apparent either rectally or from his vomitus.  Plan:  Since the patient had hematemesis patient will be set up for an EGD for today.  I have contacted the hospitalist to consider other possible causes of the patient's anemia since he has had 2 recent falls.  The patient's last colonoscopy appears to be in 2014.  I spoke to the patient's wife about the upper endoscopy today and she agrees to the procedure.  Thank you for involving me in the care of this patient.      LOS: 0 days   Calvin Lame, MD, [facility] 01/08/2023, 8:51 AM,  Pager [phone] 7am-5pm  Check AMION for 5pm -7am coverage and  on weekends   Note: This dictation was prepared with Dragon dictation along with smaller phrase technology. Any transcriptional errors that result from this process are unintentional.

## 2023-01-08 NOTE — Progress Notes (Signed)
Patient admitted early this morning for altered mental status and was found to have acute blood loss anemia possibly from upper GI bleeding with hemoglobin of 7.4.  1 unit packed red cell transfusion has been ordered in the ED.  Currently on IV Protonix.  GI has been consulted.  Patient seen and examined at bedside and plan of care discussed with him/wife at bedside.  I have reviewed patient's medical records including this morning's H&P, current vitals, labs and medications myself.  Monitor H&H.  Continue IV Protonix.  Follow GI recommendations.

## 2023-01-08 NOTE — Assessment & Plan Note (Addendum)
Coffee-ground emesis Hemoglobin 7.4 on admission down from 11.6 on 2/4 with reports of coffee-ground emesis in the past couple days, improved to 8 with 1 unit of PRBC followed by another decreased to 7.1 this morning.  No obvious bleeding EGD with a clean-based gastric ulcer with no active bleeding. No more coffee-ground emesis or melena. GI signed off, recommending twice daily Protonix for 8 weeks.  Also Folate normal with borderline B12 at 390, goal is above 400 -Ordered 1 more unit of PRBC -Start him on B12 supplement -Iron studies ordered -Monitor hemoglobin

## 2023-01-09 ENCOUNTER — Encounter: Payer: Self-pay | Admitting: Internal Medicine

## 2023-01-09 DIAGNOSIS — I25118 Atherosclerotic heart disease of native coronary artery with other forms of angina pectoris: Secondary | ICD-10-CM

## 2023-01-09 DIAGNOSIS — R410 Disorientation, unspecified: Secondary | ICD-10-CM

## 2023-01-09 DIAGNOSIS — R001 Bradycardia, unspecified: Secondary | ICD-10-CM

## 2023-01-09 DIAGNOSIS — I10 Essential (primary) hypertension: Secondary | ICD-10-CM

## 2023-01-09 DIAGNOSIS — K920 Hematemesis: Secondary | ICD-10-CM

## 2023-01-09 DIAGNOSIS — Z8673 Personal history of transient ischemic attack (TIA), and cerebral infarction without residual deficits: Secondary | ICD-10-CM

## 2023-01-09 LAB — CBC
HCT: 22.1 % — ABNORMAL LOW (ref 39.0–52.0)
Hemoglobin: 7.5 g/dL — ABNORMAL LOW (ref 13.0–17.0)
MCH: 31.8 pg (ref 26.0–34.0)
MCHC: 33.9 g/dL (ref 30.0–36.0)
MCV: 93.6 fL (ref 80.0–100.0)
Platelets: 94 10*3/uL — ABNORMAL LOW (ref 150–400)
RBC: 2.36 MIL/uL — ABNORMAL LOW (ref 4.22–5.81)
RDW: 14.6 % (ref 11.5–15.5)
WBC: 8.8 10*3/uL (ref 4.0–10.5)
nRBC: 0 % (ref 0.0–0.2)

## 2023-01-09 LAB — COMPREHENSIVE METABOLIC PANEL
ALT: 16 U/L (ref 0–44)
AST: 21 U/L (ref 15–41)
Albumin: 3.1 g/dL — ABNORMAL LOW (ref 3.5–5.0)
Alkaline Phosphatase: 42 U/L (ref 38–126)
Anion gap: 4 — ABNORMAL LOW (ref 5–15)
BUN: 39 mg/dL — ABNORMAL HIGH (ref 8–23)
CO2: 22 mmol/L (ref 22–32)
Calcium: 8 mg/dL — ABNORMAL LOW (ref 8.9–10.3)
Chloride: 113 mmol/L — ABNORMAL HIGH (ref 98–111)
Creatinine, Ser: 1.15 mg/dL (ref 0.61–1.24)
GFR, Estimated: >60 mL/min (ref >60)
Glucose, Bld: 131 mg/dL — ABNORMAL HIGH (ref 70–99)
Potassium: 3.9 mmol/L (ref 3.5–5.1)
Sodium: 139 mmol/L (ref 135–145)
Total Bilirubin: 1.4 mg/dL — ABNORMAL HIGH (ref 0.3–1.2)
Total Protein: 4.8 g/dL — ABNORMAL LOW (ref 6.5–8.1)

## 2023-01-09 LAB — PREPARE RBC (CROSSMATCH)

## 2023-01-09 LAB — FOLATE: Folate: 35 ng/mL (ref >5.9)

## 2023-01-09 LAB — CBC WITH DIFFERENTIAL/PLATELET
Abs Immature Granulocytes: 0.04 10*3/uL (ref 0.00–0.07)
Basophils Absolute: 0 10*3/uL (ref 0.0–0.1)
Basophils Relative: 0 %
Eosinophils Absolute: 0.3 10*3/uL (ref 0.0–0.5)
Eosinophils Relative: 3 %
HCT: 20.8 % — ABNORMAL LOW (ref 39.0–52.0)
Hemoglobin: 7.1 g/dL — ABNORMAL LOW (ref 13.0–17.0)
Immature Granulocytes: 0 %
Lymphocytes Relative: 23 %
Lymphs Abs: 2.2 10*3/uL (ref 0.7–4.0)
MCH: 32.3 pg (ref 26.0–34.0)
MCHC: 34.1 g/dL (ref 30.0–36.0)
MCV: 94.5 fL (ref 80.0–100.0)
Monocytes Absolute: 0.7 10*3/uL (ref 0.1–1.0)
Monocytes Relative: 7 %
Neutro Abs: 6.5 10*3/uL (ref 1.7–7.7)
Neutrophils Relative %: 67 %
Platelets: 104 10*3/uL — ABNORMAL LOW (ref 150–400)
RBC: 2.2 MIL/uL — ABNORMAL LOW (ref 4.22–5.81)
RDW: 14.6 % (ref 11.5–15.5)
WBC: 9.8 10*3/uL (ref 4.0–10.5)
nRBC: 0 % (ref 0.0–0.2)

## 2023-01-09 LAB — FERRITIN: Ferritin: 72 ng/mL (ref 24–336)

## 2023-01-09 LAB — IRON AND TIBC
Iron: 56 ug/dL (ref 45–182)
Saturation Ratios: 22 % (ref 17.9–39.5)
TIBC: 258 ug/dL (ref 250–450)
UIBC: 202 ug/dL

## 2023-01-09 LAB — MAGNESIUM: Magnesium: 1.9 mg/dL (ref 1.7–2.4)

## 2023-01-09 LAB — VITAMIN B12: Vitamin B-12: 396 pg/mL (ref 180–914)

## 2023-01-09 MED ORDER — PANTOPRAZOLE SODIUM 40 MG PO TBEC
40.0000 mg | DELAYED_RELEASE_TABLET | Freq: Two times a day (BID) | ORAL | Status: DC
  Administered 2023-01-09 – 2023-01-11 (×5): 40 mg via ORAL
  Filled 2023-01-09 (×5): qty 1

## 2023-01-09 MED ORDER — VITAMIN B-12 1000 MCG PO TABS
1000.0000 ug | ORAL_TABLET | Freq: Every day | ORAL | Status: DC
  Administered 2023-01-09 – 2023-01-11 (×3): 1000 ug via ORAL
  Filled 2023-01-09 (×3): qty 1

## 2023-01-09 MED ORDER — TAMSULOSIN HCL 0.4 MG PO CAPS
0.8000 mg | ORAL_CAPSULE | Freq: Every day | ORAL | Status: DC
  Administered 2023-01-09 – 2023-01-11 (×3): 0.8 mg via ORAL
  Filled 2023-01-09 (×3): qty 2

## 2023-01-09 MED ORDER — B COMPLEX-C PO TABS
1.0000 | ORAL_TABLET | Freq: Every day | ORAL | Status: DC
  Administered 2023-01-09 – 2023-01-11 (×3): 1 via ORAL
  Filled 2023-01-09 (×3): qty 1

## 2023-01-09 MED ORDER — SODIUM CHLORIDE 0.9% IV SOLUTION: Freq: Once | INTRAVENOUS | Status: AC

## 2023-01-09 MED ORDER — B COMPLEX PO TABS: 1.0000 | ORAL_TABLET | Freq: Every day | ORAL | Status: DC

## 2023-01-09 MED ORDER — VITAMIN D 25 MCG (1000 UNIT) PO TABS
2000.0000 [IU] | ORAL_TABLET | Freq: Every day | ORAL | Status: DC
  Administered 2023-01-09 – 2023-01-11 (×3): 2000 [IU] via ORAL
  Filled 2023-01-09 (×3): qty 2

## 2023-01-09 NOTE — Hospital Course (Addendum)
Taken from prior notes.  Calvin Smiling Ajmal Kathan. is a 89 y.o. male with medical history significant for CAD, CVA 2018, HTN, HLD, essential tremor followed by neurology, bradycardia (beta-blocker intolerant), who presents to the ED for the second time in 3 days with vomiting and diarrhea.  On his first visit to the ED on 01/06/2023 patient, patient presented for fall in the setting of a single episode of vomiting and diarrhea earlier in the day.  CT head was negative and CT abdomen and pelvis was nonacute.  Return visit to the ED because of confusion that started the same evening as well as pallor.  Family also stated that the emesis a couple days prior was coffee-ground. ED course and data review: Low-grade temp of 99.2 with otherwise normal vitals.  Hemoglobin 7.4 down from 11.6 on 2/4.  BUN/creatinine 59/1.19..  Lipase slightly elevated at 56, LFTs normal.  Lactic acid 2.8.  EKG showing A-fib at 102.  Chest x-ray nonacute Patient was given an IV fluid bolus started on a Protonix infusion as well as 1 unit PRBC.    2/7: Vital stable.  Patient has his EGD yesterday which shows a clean-based ulcer in the stomach with no active bleeding.  GI is recommending PPI for 8 weeks.  CBC with another decrease of hemoglobin to 7.1 after improving to 8 with 1 unit of PRBC.  1 more unit ordered.  CMP with mildly elevated bilirubin at 1.4.  Patient with no obvious bleeding, no more coffee-ground emesis or melena.  Folate levels normal, B12 low normal at 390, goal is above 400 so he was started on B12 supplement.  iron studies ordered.

## 2023-01-09 NOTE — Progress Notes (Signed)
Progress Note   Patient: [name]. PPJ:[id number] DOB: Aug 20, 1933 DOA: 01/08/2023     1 DOS: the patient was seen and examined on 01/09/2023   Brief hospital course: Taken from prior notes.  [name]. is a 89 y.o. male with medical history significant for CAD, CVA 2018, HTN, HLD, essential tremor followed by neurology, bradycardia (beta-blocker intolerant), who presents to the ED for the second time in 3 days with vomiting and diarrhea.  On his first visit to the ED on 01/06/2023 patient, patient presented for fall in the setting of a single episode of vomiting and diarrhea earlier in the day.  CT head was negative and CT abdomen and pelvis was nonacute.  Return visit to the ED because of confusion that started the same evening as well as pallor.  Family also stated that the emesis a couple days prior was coffee-ground. ED course and data review: Low-grade temp of 99.2 with otherwise normal vitals.  Hemoglobin 7.4 down from 11.6 on 2/4.  BUN/creatinine 59/1.19..  Lipase slightly elevated at 56, LFTs normal.  Lactic acid 2.8.  EKG showing A-fib at 102.  Chest x-ray nonacute Patient was given an IV fluid bolus started on a Protonix infusion as well as 1 unit PRBC.    2/7: Vital stable.  Patient has his EGD yesterday which shows a clean-based ulcer in the stomach with no active bleeding.  GI is recommending PPI for 8 weeks.  CBC with another decrease of hemoglobin to 7.1 after improving to 8 with 1 unit of PRBC.  1 more unit ordered.  CMP with mildly elevated bilirubin at 1.4.  Patient with no obvious bleeding, no more coffee-ground emesis or melena.  Folate levels normal, B12 low normal at 390, goal is above 400 so he was started on B12 supplement.  iron studies ordered.  Assessment and Plan: * ABLA (acute blood loss anemia) Coffee-ground emesis Hemoglobin 7.4 on admission down from 11.6 on 2/4 with reports of coffee-ground emesis in the past couple days, improved to 8 with  1 unit of PRBC followed by another decreased to 7.1 this morning.  No obvious bleeding EGD with a clean-based gastric ulcer with no active bleeding. No more coffee-ground emesis or melena. GI signed off, recommending twice daily Protonix for 8 weeks.  Also Folate normal with borderline B12 at 390, goal is above 400 -Ordered 1 more unit of PRBC -Start him on B12 supplement -Iron studies ordered -Monitor hemoglobin  Acute confusion Likely delirium related to acute illness.  Per wife he is at baseline Fall precautions Delirium precautions  HYPERTENSION, BENIGN Blood pressure within goal.  Patient is on low-dose lisinopril at home which is being held due to concern of bleeding. -Continue to monitor-will restart home lisinopril as needed Hydralazine IV as needed .  Atrial fibrillation, new onset ([place]) History of bradycardia (beta-blocker intolerant) EKG reading with A-fib with no prior history of same, on personal review patient to have T waves and irregular rhythm.  Not sure whether he really has A-fib or not.  Exam was regular rhythm -Repeat EKG -Continuous cardiac monitoring Had Zio patch in 2023 for syncopal events Not a candidate for anticoagulation in the setting of acute GI bleed   History of CVA (cerebrovascular accident) On aspirin and simvastatin. Holding aspirin for concern of GI bleed  Essential tremor Followed by neurology  CAD, NATIVE VESSEL Hold aspirin, simvastatin, lisinopril, Zetia   Subjective: Patient was seen and examined today.  Denies any pain, nausea  or vomiting.  Did not had any bowel movement.  Per wife never had any melena.  Physical Exam: Vitals:   01/08/23 2005 01/09/23 0038 01/09/23 0318 01/09/23 0812  BP: 135/72 138/67 (!) 147/78 124/60  Pulse: (!) 54 (!) 107 93 89  Resp: 18 18 18 20   Temp: 98.4 F (36.9 C) 98.1 F (36.7 C) 97.7 F (36.5 C) 97.8 F (36.6 C)  TempSrc:      SpO2: 99% 100% 100% 99%  Weight:   80.7 kg   Height:        General.  Chronically ill-appearing elderly man, in no acute distress.  Appears pale. Pulmonary.  Lungs clear bilaterally, normal respiratory effort. CV.  Regular rate and rhythm, no JVD, rub or murmur. Abdomen.  Soft, nontender, nondistended, BS positive. CNS.  Alert and oriented .  No focal neurologic deficit. Extremities.  No edema, no cyanosis, pulses intact and symmetrical. Psychiatry.  Appears to have some cognitive impairment  Data Reviewed: Prior data reviewed  Family Communication: Discussed with multiple family members at bedside  Disposition: Status is: Inpatient Remains inpatient appropriate because: Severity of illness  Planned Discharge Destination: Home  Time spent: 50 minutes  This record has been created using Systems analyst. Errors have been sought and corrected,but may not always be located. Such creation errors do not reflect on the standard of care.   Author: Lorella Nimrod, MD 01/09/2023 1:57 PM  For on call review [email or web address].

## 2023-01-09 NOTE — TOC Initial Note (Signed)
Transition of Care (TOC) - Initial/Assessment Note    Patient Details  Name: [name]. MRN: [id number] Date of Birth: Aug 01, 1933  Transition of Care [facility]) CM/SW Contact:    Laurena Slimmer, RN Phone Number: 01/09/2023, 1:22 PM  Clinical Narrative:                   Transition of Care [facility]) Screening Note   Patient Details  Name: [name]. Date of Birth: October 23, 1933   Transition of Care [facility]) CM/SW Contact:    Laurena Slimmer, RN Phone Number: 01/09/2023, 1:22 PM    Transition of Care Department [facility]) has reviewed patient and no TOC needs have been identified at this time. We will continue to monitor patient advancement through interdisciplinary progression rounds. If new patient transition needs arise, please place a TOC consult.         Patient Goals and CMS Choice            Expected Discharge Plan and Services                                              Prior Living Arrangements/Services                       Activities of Daily Living Home Assistive Devices/Equipment: Eyeglasses ADL Screening (condition at time of admission) Patient's cognitive ability adequate to safely complete daily activities?: Yes Is the patient deaf or have difficulty hearing?: No Does the patient have difficulty seeing, even when wearing glasses/contacts?: No Does the patient have difficulty concentrating, remembering, or making decisions?: No Patient able to express need for assistance with ADLs?: Yes Does the patient have difficulty dressing or bathing?: Yes Independently performs ADLs?: No Communication: Independent Dressing (OT): Independent Grooming: Independent Feeding: Independent Bathing: Independent Toileting: Needs assistance Is this a change from baseline?: Change from baseline, expected to last <3 days In/Out Bed: Needs assistance Is this a change from baseline?: Change from baseline, expected to last <3  days Walks in Home: Needs assistance Is this a change from baseline?: Change from baseline, expected to last <3 days Does the patient have difficulty walking or climbing stairs?: Yes Weakness of Legs: Both Weakness of Arms/Hands: Both  Permission Sought/Granted                  Emotional Assessment              Admission diagnosis:  Upper GI bleed [K92.2] Altered mental status, unspecified altered mental status type [R41.82] Anemia, unspecified type [D64.9] ABLA (acute blood loss anemia) [D62] Patient Active Problem List   Diagnosis Date Noted   ABLA (acute blood loss anemia) 01/08/2023   Coffee ground emesis 53/66/4403   Acute metabolic encephalopathy 47/42/5956   Atrial fibrillation, new onset ([place]) 01/08/2023   Acute confusion 01/08/2023   Abnormal CXR (chest x-ray) 09/12/2022   ASCVD (arteriosclerotic cardiovascular disease) 09/12/2022   Benign prostatic hyperplasia 09/12/2022   DDD (degenerative disc disease), cervical 09/12/2022   ED (erectile dysfunction) 09/12/2022   Encounter for fitting and adjustment of hearing aid 09/12/2022   History of [facility] spotted fever 09/12/2022   Intention tremor 09/12/2022   Lower GI bleed 09/12/2022   Other ill-defined and unknown causes of morbidity and mortality 09/12/2022   Counseling, unspecified 09/12/2022   Sensorineural hearing loss, bilateral  09/12/2022   Small bowel obstruction ([place]) 09/12/2022   Low testosterone 07/06/2020   History of CVA (cerebrovascular accident) 02/28/2020   Cerebral infarction ([place]) 05/10/2019   Exposure to Agent [facility] 05/10/2019   Mononeuritis 05/10/2019   Closed fracture of right hip with routine healing 02/06/2019   Dementia without behavioral disturbance ([place]) 01/06/2019   Femoral neck fracture ([place]) 10/25/2018   Stenosis of right carotid artery 14/43/1540   Acute embolic stroke ([place]) 08/67/6195   Fever 01/07/2017   Pneumonia of right middle lobe due to infectious organism  01/07/2017   Nontraumatic subcortical hemorrhage of left cerebral hemisphere ([place]) 01/06/2017   Parkinsonism 12/27/2016   Unstable balance 12/20/2015   Sinus bradycardia 11/10/2015   Lumbar radiculitis 11/01/2015   Lumbar stenosis with neurogenic claudication 11/01/2015   Osteoarthritis of spine with radiculopathy, lumbar region 11/01/2015   Essential tremor 06/03/2015   Pseudophakia, left eye 01/12/2015   Dizziness 08/21/2012   AK (actinic keratosis) 03/18/2012   History of nonmelanoma skin cancer 03/18/2012   Syncope 04/10/2011   CALF PAIN, LEFT 10/11/2010   DYSPEPSIA 07/31/2010   CHEST DISCOMFORT 07/31/2010   Hyperlipidemia 05/16/2010   HYPERTENSION, BENIGN 05/16/2010   CAD, NATIVE VESSEL 05/16/2010   PCP:  Idelle Crouch, MD Pharmacy:   [place], [place] - [address] [place] [address] Phone: [phone] Fax: [phone]     Social Determinants of Health (SDOH) Social History: SDOH Screenings   Food Insecurity: No Food Insecurity (01/08/2023)  Housing: Low Risk  (01/08/2023)  Transportation Needs: No Transportation Needs (01/08/2023)  Utilities: Not At Risk (01/08/2023)  Tobacco Use: Low Risk  (01/09/2023)   SDOH Interventions:     Readmission Risk Interventions     No data to display

## 2023-01-09 NOTE — Plan of Care (Signed)

## 2023-01-10 DIAGNOSIS — D62 Acute posthemorrhagic anemia: Secondary | ICD-10-CM | POA: Diagnosis not present

## 2023-01-10 LAB — TYPE AND SCREEN
ABO/RH(D): O POS
Antibody Screen: NEGATIVE
Unit division: 0
Unit division: 0

## 2023-01-10 LAB — CBC
HCT: 20.9 % — ABNORMAL LOW (ref 39.0–52.0)
Hemoglobin: 7.4 g/dL — ABNORMAL LOW (ref 13.0–17.0)
MCH: 33.2 pg (ref 26.0–34.0)
MCHC: 35.4 g/dL (ref 30.0–36.0)
MCV: 93.7 fL (ref 80.0–100.0)
Platelets: 89 10*3/uL — ABNORMAL LOW (ref 150–400)
RBC: 2.23 MIL/uL — ABNORMAL LOW (ref 4.22–5.81)
RDW: 14.5 % (ref 11.5–15.5)
WBC: 9.2 10*3/uL (ref 4.0–10.5)
nRBC: 0 % (ref 0.0–0.2)

## 2023-01-10 LAB — BPAM RBC
Blood Product Expiration Date: 202403082359
Blood Product Expiration Date: 202403162359
ISSUE DATE / TIME: 202402060305
ISSUE DATE / TIME: 202402071444
Unit Type and Rh: 5100
Unit Type and Rh: 5100

## 2023-01-10 LAB — LACTIC ACID, PLASMA: Lactic Acid, Venous: 1.2 mmol/L (ref 0.5–1.9)

## 2023-01-10 LAB — HEMOGLOBIN AND HEMATOCRIT, BLOOD
HCT: 23.6 % — ABNORMAL LOW (ref 39.0–52.0)
Hemoglobin: 8 g/dL — ABNORMAL LOW (ref 13.0–17.0)

## 2023-01-10 NOTE — TOC Initial Note (Signed)
Transition of Care (TOC) - Initial/Assessment Note    Patient Details  Name: [name]. MRN: [id number] Date of Birth: [birth year]  Transition of Care [facility]) CM/SW Contact:    Tiburcio Bash, LCSW Phone Number: 01/10/2023, 9:42 AM  Clinical Narrative:                  Per Aldona Bar with [facility] they are active with patient and will continue to service at discharge.   TOC will continue to follow for additional needs.   Expected Discharge Plan: [place] Barriers to Discharge: Continued Medical Work up   Patient Goals and CMS Choice Patient states their goals for this hospitalization and ongoing recovery are:: to go home CMS Medicare.gov Compare Post Acute Care list provided to:: Patient        Expected Discharge Plan and Services                                              Prior Living Arrangements/Services                       Activities of Daily Living Home Assistive Devices/Equipment: Eyeglasses ADL Screening (condition at time of admission) Patient's cognitive ability adequate to safely complete daily activities?: Yes Is the patient deaf or have difficulty hearing?: No Does the patient have difficulty seeing, even when wearing glasses/contacts?: No Does the patient have difficulty concentrating, remembering, or making decisions?: No Patient able to express need for assistance with ADLs?: Yes Does the patient have difficulty dressing or bathing?: Yes Independently performs ADLs?: No Communication: Independent Dressing (OT): Independent Grooming: Independent Feeding: Independent Bathing: Independent Toileting: Needs assistance Is this a change from baseline?: Change from baseline, expected to last <3 days In/Out Bed: Needs assistance Is this a change from baseline?: Change from baseline, expected to last <3 days Walks in Home: Needs assistance Is this a change from baseline?: Change from baseline,  expected to last <3 days Does the patient have difficulty walking or climbing stairs?: Yes Weakness of Legs: Both Weakness of Arms/Hands: Both  Permission Sought/Granted                  Emotional Assessment              Admission diagnosis:  Upper GI bleed [K92.2] Altered mental status, unspecified altered mental status type [R41.82] Anemia, unspecified type [D64.9] ABLA (acute blood loss anemia) [D62] Patient Active Problem List   Diagnosis Date Noted   ABLA (acute blood loss anemia) 01/08/2023   Coffee ground emesis 46/65/9935   Acute metabolic encephalopathy 70/17/7939   Atrial fibrillation, new onset ([place]) 01/08/2023   Acute confusion 01/08/2023   Abnormal CXR (chest x-ray) 09/12/2022   ASCVD (arteriosclerotic cardiovascular disease) 09/12/2022   Benign prostatic hyperplasia 09/12/2022   DDD (degenerative disc disease), cervical 09/12/2022   ED (erectile dysfunction) 09/12/2022   Encounter for fitting and adjustment of hearing aid 09/12/2022   History of [facility] spotted fever 09/12/2022   Intention tremor 09/12/2022   Lower GI bleed 09/12/2022   Other ill-defined and unknown causes of morbidity and mortality 09/12/2022   Counseling, unspecified 09/12/2022   Sensorineural hearing loss, bilateral 09/12/2022   Small bowel obstruction ([place]) 09/12/2022   Low testosterone 07/06/2020   History of CVA (cerebrovascular accident) 02/28/2020   Cerebral infarction (  [employer]) 05/10/2019   Exposure to Agent [facility] 05/10/2019   Mononeuritis 05/10/2019   Closed fracture of right hip with routine healing 02/06/2019   Dementia without behavioral disturbance ([place]) 01/06/2019   Femoral neck fracture ([place]) 10/25/2018   Stenosis of right carotid artery 17/49/4496   Acute embolic stroke ([place]) 75/91/6384   Fever 01/07/2017   Pneumonia of right middle lobe due to infectious organism 01/07/2017   Nontraumatic subcortical hemorrhage of left cerebral hemisphere ([place]) 01/06/2017    Parkinsonism 12/27/2016   Unstable balance 12/20/2015   Sinus bradycardia 11/10/2015   Lumbar radiculitis 11/01/2015   Lumbar stenosis with neurogenic claudication 11/01/2015   Osteoarthritis of spine with radiculopathy, lumbar region 11/01/2015   Essential tremor 06/03/2015   Pseudophakia, left eye 01/12/2015   Dizziness 08/21/2012   AK (actinic keratosis) 03/18/2012   History of nonmelanoma skin cancer 03/18/2012   Syncope 04/10/2011   CALF PAIN, LEFT 10/11/2010   DYSPEPSIA 07/31/2010   CHEST DISCOMFORT 07/31/2010   Hyperlipidemia 05/16/2010   HYPERTENSION, BENIGN 05/16/2010   CAD, NATIVE VESSEL 05/16/2010   PCP:  Idelle Crouch, MD Pharmacy:   [place], [place] - [place] [address] Phone: [phone] Fax: [phone]     Social Determinants of Health (SDOH) Social History: SDOH Screenings   Food Insecurity: No Food Insecurity (01/08/2023)  Housing: Low Risk  (01/08/2023)  Transportation Needs: No Transportation Needs (01/08/2023)  Utilities: Not At Risk (01/08/2023)  Tobacco Use: Low Risk  (01/09/2023)   SDOH Interventions:     Readmission Risk Interventions     No data to display

## 2023-01-10 NOTE — Plan of Care (Signed)

## 2023-01-10 NOTE — Progress Notes (Signed)
PROGRESS NOTE    [name] [employer]  ZOX:[id number] DOB: [birth year] DOA: 01/08/2023 PCP: Idelle Crouch, MD   Brief Narrative: This 89 y.o. male with medical history significant for CAD, CVA 2018, HTN, HLD, essential tremor followed by neurology, bradycardia (beta-blocker intolerant), who presents to the ED for the second time in 3 days with vomiting and diarrhea.  On his first visit to the ED on 01/06/2023 patient, patient presented s/p fall in the setting of a single episode of vomiting and diarrhea earlier in the day.  CT head was negative and CT abdomen and pelvis was nonacute.  Return visit to the ED because of confusion that started the same evening as well as pallor.  Family also stated that he had emesis a couple days prior was coffee-ground. He is found to have Hemoglobin 7.4 down from 11.6 on 2/4.  BUN/creatinine 59/1.19..  Lipase slightly elevated at 56, LFTs normal.  Lactic acid 2.8.  EKG showing A-fib at 102.  Chest x-ray nonacute Patient was given an IV fluid bolus,  started on a Protonix infusion as well as 1 unit PRBC.     2/7: Vital stable.  Patient has his EGD  which shows a clean-based ulcer in the stomach with no active bleeding.  GI is recommending PPI for 8 weeks. CBC with another decrease of hemoglobin to 7.1 after improving to 8 with 1 unit of PRBC.  1 more unit ordered.  CMP with mildly elevated bilirubin at 1.4.  Patient with no obvious bleeding, no more coffee-ground emesis or melena.  Folate levels normal, B12 low normal at 390, goal is above 400 so he was started on B12 supplement. iron studies ordered.  Assessment & Plan:   Principal Problem:   ABLA (acute blood loss anemia) Active Problems:   Coffee ground emesis   Acute confusion   HYPERTENSION, BENIGN   Atrial fibrillation, new onset (HCC)   CAD, NATIVE VESSEL   Essential tremor   Sinus bradycardia   History of CVA (cerebrovascular accident)  Acute blood loss anemia: Coffee-ground  emesis: Hemoglobin 7.4 on admission down from 11.6 on 2/4 with reports of coffee-ground emesis in the past couple days. S/p 2 unit PRBCs hemoglobin 7.4. >  8.0 EGD with a clean-based gastric ulcer with no active bleeding. No more coffee-ground emesis or melena. GI signed off, recommending twice daily Protonix for 8 weeks.  Also Folate normal with borderline B12 at 390, goal is above 400 Continue vitamin B12 supplementation.  Iron profile within normal limits.  Acute confusion: > Resolved. Likely delirium related to acute illness.  Per wife he is at baseline Fall precautions Delirium precautions   Essential HTN: Blood pressure within goal.  Patient is on low-dose lisinopril at home which is being held due to concern of bleeding. Restart lisinopril at discharge. Hydralazine IV as needed .   Atrial fibrillation, new onset: History of bradycardia (beta-blocker intolerant) EKG showed  A-fib with no prior history of  A.fib ,  Not sure whether he really has A-fib or not.  Exam was regular rhythm -Repeat EKG showed A. Fibrillation. -Continuous cardiac monitoring Had Zio patch in 2023 for syncopal events. Not a candidate for anticoagulation in the setting of acute GI bleed   History of CVA (cerebrovascular accident) Continue aspirin and simvastatin. Holding aspirin for concern of GI bleed   Essential tremor Followed by neurology   CAD, NATIVE VESSEL Resume aspirin, simvastatin, lisinopril, Zetia   DVT prophylaxis: SCDs Code Status: Full code Family Communication:  Family at bed side. Disposition Plan:    Status is: Inpatient Remains inpatient appropriate because: Admitted for acute blood loss anemia coffee-ground emesis.  Underwent EGD found to have clean-based gastric ulcer with no active bleeding.  H&H remains stable.  Anticipated discharge home on 01/11/2023.    Consultants:  Gastroenterology  Procedures: S/p EGD Antimicrobials: None  Subjective: Patient was seen and  examined at bedside.  Overnight events noted.   Patient reports doing better.  Patient is s/p EGD found to have clean-based stomach ulcer without active bleeding.  H&H remains stable.  Objective: Vitals:   01/10/23 0759 01/10/23 0901 01/10/23 1100 01/10/23 1158  BP: (!) 126/59  (!) 135/53 122/61  Pulse: 62  81 72  Resp: 16  18 14   Temp: 97.8 F (36.6 C)  98.7 F (37.1 C) 98.1 F (36.7 C)  TempSrc: Oral  Oral   SpO2: 99%  99% 98%  Weight:  82.2 kg    Height:        Intake/Output Summary (Last 24 hours) at 01/10/2023 1230 Last data filed at 01/10/2023 0801 Gross per 24 hour  Intake 709.17 ml  Output 225 ml  Net 484.17 ml   Filed Weights   01/08/23 0024 01/09/23 0318 01/10/23 0901  Weight: 83 kg 80.7 kg 82.2 kg    Examination:  General exam: Appears calm and comfortable, not in any acute distress. Respiratory system: Clear to auscultation. Respiratory effort normal.  RR 15 Cardiovascular system: S1 & S2 heard, irregular rhythm, no murmur. Gastrointestinal system: Abdomen is soft, non tender, non distended, BS+ Central nervous system: Alert and oriented x 2. No focal neurological deficits. Extremities: No edema, no cyanosis, no clubbing Skin: No rashes, lesions or ulcers Psychiatry: Judgement and insight appear normal. Mood & affect appropriate.     Data Reviewed: I have personally reviewed following labs and imaging studies  CBC: Recent Labs  Lab 01/06/23 0118 01/08/23 0027 01/08/23 0843 01/09/23 0515 01/09/23 1837 01/10/23 0222 01/10/23 1018  WBC 10.4 11.5*  --  9.8 8.8 9.2  --   NEUTROABS  --  6.2  --  6.5  --   --   --   HGB 11.6* 7.4* 8.0* 7.1* 7.5* 7.4* 8.0*  HCT 33.9* 22.3*  --  20.8* 22.1* 20.9* 23.6*  MCV 93.1 96.1  --  94.5 93.6 93.7  --   PLT 127* 133*  --  104* 94* 89*  --    Basic Metabolic Panel: Recent Labs  Lab 01/06/23 0118 01/08/23 0027 01/09/23 0515  NA 132* 135 139  K 4.3 4.1 3.9  CL 102 102 113*  CO2 20* 24 22  GLUCOSE 141* 166*  131*  BUN 62* 59* 39*  CREATININE 1.11 1.19 1.15  CALCIUM 8.4* 8.2* 8.0*  MG  --   --  1.9   GFR: Estimated Creatinine Clearance: 47.3 mL/min (by C-G formula based on SCr of 1.15 mg/dL). Liver Function Tests: Recent Labs  Lab 01/06/23 0118 01/08/23 0027 01/09/23 0515  AST 22 23 21   ALT 23 18 16   ALKPHOS 66 55 42  BILITOT 1.8* 0.9 1.4*  PROT 5.9* 4.9* 4.8*  ALBUMIN 3.7 3.0* 3.1*   Recent Labs  Lab 01/06/23 0118 01/08/23 0027  LIPASE 41 56*   No results for input(s): "AMMONIA" in the last 168 hours. Coagulation Profile: Recent Labs  Lab 01/08/23 0027  INR 1.1   Cardiac Enzymes: No results for input(s): "CKTOTAL", "CKMB", "CKMBINDEX", "TROPONINI" in the last 168 hours. BNP (last 3  results) No results for input(s): "PROBNP" in the last 8760 hours. HbA1C: No results for input(s): "HGBA1C" in the last 72 hours. CBG: No results for input(s): "GLUCAP" in the last 168 hours. Lipid Profile: No results for input(s): "CHOL", "HDL", "LDLCALC", "TRIG", "CHOLHDL", "LDLDIRECT" in the last 72 hours. Thyroid Function Tests: Recent Labs    01/08/23 0027  TSH 9.197*  FREET4 0.72   Anemia Panel: Recent Labs    01/09/23 0515  VITAMINB12 396  FOLATE 35.0  FERRITIN 72  TIBC 258  IRON 56   Sepsis Labs: Recent Labs  Lab 01/08/23 0027 01/08/23 0201 01/10/23 1018  PROCALCITON <0.10  --   --   LATICACIDVEN 3.1* 2.8* 1.2    Recent Results (from the past 240 hour(s))  Resp panel by RT-PCR (RSV, Flu A&B, Covid) Anterior Nasal Swab     Status: None   Collection Time: 01/06/23  2:46 AM   Specimen: Anterior Nasal Swab  Result Value Ref Range Status   SARS Coronavirus 2 by RT PCR NEGATIVE NEGATIVE Final    Comment: (NOTE) SARS-CoV-2 target nucleic acids are NOT DETECTED.  The SARS-CoV-2 RNA is generally detectable in upper respiratory specimens during the acute phase of infection. The lowest concentration of SARS-CoV-2 viral copies this assay can detect is 138  copies/mL. A negative result does not preclude SARS-Cov-2 infection and should not be used as the sole basis for treatment or other patient management decisions. A negative result may occur with  improper specimen collection/handling, submission of specimen other than nasopharyngeal swab, presence of viral mutation(s) within the areas targeted by this assay, and inadequate number of viral copies(<138 copies/mL). A negative result must be combined with clinical observations, patient history, and epidemiological information. The expected result is Negative.  Fact Sheet for Patients:  BloggerCourse.com  Fact Sheet for Healthcare Providers:  SeriousBroker.it  This test is no t yet approved or cleared by the [place] FDA and  has been authorized for detection and/or diagnosis of SARS-CoV-2 by FDA under an Emergency Use Authorization (EUA). This EUA will remain  in effect (meaning this test can be used) for the duration of the COVID-19 declaration under Section 564(b)(1) of the Act, 21 U.S.C.section 360bbb-3(b)(1), unless the authorization is terminated  or revoked sooner.       Influenza A by PCR NEGATIVE NEGATIVE Final   Influenza B by PCR NEGATIVE NEGATIVE Final    Comment: (NOTE) The Xpert Xpress SARS-CoV-2/FLU/RSV plus assay is intended as an aid in the diagnosis of influenza from Nasopharyngeal swab specimens and should not be used as a sole basis for treatment. Nasal washings and aspirates are unacceptable for Xpert Xpress SARS-CoV-2/FLU/RSV testing.  Fact Sheet for Patients: BloggerCourse.com  Fact Sheet for Healthcare Providers: SeriousBroker.it  This test is not yet approved or cleared by the [place] FDA and has been authorized for detection and/or diagnosis of SARS-CoV-2 by FDA under an Emergency Use Authorization (EUA). This EUA will remain in effect (meaning  this test can be used) for the duration of the COVID-19 declaration under Section 564(b)(1) of the Act, 21 U.S.C. section 360bbb-3(b)(1), unless the authorization is terminated or revoked.     Resp Syncytial Virus by PCR NEGATIVE NEGATIVE Final    Comment: (NOTE) Fact Sheet for Patients: BloggerCourse.com  Fact Sheet for Healthcare Providers: SeriousBroker.it  This test is not yet approved or cleared by the [place] FDA and has been authorized for detection and/or diagnosis of SARS-CoV-2 by FDA under an Emergency Use Authorization (EUA).  This EUA will remain in effect (meaning this test can be used) for the duration of the COVID-19 declaration under Section 564(b)(1) of the Act, 21 U.S.C. section 360bbb-3(b)(1), unless the authorization is terminated or revoked.  Performed at [facility], [address] [place], [place] [address]   Blood culture (routine x 2)     Status: None (Preliminary result)   Collection Time: 01/08/23 12:27 AM   Specimen: BLOOD  Result Value Ref Range Status   Specimen Description BLOOD  RIGHT FOREARM  Final   Special Requests   Final    BOTTLES DRAWN AEROBIC AND ANAEROBIC Blood Culture adequate volume   Culture   Final    NO GROWTH 2 DAYS Performed at [facility], [address], [place], [place] [address]    Report Status PENDING  Incomplete  Blood culture (routine x 2)     Status: None (Preliminary result)   Collection Time: 01/08/23 12:27 AM   Specimen: BLOOD  Result Value Ref Range Status   Specimen Description BLOOD  LEFT FOREARM  Final   Special Requests   Final    BOTTLES DRAWN AEROBIC AND ANAEROBIC Blood Culture adequate volume   Culture   Final    NO GROWTH 2 DAYS Performed at [facility], [address] [place], [place] [address]    Report Status PENDING  Incomplete   Radiology Studies: No results found.  Scheduled Meds:  B-complex with  vitamin C  1 tablet Oral Daily   cholecalciferol  2,000 Units Oral Daily   vitamin B-12  1,000 mcg Oral Daily   pantoprazole  40 mg Oral BID   tamsulosin  0.8 mg Oral Daily   Continuous Infusions:  sodium chloride Stopped (01/08/23 0133)   sodium chloride 75 mL/hr at 01/10/23 0535     LOS: 2 days    Time spent: 50 mins    Shahara Hartsfield, MD [facility]   If 7PM-7AM, please contact night-coverage

## 2023-01-11 DIAGNOSIS — D62 Acute posthemorrhagic anemia: Secondary | ICD-10-CM | POA: Diagnosis not present

## 2023-01-11 LAB — T3, FREE: T3, Free: 2.6 pg/mL (ref 2.0–4.4)

## 2023-01-11 MED ORDER — PANTOPRAZOLE SODIUM 40 MG PO TBEC: 40.0000 mg | DELAYED_RELEASE_TABLET | Freq: Two times a day (BID) | ORAL | 1 refills | Status: DC

## 2023-01-11 NOTE — Discharge Instructions (Signed)
Advised to follow-up with gastroenterology in 2 to 3 weeks. Advised to take pantoprazole 40 mg twice daily for 12 weeks.

## 2023-01-11 NOTE — Evaluation (Signed)
Physical Therapy Evaluation Patient Details Name: [name]. MRN: [id number] DOB: 1933-05-24 Today's Date: 01/11/2023  History of Present Illness  Pt is an 89 y/o M admitted on 01/08/23 after presenting for the 2nd time in 3 days with vomiting & diarrhea. Pt is being treated for ABLA. PMH: CAD, CVA 2018, HTN, HLD, essential tremor, bradycardia  Clinical Impression  Pt seen for PT evaluation with pt received in handoff from OT. Prior to admission pt was independent without AD, ambulating up to 2 miles/day. On this date, pt requires min assist to ambulate without AD, supervision to ambulate with RW. Recommend pt use RW upon d/c home & HHPT f/u. Educated pt on importance of mobility & ability to use RW bag to transport items; pt & wife voice understanding.   Recommendations for follow up therapy are one component of a multi-disciplinary discharge planning process, led by the attending physician.  Recommendations may be updated based on patient status, additional functional criteria and insurance authorization.  Follow Up Recommendations Home health PT      Assistance Recommended at Discharge Intermittent Supervision/Assistance  Patient can return home with the following  A little help with walking and/or transfers;A little help with bathing/dressing/bathroom;Assist for transportation;Help with stairs or ramp for entrance;Assistance with cooking/housework    Equipment Recommendations Rolling walker (2 wheels);BSC/3in1  Recommendations for Other Services       Functional Status Assessment Patient has had a recent decline in their functional status and demonstrates the ability to make significant improvements in function in a reasonable and predictable amount of time.     Precautions / Restrictions Precautions Precautions: Fall Restrictions Weight Bearing Restrictions: No      Mobility  Bed Mobility               General bed mobility comments: not tested, pt received  & left sitting in recliner    Transfers Overall transfer level: Needs assistance Equipment used: None Transfers: Sit to/from Stand Sit to Stand: Min guard           General transfer comment: STS without AD with CGA    Ambulation/Gait Ambulation/Gait assistance: Min assist Gait Distance (Feet):  (7 + 150 ft) Assistive device: Rolling walker (2 wheels), None Gait Pattern/deviations: Decreased step length - right, Decreased step length - left, Decreased stride length Gait velocity: slightly decreased     General Gait Details: RLE externally rotated; pt ambulates without AD with min assist, with RW with supervision with cuing to ambulate within base of AD to promote more upright posture  Stairs            Wheelchair Mobility    Modified Rankin (Stroke Patients Only)       Balance Overall balance assessment: Needs assistance Sitting-balance support: Feet supported Sitting balance-Leahy Scale: Good     Standing balance support: During functional activity, No upper extremity supported Standing balance-Leahy Scale: Poor                               Pertinent Vitals/Pain Pain Assessment Pain Assessment: Faces Faces Pain Scale: No hurt    Home Living Family/patient expects to be discharged to:: Private residence Living Arrangements: Spouse/significant other Available Help at Discharge: Family;Available 24 hours/day (home health aide coming in for short time (new)) Type of Home: House Home Access: Stairs to enter Entrance Stairs-Rails: Left Entrance Stairs-Number of Steps: 1 step through the garage Alternate Level Stairs-Number of Steps: flight  of stairs- no need to go upstairs Home Layout: Multi-level;Able to live on main level with bedroom/bathroom Home Equipment: Grab bars - tub/shower;Grab bars - toilet;Cane - single point      Prior Function Prior Level of Function : Independent/Modified Independent             Mobility Comments: amb  with no AD, ambulates up to 2 miles/day prior to admission ADLs Comments: MOD I in ADL, cooks, drives short distances     Hand Dominance   Dominant Hand:  (ambidextrous)    Extremity/Trunk Assessment   Upper Extremity Assessment Upper Extremity Assessment: Generalized weakness    Lower Extremity Assessment Lower Extremity Assessment: Generalized weakness       Communication   Communication: No difficulties  Cognition Arousal/Alertness: Awake/alert Behavior During Therapy: WFL for tasks assessed/performed Overall Cognitive Status: Within Functional Limits for tasks assessed                                 General Comments: Pt able to follow commands throughout session, expressive difficulties 2/2 hx of stroke.        General Comments      Exercises     Assessment/Plan    PT Assessment Patient needs continued PT services  PT Problem List Decreased strength;Decreased activity tolerance;Decreased balance;Decreased mobility;Decreased safety awareness;Decreased knowledge of use of DME       PT Treatment Interventions DME instruction;Therapeutic exercise;Gait training;Balance training;Stair training;Neuromuscular re-education;Functional mobility training;Therapeutic activities;Patient/family education    PT Goals (Current goals can be found in the Care Plan section)  Acute Rehab PT Goals Patient Stated Goal: get better, return to PLOF PT Goal Formulation: With patient/family Time For Goal Achievement: 01/25/23 Potential to Achieve Goals: Good    Frequency Min 2X/week     Co-evaluation               AM-PAC PT "6 Clicks" Mobility  Outcome Measure Help needed turning from your back to your side while in a flat bed without using bedrails?: None Help needed moving from lying on your back to sitting on the side of a flat bed without using bedrails?: A Little Help needed moving to and from a bed to a chair (including a wheelchair)?: A Little Help  needed standing up from a chair using your arms (e.g., wheelchair or bedside chair)?: A Little Help needed to walk in hospital room?: A Little Help needed climbing 3-5 steps with a railing? : A Little 6 Click Score: 19    End of Session   Activity Tolerance: Patient tolerated treatment well Patient left: in chair;with family/visitor present Nurse Communication: Mobility status PT Visit Diagnosis: Muscle weakness (generalized) (M62.81);Unsteadiness on feet (R26.81)    Time: [id number] PT Time Calculation (min) (ACUTE ONLY): 16 min   Charges:   PT Evaluation $PT Eval Low Complexity: [place], PT, DPT 01/11/23, 12:20 PM   Waunita Schooner 01/11/2023, 12:19 PM

## 2023-01-11 NOTE — Progress Notes (Signed)
Patient is not able to walk the distance required to go the bathroom, or he/she is unable to safely negotiate stairs required to access the bathroom.  A BSC will alleviate this problem.

## 2023-01-11 NOTE — TOC Transition Note (Addendum)
Transition of Care [facility]) - CM/SW Discharge Note   Patient Details  Name: [name]. MRN: [id number] Date of Birth: 03-03-1933  [facility] of [facility]) CM/SW Contact:  Laurena Slimmer, RN Phone Number: 01/11/2023, 11:10 AM   Clinical Narrative:    Spoke with patient's wife regarding discharge plan. Patient wife requesting patient have [facility] PT. She was advised paitent would have to be evaluated prior to discharge. She is agreeable to [facility] services if recommended and would prefer [facility].   [facility] previously arranged with [facility] tomorrow.   3in1 and RW requested via adapt.   [facility] PT/ OT sent and received by Malachy Mood by [facility].   TOC signing off.      Barriers to Discharge: Continued Medical Work up   Patient Goals and CMS Choice CMS Medicare.gov Compare Post Acute Care list provided to:: Patient    Discharge Placement                         Discharge Plan and Services Additional resources added to the After Visit Summary for                                       Social Determinants of Health (SDOH) Interventions SDOH Screenings   Food Insecurity: No Food Insecurity (01/08/2023)  Housing: Low Risk  (01/08/2023)  Transportation Needs: No Transportation Needs (01/08/2023)  Utilities: Not At Risk (01/08/2023)  Tobacco Use: Low Risk  (01/09/2023)     Readmission Risk Interventions     No data to display

## 2023-01-11 NOTE — Discharge Summary (Signed)
Physician Discharge Summary  [name]. ZN:[id number] DOB: 1933/04/25 DOA: 01/08/2023  PCP: Idelle Crouch, MD  Admit date: 01/08/2023  Discharge date: 01/11/2023  Admitted From: Home  Disposition:  [place]  Recommendations for Outpatient Follow-up:  Follow up with PCP in 1-2 weeks. Please obtain BMP/CBC in one week. Advised to follow-up with Gastroenterology in 2 to 3 weeks. Advised to take pantoprazole 40 mg twice daily for 12 weeks.  Home Health: Home HPT/OT Equipment/Devices:None  Discharge Condition: Stable CODE STATUS: Full code Diet recommendation: Heart Healthy   [facility]rief [facility] Course: This 89 y.o. male with medical history significant for CAD, CVA 2018, HTN, HLD, essential tremor followed by neurology, bradycardia (beta-blocker intolerant), who presents to the ED for the second time in 3 days with vomiting and diarrhea.  On his first visit to the ED on 01/06/2023, patient presented s/p fall in the setting of a single episode of vomiting and diarrhea earlier in the day. CT head was negative and CT abdomen and pelvis was nonacute. He was discharged home. Return visit to the ED because of confusion that started the same evening as well as pallor.  Family also stated that he had emesis a couple days prior was coffee-ground. He is found to have Hemoglobin 7.4 down from 11.6 on 01/06/23.  BUN/creatinine 59/1.19..  Lipase slightly elevated at 56, LFTs normal.  Lactic acid 2.8.  EKG showing A-fib at 102.  Chest x-ray nonacute. Patient was given an IV fluid bolus,  started on a Protonix infusion as well as was given 1 unit PRBC.  Patient was admitted for further management.  Gastroenterology was consulted. He underwent EGD which showed a clean-based ulcer in the stomach with no active bleeding. GI is recommending PPI for 8 weeks. CBC showed drop in Hb 7.1 after improving to 8 with 1 unit of PRBC.  Patient was given 1 more unit of packed red blood cells.   Hemoglobin stays stable afterwards 8.0.   Patient with no obvious bleeding, no more coffee-ground emesis or melena.  Folate levels normal, B12 low normal at 390, goal is above 400 so he was started on B12 supplement. iron studies ordered.  GI signed off.  Patient feels better and want to be discharged.  Home health services arranged.  Patient is being discharged home.  Discharge Diagnoses:  Principal Problem:   ABLA (acute blood loss anemia) Active Problems:   Coffee ground emesis   Acute confusion   HYPERTENSION, BENIGN   Atrial fibrillation, new onset (HCC)   CAD, NATIVE VESSEL   Essential tremor   Sinus bradycardia   History of CVA (cerebrovascular accident)  Acute blood loss anemia: Coffee-ground emesis: Hemoglobin 7.4 on admission down from 11.6 on 01/06/23 with reports of coffee-ground emesis in the past couple days. S/p 2 unit PRBCs hemoglobin 7.4. >  8.0 EGD with a clean-based gastric ulcer with no active bleeding. No more coffee-ground emesis or melena. GI signed off, recommending twice daily Protonix for 8 weeks.  Also Folate normal with borderline B12 at 390, goal is above 400 Continue vitamin B12 supplementation.  Iron profile within normal limits. Hb remains stable.  Patient feels better,  Patient being discharged home. GI signed off.   Acute confusion: > Resolved. Likely delirium related to acute illness.  Per wife he is at baseline. Fall precautions Delirium precautions   Essential HTN: Blood pressure within goal.  Patient is on low-dose lisinopril at home which was held due to concern of bleeding.  Restart lisinopril at discharge. Hydralazine IV as needed .   Atrial fibrillation, new onset: History of bradycardia (beta-blocker intolerant) EKG showed  A-fib with no prior history of  A.fib ,  Not sure whether he really has A-fib or not.  Exam was regular rhythm -Repeat EKG showed A. Fibrillation. Had Zio patch in 2023 for syncopal events. Not a candidate for  anticoagulation in the setting of acute GI bleed.   History of CVA (cerebrovascular accident) Continue aspirin and simvastatin. Holding aspirin for concern of GI bleed. Aspirin resumed.   Essential tremor Followed by neurology   CAD, NATIVE VESSEL Resume aspirin, simvastatin, lisinopril, Zetia  Discharge Instructions  Discharge Instructions     Call MD for:  difficulty breathing, headache or visual disturbances   Complete by: As directed    Call MD for:  persistant dizziness or light-headedness   Complete by: As directed    Call MD for:  persistant nausea and vomiting   Complete by: As directed    Diet - low sodium heart healthy   Complete by: As directed    Diet Carb Modified   Complete by: As directed    Discharge instructions   Complete by: As directed    Advised to follow-up with primary care physician in 1 week. Advised to follow-up with gastroenterology in 2 to 3 weeks. Advised to take pantoprazole 40 mg twice daily for 12 weeks.   Increase activity slowly   Complete by: As directed       Allergies as of 01/11/2023       Reactions   Penicillins Rash   Rash Rash   Gabapentin    Penicillin G    Codeine Nausea Only, Nausea And Vomiting   Other reaction(s): Vomiting Other reaction(s): Vomiting        Medication List     STOP taking these medications    baclofen 20 MG tablet Commonly known as: LIORESAL   clindamycin 150 MG capsule Commonly known as: CLEOCIN   doxycycline 100 MG tablet Commonly known as: VIBRA-TABS       TAKE these medications    acetaminophen 650 MG CR tablet Commonly known as: TYLENOL Take 650 mg by mouth every 8 (eight) hours as needed.   aspirin 81 MG tablet Take 1 tablet (81 mg total) by mouth daily.   b complex vitamins tablet Take 1 tablet by mouth daily.   cholecalciferol 1000 units tablet Commonly known as: VITAMIN D Take 2,000 Units by mouth daily.   cyanocobalamin 1000 MCG tablet Commonly known as: VITAMIN  B12 Take 1,000 mcg by mouth daily.   ezetimibe 10 MG tablet Commonly known as: ZETIA Take 1 tablet (10 mg total) by mouth daily.   FLAXSEED (LINSEED) PO Take 2 g by mouth daily.   lisinopril 10 MG tablet Commonly known as: ZESTRIL Take 10 mg by mouth daily.   mupirocin ointment 2 % Commonly known as: BACTROBAN Apply 1 Application topically 2 (two) times daily.   nitroGLYCERIN 0.4 MG SL tablet Commonly known as: Nitrostat Place 1 tablet (0.4 mg total) under the tongue every 5 (five) minutes as needed for chest pain.   ondansetron 4 MG disintegrating tablet Commonly known as: ZOFRAN-ODT Take 1 tablet (4 mg total) by mouth every 6 (six) hours as needed for nausea or vomiting.   pantoprazole 40 MG tablet Commonly known as: PROTONIX Take 1 tablet (40 mg total) by mouth 2 (two) times daily.   pregabalin 100 MG capsule Commonly known as: LYRICA TAKE  ONE CAPSULE BY MOUTH EVERY MORNING FOR DIABETIC COMPLICATION PERIPHERAL NEUROPATHY What changed: Another medication with the same name was removed. Continue taking this medication, and follow the directions you see here.   pregabalin 150 MG capsule Commonly known as: LYRICA TAKE ONE CAPSULE BY MOUTH AT BEDTIME FOR DIABETIC COMPLICATION NOTE: TAKING 100MG DAILY AND 150MG AT NIGHT. What changed: Another medication with the same name was removed. Continue taking this medication, and follow the directions you see here.   simvastatin 40 MG tablet Commonly known as: ZOCOR Take 1 tablet (40 mg total) by mouth every evening.   tamsulosin 0.4 MG Caps capsule Commonly known as: FLOMAX Take 2 capsules (0.8 mg total) by mouth daily.   vitamin C 1000 MG tablet Take 1,000 mg by mouth daily.   zinc sulfate 220 (50 Zn) MG capsule Take 1 tablet by mouth daily.               Durable Medical Equipment  (From admission, onward)           Start     Ordered   01/11/23 1146  For home use only DME Walker rolling  Once        Question Answer Comment  Walker: With 5 Inch Wheels   Patient needs a walker to treat with the following condition Weakness generalized   Patient needs a walker to treat with the following condition Balance disorder      01/11/23 1146   01/11/23 1025  For home use only DME Bedside commode  Once       Question:  Patient needs a bedside commode to treat with the following condition  Answer:  Generalized weakness   01/11/23 1024            Follow-up Information     Idelle Crouch, MD. Go in 1 week(s).   Specialty: Internal Medicine Why: Appointment on Monday, 01/14/2023 at 11:00am. Contact information: [facility] [place] [address] [phone]         Lin Landsman, MD. Schedule an appointment as soon as possible for a visit in 1 month(s).   Specialty: Gastroenterology Why: Dr. Verlin Grills nurse will call you directly to schedule this appointment. If you have not heard from them within 48 hours, please call them at the number listed. Contact information: [place] [address] [phone]                Allergies  Allergen Reactions   Penicillins Rash    Rash    Rash    Gabapentin    Penicillin G    Codeine Nausea Only and Nausea And Vomiting    Other reaction(s): Vomiting Other reaction(s): Vomiting    Consultations: Gastroenterology   Procedures/Studies: DG Chest Portable 1 View  Result Date: 01/08/2023 CLINICAL DATA:  Altered mental EXAM: PORTABLE CHEST 1 VIEW COMPARISON:  01/06/2023 FINDINGS: Heart and mediastinal contours are within normal limits. No focal opacities or effusions. No acute bony abnormality. Loop recorder device projects over the left chest. Aortic atherosclerosis. IMPRESSION: No active disease. Electronically Signed   By: Rolm Baptise M.D.   On: 01/08/2023 02:05   CT Cervical Spine Wo Contrast  Result Date: 01/08/2023 CLINICAL DATA:  Status post fall. EXAM: CT CERVICAL  SPINE WITHOUT CONTRAST TECHNIQUE: Multidetector CT imaging of the cervical spine was performed without intravenous contrast. Multiplanar CT image reconstructions were also generated. RADIATION DOSE REDUCTION: This exam was performed according to the departmental  dose-optimization program which includes automated exposure control, adjustment of the mA and/or kV according to patient size and/or use of iterative reconstruction technique. COMPARISON:  January 06, 2023 FINDINGS: Alignment: Normal. Skull base and vertebrae: No acute fracture. No primary bone lesion or focal pathologic process. Soft tissues and spinal canal: No prevertebral fluid or swelling. No visible canal hematoma. Disc levels: Marked severity endplate sclerosis is seen throughout the cervical spine, most prominent at the levels of C4-C5, C5-C6, C6-C7 and C7-T1. Moderate severity anterior osteophyte formation and posterior bony spurring are also seen at the levels of C4-C5, C5-C6 and C6-C7. There is marked severity intervertebral disc space narrowing seen at C4-C5, C5-C6, C6-C7 and C7-T1. Bilateral marked severity multilevel facet joint hypertrophy is noted. Upper chest: Negative. Other: None. IMPRESSION: 1. No acute fracture or subluxation in the cervical spine. 2. Marked severity multilevel degenerative changes. Electronically Signed   By: Virgina Norfolk M.D.   On: 01/08/2023 01:30   CT HEAD WO CONTRAST (5MM)  Result Date: 01/08/2023 CLINICAL DATA:  Status post fall. EXAM: CT HEAD WITHOUT CONTRAST TECHNIQUE: Contiguous axial images were obtained from the base of the skull through the vertex without intravenous contrast. RADIATION DOSE REDUCTION: This exam was performed according to the departmental dose-optimization program which includes automated exposure control, adjustment of the mA and/or kV according to patient size and/or use of iterative reconstruction technique. COMPARISON:  January 06, 2023 FINDINGS: Brain: There is moderate severity  cerebral atrophy with widening of the extra-axial spaces and ventricular dilatation. There are areas of decreased attenuation within the white matter tracts of the supratentorial brain, consistent with microvascular disease changes. A chronic left posterior parietal infarct is seen. Vascular: There is marked severity calcification of the bilateral cavernous carotid arteries. Skull: Normal. Negative for fracture or focal lesion. Sinuses/Orbits: No hyperdense vessel or unexpected calcification. Other: None. IMPRESSION: 1. No acute intracranial abnormality. 2. Chronic left posterior parietal infarct. 3. Cerebral atrophy and microvascular disease changes of the supratentorial brain. Electronically Signed   By: Virgina Norfolk M.D.   On: 01/08/2023 01:28   CT ABDOMEN PELVIS W CONTRAST  Result Date: 01/06/2023 CLINICAL DATA:  Fall EXAM: CT ABDOMEN AND PELVIS WITH CONTRAST TECHNIQUE: Multidetector CT imaging of the abdomen and pelvis was performed using the standard protocol following bolus administration of intravenous contrast. RADIATION DOSE REDUCTION: This exam was performed according to the departmental dose-optimization program which includes automated exposure control, adjustment of the mA and/or kV according to patient size and/or use of iterative reconstruction technique. CONTRAST:  126m OMNIPAQUE IOHEXOL 300 MG/ML  SOLN COMPARISON:  None Available. FINDINGS: Lower chest: No acute abnormality.  Coronary artery calcifications. Hepatobiliary: No focal liver abnormality is seen. No gallstones, gallbladder wall thickening, or biliary dilatation. Pancreas: Unremarkable. No pancreatic ductal dilatation or surrounding inflammatory changes. Spleen: No splenic injury or perisplenic hematoma. Adrenals/Urinary Tract: Adrenal glands are unremarkable. Kidneys are normal, without renal calculi, worrisome lesion, or hydronephrosis. Bladder is unremarkable. Stomach/Bowel: Stomach is within normal limits. Appendix not  visualized. No evidence of bowel wall thickening, distention, or inflammatory changes. Vascular/Lymphatic: Aortic atherosclerosis. No enlarged abdominal or pelvic lymph nodes. Reproductive: Prostate is unremarkable. Other: No abdominal wall hernia or abnormality. No abdominopelvic ascites. Musculoskeletal: Multilevel degenerative disc disease. No acute abnormality. IMPRESSION: 1. No acute traumatic findings in the abdomen or pelvis. 2. Coronary artery calcifications. Aortic Atherosclerosis (ICD10-I70.0). Electronically Signed   By: KUlyses JarredM.D.   On: 01/06/2023 02:58   CT HEAD WO CONTRAST (5MM)  Result Date: 01/06/2023  CLINICAL DATA:  Head trauma EXAM: CT HEAD WITHOUT CONTRAST CT CERVICAL SPINE WITHOUT CONTRAST TECHNIQUE: Multidetector CT imaging of the head and cervical spine was performed following the standard protocol without intravenous contrast. Multiplanar CT image reconstructions of the cervical spine were also generated. RADIATION DOSE REDUCTION: This exam was performed according to the departmental dose-optimization program which includes automated exposure control, adjustment of the mA and/or kV according to patient size and/or use of iterative reconstruction technique. COMPARISON:  None Available. FINDINGS: CT HEAD FINDINGS Brain: There is no mass, hemorrhage or extra-axial collection. There is generalized atrophy without lobar predilection. Old left parietotemporal infarct. There is periventricular hypoattenuation compatible with chronic microvascular disease. Vascular: Atherosclerotic calcification of the internal carotid arteries at the skull base. No abnormal hyperdensity of the major intracranial arteries or dural venous sinuses. Skull: The visualized skull base, calvarium and extracranial soft tissues are normal. Sinuses/Orbits: No fluid levels or advanced mucosal thickening of the visualized paranasal sinuses. No mastoid or middle ear effusion. The orbits are normal. CT CERVICAL SPINE  FINDINGS Alignment: No static subluxation. Facets are aligned. Occipital condyles are normally positioned. Skull base and vertebrae: No acute fracture. Soft tissues and spinal canal: No prevertebral fluid or swelling. No visible canal hematoma. Disc levels: Multilevel degenerative disc disease and facet arthrosis. Foraminal stenosis is greatest at the C5 and C6 neural foramina. Upper chest: No pneumothorax, pulmonary nodule or pleural effusion. Other: Normal visualized paraspinal cervical soft tissues. IMPRESSION: 1. No acute intracranial abnormality. 2. 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 3. No acute fracture or static subluxation of the cervical spine. Electronically Signed   By: Ulyses Jarred M.D.   On: 01/06/2023 02:38   CT Cervical Spine Wo Contrast  Result Date: 01/06/2023 CLINICAL DATA:  Head trauma EXAM: CT HEAD WITHOUT CONTRAST CT CERVICAL SPINE WITHOUT CONTRAST TECHNIQUE: Multidetector CT imaging of the head and cervical spine was performed following the standard protocol without intravenous contrast. Multiplanar CT image reconstructions of the cervical spine were also generated. RADIATION DOSE REDUCTION: This exam was performed according to the departmental dose-optimization program which includes automated exposure control, adjustment of the mA and/or kV according to patient size and/or use of iterative reconstruction technique. COMPARISON:  None Available. FINDINGS: CT HEAD FINDINGS Brain: There is no mass, hemorrhage or extra-axial collection. There is generalized atrophy without lobar predilection. Old left parietotemporal infarct. There is periventricular hypoattenuation compatible with chronic microvascular disease. Vascular: Atherosclerotic calcification of the internal carotid arteries at the skull base. No abnormal hyperdensity of the major intracranial arteries or dural venous sinuses. Skull: The visualized skull base, calvarium and extracranial soft tissues  are normal. Sinuses/Orbits: No fluid levels or advanced mucosal thickening of the visualized paranasal sinuses. No mastoid or middle ear effusion. The orbits are normal. CT CERVICAL SPINE FINDINGS Alignment: No static subluxation. Facets are aligned. Occipital condyles are normally positioned. Skull base and vertebrae: No acute fracture. Soft tissues and spinal canal: No prevertebral fluid or swelling. No visible canal hematoma. Disc levels: Multilevel degenerative disc disease and facet arthrosis. Foraminal stenosis is greatest at the C5 and C6 neural foramina. Upper chest: No pneumothorax, pulmonary nodule or pleural effusion. Other: Normal visualized paraspinal cervical soft tissues. IMPRESSION: 1. No acute intracranial abnormality. 2. 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 3. No acute fracture or static subluxation of the cervical spine. Electronically Signed   By: Ulyses Jarred M.D.   On: 01/06/2023 02:38   DG Chest Portable 1 View  Result Date: 01/06/2023  CLINICAL DATA:  Evaluate for pneumonia. Patient is felt sickle day. Nausea and vomiting. Dizziness with subsequent fall. EXAM: PORTABLE CHEST 1 VIEW COMPARISON:  09/13/2012 FINDINGS: A loop recorder is present. Heart size and pulmonary vascularity are normal. Lungs are clear. No pleural effusions. No pneumothorax. Mediastinal contours appear intact. Calcification and torsion of the aorta. IMPRESSION: No active disease. Electronically Signed   By: Lucienne Capers M.D.   On: 01/06/2023 01:57    S/p EGD.   Subjective: Patient was seen and examined at bedside.  Overnight events noted. Patient reports doing much better, denies any further bleeding.  He wants to be discharged.   Patient being discharged home.  Discharge Exam: Vitals:   01/11/23 0900 01/11/23 1000  BP:    Pulse:    Resp: (!) 25 19  Temp:    SpO2:     Vitals:   01/11/23 0841 01/11/23 0858 01/11/23 0900 01/11/23 1000  BP: (!) 128/55     Pulse: (!)  59     Resp: 20 18 (!) 25 19  Temp: 98.2 F (36.8 C)     TempSrc: Oral     SpO2: 99%     Weight: 80.3 kg     Height:        General: Pt is alert, awake, not in acute distress Cardiovascular: RRR, S1/S2 +, no rubs, no gallops Respiratory: CTA bilaterally, no wheezing, no rhonchi Abdominal: Soft, NT, ND, bowel sounds + Extremities: no edema, no cyanosis    The results of significant diagnostics from this hospitalization (including imaging, microbiology, ancillary and laboratory) are listed below for reference.     Microbiology: Recent Results (from the past 240 hour(s))  Resp panel by RT-PCR (RSV, Flu A&B, Covid) Anterior Nasal Swab     Status: None   Collection Time: 01/06/23  2:46 AM   Specimen: Anterior Nasal Swab  Result Value Ref Range Status   SARS Coronavirus 2 by RT PCR NEGATIVE NEGATIVE Final    Comment: (NOTE) SARS-CoV-2 target nucleic acids are NOT DETECTED.  The SARS-CoV-2 RNA is generally detectable in upper respiratory specimens during the acute phase of infection. The lowest concentration of SARS-CoV-2 viral copies this assay can detect is 138 copies/mL. A negative result does not preclude SARS-Cov-2 infection and should not be used as the sole basis for treatment or other patient management decisions. A negative result may occur with  improper specimen collection/handling, submission of specimen other than nasopharyngeal swab, presence of viral mutation(s) within the areas targeted by this assay, and inadequate number of viral copies(<138 copies/mL). A negative result must be combined with clinical observations, patient history, and epidemiological information. The expected result is Negative.  Fact Sheet for Patients:  EntrepreneurPulse.com.au  Fact Sheet for Healthcare Providers:  IncredibleEmployment.be  This test is no t yet approved or cleared by the [place] FDA and  has been authorized for detection and/or  diagnosis of SARS-CoV-2 by FDA under an Emergency Use Authorization (EUA). This EUA will remain  in effect (meaning this test can be used) for the duration of the COVID-19 declaration under Section 564(b)(1) of the Act, 21 U.S.C.section 360bbb-3(b)(1), unless the authorization is terminated  or revoked sooner.       Influenza A by PCR NEGATIVE NEGATIVE Final   Influenza B by PCR NEGATIVE NEGATIVE Final    Comment: (NOTE) The Xpert Xpress SARS-CoV-2/FLU/RSV plus assay is intended as an aid in the diagnosis of influenza from Nasopharyngeal swab specimens and should not be used as a  sole basis for treatment. Nasal washings and aspirates are unacceptable for Xpert Xpress SARS-CoV-2/FLU/RSV testing.  Fact Sheet for Patients: EntrepreneurPulse.com.au  Fact Sheet for Healthcare Providers: IncredibleEmployment.be  This test is not yet approved or cleared by the [place] FDA and has been authorized for detection and/or diagnosis of SARS-CoV-2 by FDA under an Emergency Use Authorization (EUA). This EUA will remain in effect (meaning this test can be used) for the duration of the COVID-19 declaration under Section 564(b)(1) of the Act, 21 U.S.C. section 360bbb-3(b)(1), unless the authorization is terminated or revoked.     Resp Syncytial Virus by PCR NEGATIVE NEGATIVE Final    Comment: (NOTE) Fact Sheet for Patients: EntrepreneurPulse.com.au  Fact Sheet for Healthcare Providers: IncredibleEmployment.be  This test is not yet approved or cleared by the [place] FDA and has been authorized for detection and/or diagnosis of SARS-CoV-2 by FDA under an Emergency Use Authorization (EUA). This EUA will remain in effect (meaning this test can be used) for the duration of the COVID-19 declaration under Section 564(b)(1) of the Act, 21 U.S.C. section 360bbb-3(b)(1), unless the authorization is terminated  or revoked.  Performed at [facility], [address] [place], [place] [address]   Blood culture (routine x 2)     Status: None (Preliminary result)   Collection Time: 01/08/23 12:27 AM   Specimen: BLOOD  Result Value Ref Range Status   Specimen Description BLOOD  RIGHT FOREARM  Final   Special Requests   Final    BOTTLES DRAWN AEROBIC AND ANAEROBIC Blood Culture adequate volume   Culture   Final    NO GROWTH 3 DAYS Performed at [facility], [address], [place], [place] [address]    Report Status PENDING  Incomplete  Blood culture (routine x 2)     Status: None (Preliminary result)   Collection Time: 01/08/23 12:27 AM   Specimen: BLOOD  Result Value Ref Range Status   Specimen Description BLOOD  LEFT FOREARM  Final   Special Requests   Final    BOTTLES DRAWN AEROBIC AND ANAEROBIC Blood Culture adequate volume   Culture   Final    NO GROWTH 3 DAYS Performed at [facility], [address], [place], [place] [address]    Report Status PENDING  Incomplete     Labs: BNP (last 3 results) No results for input(s): "BNP" in the last 8760 hours. Basic Metabolic Panel: Recent Labs  Lab 01/06/23 0118 01/08/23 0027 01/09/23 0515  NA 132* 135 139  K 4.3 4.1 3.9  CL 102 102 113*  CO2 20* 24 22  GLUCOSE 141* 166* 131*  BUN 62* 59* 39*  CREATININE 1.11 1.19 1.15  CALCIUM 8.4* 8.2* 8.0*  MG  --   --  1.9   Liver Function Tests: Recent Labs  Lab 01/06/23 0118 01/08/23 0027 01/09/23 0515  AST 22 23 21  $ ALT 23 18 16  $ ALKPHOS 66 55 42  BILITOT 1.8* 0.9 1.4*  PROT 5.9* 4.9* 4.8*  ALBUMIN 3.7 3.0* 3.1*   Recent Labs  Lab 01/06/23 0118 01/08/23 0027  LIPASE 41 56*   No results for input(s): "AMMONIA" in the last 168 hours. CBC: Recent Labs  Lab 01/06/23 0118 01/08/23 0027 01/08/23 0843 01/09/23 0515 01/09/23 1837 01/10/23 0222 01/10/23 1018  WBC 10.4 11.5*  --  9.8 8.8 9.2  --   NEUTROABS  --  6.2  --  6.5  --   --   --    HGB  11.6* 7.4* 8.0* 7.1* 7.5* 7.4* 8.0*  HCT 33.9* 22.3*  --  20.8* 22.1* 20.9* 23.6*  MCV 93.1 96.1  --  94.5 93.6 93.7  --   PLT 127* 133*  --  104* 94* 89*  --    Cardiac Enzymes: No results for input(s): "CKTOTAL", "CKMB", "CKMBINDEX", "TROPONINI" in the last 168 hours. BNP: Invalid input(s): "POCBNP" CBG: No results for input(s): "GLUCAP" in the last 168 hours. D-Dimer No results for input(s): "DDIMER" in the last 72 hours. Hgb A1c No results for input(s): "HGBA1C" in the last 72 hours. Lipid Profile No results for input(s): "CHOL", "HDL", "LDLCALC", "TRIG", "CHOLHDL", "LDLDIRECT" in the last 72 hours. Thyroid function studies Recent Labs    01/09/23 0515  T3FREE 2.6   Anemia work up Recent Labs    01/09/23 0515  VITAMINB12 396  FOLATE 35.0  FERRITIN 72  TIBC 258  IRON 56   Urinalysis    Component Value Date/Time   COLORURINE YELLOW (A) 01/08/2023 0027   APPEARANCEUR CLEAR (A) 01/08/2023 0027   APPEARANCEUR Clear 05/29/2018 1049   LABSPEC 1.017 01/08/2023 0027   LABSPEC 1.011 09/13/2012 1609   PHURINE 5.0 01/08/2023 0027   GLUCOSEU NEGATIVE 01/08/2023 0027   GLUCOSEU Negative 09/13/2012 1609   HGBUR NEGATIVE 01/08/2023 0027   BILIRUBINUR NEGATIVE 01/08/2023 0027   BILIRUBINUR Negative 05/29/2018 1049   BILIRUBINUR Negative 09/13/2012 1609   KETONESUR 5 (A) 01/08/2023 0027   PROTEINUR NEGATIVE 01/08/2023 0027   NITRITE NEGATIVE 01/08/2023 0027   LEUKOCYTESUR NEGATIVE 01/08/2023 0027   LEUKOCYTESUR Negative 09/13/2012 1609   Sepsis Labs Recent Labs  Lab 01/08/23 0027 01/09/23 0515 01/09/23 1837 01/10/23 0222  WBC 11.5* 9.8 8.8 9.2   Microbiology Recent Results (from the past 240 hour(s))  Resp panel by RT-PCR (RSV, Flu A&B, Covid) Anterior Nasal Swab     Status: None   Collection Time: 01/06/23  2:46 AM   Specimen: Anterior Nasal Swab  Result Value Ref Range Status   SARS Coronavirus 2 by RT PCR NEGATIVE NEGATIVE Final    Comment:  (NOTE) SARS-CoV-2 target nucleic acids are NOT DETECTED.  The SARS-CoV-2 RNA is generally detectable in upper respiratory specimens during the acute phase of infection. The lowest concentration of SARS-CoV-2 viral copies this assay can detect is 138 copies/mL. A negative result does not preclude SARS-Cov-2 infection and should not be used as the sole basis for treatment or other patient management decisions. A negative result may occur with  improper specimen collection/handling, submission of specimen other than nasopharyngeal swab, presence of viral mutation(s) within the areas targeted by this assay, and inadequate number of viral copies(<138 copies/mL). A negative result must be combined with clinical observations, patient history, and epidemiological information. The expected result is Negative.  Fact Sheet for Patients:  EntrepreneurPulse.com.au  Fact Sheet for Healthcare Providers:  IncredibleEmployment.be  This test is no t yet approved or cleared by the [place] FDA and  has been authorized for detection and/or diagnosis of SARS-CoV-2 by FDA under an Emergency Use Authorization (EUA). This EUA will remain  in effect (meaning this test can be used) for the duration of the COVID-19 declaration under Section 564(b)(1) of the Act, 21 U.S.C.section 360bbb-3(b)(1), unless the authorization is terminated  or revoked sooner.       Influenza A by PCR NEGATIVE NEGATIVE Final   Influenza B by PCR NEGATIVE NEGATIVE Final    Comment: (NOTE) The Xpert Xpress SARS-CoV-2/FLU/RSV plus assay is intended as an aid in the  diagnosis of influenza from Nasopharyngeal swab specimens and should not be used as a sole basis for treatment. Nasal washings and aspirates are unacceptable for Xpert Xpress SARS-CoV-2/FLU/RSV testing.  Fact Sheet for Patients: EntrepreneurPulse.com.au  Fact Sheet for Healthcare  Providers: IncredibleEmployment.be  This test is not yet approved or cleared by the [place] FDA and has been authorized for detection and/or diagnosis of SARS-CoV-2 by FDA under an Emergency Use Authorization (EUA). This EUA will remain in effect (meaning this test can be used) for the duration of the COVID-19 declaration under Section 564(b)(1) of the Act, 21 U.S.C. section 360bbb-3(b)(1), unless the authorization is terminated or revoked.     Resp Syncytial Virus by PCR NEGATIVE NEGATIVE Final    Comment: (NOTE) Fact Sheet for Patients: EntrepreneurPulse.com.au  Fact Sheet for Healthcare Providers: IncredibleEmployment.be  This test is not yet approved or cleared by the [place] FDA and has been authorized for detection and/or diagnosis of SARS-CoV-2 by FDA under an Emergency Use Authorization (EUA). This EUA will remain in effect (meaning this test can be used) for the duration of the COVID-19 declaration under Section 564(b)(1) of the Act, 21 U.S.C. section 360bbb-3(b)(1), unless the authorization is terminated or revoked.  Performed at [facility], [address] [place], [place] [address]   Blood culture (routine x 2)     Status: None (Preliminary result)   Collection Time: 01/08/23 12:27 AM   Specimen: BLOOD  Result Value Ref Range Status   Specimen Description BLOOD  RIGHT FOREARM  Final   Special Requests   Final    BOTTLES DRAWN AEROBIC AND ANAEROBIC Blood Culture adequate volume   Culture   Final    NO GROWTH 3 DAYS Performed at [facility], [address] [place], [place] [address]    Report Status PENDING  Incomplete  Blood culture (routine x 2)     Status: None (Preliminary result)   Collection Time: 01/08/23 12:27 AM   Specimen: BLOOD  Result Value Ref Range Status   Specimen Description BLOOD  LEFT FOREARM  Final   Special Requests   Final    BOTTLES DRAWN AEROBIC  AND ANAEROBIC Blood Culture adequate volume   Culture   Final    NO GROWTH 3 DAYS Performed at [facility], [address], [place], [place] [address]    Report Status PENDING  Incomplete     Time coordinating discharge: Over 30 minutes  SIGNED:   Shawna Clamp, MD  [facility] 01/11/2023, 12:46 PM Pager   If 7PM-7AM, please contact night-coverage

## 2023-01-11 NOTE — Evaluation (Signed)
Occupational Therapy Evaluation Patient Details Name: [name]. MRN: [id number] DOB: 1932-12-17 Today's Date: 01/11/2023   History of Present Illness Pt is an 89 year old male admitted with acute blood loss anemia, new onset of afib after presenting to the ED with vomiting and diarrhea for the second time in three days; PMH significant for CAD, CVA 2018, HTN, HLD, essential tremor followed by neurology, bradycardia (beta-blocker intolerant)   Clinical Impression   Chart reviewed, pt in hallway amb with Rw with tech at start of evaluation. Pt is alert and oriented x4, baseline mild expressive difficulties from previous stroke. PTA pt is generally MOD I in ADL/IADL, amb with no AD. Pt has fallen two times in the last month due to acute illness. Pt presents with deficit in strength, endurance, activity tolerance, balance affecting safe and optimal ADL completion. Recommend HHOT following discharge. OT will continue to follow acutely.      Recommendations for follow up therapy are one component of a multi-disciplinary discharge planning process, led by the attending physician.  Recommendations may be updated based on patient status, additional functional criteria and insurance authorization.   Follow Up Recommendations  Home health OT     Assistance Recommended at Discharge Frequent or constant Supervision/Assistance  Patient can return home with the following A little help with walking and/or transfers;A little help with bathing/dressing/bathroom    Functional Status Assessment  Patient has had a recent decline in their functional status and demonstrates the ability to make significant improvements in function in a reasonable and predictable amount of time.  Equipment Recommendations  BSC/3in1 (2WW)    Recommendations for Other Services       Precautions / Restrictions Precautions Precautions: Fall Restrictions Weight Bearing Restrictions: No      Mobility Bed  Mobility               General bed mobility comments: NT in recliner pre/post session    Transfers Overall transfer level: Needs assistance Equipment used: Rolling walker (2 wheels) Transfers: Sit to/from Stand Sit to Stand: Supervision                  Balance Overall balance assessment: Needs assistance Sitting-balance support: Feet supported Sitting balance-Leahy Scale: Good     Standing balance support: During functional activity, Bilateral upper extremity supported Standing balance-Leahy Scale: Fair                             ADL either performed or assessed with clinical judgement   ADL Overall ADL's : Needs assistance/impaired     Grooming: Wash/dry hands;Standing;Supervision/safety               Lower Body Dressing: Supervision/safety;Sitting/lateral leans Lower Body Dressing Details (indicate cue type and reason): shoes Toilet Transfer: Min guard;BSC/3in1;Rolling walker (2 wheels) Toilet Transfer Details (indicate cue type and reason): intermittent vcs for technique         Functional mobility during ADLs: Rolling walker (2 wheels);Supervision/safety;Min guard (approx 100')       Vision Patient Visual Report: No change from baseline       Perception     Praxis      Pertinent Vitals/Pain Pain Assessment Pain Assessment: No/denies pain     Hand Dominance  (ambidextrous)   Extremity/Trunk Assessment Upper Extremity Assessment Upper Extremity Assessment: Generalized weakness   Lower Extremity Assessment Lower Extremity Assessment: Generalized weakness       Communication Communication Communication:  (  baseline mild expressive difficulties from previous stroke)   Cognition Arousal/Alertness: Awake/alert Behavior During Therapy: WFL for tasks assessed/performed Overall Cognitive Status: Within Functional Limits for tasks assessed                                       General Comments  vital  signs appear stable throughout    Exercises Other Exercises Other Exercises: edu pt and family re: role of OT, role of rehab, discharge recommendations, DME use, home set up   Shoulder Instructions      Home Living Family/patient expects to be discharged to:: Private residence Living Arrangements: Spouse/significant other Available Help at Discharge: Family;Available 24 hours/day (home health aid coming for a few days after discharge- new assist) Type of Home: House Home Access: Stairs to enter [employer] of Steps: 1 step through the garage Entrance Stairs-Rails: Left Home Layout: Multi-level;Able to live on main level with bedroom/bathroom Alternate Level Stairs-Number of Steps: flight of stairs- no need to go upstairs   Bathroom Shower/Tub: Occupational psychologist: Handicapped height Bathroom Accessibility: Yes How Accessible: Accessible via walker Home Equipment: Grab bars - tub/shower;Grab bars - toilet;Cane - single point          Prior Functioning/Environment Prior Level of Function : Independent/Modified Independent             Mobility Comments: amb with no AD, ambulates up to 2 miles/day prior to admission ADLs Comments: MOD I in ADL, cooks, drives short distances        OT Problem List: Decreased strength;Impaired balance (sitting and/or standing);Decreased activity tolerance      OT Treatment/Interventions: Self-care/ADL training;DME and/or AE instruction;Therapeutic exercise;Therapeutic activities;Patient/family education    OT Goals(Current goals can be found in the care plan section) Acute Rehab OT Goals Patient Stated Goal: go home OT Goal Formulation: With patient/family Time For Goal Achievement: 01/25/23 Potential to Achieve Goals: Good  OT Frequency: Min 2X/week    Co-evaluation              AM-PAC OT "6 Clicks" Daily Activity     Outcome Measure Help from another person eating meals?: None Help from another  person taking care of personal grooming?: None Help from another person toileting, which includes using toliet, bedpan, or urinal?: A Little Help from another person bathing (including washing, rinsing, drying)?: A Little Help from another person to put on and taking off regular upper body clothing?: None Help from another person to put on and taking off regular lower body clothing?: A Little 6 Click Score: 21   End of Session Equipment Utilized During Treatment: Rolling walker (2 wheels) Nurse Communication: Mobility status  Activity Tolerance: Patient tolerated treatment well Patient left: in chair;with call bell/phone within reach;with family/visitor present (PT present)  OT Visit Diagnosis: Unsteadiness on feet (R26.81);Muscle weakness (generalized) (M62.81);History of falling (Z91.81)                Time: [id number] OT Time Calculation (min): 12 min Charges:  OT General Charges $OT Visit: 1 Visit OT Evaluation $OT Eval Low Complexity: 1 Low  Shanon Payor, OTD OTR/L  01/11/23, 1:34 PM

## 2023-01-13 LAB — CULTURE, BLOOD (ROUTINE X 2)
Culture: NO GROWTH
Culture: NO GROWTH
Special Requests: ADEQUATE
Special Requests: ADEQUATE

## 2023-01-31 ENCOUNTER — Encounter: Payer: Self-pay | Admitting: Podiatry

## 2023-01-31 ENCOUNTER — Ambulatory Visit (INDEPENDENT_AMBULATORY_CARE_PROVIDER_SITE_OTHER): Payer: Medicare Other | Admitting: Podiatry

## 2023-01-31 VITALS — BP 121/61 | HR 59

## 2023-01-31 DIAGNOSIS — B351 Tinea unguium: Secondary | ICD-10-CM

## 2023-01-31 DIAGNOSIS — M79676 Pain in unspecified toe(s): Secondary | ICD-10-CM | POA: Diagnosis not present

## 2023-01-31 DIAGNOSIS — F0390 Unspecified dementia without behavioral disturbance: Secondary | ICD-10-CM

## 2023-01-31 NOTE — Progress Notes (Signed)
This patient returns to my office for at risk foot care.  This patient requires this care by a professional since this patient will be at risk due to having dementia and CVA.  Patient has healing of digital ulcer left foot.  This patient is unable to cut nails himself since the patient cannot reach his nails.These nails are painful walking and wearing shoes. He presents to the office with his wife.This patient presents for at risk foot care today.  General Appearance  Alert, conversant and in no acute stress.  Vascular  Dorsalis pedis and posterior tibial  pulses are palpable  bilaterally.  Capillary return is within normal limits  bilaterally. Temperature is within normal limits  bilaterally.  Neurologic  Senn-Weinstein monofilament wire test within normal limits  bilaterally. Muscle power within normal limits bilaterally.  Nails Thick disfigured discolored nails with subungual debris  from hallux to fifth toes bilaterally. No evidence of bacterial infection or drainage bilaterally.  Orthopedic  No limitations of motion  feet .  No crepitus or effusions noted.  No bony pathology or digital deformities noted.  Skin  normotropic skin with no porokeratosis noted bilaterally.  No signs of infections or ulcers noted.     Onychomycosis  Pain in right toes  Pain in left toes  Consent was obtained for treatment procedures.   Mechanical debridement of nails 1-5  bilaterally performed with a nail nipper.  Filed with dremel without incident.    Return office visit   3 months                  Told patient to return for periodic foot care and evaluation due to potential at risk complications.   Gardiner Barefoot DPM

## 2023-03-04 ENCOUNTER — Telehealth: Payer: Self-pay | Admitting: Cardiovascular Disease

## 2023-03-04 ENCOUNTER — Ambulatory Visit: Payer: Medicare Other | Attending: Cardiovascular Disease

## 2023-03-04 DIAGNOSIS — R002 Palpitations: Secondary | ICD-10-CM

## 2023-03-04 NOTE — Telephone Encounter (Signed)
Patients spouse sent these messages through her my chart and not his.    All Conversations: Carles Collet concerning Hermenegildo Tamminga   (Newest Message First) March 04, 2023 Me  to TAVIEN RICARDEZ      03/04/23 11:55 AM Since this is for your husband it will need to be in his chart and not yours. I will try to copy all of this information into his chart. In the future if it is for him it would need to be sent under his my chart and not yours. Could you verify his date of birth so I make sure it goes to the right person?    Thanks  This MyChart message has not been read. Zenia Resides Cantero  to [place] [facility] (supporting Minna Merritts, MD)      03/04/23  7:56 AM Is this something he wears ?  Will instructions come with this?  If Dr Rockey Situ thinks this is what he should do please send or if I can pick up please have someone call me and I  can pick up.  Temur Schu [id number] Ricci Barker, RN  to RAJAT BAGENT      03/04/23  7:50 AM Good morning,   Per Dr. Rockey Situ,   If there is concern he might need a pacemaker from primary care, would start with a Zio monitor We can send that in the mail, arrange follow-up in clinic after results are available Thx TGollan      Let me know and we can get the monitor ordered. Lattie Haw  Last read by Heywood Iles at  8:02 AM on 03/04/2023. March 01, 2023 Minna Merritts, MD  to [facility] T[facility]     03/01/23  6:16 PM If there is concern he might need a pacemaker from primary care, would start with a Zio monitor We can send that in the mail, arrange follow-up in clinic after results are available Thx Gwenyth Bouillon, Netta Corrigan, RN  to Minna Merritts, MD     03/01/23  4:09 PM Can add him to a DOD slot if you want? Zenia Resides Mcclenton  to [place] [facility] (supporting Minna Merritts, MD)      03/01/23  4:00 PM Yes his energy level .  He is exhausted after a shower, or a short walk . He had a bleeding ulcer some weeks ago and lost a  lot of blood.  Was in hospital and had blood transfusions. He is doing much better now.    His levels were down to 7 at that time but as of yesterday are back to 11.3.  He just get so exhausted for doing any little thing. He says his heart feels like it is beating fast at times.   We only want to see Dr Candis Musa to see what he thinks is going on and if a pace maker is something he needs.  Mallie Mussel, RN  to DIKEMBE ULLAND      03/01/23  3:33 PM Hello,   Was there something specific that happened to make him think that he does need a pacemaker?    Thank you, Lattie Haw  Last read by Heywood Iles at  8:02 AM on 03/04/2023. Zenia Resides Bown  to [place] [facility] (supporting Minna Merritts, MD)      03/01/23  9:10 AM Arty Baumgartner, I am Herschel [place]  wife Timo Drust and  am sending this message as he does not know how to use my chart. He has an appointment on May 21 at 9:00 with you.  Dr Doy Hutching feels he might need pace maker and wanted him to see you concerning this and your evaluation.   Can you work him into your schedule before May 21?  If so that would be great.  Please respond to this message or have your office call  me at [phone] if you can see him sooner. Thank you, Zamauri Shertzer BD [birth year]

## 2023-03-10 DIAGNOSIS — R002 Palpitations: Secondary | ICD-10-CM | POA: Diagnosis not present

## 2023-03-22 ENCOUNTER — Ambulatory Visit: Payer: Medicare Other | Admitting: Nurse Practitioner

## 2023-04-17 ENCOUNTER — Other Ambulatory Visit: Payer: Self-pay

## 2023-04-17 DIAGNOSIS — I495 Sick sinus syndrome: Secondary | ICD-10-CM

## 2023-04-21 NOTE — Progress Notes (Signed)
Cardiology Office Note  Date:  04/23/2023   ID:  Calvin Shorts Gabryl Mccamy., DOB 02/06/1933, MRN [id number]  PCP:  [name], MD   Chief Complaint  Patient presents with   Tachycardia-bradycardia     Dr. [name] ref for evaluation of new heart murmur and to review recent Echo results. Patient c/o more shortness of breath with walking. Medications reviewed by the patient verbally.     HPI:  [name] is a 89 year-old gentleman with a history of  coronary artery disease,   stent placed to his proximal RCA in September 2007, taxis stent 3.0 x 24 mm, previous syncopal episode (Etiology of his spell is uncertain),  50% carotid disease on the right CVA 2018, History of peripheral vision issues  who presents for routine followup of his coronary artery disease,  Tremor, and stroke New atrial fibrillation seen on today's visit  Last seen in clinic September 2023  Echo performed at [facility] NORMAL LEFT VENTRICULAR SYSTOLIC FUNCTION  NORMAL RIGHT VENTRICULAR SYSTOLIC FUNCTION  MODERATE MITRAL VALVE INSUFFICIENCY  MILD TRICUSPID AND AORTIC VALVE INSUFFICIENCY  MILD-TO-MODERATE AORTIC VALVE STENOSIS  GLS: -15.0 %   Aortic valve with mean gradient 24 mmHg indicating moderate stenosis  Prior echo March 2021 MR was mild Borderline mild aortic valve stenosis  Zio monitor from April 2024 Frequent episodes of tachycardia, frequent PACs 408 Supraventricular Tachycardia runs occurred, the run with the fastest interval lasting 5 beats with a max rate of 146 bpm, the longest lasting 25.4 secs with an avg rate of 110 bpm.  History of bradycardia  He has appreciated increased shortness of breath, trace ankle swelling  Lab work reviewed creatinine 1.4, trending upwards  EKG today confirming atrial fibrillation rate 62 bpm poor R wave pretty anterior precordial leads, left axis deviation, unable to exclude old inferior MI  past medical history reviewed history of left parieto-occipital  hemorrhagic stroke  seen by ophthamologist 3/22 who did HVF test which showed right inf>sup quandrantopsia. felt this was a new finding [name] denied any new visual field deficits.  He has been driving Seen in the Er 07/31/5620 Head MRI: nothing acute  01/16/2020,  ED for syncope 3 alcoholic beverages that night, which was more than his usual  woke around 2 AM, and after entering the kitchen, had a syncopal episode  --received his second COVID-19 vaccination 4 days prior. usually spends some time on the edge of the bed and seated before standing, in order to prevent dizziness  daughter reports the fall / LOC woke her from sleep She found him slumped over on the floor.  he was cold to the touch and unable to speak  blood pressure was low.  EMS took his BP when they arrived and noted it was low.  MRA was without acute abnormality and showed mild chronic small vessel ischemic dz and moderate cerebral atrophy. chronic hemorrhagic L temporoparietal infarct.    Echo 1. Left ventricular ejection fraction, by estimation, is 55%.   fall while in [place] 2019 Was watching a football game, stood up and had a mechanical fall tripped over some shoes 10/24/2018 Suffered a hip fracture , was hospitalized  Following the surgery the physicians increased his lisinopril up to 20 mg daily and placed him on aspirin 325 mg daily He has started to have some upset stomach, wonders if it could be the high-dose aspirin  February 2018 he had acute vision changes, word finding difficulty. He was taken by life flight to [facility]  hospital MRI/MRA showing acute stroke Details of scan as below: 1. Acute left parieto-occipital intraparenchymal hematoma with adjacent subarachnoid hemorrhage. This most likely represents hemorrhagic transformation from an embolic infarct given its configuration. No underlying mass. No other findings to suggest amyloid angiopathy. 2. Normal MRA of the head.  He had a CT scan of the neck  showing 50% stenosis on the left, 10% on the right  At the time of his stroke he was on aspirin and Plavix. Plavix was held  echocardiogram  NORMAL LEFT VENTRICULAR SYSTOLIC FUNCTION WITH MILD LVH NORMAL RIGHT VENTRICULAR SYSTOLIC FUNCTION VALVULAR REGURGITATION: TRIVIAL AR, TRIVIAL TR VALVULAR STENOSIS: TRIVIAL AS VERY POOR SOUND TRANSMISSION-DEFINITY CONTRAST USED NO PRIOR STUDY FOR COMPARISON    PMH:   has a past medical history of Acute embolic stroke (HCC) (02/01/2017), BPH (benign prostatic hyperplasia), Bradycardia (11/10/2015), Coronary artery disease, Dizziness (08/21/2012), DYSPEPSIA (07/31/2010), Hyperlipidemia (05/16/2010), Hypertension, HYPERTENSION, BENIGN (05/16/2010), and Stenosis of right carotid artery (02/01/2017).  PSH:    Past Surgical History:  Procedure Laterality Date   ESOPHAGOGASTRODUODENOSCOPY (EGD) WITH PROPOFOL N/A 01/08/2023   Procedure: ESOPHAGOGASTRODUODENOSCOPY (EGD) WITH PROPOFOL;  Surgeon: Midge Minium, MD;  Location: [facility] ENDOSCOPY;  Service: Endoscopy;  Laterality: N/A;   HIP SURGERY     right hip    Intestinal Blockage     LOOP RECORDER INSERTION N/A 05/14/2017   Procedure: Loop Recorder Insertion;  Surgeon: Duke Salvia, MD;  Location: [facility] INVASIVE CV LAB;  Service: Cardiovascular;  Laterality: N/A;   SKIN CANCER EXCISION     x2   TONSILLECTOMY      Current Outpatient Medications  Medication Sig Dispense Refill   acetaminophen (TYLENOL) 650 MG CR tablet Take 650 mg by mouth every 8 (eight) hours as needed.      Ascorbic Acid (VITAMIN C) 1000 MG tablet Take 1,000 mg by mouth daily.     aspirin 81 MG tablet Take 1 tablet (81 mg total) by mouth daily. 30 tablet    b complex vitamins tablet Take 1 tablet by mouth daily.     cholecalciferol (VITAMIN D) 1000 units tablet Take 2,000 Units by mouth daily.     cyanocobalamin (VITAMIN B12) 1000 MCG tablet Take 1,000 mcg by mouth daily.     docusate sodium (COLACE) 100 MG capsule Take 100 mg by mouth 2 (two)  times daily.     ezetimibe (ZETIA) 10 MG tablet Take 1 tablet (10 mg total) by mouth daily. 90 tablet 3   FLAXSEED, LINSEED, PO Take 2 g by mouth daily.     lisinopril (ZESTRIL) 10 MG tablet Take 10 mg by mouth daily.     nitroGLYCERIN (NITROSTAT) 0.4 MG SL tablet Place 1 tablet (0.4 mg total) under the tongue every 5 (five) minutes as needed for chest pain. 25 tablet 3   pantoprazole (PROTONIX) 40 MG tablet Take 1 tablet (40 mg total) by mouth 2 (two) times daily. 60 tablet 1   pregabalin (LYRICA) 100 MG capsule TAKE ONE CAPSULE BY MOUTH EVERY MORNING FOR DIABETIC COMPLICATION PERIPHERAL NEUROPATHY     pregabalin (LYRICA) 150 MG capsule TAKE ONE CAPSULE BY MOUTH AT BEDTIME FOR DIABETIC COMPLICATION NOTE: TAKING 100MG  DAILY AND 150MG  AT NIGHT.     Probiotic Product (UP4 PROBIOTICS PO) Take 7.5 mg by mouth.     simvastatin (ZOCOR) 40 MG tablet Take 1 tablet (40 mg total) by mouth every evening. 90 tablet 3   tamsulosin (FLOMAX) 0.4 MG CAPS capsule Take 2 capsules (0.8 mg total) by mouth  daily. 30 capsule 0   zinc sulfate 220 (50 Zn) MG capsule Take 1 tablet by mouth daily.     No current facility-administered medications for this visit.    Allergies:   Penicillins, Gabapentin, Penicillin g, and Codeine   Social History:  The patient  reports that he has never smoked. He has never used smokeless tobacco. He reports current alcohol use. He reports that he does not use drugs.   Family History:   family history includes Cancer in an other family member; Hyperlipidemia in an other family member; Hypertension in his mother and another family member; Other in an other family member.    Review of Systems: Review of Systems  Constitutional: Negative.   HENT: Negative.    Respiratory: Negative.    Cardiovascular: Negative.   Gastrointestinal: Negative.   Musculoskeletal:  Positive for joint pain.  Neurological:  Positive for tremors.  Psychiatric/Behavioral:  Positive for memory loss.   All  other systems reviewed and are negative.   PHYSICAL EXAM: VS:  BP 132/70 (BP Location: Left Arm, Patient Position: Sitting, Cuff Size: Normal)   Pulse 62   Ht 5\' 8"  (1.727 m)   Wt 182 lb 4 oz (82.7 kg)   SpO2 96%   BMI 27.71 kg/m  , BMI Body mass index is 27.71 kg/m. Constitutional:  oriented to person, place, and time. No distress.  HENT:  Head: Grossly normal Eyes:  no discharge. No scleral icterus.  Neck: No JVD, no carotid bruits  Cardiovascular: Irregularly irregular no murmurs appreciated Trace ankle swelling Pulmonary/Chest: Clear to auscultation bilaterally, no wheezes or rales Abdominal: Soft.  no distension.  no tenderness.  Musculoskeletal: Normal range of motion Neurological:  normal muscle tone. Coordination normal. No atrophy Skin: Skin warm and dry Psychiatric: normal affect, pleasant   Recent Labs: 01/08/2023: TSH 9.197 01/09/2023: ALT 16; BUN 39; Creatinine, Ser 1.15; Magnesium 1.9; Potassium 3.9; Sodium 139 01/10/2023: Hemoglobin 8.0; Platelets 89    Lipid Panel Lab Results  Component Value Date   CHOL 122 01/23/2010   HDL 33.8 01/23/2010   LDLCALC 69.6 01/23/2010   TRIG 93 01/23/2010    total chol  Wt Readings from Last 3 Encounters:  04/23/23 182 lb 4 oz (82.7 kg)  01/11/23 177 lb (80.3 kg)  01/06/23 185 lb (83.9 kg)     ASSESSMENT AND PLAN:  Atrial fibrillation, Unable to exclude persistent versus paroxysmal Recent Zio monitor with no significant atrial fibrillation noted Atrial fibrillation documented on EKG today Rate well-controlled on no controlling medications Recommend he start Eliquis 5 twice daily Does not meet criteria for reduced dose but will monitor renal function closely Slight increase in shortness of breath possibly from atrial fibrillation Will start Lasix 20 with potassium 10 every other day  Bradycardia Chronic history , we will avoid beta-blockers for now  Palpitations Zio monitor with short runs of narrow complex  tachycardia Atrial fibrillation noted today, plan as above  Hyperlipidemia, unspecified hyperlipidemia type - Cholesterol is at goal on the current lipid regimen. No changes to the medications were made.  HYPERTENSION, BENIGN - Plan: EKG 12-Lead Blood pressure is well controlled on today's visit. No changes made to the medications.  Atherosclerosis of native coronary artery of native heart with chronic stable angina pectoris - Currently with no symptoms of angina. No further workup at this time. Continue current medication regimen.  syncope, unspecified syncope type - Plan: EKG 12-Lead Remote syncope No recent episodes of near syncope or syncope, blood pressure stable  Zio monitor completed, no pauses  Acute embolic stroke [facility]) Previously wore loop monitor for 3 years with no significant arrhythmia Atrial fibrillation noted today  Stenosis of right carotid artery Nonobstructive disease, Cholesterol at goal  Essential tremor Followed by neurology Not able to tolerate beta-blockers given bradycardia   Total encounter time more than 40 minutes  Greater than 50% was spent in counseling and coordination of care with the patient     Orders Placed This Encounter  Procedures   EKG 12-Lead     Signed, [name] Arbour, M.D., Ph.D. 04/23/2023  [facility] [place], [place] [phone]

## 2023-04-23 ENCOUNTER — Encounter: Payer: Self-pay | Admitting: Cardiovascular Disease

## 2023-04-23 ENCOUNTER — Ambulatory Visit: Payer: Medicare Other | Attending: Nurse Practitioner | Admitting: Cardiovascular Disease

## 2023-04-23 VITALS — BP 132/70 | HR 62 | Ht 68.0 in | Wt 182.2 lb

## 2023-04-23 DIAGNOSIS — I10 Essential (primary) hypertension: Secondary | ICD-10-CM | POA: Diagnosis present

## 2023-04-23 DIAGNOSIS — I639 Cerebral infarction, unspecified: Secondary | ICD-10-CM

## 2023-04-23 DIAGNOSIS — Z79899 Other long term (current) drug therapy: Secondary | ICD-10-CM

## 2023-04-23 DIAGNOSIS — I25118 Atherosclerotic heart disease of native coronary artery with other forms of angina pectoris: Secondary | ICD-10-CM | POA: Diagnosis present

## 2023-04-23 DIAGNOSIS — E785 Hyperlipidemia, unspecified: Secondary | ICD-10-CM

## 2023-04-23 DIAGNOSIS — I495 Sick sinus syndrome: Secondary | ICD-10-CM

## 2023-04-23 DIAGNOSIS — R002 Palpitations: Secondary | ICD-10-CM

## 2023-04-23 MED ORDER — FUROSEMIDE 20 MG PO TABS: 20.0000 mg | ORAL_TABLET | ORAL | 3 refills | Status: DC

## 2023-04-23 MED ORDER — POTASSIUM CHLORIDE ER 10 MEQ PO TBCR: 10.0000 meq | EXTENDED_RELEASE_TABLET | ORAL | 3 refills | Status: DC

## 2023-04-23 MED ORDER — APIXABAN 5 MG PO TABS: 5.0000 mg | ORAL_TABLET | Freq: Two times a day (BID) | ORAL | 3 refills | Status: DC

## 2023-04-23 NOTE — Patient Instructions (Addendum)
Medication Instructions:  Please start eliquis 5 mg twice  a day Please start lasix 20 mg every other day with potassium 10 meq every other day   If you need a refill on your cardiac medications before your next appointment, please call your pharmacy.   Lab work: BMP and CBC at [facility] in two weeks  Testing/Procedures: No new testing needed  Follow-Up: At [facility], you and your health needs are our priority.  As part of our continuing mission to provide you with exceptional heart care, we have created designated Provider Care Teams.  These Care Teams include your primary Cardiologist (physician) and Advanced Practice Providers (APPs -  Physician Assistants and Nurse Practitioners) who all work together to provide you with the care you need, when you need it.  You will need a follow up appointment in 1 month  Providers on your designated Care Team:   Nicolasa Ducking, NP Eula Listen, PA-C Cadence Fransico Michael, [place]  COVID-19 Vaccine Information can be found at: [email or web address] For questions related to vaccine distribution or appointments, please email [email or web address] or call [phone].

## 2023-05-11 ENCOUNTER — Other Ambulatory Visit: Payer: Self-pay

## 2023-05-11 ENCOUNTER — Emergency Department
Admission: EM | Admit: 2023-05-11 | Discharge: 2023-05-11 | Disposition: A | Discharge status: Home / Self Care | Payer: Medicare Other | Attending: Emergency Medicine | Admitting: Emergency Medicine

## 2023-05-11 ENCOUNTER — Emergency Department: Payer: Medicare Other

## 2023-05-11 ENCOUNTER — Encounter: Payer: Self-pay | Admitting: Emergency Medicine

## 2023-05-11 DIAGNOSIS — I10 Essential (primary) hypertension: Secondary | ICD-10-CM | POA: Insufficient documentation

## 2023-05-11 DIAGNOSIS — Z7901 Long term (current) use of anticoagulants: Secondary | ICD-10-CM | POA: Insufficient documentation

## 2023-05-11 DIAGNOSIS — M542 Cervicalgia: Secondary | ICD-10-CM | POA: Diagnosis not present

## 2023-05-11 DIAGNOSIS — M791 Myalgia, unspecified site: Secondary | ICD-10-CM | POA: Insufficient documentation

## 2023-05-11 DIAGNOSIS — R519 Headache, unspecified: Secondary | ICD-10-CM | POA: Insufficient documentation

## 2023-05-11 DIAGNOSIS — I4891 Unspecified atrial fibrillation: Secondary | ICD-10-CM | POA: Diagnosis not present

## 2023-05-11 LAB — CBC WITH DIFFERENTIAL/PLATELET
Abs Immature Granulocytes: 0.01 10*3/uL (ref 0.00–0.07)
Basophils Absolute: 0.1 10*3/uL (ref 0.0–0.1)
Basophils Relative: 1 %
Eosinophils Absolute: 0.3 10*3/uL (ref 0.0–0.5)
Eosinophils Relative: 5 %
HCT: 42.3 % (ref 39.0–52.0)
Hemoglobin: 14.1 g/dL (ref 13.0–17.0)
Immature Granulocytes: 0 %
Lymphocytes Relative: 27 %
Lymphs Abs: 1.9 10*3/uL (ref 0.7–4.0)
MCH: 29 pg (ref 26.0–34.0)
MCHC: 33.3 g/dL (ref 30.0–36.0)
MCV: 86.9 fL (ref 80.0–100.0)
Monocytes Absolute: 0.7 10*3/uL (ref 0.1–1.0)
Monocytes Relative: 10 %
Neutro Abs: 4.2 10*3/uL (ref 1.7–7.7)
Neutrophils Relative %: 57 %
Platelets: 144 10*3/uL — ABNORMAL LOW (ref 150–400)
RBC: 4.87 MIL/uL (ref 4.22–5.81)
RDW: 18.1 % — ABNORMAL HIGH (ref 11.5–15.5)
WBC: 7.2 10*3/uL (ref 4.0–10.5)
nRBC: 0 % (ref 0.0–0.2)

## 2023-05-11 LAB — BASIC METABOLIC PANEL
Anion gap: 11 (ref 5–15)
BUN: 22 mg/dL (ref 8–23)
CO2: 24 mmol/L (ref 22–32)
Calcium: 9 mg/dL (ref 8.9–10.3)
Chloride: 101 mmol/L (ref 98–111)
Creatinine, Ser: 1.16 mg/dL (ref 0.61–1.24)
GFR, Estimated: >60 mL/min (ref >60)
Glucose, Bld: 93 mg/dL (ref 70–99)
Potassium: 4.3 mmol/L (ref 3.5–5.1)
Sodium: 136 mmol/L (ref 135–145)

## 2023-05-11 LAB — MAGNESIUM: Magnesium: 2.3 mg/dL (ref 1.7–2.4)

## 2023-05-11 NOTE — ED Provider Notes (Signed)
[facility] Emergency Department Provider Note     Event Date/Time   First MD Initiated Contact with Patient 05/11/23 1216     (approximate)   History   Headache   HPI  [name]. is a 89 y.o. male with a history of HTN, HLD, A-fib on Eliquis, CVA, and bradycardia, presents to the ED accompanied by his wife.  Patient reports for the last several days she has had intermittent head pain, localized to the head behind the right ear and at the occiput.  He reports symptoms have been intermittent but aggravated by palpation & manipulation.  Also notes pain with neck extension, localized to the cervical muscle attachment at the base of the skull.  He denies any recent injury, trauma, or falls.  Denies any frank headache, nausea, vomiting, dizziness, vision changes, or distal weakness.  Physical Exam   Triage Vital Signs: ED Triage Vitals  Enc Vitals Group     BP 05/11/23 1146 (!) 149/74     Pulse Rate 05/11/23 1146 (!) 48     Resp 05/11/23 1146 18     Temp 05/11/23 1146 98.2 F (36.8 C)     Temp Source 05/11/23 1146 Oral     SpO2 05/11/23 1146 97 %     Weight 05/11/23 1147 182 lb 1.6 oz (82.6 kg)     Height 05/11/23 1147 5\' 8"  (1.727 m)     Head Circumference --      Peak Flow --      Pain Score 05/11/23 1146 0     Pain Loc --      Pain Edu? --      Excl. in GC? --     Most recent vital signs: Vitals:   05/11/23 1146  BP: (!) 149/74  Pulse: (!) 48  Resp: 18  Temp: 98.2 F (36.8 C)  SpO2: 97%    General Awake, no distress. NAD HEENT NCAT. PERRL. EOMI. No rhinorrhea. Mucous membranes are moist.  CV:  Good peripheral perfusion.  RESP:  Normal effort.  ABD:  No distention.  NEURO: Cranial nerves II to XII grossly intact.  No midline spinal tenderness is appreciated. MSK:  Normal active range of motion of the cervical spine.  Patient without any   ED Results / Procedures / Treatments   Labs (all labs ordered are listed, but  only abnormal results are displayed) Labs Reviewed  CBC WITH DIFFERENTIAL/PLATELET - Abnormal; Notable for the following components:      Result Value   RDW 18.1 (*)    Platelets 144 (*)    All other components within normal limits  BASIC METABOLIC PANEL  MAGNESIUM    EKG  Vent. rate 55 BPM PR interval * ms QRS duration 92 ms QT/QTcB 438/419 ms P-R-T axes * -55 108 Atrial fibrillation with slow ventricular response Left axis deviation Septal infarct , age undetermined Inferior infarct (cited on or before 09-Jan-2023) Abnormal ECG When compared with ECG of 09-Jan-2023 14:28, QRS axis Shifted left Septal infarct is now Present  ----------------------------------------- 2:31 PM on 05/11/2023 ----------------------------------------- S/W Dr. Mariah Milling: he has reviewed current and  previous EKG (04/09/23) he reviewed images I submitted, and believes that images are stable and consistent with A-fib.  He denies any concern for any evidence of heart block.  RADIOLOGY  I personally viewed and evaluated these images as part of my medical decision making, as well as reviewing the written report by the radiologist.  ED Provider  Interpretation: No acute findings  CT Cervical Spine Wo Contrast  Result Date: 05/11/2023 CLINICAL DATA:  Headache, new onset (Age >= 51y) right posterior occiput pain; on Eliquis; Neck pain, acute, no red flags proximal right sided neck pain. EXAM: CT HEAD WITHOUT CONTRAST CT CERVICAL SPINE WITHOUT CONTRAST TECHNIQUE: Multidetector CT imaging of the head and cervical spine was performed following the standard protocol without intravenous contrast. Multiplanar CT image reconstructions of the cervical spine were also generated. RADIATION DOSE REDUCTION: This exam was performed according to the departmental dose-optimization program which includes automated exposure control, adjustment of the mA and/or kV according to patient size and/or use of iterative reconstruction  technique. COMPARISON:  CT head and cervical spine 01/08/2023. FINDINGS: CT HEAD FINDINGS Brain: No acute hemorrhage. Unchanged encephalomalacia along the left parieto-occipital junction from prior infarct. Stable mild chronic small-vessel disease and temporal predominant volume loss with bilateral hippocampal atrophy. No acute hydrocephalus or extra-axial collection. No mass effect or midline shift. Vascular: No hyperdense vessel or unexpected calcification. Skull: No calvarial fracture or suspicious bone lesion. Skull base is unremarkable. Sinuses/Orbits: Unremarkable. Other: None. CT CERVICAL SPINE FINDINGS Alignment: No traumatic malalignment. Skull base and vertebrae: No acute fracture. Normal craniocervical junction. No suspicious bone lesions. Soft tissues and spinal canal: No prevertebral fluid or swelling. No visible canal hematoma. Disc levels: Multilevel cervical spondylosis, worst at C4-5, where there is at least mild spinal canal stenosis. Upper chest: Unremarkable. Other: Atherosclerotic calcifications of the carotid bulbs. IMPRESSION: 1. No acute intracranial process. 2. No acute fracture or traumatic malalignment in the cervical spine. 3. Multilevel cervical spondylosis, worst at C4-5 where there is at least mild spinal canal stenosis. Electronically Signed   By: Orvan Falconer M.D.   On: 05/11/2023 14:03   CT HEAD WO CONTRAST ( )  Result Date: 05/11/2023 CLINICAL DATA:  Headache, new onset (Age >= 51y) right posterior occiput pain; on Eliquis; Neck pain, acute, no red flags proximal right sided neck pain. EXAM: CT HEAD WITHOUT CONTRAST CT CERVICAL SPINE WITHOUT CONTRAST TECHNIQUE: Multidetector CT imaging of the head and cervical spine was performed following the standard protocol without intravenous contrast. Multiplanar CT image reconstructions of the cervical spine were also generated. RADIATION DOSE REDUCTION: This exam was performed according to the departmental dose-optimization program  which includes automated exposure control, adjustment of the mA and/or kV according to patient size and/or use of iterative reconstruction technique. COMPARISON:  CT head and cervical spine 01/08/2023. FINDINGS: CT HEAD FINDINGS Brain: No acute hemorrhage. Unchanged encephalomalacia along the left parieto-occipital junction from prior infarct. Stable mild chronic small-vessel disease and temporal predominant volume loss with bilateral hippocampal atrophy. No acute hydrocephalus or extra-axial collection. No mass effect or midline shift. Vascular: No hyperdense vessel or unexpected calcification. Skull: No calvarial fracture or suspicious bone lesion. Skull base is unremarkable. Sinuses/Orbits: Unremarkable. Other: None. CT CERVICAL SPINE FINDINGS Alignment: No traumatic malalignment. Skull base and vertebrae: No acute fracture. Normal craniocervical junction. No suspicious bone lesions. Soft tissues and spinal canal: No prevertebral fluid or swelling. No visible canal hematoma. Disc levels: Multilevel cervical spondylosis, worst at C4-5, where there is at least mild spinal canal stenosis. Upper chest: Unremarkable. Other: Atherosclerotic calcifications of the carotid bulbs. IMPRESSION: 1. No acute intracranial process. 2. No acute fracture or traumatic malalignment in the cervical spine. 3. Multilevel cervical spondylosis, worst at C4-5 where there is at least mild spinal canal stenosis. Electronically Signed   By: Orvan Falconer M.D.   On: 05/11/2023 14:03  PROCEDURES:  Critical Care performed: No  Procedures   MEDICATIONS ORDERED IN ED: Medications - No data to display   IMPRESSION / MDM / ASSESSMENT AND PLAN / ED COURSE  I reviewed the triage vital signs and the nursing notes.                              Differential diagnosis includes, but is not limited to, intracranial hemorrhage, meningitis/encephalitis, previous head trauma, cavernous venous thrombosis, tension headache, temporal  arteritis, migraine or migraine equivalent, idiopathic intracranial hypertension, and non-specific headache.   Patient's presentation is most consistent with acute presentation with potential threat to life or bodily function.  Patient's diagnosis is consistent with reproducible head pain at the posterior occiput, likely representing musculoskeletal etiology.  Patient with reassuring exam and workup overall.  No trauma preceding the onset of his symptoms.  No reports of any visual changes, jaw claudication, distal paresthesias, weakness, or frank headache associated with the symptoms.  CT imaging of the head and neck are reassuring as it shows no acute intracranial process.  No evidence of acute hemorrhagic bleed, and no evidence of any acute spinal fracture or spinal cord stenosis.  EKG performed and evaluated by my attending, read as A-fib with bradycardia rate of 55 bpm.  Concern for heart block was discussed with Dr. Mariah Milling, who find EKGs to be stable when compared to previous.  Patient will be discharged home with instruction to take OTC Tylenol as needed for pain.. Patient is to follow up with primary provider as needed or otherwise directed. Patient is given ED precautions to return to the ED for any worsening or new symptoms.  FINAL CLINICAL IMPRESSION(S) / ED DIAGNOSES   Final diagnoses:  Acute nonintractable headache, unspecified headache type  Musculoskeletal neck pain     Rx / DC Orders   ED Discharge Orders     None        Note:  This document was prepared using Dragon voice recognition software and may include unintentional dictation errors.    Lissa Hoard, PA-C 05/11/23 1832    Calvin Noon, MD 05/12/23 1056

## 2023-05-11 NOTE — Discharge Instructions (Addendum)
Your exam, labs, EKG, and CT scans are all normal and reassuring at this time.  No signs any serious underlying cause for your intermittent posterior head and neck pain.  Your symptoms may be due to a musculoskeletal cause which is why the pain is reproducible with neck movement and palpation.  Take OTC Tylenol as needed for pain.  Consider applying warm compress to the neck as well.  Follow-up with primary provider for ongoing concerns.  Return to the ED if needed.

## 2023-05-11 NOTE — ED Triage Notes (Signed)
Pt to ER with c/o pain behind right ear and head.  PT states pain for last several days that has been intermittent.  PT reports increased pain to palpation.  Pt denies known injury. Pt with hx of CVA,new onset Afib, started on Eliquis a week ago.

## 2023-06-11 ENCOUNTER — Ambulatory Visit: Payer: Medicare Other | Admitting: Internal Medicine

## 2023-06-14 ENCOUNTER — Ambulatory Visit: Payer: Medicare Other | Admitting: Cardiovascular Disease

## 2023-06-15 ENCOUNTER — Encounter
Admission: EM | Disposition: A | Discharge status: Other Acute Inpatient Hospital | Payer: Self-pay | Source: Other Acute Inpatient Hospital | Attending: Internal Medicine

## 2023-06-15 ENCOUNTER — Emergency Department: Payer: Medicare Other

## 2023-06-15 ENCOUNTER — Inpatient Hospital Stay
Admission: EM | Admit: 2023-06-15 | Discharge: 2023-06-15 | DRG: 281 | Disposition: A | Discharge status: Other Acute Inpatient Hospital | Payer: Medicare Other | Source: Other Acute Inpatient Hospital | Attending: Internal Medicine | Admitting: Internal Medicine

## 2023-06-15 ENCOUNTER — Inpatient Hospital Stay (HOSPITAL_COMMUNITY)
Admit: 2023-06-15 | Discharge: 2023-06-18 | DRG: 280 | Disposition: A | Discharge status: Home Health | Payer: Medicare Other | Source: Other Acute Inpatient Hospital | Attending: Cardiology | Admitting: Cardiology

## 2023-06-15 ENCOUNTER — Encounter (HOSPITAL_COMMUNITY): Payer: Self-pay

## 2023-06-15 ENCOUNTER — Other Ambulatory Visit: Payer: Self-pay

## 2023-06-15 DIAGNOSIS — I130 Hypertensive heart and chronic kidney disease with heart failure and stage 1 through stage 4 chronic kidney disease, or unspecified chronic kidney disease: Secondary | ICD-10-CM | POA: Diagnosis present

## 2023-06-15 DIAGNOSIS — I214 Non-ST elevation (NSTEMI) myocardial infarction: Secondary | ICD-10-CM | POA: Diagnosis not present

## 2023-06-15 DIAGNOSIS — I69320 Aphasia following cerebral infarction: Secondary | ICD-10-CM

## 2023-06-15 DIAGNOSIS — E785 Hyperlipidemia, unspecified: Secondary | ICD-10-CM | POA: Diagnosis present

## 2023-06-15 DIAGNOSIS — Y831 Surgical operation with implant of artificial internal device as the cause of abnormal reaction of the patient, or of later complication, without mention of misadventure at the time of the procedure: Secondary | ICD-10-CM | POA: Diagnosis present

## 2023-06-15 DIAGNOSIS — Z955 Presence of coronary angioplasty implant and graft: Secondary | ICD-10-CM | POA: Diagnosis not present

## 2023-06-15 DIAGNOSIS — R001 Bradycardia, unspecified: Secondary | ICD-10-CM | POA: Diagnosis present

## 2023-06-15 DIAGNOSIS — I5032 Chronic diastolic (congestive) heart failure: Secondary | ICD-10-CM | POA: Diagnosis present

## 2023-06-15 DIAGNOSIS — I21A9 Other myocardial infarction type: Secondary | ICD-10-CM | POA: Diagnosis present

## 2023-06-15 DIAGNOSIS — I959 Hypotension, unspecified: Secondary | ICD-10-CM | POA: Diagnosis present

## 2023-06-15 DIAGNOSIS — I10 Essential (primary) hypertension: Secondary | ICD-10-CM | POA: Diagnosis not present

## 2023-06-15 DIAGNOSIS — I5021 Acute systolic (congestive) heart failure: Secondary | ICD-10-CM | POA: Diagnosis present

## 2023-06-15 DIAGNOSIS — L97529 Non-pressure chronic ulcer of other part of left foot with unspecified severity: Secondary | ICD-10-CM | POA: Diagnosis not present

## 2023-06-15 DIAGNOSIS — D6869 Other thrombophilia: Secondary | ICD-10-CM | POA: Diagnosis not present

## 2023-06-15 DIAGNOSIS — E1142 Type 2 diabetes mellitus with diabetic polyneuropathy: Secondary | ICD-10-CM | POA: Diagnosis present

## 2023-06-15 DIAGNOSIS — I2510 Atherosclerotic heart disease of native coronary artery without angina pectoris: Secondary | ICD-10-CM | POA: Diagnosis not present

## 2023-06-15 DIAGNOSIS — I440 Atrioventricular block, first degree: Secondary | ICD-10-CM | POA: Diagnosis present

## 2023-06-15 DIAGNOSIS — I482 Chronic atrial fibrillation, unspecified: Secondary | ICD-10-CM | POA: Diagnosis present

## 2023-06-15 DIAGNOSIS — Z85828 Personal history of other malignant neoplasm of skin: Secondary | ICD-10-CM

## 2023-06-15 DIAGNOSIS — Z7901 Long term (current) use of anticoagulants: Secondary | ICD-10-CM | POA: Diagnosis not present

## 2023-06-15 DIAGNOSIS — Z8249 Family history of ischemic heart disease and other diseases of the circulatory system: Secondary | ICD-10-CM

## 2023-06-15 DIAGNOSIS — I255 Ischemic cardiomyopathy: Secondary | ICD-10-CM | POA: Diagnosis present

## 2023-06-15 DIAGNOSIS — Z885 Allergy status to narcotic agent status: Secondary | ICD-10-CM | POA: Diagnosis not present

## 2023-06-15 DIAGNOSIS — I480 Paroxysmal atrial fibrillation: Secondary | ICD-10-CM | POA: Diagnosis not present

## 2023-06-15 DIAGNOSIS — K259 Gastric ulcer, unspecified as acute or chronic, without hemorrhage or perforation: Secondary | ICD-10-CM | POA: Diagnosis present

## 2023-06-15 DIAGNOSIS — N400 Enlarged prostate without lower urinary tract symptoms: Secondary | ICD-10-CM | POA: Diagnosis present

## 2023-06-15 DIAGNOSIS — G250 Essential tremor: Secondary | ICD-10-CM | POA: Diagnosis present

## 2023-06-15 DIAGNOSIS — Z7982 Long term (current) use of aspirin: Secondary | ICD-10-CM

## 2023-06-15 DIAGNOSIS — Z9861 Coronary angioplasty status: Secondary | ICD-10-CM | POA: Diagnosis not present

## 2023-06-15 DIAGNOSIS — I6521 Occlusion and stenosis of right carotid artery: Secondary | ICD-10-CM | POA: Diagnosis present

## 2023-06-15 DIAGNOSIS — I110 Hypertensive heart disease with heart failure: Secondary | ICD-10-CM | POA: Diagnosis present

## 2023-06-15 DIAGNOSIS — E871 Hypo-osmolality and hyponatremia: Secondary | ICD-10-CM | POA: Diagnosis not present

## 2023-06-15 DIAGNOSIS — Z880 Allergy status to penicillin: Secondary | ICD-10-CM

## 2023-06-15 DIAGNOSIS — Z79899 Other long term (current) drug therapy: Secondary | ICD-10-CM

## 2023-06-15 DIAGNOSIS — T82855A Stenosis of coronary artery stent, initial encounter: Secondary | ICD-10-CM | POA: Diagnosis not present

## 2023-06-15 DIAGNOSIS — Z888 Allergy status to other drugs, medicaments and biological substances status: Secondary | ICD-10-CM

## 2023-06-15 DIAGNOSIS — I4819 Other persistent atrial fibrillation: Secondary | ICD-10-CM | POA: Insufficient documentation

## 2023-06-15 DIAGNOSIS — N1831 Chronic kidney disease, stage 3a: Secondary | ICD-10-CM | POA: Diagnosis present

## 2023-06-15 DIAGNOSIS — I4891 Unspecified atrial fibrillation: Secondary | ICD-10-CM | POA: Diagnosis not present

## 2023-06-15 DIAGNOSIS — I495 Sick sinus syndrome: Secondary | ICD-10-CM | POA: Diagnosis not present

## 2023-06-15 DIAGNOSIS — Z8673 Personal history of transient ischemic attack (TIA), and cerebral infarction without residual deficits: Secondary | ICD-10-CM | POA: Diagnosis not present

## 2023-06-15 DIAGNOSIS — E7800 Pure hypercholesterolemia, unspecified: Secondary | ICD-10-CM | POA: Diagnosis not present

## 2023-06-15 DIAGNOSIS — Z8711 Personal history of peptic ulcer disease: Secondary | ICD-10-CM

## 2023-06-15 HISTORY — PX: LEFT HEART CATH AND CORONARY ANGIOGRAPHY: CATH118249

## 2023-06-15 LAB — HEMOGLOBIN A1C
Hgb A1c MFr Bld: 6.3 % — ABNORMAL HIGH (ref 4.8–5.6)
Mean Plasma Glucose: 134.11 mg/dL

## 2023-06-15 LAB — CBC
HCT: 37.5 % — ABNORMAL LOW (ref 39.0–52.0)
Hemoglobin: 13.1 g/dL (ref 13.0–17.0)
MCH: 30.8 pg (ref 26.0–34.0)
MCHC: 34.9 g/dL (ref 30.0–36.0)
MCV: 88 fL (ref 80.0–100.0)
Platelets: 163 10*3/uL (ref 150–400)
RBC: 4.26 MIL/uL (ref 4.22–5.81)
RDW: 17.1 % — ABNORMAL HIGH (ref 11.5–15.5)
WBC: 7.2 10*3/uL (ref 4.0–10.5)
nRBC: 0 % (ref 0.0–0.2)

## 2023-06-15 LAB — TROPONIN I (HIGH SENSITIVITY)
Troponin I (High Sensitivity): 1055 ng/L (ref <18)
Troponin I (High Sensitivity): 1324 ng/L (ref <18)

## 2023-06-15 LAB — BASIC METABOLIC PANEL
Anion gap: 8 (ref 5–15)
BUN: 21 mg/dL (ref 8–23)
CO2: 25 mmol/L (ref 22–32)
Calcium: 8.8 mg/dL — ABNORMAL LOW (ref 8.9–10.3)
Chloride: 102 mmol/L (ref 98–111)
Creatinine, Ser: 1.18 mg/dL (ref 0.61–1.24)
GFR, Estimated: 60 mL/min — ABNORMAL LOW (ref >60)
Glucose, Bld: 129 mg/dL — ABNORMAL HIGH (ref 70–99)
Potassium: 4.5 mmol/L (ref 3.5–5.1)
Sodium: 135 mmol/L (ref 135–145)

## 2023-06-15 LAB — MRSA NEXT GEN BY PCR, NASAL: MRSA by PCR Next Gen: NOT DETECTED

## 2023-06-15 LAB — GLUCOSE, CAPILLARY: Glucose-Capillary: 91 mg/dL (ref 70–99)

## 2023-06-15 LAB — BRAIN NATRIURETIC PEPTIDE: B Natriuretic Peptide: 391.9 pg/mL — ABNORMAL HIGH (ref 0.0–100.0)

## 2023-06-15 LAB — HEPARIN LEVEL (UNFRACTIONATED): Heparin Unfractionated: 0.47 IU/mL (ref 0.30–0.70)

## 2023-06-15 LAB — APTT: aPTT: 36 seconds (ref 24–36)

## 2023-06-15 SURGERY — LEFT HEART CATH AND CORONARY ANGIOGRAPHY
Anesthesia: Moderate Sedation

## 2023-06-15 MED ORDER — ONDANSETRON HCL 4 MG/2ML IJ SOLN: 4.0000 mg | Freq: Four times a day (QID) | INTRAMUSCULAR | Status: DC | PRN

## 2023-06-15 MED ORDER — RISAQUAD PO CAPS: 1.0000 | ORAL_CAPSULE | Freq: Every day | ORAL | Status: DC

## 2023-06-15 MED ORDER — NITROGLYCERIN 0.4 MG SL SUBL
0.4000 mg | SUBLINGUAL_TABLET | SUBLINGUAL | Status: DC | PRN
  Administered 2023-06-15: 0.4 mg via SUBLINGUAL
  Filled 2023-06-15: qty 1

## 2023-06-15 MED ORDER — ACETAMINOPHEN 325 MG PO TABS: 650.0000 mg | ORAL_TABLET | Freq: Four times a day (QID) | ORAL | Status: DC | PRN

## 2023-06-15 MED ORDER — ACETAMINOPHEN ER 650 MG PO TBCR: 650.0000 mg | EXTENDED_RELEASE_TABLET | Freq: Three times a day (TID) | ORAL | Status: DC | PRN

## 2023-06-15 MED ORDER — IOHEXOL 300 MG/ML  SOLN
INTRAMUSCULAR | Status: DC | PRN
  Administered 2023-06-15: 100 mL

## 2023-06-15 MED ORDER — EZETIMIBE 10 MG PO TABS
10.0000 mg | ORAL_TABLET | Freq: Every day | ORAL | Status: DC
  Filled 2023-06-15: qty 1

## 2023-06-15 MED ORDER — VITAMIN D 25 MCG (1000 UNIT) PO TABS: 2000.0000 [IU] | ORAL_TABLET | Freq: Every day | ORAL | Status: DC

## 2023-06-15 MED ORDER — ATORVASTATIN CALCIUM 20 MG PO TABS: 80.0000 mg | ORAL_TABLET | Freq: Every day | ORAL | Status: DC

## 2023-06-15 MED ORDER — ATORVASTATIN CALCIUM 40 MG PO TABS
40.0000 mg | ORAL_TABLET | Freq: Every day | ORAL | Status: DC
  Administered 2023-06-16 – 2023-06-18 (×3): 40 mg via ORAL
  Filled 2023-06-15 (×3): qty 1

## 2023-06-15 MED ORDER — TAMSULOSIN HCL 0.4 MG PO CAPS
0.8000 mg | ORAL_CAPSULE | Freq: Every day | ORAL | Status: DC
  Administered 2023-06-16 – 2023-06-18 (×3): 0.8 mg via ORAL
  Filled 2023-06-15 (×3): qty 2

## 2023-06-15 MED ORDER — B COMPLEX-C PO TABS: 1.0000 | ORAL_TABLET | Freq: Every day | ORAL | Status: DC

## 2023-06-15 MED ORDER — FLAXSEED (LINSEED) 1000 MG PO CAPS: 2000.0000 mg | ORAL_CAPSULE | Freq: Every day | ORAL | Status: DC

## 2023-06-15 MED ORDER — HEPARIN (PORCINE) IN NACL 1000-0.9 UT/500ML-% IV SOLN
INTRAVENOUS | Status: AC
  Filled 2023-06-15: qty 1000

## 2023-06-15 MED ORDER — EZETIMIBE 10 MG PO TABS
10.0000 mg | ORAL_TABLET | Freq: Every day | ORAL | Status: DC
  Administered 2023-06-16 – 2023-06-18 (×3): 10 mg via ORAL
  Filled 2023-06-15 (×3): qty 1

## 2023-06-15 MED ORDER — DOCUSATE SODIUM 100 MG PO CAPS: 100.0000 mg | ORAL_CAPSULE | Freq: Two times a day (BID) | ORAL | Status: DC

## 2023-06-15 MED ORDER — MORPHINE SULFATE (PF) 2 MG/ML IV SOLN
1.0000 mg | INTRAVENOUS | Status: DC | PRN
  Filled 2023-06-15: qty 1

## 2023-06-15 MED ORDER — PREGABALIN 75 MG PO CAPS: 100.0000 mg | ORAL_CAPSULE | Freq: Every morning | ORAL | Status: DC

## 2023-06-15 MED ORDER — HYDRALAZINE HCL 20 MG/ML IJ SOLN: 5.0000 mg | INTRAMUSCULAR | Status: DC | PRN

## 2023-06-15 MED ORDER — HEPARIN (PORCINE) 25000 UT/250ML-% IV SOLN: 1150.0000 [IU]/h | INTRAVENOUS | Status: DC

## 2023-06-15 MED ORDER — ONDANSETRON HCL 4 MG/2ML IJ SOLN: 4.0000 mg | Freq: Three times a day (TID) | INTRAMUSCULAR | Status: DC | PRN

## 2023-06-15 MED ORDER — PREGABALIN 75 MG PO CAPS: 150.0000 mg | ORAL_CAPSULE | Freq: Every day | ORAL | Status: DC

## 2023-06-15 MED ORDER — HEPARIN (PORCINE) 25000 UT/250ML-% IV SOLN
1150.0000 [IU]/h | INTRAVENOUS | Status: DC
  Administered 2023-06-15: 1150 [IU]/h via INTRAVENOUS
  Filled 2023-06-15: qty 250

## 2023-06-15 MED ORDER — VITAMIN B-12 1000 MCG PO TABS: 1000.0000 ug | ORAL_TABLET | Freq: Every day | ORAL | Status: DC

## 2023-06-15 MED ORDER — HEPARIN (PORCINE) 25000 UT/250ML-% IV SOLN
1250.0000 [IU]/h | INTRAVENOUS | Status: DC
  Administered 2023-06-15: 1150 [IU]/h via INTRAVENOUS
  Administered 2023-06-16: 1350 [IU]/h via INTRAVENOUS
  Filled 2023-06-15 (×2): qty 250

## 2023-06-15 MED ORDER — ZINC SULFATE 220 (50 ZN) MG PO CAPS: 220.0000 mg | ORAL_CAPSULE | Freq: Every day | ORAL | Status: DC

## 2023-06-15 MED ORDER — PREGABALIN 75 MG PO CAPS
150.0000 mg | ORAL_CAPSULE | Freq: Every day | ORAL | Status: DC
  Administered 2023-06-15 – 2023-06-17 (×3): 150 mg via ORAL
  Filled 2023-06-15 (×3): qty 2

## 2023-06-15 MED ORDER — PANTOPRAZOLE SODIUM 40 MG PO TBEC
40.0000 mg | DELAYED_RELEASE_TABLET | Freq: Every day | ORAL | Status: DC
  Administered 2023-06-16 – 2023-06-18 (×3): 40 mg via ORAL
  Filled 2023-06-15 (×3): qty 1

## 2023-06-15 MED ORDER — LISINOPRIL 10 MG PO TABS
10.0000 mg | ORAL_TABLET | Freq: Every day | ORAL | Status: DC
  Administered 2023-06-16 – 2023-06-17 (×2): 10 mg via ORAL
  Filled 2023-06-15 (×2): qty 1

## 2023-06-15 MED ORDER — LACTATED RINGERS IV BOLUS
1000.0000 mL | Freq: Once | INTRAVENOUS | Status: AC
  Administered 2023-06-15: 1000 mL via INTRAVENOUS

## 2023-06-15 MED ORDER — HEPARIN SODIUM (PORCINE) 1000 UNIT/ML IJ SOLN
INTRAMUSCULAR | Status: AC
  Filled 2023-06-15: qty 10

## 2023-06-15 MED ORDER — ASPIRIN 81 MG PO TBEC
81.0000 mg | DELAYED_RELEASE_TABLET | Freq: Every day | ORAL | Status: DC
  Administered 2023-06-16 – 2023-06-17 (×2): 81 mg via ORAL
  Filled 2023-06-15 (×2): qty 1

## 2023-06-15 MED ORDER — NITROGLYCERIN 0.4 MG SL SUBL: 0.4000 mg | SUBLINGUAL_TABLET | SUBLINGUAL | Status: DC | PRN

## 2023-06-15 MED ORDER — MORPHINE SULFATE (PF) 2 MG/ML IV SOLN
2.0000 mg | Freq: Once | INTRAVENOUS | Status: AC
  Administered 2023-06-15: 2 mg via INTRAVENOUS
  Filled 2023-06-15: qty 1

## 2023-06-15 MED ORDER — ACETAMINOPHEN 325 MG PO TABS
650.0000 mg | ORAL_TABLET | ORAL | Status: DC | PRN
  Administered 2023-06-15: 650 mg via ORAL
  Filled 2023-06-15: qty 2

## 2023-06-15 MED ORDER — TAMSULOSIN HCL 0.4 MG PO CAPS: 0.8000 mg | ORAL_CAPSULE | Freq: Every day | ORAL | Status: DC

## 2023-06-15 MED ORDER — ONDANSETRON HCL 4 MG/2ML IJ SOLN
4.0000 mg | Freq: Once | INTRAMUSCULAR | Status: AC
  Administered 2023-06-15: 4 mg via INTRAVENOUS
  Filled 2023-06-15: qty 2

## 2023-06-15 MED ORDER — PANTOPRAZOLE SODIUM 40 MG PO TBEC: 40.0000 mg | DELAYED_RELEASE_TABLET | Freq: Two times a day (BID) | ORAL | Status: DC

## 2023-06-15 MED ORDER — LIDOCAINE HCL (PF) 1 % IJ SOLN
INTRAMUSCULAR | Status: DC | PRN
  Administered 2023-06-15: 20 mL

## 2023-06-15 MED ORDER — VERAPAMIL HCL 2.5 MG/ML IV SOLN
INTRAVENOUS | Status: AC
  Filled 2023-06-15: qty 2

## 2023-06-15 MED ORDER — HEPARIN (PORCINE) IN NACL 1000-0.9 UT/500ML-% IV SOLN
INTRAVENOUS | Status: DC | PRN
  Administered 2023-06-15 (×2): 500 mL

## 2023-06-15 MED ORDER — NITROGLYCERIN IN D5W 200-5 MCG/ML-% IV SOLN
0.0000 ug/min | INTRAVENOUS | Status: DC
  Administered 2023-06-15: 5 ug/min via INTRAVENOUS

## 2023-06-15 MED ORDER — VITAMIN C 500 MG PO TABS: 1000.0000 mg | ORAL_TABLET | Freq: Every day | ORAL | Status: DC

## 2023-06-15 SURGICAL SUPPLY — 16 items
CATH INFINITI 5FR AL1 (CATHETERS) IMPLANT
CATH INFINITI 5FR MULTPACK ANG (CATHETERS) IMPLANT
DEVICE CLOSURE MYNXGRIP 6/7F (Vascular Products) IMPLANT
DRAPE BRACHIAL (DRAPES) IMPLANT
GUIDEWIRE INQWIRE 1.5J.035X260 (WIRE) IMPLANT
INQWIRE 1.5J .035X260CM (WIRE) ×1
KIT MICROPUNCTURE NIT STIFF (SHEATH) IMPLANT
KIT SYRINGE INJ CVI SPIKEX1 (MISCELLANEOUS) IMPLANT
PACK CARDIAC CATH (CUSTOM PROCEDURE TRAY) ×1 IMPLANT
PAD ONESTEP ZOLL R SERIES ADT (MISCELLANEOUS) IMPLANT
PROTECTION STATION PRESSURIZED (MISCELLANEOUS) ×1
SET ATX-X65L (MISCELLANEOUS) IMPLANT
SHEATH AVANTI 6FR X 11CM (SHEATH) IMPLANT
STATION PROTECTION PRESSURIZED (MISCELLANEOUS) IMPLANT
WIRE EMERALD 3MM-J .035X150CM (WIRE) IMPLANT
WIRE EMERALD ST .035X150CM (WIRE) IMPLANT

## 2023-06-15 NOTE — Progress Notes (Signed)
ANTICOAGULATION CONSULT NOTE - Initial Consult  Pharmacy Consult for heparin Indication: chest pain/ACS  Allergies  Allergen Reactions   Penicillins Rash    Rash    Rash    Gabapentin    Penicillin G    Codeine Nausea Only and Nausea And Vomiting    Other reaction(s): Vomiting Other reaction(s): Vomiting    Patient Measurements:    Vital Signs: Temp: 98.4 F (36.9 C) (07/13 1951) Temp Source: Oral (07/13 1951) BP: 95/70 (07/13 1951) Pulse Rate: 60 (07/13 1951)  Labs: Recent Labs    06/15/23 0926 06/15/23 1214 06/15/23 1215  HGB 13.1  --   --   HCT 37.5*  --   --   PLT 163  --   --   APTT  --   --  36  HEPARINUNFRC  --   --  0.47  CREATININE 1.18  --   --   TROPONINIHS 1,055* 1,324*  --     Estimated Creatinine Clearance: 46.3 mL/min (by C-G formula based on SCr of 1.18 mg/dL).   Medical History: Past Medical History:  Diagnosis Date   Acute embolic stroke (HCC) 02/01/2017   BPH (benign prostatic hyperplasia)    Bradycardia 11/10/2015   Coronary artery disease    Dizziness 08/21/2012   DYSPEPSIA 07/31/2010   Qualifier: Diagnosis of  By: Janee Morn, RN, Morrie Sheldon     Hyperlipidemia 05/16/2010   Qualifier: Diagnosis of  By: Mariah Milling MD, [name]      Hypertension    HYPERTENSION, BENIGN 05/16/2010   Qualifier: Diagnosis of  By: Mariah Milling MD, [name]     Stenosis of right carotid artery 02/01/2017    Assessment: 89 YOM transfer from [facility] s/p angiography and now to resume heparin gtt (sheath removal 7/13 @1442 ), on Eliquis PTA for afib  Goal of Therapy:  Heparin level 0.3-0.7 units/ml aPTT 66-102 seconds Monitor platelets by anticoagulation protocol: Yes   Plan:  Resume heparin gtt at 1150 units/hr F/u 8 hour aPTT/HL F/u PCI plans  Daylene Posey, PharmD, [facility] Clinical Pharmacist ED Pharmacist Phone # [phone] 06/15/2023 9:48 PM

## 2023-06-15 NOTE — Consult Note (Signed)
ANTICOAGULATION CONSULT NOTE - Initial Consult  Pharmacy Consult for heparin infusion Indication: chest pain/ACS, hx of afib on apixaban PTA  Allergies  Allergen Reactions   Penicillins Rash    Rash    Rash    Gabapentin    Penicillin G    Codeine Nausea Only and Nausea And Vomiting    Other reaction(s): Vomiting Other reaction(s): Vomiting    Patient Measurements: Height: 5\' 8"  (172.7 cm) Weight: 82.8 kg (182 lb 9.6 oz) IBW/kg (Calculated) : 68.4 Heparin Dosing Weight: 82.8 kg   Vital Signs: Temp: 98.4 F (36.9 C) (07/13 0929) Temp Source: Oral (07/13 0929) BP: 104/58 (07/13 1130) Pulse Rate: 48 (07/13 1130)  Labs: Recent Labs    06/15/23 0926 06/15/23 1214 06/15/23 1215  HGB 13.1  --   --   HCT 37.5*  --   --   PLT 163  --   --   APTT  --   --  36  HEPARINUNFRC  --   --  0.47  CREATININE 1.18  --   --   TROPONINIHS 1,055* 1,324*  --     Estimated Creatinine Clearance: 46.3 mL/min (by C-G formula based on SCr of 1.18 mg/dL).   Medical History: Past Medical History:  Diagnosis Date   Acute embolic stroke (HCC) 02/01/2017   BPH (benign prostatic hyperplasia)    Bradycardia 11/10/2015   Coronary artery disease    Dizziness 08/21/2012   DYSPEPSIA 07/31/2010   Qualifier: Diagnosis of  By: Janee Morn RN, Morrie Sheldon     Hyperlipidemia 05/16/2010   Qualifier: Diagnosis of  By: Mariah Milling MD, [name]      Hypertension    HYPERTENSION, BENIGN 05/16/2010   Qualifier: Diagnosis of  By: Mariah Milling MD, [name]     Stenosis of right carotid artery 02/01/2017    Medications:  Apixaban 5 mg BID - last dose 7/12 PM  Assessment: 89 y.o. male with past medical history of hypertension, hyperlipidemia, stroke, CAD, atrial fibrillation on Eliquis, presented to ED complaining of chest pain. Inititial troponin elevated at 1055. Pharmacy has been consulted to initiate and manage IV heparin therapy.    Baseline: HL 0.47, aPTT 36  Goal of Therapy:  Heparin level 0.3-0.7 units/ml aPTT 66-102  seconds Monitor platelets by anticoagulation protocol: Yes   Plan:  Resume heparin infusion 6 hours after sheath removal, removal occurred 7/13/ @ 1442 Resume heparin infusion at 1150  units/hr, 7/13 @ 2045 Check aPTT level in 8 hours given elevated baseline HL ISO recent DOAC Transition to HL once therapeutic and correlating with aPTT Continue to monitor H&H and platelets  Barrie Folk, PharmD Clinical Pharmacist   06/15/2023,3:33 PM

## 2023-06-15 NOTE — Progress Notes (Signed)
PHARMACIST - PHYSICIAN ORDER COMMUNICATION  CONCERNING: P&T Medication Policy on Herbal Medications  DESCRIPTION:  This patient's order for:  Flaxseed Capsules  has been noted.  This product(s) is classified as an "herbal" or natural product. Due to a lack of definitive safety studies or FDA approval, nonstandard manufacturing practices, plus the potential risk of unknown drug-drug interactions while on inpatient medications, the Pharmacy and Therapeutics Committee does not permit the use of "herbal" or natural products of this type within [facility].   ACTION TAKEN: The pharmacy department is unable to verify this order at this time and your patient has been informed of this safety policy. Please reevaluate patient's clinical condition at discharge and address if the herbal or natural product(s) should be resumed at that time.

## 2023-06-15 NOTE — Progress Notes (Signed)
Patient received in room via [facility]. Patient was started on nitroglycerin drip for chest pain by [facility]. Patient was received with nitroglycerin at 46mcg/min and NS at 37mL/hr. Patient's son at bedside with him. This RN notified Dr. Orson Aloe with cardiology of patient's arrival via [facility] page.

## 2023-06-15 NOTE — ED Notes (Signed)
Pt stating "pain is just about gone" when asked pain score, pt and family have no needs at this time. Will cont to monitor.

## 2023-06-15 NOTE — ED Provider Notes (Signed)
[facility] Provider Note    Event Date/Time   First MD Initiated Contact with Patient 06/15/23 [phone]     (approximate)   History   Chief Complaint Chest Pain   HPI  Calvin Diebel Artin Bouch. is a 89 y.o. male with past medical history of hypertension, hyperlipidemia, CAD, atrial fibrillation on Eliquis, bradycardia, stroke, and tremor who presents to the ED complaining of chest pain.  Wife reports that patient had intermittent discomfort in his chest earlier in the week that had resolved when he saw his PCP a couple of days ago.  Since getting up this morning, patient has complained of more persistent discomfort in the center of his chest.  He has some aphasia due to prior stroke and has a difficult time describing this pain, but wife states she knew something was wrong when he went to get back in bed.  Patient denies any difficulty breathing and he has not had any fever or cough recently.  He has not noticed any pain or swelling in his legs.  He was given 324 mg of aspirin by EMS prior to arrival.     Physical Exam   Triage Vital Signs: ED Triage Vitals  Encounter Vitals Group     BP 06/15/23 0929 112/65     Systolic BP Percentile --      Diastolic BP Percentile --      Pulse Rate 06/15/23 0929 (!) 56     Resp 06/15/23 0929 12     Temp 06/15/23 0929 98.4 F (36.9 C)     Temp Source 06/15/23 0929 Oral     SpO2 06/15/23 0929 100 %     Weight 06/15/23 0923 182 lb 9.6 oz (82.8 kg)     Height 06/15/23 0923 5\' 8"  (1.727 m)     Head Circumference --      Peak Flow --      Pain Score --      Pain Loc --      Pain Education --      Exclude from Growth Chart --     Most recent vital signs: Vitals:   06/15/23 1100 06/15/23 1130  BP: (!) 98/51 (!) 104/58  Pulse: (!) 44 (!) 48  Resp: 13 11  Temp:    SpO2: 100% 100%    Constitutional: Alert and oriented. Eyes: Conjunctivae are normal. Head: Atraumatic. Nose: No  congestion/rhinnorhea. Mouth/Throat: Mucous membranes are moist.  Cardiovascular: Bradycardic, regular rhythm. Grossly normal heart sounds.  2+ radial pulses bilaterally. Respiratory: Normal respiratory effort.  No retractions. Lungs CTAB.  No chest wall tenderness to palpation. Gastrointestinal: Soft and nontender. No distention. Musculoskeletal: No lower extremity tenderness nor edema.  Neurologic:  Normal speech and language. No gross focal neurologic deficits are appreciated.    ED Results / Procedures / Treatments   Labs (all labs ordered are listed, but only abnormal results are displayed) Labs Reviewed  BASIC METABOLIC PANEL - Abnormal; Notable for the following components:      Result Value   Glucose, Bld 129 (*)    Calcium 8.8 (*)    GFR, Estimated 60 (*)    All other components within normal limits  CBC - Abnormal; Notable for the following components:   HCT 37.5 (*)    RDW 17.1 (*)    All other components within normal limits  TROPONIN I (HIGH SENSITIVITY) - Abnormal; Notable for the following components:   Troponin I (High Sensitivity) 1,055 (*)  All other components within normal limits  TROPONIN I (HIGH SENSITIVITY) - Abnormal; Notable for the following components:   Troponin I (High Sensitivity) 1,324 (*)    All other components within normal limits  HEPARIN LEVEL (UNFRACTIONATED)  APTT  APTT     EKG  ED ECG REPORT I, Chesley Noon, the attending physician, personally viewed and interpreted this ECG.   Date: 06/15/2023  EKG Time: 9:26  Rate: 58  Rhythm: sinus bradycardia  Axis: None  Intervals:nonspecific intraventricular conduction delay  ST&T Change: Lateral ST depressions with no ST elevation  ED ECG REPORT I, Chesley Noon, the attending physician, personally viewed and interpreted this ECG.   Date: 06/15/2023  EKG Time: 10:05  Rate: 54  Rhythm: sinus bradycardia  Axis: Normal  Intervals:nonspecific intraventricular conduction delay   ST&T Change: Lateral ST depressions with no ST elevation, similar to previous   RADIOLOGY Chest x-ray reviewed and interpreted by me with interstitial infiltrates, no effusion noted.  PROCEDURES:  Critical Care performed: Yes, see critical care procedure note(s)  .Critical Care  Performed by: Chesley Noon, MD Authorized by: Chesley Noon, MD   Critical care provider statement:    Critical care time (minutes):  30   Critical care time was exclusive of:  Separately billable procedures and treating other patients and teaching time   Critical care was necessary to treat or prevent imminent or life-threatening deterioration of the following conditions:  Cardiac failure   Critical care was time spent personally by me on the following activities:  Development of treatment plan with patient or surrogate, discussions with consultants, evaluation of patient's response to treatment, examination of patient, ordering and review of laboratory studies, ordering and review of radiographic studies, ordering and performing treatments and interventions, pulse oximetry, re-evaluation of patient's condition and review of old charts   I assumed direction of critical care for this patient from another provider in my specialty: no     Care discussed with: admitting provider      MEDICATIONS ORDERED IN ED: Medications  heparin ADULT infusion 100 units/mL (25000 units/222mL) (1,150 Units/hr Intravenous New Bag/Given 06/15/23 1245)  lactated ringers bolus 1,000 mL (0 mLs Intravenous Stopped 06/15/23 1226)  morphine (PF) 2 MG/ML injection 2 mg (2 mg Intravenous Given 06/15/23 1210)  ondansetron (ZOFRAN) injection 4 mg (4 mg Intravenous Given 06/15/23 1209)     IMPRESSION / MDM / ASSESSMENT AND PLAN / ED COURSE  I reviewed the triage vital signs and the nursing notes.                              89 y.o. male with past medical history of hypertension, hyperlipidemia, CAD, atrial fibrillation on Eliquis,  bradycardia, tremor, and stroke who presents to the ED for discomfort in his chest intermittently earlier this week that has been constant since shortly after he woke up this morning.  Patient's presentation is most consistent with acute presentation with potential threat to life or bodily function.  Differential diagnosis includes, but is not limited to, ACS, PE, dissection, pneumonia, pneumothorax, musculoskeletal pain, GERD, anxiety.  Patient nontoxic-appearing and in no acute distress, vital signs remarkable for bradycardia but otherwise reassuring.  EKG shows sinus bradycardia, he does appear to have new nonspecific intraventricular conduction delay along with new ST depressions laterally but no associated ST elevation.  He was given 324 mg of aspirin prior to arrival, we will give dose of nitroglycerin for his pain.  Repeat  EKG appears similar to previous, no dynamic changes noted.  Troponin and additional labs are pending at this time, anticipate admission to the hospital for high risk chest pain.  Patient with significant improvement in chest pain following dose of nitroglycerin, although he did have drop in his blood pressure.  Pressure now improved following IV fluid bolus, labs remarkable for significantly elevated troponin at greater than 1000.  Additional labs show no significant anemia, leukocytosis, tract abnormality, or AKI.  We will start IV heparin and case discussed with Dr. Duke Salvia of cardiology who will see the patient in consultation.  Chest x-ray shows interstitial infiltrates, possible atypical infection but patient has no symptoms concerning for infectious process.  Patient reported worsening of his chest pain, repeat EKG seems to show increased ST elevation in aVR along with ST depressions laterally.  EKG reviewed with Dr. Herbie Baltimore of cardiology, who states it does not appear consistent with STEMI but given ongoing chest pain and clear NSTEMI, patient may benefit from emergent  catheterization.  After long discussion with family, they would like to proceed with catheterization and further interventions.  Dr. Herbie Baltimore requests that we activate code STEMI and patient to be taken to the Cath Lab.      FINAL CLINICAL IMPRESSION(S) / ED DIAGNOSES   Final diagnoses:  NSTEMI (non-ST elevated myocardial infarction) (HCC)     Rx / DC Orders   ED Discharge Orders     None        Note:  This document was prepared using Dragon voice recognition software and may include unintentional dictation errors.   Chesley Noon, MD 06/15/23 1320

## 2023-06-15 NOTE — Consult Note (Signed)
ANTICOAGULATION CONSULT NOTE - Initial Consult  Pharmacy Consult for heparin infusion Indication: chest pain/ACS, hx of afib on apixaban PTA  Allergies  Allergen Reactions   Penicillins Rash    Rash    Rash    Gabapentin    Penicillin G    Codeine Nausea Only and Nausea And Vomiting    Other reaction(s): Vomiting Other reaction(s): Vomiting    Patient Measurements: Height: 5\' 8"  (172.7 cm) Weight: 82.8 kg (182 lb 9.6 oz) IBW/kg (Calculated) : 68.4 Heparin Dosing Weight: 82.8 kg   Vital Signs: Temp: 98.4 F (36.9 C) (07/13 0929) Temp Source: Oral (07/13 0929) BP: 104/58 (07/13 1130) Pulse Rate: 48 (07/13 1130)  Labs: Recent Labs    06/15/23 0926  HGB 13.1  HCT 37.5*  PLT 163  CREATININE 1.18  TROPONINIHS 1,055*    Estimated Creatinine Clearance: 46.3 mL/min (by C-G formula based on SCr of 1.18 mg/dL).   Medical History: Past Medical History:  Diagnosis Date   Acute embolic stroke (HCC) 02/01/2017   BPH (benign prostatic hyperplasia)    Bradycardia 11/10/2015   Coronary artery disease    Dizziness 08/21/2012   DYSPEPSIA 07/31/2010   Qualifier: Diagnosis of  By: Janee Morn RN, Morrie Sheldon     Hyperlipidemia 05/16/2010   Qualifier: Diagnosis of  By: Mariah Milling MD, [name]      Hypertension    HYPERTENSION, BENIGN 05/16/2010   Qualifier: Diagnosis of  By: Mariah Milling MD, [name]     Stenosis of right carotid artery 02/01/2017    Medications:  Apixaban 5 mg BID - last dose 7/12 PM  Assessment: 89 y.o. male with past medical history of hypertension, hyperlipidemia, stroke, CAD, atrial fibrillation on Eliquis, presented to ED complaining of chest pain. Inititial troponin elevated at 1055. Pharmacy has been consulted to initiate and manage IV heparin therapy.    Baseline: HL 0.47, aPTT 36  Goal of Therapy:  Heparin level 0.3-0.7 units/ml aPTT 66-102 seconds Monitor platelets by anticoagulation protocol: Yes   Plan:  Start heparin infusion at 1150  units/hr Check aPTT level in  8 hours given elevated baseline HL ISO recent DOAC Transition to HL once therapeutic and correlating with aPTT Continue to monitor H&H and platelets  Sharen Hones, PharmD, BCPS Clinical Pharmacist   06/15/2023,12:32 PM

## 2023-06-15 NOTE — ED Notes (Signed)
Pt wife at bedside telling this RN pt has changed his Cardiologist, it is now Dr. Lupita Shutter with [facility].

## 2023-06-15 NOTE — Discharge Summary (Signed)
Physician Discharge Summary  Calvin Shorts [name] ZOX:[id number] DOB: 12/31/1932 DOA: 06/15/2023  PCP: [name], MD  Admit date: 06/15/2023 Discharge date: 06/15/2023  Recommendations for Outpatient Follow-up:  Pt will be transferred to [facility] hospital   Home Health: none Equipment/Devices: none  Discharge Condition: stable CODE STATUS: full code Diet recommendation: heart healthy diet  Brief/Interim Summary (HPI)  Calvin Shorts Quevon Seidl. is a 89 y.o. male with medical history significant of CAD with stent, A-fib on Eliquis, stroke with aphasia, HTN, HLD, gastric ulcer, BPH, small bowel obstruction, bradycardia, dCHF, who presents with chest pain.   Per family at the bedside (his wife, son and daughter), his chest pain started at about 7:30 AM, which is located in the middle and right side of chest, constant, initially severe, currently mild, dull and also sharp pain, nonradiating.  Not associated with shortness of breath.  No cough, fever or chills.  Patient does not have nausea, vomiting, diarrhea or abdominal pain.  No symptoms of UTI.  Patient was given 324 mg of aspirin by EMS. Patient was found to have elevated troponin 1055 --> 1324.  Cardiologist was consulted.  Dr. Herbie Baltimore did urgent cardiac cath.     Cardiac cath by Dr. Herbie Baltimore today:    Anticipated discharge date to be determined.   Upon bed availability, the patient will be transferred to [facility] -> he will be placed under the Team C service   Plan will be to restart IV heparin pending decision about appropriate intervention.  Will need to determine the risks of adding antiplatelet agent to his recently started Eliquis based on his history of stroke and intracerebral hemorrhage.   POST-CATH DIAGNOSES Severe multivessel CAD with unclear culprit lesion:  Patient was chest pain-free, and hemodynamically stable upon completion of the catheterization.  Ostial-proximal LCx subtotal 99 to 100% occlusion  with <TIMI I flow Mild diffuse proximal LAD disease with 70% stenosis after short mid vessel diagonal branch, the large Ramus-like first diagonal with focal 50% stenosis Heavily calcified ostial RCA (very difficult to engage) 95% stenosis followed by diffuse 40 to 60% ISR of the proximal to mid vessel stent followed by 99 % subtotal occlusion after the stent crossing major RV marginal branch. Brisk collaterals fill the PDA   Moderate reduced EF with basal to mid inferior essentially akinesis and inferolateral hypokinesis. With an EDP of 17 mmHg.   RECOMMENDATIONS Admit and restart IV heparin 6 hours after Mynx closure High-dose high intensity statin No beta-blocker because history of tachybradycardia With the extent of disease he has, and either the circumflex or the RCA lesion being somewhat difficult I chose to delay attempted intervention.  Would recommend Transfer to [facility] to discuss possible atherectomy PCI of the RCA plus or minus attempting PCI of the circumflex.  Will need discussion between IC cardiologist and possibly CVTS given the fact that there does appear to be LAD lesion as well. As he was chest pain-free and hemodynamically stable, I did not place balloon pump.   Data reviewed independently and ED Course: pt was found to have WBC 7.2, stable renal function with a GFR 60, temperature normal, soft blood pressure 77/54 --> 107/61, heart rate 48, 56, RR 16, oxygen saturation 96-100% on room air.  Chest x-ray showed increased interstitial opacity.  Patient is admitted to stepdown as inpatient.     EKG: I have personally reviewed.  Sinus rhythm, QTc 446, LAD, low voltage, poor R wave progression, ST elevation in aVR,  ST depression in precordial leads and lateral leads.    Discharge Diagnoses and Hospital Course:   Principal Problem:   NSTEMI (non-ST elevated myocardial infarction) (HCC) Active Problems:   CAD S/P percutaneous coronary angioplasty   Chronic diastolic  CHF (congestive heart failure) (HCC)   Atrial fibrillation, chronic (HCC)   Hyperlipidemia   HYPERTENSION, BENIGN   History of CVA (cerebrovascular accident)   BPH (benign prostatic hyperplasia)   Gastric ulcer   Assessment and Plan:   NSTEMI (non-ST elevated myocardial infarction) (HCC) and hx of CAD with stent placement: trop  1055 --> 1324.  Dr. Herbie Baltimore of cardiology did urgent cardiac cath, which showed severe multivessel CAD, no stent placed.  Per Dr. Herbie Baltimore, patient will need to be transferred to [facility] for further treatment.  Dr. Lalla Brothers accepted patient.  Patient will be transferred to [facility] stepdown.  I already called [facility].  Currently patient is chest pain-free.  Patient had hypotension initially with blood pressure 77/54, which improved to 107/61.  Patient received 1 L LR bolus.   - admit to ICU in [facility] as inpatient --> will transfer to [facility] - continue IV heparin - prn Morphine for pain - Aspirin 81 mg daily per Dr. Elissa Hefty order - increase lipitor dose to 80 mg daily - Risk factor stratification: will check FLP and A1C  - 2d echo   Chronic diastolic CHF (congestive heart failure) (HCC): 2D echo on 02/25/2020 showed EF of 55% with grade 1 diastolic dysfunction.  Patient denies shortness breath.  No leg edema.  CHF is compensated.  Patient's not taking Lasix currently. -Checked BNP --> 391   Atrial fibrillation, chronic (HCC): Currently sinus rhythm, with bradycardia. -Patient is on IV heparin -Hold Eliquis   Hyperlipidemia -Lipitor   HYPERTENSION, BENIGN: Blood pressure soft -Hold lisinopril -IV hydralazine as needed   History of CVA (cerebrovascular accident) -Lipitor -Hold Eliquis   BPH (benign prostatic hyperplasia) -Continue Flomax   Gastric ulcer -Will increase Protonix dose to 40 mg twice daily since patient is started on aspirin   Peripheral neuropathy -Continue home Lyrica         Discharge Instructions  Discharge  Instructions     AMB referral to Phase II Cardiac Rehabilitation   Complete by: As directed    Pending decision re PCI vs. CABG   Diagnosis:  NSTEMI Other     After initial evaluation and assessments completed: Virtual Based Care may be provided alone or in conjunction with Phase 2 Cardiac Rehab based on patient barriers.: Yes   Intensive Cardiac Rehabilitation (ICR) [facility] location only OR Traditional Cardiac Rehabilitation (TCR) *If criteria for ICR are not met will enroll in TCR [facility] only): Yes      Allergies as of 06/15/2023       Reactions   Penicillins Rash   Rash Rash   Gabapentin    Penicillin G    Codeine Nausea Only, Nausea And Vomiting   Other reaction(s): Vomiting Other reaction(s): Vomiting     Med Rec must be completed prior to using this [facility]       Allergies  Allergen Reactions   Penicillins Rash    Rash    Rash    Gabapentin    Penicillin G    Codeine Nausea Only and Nausea And Vomiting    Other reaction(s): Vomiting Other reaction(s): Vomiting    Consultations: cardiology   Procedures/Studies: CARDIAC CATHETERIZATION  Result Date: 06/15/2023   Ost Cx to Prox Cx lesion is 100%  stenosed -> the following circumflex fills berry but appears to be bridging collaterals.  Prox Cx to Mid Cx lesion is 80% stenosed.   Ost RCA to Prox RCA lesion is 95% stenosed.   Prox RCA stent has 55% in-stent restenosis   Prox RCA to Mid RCA lesion is 99% stenosed. Mid RCA lesion is 70% stenosed.   Dist RCA lesion is 99% stenosed.  TIMI I flow to the distal vessel.   1st Diag lesion is 55% stenosed.   Mid LAD lesion is 65% stenosed.   -----------------------------------   There is mild to moderate left ventricular systolic dysfunction.  The left ventricular ejection fraction is 35-45% by visual estimate. Basal to mid inferior akinesis with inferolateral hypokinesis.  LV end diastolic pressure is mildly elevated.   There is mild aortic valve stenosis.    -----------------------------------   Upon bed availability, the patient will be transferred to [facility] -> he will be placed under the Team C service   Plan will be to restart IV heparin pending decision about appropriate intervention.  Will need to determine the risks of adding antiplatelet agent to his recently started Eliquis based on his history of stroke and intracerebral hemorrhage. POST-CATH DIAGNOSES Severe multivessel CAD with unclear culprit lesion:  Patient was chest pain-free, and hemodynamically stable upon completion of the catheterization. Ostial-proximal LCx subtotal 99 to 100% occlusion with <TIMI I flow Mild diffuse proximal LAD disease with 70% stenosis after short mid vessel diagonal branch, the large Ramus-like first diagonal with focal 50% stenosis Heavily calcified ostial RCA (very difficult to engage) 95% stenosis followed by diffuse 40 to 60% ISR of the proximal to mid vessel stent followed by 99 % subtotal occlusion after the stent crossing major RV marginal branch. Brisk collaterals fill the PDA Moderate reduced EF with basal to mid inferior essentially akinesis and inferolateral hypokinesis. With an EDP of 17 mmHg. RECOMMENDATIONS Admit and restart IV heparin 6 hours after Mynx closure High-dose high intensity statin No beta-blocker because history of tachybradycardia With the extent of disease he has, and either the circumflex or the RCA lesion being somewhat difficult I chose to delay attempted intervention.  Would recommend Transfer to [facility] to discuss possible atherectomy PCI of the RCA plus or minus attempting PCI of the circumflex.  Will need discussion between IC cardiologist and possibly CVTS given the fact that there does appear to be LAD lesion as well. As he was chest pain-free and hemodynamically stable, I did not place balloon pump. Bryan Lemma, MD  DG Chest Port 1 View  Result Date: 06/15/2023 CLINICAL DATA:  Chest pain. EXAM: PORTABLE CHEST 1  VIEW COMPARISON:  Chest x-ray dated January 08, 2023. FINDINGS: The heart remains at the upper limits of normal in size. Loop recorder again noted. Increased interstitial opacities compared to prior, more prominent on the right. No focal consolidation, pleural effusion, or pneumothorax. No acute osseous abnormality. IMPRESSION: 1. Increased interstitial opacities compared to prior, more prominent on the right, concerning for atypical infection. Electronically Signed   By: Obie Dredge M.D.   On: 06/15/2023 10:26      Discharge Exam: Vitals:   06/15/23 1130 06/15/23 1515  BP: (!) 104/58 117/83  Pulse: (!) 48 70  Resp: 11 19  Temp:    SpO2: 100% (!) 74%   Vitals:   06/15/23 1030 06/15/23 1100 06/15/23 1130 06/15/23 1515  BP: (!) 77/54 (!) 98/51 (!) 104/58 117/83  Pulse: (!) 46 (!) 44 (!) 48  70  Resp: 13 13 11 19   Temp:      TempSrc:      SpO2: 96% 100% 100% (!) 74%  Weight:      Height:        General: Not in acute distress HEENT:       Eyes: PERRL, EOMI, no scleral icterus.       ENT: No discharge from the ears and nose, no pharynx injection, no tonsillar enlargement.        Neck: No JVD, no bruit, no mass felt. Heme: No neck lymph node enlargement. Cardiac: S1/S2, RRR, No murmurs, No gallops or rubs. Respiratory: No rales, wheezing, rhonchi or rubs. GI: Soft, nondistended, nontender, no rebound pain, no organomegaly, BS present. GU: No hematuria Ext: No pitting leg edema bilaterally. 1+DP/PT pulse bilaterally. Musculoskeletal: No joint deformities, No joint redness or warmth, no limitation of ROM in spin. Skin: No rashes.  Neuro: Alert, oriented X3, cranial nerves II-XII grossly intact. Psych: Patient is not psychotic, no suicidal or hemocidal ideation.     The results of significant diagnostics from this hospitalization (including imaging, microbiology, ancillary and laboratory) are listed below for reference.     Microbiology: No results found for this or any  previous visit (from the past 240 hour(s)).   Labs: BNP (last 3 results) Recent Labs    06/15/23 0926  BNP 391.9*   Basic Metabolic Panel: Recent Labs  Lab 06/15/23 0926  NA 135  K 4.5  CL 102  CO2 25  GLUCOSE 129*  BUN 21  CREATININE 1.18  CALCIUM 8.8*   Liver Function Tests: No results for input(s): "AST", "ALT", "ALKPHOS", "BILITOT", "PROT", "ALBUMIN" in the last 168 hours. No results for input(s): "LIPASE", "AMYLASE" in the last 168 hours. No results for input(s): "AMMONIA" in the last 168 hours. CBC: Recent Labs  Lab 06/15/23 0926  WBC 7.2  HGB 13.1  HCT 37.5*  MCV 88.0  PLT 163   Cardiac Enzymes: No results for input(s): "CKTOTAL", "CKMB", "CKMBINDEX", "TROPONINI" in the last 168 hours. BNP: Invalid input(s): "POCBNP" CBG: Recent Labs  Lab 06/15/23 1516  GLUCAP 91   D-Dimer No results for input(s): "DDIMER" in the last 72 hours. Hgb A1c No results for input(s): "HGBA1C" in the last 72 hours. Lipid Profile No results for input(s): "CHOL", "HDL", "LDLCALC", "TRIG", "CHOLHDL", "LDLDIRECT" in the last 72 hours. Thyroid function studies No results for input(s): "TSH", "T4TOTAL", "T3FREE", "THYROIDAB" in the last 72 hours.  Invalid input(s): "FREET3" Anemia work up No results for input(s): "VITAMINB12", "FOLATE", "FERRITIN", "TIBC", "IRON", "RETICCTPCT" in the last 72 hours. Urinalysis    Component Value Date/Time   COLORURINE YELLOW (A) 01/08/2023 0027   APPEARANCEUR CLEAR (A) 01/08/2023 0027   APPEARANCEUR Clear 05/29/2018 1049   LABSPEC 1.017 01/08/2023 0027   LABSPEC 1.011 09/13/2012 1609   PHURINE 5.0 01/08/2023 0027   GLUCOSEU NEGATIVE 01/08/2023 0027   GLUCOSEU Negative 09/13/2012 1609   HGBUR NEGATIVE 01/08/2023 0027   BILIRUBINUR NEGATIVE 01/08/2023 0027   BILIRUBINUR Negative 05/29/2018 1049   BILIRUBINUR Negative 09/13/2012 1609   KETONESUR 5 (A) 01/08/2023 0027   PROTEINUR NEGATIVE 01/08/2023 0027   NITRITE NEGATIVE 01/08/2023  0027   LEUKOCYTESUR NEGATIVE 01/08/2023 0027   LEUKOCYTESUR Negative 09/13/2012 1609   Sepsis Labs Recent Labs  Lab 06/15/23 0926  WBC 7.2   Microbiology No results found for this or any previous visit (from the past 240 hour(s)).  Time coordinating discharge:  25 minutes.    SIGNED:  Lorretta Harp, MD [facility] 06/15/2023, 4:37 PM   If 7PM-7AM, please contact night-coverage [email or web address]

## 2023-06-15 NOTE — H&P (Signed)
Cardiology Admission History and Physical   Patient ID: [name]. MRN: [id number]; DOB: [birth year]   Admission date: 06/15/2023  PCP:  Calvin Arbour, MD   [facility] Providers Cardiologist:  [name], MD        Chief Complaint:  Chest pain  Patient Profile:   [name]. is a 89 y.o. male with a significant past medical history of coronary artery disease status post stent to RCA (self-reported 2007), paroxysmal atrial fibrillation on oral anticoagulation, prior cerebrovascular accident now with aphasia, hypertension, documented heart failure with preserved ejection fraction, hypercholesteremia, gastric ulcer bleeding (February 2024), prostate hypertrophy,  who is being seen 06/15/2023 for the evaluation of NSTEMI-ACS.  History of Present Illness:   [name] was sent to [facility] by emergency medical services earlier this afternoon for chest pain.  Patient's wife provided a majority of history given patient's post-cerebrovascular accident aphasia.  He woke up this morning reporting chest pain that was at the center of his chest and non-radiating.  He is unable to describe the pain further by wife states he reported sharp chest pain.  He also reports feeling weak but no other symptoms such as shortness of breath, nausea, or palpitations.  He is now reporting a 1/10 chest pain without any other symptoms.  Per wife, he has been having off and on chest pain that was not as severe for the past week.    At [facility], he had an initial HS-troponin that was 1,055 ng/L and went up to 1,324 ng/L.  ECG demonstrated non-specific ST depressions and T-wave abnormality.  Cardiology was consulted and patient was taken for coronary angiography by Dr. Bryan Lemma.  Coronary angiography revealed extensive coronary artery disease (please see report below).  He was transferred to our facility for complex  percutaneous intervention consideration.     Past Medical History:  Diagnosis Date   Acute embolic stroke (HCC) 02/01/2017   BPH (benign prostatic hyperplasia)    Bradycardia 11/10/2015   Coronary artery disease    Dizziness 08/21/2012   DYSPEPSIA 07/31/2010   Qualifier: Diagnosis of  By: Janee Morn RN, Morrie Sheldon     Hyperlipidemia 05/16/2010   Qualifier: Diagnosis of  By: Mariah Milling MD, [name]      Hypertension    HYPERTENSION, BENIGN 05/16/2010   Qualifier: Diagnosis of  By: Mariah Milling MD, [name]     Stenosis of right carotid artery 02/01/2017    Past Surgical History:  Procedure Laterality Date   ESOPHAGOGASTRODUODENOSCOPY (EGD) WITH PROPOFOL N/A 01/08/2023   Procedure: ESOPHAGOGASTRODUODENOSCOPY (EGD) WITH PROPOFOL;  Surgeon: Midge Minium, MD;  Location: [facility] ENDOSCOPY;  Service: Endoscopy;  Laterality: N/A;   HIP SURGERY     right hip    Intestinal Blockage     LOOP RECORDER INSERTION N/A 05/14/2017   Procedure: Loop Recorder Insertion;  Surgeon: Duke Salvia, MD;  Location: [facility] INVASIVE CV LAB;  Service: Cardiovascular;  Laterality: N/A;   SKIN CANCER EXCISION     x2   TONSILLECTOMY       Medications Prior to Admission: Prior to Admission medications   Medication Sig Start Date End Date Taking? Authorizing Provider  acetaminophen (TYLENOL) 650 MG CR tablet Take 650 mg by mouth every 8 (eight) hours as needed.     [provider]  apixaban (ELIQUIS) 5 MG TABS tablet Take 1 tablet (5 mg total) by mouth 2 (two) times daily. Patient taking differently: Take 2.5 mg by mouth  2 (two) times daily. 04/23/23   Antonieta Iba, MD  Ascorbic Acid (VITAMIN C) 1000 MG tablet Take 1,000 mg by mouth daily.    [provider]  aspirin 81 MG tablet Take 1 tablet (81 mg total) by mouth daily. Patient not taking: Reported on 06/15/2023 12/12/18   Antonieta Iba, MD  atorvastatin (LIPITOR) 20 MG tablet Take 20 mg by mouth daily. 04/30/23 04/29/24  [provider]  b complex vitamins  tablet Take 1 tablet by mouth daily.    [provider]  cholecalciferol (VITAMIN D) 1000 units tablet Take 2,000 Units by mouth daily.    [provider]  cyanocobalamin (VITAMIN B12) 1000 MCG tablet Take 1,000 mcg by mouth daily.    [provider]  docusate sodium (COLACE) 100 MG capsule Take 100 mg by mouth 2 (two) times daily.    [provider]  ezetimibe (ZETIA) 10 MG tablet Take 1 tablet (10 mg total) by mouth daily. 08/03/22   Antonieta Iba, MD  FLAXSEED, LINSEED, PO Take 2 g by mouth daily.    [provider]  furosemide (LASIX) 20 MG tablet Take 1 tablet (20 mg total) by mouth every other day. Patient not taking: Reported on 06/15/2023 04/23/23 07/22/23  Antonieta Iba, MD  lisinopril (ZESTRIL) 10 MG tablet Take 10 mg by mouth daily. 08/31/22   [provider]  nitroGLYCERIN (NITROSTAT) 0.4 MG SL tablet Place 1 tablet (0.4 mg total) under the tongue every 5 (five) minutes as needed for chest pain. 08/03/22   Antonieta Iba, MD  pantoprazole (PROTONIX) 40 MG tablet Take 1 tablet (40 mg total) by mouth 2 (two) times daily. Patient taking differently: Take 40 mg by mouth daily. 01/11/23 06/15/23  Willeen Niece, MD  potassium chloride (KLOR-CON) 10 MEQ tablet Take 1 tablet (10 mEq total) by mouth every other day. Patient not taking: Reported on 06/15/2023 04/23/23 07/22/23  Antonieta Iba, MD  pregabalin (LYRICA) 100 MG capsule Take 100 mg by mouth in the morning. 05/23/22   [provider]  pregabalin (LYRICA) 150 MG capsule Take 150 mg by mouth at bedtime. 05/23/22   [provider]  Probiotic Product (UP4 PROBIOTICS PO) Take 7.5 mg by mouth.    [provider]  simvastatin (ZOCOR) 40 MG tablet Take 1 tablet (40 mg total) by mouth every evening. Patient not taking: Reported on 06/15/2023 08/03/22   Antonieta Iba, MD  tamsulosin (FLOMAX) 0.4 MG CAPS capsule Take 2 capsules (0.8 mg total) by mouth daily.  04/21/19   Vanna Scotland, MD  zinc sulfate 220 (50 Zn) MG capsule Take 1 tablet by mouth daily. 07/19/22   [provider]     Allergies:    Allergies  Allergen Reactions   Penicillins Rash    Rash    Rash    Gabapentin    Penicillin G    Codeine Nausea Only and Nausea And Vomiting    Other reaction(s): Vomiting Other reaction(s): Vomiting    Social History:   Social History   Socioeconomic History   Marital status: Married    Spouse name: Not on file   Number of children: Not on file   Years of education: Not on file   Highest education level: Not on file  Occupational History   Not on file  Tobacco Use   Smoking status: Never   Smokeless tobacco: Never  Vaping Use   Vaping status: Never Used  Substance and Sexual  Activity   Alcohol use: Yes    Comment: occassionally   Drug use: No   Sexual activity: Not on file  Other Topics Concern   Not on file  Social History Narrative   Not on file   Social Determinants of Health   Financial Resource Strain: Not on file  Food Insecurity: No Food Insecurity (01/08/2023)   Hunger Vital Sign    Worried About Running Out of Food in the Last Year: Never true    Ran Out of Food in the Last Year: Never true  Transportation Needs: No Transportation Needs (01/08/2023)   PRAPARE - Administrator, Civil Service (Medical): No    Lack of Transportation (Non-Medical): No  Physical Activity: Not on file  Stress: Not on file  Social Connections: Not on file  Intimate Partner Violence: Not At Risk (01/08/2023)   Humiliation, Afraid, Rape, and Kick questionnaire    Fear of Current or Ex-Partner: No    Emotionally Abused: No    Physically Abused: No    Sexually Abused: No    Family History:   The patient's family history includes Cancer in an other family member; Hyperlipidemia in an other family member; Hypertension in his mother and another family member; Other in an other family member.    ROS:  Please see  the history of present illness.  All other ROS reviewed and negative.     Physical Exam/Data:   Vitals:   06/15/23 1849 06/15/23 1951  BP: 96/66 95/70  Pulse: (!) 55 60  Resp: 14 16  Temp: 98.2 F (36.8 C) 98.4 F (36.9 C)  TempSrc: Oral Oral  SpO2: 97% 90%   No intake or output data in the 24 hours ending 06/15/23 2136    06/15/2023    9:23 AM 05/11/2023   11:47 AM 04/23/2023    9:07 AM  Last 3 Weights  Weight (lbs) 182 lb 9.6 oz 182 lb 1.6 oz 182 lb 4 oz  Weight (kg) 82.827 kg 82.6 kg 82.668 kg     There is no height or weight on file to calculate BMI.  General:  Well nourished, well developed, in no acute distress HEENT: normal Neck: no JVD Vascular: No carotid bruits; Distal pulses 2+ bilaterally   Cardiac:  normal S1, S2; RRR; no murmur  Lungs:  clear to auscultation bilaterally, no wheezing, rhonchi or rales  Abd: soft, nontender, no hepatomegaly  Ext: no edema Musculoskeletal:  No deformities, BUE and BLE strength normal and equal Skin: warm and dry  Neuro:  CNs 2-12 intact, no focal abnormalities noted Psych:  Normal affect    EKG:  The ECG that was done and was personally reviewed and demonstrates: 06/15/23 (11:35 hrs):  NSR; IVCD; non-specific ST and T-wave changes; cannot rule out septal infarction  Relevant CV Studies: # Coronary angiography 06/15/23:   Suezanne Jacquet Cx to Prox Cx lesion is 100% stenosed -> the following circumflex fills berry but appears to be bridging collaterals.  Prox Cx to Mid Cx lesion is 80% stenosed.   Ost RCA to Prox RCA lesion is 95% stenosed.   Prox RCA stent has 55% in-stent restenosis   Prox RCA to Mid RCA lesion is 99% stenosed. Mid RCA lesion is 70% stenosed.   Dist RCA lesion is 99% stenosed.  TIMI I flow to the distal vessel.   1st Diag lesion is 55% stenosed.   Mid LAD lesion is 65% stenosed.   -----------------------------------   There is mild to  moderate left ventricular systolic dysfunction.  The left ventricular ejection  fraction is 35-45% by visual estimate. Basal to mid inferior akinesis with inferolateral hypokinesis.  LV end diastolic pressure is mildly elevated.   There is mild aortic valve stenosis.  # Echocardiogram 02/25/20: IMPRESSIONS   1. Left ventricular ejection fraction, by estimation, is 55%. The left  ventricle has normal function. The left ventricle has no regional wall  motion abnormalities. Left ventricular diastolic parameters are consistent  with Grade I diastolic dysfunction  (impaired relaxation).   2. Right ventricular systolic function is normal. The right ventricular  size is normal. There is normal pulmonary artery systolic pressure.   3. Mild mitral valve regurgitation.   4. The aortic valve is normal in structure. Aortic valve regurgitation is  mild. Mild to moderate aortic valve sclerosis/calcification is present,  with borderline mild aortic stenosis. Aortic valve mean gradient measures  10.0 mmHg.   Laboratory Data:  High Sensitivity Troponin:   Recent Labs  Lab 06/15/23 0926 06/15/23 1214  TROPONINIHS 1,055* 1,324*      Chemistry Recent Labs  Lab 06/15/23 0926  NA 135  K 4.5  CL 102  CO2 25  GLUCOSE 129*  BUN 21  CREATININE 1.18  CALCIUM 8.8*  GFRNONAA 60*  ANIONGAP 8    No results for input(s): "PROT", "ALBUMIN", "AST", "ALT", "ALKPHOS", "BILITOT" in the last 168 hours. Lipids No results for input(s): "CHOL", "TRIG", "HDL", "LABVLDL", "LDLCALC", "CHOLHDL" in the last 168 hours. Hematology Recent Labs  Lab 06/15/23 0926  WBC 7.2  RBC 4.26  HGB 13.1  HCT 37.5*  MCV 88.0  MCH 30.8  MCHC 34.9  RDW 17.1*  PLT 163   Thyroid No results for input(s): "TSH", "FREET4" in the last 168 hours. BNP Recent Labs  Lab 06/15/23 0926  BNP 391.9*    DDimer No results for input(s): "DDIMER" in the last 168 hours.   Radiology/Studies:  CARDIAC CATHETERIZATION  Result Date: 06/15/2023   Suezanne Jacquet Cx to Prox Cx lesion is 100% stenosed -> the following  circumflex fills berry but appears to be bridging collaterals.  Prox Cx to Mid Cx lesion is 80% stenosed.   Ost RCA to Prox RCA lesion is 95% stenosed.   Prox RCA stent has 55% in-stent restenosis   Prox RCA to Mid RCA lesion is 99% stenosed. Mid RCA lesion is 70% stenosed.   Dist RCA lesion is 99% stenosed.  TIMI I flow to the distal vessel.   1st Diag lesion is 55% stenosed.   Mid LAD lesion is 65% stenosed.   -----------------------------------   There is mild to moderate left ventricular systolic dysfunction.  The left ventricular ejection fraction is 35-45% by visual estimate. Basal to mid inferior akinesis with inferolateral hypokinesis.  LV end diastolic pressure is mildly elevated.   There is mild aortic valve stenosis.   -----------------------------------   Upon bed availability, the patient will be transferred to [facility] -> he will be placed under the Team C service   Plan will be to restart IV heparin pending decision about appropriate intervention.  Will need to determine the risks of adding antiplatelet agent to his recently started Eliquis based on his history of stroke and intracerebral hemorrhage. POST-CATH DIAGNOSES Severe multivessel CAD with unclear culprit lesion:  Patient was chest pain-free, and hemodynamically stable upon completion of the catheterization. Ostial-proximal LCx subtotal 99 to 100% occlusion with <TIMI I flow Mild diffuse proximal LAD disease with 70% stenosis after short mid vessel  diagonal branch, the large Ramus-like first diagonal with focal 50% stenosis Heavily calcified ostial RCA (very difficult to engage) 95% stenosis followed by diffuse 40 to 60% ISR of the proximal to mid vessel stent followed by 99 % subtotal occlusion after the stent crossing major RV marginal branch. Brisk collaterals fill the PDA Moderate reduced EF with basal to mid inferior essentially akinesis and inferolateral hypokinesis. With an EDP of 17 mmHg. RECOMMENDATIONS Admit and restart IV  heparin 6 hours after Mynx closure High-dose high intensity statin No beta-blocker because history of tachybradycardia With the extent of disease he has, and either the circumflex or the RCA lesion being somewhat difficult I chose to delay attempted intervention.  Would recommend Transfer to [facility] to discuss possible atherectomy PCI of the RCA plus or minus attempting PCI of the circumflex.  Will need discussion between IC cardiologist and possibly CVTS given the fact that there does appear to be LAD lesion as well. As he was chest pain-free and hemodynamically stable, I did not place balloon pump. Bryan Lemma, MD  DG Chest Port 1 View  Result Date: 06/15/2023 CLINICAL DATA:  Chest pain. EXAM: PORTABLE CHEST 1 VIEW COMPARISON:  Chest x-ray dated January 08, 2023. FINDINGS: The heart remains at the upper limits of normal in size. Loop recorder again noted. Increased interstitial opacities compared to prior, more prominent on the right. No focal consolidation, pleural effusion, or pneumothorax. No acute osseous abnormality. IMPRESSION: 1. Increased interstitial opacities compared to prior, more prominent on the right, concerning for atypical infection. Electronically Signed   By: Obie Dredge M.D.   On: 06/15/2023 10:26     Assessment and Plan:   NSTEMI-ACS: Patient with acute chest pain in the setting of a rising HS-troponin, fitting definition for NSTEMI-ACS.  ECG without non-specific findings.  Patient is now reporting 1/10 chest pain but hemodynamically stable.  Urgent coronary angiography revealed extensive multivessel disease at [facility].  He was placed on heparin drip and given high-dose aspirin.  Home apixaban is being held.   -- Restart heparin drip. -- Continue aspirin 81 mg daily. -- Hold apixaban. -- Cardiology team to discuss next steps for revascularization.   -- Get an echocardiogram. -- Check lipid panel. -- Start atorvastatin 40 mg daily and  discontinue home simvastatin.    Atrial fibrillation: Paroxysmal atrial fibrillation now with noted sinus rhythm.  He has a CHA2DS2 VASc 6, making patient high-risk for thrombo-embolism.  Patient was taking apixaban prior, now on heparin drip. -- Continue heparin drip. -- It does not appear patient was taking a rate controlling agent prior, will monitor on telemetry.    Hypertension: Primary hypertension without overt end-organ damage.  Blood pressure at this time is controlled.  Patient takes lisinopril 10 mg daily and furosemide 20 mg every other day at home. -- Continue home lisinopril. -- Hold home furosemide.    Prior cerebrovascular accident: -- Continue aspirin as above. -- Start atorvastatin.   -- Heparin drip initiated for thrombo-embolism prophylaxis.    Risk Assessment/Risk Scores:    TIMI Risk Score for Unstable Angina or Non-ST Elevation MI:   The patient's TIMI risk score is 6, which indicates a 41% risk of all cause mortality, new or recurrent myocardial infarction or need for urgent revascularization in the next 14 days.    CHA2DS2-VASc Score = 6   This indicates a 9.7% annual risk of stroke. The patient's score is based upon: CHF History: 0 HTN History: 1 Diabetes History:  0 Stroke History: 2 Vascular Disease History: 1 Age Score: 2 Gender Score: 0     Code Status: Full Code  Severity of Illness: The appropriate patient status for this patient is INPATIENT. Inpatient status is judged to be reasonable and necessary in order to provide the required intensity of service to ensure the patient's safety. The patient's presenting symptoms, physical exam findings, and initial radiographic and laboratory data in the context of their chronic comorbidities is felt to place them at high risk for further clinical deterioration. Furthermore, it is not anticipated that the patient will be medically stable for discharge from the hospital within 2 midnights of admission.   *  I certify that at the point of admission it is my clinical judgment that the patient will require inpatient hospital care spanning beyond 2 midnights from the point of admission due to high intensity of service, high risk for further deterioration and high frequency of surveillance required.*   For questions or updates, please contact [facility] Please consult [email or web address] for contact info under     Signed, Judie Grieve, MD  06/15/2023 9:36 PM

## 2023-06-15 NOTE — ED Triage Notes (Signed)
Pt via [facility] from home. Pt c/o CP since this AM. Pt has a hx of hemorrhagic stroke and cardiac stent placement in 2007. Pt is unable to describe pain. Pt has a cognitive delay. Pt is alert and oriented to only self.  EMS gave 324 ASA

## 2023-06-15 NOTE — H&P (Addendum)
History and Physical    [employer] ZOX:[id number] DOB: 04-24-1933 DOA: 06/15/2023  Referring MD/NP/PA:   PCP: [name], MD   Patient coming from:  The patient is coming from home.     Chief Complaint: chest pain  HPI: [name]. is a 89 y.o. male with medical history significant of CAD with stent, A-fib on Eliquis, stroke with aphasia, HTN, HLD, gastric ulcer, BPH, small bowel obstruction, bradycardia, dCHF, who presents with chest pain.  Per family at the bedside (his wife, son and daughter), his chest pain started at about 7:30 AM, which is located in the middle and right side of chest, constant, initially severe, currently mild, dull and also sharp pain, nonradiating.  Not associated with shortness of breath.  No cough, fever or chills.  Patient does not have nausea, vomiting, diarrhea or abdominal pain.  No symptoms of UTI.  Patient was given 324 mg of aspirin by EMS. Patient was found to have elevated troponin 1055 --> 1324.  Cardiologist was consulted.  Dr. Herbie Baltimore did urgent cardiac cath.   Cardiac cath by Dr. Herbie Baltimore today:    Anticipated discharge date to be determined.   Upon bed availability, the patient will be transferred to [facility] -> he will be placed under the Team C service   Plan will be to restart IV heparin pending decision about appropriate intervention.  Will need to determine the risks of adding antiplatelet agent to his recently started Eliquis based on his history of stroke and intracerebral hemorrhage.   POST-CATH DIAGNOSES Severe multivessel CAD with unclear culprit lesion:  Patient was chest pain-free, and hemodynamically stable upon completion of the catheterization.  Ostial-proximal LCx subtotal 99 to 100% occlusion with <TIMI I flow Mild diffuse proximal LAD disease with 70% stenosis after short mid vessel diagonal branch, the large Ramus-like first diagonal with focal 50% stenosis Heavily calcified ostial  RCA (very difficult to engage) 95% stenosis followed by diffuse 40 to 60% ISR of the proximal to mid vessel stent followed by 99 % subtotal occlusion after the stent crossing major RV marginal branch. Brisk collaterals fill the PDA   Moderate reduced EF with basal to mid inferior essentially akinesis and inferolateral hypokinesis. With an EDP of 17 mmHg.   RECOMMENDATIONS Admit and restart IV heparin 6 hours after Mynx closure High-dose high intensity statin No beta-blocker because history of tachybradycardia With the extent of disease he has, and either the circumflex or the RCA lesion being somewhat difficult I chose to delay attempted intervention.  Would recommend Transfer to [facility] to discuss possible atherectomy PCI of the RCA plus or minus attempting PCI of the circumflex.  Will need discussion between IC cardiologist and possibly CVTS given the fact that there does appear to be LAD lesion as well. As he was chest pain-free and hemodynamically stable, I did not place balloon pump.   Data reviewed independently and ED Course: pt was found to have WBC 7.2, stable renal function with a GFR 60, temperature normal, soft blood pressure 77/54 --> 107/61, heart rate 48, 56, RR 16, oxygen saturation 96-100% on room air.  Chest x-ray showed increased interstitial opacity.  Patient is admitted to stepdown as inpatient.   EKG: I have personally reviewed.  Sinus rhythm, QTc 446, LAD, low voltage, poor R wave progression, ST elevation in aVR, ST depression in precordial leads and lateral leads.   Review of Systems:   General: no fevers, chills, no body weight  gain, has fatigue HEENT: no blurry vision, hearing changes or sore throat Respiratory: no dyspnea, coughing, wheezing CV: has chest pain, no palpitations GI: no nausea, vomiting, abdominal pain, diarrhea, constipation GU: no dysuria, burning on urination, increased urinary frequency, hematuria  Ext: no leg edema Neuro: no  unilateral weakness, numbness, or tingling, no vision change or hearing loss. Has mild aphasia from a previous stroke Skin: no rash, no skin tear. MSK: No muscle spasm, no deformity, no limitation of range of movement in spin Heme: No easy bruising.  Travel history: No recent long distant travel.   Allergy:  Allergies  Allergen Reactions   Penicillins Rash    Rash    Rash    Gabapentin    Penicillin G    Codeine Nausea Only and Nausea And Vomiting    Other reaction(s): Vomiting Other reaction(s): Vomiting    Past Medical History:  Diagnosis Date   Acute embolic stroke (HCC) 02/01/2017   BPH (benign prostatic hyperplasia)    Bradycardia 11/10/2015   Coronary artery disease    Dizziness 08/21/2012   DYSPEPSIA 07/31/2010   Qualifier: Diagnosis of  By: Janee Morn RN, Morrie Sheldon     Hyperlipidemia 05/16/2010   Qualifier: Diagnosis of  By: Mariah Milling MD, [name]      Hypertension    HYPERTENSION, BENIGN 05/16/2010   Qualifier: Diagnosis of  By: Mariah Milling MD, [name]     Stenosis of right carotid artery 02/01/2017    Past Surgical History:  Procedure Laterality Date   ESOPHAGOGASTRODUODENOSCOPY (EGD) WITH PROPOFOL N/A 01/08/2023   Procedure: ESOPHAGOGASTRODUODENOSCOPY (EGD) WITH PROPOFOL;  Surgeon: Midge Minium, MD;  Location: [facility] ENDOSCOPY;  Service: Endoscopy;  Laterality: N/A;   HIP SURGERY     right hip    Intestinal Blockage     LOOP RECORDER INSERTION N/A 05/14/2017   Procedure: Loop Recorder Insertion;  Surgeon: Duke Salvia, MD;  Location: [facility] INVASIVE CV LAB;  Service: Cardiovascular;  Laterality: N/A;   SKIN CANCER EXCISION     x2   TONSILLECTOMY      Social History:  reports that he has never smoked. He has never used smokeless tobacco. He reports current alcohol use. He reports that he does not use drugs.  Family History:  Family History  Problem Relation Age of Onset   Hypertension Mother    Cancer Other    Hyperlipidemia Other    Hypertension Other    Other Other         CHF     Prior to Admission medications   Medication Sig Start Date End Date Taking? Authorizing Provider  acetaminophen (TYLENOL) 650 MG CR tablet Take 650 mg by mouth every 8 (eight) hours as needed.    Yes [provider]  apixaban (ELIQUIS) 5 MG TABS tablet Take 1 tablet (5 mg total) by mouth 2 (two) times daily. Patient taking differently: Take 2.5 mg by mouth 2 (two) times daily. 04/23/23  Yes Antonieta Iba, MD  Ascorbic Acid (VITAMIN C) 1000 MG tablet Take 1,000 mg by mouth daily.   Yes [provider]  atorvastatin (LIPITOR) 20 MG tablet Take 20 mg by mouth daily. 04/30/23 04/29/24 Yes [provider]  b complex vitamins tablet Take 1 tablet by mouth daily.   Yes [provider]  cholecalciferol (VITAMIN D) 1000 units tablet Take 2,000 Units by mouth daily.   Yes [provider]  cyanocobalamin (VITAMIN B12) 1000 MCG tablet Take 1,000 mcg by mouth daily.   Yes [provider]  docusate sodium (COLACE) 100 MG capsule Take 100 mg by mouth 2 (two) times daily.   Yes [provider]  ezetimibe (ZETIA) 10 MG tablet Take 1 tablet (10 mg total) by mouth daily. 08/03/22  Yes Gollan, Tollie Pizza, MD  FLAXSEED, LINSEED, PO Take 2 g by mouth daily.   Yes [provider]  lisinopril (ZESTRIL) 10 MG tablet Take 10 mg by mouth daily. 08/31/22  Yes [provider]  nitroGLYCERIN (NITROSTAT) 0.4 MG SL tablet Place 1 tablet (0.4 mg total) under the tongue every 5 (five) minutes as needed for chest pain. 08/03/22  Yes Gollan, Tollie Pizza, MD  pantoprazole (PROTONIX) 40 MG tablet Take 1 tablet (40 mg total) by mouth 2 (two) times daily. Patient taking differently: Take 40 mg by mouth daily. 01/11/23 06/15/23 Yes Willeen Niece, MD  pregabalin (LYRICA) 100 MG capsule Take 100 mg by mouth in the morning. 05/23/22  Yes [provider]  pregabalin (LYRICA) 150 MG capsule Take 150 mg by mouth at bedtime. 05/23/22  Yes [provider]  Probiotic Product (UP4 PROBIOTICS PO) Take 7.5 mg by mouth.   Yes [provider]  tamsulosin (FLOMAX) 0.4 MG CAPS capsule Take 2 capsules (0.8 mg total) by mouth daily. 04/21/19  Yes Vanna Scotland, MD  zinc sulfate 220 (50 Zn) MG capsule Take 1 tablet by mouth daily. 07/19/22  Yes [provider]  aspirin 81 MG tablet Take 1 tablet (81 mg total) by mouth daily. Patient not taking: Reported on 06/15/2023 12/12/18   Antonieta Iba, MD  furosemide (LASIX) 20 MG tablet Take 1 tablet (20 mg total) by mouth every other day. Patient not taking: Reported on 06/15/2023 04/23/23 07/22/23  Antonieta Iba, MD  potassium chloride (KLOR-CON) 10 MEQ tablet Take 1 tablet (10 mEq total) by mouth every other day. Patient not taking: Reported on 06/15/2023 04/23/23 07/22/23  Antonieta Iba, MD  simvastatin (ZOCOR) 40 MG tablet Take 1 tablet (40 mg total) by mouth every evening. Patient not taking: Reported on 06/15/2023 08/03/22   Antonieta Iba, MD    Physical Exam: Vitals:   06/15/23 1030 06/15/23 1100 06/15/23 1130 06/15/23 1515  BP: (!) 77/54 (!) 98/51 (!) 104/58 117/83  Pulse: (!) 46 (!) 44 (!) 48 70  Resp: 13 13 11 19   Temp:      TempSrc:      SpO2: 96% 100% 100% (!) 74%  Weight:      Height:       General: Not in acute distress HEENT:       Eyes: PERRL, EOMI, no jaundice       ENT: No discharge from the ears and nose, no pharynx injection, no tonsillar enlargement.        Neck: No JVD, no bruit, no mass felt. Heme: No neck lymph node enlargement. Cardiac: S1/S2, RRR, No murmurs, No gallops or rubs. Respiratory: No rales, wheezing, rhonchi or rubs. GI: Soft, nondistended, nontender, no rebound pain, no organomegaly, BS present. GU: No hematuria Ext: No pitting leg edema bilaterally. 1+DP/PT pulse bilaterally. Musculoskeletal: No joint deformities, No joint redness or warmth, no limitation of ROM in spin. Skin: No rashes.  Neuro: Alert, oriented X3,  cranial nerves II-XII grossly intact, moves all extremities normally. Pt has mild aphasia from previous stroke.  Psych: Patient is not psychotic, no suicidal or hemocidal ideation.  Labs on Admission: I have personally reviewed following labs and imaging studies  CBC: Recent Labs  Lab 06/15/23 0926  WBC 7.2  HGB 13.1  HCT 37.5*  MCV 88.0  PLT 163   Basic Metabolic Panel: Recent Labs  Lab 06/15/23 0926  NA 135  K 4.5  CL 102  CO2 25  GLUCOSE 129*  BUN 21  CREATININE 1.18  CALCIUM 8.8*   GFR: Estimated Creatinine Clearance: 46.3 mL/min (by C-G formula based on SCr of 1.18 mg/dL). Liver Function Tests: No results for input(s): "AST", "ALT", "ALKPHOS", "BILITOT", "PROT", "ALBUMIN" in the last 168 hours. No results for input(s): "LIPASE", "AMYLASE" in the last 168 hours. No results for input(s): "AMMONIA" in the last 168 hours. Coagulation Profile: No results for input(s): "INR", "PROTIME" in the last 168 hours. Cardiac Enzymes: No results for input(s): "CKTOTAL", "CKMB", "CKMBINDEX", "TROPONINI" in the last 168 hours. BNP (last 3 results) No results for input(s): "PROBNP" in the last 8760 hours. HbA1C: No results for input(s): "HGBA1C" in the last 72 hours. CBG: Recent Labs  Lab 06/15/23 1516  GLUCAP 91   Lipid Profile: No results for input(s): "CHOL", "HDL", "LDLCALC", "TRIG", "CHOLHDL", "LDLDIRECT" in the last 72 hours. Thyroid Function Tests: No results for input(s): "TSH", "T4TOTAL", "FREET4", "T3FREE", "THYROIDAB" in the last 72 hours. Anemia Panel: No results for input(s): "VITAMINB12", "FOLATE", "FERRITIN", "TIBC", "IRON", "RETICCTPCT" in the last 72 hours. Urine analysis:    Component Value Date/Time   COLORURINE YELLOW (A) 01/08/2023 0027   APPEARANCEUR CLEAR (A) 01/08/2023 0027   APPEARANCEUR Clear 05/29/2018 1049   LABSPEC 1.017 01/08/2023 0027   LABSPEC 1.011 09/13/2012 1609   PHURINE 5.0 01/08/2023 0027   GLUCOSEU NEGATIVE 01/08/2023 0027    GLUCOSEU Negative 09/13/2012 1609   HGBUR NEGATIVE 01/08/2023 0027   BILIRUBINUR NEGATIVE 01/08/2023 0027   BILIRUBINUR Negative 05/29/2018 1049   BILIRUBINUR Negative 09/13/2012 1609   KETONESUR 5 (A) 01/08/2023 0027   PROTEINUR NEGATIVE 01/08/2023 0027   NITRITE NEGATIVE 01/08/2023 0027   LEUKOCYTESUR NEGATIVE 01/08/2023 0027   LEUKOCYTESUR Negative 09/13/2012 1609   Sepsis Labs: @LABRCNTIP (procalcitonin:4,lacticidven:4) )No results found for this or any previous visit (from the past 240 hour(s)).   Radiological Exams on Admission: CARDIAC CATHETERIZATION  Result Date: 06/15/2023   Suezanne Jacquet Cx to Prox Cx lesion is 100% stenosed -> the following circumflex fills berry but appears to be bridging collaterals.  Prox Cx to Mid Cx lesion is 80% stenosed.   Ost RCA to Prox RCA lesion is 95% stenosed.   Prox RCA stent has 55% in-stent restenosis   Prox RCA to Mid RCA lesion is 99% stenosed. Mid RCA lesion is 70% stenosed.   Dist RCA lesion is 99% stenosed.  TIMI I flow to the distal vessel.   1st Diag lesion is 55% stenosed.   Mid LAD lesion is 65% stenosed.   -----------------------------------   There is mild to moderate left ventricular systolic dysfunction.  The left ventricular ejection fraction is 35-45% by visual estimate. Basal to mid inferior akinesis with inferolateral hypokinesis.  LV end diastolic pressure is mildly elevated.   There is mild aortic valve stenosis.   -----------------------------------   Upon bed availability, the patient will be transferred to [facility] -> he will be placed under the Team C service   Plan will be to restart IV heparin pending decision about appropriate intervention.  Will need to determine the risks of adding antiplatelet agent to his recently started Eliquis based on his history of stroke and intracerebral hemorrhage. POST-CATH DIAGNOSES Severe multivessel CAD with unclear culprit lesion:  Patient was chest pain-free, and hemodynamically stable  upon  completion of the catheterization. Ostial-proximal LCx subtotal 99 to 100% occlusion with <TIMI I flow Mild diffuse proximal LAD disease with 70% stenosis after short mid vessel diagonal branch, the large Ramus-like first diagonal with focal 50% stenosis Heavily calcified ostial RCA (very difficult to engage) 95% stenosis followed by diffuse 40 to 60% ISR of the proximal to mid vessel stent followed by 99 % subtotal occlusion after the stent crossing major RV marginal branch. Brisk collaterals fill the PDA Moderate reduced EF with basal to mid inferior essentially akinesis and inferolateral hypokinesis. With an EDP of 17 mmHg. RECOMMENDATIONS Admit and restart IV heparin 6 hours after Mynx closure High-dose high intensity statin No beta-blocker because history of tachybradycardia With the extent of disease he has, and either the circumflex or the RCA lesion being somewhat difficult I chose to delay attempted intervention.  Would recommend Transfer to [facility] to discuss possible atherectomy PCI of the RCA plus or minus attempting PCI of the circumflex.  Will need discussion between IC cardiologist and possibly CVTS given the fact that there does appear to be LAD lesion as well. As he was chest pain-free and hemodynamically stable, I did not place balloon pump. Bryan Lemma, MD  DG Chest Port 1 View  Result Date: 06/15/2023 CLINICAL DATA:  Chest pain. EXAM: PORTABLE CHEST 1 VIEW COMPARISON:  Chest x-ray dated January 08, 2023. FINDINGS: The heart remains at the upper limits of normal in size. Loop recorder again noted. Increased interstitial opacities compared to prior, more prominent on the right. No focal consolidation, pleural effusion, or pneumothorax. No acute osseous abnormality. IMPRESSION: 1. Increased interstitial opacities compared to prior, more prominent on the right, concerning for atypical infection. Electronically Signed   By: Obie Dredge M.D.   On: 06/15/2023 10:26       Assessment/Plan Principal Problem:   NSTEMI (non-ST elevated myocardial infarction) (HCC) Active Problems:   CAD S/P percutaneous coronary angioplasty   Chronic diastolic CHF (congestive heart failure) (HCC)   Atrial fibrillation, chronic (HCC)   Hyperlipidemia   HYPERTENSION, BENIGN   History of CVA (cerebrovascular accident)   BPH (benign prostatic hyperplasia)   Gastric ulcer   Assessment and Plan:  NSTEMI (non-ST elevated myocardial infarction) (HCC) and hx of CAD with stent placement: trop  1055 --> 1324.  Dr. Herbie Baltimore of cardiology did urgent cardiac cath, which showed severe multivessel CAD, no stent placed.  Per Dr. Herbie Baltimore, patient will need to be transferred to [facility] for further treatment.  Dr. Lalla Brothers accepted patient.  Patient will be transferred to [facility] stepdown.  I already called [facility].  Currently patient is chest pain-free.  Patient had hypotension initially with blood pressure 77/54, which improved to 107/61.  Patient received 1 L LR bolus.  - admit to ICU in [facility] as inpatient --> will transfer to [facility] SDU - continue IV heparin - prn Morphine for pain - Aspirin 81 mg daily per Dr. Elissa Hefty order - increase lipitor dose to 80 mg daily - Risk factor stratification: will check FLP and A1C  - 2d echo  Chronic diastolic CHF (congestive heart failure) (HCC): 2D echo on 02/25/2020 showed EF of 55% with grade 1 diastolic dysfunction.  Patient denies shortness breath.  No leg edema.  CHF is compensated.  Patient's not taking Lasix currently. -Check BNP  Atrial fibrillation, chronic (HCC): Currently sinus rhythm, with bradycardia. -Patient is on IV heparin -Hold Eliquis  Hyperlipidemia -Lipitor  HYPERTENSION, BENIGN: Blood pressure soft -Hold lisinopril -IV  hydralazine as needed  History of CVA (cerebrovascular accident) -Lipitor -Hold Eliquis  BPH (benign prostatic hyperplasia) -Continue Flomax  Gastric ulcer -Will increase Protonix  dose to 40 mg twice daily since patient is started on aspirin  Peripheral neuropathy -Continue home Lyrica     DVT ppx: on IV Heparin  Code Status: Full code   Family Communication:  Yes, patient's wife, son and daughter at bed side.       Disposition Plan:  Anticipate discharge back to previous environment  Consults called:  Dr. Herbie Baltimore of card  Admission status and Level of care: ICU:    as inpt      Dispo: The patient is from: Home              Anticipated d/c is to: transfer to [facility]              Anticipated d/c date is: 2 days              Patient currently is not medically stable to d/c.    Severity of Illness:  The appropriate patient status for this patient is INPATIENT. Inpatient status is judged to be reasonable and necessary in order to provide the required intensity of service to ensure the patient's safety. The patient's presenting symptoms, physical exam findings, and initial radiographic and laboratory data in the context of their chronic comorbidities is felt to place them at high risk for further clinical deterioration. Furthermore, it is not anticipated that the patient will be medically stable for discharge from the hospital within 2 midnights of admission.   * I certify that at the point of admission it is my clinical judgment that the patient will require inpatient hospital care spanning beyond 2 midnights from the point of admission due to high intensity of service, high risk for further deterioration and high frequency of surveillance required.*       Date of Service 06/15/2023    Lorretta Harp [facility]   If 7PM-7AM, please contact night-coverage [email or web address] 06/15/2023, 4:36 PM

## 2023-06-15 NOTE — Consult Note (Signed)
Cardiology Consultation   Patient ID: [name] [facility]. MRN: [id number]; DOB: 1933-05-10  Admit date: 06/15/2023 Date of Consult: 06/15/2023  PCP:  [name], MD   [facility] Providers Cardiologist:  [name], MD        Patient Profile:   Per Notaro. is a 89 y.o. male with a hx of CAD (PCI RCA in ~2007), HTN, HLD, R Carotid Stenosi, Recently Diagnosed Afib (on Eliquis) who is being seen 06/15/2023 for the evaluation of NSTEMI with concerning EKG & ongoing Angina at the request of Dr. Larinda Buttery [facility] MD).  History of Present Illness:   Mr. Shores was recently diagnosed with atrial fibrillation, and most recently saw Dr. Mariah Milling on 04/23/2023.  Noted to be in A-fib with a rate of 62 bpm.  Beta-blockers have been held because of history of bradycardia.  He was started on Eliquis for new diagnosis of A-fib.  Patient denied any chest pain or pressure.  He was doing relatively well throughout the rest of the month but earlier this past week started having intermittent episodes of chest discomfort.  This morning open [place] 06/15/2023) he woke up and noted substernal chest discomfort at roughly 7:30 in the morning, he contacted his wife who then called EMS.  Upon arrival to the ER he noted that his chest pain almost resolved after sublingual nitroglycerin.  However the pain recurred later on in the morning and EKG showed more progression of ST depressions.  Troponin levels were over thousand.  Dr. Larinda Buttery was concern for recurrent chest pain and abnormal EKG.  Contacted me to evaluate.  We did confirm that the patient and family would be interested in cardiac catheterization, therefore with ongoing chest pain, positive troponins and abnormal EKG we decided to go ahead and activate for urgent non-STEMI.  He was treated with some nitroglycerin earlier and his blood pressure dropped therefore they used to morphine.  Upon my assessment he was almost pain-free  with the pain down to 1 out of 10 range.  He got a little nauseated with the initial medications but is doing fine now.  Not noticing any dyspnea.  Seems to be relatively comfortable.  He has had no symptoms to suggest any knowledge of A-fib.  No heart failure symptoms of PND, orthopnea or edema.  At baseline he has essential tremor more notable on the right than the left arm.  He has a history of stroke with aphasia therefore his wife does most of the talking.  He does not have a living will but clearly indicates that he would not want to be kept alive on machines for long time.  He does not have DNR.  Had a long discussion about concerns of potential findings on EKG and the potential that he may need to be transferred to [facility] for potential CABG assessment.  Last dose of Eliquis was last night roughly 6 PM.   Past Medical History:  Diagnosis Date   Acute embolic stroke (HCC) 02/01/2017   BPH (benign prostatic hyperplasia)    Bradycardia 11/10/2015   Coronary artery disease    Dizziness 08/21/2012   DYSPEPSIA 07/31/2010   Qualifier: Diagnosis of  By: Janee Morn RN, Morrie Sheldon     Hyperlipidemia 05/16/2010   Qualifier: Diagnosis of  By: Mariah Milling MD, [name]      Hypertension    HYPERTENSION, BENIGN 05/16/2010   Qualifier: Diagnosis of  By: Mariah Milling MD, [name]     Stenosis of right carotid  artery 02/01/2017  Proximal RCA PCI September 20,007-Taxus 3.0 x 24 mm stent. CVA 2018 with aphasia and tremor  Past Surgical History:  Procedure Laterality Date   ESOPHAGOGASTRODUODENOSCOPY (EGD) WITH PROPOFOL N/A 01/08/2023   Procedure: ESOPHAGOGASTRODUODENOSCOPY (EGD) WITH PROPOFOL;  Surgeon: Midge Minium, MD;  Location: [facility] ENDOSCOPY;  Service: Endoscopy;  Laterality: N/A;   HIP SURGERY     right hip    Intestinal Blockage     LOOP RECORDER INSERTION N/A 05/14/2017   Procedure: Loop Recorder Insertion;  Surgeon: Duke Salvia, MD;  Location: [facility] INVASIVE CV LAB;  Service: Cardiovascular;  Laterality: N/A;    SKIN CANCER EXCISION     x2   TONSILLECTOMY    Most recent Echo at [facility] (04/11/2023: Normal LV size and function.  Moderate MR.  Mild AI.  Mild to moderate AAS.  Mean gradient 24 mmHg-moderate AAS.  Home Medications:  Prior to Admission medications   Medication Sig Start Date End Date Taking? Authorizing Provider  acetaminophen (TYLENOL) 650 MG CR tablet Take 650 mg by mouth every 8 (eight) hours as needed.     [provider]  apixaban (ELIQUIS) 5 MG TABS tablet Take 1 tablet (5 mg total) by mouth 2 (two) times daily. 04/23/23   Antonieta Iba, MD  Ascorbic Acid (VITAMIN C) 1000 MG tablet Take 1,000 mg by mouth daily.    [provider]  aspirin 81 MG tablet Take 1 tablet (81 mg total) by mouth daily. 12/12/18   Antonieta Iba, MD  b complex vitamins tablet Take 1 tablet by mouth daily.    [provider]  cholecalciferol (VITAMIN D) 1000 units tablet Take 2,000 Units by mouth daily.    [provider]  cyanocobalamin (VITAMIN B12) 1000 MCG tablet Take 1,000 mcg by mouth daily.    [provider]  docusate sodium (COLACE) 100 MG capsule Take 100 mg by mouth 2 (two) times daily.    [provider]  ezetimibe (ZETIA) 10 MG tablet Take 1 tablet (10 mg total) by mouth daily. 08/03/22   Antonieta Iba, MD  FLAXSEED, LINSEED, PO Take 2 g by mouth daily.    [provider]  furosemide (LASIX) 20 MG tablet Take 1 tablet (20 mg total) by mouth every other day. 04/23/23 07/22/23  Antonieta Iba, MD  lisinopril (ZESTRIL) 10 MG tablet Take 10 mg by mouth daily. 08/31/22   [provider]  nitroGLYCERIN (NITROSTAT) 0.4 MG SL tablet Place 1 tablet (0.4 mg total) under the tongue every 5 (five) minutes as needed for chest pain. 08/03/22   Antonieta Iba, MD  pantoprazole (PROTONIX) 40 MG tablet Take 1 tablet (40 mg total) by mouth 2 (two) times daily. 01/11/23 04/23/23  Willeen Niece, MD  potassium chloride  (KLOR-CON) 10 MEQ tablet Take 1 tablet (10 mEq total) by mouth every other day. 04/23/23 07/22/23  Antonieta Iba, MD  pregabalin (LYRICA) 100 MG capsule TAKE ONE CAPSULE BY MOUTH EVERY MORNING FOR DIABETIC COMPLICATION PERIPHERAL NEUROPATHY 05/23/22   [provider]  pregabalin (LYRICA) 150 MG capsule TAKE ONE CAPSULE BY MOUTH AT BEDTIME FOR DIABETIC COMPLICATION NOTE: TAKING 100MG  DAILY AND 150MG  AT NIGHT. 05/23/22   [provider]  Probiotic Product (UP4 PROBIOTICS PO) Take 7.5 mg by mouth.    [provider]  simvastatin (ZOCOR) 40 MG tablet Take 1 tablet (40 mg total) by mouth every evening. 08/03/22   Antonieta Iba, MD  tamsulosin (FLOMAX) 0.4 MG  CAPS capsule Take 2 capsules (0.8 mg total) by mouth daily. 04/21/19   Vanna Scotland, MD  zinc sulfate 220 (50 Zn) MG capsule Take 1 tablet by mouth daily. 07/19/22   [provider]    Inpatient Medications: Scheduled Meds:  Continuous Infusions:  heparin 1,150 Units/hr (06/15/23 1245)   PRN Meds:   Allergies:    Allergies  Allergen Reactions   Penicillins Rash    Rash    Rash    Gabapentin    Penicillin G    Codeine Nausea Only and Nausea And Vomiting    Other reaction(s): Vomiting Other reaction(s): Vomiting    Social History:   Social History   Socioeconomic History   Marital status: Married    Spouse name: Not on file   Number of children: Not on file   Years of education: Not on file   Highest education level: Not on file  Occupational History   Not on file  Tobacco Use   Smoking status: Never   Smokeless tobacco: Never  Vaping Use   Vaping status: Never Used  Substance and Sexual Activity   Alcohol use: Yes    Comment: occassionally   Drug use: No   Sexual activity: Not on file  Other Topics Concern   Not on file  Social History Narrative   Not on file   Social Determinants of Health   Financial Resource Strain: Not on file  Food Insecurity: No Food Insecurity  (01/08/2023)   Hunger Vital Sign    Worried About Running Out of Food in the Last Year: Never true    Ran Out of Food in the Last Year: Never true  Transportation Needs: No Transportation Needs (01/08/2023)   PRAPARE - Administrator, Civil Service (Medical): No    Lack of Transportation (Non-Medical): No  Physical Activity: Not on file  Stress: Not on file  Social Connections: Not on file  Intimate Partner Violence: Not At Risk (01/08/2023)   Humiliation, Afraid, Rape, and Kick questionnaire    Fear of Current or Ex-Partner: No    Emotionally Abused: No    Physically Abused: No    Sexually Abused: No    Family History:   Reviewed Family History  Problem Relation Age of Onset   Hypertension Mother    Cancer Other    Hyperlipidemia Other    Hypertension Other    Other Other        CHF     ROS:  Please see the history of present illness.  Really only notable for his baseline tremor joint pains so memory loss and aphasia. All other ROS reviewed and negative.     Physical Exam/Data:   Vitals:   06/15/23 1000 06/15/23 1030 06/15/23 1100 06/15/23 1130  BP: 108/60 (!) 77/54 (!) 98/51 (!) 104/58  Pulse: (!) 41 (!) 46 (!) 44 (!) 48  Resp: 16 13 13 11   Temp:      TempSrc:      SpO2: 100% 96% 100% 100%  Weight:      Height:        Intake/Output Summary (Last 24 hours) at 06/15/2023 1312 Last data filed at 06/15/2023 1226 Gross per 24 hour  Intake 1000 ml  Output --  Net 1000 ml      06/15/2023    9:23 AM 05/11/2023   11:47 AM 04/23/2023    9:07 AM  Last 3 Weights  Weight (lbs) 182 lb 9.6 oz 182 lb 1.6 oz  182 lb 4 oz  Weight (kg) 82.827 kg 82.6 kg 82.668 kg     Body mass index is 27.76 kg/m.  General:  Well nourished, well developed, in mild distress; upon my evaluation he had received morphine and was somewhat more comfortable.  Pain was minimal HEENT: normal Neck: no JVD Vascular: Cannot exclude soft right carotid bruit.  Otherwise no left carotid bruits;  Distal pulses 2+ bilaterally Cardiac:  normal S1, S2; irregularly irregular rhythm.; 1 - 2/6 SEM at RUSB. Lungs:  clear to auscultation bilaterally, no wheezing, rhonchi or rales  Abd: soft, nontender, no hepatomegaly  Ext: no edema Musculoskeletal:  No deformities, BUE and BLE strength normal and equal Skin: warm and dry  Neuro:  CNs 2-12 intact, no focal abnormalities noted Psych:  Normal affect   EKG:  The most recent EKG at 11:35 AM 06/15/2023 was personally reviewed and demonstrates: A-fib with IVCD.  ST depressions noted in leads I, II, V2 through V6 5 with flat ST segments and V6.  Subtle ST elevations noted in aVR with Q waves.  Findings consistent with possible anterior ischemia, but does not meet STEMI criteria. Telemetry:  Telemetry was personally reviewed and demonstrates: Rate controlled A-fib  Relevant CV Studies: See above Zio monitor from April 2024: Frequent episodes of tachycardia, frequent PACs; 408 Supraventricular Tachycardia runs occurred, the run with the fastest interval lasting 5 beats with a max rate of 146 bpm, the longest lasting 25.4 secs with an avg rate of 110 bpm. ; History of bradycardia  Laboratory Data:  High Sensitivity Troponin:   Recent Labs  Lab 06/15/23 0926 06/15/23 1214  TROPONINIHS 1,055* 1,324*     Chemistry Recent Labs  Lab 06/15/23 0926  NA 135  K 4.5  CL 102  CO2 25  GLUCOSE 129*  BUN 21  CREATININE 1.18  CALCIUM 8.8*  GFRNONAA 60*  ANIONGAP 8    No results for input(s): "PROT", "ALBUMIN", "AST", "ALT", "ALKPHOS", "BILITOT" in the last 168 hours. Lipids No results for input(s): "CHOL", "TRIG", "HDL", "LABVLDL", "LDLCALC", "CHOLHDL" in the last 168 hours.  Hematology Recent Labs  Lab 06/15/23 0926  WBC 7.2  RBC 4.26  HGB 13.1  HCT 37.5*  MCV 88.0  MCH 30.8  MCHC 34.9  RDW 17.1*  PLT 163   Thyroid No results for input(s): "TSH", "FREET4" in the last 168 hours.  BNPNo results for input(s): "BNP", "PROBNP" in the last  168 hours.  DDimer No results for input(s): "DDIMER" in the last 168 hours.   Radiology/Studies:  DG Chest Port 1 View  Result Date: 06/15/2023 CLINICAL DATA:  Chest pain. EXAM: PORTABLE CHEST 1 VIEW COMPARISON:  Chest x-ray dated January 08, 2023. FINDINGS: The heart remains at the upper limits of normal in size. Loop recorder again noted. Increased interstitial opacities compared to prior, more prominent on the right. No focal consolidation, pleural effusion, or pneumothorax. No acute osseous abnormality. IMPRESSION: 1. Increased interstitial opacities compared to prior, more prominent on the right, concerning for atypical infection. Electronically Signed   By: Obie Dredge M.D.   On: 06/15/2023 10:26     Assessment and Plan:   Urgent non-STEMI/ACS -> EKG with diffuse ST depressions and elevation only in aVR somewhat concerning for left main disease. With ongoing pain, plan is to proceed with cardiac catheterization lab to ascertain coronary anatomy and potential PCI if indicated. Further plans per cath New diagnosis of A-fib, was on Eliquis up until last night.  Will hold for now.  Likely use heparin until current issues resolved.  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 Blood pressure relatively stable on modest dose lisinopril.  Continue simvastatin for now but likely convert to a more potent statin. Has mild HFpEF baseline on low-dose Lasix.  Monitor for symptoms but hold off on diuretic for now.   Risk Assessment/Risk Scores:     TIMI Risk Score for Unstable Angina or Non-ST Elevation MI:   The patient's TIMI risk score is 6, which indicates a 41% risk of all cause mortality, new or recurrent myocardial infarction or need for urgent revascularization in the next 14 days.    CHA2DS2-VASc Score = 6   This indicates a 9.7% annual risk of stroke. The patient's score is based upon: CHF History: 0 HTN History: 1 Diabetes History:  0 Stroke History: 2 Vascular Disease History: 1 Age Score: 2 Gender Score: 0      For questions or updates, please contact [facility] Please consult [email or web address] for contact info under    Signed, Bryan Lemma, MD  06/15/2023 1:12 PM

## 2023-06-16 ENCOUNTER — Other Ambulatory Visit (HOSPITAL_COMMUNITY): Payer: Medicare Other

## 2023-06-16 ENCOUNTER — Inpatient Hospital Stay (HOSPITAL_COMMUNITY): Payer: Medicare Other

## 2023-06-16 DIAGNOSIS — I214 Non-ST elevation (NSTEMI) myocardial infarction: Secondary | ICD-10-CM

## 2023-06-16 LAB — ECHOCARDIOGRAM COMPLETE
AR max vel: 0.94 cm2
AV Area VTI: 0.82 cm2
AV Area mean vel: 0.83 cm2
AV Mean grad: 16 mmHg
AV Peak grad: 25 mmHg
Ao pk vel: 2.5 m/s
Area-P 1/2: 3.37 cm2
Height: 68 in
MV M vel: 4.06 m/s
MV Peak grad: 65.9 mmHg
S' Lateral: 4.4 cm
Weight: 2921.6 oz

## 2023-06-16 LAB — CBC
HCT: 36.3 % — ABNORMAL LOW (ref 39.0–52.0)
Hemoglobin: 12.3 g/dL — ABNORMAL LOW (ref 13.0–17.0)
MCH: 30 pg (ref 26.0–34.0)
MCHC: 33.9 g/dL (ref 30.0–36.0)
MCV: 88.5 fL (ref 80.0–100.0)
Platelets: 153 10*3/uL (ref 150–400)
RBC: 4.1 MIL/uL — ABNORMAL LOW (ref 4.22–5.81)
RDW: 17.3 % — ABNORMAL HIGH (ref 11.5–15.5)
WBC: 8.8 10*3/uL (ref 4.0–10.5)
nRBC: 0 % (ref 0.0–0.2)

## 2023-06-16 LAB — APTT
aPTT: 68 seconds — ABNORMAL HIGH (ref 24–36)
aPTT: 52 seconds — ABNORMAL HIGH (ref 24–36)

## 2023-06-16 LAB — BASIC METABOLIC PANEL
Anion gap: 7 (ref 5–15)
BUN: 17 mg/dL (ref 8–23)
CO2: 24 mmol/L (ref 22–32)
Calcium: 8.4 mg/dL — ABNORMAL LOW (ref 8.9–10.3)
Chloride: 103 mmol/L (ref 98–111)
Creatinine, Ser: 1.31 mg/dL — ABNORMAL HIGH (ref 0.61–1.24)
GFR, Estimated: 53 mL/min — ABNORMAL LOW (ref >60)
Glucose, Bld: 118 mg/dL — ABNORMAL HIGH (ref 70–99)
Potassium: 4.3 mmol/L (ref 3.5–5.1)
Sodium: 134 mmol/L — ABNORMAL LOW (ref 135–145)

## 2023-06-16 LAB — LIPID PANEL
Cholesterol: 90 mg/dL (ref 0–200)
HDL: 40 mg/dL — ABNORMAL LOW (ref >40)
LDL Cholesterol: 44 mg/dL (ref 0–99)
Total CHOL/HDL Ratio: 2.3 RATIO
Triglycerides: 30 mg/dL (ref <150)
VLDL: 6 mg/dL (ref 0–40)

## 2023-06-16 LAB — HEPARIN LEVEL (UNFRACTIONATED): Heparin Unfractionated: 0.51 IU/mL (ref 0.30–0.70)

## 2023-06-16 MED ORDER — PERFLUTREN LIPID MICROSPHERE
1.0000 mL | INTRAVENOUS | Status: AC | PRN
  Administered 2023-06-16: 6 mL via INTRAVENOUS

## 2023-06-16 NOTE — Progress Notes (Signed)
   Rounding Note    Patient Name: [name] [facility]. Date of Encounter: 06/16/2023  [facility] Cardiologist: [name], MD   Subjective   NAEO. Wife at bedside.   Vital Signs    Vitals:   06/15/23 2357 06/16/23 0145 06/16/23 0327 06/16/23 0743  BP:  (!) 99/49 (!) 90/51 105/74  Pulse: 61 (!) 55 (!) 104 86  Resp:  14 14 17   Temp:   97.8 F (36.6 C) 98.9 F (37.2 C)  TempSrc:   Oral Oral  SpO2:  95% 96% 91%    Intake/Output Summary (Last 24 hours) at 06/16/2023 0946 Last data filed at 06/16/2023 0500 Gross per 24 hour  Intake 66.71 ml  Output 700 ml  Net -633.29 ml      06/15/2023    9:23 AM 05/11/2023   11:47 AM 04/23/2023    9:07 AM  Last 3 Weights  Weight (lbs) 182 lb 9.6 oz 182 lb 1.6 oz 182 lb 4 oz  Weight (kg) 82.827 kg 82.6 kg 82.668 kg      Telemetry    Personally Reviewed  ECG    Personally Reviewed  Physical Exam   GEN: No acute distress.   Cardiac: irregularly irregular, no murmurs, rubs, or gallops.  Respiratory: Clear to auscultation bilaterally. Psych: Normal affect   Assessment & Plan    #NSTEMI #CAD Transferred from [facility] for complex PCI. Heart team discussions planned for this week. Cont heparin Cont asa Cont statin Echo  pending  #AF Hold eliquis, use heparin  #HTN Cont ACE    Sheria Lang T. Lalla Brothers, MD, [facility], [facility] Cardiac Electrophysiology

## 2023-06-16 NOTE — Plan of Care (Signed)

## 2023-06-16 NOTE — Progress Notes (Signed)
  Echocardiogram 2D Echocardiogram has been performed.  [name] 06/16/2023, 2:55 PM

## 2023-06-16 NOTE — Progress Notes (Addendum)
ANTICOAGULATION CONSULT NOTE   Pharmacy Consult for heparin Indication: chest pain/ACS  Allergies  Allergen Reactions   Penicillins Rash    Rash    Rash    Gabapentin    Penicillin G    Codeine Nausea Only and Nausea And Vomiting    Other reaction(s): Vomiting Other reaction(s): Vomiting    Patient Measurements:    Vital Signs: Temp: 98.9 F (37.2 C) (07/14 0743) Temp Source: Oral (07/14 0743) BP: 105/74 (07/14 0743) Pulse Rate: 86 (07/14 0743)  Labs: Recent Labs    06/15/23 0926 06/15/23 1214 06/15/23 1215 06/16/23 0640  HGB 13.1  --   --  12.3*  HCT 37.5*  --   --  36.3*  PLT 163  --   --  153  APTT  --   --  36 68*  HEPARINUNFRC  --   --  0.47 0.51  CREATININE 1.18  --   --  1.31*  TROPONINIHS 1,055* 1,324*  --   --     Estimated Creatinine Clearance: 41.7 mL/min (A) (by C-G formula based on SCr of 1.31 mg/dL (H)).   Medical History: Past Medical History:  Diagnosis Date   Acute embolic stroke (HCC) 02/01/2017   BPH (benign prostatic hyperplasia)    Bradycardia 11/10/2015   Coronary artery disease    Dizziness 08/21/2012   DYSPEPSIA 07/31/2010   Qualifier: Diagnosis of  By: Janee Morn RN, Morrie Sheldon     Hyperlipidemia 05/16/2010   Qualifier: Diagnosis of  By: Mariah Milling MD, [name]      Hypertension    HYPERTENSION, BENIGN 05/16/2010   Qualifier: Diagnosis of  By: Mariah Milling MD, [name]     Stenosis of right carotid artery 02/01/2017    Assessment: 89 YOM transfer from [facility] s/p angiography and now to resume heparin gtt (sheath removal 7/13 @1442 ), on Eliquis PTA for afib.   Given recent Eliquis use, will monitor anticoagulation using aPTT until aPTT and heparin levels correlate.   aPTT is therapeutic at 68 on 1150 units/hr. Heparin level is still affected by recent Eliquis use. CBC wnl. No evidence of bleeding.   Goal of Therapy:  Heparin level 0.3-0.7 units/ml aPTT 66-102 seconds Monitor platelets by anticoagulation protocol: Yes   Plan:  Continue heparin gtt at  1150 units/hr F/u 6 hour aPTT F/u PCI plans  Thank you for involving pharmacy in the patient's care.   Theotis Burrow, PharmD PGY1 Acute Care Pharmacy Resident  06/16/2023 8:57 AM

## 2023-06-17 ENCOUNTER — Other Ambulatory Visit: Payer: Self-pay

## 2023-06-17 ENCOUNTER — Other Ambulatory Visit (HOSPITAL_COMMUNITY): Payer: Self-pay

## 2023-06-17 ENCOUNTER — Encounter: Payer: Self-pay | Admitting: Cardiology

## 2023-06-17 DIAGNOSIS — I214 Non-ST elevation (NSTEMI) myocardial infarction: Secondary | ICD-10-CM | POA: Diagnosis not present

## 2023-06-17 DIAGNOSIS — I495 Sick sinus syndrome: Secondary | ICD-10-CM | POA: Diagnosis not present

## 2023-06-17 DIAGNOSIS — I5021 Acute systolic (congestive) heart failure: Secondary | ICD-10-CM

## 2023-06-17 DIAGNOSIS — I480 Paroxysmal atrial fibrillation: Secondary | ICD-10-CM

## 2023-06-17 LAB — BASIC METABOLIC PANEL
Anion gap: 8 (ref 5–15)
BUN: 19 mg/dL (ref 8–23)
CO2: 23 mmol/L (ref 22–32)
Calcium: 8.5 mg/dL — ABNORMAL LOW (ref 8.9–10.3)
Chloride: 102 mmol/L (ref 98–111)
Creatinine, Ser: 1.4 mg/dL — ABNORMAL HIGH (ref 0.61–1.24)
GFR, Estimated: 49 mL/min — ABNORMAL LOW (ref >60)
Glucose, Bld: 168 mg/dL — ABNORMAL HIGH (ref 70–99)
Potassium: 3.3 mmol/L — ABNORMAL LOW (ref 3.5–5.1)
Sodium: 133 mmol/L — ABNORMAL LOW (ref 135–145)

## 2023-06-17 LAB — HEMOGLOBIN A1C
Hgb A1c MFr Bld: 6.3 % — ABNORMAL HIGH (ref 4.8–5.6)
Mean Plasma Glucose: 134 mg/dL

## 2023-06-17 LAB — LIPOPROTEIN A (LPA): Lipoprotein (a): 22.4 nmol/L (ref <75.0)

## 2023-06-17 LAB — HEPARIN LEVEL (UNFRACTIONATED): Heparin Unfractionated: 0.82 IU/mL — ABNORMAL HIGH (ref 0.30–0.70)

## 2023-06-17 LAB — APTT: aPTT: >200 seconds (ref 24–36)

## 2023-06-17 MED ORDER — RANOLAZINE ER 500 MG PO TB12
500.0000 mg | ORAL_TABLET | Freq: Two times a day (BID) | ORAL | Status: DC
  Administered 2023-06-17 – 2023-06-18 (×3): 500 mg via ORAL
  Filled 2023-06-17 (×3): qty 1

## 2023-06-17 MED ORDER — CLOPIDOGREL BISULFATE 75 MG PO TABS
75.0000 mg | ORAL_TABLET | Freq: Every day | ORAL | Status: DC
  Administered 2023-06-17 – 2023-06-18 (×2): 75 mg via ORAL
  Filled 2023-06-17 (×2): qty 1

## 2023-06-17 MED ORDER — POTASSIUM CHLORIDE CRYS ER 20 MEQ PO TBCR
40.0000 meq | EXTENDED_RELEASE_TABLET | Freq: Once | ORAL | Status: AC
  Administered 2023-06-17: 40 meq via ORAL
  Filled 2023-06-17: qty 2

## 2023-06-17 MED ORDER — SODIUM CHLORIDE 0.9 % IV SOLN: INTRAVENOUS | Status: AC

## 2023-06-17 MED ORDER — DOCUSATE SODIUM 100 MG PO CAPS
100.0000 mg | ORAL_CAPSULE | Freq: Two times a day (BID) | ORAL | Status: DC
  Administered 2023-06-17 – 2023-06-18 (×2): 100 mg via ORAL
  Filled 2023-06-17 (×2): qty 1

## 2023-06-17 MED ORDER — ISOSORBIDE MONONITRATE ER 30 MG PO TB24
15.0000 mg | ORAL_TABLET | Freq: Every day | ORAL | Status: DC
  Administered 2023-06-17 – 2023-06-18 (×2): 15 mg via ORAL
  Filled 2023-06-17 (×2): qty 1

## 2023-06-17 MED ORDER — LOSARTAN POTASSIUM 25 MG PO TABS: 25.0000 mg | ORAL_TABLET | Freq: Every day | ORAL | Status: DC

## 2023-06-17 MED ORDER — APIXABAN 2.5 MG PO TABS
2.5000 mg | ORAL_TABLET | Freq: Two times a day (BID) | ORAL | Status: DC
  Administered 2023-06-17 – 2023-06-18 (×3): 2.5 mg via ORAL
  Filled 2023-06-17 (×3): qty 1

## 2023-06-17 NOTE — TOC Benefit Eligibility Note (Signed)
Pharmacy Patient Advocate Encounter  Insurance verification completed.    The patient is insured through [facility] Medicare Part D  Ran test claim for Jardiance 10 mg and the current 30 day co-pay is $519.58 due to a $545.00 deductible.   This test claim was processed through [facility]- copay amounts may vary at other pharmacies due to pharmacy/plan contracts, or as the patient moves through the different stages of their insurance plan.    Roland Earl, CPHT Pharmacy Patient Advocate Specialist [facility] Pharmacy Patient Advocate Team Direct Number: [phone]  Fax: [phone]

## 2023-06-17 NOTE — Evaluation (Signed)
Occupational Therapy Evaluation Patient Details Name: [name]. MRN: [id number] DOB: [birth year] Today's Date: 06/17/2023   History of Present Illness 89 yo male presents to [facility] on 7/13 with chest pain due to NSTEMI. S/p cardiac cath 7/13 showing Severe multivessel CAD with unclear culprit lesion. PMH includes HTN, HLD, CAD with history of carotid stenting, afib, bradycardia, cva, tremor.   Clinical Impression   Patient admitted for the diagnosis above.  PTA he lives at home with his spouse, and he remains fairly independent with ADL and mobility both in the home and in the community.  Currently he presents with generalized weakness, poor activity tolerance, decreased safety and poor dynamic balance, all impacting his functional status.  In the acute level he is needing up to Min A for basic transfers, and Mod A for lower body ADL from a sit to stand level.  OT is indicated in the acute setting to address deficits, and no post acute OT is anticipated given family support.  Spouse is looking for someone to assist for a couple hours each week.        Recommendations for follow up therapy are one component of a multi-disciplinary discharge planning process, led by the attending physician.  Recommendations may be updated based on patient status, additional functional criteria and insurance authorization.   Assistance Recommended at Discharge Intermittent Supervision/Assistance  Patient can return home with the following Assist for transportation;Assistance with cooking/housework;A little help with bathing/dressing/bathroom;A little help with walking and/or transfers;Direct supervision/assist for medications management    Functional Status Assessment  Patient has had a recent decline in their functional status and demonstrates the ability to make significant improvements in function in a reasonable and predictable amount of time.  Equipment Recommendations  None recommended by OT     Recommendations for Other Services       Precautions / Restrictions Precautions Precautions: Fall Restrictions Weight Bearing Restrictions: No      Mobility Bed Mobility Overal bed mobility: Needs Assistance Bed Mobility: Supine to Sit, Sit to Supine     Supine to sit: Min assist Sit to supine: Min assist        Transfers Overall transfer level: Needs assistance Equipment used: Rolling walker (2 wheels) Transfers: Sit to/from Stand Sit to Stand: Min assist                  Balance Overall balance assessment: Needs assistance Sitting-balance support: Feet supported Sitting balance-Leahy Scale: Good     Standing balance support: Reliant on assistive device for balance Standing balance-Leahy Scale: Poor                             ADL either performed or assessed with clinical judgement   ADL       Grooming: Wash/dry hands;Wash/dry face;Supervision/safety;Sitting           Upper Body Dressing : Set up;Sitting   Lower Body Dressing: Minimal assistance;Moderate assistance;Sit to/from stand   Toilet Transfer: Minimal assistance;Regular Toilet;Rolling walker (2 wheels);Ambulation                   Vision Baseline Vision/History: 1 Wears glasses Patient Visual Report: No change from baseline       Perception     Praxis      Pertinent Vitals/Pain Pain Assessment Pain Assessment: Faces Faces Pain Scale: No hurt Pain Intervention(s): Monitored during session     Hand Dominance Right   Extremity/Trunk  Assessment Upper Extremity Assessment Upper Extremity Assessment: Generalized weakness   Lower Extremity Assessment Lower Extremity Assessment: Defer to PT evaluation   Cervical / Trunk Assessment Cervical / Trunk Assessment: Normal   Communication Communication Communication: Expressive difficulties   Cognition Arousal/Alertness: Awake/alert Behavior During Therapy: WFL for tasks assessed/performed Overall  Cognitive Status: History of cognitive impairments - at baseline Area of Impairment: Following commands, Safety/judgement                       Following Commands: Follows one step commands with increased time Safety/Judgement: Decreased awareness of safety           General Comments   VSS on RA    Exercises     Shoulder Instructions      Home Living Family/patient expects to be discharged to:: Private residence Living Arrangements: Spouse/significant other Available Help at Discharge: Family;Available 24 hours/day Type of Home: House Home Access: Stairs to enter [employer] of Steps: 1 Entrance Stairs-Rails: Left Home Layout: Multi-level;Able to live on main level with bedroom/bathroom Alternate Level Stairs-Number of Steps: flight of stairs- no need to go upstairs   Bathroom Shower/Tub: Producer, television/film/video: Handicapped height Bathroom Accessibility: Yes How Accessible: Accessible via walker Home Equipment: Grab bars - tub/shower;Grab bars - toilet;Cane - single point;BSC/3in1;Rolling Walker (2 wheels)          Prior Functioning/Environment Prior Level of Function : Independent/Modified Independent             Mobility Comments: amb with cane as needed, ambulates up to 2 miles/day prior to admission ADLs Comments: stopped driving, no assist with ADL        OT Problem List: Decreased strength;Decreased activity tolerance;Impaired balance (sitting and/or standing);Decreased safety awareness;Impaired sensation      OT Treatment/Interventions: Self-care/ADL training;Therapeutic exercise;Therapeutic activities;Patient/family education;Balance training;DME and/or AE instruction    OT Goals(Current goals can be found in the care plan section) Acute Rehab OT Goals Patient Stated Goal: Return home OT Goal Formulation: With patient Time For Goal Achievement: 07/01/23 Potential to Achieve Goals: Good ADL Goals Pt Will Perform  Grooming: with supervision;standing Pt Will Perform Lower Body Dressing: with supervision;sit to/from stand Pt Will Transfer to Toilet: with supervision;ambulating;regular height toilet Pt/caregiver will Perform Home Exercise Program: Increased strength;Both right and left upper extremity;With theraband;With Supervision  OT Frequency: Min 2X/week    Co-evaluation              AM-PAC OT "6 Clicks" Daily Activity     Outcome Measure Help from another person eating meals?: None Help from another person taking care of personal grooming?: A Little Help from another person toileting, which includes using toliet, bedpan, or urinal?: A Little Help from another person bathing (including washing, rinsing, drying)?: A Lot Help from another person to put on and taking off regular upper body clothing?: None Help from another person to put on and taking off regular lower body clothing?: A Lot 6 Click Score: 18   End of Session Equipment Utilized During Treatment: Rolling walker (2 wheels) Nurse Communication: Mobility status  Activity Tolerance: Patient tolerated treatment well Patient left: in bed;with call bell/phone within reach;with family/visitor present  OT Visit Diagnosis: Unsteadiness on feet (R26.81);Muscle weakness (generalized) (M62.81);History of falling (Z91.81)                Time: 1638-1700 OT Time Calculation (min): 22 min Charges:  OT General Charges $OT Visit: 1 Visit OT Evaluation $OT Eval Moderate  Complexity: 1 Mod  06/17/2023  RP, OTR/L  Acute Rehabilitation Services  Office:  [phone]   Suzanna Obey 06/17/2023, 5:31 PM

## 2023-06-17 NOTE — TOC Benefit Eligibility Note (Signed)
Pharmacy Patient Advocate Encounter  Insurance verification completed.    The patient is insured through [facility]   Ran test claim for ranolazine (Ranexa) 500 mg 12 hr tablet and the current 30 day co-pay is $16.05.   This test claim was processed through [facility]- copay amounts may vary at other pharmacies due to pharmacy/plan contracts, or as the patient moves through the different stages of their insurance plan.    Roland Earl, CPHT Pharmacy Patient Advocate Specialist [facility] Patient Advocate Team Direct Number: [phone]  Fax: [phone]

## 2023-06-17 NOTE — Progress Notes (Signed)
Patient Name: Torri Langston [facility]. Date of Encounter: 06/17/2023 [facility] Cardiologist: [name], MD   Interval Summary  .    [name] Rogan Wigley. is a 89 y.o. male with a significant past medical history of coronary artery disease status post stent to RCA (self-reported 2007), paroxysmal atrial fibrillation on oral anticoagulation, prior cerebrovascular accident now with aphasia, hypertension, documented heart failure with preserved ejection fraction, hypercholesteremia, gastric ulcer bleeding (February 2024), prostate hypertrophy. Mr. Savoca was sent to [facility] by emergency medical services on 7/13 for chest pain.  Patient's wife provided a majority of history given patient's post-cerebrovascular accident aphasia.  He woke up reporting chest pain that was at the center of his chest and non-radiating.  He was unable to describe the pain further by wife states he reported sharp chest pain.  He also reported feeling weak but no other symptoms such as shortness of breath, nausea, or palpitations.   This morning patient is feeling well and denies chest pain, shortness of breath. Expresses interest in going home sooner rather than later.   Vital Signs .    Vitals:   06/16/23 1547 06/16/23 2028 06/17/23 0517 06/17/23 0816  BP: (!) 104/53 (!) 98/48 (!) 96/54 99/60  Pulse: 78 80 70   Resp: (!) 21 17    Temp: 98.7 F (37.1 C) 98.3 F (36.8 C) 98.3 F (36.8 C)   TempSrc: Oral Oral Oral   SpO2:  93% 90%     Intake/Output Summary (Last 24 hours) at 06/17/2023 0851 Last data filed at 06/16/2023 1743 Gross per 24 hour  Intake --  Output 450 ml  Net -450 ml      06/15/2023    9:23 AM 05/11/2023   11:47 AM 04/23/2023    9:07 AM  Last 3 Weights  Weight (lbs) 182 lb 9.6 oz 182 lb 1.6 oz 182 lb 4 oz  Weight (kg) 82.827 kg 82.6 kg 82.668 kg      Telemetry/ECG    Sinus rhythm with 1st degree AVB. Frequent PVCs, PACs. - Personally  Reviewed  Physical Exam .   GEN: No acute distress.   Neck: No JVD Cardiac: Irregular rhythm with PACs. No murmurs, rubs, or gallops.  Respiratory: Clear to auscultation bilaterally. GI: Soft, nontender, non-distended  MS: No edema  Assessment & Plan .     NSTEMI  At [facility], he had an initial HS-troponin that was 1,055 ng/L and went up to 1,324 ng/L.  ECG demonstrated non-specific ST depressions and T-wave abnormality.  Cardiology was consulted and patient was taken for coronary angiography by Dr. Bryan Lemma.  Coronary angiography revealed extensive coronary artery disease.  He was transferred to [facility] for complex percutaneous intervention consideration.  After discussion with Dr. [place], it appears that medical management preferable to high risk PCI (would very likely need atherectomy) and given hx GI bleeding, DAPT would also pose significant bleeding risk. Patient without angina since transfer to [facility]  Transition back to Eliquis from heparin. Stop ASA with bleeding hx. Start Plavix. Continue Atorvastatin 40mg , Zetia 10mg  Wean nitroglycerin today, plan to add Imdur 15mg . Start Ranexa 500mg  BID Beta blocker has not been given with hx tachy-brady syndrome but given need for medical management of angina, would like to reconsider this admission.  Acute HFrEF Ischemic cardiomyopathy  TTE this admission shows LVEF 35-40% with RWMA consistent with LCX territory ischemia/infarct. BP in the upper 90s/low 100s systolic this admission making modification of GDMT  challenging.  Transition from ACEi to ARB No beta blocker with hx tachy-brady syndrome? Will explore SGLT2 costs No room for spironolactone due to low BP May be able to add GDMT as nitroglycerin weaned off  Paroxysmal atrial fibrillation Secondary hypercoagulable state  Patient maintaining NSR this admission. 1st degree AV block with frequent PACs and intermittent PVCs. Will transition back to Eliquis  today. Patient technically meets criteria for standard dose Heparin (age only criteria for reduced dose), but has been on 2.5mg  BID due to GI bleed hx. Will resume this dose today. Add Plavix as below.   Hypertension  BP low normal this admission. Given new HFrEF, will switch from Lisinopril to Losartan to facilitate possible future Entresto.  Hx CVA  Continue Atorvastatin. Given hx GI bleed, would favor Plavix over ASA. Start 75mg  daily today.   For questions or updates, please contact  [facility] Please consult [email or web address] for contact info under        Signed, Perlie Gold, PA-C

## 2023-06-17 NOTE — Evaluation (Signed)
Physical Therapy Evaluation Patient Details Name: [name]. MRN: [id number] DOB: 11/27/1933 Today's Date: 06/17/2023  History of Present Illness  89 yo male presents to [facility] on 7/13 with chest pain due to NSTEMI. S/p cardiac cath 7/13 showing Severe multivessel CAD with unclear culprit lesion. PMH includes HTN, HLD, CAD with history of carotid stenting, afib, bradycardia, cva, tremor.  Clinical Impression   Pt presents with generalized weakness, impaired balance, impaired gait, and decreased activity tolerance. Pt to benefit from acute PT to address deficits. Pt ambulated good hallway distance, requires use of RW vs baseline of no AD vs cane. Pt with no reports of chest pain during mobility, Hrmax observed during gait 111 bpm. PT to progress mobility as tolerated, and will continue to follow acutely.          Assistance Recommended at Discharge Frequent or constant Supervision/Assistance  If plan is discharge home, recommend the following:  Can travel by private vehicle  A little help with walking and/or transfers;A little help with bathing/dressing/bathroom        Equipment Recommendations None recommended by PT  Recommendations for Other Services       Functional Status Assessment Patient has had a recent decline in their functional status and demonstrates the ability to make significant improvements in function in a reasonable and predictable amount of time.     Precautions / Restrictions Precautions Precautions: Fall Restrictions Weight Bearing Restrictions: No      Mobility  Bed Mobility Overal bed mobility: Needs Assistance Bed Mobility: Supine to Sit, Sit to Supine     Supine to sit: Min assist Sit to supine: Mod assist   General bed mobility comments: assist for trunk and LE management, scooting up in bed upon PT return to supine.    Transfers Overall transfer level: Needs assistance Equipment used: Rolling walker (2 wheels) Transfers: Sit  to/from Stand Sit to Stand: Min assist           General transfer comment: assist for rise and steady    Ambulation/Gait Ambulation/Gait assistance: Min guard Gait Distance (Feet): 150 Feet Assistive device: Rolling walker (2 wheels) Gait Pattern/deviations: Step-through pattern, Decreased stride length, Trunk flexed Gait velocity: decr     General Gait Details: close guard for asfety, cues for placement in RW and upright posture. HRmax observed 111 bpm, pt denies chest pain throughout  Stairs            Wheelchair Mobility     Tilt Bed    Modified Rankin (Stroke Patients Only)       Balance Overall balance assessment: Needs assistance Sitting-balance support: No upper extremity supported, Feet supported Sitting balance-Leahy Scale: Fair     Standing balance support: Bilateral upper extremity supported, During functional activity, Reliant on assistive device for balance Standing balance-Leahy Scale: Poor                               Pertinent Vitals/Pain Pain Assessment Pain Assessment: No/denies pain    Home Living Family/patient expects to be discharged to:: Private residence Living Arrangements: Spouse/significant other Available Help at Discharge: Family;Available 24 hours/day Type of Home: House Home Access: Stairs to enter Entrance Stairs-Rails: Left Entrance Stairs-Number of Steps: 1 Alternate Level Stairs-Number of Steps: flight of stairs- no need to go upstairs Home Layout: Multi-level;Able to live on main level with bedroom/bathroom Home Equipment: Grab bars - tub/shower;Grab bars - toilet;Cane - single point;BSC/3in1;Rolling Walker (2 wheels)  Prior Function Prior Level of Function : Independent/Modified Independent             Mobility Comments: amb with cane as needed, ambulates up to 2 miles/day prior to admission ADLs Comments: stopped driving     Hand Dominance        Extremity/Trunk Assessment   Upper  Extremity Assessment Upper Extremity Assessment: Defer to OT evaluation    Lower Extremity Assessment Lower Extremity Assessment: Generalized weakness    Cervical / Trunk Assessment Cervical / Trunk Assessment: Normal  Communication   Communication: Expressive difficulties (from CVA)  Cognition Arousal/Alertness: Awake/alert Behavior During Therapy: WFL for tasks assessed/performed Overall Cognitive Status: History of cognitive impairments - at baseline                                 General Comments: history of cva with cognitive deficits; some expressive (and possibly receptive) difficulties at baseline. Requires safety cues throughout mobility        General Comments      Exercises     Assessment/Plan    PT Assessment Patient needs continued PT services  PT Problem List Decreased strength;Decreased mobility;Decreased range of motion;Decreased activity tolerance;Decreased balance;Decreased knowledge of use of DME;Pain;Decreased safety awareness;Cardiopulmonary status limiting activity       PT Treatment Interventions DME instruction;Therapeutic activities;Gait training;Therapeutic exercise;Patient/family education;Balance training;Stair training;Functional mobility training;Neuromuscular re-education    PT Goals (Current goals can be found in the Care Plan section)  Acute Rehab PT Goals PT Goal Formulation: With patient Time For Goal Achievement: 07/01/23 Potential to Achieve Goals: Good    Frequency Min 1X/week     Co-evaluation               AM-PAC PT "6 Clicks" Mobility  Outcome Measure Help needed turning from your back to your side while in a flat bed without using bedrails?: A Little Help needed moving from lying on your back to sitting on the side of a flat bed without using bedrails?: A Little Help needed moving to and from a bed to a chair (including a wheelchair)?: A Little Help needed standing up from a chair using your arms  (e.g., wheelchair or bedside chair)?: A Little Help needed to walk in hospital room?: A Little Help needed climbing 3-5 steps with a railing? : A Lot 6 Click Score: 17    End of Session   Activity Tolerance: Patient tolerated treatment well Patient left: in bed;with call bell/phone within reach;with bed alarm set;with family/visitor present Nurse Communication: Mobility status PT Visit Diagnosis: Other abnormalities of gait and mobility (R26.89);Muscle weakness (generalized) (M62.81)    Time: 0935-1000 PT Time Calculation (min) (ACUTE ONLY): 25 min   Charges:   PT Evaluation $PT Eval Low Complexity: 1 Low PT Treatments $Therapeutic Activity: 8-22 mins PT General Charges $$ ACUTE PT VISIT: 1 Visit         Marye Round, PT DPT Acute Rehabilitation Services Secure Chat Preferred  Office [phone]   Inez Rosato E Christain [place] 06/17/2023, 10:43 AM

## 2023-06-17 NOTE — Progress Notes (Signed)
ANTICOAGULATION CONSULT NOTE  Pharmacy Consult for heparin Indication: chest pain/ACS Brief A/P: Heparin level supratherapeutic Decrease Heparin rate  Allergies  Allergen Reactions   Penicillins Rash    Rash    Rash    Gabapentin    Penicillin G    Codeine Nausea Only and Nausea And Vomiting    Other reaction(s): Vomiting Other reaction(s): Vomiting    Patient Measurements:    Vital Signs: Temp: 98.3 F (36.8 C) (07/14 2028) Temp Source: Oral (07/14 2028) BP: 98/48 (07/14 2028) Pulse Rate: 80 (07/14 2028)  Labs: Recent Labs    06/15/23 0926 06/15/23 1214 06/15/23 1215 06/15/23 1215 06/16/23 0640 06/16/23 1324 06/17/23 0326  HGB 13.1  --   --   --  12.3*  --   --   HCT 37.5*  --   --   --  36.3*  --   --   PLT 163  --   --   --  153  --   --   APTT  --   --  36   < > 68* 52* >200*  HEPARINUNFRC  --   --  0.47  --  0.51  --  0.82*  CREATININE 1.18  --   --   --  1.31*  --   --   TROPONINIHS 1,055* 1,324*  --   --   --   --   --    < > = values in this interval not displayed.    Estimated Creatinine Clearance: 41.7 mL/min (A) (by C-G formula based on SCr of 1.31 mg/dL (H)).  Assessment: 89 y.o. male with h/o Afib, Eliquis on hold, for heparin  Goal of Therapy:  Heparin level 0.3-0.7 units/mL Monitor platelets by anticoagulation protocol: Yes   Plan:  Decrease Heparin 1250 units/hr Check heparin level in 8 hours.   Geannie Risen, PharmD, BCPS  06/17/2023 4:57 AM

## 2023-06-17 NOTE — Progress Notes (Signed)
ANTICOAGULATION CONSULT NOTE  Pharmacy Consult for heparin Indication: chest pain/ACS   Allergies  Allergen Reactions   Penicillins Rash    Rash    Rash    Gabapentin    Penicillin G    Codeine Nausea Only and Nausea And Vomiting    Other reaction(s): Vomiting Other reaction(s): Vomiting    Patient Measurements:   Weight 82.8kg Height 5'8" Heparin weight 83 kg   Vital Signs: Temp: 98.3 F (36.8 C) (07/15 0517) Temp Source: Oral (07/15 0517) BP: 101/50 (07/15 1033) Pulse Rate: 70 (07/15 0517)  Labs: Recent Labs    06/15/23 0926 06/15/23 1214 06/15/23 1215 06/15/23 1215 06/16/23 0640 06/16/23 1324 06/17/23 0326 06/17/23 1009  HGB 13.1  --   --   --  12.3*  --   --   --   HCT 37.5*  --   --   --  36.3*  --   --   --   PLT 163  --   --   --  153  --   --   --   APTT  --   --  36   < > 68* 52* >200*  --   HEPARINUNFRC  --   --  0.47  --  0.51  --  0.82*  --   CREATININE 1.18  --   --   --  1.31*  --   --  1.40*  TROPONINIHS 1,055* 1,324*  --   --   --   --   --   --    < > = values in this interval not displayed.    Estimated Creatinine Clearance: 39 mL/min (A) (by C-G formula based on SCr of 1.4 mg/dL (H)).  Assessment: 89 y.o. male with NSTEMI and h/o Afib on Eliquis PTA now held for procedures. Cath at [facility] with multivessel CAD and complex anatomy. Patient is a high risk PCI candidate so planning on medical management for now. Pharmacy consulted for heparin.     Heparin level 0.82 is supratherapuetic therapeutic on 1250 units/hr. Team switched patient to apixaban after level was drawn.   Goal of Therapy:  Heparin level 0.3-0.7 units/mL Monitor platelets by anticoagulation protocol: Yes   Plan:  Apixaban 2.5mg  BID (age, borderline renal fx, hx ICH on clopidogrel, ok for lower dose) Monitor for signs/symptoms of bleeding    Alphia Moh, PharmD, BCPS, BCCP Clinical Pharmacist  Please check AMION for all [facility] Pharmacy phone numbers After 10:00 PM, call  Main Pharmacy [phone]

## 2023-06-18 ENCOUNTER — Telehealth: Payer: Self-pay | Admitting: Cardiology

## 2023-06-18 ENCOUNTER — Other Ambulatory Visit (HOSPITAL_COMMUNITY): Payer: Self-pay

## 2023-06-18 DIAGNOSIS — I214 Non-ST elevation (NSTEMI) myocardial infarction: Secondary | ICD-10-CM | POA: Diagnosis not present

## 2023-06-18 LAB — BASIC METABOLIC PANEL
Anion gap: 7 (ref 5–15)
BUN: 21 mg/dL (ref 8–23)
CO2: 21 mmol/L — ABNORMAL LOW (ref 22–32)
Calcium: 8.2 mg/dL — ABNORMAL LOW (ref 8.9–10.3)
Chloride: 103 mmol/L (ref 98–111)
Creatinine, Ser: 1.3 mg/dL — ABNORMAL HIGH (ref 0.61–1.24)
GFR, Estimated: 53 mL/min — ABNORMAL LOW (ref >60)
Glucose, Bld: 122 mg/dL — ABNORMAL HIGH (ref 70–99)
Potassium: 4.1 mmol/L (ref 3.5–5.1)
Sodium: 131 mmol/L — ABNORMAL LOW (ref 135–145)

## 2023-06-18 LAB — CBC
HCT: 33.4 % — ABNORMAL LOW (ref 39.0–52.0)
Hemoglobin: 11.7 g/dL — ABNORMAL LOW (ref 13.0–17.0)
MCH: 31.3 pg (ref 26.0–34.0)
MCHC: 35 g/dL (ref 30.0–36.0)
MCV: 89.3 fL (ref 80.0–100.0)
Platelets: 165 10*3/uL (ref 150–400)
RBC: 3.74 MIL/uL — ABNORMAL LOW (ref 4.22–5.81)
RDW: 17.5 % — ABNORMAL HIGH (ref 11.5–15.5)
WBC: 9.2 10*3/uL (ref 4.0–10.5)
nRBC: 0 % (ref 0.0–0.2)

## 2023-06-18 MED ORDER — ISOSORBIDE MONONITRATE ER 30 MG PO TB24
15.0000 mg | ORAL_TABLET | Freq: Every day | ORAL | 3 refills | Status: DC
  Filled 2023-06-18: qty 30, 60d supply, fill #0

## 2023-06-18 MED ORDER — RANOLAZINE ER 500 MG PO TB12
500.0000 mg | ORAL_TABLET | Freq: Two times a day (BID) | ORAL | 3 refills | Status: DC
  Filled 2023-06-18: qty 60, 30d supply, fill #0

## 2023-06-18 MED ORDER — ATORVASTATIN CALCIUM 40 MG PO TABS
40.0000 mg | ORAL_TABLET | Freq: Every day | ORAL | 3 refills | Status: DC
  Filled 2023-06-18: qty 30, 30d supply, fill #0

## 2023-06-18 MED ORDER — ORAL CARE MOUTH RINSE: 15.0000 mL | OROMUCOSAL | Status: DC | PRN

## 2023-06-18 MED ORDER — APIXABAN 2.5 MG PO TABS: 2.5000 mg | ORAL_TABLET | Freq: Two times a day (BID) | ORAL | Status: DC

## 2023-06-18 MED ORDER — LOSARTAN POTASSIUM 25 MG PO TABS: 12.5000 mg | ORAL_TABLET | Freq: Every day | ORAL | Status: DC

## 2023-06-18 MED ORDER — NITROGLYCERIN 0.4 MG SL SUBL
0.4000 mg | SUBLINGUAL_TABLET | SUBLINGUAL | 0 refills | Status: DC | PRN
  Filled 2023-06-18: qty 25, 7d supply, fill #0

## 2023-06-18 MED ORDER — CLOPIDOGREL BISULFATE 75 MG PO TABS
75.0000 mg | ORAL_TABLET | Freq: Every day | ORAL | 3 refills | Status: DC
  Filled 2023-06-18: qty 30, 30d supply, fill #0

## 2023-06-18 NOTE — Discharge Summary (Signed)
Discharge Summary    Patient ID: [name]. MRN: [id number]; DOB: 04/13/1933  Admit date: 06/15/2023 Discharge date: 06/18/2023  PCP:  [name], MD   [facility] Providers Cardiologist:  [name], MD        Discharge Diagnoses    Principal Problem:   NSTEMI (non-ST elevated myocardial infarction) [facility])    Diagnostic Studies/Procedures    06/16/23 TTE  IMPRESSIONS     1. Findings suggestive of LCX territory ischemia/infarct. Left  ventricular ejection fraction, by estimation, is 35 to 40%. Left  ventricular ejection fraction by PLAX is 35 %. The left ventricle has  moderately decreased function. The left ventricle  demonstrates regional wall motion abnormalities (see scoring  diagram/findings for description). Left ventricular diastolic parameters  are indeterminate. There is severe akinesis of the left ventricular,  basal-mid inferior wall, inferolateral wall and  lateral wall.   2. Right ventricular systolic function is mildly reduced. The right  ventricular size is normal. There is normal pulmonary artery systolic  pressure. The estimated right ventricular systolic pressure is 33.8 mmHg.   3. Left atrial size was moderately dilated.   4. The mitral valve is abnormal. Mild mitral valve regurgitation.   5. The aortic valve is tricuspid. Aortic valve regurgitation is not  visualized. Low flow, decreased gradient moderate aortic valve stenosis.  Aortic valve area, by VTI measures 0.82 cm. Aortic valve mean gradient  measures 16.0 mmHg. Aortic valve Vmax  measures 2.50 m/s. Peak gradient 16 mmHg, DI is 0.29.   6. The inferior vena cava is dilated in size with <50% respiratory  variability, suggesting right atrial pressure of 15 mmHg.   Comparison(s): Changes from prior study are noted. 02/25/2020: LVEF 55%,  borderline mild AS -mean gradient 10 mmgHg.   FINDINGS   Left Ventricle: Findings suggestive of LCX territory  ischemia/infarct.  Left ventricular ejection fraction, by estimation, is 35 to 40%. Left  ventricular ejection fraction by PLAX is 35 %. The left ventricle has  moderately decreased function. The left  ventricle demonstrates regional wall motion abnormalities. Severe akinesis  of the left ventricular, basal-mid inferior wall, inferolateral wall and  lateral wall. Definity contrast agent was given IV to delineate the left  ventricular endocardial borders.  The left ventricular internal cavity size was normal in size. There is no  left ventricular hypertrophy. Left ventricular diastolic parameters are  indeterminate.     LV Wall Scoring:  The antero-lateral wall and posterior wall are akinetic.   Right Ventricle: The right ventricular size is normal. No increase in  right ventricular wall thickness. Right ventricular systolic function is  mildly reduced. There is normal pulmonary artery systolic pressure. The  tricuspid regurgitant velocity is 2.17  m/s, and with an assumed right atrial pressure of 15 mmHg, the estimated  right ventricular systolic pressure is 33.8 mmHg.   Left Atrium: Left atrial size was moderately dilated.   Right Atrium: Right atrial size was normal in size.   Pericardium: There is no evidence of pericardial effusion.   Mitral Valve: The mitral valve is abnormal. Mild mitral annular  calcification. Mild mitral valve regurgitation.   Tricuspid Valve: The tricuspid valve is grossly normal. Tricuspid valve  regurgitation is trivial.   Aortic Valve: The aortic valve is tricuspid. Aortic valve regurgitation is  not visualized. Moderate aortic stenosis is present. Aortic valve mean  gradient measures 16.0 mmHg. Aortic valve peak gradient measures 25.0  mmHg. Aortic valve area, by VTI  measures 0.82 cm.   Pulmonic Valve: The pulmonic valve was normal in structure. Pulmonic valve  regurgitation is not visualized.   Aorta: The aortic root and ascending aorta are  structurally normal, with  no evidence of dilitation.   Venous: The inferior vena cava is dilated in size with less than 50%  respiratory variability, suggesting right atrial pressure of 15 mmHg.   IAS/Shunts: No atrial level shunt detected by color flow Doppler.    06/15/23 LHC    Ost Cx to Prox Cx lesion is 100% stenosed -> the following circumflex fills berry but appears to be bridging collaterals.  Prox Cx to Mid Cx lesion is 80% stenosed.   Ost RCA to Prox RCA lesion is 95% stenosed.   Prox RCA stent has 55% in-stent restenosis   Prox RCA to Mid RCA lesion is 99% stenosed. Mid RCA lesion is 70% stenosed.   Dist RCA lesion is 99% stenosed.  TIMI I flow to the distal vessel.   1st Diag lesion is 55% stenosed.   Mid LAD lesion is 65% stenosed.   -----------------------------------   There is mild to moderate left ventricular systolic dysfunction.  The left ventricular ejection fraction is 35-45% by visual estimate. Basal to mid inferior akinesis with inferolateral hypokinesis.  LV end diastolic pressure is mildly elevated.   There is mild aortic valve stenosis.   -----------------------------------   Upon bed availability, the patient will be transferred to [facility] -> he will be placed under the Team C service   Plan will be to restart IV heparin pending decision about appropriate intervention.  Will need to determine the risks of adding antiplatelet agent to his recently started Eliquis based on his history of stroke and intracerebral hemorrhage.   POST-CATH DIAGNOSES Severe multivessel CAD with unclear culprit lesion:  Patient was chest pain-free, and hemodynamically stable upon completion of the catheterization.  Ostial-proximal LCx subtotal 99 to 100% occlusion with <TIMI I flow Mild diffuse proximal LAD disease with 70% stenosis after short mid vessel diagonal branch, the large Ramus-like first diagonal with focal 50% stenosis Heavily calcified ostial RCA (very  difficult to engage) 95% stenosis followed by diffuse 40 to 60% ISR of the proximal to mid vessel stent followed by 99 % subtotal occlusion after the stent crossing major RV marginal branch. Brisk collaterals fill the PDA   Moderate reduced EF with basal to mid inferior essentially akinesis and inferolateral hypokinesis. With an EDP of 17 mmHg.   RECOMMENDATIONS Admit and restart IV heparin 6 hours after Mynx closure High-dose high intensity statin No beta-blocker because history of tachybradycardia With the extent of disease he has, and either the circumflex or the RCA lesion being somewhat difficult I chose to delay attempted intervention.  Would recommend Transfer to [facility] to discuss possible atherectomy PCI of the RCA plus or minus attempting PCI of the circumflex.  Will need discussion between IC cardiologist and possibly CVTS given the fact that there does appear to be LAD lesion as well. As he was chest pain-free and hemodynamically stable, I did not place balloon pump.  Diagnostic Dominance: Right  _____________   History of Present Illness     Shayon Trompeter. is a 89 y.o. male with a significant past medical history of coronary artery disease status post stent to RCA (self-reported 2007), paroxysmal atrial fibrillation on oral anticoagulation, prior cerebrovascular accident now with aphasia, hypertension, documented heart failure with preserved ejection fraction, hypercholesteremia, gastric ulcer bleeding (February 2024),  prostate hypertrophy. Mr. Esquivel was sent to [facility] by emergency medical services on 7/13 for chest pain. He woke up reporting chest pain that was at the center of his chest and non-radiating.  He was unable to describe the pain further by wife states he reported sharp chest pain.  He also reported feeling weak but no other symptoms such as shortness of breath, nausea, or palpitations.   Hospital Course     Consultants:  n/a   NSTEMI   At [facility], he had an initial HS-troponin that was 1,055 ng/L and went up to 1,324 ng/L.  ECG demonstrated non-specific ST depressions and T-wave abnormality.  Cardiology was consulted and patient was taken for coronary angiography by Dr. Bryan Lemma.  Coronary angiography revealed extensive coronary artery disease.  He was transferred to [facility] for complex percutaneous intervention consideration.   After discussion amongst interventional providers, felt that PCI options extremely limited and high risk. RCA would be very difficult to wire with diffuse disease and it appears that even if he had atherectomy, would have poor distal outflow. Continue with medical management as below: Medical management of NSTEMI. Given hx GI bleeding and use of Eliquis, will stop ASA. Plavix 75mg  every day initiated this admission. Continue Atorvastatin 40mg , Zetia 10mg  Continue Imdur 15mg . Continue Ranexa 500mg  BID Beta blocker has not been given with hx tachy-brady syndrome but given need for medical management of angina, could reconsider in outpatient follow up.    Acute HFrEF Ischemic cardiomyopathy   TTE this admission shows LVEF 35-40% with RWMA consistent with LCX territory ischemia/infarct. BP in the upper 90s/low 100s systolic this admission making modification of GDMT challenging.   Patient was transitioned from Lisinopril to Losartan but continued to have low BP. Will need to hold at discharge and revisit at follow up.  Beta blocker has not been given with hx tachy-brady syndrome but given need for medical management of angina, could reconsider in outpatient follow up.  Given low BP, concern that patient would not tolerate diuretic effect of SGLT2. Cost also prohibitive at ~$519.58. Will defer possible initiation to outpatient follow up.  No room for spironolactone due to low BP   Paroxysmal atrial fibrillation Secondary hypercoagulable state   Patient  maintaining NSR this admission. 1st degree AV block with frequent PACs and intermittent PVCs. Patient technically meets criteria for standard dose Eliquis (age only criteria for reduced dose), but has been on 2.5mg  BID due to GI bleed hx. Will resume this dose today. Add Plavix 75mg  as above.   Hypertension   BP low normal this admission. Given new HFrEF, will switch from Lisinopril to Losartan to facilitate possible future Entresto.   Hx CVA   Continue Atorvastatin. Given hx GI bleed, favor Plavix over ASA. Continue Plavix 75mg  as initated 7/15.  Ulceration of 3rd toe of left foot  Patient's daughter today reports concern over patient's third toe of left foot which he injured in a recent fall. Warm and perfused appearing on exam but dark in color. WOC nurse consult placed and patient seen. Per their recommendations, "NS cleanse, pat dry and painting of the affected area with a povidone-iodine swabstick, allowed to air dry. When dry, the area is to be covered with dry gauze (2x2) and secured with a few turns of conform bandaging/paper tape." I have also encouraged patient/daughter to regularly inspect feet and arrange PCP follow up.       Did the patient have an acute coronary syndrome (  MI, NSTEMI, STEMI, etc) this admission?:  Yes                               AHA/ACC Clinical Performance & Quality Measures: Aspirin prescribed? - No - on Eliquis. Hx GI bleeding ADP Receptor Inhibitor (Plavix/Clopidogrel, Brilinta/Ticagrelor or Effient/Prasugrel) prescribed (includes medically managed patients)? - Yes Beta Blocker prescribed? - No - hypotension High Intensity Statin (Lipitor 40-80mg  or Crestor 20-40mg ) prescribed? - Yes EF assessed during THIS hospitalization? - Yes For EF <40%, was ACEI/ARB prescribed? - No - Reason:  hypotension For EF <40%, Aldosterone Antagonist (Spironolactone or Eplerenone) prescribed? - No - Reason:  hypotension Cardiac Rehab Phase II ordered (including medically  managed patients)? - Yes       The patient will be scheduled for a TOC follow up appointment in 14 days.  A message has been sent to the [facility] and Scheduling Pool at the office where the patient should be seen for follow up.  _____________  Discharge Vitals Blood pressure (!) 94/51, pulse 85, temperature 98.8 F (37.1 C), temperature source Oral, resp. rate 18, SpO2 97%.  There were no vitals filed for this visit.  Physical Exam Vitals reviewed.  Constitutional:      Appearance: Normal appearance.  HENT:     Head: Normocephalic.     Nose: Nose normal.  Eyes:     Pupils: Pupils are equal, round, and reactive to light.  Cardiovascular:     Rate and Rhythm: Rhythm irregular.     Pulses: Normal pulses.     Heart sounds: Normal heart sounds.  Pulmonary:     Effort: Pulmonary effort is normal.     Breath sounds: Normal breath sounds.  Abdominal:     General: Abdomen is flat. Bowel sounds are normal.     Palpations: Abdomen is soft.  Musculoskeletal:     Right lower leg: No edema.     Left lower leg: No edema.     Comments: Dark/ulceration appearance of third toe of left foot. Warm and perfused. Injured in recent fall. Eschar also noted on dorsal surface of left foot digits.  Skin:    General: Skin is warm and dry.     Capillary Refill: Capillary refill takes less than 2 seconds.  Neurological:     General: No focal deficit present.     Mental Status: He is alert. Mental status is at baseline.  Psychiatric:        Mood and Affect: Mood normal.        Behavior: Behavior normal.        Thought Content: Thought content normal.        Judgment: Judgment normal.      Labs & Radiologic Studies    CBC Recent Labs    06/16/23 0640 06/18/23 0638  WBC 8.8 9.2  HGB 12.3* 11.7*  HCT 36.3* 33.4*  MCV 88.5 89.3  PLT 153 165   Basic Metabolic Panel Recent Labs    16/10/96 1009 06/18/23 0638  NA 133* 131*  K 3.3* 4.1  CL 102 103  CO2 23 21*  GLUCOSE 168* 122*  BUN  19 21  CREATININE 1.40* 1.30*  CALCIUM 8.5* 8.2*   Liver Function Tests No results for input(s): "AST", "ALT", "ALKPHOS", "BILITOT", "PROT", "ALBUMIN" in the last 72 hours. No results for input(s): "LIPASE", "AMYLASE" in the last 72 hours. High Sensitivity Troponin:   Recent Labs  Lab 06/15/23 0926  06/15/23 1214  TROPONINIHS 1,055* 1,324*    BNP Invalid input(s): "POCBNP" D-Dimer No results for input(s): "DDIMER" in the last 72 hours. Hemoglobin A1C Recent Labs    06/15/23 2152  HGBA1C 6.3*   Fasting Lipid Panel Recent Labs    06/16/23 0154  CHOL 90  HDL 40*  LDLCALC 44  TRIG 30  CHOLHDL 2.3   Thyroid Function Tests No results for input(s): "TSH", "T4TOTAL", "T3FREE", "THYROIDAB" in the last 72 hours.  Invalid input(s): "FREET3" _____________  ECHOCARDIOGRAM COMPLETE  Result Date: 06/16/2023    ECHOCARDIOGRAM REPORT   Patient Name:   Holdan Stucke [facility] Date of Exam: 06/16/2023 Medical Rec #:  [id number]                 Height:       68.0 in Accession #:    [id number]                Weight:       182.6 lb Date of Birth:  [birth year]                BSA:          1.966 m Patient Age:    67 years                  BP:           101/60 mmHg Patient Gender: M                         HR:           78 bpm. Exam Location:  Inpatient Procedure: 2D Echo, Cardiac Doppler, Color Doppler and Intracardiac            Opacification Agent Indications:    NSTEMI I21.4  History:        Patient has prior history of Echocardiogram examinations, most                 recent 02/25/2020. NSTEMI and CAD, Carotid Disease and Stroke;                 Risk Factors:Hypertension, Dyslipidemia and Non-Smoker.  Sonographer:    Aron Baba Referring Phys: [id number] [facility]ENDERSON  Sonographer Comments: Technically difficult study due to poor echo windows. IMPRESSIONS  1. Findings suggestive of LCX territory ischemia/infarct. Left ventricular ejection fraction, by estimation, is 35 to 40%. Left ventricular  ejection fraction by PLAX is 35 %. The left ventricle has moderately decreased function. The left ventricle demonstrates regional wall motion abnormalities (see scoring diagram/findings for description). Left ventricular diastolic parameters are indeterminate. There is severe akinesis of the left ventricular, basal-mid inferior wall, inferolateral wall and lateral wall.  2. Right ventricular systolic function is mildly reduced. The right ventricular size is normal. There is normal pulmonary artery systolic pressure. The estimated right ventricular systolic pressure is 33.8 mmHg.  3. Left atrial size was moderately dilated.  4. The mitral valve is abnormal. Mild mitral valve regurgitation.  5. The aortic valve is tricuspid. Aortic valve regurgitation is not visualized. Low flow, decreased gradient moderate aortic valve stenosis. Aortic valve area, by VTI measures 0.82 cm. Aortic valve mean gradient measures 16.0 mmHg. Aortic valve Vmax measures 2.50 m/s. Peak gradient 16 mmHg, DI is 0.29.  6. The inferior vena cava is dilated in size with <50% respiratory variability, suggesting right atrial pressure of 15 mmHg. Comparison(s): Changes from prior study are noted. 02/25/2020: LVEF  55%, borderline mild AS -mean gradient 10 mmgHg. FINDINGS  Left Ventricle: Findings suggestive of LCX territory ischemia/infarct. Left ventricular ejection fraction, by estimation, is 35 to 40%. Left ventricular ejection fraction by PLAX is 35 %. The left ventricle has moderately decreased function. The left ventricle demonstrates regional wall motion abnormalities. Severe akinesis of the left ventricular, basal-mid inferior wall, inferolateral wall and lateral wall. Definity contrast agent was given IV to delineate the left ventricular endocardial borders. The left ventricular internal cavity size was normal in size. There is no left ventricular hypertrophy. Left ventricular diastolic parameters are indeterminate.  LV Wall Scoring: The  antero-lateral wall and posterior wall are akinetic. Right Ventricle: The right ventricular size is normal. No increase in right ventricular wall thickness. Right ventricular systolic function is mildly reduced. There is normal pulmonary artery systolic pressure. The tricuspid regurgitant velocity is 2.17 m/s, and with an assumed right atrial pressure of 15 mmHg, the estimated right ventricular systolic pressure is 33.8 mmHg. Left Atrium: Left atrial size was moderately dilated. Right Atrium: Right atrial size was normal in size. Pericardium: There is no evidence of pericardial effusion. Mitral Valve: The mitral valve is abnormal. Mild mitral annular calcification. Mild mitral valve regurgitation. Tricuspid Valve: The tricuspid valve is grossly normal. Tricuspid valve regurgitation is trivial. Aortic Valve: The aortic valve is tricuspid. Aortic valve regurgitation is not visualized. Moderate aortic stenosis is present. Aortic valve mean gradient measures 16.0 mmHg. Aortic valve peak gradient measures 25.0 mmHg. Aortic valve area, by VTI measures 0.82 cm. Pulmonic Valve: The pulmonic valve was normal in structure. Pulmonic valve regurgitation is not visualized. Aorta: The aortic root and ascending aorta are structurally normal, with no evidence of dilitation. Venous: The inferior vena cava is dilated in size with less than 50% respiratory variability, suggesting right atrial pressure of 15 mmHg. IAS/Shunts: No atrial level shunt detected by color flow Doppler.  LEFT VENTRICLE PLAX 2D LV EF:         Left            Diastology                ventricular     LV e' medial:    6.42 cm/s                ejection        LV E/e' medial:  17.8                fraction by     LV e' lateral:   11.40 cm/s                PLAX is 35      LV E/e' lateral: 10.0                %. LVIDd:         5.30 cm LVIDs:         4.40 cm LV PW:         1.10 cm LV IVS:        1.00 cm LVOT diam:     1.90 cm LV SV:         43 LV SV Index:   22 LVOT  Area:     2.84 cm  RIGHT VENTRICLE RV S prime:     16.50 cm/s TAPSE (M-mode): 1.5 cm LEFT ATRIUM             Index        RIGHT ATRIUM  Index LA diam:        4.20 cm 2.14 cm/m   RA Area:     18.90 cm LA Vol (A2C):   83.2 ml 42.32 ml/m  RA Volume:   50.00 ml  25.43 ml/m LA Vol (A4C):   62.2 ml 31.63 ml/m LA Biplane Vol: 74.6 ml 37.94 ml/m  AORTIC VALVE AV Area (Vmax):    0.94 cm AV Area (Vmean):   0.83 cm AV Area (VTI):     0.82 cm AV Vmax:           250.00 cm/s AV Vmean:          184.800 cm/s AV VTI:            0.526 m AV Peak Grad:      25.0 mmHg AV Mean Grad:      16.0 mmHg LVOT Vmax:         82.50 cm/s LVOT Vmean:        53.800 cm/s LVOT VTI:          0.152 m LVOT/AV VTI ratio: 0.29  AORTA Ao Root diam: 3.50 cm MITRAL VALVE                TRICUSPID VALVE MV Area (PHT): 3.37 cm     TR Peak grad:   18.8 mmHg MV Decel Time: 225 msec     TR Vmax:        217.00 cm/s MR Peak grad: 65.9 mmHg MR Vmax:      406.00 cm/s   SHUNTS MV E velocity: 114.00 cm/s  Systemic VTI:  0.15 m MV A velocity: 89.10 cm/s   Systemic Diam: 1.90 cm MV E/A ratio:  1.28 Zoila Shutter MD Electronically signed by Zoila Shutter MD Signature Date/Time: 06/16/2023/3:22:28 PM    Final    CARDIAC CATHETERIZATION  Result Date: 06/15/2023   Suezanne Jacquet Cx to Prox Cx lesion is 100% stenosed -> the following circumflex fills berry but appears to be bridging collaterals.  Prox Cx to Mid Cx lesion is 80% stenosed.   Ost RCA to Prox RCA lesion is 95% stenosed.   Prox RCA stent has 55% in-stent restenosis   Prox RCA to Mid RCA lesion is 99% stenosed. Mid RCA lesion is 70% stenosed.   Dist RCA lesion is 99% stenosed.  TIMI I flow to the distal vessel.   1st Diag lesion is 55% stenosed.   Mid LAD lesion is 65% stenosed.   -----------------------------------   There is mild to moderate left ventricular systolic dysfunction.  The left ventricular ejection fraction is 35-45% by visual estimate. Basal to mid inferior akinesis with inferolateral  hypokinesis.  LV end diastolic pressure is mildly elevated.   There is mild aortic valve stenosis.   -----------------------------------   Upon bed availability, the patient will be transferred to [facility] -> he will be placed under the Team C service   Plan will be to restart IV heparin pending decision about appropriate intervention.  Will need to determine the risks of adding antiplatelet agent to his recently started Eliquis based on his history of stroke and intracerebral hemorrhage. POST-CATH DIAGNOSES Severe multivessel CAD with unclear culprit lesion:  Patient was chest pain-free, and hemodynamically stable upon completion of the catheterization. Ostial-proximal LCx subtotal 99 to 100% occlusion with <TIMI I flow Mild diffuse proximal LAD disease with 70% stenosis after short mid vessel diagonal branch, the large Ramus-like first diagonal with focal 50% stenosis Heavily calcified ostial RCA (very difficult  to engage) 95% stenosis followed by diffuse 40 to 60% ISR of the proximal to mid vessel stent followed by 99 % subtotal occlusion after the stent crossing major RV marginal branch. Brisk collaterals fill the PDA Moderate reduced EF with basal to mid inferior essentially akinesis and inferolateral hypokinesis. With an EDP of 17 mmHg. RECOMMENDATIONS Admit and restart IV heparin 6 hours after Mynx closure High-dose high intensity statin No beta-blocker because history of tachybradycardia With the extent of disease he has, and either the circumflex or the RCA lesion being somewhat difficult I chose to delay attempted intervention.  Would recommend Transfer to [facility] to discuss possible atherectomy PCI of the RCA plus or minus attempting PCI of the circumflex.  Will need discussion between IC cardiologist and possibly CVTS given the fact that there does appear to be LAD lesion as well. As he was chest pain-free and hemodynamically stable, I did not place balloon pump. Bryan Lemma,  MD  DG Chest Port 1 View  Result Date: 06/15/2023 CLINICAL DATA:  Chest pain. EXAM: PORTABLE CHEST 1 VIEW COMPARISON:  Chest x-ray dated January 08, 2023. FINDINGS: The heart remains at the upper limits of normal in size. Loop recorder again noted. Increased interstitial opacities compared to prior, more prominent on the right. No focal consolidation, pleural effusion, or pneumothorax. No acute osseous abnormality. IMPRESSION: 1. Increased interstitial opacities compared to prior, more prominent on the right, concerning for atypical infection. Electronically Signed   By: Obie Dredge M.D.   On: 06/15/2023 10:26   Disposition   Pt is being discharged home today in good condition.  Follow-up Plans & Appointments        Discharge Medications   Allergies as of 06/18/2023       Reactions   Penicillins Rash   Rash Rash   Gabapentin    Penicillin G    Codeine Nausea Only, Nausea And Vomiting   Other reaction(s): Vomiting Other reaction(s): Vomiting        Medication List     STOP taking these medications    aspirin 81 MG tablet   lisinopril 10 MG tablet Commonly known as: ZESTRIL       TAKE these medications    acetaminophen 650 MG CR tablet Commonly known as: TYLENOL Take 650 mg by mouth every 8 (eight) hours as needed.   apixaban 2.5 MG Tabs tablet Commonly known as: ELIQUIS Take 1 tablet (2.5 mg total) by mouth 2 (two) times daily. What changed:  medication strength how much to take   atorvastatin 40 MG tablet Commonly known as: LIPITOR Take 1 tablet (40 mg total) by mouth daily. Start taking on: June 19, 2023 What changed:  medication strength how much to take   b complex vitamins tablet Take 1 tablet by mouth daily.   cholecalciferol 1000 units tablet Commonly known as: VITAMIN D Take 2,000 Units by mouth daily.   clopidogrel 75 MG tablet Commonly known as: PLAVIX Take 1 tablet (75 mg total) by mouth daily. Start taking on: June 19, 2023    cyanocobalamin 1000 MCG tablet Commonly known as: VITAMIN B12 Take 1,000 mcg by mouth daily.   docusate sodium 100 MG capsule Commonly known as: COLACE Take 100 mg by mouth 2 (two) times daily.   ezetimibe 10 MG tablet Commonly known as: ZETIA Take 1 tablet (10 mg total) by mouth daily.   FLAXSEED (LINSEED) PO Take 2 g by mouth daily.   isosorbide mononitrate 30 MG 24 hr tablet  Commonly known as: IMDUR Take 0.5 tablets (15 mg total) by mouth daily. Start taking on: June 19, 2023   nitroGLYCERIN 0.4 MG SL tablet Commonly known as: NITROSTAT Place 1 tablet (0.4 mg total) under the tongue every 5 (five) minutes x 3 doses as needed for chest pain. What changed: when to take this   pantoprazole 40 MG tablet Commonly known as: PROTONIX Take 1 tablet (40 mg total) by mouth 2 (two) times daily. What changed: when to take this   pregabalin 100 MG capsule Commonly known as: LYRICA Take 100 mg by mouth in the morning.   pregabalin 150 MG capsule Commonly known as: LYRICA Take 150 mg by mouth at bedtime.   ranolazine 500 MG 12 hr tablet Commonly known as: RANEXA Take 1 tablet (500 mg total) by mouth 2 (two) times daily.   tamsulosin 0.4 MG Caps capsule Commonly known as: FLOMAX Take 2 capsules (0.8 mg total) by mouth daily.   UP4 PROBIOTICS PO Take 7.5 mg by mouth.   vitamin C 1000 MG tablet Take 1,000 mg by mouth daily.   zinc sulfate 220 (50 Zn) MG capsule Take 1 tablet by mouth daily.           Outstanding Labs/Studies     Duration of Discharge Encounter   Greater than 30 minutes including physician time.  Con Memos, PA-C 06/18/2023, 10:51 AM

## 2023-06-18 NOTE — TOC Initial Note (Signed)
Transition of Care (TOC) - Initial/Assessment Note    Patient Details  Name: [name]. MRN: [id number] Date of Birth: [birth year]  Transition of Care [facility]) CM/SW Contact:    Leone Haven, RN Phone Number: 06/18/2023, 12:51 PM  Clinical Narrative:                 NCM spoke with patient wife who was at the bedside on the phone, she states his PCP is Dr. .Aram Beecham, he has insurance on file, he has had HH with [facility] in the past, NCM went on agency list with wife, she states since their qulity rating is 5 stars she would like to try them again and with a different therapist,  this NCM informed Elnita Maxwell this information, Camila Li states if she has any questions to have her to call her. This information was put on the AVS.  Elnita Maxwell is able to take referral soc will begin 24 to 48 hrs post dc.  Patient has a walker, bsc, cane, shower chair and handicapped bars in shower,  wife is support system, she will transport him home today, pta he ws indep, but now needs to use the walker.    Expected Discharge Plan: Home w Home Health Services Barriers to Discharge: No Barriers Identified   Patient Goals and CMS Choice Patient states their goals for this hospitalization and ongoing recovery are:: return home with wife CMS Medicare.gov Compare Post Acute Care list provided to:: Patient Represenative (must comment) Choice offered to / list presented to : Spouse      Expected Discharge Plan and Services In-house Referral: NA Discharge Planning Services: CM Consult Post Acute Care Choice: Home Health Living arrangements for the past 2 months: Single Family Home Expected Discharge Date: 06/18/23               DME Arranged: N/A DME Agency: NA       HH Arranged: PT HH Agency: [facility] Date HH Agency Contacted: 06/18/23 Time HH Agency Contacted: [id number] Representative spoke with at [facility] Agency: Elnita Maxwell  Prior Living Arrangements/Services Living  arrangements for the past 2 months: Single Family Home Lives with:: Spouse Patient language and need for interpreter reviewed:: Yes Do you feel safe going back to the place where you live?: Yes      Need for Family Participation in Patient Care: Yes (Comment) Care giver support system in place?: Yes (comment) Current home services: DME (walker, bsc, cane, shower chair, handicapped bars in shower,) Criminal Activity/Legal Involvement Pertinent to Current Situation/Hospitalization: No - Comment as needed  Activities of Daily Living Home Assistive Devices/Equipment: Bedside commode/3-in-1, Blood pressure cuff, Cane (specify quad or straight), Shower chair without back ADL Screening (condition at time of admission) Is the patient deaf or have difficulty hearing?: Yes (difficulty hearing) Does the patient have difficulty seeing, even when wearing glasses/contacts?: No Does the patient have difficulty concentrating, remembering, or making decisions?: Yes Does the patient have difficulty dressing or bathing?: Yes Independently performs ADLs?: Yes (appropriate for developmental age) Does the patient have difficulty walking or climbing stairs?: No Weakness of Legs: Both Weakness of Arms/Hands: Both  Permission Sought/Granted Permission sought to share information with : Case Manager Permission granted to share information with : Yes, Verbal Permission Granted     Permission granted to share info w AGENCY: [facility]        Emotional Assessment   Attitude/Demeanor/Rapport: Engaged Affect (typically observed): Appropriate Orientation: : Oriented to Self, Oriented to Place, Oriented to  Time, Oriented to Situation Alcohol / Substance Use: Not Applicable Psych Involvement: No (comment)  Admission diagnosis:  NSTEMI (non-ST elevated myocardial infarction) [facility]) [I21.4] Patient Active Problem List   Diagnosis Date Noted   Non-STEMI (non-ST elevated myocardial infarction) (HCC) 06/15/2023    Persistent atrial fibrillation (HCC) 06/15/2023   NSTEMI (non-ST elevated myocardial infarction) (HCC) 06/15/2023   Chronic diastolic CHF (congestive heart failure) (HCC) 06/15/2023   BPH (benign prostatic hyperplasia) 06/15/2023   Gastric ulcer 06/15/2023   Atrial fibrillation, chronic (HCC) 06/15/2023   ABLA (acute blood loss anemia) 01/08/2023   Coffee ground emesis 01/08/2023   Acute metabolic encephalopathy 01/08/2023   Atrial fibrillation, new onset (HCC) 01/08/2023   Acute confusion 01/08/2023   Abnormal CXR (chest x-ray) 09/12/2022   CAD S/P percutaneous coronary angioplasty 09/12/2022   Benign prostatic hyperplasia 09/12/2022   DDD (degenerative disc disease), cervical 09/12/2022   ED (erectile dysfunction) 09/12/2022   Encounter for fitting and adjustment of hearing aid 09/12/2022   History of [facility] spotted fever 09/12/2022   Intention tremor 09/12/2022   Lower GI bleed 09/12/2022   Other ill-defined and unknown causes of morbidity and mortality 09/12/2022   Counseling, unspecified 09/12/2022   Sensorineural hearing loss, bilateral 09/12/2022   Small bowel obstruction (HCC) 09/12/2022   Low testosterone 07/06/2020   History of CVA (cerebrovascular accident) 02/28/2020   Cerebral infarction (HCC) 05/10/2019   Exposure to Agent [facility] 05/10/2019   Mononeuritis 05/10/2019   Closed fracture of right hip with routine healing 02/06/2019   Dementia without behavioral disturbance (HCC) 01/06/2019   Femoral neck fracture (HCC) 10/25/2018   Stenosis of right carotid artery 02/01/2017   Acute embolic stroke (HCC) 02/01/2017   Fever 01/07/2017   Pneumonia of right middle lobe due to infectious organism 01/07/2017   Nontraumatic subcortical hemorrhage of left cerebral hemisphere (HCC) 01/06/2017   Parkinsonism 12/27/2016   Unstable balance 12/20/2015   Sinus bradycardia 11/10/2015   Lumbar radiculitis 11/01/2015   Lumbar stenosis with neurogenic claudication 11/01/2015    Osteoarthritis of spine with radiculopathy, lumbar region 11/01/2015   Essential tremor 06/03/2015   Pseudophakia, left eye 01/12/2015   Dizziness 08/21/2012   AK (actinic keratosis) 03/18/2012   History of nonmelanoma skin cancer 03/18/2012   Syncope 04/10/2011   CALF PAIN, LEFT 10/11/2010   DYSPEPSIA 07/31/2010   CHEST DISCOMFORT 07/31/2010   Hyperlipidemia 05/16/2010   HYPERTENSION, BENIGN 05/16/2010   Coronary artery disease involving native coronary artery of native heart with unstable angina pectoris (HCC) 05/16/2010   PCP:  [name], MD Pharmacy:   [facility] - [employer], [place] - [address] [place] [address] Phone: [phone] Fax: [phone]  [facility] [address] [place] [address] Phone: [phone] Fax: [phone]     Social Determinants of Health (SDOH) Social History: SDOH Screenings   Food Insecurity: No Food Insecurity (06/17/2023)  Housing: Low Risk  (06/17/2023)  Transportation Needs: No Transportation Needs (06/17/2023)  Utilities: Not At Risk (06/17/2023)  Tobacco Use: Low Risk  (06/13/2023)   Received from [facility]   SDOH Interventions:     Readmission Risk Interventions     No data to display

## 2023-06-18 NOTE — Care Management Important Message (Signed)
Important Message  Patient Details  Name: [name]. MRN: [id number] Date of Birth: [birth year]   Medicare Important Message Given:  Yes     Renie Ora 06/18/2023, 8:48 AM

## 2023-06-18 NOTE — TOC Transition Note (Signed)
Transition of Care [facility]) - CM/SW Discharge Note   Patient Details  Name: [name]. MRN: [id number] Date of Birth: Apr 15, 1933  Transition [facility]) CM/SW Contact:  Leone Haven, RN Phone Number: 06/18/2023, 12:56 PM   Clinical Narrative:    Patient is for dc home, wife to transport him home, he is set up with [facility].    Final next level of care: Home w Home Health Services Barriers to Discharge: No Barriers Identified   Patient Goals and CMS Choice CMS Medicare.gov Compare Post Acute Care list provided to:: Patient Represenative (must comment) Choice offered to / list presented to : Spouse  Discharge Placement                         Discharge Plan and Services Additional resources added to the After Visit Summary for   In-house Referral: NA Discharge Planning Services: CM Consult Post Acute Care Choice: Home Health          DME Arranged: N/A DME Agency: NA       HH Arranged: PT HH Agency: [facility] Date [facility] Agency Contacted: 06/18/23 Time HH Agency Contacted: 1248 Representative spoke with at [facility] Agency: Elnita Maxwell  Social Determinants of Health (SDOH) Interventions SDOH Screenings   Food Insecurity: No Food Insecurity (06/17/2023)  Housing: Low Risk  (06/17/2023)  Transportation Needs: No Transportation Needs (06/17/2023)  Utilities: Not At Risk (06/17/2023)  Tobacco Use: Low Risk  (06/13/2023)   Received from [facility]     Readmission Risk Interventions     No data to display

## 2023-06-18 NOTE — Consult Note (Signed)
WOC Nurse Consult Note: Reason for Consult:left foot, 3rd digit partial thickness skin injury Wound type:moisture, perfusion Pressure Injury POA: N/A Wound bed:dry, black Drainage (amount, consistency, odor) scant serosanguinous Periwound: dry Dressing procedure/placement/frequency:I have provided Nursing with conservative care orders for the care of the left foot, 3rd digit using a NS cleanse, pat dry and painting of the affected area with a povidone-iodine swabstick, allowed to air dry. When dry, the area is to be covered with dry gauze (2x2) and secured with a few turns of conform bandaging/paper tape.  WOC nursing team will not follow, but will remain available to this patient, the nursing and medical teams.  Please re-consult if needed.  Thank you for inviting [place] to participate in this patient's Plan of Care.  Ladona Mow, MSN, RN, CNS, GNP, Leda Min, [employer], [employer] phone:  [phone]

## 2023-06-18 NOTE — Progress Notes (Signed)
Heart Failure Navigator Progress Note  Assessed for Heart & Vascular TOC clinic readiness.  Patient has a scheduled [facility] appointment on 07/02/2023. .   Navigator available for reassessment of patient.   Rhae Hammock, BSN, Scientist, clinical (histocompatibility and immunogenetics) Only

## 2023-06-18 NOTE — Plan of Care (Signed)
  Problem: Activity: Goal: Risk for activity intolerance will decrease Outcome: Completed/Met   Problem: Nutrition: Goal: Adequate nutrition will be maintained Outcome: Completed/Met   Problem: Coping: Goal: Level of anxiety will decrease Outcome: Completed/Met   Problem: Pain Managment: Goal: General experience of comfort will improve Outcome: Adequate for Discharge   Problem: Safety: Goal: Ability to remain free from injury will improve Outcome: Adequate for Discharge

## 2023-06-19 ENCOUNTER — Other Ambulatory Visit
Admission: RE | Admit: 2023-06-19 | Discharge: 2023-06-19 | Disposition: A | Discharge status: Home / Self Care | Payer: Medicare Other | Source: Ambulatory Visit | Attending: Physician Assistant | Admitting: Physician Assistant

## 2023-06-19 ENCOUNTER — Telehealth: Payer: Self-pay | Admitting: Cardiovascular Disease

## 2023-06-19 DIAGNOSIS — I25118 Atherosclerotic heart disease of native coronary artery with other forms of angina pectoris: Secondary | ICD-10-CM | POA: Insufficient documentation

## 2023-06-19 DIAGNOSIS — I5021 Acute systolic (congestive) heart failure: Secondary | ICD-10-CM | POA: Insufficient documentation

## 2023-06-19 DIAGNOSIS — I10 Essential (primary) hypertension: Secondary | ICD-10-CM | POA: Insufficient documentation

## 2023-06-19 LAB — BRAIN NATRIURETIC PEPTIDE: B Natriuretic Peptide: 590.6 pg/mL — ABNORMAL HIGH (ref 0.0–100.0)

## 2023-06-19 NOTE — Telephone Encounter (Signed)
Accidental encounter. 

## 2023-06-19 NOTE — Telephone Encounter (Signed)
Called patient wife to discuss difficulty breathing. Unable to reach anyone, call disconnected. Will try again.

## 2023-06-19 NOTE — Telephone Encounter (Signed)
Wife calling in because patient has recently been in hospital from heart attack. Since being discharge from the hospital he has trouble breathing. Wife would like to speak to the dr to know what to do next. Please advise

## 2023-06-19 NOTE — Telephone Encounter (Signed)
Patient wife called back, stating that her husband recently had a heart cath and he is now home. However, she states that he is having some labored breathing, and she can hear some wheezing at times. He does not have weight gain or swelling noted, however she states she just wanted to make sure how to proceed. Chest xray at hospital during visit did show possible acute infection- I did advise patient with symptoms I would recommended to contact PCP and be seen and evaluated today if not seen by them today I would recommended urgent care visit. Another chest xray may be needed to evaluate.  Patient currently does not have a fever, but they have not been checking it.

## 2023-06-20 NOTE — Telephone Encounter (Signed)
Pt's wife called stating pt has been experiencing SOB, wheezing, and a dry cough since discharged from the hospital. Wife called yesterday and was instructed to schedule an appointment with pcp as recent chest xray on 06/15/23 was concerning for atypical infection.   Pt was evaluated by pcp. PCP order repeat chest xray (results not made available in care everywhere),  BMP, CBC, and BNP.   Pt's wife stated she is very concerned as BNP was elevated at 590.6 (previous BNP 391.9 5 days ago) and requesting to move up appointment scheduled for 7/30.   Per chart review, no sooner appointment available.  Will forward to DOD for recommendations as Dr. Mariah Milling is out of the office.

## 2023-06-20 NOTE — Telephone Encounter (Signed)
Wife is calling back to get Dr. Mariah Milling to look at pateint's blood test and to see if patient can be seen sooner. Please advise

## 2023-06-25 NOTE — Telephone Encounter (Signed)
Called and spoke with wife [name]. Informed her of the following recommendation from Dr. Mariah Milling.  Appears his lasix was held, Would recommend we restart lasix 20 mg daily with potassium 20 daily  until seen by myself in clinic Would place on cancellation list for next week Thx TG   Wife verbalized understanding. Wife states that the patient has Lasix and Potassium and does not need a prescription sent in.

## 2023-06-26 ENCOUNTER — Other Ambulatory Visit
Admission: RE | Admit: 2023-06-26 | Discharge: 2023-06-26 | Disposition: A | Discharge status: Home / Self Care | Payer: Medicare Other | Source: Ambulatory Visit | Attending: Internal Medicine | Admitting: Internal Medicine

## 2023-06-26 DIAGNOSIS — I2510 Atherosclerotic heart disease of native coronary artery without angina pectoris: Secondary | ICD-10-CM | POA: Diagnosis present

## 2023-06-26 DIAGNOSIS — I5021 Acute systolic (congestive) heart failure: Secondary | ICD-10-CM | POA: Insufficient documentation

## 2023-06-26 LAB — BRAIN NATRIURETIC PEPTIDE: B Natriuretic Peptide: 460.1 pg/mL — ABNORMAL HIGH (ref 0.0–100.0)

## 2023-07-01 NOTE — Progress Notes (Unsigned)
Cardiology Office Note  Date:  07/02/2023   ID:  [name]., DOB 03/04/1933, MRN [id number]  PCP:  Calvin Arbour, MD   Chief Complaint  Patient presents with   [facility] follow up; cardiac cath      Patient c/o dry cough and no energy. Medications reviewed by the patient verbally.     HPI:  [name] is a 89 year-old gentleman with a history of  coronary artery disease,   stent placed to his proximal RCA in September 2007, taxis stent 3.0 x 24 mm, previous syncopal episode (Etiology of his spell is uncertain),  50% carotid disease on the right CVA 2018, History of peripheral vision issues  EF 35 to 40% Moderate aortic valve stenosis who presents for routine followup of his coronary artery disease,  Tremor, and stroke New atrial fibrillation seen on today's visit  Last seen in clinic May 2024 Has seen cardiology at [facility] May 14, 2023 for discussion concerning Watchman device Taking Eliquis 2.5 twice daily, concerned about 5 twice daily and higher bleeding risk  Admitted to the hospital with chest pain May 11, 2023 Troponin 1324 Cardiac catheterization performed June 15, 2023 Severe multivessel coronary disease with unclear culprit Ostial-proximal LCx subtotal 99 to 100% occlusion with <TIMI I flow Mild diffuse proximal LAD disease with 70% stenosis after short mid vessel diagonal branch, the large Ramus-like first diagonal with focal 50% stenosis Heavily calcified ostial RCA (very difficult to engage) 95% stenosis followed by diffuse 40 to 60% ISR of the proximal to mid vessel stent followed by 99 % subtotal occlusion after the stent crossing major RV marginal branch. Brisk collaterals fill the PDA  Moderate reduced EF with basal to mid inferior essentially akinesis and inferolateral hypokinesis.  Transfer to [facility] Chest pain-free at [facility] Left circumflex felt to be subtotal, very small in caliber, diffuse moderate disease of mid LAD and large diagonal with heavy  calcification RCA with critical ostial and mid vessel disease with stents between with moderate in-stent restenosis, entire RCA severely calcified CABG not felt to be a good candidate Eliquis, Plavix, isosorbide, Ranexa  Seen by urology for difficulty with urination It was recommended he take Flomax twice daily Recent lab work reviewed CR 1.4 Now taking Lasix 20 daily for wheezing and shortness of breath since hospital discharge, symptoms have improved  Echo June 16, 2023 1. Findings suggestive of LCX territory ischemia/infarct. Left  ventricular ejection fraction, by estimation, is 35 to 40%. Left  ventricular ejection fraction by PLAX is 35 %. The left ventricle has  moderately decreased function. The left ventricle  demonstrates regional wall motion abnormalities (see scoring  diagram/findings for description). Left ventricular diastolic parameters  are indeterminate. There is severe akinesis of the left ventricular,  basal-mid inferior wall, inferolateral wall and  lateral wall.   2. Right ventricular systolic function is mildly reduced. The right  ventricular size is normal. There is normal pulmonary artery systolic  pressure. The estimated right ventricular systolic pressure is 33.8 mmHg.   3. Left atrial size was moderately dilated.   EKG personally reviewed by myself on todays visit EKG Interpretation Date/Time:  Tuesday July 02 2023 15:20:05 EDT Ventricular Rate:  71 PR Interval:    QRS Duration:  120 QT Interval:  432 QTC Calculation: 469 R Axis:   -41  Text Interpretation: Normal sinus rhythm with PACs and PVCs Left axis deviation Inferior infarct (cited on or before 17-Jun-2023) Anterior infarct , age undetermined When compared with ECG  of 17-Jun-2023 08:30, ST no longer depressed in Anterior leads T wave inversion no longer evident in Inferior leads Nonspecific T wave abnormality now evident in Lateral leads Reconfirmed by [name] [phone]) on 07/02/2023 3:28:21  PM    Zio monitor from April 2024 Frequent episodes of tachycardia, frequent PACs 408 Supraventricular Tachycardia runs occurred, the run with the fastest interval lasting 5 beats with a max rate of 146 bpm, the longest lasting 25.4 secs with an avg rate of 110 bpm.  History of bradycardia  past medical history reviewed history of left parieto-occipital hemorrhagic stroke  seen by ophthamologist 3/22 who did HVF test which showed right inf>sup quandrantopsia. felt this was a new finding [name] denied any new visual field deficits.  He has been driving Seen in the Er 05/06/5408 Head MRI: nothing acute  01/16/2020,  ED for syncope 3 alcoholic beverages that night, which was more than his usual  woke around 2 AM, and after entering the kitchen, had a syncopal episode  --received his second COVID-19 vaccination 4 days prior. usually spends some time on the edge of the bed and seated before standing, in order to prevent dizziness  daughter reports the fall / LOC woke her from sleep She found him slumped over on the floor.  he was cold to the touch and unable to speak  blood pressure was low.  EMS took his BP when they arrived and noted it was low.  MRA was without acute abnormality and showed mild chronic small vessel ischemic dz and moderate cerebral atrophy. chronic hemorrhagic L temporoparietal infarct.      fall while in [place] 2019 Was watching a football game, stood up and had a mechanical fall tripped over some shoes 10/24/2018 Suffered a hip fracture , was hospitalized  Following the surgery the physicians increased his lisinopril up to 20 mg daily and placed him on aspirin 325 mg daily He has started to have some upset stomach, wonders if it could be the high-dose aspirin  February 2018 he had acute vision changes, word finding difficulty. He was taken by life flight to [facility] hospital MRI/MRA showing acute stroke Details of scan as below: 1. Acute left parieto-occipital  intraparenchymal hematoma with adjacent subarachnoid hemorrhage. This most likely represents hemorrhagic transformation from an embolic infarct given its configuration. No underlying mass. No other findings to suggest amyloid angiopathy. 2. Normal MRA of the head.  He had a CT scan of the neck showing 50% stenosis on the left, 10% on the right   PMH:   has a past medical history of Acute embolic stroke (HCC) (02/01/2017), BPH (benign prostatic hyperplasia), Bradycardia (11/10/2015), Coronary artery disease, Dizziness (08/21/2012), DYSPEPSIA (07/31/2010), Hyperlipidemia (05/16/2010), Hypertension, HYPERTENSION, BENIGN (05/16/2010), and Stenosis of right carotid artery (02/01/2017).  PSH:    Past Surgical History:  Procedure Laterality Date   ESOPHAGOGASTRODUODENOSCOPY (EGD) WITH PROPOFOL N/A 01/08/2023   Procedure: ESOPHAGOGASTRODUODENOSCOPY (EGD) WITH PROPOFOL;  Surgeon: Midge Minium, MD;  Location: [facility] ENDOSCOPY;  Service: Endoscopy;  Laterality: N/A;   HIP SURGERY     right hip    Intestinal Blockage     LEFT HEART CATH AND CORONARY ANGIOGRAPHY N/A 06/15/2023   Procedure: LEFT HEART CATH AND CORONARY ANGIOGRAPHY;  Surgeon: Marykay Lex, MD;  Location: [facility] INVASIVE CV LAB;  Service: Cardiovascular;  Laterality: N/A;   LOOP RECORDER INSERTION N/A 05/14/2017   Procedure: Loop Recorder Insertion;  Surgeon: Duke Salvia, MD;  Location: [facility] INVASIVE CV LAB;  Service: Cardiovascular;  Laterality:  N/A;   SKIN CANCER EXCISION     x2   TONSILLECTOMY      Current Outpatient Medications  Medication Sig Dispense Refill   acetaminophen (TYLENOL) 650 MG CR tablet Take 650 mg by mouth every 8 (eight) hours as needed.      apixaban (ELIQUIS) 2.5 MG TABS tablet Take 1 tablet (2.5 mg total) by mouth 2 (two) times daily.     Ascorbic Acid (VITAMIN C) 1000 MG tablet Take 1,000 mg by mouth daily.     atorvastatin (LIPITOR) 40 MG tablet Take 1 tablet (40 mg total) by mouth daily. 30 tablet 3   b complex  vitamins tablet Take 1 tablet by mouth daily.     cholecalciferol (VITAMIN D) 1000 units tablet Take 2,000 Units by mouth daily.     clopidogrel (PLAVIX) 75 MG tablet Take 1 tablet (75 mg total) by mouth daily. 30 tablet 3   cyanocobalamin (VITAMIN B12) 1000 MCG tablet Take 1,000 mcg by mouth daily.     docusate sodium (COLACE) 100 MG capsule Take 100 mg by mouth 2 (two) times daily.     ezetimibe (ZETIA) 10 MG tablet Take 1 tablet (10 mg total) by mouth daily. 90 tablet 3   FLAXSEED, LINSEED, PO Take 2 g by mouth daily.     furosemide (LASIX) 20 MG tablet Take 20 mg by mouth daily.     isosorbide mononitrate (IMDUR) 30 MG 24 hr tablet Take 0.5 tablets (15 mg total) by mouth daily. 30 tablet 3   metoprolol succinate (TOPROL-XL) 25 MG 24 hr tablet Take 1 tablet by mouth daily.     nitroGLYCERIN (NITROSTAT) 0.4 MG SL tablet Place 1 tablet (0.4 mg total) under the tongue every 5 (five) minutes x 3 doses as needed for chest pain. 25 tablet 0   pantoprazole (PROTONIX) 40 MG tablet Take 1 tablet (40 mg total) by mouth 2 (two) times daily. 60 tablet 1   potassium chloride (KLOR-CON) 10 MEQ tablet Take 10 mEq by mouth daily.     pregabalin (LYRICA) 100 MG capsule Take 100 mg by mouth in the morning.     pregabalin (LYRICA) 150 MG capsule Take 150 mg by mouth at bedtime.     Probiotic Product (UP4 PROBIOTICS PO) Take 7.5 mg by mouth.     ranolazine (RANEXA) 500 MG 12 hr tablet Take 1 tablet (500 mg total) by mouth 2 (two) times daily. 60 tablet 3   tamsulosin (FLOMAX) 0.4 MG CAPS capsule Take 2 capsules (0.8 mg total) by mouth daily. 30 capsule 0   zinc sulfate 220 (50 Zn) MG capsule Take 1 tablet by mouth daily.     No current facility-administered medications for this visit.    Allergies:   Penicillins, Gabapentin, Penicillin g, and Codeine   Social History:  The patient  reports that he has never smoked. He has never used smokeless tobacco. He reports current alcohol use. He reports that he does  not use drugs.   Family History:   family history includes Cancer in an other family member; Hyperlipidemia in an other family member; Hypertension in his mother and another family member; Other in an other family member.    Review of Systems: Review of Systems  Constitutional: Negative.   HENT: Negative.    Respiratory: Negative.    Cardiovascular: Negative.   Gastrointestinal: Negative.   Musculoskeletal:  Positive for joint pain.  Neurological:  Positive for tremors.  Psychiatric/Behavioral:  Positive for memory loss.  All other systems reviewed and are negative.   PHYSICAL EXAM: VS:  BP (!) 112/90 (BP Location: Left Arm, Patient Position: Sitting, Cuff Size: Normal)   Pulse 71   Ht 5\' 8"  (1.727 m)   Wt 172 lb 4 oz (78.1 kg)   SpO2 98%   BMI 26.19 kg/m  , BMI Body mass index is 26.19 kg/m. Constitutional:  oriented to person, place, and time. No distress.  HENT:  Head: Grossly normal Eyes:  no discharge. No scleral icterus.  Neck: No JVD, no carotid bruits  Cardiovascular: Regular rate and rhythm, 3 appreciated 1-2/6 SEM RSB,   Pulmonary/Chest: Clear to auscultation bilaterally, no wheezes or rails Abdominal: Soft.  no distension.  no tenderness.  Musculoskeletal: Normal range of motion Neurological:  normal muscle tone. Coordination normal. No atrophy Skin: Skin warm and dry Psychiatric: normal affect, pleasant  Recent Labs: 01/08/2023: TSH 9.197 01/09/2023: ALT 16 05/11/2023: Magnesium 2.3 06/18/2023: BUN 21; Creatinine, Ser 1.30; Hemoglobin 11.7; Platelets 165; Potassium 4.1; Sodium 131 06/26/2023: B Natriuretic Peptide 460.1    Lipid Panel Lab Results  Component Value Date   CHOL 90 06/16/2023   HDL 40 (L) 06/16/2023   LDLCALC 44 06/16/2023   TRIG 30 06/16/2023    total chol  Wt Readings from Last 3 Encounters:  07/02/23 172 lb 4 oz (78.1 kg)  06/15/23 182 lb 9.6 oz (82.8 kg)  05/11/23 182 lb 1.6 oz (82.6 kg)     ASSESSMENT AND PLAN:  Atrial  fibrillation, Back to normal sinus rhythm on today's EKG Continue Eliquis 2.5 given recent climbing renal dysfunction and Plavix added On Ranexa, metoprolol succinate 25 daily  Bradycardia Metoprolol succinate 25 daily added in the hospital, heart rate on today's visit in the low 70s Will continue to monitor for now  Hyperlipidemia, unspecified hyperlipidemia type - Continue Lipitor 40 daily Strahl has been at goal  HYPERTENSION, BENIGN - Plan: EKG 12-Lead Blood pressure is well controlled on today's visit. No changes made to the medications.  Atherosclerosis of native coronary artery of native heart with chronic stable angina pectoris - Severe three-vessel coronary disease not amenable to intervention Was seen by interventional at [facility] Tolerating Ranexa, low-dose isosorbide, metoprolol Denies chest pain concerning for angina  syncope, unspecified syncope type - Plan: EKG 12-Lead Remote syncope No recent episodes of near syncope or syncope, blood pressure low but stable Will hold off on adding ACE/ARB  Acute embolic stroke [facility]) Previously wore loop monitor for 3 years with no significant arrhythmia Fibrillation noted on last office visit On Eliquis reduced dose for age, renal dysfunction  Stenosis of right carotid artery Nonobstructive disease, Cholesterol at goal  Essential tremor Followed by neurology  Cardiomyopathy Ejection fraction 35 to 40% after recent non-STEMI Metoprolol succinate, low-dose isosorbide, metoprolol succinate, Lasix 20 daily with potassium 10 daily Given low blood pressure and history of syncope, will hold off on restart of ACE/ARB/ARNI Recommend they monitor blood pressure closely at home and call us with numbers   Total encounter time more than 40 minutes  Greater than 50% was spent in counseling and coordination of care with the patient     Orders Placed This Encounter  Procedures   EKG 12-Lead     Signed, [name] Pollard, M.D.,  Ph.D. 07/02/2023  [facility] [place], [place] [phone]

## 2023-07-02 ENCOUNTER — Encounter: Payer: Self-pay | Admitting: Cardiovascular Disease

## 2023-07-02 ENCOUNTER — Ambulatory Visit: Payer: Medicare Other | Attending: Cardiovascular Disease | Admitting: Cardiovascular Disease

## 2023-07-02 VITALS — BP 112/90 | HR 71 | Ht 68.0 in | Wt 172.2 lb

## 2023-07-02 DIAGNOSIS — Z9861 Coronary angioplasty status: Secondary | ICD-10-CM | POA: Insufficient documentation

## 2023-07-02 DIAGNOSIS — I2510 Atherosclerotic heart disease of native coronary artery without angina pectoris: Secondary | ICD-10-CM

## 2023-07-02 DIAGNOSIS — R002 Palpitations: Secondary | ICD-10-CM

## 2023-07-02 DIAGNOSIS — I739 Peripheral vascular disease, unspecified: Secondary | ICD-10-CM | POA: Diagnosis present

## 2023-07-02 DIAGNOSIS — Z79899 Other long term (current) drug therapy: Secondary | ICD-10-CM

## 2023-07-02 DIAGNOSIS — I495 Sick sinus syndrome: Secondary | ICD-10-CM

## 2023-07-02 DIAGNOSIS — I25110 Atherosclerotic heart disease of native coronary artery with unstable angina pectoris: Secondary | ICD-10-CM | POA: Diagnosis not present

## 2023-07-02 MED ORDER — METOPROLOL SUCCINATE ER 25 MG PO TB24: 25.0000 mg | ORAL_TABLET | Freq: Every day | ORAL | 11 refills | Status: DC

## 2023-07-02 MED ORDER — FUROSEMIDE 20 MG PO TABS: 20.0000 mg | ORAL_TABLET | Freq: Every day | ORAL | 3 refills | Status: DC

## 2023-07-02 MED ORDER — ATORVASTATIN CALCIUM 40 MG PO TABS: 40.0000 mg | ORAL_TABLET | Freq: Every day | ORAL | 3 refills | Status: DC

## 2023-07-02 MED ORDER — POTASSIUM CHLORIDE ER 10 MEQ PO TBCR: 10.0000 meq | EXTENDED_RELEASE_TABLET | Freq: Every day | ORAL | 3 refills | Status: DC

## 2023-07-02 MED ORDER — ISOSORBIDE MONONITRATE ER 30 MG PO TB24: 15.0000 mg | ORAL_TABLET | Freq: Every day | ORAL | 3 refills | Status: DC

## 2023-07-02 MED ORDER — CLOPIDOGREL BISULFATE 75 MG PO TABS: 75.0000 mg | ORAL_TABLET | Freq: Every day | ORAL | 3 refills | Status: DC

## 2023-07-02 MED ORDER — RANOLAZINE ER 500 MG PO TB12: 500.0000 mg | ORAL_TABLET | Freq: Two times a day (BID) | ORAL | 3 refills | Status: DC

## 2023-07-02 MED ORDER — APIXABAN 2.5 MG PO TABS: 2.5000 mg | ORAL_TABLET | Freq: Two times a day (BID) | ORAL | 3 refills | Status: DC

## 2023-07-02 MED ORDER — EZETIMIBE 10 MG PO TABS: 10.0000 mg | ORAL_TABLET | Freq: Every day | ORAL | 3 refills | Status: DC

## 2023-07-02 NOTE — Patient Instructions (Addendum)
Refill to [facility]  Medication Instructions:  No changes  If you need a refill on your cardiac medications before your next appointment, please call your pharmacy.   Lab work: Your provider would like for you to return in 3 weeks to have the following labs drawn: BMP.   Please go to [facility] [address] ([address]) [address], [place] [address] You do not need an appointment.  They are open from 7:30 am-4 pm.  Lunch from 1:00 pm- 2:00 pm    You may also go to any of these [facility] locations:  [employer]  - [address] Chief Technology Officer)      Testing/Procedures:  Your physician has requested that you have a lower extremity arterial duplex. During this test, ultrasound is used to evaluate arterial blood flow in the legs. Allow one hour for this exam. There are no restrictions or special instructions. This will take place at [address] ([address]) [address], [place] [address]   Your physician has requested that you have an ankle brachial index (ABI). During this test an ultrasound and blood pressure cuff are used to evaluate the arteries that supply the arms and legs with blood.  Allow thirty minutes for this exam.  There are no restrictions or special instructions.  This will take place at [address] ([facility] [address]) [address], [place] [address]   Follow-Up: At [facility], you and your health needs are our priority.  As part of our continuing mission to provide you with exceptional heart care, we have created designated Provider Care Teams.  These Care Teams include your primary Cardiologist (physician) and Advanced Practice Providers (APPs -  Physician Assistants and Nurse Practitioners) who all work together to provide you with the care you need, when you need it.  You will need a follow up appointment in 3 months  Providers on your designated Care Team:   Nicolasa Ducking, NP Eula Listen, PA-C Cadence Fransico Michael, [place]  COVID-19 Vaccine Information can be found at: [email or web address] For questions related to vaccine distribution or appointments, please email [email or web address] or call [phone].

## 2023-07-04 ENCOUNTER — Ambulatory Visit (INDEPENDENT_AMBULATORY_CARE_PROVIDER_SITE_OTHER): Payer: Medicare Other | Admitting: Podiatry

## 2023-07-04 ENCOUNTER — Encounter: Payer: Medicare Other | Attending: Cardiovascular Disease | Admitting: *Deleted

## 2023-07-04 DIAGNOSIS — L97521 Non-pressure chronic ulcer of other part of left foot limited to breakdown of skin: Secondary | ICD-10-CM | POA: Diagnosis not present

## 2023-07-04 DIAGNOSIS — Z48812 Encounter for surgical aftercare following surgery on the circulatory system: Secondary | ICD-10-CM | POA: Insufficient documentation

## 2023-07-04 DIAGNOSIS — I214 Non-ST elevation (NSTEMI) myocardial infarction: Secondary | ICD-10-CM

## 2023-07-04 DIAGNOSIS — I252 Old myocardial infarction: Secondary | ICD-10-CM | POA: Insufficient documentation

## 2023-07-04 NOTE — Progress Notes (Signed)
Initial phone call completed. Diagnosis can be found in [facility] 7/13. EP Orientation scheduled for Monday 8/12 at 10:30.

## 2023-07-04 NOTE — Progress Notes (Signed)
Subjective:  Patient ID: Calvin Amber., male    DOB: 25-Jun-1933,  MRN: [id number]  Chief Complaint  Patient presents with   Nail Problem    89 y.o. male presents with the above complaint.  Patient presents with follow-up of left third digit preulcerative callus wanted get eval make sure that there is no ulceration denies any other acute complaints   Review of Systems: Negative except as noted in the HPI. Denies N/V/F/Ch.  Past Medical History:  Diagnosis Date   Acute embolic stroke (HCC) 02/01/2017   BPH (benign prostatic hyperplasia)    Bradycardia 11/10/2015   Coronary artery disease    Dizziness 08/21/2012   DYSPEPSIA 07/31/2010   Qualifier: Diagnosis of  By: Janee Morn RN, Morrie Sheldon     Hyperlipidemia 05/16/2010   Qualifier: Diagnosis of  By: Mariah Milling MD, [name]      Hypertension    HYPERTENSION, BENIGN 05/16/2010   Qualifier: Diagnosis of  By: Mariah Milling MD, [name]     Stenosis of right carotid artery 02/01/2017    Current Outpatient Medications:    acetaminophen (TYLENOL) 650 MG CR tablet, Take 650 mg by mouth every 8 (eight) hours as needed. , Disp: , Rfl:    apixaban (ELIQUIS) 2.5 MG TABS tablet, Take 1 tablet (2.5 mg total) by mouth 2 (two) times daily., Disp: 180 tablet, Rfl: 3   Ascorbic Acid (VITAMIN C) 1000 MG tablet, Take 1,000 mg by mouth daily., Disp: , Rfl:    atorvastatin (LIPITOR) 40 MG tablet, Take 1 tablet (40 mg total) by mouth daily., Disp: 90 tablet, Rfl: 3   b complex vitamins tablet, Take 1 tablet by mouth daily., Disp: , Rfl:    cholecalciferol (VITAMIN D) 1000 units tablet, Take 2,000 Units by mouth daily., Disp: , Rfl:    clopidogrel (PLAVIX) 75 MG tablet, Take 1 tablet (75 mg total) by mouth daily., Disp: 90 tablet, Rfl: 3   cyanocobalamin (VITAMIN B12) 1000 MCG tablet, Take 1,000 mcg by mouth daily., Disp: , Rfl:    docusate sodium (COLACE) 100 MG capsule, Take 100 mg by mouth 2 (two) times daily., Disp: , Rfl:    ezetimibe (ZETIA) 10 MG tablet, Take 1  tablet (10 mg total) by mouth daily., Disp: 90 tablet, Rfl: 3   FLAXSEED, LINSEED, PO, Take 2 g by mouth daily., Disp: , Rfl:    furosemide (LASIX) 20 MG tablet, Take 1 tablet (20 mg total) by mouth daily., Disp: 90 tablet, Rfl: 3   isosorbide mononitrate (IMDUR) 30 MG 24 hr tablet, Take 0.5 tablets (15 mg total) by mouth daily., Disp: 90 tablet, Rfl: 3   metoprolol succinate (TOPROL-XL) 25 MG 24 hr tablet, Take 1 tablet (25 mg total) by mouth daily., Disp: 30 tablet, Rfl: 11   nitroGLYCERIN (NITROSTAT) 0.4 MG SL tablet, Place 1 tablet (0.4 mg total) under the tongue every 5 (five) minutes x 3 doses as needed for chest pain., Disp: 25 tablet, Rfl: 0   pantoprazole (PROTONIX) 40 MG tablet, Take 1 tablet (40 mg total) by mouth 2 (two) times daily., Disp: 60 tablet, Rfl: 1   potassium chloride (KLOR-CON) 10 MEQ tablet, Take 1 tablet (10 mEq total) by mouth daily., Disp: 90 tablet, Rfl: 3   pregabalin (LYRICA) 100 MG capsule, Take 100 mg by mouth in the morning., Disp: , Rfl:    pregabalin (LYRICA) 150 MG capsule, Take 150 mg by mouth at bedtime., Disp: , Rfl:    Probiotic Product (UP4 PROBIOTICS PO), Take 7.5 mg by  mouth., Disp: , Rfl:    ranolazine (RANEXA) 500 MG 12 hr tablet, Take 1 tablet (500 mg total) by mouth 2 (two) times daily., Disp: 180 tablet, Rfl: 3   tamsulosin (FLOMAX) 0.4 MG CAPS capsule, Take 2 capsules (0.8 mg total) by mouth daily., Disp: 30 capsule, Rfl: 0   zinc sulfate 220 (50 Zn) MG capsule, Take 1 tablet by mouth daily., Disp: , Rfl:   Social History   Tobacco Use  Smoking Status Never  Smokeless Tobacco Never    Allergies  Allergen Reactions   Penicillins Rash    Rash    Rash    Gabapentin    Penicillin G    Codeine Nausea Only and Nausea And Vomiting    Other reaction(s): Vomiting Other reaction(s): Vomiting   Objective:  There were no vitals filed for this visit. There is no height or weight on file to calculate BMI. Constitutional Well developed. Well  nourished.  Vascular Dorsalis pedis pulses faint the palpable bilaterally. Posterior tibial pulses faint the palpable bilaterally. Capillary refill limb-to all digits.  No cyanosis or clubbing noted. Pedal hair growth normal.  Neurologic Normal speech. Oriented to person, place, and time. Epicritic sensation to light touch grossly present bilaterally.  Dermatologic Left third digit ulceration preulcerative callus without breakdown.  There is no redness present.  No clinical signs of infection no purulent drainage does not probe down to bone.  Orthopedic: Normal joint ROM without pain or crepitus bilaterally. No visible deformities. No bony tenderness.   Radiographs: 3 views of skeletally mature adult left foot:No radiographic notes of cortical structure noted to the third digit.  No soft tissue emphysema noted no bony abnormalities identified Assessment:   1. Skin ulcer of toe of left foot, limited to breakdown of skin (HCC)     Plan:  Patient was evaluated and treated and all questions answered.  Left third digit preulcerative callus -No debridement was necessary as this mostly granular in nature -Encouraged him to continue wearing surgical shoe to help offload the toe -Patient is awaiting ABIs -Continue doing Betadine wet-to-dry -Clinically no signs of ulceration noted at this time.  I continue to discuss with the patient shoe gear modification and  No follow-ups on file.

## 2023-07-15 VITALS — Ht 68.6 in | Wt 174.0 lb

## 2023-07-15 DIAGNOSIS — I214 Non-ST elevation (NSTEMI) myocardial infarction: Secondary | ICD-10-CM

## 2023-07-15 DIAGNOSIS — Z48812 Encounter for surgical aftercare following surgery on the circulatory system: Secondary | ICD-10-CM | POA: Diagnosis not present

## 2023-07-15 DIAGNOSIS — I252 Old myocardial infarction: Secondary | ICD-10-CM | POA: Diagnosis present

## 2023-07-15 NOTE — Patient Instructions (Signed)
Patient Instructions  Patient Details  Name: [name]. MRN: [id number] Date of Birth: 1933/11/11 Referring Provider:  Marykay Lex, MD  Below are your personal goals for exercise, nutrition, and risk factors. Our goal is to help you stay on track towards obtaining and maintaining these goals. We will be discussing your progress on these goals with you throughout the program.  Initial Exercise Prescription:  Initial Exercise Prescription - 07/15/23 1500       Recumbant Bike   RPM 50    Watts 15    Minutes 15    METs 1.03      NuStep   Level 1    SPM 80    Minutes 15    METs 1.03      Track   Laps 10    Minutes 15    METs 1.54      Prescription Details   Frequency (times per week) 3    Duration Progress to 30 minutes of continuous aerobic without signs/symptoms of physical distress      Intensity   THRR 40-80% of Max Heartrate 103-123    Ratings of Perceived Exertion 11-13    Perceived Dyspnea 0-4      Progression   Progression Continue to progress workloads to maintain intensity without signs/symptoms of physical distress.      Resistance Training   Training Prescription Yes    Weight 3lb    Reps 10-15             Exercise Goals: Frequency: Be able to perform aerobic exercise two to three times per week in program working toward 2-5 days per week of home exercise.  Intensity: Work with a perceived exertion of 11 (fairly light) - 15 (hard) while following your exercise prescription.  We will make changes to your prescription with you as you progress through the program.   Duration: Be able to do 30 to 45 minutes of continuous aerobic exercise in addition to a 5 minute warm-up and a 5 minute cool-down routine.   Nutrition Goals: Your personal nutrition goals will be established when you do your nutrition analysis with the dietician.  The following are general nutrition guidelines to follow: Cholesterol < 200mg /day Sodium <  1500mg /day Fiber: Men over 50 yrs - 30 grams per day  Personal Goals:  Personal Goals and Risk Factors at Admission - 07/04/23 1340       Core Components/Risk Factors/Patient Goals on Admission   Heart Failure Yes    Intervention Provide a combined exercise and nutrition program that is supplemented with education, support and counseling about heart failure. Directed toward relieving symptoms such as shortness of breath, decreased exercise tolerance, and extremity edema.    Expected Outcomes Improve functional capacity of life;Short term: Attendance in program 2-3 days a week with increased exercise capacity. Reported lower sodium intake. Reported increased fruit and vegetable intake. Reports medication compliance.;Long term: Adoption of self-care skills and reduction of barriers for early signs and symptoms recognition and intervention leading to self-care maintenance.;Short term: Daily weights obtained and reported for increase. Utilizing diuretic protocols set by physician.    Hypertension Yes    Intervention Provide education on lifestyle modifcations including regular physical activity/exercise, weight management, moderate sodium restriction and increased consumption of fresh fruit, vegetables, and low fat dairy, alcohol moderation, and smoking cessation.;Monitor prescription use compliance.    Expected Outcomes Short Term: Continued assessment and intervention until BP is < 140/65mm HG in hypertensive participants. < 130/66mm HG in  hypertensive participants with diabetes, heart failure or chronic kidney disease.;Long Term: Maintenance of blood pressure at goal levels.    Lipids Yes    Intervention Provide education and support for participant on nutrition & aerobic/resistive exercise along with prescribed medications to achieve LDL 70mg , HDL >40mg .    Expected Outcomes Short Term: Participant states understanding of desired cholesterol values and is compliant with medications prescribed.  Participant is following exercise prescription and nutrition guidelines.;Long Term: Cholesterol controlled with medications as prescribed, with individualized exercise RX and with personalized nutrition plan. Value goals: LDL < 70mg , HDL > 40 mg.            Exercise Goals and Review:  Exercise Goals     Row Name 07/15/23 1515             Exercise Goals   Increase Physical Activity Yes       Expected Outcomes Short Term: Attend rehab on a regular basis to increase amount of physical activity.;Long Term: Add in home exercise to make exercise part of routine and to increase amount of physical activity.;Long Term: Exercising regularly at least 3-5 days a week.       Increase Strength and Stamina Yes       Expected Outcomes Short Term: Increase workloads from initial exercise prescription for resistance, speed, and METs.;Short Term: Perform resistance training exercises routinely during rehab and add in resistance training at home;Long Term: Improve cardiorespiratory fitness, muscular endurance and strength as measured by increased METs and functional capacity ( )       Able to understand and use rate of perceived exertion (RPE) scale Yes       Intervention Provide education and explanation on how to use RPE scale       Expected Outcomes Short Term: Able to use RPE daily in rehab to express subjective intensity level;Long Term:  Able to use RPE to guide intensity level when exercising independently       Able to understand and use Dyspnea scale Yes       Intervention Provide education and explanation on how to use Dyspnea scale       Expected Outcomes Short Term: Able to use Dyspnea scale daily in rehab to express subjective sense of shortness of breath during exertion;Long Term: Able to use Dyspnea scale to guide intensity level when exercising independently       Knowledge and understanding of Target Heart Rate Range (THRR) Yes       Intervention Provide education and explanation of THRR  including how the numbers were predicted and where they are located for reference       Expected Outcomes Short Term: Able to state/look up THRR;Short Term: Able to use daily as guideline for intensity in rehab;Long Term: Able to use THRR to govern intensity when exercising independently       Able to check pulse independently Yes       Intervention Provide education and demonstration on how to check pulse in carotid and radial arteries.;Review the importance of being able to check your own pulse for safety during independent exercise       Expected Outcomes Short Term: Able to explain why pulse checking is important during independent exercise;Long Term: Able to check pulse independently and accurately       Understanding of Exercise Prescription Yes       Intervention Provide education, explanation, and written materials on patient's individual exercise prescription       Expected Outcomes Short Term: Able  to explain program exercise prescription;Long Term: Able to explain home exercise prescription to exercise independently

## 2023-07-15 NOTE — Progress Notes (Signed)
Cardiac Individual Treatment Plan  Patient Details  Name: [name]. MRN: [id number] Date of Birth: 05/13/1933 Referring Provider:    Initial Encounter Date:   Visit Diagnosis: NSTEMI (non-ST elevated myocardial infarction) (HCC)  Patient's Home Medications on Admission:  Current Outpatient Medications:    acetaminophen (TYLENOL) 650 MG CR tablet, Take 650 mg by mouth every 8 (eight) hours as needed. , Disp: , Rfl:    apixaban (ELIQUIS) 2.5 MG TABS tablet, Take 1 tablet (2.5 mg total) by mouth 2 (two) times daily., Disp: 180 tablet, Rfl: 3   Ascorbic Acid (VITAMIN C) 1000 MG tablet, Take 1,000 mg by mouth daily., Disp: , Rfl:    atorvastatin (LIPITOR) 40 MG tablet, Take 1 tablet (40 mg total) by mouth daily., Disp: 90 tablet, Rfl: 3   b complex vitamins tablet, Take 1 tablet by mouth daily., Disp: , Rfl:    cholecalciferol (VITAMIN D) 1000 units tablet, Take 2,000 Units by mouth daily., Disp: , Rfl:    clopidogrel (PLAVIX) 75 MG tablet, Take 1 tablet (75 mg total) by mouth daily., Disp: 90 tablet, Rfl: 3   cyanocobalamin (VITAMIN B12) 1000 MCG tablet, Take 1,000 mcg by mouth daily., Disp: , Rfl:    docusate sodium (COLACE) 100 MG capsule, Take 100 mg by mouth 2 (two) times daily., Disp: , Rfl:    ezetimibe (ZETIA) 10 MG tablet, Take 1 tablet (10 mg total) by mouth daily., Disp: 90 tablet, Rfl: 3   FLAXSEED, LINSEED, PO, Take 2 g by mouth daily., Disp: , Rfl:    furosemide (LASIX) 20 MG tablet, Take 1 tablet (20 mg total) by mouth daily., Disp: 90 tablet, Rfl: 3   isosorbide mononitrate (IMDUR) 30 MG 24 hr tablet, Take 0.5 tablets (15 mg total) by mouth daily., Disp: 90 tablet, Rfl: 3   metoprolol succinate (TOPROL-XL) 25 MG 24 hr tablet, Take 1 tablet (25 mg total) by mouth daily., Disp: 30 tablet, Rfl: 11   nitroGLYCERIN (NITROSTAT) 0.4 MG SL tablet, Place 1 tablet (0.4 mg total) under the tongue every 5 (five) minutes x 3 doses as needed for chest pain., Disp: 25 tablet,  Rfl: 0   pantoprazole (PROTONIX) 40 MG tablet, Take 1 tablet (40 mg total) by mouth 2 (two) times daily., Disp: 60 tablet, Rfl: 1   potassium chloride (KLOR-CON) 10 MEQ tablet, Take 1 tablet (10 mEq total) by mouth daily., Disp: 90 tablet, Rfl: 3   pregabalin (LYRICA) 100 MG capsule, Take 100 mg by mouth in the morning., Disp: , Rfl:    pregabalin (LYRICA) 150 MG capsule, Take 150 mg by mouth at bedtime., Disp: , Rfl:    Probiotic Product (UP4 PROBIOTICS PO), Take 7.5 mg by mouth., Disp: , Rfl:    ranolazine (RANEXA) 500 MG 12 hr tablet, Take 1 tablet (500 mg total) by mouth 2 (two) times daily., Disp: 180 tablet, Rfl: 3   tamsulosin (FLOMAX) 0.4 MG CAPS capsule, Take 2 capsules (0.8 mg total) by mouth daily., Disp: 30 capsule, Rfl: 0   zinc sulfate 220 (50 Zn) MG capsule, Take 1 tablet by mouth daily., Disp: , Rfl:   Past Medical History: Past Medical History:  Diagnosis Date   Acute embolic stroke (HCC) 02/01/2017   BPH (benign prostatic hyperplasia)    Bradycardia 11/10/2015   Coronary artery disease    Dizziness 08/21/2012   DYSPEPSIA 07/31/2010   Qualifier: Diagnosis of  By: Janee Morn RN, Morrie Sheldon     Hyperlipidemia 05/16/2010   Qualifier: Diagnosis of  ByMariah Milling MD, [name]      Hypertension    HYPERTENSION, BENIGN 05/16/2010   Qualifier: Diagnosis of  By: Mariah Milling MD, [name]     Stenosis of right carotid artery 02/01/2017    Tobacco Use: Social History   Tobacco Use  Smoking Status Never  Smokeless Tobacco Never    Labs: Review Flowsheet       Latest Ref Rng & Units 01/23/2010 06/15/2023 06/16/2023  Labs for ITP Cardiac and Pulmonary Rehab  Cholestrol 0 - 200 mg/dL 981  - 90   LDL (calc) 0 - 99 mg/dL 19.1  - 44   HDL-C >47 mg/dL 82.9  - 40   Trlycerides <150 mg/dL 93  - 30   Hemoglobin F6O 4.8 - 5.6 % 5.4  6.3  6.3  -    Details       Multiple values from one day are sorted in reverse-chronological order          Exercise Target Goals: Exercise Program Goal: Individual  exercise prescription set using results from initial 6 min walk test and THRR while considering  patient's activity barriers and safety.   Exercise Prescription Goal: Initial exercise prescription builds to 30-45 minutes a day of aerobic activity, 2-3 days per week.  Home exercise guidelines will be given to patient during program as part of exercise prescription that the participant will acknowledge.   Education: Aerobic Exercise: - Group verbal and visual presentation on the components of exercise prescription. Introduces F.I.T.T principle from ACSM for exercise prescriptions.  Reviews F.I.T.T. principles of aerobic exercise including progression. Written material given at graduation. Flowsheet Row Cardiac Rehab from 07/15/2023 in [facility] Cardiac and Pulmonary Rehab  Education need identified 07/15/23       Education: Resistance Exercise: - Group verbal and visual presentation on the components of exercise prescription. Introduces F.I.T.T principle from ACSM for exercise prescriptions  Reviews F.I.T.T. principles of resistance exercise including progression. Written material given at graduation.    Education: Exercise & Equipment Safety: - Individual verbal instruction and demonstration of equipment use and safety with use of the equipment. Flowsheet Row Cardiac Rehab from 07/15/2023 in [facility] Cardiac and Pulmonary Rehab  Date 07/15/23  Educator [facility]  Instruction Review Code 1- Verbalizes Understanding       Education: Exercise Physiology & General Exercise Guidelines: - Group verbal and written instruction with models to review the exercise physiology of the cardiovascular system and associated critical values. Provides general exercise guidelines with specific guidelines to those with heart or lung disease.    Education: Flexibility, Balance, Mind/Body Relaxation: - Group verbal and visual presentation with interactive activity on the components of exercise prescription. Introduces  F.I.T.T principle from ACSM for exercise prescriptions. Reviews F.I.T.T. principles of flexibility and balance exercise training including progression. Also discusses the mind body connection.  Reviews various relaxation techniques to help reduce and manage stress (i.e. Deep breathing, progressive muscle relaxation, and visualization). Balance handout provided to take home. Written material given at graduation.   Activity Barriers & Risk Stratification:  Activity Barriers & Cardiac Risk Stratification - 07/15/23 1443       Activity Barriers & Cardiac Risk Stratification   Activity Barriers Arthritis;Back Problems;Balance Concerns;History of Falls;Muscular Weakness;Other (comment)    Comments Tremors in hands, neuropathy in feet    Cardiac Risk Stratification High             6 Minute Walk:  6 Minute Walk     Row Name 07/15/23 1438  6 Minute Walk   Phase Initial     Distance 780 feet     Walk Time 6 minutes     # of Rest Breaks 0     MPH 1.5     METS 1.03     RPE 13     Perceived Dyspnea  1     VO2 Peak 3.6     Symptoms No     Resting HR 83 bpm     Resting BP 100/56     Resting Oxygen Saturation  97 %     Exercise Oxygen Saturation  during 6 min walk 100 %     Max Ex. HR 93 bpm     Max Ex. BP 120/60     2 Minute Post BP 120/62              Oxygen Initial Assessment:   Oxygen Re-Evaluation:   Oxygen Discharge (Final Oxygen Re-Evaluation):   Initial Exercise Prescription:  Initial Exercise Prescription - 07/15/23 1500       Recumbant Bike   RPM 50    Watts 15    Minutes 15    METs 1.03      NuStep   Level 1    SPM 80    Minutes 15    METs 1.03      Track   Laps 10    Minutes 15    METs 1.54      Prescription Details   Frequency (times per week) 3    Duration Progress to 30 minutes of continuous aerobic without signs/symptoms of physical distress      Intensity   THRR 40-80% of Max Heartrate 103-123    Ratings of Perceived  Exertion 11-13    Perceived Dyspnea 0-4      Progression   Progression Continue to progress workloads to maintain intensity without signs/symptoms of physical distress.      Resistance Training   Training Prescription Yes    Weight 3lb    Reps 10-15             Perform Capillary Blood Glucose checks as needed.  Exercise Prescription Changes:   Exercise Prescription Changes     Row Name 07/15/23 1500             Response to Exercise   Blood Pressure (Admit) 100/56       Blood Pressure (Exercise) 120/60       Blood Pressure (Exit) 120/62       Heart Rate (Admit) 83 bpm       Heart Rate (Exercise) 93 bpm       Heart Rate (Exit) 71 bpm       Oxygen Saturation (Admit) 97 %       Oxygen Saturation (Exercise) 100 %       Rating of Perceived Exertion (Exercise) 13       Perceived Dyspnea (Exercise) 1       Symptoms None       Comments results                Exercise Comments:   Exercise Goals and Review:   Exercise Goals     Row Name 07/15/23 1515             Exercise Goals   Increase Physical Activity Yes       Expected Outcomes Short Term: Attend rehab on a regular basis to increase amount of physical activity.;Long Term: Add in home  exercise to make exercise part of routine and to increase amount of physical activity.;Long Term: Exercising regularly at least 3-5 days a week.       Increase Strength and Stamina Yes       Expected Outcomes Short Term: Increase workloads from initial exercise prescription for resistance, speed, and METs.;Short Term: Perform resistance training exercises routinely during rehab and add in resistance training at home;Long Term: Improve cardiorespiratory fitness, muscular endurance and strength as measured by increased METs and functional capacity ( )       Able to understand and use rate of perceived exertion (RPE) scale Yes       Intervention Provide education and explanation on how to use RPE scale       Expected  Outcomes Short Term: Able to use RPE daily in rehab to express subjective intensity level;Long Term:  Able to use RPE to guide intensity level when exercising independently       Able to understand and use Dyspnea scale Yes       Intervention Provide education and explanation on how to use Dyspnea scale       Expected Outcomes Short Term: Able to use Dyspnea scale daily in rehab to express subjective sense of shortness of breath during exertion;Long Term: Able to use Dyspnea scale to guide intensity level when exercising independently       Knowledge and understanding of Target Heart Rate Range (THRR) Yes       Intervention Provide education and explanation of THRR including how the numbers were predicted and where they are located for reference       Expected Outcomes Short Term: Able to state/look up THRR;Short Term: Able to use daily as guideline for intensity in rehab;Long Term: Able to use THRR to govern intensity when exercising independently       Able to check pulse independently Yes       Intervention Provide education and demonstration on how to check pulse in carotid and radial arteries.;Review the importance of being able to check your own pulse for safety during independent exercise       Expected Outcomes Short Term: Able to explain why pulse checking is important during independent exercise;Long Term: Able to check pulse independently and accurately       Understanding of Exercise Prescription Yes       Intervention Provide education, explanation, and written materials on patient's individual exercise prescription       Expected Outcomes Short Term: Able to explain program exercise prescription;Long Term: Able to explain home exercise prescription to exercise independently                Exercise Goals Re-Evaluation :   Discharge Exercise Prescription (Final Exercise Prescription Changes):  Exercise Prescription Changes - 07/15/23 1500       Response to Exercise   Blood  Pressure (Admit) 100/56    Blood Pressure (Exercise) 120/60    Blood Pressure (Exit) 120/62    Heart Rate (Admit) 83 bpm    Heart Rate (Exercise) 93 bpm    Heart Rate (Exit) 71 bpm    Oxygen Saturation (Admit) 97 %    Oxygen Saturation (Exercise) 100 %    Rating of Perceived Exertion (Exercise) 13    Perceived Dyspnea (Exercise) 1    Symptoms None    Comments results             Nutrition:  Target Goals: Understanding of nutrition guidelines, daily intake of sodium 1500mg , cholesterol 200mg ,  calories 30% from fat and 7% or less from saturated fats, daily to have 5 or more servings of fruits and vegetables.  Education: All About Nutrition: -Group instruction provided by verbal, written material, interactive activities, discussions, models, and posters to present general guidelines for heart healthy nutrition including fat, fiber, MyPlate, the role of sodium in heart healthy nutrition, utilization of the nutrition label, and utilization of this knowledge for meal planning. Follow up email sent as well. Written material given at graduation. Flowsheet Row Cardiac Rehab from 07/15/2023 in [facility] Cardiac and Pulmonary Rehab  Education need identified 07/15/23       Biometrics:  Pre Biometrics - 07/15/23 1517       Pre Biometrics   Height 5' 8.6" (1.742 m)    Weight 174 lb (78.9 kg)    Waist Circumference 43.5 inches    Hip Circumference 39.2 inches    Waist to Hip Ratio 1.11 %    BMI (Calculated) 26.01    Single Leg Stand 0 seconds              Nutrition Therapy Plan and Nutrition Goals:   Nutrition Assessments:  MEDIFICTS Score Key: ?70 Need to make dietary changes  40-70 Heart Healthy Diet ? 40 Therapeutic Level Cholesterol Diet  Flowsheet Row Cardiac Rehab from 07/15/2023 in [facility] Cardiac and Pulmonary Rehab  Picture Your Plate Total Score on Admission 77      Picture Your Plate Scores: <16 Unhealthy dietary pattern with much room for  improvement. 41-50 Dietary pattern unlikely to meet recommendations for good health and room for improvement. 51-60 More healthful dietary pattern, with some room for improvement.  >60 Healthy dietary pattern, although there may be some specific behaviors that could be improved.    Nutrition Goals Re-Evaluation:   Nutrition Goals Discharge (Final Nutrition Goals Re-Evaluation):   Psychosocial: Target Goals: Acknowledge presence or absence of significant depression and/or stress, maximize coping skills, provide positive support system. Participant is able to verbalize types and ability to use techniques and skills needed for reducing stress and depression.   Education: Stress, Anxiety, and Depression - Group verbal and visual presentation to define topics covered.  Reviews how body is impacted by stress, anxiety, and depression.  Also discusses healthy ways to reduce stress and to treat/manage anxiety and depression.  Written material given at graduation. Flowsheet Row Cardiac Rehab from 07/15/2023 in [facility] Cardiac and Pulmonary Rehab  Education need identified 07/15/23       Education: Sleep Hygiene -Provides group verbal and written instruction about how sleep can affect your health.  Define sleep hygiene, discuss sleep cycles and impact of sleep habits. Review good sleep hygiene tips.    Initial Review & Psychosocial Screening:  Initial Psych Review & Screening - 07/04/23 1342       Initial Review   Current issues with Current Stress Concerns    Source of Stress Concerns Unable to participate in former interests or hobbies      Family Dynamics   Good Support System? Yes      Barriers   Psychosocial barriers to participate in program There are no identifiable barriers or psychosocial needs.;The patient should benefit from training in stress management and relaxation.      Screening Interventions   Interventions Encouraged to exercise;Provide feedback about the scores to  participant;To provide support and resources with identified psychosocial needs    Expected Outcomes Short Term goal: Utilizing psychosocial counselor, staff and physician to assist with identification of specific Stressors  or current issues interfering with healing process. Setting desired goal for each stressor or current issue identified.;Long Term Goal: Stressors or current issues are controlled or eliminated.;Short Term goal: Identification and review with participant of any Quality of Life or Depression concerns found by scoring the questionnaire.;Long Term goal: The participant improves quality of Life and PHQ9 Scores as seen by post scores and/or verbalization of changes             Quality of Life Scores:   Quality of Life - 07/15/23 1526       Quality of Life   Select Quality of Life      Quality of Life Scores   Health/Function Pre 20.14 %    Socioeconomic Pre 30 %    Psych/Spiritual Pre 30 %    Family Pre 30 %    GLOBAL Pre 25.69 %            Scores of 19 and below usually indicate a poorer quality of life in these areas.  A difference of  2-3 points is a clinically meaningful difference.  A difference of 2-3 points in the total score of the Quality of Life Index has been associated with significant improvement in overall quality of life, self-image, physical symptoms, and general health in studies assessing change in quality of life.  PHQ-9: Review Flowsheet       07/15/2023  Depression screen PHQ 2/9  Decreased Interest 0  Down, Depressed, Hopeless 0  PHQ - 2 Score 0  Altered sleeping 0  Tired, decreased energy 2  Change in appetite 1  Feeling bad or failure about yourself  0  Trouble concentrating 3  Moving slowly or fidgety/restless 0  Suicidal thoughts 0  PHQ-9 Score 6  Difficult doing work/chores Somewhat difficult    Details           Interpretation of Total Score  Total Score Depression Severity:  1-4 = Minimal depression, 5-9 = Mild  depression, 10-14 = Moderate depression, 15-19 = Moderately severe depression, 20-27 = Severe depression   Psychosocial Evaluation and Intervention:  Psychosocial Evaluation - 07/04/23 1344       Psychosocial Evaluation & Interventions   Interventions Encouraged to exercise with the program and follow exercise prescription    Comments Mr. Anastasiou is coming to cardiac rehab after a NSTEMI. He had a stroke 5 years ago that caused a difficulty in word processing, so his wife mainly spoke on the phone during the orientation phone call. His main motivation for coming to the program is to get back to what he had been doing. He has felt weaker since his MI, so he wants to regain his strength back. His wife and he are worried about him falling because of his weakness, so he is ready to work on his stamina as well. He has a good support system. He reports no sleep concerns or concerns with anxiety or depression.    Expected Outcomes Short: attend cardiac rehab for education and exercise. Long: devleop and maintain positive self care habits    Continue Psychosocial Services  Follow up required by staff             Psychosocial Re-Evaluation:   Psychosocial Discharge (Final Psychosocial Re-Evaluation):   Vocational Rehabilitation: Provide vocational rehab assistance to qualifying candidates.   Vocational Rehab Evaluation & Intervention:  Vocational Rehab - 07/04/23 1342       Initial Vocational Rehab Evaluation & Intervention   Assessment shows need for Vocational Rehabilitation  No             Education: Education Goals: Education classes will be provided on a variety of topics geared toward better understanding of heart health and risk factor modification. Participant will state understanding/return demonstration of topics presented as noted by education test scores.  Learning Barriers/Preferences:  Learning Barriers/Preferences - 07/04/23 1341       Learning Barriers/Preferences    Learning Barriers None    Learning Preferences Individual Instruction             General Cardiac Education Topics:  AED/CPR: - Group verbal and written instruction with the use of models to demonstrate the basic use of the AED with the basic ABC's of resuscitation.   Anatomy and Cardiac Procedures: - Group verbal and visual presentation and models provide information about basic cardiac anatomy and function. Reviews the testing methods done to diagnose heart disease and the outcomes of the test results. Describes the treatment choices: Medical Management, Angioplasty, or Coronary Bypass Surgery for treating various heart conditions including Myocardial Infarction, Angina, Valve Disease, and Cardiac Arrhythmias.  Written material given at graduation. Flowsheet Row Cardiac Rehab from 07/15/2023 in [facility] Cardiac and Pulmonary Rehab  Education need identified 07/15/23       Medication Safety: - Group verbal and visual instruction to review commonly prescribed medications for heart and lung disease. Reviews the medication, class of the drug, and side effects. Includes the steps to properly store meds and maintain the prescription regimen.  Written material given at graduation.   Intimacy: - Group verbal instruction through game format to discuss how heart and lung disease can affect sexual intimacy. Written material given at graduation..   Know Your Numbers and Heart Failure: - Group verbal and visual instruction to discuss disease risk factors for cardiac and pulmonary disease and treatment options.  Reviews associated critical values for Overweight/Obesity, Hypertension, Cholesterol, and Diabetes.  Discusses basics of heart failure: signs/symptoms and treatments.  Introduces Heart Failure Zone chart for action plan for heart failure.  Written material given at graduation.   Infection Prevention: - Provides verbal and written material to individual with discussion of infection  control including proper hand washing and proper equipment cleaning during exercise session. Flowsheet Row Cardiac Rehab from 07/15/2023 in [facility] Cardiac and Pulmonary Rehab  Date 07/15/23  Educator [facility]  Instruction Review Code 1- Verbalizes Understanding       Falls Prevention: - Provides verbal and written material to individual with discussion of falls prevention and safety. Flowsheet Row Cardiac Rehab from 07/15/2023 in [facility] Cardiac and Pulmonary Rehab  Date 07/15/23  Educator [facility]  Instruction Review Code 1- Verbalizes Understanding       Other: -Provides group and verbal instruction on various topics (see comments)   Knowledge Questionnaire Score:  Knowledge Questionnaire Score - 07/15/23 1527       Knowledge Questionnaire Score   Pre Score 22/26             Core Components/Risk Factors/Patient Goals at Admission:  Personal Goals and Risk Factors at Admission - 07/04/23 1340       Core Components/Risk Factors/Patient Goals on Admission   Heart Failure Yes    Intervention Provide a combined exercise and nutrition program that is supplemented with education, support and counseling about heart failure. Directed toward relieving symptoms such as shortness of breath, decreased exercise tolerance, and extremity edema.    Expected Outcomes Improve functional capacity of life;Short term: Attendance in program 2-3 days a week with  increased exercise capacity. Reported lower sodium intake. Reported increased fruit and vegetable intake. Reports medication compliance.;Long term: Adoption of self-care skills and reduction of barriers for early signs and symptoms recognition and intervention leading to self-care maintenance.;Short term: Daily weights obtained and reported for increase. Utilizing diuretic protocols set by physician.    Hypertension Yes    Intervention Provide education on lifestyle modifcations including regular physical activity/exercise, weight management, moderate  sodium restriction and increased consumption of fresh fruit, vegetables, and low fat dairy, alcohol moderation, and smoking cessation.;Monitor prescription use compliance.    Expected Outcomes Short Term: Continued assessment and intervention until BP is < 140/64mm HG in hypertensive participants. < 130/58mm HG in hypertensive participants with diabetes, heart failure or chronic kidney disease.;Long Term: Maintenance of blood pressure at goal levels.    Lipids Yes    Intervention Provide education and support for participant on nutrition & aerobic/resistive exercise along with prescribed medications to achieve LDL 70mg , HDL >40mg .    Expected Outcomes Short Term: Participant states understanding of desired cholesterol values and is compliant with medications prescribed. Participant is following exercise prescription and nutrition guidelines.;Long Term: Cholesterol controlled with medications as prescribed, with individualized exercise RX and with personalized nutrition plan. Value goals: LDL < 70mg , HDL > 40 mg.             Education:Diabetes - Individual verbal and written instruction to review signs/symptoms of diabetes, desired ranges of glucose level fasting, after meals and with exercise. Acknowledge that pre and post exercise glucose checks will be done for 3 sessions at entry of program.   Core Components/Risk Factors/Patient Goals Review:    Core Components/Risk Factors/Patient Goals at Discharge (Final Review):    ITP Comments:  ITP Comments     Row Name 07/04/23 1344 07/15/23 1549         ITP Comments Initial phone call completed. Diagnosis can be found in [facility] 7/13. EP Orientation scheduled for Monday 8/12 at 10:30. Completed and gym orientation. Initial ITP created and sent for review to Dr. Bethann Punches, Medical Director.               Comments: Initial ITP

## 2023-07-16 ENCOUNTER — Telehealth: Payer: Self-pay | Admitting: Cardiovascular Disease

## 2023-07-16 MED ORDER — CLOPIDOGREL BISULFATE 75 MG PO TABS: 75.0000 mg | ORAL_TABLET | Freq: Every day | ORAL | 0 refills | Status: DC

## 2023-07-16 NOTE — Telephone Encounter (Signed)
*  STAT* If patient is at the pharmacy, call can be transferred to refill team.   1. Which medications need to be refilled? (please list name of each medication and dose if known) clopidogrel (PLAVIX) 75 MG tablet      4. Which pharmacy/location (including street and city if local pharmacy) is medication to be sent to? [facility] - [place], [place] - [address]     5. Do they need a 30 day or 90 day supply? 90  Pt spouse states she spoke to pharmacy and they told her they don't have this medication

## 2023-07-16 NOTE — Telephone Encounter (Signed)
Requested Prescriptions   Signed Prescriptions Disp Refills  . clopidogrel (PLAVIX) 75 MG tablet 90 tablet 0    Sig: Take 1 tablet (75 mg total) by mouth daily.    Authorizing Provider: GOLLAN, TIMOTHY J    Ordering User: LOPEZ, MARINA C    

## 2023-07-17 ENCOUNTER — Encounter: Payer: Medicare Other | Admitting: *Deleted

## 2023-07-17 DIAGNOSIS — I252 Old myocardial infarction: Secondary | ICD-10-CM | POA: Diagnosis not present

## 2023-07-17 DIAGNOSIS — I214 Non-ST elevation (NSTEMI) myocardial infarction: Secondary | ICD-10-CM

## 2023-07-17 NOTE — Progress Notes (Signed)
Daily Session Note  Patient Details  Name: [name]. MRN: [id number] Date of Birth: 1933/10/30 Referring Provider:    Encounter Date: 07/17/2023  Check In:  Session Check In - 07/17/23 1428       Check-In   Supervising physician immediately available to respond to emergencies See telemetry face sheet for immediately available ER MD    Location [facility]    Staff Present Hulen Luster, BS, RRT, CPFT; Jewel Baize, RN Atilano Median, RN, ADN    Virtual Visit No    Medication changes reported     No    Fall or balance concerns reported    No    Warm-up and Cool-down Performed on first and last piece of equipment    Resistance Training Performed Yes    VAD Patient? No    PAD/SET Patient? No      Pain Assessment   Currently in Pain? No/denies                Social History   Tobacco Use  Smoking Status Never  Smokeless Tobacco Never    Goals Met:  Independence with exercise equipment Exercise tolerated well No report of concerns or symptoms today Strength training completed today  Goals Unmet:  Not Applicable  Comments: First full day of exercise!  Patient was oriented to gym and equipment including functions, settings, policies, and procedures.  Patient's individual exercise prescription and treatment plan were reviewed.  All starting workloads were established based on the results of the 6 minute walk test done at initial orientation visit.  The plan for exercise progression was also introduced and progression will be customized based on patient's performance and goals.    Dr. Bethann Punches is Medical Director for [facility] Cardiac Rehabilitation.  Dr. Vida Rigger is Medical Director for [facility] Pulmonary Rehabilitation.

## 2023-07-17 NOTE — Progress Notes (Signed)
Assessment start time: 1:31 PM  Questions/concerns: ensuring he eats enough protein and doesn't lose weight Accessibility to food: No concerns stated Digestive issues/concerns: no known food allergies, no chewing or swallowing issues, no known digestive issues   24-hours Recall: B: rasin bran, flax seed, berries L: [place] sandwich, ww bread, cheese, lettuce and tomato.  Snack: peanut butter and baked lays chips D: chicken, veggies, fruit  Beverages water (64), black coffee 1 cup daily Caffeine coffee  Supplements Vitamin D, B12, Vit C, Zinc,  Intake Patterns eats 3 meals per day, some times has a snack. Some days his appetite is lower than other days.  Education r/t nutrition plan Patient presents with wife. He is drinking ~64oz of water daily. Doesn't drink anything else other than a cup of black coffee in the morning. Spoke to them about setting goal to get at least 64oz consistently. Reviewed mediterranean diet handout, encouraged whole grains, healthy fats, and colorful produce. Spoke to them about nutrient and calorie density. Warned about filling up on low calorie veggies and not having enough room for protein and complex carbs. Wife took notes and asked questions, was able to teach back clearly. Set goal to eat a protein at every meal and to build more balanced meals. Provided some examples and brainstormed small meals and snacks with foods he likes and will eat with focus on ensuring he meets calorie and protein goals to prevent unintended weight loss.   Goal 1: Eat a protein at every meal Goal 2: Drink 64oz of water daily Goal 3: Pair protein with a carb  End time 2:05 PM

## 2023-07-18 ENCOUNTER — Encounter: Payer: Medicare Other | Admitting: *Deleted

## 2023-07-18 DIAGNOSIS — I252 Old myocardial infarction: Secondary | ICD-10-CM | POA: Diagnosis not present

## 2023-07-18 DIAGNOSIS — I214 Non-ST elevation (NSTEMI) myocardial infarction: Secondary | ICD-10-CM

## 2023-07-18 NOTE — Progress Notes (Signed)
Daily Session Note  Patient Details  Name: [name]. MRN: [id number] Date of Birth: [birth year] Referring Provider:    Encounter Date: 07/18/2023  Check In:  Session Check In - 07/18/23 1403       Check-In   Supervising physician immediately available to respond to emergencies See telemetry face sheet for immediately available ER MD    Location [facility]    Staff Present Rory Percy, MS, Exercise Physiologist;Maxon Conetta BS, , Exercise Physiologist; Katrinka Blazing, RN, ADN    Virtual Visit No    Medication changes reported     No    Fall or balance concerns reported    No    Warm-up and Cool-down Performed on first and last piece of equipment    Resistance Training Performed Yes    VAD Patient? No    PAD/SET Patient? No      Pain Assessment   Currently in Pain? No/denies                Social History   Tobacco Use  Smoking Status Never  Smokeless Tobacco Never    Goals Met:  Independence with exercise equipment Exercise tolerated well No report of concerns or symptoms today Strength training completed today  Goals Unmet:  Not Applicable  Comments: Pt able to follow exercise prescription today without complaint.  Will continue to monitor for progression.    Dr. Bethann Punches is Medical Director for [facility] Card[facility] Rehabilitation.  Dr. Vida Rigger is Medical Director for [facility] Pulmonary Rehabilitation.

## 2023-07-24 ENCOUNTER — Ambulatory Visit: Payer: Medicare Other

## 2023-07-25 ENCOUNTER — Encounter: Payer: Medicare Other | Admitting: *Deleted

## 2023-07-25 DIAGNOSIS — I252 Old myocardial infarction: Secondary | ICD-10-CM | POA: Diagnosis not present

## 2023-07-25 DIAGNOSIS — I214 Non-ST elevation (NSTEMI) myocardial infarction: Secondary | ICD-10-CM

## 2023-07-25 NOTE — Progress Notes (Signed)
Daily Session Note  Patient Details  Name: [name]. MRN: [id number] Date of Birth: Apr 16, 1933 Referring Provider:    Encounter Date: 07/25/2023  Check In:  Session Check In - 07/25/23 1425       Check-In   Supervising physician immediately available to respond to emergencies See telemetry face sheet for immediately available ER MD    Location [facility]    Staff Present Rory Percy, MS, Exercise Physiologist;Maxon Conetta BS, , Exercise Physiologist;Taejah Ohalloran Katrinka Blazing, RN, ADN    Virtual Visit No    Medication changes reported     No    Fall or balance concerns reported    No    Warm-up and Cool-down Performed on first and last piece of equipment    Resistance Training Performed Yes    VAD Patient? No    PAD/SET Patient? No      Pain Assessment   Currently in Pain? No/denies                Social History   Tobacco Use  Smoking Status Never  Smokeless Tobacco Never    Goals Met:  Independence with exercise equipment Exercise tolerated well No report of concerns or symptoms today Strength training completed today  Goals Unmet:  Not Applicable  Comments: Pt able to follow exercise prescription today without complaint.  Will continue to monitor for progression.    Dr. Bethann Punches is Medical Director for [facility] Cardiac Rehabilitation.  Dr. Vida Rigger is Medical Director for [facility] Pulmonary Rehabilitation.

## 2023-07-29 ENCOUNTER — Ambulatory Visit: Payer: Medicare Other

## 2023-07-29 DIAGNOSIS — I739 Peripheral vascular disease, unspecified: Secondary | ICD-10-CM

## 2023-07-31 ENCOUNTER — Encounter: Payer: Medicare Other | Admitting: *Deleted

## 2023-07-31 DIAGNOSIS — I252 Old myocardial infarction: Secondary | ICD-10-CM | POA: Diagnosis not present

## 2023-07-31 DIAGNOSIS — I214 Non-ST elevation (NSTEMI) myocardial infarction: Secondary | ICD-10-CM

## 2023-07-31 LAB — VAS US ABI WITH/WO TBI
Left ABI: 1.22
Right ABI: 1.21

## 2023-07-31 NOTE — Progress Notes (Signed)
Daily Session Note  Patient Details  Name: [name]. MRN: [id number] Date of Birth: 1933-04-15 Referring Provider:    Encounter Date: 07/31/2023  Check In:  Session Check In - 07/31/23 1416       Check-In   Supervising physician immediately available to respond to emergencies See telemetry face sheet for immediately available ER MD    Location [facility]    Staff Present Lanny Hurst, RN, ADN;Myrka Sylva Karleen Hampshire RN, BSN;Joseph Tibbie, RCP,RRT,BSRT    Virtual Visit No    Medication changes reported     No    Fall or balance concerns reported    No    Tobacco Cessation No Change    Warm-up and Cool-down Performed on first and last piece of equipment    Resistance Training Performed Yes    VAD Patient? No    PAD/SET Patient? No      Pain Assessment   Currently in Pain? No/denies                Social History   Tobacco Use  Smoking Status Never  Smokeless Tobacco Never    Goals Met:  Independence with exercise equipment Exercise tolerated well No report of concerns or symptoms today Strength training completed today  Goals Unmet:  Not Applicable  Comments: Pt able to follow exercise prescription today without complaint.  Will continue to monitor for progression.    Dr. Bethann Punches is Medical Director for [facility] Cardiac Rehabilitation.  Dr. Vida Rigger is Medical Director for [facility] Pulmonary Rehabilitation.

## 2023-08-01 ENCOUNTER — Encounter: Payer: Medicare Other | Admitting: *Deleted

## 2023-08-01 DIAGNOSIS — I214 Non-ST elevation (NSTEMI) myocardial infarction: Secondary | ICD-10-CM

## 2023-08-01 DIAGNOSIS — I252 Old myocardial infarction: Secondary | ICD-10-CM | POA: Diagnosis not present

## 2023-08-01 NOTE — Progress Notes (Signed)
Daily Session Note  Patient Details  Name: [name]. MRN: [id number] Date of Birth: 1933-01-13 Referring Provider:    Encounter Date: 08/01/2023  Check In:  Session Check In - 08/01/23 1406       Check-In   Supervising physician immediately available to respond to emergencies See telemetry face sheet for immediately available ER MD    Location [facility]    Staff Present Cyndia Diver, RN, BSN, MA;Maxon Conetta BS, , Exercise Physiologist;Joseph [place], [place]    Virtual Visit No    Medication changes reported     No    Fall or balance concerns reported    No    Tobacco Cessation No Change    Warm-up and Cool-down Performed on first and last piece of equipment    Resistance Training Performed Yes    VAD Patient? No    PAD/SET Patient? No      Pain Assessment   Currently in Pain? No/denies                Social History   Tobacco Use  Smoking Status Never  Smokeless Tobacco Never    Goals Met:  Independence with exercise equipment Exercise tolerated well No report of concerns or symptoms today  Goals Unmet:  Not Applicable  Comments: Pt able to follow exercise prescription today without complaint.  Will continue to monitor for progression.    Dr. Bethann Punches is Medical Director for [facility] Cardiac Rehabilitation.  Dr. Vida Rigger is Medical Director for [facility] Pulmonary Rehabilitation.

## 2023-08-02 LAB — BASIC METABOLIC PANEL: BUN: 22 mg/dL (ref 8–27)

## 2023-08-07 ENCOUNTER — Encounter: Payer: Self-pay | Admitting: *Deleted

## 2023-08-07 ENCOUNTER — Encounter: Payer: Medicare Other | Attending: Cardiology | Admitting: *Deleted

## 2023-08-07 DIAGNOSIS — I214 Non-ST elevation (NSTEMI) myocardial infarction: Secondary | ICD-10-CM | POA: Diagnosis present

## 2023-08-07 NOTE — Progress Notes (Signed)
Daily Session Note  Patient Details  Name: [name]. MRN: [id number] Date of Birth: March 22, 1933 Referring Provider:    Encounter Date: 08/07/2023  Check In:  Session Check In - 08/07/23 1409       Check-In   Supervising physician immediately available to respond to emergencies See telemetry face sheet for immediately available ER MD    Location [facility]    Staff Present Susann Givens, RN Atilano Median, RN, Franki Monte, BS, ACSM CEP, Exercise Physiologist    Virtual Visit No    Medication changes reported     No    Warm-up and Cool-down Performed on first and last piece of equipment    Resistance Training Performed Yes    VAD Patient? No    PAD/SET Patient? No      Pain Assessment   Currently in Pain? No/denies                Social History   Tobacco Use  Smoking Status Never  Smokeless Tobacco Never    Goals Met:  Independence with exercise equipment Exercise tolerated well No report of concerns or symptoms today Strength training completed today  Goals Unmet:  Not Applicable  Comments: Pt able to follow exercise prescription today without complaint.  Will continue to monitor for progression.    Dr. Bethann Punches is Medical Director for [facility] Cardiac Rehabilitation.  Dr. Vida Rigger is Medical Director for [facility] Pulmonary Rehabilitation.

## 2023-08-07 NOTE — Progress Notes (Signed)
Cardiac Individual Treatment Plan  Patient Details  Name: [name]. MRN: [id number] Date of Birth: 24-Mar-1933 Referring Provider:    Initial Encounter Date:   Visit Diagnosis: NSTEMI (non-ST elevated myocardial infarction) (HCC)  Patient's Home Medications on Admission:  Current Outpatient Medications:    acetaminophen (TYLENOL) 650 MG CR tablet, Take 650 mg by mouth every 8 (eight) hours as needed. , Disp: , Rfl:    apixaban (ELIQUIS) 2.5 MG TABS tablet, Take 1 tablet (2.5 mg total) by mouth 2 (two) times daily., Disp: 180 tablet, Rfl: 3   Ascorbic Acid (VITAMIN C) 1000 MG tablet, Take 1,000 mg by mouth daily., Disp: , Rfl:    atorvastatin (LIPITOR) 40 MG tablet, Take 1 tablet (40 mg total) by mouth daily., Disp: 90 tablet, Rfl: 3   b complex vitamins tablet, Take 1 tablet by mouth daily., Disp: , Rfl:    cholecalciferol (VITAMIN D) 1000 units tablet, Take 2,000 Units by mouth daily., Disp: , Rfl:    clopidogrel (PLAVIX) 75 MG tablet, Take 1 tablet (75 mg total) by mouth daily., Disp: 90 tablet, Rfl: 0   cyanocobalamin (VITAMIN B12) 1000 MCG tablet, Take 1,000 mcg by mouth daily., Disp: , Rfl:    docusate sodium (COLACE) 100 MG capsule, Take 100 mg by mouth 2 (two) times daily., Disp: , Rfl:    ezetimibe (ZETIA) 10 MG tablet, Take 1 tablet (10 mg total) by mouth daily., Disp: 90 tablet, Rfl: 3   FLAXSEED, LINSEED, PO, Take 2 g by mouth daily., Disp: , Rfl:    furosemide (LASIX) 20 MG tablet, Take 1 tablet (20 mg total) by mouth daily., Disp: 90 tablet, Rfl: 3   isosorbide mononitrate (IMDUR) 30 MG 24 hr tablet, Take 0.5 tablets (15 mg total) by mouth daily., Disp: 90 tablet, Rfl: 3   metoprolol succinate (TOPROL-XL) 25 MG 24 hr tablet, Take 1 tablet (25 mg total) by mouth daily., Disp: 30 tablet, Rfl: 11   nitroGLYCERIN (NITROSTAT) 0.4 MG SL tablet, Place 1 tablet (0.4 mg total) under the tongue every 5 (five) minutes x 3 doses as needed for chest pain., Disp: 25 tablet,  Rfl: 0   pantoprazole (PROTONIX) 40 MG tablet, Take 1 tablet (40 mg total) by mouth 2 (two) times daily., Disp: 60 tablet, Rfl: 1   potassium chloride (KLOR-CON) 10 MEQ tablet, Take 1 tablet (10 mEq total) by mouth daily., Disp: 90 tablet, Rfl: 3   pregabalin (LYRICA) 100 MG capsule, Take 100 mg by mouth in the morning., Disp: , Rfl:    pregabalin (LYRICA) 150 MG capsule, Take 150 mg by mouth at bedtime., Disp: , Rfl:    Probiotic Product (UP4 PROBIOTICS PO), Take 7.5 mg by mouth., Disp: , Rfl:    ranolazine (RANEXA) 500 MG 12 hr tablet, Take 1 tablet (500 mg total) by mouth 2 (two) times daily., Disp: 180 tablet, Rfl: 3   tamsulosin (FLOMAX) 0.4 MG CAPS capsule, Take 2 capsules (0.8 mg total) by mouth daily., Disp: 30 capsule, Rfl: 0   zinc sulfate 220 (50 Zn) MG capsule, Take 1 tablet by mouth daily., Disp: , Rfl:   Past Medical History: Past Medical History:  Diagnosis Date   Acute embolic stroke (HCC) 02/01/2017   BPH (benign prostatic hyperplasia)    Bradycardia 11/10/2015   Coronary artery disease    Dizziness 08/21/2012   DYSPEPSIA 07/31/2010   Qualifier: Diagnosis of  By: Janee Morn RN, Morrie Sheldon     Hyperlipidemia 05/16/2010   Qualifier: Diagnosis of  ByMariah Milling MD, [name]      Hypertension    HYPERTENSION, BENIGN 05/16/2010   Qualifier: Diagnosis of  By: Mariah Milling MD, [name]     Stenosis of right carotid artery 02/01/2017    Tobacco Use: Social History   Tobacco Use  Smoking Status Never  Smokeless Tobacco Never    Labs: Review Flowsheet       Latest Ref Rng & Units 01/23/2010 06/15/2023 06/16/2023  Labs for ITP Cardiac and Pulmonary Rehab  Cholestrol 0 - 200 mg/dL 161  - 90   LDL (calc) 0 - 99 mg/dL 09.6  - 44   HDL-C >04 mg/dL 54.0  - 40   Trlycerides <150 mg/dL 93  - 30   Hemoglobin J8J 4.8 - 5.6 % 5.4  6.3  6.3  -    Details       Multiple values from one day are sorted in reverse-chronological order          Exercise Target Goals: Exercise Program Goal: Individual  exercise prescription set using results from initial 6 min walk test and THRR while considering  patient's activity barriers and safety.   Exercise Prescription Goal: Initial exercise prescription builds to 30-45 minutes a day of aerobic activity, 2-3 days per week.  Home exercise guidelines will be given to patient during program as part of exercise prescription that the participant will acknowledge.   Education: Aerobic Exercise: - Group verbal and visual presentation on the components of exercise prescription. Introduces F.I.T.T principle from ACSM for exercise prescriptions.  Reviews F.I.T.T. principles of aerobic exercise including progression. Written material given at graduation. Flowsheet Row Cardiac Rehab from 07/15/2023 in [facility] Cardiac and Pulmonary Rehab  Education need identified 07/15/23       Education: Resistance Exercise: - Group verbal and visual presentation on the components of exercise prescription. Introduces F.I.T.T principle from ACSM for exercise prescriptions  Reviews F.I.T.T. principles of resistance exercise including progression. Written material given at graduation.    Education: Exercise & Equipment Safety: - Individual verbal instruction and demonstration of equipment use and safety with use of the equipment. Flowsheet Row Cardiac Rehab from 07/15/2023 in [facility] Cardiac and Pulmonary Rehab  Date 07/15/23  Educator [facility]  Instruction Review Code 1- Verbalizes Understanding       Education: Exercise Physiology & General Exercise Guidelines: - Group verbal and written instruction with models to review the exercise physiology of the cardiovascular system and associated critical values. Provides general exercise guidelines with specific guidelines to those with heart or lung disease.    Education: Flexibility, Balance, Mind/Body Relaxation: - Group verbal and visual presentation with interactive activity on the components of exercise prescription. Introduces  F.I.T.T principle from ACSM for exercise prescriptions. Reviews F.I.T.T. principles of flexibility and balance exercise training including progression. Also discusses the mind body connection.  Reviews various relaxation techniques to help reduce and manage stress (i.e. Deep breathing, progressive muscle relaxation, and visualization). Balance handout provided to take home. Written material given at graduation.   Activity Barriers & Risk Stratification:  Activity Barriers & Cardiac Risk Stratification - 07/15/23 1443       Activity Barriers & Cardiac Risk Stratification   Activity Barriers Arthritis;Back Problems;Balance Concerns;History of Falls;Muscular Weakness;Other (comment)    Comments Tremors in hands, neuropathy in feet    Cardiac Risk Stratification High             6 Minute Walk:  6 Minute Walk     Row Name 07/15/23 1438  6 Minute Walk   Phase Initial     Distance 780 feet     Walk Time 6 minutes     # of Rest Breaks 0     MPH 1.5     METS 1.03     RPE 13     Perceived Dyspnea  1     VO2 Peak 3.6     Symptoms No     Resting HR 83 bpm     Resting BP 100/56     Resting Oxygen Saturation  97 %     Exercise Oxygen Saturation  during 6 min walk 100 %     Max Ex. HR 93 bpm     Max Ex. BP 120/60     2 Minute Post BP 120/62              Oxygen Initial Assessment:   Oxygen Re-Evaluation:   Oxygen Discharge (Final Oxygen Re-Evaluation):   Initial Exercise Prescription:  Initial Exercise Prescription - 07/15/23 1500       Recumbant Bike   RPM 50    Watts 15    Minutes 15    METs 1.03      NuStep   Level 1    SPM 80    Minutes 15    METs 1.03      Track   Laps 10    Minutes 15    METs 1.54      Prescription Details   Frequency (times per week) 3    Duration Progress to 30 minutes of continuous aerobic without signs/symptoms of physical distress      Intensity   THRR 40-80% of Max Heartrate 103-123    Ratings of Perceived  Exertion 11-13    Perceived Dyspnea 0-4      Progression   Progression Continue to progress workloads to maintain intensity without signs/symptoms of physical distress.      Resistance Training   Training Prescription Yes    Weight 3lb    Reps 10-15             Perform Capillary Blood Glucose checks as needed.  Exercise Prescription Changes:   Exercise Prescription Changes     Row Name 07/15/23 1500 07/23/23 1100           Response to Exercise   Blood Pressure (Admit) 100/56 122/68      Blood Pressure (Exercise) 120/60 128/70      Blood Pressure (Exit) 120/62 122/52      Heart Rate (Admit) 83 bpm 82 bpm      Heart Rate (Exercise) 93 bpm 101 bpm      Heart Rate (Exit) 71 bpm 84 bpm      Oxygen Saturation (Admit) 97 % --      Oxygen Saturation (Exercise) 100 % --      Rating of Perceived Exertion (Exercise) 13 13      Perceived Dyspnea (Exercise) 1 --      Symptoms None --      Comments results --      Duration -- Continue with 30 min of aerobic exercise without signs/symptoms of physical distress.      Intensity -- THRR unchanged        Progression   Progression -- Continue to progress workloads to maintain intensity without signs/symptoms of physical distress.      Average METs -- 1.93        Resistance Training   Training Prescription -- Yes  Weight -- 3lb      Reps -- 10-15        NuStep   Level -- 1      Minutes -- 30      METs -- 1.5        T5 Nustep   Level -- 1      SPM -- 80      Minutes -- 30      METs -- 2.3               Exercise Comments:   Exercise Comments     Row Name 07/17/23 1430           Exercise Comments First full day of exercise!  Patient was oriented to gym and equipment including functions, settings, policies, and procedures.  Patient's individual exercise prescription and treatment plan were reviewed.  All starting workloads were established based on the results of the 6 minute walk test done at initial  orientation visit.  The plan for exercise progression was also introduced and progression will be customized based on patient's performance and goals.                Exercise Goals and Review:   Exercise Goals     Row Name 07/15/23 1515             Exercise Goals   Increase Physical Activity Yes       Expected Outcomes Short Term: Attend rehab on a regular basis to increase amount of physical activity.;Long Term: Add in home exercise to make exercise part of routine and to increase amount of physical activity.;Long Term: Exercising regularly at least 3-5 days a week.       Increase Strength and Stamina Yes       Expected Outcomes Short Term: Increase workloads from initial exercise prescription for resistance, speed, and METs.;Short Term: Perform resistance training exercises routinely during rehab and add in resistance training at home;Long Term: Improve cardiorespiratory fitness, muscular endurance and strength as measured by increased METs and functional capacity ( )       Able to understand and use rate of perceived exertion (RPE) scale Yes       Intervention Provide education and explanation on how to use RPE scale       Expected Outcomes Short Term: Able to use RPE daily in rehab to express subjective intensity level;Long Term:  Able to use RPE to guide intensity level when exercising independently       Able to understand and use Dyspnea scale Yes       Intervention Provide education and explanation on how to use Dyspnea scale       Expected Outcomes Short Term: Able to use Dyspnea scale daily in rehab to express subjective sense of shortness of breath during exertion;Long Term: Able to use Dyspnea scale to guide intensity level when exercising independently       Knowledge and understanding of Target Heart Rate Range (THRR) Yes       Intervention Provide education and explanation of THRR including how the numbers were predicted and where they are located for reference        Expected Outcomes Short Term: Able to state/look up THRR;Short Term: Able to use daily as guideline for intensity in rehab;Long Term: Able to use THRR to govern intensity when exercising independently       Able to check pulse independently Yes       Intervention Provide education and demonstration on how  to check pulse in carotid and radial arteries.;Review the importance of being able to check your own pulse for safety during independent exercise       Expected Outcomes Short Term: Able to explain why pulse checking is important during independent exercise;Long Term: Able to check pulse independently and accurately       Understanding of Exercise Prescription Yes       Intervention Provide education, explanation, and written materials on patient's individual exercise prescription       Expected Outcomes Short Term: Able to explain program exercise prescription;Long Term: Able to explain home exercise prescription to exercise independently                Exercise Goals Re-Evaluation :  Exercise Goals Re-Evaluation     Row Name 07/17/23 1430 07/23/23 1156           Exercise Goal Re-Evaluation   Exercise Goals Review Increase Physical Activity;Able to understand and use rate of perceived exertion (RPE) scale;Knowledge and understanding of Target Heart Rate Range (THRR);Understanding of Exercise Prescription;Increase Strength and Stamina;Able to check pulse independently Increase Physical Activity;Understanding of Exercise Prescription;Increase Strength and Stamina      Comments Reviewed RPE  and dyspnea scale, THR and program prescription with pt today.  Pt voiced understanding and was given a copy of goals to take home. Remon is off to a good start in the program. He has done the nustep for the entire duration of the aerobic session. We will continue to monitor his progress in the program.      Expected Outcomes Short: Use RPE daily to regulate intensity.  Long: Follow program prescription  in THR. Short: Continue to follow current exercise prescription. Try a nustep machine and a another machine for the aerobic portion of rehab. Long: Continue exercise to improve strength and stamina               Discharge Exercise Prescription (Final Exercise Prescription Changes):  Exercise Prescription Changes - 07/23/23 1100       Response to Exercise   Blood Pressure (Admit) 122/68    Blood Pressure (Exercise) 128/70    Blood Pressure (Exit) 122/52    Heart Rate (Admit) 82 bpm    Heart Rate (Exercise) 101 bpm    Heart Rate (Exit) 84 bpm    Rating of Perceived Exertion (Exercise) 13    Duration Continue with 30 min of aerobic exercise without signs/symptoms of physical distress.    Intensity THRR unchanged      Progression   Progression Continue to progress workloads to maintain intensity without signs/symptoms of physical distress.    Average METs 1.93      Resistance Training   Training Prescription Yes    Weight 3lb    Reps 10-15      NuStep   Level 1    Minutes 30    METs 1.5      T5 Nustep   Level 1    SPM 80    Minutes 30    METs 2.3             Nutrition:  Target Goals: Understanding of nutrition guidelines, daily intake of sodium 1500mg , cholesterol 200mg , calories 30% from fat and 7% or less from saturated fats, daily to have 5 or more servings of fruits and vegetables.  Education: All About Nutrition: -Group instruction provided by verbal, written material, interactive activities, discussions, models, and posters to present general guidelines for heart healthy nutrition including fat, fiber,  MyPlate, the role of sodium in heart healthy nutrition, utilization of the nutrition label, and utilization of this knowledge for meal planning. Follow up email sent as well. Written material given at graduation. Flowsheet Row Cardiac Rehab from 07/15/2023 in [facility] Cardiac and Pulmonary Rehab  Education need identified 07/15/23       Biometrics:  Pre  Biometrics - 07/15/23 1517       Pre Biometrics   Height 5' 8.6" (1.742 m)    Weight 174 lb (78.9 kg)    Waist Circumference 43.5 inches    Hip Circumference 39.2 inches    Waist to Hip Ratio 1.11 %    BMI (Calculated) 26.01    Single Leg Stand 0 seconds              Nutrition Therapy Plan and Nutrition Goals:  Nutrition Therapy & Goals - 07/17/23 1426       Nutrition Therapy   Diet Cardiac    Protein (specify units) >75g    Fiber 30 grams    Whole Grain Foods 3 servings    Saturated Fats 15 max. grams    Fruits and Vegetables 5 servings/day    Sodium 2 grams      Personal Nutrition Goals   Nutrition Goal Eat a protein at every meal    Personal Goal #2 Drink 64oz of water daily    Personal Goal #3 Pair protein with a carb    Comments Patient presents with wife. He is drinking ~64oz of water daily. Doesn't drink anything else other than a cup of black coffee in the morning. Spoke to them about setting goal to get at least 64oz consistently. Reviewed mediterranean diet handout, encouraged whole grains, healthy fats, and colorful produce. Spoke to them about nutrient and calorie density. Warned about filling up on low calorie veggies and not having enough room for protein and complex carbs. Wife took notes and asked questions, was able to teach back clearly. Set goal to eat a protein at every meal and to build more balanced meals. Provided some examples and brainstormed small meals and snacks with foods he likes and will eat with focus on ensuring he meets calorie and protein goals to prevent unintended weight loss.      Intervention Plan   Intervention Prescribe, educate and counsel regarding individualized specific dietary modifications aiming towards targeted core components such as weight, hypertension, lipid management, diabetes, heart failure and other comorbidities.;Nutrition handout(s) given to patient.    Expected Outcomes Short Term Goal: Understand basic principles of  dietary content, such as calories, fat, sodium, cholesterol and nutrients.;Short Term Goal: A plan has been developed with personal nutrition goals set during dietitian appointment.;Long Term Goal: Adherence to prescribed nutrition plan.             Nutrition Assessments:  MEDIFICTS Score Key: ?70 Need to make dietary changes  40-70 Heart Healthy Diet ? 40 Therapeutic Level Cholesterol Diet  Flowsheet Row Cardiac Rehab from 07/15/2023 in [facility] Cardiac and Pulmonary Rehab  Picture Your Plate Total Score on Admission 77      Picture Your Plate Scores: <54 Unhealthy dietary pattern with much room for improvement. 41-50 Dietary pattern unlikely to meet recommendations for good health and room for improvement. 51-60 More healthful dietary pattern, with some room for improvement.  >60 Healthy dietary pattern, although there may be some specific behaviors that could be improved.    Nutrition Goals Re-Evaluation:   Nutrition Goals Discharge (Final Nutrition Goals Re-Evaluation):  Psychosocial: Target Goals: Acknowledge presence or absence of significant depression and/or stress, maximize coping skills, provide positive support system. Participant is able to verbalize types and ability to use techniques and skills needed for reducing stress and depression.   Education: Stress, Anxiety, and Depression - Group verbal and visual presentation to define topics covered.  Reviews how body is impacted by stress, anxiety, and depression.  Also discusses healthy ways to reduce stress and to treat/manage anxiety and depression.  Written material given at graduation. Flowsheet Row Cardiac Rehab from 07/15/2023 in [facility] Cardiac and Pulmonary Rehab  Education need identified 07/15/23       Education: Sleep Hygiene -Provides group verbal and written instruction about how sleep can affect your health.  Define sleep hygiene, discuss sleep cycles and impact of sleep habits. Review good sleep hygiene  tips.    Initial Review & Psychosocial Screening:  Initial Psych Review & Screening - 07/04/23 1342       Initial Review   Current issues with Current Stress Concerns    Source of Stress Concerns Unable to participate in former interests or hobbies      Family Dynamics   Good Support System? Yes      Barriers   Psychosocial barriers to participate in program There are no identifiable barriers or psychosocial needs.;The patient should benefit from training in stress management and relaxation.      Screening Interventions   Interventions Encouraged to exercise;Provide feedback about the scores to participant;To provide support and resources with identified psychosocial needs    Expected Outcomes Short Term goal: Utilizing psychosocial counselor, staff and physician to assist with identification of specific Stressors or current issues interfering with healing process. Setting desired goal for each stressor or current issue identified.;Long Term Goal: Stressors or current issues are controlled or eliminated.;Short Term goal: Identification and review with participant of any Quality of Life or Depression concerns found by scoring the questionnaire.;Long Term goal: The participant improves quality of Life and PHQ9 Scores as seen by post scores and/or verbalization of changes             Quality of Life Scores:   Quality of Life - 07/15/23 1526       Quality of Life   Select Quality of Life      Quality of Life Scores   Health/Function Pre 20.14 %    Socioeconomic Pre 30 %    Psych/Spiritual Pre 30 %    Family Pre 30 %    GLOBAL Pre 25.69 %            Scores of 19 and below usually indicate a poorer quality of life in these areas.  A difference of  2-3 points is a clinically meaningful difference.  A difference of 2-3 points in the total score of the Quality of Life Index has been associated with significant improvement in overall quality of life, self-image, physical symptoms,  and general health in studies assessing change in quality of life.  PHQ-9: Review Flowsheet       07/15/2023  Depression screen PHQ 2/9  Decreased Interest 0  Down, Depressed, Hopeless 0  PHQ - 2 Score 0  Altered sleeping 0  Tired, decreased energy 2  Change in appetite 1  Feeling bad or failure about yourself  0  Trouble concentrating 3  Moving slowly or fidgety/restless 0  Suicidal thoughts 0  PHQ-9 Score 6  Difficult doing work/chores Somewhat difficult    Details  Interpretation of Total Score  Total Score Depression Severity:  1-4 = Minimal depression, 5-9 = Mild depression, 10-14 = Moderate depression, 15-19 = Moderately severe depression, 20-27 = Severe depression   Psychosocial Evaluation and Intervention:  Psychosocial Evaluation - 07/04/23 1344       Psychosocial Evaluation & Interventions   Interventions Encouraged to exercise with the program and follow exercise prescription    Comments Mr. [place] is coming to cardiac rehab after a NSTEMI. He had a stroke 5 years ago that caused a difficulty in word processing, so his wife mainly spoke on the phone during the orientation phone call. His main motivation for coming to the program is to get back to what he had been doing. He has felt weaker since his MI, so he wants to regain his strength back. His wife and he are worried about him falling because of his weakness, so he is ready to work on his stamina as well. He has a good support system. He reports no sleep concerns or concerns with anxiety or depression.    Expected Outcomes Short: attend cardiac rehab for education and exercise. Long: devleop and maintain positive self care habits    Continue Psychosocial Services  Follow up required by staff             Psychosocial Re-Evaluation:   Psychosocial Discharge (Final Psychosocial Re-Evaluation):   Vocational Rehabilitation: Provide vocational rehab assistance to qualifying candidates.    Vocational Rehab Evaluation & Intervention:  Vocational Rehab - 07/04/23 1342       Initial Vocational Rehab Evaluation & Intervention   Assessment shows need for Vocational Rehabilitation No             Education: Education Goals: Education classes will be provided on a variety of topics geared toward better understanding of heart health and risk factor modification. Participant will state understanding/return demonstration of topics presented as noted by education test scores.  Learning Barriers/Preferences:  Learning Barriers/Preferences - 07/04/23 1341       Learning Barriers/Preferences   Learning Barriers None    Learning Preferences Individual Instruction             General Cardiac Education Topics:  AED/CPR: - Group verbal and written instruction with the use of models to demonstrate the basic use of the AED with the basic ABC's of resuscitation.   Anatomy and Cardiac Procedures: - Group verbal and visual presentation and models provide information about basic cardiac anatomy and function. Reviews the testing methods done to diagnose heart disease and the outcomes of the test results. Describes the treatment choices: Medical Management, Angioplasty, or Coronary Bypass Surgery for treating various heart conditions including Myocardial Infarction, Angina, Valve Disease, and Cardiac Arrhythmias.  Written material given at graduation. Flowsheet Row Cardiac Rehab from 07/15/2023 in [facility] Cardiac and Pulmonary Rehab  Education need identified 07/15/23       Medication Safety: - Group verbal and visual instruction to review commonly prescribed medications for heart and lung disease. Reviews the medication, class of the drug, and side effects. Includes the steps to properly store meds and maintain the prescription regimen.  Written material given at graduation.   Intimacy: - Group verbal instruction through game format to discuss how heart and lung disease can affect  sexual intimacy. Written material given at graduation..   Know Your Numbers and Heart Failure: - Group verbal and visual instruction to discuss disease risk factors for cardiac and pulmonary disease and treatment options.  Reviews associated critical values  for Overweight/Obesity, Hypertension, Cholesterol, and Diabetes.  Discusses basics of heart failure: signs/symptoms and treatments.  Introduces Heart Failure Zone chart for action plan for heart failure.  Written material given at graduation.   Infection Prevention: - Provides verbal and written material to individual with discussion of infection control including proper hand washing and proper equipment cleaning during exercise session. Flowsheet Row Cardiac Rehab from 07/15/2023 in [facility] Cardiac and Pulmonary Rehab  Date 07/15/23  Educator [facility]  Instruction Review Code 1- Verbalizes Understanding       Falls Prevention: - Provides verbal and written material to individual with discussion of falls prevention and safety. Flowsheet Row Cardiac Rehab from 07/15/2023 in [facility] Cardiac and Pulmonary Rehab  Date 07/15/23  Educator [facility]  Instruction Review Code 1- Verbalizes Understanding       Other: -Provides group and verbal instruction on various topics (see comments)   Knowledge Questionnaire Score:  Knowledge Questionnaire Score - 07/15/23 1527       Knowledge Questionnaire Score   Pre Score 22/26             Core Components/Risk Factors/Patient Goals at Admission:  Personal Goals and Risk Factors at Admission - 07/04/23 1340       Core Components/Risk Factors/Patient Goals on Admission   Heart Failure Yes    Intervention Provide a combined exercise and nutrition program that is supplemented with education, support and counseling about heart failure. Directed toward relieving symptoms such as shortness of breath, decreased exercise tolerance, and extremity edema.    Expected Outcomes Improve functional capacity of  life;Short term: Attendance in program 2-3 days a week with increased exercise capacity. Reported lower sodium intake. Reported increased fruit and vegetable intake. Reports medication compliance.;Long term: Adoption of self-care skills and reduction of barriers for early signs and symptoms recognition and intervention leading to self-care maintenance.;Short term: Daily weights obtained and reported for increase. Utilizing diuretic protocols set by physician.    Hypertension Yes    Intervention Provide education on lifestyle modifcations including regular physical activity/exercise, weight management, moderate sodium restriction and increased consumption of fresh fruit, vegetables, and low fat dairy, alcohol moderation, and smoking cessation.;Monitor prescription use compliance.    Expected Outcomes Short Term: Continued assessment and intervention until BP is < 140/45mm HG in hypertensive participants. < 130/13mm HG in hypertensive participants with diabetes, heart failure or chronic kidney disease.;Long Term: Maintenance of blood pressure at goal levels.    Lipids Yes    Intervention Provide education and support for participant on nutrition & aerobic/resistive exercise along with prescribed medications to achieve LDL 70mg , HDL >40mg .    Expected Outcomes Short Term: Participant states understanding of desired cholesterol values and is compliant with medications prescribed. Participant is following exercise prescription and nutrition guidelines.;Long Term: Cholesterol controlled with medications as prescribed, with individualized exercise RX and with personalized nutrition plan. Value goals: LDL < 70mg , HDL > 40 mg.             Education:Diabetes - Individual verbal and written instruction to review signs/symptoms of diabetes, desired ranges of glucose level fasting, after meals and with exercise. Acknowledge that pre and post exercise glucose checks will be done for 3 sessions at entry of  program.   Core Components/Risk Factors/Patient Goals Review:    Core Components/Risk Factors/Patient Goals at Discharge (Final Review):    ITP Comments:  ITP Comments     Row Name 07/04/23 1344 07/15/23 1549 07/17/23 1429 08/07/23 0923     ITP Comments Initial phone  call completed. Diagnosis can be found in [facility] 7/13. EP Orientation scheduled for Monday 8/12 at 10:30. Completed and gym orientation. Initial ITP created and sent for review to Dr. Bethann Punches, Medical Director. First full day of exercise!  Patient was oriented to gym and equipment including functions, settings, policies, and procedures.  Patient's individual exercise prescription and treatment plan were reviewed.  All starting workloads were established based on the results of the 6 minute walk test done at initial orientation visit.  The plan for exercise progression was also introduced and progression will be customized based on patient's performance and goals. 30 Day review completed. Medical Director ITP review done, changes made as directed, and signed approval by Medical Director.     new to program             Comments:

## 2023-08-08 ENCOUNTER — Encounter: Payer: Medicare Other | Admitting: *Deleted

## 2023-08-08 DIAGNOSIS — I214 Non-ST elevation (NSTEMI) myocardial infarction: Secondary | ICD-10-CM | POA: Diagnosis not present

## 2023-08-08 NOTE — Progress Notes (Signed)
Daily Session Note  Patient Details  Name: [name]. MRN: [id number] Date of Birth: [birth year] Referring Provider:    Encounter Date: 08/08/2023  Check In:  Session Check In - 08/08/23 1403       Check-In   Supervising physician immediately available to respond to emergencies See telemetry face sheet for immediately available ER MD    Location [facility]-Cardiac & Pulmonary Rehab    Staff Present Elige Ko, RCP,RRT,BSRT;Maxon Conetta BS, , Exercise Physiologist;Kamariyah Timberlake Katrinka Blazing, RN, ADN    Virtual Visit No    Medication changes reported     No    Fall or balance concerns reported    No    Warm-up and Cool-down Performed on first and last piece of equipment    Resistance Training Performed Yes    VAD Patient? No    PAD/SET Patient? No      Pain Assessment   Currently in Pain? No/denies                Social History   Tobacco Use  Smoking Status Never  Smokeless Tobacco Never    Goals Met:  Independence with exercise equipment Exercise tolerated well No report of concerns or symptoms today Strength training completed today  Goals Unmet:  Not Applicable  Comments: Pt able to follow exercise prescription today without complaint.  Will continue to monitor for progression.    Dr. Bethann Punches is Medical Director for [facility] Cardiac Rehabilitation.  Dr. Vida Rigger is Medical Director for [facility] Pulmonary Rehabilitation.

## 2023-08-12 ENCOUNTER — Encounter: Payer: Medicare Other | Admitting: *Deleted

## 2023-08-12 DIAGNOSIS — I214 Non-ST elevation (NSTEMI) myocardial infarction: Secondary | ICD-10-CM | POA: Diagnosis not present

## 2023-08-12 NOTE — Progress Notes (Signed)
Daily Session Note  Patient Details  Name: [name]. MRN: [id number] Date of Birth: [birth year] Referring Provider:    Encounter Date: 08/12/2023  Check In:  Session Check In - 08/12/23 1434       Check-In   Supervising physician immediately available to respond to emergencies See telemetry face sheet for immediately available ER MD    Location [facility]-Cardiac & Pulmonary Rehab    Staff Present Cora Collum, RN, BSN, CCRP;Maxon Conetta BS, , Exercise Physiologist;Joseph [place], [place]    Virtual Visit No    Medication changes reported     No    Fall or balance concerns reported    No    Warm-up and Cool-down Performed on first and last piece of equipment    Resistance Training Performed Yes    VAD Patient? No    PAD/SET Patient? No      Pain Assessment   Currently in Pain? No/denies                Social History   Tobacco Use  Smoking Status Never  Smokeless Tobacco Never    Goals Met:  Independence with exercise equipment Exercise tolerated well No report of concerns or symptoms today  Goals Unmet:  Not Applicable  Comments: Pt able to follow exercise prescription today without complaint.  Will continue to monitor for progression.    Dr. Bethann Punches is Medical Director for [facility] Cardiac Rehabilitation.  Dr. Vida Rigger is Medical Director for [facility] Pulmonary Rehabilitation.

## 2023-08-14 ENCOUNTER — Encounter: Payer: Medicare Other | Admitting: *Deleted

## 2023-08-14 DIAGNOSIS — I214 Non-ST elevation (NSTEMI) myocardial infarction: Secondary | ICD-10-CM | POA: Diagnosis not present

## 2023-08-14 NOTE — Progress Notes (Signed)
Daily Session Note  Patient Details  Name: [name]. MRN: [id number] Date of Birth: [birth year] Referring Provider:    Encounter Date: 08/14/2023  Check In:  Session Check In - 08/14/23 1403       Check-In   Supervising physician immediately available to respond to emergencies See telemetry face sheet for immediately available ER MD    Location [facility]-Cardiac & Pulmonary Rehab    Staff Present Ronette Deter, BS, Exercise Physiologist;Maxon Conetta BS, , Exercise Physiologist;Antonya Leeder Katrinka Blazing, RN, ADN    Virtual Visit No    Medication changes reported     No    Fall or balance concerns reported    No    Warm-up and Cool-down Performed on first and last piece of equipment    Resistance Training Performed Yes    VAD Patient? No    PAD/SET Patient? No      Pain Assessment   Currently in Pain? No/denies                Social History   Tobacco Use  Smoking Status Never  Smokeless Tobacco Never    Goals Met:  Independence with exercise equipment Exercise tolerated well No report of concerns or symptoms today Strength training completed today  Goals Unmet:  Not Applicable  Comments: Pt able to follow exercise prescription today without complaint.  Will continue to monitor for progression.    Dr. Bethann Punches is Medical Director for [facility] Cardiac Rehabilitation.  Dr. Vida Rigger is Medical Director for [facility] Pulmonary Rehabilitation.

## 2023-08-15 ENCOUNTER — Encounter: Payer: Medicare Other | Admitting: *Deleted

## 2023-08-15 DIAGNOSIS — I214 Non-ST elevation (NSTEMI) myocardial infarction: Secondary | ICD-10-CM

## 2023-08-15 NOTE — Progress Notes (Signed)
Daily Session Note  Patient Details  Name: [name]. MRN: [id number] Date of Birth: [birth year] Referring Provider:    Encounter Date: 08/15/2023  Check In:  Session Check In - 08/15/23 1404       Check-In   Supervising physician immediately available to respond to emergencies See telemetry face sheet for immediately available ER MD    Location [facility]-Cardiac & Pulmonary Rehab    Staff Present Elige Ko, RCP,RRT,BSRT;Maxon Conetta BS, , Exercise Physiologist;Dawne Casali Katrinka Blazing, RN, ADN    Virtual Visit No    Medication changes reported     No    Fall or balance concerns reported    No    Warm-up and Cool-down Performed on first and last piece of equipment    Resistance Training Performed Yes    VAD Patient? No    PAD/SET Patient? No      Pain Assessment   Currently in Pain? No/denies                Social History   Tobacco Use  Smoking Status Never  Smokeless Tobacco Never    Goals Met:  Independence with exercise equipment Exercise tolerated well No report of concerns or symptoms today Strength training completed today  Goals Unmet:  Not Applicable  Comments: Pt able to follow exercise prescription today without complaint.  Will continue to monitor for progression.    Dr. Bethann Punches is Medical Director for [facility] Cardiac Rehabilitation.  Dr. Vida Rigger is Medical Director for [facility] Pulmonary Rehabilitation.

## 2023-08-25 ENCOUNTER — Emergency Department (HOSPITAL_COMMUNITY): Payer: Medicare Other

## 2023-08-25 ENCOUNTER — Emergency Department (HOSPITAL_COMMUNITY)
Admission: EM | Admit: 2023-08-25 | Discharge: 2023-08-25 | Disposition: A | Discharge status: Home / Self Care | Payer: Medicare Other | Attending: Emergency Medicine | Admitting: Emergency Medicine

## 2023-08-25 ENCOUNTER — Encounter (HOSPITAL_COMMUNITY): Payer: Self-pay

## 2023-08-25 DIAGNOSIS — Z79899 Other long term (current) drug therapy: Secondary | ICD-10-CM | POA: Insufficient documentation

## 2023-08-25 DIAGNOSIS — S80212A Abrasion, left knee, initial encounter: Secondary | ICD-10-CM | POA: Insufficient documentation

## 2023-08-25 DIAGNOSIS — W010XXA Fall on same level from slipping, tripping and stumbling without subsequent striking against object, initial encounter: Secondary | ICD-10-CM | POA: Insufficient documentation

## 2023-08-25 DIAGNOSIS — Y92009 Unspecified place in unspecified non-institutional (private) residence as the place of occurrence of the external cause: Secondary | ICD-10-CM | POA: Diagnosis not present

## 2023-08-25 DIAGNOSIS — S0990XA Unspecified injury of head, initial encounter: Secondary | ICD-10-CM | POA: Insufficient documentation

## 2023-08-25 DIAGNOSIS — S80912A Unspecified superficial injury of left knee, initial encounter: Secondary | ICD-10-CM | POA: Diagnosis present

## 2023-08-25 DIAGNOSIS — Z7902 Long term (current) use of antithrombotics/antiplatelets: Secondary | ICD-10-CM | POA: Diagnosis not present

## 2023-08-25 DIAGNOSIS — W19XXXA Unspecified fall, initial encounter: Secondary | ICD-10-CM

## 2023-08-25 DIAGNOSIS — Z7901 Long term (current) use of anticoagulants: Secondary | ICD-10-CM | POA: Insufficient documentation

## 2023-08-25 LAB — COMPREHENSIVE METABOLIC PANEL
ALT: 17 U/L (ref 0–44)
AST: 23 U/L (ref 15–41)
Albumin: 3.7 g/dL (ref 3.5–5.0)
Alkaline Phosphatase: 100 U/L (ref 38–126)
Anion gap: 11 (ref 5–15)
BUN: 22 mg/dL (ref 8–23)
CO2: 23 mmol/L (ref 22–32)
Calcium: 8.8 mg/dL — ABNORMAL LOW (ref 8.9–10.3)
Chloride: 98 mmol/L (ref 98–111)
Creatinine, Ser: 1.44 mg/dL — ABNORMAL HIGH (ref 0.61–1.24)
GFR, Estimated: 47 mL/min — ABNORMAL LOW (ref >60)
Glucose, Bld: 116 mg/dL — ABNORMAL HIGH (ref 70–99)
Potassium: 4 mmol/L (ref 3.5–5.1)
Sodium: 132 mmol/L — ABNORMAL LOW (ref 135–145)
Total Bilirubin: 1.6 mg/dL — ABNORMAL HIGH (ref 0.3–1.2)
Total Protein: 6.3 g/dL — ABNORMAL LOW (ref 6.5–8.1)

## 2023-08-25 LAB — URINALYSIS, ROUTINE W REFLEX MICROSCOPIC
Bacteria, UA: NONE SEEN
Bilirubin Urine: NEGATIVE
Glucose, UA: >=500 mg/dL — AB
Hgb urine dipstick: NEGATIVE
Ketones, ur: NEGATIVE mg/dL
Leukocytes,Ua: NEGATIVE
Nitrite: NEGATIVE
Protein, ur: NEGATIVE mg/dL
Specific Gravity, Urine: 1.006 (ref 1.005–1.030)
pH: 5 (ref 5.0–8.0)

## 2023-08-25 LAB — CBC
HCT: 39 % (ref 39.0–52.0)
Hemoglobin: 13.2 g/dL (ref 13.0–17.0)
MCH: 31.6 pg (ref 26.0–34.0)
MCHC: 33.8 g/dL (ref 30.0–36.0)
MCV: 93.3 fL (ref 80.0–100.0)
Platelets: 170 10*3/uL (ref 150–400)
RBC: 4.18 MIL/uL — ABNORMAL LOW (ref 4.22–5.81)
RDW: 14.5 % (ref 11.5–15.5)
WBC: 8.3 10*3/uL (ref 4.0–10.5)
nRBC: 0 % (ref 0.0–0.2)

## 2023-08-25 NOTE — ED Provider Notes (Signed)
EMERGENCY DEPARTMENT AT [facility] Provider Note   CSN: [id number] Arrival date & time: 08/25/23  0110     History  Chief Complaint  Patient presents with   [name]. is a 89 y.o. male.  Presents to the department for evaluation of fall.  Patient does take Eliquis and Plavix.  He had a fall earlier, complains of a small scrape on his left knee, otherwise no injuries.  No headache, neck pain, back pain.       Home Medications Prior to Admission medications   Medication Sig Start Date End Date Taking? Authorizing Provider  acetaminophen (TYLENOL) 650 MG CR tablet Take 650 mg by mouth every 8 (eight) hours as needed.    Yes [provider]  apixaban (ELIQUIS) 2.5 MG TABS tablet Take 1 tablet (2.5 mg total) by mouth 2 (two) times daily. 07/02/23  Yes Antonieta Iba, MD  Ascorbic Acid (VITAMIN C) 1000 MG tablet Take 1,000 mg by mouth daily.   Yes [provider]  atorvastatin (LIPITOR) 20 MG tablet Take 20 mg by mouth at bedtime.   Yes [provider]  b complex vitamins tablet Take 1 tablet by mouth daily.   Yes [provider]  cholecalciferol (VITAMIN D) 1000 units tablet Take 2,000 Units by mouth daily.   Yes [provider]  clopidogrel (PLAVIX) 75 MG tablet Take 1 tablet (75 mg total) by mouth daily. 07/16/23  Yes Gollan, Tollie Pizza, MD  cyanocobalamin (VITAMIN B12) 1000 MCG tablet Take 1,000 mcg by mouth daily.   Yes [provider]  docusate sodium (COLACE) 100 MG capsule Take 100 mg by mouth 2 (two) times daily.   Yes [provider]  ezetimibe (ZETIA) 10 MG tablet Take 1 tablet (10 mg total) by mouth daily. 07/02/23  Yes Gollan, Tollie Pizza, MD  FLAXSEED, LINSEED, PO Take 2 g by mouth daily.   Yes [provider]  metoprolol succinate (TOPROL-XL) 25 MG 24 hr tablet Take 1 tablet (25 mg total) by mouth daily. 07/02/23 07/01/24 Yes Gollan, Tollie Pizza, MD   nitroGLYCERIN (NITROSTAT) 0.4 MG SL tablet Place 1 tablet (0.4 mg total) under the tongue every 5 (five) minutes x 3 doses as needed for chest pain. 06/18/23  Yes Perlie Gold, PA-C  pantoprazole (PROTONIX) 40 MG tablet Take 1 tablet (40 mg total) by mouth 2 (two) times daily. 01/11/23 08/25/23 Yes Willeen Niece, MD  potassium chloride (KLOR-CON) 10 MEQ tablet Take 1 tablet (10 mEq total) by mouth daily. 07/02/23  Yes Antonieta Iba, MD  pregabalin (LYRICA) 100 MG capsule Take 100 mg by mouth in the morning. 05/23/22  Yes [provider]  pregabalin (LYRICA) 150 MG capsule Take 150 mg by mouth at bedtime. 05/23/22  Yes [provider]  Probiotic Product (UP4 PROBIOTICS PO) Take 7.5 mg by mouth.   Yes [provider]  ranolazine (RANEXA) 500 MG 12 hr tablet Take 1 tablet (500 mg total) by mouth 2 (two) times daily. 07/02/23  Yes Antonieta Iba, MD  tamsulosin (FLOMAX) 0.4 MG CAPS capsule Take 2 capsules (0.8 mg total) by mouth daily. 04/21/19  Yes Vanna Scotland, MD  zinc sulfate 220 (50 Zn) MG capsule Take 1 tablet by mouth daily. 07/19/22  Yes [provider]  clindamycin (CLEOCIN) 300 MG capsule Take 300 mg by mouth every 6 (six) hours. Patient not taking: Reported on 08/25/2023 08/12/23   [provider]  furosemide (  LASIX) 20 MG tablet Take 1 tablet (20 mg total) by mouth daily. Patient not taking: Reported on 08/25/2023 07/02/23 07/01/24  Antonieta Iba, MD  isosorbide mononitrate (IMDUR) 30 MG 24 hr tablet Take 0.5 tablets (15 mg total) by mouth daily. Patient not taking: Reported on 08/25/2023 07/02/23   Antonieta Iba, MD  losartan (COZAAR) 25 MG tablet Take 25 mg by mouth daily. Patient not taking: Reported on 08/25/2023 07/24/23   [provider]  spironolactone (ALDACTONE) 25 MG tablet Take by mouth. Patient not taking: Reported on 08/25/2023 08/05/23   [provider]      Allergies    Penicillins, Gabapentin, Penicillin g,  and Codeine    Review of Systems   Review of Systems  Physical Exam Updated Vital Signs BP 129/69   Pulse 63   Temp 98.2 F (36.8 C) (Oral)   Resp 14   Ht 5\' 9"  (1.753 m)   Wt 78.9 kg   SpO2 97%   BMI 25.69 kg/m  Physical Exam Vitals and nursing note reviewed.  Constitutional:      General: He is not in acute distress.    Appearance: He is well-developed.  HENT:     Head: Normocephalic and atraumatic.     Mouth/Throat:     Mouth: Mucous membranes are moist.  Eyes:     General: Vision grossly intact. Gaze aligned appropriately.     Extraocular Movements: Extraocular movements intact.     Conjunctiva/sclera: Conjunctivae normal.  Cardiovascular:     Rate and Rhythm: Normal rate and regular rhythm.     Pulses: Normal pulses.     Heart sounds: Normal heart sounds, S1 normal and S2 normal. No murmur heard.    No friction rub. No gallop.  Pulmonary:     Effort: Pulmonary effort is normal. No respiratory distress.     Breath sounds: Normal breath sounds.  Abdominal:     Palpations: Abdomen is soft.     Tenderness: There is no abdominal tenderness. There is no guarding or rebound.     Hernia: No hernia is present.  Musculoskeletal:        General: No swelling.     Cervical back: Full passive range of motion without pain, normal range of motion and neck supple. No pain with movement, spinous process tenderness or muscular tenderness. Normal range of motion.     Left knee: No swelling, deformity, erythema or ecchymosis. Normal range of motion. No tenderness.     Right lower leg: No edema.     Left lower leg: No edema.  Skin:    General: Skin is warm and dry.     Capillary Refill: Capillary refill takes less than 2 seconds.     Findings: Abrasion (Superficial, left knee) present. No ecchymosis, erythema, lesion or wound.  Neurological:     Mental Status: He is alert and oriented to person, place, and time.     GCS: GCS eye subscore is 4. GCS verbal subscore is 5. GCS motor  subscore is 6.     Cranial Nerves: Cranial nerves 2-12 are intact.     Sensory: Sensation is intact.     Motor: Motor function is intact. No weakness or abnormal muscle tone.     Coordination: Coordination is intact.  Psychiatric:        Mood and Affect: Mood normal.        Speech: Speech normal.        Behavior: Behavior normal.  ED Results / Procedures / Treatments   Labs (all labs ordered are listed, but only abnormal results are displayed) Labs Reviewed  COMPREHENSIVE METABOLIC PANEL - Abnormal; Notable for the following components:      Result Value   Sodium 132 (*)    Glucose, Bld 116 (*)    Creatinine, Ser 1.44 (*)    Calcium 8.8 (*)    Total Protein 6.3 (*)    Total Bilirubin 1.6 (*)    GFR, Estimated 47 (*)    All other components within normal limits  CBC - Abnormal; Notable for the following components:   RBC 4.18 (*)    All other components within normal limits  URINALYSIS, ROUTINE W REFLEX MICROSCOPIC - Abnormal; Notable for the following components:   Glucose, UA >=500 (*)    All other components within normal limits  PROTIME-INR    EKG None  Radiology CT HEAD WO CONTRAST  Result Date: 08/25/2023 CLINICAL DATA:  Head trauma, pain EXAM: CT HEAD WITHOUT CONTRAST TECHNIQUE: Contiguous axial images were obtained from the base of the skull through the vertex without intravenous contrast. RADIATION DOSE REDUCTION: This exam was performed according to the departmental dose-optimization program which includes automated exposure control, adjustment of the mA and/or kV according to patient size and/or use of iterative reconstruction technique. COMPARISON:  05/11/2023 FINDINGS: Brain: No evidence of acute infarction, hemorrhage, mass, mass effect, or midline shift. No hydrocephalus or extra-axial fluid collection. 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 Periventricular white matter changes, likely the sequela of chronic  small vessel ischemic disease. More advanced volume loss in the bilateral temporal lobes. Vascular: No hyperdense vessel. Skull: Negative for fracture or focal lesion. Sinuses/Orbits: Mucosal thickening in the ethmoid air cells. No acute finding in the orbits. Status post bilateral lens replacements. Other: The mastoid air cells are well aerated. IMPRESSION: No acute intracranial process. Electronically Signed   By: Wiliam Ke M.D.   On: 08/25/2023 01:55    Procedures Procedures    Medications Ordered in ED Medications - No data to display  ED Course/ Medical Decision Making/ A&P                                 Medical Decision Making Amount and/or Complexity of Data Reviewed Labs: ordered. Decision-making details documented in ED Course. Radiology: ordered and independent interpretation performed. Decision-making details documented in ED Course.   Differential diagnosis considered includes, but not limited to: Concussion; ICH;   Presents to the emergency department after a ground-level fall.  Patient not sure what caused the fall.  There was no loss of consciousness.  Is on Eliquis and Plavix.  For this reason he head CT.  Neck was clinically cleared.  No thoracic or lumbar tenderness.  Patient with a very slight bruise on the left knee but no signs of bony injury.  CT unremarkable.  Basic labs unremarkable.  Patient at his baseline, requesting discharge.        Final Clinical Impression(s) / ED Diagnoses Final diagnoses:  Fall in home, initial encounter    Rx / DC Orders ED Discharge Orders     None         Carmina Walle, Canary Brim, MD 08/25/23 [id number]

## 2023-08-25 NOTE — Progress Notes (Signed)
   08/25/23 0157  Spiritual Encounters  Type of Visit Initial  Care provided to: Patient  Conversation partners present during encounter Nurse  Referral source Trauma page  Reason for visit Trauma  OnCall Visit Yes   Responded to level 2 fall on thinners

## 2023-08-25 NOTE — ED Triage Notes (Signed)
Pt si coming in for a fall on thinners tonight with head injury but no LOC. No open wounds at this time and no bleeding. There is an abrasion on his left knee. Does have a Hx of a brain bleed.

## 2023-08-28 ENCOUNTER — Encounter: Payer: Medicare Other | Admitting: *Deleted

## 2023-08-28 DIAGNOSIS — I214 Non-ST elevation (NSTEMI) myocardial infarction: Secondary | ICD-10-CM | POA: Diagnosis not present

## 2023-08-28 NOTE — Progress Notes (Signed)
Daily Session Note  Patient Details  Name: [name]. MRN: [id number] Date of Birth: 07/12/1933 Referring Provider:    Encounter Date: 08/28/2023  Check In:  Session Check In - 08/28/23 1406       Check-In   Supervising physician immediately available to respond to emergencies See telemetry face sheet for immediately available ER MD    Location [facility]-Cardiac & Pulmonary Rehab    Staff Present Lanny Hurst, RN, Franki Monte, BS, ACSM CEP, Exercise Physiologist;Jonnatan Hanners Jewel Baize, RN BSN    Virtual Visit No    Medication changes reported     No    Fall or balance concerns reported    No    Warm-up and Cool-down Performed on first and last piece of equipment    Resistance Training Performed Yes    VAD Patient? No    PAD/SET Patient? No      Pain Assessment   Currently in Pain? No/denies                Social History   Tobacco Use  Smoking Status Never  Smokeless Tobacco Never    Goals Met:  Independence with exercise equipment Exercise tolerated well No report of concerns or symptoms today Strength training completed today  Goals Unmet:  Not Applicable  Comments: Pt able to follow exercise prescription today without complaint.  Will continue to monitor for progression.    Dr. Bethann Punches is Medical Director for [facility] Cardiac Rehabilitation.  Dr. Vida Rigger is Medical Director for [facility] Pulmonary Rehabilitation.

## 2023-08-29 ENCOUNTER — Encounter: Payer: Medicare Other | Admitting: *Deleted

## 2023-08-29 DIAGNOSIS — I214 Non-ST elevation (NSTEMI) myocardial infarction: Secondary | ICD-10-CM

## 2023-08-29 NOTE — Progress Notes (Signed)
Daily Session Note  Patient Details  Name: [name]. MRN: [id number] Date of Birth: 22-Jan-1933 Referring Provider:    Encounter Date: 08/29/2023  Check In:  Session Check In - 08/29/23 1350       Check-In   Supervising physician immediately available to respond to emergencies See telemetry face sheet for immediately available ER MD    Location [facility]    Staff Present Elige Ko, RCP,RRT,BSRT;Maxon Conetta BS, , Exercise Physiologist;[place] Pechacek Katrinka Blazing, RN, ADN    Virtual Visit No    Medication changes reported     No    Fall or balance concerns reported    No    Warm-up and Cool-down Performed on first and last piece of equipment    Resistance Training Performed Yes    VAD Patient? No    PAD/SET Patient? No      Pain Assessment   Currently in Pain? No/denies                Social History   Tobacco Use  Smoking Status Never  Smokeless Tobacco Never    Goals Met:  Independence with exercise equipment Exercise tolerated well No report of concerns or symptoms today Strength training completed today  Goals Unmet:  Not Applicable  Comments: Pt able to follow exercise prescription today without complaint.  Will continue to monitor for progression.    Dr. Bethann Punches is Medical Director for [facility].  Dr. Vida Rigger is Medical Director for [facility].

## 2023-09-02 ENCOUNTER — Encounter: Payer: Medicare Other | Admitting: *Deleted

## 2023-09-02 DIAGNOSIS — I214 Non-ST elevation (NSTEMI) myocardial infarction: Secondary | ICD-10-CM

## 2023-09-02 NOTE — Progress Notes (Signed)
Daily Session Note  Patient Details  Name: [name]. MRN: [id number] Date of Birth: 05-01-1933 Referring Provider:    Encounter Date: 09/02/2023  Check In:  Session Check In - 09/02/23 1409       Check-In   Supervising physician immediately available to respond to emergencies See telemetry face sheet for immediately available ER MD    Location [facility]    Staff Present Maxon Suzzette Righter, , Exercise Physiologist;Devine Dant Jewel Baize, RN BSN;Joseph Reino Kent, RCP,RRT,BSRT    Virtual Visit No    Medication changes reported     No    Fall or balance concerns reported    No    Warm-up and Cool-down Performed on first and last piece of equipment    Resistance Training Performed Yes    VAD Patient? No    PAD/SET Patient? No      Pain Assessment   Currently in Pain? No/denies                Social History   Tobacco Use  Smoking Status Never  Smokeless Tobacco Never    Goals Met:  Independence with exercise equipment Exercise tolerated well No report of concerns or symptoms today Strength training completed today  Goals Unmet:  Not Applicable  Comments: Pt able to follow exercise prescription today without complaint.  Will continue to monitor for progression.    Dr. Bethann Punches is Medical Director for [facility].  Dr. Vida Rigger is Medical Director for [facility].

## 2023-09-04 ENCOUNTER — Encounter: Payer: Self-pay | Admitting: *Deleted

## 2023-09-04 ENCOUNTER — Encounter: Payer: Medicare Other | Attending: Cardiology | Admitting: *Deleted

## 2023-09-04 DIAGNOSIS — I214 Non-ST elevation (NSTEMI) myocardial infarction: Secondary | ICD-10-CM

## 2023-09-04 DIAGNOSIS — Z48812 Encounter for surgical aftercare following surgery on the circulatory system: Secondary | ICD-10-CM | POA: Diagnosis not present

## 2023-09-04 DIAGNOSIS — I252 Old myocardial infarction: Secondary | ICD-10-CM | POA: Diagnosis not present

## 2023-09-04 NOTE — Progress Notes (Signed)
Cardiac Individual Treatment Plan  Patient Details  Name: [name]. MRN: [id number] Date of Birth: [birth year] Referring Provider:    Initial Encounter Date:   Visit Diagnosis: NSTEMI (non-ST elevated myocardial infarction) (HCC)  Patient's Home Medications on Admission:  Current Outpatient Medications:    acetaminophen (TYLENOL) 650 MG CR tablet, Take 650 mg by mouth every 8 (eight) hours as needed. , Disp: , Rfl:    apixaban (ELIQUIS) 2.5 MG TABS tablet, Take 1 tablet (2.5 mg total) by mouth 2 (two) times daily., Disp: 180 tablet, Rfl: 3   Ascorbic Acid (VITAMIN C) 1000 MG tablet, Take 1,000 mg by mouth daily., Disp: , Rfl:    atorvastatin (LIPITOR) 20 MG tablet, Take 20 mg by mouth at bedtime., Disp: , Rfl:    b complex vitamins tablet, Take 1 tablet by mouth daily., Disp: , Rfl:    cholecalciferol (VITAMIN D) 1000 units tablet, Take 2,000 Units by mouth daily., Disp: , Rfl:    clindamycin (CLEOCIN) 300 MG capsule, Take 300 mg by mouth every 6 (six) hours. (Patient not taking: Reported on 08/25/2023), Disp: , Rfl:    clopidogrel (PLAVIX) 75 MG tablet, Take 1 tablet (75 mg total) by mouth daily., Disp: 90 tablet, Rfl: 0   cyanocobalamin (VITAMIN B12) 1000 MCG tablet, Take 1,000 mcg by mouth daily., Disp: , Rfl:    docusate sodium (COLACE) 100 MG capsule, Take 100 mg by mouth 2 (two) times daily., Disp: , Rfl:    ezetimibe (ZETIA) 10 MG tablet, Take 1 tablet (10 mg total) by mouth daily., Disp: 90 tablet, Rfl: 3   FLAXSEED, LINSEED, PO, Take 2 g by mouth daily., Disp: , Rfl:    furosemide (LASIX) 20 MG tablet, Take 1 tablet (20 mg total) by mouth daily. (Patient not taking: Reported on 08/25/2023), Disp: 90 tablet, Rfl: 3   isosorbide mononitrate (IMDUR) 30 MG 24 hr tablet, Take 0.5 tablets (15 mg total) by mouth daily. (Patient not taking: Reported on 08/25/2023), Disp: 90 tablet, Rfl: 3   losartan (COZAAR) 25 MG tablet, Take 25 mg by mouth daily. (Patient not taking:  Reported on 08/25/2023), Disp: , Rfl:    metoprolol succinate (TOPROL-XL) 25 MG 24 hr tablet, Take 1 tablet (25 mg total) by mouth daily., Disp: 30 tablet, Rfl: 11   nitroGLYCERIN (NITROSTAT) 0.4 MG SL tablet, Place 1 tablet (0.4 mg total) under the tongue every 5 (five) minutes x 3 doses as needed for chest pain., Disp: 25 tablet, Rfl: 0   pantoprazole (PROTONIX) 40 MG tablet, Take 1 tablet (40 mg total) by mouth 2 (two) times daily., Disp: 60 tablet, Rfl: 1   potassium chloride (KLOR-CON) 10 MEQ tablet, Take 1 tablet (10 mEq total) by mouth daily., Disp: 90 tablet, Rfl: 3   pregabalin (LYRICA) 100 MG capsule, Take 100 mg by mouth in the morning., Disp: , Rfl:    pregabalin (LYRICA) 150 MG capsule, Take 150 mg by mouth at bedtime., Disp: , Rfl:    Probiotic Product (UP4 PROBIOTICS PO), Take 7.5 mg by mouth., Disp: , Rfl:    ranolazine (RANEXA) 500 MG 12 hr tablet, Take 1 tablet (500 mg total) by mouth 2 (two) times daily., Disp: 180 tablet, Rfl: 3   spironolactone (ALDACTONE) 25 MG tablet, Take by mouth. (Patient not taking: Reported on 08/25/2023), Disp: , Rfl:    tamsulosin (FLOMAX) 0.4 MG CAPS capsule, Take 2 capsules (0.8 mg total) by mouth daily., Disp: 30 capsule, Rfl: 0   zinc sulfate  220 (50 Zn) MG capsule, Take 1 tablet by mouth daily., Disp: , Rfl:   Past Medical History: Past Medical History:  Diagnosis Date   Acute embolic stroke (HCC) 02/01/2017   BPH (benign prostatic hyperplasia)    Bradycardia 11/10/2015   Coronary artery disease    Dizziness 08/21/2012   DYSPEPSIA 07/31/2010   Qualifier: Diagnosis of  By: Janee Morn, RN, Morrie Sheldon     Hyperlipidemia 05/16/2010   Qualifier: Diagnosis of  By: Mariah Milling MD, [name]      Hypertension    HYPERTENSION, BENIGN 05/16/2010   Qualifier: Diagnosis of  By: Mariah Milling MD, [name]     Stenosis of right carotid artery 02/01/2017    Tobacco Use: Social History   Tobacco Use  Smoking Status Never  Smokeless Tobacco Never    Labs: Review Flowsheet        Latest Ref Rng & Units 01/23/2010 06/15/2023 06/16/2023  Labs for ITP Cardiac and Pulmonary Rehab  Cholestrol 0 - 200 mg/dL 696  - 90   LDL (calc) 0 - 99 mg/dL 29.5  - 44   HDL-C >28 mg/dL 41.3  - 40   Trlycerides <150 mg/dL 93  - 30   Hemoglobin K4M 4.8 - 5.6 % 5.4  6.3  6.3  -    Details       Multiple values from one day are sorted in reverse-chronological order          Exercise Target Goals: Exercise Program Goal: Individual exercise prescription set using results from initial 6 min walk test and THRR while considering  patient's activity barriers and safety.   Exercise Prescription Goal: Initial exercise prescription builds to 30-45 minutes a day of aerobic activity, 2-3 days per week.  Home exercise guidelines will be given to patient during program as part of exercise prescription that the participant will acknowledge.   Education: Aerobic Exercise: - Group verbal and visual presentation on the components of exercise prescription. Introduces F.I.T.T principle from ACSM for exercise prescriptions.  Reviews F.I.T.T. principles of aerobic exercise including progression. Written material given at graduation. Flowsheet Row Cardiac Rehab from 07/15/2023 in [facility] Cardiac and Pulmonary Rehab  Education need identified 07/15/23       Education: Resistance Exercise: - Group verbal and visual presentation on the components of exercise prescription. Introduces F.I.T.T principle from ACSM for exercise prescriptions  Reviews F.I.T.T. principles of resistance exercise including progression. Written material given at graduation.    Education: Exercise & Equipment Safety: - Individual verbal instruction and demonstration of equipment use and safety with use of the equipment. Flowsheet Row Cardiac Rehab from 07/15/2023 in [facility] Cardiac and Pulmonary Rehab  Date 07/15/23  Educator [employer]  Instruction Review Code 1- Verbalizes Understanding       Education: Exercise Physiology & General  Exercise Guidelines: - Group verbal and written instruction with models to review the exercise physiology of the cardiovascular system and associated critical values. Provides general exercise guidelines with specific guidelines to those with heart or lung disease.    Education: Flexibility, Balance, Mind/Body Relaxation: - Group verbal and visual presentation with interactive activity on the components of exercise prescription. Introduces F.I.T.T principle from ACSM for exercise prescriptions. Reviews F.I.T.T. principles of flexibility and balance exercise training including progression. Also discusses the mind body connection.  Reviews various relaxation techniques to help reduce and manage stress (i.e. Deep breathing, progressive muscle relaxation, and visualization). Balance handout provided to take home. Written material given at graduation.   Activity Barriers & Risk Stratification:  Activity Barriers & Cardiac Risk Stratification - 07/15/23 1443       Activity Barriers & Cardiac Risk Stratification   Activity Barriers Arthritis;Back Problems;Balance Concerns;History of Falls;Muscular Weakness;Other (comment)    Comments Tremors in hands, neuropathy in feet    Cardiac Risk Stratification High             6 Minute Walk:  6 Minute Walk     Row Name 07/15/23 1438         6 Minute Walk   Phase Initial     Distance 780 feet     Walk Time 6 minutes     # of Rest Breaks 0     MPH 1.5     METS 1.03     RPE 13     Perceived Dyspnea  1     VO2 Peak 3.6     Symptoms No     Resting HR 83 bpm     Resting BP 100/56     Resting Oxygen Saturation  97 %     Exercise Oxygen Saturation  during 6 min walk 100 %     Max Ex. HR 93 bpm     Max Ex. BP 120/60     2 Minute Post BP 120/62              Oxygen Initial Assessment:   Oxygen Re-Evaluation:   Oxygen Discharge (Final Oxygen Re-Evaluation):   Initial Exercise Prescription:  Initial Exercise Prescription - 07/15/23  1500       Recumbant Bike   RPM 50    Watts 15    Minutes 15    METs 1.03      NuStep   Level 1    SPM 80    Minutes 15    METs 1.03      Track   Laps 10    Minutes 15    METs 1.54      Prescription Details   Frequency (times per week) 3    Duration Progress to 30 minutes of continuous aerobic without signs/symptoms of physical distress      Intensity   THRR 40-80% of Max Heartrate 103-123    Ratings of Perceived Exertion 11-13    Perceived Dyspnea 0-4      Progression   Progression Continue to progress workloads to maintain intensity without signs/symptoms of physical distress.      Resistance Training   Training Prescription Yes    Weight 3lb    Reps 10-15             Perform Capillary Blood Glucose checks as needed.  Exercise Prescription Changes:   Exercise Prescription Changes     Row Name 07/15/23 1500 07/23/23 1100 08/07/23 1100 08/19/23 1100       Response to Exercise   Blood Pressure (Admit) 100/56 122/68 110/58 102/58    Blood Pressure (Exercise) 120/60 128/70 124/60 126/62    Blood Pressure (Exit) 120/62 122/52 114/62 102/60    Heart Rate (Admit) 83 bpm 82 bpm 73 bpm 94 bpm    Heart Rate (Exercise) 93 bpm 101 bpm 101 bpm 98 bpm    Heart Rate (Exit) 71 bpm 84 bpm 82 bpm 80 bpm    Oxygen Saturation (Admit) 97 % -- -- --    Oxygen Saturation (Exercise) 100 % -- -- --    Rating of Perceived Exertion (Exercise) 13 13 13 14     Perceived Dyspnea (Exercise) 1 -- -- 0  Symptoms None -- none none    Comments results -- -- --    Duration -- Continue with 30 min of aerobic exercise without signs/symptoms of physical distress. Continue with 30 min of aerobic exercise without signs/symptoms of physical distress. Continue with 30 min of aerobic exercise without signs/symptoms of physical distress.    Intensity -- THRR unchanged THRR unchanged THRR unchanged      Progression   Progression -- Continue to progress workloads to maintain intensity  without signs/symptoms of physical distress. Continue to progress workloads to maintain intensity without signs/symptoms of physical distress. Continue to progress workloads to maintain intensity without signs/symptoms of physical distress.    Average METs -- 1.93 1.69 1.8      Resistance Training   Training Prescription -- Yes Yes Yes    Weight -- 3lb 3lb 3lb    Reps -- 10-15 10-15 10-15      Interval Training   Interval Training -- -- No No      NuStep   Level -- 1 1 2     Minutes -- 30 15 15     METs -- 1.5 1.6 --      T5 Nustep   Level -- 1 1  T6 nustep 2  T6    SPM -- 80 -- --    Minutes -- 30 15 15     METs -- 2.3 2.2 1.5      Biostep-RELP   Level -- -- 1 --    Minutes -- -- 15 --    METs -- -- 2 --      Track   Laps -- -- 13  Hallway 19    Minutes -- -- 15 15    METs -- -- 1.55 2.03      Oxygen   Maintain Oxygen Saturation -- -- 88% or higher 88% or higher             Exercise Comments:   Exercise Comments     Row Name 07/17/23 1430           Exercise Comments First full day of exercise!  Patient was oriented to gym and equipment including functions, settings, policies, and procedures.  Patient's individual exercise prescription and treatment plan were reviewed.  All starting workloads were established based on the results of the 6 minute walk test done at initial orientation visit.  The plan for exercise progression was also introduced and progression will be customized based on patient's performance and goals.                Exercise Goals and Review:   Exercise Goals     Row Name 07/15/23 1515             Exercise Goals   Increase Physical Activity Yes       Expected Outcomes Short Term: Attend rehab on a regular basis to increase amount of physical activity.;Long Term: Add in home exercise to make exercise part of routine and to increase amount of physical activity.;Long Term: Exercising regularly at least 3-5 days a week.       Increase  Strength and Stamina Yes       Expected Outcomes Short Term: Increase workloads from initial exercise prescription for resistance, speed, and METs.;Short Term: Perform resistance training exercises routinely during rehab and add in resistance training at home;Long Term: Improve cardiorespiratory fitness, muscular endurance and strength as measured by increased METs and functional capacity ( )       Able to  understand and use rate of perceived exertion (RPE) scale Yes       Intervention Provide education and explanation on how to use RPE scale       Expected Outcomes Short Term: Able to use RPE daily in rehab to express subjective intensity level;Long Term:  Able to use RPE to guide intensity level when exercising independently       Able to understand and use Dyspnea scale Yes       Intervention Provide education and explanation on how to use Dyspnea scale       Expected Outcomes Short Term: Able to use Dyspnea scale daily in rehab to express subjective sense of shortness of breath during exertion;Long Term: Able to use Dyspnea scale to guide intensity level when exercising independently       Knowledge and understanding of Target Heart Rate Range (THRR) Yes       Intervention Provide education and explanation of THRR including how the numbers were predicted and where they are located for reference       Expected Outcomes Short Term: Able to state/look up THRR;Short Term: Able to use daily as guideline for intensity in rehab;Long Term: Able to use THRR to govern intensity when exercising independently       Able to check pulse independently Yes       Intervention Provide education and demonstration on how to check pulse in carotid and radial arteries.;Review the importance of being able to check your own pulse for safety during independent exercise       Expected Outcomes Short Term: Able to explain why pulse checking is important during independent exercise;Long Term: Able to check pulse  independently and accurately       Understanding of Exercise Prescription Yes       Intervention Provide education, explanation, and written materials on patient's individual exercise prescription       Expected Outcomes Short Term: Able to explain program exercise prescription;Long Term: Able to explain home exercise prescription to exercise independently                Exercise Goals Re-Evaluation :  Exercise Goals Re-Evaluation     Row Name 07/17/23 1430 07/23/23 1156 08/07/23 1121 08/19/23 1139       Exercise Goal Re-Evaluation   Exercise Goals Review Increase Physical Activity;Able to understand and use rate of perceived exertion (RPE) scale;Knowledge and understanding of Target Heart Rate Range (THRR);Understanding of Exercise Prescription;Increase Strength and Stamina;Able to check pulse independently Increase Physical Activity;Understanding of Exercise Prescription;Increase Strength and Stamina Increase Physical Activity;Understanding of Exercise Prescription;Increase Strength and Stamina Increase Physical Activity;Understanding of Exercise Prescription;Increase Strength and Stamina    Comments Reviewed RPE  and dyspnea scale, THR and program prescription with pt today.  Pt voiced understanding and was given a copy of goals to take home. Kaleef is off to a good start in the program. He has done the nustep for the entire duration of the aerobic session. We will continue to monitor his progress in the program. Buford is doing well in rehab. He has continued to work at level 1 on the T4 nustep, T6 nustep, and biostep. He also began walking and was able to walk 13 laps in the hallway. We will continue to monitor his progress in the program. Abdifatah is doing well in rehab. He recently has increased his T6 nustep, and T4 nustep level from 1 to 2. He also has been able to walk 19 laps on the track. We will  continue to monitor his progress in the program.    Expected Outcomes Short: Use RPE daily  to regulate intensity.  Long: Follow program prescription in THR. Short: Continue to follow current exercise prescription. Try a nustep machine and a another machine for the aerobic portion of rehab. Long: Continue exercise to improve strength and stamina Short: Try level 2 on the T6 nustep. Long: Continue to increase overall METs and stamina. Short: Continue to follow current exercise prescription, and progressively increase workloads. Long: Continue exercise to improve strength and stamina.             Discharge Exercise Prescription (Final Exercise Prescription Changes):  Exercise Prescription Changes - 08/19/23 1100       Response to Exercise   Blood Pressure (Admit) 102/58    Blood Pressure (Exercise) 126/62    Blood Pressure (Exit) 102/60    Heart Rate (Admit) 94 bpm    Heart Rate (Exercise) 98 bpm    Heart Rate (Exit) 80 bpm    Rating of Perceived Exertion (Exercise) 14    Perceived Dyspnea (Exercise) 0    Symptoms none    Duration Continue with 30 min of aerobic exercise without signs/symptoms of physical distress.    Intensity THRR unchanged      Progression   Progression Continue to progress workloads to maintain intensity without signs/symptoms of physical distress.    Average METs 1.8      Resistance Training   Training Prescription Yes    Weight 3lb    Reps 10-15      Interval Training   Interval Training No      NuStep   Level 2    Minutes 15      T5 Nustep   Level 2   T6   Minutes 15    METs 1.5      Track   Laps 19    Minutes 15    METs 2.03      Oxygen   Maintain Oxygen Saturation 88% or higher             Nutrition:  Target Goals: Understanding of nutrition guidelines, daily intake of sodium 1500mg , cholesterol 200mg , calories 30% from fat and 7% or less from saturated fats, daily to have 5 or more servings of fruits and vegetables.  Education: All About Nutrition: -Group instruction provided by verbal, written material, interactive  activities, discussions, models, and posters to present general guidelines for heart healthy nutrition including fat, fiber, MyPlate, the role of sodium in heart healthy nutrition, utilization of the nutrition label, and utilization of this knowledge for meal planning. Follow up email sent as well. Written material given at graduation. Flowsheet Row Cardiac Rehab from 07/15/2023 in [facility] Cardiac and Pulmonary Rehab  Education need identified 07/15/23       Biometrics:  Pre Biometrics - 07/15/23 1517       Pre Biometrics   Height 5' 8.6" (1.742 m)    Weight 174 lb (78.9 kg)    Waist Circumference 43.5 inches    Hip Circumference 39.2 inches    Waist to Hip Ratio 1.11 %    BMI (Calculated) 26.01    Single Leg Stand 0 seconds              Nutrition Therapy Plan and Nutrition Goals:  Nutrition Therapy & Goals - 07/17/23 1426       Nutrition Therapy   Diet Cardiac    Protein (specify units) >75g    Fiber 30  grams    Whole Grain Foods 3 servings    Saturated Fats 15 max. grams    Fruits and Vegetables 5 servings/day    Sodium 2 grams      Personal Nutrition Goals   Nutrition Goal Eat a protein at every meal    Personal Goal #2 Drink 64oz of water daily    Personal Goal #3 Pair protein with a carb    Comments Patient presents with wife. He is drinking ~64oz of water daily. Doesn't drink anything else other than a cup of black coffee in the morning. Spoke to them about setting goal to get at least 64oz consistently. Reviewed mediterranean diet handout, encouraged whole grains, healthy fats, and colorful produce. Spoke to them about nutrient and calorie density. Warned about filling up on low calorie veggies and not having enough room for protein and complex carbs. Wife took notes and asked questions, was able to teach back clearly. Set goal to eat a protein at every meal and to build more balanced meals. Provided some examples and brainstormed small meals and snacks with foods he  likes and will eat with focus on ensuring he meets calorie and protein goals to prevent unintended weight loss.      Intervention Plan   Intervention Prescribe, educate and counsel regarding individualized specific dietary modifications aiming towards targeted core components such as weight, hypertension, lipid management, diabetes, heart failure and other comorbidities.;Nutrition handout(s) given to patient.    Expected Outcomes Short Term Goal: Understand basic principles of dietary content, such as calories, fat, sodium, cholesterol and nutrients.;Short Term Goal: A plan has been developed with personal nutrition goals set during dietitian appointment.;Long Term Goal: Adherence to prescribed nutrition plan.             Nutrition Assessments:  MEDIFICTS Score Key: >=70 Need to make dietary changes  40-70 Heart Healthy Diet <= 40 Therapeutic Level Cholesterol Diet  Flowsheet Row Cardiac Rehab from 07/15/2023 in [facility] Cardiac and Pulmonary Rehab  Picture Your Plate Total Score on Admission 77      Picture Your Plate Scores: <62 Unhealthy dietary pattern with much room for improvement. 41-50 Dietary pattern unlikely to meet recommendations for good health and room for improvement. 51-60 More healthful dietary pattern, with some room for improvement.  >60 Healthy dietary pattern, although there may be some specific behaviors that could be improved.    Nutrition Goals Re-Evaluation:   Nutrition Goals Discharge (Final Nutrition Goals Re-Evaluation):   Psychosocial: Target Goals: Acknowledge presence or absence of significant depression and/or stress, maximize coping skills, provide positive support system. Participant is able to verbalize types and ability to use techniques and skills needed for reducing stress and depression.   Education: Stress, Anxiety, and Depression - Group verbal and visual presentation to define topics covered.  Reviews how body is impacted by stress,  anxiety, and depression.  Also discusses healthy ways to reduce stress and to treat/manage anxiety and depression.  Written material given at graduation. Flowsheet Row Cardiac Rehab from 07/15/2023 in [facility] Cardiac and Pulmonary Rehab  Education need identified 07/15/23       Education: Sleep Hygiene -Provides group verbal and written instruction about how sleep can affect your health.  Define sleep hygiene, discuss sleep cycles and impact of sleep habits. Review good sleep hygiene tips.    Initial Review & Psychosocial Screening:  Initial Psych Review & Screening - 07/04/23 1342       Initial Review   Current issues with Current Stress  Concerns    Source of Stress Concerns Unable to participate in former interests or hobbies      Family Dynamics   Good Support System? Yes      Barriers   Psychosocial barriers to participate in program There are no identifiable barriers or psychosocial needs.;The patient should benefit from training in stress management and relaxation.      Screening Interventions   Interventions Encouraged to exercise;Provide feedback about the scores to participant;To provide support and resources with identified psychosocial needs    Expected Outcomes Short Term goal: Utilizing psychosocial counselor, staff and physician to assist with identification of specific Stressors or current issues interfering with healing process. Setting desired goal for each stressor or current issue identified.;Long Term Goal: Stressors or current issues are controlled or eliminated.;Short Term goal: Identification and review with participant of any Quality of Life or Depression concerns found by scoring the questionnaire.;Long Term goal: The participant improves quality of Life and PHQ9 Scores as seen by post scores and/or verbalization of changes             Quality of Life Scores:   Quality of Life - 07/15/23 1526       Quality of Life   Select Quality of Life      Quality of  Life Scores   Health/Function Pre 20.14 %    Socioeconomic Pre 30 %    Psych/Spiritual Pre 30 %    Family Pre 30 %    GLOBAL Pre 25.69 %            Scores of 19 and below usually indicate a poorer quality of life in these areas.  A difference of  2-3 points is a clinically meaningful difference.  A difference of 2-3 points in the total score of the Quality of Life Index has been associated with significant improvement in overall quality of life, self-image, physical symptoms, and general health in studies assessing change in quality of life.  PHQ-9: Review Flowsheet       07/15/2023  Depression screen PHQ 2/9  Decreased Interest 0  Down, Depressed, Hopeless 0  PHQ - 2 Score 0  Altered sleeping 0  Tired, decreased energy 2  Change in appetite 1  Feeling bad or failure about yourself  0  Trouble concentrating 3  Moving slowly or fidgety/restless 0  Suicidal thoughts 0  PHQ-9 Score 6  Difficult doing work/chores Somewhat difficult    Details           Interpretation of Total Score  Total Score Depression Severity:  1-4 = Minimal depression, 5-9 = Mild depression, 10-14 = Moderate depression, 15-19 = Moderately severe depression, 20-27 = Severe depression   Psychosocial Evaluation and Intervention:  Psychosocial Evaluation - 07/04/23 1344       Psychosocial Evaluation & Interventions   Interventions Encouraged to exercise with the program and follow exercise prescription    Comments Mr. Klingsporn is coming to cardiac rehab after a NSTEMI. He had a stroke 5 years ago that caused a difficulty in word processing, so his wife mainly spoke on the phone during the orientation phone call. His main motivation for coming to the program is to get back to what he had been doing. He has felt weaker since his MI, so he wants to regain his strength back. His wife and he are worried about him falling because of his weakness, so he is ready to work on his stamina as well. He has a good  support  system. He reports no sleep concerns or concerns with anxiety or depression.    Expected Outcomes Short: attend cardiac rehab for education and exercise. Long: devleop and maintain positive self care habits    Continue Psychosocial Services  Follow up required by staff             Psychosocial Re-Evaluation:   Psychosocial Discharge (Final Psychosocial Re-Evaluation):   Vocational Rehabilitation: Provide vocational rehab assistance to qualifying candidates.   Vocational Rehab Evaluation & Intervention:  Vocational Rehab - 07/04/23 1342       Initial Vocational Rehab Evaluation & Intervention   Assessment shows need for Vocational Rehabilitation No             Education: Education Goals: Education classes will be provided on a variety of topics geared toward better understanding of heart health and risk factor modification. Participant will state understanding/return demonstration of topics presented as noted by education test scores.  Learning Barriers/Preferences:  Learning Barriers/Preferences - 07/04/23 1341       Learning Barriers/Preferences   Learning Barriers None    Learning Preferences Individual Instruction             General Cardiac Education Topics:  AED/CPR: - Group verbal and written instruction with the use of models to demonstrate the basic use of the AED with the basic ABC's of resuscitation.   Anatomy and Cardiac Procedures: - Group verbal and visual presentation and models provide information about basic cardiac anatomy and function. Reviews the testing methods done to diagnose heart disease and the outcomes of the test results. Describes the treatment choices: Medical Management, Angioplasty, or Coronary Bypass Surgery for treating various heart conditions including Myocardial Infarction, Angina, Valve Disease, and Cardiac Arrhythmias.  Written material given at graduation. Flowsheet Row Cardiac Rehab from 07/15/2023 in [facility] Cardiac  and Pulmonary Rehab  Education need identified 07/15/23       Medication Safety: - Group verbal and visual instruction to review commonly prescribed medications for heart and lung disease. Reviews the medication, class of the drug, and side effects. Includes the steps to properly store meds and maintain the prescription regimen.  Written material given at graduation.   Intimacy: - Group verbal instruction through game format to discuss how heart and lung disease can affect sexual intimacy. Written material given at graduation..   Know Your Numbers and Heart Failure: - Group verbal and visual instruction to discuss disease risk factors for cardiac and pulmonary disease and treatment options.  Reviews associated critical values for Overweight/Obesity, Hypertension, Cholesterol, and Diabetes.  Discusses basics of heart failure: signs/symptoms and treatments.  Introduces Heart Failure Zone chart for action plan for heart failure.  Written material given at graduation.   Infection Prevention: - Provides verbal and written material to individual with discussion of infection control including proper hand washing and proper equipment cleaning during exercise session. Flowsheet Row Cardiac Rehab from 07/15/2023 in [facility] Cardiac and Pulmonary Rehab  Date 07/15/23  Educator [facility]  Instruction Review Code 1- Verbalizes Understanding       Falls Prevention: - Provides verbal and written material to individual with discussion of falls prevention and safety. Flowsheet Row Cardiac Rehab from 07/15/2023 in [facility] Cardiac and Pulmonary Rehab  Date 07/15/23  Educator [facility]  Instruction Review Code 1- Verbalizes Understanding       Other: -Provides group and verbal instruction on various topics (see comments)   Knowledge Questionnaire Score:  Knowledge Questionnaire Score - 07/15/23 1527       Knowledge  Questionnaire Score   Pre Score 22/26             Core Components/Risk Factors/Patient  Goals at Admission:  Personal Goals and Risk Factors at Admission - 07/04/23 1340       Core Components/Risk Factors/Patient Goals on Admission   Heart Failure Yes    Intervention Provide a combined exercise and nutrition program that is supplemented with education, support and counseling about heart failure. Directed toward relieving symptoms such as shortness of breath, decreased exercise tolerance, and extremity edema.    Expected Outcomes Improve functional capacity of life;Short term: Attendance in program 2-3 days a week with increased exercise capacity. Reported lower sodium intake. Reported increased fruit and vegetable intake. Reports medication compliance.;Long term: Adoption of self-care skills and reduction of barriers for early signs and symptoms recognition and intervention leading to self-care maintenance.;Short term: Daily weights obtained and reported for increase. Utilizing diuretic protocols set by physician.    Hypertension Yes    Intervention Provide education on lifestyle modifcations including regular physical activity/exercise, weight management, moderate sodium restriction and increased consumption of fresh fruit, vegetables, and low fat dairy, alcohol moderation, and smoking cessation.;Monitor prescription use compliance.    Expected Outcomes Short Term: Continued assessment and intervention until BP is < 140/70mm HG in hypertensive participants. < 130/23mm HG in hypertensive participants with diabetes, heart failure or chronic kidney disease.;Long Term: Maintenance of blood pressure at goal levels.    Lipids Yes    Intervention Provide education and support for participant on nutrition & aerobic/resistive exercise along with prescribed medications to achieve LDL 70mg , HDL >40mg .    Expected Outcomes Short Term: Participant states understanding of desired cholesterol values and is compliant with medications prescribed. Participant is following exercise prescription and  nutrition guidelines.;Long Term: Cholesterol controlled with medications as prescribed, with individualized exercise RX and with personalized nutrition plan. Value goals: LDL < 70mg , HDL > 40 mg.             Education:Diabetes - Individual verbal and written instruction to review signs/symptoms of diabetes, desired ranges of glucose level fasting, after meals and with exercise. Acknowledge that pre and post exercise glucose checks will be done for 3 sessions at entry of program.   Core Components/Risk Factors/Patient Goals Review:    Core Components/Risk Factors/Patient Goals at Discharge (Final Review):    ITP Comments:  ITP Comments     Row Name 07/04/23 1344 07/15/23 1549 07/17/23 1429 08/07/23 0923 09/04/23 0947   ITP Comments Initial phone call completed. Diagnosis can be found in [facility] 7[facility]13. EP Orientation scheduled for Monday 8/12 at 10:30. Completed and gym orientation. Initial ITP created and sent for review to Dr. Bethann Punches, Medical Director. First full day of exercise!  Patient was oriented to gym and equipment including functions, settings, policies, and procedures.  Patient's individual exercise prescription and treatment plan were reviewed.  All starting workloads were established based on the results of the 6 minute walk test done at initial orientation visit.  The plan for exercise progression was also introduced and progression will be customized based on patient's performance and goals. 30 Day review completed. Medical Director ITP review done, changes made as directed, and signed approval by Medical Director.     new to program 30 Day review completed. Medical Director ITP review done, changes made as directed, and signed approval by Medical Director.            Comments:

## 2023-09-04 NOTE — Progress Notes (Signed)
Daily Session Note  Patient Details  Name: [name]. MRN: [id number] Date of Birth: [birth year] Referring Provider:    Encounter Date: 09/04/2023  Check In:  Session Check In - 09/04/23 1422       Check-In   Supervising physician immediately available to respond to emergencies See telemetry face sheet for immediately available ER MD    Location [facility]-Cardiac & Pulmonary Rehab    Staff Present Ronette Deter, BS, Exercise Physiologist;Maxon Conetta BS, , Exercise Physiologist;Dmiyah Liscano Katrinka Blazing, RN, ADN    Virtual Visit No    Medication changes reported     No    Fall or balance concerns reported    No    Warm-up and Cool-down Performed on first and last piece of equipment    Resistance Training Performed Yes    VAD Patient? No    PAD/SET Patient? No      Pain Assessment   Currently in Pain? No/denies                Social History   Tobacco Use  Smoking Status Never  Smokeless Tobacco Never    Goals Met:  Independence with exercise equipment Exercise tolerated well No report of concerns or symptoms today Strength training completed today  Goals Unmet:  Not Applicable  Comments: Pt able to follow exercise prescription today without complaint.  Will continue to monitor for progression.    Dr. Bethann Punches is Medical Director for [facility] Cardiac Rehabilitation.  Dr. Vida Rigger is Medical Director for [facility] Pulmonary Rehabilitation.

## 2023-09-11 ENCOUNTER — Encounter: Payer: Medicare Other | Admitting: *Deleted

## 2023-09-11 DIAGNOSIS — I252 Old myocardial infarction: Secondary | ICD-10-CM | POA: Diagnosis not present

## 2023-09-11 DIAGNOSIS — I214 Non-ST elevation (NSTEMI) myocardial infarction: Secondary | ICD-10-CM

## 2023-09-11 NOTE — Progress Notes (Signed)
Daily Session Note  Patient Details  Name: [name]. MRN: [id number] Date of Birth: [birth year] Referring Provider:    Encounter Date: 09/11/2023  Check In:  Session Check In - 09/11/23 1353       Check-In   Supervising physician immediately available to respond to emergencies See telemetry face sheet for immediately available ER MD    Location [facility]-Cardiac & Pulmonary Rehab    Staff Present Maxon Conetta BS, , Exercise Physiologist;Kelly Madilyn Fireman, BS, ACSM CEP, Exercise Physiologist;Kayelyn Lemon Jewel Baize, RN BSN;Noah Tickle, BS, Exercise Physiologist    Virtual Visit No    Medication changes reported     No    Fall or balance concerns reported    No    Warm-up and Cool-down Performed on first and last piece of equipment    Resistance Training Performed Yes    VAD Patient? No    PAD/SET Patient? No      Pain Assessment   Currently in Pain? No/denies                Social History   Tobacco Use  Smoking Status Never  Smokeless Tobacco Never    Goals Met:  Independence with exercise equipment Exercise tolerated well No report of concerns or symptoms today Strength training completed today  Goals Unmet:  Not Applicable  Comments: Pt able to follow exercise prescription today without complaint.  Will continue to monitor for progression.    Dr. Bethann Punches is Medical Director for [facility] Cardiac Rehabilitation.  Dr. Vida Rigger is Medical Director for [facility] Pulmonary Rehabilitation.

## 2023-09-12 ENCOUNTER — Ambulatory Visit: Payer: Medicare Other

## 2023-09-12 ENCOUNTER — Encounter: Payer: Medicare Other | Admitting: *Deleted

## 2023-09-12 DIAGNOSIS — I252 Old myocardial infarction: Secondary | ICD-10-CM | POA: Diagnosis not present

## 2023-09-12 DIAGNOSIS — I214 Non-ST elevation (NSTEMI) myocardial infarction: Secondary | ICD-10-CM

## 2023-09-12 NOTE — Progress Notes (Signed)
Daily Session Note  Patient Details  Name: [name]. MRN: [id number] Date of Birth: [birth year] Referring Provider:    Encounter Date: 09/12/2023  Check In:  Session Check In - 09/12/23 1341       Check-In   Supervising physician immediately available to respond to emergencies See telemetry face sheet for immediately available ER MD    Location [facility]-Cardiac & Pulmonary Rehab    Staff Present Elige Ko, RCP,RRT,BSRT;Maxon Conetta BS, , Exercise Physiologist;Jenny [name] Katrinka Blazing, RN, ADN    Virtual Visit No    Medication changes reported     No    Fall or balance concerns reported    No    Warm-up and Cool-down Performed on first and last piece of equipment    Resistance Training Performed Yes    VAD Patient? No    PAD/SET Patient? No      Pain Assessment   Currently in Pain? No/denies                Social History   Tobacco Use  Smoking Status Never  Smokeless Tobacco Never    Goals Met:  Independence with exercise equipment Exercise tolerated well No report of concerns or symptoms today Strength training completed today  Goals Unmet:  Not Applicable  Comments: Pt able to follow exercise prescription today without complaint.  Will continue to monitor for progression.    Dr. Bethann Punches is Medical Director for [facility] Cardiac Rehabilitation.  Dr. Vida Rigger is Medical Director for [facility] Pulmonary Rehabilitation.

## 2023-09-16 ENCOUNTER — Encounter: Payer: Medicare Other | Admitting: *Deleted

## 2023-09-16 DIAGNOSIS — I252 Old myocardial infarction: Secondary | ICD-10-CM | POA: Diagnosis not present

## 2023-09-16 DIAGNOSIS — I214 Non-ST elevation (NSTEMI) myocardial infarction: Secondary | ICD-10-CM

## 2023-09-16 NOTE — Progress Notes (Signed)
Daily Session Note  Patient Details  Name: [name]. MRN: [id number] Date of Birth: [birth year] Referring Provider:    Encounter Date: 09/16/2023  Check In:  Session Check In - 09/16/23 1436       Check-In   Supervising physician immediately available to respond to emergencies See telemetry face sheet for immediately available ER MD    Location [facility]-Cardiac & Pulmonary Rehab    Staff Present Maxon Suzzette Righter, , Exercise Physiologist;Darneisha Windhorst Jewel Baize, RN BSN;Joseph Hood, RCP,RRT,BSRT;Noah [place], BS, Exercise Physiologist    Virtual Visit No    Medication changes reported     No    Fall or balance concerns reported    No    Warm-up and Cool-down Performed on first and last piece of equipment    Resistance Training Performed Yes    VAD Patient? No    PAD/SET Patient? No      Pain Assessment   Currently in Pain? No/denies                Social History   Tobacco Use  Smoking Status Never  Smokeless Tobacco Never    Goals Met:  Independence with exercise equipment Exercise tolerated well No report of concerns or symptoms today Strength training completed today  Goals Unmet:  Not Applicable  Comments: Pt able to follow exercise prescription today without complaint.  Will continue to monitor for progression.    Dr. Bethann Punches is Medical Director for [facility] Cardiac Rehabilitation.  Dr. Vida Rigger is Medical Director for [facility] Pulmonary Rehabilitation.

## 2023-09-18 ENCOUNTER — Encounter: Payer: Medicare Other | Admitting: *Deleted

## 2023-09-18 DIAGNOSIS — I252 Old myocardial infarction: Secondary | ICD-10-CM | POA: Diagnosis not present

## 2023-09-18 DIAGNOSIS — I214 Non-ST elevation (NSTEMI) myocardial infarction: Secondary | ICD-10-CM

## 2023-09-18 NOTE — Progress Notes (Signed)
Daily Session Note  Patient Details  Name: [name]. MRN: [id number] Date of Birth: Aug 30, 1933 Referring Provider:    Encounter Date: 09/18/2023  Check In:  Session Check In - 09/18/23 1339       Check-In   Supervising physician immediately available to respond to emergencies See telemetry face sheet for immediately available ER MD    Location [facility]-Cardiac & Pulmonary Rehab    Staff Present Susann Givens, RN Atilano Median, RN, Franki Monte, BS, ACSM CEP, Exercise Physiologist;Noah Tickle, BS, Exercise Physiologist    Virtual Visit No    Medication changes reported     No    Fall or balance concerns reported    No    Warm-up and Cool-down Performed on first and last piece of equipment    Resistance Training Performed Yes    VAD Patient? No    PAD/SET Patient? No      Pain Assessment   Currently in Pain? No/denies                Social History   Tobacco Use  Smoking Status Never  Smokeless Tobacco Never    Goals Met:  Independence with exercise equipment Exercise tolerated well No report of concerns or symptoms today Strength training completed today  Goals Unmet:  Not Applicable  Comments: Pt able to follow exercise prescription today without complaint.  Will continue to monitor for progression.    Dr. Bethann Punches is Medical Director for [facility] Cardiac Rehabilitation.  Dr. Vida Rigger is Medical Director for [facility] Pulmonary Rehabilitation.

## 2023-09-19 ENCOUNTER — Encounter: Payer: Medicare Other | Admitting: *Deleted

## 2023-09-19 DIAGNOSIS — I214 Non-ST elevation (NSTEMI) myocardial infarction: Secondary | ICD-10-CM

## 2023-09-19 DIAGNOSIS — I252 Old myocardial infarction: Secondary | ICD-10-CM | POA: Diagnosis not present

## 2023-09-19 NOTE — Progress Notes (Signed)
Daily Session Note  Patient Details  Name: [name]. MRN: [id number] Date of Birth: Mar 06, 1933 Referring Provider:    Encounter Date: 09/19/2023  Check In:  Session Check In - 09/19/23 1342       Check-In   Supervising physician immediately available to respond to emergencies See telemetry face sheet for immediately available ER MD    Location [facility]-Cardiac & Pulmonary Rehab    Staff Present Susann Givens, RN BSN;Joseph Reino [place], [place]    Virtual Visit No    Medication changes reported     No    Fall or balance concerns reported    No    Warm-up and Cool-down Performed on first and last piece of equipment    Resistance Training Performed Yes    VAD Patient? No    PAD/SET Patient? No      Pain Assessment   Currently in Pain? No/denies                Social History   Tobacco Use  Smoking Status Never  Smokeless Tobacco Never    Goals Met:  Independence with exercise equipment Exercise tolerated well No report of concerns or symptoms today Strength training completed today  Goals Unmet:  Not Applicable  Comments: Pt able to follow exercise prescription today without complaint.  Will continue to monitor for progression.    Dr. Bethann Punches is Medical Director for [facility] Cardiac Rehabilitation.  Dr. Vida Rigger is Medical Director for [facility] Pulmonary Rehabilitation.

## 2023-09-23 ENCOUNTER — Ambulatory Visit: Payer: Medicare Other

## 2023-09-25 ENCOUNTER — Encounter: Payer: Medicare Other | Admitting: *Deleted

## 2023-09-25 ENCOUNTER — Ambulatory Visit: Payer: Medicare Other

## 2023-09-25 DIAGNOSIS — I252 Old myocardial infarction: Secondary | ICD-10-CM | POA: Diagnosis not present

## 2023-09-25 DIAGNOSIS — I214 Non-ST elevation (NSTEMI) myocardial infarction: Secondary | ICD-10-CM

## 2023-09-25 NOTE — Progress Notes (Signed)
Daily Session Note  Patient Details  Name: [name]. MRN: [id number] Date of Birth: [birth year] Referring Provider:    Encounter Date: 09/25/2023  Check In:  Session Check In - 09/25/23 1359       Check-In   Supervising physician immediately available to respond to emergencies See telemetry face sheet for immediately available ER MD    Location [facility]-Cardiac & Pulmonary Rehab    Staff Present Tommye Standard, BS, ACSM CEP, Exercise Physiologist;Megan Katrinka Blazing, RN, ADN;Efton Thomley Jewel Baize, RN BSN;Noah Tickle, BS, Exercise Physiologist    Virtual Visit No    Medication changes reported     No    Fall or balance concerns reported    No    Warm-up and Cool-down Performed on first and last piece of equipment    Resistance Training Performed Yes    VAD Patient? No    PAD/SET Patient? No      Pain Assessment   Currently in Pain? No/denies                Social History   Tobacco Use  Smoking Status Never  Smokeless Tobacco Never    Goals Met:  Independence with exercise equipment Exercise tolerated well No report of concerns or symptoms today Strength training completed today  Goals Unmet:  Not Applicable  Comments: Pt able to follow exercise prescription today without complaint.  Will continue to monitor for progression.    Dr. Bethann Punches is Medical Director for [facility] Cardiac Rehabilitation.  Dr. Vida Rigger is Medical Director for [facility] Pulmonary Rehabilitation.

## 2023-09-26 ENCOUNTER — Ambulatory Visit (INDEPENDENT_AMBULATORY_CARE_PROVIDER_SITE_OTHER): Payer: Medicare Other | Admitting: Podiatry

## 2023-09-26 ENCOUNTER — Encounter: Payer: Self-pay | Admitting: Podiatry

## 2023-09-26 ENCOUNTER — Encounter: Payer: Medicare Other | Admitting: *Deleted

## 2023-09-26 ENCOUNTER — Ambulatory Visit: Payer: Medicare Other

## 2023-09-26 DIAGNOSIS — L84 Corns and callosities: Secondary | ICD-10-CM

## 2023-09-26 DIAGNOSIS — G629 Polyneuropathy, unspecified: Secondary | ICD-10-CM

## 2023-09-26 DIAGNOSIS — M79676 Pain in unspecified toe(s): Secondary | ICD-10-CM

## 2023-09-26 DIAGNOSIS — B351 Tinea unguium: Secondary | ICD-10-CM

## 2023-09-26 DIAGNOSIS — I252 Old myocardial infarction: Secondary | ICD-10-CM | POA: Diagnosis not present

## 2023-09-26 DIAGNOSIS — I214 Non-ST elevation (NSTEMI) myocardial infarction: Secondary | ICD-10-CM

## 2023-09-26 NOTE — Progress Notes (Signed)
Daily Session Note  Patient Details  Name: [name]. MRN: [id number] Date of Birth: 1932-12-05 Referring Provider:    Encounter Date: 09/26/2023  Check In:  Session Check In - 09/26/23 1348       Check-In   Supervising physician immediately available to respond to emergencies See telemetry face sheet for immediately available ER MD    Location [facility]-Cardiac & Pulmonary Rehab    Staff Present Ronette Deter, BS, Exercise Physiologist;Meredith Jewel Baize, RN Atilano Median, RN, ADN    Virtual Visit No    Medication changes reported     No    Fall or balance concerns reported    No    Warm-up and Cool-down Performed on first and last piece of equipment    Resistance Training Performed Yes    VAD Patient? No    PAD/SET Patient? No      Pain Assessment   Currently in Pain? No/denies                Social History   Tobacco Use  Smoking Status Never  Smokeless Tobacco Never    Goals Met:  Independence with exercise equipment Exercise tolerated well No report of concerns or symptoms today Strength training completed today  Goals Unmet:  Not Applicable  Comments: Pt able to follow exercise prescription today without complaint.  Will continue to monitor for progression.    Dr. Bethann Punches is Medical Director for [facility] Cardiac Rehabilitation.  Dr. Vida Rigger is Medical Director for [facility] Pulmonary Rehabilitation.

## 2023-09-30 ENCOUNTER — Ambulatory Visit: Payer: Medicare Other

## 2023-09-30 ENCOUNTER — Encounter: Payer: Medicare Other | Admitting: *Deleted

## 2023-09-30 DIAGNOSIS — I252 Old myocardial infarction: Secondary | ICD-10-CM | POA: Diagnosis not present

## 2023-09-30 DIAGNOSIS — I214 Non-ST elevation (NSTEMI) myocardial infarction: Secondary | ICD-10-CM

## 2023-09-30 NOTE — Progress Notes (Signed)
Daily Session Note  Patient Details  Name: [name]. MRN: [id number] Date of Birth: 11-02-1933 Referring Provider:    Encounter Date: 09/30/2023  Check In:  Session Check In - 09/30/23 1344       Check-In   Supervising physician immediately available to respond to emergencies See telemetry face sheet for immediately available ER MD    Location [facility]-Cardiac & Pulmonary Rehab    Staff Present Maxon Conetta BS, , Exercise Physiologist;Montasia Chisenhall Jewel Baize, RN BSN;Noah Tickle, BS, Exercise Physiologist;Joseph [place], [place]    Virtual Visit No    Medication changes reported     No    Fall or balance concerns reported    No    Warm-up and Cool-down Performed on first and last piece of equipment    Resistance Training Performed Yes    VAD Patient? No    PAD/SET Patient? No      Pain Assessment   Currently in Pain? No/denies                Social History   Tobacco Use  Smoking Status Never  Smokeless Tobacco Never    Goals Met:  Independence with exercise equipment Exercise tolerated well No report of concerns or symptoms today Strength training completed today  Goals Unmet:  Not Applicable  Comments: Pt able to follow exercise prescription today without complaint.  Will continue to monitor for progression.  Reviewed home exercise with pt today.  Pt plans to walk or use home exercise equipment for exercise.  Reviewed THR, pulse, RPE, sign and symptoms, pulse oximetery and when to call 911 or MD.  Also discussed weather considerations and indoor options.  Pt voiced understanding.     Dr. Bethann Punches is Medical Director for [facility] Cardiac Rehabilitation.  Dr. Vida Rigger is Medical Director for [facility] Pulmonary Rehabilitation.

## 2023-10-02 ENCOUNTER — Encounter: Payer: Medicare Other | Admitting: *Deleted

## 2023-10-02 ENCOUNTER — Encounter: Payer: Self-pay | Admitting: *Deleted

## 2023-10-02 DIAGNOSIS — I214 Non-ST elevation (NSTEMI) myocardial infarction: Secondary | ICD-10-CM

## 2023-10-02 DIAGNOSIS — I252 Old myocardial infarction: Secondary | ICD-10-CM | POA: Diagnosis not present

## 2023-10-02 NOTE — Progress Notes (Signed)
Cardiac Individual Treatment Plan  Patient Details  Name: [name]. MRN: [id number] Date of Birth: [birth year] Referring Provider:    Initial Encounter Date:   Visit Diagnosis: NSTEMI (non-ST elevated myocardial infarction) (HCC)  Patient's Home Medications on Admission:  Current Outpatient Medications:    acetaminophen (TYLENOL) 650 MG CR tablet, Take 650 mg by mouth every 8 (eight) hours as needed. , Disp: , Rfl:    apixaban (ELIQUIS) 2.5 MG TABS tablet, Take 1 tablet (2.5 mg total) by mouth 2 (two) times daily., Disp: 180 tablet, Rfl: 3   Ascorbic Acid (VITAMIN C) 1000 MG tablet, Take 1,000 mg by mouth daily., Disp: , Rfl:    atorvastatin (LIPITOR) 20 MG tablet, Take 20 mg by mouth at bedtime., Disp: , Rfl:    b complex vitamins tablet, Take 1 tablet by mouth daily., Disp: , Rfl:    cholecalciferol (VITAMIN D) 1000 units tablet, Take 2,000 Units by mouth daily., Disp: , Rfl:    clindamycin (CLEOCIN) 300 MG capsule, Take 300 mg by mouth every 6 (six) hours. (Patient not taking: Reported on 08/25/2023), Disp: , Rfl:    clopidogrel (PLAVIX) 75 MG tablet, Take 1 tablet (75 mg total) by mouth daily., Disp: 90 tablet, Rfl: 0   cyanocobalamin (VITAMIN B12) 1000 MCG tablet, Take 1,000 mcg by mouth daily., Disp: , Rfl:    docusate sodium (COLACE) 100 MG capsule, Take 100 mg by mouth 2 (two) times daily., Disp: , Rfl:    ezetimibe (ZETIA) 10 MG tablet, Take 1 tablet (10 mg total) by mouth daily., Disp: 90 tablet, Rfl: 3   FLAXSEED, LINSEED, PO, Take 2 g by mouth daily., Disp: , Rfl:    furosemide (LASIX) 20 MG tablet, Take 1 tablet (20 mg total) by mouth daily. (Patient not taking: Reported on 08/25/2023), Disp: 90 tablet, Rfl: 3   isosorbide mononitrate (IMDUR) 30 MG 24 hr tablet, Take 0.5 tablets (15 mg total) by mouth daily. (Patient not taking: Reported on 08/25/2023), Disp: 90 tablet, Rfl: 3   losartan (COZAAR) 25 MG tablet, Take 25 mg by mouth daily. (Patient not taking:  Reported on 08/25/2023), Disp: , Rfl:    metoprolol succinate (TOPROL-XL) 25 MG 24 hr tablet, Take 1 tablet (25 mg total) by mouth daily., Disp: 30 tablet, Rfl: 11   nitroGLYCERIN (NITROSTAT) 0.4 MG SL tablet, Place 1 tablet (0.4 mg total) under the tongue every 5 (five) minutes x 3 doses as needed for chest pain., Disp: 25 tablet, Rfl: 0   pantoprazole (PROTONIX) 40 MG tablet, Take 1 tablet (40 mg total) by mouth 2 (two) times daily., Disp: 60 tablet, Rfl: 1   potassium chloride (KLOR-CON) 10 MEQ tablet, Take 1 tablet (10 mEq total) by mouth daily., Disp: 90 tablet, Rfl: 3   pregabalin (LYRICA) 100 MG capsule, Take 100 mg by mouth in the morning., Disp: , Rfl:    pregabalin (LYRICA) 150 MG capsule, Take 150 mg by mouth at bedtime., Disp: , Rfl:    Probiotic Product (UP4 PROBIOTICS PO), Take 7.5 mg by mouth., Disp: , Rfl:    ranolazine (RANEXA) 500 MG 12 hr tablet, Take 1 tablet (500 mg total) by mouth 2 (two) times daily., Disp: 180 tablet, Rfl: 3   spironolactone (ALDACTONE) 25 MG tablet, Take by mouth. (Patient not taking: Reported on 08/25/2023), Disp: , Rfl:    tamsulosin (FLOMAX) 0.4 MG CAPS capsule, Take 2 capsules (0.8 mg total) by mouth daily., Disp: 30 capsule, Rfl: 0   zinc sulfate  220 (50 Zn) MG capsule, Take 1 tablet by mouth daily., Disp: , Rfl:   Past Medical History: Past Medical History:  Diagnosis Date   Acute embolic stroke (HCC) 02/01/2017   BPH (benign prostatic hyperplasia)    Bradycardia 11/10/2015   Coronary artery disease    Dizziness 08/21/2012   DYSPEPSIA 07/31/2010   Qualifier: Diagnosis of  By: Janee Morn, RN, Morrie Sheldon     Hyperlipidemia 05/16/2010   Qualifier: Diagnosis of  By: Mariah Milling MD, [name]      Hypertension    HYPERTENSION, BENIGN 05/16/2010   Qualifier: Diagnosis of  By: Mariah Milling MD, [name]     Stenosis of right carotid artery 02/01/2017    Tobacco Use: Social History   Tobacco Use  Smoking Status Never  Smokeless Tobacco Never    Labs: Review Flowsheet        Latest Ref Rng & Units 01/23/2010 06/15/2023 06/16/2023  Labs for ITP Cardiac and Pulmonary Rehab  Cholestrol 0 - 200 mg/dL 914  - 90   LDL (calc) 0 - 99 mg/dL 78.2  - 44   HDL-C >95 mg/dL 62.1  - 40   Trlycerides <150 mg/dL 93  - 30   Hemoglobin H0Q 4.8 - 5.6 % 5.4  6.3  6.3  -    Details       Multiple values from one day are sorted in reverse-chronological order          Exercise Target Goals: Exercise Program Goal: Individual exercise prescription set using results from initial 6 min walk test and THRR while considering  patient's activity barriers and safety.   Exercise Prescription Goal: Initial exercise prescription builds to 30-45 minutes a day of aerobic activity, 2-3 days per week.  Home exercise guidelines will be given to patient during program as part of exercise prescription that the participant will acknowledge.   Education: Aerobic Exercise: - Group verbal and visual presentation on the components of exercise prescription. Introduces F.I.T.T principle from ACSM for exercise prescriptions.  Reviews F.I.T.T. principles of aerobic exercise including progression. Written material given at graduation. Flowsheet Row Cardiac Rehab from 07/15/2023 in [facility] Cardiac and Pulmonary Rehab  Education need identified 07/15/23       Education: Resistance Exercise: - Group verbal and visual presentation on the components of exercise prescription. Introduces F.I.T.T principle from ACSM for exercise prescriptions  Reviews F.I.T.T. principles of resistance exercise including progression. Written material given at graduation.    Education: Exercise & Equipment Safety: - Individual verbal instruction and demonstration of equipment use and safety with use of the equipment. Flowsheet Row Cardiac Rehab from 07/15/2023 in [facility] Cardiac and Pulmonary Rehab  Date 07/15/23  Educator [facility]  Instruction Review Code 1- Verbalizes Understanding       Education: Exercise Physiology & General  Exercise Guidelines: - Group verbal and written instruction with models to review the exercise physiology of the cardiovascular system and associated critical values. Provides general exercise guidelines with specific guidelines to those with heart or lung disease.    Education: Flexibility, Balance, Mind/Body Relaxation: - Group verbal and visual presentation with interactive activity on the components of exercise prescription. Introduces F.I.T.T principle from ACSM for exercise prescriptions. Reviews F.I.T.T. principles of flexibility and balance exercise training including progression. Also discusses the mind body connection.  Reviews various relaxation techniques to help reduce and manage stress (i.e. Deep breathing, progressive muscle relaxation, and visualization). Balance handout provided to take home. Written material given at graduation.   Activity Barriers & Risk Stratification:  Activity Barriers & Cardiac Risk Stratification - 07/15/23 1443       Activity Barriers & Cardiac Risk Stratification   Activity Barriers Arthritis;Back Problems;Balance Concerns;History of Falls;Muscular Weakness;Other (comment)    Comments Tremors in hands, neuropathy in feet    Cardiac Risk Stratification High             6 Minute Walk:  6 Minute Walk     Row Name 07/15/23 1438         6 Minute Walk   Phase Initial     Distance 780 feet     Walk Time 6 minutes     # of Rest Breaks 0     MPH 1.5     METS 1.03     RPE 13     Perceived Dyspnea  1     VO2 Peak 3.6     Symptoms No     Resting HR 83 bpm     Resting BP 100/56     Resting Oxygen Saturation  97 %     Exercise Oxygen Saturation  during 6 min walk 100 %     Max Ex. HR 93 bpm     Max Ex. BP 120/60     2 Minute Post BP 120/62              Oxygen Initial Assessment:   Oxygen Re-Evaluation:   Oxygen Discharge (Final Oxygen Re-Evaluation):   Initial Exercise Prescription:  Initial Exercise Prescription - 07/15/23  1500       Recumbant Bike   RPM 50    Watts 15    Minutes 15    METs 1.03      NuStep   Level 1    SPM 80    Minutes 15    METs 1.03      Track   Laps 10    Minutes 15    METs 1.54      Prescription Details   Frequency (times per week) 3    Duration Progress to 30 minutes of continuous aerobic without signs/symptoms of physical distress      Intensity   THRR 40-80% of Max Heartrate 103-123    Ratings of Perceived Exertion 11-13    Perceived Dyspnea 0-4      Progression   Progression Continue to progress workloads to maintain intensity without signs/symptoms of physical distress.      Resistance Training   Training Prescription Yes    Weight 3lb    Reps 10-15             Perform Capillary Blood Glucose checks as needed.  Exercise Prescription Changes:   Exercise Prescription Changes     Row Name 07/15/23 1500 07/23/23 1100 08/07/23 1100 08/19/23 1100 09/05/23 1400     Response to Exercise   Blood Pressure (Admit) 100/56 122/68 110/58 102/58 120/58   Blood Pressure (Exercise) 120/60 128/70 124/60 126/62 --   Blood Pressure (Exit) 120/62 122/52 114/62 102/60 116/60   Heart Rate (Admit) 83 bpm 82 bpm 73 bpm 94 bpm 57 bpm   Heart Rate (Exercise) 93 bpm 101 bpm 101 bpm 98 bpm 97 bpm   Heart Rate (Exit) 71 bpm 84 bpm 82 bpm 80 bpm 84 bpm   Oxygen Saturation (Admit) 97 % -- -- -- --   Oxygen Saturation (Exercise) 100 % -- -- -- --   Rating of Perceived Exertion (Exercise) 13 13 13 14 15    Perceived Dyspnea (Exercise) 1 -- --  0 0   Symptoms None -- none none none   Comments results -- -- -- --   Duration -- Continue with 30 min of aerobic exercise without signs/symptoms of physical distress. Continue with 30 min of aerobic exercise without signs/symptoms of physical distress. Continue with 30 min of aerobic exercise without signs/symptoms of physical distress. Continue with 30 min of aerobic exercise without signs/symptoms of physical distress.   Intensity  -- THRR unchanged THRR unchanged THRR unchanged THRR unchanged     Progression   Progression -- Continue to progress workloads to maintain intensity without signs/symptoms of physical distress. Continue to progress workloads to maintain intensity without signs/symptoms of physical distress. Continue to progress workloads to maintain intensity without signs/symptoms of physical distress. Continue to progress workloads to maintain intensity without signs/symptoms of physical distress.   Average METs -- 1.93 1.69 1.8 1.8     Resistance Training   Training Prescription -- Yes Yes Yes Yes   Weight -- 3lb 3lb 3lb 2lb   Reps -- 10-15 10-15 10-15 10-15     Interval Training   Interval Training -- -- No No No     NuStep   Level -- 1 1 2  --   Minutes -- 30 15 15  --   METs -- 1.5 1.6 -- --     T5 Nustep   Level -- 1 1  T6 nustep 2  T6 2   SPM -- 80 -- -- --   Minutes -- 30 15 15 15    METs -- 2.3 2.2 1.5 1.7     Biostep-RELP   Level -- -- 1 -- 1   Minutes -- -- 15 -- 15   METs -- -- 2 -- 2     Track   Laps -- -- 13  Hallway 19 20   Minutes -- -- 15 15 15    METs -- -- 1.55 2.03 2.09     Oxygen   Maintain Oxygen Saturation -- -- 88% or higher 88% or higher 88% or higher    Row Name 09/19/23 1500 09/30/23 1400           Response to Exercise   Blood Pressure (Admit) 104/62 --      Blood Pressure (Exit) 124/60 --      Heart Rate (Admit) 68 bpm --      Heart Rate (Exercise) 98 bpm --      Heart Rate (Exit) 76 bpm --      Rating of Perceived Exertion (Exercise) 15 --      Perceived Dyspnea (Exercise) 0 --      Symptoms none --      Duration Continue with 30 min of aerobic exercise without signs/symptoms of physical distress. Continue with 30 min of aerobic exercise without signs/symptoms of physical distress.      Intensity THRR unchanged THRR unchanged        Progression   Progression Continue to progress workloads to maintain intensity without signs/symptoms of physical  distress. Continue to progress workloads to maintain intensity without signs/symptoms of physical distress.      Average METs 1.9 1.9        Resistance Training   Training Prescription Yes Yes      Weight 2lb 2lb      Reps 10-15 10-15        Interval Training   Interval Training No No        NuStep   Level 4 4  Minutes 15 15      METs 1.9 1.9        T5 Nustep   Level 2  T6 2  T6      Minutes 15 15      METs 1.6 1.6        Track   Laps 23 23      Minutes 15 15      METs 2.25 2.25        Home Exercise Plan   Plans to continue exercise at -- Home (comment)  walk or use exercise equipment      Frequency -- Add 1 additional day to program exercise sessions.      Initial Home Exercises Provided -- 09/30/23        Oxygen   Maintain Oxygen Saturation 88% or higher 88% or higher               Exercise Comments:   Exercise Comments     Row Name 07/17/23 1430           Exercise Comments First full day of exercise!  Patient was oriented to gym and equipment including functions, settings, policies, and procedures.  Patient's individual exercise prescription and treatment plan were reviewed.  All starting workloads were established based on the results of the 6 minute walk test done at initial orientation visit.  The plan for exercise progression was also introduced and progression will be customized based on patient's performance and goals.                Exercise Goals and Review:   Exercise Goals     Row Name 07/15/23 1515             Exercise Goals   Increase Physical Activity Yes       Expected Outcomes Short Term: Attend rehab on a regular basis to increase amount of physical activity.;Long Term: Add in home exercise to make exercise part of routine and to increase amount of physical activity.;Long Term: Exercising regularly at least 3-5 days a week.       Increase Strength and Stamina Yes       Expected Outcomes Short Term: Increase workloads from  initial exercise prescription for resistance, speed, and METs.;Short Term: Perform resistance training exercises routinely during rehab and add in resistance training at home;Long Term: Improve cardiorespiratory fitness, muscular endurance and strength as measured by increased METs and functional capacity ( )       Able to understand and use rate of perceived exertion (RPE) scale Yes       Intervention Provide education and explanation on how to use RPE scale       Expected Outcomes Short Term: Able to use RPE daily in rehab to express subjective intensity level;Long Term:  Able to use RPE to guide intensity level when exercising independently       Able to understand and use Dyspnea scale Yes       Intervention Provide education and explanation on how to use Dyspnea scale       Expected Outcomes Short Term: Able to use Dyspnea scale daily in rehab to express subjective sense of shortness of breath during exertion;Long Term: Able to use Dyspnea scale to guide intensity level when exercising independently       Knowledge and understanding of Target Heart Rate Range (THRR) Yes       Intervention Provide education and explanation of THRR including how the numbers were predicted and where they  are located for reference       Expected Outcomes Short Term: Able to state/look up THRR;Short Term: Able to use daily as guideline for intensity in rehab;Long Term: Able to use THRR to govern intensity when exercising independently       Able to check pulse independently Yes       Intervention Provide education and demonstration on how to check pulse in carotid and radial arteries.;Review the importance of being able to check your own pulse for safety during independent exercise       Expected Outcomes Short Term: Able to explain why pulse checking is important during independent exercise;Long Term: Able to check pulse independently and accurately       Understanding of Exercise Prescription Yes        Intervention Provide education, explanation, and written materials on patient's individual exercise prescription       Expected Outcomes Short Term: Able to explain program exercise prescription;Long Term: Able to explain home exercise prescription to exercise independently                Exercise Goals Re-Evaluation :  Exercise Goals Re-Evaluation     Row Name 07/17/23 1430 07/23/23 1156 08/07/23 1121 08/19/23 1139 09/05/23 1454     Exercise Goal Re-Evaluation   Exercise Goals Review Increase Physical Activity;Able to understand and use rate of perceived exertion (RPE) scale;Knowledge and understanding of Target Heart Rate Range (THRR);Understanding of Exercise Prescription;Increase Strength and Stamina;Able to check pulse independently Increase Physical Activity;Understanding of Exercise Prescription;Increase Strength and Stamina Increase Physical Activity;Understanding of Exercise Prescription;Increase Strength and Stamina Increase Physical Activity;Understanding of Exercise Prescription;Increase Strength and Stamina Increase Physical Activity;Understanding of Exercise Prescription;Increase Strength and Stamina   Comments Reviewed RPE  and dyspnea scale, THR and program prescription with pt today.  Pt voiced understanding and was given a copy of goals to take home. [place] is off to a good start in the program. He has done the nustep for the entire duration of the aerobic session. We will continue to monitor his progress in the program. Jaceion is doing well in rehab. He has continued to work at level 1 on the T4 nustep, T6 nustep, and biostep. He also began walking and was able to walk 13 laps in the hallway. We will continue to monitor his progress in the program. Geronimo is doing well in rehab. He recently has increased his T6 nustep, and T4 nustep level from 1 to 2. He also has been able to walk 19 laps on the track. We will continue to monitor his progress in the program. Dhruvin is doing well in  rehab. He was able to increase his track laps from 19 to 20 in 15 minutes. He continues to do well on the T6 nustep at level 2, and has increased his MET value on the T6. We will continue to monitor his progress in the program.   Expected Outcomes Short: Use RPE daily to regulate intensity.  Long: Follow program prescription in THR. Short: Continue to follow current exercise prescription. Try a nustep machine and a another machine for the aerobic portion of rehab. Long: Continue exercise to improve strength and stamina Short: Try level 2 on the T6 nustep. Long: Continue to increase overall METs and stamina. Short: Continue to follow current exercise prescription, and progressively increase workloads. Long: Continue exercise to improve strength and stamina. Short: Continue to follow current exercise prescription, and progressively increase workloads. Long: Continue exercise to improve strength and stamina.  Row Name 09/19/23 1510 09/30/23 1438           Exercise Goal Re-Evaluation   Exercise Goals Review Increase Physical Activity;Understanding of Exercise Prescription;Increase Strength and Stamina Increase Physical Activity;Able to understand and use rate of perceived exertion (RPE) scale;Knowledge and understanding of Target Heart Rate Range (THRR);Understanding of Exercise Prescription;Increase Strength and Stamina;Able to understand and use Dyspnea scale;Able to check pulse independently      Comments Bukhari continues to do make improvements in rehab. He has recently been able to increase his level on the T4 nustep from level 2 to 4. He was also able to increase his track laps from 20 to 23 in 15 minutes. We will continue to monitor his progress in the program. Reviewed home exercise with pt today.  Pt plans to walk or use home exercise equipment for exercise.  Reviewed THR, pulse, RPE, sign and symptoms, pulse oximetery and when to call 911 or MD.  Also discussed weather considerations and indoor  options.  Pt voiced understanding.      Expected Outcomes Short: Continue to follow current exercise prescription, and progressively increase workloads. Long: Continue exercise to improve strength and stamina. Short: add 1 day  of exercise outside of class at home following provided guidelines. Long: become independent with exercise routine.               Discharge Exercise Prescription (Final Exercise Prescription Changes):  Exercise Prescription Changes - 09/30/23 1400       Response to Exercise   Duration Continue with 30 min of aerobic exercise without signs/symptoms of physical distress.    Intensity THRR unchanged      Progression   Progression Continue to progress workloads to maintain intensity without signs/symptoms of physical distress.    Average METs 1.9      Resistance Training   Training Prescription Yes    Weight 2lb    Reps 10-15      Interval Training   Interval Training No      NuStep   Level 4    Minutes 15    METs 1.9      T5 Nustep   Level 2   T6   Minutes 15    METs 1.6      Track   Laps 23    Minutes 15    METs 2.25      Home Exercise Plan   Plans to continue exercise at Home (comment)   walk or use exercise equipment   Frequency Add 1 additional day to program exercise sessions.    Initial Home Exercises Provided 09/30/23      Oxygen   Maintain Oxygen Saturation 88% or higher             Nutrition:  Target Goals: Understanding of nutrition guidelines, daily intake of sodium 1500mg , cholesterol 200mg , calories 30% from fat and 7% or less from saturated fats, daily to have 5 or more servings of fruits and vegetables.  Education: All About Nutrition: -Group instruction provided by verbal, written material, interactive activities, discussions, models, and posters to present general guidelines for heart healthy nutrition including fat, fiber, MyPlate, the role of sodium in heart healthy nutrition, utilization of the nutrition label,  and utilization of this knowledge for meal planning. Follow up email sent as well. Written material given at graduation. Flowsheet Row Cardiac Rehab from 07/15/2023 in [facility] Cardiac and Pulmonary Rehab  Education need identified 07/15/23       Biometrics:  Pre  Biometrics - 07/15/23 1517       Pre Biometrics   Height 5' 8.6" (1.742 m)    Weight 174 lb (78.9 kg)    Waist Circumference 43.5 inches    Hip Circumference 39.2 inches    Waist to Hip Ratio 1.11 %    BMI (Calculated) 26.01    Single Leg Stand 0 seconds              Nutrition Therapy Plan and Nutrition Goals:  Nutrition Therapy & Goals - 07/17/23 1426       Nutrition Therapy   Diet Cardiac    Protein (specify units) >75g    Fiber 30 grams    Whole Grain Foods 3 servings    Saturated Fats 15 max. grams    Fruits and Vegetables 5 servings/day    Sodium 2 grams      Personal Nutrition Goals   Nutrition Goal Eat a protein at every meal    Personal Goal #2 Drink 64oz of water daily    Personal Goal #3 Pair protein with a carb    Comments Patient presents with wife. He is drinking ~64oz of water daily. Doesn't drink anything else other than a cup of black coffee in the morning. Spoke to them about setting goal to get at least 64oz consistently. Reviewed mediterranean diet handout, encouraged whole grains, healthy fats, and colorful produce. Spoke to them about nutrient and calorie density. Warned about filling up on low calorie veggies and not having enough room for protein and complex carbs. Wife took notes and asked questions, was able to teach back clearly. Set goal to eat a protein at every meal and to build more balanced meals. Provided some examples and brainstormed small meals and snacks with foods he likes and will eat with focus on ensuring he meets calorie and protein goals to prevent unintended weight loss.      Intervention Plan   Intervention Prescribe, educate and counsel regarding individualized specific  dietary modifications aiming towards targeted core components such as weight, hypertension, lipid management, diabetes, heart failure and other comorbidities.;Nutrition handout(s) given to patient.    Expected Outcomes Short Term Goal: Understand basic principles of dietary content, such as calories, fat, sodium, cholesterol and nutrients.;Short Term Goal: A plan has been developed with personal nutrition goals set during dietitian appointment.;Long Term Goal: Adherence to prescribed nutrition plan.             Nutrition Assessments:  MEDIFICTS Score Key: >=70 Need to make dietary changes  40-70 Heart Healthy Diet <= 40 Therapeutic Level Cholesterol Diet  Flowsheet Row Cardiac Rehab from 07/15/2023 in [facility] Cardiac and Pulmonary Rehab  Picture Your Plate Total Score on Admission 77      Picture Your Plate Scores: <47 Unhealthy dietary pattern with much room for improvement. 41-50 Dietary pattern unlikely to meet recommendations for good health and room for improvement. 51-60 More healthful dietary pattern, with some room for improvement.  >60 Healthy dietary pattern, although there may be some specific behaviors that could be improved.    Nutrition Goals Re-Evaluation:  Nutrition Goals Re-Evaluation     Row Name 09/19/23 1429             Goals   Comment Rodd states that he is continuing to apply dietary guidelines discussed with RD. He is still trying to eat three meals a day, however, he states he does get busy on some days and get only two. He also continues to work on  drinking more water and consuming more protein. He said his wife is good at helping him with his nutrition.       Expected Outcome Short: Continue to work on drinking more water. Long: Continue to practice heart healthy butrition as discussed with RD.                Nutrition Goals Discharge (Final Nutrition Goals Re-Evaluation):  Nutrition Goals Re-Evaluation - 09/19/23 1429       Goals   Comment  Rochell states that he is continuing to apply dietary guidelines discussed with RD. He is still trying to eat three meals a day, however, he states he does get busy on some days and get only two. He also continues to work on drinking more water and consuming more protein. He said his wife is good at helping him with his nutrition.    Expected Outcome Short: Continue to work on drinking more water. Long: Continue to practice heart healthy butrition as discussed with RD.             Psychosocial: Target Goals: Acknowledge presence or absence of significant depression and/or stress, maximize coping skills, provide positive support system. Participant is able to verbalize types and ability to use techniques and skills needed for reducing stress and depression.   Education: Stress, Anxiety, and Depression - Group verbal and visual presentation to define topics covered.  Reviews how body is impacted by stress, anxiety, and depression.  Also discusses healthy ways to reduce stress and to treat/manage anxiety and depression.  Written material given at graduation. Flowsheet Row Cardiac Rehab from 07/15/2023 in [facility] Cardiac and Pulmonary Rehab  Education need identified 07/15/23       Education: Sleep Hygiene -Provides group verbal and written instruction about how sleep can affect your health.  Define sleep hygiene, discuss sleep cycles and impact of sleep habits. Review good sleep hygiene tips.    Initial Review & Psychosocial Screening:  Initial Psych Review & Screening - 07/04/23 1342       Initial Review   Current issues with Current Stress Concerns    Source of Stress Concerns Unable to participate in former interests or hobbies      Family Dynamics   Good Support System? Yes      Barriers   Psychosocial barriers to participate in program There are no identifiable barriers or psychosocial needs.;The patient should benefit from training in stress management and relaxation.       Screening Interventions   Interventions Encouraged to exercise;Provide feedback about the scores to participant;To provide support and resources with identified psychosocial needs    Expected Outcomes Short Term goal: Utilizing psychosocial counselor, staff and physician to assist with identification of specific Stressors or current issues interfering with healing process. Setting desired goal for each stressor or current issue identified.;Long Term Goal: Stressors or current issues are controlled or eliminated.;Short Term goal: Identification and review with participant of any Quality of Life or Depression concerns found by scoring the questionnaire.;Long Term goal: The participant improves quality of Life and PHQ9 Scores as seen by post scores and/or verbalization of changes             Quality of Life Scores:   Quality of Life - 07/15/23 1526       Quality of Life   Select Quality of Life      Quality of Life Scores   Health/Function Pre 20.14 %    Socioeconomic Pre 30 %    Psych/Spiritual  Pre 30 %    Family Pre 30 %    GLOBAL Pre 25.69 %            Scores of 19 and below usually indicate a poorer quality of life in these areas.  A difference of  2-3 points is a clinically meaningful difference.  A difference of 2-3 points in the total score of the Quality of Life Index has been associated with significant improvement in overall quality of life, self-image, physical symptoms, and general health in studies assessing change in quality of life.  PHQ-9: Review Flowsheet       07/15/2023  Depression screen PHQ 2/9  Decreased Interest 0  Down, Depressed, Hopeless 0  PHQ - 2 Score 0  Altered sleeping 0  Tired, decreased energy 2  Change in appetite 1  Feeling bad or failure about yourself  0  Trouble concentrating 3  Moving slowly or fidgety/restless 0  Suicidal thoughts 0  PHQ-9 Score 6  Difficult doing work/chores Somewhat difficult    Details            Interpretation of Total Score  Total Score Depression Severity:  1-4 = Minimal depression, 5-9 = Mild depression, 10-14 = Moderate depression, 15-19 = Moderately severe depression, 20-27 = Severe depression   Psychosocial Evaluation and Intervention:  Psychosocial Evaluation - 07/04/23 1344       Psychosocial Evaluation & Interventions   Interventions Encouraged to exercise with the program and follow exercise prescription    Comments Mr. Wearing is coming to cardiac rehab after a NSTEMI. He had a stroke 5 years ago that caused a difficulty in word processing, so his wife mainly spoke on the phone during the orientation phone call. His main motivation for coming to the program is to get back to what he had been doing. He has felt weaker since his MI, so he wants to regain his strength back. His wife and he are worried about him falling because of his weakness, so he is ready to work on his stamina as well. He has a good support system. He reports no sleep concerns or concerns with anxiety or depression.    Expected Outcomes Short: attend cardiac rehab for education and exercise. Long: devleop and maintain positive self care habits    Continue Psychosocial Services  Follow up required by staff             Psychosocial Re-Evaluation:  Psychosocial Re-Evaluation     Row Name 09/19/23 1427             Psychosocial Re-Evaluation   Current issues with None Identified       Comments Riel reports no current stressors at this time. He does state that he stays busy with appointments but is doing well overall. He states that he has "no trouble sleeping." His wife and family are a good support system for him.       Expected Outcomes Short: Continue to attend cardiac rehab for mental boost. Long: Continue to maintain positive outlook.       Interventions Encouraged to attend Cardiac Rehabilitation for the exercise       Continue Psychosocial Services  Follow up required by staff                 Psychosocial Discharge (Final Psychosocial Re-Evaluation):  Psychosocial Re-Evaluation - 09/19/23 1427       Psychosocial Re-Evaluation   Current issues with None Identified    Comments Oda reports no current  stressors at this time. He does state that he stays busy with appointments but is doing well overall. He states that he has "no trouble sleeping." His wife and family are a good support system for him.    Expected Outcomes Short: Continue to attend cardiac rehab for mental boost. Long: Continue to maintain positive outlook.    Interventions Encouraged to attend Cardiac Rehabilitation for the exercise    Continue Psychosocial Services  Follow up required by staff             Vocational Rehabilitation: Provide vocational rehab assistance to qualifying candidates.   Vocational Rehab Evaluation & Intervention:  Vocational Rehab - 07/04/23 1342       Initial Vocational Rehab Evaluation & Intervention   Assessment shows need for Vocational Rehabilitation No             Education: Education Goals: Education classes will be provided on a variety of topics geared toward better understanding of heart health and risk factor modification. Participant will state understanding/return demonstration of topics presented as noted by education test scores.  Learning Barriers/Preferences:  Learning Barriers/Preferences - 07/04/23 1341       Learning Barriers/Preferences   Learning Barriers None    Learning Preferences Individual Instruction             General Cardiac Education Topics:  AED/CPR: - Group verbal and written instruction with the use of models to demonstrate the basic use of the AED with the basic ABC's of resuscitation.   Anatomy and Cardiac Procedures: - Group verbal and visual presentation and models provide information about basic cardiac anatomy and function. Reviews the testing methods done to diagnose heart disease and the outcomes of the  test results. Describes the treatment choices: Medical Management, Angioplasty, or Coronary Bypass Surgery for treating various heart conditions including Myocardial Infarction, Angina, Valve Disease, and Cardiac Arrhythmias.  Written material given at graduation. Flowsheet Row Cardiac Rehab from 07/15/2023 in [facility] Cardiac and Pulmonary Rehab  Education need identified 07/15/23       Medication Safety: - Group verbal and visual instruction to review commonly prescribed medications for heart and lung disease. Reviews the medication, class of the drug, and side effects. Includes the steps to properly store meds and maintain the prescription regimen.  Written material given at graduation.   Intimacy: - Group verbal instruction through game format to discuss how heart and lung disease can affect sexual intimacy. Written material given at graduation..   Know Your Numbers and Heart Failure: - Group verbal and visual instruction to discuss disease risk factors for cardiac and pulmonary disease and treatment options.  Reviews associated critical values for Overweight/Obesity, Hypertension, Cholesterol, and Diabetes.  Discusses basics of heart failure: signs/symptoms and treatments.  Introduces Heart Failure Zone chart for action plan for heart failure.  Written material given at graduation.   Infection Prevention: - Provides verbal and written material to individual with discussion of infection control including proper hand washing and proper equipment cleaning during exercise session. Flowsheet Row Cardiac Rehab from 07/15/2023 in [facility] Cardiac and Pulmonary Rehab  Date 07/15/23  Educator [facility]  Instruction Review Code 1- Verbalizes Understanding       Falls Prevention: - Provides verbal and written material to individual with discussion of falls prevention and safety. Flowsheet Row Cardiac Rehab from 07/15/2023 in [facility] Cardiac and Pulmonary Rehab  Date 07/15/23  Educator [facility]  Instruction Review  Code 1- Verbalizes Understanding       Other: -Provides group and  verbal instruction on various topics (see comments)   Knowledge Questionnaire Score:  Knowledge Questionnaire Score - 07/15/23 1527       Knowledge Questionnaire Score   Pre Score 22/26             Core Components/Risk Factors/Patient Goals at Admission:  Personal Goals and Risk Factors at Admission - 07/04/23 1340       Core Components/Risk Factors/Patient Goals on Admission   Heart Failure Yes    Intervention Provide a combined exercise and nutrition program that is supplemented with education, support and counseling about heart failure. Directed toward relieving symptoms such as shortness of breath, decreased exercise tolerance, and extremity edema.    Expected Outcomes Improve functional capacity of life;Short term: Attendance in program 2-3 days a week with increased exercise capacity. Reported lower sodium intake. Reported increased fruit and vegetable intake. Reports medication compliance.;Long term: Adoption of self-care skills and reduction of barriers for early signs and symptoms recognition and intervention leading to self-care maintenance.;Short term: Daily weights obtained and reported for increase. Utilizing diuretic protocols set by physician.    Hypertension Yes    Intervention Provide education on lifestyle modifcations including regular physical activity/exercise, weight management, moderate sodium restriction and increased consumption of fresh fruit, vegetables, and low fat dairy, alcohol moderation, and smoking cessation.;Monitor prescription use compliance.    Expected Outcomes Short Term: Continued assessment and intervention until BP is < 140/23mm HG in hypertensive participants. < 130/17mm HG in hypertensive participants with diabetes, heart failure or chronic kidney disease.;Long Term: Maintenance of blood pressure at goal levels.    Lipids Yes    Intervention Provide education and support for  participant on nutrition & aerobic/resistive exercise along with prescribed medications to achieve LDL 70mg , HDL >40mg .    Expected Outcomes Short Term: Participant states understanding of desired cholesterol values and is compliant with medications prescribed. Participant is following exercise prescription and nutrition guidelines.;Long Term: Cholesterol controlled with medications as prescribed, with individualized exercise RX and with personalized nutrition plan. Value goals: LDL < 70mg , HDL > 40 mg.             Education:Diabetes - Individual verbal and written instruction to review signs/symptoms of diabetes, desired ranges of glucose level fasting, after meals and with exercise. Acknowledge that pre and post exercise glucose checks will be done for 3 sessions at entry of program.   Core Components/Risk Factors/Patient Goals Review:   Goals and Risk Factor Review     Row Name 09/19/23 1452             Core Components/Risk Factors/Patient Goals Review   Personal Goals Review Weight Management/Obesity;Hypertension       Review Kenyon states that he checks his BP at home and it has stayed within normal ranges. He also states that he is comfortable with where his weight is at this time and according to his doctor he does not need to lose or gain any weight. He also reports taking all his medications as prescribed.       Expected Outcomes Short: Continue to check BP at home. Long: Continue to monitor lifestyle risk factors.                Core Components/Risk Factors/Patient Goals at Discharge (Final Review):   Goals and Risk Factor Review - 09/19/23 1452       Core Components/Risk Factors/Patient Goals Review   Personal Goals Review Weight Management/Obesity;Hypertension    Review Modou states that he checks his BP at  home and it has stayed within normal ranges. He also states that he is comfortable with where his weight is at this time and according to his doctor he does  not need to lose or gain any weight. He also reports taking all his medications as prescribed.    Expected Outcomes Short: Continue to check BP at home. Long: Continue to monitor lifestyle risk factors.             ITP Comments:  ITP Comments     Row Name 07/04/23 1344 07/15/23 1549 07/17/23 1429 08/07/23 0923 09/04/23 0947   ITP Comments Initial phone call completed. Diagnosis can be found in [facility] 7/13. EP Orientation scheduled for Monday 8/12 at 10:30. Completed and gym orientation. Initial ITP created and sent for review to Dr. Bethann Punches, Medical Director. First full day of exercise!  Patient was oriented to gym and equipment including functions, settings, policies, and procedures.  Patient's individual exercise prescription and treatment plan were reviewed.  All starting workloads were established based on the results of the 6 minute walk test done at initial orientation visit.  The plan for exercise progression was also introduced and progression will be customized based on patient's performance and goals. 30 Day review completed. Medical Director ITP review done, changes made as directed, and signed approval by Medical Director.     new to program 30 Day review completed. Medical Director ITP review done, changes made as directed, and signed approval by Medical Director.    Row Name 10/02/23 0835           ITP Comments 30 Day review completed. Medical Director ITP review done, changes made as directed, and signed approval by Medical Director.                Comments:

## 2023-10-02 NOTE — Progress Notes (Signed)
Daily Session Note  Patient Details  Name: [name]. MRN: [id number] Date of Birth: August 18, 1933 Referring Provider:    Encounter Date: 10/02/2023  Check In:  Session Check In - 10/02/23 1417       Check-In   Supervising physician immediately available to respond to emergencies See telemetry face sheet for immediately available ER MD    Location [facility]-Cardiac & Pulmonary Rehab    Staff Present Cora Collum, RN, BSN, CCRP;Joseph Millbrook, RCP,RRT,BSRT;Noah Tickle, BS, Exercise Physiologist;Megan Katrinka Blazing, RN, [place]    Virtual Visit No    Medication changes reported     No    Fall or balance concerns reported    No    Warm-up and Cool-down Performed on first and last piece of equipment    Resistance Training Performed Yes    VAD Patient? No    PAD/SET Patient? No      Pain Assessment   Currently in Pain? No/denies                Social History   Tobacco Use  Smoking Status Never  Smokeless Tobacco Never    Goals Met:  Independence with exercise equipment Exercise tolerated well No report of concerns or symptoms today  Goals Unmet:  Not Applicable  Comments: .Pt able to follow exercise prescription today without complaint.  Will continue to monitor for progression.     Dr. Bethann Punches is Medical Director for [facility] Cardiac Rehabilitation.  Dr. Vida Rigger is Medical Director for [facility] Pulmonary Rehabilitation.

## 2023-10-03 ENCOUNTER — Encounter: Payer: Medicare Other | Admitting: *Deleted

## 2023-10-03 DIAGNOSIS — I252 Old myocardial infarction: Secondary | ICD-10-CM | POA: Diagnosis not present

## 2023-10-03 DIAGNOSIS — I214 Non-ST elevation (NSTEMI) myocardial infarction: Secondary | ICD-10-CM

## 2023-10-03 NOTE — Progress Notes (Signed)
Subjective:  Patient ID: [name]., male    DOB: 02-06-1933,  MRN: [id number]  89 y.o. male presents to clinic with  at risk foot care. Patient has h/o diabetes with history of ulceration of L 3rd toe which was managed by Dr. Allena Katz. Last visit, July 04, 2023. He is accompanied by his wife on today's visit. Patient has had ABI studies performed. Wife states he will be having heart valve replacement on next month.  Patient has been diagnosed with neuropathy. Takes pregabalin.   New problem(s): None   PCP is Sparks, Duane Lope, MD.  Allergies  Allergen Reactions   Penicillins Rash    Rash    Rash    Gabapentin    Penicillin G    Codeine Nausea Only and Nausea And Vomiting    Other reaction(s): Vomiting Other reaction(s): Vomiting    Review of Systems: Negative except as noted in the HPI.   Objective:  [name]. is a pleasant 89 y.o. male WD, WN in NAD. AAO x 3.  Vascular Examination: Vascular status intact b/l with palpable pedal pulses. CFT immediate b/l. No edema. No pain with calf compression b/l. Skin temperature gradient WNL b/l. Pedal hair sparse.  Neurological Examination: Pt has subjective symptoms of neuropathy.  Dermatological Examination: Pedal skin with normal turgor, texture and tone b/l.   Toenails 1-5 b/l thick, discolored, elongated with subungual debris and pain on dorsal palpation.   Preulcerative lesion noted distal tip of left 3rd toe. There is visible subdermal hemorrhage. There is  no edema, no drainage, no odor, no fluctuance. There is blanchable erythema.   Musculoskeletal Examination: Muscle strength 5/5 to b/l LE. Clawtoe deformity L 3rd toe.  Radiographs: None         Last A1c:      Latest Ref Rng & Units 06/15/2023    9:52 PM 06/15/2023    4:55 PM  Hemoglobin A1C  Hemoglobin-A1c 4.8 - 5.6 % 6.3  6.3      Assessment:   1. Pain due to onychomycosis of toenail   2. Pre-ulcerative corn or callous    3. Peripheral polyneuropathy    Plan:  -Patient's family member present. All questions/concerns addressed on today's visit. -Informed patient's wife of ABI results. Advised her to follow up with Dr. Allena Katz for evaluation of left 3rd toe before patient's heart valve procedure. She related understanding. -Patient to continue soft, supportive shoe gear daily. -Toenails 1-5 b/l were debrided in length and girth with sterile nail nippers and dremel without iatrogenic bleeding.  -Preulcerative lesion pared L 3rd toe utilizing sterile scalpel blade. Total number pared=1. -Patient/POA to call should there be question/concern in the interim.  Return in about 9 weeks (around 11/28/2023).  Freddie Breech, DPM

## 2023-10-03 NOTE — Progress Notes (Signed)
Daily Session Note  Patient Details  Name: [name]. MRN: [id number] Date of Birth: [birth year] Referring Provider:    Encounter Date: 10/03/2023  Check In:  Session Check In - 10/03/23 1409       Check-In   Supervising physician immediately available to respond to emergencies See telemetry face sheet for immediately available ER MD    Location [facility]-Cardiac & Pulmonary Rehab    Staff Present Ronette Deter, BS, Exercise Physiologist;Joseph Lyle, RCP,RRT,BSRT;Meredith [place], RN Atilano Median, RN, [place]    Virtual Visit No    Medication changes reported     No    Fall or balance concerns reported    No    Warm-up and Cool-down Performed on first and last piece of equipment    Resistance Training Performed Yes    VAD Patient? No    PAD/SET Patient? No      Pain Assessment   Currently in Pain? No/denies                Social History   Tobacco Use  Smoking Status Never  Smokeless Tobacco Never    Goals Met:  Independence with exercise equipment Exercise tolerated well No report of concerns or symptoms today Strength training completed today  Goals Unmet:  Not Applicable  Comments: Pt able to follow exercise prescription today without complaint.  Will continue to monitor for progression.    Dr. Bethann Punches is Medical Director for [facility] Cardiac Rehabilitation.  Dr. Vida Rigger is Medical Director for [facility] Pulmonary Rehabilitation.

## 2023-10-07 ENCOUNTER — Encounter: Payer: Medicare Other | Attending: Cardiology | Admitting: *Deleted

## 2023-10-07 DIAGNOSIS — Z5189 Encounter for other specified aftercare: Secondary | ICD-10-CM | POA: Diagnosis present

## 2023-10-07 DIAGNOSIS — I252 Old myocardial infarction: Secondary | ICD-10-CM | POA: Diagnosis not present

## 2023-10-07 DIAGNOSIS — I214 Non-ST elevation (NSTEMI) myocardial infarction: Secondary | ICD-10-CM

## 2023-10-07 NOTE — Progress Notes (Unsigned)
Cardiology Office Note  Date:  10/08/2023   ID:  Calvin Shorts [name], DOB 23-Jun-1933, MRN [id number]  PCP:  Calvin Arbour, MD   Chief Complaint  Patient presents with   3 month follow up     Patient is having a aortic valve replacement is scheduled October 22, 2024 at [place]. Patient c/o shortness of breath & extreme fatigue. Medications reviewed by the patient verbally.     HPI:  [name] is a 89 year-old gentleman with a history of  coronary artery disease,   stent placed to his proximal RCA in September 2007, taxis stent 3.0 x 24 mm, previous syncopal episode (Etiology of his spell is uncertain),  50% carotid disease on the right CVA 2018, History of peripheral vision issues  EF 35 to 40% Moderate aortic valve stenosis who presents for routine followup of his coronary artery disease,  Tremor, and stroke New atrial fibrillation seen on today's visit  Last seen by myself in clinic July 02, 2023 At that time had been evaluated at [facility] for discussion concerning Watchman device He was tolerating Eliquis 2.5 twice daily  Seen in the hospital June 2024 troponin 1300 Cardiac catheterization June 15, 2023 Severe multivessel coronary disease with unclear culprit Moderately reduced ejection fraction Not a candidate for CABG  Echo June 16, 2023 EF 35 to 40% Moderate aortic valve stenosis mean gradient 16 mmHg, peak gradient 25, estimated aortic valve area 0.8, Low-flow, low gradient  Seen by [facility] cardiology July 12, 2023 Statin held, spironolactone 12.53 days a week, Jardiance 10, Imdur held, Lasix as needed  On further follow-up losartan added but caused orthostasis symptoms  Echo at [facility] September 05, 2023 Aortic valve stenosis mean gradient 21 mmHg peak gradient 35 Moderate MR EF essentially unchanged 35%  He has since been evaluated by TAVR clinic at [facility], has completed CT scan Scheduled for TAVR end of November 2024  No regular exercise program [name] who presents  with him today reports shortness of breath on exertion Chronic fatigue Minimally communicative during his visit today  EKG personally reviewed by myself on todays visit EKG Interpretation Date/Time:  Tuesday October 08 2023 16:09:26 EST Ventricular Rate:  63 PR Interval:    QRS Duration:  130 QT Interval:  450 QTC Calculation: 460 R Axis:   -34  Text Interpretation: Normal sinus rhythm with  run of atrial tachycardia Left axis deviation Non-specific intra-ventricular conduction block Minimal voltage criteria for LVH, may be normal variant ( Cornell product ) Inferior infarct (cited on or before 17-Jun-2023) When compared with ECG of 02-Jul-2023 15:20, No significant change was found Confirmed by [name] [phone]) on 10/08/2023 4:27:24 PM   Admitted to the hospital with chest pain May 11, 2023 Troponin 1324 Cardiac catheterization performed June 15, 2023 Severe multivessel coronary disease with unclear culprit Ostial-proximal LCx subtotal 99 to 100% occlusion with <TIMI I flow Mild diffuse proximal LAD disease with 70% stenosis after short mid vessel diagonal branch, the large Ramus-like first diagonal with focal 50% stenosis Heavily calcified ostial RCA (very difficult to engage) 95% stenosis followed by diffuse 40 to 60% ISR of the proximal to mid vessel stent followed by 99 % subtotal occlusion after the stent crossing major RV marginal branch. Brisk collaterals fill the PDA  Moderate reduced EF with basal to mid inferior essentially akinesis and inferolateral hypokinesis.  Transfer to [facility] Chest pain-free at [facility] [address] Left circumflex felt to be subtotal, very small in caliber, diffuse moderate disease of mid LAD  and large diagonal with heavy calcification RCA with critical ostial and mid vessel disease with stents between with moderate in-stent restenosis, entire RCA severely calcified CABG not felt to be a good candidate Eliquis, Plavix, isosorbide, Ranexa  Zio monitor from  April 2024 Frequent episodes of tachycardia, frequent PACs 408 Supraventricular Tachycardia runs occurred, the run with the fastest interval lasting 5 beats with a max rate of 146 bpm, the longest lasting 25.4 secs with an avg rate of 110 bpm.  History of bradycardia  past medical history reviewed history of left parieto-occipital hemorrhagic stroke  seen by ophthamologist 3/22 who did HVF test which showed right inf>sup quandrantopsia. felt this was a new finding [name] denied any new visual field deficits.  He has been driving Seen in the Er 05/06/5408 Head MRI: nothing acute  01/16/2020,  ED for syncope 3 alcoholic beverages that night, which was more than his usual  woke around 2 AM, and after entering the kitchen, had a syncopal episode  --received his second COVID-19 vaccination 4 days prior. usually spends some time on the edge of the bed and seated before standing, in order to prevent dizziness  daughter reports the fall / LOC woke her from sleep She found him slumped over on the floor.  he was cold to the touch and unable to speak  blood pressure was low.  EMS took his BP when they arrived and noted it was low.  MRA was without acute abnormality and showed mild chronic small vessel ischemic dz and moderate cerebral atrophy. chronic hemorrhagic L temporoparietal infarct.     fall while in [place] 2019 Was watching a football game, stood up and had a mechanical fall tripped over some shoes 10/24/2018 Suffered a hip fracture , was hospitalized  Following the surgery the physicians increased his lisinopril up to 20 mg daily and placed him on aspirin 325 mg daily He has started to have some upset stomach, wonders if it could be the high-dose aspirin  February 2018 he had acute vision changes, word finding difficulty. He was taken by life flight to [facility] hospital MRI/MRA showing acute stroke Details of scan as below: 1. Acute left parieto-occipital intraparenchymal hematoma with  adjacent subarachnoid hemorrhage. This most likely represents hemorrhagic transformation from an embolic infarct given its configuration. No underlying mass. No other findings to suggest amyloid angiopathy. 2. Normal MRA of the head.  He had a CT scan of the neck showing 50% stenosis on the left, 10% on the right  PMH:   has a past medical history of Acute embolic stroke (HCC) (02/01/2017), BPH (benign prostatic hyperplasia), Bradycardia (11/10/2015), Coronary artery disease, Dizziness (08/21/2012), DYSPEPSIA (07/31/2010), Hyperlipidemia (05/16/2010), Hypertension, HYPERTENSION, BENIGN (05/16/2010), and Stenosis of right carotid artery (02/01/2017).  PSH:    Past Surgical History:  Procedure Laterality Date   ESOPHAGOGASTRODUODENOSCOPY (EGD) WITH PROPOFOL N/A 01/08/2023   Procedure: ESOPHAGOGASTRODUODENOSCOPY (EGD) WITH PROPOFOL;  Surgeon: Midge Minium, MD;  Location: [facility] ENDOSCOPY;  Service: Endoscopy;  Laterality: N/A;   HIP SURGERY     right hip    Intestinal Blockage     LEFT HEART CATH AND CORONARY ANGIOGRAPHY N/A 06/15/2023   Procedure: LEFT HEART CATH AND CORONARY ANGIOGRAPHY;  Surgeon: Marykay Lex, MD;  Location: [facility] INVASIVE CV LAB;  Service: Cardiovascular;  Laterality: N/A;   LOOP RECORDER INSERTION N/A 05/14/2017   Procedure: Loop Recorder Insertion;  Surgeon: Duke Salvia, MD;  Location: [facility] INVASIVE CV LAB;  Service: Cardiovascular;  Laterality: N/A;   SKIN  CANCER EXCISION     x2   TONSILLECTOMY      Current Outpatient Medications  Medication Sig Dispense Refill   acetaminophen (TYLENOL) 650 MG CR tablet Take 650 mg by mouth every 8 (eight) hours as needed.      apixaban (ELIQUIS) 2.5 MG TABS tablet Take 1 tablet (2.5 mg total) by mouth 2 (two) times daily. 180 tablet 3   Ascorbic Acid (VITAMIN C) 1000 MG tablet Take 1,000 mg by mouth daily.     atorvastatin (LIPITOR) 20 MG tablet Take 20 mg by mouth at bedtime.     b complex vitamins tablet Take 1 tablet by mouth daily.      cholecalciferol (VITAMIN D) 1000 units tablet Take 2,000 Units by mouth daily.     clopidogrel (PLAVIX) 75 MG tablet Take 1 tablet (75 mg total) by mouth daily. 90 tablet 0   cyanocobalamin (VITAMIN B12) 1000 MCG tablet Take 1,000 mcg by mouth daily.     docusate sodium (COLACE) 100 MG capsule Take 100 mg by mouth 2 (two) times daily.     empagliflozin (JARDIANCE) 25 MG TABS tablet Take by mouth.     ezetimibe (ZETIA) 10 MG tablet Take 1 tablet (10 mg total) by mouth daily. 90 tablet 3   FLAXSEED, LINSEED, PO Take 2 g by mouth daily.     metoprolol succinate (TOPROL-XL) 25 MG 24 hr tablet Take 1 tablet (25 mg total) by mouth daily. 30 tablet 11   pantoprazole (PROTONIX) 40 MG tablet Take 1 tablet (40 mg total) by mouth 2 (two) times daily. 60 tablet 1   pregabalin (LYRICA) 150 MG capsule Take 150 mg by mouth at bedtime.     Probiotic Product (UP4 PROBIOTICS PO) Take 7.5 mg by mouth.     ranolazine (RANEXA) 500 MG 12 hr tablet Take 1 tablet (500 mg total) by mouth 2 (two) times daily. 180 tablet 3   tamsulosin (FLOMAX) 0.4 MG CAPS capsule Take 2 capsules (0.8 mg total) by mouth daily. 30 capsule 0   zinc sulfate 220 (50 Zn) MG capsule Take 1 tablet by mouth daily.     clindamycin (CLEOCIN) 300 MG capsule Take 300 mg by mouth every 6 (six) hours. (Patient not taking: Reported on 08/25/2023)     furosemide (LASIX) 20 MG tablet Take 20 mg by mouth as needed. (Patient not taking: Reported on 10/08/2023)     nitroGLYCERIN (NITROSTAT) 0.4 MG SL tablet Place 1 tablet (0.4 mg total) under the tongue every 5 (five) minutes x 3 doses as needed for chest pain. (Patient not taking: Reported on 10/08/2023) 25 tablet 0   potassium chloride (KLOR-CON) 10 MEQ tablet Take 10 mEq by mouth as needed. (Patient not taking: Reported on 10/08/2023)     pregabalin (LYRICA) 100 MG capsule Take 100 mg by mouth in the morning. (Patient not taking: Reported on 10/08/2023)     No current facility-administered medications for  this visit.    Allergies:   Penicillins, Gabapentin, Penicillin g, and Codeine   Social History:  The patient  reports that he has never smoked. He has never used smokeless tobacco. He reports current alcohol use. He reports that he does not use drugs.   Family History:   family history includes Cancer in an other family member; Hyperlipidemia in an other family member; Hypertension in his mother and another family member; Other in an other family member.    Review of Systems: Review of Systems  Constitutional: Negative.  HENT: Negative.    Respiratory: Negative.    Cardiovascular: Negative.   Gastrointestinal: Negative.   Musculoskeletal:  Positive for joint pain.  Neurological:  Positive for tremors.  Psychiatric/Behavioral:  Positive for memory loss.   All other systems reviewed and are negative.   PHYSICAL EXAM: VS:  BP 124/70 (BP Location: Left Arm, Patient Position: Sitting, Cuff Size: Normal)   Pulse 63   Ht 5\' 8"  (1.727 m)   Wt 173 lb 2 oz (78.5 kg)   SpO2 96%   BMI 26.32 kg/m  , BMI Body mass index is 26.32 kg/m. Constitutional:  oriented to person, place, and time. No distress.  HENT:  Head: Grossly normal Eyes:  no discharge. No scleral icterus.  Neck: No JVD, no carotid bruits  Cardiovascular: Regular rate and rhythm, no murmurs appreciated Trace bilateral lower extremity edema, pitting above the sock line Pulmonary/Chest: Clear to auscultation bilaterally, no wheezes or rails Abdominal: Soft.  no distension.  no tenderness.  Musculoskeletal: Normal range of motion Neurological:  normal muscle tone. Coordination normal. No atrophy Skin: Skin warm and dry Psychiatric: normal affect, pleasant  Recent Labs: 01/08/2023: TSH 9.197 05/11/2023: Magnesium 2.3 06/26/2023: B Natriuretic Peptide 460.1 08/25/2023: ALT 17; BUN 22; Creatinine, Ser 1.44; Hemoglobin 13.2; Platelets 170; Potassium 4.0; Sodium 132    Lipid Panel Lab Results  Component Value Date   CHOL 90  06/16/2023   HDL 40 (L) 06/16/2023   LDLCALC 44 06/16/2023   TRIG 30 06/16/2023    Wt Readings from Last 3 Encounters:  10/08/23 173 lb 2 oz (78.5 kg)  08/25/23 173 lb 15.1 oz (78.9 kg)  07/15/23 174 lb (78.9 kg)     ASSESSMENT AND PLAN:  Atrial fibrillation, Any normal sinus rhythm Continue Eliquis 2.5 twice daily He previously expressed his concern of being on 5 twice daily On Ranexa, metoprolol succinate 25 daily  Bradycardia Stable on low-dose metoprolol succinate  Aortic valve stenosis In the setting of reduced ejection fraction, low-flow low gradient Likely has a narrow window for TAVR given age, general debility Set up for TAVR end of November 2024 at [facility] We have tried to tamper unrealistic expectations of dramatic clinical improvement post procedure Lasix as needed for worsening shortness of breath  Hyperlipidemia, unspecified hyperlipidemia type - Back on Lipitor at reduced dose  HYPERTENSION, BENIGN - Plan: EKG 12-Lead Blood pressure is well controlled on today's visit. No changes made to the medications.  Atherosclerosis of native coronary artery of native heart with chronic stable angina pectoris - Severe three-vessel coronary disease not amenable to intervention Was seen by interventional at [facility] Tolerating Ranexa, metoprolol  Imdur held by team at [facility] Denies unstable angina symptoms  syncope, unspecified syncope type - Plan: EKG 12-Lead Remote syncope No recent episodes of near syncope or syncope, blood pressure low but stable No changes to medications on today's visit  Acute embolic stroke [facility]) Previously wore loop monitor for 3 years with no significant arrhythmia Atrial fibrillation noted on last office visit On Eliquis reduced dose for age, renal dysfunction  Stenosis of right carotid artery Nonobstructive disease, Cholesterol at goal  Essential tremor Followed by neurology  Cardiomyopathy Ejection fraction 35 to 40% after  recent non-STEMI Metoprolol succinate, Jardiance By report did not handle losartan 12.5 daily secondary to orthostasis    Total encounter time more than 40 minutes  Greater than 50% was spent in counseling and coordination of care with the patient     Orders Placed This Encounter  Procedures   EKG 12-Lead     Signed, [name] Pollard, M.D., Ph.D. 10/08/2023  [facility] [place], [place] [phone]

## 2023-10-07 NOTE — Progress Notes (Signed)
Daily Session Note  Patient Details  Name: [name]. MRN: [id number] Date of Birth: 08-24-1933 Referring Provider:    Encounter Date: 10/07/2023  Check In:  Session Check In - 10/07/23 1535       Check-In   Supervising physician immediately available to respond to emergencies See telemetry face sheet for immediately available ER MD    Location [facility]-Cardiac & Pulmonary Rehab    Staff Present Maxon Suzzette Righter, , Exercise Physiologist;Oral Hallgren Jewel Baize, RN BSN;Joseph Reino Kent, RCP,RRT,BSRT    Virtual Visit No    Medication changes reported     No    Fall or balance concerns reported    No    Warm-up and Cool-down Performed on first and last piece of equipment    Resistance Training Performed Yes    VAD Patient? No    PAD/SET Patient? No      Pain Assessment   Currently in Pain? No/denies                Social History   Tobacco Use  Smoking Status Never  Smokeless Tobacco Never    Goals Met:  Independence with exercise equipment Exercise tolerated well No report of concerns or symptoms today Strength training completed today  Goals Unmet:  Not Applicable  Comments: Pt able to follow exercise prescription today without complaint.  Will continue to monitor for progression.    Dr. Bethann Punches is Medical Director for [facility] Cardiac Rehabilitation.  Dr. Vida Rigger is Medical Director for [facility] Pulmonary Rehabilitation.

## 2023-10-08 ENCOUNTER — Encounter: Payer: Self-pay | Admitting: Cardiovascular Disease

## 2023-10-08 ENCOUNTER — Ambulatory Visit: Payer: Medicare Other | Attending: Cardiovascular Disease | Admitting: Cardiovascular Disease

## 2023-10-08 VITALS — BP 124/70 | HR 63 | Ht 68.0 in | Wt 173.1 lb

## 2023-10-08 DIAGNOSIS — I4819 Other persistent atrial fibrillation: Secondary | ICD-10-CM | POA: Diagnosis present

## 2023-10-08 DIAGNOSIS — I25110 Atherosclerotic heart disease of native coronary artery with unstable angina pectoris: Secondary | ICD-10-CM | POA: Diagnosis not present

## 2023-10-08 DIAGNOSIS — E785 Hyperlipidemia, unspecified: Secondary | ICD-10-CM | POA: Insufficient documentation

## 2023-10-08 DIAGNOSIS — I495 Sick sinus syndrome: Secondary | ICD-10-CM | POA: Diagnosis not present

## 2023-10-08 DIAGNOSIS — I10 Essential (primary) hypertension: Secondary | ICD-10-CM | POA: Insufficient documentation

## 2023-10-08 DIAGNOSIS — I5032 Chronic diastolic (congestive) heart failure: Secondary | ICD-10-CM | POA: Insufficient documentation

## 2023-10-08 DIAGNOSIS — I639 Cerebral infarction, unspecified: Secondary | ICD-10-CM | POA: Diagnosis present

## 2023-10-08 DIAGNOSIS — I2510 Atherosclerotic heart disease of native coronary artery without angina pectoris: Secondary | ICD-10-CM | POA: Insufficient documentation

## 2023-10-08 DIAGNOSIS — R001 Bradycardia, unspecified: Secondary | ICD-10-CM | POA: Diagnosis present

## 2023-10-08 DIAGNOSIS — Z9861 Coronary angioplasty status: Secondary | ICD-10-CM | POA: Insufficient documentation

## 2023-10-08 NOTE — Patient Instructions (Signed)
Medication Instructions:  No changes  If you need a refill on your cardiac medications before your next appointment, please call your pharmacy.   Lab work: No new labs needed  Testing/Procedures: No new testing needed  Follow-Up: At [facility], you and your health needs are our priority.  As part of our continuing mission to provide you with exceptional heart care, we have created designated Provider Care Teams.  These Care Teams include your primary Cardiologist (physician) and Advanced Practice Providers (APPs -  Physician Assistants and Nurse Practitioners) who all work together to provide you with the care you need, when you need it.  You will need a follow up appointment in Jan 2025  Providers on your designated Care Team:   Nicolasa Ducking, NP Eula Listen, PA-C Cadence Fransico Michael, [place]  COVID-19 Vaccine Information can be found at: [email or web address] For questions related to vaccine distribution or appointments, please email [email or web address] or call [phone].

## 2023-10-09 ENCOUNTER — Encounter: Payer: Medicare Other | Admitting: *Deleted

## 2023-10-09 DIAGNOSIS — I214 Non-ST elevation (NSTEMI) myocardial infarction: Secondary | ICD-10-CM

## 2023-10-09 DIAGNOSIS — Z5189 Encounter for other specified aftercare: Secondary | ICD-10-CM | POA: Diagnosis not present

## 2023-10-09 NOTE — Progress Notes (Signed)
Daily Session Note  Patient Details  Name: [name]. MRN: [id number] Date of Birth: 1933/02/11 Referring Provider:    Encounter Date: 10/09/2023  Check In:  Session Check In - 10/09/23 1343       Check-In   Supervising physician immediately available to respond to emergencies See telemetry face sheet for immediately available ER MD    Location [facility]Cardiac [facility] Pul[facility] Re[facility]    Staff Present Tommye Standard, BS, ACSM CEP, Exercise Physiologist;Clairessa Boulet Jewel Baize, RN BSN;Noah Tickle, BS, Exercise Physiologist;Maxon Conetta BS, , Exercise Physiologist    Virtual Visit No    Medication changes reported     No    Fall or balance concerns reported    No    Warm-up and Cool-down Performed on first and last piece of equipment    Resistance Training Performed Yes    VAD Patient? No    PAD/SET Patient? No      Pain Assessment   Currently in Pain? No/denies                Social History   Tobacco Use  Smoking Status Never  Smokeless Tobacco Never    Goals Met:  Independence with exercise equipment Exercise tolerated well No report of concerns or symptoms today Strength training completed today  Goals Unmet:  Not Applicable  Comments: Pt able to follow exercise prescription today without complaint.  Will continue to monitor for progression.    Dr. Bethann Punches is Medical Director for [facility] Cardiac Rehabilitation.  Dr. Vida Rigger is Medical Director for [facility] Pulmonary Rehabilitation.

## 2023-10-10 ENCOUNTER — Ambulatory Visit: Payer: Medicare Other | Admitting: Podiatry

## 2023-10-10 DIAGNOSIS — Z7901 Long term (current) use of anticoagulants: Secondary | ICD-10-CM | POA: Insufficient documentation

## 2023-10-10 DIAGNOSIS — I252 Old myocardial infarction: Secondary | ICD-10-CM | POA: Insufficient documentation

## 2023-10-10 DIAGNOSIS — Z955 Presence of coronary angioplasty implant and graft: Secondary | ICD-10-CM | POA: Insufficient documentation

## 2023-10-14 ENCOUNTER — Encounter: Payer: Medicare Other | Admitting: *Deleted

## 2023-10-14 ENCOUNTER — Ambulatory Visit: Payer: Medicare Other

## 2023-10-14 DIAGNOSIS — Z5189 Encounter for other specified aftercare: Secondary | ICD-10-CM | POA: Diagnosis not present

## 2023-10-14 DIAGNOSIS — I214 Non-ST elevation (NSTEMI) myocardial infarction: Secondary | ICD-10-CM

## 2023-10-14 NOTE — Progress Notes (Signed)
Daily Session Note  Patient Details  Name: [name]. MRN: [id number] Date of Birth: 19-Nov-1933 Referring Provider:    Encounter Date: 10/14/2023  Check In:  Session Check In - 10/14/23 1524       Check-In   Supervising physician immediately available to respond to emergencies See telemetry face sheet for immediately available ER MD    Location [facility]iac [facility] Pulmonary Re[facility]    Staff Present Maxon Suzzette Righter, , Exercise Physiologist;Ellice B[place]house Jewel Baize, RN BSN;Joseph Shelbie Proctor, RN, [place]    Virtual Visit No    Medication changes reported     No    Fall or balance concerns reported    No    Warm-up and Cool-down Performed on first and last piece of equipment    Resistance Training Performed Yes    VAD Patient? No    PAD/SET Patient? No      Pain Assessment   Currently in Pain? No/denies                Social History   Tobacco Use  Smoking Status Never  Smokeless Tobacco Never    Goals Met:  Independence with exercise equipment Exercise tolerated well No report of concerns or symptoms today Strength training completed today  Goals Unmet:  Not Applicable  Comments: Pt able to follow exercise prescription today without complaint.  Will continue to monitor for progression.    Dr. Bethann Punches is Medical Director for [facility] Cardiac Rehabilitation.  Dr. Vida Rigger is Medical Director for [facility] Pulmonary Rehabilitation.

## 2023-10-15 ENCOUNTER — Encounter: Payer: Self-pay | Admitting: Podiatry

## 2023-10-15 ENCOUNTER — Ambulatory Visit (INDEPENDENT_AMBULATORY_CARE_PROVIDER_SITE_OTHER): Payer: Medicare Other | Admitting: Podiatry

## 2023-10-15 VITALS — Ht 68.0 in | Wt 173.1 lb

## 2023-10-15 DIAGNOSIS — L97521 Non-pressure chronic ulcer of other part of left foot limited to breakdown of skin: Secondary | ICD-10-CM | POA: Diagnosis not present

## 2023-10-15 NOTE — Progress Notes (Signed)
Subjective:  Patient ID: Calvin Amber., male    DOB: September 11, 1933,  MRN: [id number]  Chief Complaint  Patient presents with   Nail Problem    Infection left 3rd toenail.    89 y.o. male presents with the above complaint.  Patient presents with follow-up of left third digit preulcerative callus wanted get eval make sure that there is no ulceration denies any other acute complaints   Review of Systems: Negative except as noted in the HPI. Denies N/V/F/Ch.  Past Medical History:  Diagnosis Date   Acute embolic stroke (HCC) 02/01/2017   BPH (benign prostatic hyperplasia)    Bradycardia 11/10/2015   Coronary artery disease    Dizziness 08/21/2012   DYSPEPSIA 07/31/2010   Qualifier: Diagnosis of  By: Janee Morn RN, Morrie Sheldon     Hyperlipidemia 05/16/2010   Qualifier: Diagnosis of  By: Mariah Milling MD, [name]      Hypertension    HYPERTENSION, BENIGN 05/16/2010   Qualifier: Diagnosis of  By: Mariah Milling MD, [name]     Stenosis of right carotid artery 02/01/2017    Current Outpatient Medications:    acetaminophen (TYLENOL) 650 MG CR tablet, Take 650 mg by mouth every 8 (eight) hours as needed. , Disp: , Rfl:    apixaban (ELIQUIS) 2.5 MG TABS tablet, Take 1 tablet (2.5 mg total) by mouth 2 (two) times daily., Disp: 180 tablet, Rfl: 3   Ascorbic Acid (VITAMIN C) 1000 MG tablet, Take 1,000 mg by mouth daily., Disp: , Rfl:    atorvastatin (LIPITOR) 20 MG tablet, Take 20 mg by mouth at bedtime., Disp: , Rfl:    b complex vitamins tablet, Take 1 tablet by mouth daily., Disp: , Rfl:    cholecalciferol (VITAMIN D) 1000 units tablet, Take 2,000 Units by mouth daily., Disp: , Rfl:    clindamycin (CLEOCIN) 300 MG capsule, Take 300 mg by mouth every 6 (six) hours., Disp: , Rfl:    clopidogrel (PLAVIX) 75 MG tablet, Take 1 tablet (75 mg total) by mouth daily., Disp: 90 tablet, Rfl: 0   cyanocobalamin (VITAMIN B12) 1000 MCG tablet, Take 1,000 mcg by mouth daily., Disp: , Rfl:    docusate sodium (COLACE) 100 MG  capsule, Take 100 mg by mouth 2 (two) times daily., Disp: , Rfl:    empagliflozin (JARDIANCE) 25 MG TABS tablet, Take by mouth., Disp: , Rfl:    ezetimibe (ZETIA) 10 MG tablet, Take 1 tablet (10 mg total) by mouth daily., Disp: 90 tablet, Rfl: 3   FLAXSEED, LINSEED, PO, Take 2 g by mouth daily., Disp: , Rfl:    furosemide (LASIX) 20 MG tablet, Take 20 mg by mouth as needed., Disp: , Rfl:    metoprolol succinate (TOPROL-XL) 25 MG 24 hr tablet, Take 1 tablet (25 mg total) by mouth daily., Disp: 30 tablet, Rfl: 11   nitroGLYCERIN (NITROSTAT) 0.4 MG SL tablet, Place 1 tablet (0.4 mg total) under the tongue every 5 (five) minutes x 3 doses as needed for chest pain., Disp: 25 tablet, Rfl: 0   potassium chloride (KLOR-CON) 10 MEQ tablet, Take 10 mEq by mouth as needed., Disp: , Rfl:    pregabalin (LYRICA) 100 MG capsule, Take 100 mg by mouth in the morning., Disp: , Rfl:    pregabalin (LYRICA) 150 MG capsule, Take 150 mg by mouth at bedtime., Disp: , Rfl:    Probiotic Product (UP4 PROBIOTICS PO), Take 7.5 mg by mouth., Disp: , Rfl:    ranolazine (RANEXA) 500 MG 12 hr tablet, Take  1 tablet (500 mg total) by mouth 2 (two) times daily., Disp: 180 tablet, Rfl: 3   tamsulosin (FLOMAX) 0.4 MG CAPS capsule, Take 2 capsules (0.8 mg total) by mouth daily., Disp: 30 capsule, Rfl: 0   zinc sulfate 220 (50 Zn) MG capsule, Take 1 tablet by mouth daily., Disp: , Rfl:    pantoprazole (PROTONIX) 40 MG tablet, Take 1 tablet (40 mg total) by mouth 2 (two) times daily., Disp: 60 tablet, Rfl: 1  Social History   Tobacco Use  Smoking Status Never  Smokeless Tobacco Never    Allergies  Allergen Reactions   Penicillins Rash    Rash    Rash    Gabapentin    Penicillin G    Codeine Nausea Only and Nausea And Vomiting    Other reaction(s): Vomiting Other reaction(s): Vomiting   Objective:  There were no vitals filed for this visit. Body mass index is 26.32 kg/m. Constitutional Well developed. Well  nourished.  Vascular Dorsalis pedis pulses faint the palpable bilaterally. Posterior tibial pulses faint the palpable bilaterally. Capillary refill limb-to all digits.  No cyanosis or clubbing noted. Pedal hair growth normal.  Neurologic Normal speech. Oriented to person, place, and time. Epicritic sensation to light touch grossly present bilaterally.  Dermatologic Left third digit ulceration preulcerative callus without breakdown.  There is no redness present.  No clinical signs of infection no purulent drainage does not probe down to bone.  Orthopedic: Normal joint ROM without pain or crepitus bilaterally. No visible deformities. No bony tenderness.   Radiographs: 3 views of skeletally mature adult left foot:No radiographic notes of cortical structure noted to the third digit.  No soft tissue emphysema noted no bony abnormalities identified Assessment:   No diagnosis found.   Plan:  Patient was evaluated and treated and all questions answered.  Left third digit preulcerative callus -No debridement was necessary as this mostly granular in nature -Encouraged him to continue wearing surgical shoe to help offload the toe -ABI shows normal blood flow -Continue doing Betadine wet-to-dry -Clinically no signs of ulceration noted at this time.  I continue to discuss with the patient shoe gear modification and  No follow-ups on file.

## 2023-10-16 ENCOUNTER — Encounter: Payer: Medicare Other | Admitting: *Deleted

## 2023-10-16 VITALS — Ht 68.5 in | Wt 172.0 lb

## 2023-10-16 DIAGNOSIS — Z5189 Encounter for other specified aftercare: Secondary | ICD-10-CM | POA: Diagnosis not present

## 2023-10-16 DIAGNOSIS — I214 Non-ST elevation (NSTEMI) myocardial infarction: Secondary | ICD-10-CM

## 2023-10-16 NOTE — Progress Notes (Signed)
Daily Session Note  Patient Details  Name: [name]. MRN: [id number] Date of Birth: May 05, 1933 Referring Provider:    Encounter Date: 10/16/2023  Check In:  Session Check In - 10/16/23 1553       Check-In   Supervising physician immediately available to respond to emergencies See telemetry face sheet for immediately available ER MD    Location [facility]    Staff Present Susann Givens, RN BSN;Joseph Lincoln, RCP,RRT,BSRT;Megan Wrigley, RN, Franki Monte, BS, ACSM CEP, Exercise Physiologist    Virtual Visit No    Medication changes reported     No    Fall or balance concerns reported    No    Warm-up and Cool-down Performed on first and last piece of equipment    Resistance Training Performed Yes    VAD Patient? No    PAD/SET Patient? No      Pain Assessment   Currently in Pain? No/denies                Social History   Tobacco Use  Smoking Status Never  Smokeless Tobacco Never    Goals Met:  Independence with exercise equipment Exercise tolerated well No report of concerns or symptoms today Strength training completed today  Goals Unmet:  Not Applicable  Comments: Pt able to follow exercise prescription today without complaint.  Will continue to monitor for progression.   6 Minute Walk     Row Name 07/15/23 1438 10/16/23 1550       6 Minute Walk   Phase Initial Discharge    Distance 780 feet 715 feet    Distance % Change -- -8 %    Distance Feet Change -- -65 ft    Walk Time 6 minutes 6 minutes    # of Rest Breaks 0 0    MPH 1.5 1.35    METS 1.03 1    RPE 13 15    Perceived Dyspnea  1 0    VO2 Peak 3.6 3.3    Symptoms No No    Resting HR 83 bpm 70 bpm    Resting BP 100/56 118/60    Resting Oxygen Saturation  97 % --    Exercise Oxygen Saturation  during 6 min walk 100 % --    Max Ex. HR 93 bpm 92 bpm    Max Ex. BP 120/60 122/80    2 Minute Post BP 120/62 120/80               Dr. Bethann Punches is  Medical Director for [facility].  Dr. Vida Rigger is Medical Director for [facility].

## 2023-10-16 NOTE — Patient Instructions (Signed)
Discharge Patient Instructions  Patient Details  Name: [name]. MRN: [id number] Date of Birth: February 03, 1933 Referring Provider:  [name], MD   Number of Visits: 59  Reason for Discharge:  Patient reached a stable level of exercise. Patient independent in their exercise. Patient has met program and personal goals. Early Exit:  [name] is graduating early due to upcoming surgery   Smoking History:  Social History   Tobacco Use  Smoking Status Never  Smokeless Tobacco Never    Diagnosis:  NSTEMI (non-ST elevated myocardial infarction) [facility])  Initial Exercise Prescription:  Initial Exercise Prescription - 07/15/23 1500       Recumbant Bike   RPM 50    Watts 15    Minutes 15    METs 1.03      NuStep   Level 1    SPM 80    Minutes 15    METs 1.03      Track   Laps 10    Minutes 15    METs 1.54      Prescription Details   Frequency (times per week) 3    Duration Progress to 30 minutes of continuous aerobic without signs/symptoms of physical distress      Intensity   THRR 40-80% of Max Heartrate 103-123    Ratings of Perceived Exertion 11-13    Perceived Dyspnea 0-4      Progression   Progression Continue to progress workloads to maintain intensity without signs/symptoms of physical distress.      Resistance Training   Training Prescription Yes    Weight 3lb    Reps 10-15             Discharge Exercise Prescription (Final Exercise Prescription Changes):  Exercise Prescription Changes - 10/03/23 1400       Response to Exercise   Blood Pressure (Admit) 102/68    Blood Pressure (Exit) 122/72    Heart Rate (Admit) 61 bpm    Heart Rate (Exercise) 108 bpm    Heart Rate (Exit) 71 bpm    Rating of Perceived Exertion (Exercise) 15    Perceived Dyspnea (Exercise) 0    Symptoms none    Duration Continue with 30 min of aerobic exercise without signs/symptoms of physical distress.    Intensity THRR unchanged      Progression    Progression Continue to progress workloads to maintain intensity without signs/symptoms of physical distress.    Average METs 1.97      Resistance Training   Training Prescription Yes    Weight 2lb    Reps 10-15      Interval Training   Interval Training No      NuStep   Level 2    Minutes 15    METs 1.8      T5 Nustep   Level 2   T6   Minutes 15    METs 1.6      Track   Laps 29    Minutes 15    METs 2.58      Home Exercise Plan   Plans to continue exercise at Home (comment)   walk or use exercise equipment   Frequency Add 1 additional day to program exercise sessions.    Initial Home Exercises Provided 09/30/23      Oxygen   Maintain Oxygen Saturation 88% or higher             Functional Capacity:  6 Minute Walk     Row  Name 07/15/23 1438 10/16/23 1550       6 Minute Walk   Phase Initial Discharge    Distance 780 feet 715 feet    Distance % Change -- -8 %    Distance Feet Change -- -65 ft    Walk Time 6 minutes 6 minutes    # of Rest Breaks 0 0    MPH 1.5 1.35    METS 1.03 1    RPE 13 15    Perceived Dyspnea  1 0    VO2 Peak 3.6 3.3    Symptoms No No    Resting HR 83 bpm 70 bpm    Resting BP 100/56 118/60    Resting Oxygen Saturation  97 % --    Exercise Oxygen Saturation  during 6 min walk 100 % --    Max Ex. HR 93 bpm 92 bpm    Max Ex. BP 120/60 122/80    2 Minute Post BP 120/62 120/80             Nutrition & Weight - Outcomes:  Pre Biometrics - 07/15/23 1517       Pre Biometrics   Height 5' 8.6" (1.742 m)    Weight 174 lb (78.9 kg)    Waist Circumference 43.5 inches    Hip Circumference 39.2 inches    Waist to Hip Ratio 1.11 %    BMI (Calculated) 26.01    Single Leg Stand 0 seconds             Post Biometrics - 10/16/23 1552        Post  Biometrics   Height 5' 8.5" (1.74 m)    Weight 172 lb (78 kg)    Waist Circumference 43 inches    Hip Circumference 40 inches    Waist to Hip Ratio 1.08 %    BMI (Calculated)  25.77    Single Leg Stand 0 seconds   did not attempt            Nutrition:  Nutrition Therapy & Goals - 07/17/23 1426       Nutrition Therapy   Diet Cardiac    Protein (specify units) >75g    Fiber 30 grams    Whole Grain Foods 3 servings    Saturated Fats 15 max. grams    Fruits and Vegetables 5 servings/day    Sodium 2 grams      Personal Nutrition Goals   Nutrition Goal Eat a protein at every meal    Personal Goal #2 Drink 64oz of water daily    Personal Goal #3 Pair protein with a carb    Comments Patient presents with wife. He is drinking ~64oz of water daily. Doesn't drink anything else other than a cup of black coffee in the morning. Spoke to them about setting goal to get at least 64oz consistently. Reviewed mediterranean diet handout, encouraged whole grains, healthy fats, and colorful produce. Spoke to them about nutrient and calorie density. Warned about filling up on low calorie veggies and not having enough room for protein and complex carbs. Wife took notes and asked questions, was able to teach back clearly. Set goal to eat a protein at every meal and to build more balanced meals. Provided some examples and brainstormed small meals and snacks with foods he likes and will eat with focus on ensuring he meets calorie and protein goals to prevent unintended weight loss.      Intervention Plan   Intervention Prescribe,  educate and counsel regarding individualized specific dietary modifications aiming towards targeted core components such as weight, hypertension, lipid management, diabetes, heart failure and other comorbidities.;Nutrition handout(s) given to patient.    Expected Outcomes Short Term Goal: Understand basic principles of dietary content, such as calories, fat, sodium, cholesterol and nutrients.;Short Term Goal: A plan has been developed with personal nutrition goals set during dietitian appointment.;Long Term Goal: Adherence to prescribed nutrition plan.              Goals reviewed with patient; copy given to patient.

## 2023-10-17 DIAGNOSIS — Z5189 Encounter for other specified aftercare: Secondary | ICD-10-CM | POA: Diagnosis not present

## 2023-10-17 DIAGNOSIS — I214 Non-ST elevation (NSTEMI) myocardial infarction: Secondary | ICD-10-CM

## 2023-10-17 NOTE — Progress Notes (Signed)
Cardiac Individual Treatment Plan  Patient Details  Name: [name]. MRN: [id number] Date of Birth: 07/09/1933 Referring Provider:  Dr. Mariah Milling  Initial Encounter Date: 07/15/23  Visit Diagnosis: NSTEMI (non-ST elevated myocardial infarction) [facility])  Patient's Home Medications on Admission:  Current Outpatient Medications:    acetaminophen (TYLENOL) 650 MG CR tablet, Take 650 mg by mouth every 8 (eight) hours as needed. , Disp: , Rfl:    apixaban (ELIQUIS) 2.5 MG TABS tablet, Take 1 tablet (2.5 mg total) by mouth 2 (two) times daily., Disp: 180 tablet, Rfl: 3   Ascorbic Acid (VITAMIN C) 1000 MG tablet, Take 1,000 mg by mouth daily., Disp: , Rfl:    atorvastatin (LIPITOR) 20 MG tablet, Take 20 mg by mouth at bedtime., Disp: , Rfl:    b complex vitamins tablet, Take 1 tablet by mouth daily., Disp: , Rfl:    cholecalciferol (VITAMIN D) 1000 units tablet, Take 2,000 Units by mouth daily., Disp: , Rfl:    clindamycin (CLEOCIN) 300 MG capsule, Take 300 mg by mouth every 6 (six) hours., Disp: , Rfl:    clopidogrel (PLAVIX) 75 MG tablet, Take 1 tablet (75 mg total) by mouth daily., Disp: 90 tablet, Rfl: 0   cyanocobalamin (VITAMIN B12) 1000 MCG tablet, Take 1,000 mcg by mouth daily., Disp: , Rfl:    docusate sodium (COLACE) 100 MG capsule, Take 100 mg by mouth 2 (two) times daily., Disp: , Rfl:    empagliflozin (JARDIANCE) 25 MG TABS tablet, Take by mouth., Disp: , Rfl:    ezetimibe (ZETIA) 10 MG tablet, Take 1 tablet (10 mg total) by mouth daily., Disp: 90 tablet, Rfl: 3   FLAXSEED, LINSEED, PO, Take 2 g by mouth daily., Disp: , Rfl:    furosemide (LASIX) 20 MG tablet, Take 20 mg by mouth as needed., Disp: , Rfl:    metoprolol succinate (TOPROL-XL) 25 MG 24 hr tablet, Take 1 tablet (25 mg total) by mouth daily., Disp: 30 tablet, Rfl: 11   nitroGLYCERIN (NITROSTAT) 0.4 MG SL tablet, Place 1 tablet (0.4 mg total) under the tongue every 5 (five) minutes x 3 doses as needed for chest  pain., Disp: 25 tablet, Rfl: 0   pantoprazole (PROTONIX) 40 MG tablet, Take 1 tablet (40 mg total) by mouth 2 (two) times daily., Disp: 60 tablet, Rfl: 1   potassium chloride (KLOR-CON) 10 MEQ tablet, Take 10 mEq by mouth as needed., Disp: , Rfl:    pregabalin (LYRICA) 100 MG capsule, Take 100 mg by mouth in the morning., Disp: , Rfl:    pregabalin (LYRICA) 150 MG capsule, Take 150 mg by mouth at bedtime., Disp: , Rfl:    Probiotic Product (UP4 PROBIOTICS PO), Take 7.5 mg by mouth., Disp: , Rfl:    ranolazine (RANEXA) 500 MG 12 hr tablet, Take 1 tablet (500 mg total) by mouth 2 (two) times daily., Disp: 180 tablet, Rfl: 3   tamsulosin (FLOMAX) 0.4 MG CAPS capsule, Take 2 capsules (0.8 mg total) by mouth daily., Disp: 30 capsule, Rfl: 0   zinc sulfate 220 (50 Zn) MG capsule, Take 1 tablet by mouth daily., Disp: , Rfl:   Past Medical History: Past Medical History:  Diagnosis Date   Acute embolic stroke (HCC) 02/01/2017   BPH (benign prostatic hyperplasia)    Bradycardia 11/10/2015   Coronary artery disease    Dizziness 08/21/2012   DYSPEPSIA 07/31/2010   Qualifier: Diagnosis of  By: Janee Morn RN, Morrie Sheldon     Hyperlipidemia 05/16/2010  Qualifier: Diagnosis of  By: Mariah Milling MD, [name]      Hypertension    HYPERTENSION, BENIGN 05/16/2010   Qualifier: Diagnosis of  By: Mariah Milling MD, [name]     Stenosis of right carotid artery 02/01/2017    Tobacco Use: Social History   Tobacco Use  Smoking Status Never  Smokeless Tobacco Never    Labs: Review Flowsheet       Latest Ref Rng & Units 01/23/2010 06/15/2023 06/16/2023  Labs for ITP Cardiac and Pulmonary Rehab  Cholestrol 0 - 200 mg/dL 191  - 90   LDL (calc) 0 - 99 mg/dL 47.8  - 44   HDL-C >29 mg/dL 56.2  - 40   Trlycerides <150 mg/dL 93  - 30   Hemoglobin Z3Y 4.8 - 5.6 % 5.4  6.3  6.3  -    Details       Multiple values from one day are sorted in reverse-chronological order          Exercise Target Goals: Exercise Program Goal: Individual  exercise prescription set using results from initial 6 min walk test and THRR while considering  patient's activity barriers and safety.   Exercise Prescription Goal: Initial exercise prescription builds to 30-45 minutes a day of aerobic activity, 2-3 days per week.  Home exercise guidelines will be given to patient during program as part of exercise prescription that the participant will acknowledge.   Education: Aerobic Exercise: - Group verbal and visual presentation on the components of exercise prescription. Introduces F.I.T.T principle from ACSM for exercise prescriptions.  Reviews F.I.T.T. principles of aerobic exercise including progression. Written material given at graduation. Flowsheet Row Cardiac Rehab from 07/15/2023 in [facility] Cardiac and Pulmonary Rehab  Education need identified 07/15/23       Education: Resistance Exercise: - Group verbal and visual presentation on the components of exercise prescription. Introduces F.I.T.T principle from ACSM for exercise prescriptions  Reviews F.I.T.T. principles of resistance exercise including progression. Written material given at graduation.    Education: Exercise & Equipment Safety: - Individual verbal instruction and demonstration of equipment use and safety with use of the equipment. Flowsheet Row Cardiac Rehab from 07/15/2023 in [facility] Cardiac and Pulmonary Rehab  Date 07/15/23  Educator [facility]  Instruction Review Code 1- Verbalizes Understanding       Education: Exercise Physiology & General Exercise Guidelines: - Group verbal and written instruction with models to review the exercise physiology of the cardiovascular system and associated critical values. Provides general exercise guidelines with specific guidelines to those with heart or lung disease.    Education: Flexibility, Balance, Mind/Body Relaxation: - Group verbal and visual presentation with interactive activity on the components of exercise prescription. Introduces  F.I.T.T principle from ACSM for exercise prescriptions. Reviews F.I.T.T. principles of flexibility and balance exercise training including progression. Also discusses the mind body connection.  Reviews various relaxation techniques to help reduce and manage stress (i.e. Deep breathing, progressive muscle relaxation, and visualization). Balance handout provided to take home. Written material given at graduation.   Activity Barriers & Risk Stratification:  Activity Barriers & Cardiac Risk Stratification - 07/15/23 1443       Activity Barriers & Cardiac Risk Stratification   Activity Barriers Arthritis;Back Problems;Balance Concerns;History of Falls;Muscular Weakness;Other (comment)    Comments Tremors in hands, neuropathy in feet    Cardiac Risk Stratification High             6 Minute Walk:  6 Minute Walk     Row Name 07/15/23  1438 10/16/23 1550       6 Minute Walk   Phase Initial Discharge    Distance 780 feet 715 feet    Distance % Change -- -8 %    Distance Feet Change -- -65 ft    Walk Time 6 minutes 6 minutes    # of Rest Breaks 0 0    MPH 1.5 1.35    METS 1.03 1    RPE 13 15    Perceived Dyspnea  1 0    VO2 Peak 3.6 3.3    Symptoms No No    Resting HR 83 bpm 70 bpm    Resting BP 100/56 118/60    Resting Oxygen Saturation  97 % --    Exercise Oxygen Saturation  during 6 min walk 100 % --    Max Ex. HR 93 bpm 92 bpm    Max Ex. BP 120/60 122/80    2 Minute Post BP 120/62 120/80             Oxygen Initial Assessment:   Oxygen Re-Evaluation:   Oxygen Discharge (Final Oxygen Re-Evaluation):   Initial Exercise Prescription:  Initial Exercise Prescription - 07/15/23 1500       Recumbant Bike   RPM 50    Watts 15    Minutes 15    METs 1.03      NuStep   Level 1    SPM 80    Minutes 15    METs 1.03      Track   Laps 10    Minutes 15    METs 1.54      Prescription Details   Frequency (times per week) 3    Duration Progress to 30 minutes of  continuous aerobic without signs/symptoms of physical distress      Intensity   THRR 40-80% of Max Heartrate 103-123    Ratings of Perceived Exertion 11-13    Perceived Dyspnea 0-4      Progression   Progression Continue to progress workloads to maintain intensity without signs/symptoms of physical distress.      Resistance Training   Training Prescription Yes    Weight 3lb    Reps 10-15             Perform Capillary Blood Glucose checks as needed.  Exercise Prescription Changes:   Exercise Prescription Changes     Row Name 07/15/23 1500 07/23/23 1100 08/07/23 1100 08/19/23 1100 09/05/23 1400     Response to Exercise   Blood Pressure (Admit) 100/56 122/68 110/58 102/58 120/58   Blood Pressure (Exercise) 120/60 128/70 124/60 126/62 --   Blood Pressure (Exit) 120/62 122/52 114/62 102/60 116/60   Heart Rate (Admit) 83 bpm 82 bpm 73 bpm 94 bpm 57 bpm   Heart Rate (Exercise) 93 bpm 101 bpm 101 bpm 98 bpm 97 bpm   Heart Rate (Exit) 71 bpm 84 bpm 82 bpm 80 bpm 84 bpm   Oxygen Saturation (Admit) 97 % -- -- -- --   Oxygen Saturation (Exercise) 100 % -- -- -- --   Rating of Perceived Exertion (Exercise) 13 13 13 14 15    Perceived Dyspnea (Exercise) 1 -- -- 0 0   Symptoms None -- none none none   Comments results -- -- -- --   Duration -- Continue with 30 min of aerobic exercise without signs/symptoms of physical distress. Continue with 30 min of aerobic exercise without signs/symptoms of physical distress. Continue with 30 min of aerobic  exercise without signs/symptoms of physical distress. Continue with 30 min of aerobic exercise without signs/symptoms of physical distress.   Intensity -- THRR unchanged THRR unchanged THRR unchanged THRR unchanged     Progression   Progression -- Continue to progress workloads to maintain intensity without signs/symptoms of physical distress. Continue to progress workloads to maintain intensity without signs/symptoms of physical distress.  Continue to progress workloads to maintain intensity without signs/symptoms of physical distress. Continue to progress workloads to maintain intensity without signs/symptoms of physical distress.   Average METs -- 1.93 1.69 1.8 1.8     Resistance Training   Training Prescription -- Yes Yes Yes Yes   Weight -- 3lb 3lb 3lb 2lb   Reps -- 10-15 10-15 10-15 10-15     Interval Training   Interval Training -- -- No No No     NuStep   Level -- 1 1 2  --   Minutes -- 30 15 15  --   METs -- 1.5 1.6 -- --     T5 Nustep   Level -- 1 1  T6 nustep 2  T6 2   SPM -- 80 -- -- --   Minutes -- 30 15 15 15    METs -- 2.3 2.2 1.5 1.7     Biostep-RELP   Level -- -- 1 -- 1   Minutes -- -- 15 -- 15   METs -- -- 2 -- 2     Track   Laps -- -- 13  Hallway 19 20   Minutes -- -- 15 15 15    METs -- -- 1.55 2.03 2.09     Oxygen   Maintain Oxygen Saturation -- -- 88% or higher 88% or higher 88% or higher    Row Name 09/19/23 1500 09/30/23 1400 10/03/23 1400         Response to Exercise   Blood Pressure (Admit) 104/62 -- 102/68     Blood Pressure (Exit) 124/60 -- 122/72     Heart Rate (Admit) 68 bpm -- 61 bpm     Heart Rate (Exercise) 98 bpm -- 108 bpm     Heart Rate (Exit) 76 bpm -- 71 bpm     Rating of Perceived Exertion (Exercise) 15 -- 15     Perceived Dyspnea (Exercise) 0 -- 0     Symptoms none -- none     Duration Continue with 30 min of aerobic exercise without signs/symptoms of physical distress. Continue with 30 min of aerobic exercise without signs/symptoms of physical distress. Continue with 30 min of aerobic exercise without signs/symptoms of physical distress.     Intensity THRR unchanged THRR unchanged THRR unchanged       Progression   Progression Continue to progress workloads to maintain intensity without signs/symptoms of physical distress. Continue to progress workloads to maintain intensity without signs/symptoms of physical distress. Continue to progress workloads to maintain  intensity without signs/symptoms of physical distress.     Average METs 1.9 1.9 1.97       Resistance Training   Training Prescription Yes Yes Yes     Weight 2lb 2lb 2lb     Reps 10-15 10-15 10-15       Interval Training   Interval Training No No No       NuStep   Level 4 4 2      Minutes 15 15 15      METs 1.9 1.9 1.8       T5 Nustep   Level 2  T6 2  T6 2  T6     Minutes 15 15 15      METs 1.6 1.6 1.6       Track   Laps 23 23 29      Minutes 15 15 15      METs 2.25 2.25 2.58       Home Exercise Plan   Plans to continue exercise at -- Home (comment)  walk or use exercise equipment Home (comment)  walk or use exercise equipment     Frequency -- Add 1 additional day to program exercise sessions. Add 1 additional day to program exercise sessions.     Initial Home Exercises Provided -- 09/30/23 09/30/23       Oxygen   Maintain Oxygen Saturation 88% or higher 88% or higher 88% or higher              Exercise Comments:   Exercise Comments     Row Name 07/17/23 1430           Exercise Comments First full day of exercise!  Patient was oriented to gym and equipment including functions, settings, policies, and procedures.  Patient's individual exercise prescription and treatment plan were reviewed.  All starting workloads were established based on the results of the 6 minute walk test done at initial orientation visit.  The plan for exercise progression was also introduced and progression will be customized based on patient's performance and goals.                Exercise Goals and Review:   Exercise Goals     Row Name 07/15/23 1515             Exercise Goals   Increase Physical Activity Yes       Expected Outcomes Short Term: Attend rehab on a regular basis to increase amount of physical activity.;Long Term: Add in home exercise to make exercise part of routine and to increase amount of physical activity.;Long Term: Exercising regularly at least 3-5 days a week.        Increase Strength and Stamina Yes       Expected Outcomes Short Term: Increase workloads from initial exercise prescription for resistance, speed, and METs.;Short Term: Perform resistance training exercises routinely during rehab and add in resistance training at home;Long Term: Improve cardiorespiratory fitness, muscular endurance and strength as measured by increased METs and functional capacity ( )       Able to understand and use rate of perceived exertion (RPE) scale Yes       Intervention Provide education and explanation on how to use RPE scale       Expected Outcomes Short Term: Able to use RPE daily in rehab to express subjective intensity level;Long Term:  Able to use RPE to guide intensity level when exercising independently       Able to understand and use Dyspnea scale Yes       Intervention Provide education and explanation on how to use Dyspnea scale       Expected Outcomes Short Term: Able to use Dyspnea scale daily in rehab to express subjective sense of shortness of breath during exertion;Long Term: Able to use Dyspnea scale to guide intensity level when exercising independently       Knowledge and understanding of Target Heart Rate Range (THRR) Yes       Intervention Provide education and explanation of THRR including how the numbers were predicted and where they are located for reference  Expected Outcomes Short Term: Able to state/look up THRR;Short Term: Able to use daily as guideline for intensity in rehab;Long Term: Able to use THRR to govern intensity when exercising independently       Able to check pulse independently Yes       Intervention Provide education and demonstration on how to check pulse in carotid and radial arteries.;Review the importance of being able to check your own pulse for safety during independent exercise       Expected Outcomes Short Term: Able to explain why pulse checking is important during independent exercise;Long Term: Able to check  pulse independently and accurately       Understanding of Exercise Prescription Yes       Intervention Provide education, explanation, and written materials on patient's individual exercise prescription       Expected Outcomes Short Term: Able to explain program exercise prescription;Long Term: Able to explain home exercise prescription to exercise independently                Exercise Goals Re-Evaluation :  Exercise Goals Re-Evaluation     Row Name 07/17/23 1430 07/23/23 1156 08/07/23 1121 08/19/23 1139 09/05/23 1454     Exercise Goal Re-Evaluation   Exercise Goals Review Increase Physical Activity;Able to understand and use rate of perceived exertion (RPE) scale;Knowledge and understanding of Target Heart Rate Range (THRR);Understanding of Exercise Prescription;Increase Strength and Stamina;Able to check pulse independently Increase Physical Activity;Understanding of Exercise Prescription;Increase Strength and Stamina Increase Physical Activity;Understanding of Exercise Prescription;Increase Strength and Stamina Increase Physical Activity;Understanding of Exercise Prescription;Increase Strength and Stamina Increase Physical Activity;Understanding of Exercise Prescription;Increase Strength and Stamina   Comments Reviewed RPE  and dyspnea scale, THR and program prescription with pt today.  Pt voiced understanding and was given a copy of goals to take home. [name] is off to a good start in the program. He has done the nustep for the entire duration of the aerobic session. We will continue to monitor his progress in the program. [name] is doing well in rehab. He has continued to work at level 1 on the T4 nustep, T6 nustep, and biostep. He also began walking and was able to walk 13 laps in the hallway. We will continue to monitor his progress in the program. [name] is doing well in rehab. He recently has increased his T6 nustep, and T4 nustep level from 1 to 2. He also has been able to walk 19 laps on  the track. We will continue to monitor his progress in the program. [name] is doing well in rehab. He was able to increase his track laps from 19 to 20 in 15 minutes. He continues to do well on the T6 nustep at level 2, and has increased his MET value on the T6. We will continue to monitor his progress in the program.   Expected Outcomes Short: Use RPE daily to regulate intensity.  Long: Follow program prescription in THR. Short: Continue to follow current exercise prescription. Try a nustep machine and a another machine for the aerobic portion of rehab. Long: Continue exercise to improve strength and stamina Short: Try level 2 on the T6 nustep. Long: Continue to increase overall METs and stamina. Short: Continue to follow current exercise prescription, and progressively increase workloads. Long: Continue exercise to improve strength and stamina. Short: Continue to follow current exercise prescription, and progressively increase workloads. Long: Continue exercise to improve strength and stamina.    Row Name 09/19/23 1510 09/30/23 1438 10/03/23 1412  Exercise Goal Re-Evaluation   Exercise Goals Review Increase Physical Activity;Understanding of Exercise Prescription;Increase Strength and Stamina Increase Physical Activity;Able to understand and use rate of perceived exertion (RPE) scale;Knowledge and understanding of Target Heart Rate Range (THRR);Understanding of Exercise Prescription;Increase Strength and Stamina;Able to understand and use Dyspnea scale;Able to check pulse independently Increase Physical Activity;Increase Strength and Stamina;Understanding of Exercise Prescription     Comments [name] continues to do make improvements in rehab. He has recently been able to increase his level on the T4 nustep from level 2 to 4. He was also able to increase his track laps from 20 to 23 in 15 minutes. We will continue to monitor his progress in the program. Reviewed home exercise with pt today.  Pt  plans to walk or use home exercise equipment for exercise.  Reviewed THR, pulse, RPE, sign and symptoms, pulse oximetery and when to call 911 or MD.  Also discussed weather considerations and indoor options.  Pt voiced understanding. [name] continues to do well in rehab. He has recently been able to increase his track laps from 23 to 29 laps. He has also been able to maintain his level on the T6 nustep at level 2. We will continue to monitor his progress in the program.     Expected Outcomes Short: Continue to follow current exercise prescription, and progressively increase workloads. Long: Continue exercise to improve strength and stamina. Short: add 1 day  of exercise outside of class at home following provided guidelines. Long: become independent with exercise routine. Short: Continue to follow current exercise prescription, increase to level 3 on the T6 and T4 nustep. Long: Continue exercise to improve strength and stamina.              Discharge Exercise Prescription (Final Exercise Prescription Changes):  Exercise Prescription Changes - 10/03/23 1400       Response to Exercise   Blood Pressure (Admit) 102/68    Blood Pressure (Exit) 122/72    Heart Rate (Admit) 61 bpm    Heart Rate (Exercise) 108 bpm    Heart Rate (Exit) 71 bpm    Rating of Perceived Exertion (Exercise) 15    Perceived Dyspnea (Exercise) 0    Symptoms none    Duration Continue with 30 min of aerobic exercise without signs/symptoms of physical distress.    Intensity THRR unchanged      Progression   Progression Continue to progress workloads to maintain intensity without signs/symptoms of physical distress.    Average METs 1.97      Resistance Training   Training Prescription Yes    Weight 2lb    Reps 10-15      Interval Training   Interval Training No      NuStep   Level 2    Minutes 15    METs 1.8      T5 Nustep   Level 2   T6   Minutes 15    METs 1.6      Track   Laps 29    Minutes 15    METs  2.58      Home Exercise Plan   Plans to continue exercise at Home (comment)   walk or use exercise equipment   Frequency Add 1 additional day to program exercise sessions.    Initial Home Exercises Provided 09/30/23      Oxygen   Maintain Oxygen Saturation 88% or higher             Nutrition:  Target  Goals: Understanding of nutrition guidelines, daily intake of sodium 1500mg , cholesterol 200mg , calories 30% from fat and 7% or less from saturated fats, daily to have 5 or more servings of fruits and vegetables.  Education: All About Nutrition: -Group instruction provided by verbal, written material, interactive activities, discussions, models, and posters to present general guidelines for heart healthy nutrition including fat, fiber, MyPlate, the role of sodium in heart healthy nutrition, utilization of the nutrition label, and utilization of this knowledge for meal planning. Follow up email sent as well. Written material given at graduation. Flowsheet Row Cardiac Rehab from 07/15/2023 in [facility] Cardiac and Pulmonary Rehab  Education need identified 07/15/23       Biometrics:  Pre Biometrics - 07/15/23 1517       Pre Biometrics   Height 5' 8.6" (1.742 m)    Weight 174 lb (78.9 kg)    Waist Circumference 43.5 inches    Hip Circumference 39.2 inches    Waist to Hip Ratio 1.11 %    BMI (Calculated) 26.01    Single Leg Stand 0 seconds             Post Biometrics - 10/16/23 1552        Post  Biometrics   Height 5' 8.5" (1.74 m)    Weight 172 lb (78 kg)    Waist Circumference 43 inches    Hip Circumference 40 inches    Waist to Hip Ratio 1.08 %    BMI (Calculated) 25.77    Single Leg Stand 0 seconds   did not attempt            Nutrition Therapy Plan and Nutrition Goals:  Nutrition Therapy & Goals - 07/17/23 1426       Nutrition Therapy   Diet Cardiac    Protein (specify units) >75g    Fiber 30 grams    Whole Grain Foods 3 servings    Saturated Fats 15  max. grams    Fruits and Vegetables 5 servings/day    Sodium 2 grams      Personal Nutrition Goals   Nutrition Goal Eat a protein at every meal    Personal Goal #2 Drink 64oz of water daily    Personal Goal #3 Pair protein with a carb    Comments Patient presents with wife. He is drinking ~64oz of water daily. Doesn't drink anything else other than a cup of black coffee in the morning. Spoke to them about setting goal to get at least 64oz consistently. Reviewed mediterranean diet handout, encouraged whole grains, healthy fats, and colorful produce. Spoke to them about nutrient and calorie density. Warned about filling up on low calorie veggies and not having enough room for protein and complex carbs. Wife took notes and asked questions, was able to teach back clearly. Set goal to eat a protein at every meal and to build more balanced meals. Provided some examples and brainstormed small meals and snacks with foods he likes and will eat with focus on ensuring he meets calorie and protein goals to prevent unintended weight loss.      Intervention Plan   Intervention Prescribe, educate and counsel regarding individualized specific dietary modifications aiming towards targeted core components such as weight, hypertension, lipid management, diabetes, heart failure and other comorbidities.;Nutrition handout(s) given to patient.    Expected Outcomes Short Term Goal: Understand basic principles of dietary content, such as calories, fat, sodium, cholesterol and nutrients.;Short Term Goal: A plan has been developed with personal nutrition  goals set during dietitian appointment.;Long Term Goal: Adherence to prescribed nutrition plan.             Nutrition Assessments:  MEDIFICTS Score Key: >=70 Need to make dietary changes  40-70 Heart Healthy Diet <= 40 Therapeutic Level Cholesterol Diet  Flowsheet Row Cardiac Rehab from 07/15/2023 in [facility] Rehab  Picture Your Plate Total Score  on Admission 77      Picture Your Plate Scores: <19 Unhealthy dietary pattern with much room for improvement. 41-50 Dietary pattern unlikely to meet recommendations for good health and room for improvement. 51-60 More healthful dietary pattern, with some room for improvement.  >60 Healthy dietary pattern, although there may be some specific behaviors that could be improved.    Nutrition Goals Re-Evaluation:  Nutrition Goals Re-Evaluation     Row Name 09/19/23 1429             Goals   Comment [name] states that he is continuing to apply dietary guidelines discussed with RD. He is still trying to eat three meals a day, however, he states he does get busy on some days and get only two. He also continues to work on drinking more water and consuming more protein. He said his wife is good at helping him with his nutrition.       Expected Outcome Short: Continue to work on drinking more water. Long: Continue to practice heart healthy butrition as discussed with RD.                Nutrition Goals Discharge (Final Nutrition Goals Re-Evaluation):  Nutrition Goals Re-Evaluation - 09/19/23 1429       Goals   Comment [name] states that he is continuing to apply dietary guidelines discussed with RD. He is still trying to eat three meals a day, however, he states he does get busy on some days and get only two. He also continues to work on drinking more water and consuming more protein. He said his wife is good at helping him with his nutrition.    Expected Outcome Short: Continue to work on drinking more water. Long: Continue to practice heart healthy butrition as discussed with RD.             Psychosocial: Target Goals: Acknowledge presence or absence of significant depression and/or stress, maximize coping skills, provide positive support system. Participant is able to verbalize types and ability to use techniques and skills needed for reducing stress and depression.   Education:  Stress, Anxiety, and Depression - Group verbal and visual presentation to define topics covered.  Reviews how body is impacted by stress, anxiety, and depression.  Also discusses healthy ways to reduce stress and to treat/manage anxiety and depression.  Written material given at graduation. Flowsheet Row Cardiac Rehab from 07/15/2023 in [facility] Cardiac and Pulmonary Rehab  Education need identified 07/15/23       Education: Sleep Hygiene -Provides group verbal and written instruction about how sleep can affect your health.  Define sleep hygiene, discuss sleep cycles and impact of sleep habits. Review good sleep hygiene tips.    Initial Review & Psychosocial Screening:  Initial Psych Review & Screening - 07/04/23 1342       Initial Review   Current issues with Current Stress Concerns    Source of Stress Concerns Unable to participate in former interests or hobbies      Family Dynamics   Good Support System? Yes      Barriers  Psychosocial barriers to participate in program There are no identifiable barriers or psychosocial needs.;The patient should benefit from training in stress management and relaxation.      Screening Interventions   Interventions Encouraged to exercise;Provide feedback about the scores to participant;To provide support and resources with identified psychosocial needs    Expected Outcomes Short Term goal: Utilizing psychosocial counselor, staff and physician to assist with identification of specific Stressors or current issues interfering with healing process. Setting desired goal for each stressor or current issue identified.;Long Term Goal: Stressors or current issues are controlled or eliminated.;Short Term goal: Identification and review with participant of any Quality of Life or Depression concerns found by scoring the questionnaire.;Long Term goal: The participant improves quality of Life and PHQ9 Scores as seen by post scores and/or verbalization of changes              Quality of Life Scores:   Quality of Life - 07/15/23 1526       Quality of Life   Select Quality of Life      Quality of Life Scores   Health/Function Pre 20.14 %    Socioeconomic Pre 30 %    Psych/Spiritual Pre 30 %    Family Pre 30 %    GLOBAL Pre 25.69 %            Scores of 19 and below usually indicate a poorer quality of life in these areas.  A difference of  2-3 points is a clinically meaningful difference.  A difference of 2-3 points in the total score of the Quality of Life Index has been associated with significant improvement in overall quality of life, self-image, physical symptoms, and general health in studies assessing change in quality of life.  PHQ-9: Review Flowsheet       07/15/2023  Depression screen PHQ 2/9  Decreased Interest 0  Down, Depressed, Hopeless 0  PHQ - 2 Score 0  Altered sleeping 0  Tired, decreased energy 2  Change in appetite 1  Feeling bad or failure about yourself  0  Trouble concentrating 3  Moving slowly or fidgety/restless 0  Suicidal thoughts 0  PHQ-9 Score 6  Difficult doing work/chores Somewhat difficult    Details           Interpretation of Total Score  Total Score Depression Severity:  1-4 = Minimal depression, 5-9 = Mild depression, 10-14 = Moderate depression, 15-19 = Moderately severe depression, 20-27 = Severe depression   Psychosocial Evaluation and Intervention:  Psychosocial Evaluation - 07/04/23 1344       Psychosocial Evaluation & Interventions   Interventions Encouraged to exercise with the program and follow exercise prescription    Comments Mr. Malzahn is coming to cardiac rehab after a NSTEMI. He had a stroke 5 years ago that caused a difficulty in word processing, so his wife mainly spoke on the phone during the orientation phone call. His main motivation for coming to the program is to get back to what he had been doing. He has felt weaker since his MI, so he wants to regain his strength back.  His wife and he are worried about him falling because of his weakness, so he is ready to work on his stamina as well. He has a good support system. He reports no sleep concerns or concerns with anxiety or depression.    Expected Outcomes Short: attend cardiac rehab for education and exercise. Long: devleop and maintain positive self care habits    Continue  Psychosocial Services  Follow up required by staff             Psychosocial Re-Evaluation:  Psychosocial Re-Evaluation     Row Name 09/19/23 1427             Psychosocial Re-Evaluation   Current issues with None Identified       Comments [name] reports no current stressors at this time. He does state that he stays busy with appointments but is doing well overall. He states that he has "no trouble sleeping." His wife and family are a good support system for him.       Expected Outcomes Short: Continue to attend cardiac rehab for mental boost. Long: Continue to maintain positive outlook.       Interventions Encouraged to attend Cardiac Rehabilitation for the exercise       Continue Psychosocial Services  Follow up required by staff                Psychosocial Discharge (Final Psychosocial Re-Evaluation):  Psychosocial Re-Evaluation - 09/19/23 1427       Psychosocial Re-Evaluation   Current issues with None Identified    Comments [name] reports no current stressors at this time. He does state that he stays busy with appointments but is doing well overall. He states that he has "no trouble sleeping." His wife and family are a good support system for him.    Expected Outcomes Short: Continue to attend cardiac rehab for mental boost. Long: Continue to maintain positive outlook.    Interventions Encouraged to attend Cardiac Rehabilitation for the exercise    Continue Psychosocial Services  Follow up required by staff             Vocational Rehabilitation: Provide vocational rehab assistance to qualifying candidates.    Vocational Rehab Evaluation & Intervention:  Vocational Rehab - 07/04/23 1342       Initial Vocational Rehab Evaluation & Intervention   Assessment shows need for Vocational Rehabilitation No             Education: Education Goals: Education classes will be provided on a variety of topics geared toward better understanding of heart health and risk factor modification. Participant will state understanding/return demonstration of topics presented as noted by education test scores.  Learning Barriers/Preferences:  Learning Barriers/Preferences - 07/04/23 1341       Learning Barriers/Preferences   Learning Barriers None    Learning Preferences Individual Instruction             General Cardiac Education Topics:  AED/CPR: - Group verbal and written instruction with the use of models to demonstrate the basic use of the AED with the basic ABC's of resuscitation.   Anatomy and Cardiac Procedures: - Group verbal and visual presentation and models provide information about basic cardiac anatomy and function. Reviews the testing methods done to diagnose heart disease and the outcomes of the test results. Describes the treatment choices: Medical Management, Angioplasty, or Coronary Bypass Surgery for treating various heart conditions including Myocardial Infarction, Angina, Valve Disease, and Cardiac Arrhythmias.  Written material given at graduation. Flowsheet Row Cardiac Rehab from 07/15/2023 in [facility] Cardiac and Pulmonary Rehab  Education need identified 07/15/23       Medication Safety: - Group verbal and visual instruction to review commonly prescribed medications for heart and lung disease. Reviews the medication, class of the drug, and side effects. Includes the steps to properly store meds and maintain the prescription regimen.  Written material given at  graduation.   Intimacy: - Group verbal instruction through game format to discuss how heart and lung disease can affect  sexual intimacy. Written material given at graduation..   Know Your Numbers and Heart Failure: - Group verbal and visual instruction to discuss disease risk factors for cardiac and pulmonary disease and treatment options.  Reviews associated critical values for Overweight/Obesity, Hypertension, Cholesterol, and Diabetes.  Discusses basics of heart failure: signs/symptoms and treatments.  Introduces Heart Failure Zone chart for action plan for heart failure.  Written material given at graduation.   Infection Prevention: - Provides verbal and written material to individual with discussion of infection control including proper hand washing and proper equipment cleaning during exercise session. Flowsheet Row Cardiac Rehab from 07/15/2023 in [facility] Cardiac and Pulmonary Rehab  Date 07/15/23  Educator [facility]  Instruction Review Code 1- Verbalizes Understanding       Falls Prevention: - Provides verbal and written material to individual with discussion of falls prevention and safety. Flowsheet Row Cardiac Rehab from 07/15/2023 in [facility] Cardiac and Pulmonary Rehab  Date 07/15/23  Educator [facility]  Instruction Review Code 1- Verbalizes Understanding       Other: -Provides group and verbal instruction on various topics (see comments)   Knowledge Questionnaire Score:  Knowledge Questionnaire Score - 07/15/23 1527       Knowledge Questionnaire Score   Pre Score 22/26             Core Components/Risk Factors/Patient Goals at Admission:  Personal Goals and Risk Factors at Admission - 07/04/23 1340       Core Components/Risk Factors/Patient Goals on Admission   Heart Failure Yes    Intervention Provide a combined exercise and nutrition program that is supplemented with education, support and counseling about heart failure. Directed toward relieving symptoms such as shortness of breath, decreased exercise tolerance, and extremity edema.    Expected Outcomes Improve functional capacity of  life;Short term: Attendance in program 2-3 days a week with increased exercise capacity. Reported lower sodium intake. Reported increased fruit and vegetable intake. Reports medication compliance.;Long term: Adoption of self-care skills and reduction of barriers for early signs and symptoms recognition and intervention leading to self-care maintenance.;Short term: Daily weights obtained and reported for increase. Utilizing diuretic protocols set by physician.    Hypertension Yes    Intervention Provide education on lifestyle modifcations including regular physical activity/exercise, weight management, moderate sodium restriction and increased consumption of fresh fruit, vegetables, and low fat dairy, alcohol moderation, and smoking cessation.;Monitor prescription use compliance.    Expected Outcomes Short Term: Continued assessment and intervention until BP is < 140/55mm HG in hypertensive participants. < 130/21mm HG in hypertensive participants with diabetes, heart failure or chronic kidney disease.;Long Term: Maintenance of blood pressure at goal levels.    Lipids Yes    Intervention Provide education and support for participant on nutrition & aerobic/resistive exercise along with prescribed medications to achieve LDL 70mg , HDL >40mg .    Expected Outcomes Short Term: Participant states understanding of desired cholesterol values and is compliant with medications prescribed. Participant is following exercise prescription and nutrition guidelines.;Long Term: Cholesterol controlled with medications as prescribed, with individualized exercise RX and with personalized nutrition plan. Value goals: LDL < 70mg , HDL > 40 mg.             Education:Diabetes - Individual verbal and written instruction to review signs/symptoms of diabetes, desired ranges of glucose level fasting, after meals and with exercise. Acknowledge that pre and post exercise glucose  checks will be done for 3 sessions at entry of  program.   Core Components/Risk Factors/Patient Goals Review:   Goals and Risk Factor Review     Row Name 09/19/23 1452             Core Components/Risk Factors/Patient Goals Review   Personal Goals Review Weight Management/Obesity;Hypertension       Review [name] states that he checks his BP at home and it has stayed within normal ranges. He also states that he is comfortable with where his weight is at this time and according to his doctor he does not need to lose or gain any weight. He also reports taking all his medications as prescribed.       Expected Outcomes Short: Continue to check BP at home. Long: Continue to monitor lifestyle risk factors.                Core Components/Risk Factors/Patient Goals at Discharge (Final Review):   Goals and Risk Factor Review - 09/19/23 1452       Core Components/Risk Factors/Patient Goals Review   Personal Goals Review Weight Management/Obesity;Hypertension    Review [name] states that he checks his BP at home and it has stayed within normal ranges. He also states that he is comfortable with where his weight is at this time and according to his doctor he does not need to lose or gain any weight. He also reports taking all his medications as prescribed.    Expected Outcomes Short: Continue to check BP at home. Long: Continue to monitor lifestyle risk factors.             ITP Comments:  ITP Comments     Row Name 07/04/23 1344 07/15/23 1549 07/17/23 1429 08/07/23 0923 09/04/23 0947   ITP Comments Initial phone call completed. Diagnosis can be found in [facility] 7/13. EP Orientation scheduled for Monday 8/12 at 10:30. Completed and gym orientation. Initial ITP created and sent for review to Dr. Bethann Punches, Medical Director. First full day of exercise!  Patient was oriented to gym and equipment including functions, settings, policies, and procedures.  Patient's individual exercise prescription and treatment plan were reviewed.  All  starting workloads were established based on the results of the 6 minute walk test done at initial orientation visit.  The plan for exercise progression was also introduced and progression will be customized based on patient's performance and goals. 30 Day review completed. Medical Director ITP review done, changes made as directed, and signed approval by Medical Director.     new to program 30 Day review completed. Medical Director ITP review done, changes made as directed, and signed approval by Medical Director.    Row Name 10/02/23 0835 10/17/23 1539         ITP Comments 30 Day review completed. Medical Director ITP review done, changes made as directed, and signed approval by Medical Director. Vicky graduated today from  rehab with 30 sessions completed.  Details of the patient's exercise prescription and what He needs to do in order to continue the prescription and progress were discussed with patient.  Patient was given a copy of prescription and goals.  Patient verbalized understanding. Jasin plans to continue to exercise by returning to Cardiac Rehab after his valve surgery.               Comments: Discharge ITP

## 2023-10-17 NOTE — Progress Notes (Signed)
Daily Session Note  Patient Details  Name: [name]. MRN: [id number] Date of Birth: 1933-02-07 Referring Provider:    Encounter Date: 10/17/2023  Check In:  Session Check In - 10/17/23 1538       Check-In   Supervising physician immediately available to respond to emergencies See telemetry face sheet for immediately available ER MD    Location [facility]-Cardiac & Pulmonary Rehab    Staff Present Susann Givens, RN BSN;Joseph Hood, RCP,RRT,BSRT;Maxon [place] BS, , Exercise Physiologist;Megan Katrinka Blazing, RN, [place]    Virtual Visit No    Medication changes reported     No    Fall or balance concerns reported    No    Warm-up and Cool-down Performed on first and last piece of equipment    Resistance Training Performed Yes    VAD Patient? No    PAD/SET Patient? No      Pain Assessment   Currently in Pain? No/denies                Social History   Tobacco Use  Smoking Status Never  Smokeless Tobacco Never    Goals Met:  Independence with exercise equipment Exercise tolerated well No report of concerns or symptoms today Strength training completed today  Goals Unmet:  Not Applicable  Comments:  [name] graduated today from  rehab with 30 sessions completed.  Details of the patient's exercise prescription and what He needs to do in order to continue the prescription and progress were discussed with patient.  Patient was given a copy of prescription and goals.  Patient verbalized understanding. [name] plans to continue to exercise by returning to Cardiac Rehab after his valve surgery.   Dr. Bethann Punches is Medical Director for [facility] Cardiac Rehabilitation.  Dr. Vida Rigger is Medical Director for [facility] Pulmonary Rehabilitation.

## 2023-10-17 NOTE — Progress Notes (Signed)
Discharge Summary   [name].  DOB: [birth year]  [name] graduated today from  rehab with 30 sessions completed.  Details of the patient's exercise prescription and what He needs to do in order to continue the prescription and progress were discussed with patient.  Patient was given a copy of prescription and goals.  Patient verbalized understanding. [name] plans to continue to exercise by returning to Cardiac Rehab after his valve surgery.   6 Minute Walk     Row Name 07/15/23 1438 10/16/23 1550       6 Minute Walk   Phase Initial Discharge    Distance 780 feet 715 feet    Distance % Change -- -8 %    Distance Feet Change -- -65 ft    Walk Time 6 minutes 6 minutes    # of Rest Breaks 0 0    MPH 1.5 1.35    METS 1.03 1    RPE 13 15    Perceived Dyspnea  1 0    VO2 Peak 3.6 3.3    Symptoms No No    Resting HR 83 bpm 70 bpm    Resting BP 100/56 118/60    Resting Oxygen Saturation  97 % --    Exercise Oxygen Saturation  during 6 min walk 100 % --    Max Ex. HR 93 bpm 92 bpm    Max Ex. BP 120/60 122/80    2 Minute Post BP 120/62 120/80

## 2023-10-23 DIAGNOSIS — Z952 Presence of prosthetic heart valve: Secondary | ICD-10-CM | POA: Insufficient documentation

## 2023-11-14 ENCOUNTER — Encounter: Payer: Self-pay | Admitting: Physician Assistant

## 2023-11-14 ENCOUNTER — Ambulatory Visit: Payer: Medicare Other | Attending: Physician Assistant | Admitting: Physician Assistant

## 2023-11-14 VITALS — BP 130/80 | HR 73 | Ht 68.0 in | Wt 168.2 lb

## 2023-11-14 DIAGNOSIS — Z8673 Personal history of transient ischemic attack (TIA), and cerebral infarction without residual deficits: Secondary | ICD-10-CM | POA: Diagnosis present

## 2023-11-14 DIAGNOSIS — I10 Essential (primary) hypertension: Secondary | ICD-10-CM

## 2023-11-14 DIAGNOSIS — I471 Supraventricular tachycardia, unspecified: Secondary | ICD-10-CM | POA: Diagnosis present

## 2023-11-14 DIAGNOSIS — Z953 Presence of xenogenic heart valve: Secondary | ICD-10-CM

## 2023-11-14 DIAGNOSIS — E785 Hyperlipidemia, unspecified: Secondary | ICD-10-CM | POA: Diagnosis present

## 2023-11-14 DIAGNOSIS — I480 Paroxysmal atrial fibrillation: Secondary | ICD-10-CM

## 2023-11-14 DIAGNOSIS — I495 Sick sinus syndrome: Secondary | ICD-10-CM | POA: Diagnosis present

## 2023-11-14 DIAGNOSIS — I350 Nonrheumatic aortic (valve) stenosis: Secondary | ICD-10-CM | POA: Diagnosis present

## 2023-11-14 DIAGNOSIS — I2510 Atherosclerotic heart disease of native coronary artery without angina pectoris: Secondary | ICD-10-CM

## 2023-11-14 DIAGNOSIS — I5020 Unspecified systolic (congestive) heart failure: Secondary | ICD-10-CM

## 2023-11-14 NOTE — Patient Instructions (Signed)
Medication Instructions:  Your Physician recommend you continue on your current medication as directed.    *If you need a refill on your cardiac medications before your next appointment, please call your pharmacy*   Lab Work: None ordered at this time    Follow-Up: At [facility], you and your health needs are our priority.  As part of our continuing mission to provide you with exceptional heart care, we have created designated Provider Care Teams.  These Care Teams include your primary Cardiologist (physician) and Advanced Practice Providers (APPs -  Physician Assistants and Nurse Practitioners) who all work together to provide you with the care you need, when you need it.  We recommend signing up for the patient portal called "MyChart".  Sign up information is provided on this After Visit Summary.  MyChart is used to connect with patients for Virtual Visits (Telemedicine).  Patients are able to view lab/test results, encounter notes, upcoming appointments, etc.  Non-urgent messages can be sent to your provider as well.   To learn more about what you can do with MyChart, go to [email or web address].    Your next appointment:   3 month(s)  Provider:   [name], MD

## 2023-11-14 NOTE — Progress Notes (Signed)
Cardiology Office Note    Date:  11/14/2023   ID:  Calvin Shorts [name], DOB Apr 01, 1933, MRN [id number]  PCP:  [name], MD  Cardiologist:  [name], MD  Electrophysiologist:  None   Chief Complaint: Follow up  History of Present Illness:   Calvin Cantalupo. is a 89 y.o. male with history of CAD status post PCI with Taxis stent to the RCA in 2007, HFrEF, PAF diagnosed in 08/2023 with consideration for Watchman, severe aortic stenosis status post TAVR at [facility] on 10/23/2023, PSVT, CVA in 2018 with imaging showing acute left-sided parieto-occipital intraparenchymal hematoma with adjacent subarachnoid hemorrhage felt to represent hemorrhagic transformation of embolic infarct, bleeding ulcer in 01/2023 requiring 2 units PRBC, syncope in 2021 of unclear etiology, mechanical falls, carotid artery stenosis, HTN, and HLD who presents for follow-up of TAVR.  He was admitted in 2018 with CVA as outlined above.  Subsequent prolonged outpatient cardiac monitoring showed no evidence of A-fib at that time with some episodes of tachycardic and bradycardic rates.  Echo from 02/2020 showed an EF of 55% with no regional wall motion abnormalities and grade 1 diastolic dysfunction.  There was mild to moderate aortic valve sclerosis with borderline mild aortic stenosis at that time.  Lexiscan MPI in 03/2020 showed no significant ischemia and was overall low risk.  Repeat outpatient cardiac monitoring in 01/2022 showed sinus rhythm with 156 episodes of SVT lasting up to 19 beats as well as an idioventricular rhythm, frequent PACs, and rare ventricular ectopy.  Repeat outpatient cardiac monitoring in 03/2023 showed a predominant rhythm of sinus with an average rate of 61 bpm with a range of 38 to 146 bpm, 1 run of NSVT lasting 9 beats, 408 episodes of SVT lasting up to 25.4 seconds, frequent PACs with a burden of 7.9%, occasional atrial couplets with a burden of 1.4%, occasional PVCs with a burden  of 1.8%.  He was admitted to the hospital in 06/2023 with NSTEMI with troponin trending to 1300.  Cardiac cath showed severe multivessel CAD with no clear culprit as outlined below.  He was not felt to be a candidate for CABG.  Echo in 06/2023 showed an EF of 35 to 40% with moderate aortic stenosis with a mean gradient of 16 mmHg, peak gradient 25 mmHg, and an estimated valve area 0.8 cm with low-flow, low gradient.  He was later found to be in A-fib by EKG in late 08/2023.  In this setting he was evaluated for watchman at [facility] with recommendation to undergo repair of aortic stenosis initially.  Echo at [facility] in 09/2023 showed EF unchanged at 35% with severe aortic stenosis with a mean gradient 21 mmHg and peak gradient 35 mmHg along with moderate mitral regurgitation.  Plan was to proceed with TAVR.  He underwent TAVR at [facility] on 10/23/2023.  Postprocedure limited echo showed normal functioning aortic valve prosthesis.  He comes in accompanied by his wife who provides most of the history given residual deficits following the patient's CVA.  Patient and wife indicate he has done very well since undergoing TAVR.  His dyspnea and fatigue have resolved.  No frank chest pain.  No dizziness, presyncope, or syncope.  No falls, hematochezia, or melena.  No lower extremity swelling or progressive orthopnea.  He has not needed any as needed furosemide.  He has follow-up with the structural heart team at [facility] next week.  Overall, the patient and wife feel like the patient is doing very well  and do not have any acute cardiac concerns at this time.   Labs independently reviewed: 11/2023 - Hgb 13.7, PLT 179, potassium 4.4, BUN 22, serum creatinine 1.3, albumin 4.5, AST/ALT normal, magnesium 2.2, TSH normal 10/2023 - A1c 5.8 06/2023 - TC 90, TG 30, HDL 40, LDL 44, LP(a) 22  Past Medical History:  Diagnosis Date   Acute embolic stroke (HCC) 02/01/2017   BPH (benign prostatic hyperplasia)    Bradycardia 11/10/2015    Coronary artery disease    Dizziness 08/21/2012   DYSPEPSIA 07/31/2010   Qualifier: Diagnosis of  By: Janee Morn RN, Morrie Sheldon     Hyperlipidemia 05/16/2010   Qualifier: Diagnosis of  By: Mariah Milling MD, [name]      Hypertension    HYPERTENSION, BENIGN 05/16/2010   Qualifier: Diagnosis of  By: Mariah Milling MD, [name]     Stenosis of right carotid artery 02/01/2017    Past Surgical History:  Procedure Laterality Date   ESOPHAGOGASTRODUODENOSCOPY (EGD) WITH PROPOFOL N/A 01/08/2023   Procedure: ESOPHAGOGASTRODUODENOSCOPY (EGD) WITH PROPOFOL;  Surgeon: Midge Minium, MD;  Location: [facility] ENDOSCOPY;  Service: Endoscopy;  Laterality: N/A;   HIP SURGERY     right hip    Intestinal Blockage     LEFT HEART CATH AND CORONARY ANGIOGRAPHY N/A 06/15/2023   Procedure: LEFT HEART CATH AND CORONARY ANGIOGRAPHY;  Surgeon: Marykay Lex, MD;  Location: [facility] INVASIVE CV LAB;  Service: Cardiovascular;  Laterality: N/A;   LOOP RECORDER INSERTION N/A 05/14/2017   Procedure: Loop Recorder Insertion;  Surgeon: Duke Salvia, MD;  Location: [facility] INVASIVE CV LAB;  Service: Cardiovascular;  Laterality: N/A;   SKIN CANCER EXCISION     x2   TONSILLECTOMY      Current Medications: Current Meds  Medication Sig   acetaminophen (TYLENOL) 650 MG CR tablet Take 650 mg by mouth every 8 (eight) hours as needed.    apixaban (ELIQUIS) 2.5 MG TABS tablet Take 1 tablet (2.5 mg total) by mouth 2 (two) times daily.   Ascorbic Acid (VITAMIN C) 1000 MG tablet Take 1,000 mg by mouth daily.   atorvastatin (LIPITOR) 20 MG tablet Take 20 mg by mouth at bedtime.   b complex vitamins tablet Take 1 tablet by mouth daily.   cholecalciferol (VITAMIN D) 1000 units tablet Take 2,000 Units by mouth daily.   clopidogrel (PLAVIX) 75 MG tablet Take 1 tablet (75 mg total) by mouth daily.   cyanocobalamin (VITAMIN B12) 1000 MCG tablet Take 1,000 mcg by mouth daily.   docusate sodium (COLACE) 100 MG capsule Take 100 mg by mouth 2 (two) times daily.   empagliflozin  (JARDIANCE) 25 MG TABS tablet Take 12.5 mg by mouth.   ezetimibe (ZETIA) 10 MG tablet Take 1 tablet (10 mg total) by mouth daily.   FLAXSEED, LINSEED, PO Take 2 g by mouth daily.   furosemide (LASIX) 20 MG tablet Take 20 mg by mouth as needed.   nitroGLYCERIN (NITROSTAT) 0.4 MG SL tablet Place 1 tablet (0.4 mg total) under the tongue every 5 (five) minutes x 3 doses as needed for chest pain.   pantoprazole (PROTONIX) 40 MG tablet Take 1 tablet (40 mg total) by mouth 2 (two) times daily.   potassium chloride (KLOR-CON) 10 MEQ tablet Take 10 mEq by mouth as needed.   pregabalin (LYRICA) 100 MG capsule Take 100 mg by mouth in the morning.   pregabalin (LYRICA) 150 MG capsule Take 150 mg by mouth at bedtime.   Probiotic Product (UP4 PROBIOTICS PO) Take 7.5 mg  by mouth.   Psyllium (GERI-MUCIL) 25 % POWD Take by mouth.   ranolazine (RANEXA) 500 MG 12 hr tablet Take 1 tablet (500 mg total) by mouth 2 (two) times daily.   tamsulosin (FLOMAX) 0.4 MG CAPS capsule Take 2 capsules (0.8 mg total) by mouth daily.   zinc sulfate 220 (50 Zn) MG capsule Take 1 tablet by mouth daily.    Allergies:   Penicillins, Gabapentin, Penicillin g, and Codeine   Social History   Socioeconomic History   Marital status: Married    Spouse name: Not on file   Number of children: Not on file   Years of education: Not on file   Highest education level: Not on file  Occupational History   Not on file  Tobacco Use   Smoking status: Never   Smokeless tobacco: Never  Vaping Use   Vaping status: Never Used  Substance and Sexual Activity   Alcohol use: Yes    Comment: occassionally   Drug use: No   Sexual activity: Not on file  Other Topics Concern   Not on file  Social History Narrative   Not on file   Social Drivers of Health   Financial Resource Strain: Not on file  Food Insecurity: No Food Insecurity (06/17/2023)   Hunger Vital Sign    Worried About Running Out of Food in the Last Year: Never true    Ran  Out of Food in the Last Year: Never true  Transportation Needs: No Transportation Needs (10/23/2023)   Received from [facility] - Transportation    In the past 12 months, has lack of transportation kept you from medical appointments or from getting medications?: No    Lack of Transportation (Non-Medical): No  Physical Activity: Not on file  Stress: Not on file  Social Connections: Not on file     Family History:  The patient's family history includes Cancer in an other family member; Hyperlipidemia in an other family member; Hypertension in his mother and another family member; Other in an other family member.  ROS:   12-point review of systems is negative unless otherwise noted in the HPI.   EKGs/Labs/Other Studies Reviewed:    Studies reviewed were summarized above. The additional studies were reviewed today:  Limited echo 10/23/2023 ([facility]): CONCLUSION -------------------------------------------------------------------------------  LEFT VENTRICULAR SYSTOLIC FUNCTION NOT ASSESSED LVH Not Assessed  ESTIMATED EF: N/A  DIASTOLIC FUNCTION NOT ASSESSED  RIGHT VENTRICULAR SYSTOLIC FUNCTION NOT ASSESSED  VALVULAR REGURGITATION: No AR  VALVULAR STENOSIS: TAVR  ------------------------------------------------------------------------------------------  S/P TAVR  WELL SEATED 26mm TRANSCATH RESILIA ULTRA THV CM  NO OBVIOUS AORTIC REGURGITATION  __________  2D echo 09/05/2023 ([facility]): ECHOCARDIOGRAPHIC DESCRIPTIONS -----------------------------------------------  AORTIC ROOT          Size: Normal    Dissection: INDETERM FOR DISSECTION   AORTIC VALVE      Leaflets: Tricuspid             Morphology: SEVERELY THICKENED      Mobility: PARTIALLY MOBILE   LEFT VENTRICLE                                      Anterior: HYPOCONTRACTILE          Size: Normal  Lateral: HYPOCONTRACTILE   Contraction: MOD GLOBAL DECREASE                      Septal: HYPOCONTRACTILE    Closest EF: 35% (Estimated)  Calc.EF: 36% (2D)      Apical: HYPOCONTRACTILE     LV masses: No Masses                             Inferior: HYPOCONTRACTILE           LVH: MILD LVH                             Posterior: HYPOCONTRACTILE  Dias.FxClass: INDETERMINATE   MITRAL VALVE      Leaflets: Normal                  Mobility: Fully mobile    Morphology: Normal   LEFT ATRIUM          Size: SEVERELY ENLARGED     LA masses: No masses                Normal IAS   MAIN PA          Size: Not seen   PULMONIC VALVE    Morphology: Normal      Mobility: Fully Mobile   RIGHT VENTRICLE          Size: MILDLY ENLARGED           Free wall: Normal   Contraction: Normal                    RV masses: No Masses         TAPSE:   1.2 cm,  Normal Range [>= 1.6 cm]   TRICUSPID VALVE      Leaflets: Normal                  Mobility: Fully mobile    Morphology: Normal   RIGHT ATRIUM          Size: MILDLY ENLARGED            RA Other: None     RA masses: No masses   PERICARDIUM        Fluid: No effusion   INFERIOR VENACAVA          Size: Normal     ABNORMAL RESPIRATORY COLLAPSE   DOPPLER ECHO and OTHER SPECIAL PROCEDURES ------------------------------------     Aortic: MILD AR                SEVERE AS      3.0 m/s peak vel   35 mmHg peak grad  21 mmHg mean grad 0.7 cm2 by DOPPLER   LVOT Diam: 2.0 cm. Resting LVOT Vel: 0.7 m/s. Dimensionless Index: 0.23      Mitral: MODERATE MR            No MS     MV Inflow E Vel.= nm* cm/s  MV Annulus E'Vel.= nm* cm/s  E/E'Ratio= nm*   Tricuspid: MILD TR                No TS             2.8 m/s peak TR vel   40 mmHg peak RV pressure   Pulmonary: No PR  No PS       Other:             DEFINITY CONTRAST SHOWS ENHANCED LV BORDERS   INTERPRETATION ---------------------------------------------------------------    MODERATE LV DYSFUNCTION (See above) WITH MILD LVH    NORMAL RIGHT VENTRICULAR SYSTOLIC FUNCTION     VALVULAR REGURGITATION: MILD AR, MODERATE MR, MILD TR    VALVULAR STENOSIS: SEVERE AS    MODERATE MR, EROA=0.2cm2, RVol=87mL    AVA (0.7cm2) AND DIMENSIONLESS INDEX (0.23) SUGGESTS SEVERE AORTIC    STENOSIS IN THE SETTING OF A LOW FLOW STATE INDICATED BY A STROKE VOLUME    INDEX OF 30 mL/m2    MILD TO MODERATE AR     Compared with prior Echo study on 04/11/2023: REDUCED LV FUNCTION  __________  2D echo 06/16/2023: 1. Findings suggestive of LCX territory ischemia/infarct. Left  ventricular ejection fraction, by estimation, is 35 to 40%. Left  ventricular ejection fraction by PLAX is 35 %. The left ventricle has  moderately decreased function. The left ventricle  demonstrates regional wall motion abnormalities (see scoring  diagram/findings for description). Left ventricular diastolic parameters  are indeterminate. There is severe akinesis of the left ventricular,  basal-mid inferior wall, inferolateral wall and  lateral wall.   2. Right ventricular systolic function is mildly reduced. The right  ventricular size is normal. There is normal pulmonary artery systolic  pressure. The estimated right ventricular systolic pressure is 33.8 mmHg.   3. Left atrial size was moderately dilated.   4. The mitral valve is abnormal. Mild mitral valve regurgitation.   5. The aortic valve is tricuspid. Aortic valve regurgitation is not  visualized. Low flow, decreased gradient moderate aortic valve stenosis.  Aortic valve area, by VTI measures 0.82 cm. Aortic valve mean gradient  measures 16.0 mmHg. Aortic valve Vmax  measures 2.50 m/s. Peak gradient 16 mmHg, DI is 0.29.   6. The inferior vena cava is dilated in size with <50% respiratory  variability, suggesting right atrial pressure of 15 mmHg.   Comparison(s): Changes from prior study are noted. 02/25/2020: LVEF 55%,  borderline mild AS -mean gradient 10 mmgHg.  __________  [facility] 06/15/2023:   Ost Cx to Prox Cx lesion is 100% stenosed -> the  following circumflex fills berry but appears to be bridging collaterals.  Prox Cx to Mid Cx lesion is 80% stenosed.   Ost RCA to Prox RCA lesion is 95% stenosed.   Prox RCA stent has 55% in-stent restenosis   Prox RCA to Mid RCA lesion is 99% stenosed. Mid RCA lesion is 70% stenosed.   Dist RCA lesion is 99% stenosed.  TIMI I flow to the distal vessel.   1st Diag lesion is 55% stenosed.   Mid LAD lesion is 65% stenosed.   -----------------------------------   There is mild to moderate left ventricular systolic dysfunction.  The left ventricular ejection fraction is 35-45% by visual estimate. Basal to mid inferior akinesis with inferolateral hypokinesis.  LV end diastolic pressure is mildly elevated.   There is mild aortic valve stenosis.   -----------------------------------   Upon bed availability, the patient will be transferred to [facility] -> he will be placed under the Team C service   Plan will be to restart IV heparin pending decision about appropriate intervention.  Will need to determine the risks of adding antiplatelet agent to his recently started Eliquis based on his history of stroke and intracerebral hemorrhage.   POST-CATH DIAGNOSES Severe multivessel CAD with unclear culprit  lesion:  Patient was chest pain-free, and hemodynamically stable upon completion of the catheterization.  Ostial-proximal LCx subtotal 99 to 100% occlusion with <TIMI I flow Mild diffuse proximal LAD disease with 70% stenosis after short mid vessel diagonal branch, the large Ramus-like first diagonal with focal 50% stenosis Heavily calcified ostial RCA (very difficult to engage) 95% stenosis followed by diffuse 40 to 60% ISR of the proximal to mid vessel stent followed by 99 % subtotal occlusion after the stent crossing major RV marginal branch. Brisk collaterals fill the PDA   Moderate reduced EF with basal to mid inferior essentially akinesis and inferolateral hypokinesis. With an EDP of 17 mmHg.    RECOMMENDATIONS Admit and restart IV heparin 6 hours after Mynx closure High-dose high intensity statin No beta-blocker because history of tachybradycardia With the extent of disease he has, and either the circumflex or the RCA lesion being somewhat difficult I chose to delay attempted intervention.  Would recommend Transfer to [facility] to discuss possible atherectomy PCI of the RCA plus or minus attempting PCI of the circumflex.  Will need discussion between IC cardiologist and possibly CVTS given the fact that there does appear to be LAD lesion as well. As he was chest pain-free and hemodynamically stable, I did not place balloon pump. __________  2D echo 04/11/2023 [facility]): INTERPRETATION  NORMAL LEFT VENTRICULAR SYSTOLIC FUNCTION  NORMAL RIGHT VENTRICULAR SYSTOLIC FUNCTION  MODERATE MITRAL VALVE INSUFFICIENCY  MILD TRICUSPID AND AORTIC VALVE INSUFFICIENCY  MILD-TO-MODERATE AORTIC VALVE STENOSIS  GLS: -15.0 %  __________  Zio patch 03/2023: Normal sinus rhythm Patient had a min HR of 38 bpm, max HR of 146 bpm, and avg HR of 61 bpm.   1 run of Ventricular Tachycardia occurred lasting 9 beats with a max rate of 125 bpm (avg 107 bpm).    408 Supraventricular Tachycardia runs occurred, the run with the fastest interval lasting 5 beats with a max rate of 146 bpm, the longest lasting 25.4 secs with an avg rate of 110 bpm.    Isolated SVEs were frequent (7.9%, 84016), SVE Couplets were occasional (1.4%, 7569), and SVE Triplets were rare (<1.0%, 2046).  Isolated VEs were occasional (1.8%, 19205), VE Couplets were rare (<1.0%, 82), and no VE Triplets were present.  Ventricular Bigeminy and Trigeminy were present.    No patient triggered events recorded __________  Luci Bank patch 02/2023: Normal sinus rhythm Patient had a min HR of 38 bpm, max HR of 135 bpm, and avg HR of 59 bpm.   156 Supraventricular Tachycardia/atrial tachycardia runs occurred, the run with the fastest  interval lasting 8 beats with a max rate of 135 bpm, the longest lasting 19 beats with an avg rate of 100 bpm.   Idioventricular Rhythm was present. Isolated SVEs were frequent (12.4%, [id number]), SVE Couplets were occasional (4.3%, 21968), and SVE Triplets were occasional (1.5%, 5187).   Isolated VEs were rare (<1.0%, 4920), VE Couplets were rare (<1.0%, 163), and VE Triplets were rare (<1.0%, 3). Ventricular Bigeminy and Trigeminy were present.   1 patient triggered event recorded associated with normal sinus rhythm and PACs __________  See CV studies for more remote imaging    EKG:  EKG is ordered today.  The EKG ordered today demonstrates sinus rhythm, 73 bpm, left axis deviation, PACs, nonspecific IVCD, consistent with prior tracings  Recent Labs: 01/08/2023: TSH 9.197 05/11/2023: Magnesium 2.3 06/26/2023: B Natriuretic Peptide 460.1 08/25/2023: ALT 17; BUN 22; Creatinine, Ser 1.44; Hemoglobin 13.2; Platelets 170; Potassium 4.0; Sodium  132  Recent Lipid Panel    Component Value Date/Time   CHOL 90 06/16/2023 0154   TRIG 30 06/16/2023 0154   TRIG 93 01/23/2010 0000   HDL 40 (L) 06/16/2023 0154   CHOLHDL 2.3 06/16/2023 0154   VLDL 6 06/16/2023 0154   LDLCALC 44 06/16/2023 0154    PHYSICAL EXAM:    VS:  BP 130/80 (BP Location: Left Arm, Patient Position: Sitting)   Pulse 73   Ht 5\' 8"  (1.727 m)   Wt 168 lb 3.2 oz (76.3 kg)   SpO2 98%   BMI 25.57 kg/m   BMI: Body mass index is 25.57 kg/m.  Physical Exam Vitals reviewed.  Constitutional:      Appearance: He is well-developed.  HENT:     Head: Normocephalic and atraumatic.  Eyes:     General:        Right eye: No discharge.        Left eye: No discharge.  Neck:     Vascular: No JVD.  Cardiovascular:     Rate and Rhythm: Normal rate and regular rhythm.     Pulses:          Posterior tibial pulses are 2+ on the right side and 2+ on the left side.     Heart sounds: Normal heart sounds, S1 normal and S2 normal. Heart  sounds not distant. No midsystolic click and no opening snap. No murmur heard.    No friction rub.  Pulmonary:     Effort: Pulmonary effort is normal. No respiratory distress.     Breath sounds: Normal breath sounds. No decreased breath sounds, wheezing, rhonchi or rales.  Chest:     Chest wall: No tenderness.  Abdominal:     General: There is no distension.  Musculoskeletal:     Cervical back: Normal range of motion.     Right lower leg: No edema.     Left lower leg: No edema.  Skin:    General: Skin is warm and dry.     Nails: There is no clubbing.  Neurological:     Mental Status: He is alert and oriented to person, place, and time.  Psychiatric:        Speech: Speech normal.        Behavior: Behavior normal.        Thought Content: Thought content normal.        Judgment: Judgment normal.     Wt Readings from Last 3 Encounters:  11/14/23 168 lb 3.2 oz (76.3 kg)  10/16/23 172 lb (78 kg)  10/15/23 173 lb 2.1 oz (78.5 kg)     ASSESSMENT & PLAN:   CAD involving native coronary arteries without angina: He is doing well and without symptoms concerning for cardiac decompensation.  Continue aggressive risk factor modification and secondary prevention including clopidogrel 75 mg, atorvastatin 20 mg, ezetimibe 10 mg, and ranolazine 500 mg twice daily.  No longer on beta-blocker secondary to bradycardic rates noted on prior cardiac monitoring.  No indication for further ischemic testing at this time.  HFrEF: He is euvolemic and well compensated.  NYHA class is difficult to assess at this time.  Continue current GDMT including Jardiance 12.5 mg.  No longer on beta-blocker secondary to bradycardic rates.  Historically, relative hypotension has precluded escalation of GDMT.  Would look to revisit this in follow-up with addition of ACE inhibitor/ARB/or ARNI.  Has previously had issues with renal dysfunction, with advanced age, may not be the best candidate  for MRA.  Severe aortic stenosis:  Status post TAVR on 10/23/2023 at [facility].  Limited echo at that time showed normal functioning prosthesis.  Symptoms of dyspnea and fatigue resolved.  Has follow-up with [facility] structural heart team next week.  On apixaban as outlined above.  SBE prophylaxis.  PAF/PSVT/tachy-brady: Maintaining sinus rhythm with ectopy.  No longer on beta-blocker.  Undergoing consideration for pacemaker implantation by [facility] with noted to tacky bradycardic rates.  CHA2DS2-VASc at least 7 (CHF, HTN, age x 2, vascular disease, CVA x 2).  Has been maintained on a reduced dose apixaban 2.5 mg, presumably in the setting of advanced age and prior GI bleed with plans for possible Watchman evaluation.  Will defer dosage of apixaban to primary cardiologist.   HTN: Blood pressure is well-controlled in the office today.  HLD: LDL 44 in 06/2023.  Remains on atorvastatin 20 mg.  History of CVA: No new deficits.  On clopidogrel and apixaban as outlined above.     Disposition: F/u with Dr. Mariah Milling or an APP in 3 months.   Medication Adjustments/Labs and Tests Ordered: Current medicines are reviewed at length with the patient today.  Concerns regarding medicines are outlined above. Medication changes, Labs and Tests ordered today are summarized above and listed in the Patient Instructions accessible in Encounters.   Signed, Eula Listen, PA-C 11/14/2023 9:38 AM     [facility] [address] [place], [place] [address] [phone]

## 2023-12-05 ENCOUNTER — Ambulatory Visit: Payer: Medicare Other | Admitting: Podiatry

## 2023-12-12 NOTE — Progress Notes (Signed)
 Primary care physician:Sparks, Reyes BIRCH, MD  Reason for referral:  This is a 89 y.o. male who presents for evaluation of the following cardiac problems: ischemic cardiomyopathy with HFrEF, PAF and high risk for stroke with high bleeding risk as well for eval of AF management, who now is here post hospitalization for severe aortic stenosis s/p TAVR.  HPI 89 y.o. male  with hx of stroke on plavix  and ASA in 2018.  Hx of Taxus stent in 2018 to RCA presumed.  Hx of bleeding ulcer in 2024 Feb with 2 units of transfusion.  Monitor in Apr of 24 with no AF but fast and low HR.  CHADSVASC OF 6     07/12/23 Had MI in July.  Had CP.  Some pain on Mon and Tue.  None on Wed-Fri.  On Sat had bad pain and 911 called..  second Trop I was 1324.  BMP with BUN 17 and 1.3   BNP 590 on discharge.  Repeat a week later 460. Medically managed. He notes no CP.  No DOE.  No swelling.  Limited in walking secondary to lack of energy.  Wife notes if sits in chair hard to get up.  Starts rehab on Monday.  No orthopnea.  Some dizziness standing.  No falls.  \\   08/13/23 - orthostatic, dc'd MRA. Restarted atorva 20. Stopped imdur  (BP room)    09/05/23 -  1 fall 2 weeks ago, believed to be mechanical. Tolerating atorvastatin  well. Continues to have positional LH. Denies CP, has not used nitro. Stable 2 pillow orthopnea, no PND. No weight gain or need for PRN lasix .    12/12/23- Presents for follow up with his wife, who assists with most of history. He was admitted 10/23/23 for TAVR (26 mm Edwards SAPIEN S3 UR, [place]/ [place]). He underwent temp perm placement for CHB immediately following the procedure, but did not require permanent pacemaker. Metoprolol  was discontinued (however wife notes that he actually has not been taking for several months). Two-week holter monitor after discharge demonstrated predominantly sinus rhythm with HR range 41-127, 3 episodes NSVT, ventricular ectopy burden2%, 3% afib burden with no RVR. He feels well all  things considered. Energy levels and dyspnea are much improved after TAVR. He walks a couple blocks per day and will start cardiac rehab next week. No chest pain / pressure/ discomfort, palpitations, leg swelling, lightheadedness. Weight is stable at 166-168 lbs, without need for PRN lasix . Having some nosebleeds only when blowing nose, described as mild and once per week at most. No other bleeding.   ---------------------------------------------------------------------------------------------------------------------------------------   Echo 5/9 - mild AS,  moderate MR  mild TR   EF 55%.  (Pre MI)   Echo post MI 7/24 Findings suggestive of LCX territory ischemia/infarct. Left ventricular ejection fraction, by estimation, is 35 to 40%. Left ventricular ejection fraction by PLAX is 35 %. The left ventricle has moderately decreased function. The left ventricle  demonstrates regional wall motion abnormalities (see scoring diagram/findings for description). Left ventricular diastolic parameters are indeterminate. There is severe akinesis of the left ventricular, basal-mid inferior wall, inferolateral wall and  lateral wall.   2. Right ventricular systolic function is mildly reduced. The right ventricular size is normal. There is normal pulmonary artery systolic pressure. The estimated right ventricular systolic pressure is 33.8 mmHg.   3. Left atrial size was moderately dilated.   4. The mitral valve is abnormal. Mild mitral valve regurgitation.   5. The aortic valve is tricuspid. Aortic valve regurgitation  is not visualized. Low flow, decreased gradient moderate aortic valve stenosis. Aortic valve area, by VTI measures 0.82 cm. Aortic valve mean gradient measures 16.0 mmHg. Aortic valve Vmax  measures 2.50 m/s. Peak gradient 16 mmHg, DI is 0.29.   6. The inferior vena cava is dilated in size with <50% respiratory variability, suggesting right atrial pressure of 15 mmHg.    Cath -   Ost Cx to Prox Cx  lesion is 100% stenosed -> the following circumflex  fills berry but appears to be bridging collaterals.  Prox Cx to Mid Cx  lesion is 80% stenosed.  SABRA Pais RCA to Prox RCA lesion is 95% stenosed.  .  Prox RCA stent has 55% in-stent restenosis  .  Prox RCA to Mid RCA lesion is 99% stenosed. Mid RCA lesion is 70%  stenosed.  . Dist RCA lesion is 99% stenosed.  TIMI I flow to the distal vessel.  .  1st Diag lesion is 55% stenosed.  .  Mid LAD lesion is 65% stenosed.  EDP 17     Transfer to [facility] to discuss possible  atherectomy PCI of the RCA plus or minus attempting PCI of the circumflex.    Post MI follow up Cards  Admitted to the hospital with chest pain May 11, 2023 Troponin 1324 Cardiac catheterization performed June 15, 2023 Severe multivessel coronary disease with unclear culprit Ostial-proximal LCx subtotal 99 to 100% occlusion with <TIMI I flow Mild diffuse proximal LAD disease with 70% stenosis after short mid vessel diagonal branch, the large Ramus-like first diagonal with focal 50% stenosis Heavily calcified ostial RCA (very difficult to engage) 95% stenosis followed by diffuse 40 to 60% ISR of the proximal to mid vessel stent followed by 99 % subtotal occlusion after the stent crossing major RV marginal branch. Brisk collaterals fill the PDA  Moderate reduced EF with basal to mid inferior essentially akinesis and inferolateral hypokinesis.  Transfer to [facility] Chest [facility]-free at [facility] Left circumflex felt to be subtotal, very small in caliber, diffuse moderate disease of mid LAD and large diagonal with heavy calcification (area of new wall motion abn)  RCA with critical ostial and mid vessel disease with stents between with moderate in-stent restenosis, entire RCA severely calcified    ECHO 09/05/23, pre-TAVR INTERPRETATION ---------------------------------------------------------------   MODERATE LV DYSFUNCTION (See above) WITH MILD LVH   NORMAL RIGHT VENTRICULAR  SYSTOLIC FUNCTION   VALVULAR REGURGITATION: MILD AR, MODERATE MR, MILD TR   VALVULAR STENOSIS: SEVERE AS   MODERATE MR, EROA=0.2cm2, RVol=18mL   AVA (0.7cm2) AND DIMENSIONLESS INDEX (0.23) SUGGESTS SEVERE AORTIC   STENOSIS IN THE SETTING OF A LOW FLOW STATE INDICATED BY A STROKE VOLUME   INDEX OF 30 mL/m2   MILD TO MODERATE AR   Compared with prior Echo study on 04/11/2023: REDUCED LV FUNCTION  Echo limited, 10/23/23: LEFT VENTRICULAR SYSTOLIC FUNCTION NOT ASSESSED LVH Not Assessed ESTIMATED EF: N/A DIASTOLIC FUNCTION NOT ASSESSED RIGHT VENTRICULAR SYSTOLIC FUNCTION NOT ASSESSED VALVULAR REGURGITATION: No AR VALVULAR STENOSIS: TAVR S/P TAVR WELL SEATED 26mm TRANSCATH RESILIA ULTRA THV CM  NO OBVIOUS AORTIC REGURGITATION   14-day holter monitor, 10/25/23: IMPRESSION: *The predominant rhythm was Sinus with Frequent Supraventricular Ectopy. *The Maximum Heart Rate recorded was 127 bpm, 11/28 15:02:18, the Minimum Heart Rate recorded was 41 bpm, 11/28 10:29:47, and the Average Heart Rate was 76 bpm. *There were 16,327  VE beats with a burden of 2 %. There were 3 occurrences of Ventricular Tachycardia with the  Fastest episode 116 bpm, 11/26 10:47:08, and the Longest episode 8 beats, 11/23 02:45:50. *There were 129,510 SVE beats with a burden of 15 %. There were 3,450  occurrences of Supraventricular Tachycardia with the Fastest episode 127 bpm, 11/28 15:02:16, and the Longest episode 14 beats, 11/27 03:58:32. *The study included an Atrial Fibrillation/Flutter Burden of 3 % with 0 % in RVR and 14 % in SVR. The longest  episode was 1h 60m 55.7s, 11/22 20:12:21, and the Fastest episode was 98 bpm, 11/23 17:23:03. *Other rhythms in this study include: Ventricular Run. *There were 3 Patient Triggers.  Patient Active Problem List  Diagnosis  . History of nonmelanoma skin cancer  . AK (actinic keratosis)  . DDD (degenerative disc disease), cervical  . Peripheral neuropathy  . Lower GI bleed   . Hypertension  . Hyperlipidemia  . Intention tremor  . Benign prostatic hyperplasia  . ED (erectile dysfunction)  . History of [facility] spotted fever  . Small bowel obstruction (CMS/HHS-HCC)  . Vertigo  . Abnormal CXR (chest x-ray)  . ASCVD (arteriosclerotic cardiovascular disease)  . Syncope and collapse  . Pseudophakia, right eye  . Pseudophakia, left eye  . Essential tremor  . Osteoarthritis of spine with radiculopathy, lumbar region  . Lumbar stenosis with neurogenic claudication  . Lumbar radiculitis  . Unstable balance  . Personal history of other malignant neoplasm of skin  . Parkinsonism (CMS/HHS-HCC)  . Nontraumatic subcortical hemorrhage of left cerebral hemisphere (CMS/HHS-HCC)  . Essential hypertension  . Acute embolic stroke (CMS/HHS-HCC)  . Bradycardia  . Coronary atherosclerosis of native coronary artery  . Dyspepsia and other specified disorders of function of stomach  . Femoral neck fracture (CMS/HHS-HCC)  . Stenosis of right carotid artery  . Dementia without behavioral disturbance (CMS/HHS-HCC)  . Closed fracture of right hip with routine healing  . Cerebral infarction (CMS/HHS-HCC)  . Exposure to [employer]  . Mononeuritis  . Low testosterone  . Iron deficiency anemia due to chronic blood loss  . Paroxysmal atrial fibrillation (CMS/HHS-HCC)  . Congestive heart failure, NYHA class II, chronic, systolic (CMS/HHS-HCC)  . Chronic anticoagulation: Eliquis   . History of non-ST elevation myocardial infarction (NSTEMI)  . Presence of stent in coronary artery  . Long term current use of clopidogrel   . CVA (cerebral vascular accident) (CMS/HHS-HCC)  . GERD (gastroesophageal reflux disease)  . HFrEF (heart failure with reduced ejection fraction) (CMS/HHS-HCC)  . Ischemic cardiomyopathy  . S/P TAVR (transcatheter aortic valve replacement)    Current Outpatient Medications  Medication Sig Dispense Refill  . apixaban  (ELIQUIS ) 2.5 mg tablet Take 1  tablet (2.5 mg total) by mouth 2 (two) times daily    . ascorbic acid, vitamin C , (VITAMIN C ) 1000 MG tablet Take 1,000 mg by mouth once daily    . atorvastatin  (LIPITOR) 20 MG tablet Take 1 tablet (20 mg total) by mouth once daily 30 tablet 11  . cholecalciferol  (VITAMIN D3) 1,000 unit capsule Take 2 capsules (2,000 Units total) by mouth once daily. 30 capsule 0  . clopidogreL  (PLAVIX ) 75 mg tablet Take 75 mg by mouth once daily    . cyanocobalamin  (VITAMIN B12) 1000 MCG tablet Take 1,000 mcg by mouth once daily    . docusate (COLACE) 100 MG capsule Take 1 capsule (100 mg total) by mouth 2 (two) times daily 60 capsule 0  . empagliflozin (JARDIANCE) 10 mg tablet Take 1 tablet (10 mg total) by mouth once daily    . ezetimibe  (ZETIA ) 10 mg tablet  Take 10 mg by mouth once daily.    . FUROsemide  (LASIX ) 20 MG tablet Take 1 tablet (20 mg total) by mouth once daily as needed for Edema (Or weight gain >178 lbs) 30 tablet 2  . Herbal Supplement Herbal Name: Flax seed    . pantoprazole  (PROTONIX ) 40 MG DR tablet Take 40 mg by mouth once daily    . polyethylene glycol (MIRALAX) packet Take 1 packet (17 g total) by mouth once daily as needed for Constipation Mix in 4-8ounces of fluid prior to taking.    . potassium chloride  (KLOR-CON ) 10 MEQ ER tablet Take 10 mEq by mouth as directed (when taking lasix )    . pregabalin  (LYRICA ) 100 MG capsule Take 1 capsule (100 mg total) by mouth 2 (two) times daily Takes 100 mg in am and 150 mg at night (Patient taking differently: Take 150 mg by mouth once daily  150 mg once daily) 90 capsule 3  . psyllium husk, with sugar, (METAMUCIL) 3.4 gram/12 gram oral powder Take 1.7 g by mouth once daily Mix with a full glass (240 mL) of fluid.    . ranolazine  (RANEXA ) 500 MG ER tablet Take 500 mg by mouth 2 (two) times daily    . sennosides-docusate (SENOKOT-S) 8.6-50 mg tablet Take 1 tablet by mouth 2 (two) times daily as needed for Constipation    . tamsulosin  (FLOMAX ) 0.4 mg  capsule Take 2 capsules (0.8 mg total) by mouth once daily. Take 30 minutes after same meal each day. 30 capsule 0  . vitamin B complex (B COMPLEX 1 ORAL) Take 1 capsule by mouth once daily.    . zinc  sulfate (ZINC -15 ORAL) Take 1 tablet by mouth once daily Takes 30 mg zinc  tablet    . acetaminophen  (TYLENOL ) 650 MG ER tablet Take 1,000 mg by mouth every 8 (eight) hours as needed for Pain    . flaxseed oil Oil Take 2 Tbsp by mouth once daily Flax seeds not oil   (Patient not taking: Reported on 12/12/2023)    . metoprolol  SUCCinate (TOPROL -XL) 25 MG XL tablet Take 0.5 tablets (12.5 mg total) by mouth once daily 45 tablet 3   No current facility-administered medications for this visit.    Allergies  Allergen Reactions  . Penicillins Rash      Rash  . Codeine Nausea, Vomiting and Nausea And Vomiting    Other reaction(s): Vomiting    Past Medical History:  Diagnosis Date  . ASCVD (arteriosclerotic cardiovascular disease)    s/p PTCA with stent placement  . Basal cell carcinoma   . Benign prostatic hyperplasia   . Bradycardia 11/10/2015   Last Assessment & Plan:   Asymptomatic bradycardia, likely exacerbated by propranolol  being used for tremors.  Suggested he monitor his heart rate especially with any symptoms of fatigue, lightheadedness, exercise intolerance.    . CHB (complete heart block) (CMS/HHS-HCC) 10/24/2023  . Chronic anticoagulation: Eliquis  10/10/2023  . CVA (cerebral vascular accident) (CMS/HHS-HCC)   . DDD (degenerative disc disease), cervical   . ED (erectile dysfunction)   . Exposure to [employer]   . GERD (gastroesophageal reflux disease)    no meds   only when eats too much  . HFrEF (heart failure with reduced ejection fraction) (CMS/HHS-HCC)   . History of non-ST elevation myocardial infarction (NSTEMI) 10/10/2023  . History of [facility] spotted fever   . Hyperlipidemia   . Hypertension   . Intention tremor   . Ischemic cardiomyopathy   . Long  term  current use of clopidogrel  10/10/2023  . Lower GI bleed    due to hemorrhoids  . Nontraumatic subcortical hemorrhage of left cerebral hemisphere (CMS/HHS-HCC) 01/06/2017  . Parkinsonism (CMS/HHS-HCC) 12/27/2016   mild    . Paroxysmal atrial fibrillation (CMS/HHS-HCC) 06/10/2023  . Peripheral neuropathy    with chronic foot pain  . Presence of stent in coronary artery 10/10/2023  . Small bowel obstruction (CMS/HHS-HCC)   . Squamous Cell Carcinoma   . Syncope and collapse 04/10/2011   Last Assessment & Plan:   No recent episodes of syncope or near syncope. No further workup. We'll try not to push his blood pressure aggressively low to avoid symptoms    . Temporary transvenous cardiac pacemaker present 10/24/2023   ON 10/23/2023 for CHB by Dr. Pierrette    . Temporary transvenous cardiac pacemaker present 10/24/2023   ON 10/23/2023 for CHB by Dr. Pierrette   . Vertigo   . Vision abnormalities     Past Surgical History:  Procedure Laterality Date  . EXTRACTION CATARACT EXTRACAPSULAR W/INSERTION INTRAOCULAR PROSTHESIS Right 12/22/2014   Procedure: R - EXTRACTION CATARACT EXTRACAPSULAR WITH PHACO WITH INSERTION INTRAOCULAR PROSTHESIS;  Surgeon: Latisha Caro Martyr, MD;  Location: [facility] OR;  Service: Ophthalmology;  Laterality: Right;  . EXTRACTION CATARACT EXTRACAPSULAR W/INSERTION INTRAOCULAR PROSTHESIS Left 01/12/2015   Procedure: L--EXTRACTION CATARACT EXTRACAPSULAR WITH PHACO WITH INSERTION INTRAOCULAR PROSTHESIS;  Surgeon: Latisha Caro Martyr, MD;  Location: [facility] OR;  Service: Ophthalmology;  Laterality: Left;  . Right Hip surgery   10/2018  . TRANSCATHETER AORTIC VALVE REPLACEMENT N/A 10/23/2023   Procedure: TRANSCATHETER AORTIC VALVE REPLACEMENT (SAPIEN) WITH PROSTHETIC VALVE; PERCUTANEOUS FEMORAL ARTERY APPROACH, S3;  Surgeon: Ricky Reyes Prude, MD;  Location: [facility] OPERATING ROOMS;  Service: Cardiothoracic;  Laterality: N/A;  . PTCA     with stent placement  . Surgical repair of  bowel obstruction    . TONSILLECTOMY      Social History   Socioeconomic History  . Marital status: Married    Spouse name: Nena  . Number of children: 2  . Years of education: 16  . Highest education level: Bachelor's degree (e.g., BA, AB, BS)  Occupational History  . Occupation: Retired - Real estate  Tobacco Use  . Smoking status: Never  . Smokeless tobacco: Never  Vaping Use  . Vaping status: Never Used  Substance and Sexual Activity  . Alcohol use: Not Currently    Comment: maybe 1-2 times a week- 2 glasses at dinner  . Drug use: Never  . Sexual activity: Defer    Partners: Female    Comment: married for 48 years  Social History Narrative   He is a engineer, maintenance (it).  He was in the [employer] after college and now works part-time in [employer].  He does not smoke or use any recreational drugs.  He occasionally has a drink with dinner.  He is married and has a son and daughter were in good health.  He lives with his wife in [place], [place]    Social Drivers of Health   Food Insecurity: No Food Insecurity (06/17/2023)   [facility]   Hunger Vital Sign   . Worried About Programme Researcher, Broadcasting/film/video in the Last Year: Never true   . Ran Out of Food in the Last Year: Never true  Transportation Needs: No Transportation Needs (10/23/2023)   PRAPARE - Transportation   . Lack of Transportation (Medical): No   . Lack of Transportation (Non-Medical): No  Housing Stability: Unknown (12/12/2023)   Housing Stability Vital Sign   . Homeless in the Last Year: No    Family History  Problem Relation Name Age of Onset  . Cataracts Mother    . Heart failure Mother    . Hyperlipidemia (Elevated cholesterol) Mother    . High blood pressure (Hypertension) Mother    . High blood pressure (Hypertension) Other    . Prostate cancer Other    . Alzheimer's disease Other    . Anesthesia problems Father    . Heart failure Father    . Alzheimer's disease Father    .  No Known Problems Daughter 4   . No Known Problems Son 2   . Glaucoma Neg Hx    . Macular degeneration Neg Hx       REVIEW OF SYSTEMS:  Gen -  no wt loss or wt gain, no fatigue, no F/C/S.   HEENT - no eye problems, yes hearing problems, noswallowing problems. Neck - no thyroid problem, no  neck swelling. Lungs - no cough, no sputum, no wheezing. CV - See HPI and no  palpitations, no  syncope. Abd - no reflux, no fullness, no constipation, no diarrhea. no GI bleeding GU - no difficulty urinating.  Nocturia times n/a  no hematuria. Ext - no edema, no myalgia, no joint pain that limits exertion. Neuro -yes confusion, no weakness, no problems with speech.   Skin -no bruising.   no rash   PHYSICAL EXAMINATION:  BP 118/68 (BP Location: Left upper arm, Patient Position: Sitting, BP Cuff Size: Adult)   Pulse 78   Ht 172.7 cm (5' 8)   Wt 77 kg (169 lb 12.8 oz)   SpO2 96%   BMI 25.82 kg/m   General appearance  doesn't appear chronically ill HE DEFERS TO HIS WIFE TO ANSWER QUESTIONS.  CLEARLY WITH SOME COGNITIVE IMPAIRMENT   Head Mouth and Eye exam  Normocephalic, without obvious abnormality, atraumatic  Dentition is good  Neck exam  Thyroid: n/a  Veins: elevated jugular venous pressure  Carotids : normal pulsation bilaterally  Nodes: no obvious adenopathy   Lungs:  Breath Sounds: clear to auscultation, no wheezes, rales, or rhonchi  Percussion: n/a  Cadiovascular:  Palpation:   No heaves  Auscultation:   S1:normal   S2: normal   Clicks: no   Rub: no   Murmurs: II/VI systolic   LV gallop : None   RV gallop: None  Abdomen:  Liver enlargement: no  Pulsatile liver: no  Pulsatile aorta: no  Ascites: no  Abdominal bruit no:    Peripheral exam:  Cyanosis: no  Clubbing: no  Edema: no  Pulses: normal  Femoral Bruits: no  Amputation: no  Skin  normal  Neuro exam  Some word finding difficulty, at baseline.  LABORATORIES:   Recent Labs    04/30/23 1708  06/10/23 0800 07/12/23 0940 07/22/23 0740 10/23/23 1404 10/24/23 0252 10/25/23 0946 11/06/23 1500 11/18/23 1028  NA 133*   < > 135   < > 133*  134* 134* 136 133* 139  K 4.7   < > 4.6   < > 3.6 4.2 3.9 4.4 5.0  CL 103   < > 101   < > 102 107 104 97 102  CO2 23   < > 28   < > 20* 21 22 29.7 27  BUN 22*   < > 20   < > 21* 18 12 22 15   CREATININE 1.3   < >  1.4*   < > 1.3 1.1 1.1 1.3 1.3  PROBNP 834  --  3,796*  --   --   --   --   --   --   MG  --   --   --   --  1.9 2.3 2.0 2.2  --    < > = values in this interval not displayed.    Assessment and Plan  No orders of the defined types were placed in this encounter.  HFrEF (heart failure with reduced ejection fraction) (CMS/HHS-HCC)  (primary encounter diagnosis) Plan: metoprolol  SUCCinate (TOPROL -XL) 25 MG XL        tablet  S/P TAVR (transcatheter aortic valve replacement)  Chronic anticoagulation: Eliquis   Paroxysmal atrial fibrillation (CMS/HHS-HCC)  Mixed hyperlipidemia   Severe AS - Underwent TAVR Nov 2024. Will follow up with [facility] in Nov 2025 with echo at that time.    ECHO POST WITH GOOD SEATING OF VALVE AND NO AI.  EF WAS 35% PRE OP.     HFrEF - JVP elevated today. Continue empagliflozin. Will resume low dose metoprolol  in an effort to better optimize GDMT. Follow up in 6-8 weeks and anticipate starting spironolactone 12.5 mg Mon/Wed/Fri at that time, with recheck BMP one week later. Would prioritize MRA over ACEi/ARB/ARNI, which will hopefully improve symptoms and volume status. Can consider ICD at follow up once GDMT better optimized, although unclear if he is best candidate given competing risks.   I DO NOT FAVOR ICD IN PERSONS IN THIS AGE GROUP.  FURTHER ISSUES OF COGNITIVE DECLINE.  PUSH ON HIS GDMT AND REPEAT ECHO SOME TIME AFTER MRA ADDED.  HE IS CLINICALLY DOING MUCH BETTER. NOT ORTHOSTATIC TODAY.     PAF - Sinus arrhythmia again today. Apixaban  appropriately reduced for age. No recent falls, and minimal  bleeding (infrequent episodes of epistaxis upon blowing nose), so will defer Watchman evaluation. Metoprolol  resumed as above.  IN SINUS WITH SINUS ARRHY.  FOLLOW    CAD, MI - apix + plavix . BB, ranolazine . Will start cardiac rehab next week.  No chest pain.   FOLLOW   TRY TO STOP PLAVIX  SOON.    Lipids - continue atovastatin 20 + ezetimibe . Last LDL 43.  AT GOALS.  Last Lipids: Lab Results  Component Value Date   CHOLTOTAL 104 06/10/2023   LDLCALC 43 06/10/2023   HDL 46.5 06/10/2023   TRIG 75 06/10/2023     RTC:  6 weeks APP 6 months [place]  Areatha Don, M.D. Cardiology Fellow, PGY-5 [facility] 12/12/2023   Attestation Statement:   I personally saw and evaluated the patient, and participated in the management and treatment plan as documented in the resident/fellow note.  OZELL DELENA HAYS, MD  BILLING BASED ON COMPLEXITY

## 2023-12-17 ENCOUNTER — Encounter: Payer: Medicare Other | Attending: Internal Medicine | Admitting: *Deleted

## 2023-12-17 DIAGNOSIS — Z7901 Long term (current) use of anticoagulants: Secondary | ICD-10-CM | POA: Insufficient documentation

## 2023-12-17 DIAGNOSIS — I5020 Unspecified systolic (congestive) heart failure: Secondary | ICD-10-CM | POA: Insufficient documentation

## 2023-12-17 DIAGNOSIS — Z48812 Encounter for surgical aftercare following surgery on the circulatory system: Secondary | ICD-10-CM | POA: Insufficient documentation

## 2023-12-17 DIAGNOSIS — Z952 Presence of prosthetic heart valve: Secondary | ICD-10-CM | POA: Insufficient documentation

## 2023-12-17 NOTE — Progress Notes (Signed)
 Initial phone call completed. Diagnosis can be found in [facility] 12/12/23. EP Orientation scheduled for Wednesday 1/15 at 1pm.

## 2023-12-18 VITALS — Ht 68.4 in | Wt 174.1 lb

## 2023-12-18 DIAGNOSIS — Z7901 Long term (current) use of anticoagulants: Secondary | ICD-10-CM | POA: Diagnosis not present

## 2023-12-18 DIAGNOSIS — Z952 Presence of prosthetic heart valve: Secondary | ICD-10-CM

## 2023-12-18 DIAGNOSIS — Z48812 Encounter for surgical aftercare following surgery on the circulatory system: Secondary | ICD-10-CM | POA: Diagnosis present

## 2023-12-18 DIAGNOSIS — R7303 Prediabetes: Secondary | ICD-10-CM | POA: Insufficient documentation

## 2023-12-18 NOTE — Progress Notes (Signed)
 Cardiac Individual Treatment Plan  Patient Details  Name: [name]. MRN: [id number] Date of Birth: May 13, 1933 Referring Provider:   Flowsheet Row Cardiac Rehab from 12/18/2023 in [facility] Cardiac and Pulmonary Rehab  Referring Provider Shary Deems, MD       Initial Encounter Date:  Flowsheet Row Cardiac Rehab from 12/18/2023 in [facility] Cardiac and Pulmonary Rehab  Date 12/18/23       Visit Diagnosis: S/P TAVR (transcatheter aortic valve replacement)  Patient's Home Medications on Admission:  Current Outpatient Medications:    acetaminophen  (TYLENOL ) 650 MG CR tablet, Take 650 mg by mouth every 8 (eight) hours as needed. , Disp: , Rfl:    apixaban  (ELIQUIS ) 2.5 MG TABS tablet, Take 1 tablet (2.5 mg total) by mouth 2 (two) times daily., Disp: 180 tablet, Rfl: 3   Ascorbic Acid (VITAMIN C ) 1000 MG tablet, Take 1,000 mg by mouth daily., Disp: , Rfl:    atorvastatin  (LIPITOR) 20 MG tablet, Take 20 mg by mouth at bedtime., Disp: , Rfl:    b complex vitamins tablet, Take 1 tablet by mouth daily., Disp: , Rfl:    cholecalciferol  (VITAMIN D ) 1000 units tablet, Take 2,000 Units by mouth daily., Disp: , Rfl:    clopidogrel  (PLAVIX ) 75 MG tablet, Take 1 tablet (75 mg total) by mouth daily., Disp: 90 tablet, Rfl: 0   cyanocobalamin  (VITAMIN B12) 1000 MCG tablet, Take 1,000 mcg by mouth daily., Disp: , Rfl:    docusate sodium  (COLACE) 100 MG capsule, Take 100 mg by mouth 2 (two) times daily., Disp: , Rfl:    empagliflozin (JARDIANCE) 25 MG TABS tablet, Take 12.5 mg by mouth., Disp: , Rfl:    ezetimibe  (ZETIA ) 10 MG tablet, Take 1 tablet (10 mg total) by mouth daily., Disp: 90 tablet, Rfl: 3   FLAXSEED, LINSEED, PO, Take 2 g by mouth daily., Disp: , Rfl:    furosemide  (LASIX ) 20 MG tablet, Take 20 mg by mouth as needed., Disp: , Rfl:    metoprolol  succinate (TOPROL -XL) 25 MG 24 hr tablet, Take 12.5 mg by mouth., Disp: , Rfl:    nitroGLYCERIN  (NITROSTAT ) 0.4 MG SL tablet, Place 1  tablet (0.4 mg total) under the tongue every 5 (five) minutes x 3 doses as needed for chest pain., Disp: 25 tablet, Rfl: 0   pantoprazole  (PROTONIX ) 40 MG tablet, Take 1 tablet (40 mg total) by mouth 2 (two) times daily., Disp: 60 tablet, Rfl: 1   potassium chloride  (KLOR-CON ) 10 MEQ tablet, Take 10 mEq by mouth as needed., Disp: , Rfl:    pregabalin  (LYRICA ) 100 MG capsule, Take 100 mg by mouth in the morning., Disp: , Rfl:    pregabalin  (LYRICA ) 150 MG capsule, Take 150 mg by mouth at bedtime., Disp: , Rfl:    Probiotic Product (UP4 PROBIOTICS PO), Take 7.5 mg by mouth., Disp: , Rfl:    Psyllium (GERI-MUCIL) 25 % POWD, Take by mouth., Disp: , Rfl:    ranolazine  (RANEXA ) 500 MG 12 hr tablet, Take 1 tablet (500 mg total) by mouth 2 (two) times daily., Disp: 180 tablet, Rfl: 3   tamsulosin  (FLOMAX ) 0.4 MG CAPS capsule, Take 2 capsules (0.8 mg total) by mouth daily., Disp: 30 capsule, Rfl: 0   zinc  sulfate 220 (50 Zn) MG capsule, Take 1 tablet by mouth daily., Disp: , Rfl:   Past Medical History: Past Medical History:  Diagnosis Date   Acute embolic stroke (HCC) 02/01/2017   BPH (benign prostatic hyperplasia)    Bradycardia  11/10/2015   Coronary artery disease    Dizziness 08/21/2012   DYSPEPSIA 07/31/2010   Qualifier: Diagnosis of  By: Hildy Lowers, RN, Ashley     Hyperlipidemia 05/16/2010   Qualifier: Diagnosis of  By: Jerelene Monday MD, [name]      Hypertension    HYPERTENSION, BENIGN 05/16/2010   Qualifier: Diagnosis of  By: Jerelene Monday MD, [name]     Stenosis of right carotid artery 02/01/2017    Tobacco Use: Social History   Tobacco Use  Smoking Status Never  Smokeless Tobacco Never    Labs: Review Flowsheet       Latest Ref Rng & Units 01/23/2010 06/15/2023 06/16/2023  Labs for ITP Cardiac and Pulmonary Rehab  Cholestrol 0 - 200 mg/dL 621  - 90   LDL (calc) 0 - 99 mg/dL 30.8  - 44   HDL-C >65 mg/dL 78.4  - 40   Trlycerides <150 mg/dL 93  - 30   Hemoglobin O9G 4.8 - 5.6 % 5.4  6.3  6.3  -     Details       Multiple values from one day are sorted in reverse-chronological order          Exercise Target Goals: Exercise Program Goal: Individual exercise prescription set using results from initial 6 min walk test and THRR while considering  patient's activity barriers and safety.   Exercise Prescription Goal: Initial exercise prescription builds to 30-45 minutes a day of aerobic activity, 2-3 days per week.  Home exercise guidelines will be given to patient during program as part of exercise prescription that the participant will acknowledge.   Education: Aerobic Exercise: - Group verbal and visual presentation on the components of exercise prescription. Introduces F.I.T.T principle from ACSM for exercise prescriptions.  Reviews F.I.T.T. principles of aerobic exercise including progression. Written material given at graduation. Flowsheet Row Cardiac Rehab from 12/18/2023 in [facility] Cardiac and Pulmonary Rehab  Education need identified 12/18/23       Education: Resistance Exercise: - Group verbal and visual presentation on the components of exercise prescription. Introduces F.I.T.T principle from ACSM for exercise prescriptions  Reviews F.I.T.T. principles of resistance exercise including progression. Written material given at graduation.    Education: Exercise & Equipment Safety: - Individual verbal instruction and demonstration of equipment use and safety with use of the equipment. Flowsheet Row Cardiac Rehab from 12/18/2023 in [facility] Cardiac and Pulmonary Rehab  Date 12/18/23  Educator MB  Instruction Review Code 1- Verbalizes Understanding       Education: Exercise Physiology & General Exercise Guidelines: - Group verbal and written instruction with models to review the exercise physiology of the cardiovascular system and associated critical values. Provides general exercise guidelines with specific guidelines to those with heart or lung disease.  Flowsheet Row Cardiac  Rehab from 12/18/2023 in [facility] Cardiac and Pulmonary Rehab  Education need identified 12/18/23       Education: Flexibility, Balance, Mind/Body Relaxation: - Group verbal and visual presentation with interactive activity on the components of exercise prescription. Introduces F.I.T.T principle from ACSM for exercise prescriptions. Reviews F.I.T.T. principles of flexibility and balance exercise training including progression. Also discusses the mind body connection.  Reviews various relaxation techniques to help reduce and manage stress (i.e. Deep breathing, progressive muscle relaxation, and visualization). Balance handout provided to take home. Written material given at graduation.   Activity Barriers & Risk Stratification:  Activity Barriers & Cardiac Risk Stratification - 12/18/23 1503       Activity Barriers & Cardiac Risk Stratification  Activity Barriers Arthritis;Back Problems;Balance Concerns;Muscular Weakness    Cardiac Risk Stratification High             6 Minute Walk:  6 Minute Walk     Row Name 07/15/23 1438 10/16/23 1550 12/18/23 1501     6 Minute Walk   Phase Initial Discharge Initial   Distance 780 feet 715 feet 805 feet   Distance % Change -- -8 % --   Distance Feet Change -- -65 ft --   Walk Time 6 minutes 6 minutes 6 minutes   # of Rest Breaks 0 0 0   MPH 1.5 1.35 1.52   METS 1.03 1 1.16   RPE 13 15 11    Perceived Dyspnea  1 0 0   VO2 Peak 3.6 3.3 4.06   Symptoms No No No   Resting HR 83 bpm 70 bpm 64 bpm   Resting BP 100/56 118/60 146/86   Resting Oxygen Saturation  97 % -- 97 %   Exercise Oxygen Saturation  during 6 min walk 100 % -- 96 %   Max Ex. HR 93 bpm 92 bpm 90 bpm   Max Ex. BP 120/60 122/80 130/60   2 Minute Post BP 120/62 120/80 120/64            Oxygen Initial Assessment:   Oxygen Re-Evaluation:   Oxygen Discharge (Final Oxygen Re-Evaluation):   Initial Exercise Prescription:  Initial Exercise Prescription - 12/18/23 1500        Date of Initial Exercise RX and Referring Provider   Date 12/18/23    Referring Provider Shary Deems, MD      Oxygen   Maintain Oxygen Saturation 88% or higher      Recumbant Bike   Level 1    RPM 50    Watts 15    Minutes 15    METs 1.16      NuStep   Level 1    SPM 80    Minutes 15    METs 1.16      Biostep-RELP   Level 1    Minutes 15    METs 1.16      Track   Laps 21    Minutes 15    METs 1.16      Prescription Details   Frequency (times per week) 2    Duration Progress to 30 minutes of continuous aerobic without signs/symptoms of physical distress      Intensity   THRR 40-80% of Max Heartrate 90-118    Ratings of Perceived Exertion 11-13    Perceived Dyspnea 0-4      Progression   Progression Continue to progress workloads to maintain intensity without signs/symptoms of physical distress.      Resistance Training   Training Prescription Yes    Weight 2lb    Reps 10-15             Perform Capillary Blood Glucose checks as needed.  Exercise Prescription Changes:   Exercise Prescription Changes     Row Name 07/15/23 1500 07/23/23 1100 08/07/23 1100 08/19/23 1100 09/05/23 1400     Response to Exercise   Blood Pressure (Admit) 100/56 122/68 110/58 102/58 120/58   Blood Pressure (Exercise) 120/60 128/70 124/60 126/62 --   Blood Pressure (Exit) 120/62 122/52 114/62 102/60 116/60   Heart Rate (Admit) 83 bpm 82 bpm 73 bpm 94 bpm 57 bpm   Heart Rate (Exercise) 93 bpm 101 bpm 101 bpm 98 bpm 97 bpm  Heart Rate (Exit) 71 bpm 84 bpm 82 bpm 80 bpm 84 bpm   Oxygen Saturation (Admit) 97 % -- -- -- --   Oxygen Saturation (Exercise) 100 % -- -- -- --   Rating of Perceived Exertion (Exercise) 13 13 13 14 15    Perceived Dyspnea (Exercise) 1 -- -- 0 0   Symptoms None -- none none none   Comments results -- -- -- --   Duration -- Continue with 30 min of aerobic exercise without signs/symptoms of physical distress. Continue with 30 min of  aerobic exercise without signs/symptoms of physical distress. Continue with 30 min of aerobic exercise without signs/symptoms of physical distress. Continue with 30 min of aerobic exercise without signs/symptoms of physical distress.   Intensity -- THRR unchanged THRR unchanged THRR unchanged THRR unchanged     Progression   Progression -- Continue to progress workloads to maintain intensity without signs/symptoms of physical distress. Continue to progress workloads to maintain intensity without signs/symptoms of physical distress. Continue to progress workloads to maintain intensity without signs/symptoms of physical distress. Continue to progress workloads to maintain intensity without signs/symptoms of physical distress.   Average METs -- 1.93 1.69 1.8 1.8     Resistance Training   Training Prescription -- Yes Yes Yes Yes   Weight -- 3lb 3lb 3lb 2lb   Reps -- 10-15 10-15 10-15 10-15     Interval Training   Interval Training -- -- No No No     NuStep   Level -- 1 1 2  --   Minutes -- 30 15 15  --   METs -- 1.5 1.6 -- --     T5 Nustep   Level -- 1 1  T6 nustep 2  T6 2   SPM -- 80 -- -- --   Minutes -- 30 15 15 15    METs -- 2.3 2.2 1.5 1.7     Biostep-RELP   Level -- -- 1 -- 1   Minutes -- -- 15 -- 15   METs -- -- 2 -- 2     Track   Laps -- -- 13  Hallway 19 20   Minutes -- -- 15 15 15    METs -- -- 1.55 2.03 2.09     Oxygen   Maintain Oxygen Saturation -- -- 88% or higher 88% or higher 88% or higher    Row Name 09/19/23 1500 09/30/23 1400 10/03/23 1400 12/18/23 1500       Response to Exercise   Blood Pressure (Admit) 104/62 -- 102/68 146/86    Blood Pressure (Exercise) -- -- -- 130/60    Blood Pressure (Exit) 124/60 -- 122/72 120/64    Heart Rate (Admit) 68 bpm -- 61 bpm 64 bpm    Heart Rate (Exercise) 98 bpm -- 108 bpm 90 bpm    Heart Rate (Exit) 76 bpm -- 71 bpm 62 bpm    Oxygen Saturation (Admit) -- -- -- 97 %    Oxygen Saturation (Exercise) -- -- -- 96 %    Oxygen  Saturation (Exit) -- -- -- 96 %    Rating of Perceived Exertion (Exercise) 15 -- 15 11    Perceived Dyspnea (Exercise) 0 -- 0 0    Symptoms none -- none none    Comments -- -- -- results    Duration Continue with 30 min of aerobic exercise without signs/symptoms of physical distress. Continue with 30 min of aerobic exercise without signs/symptoms of physical distress. Continue with 30 min of  aerobic exercise without signs/symptoms of physical distress. Progress to 30 minutes of  aerobic without signs/symptoms of physical distress    Intensity THRR unchanged THRR unchanged THRR unchanged THRR New      Progression   Progression Continue to progress workloads to maintain intensity without signs/symptoms of physical distress. Continue to progress workloads to maintain intensity without signs/symptoms of physical distress. Continue to progress workloads to maintain intensity without signs/symptoms of physical distress. Continue to progress workloads to maintain intensity without signs/symptoms of physical distress.    Average METs 1.9 1.9 1.97 1.16      Resistance Training   Training Prescription Yes Yes Yes --    Weight 2lb 2lb 2lb --    Reps 10-15 10-15 10-15 --      Interval Training   Interval Training No No No --      NuStep   Level 4 4 2  --    Minutes 15 15 15  --    METs 1.9 1.9 1.8 --      T5 Nustep   Level 2  T6 2  T6 2  T6 --    Minutes 15 15 15  --    METs 1.6 1.6 1.6 --      Track   Laps 23 23 29  --    Minutes 15 15 15  --    METs 2.25 2.25 2.58 --      Home Exercise Plan   Plans to continue exercise at -- Home (comment)  walk or use exercise equipment Home (comment)  walk or use exercise equipment --    Frequency -- Add 1 additional day to program exercise sessions. Add 1 additional day to program exercise sessions. --    Initial Home Exercises Provided -- 09/30/23 09/30/23 --      Oxygen   Maintain Oxygen Saturation 88% or higher 88% or higher 88% or higher --              Exercise Comments:   Exercise Comments     Row Name 07/17/23 1430           Exercise Comments First full day of exercise!  Patient was oriented to gym and equipment including functions, settings, policies, and procedures.  Patient's individual exercise prescription and treatment plan were reviewed.  All starting workloads were established based on the results of the 6 minute walk test done at initial orientation visit.  The plan for exercise progression was also introduced and progression will be customized based on patient's performance and goals.                Exercise Goals and Review:   Exercise Goals     Row Name 07/15/23 1515 12/18/23 1507           Exercise Goals   Increase Physical Activity Yes Yes      Intervention -- Provide advice, education, support and counseling about physical activity/exercise needs.;Develop an individualized exercise prescription for aerobic and resistive training based on initial evaluation findings, risk stratification, comorbidities and participant's personal goals.      Expected Outcomes Short Term: Attend rehab on a regular basis to increase amount of physical activity.;Long Term: Add in home exercise to make exercise part of routine and to increase amount of physical activity.;Long Term: Exercising regularly at least 3-5 days a week. Short Term: Attend rehab on a regular basis to increase amount of physical activity.;Long Term: Add in home exercise to make exercise part of routine and to increase amount  of physical activity.;Long Term: Exercising regularly at least 3-5 days a week.      Increase Strength and Stamina Yes Yes      Intervention -- Provide advice, education, support and counseling about physical activity/exercise needs.;Develop an individualized exercise prescription for aerobic and resistive training based on initial evaluation findings, risk stratification, comorbidities and participant's personal goals.       Expected Outcomes Short Term: Increase workloads from initial exercise prescription for resistance, speed, and METs.;Short Term: Perform resistance training exercises routinely during rehab and add in resistance training at home;Long Term: Improve cardiorespiratory fitness, muscular endurance and strength as measured by increased METs and functional capacity ( ) Short Term: Increase workloads from initial exercise prescription for resistance, speed, and METs.;Short Term: Perform resistance training exercises routinely during rehab and add in resistance training at home;Long Term: Improve cardiorespiratory fitness, muscular endurance and strength as measured by increased METs and functional capacity ( )      Able to understand and use rate of perceived exertion (RPE) scale Yes Yes      Intervention Provide education and explanation on how to use RPE scale Provide education and explanation on how to use RPE scale      Expected Outcomes Short Term: Able to use RPE daily in rehab to express subjective intensity level;Long Term:  Able to use RPE to guide intensity level when exercising independently Short Term: Able to use RPE daily in rehab to express subjective intensity level;Long Term:  Able to use RPE to guide intensity level when exercising independently      Able to understand and use Dyspnea scale Yes Yes      Intervention Provide education and explanation on how to use Dyspnea scale Provide education and explanation on how to use Dyspnea scale      Expected Outcomes Short Term: Able to use Dyspnea scale daily in rehab to express subjective sense of shortness of breath during exertion;Long Term: Able to use Dyspnea scale to guide intensity level when exercising independently Short Term: Able to use Dyspnea scale daily in rehab to express subjective sense of shortness of breath during exertion;Long Term: Able to use Dyspnea scale to guide intensity level when exercising independently      Knowledge  and understanding of Target Heart Rate Range (THRR) Yes Yes      Intervention Provide education and explanation of THRR including how the numbers were predicted and where they are located for reference Provide education and explanation of THRR including how the numbers were predicted and where they are located for reference      Expected Outcomes Short Term: Able to state/look up THRR;Short Term: Able to use daily as guideline for intensity in rehab;Long Term: Able to use THRR to govern intensity when exercising independently Short Term: Able to state/look up THRR;Short Term: Able to use daily as guideline for intensity in rehab;Long Term: Able to use THRR to govern intensity when exercising independently      Able to check pulse independently Yes Yes      Intervention Provide education and demonstration on how to check pulse in carotid and radial arteries.;Review the importance of being able to check your own pulse for safety during independent exercise Provide education and demonstration on how to check pulse in carotid and radial arteries.;Review the importance of being able to check your own pulse for safety during independent exercise      Expected Outcomes Short Term: Able to explain why pulse checking is important during independent exercise;Long Term:  Able to check pulse independently and accurately Short Term: Able to explain why pulse checking is important during independent exercise;Long Term: Able to check pulse independently and accurately      Understanding of Exercise Prescription Yes Yes      Intervention Provide education, explanation, and written materials on patient's individual exercise prescription Provide education, explanation, and written materials on patient's individual exercise prescription      Expected Outcomes Short Term: Able to explain program exercise prescription;Long Term: Able to explain home exercise prescription to exercise independently Short Term: Able to explain  program exercise prescription;Long Term: Able to explain home exercise prescription to exercise independently               Exercise Goals Re-Evaluation :  Exercise Goals Re-Evaluation     Row Name 07/17/23 1430 07/23/23 1156 08/07/23 1121 08/19/23 1139 09/05/23 1454     Exercise Goal Re-Evaluation   Exercise Goals Review Increase Physical Activity;Able to understand and use rate of perceived exertion (RPE) scale;Knowledge and understanding of Target Heart Rate Range (THRR);Understanding of Exercise Prescription;Increase Strength and Stamina;Able to check pulse independently Increase Physical Activity;Understanding of Exercise Prescription;Increase Strength and Stamina Increase Physical Activity;Understanding of Exercise Prescription;Increase Strength and Stamina Increase Physical Activity;Understanding of Exercise Prescription;Increase Strength and Stamina Increase Physical Activity;Understanding of Exercise Prescription;Increase Strength and Stamina   Comments Reviewed RPE  and dyspnea scale, THR and program prescription with pt today.  Pt voiced understanding and was given a copy of goals to take home. Sanjith is off to a good start in the program. He has done the nustep for the entire duration of the aerobic session. We will continue to monitor his progress in the program. Zai is doing well in rehab. He has continued to work at level 1 on the T4 nustep, T6 nustep, and biostep. He also began walking and was able to walk 13 laps in the hallway. We will continue to monitor his progress in the program. Shrihaan is doing well in rehab. He recently has increased his T6 nustep, and T4 nustep level from 1 to 2. He also has been able to walk 19 laps on the track. We will continue to monitor his progress in the program. Carina is doing well in rehab. He was able to increase his track laps from 19 to 20 in 15 minutes. He continues to do well on the T6 nustep at level 2, and has increased his MET value on the  T6. We will continue to monitor his progress in the program.   Expected Outcomes Short: Use RPE daily to regulate intensity.  Long: Follow program prescription in THR. Short: Continue to follow current exercise prescription. Try a nustep machine and a another machine for the aerobic portion of rehab. Long: Continue exercise to improve strength and stamina Short: Try level 2 on the T6 nustep. Long: Continue to increase overall METs and stamina. Short: Continue to follow current exercise prescription, and progressively increase workloads. Long: Continue exercise to improve strength and stamina. Short: Continue to follow current exercise prescription, and progressively increase workloads. Long: Continue exercise to improve strength and stamina.    Row Name 09/19/23 1510 09/30/23 1438 10/03/23 1412         Exercise Goal Re-Evaluation   Exercise Goals Review Increase Physical Activity;Understanding of Exercise Prescription;Increase Strength and Stamina Increase Physical Activity;Able to understand and use rate of perceived exertion (RPE) scale;Knowledge and understanding of Target Heart Rate Range (THRR);Understanding of Exercise Prescription;Increase Strength and Stamina;Able to understand and  use Dyspnea scale;Able to check pulse independently Increase Physical Activity;Increase Strength and Stamina;Understanding of Exercise Prescription     Comments Berge continues to do make improvements in rehab. He has recently been able to increase his level on the T4 nustep from level 2 to 4. He was also able to increase his track laps from 20 to 23 in 15 minutes. We will continue to monitor his progress in the program. Reviewed home exercise with pt today.  Pt plans to walk or use home exercise equipment for exercise.  Reviewed THR, pulse, RPE, sign and symptoms, pulse oximetery and when to call 911 or MD.  Also discussed weather considerations and indoor options.  Pt voiced understanding. Abi continues to do well  in rehab. He has recently been able to increase his track laps from 23 to 29 laps. He has also been able to maintain his level on the T6 nustep at level 2. We will continue to monitor his progress in the program.     Expected Outcomes Short: Continue to follow current exercise prescription, and progressively increase workloads. Long: Continue exercise to improve strength and stamina. Short: add 1 day  of exercise outside of class at home following provided guidelines. Long: become independent with exercise routine. Short: Continue to follow current exercise prescription, increase to level 3 on the T6 and T4 nustep. Long: Continue exercise to improve strength and stamina.              Discharge Exercise Prescription (Final Exercise Prescription Changes):  Exercise Prescription Changes - 12/18/23 1500       Response to Exercise   Blood Pressure (Admit) 146/86    Blood Pressure (Exercise) 130/60    Blood Pressure (Exit) 120/64    Heart Rate (Admit) 64 bpm    Heart Rate (Exercise) 90 bpm    Heart Rate (Exit) 62 bpm    Oxygen Saturation (Admit) 97 %    Oxygen Saturation (Exercise) 96 %    Oxygen Saturation (Exit) 96 %    Rating of Perceived Exertion (Exercise) 11    Perceived Dyspnea (Exercise) 0    Symptoms none    Comments results    Duration Progress to 30 minutes of  aerobic without signs/symptoms of physical distress    Intensity THRR New      Progression   Progression Continue to progress workloads to maintain intensity without signs/symptoms of physical distress.    Average METs 1.16             Nutrition:  Target Goals: Understanding of nutrition guidelines, daily intake of sodium 1500mg , cholesterol 200mg , calories 30% from fat and 7% or less from saturated fats, daily to have 5 or more servings of fruits and vegetables.  Education: All About Nutrition: -Group instruction provided by verbal, written material, interactive activities, discussions, models, and  posters to present general guidelines for heart healthy nutrition including fat, fiber, MyPlate, the role of sodium in heart healthy nutrition, utilization of the nutrition label, and utilization of this knowledge for meal planning. Follow up email sent as well. Written material given at graduation. Flowsheet Row Cardiac Rehab from 12/18/2023 in [facility] Cardiac and Pulmonary Rehab  Education need identified 12/18/23       Biometrics:  Pre Biometrics - 12/18/23 1508       Pre Biometrics   Height 5' 8.4" (1.737 m)    Weight 174 lb 1.6 oz (79 kg)    Waist Circumference 42.5 inches    Hip Circumference 38.5  inches    Waist to Hip Ratio 1.1 %    BMI (Calculated) 26.17    Single Leg Stand 0.5 seconds             Post Biometrics - 10/16/23 1552        Post  Biometrics   Height 5' 8.5" (1.74 m)    Weight 172 lb (78 kg)    Waist Circumference 43 inches    Hip Circumference 40 inches    Waist to Hip Ratio 1.08 %    BMI (Calculated) 25.77    Single Leg Stand 0 seconds   did not attempt            Nutrition Therapy Plan and Nutrition Goals:  Nutrition Therapy & Goals - 12/18/23 1509       Nutrition Therapy   RD appointment deferred Yes      Personal Nutrition Goals   Nutrition Goal RD appointment deferred      Intervention Plan   Intervention Prescribe, educate and counsel regarding individualized specific dietary modifications aiming towards targeted core components such as weight, hypertension, lipid management, diabetes, heart failure and other comorbidities.;Nutrition handout(s) given to patient.    Expected Outcomes Short Term Goal: Understand basic principles of dietary content, such as calories, fat, sodium, cholesterol and nutrients.             Nutrition Assessments:  MEDIFICTS Score Key: >=70 Need to make dietary changes  40-70 Heart Healthy Diet <= 40 Therapeutic Level Cholesterol Diet  Flowsheet Row Cardiac Rehab from 12/18/2023 in [facility] Cardiac and  Pulmonary Rehab  Picture Your Plate Total Score on Admission 67      Picture Your Plate Scores: <14 Unhealthy dietary pattern with much room for improvement. 41-50 Dietary pattern unlikely to meet recommendations for good health and room for improvement. 51-60 More healthful dietary pattern, with some room for improvement.  >60 Healthy dietary pattern, although there may be some specific behaviors that could be improved.    Nutrition Goals Re-Evaluation:  Nutrition Goals Re-Evaluation     Row Name 09/19/23 1429             Goals   Comment Shahrukh states that he is continuing to apply dietary guidelines discussed with RD. He is still trying to eat three meals a day, however, he states he does get busy on some days and get only two. He also continues to work on drinking more water and consuming more protein. He said his wife is good at helping him with his nutrition.       Expected Outcome Short: Continue to work on drinking more water. Long: Continue to practice heart healthy butrition as discussed with RD.                Nutrition Goals Discharge (Final Nutrition Goals Re-Evaluation):  Nutrition Goals Re-Evaluation - 09/19/23 1429       Goals   Comment Jarrah states that he is continuing to apply dietary guidelines discussed with RD. He is still trying to eat three meals a day, however, he states he does get busy on some days and get only two. He also continues to work on drinking more water and consuming more protein. He said his wife is good at helping him with his nutrition.    Expected Outcome Short: Continue to work on drinking more water. Long: Continue to practice heart healthy butrition as discussed with RD.  Psychosocial: Target Goals: Acknowledge presence or absence of significant depression and/or stress, maximize coping skills, provide positive support system. Participant is able to verbalize types and ability to use techniques and skills needed for  reducing stress and depression.   Education: Stress, Anxiety, and Depression - Group verbal and visual presentation to define topics covered.  Reviews how body is impacted by stress, anxiety, and depression.  Also discusses healthy ways to reduce stress and to treat/manage anxiety and depression.  Written material given at graduation. Flowsheet Row Cardiac Rehab from 07/15/2023 in [facility] Cardiac and Pulmonary Rehab  Education need identified 07/15/23       Education: Sleep Hygiene -Provides group verbal and written instruction about how sleep can affect your health.  Define sleep hygiene, discuss sleep cycles and impact of sleep habits. Review good sleep hygiene tips.    Initial Review & Psychosocial Screening:  Initial Psych Review & Screening - 12/17/23 1305       Initial Review   Current issues with Current Stress Concerns    Source of Stress Concerns Unable to participate in former interests or hobbies;Unable to perform yard/household activities      Family Dynamics   Good Support System? Yes   wife and family     Barriers   Psychosocial barriers to participate in program There are no identifiable barriers or psychosocial needs.;The patient should benefit from training in stress management and relaxation.      Screening Interventions   Interventions Encouraged to exercise;Provide feedback about the scores to participant;To provide support and resources with identified psychosocial needs    Expected Outcomes Short Term goal: Utilizing psychosocial counselor, staff and physician to assist with identification of specific Stressors or current issues interfering with healing process. Setting desired goal for each stressor or current issue identified.;Long Term Goal: Stressors or current issues are controlled or eliminated.;Short Term goal: Identification and review with participant of any Quality of Life or Depression concerns found by scoring the questionnaire.;Long Term goal: The  participant improves quality of Life and PHQ9 Scores as seen by post scores and/or verbalization of changes             Quality of Life Scores:   Quality of Life - 12/18/23 1509       Quality of Life Scores   Health/Function Pre 25 %    Socioeconomic Pre 27.92 %    Psych/Spiritual Pre 28 %    Family Pre 30 %    GLOBAL Pre 27.09 %            Scores of 19 and below usually indicate a poorer quality of life in these areas.  A difference of  2-3 points is a clinically meaningful difference.  A difference of 2-3 points in the total score of the Quality of Life Index has been associated with significant improvement in overall quality of life, self-image, physical symptoms, and general health in studies assessing change in quality of life.  PHQ-9: Review Flowsheet       12/18/2023 07/15/2023  Depression screen PHQ 2/9  Decreased Interest 0 0  Down, Depressed, Hopeless 0 0  PHQ - 2 Score 0 0  Altered sleeping 0 0  Tired, decreased energy 1 2  Change in appetite 0 1  Feeling bad or failure about yourself  0 0  Trouble concentrating 2 3  Moving slowly or fidgety/restless 3 0  Suicidal thoughts 0 0  PHQ-9 Score 6 6  Difficult doing work/chores Extremely dIfficult Somewhat difficult  Interpretation of Total Score  Total Score Depression Severity:  1-4 = Minimal depression, 5-9 = Mild depression, 10-14 = Moderate depression, 15-19 = Moderately severe depression, 20-27 = Severe depression   Psychosocial Evaluation and Intervention:  Psychosocial Evaluation - 12/17/23 1314       Psychosocial Evaluation & Interventions   Comments 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  He had a stroke in the past that caused difficulty in word processing, so his wife mainly spoke on the phone during the orientation phone call. 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 He has been walking more and has more energy. When  asked about stress, it was mentioned that he really wants to get back to what he wants to do. His wife helps him when needed and notes that he really enjoyed cardiac rehab in the past so they both think this will help him get closer to where he wants to be, He has a good support system. He reports no sleep concerns or concerns with anxiety or depression.    Expected Outcomes Short: attend cardiac rehab for education and exercise. Long: devleop and maintain positive self care habits    Continue Psychosocial Services  Follow up required by staff             Psychosocial Re-Evaluation:  Psychosocial Re-Evaluation     Row Name 09/19/23 1427             Psychosocial Re-Evaluation   Current issues with None Identified       Comments Greg reports no current stressors at this time. He does state that he stays busy with appointments but is doing well overall. He states that he has "no trouble sleeping." His wife and family are a good support system for him.       Expected Outcomes Short: Continue to attend cardiac rehab for mental boost. Long: Continue to maintain positive outlook.       Interventions Encouraged to attend Cardiac Rehabilitation for the exercise       Continue Psychosocial Services  Follow up required by staff                Psychosocial Discharge (Final Psychosocial Re-Evaluation):  Psychosocial Re-Evaluation - 09/19/23 1427       Psychosocial Re-Evaluation   Current issues with None Identified    Comments Onaje reports no current stressors at this time. He does state that he stays busy with appointments but is doing well overall. He states that he has "no trouble sleeping." His wife and family are a good support system for him.    Expected Outcomes Short: Continue to attend cardiac rehab for mental boost. Long: Continue to maintain positive outlook.    Interventions Encouraged to attend Cardiac Rehabilitation for the exercise    Continue Psychosocial Services   Follow up required by staff             Vocational Rehabilitation: Provide vocational rehab assistance to qualifying candidates.   Vocational Rehab Evaluation & Intervention:  Vocational Rehab - 07/04/23 1342       Initial Vocational Rehab Evaluation & Intervention   Assessment shows need for Vocational Rehabilitation No             Education: Education Goals: Education classes will be provided on a variety of topics geared toward better understanding of heart health and risk factor modification. Participant will state understanding/return demonstration of  topics presented as noted by education test scores.  Learning Barriers/Preferences:  Learning Barriers/Preferences - 07/04/23 1341       Learning Barriers/Preferences   Learning Barriers None    Learning Preferences Individual Instruction             General Cardiac Education Topics:  AED/CPR: - Group verbal and written instruction with the use of models to demonstrate the basic use of the AED with the basic ABC's of resuscitation.   Anatomy and Cardiac Procedures: - Group verbal and visual presentation and models provide information about basic cardiac anatomy and function. Reviews the testing methods done to diagnose heart disease and the outcomes of the test results. Describes the treatment choices: Medical Management, Angioplasty, or Coronary Bypass Surgery for treating various heart conditions including Myocardial Infarction, Angina, Valve Disease, and Cardiac Arrhythmias.  Written material given at graduation. Flowsheet Row Cardiac Rehab from 12/18/2023 in [facility] Cardiac and Pulmonary Rehab  Education need identified 12/18/23       Medication Safety: - Group verbal and visual instruction to review commonly prescribed medications for heart and lung disease. Reviews the medication, class of the drug, and side effects. Includes the steps to properly store meds and maintain the prescription regimen.  Written  material given at graduation.   Intimacy: - Group verbal instruction through game format to discuss how heart and lung disease can affect sexual intimacy. Written material given at graduation.. Flowsheet Row Cardiac Rehab from 12/18/2023 in [facility] Cardiac and Pulmonary Rehab  Education need identified 12/18/23       Know Your Numbers and Heart Failure: - Group verbal and visual instruction to discuss disease risk factors for cardiac and pulmonary disease and treatment options.  Reviews associated critical values for Overweight/Obesity, Hypertension, Cholesterol, and Diabetes.  Discusses basics of heart failure: signs/symptoms and treatments.  Introduces Heart Failure Zone chart for action plan for heart failure.  Written material given at graduation.   Infection Prevention: - Provides verbal and written material to individual with discussion of infection control including proper hand washing and proper equipment cleaning during exercise session. Flowsheet Row Cardiac Rehab from 12/18/2023 in [facility] Cardiac and Pulmonary Rehab  Date 12/18/23  Educator MB  Instruction Review Code 1- Verbalizes Understanding       Falls Prevention: - Provides verbal and written material to individual with discussion of falls prevention and safety. Flowsheet Row Cardiac Rehab from 12/18/2023 in [facility] Cardiac and Pulmonary Rehab  Date 12/18/23  Educator MB  Instruction Review Code 1- Verbalizes Understanding       Other: -Provides group and verbal instruction on various topics (see comments)   Knowledge Questionnaire Score:  Knowledge Questionnaire Score - 12/18/23 1511       Knowledge Questionnaire Score   Pre Score 20/26             Core Components/Risk Factors/Patient Goals at Admission:  Personal Goals and Risk Factors at Admission - 12/18/23 1511       Core Components/Risk Factors/Patient Goals on Admission    Weight Management Yes;Weight Maintenance    Intervention Weight  Management: Develop a combined nutrition and exercise program designed to reach desired caloric intake, while maintaining appropriate intake of nutrient and fiber, sodium and fats, and appropriate energy expenditure required for the weight goal.;Weight Management: Provide education and appropriate resources to help participant work on and attain dietary goals.;Weight Management/Obesity: Establish reasonable short term and long term weight goals.    Admit Weight 174 lb 1.6 oz (79 kg)  Goal Weight: Short Term 174 lb (78.9 kg)    Goal Weight: Long Term 174 lb (78.9 kg)    Expected Outcomes Short Term: Continue to assess and modify interventions until short term weight is achieved;Weight Maintenance: Understanding of the daily nutrition guidelines, which includes 25-35% calories from fat, 7% or less cal from saturated fats, less than 200mg  cholesterol, less than 1.5gm of sodium, & 5 or more servings of fruits and vegetables daily;Understanding recommendations for meals to include 15-35% energy as protein, 25-35% energy from fat, 35-60% energy from carbohydrates, less than 200mg  of dietary cholesterol, 20-35 gm of total fiber daily;Understanding of distribution of calorie intake throughout the day with the consumption of 4-5 meals/snacks;Long Term: Adherence to nutrition and physical activity/exercise program aimed toward attainment of established weight goal    Heart Failure Yes    Intervention Provide a combined exercise and nutrition program that is supplemented with education, support and counseling about heart failure. Directed toward relieving symptoms such as shortness of breath, decreased exercise tolerance, and extremity edema.    Expected Outcomes Improve functional capacity of life;Short term: Attendance in program 2-3 days a week with increased exercise capacity. Reported lower sodium intake. Reported increased fruit and vegetable intake. Reports medication compliance.;Long term: Adoption of  self-care skills and reduction of barriers for early signs and symptoms recognition and intervention leading to self-care maintenance.;Short term: Daily weights obtained and reported for increase. Utilizing diuretic protocols set by physician.    Hypertension Yes    Intervention Provide education on lifestyle modifcations including regular physical activity/exercise, weight management, moderate sodium restriction and increased consumption of fresh fruit, vegetables, and low fat dairy, alcohol moderation, and smoking cessation.;Monitor prescription use compliance.    Expected Outcomes Short Term: Continued assessment and intervention until BP is < 140/76mm HG in hypertensive participants. < 130/37mm HG in hypertensive participants with diabetes, heart failure or chronic kidney disease.;Long Term: Maintenance of blood pressure at goal levels.    Lipids Yes    Intervention Provide education and support for participant on nutrition & aerobic/resistive exercise along with prescribed medications to achieve LDL 70mg , HDL >40mg .    Expected Outcomes Short Term: Participant states understanding of desired cholesterol values and is compliant with medications prescribed. Participant is following exercise prescription and nutrition guidelines.;Long Term: Cholesterol controlled with medications as prescribed, with individualized exercise RX and with personalized nutrition plan. Value goals: LDL < 70mg , HDL > 40 mg.             Education:Diabetes - Individual verbal and written instruction to review signs/symptoms of diabetes, desired ranges of glucose level fasting, after meals and with exercise. Acknowledge that pre and post exercise glucose checks will be done for 3 sessions at entry of program.   Core Components/Risk Factors/Patient Goals Review:   Goals and Risk Factor Review     Row Name 09/19/23 1452             Core Components/Risk Factors/Patient Goals Review   Personal Goals Review Weight  Management/Obesity;Hypertension       Review Waris states that he checks his BP at home and it has stayed within normal ranges. He also states that he is comfortable with where his weight is at this time and according to his doctor he does not need to lose or gain any weight. He also reports taking all his medications as prescribed.       Expected Outcomes Short: Continue to check BP at home. Long: Continue to monitor lifestyle risk factors.  Core Components/Risk Factors/Patient Goals at Discharge (Final Review):   Goals and Risk Factor Review - 09/19/23 1452       Core Components/Risk Factors/Patient Goals Review   Personal Goals Review Weight Management/Obesity;Hypertension    Review Ambers states that he checks his BP at home and it has stayed within normal ranges. He also states that he is comfortable with where his weight is at this time and according to his doctor he does not need to lose or gain any weight. He also reports taking all his medications as prescribed.    Expected Outcomes Short: Continue to check BP at home. Long: Continue to monitor lifestyle risk factors.             ITP Comments:  ITP Comments     Row Name 07/04/23 1344 07/15/23 1549 07/17/23 1429 10/17/23 1539 12/17/23 1320   ITP Comments Initial phone call completed. Diagnosis can be found in [facility] 7/13. EP Orientation scheduled for Monday 8/12 at 10:30. Completed and gym orientation. Initial ITP created and sent for review to Dr. Firman Hughes, Medical Director. First full day of exercise!  Patient was oriented to gym and equipment including functions, settings, policies, and procedures.  Patient's individual exercise prescription and treatment plan were reviewed.  All starting workloads were established based on the results of the 6 minute walk test done at initial orientation visit.  The plan for exercise progression was also introduced and progression will be customized based on patient's  performance and goals. Chia graduated today from  rehab with 30 sessions completed.  Details of the patient's exercise prescription and what He needs to do in order to continue the prescription and progress were discussed with patient.  Patient was given a copy of prescription and goals.  Patient verbalized understanding. Jontrell plans to continue to exercise by returning to Cardiac Rehab after his valve surgery. Initial phone call completed. Diagnosis can be found in [facility] 12/12/23. EP Orientation scheduled for Wednesday 1/15 at 1pm.    Row Name 12/18/23 1501           ITP Comments Completed and gym orientation. Initial ITP created and sent for review to Dr. Firman Hughes, Medical Director.                Comments: Initial ITP

## 2023-12-18 NOTE — Patient Instructions (Signed)
 Patient Instructions  Patient Details  Name: [name]. MRN: [id number] Date of Birth: December 24, 1932 Referring Provider:  Yehuda Helms, MD  Below are your personal goals for exercise, nutrition, and risk factors. Our goal is to help you stay on track towards obtaining and maintaining these goals. We will be discussing your progress on these goals with you throughout the program.  Initial Exercise Prescription:  Initial Exercise Prescription - 12/18/23 1500       Date of Initial Exercise RX and Referring Provider   Date 12/18/23    Referring Provider Shary Deems, MD      Oxygen   Maintain Oxygen Saturation 88% or higher      Recumbant Bike   Level 1    RPM 50    Watts 15    Minutes 15    METs 1.16      NuStep   Level 1    SPM 80    Minutes 15    METs 1.16      Biostep-RELP   Level 1    Minutes 15    METs 1.16      Track   Laps 21    Minutes 15    METs 1.16      Prescription Details   Frequency (times per week) 2    Duration Progress to 30 minutes of continuous aerobic without signs/symptoms of physical distress      Intensity   THRR 40-80% of Max Heartrate 90-118    Ratings of Perceived Exertion 11-13    Perceived Dyspnea 0-4      Progression   Progression Continue to progress workloads to maintain intensity without signs/symptoms of physical distress.      Resistance Training   Training Prescription Yes    Weight 2lb    Reps 10-15             Exercise Goals: Frequency: Be able to perform aerobic exercise two to three times per week in program working toward 2-5 days per week of home exercise.  Intensity: Work with a perceived exertion of 11 (fairly light) - 15 (hard) while following your exercise prescription.  We will make changes to your prescription with you as you progress through the program.   Duration: Be able to do 30 to 45 minutes of continuous aerobic exercise in addition to a 5 minute warm-up and a 5 minute  cool-down routine.   Nutrition Goals: Your personal nutrition goals will be established when you do your nutrition analysis with the dietician.  The following are general nutrition guidelines to follow: Cholesterol < 200mg /day Sodium < 1500mg /day Fiber: Men over 50 yrs - 30 grams per day  Personal Goals:  Personal Goals and Risk Factors at Admission - 12/18/23 1511       Core Components/Risk Factors/Patient Goals on Admission    Weight Management Yes;Weight Maintenance    Intervention Weight Management: Develop a combined nutrition and exercise program designed to reach desired caloric intake, while maintaining appropriate intake of nutrient and fiber, sodium and fats, and appropriate energy expenditure required for the weight goal.;Weight Management: Provide education and appropriate resources to help participant work on and attain dietary goals.;Weight Management/Obesity: Establish reasonable short term and long term weight goals.    Admit Weight 174 lb 1.6 oz (79 kg)    Goal Weight: Short Term 174 lb (78.9 kg)    Goal Weight: Long Term 174 lb (78.9 kg)    Expected Outcomes Short Term: Continue to assess  and modify interventions until short term weight is achieved;Weight Maintenance: Understanding of the daily nutrition guidelines, which includes 25-35% calories from fat, 7% or less cal from saturated fats, less than 200mg  cholesterol, less than 1.5gm of sodium, & 5 or more servings of fruits and vegetables daily;Understanding recommendations for meals to include 15-35% energy as protein, 25-35% energy from fat, 35-60% energy from carbohydrates, less than 200mg  of dietary cholesterol, 20-35 gm of total fiber daily;Understanding of distribution of calorie intake throughout the day with the consumption of 4-5 meals/snacks;Long Term: Adherence to nutrition and physical activity/exercise program aimed toward attainment of established weight goal    Heart Failure Yes    Intervention Provide a  combined exercise and nutrition program that is supplemented with education, support and counseling about heart failure. Directed toward relieving symptoms such as shortness of breath, decreased exercise tolerance, and extremity edema.    Expected Outcomes Improve functional capacity of life;Short term: Attendance in program 2-3 days a week with increased exercise capacity. Reported lower sodium intake. Reported increased fruit and vegetable intake. Reports medication compliance.;Long term: Adoption of self-care skills and reduction of barriers for early signs and symptoms recognition and intervention leading to self-care maintenance.;Short term: Daily weights obtained and reported for increase. Utilizing diuretic protocols set by physician.    Hypertension Yes    Intervention Provide education on lifestyle modifcations including regular physical activity/exercise, weight management, moderate sodium restriction and increased consumption of fresh fruit, vegetables, and low fat dairy, alcohol moderation, and smoking cessation.;Monitor prescription use compliance.    Expected Outcomes Short Term: Continued assessment and intervention until BP is < 140/65mm HG in hypertensive participants. < 130/49mm HG in hypertensive participants with diabetes, heart failure or chronic kidney disease.;Long Term: Maintenance of blood pressure at goal levels.    Lipids Yes    Intervention Provide education and support for participant on nutrition & aerobic/resistive exercise along with prescribed medications to achieve LDL 70mg , HDL >40mg .    Expected Outcomes Short Term: Participant states understanding of desired cholesterol values and is compliant with medications prescribed. Participant is following exercise prescription and nutrition guidelines.;Long Term: Cholesterol controlled with medications as prescribed, with individualized exercise RX and with personalized nutrition plan. Value goals: LDL < 70mg , HDL > 40 mg.              Tobacco Use Initial Evaluation: Social History   Tobacco Use  Smoking Status Never  Smokeless Tobacco Never    Exercise Goals and Review:  Exercise Goals     Row Name 07/15/23 1515 12/18/23 1507           Exercise Goals   Increase Physical Activity Yes Yes      Intervention -- Provide advice, education, support and counseling about physical activity/exercise needs.;Develop an individualized exercise prescription for aerobic and resistive training based on initial evaluation findings, risk stratification, comorbidities and participant's personal goals.      Expected Outcomes Short Term: Attend rehab on a regular basis to increase amount of physical activity.;Long Term: Add in home exercise to make exercise part of routine and to increase amount of physical activity.;Long Term: Exercising regularly at least 3-5 days a week. Short Term: Attend rehab on a regular basis to increase amount of physical activity.;Long Term: Add in home exercise to make exercise part of routine and to increase amount of physical activity.;Long Term: Exercising regularly at least 3-5 days a week.      Increase Strength and Stamina Yes Yes  Intervention -- Provide advice, education, support and counseling about physical activity/exercise needs.;Develop an individualized exercise prescription for aerobic and resistive training based on initial evaluation findings, risk stratification, comorbidities and participant's personal goals.      Expected Outcomes Short Term: Increase workloads from initial exercise prescription for resistance, speed, and METs.;Short Term: Perform resistance training exercises routinely during rehab and add in resistance training at home;Long Term: Improve cardiorespiratory fitness, muscular endurance and strength as measured by increased METs and functional capacity ( ) Short Term: Increase workloads from initial exercise prescription for resistance, speed, and METs.;Short  Term: Perform resistance training exercises routinely during rehab and add in resistance training at home;Long Term: Improve cardiorespiratory fitness, muscular endurance and strength as measured by increased METs and functional capacity ( )      Able to understand and use rate of perceived exertion (RPE) scale Yes Yes      Intervention Provide education and explanation on how to use RPE scale Provide education and explanation on how to use RPE scale      Expected Outcomes Short Term: Able to use RPE daily in rehab to express subjective intensity level;Long Term:  Able to use RPE to guide intensity level when exercising independently Short Term: Able to use RPE daily in rehab to express subjective intensity level;Long Term:  Able to use RPE to guide intensity level when exercising independently      Able to understand and use Dyspnea scale Yes Yes      Intervention Provide education and explanation on how to use Dyspnea scale Provide education and explanation on how to use Dyspnea scale      Expected Outcomes Short Term: Able to use Dyspnea scale daily in rehab to express subjective sense of shortness of breath during exertion;Long Term: Able to use Dyspnea scale to guide intensity level when exercising independently Short Term: Able to use Dyspnea scale daily in rehab to express subjective sense of shortness of breath during exertion;Long Term: Able to use Dyspnea scale to guide intensity level when exercising independently      Knowledge and understanding of Target Heart Rate Range (THRR) Yes Yes      Intervention Provide education and explanation of THRR including how the numbers were predicted and where they are located for reference Provide education and explanation of THRR including how the numbers were predicted and where they are located for reference      Expected Outcomes Short Term: Able to state/look up THRR;Short Term: Able to use daily as guideline for intensity in rehab;Long Term: Able to  use THRR to govern intensity when exercising independently Short Term: Able to state/look up THRR;Short Term: Able to use daily as guideline for intensity in rehab;Long Term: Able to use THRR to govern intensity when exercising independently      Able to check pulse independently Yes Yes      Intervention Provide education and demonstration on how to check pulse in carotid and radial arteries.;Review the importance of being able to check your own pulse for safety during independent exercise Provide education and demonstration on how to check pulse in carotid and radial arteries.;Review the importance of being able to check your own pulse for safety during independent exercise      Expected Outcomes Short Term: Able to explain why pulse checking is important during independent exercise;Long Term: Able to check pulse independently and accurately Short Term: Able to explain why pulse checking is important during independent exercise;Long Term: Able to check pulse independently and accurately  Understanding of Exercise Prescription Yes Yes      Intervention Provide education, explanation, and written materials on patient's individual exercise prescription Provide education, explanation, and written materials on patient's individual exercise prescription      Expected Outcomes Short Term: Able to explain program exercise prescription;Long Term: Able to explain home exercise prescription to exercise independently Short Term: Able to explain program exercise prescription;Long Term: Able to explain home exercise prescription to exercise independently

## 2023-12-23 ENCOUNTER — Encounter: Payer: Medicare Other | Admitting: *Deleted

## 2023-12-23 DIAGNOSIS — Z952 Presence of prosthetic heart valve: Secondary | ICD-10-CM

## 2023-12-23 DIAGNOSIS — Z48812 Encounter for surgical aftercare following surgery on the circulatory system: Secondary | ICD-10-CM | POA: Diagnosis not present

## 2023-12-23 NOTE — Progress Notes (Signed)
Daily Session Note  Patient Details  Name: [name]. MRN: [id number] Date of Birth: [birth year] Referring Provider:   Flowsheet Row Cardiac Rehab from 12/18/2023 in [facility] Cardiac and Pulmonary Rehab  Referring Provider Aram Beecham, MD       Encounter Date: 12/23/2023  Check In:  Session Check In - 12/23/23 1127       Check-In   Supervising physician immediately available to respond to emergencies See telemetry face sheet for immediately available ER MD    Location [facility]-Cardiac & Pulmonary Rehab    Staff Present Rory Percy, MS, Exercise Physiologist;Kallum Jorgensen Jewel Baize RN,BSN;Maxon Manya Silvas BS, Exercise Physiologist;Kelly Cloretta Ned, ACSM CEP, Exercise Physiologist    Virtual Visit No    Medication changes reported     No    Fall or balance concerns reported    No    Warm-up and Cool-down Performed on first and last piece of equipment    Resistance Training Performed Yes    VAD Patient? No    PAD/SET Patient? No      Pain Assessment   Currently in Pain? No/denies                Social History   Tobacco Use  Smoking Status Never  Smokeless Tobacco Never    Goals Met:  Independence with exercise equipment Exercise tolerated well No report of concerns or symptoms today Strength training completed today  Goals Unmet:  Not Applicable  Comments: First full day of exercise!  Patient was oriented to gym and equipment including functions, settings, policies, and procedures.  Patient's individual exercise prescription and treatment plan were reviewed.  All starting workloads were established based on the results of the 6 minute walk test done at initial orientation visit.  The plan for exercise progression was also introduced and progression will be customized based on patient's performance and goals.     Dr. Bethann Punches is Medical Director for [facility] Cardiac Rehabilitation.  Dr. Vida Rigger is Medical Director for [facility] Pulmonary  Rehabilitation.

## 2023-12-25 DIAGNOSIS — Z952 Presence of prosthetic heart valve: Secondary | ICD-10-CM

## 2023-12-25 DIAGNOSIS — Z48812 Encounter for surgical aftercare following surgery on the circulatory system: Secondary | ICD-10-CM | POA: Diagnosis not present

## 2023-12-25 NOTE — Progress Notes (Signed)
Daily Session Note  Patient Details  Name: [name]. MRN: [id number] Date of Birth: 17-Dec-1932 Referring Provider:   Flowsheet Row Cardiac Rehab from 12/18/2023 in [facility] Cardiac and Pulmonary Rehab  Referring Provider Aram Beecham, MD       Encounter Date: 12/25/2023  Check In:  Session Check In - 12/25/23 1113       Check-In   Supervising physician immediately available to respond to emergencies See telemetry face sheet for immediately available ER MD    Location [facility]-Cardiac & Pulmonary Rehab    Staff Present Cora Collum, RN, BSN, CCRP;Noah Tickle, BS, Exercise Physiologist;Maxon Conetta BS, Exercise Physiologist;Meredith Jewel Baize RN,BSN    Virtual Visit No    Medication changes reported     No    Fall or balance concerns reported    No    Warm-up and Cool-down Performed on first and last piece of equipment    Resistance Training Performed Yes    VAD Patient? No    PAD/SET Patient? No      Pain Assessment   Currently in Pain? No/denies                Social History   Tobacco Use  Smoking Status Never  Smokeless Tobacco Never    Goals Met:  Independence with exercise equipment Exercise tolerated well No report of concerns or symptoms today  Goals Unmet:  Not Applicable  Comments: Pt able to follow exercise prescription today without complaint.  Will continue to monitor for progression.    Dr. Bethann Punches is Medical Director for [facility] Cardiac Rehabilitation.  Dr. Vida Rigger is Medical Director for [facility] Pulmonary Rehabilitation.

## 2023-12-30 ENCOUNTER — Encounter: Payer: Medicare Other | Admitting: *Deleted

## 2023-12-30 ENCOUNTER — Ambulatory Visit: Payer: Medicare Other | Admitting: Cardiovascular Disease

## 2023-12-30 DIAGNOSIS — Z48812 Encounter for surgical aftercare following surgery on the circulatory system: Secondary | ICD-10-CM | POA: Diagnosis not present

## 2023-12-30 DIAGNOSIS — Z952 Presence of prosthetic heart valve: Secondary | ICD-10-CM

## 2023-12-30 NOTE — Progress Notes (Signed)
Daily Session Note  Patient Details  Name: [name]. MRN: [id number] Date of Birth: 1933/06/17 Referring Provider:   Flowsheet Row Cardiac Rehab from 12/18/2023 in [facility] Cardiac and Pulmonary Rehab  Referring Provider Aram Beecham, MD       Encounter Date: 12/30/2023  Check In:  Session Check In - 12/30/23 1129       Check-In   Supervising physician immediately available to respond to emergencies See telemetry face sheet for immediately available ER MD    Location [facility]-Cardiac & Pulmonary Rehab    Staff Present Rory Percy, MS, Exercise Physiologist;Maxon Suzzette Righter, Exercise Physiologist;Kelly Cloretta Ned, ACSM CEP, Exercise Physiologist;Quetzal Meany, RN, BSN, CCRP    Virtual Visit No    Medication changes reported     No    Fall or balance concerns reported    No    Warm-up and Cool-down Performed on first and last piece of equipment    Resistance Training Performed Yes    VAD Patient? No    PAD/SET Patient? No      Pain Assessment   Currently in Pain? No/denies                Social History   Tobacco Use  Smoking Status Never  Smokeless Tobacco Never    Goals Met:  Independence with exercise equipment Exercise tolerated well No report of concerns or symptoms today  Goals Unmet:  Not Applicable  Comments: Pt able to follow exercise prescription today without complaint.  Will continue to monitor for progression.    Dr. Bethann Punches is Medical Director for [facility] Cardiac Rehabilitation.  Dr. Vida Rigger is Medical Director for [facility] Pulmonary Rehabilitation.

## 2024-01-06 ENCOUNTER — Encounter: Payer: Medicare Other | Attending: Cardiology

## 2024-01-06 DIAGNOSIS — Z48812 Encounter for surgical aftercare following surgery on the circulatory system: Secondary | ICD-10-CM | POA: Insufficient documentation

## 2024-01-06 DIAGNOSIS — Z952 Presence of prosthetic heart valve: Secondary | ICD-10-CM | POA: Diagnosis present

## 2024-01-06 DIAGNOSIS — I252 Old myocardial infarction: Secondary | ICD-10-CM | POA: Diagnosis not present

## 2024-01-06 DIAGNOSIS — I214 Non-ST elevation (NSTEMI) myocardial infarction: Secondary | ICD-10-CM | POA: Diagnosis present

## 2024-01-06 NOTE — Progress Notes (Signed)
Daily Session Note  Patient Details  Name: [name]. MRN: [id number] Date of Birth: 1933-07-03 Referring Provider:   Flowsheet Row Cardiac Rehab from 12/18/2023 in [facility] Cardiac and Pulmonary Rehab  Referring Provider Aram Beecham, MD       Encounter Date: 01/06/2024  Check In:  Session Check In - 01/06/24 1114       Check-In   Supervising physician immediately available to respond to emergencies See telemetry face sheet for immediately available ER MD    Location [facility]-Cardiac & Pulmonary Rehab    Staff Present Kelton Pillar RN,BSN,MPA;Margaret Best, MS, Exercise Physiologist;Maxon Conetta BS, Exercise Physiologist;Somnang Mahan Cloretta Ned, ACSM CEP, Exercise Physiologist    Virtual Visit No    Medication changes reported     No    Fall or balance concerns reported    No    Tobacco Cessation No Change    Warm-up and Cool-down Performed on first and last piece of equipment    Resistance Training Performed Yes    VAD Patient? No    PAD/SET Patient? No      Pain Assessment   Currently in Pain? No/denies                Social History   Tobacco Use  Smoking Status Never  Smokeless Tobacco Never    Goals Met:  Independence with exercise equipment Exercise tolerated well No report of concerns or symptoms today Strength training completed today  Goals Unmet:  Not Applicable  Comments: Pt able to follow exercise prescription today without complaint.  Will continue to monitor for progression.    Dr. Bethann Punches is Medical Director for [facility] Cardiac Rehabilitation.  Dr. Vida Rigger is Medical Director for [facility] Pulmonary Rehabilitation.

## 2024-01-08 ENCOUNTER — Encounter: Payer: Medicare Other | Admitting: *Deleted

## 2024-01-08 DIAGNOSIS — I214 Non-ST elevation (NSTEMI) myocardial infarction: Secondary | ICD-10-CM

## 2024-01-08 DIAGNOSIS — Z952 Presence of prosthetic heart valve: Secondary | ICD-10-CM

## 2024-01-08 NOTE — Progress Notes (Signed)
 Daily Session Note  Patient Details  Name: [name]. MRN: [id number] Date of Birth: 08-11-1933 Referring Provider:   Flowsheet Row Cardiac Rehab from 12/18/2023 in [facility] Cardiac and Pulmonary Rehab  Referring Provider Auston Purchase, MD       Encounter Date: 01/08/2024  Check In:  Session Check In - 01/08/24 1117       Check-In   Supervising physician immediately available to respond to emergencies See telemetry face sheet for immediately available ER MD    Location [facility]-Cardiac & Pulmonary Rehab    Staff Present Othel Durand, RN, BSN, CCRP;Margaret Best, MS, Exercise Physiologist;Joseph Rolinda RCP,RRT,BSRT;Meredith Tressa RN,BSN    Virtual Visit No    Medication changes reported     No    Fall or balance concerns reported    No    Warm-up and Cool-down Performed on first and last piece of equipment    Resistance Training Performed Yes    VAD Patient? No    PAD/SET Patient? No      Pain Assessment   Currently in Pain? No/denies                Social History   Tobacco Use  Smoking Status Never  Smokeless Tobacco Never    Goals Met:  Independence with exercise equipment Exercise tolerated well No report of concerns or symptoms today  Goals Unmet:  Not Applicable  Comments: Pt able to follow exercise prescription today without complaint.  Will continue to monitor for progression.    Dr. Oneil Pinal is Medical Director for [facility] Cardiac Rehabilitation.  Dr. Fuad Aleskerov is Medical Director for [facility] Pulmonary Rehabilitation.

## 2024-01-10 ENCOUNTER — Encounter: Payer: Self-pay | Admitting: Podiatry

## 2024-01-10 ENCOUNTER — Ambulatory Visit (INDEPENDENT_AMBULATORY_CARE_PROVIDER_SITE_OTHER): Payer: Medicare Other | Admitting: Podiatry

## 2024-01-10 VITALS — Ht 68.4 in | Wt 174.1 lb

## 2024-01-10 DIAGNOSIS — M79676 Pain in unspecified toe(s): Secondary | ICD-10-CM | POA: Diagnosis not present

## 2024-01-10 DIAGNOSIS — L84 Corns and callosities: Secondary | ICD-10-CM | POA: Diagnosis not present

## 2024-01-10 DIAGNOSIS — B351 Tinea unguium: Secondary | ICD-10-CM | POA: Diagnosis not present

## 2024-01-10 DIAGNOSIS — G629 Polyneuropathy, unspecified: Secondary | ICD-10-CM | POA: Diagnosis not present

## 2024-01-13 ENCOUNTER — Encounter: Payer: Medicare Other | Admitting: *Deleted

## 2024-01-13 DIAGNOSIS — I214 Non-ST elevation (NSTEMI) myocardial infarction: Secondary | ICD-10-CM

## 2024-01-13 DIAGNOSIS — Z952 Presence of prosthetic heart valve: Secondary | ICD-10-CM | POA: Diagnosis not present

## 2024-01-13 NOTE — Progress Notes (Signed)
 Daily Session Note  Patient Details  Name: [name]. MRN: [id number] Date of Birth: [birth year] Referring Provider:   Flowsheet Row Cardiac Rehab from 12/18/2023 in [facility] Cardiac and Pulmonary Rehab  Referring Provider Shary Deems, MD       Encounter Date: 01/13/2024  Check In:  Session Check In - 01/13/24 1126       Check-In   Supervising physician immediately available to respond to emergencies See telemetry face sheet for immediately available ER MD    Location [facility]-Cardiac & Pulmonary Rehab    Staff Present Maud Sorenson, RN, BSN, CCRP;Meredith Manson Seitz RN,BSN;Kelly Lagrange BS, ACSM CEP, Exercise Physiologist;Margaret Best, MS, Exercise Physiologist;Maxon Conetta BS, Exercise Physiologist    Virtual Visit No    Medication changes reported     No    Fall or balance concerns reported    No    Warm-up and Cool-down Performed on first and last piece of equipment    Resistance Training Performed Yes    VAD Patient? No    PAD/SET Patient? No      Pain Assessment   Currently in Pain? No/denies                Social History   Tobacco Use  Smoking Status Never  Smokeless Tobacco Never    Goals Met:  Independence with exercise equipment Exercise tolerated well No report of concerns or symptoms today  Goals Unmet:  Not Applicable  Comments: Pt able to follow exercise prescription today without complaint.  Will continue to monitor for progression.    Dr. Firman Hughes is Medical Director for [facility] Cardiac Rehabilitation.  Dr. Fuad Aleskerov is Medical Director for [name] Pulmonary Rehabilitation.

## 2024-01-15 ENCOUNTER — Encounter: Payer: Self-pay | Admitting: *Deleted

## 2024-01-15 DIAGNOSIS — Z952 Presence of prosthetic heart valve: Secondary | ICD-10-CM

## 2024-01-15 NOTE — Progress Notes (Signed)
Cardiac Individual Treatment Plan  Patient Details  Name: [name]. MRN: [id number] Date of Birth: 09-07-1933 Referring Provider:   Flowsheet Row Cardiac Rehab from 12/18/2023 in [facility] Cardiac and Pulmonary Rehab  Referring Provider Aram Beecham, MD       Initial Encounter Date:  Flowsheet Row Cardiac Rehab from 12/18/2023 in [facility] Cardiac and Pulmonary Rehab  Date 12/18/23       Visit Diagnosis: S/P TAVR (transcatheter aortic valve replacement)  Patient's Home Medications on Admission:  Current Outpatient Medications:    acetaminophen (TYLENOL) 650 MG CR tablet, Take 650 mg by mouth every 8 (eight) hours as needed. , Disp: , Rfl:    apixaban (ELIQUIS) 2.5 MG TABS tablet, Take 1 tablet (2.5 mg total) by mouth 2 (two) times daily., Disp: 180 tablet, Rfl: 3   Ascorbic Acid (VITAMIN C) 1000 MG tablet, Take 1,000 mg by mouth daily., Disp: , Rfl:    atorvastatin (LIPITOR) 20 MG tablet, Take 20 mg by mouth at bedtime., Disp: , Rfl:    b complex vitamins tablet, Take 1 tablet by mouth daily., Disp: , Rfl:    cholecalciferol (VITAMIN D) 1000 units tablet, Take 2,000 Units by mouth daily., Disp: , Rfl:    clopidogrel (PLAVIX) 75 MG tablet, Take 1 tablet (75 mg total) by mouth daily., Disp: 90 tablet, Rfl: 0   cyanocobalamin (VITAMIN B12) 1000 MCG tablet, Take 1,000 mcg by mouth daily., Disp: , Rfl:    docusate sodium (COLACE) 100 MG capsule, Take 100 mg by mouth 2 (two) times daily., Disp: , Rfl:    empagliflozin (JARDIANCE) 25 MG TABS tablet, Take 12.5 mg by mouth., Disp: , Rfl:    ezetimibe (ZETIA) 10 MG tablet, Take 1 tablet (10 mg total) by mouth daily., Disp: 90 tablet, Rfl: 3   FLAXSEED, LINSEED, PO, Take 2 g by mouth daily., Disp: , Rfl:    furosemide (LASIX) 20 MG tablet, Take 20 mg by mouth as needed., Disp: , Rfl:    metoprolol succinate (TOPROL-XL) 25 MG 24 hr tablet, Take 12.5 mg by mouth., Disp: , Rfl:    nitroGLYCERIN (NITROSTAT) 0.4 MG SL tablet, Place 1  tablet (0.4 mg total) under the tongue every 5 (five) minutes x 3 doses as needed for chest pain., Disp: 25 tablet, Rfl: 0   pantoprazole (PROTONIX) 40 MG tablet, Take 1 tablet (40 mg total) by mouth 2 (two) times daily., Disp: 60 tablet, Rfl: 1   potassium chloride (KLOR-CON) 10 MEQ tablet, Take 10 mEq by mouth as needed., Disp: , Rfl:    pregabalin (LYRICA) 100 MG capsule, Take 100 mg by mouth in the morning., Disp: , Rfl:    pregabalin (LYRICA) 150 MG capsule, Take 150 mg by mouth at bedtime., Disp: , Rfl:    Probiotic Product (UP4 PROBIOTICS PO), Take 7.5 mg by mouth., Disp: , Rfl:    Psyllium (GERI-MUCIL) 25 % POWD, Take by mouth., Disp: , Rfl:    ranolazine (RANEXA) 500 MG 12 hr tablet, Take 1 tablet (500 mg total) by mouth 2 (two) times daily., Disp: 180 tablet, Rfl: 3   tamsulosin (FLOMAX) 0.4 MG CAPS capsule, Take 2 capsules (0.8 mg total) by mouth daily., Disp: 30 capsule, Rfl: 0   zinc sulfate 220 (50 Zn) MG capsule, Take 1 tablet by mouth daily., Disp: , Rfl:   Past Medical History: Past Medical History:  Diagnosis Date   Acute embolic stroke (HCC) 02/01/2017   BPH (benign prostatic hyperplasia)    Bradycardia  11/10/2015   Coronary artery disease    Dizziness 08/21/2012   DYSPEPSIA 07/31/2010   Qualifier: Diagnosis of  By: Janee Morn, RN, Ashley     Hyperlipidemia 05/16/2010   Qualifier: Diagnosis of  By: Mariah Milling MD, [name]      Hypertension    HYPERTENSION, BENIGN 05/16/2010   Qualifier: Diagnosis of  By: Mariah Milling MD, [name]     Stenosis of right carotid artery 02/01/2017    Tobacco Use: Social History   Tobacco Use  Smoking Status Never  Smokeless Tobacco Never    Labs: Review Flowsheet       Latest Ref Rng & Units 01/23/2010 06/15/2023 06/16/2023  Labs for ITP Cardiac and Pulmonary Rehab  Cholestrol 0 - 200 mg/dL 161  - 90   LDL (calc) 0 - 99 mg/dL 09.6  - 44   HDL-C >04 mg/dL 54.0  - 40   Trlycerides <150 mg/dL 93  - 30   Hemoglobin J8J 4.8 - 5.6 % 5.4  6.3  6.3  -     Details       Multiple values from one day are sorted in reverse-chronological order          Exercise Target Goals: Exercise Program Goal: Individual exercise prescription set using results from initial 6 min walk test and THRR while considering  patient's activity barriers and safety.   Exercise Prescription Goal: Initial exercise prescription builds to 30-45 minutes a day of aerobic activity, 2-3 days per week.  Home exercise guidelines will be given to patient during program as part of exercise prescription that the participant will acknowledge.   Education: Aerobic Exercise: - Group verbal and visual presentation on the components of exercise prescription. Introduces F.I.T.T principle from ACSM for exercise prescriptions.  Reviews F.I.T.T. principles of aerobic exercise including progression. Written material given at graduation. Flowsheet Row Cardiac Rehab from 12/18/2023 in [facility] Cardiac and Pulmonary Rehab  Education need identified 12/18/23       Education: Resistance Exercise: - Group verbal and visual presentation on the components of exercise prescription. Introduces F.I.T.T principle from ACSM for exercise prescriptions  Reviews F.I.T.T. principles of resistance exercise including progression. Written material given at graduation.    Education: Exercise & Equipment Safety: - Individual verbal instruction and demonstration of equipment use and safety with use of the equipment. Flowsheet Row Cardiac Rehab from 12/18/2023 in [facility] Cardiac and Pulmonary Rehab  Date 12/18/23  Educator MB  Instruction Review Code 1- Verbalizes Understanding       Education: Exercise Physiology & General Exercise Guidelines: - Group verbal and written instruction with models to review the exercise physiology of the cardiovascular system and associated critical values. Provides general exercise guidelines with specific guidelines to those with heart or lung disease.  Flowsheet Row Cardiac  Rehab from 12/18/2023 in [facility] Cardiac and Pulmonary Rehab  Education need identified 12/18/23       Education: Flexibility, Balance, Mind/Body Relaxation: - Group verbal and visual presentation with interactive activity on the components of exercise prescription. Introduces F.I.T.T principle from ACSM for exercise prescriptions. Reviews F.I.T.T. principles of flexibility and balance exercise training including progression. Also discusses the mind body connection.  Reviews various relaxation techniques to help reduce and manage stress (i.e. Deep breathing, progressive muscle relaxation, and visualization). Balance handout provided to take home. Written material given at graduation.   Activity Barriers & Risk Stratification:  Activity Barriers & Cardiac Risk Stratification - 12/18/23 1503       Activity Barriers & Cardiac Risk Stratification  Activity Barriers Arthritis;Back Problems;Balance Concerns;Muscular Weakness    Cardiac Risk Stratification High             6 Minute Walk:  6 Minute Walk     Row Name 12/18/23 1501         6 Minute Walk   Phase Initial     Distance 805 feet     Walk Time 6 minutes     # of Rest Breaks 0     MPH 1.52     METS 1.16     RPE 11     Perceived Dyspnea  0     VO2 Peak 4.06     Symptoms No     Resting HR 64 bpm     Resting BP 146/86     Resting Oxygen Saturation  97 %     Exercise Oxygen Saturation  during 6 min walk 96 %     Max Ex. HR 90 bpm     Max Ex. BP 130/60     2 Minute Post BP 120/64              Oxygen Initial Assessment:   Oxygen Re-Evaluation:   Oxygen Discharge (Final Oxygen Re-Evaluation):   Initial Exercise Prescription:  Initial Exercise Prescription - 12/18/23 1500       Date of Initial Exercise RX and Referring Provider   Date 12/18/23    Referring Provider Aram Beecham, MD      Oxygen   Maintain Oxygen Saturation 88% or higher      Recumbant Bike   Level 1    RPM 50    Watts 15     Minutes 15    METs 1.16      NuStep   Level 1    SPM 80    Minutes 15    METs 1.16      Biostep-RELP   Level 1    Minutes 15    METs 1.16      Track   Laps 21    Minutes 15    METs 1.16      Prescription Details   Frequency (times per week) 2    Duration Progress to 30 minutes of continuous aerobic without signs/symptoms of physical distress      Intensity   THRR 40-80% of Max Heartrate 90-118    Ratings of Perceived Exertion 11-13    Perceived Dyspnea 0-4      Progression   Progression Continue to progress workloads to maintain intensity without signs/symptoms of physical distress.      Resistance Training   Training Prescription Yes    Weight 2lb    Reps 10-15             Perform Capillary Blood Glucose checks as needed.  Exercise Prescription Changes:   Exercise Prescription Changes     Row Name 12/18/23 1500 01/07/24 1500           Response to Exercise   Blood Pressure (Admit) 146/86 124/70      Blood Pressure (Exercise) 130/60 126/70      Blood Pressure (Exit) 120/64 124/70      Heart Rate (Admit) 64 bpm 66 bpm      Heart Rate (Exercise) 90 bpm 101 bpm      Heart Rate (Exit) 62 bpm 73 bpm      Oxygen Saturation (Admit) 97 % --      Oxygen Saturation (Exercise) 96 % --  Oxygen Saturation (Exit) 96 % --      Rating of Perceived Exertion (Exercise) 11 13      Perceived Dyspnea (Exercise) 0 0      Symptoms none none      Comments results First 2 weeks of exercise      Duration Progress to 30 minutes of  aerobic without signs/symptoms of physical distress Progress to 30 minutes of  aerobic without signs/symptoms of physical distress      Intensity THRR New THRR New        Progression   Progression Continue to progress workloads to maintain intensity without signs/symptoms of physical distress. Continue to progress workloads to maintain intensity without signs/symptoms of physical distress.      Average METs 1.16 1.96        Resistance  Training   Training Prescription -- Yes      Weight -- 2lb      Reps -- 10-15        Interval Training   Interval Training -- No        NuStep   Level -- 2  T6      Minutes -- 15      METs -- 1.9        Biostep-RELP   Level -- 1      Minutes -- 15      METs -- 2        Track   Laps -- 21      Minutes -- 15      METs -- 2.14        Oxygen   Maintain Oxygen Saturation -- 88% or higher               Exercise Comments:   Exercise Comments     Row Name 12/23/23 1128           Exercise Comments First full day of exercise!  Patient was oriented to gym and equipment including functions, settings, policies, and procedures.  Patient's individual exercise prescription and treatment plan were reviewed.  All starting workloads were established based on the results of the 6 minute walk test done at initial orientation visit.  The plan for exercise progression was also introduced and progression will be customized based on patient's performance and goals.                Exercise Goals and Review:   Exercise Goals     Row Name 12/18/23 1507             Exercise Goals   Increase Physical Activity Yes       Intervention Provide advice, education, support and counseling about physical activity/exercise needs.;Develop an individualized exercise prescription for aerobic and resistive training based on initial evaluation findings, risk stratification, comorbidities and participant's personal goals.       Expected Outcomes Short Term: Attend rehab on a regular basis to increase amount of physical activity.;Long Term: Add in home exercise to make exercise part of routine and to increase amount of physical activity.;Long Term: Exercising regularly at least 3-5 days a week.       Increase Strength and Stamina Yes       Intervention Provide advice, education, support and counseling about physical activity/exercise needs.;Develop an individualized exercise prescription for aerobic  and resistive training based on initial evaluation findings, risk stratification, comorbidities and participant's personal goals.       Expected Outcomes Short Term: Increase workloads from initial exercise prescription  for resistance, speed, and METs.;Short Term: Perform resistance training exercises routinely during rehab and add in resistance training at home;Long Term: Improve cardiorespiratory fitness, muscular endurance and strength as measured by increased METs and functional capacity ( )       Able to understand and use rate of perceived exertion (RPE) scale Yes       Intervention Provide education and explanation on how to use RPE scale       Expected Outcomes Short Term: Able to use RPE daily in rehab to express subjective intensity level;Long Term:  Able to use RPE to guide intensity level when exercising independently       Able to understand and use Dyspnea scale Yes       Intervention Provide education and explanation on how to use Dyspnea scale       Expected Outcomes Short Term: Able to use Dyspnea scale daily in rehab to express subjective sense of shortness of breath during exertion;Long Term: Able to use Dyspnea scale to guide intensity level when exercising independently       Knowledge and understanding of Target Heart Rate Range (THRR) Yes       Intervention Provide education and explanation of THRR including how the numbers were predicted and where they are located for reference       Expected Outcomes Short Term: Able to state/look up THRR;Short Term: Able to use daily as guideline for intensity in rehab;Long Term: Able to use THRR to govern intensity when exercising independently       Able to check pulse independently Yes       Intervention Provide education and demonstration on how to check pulse in carotid and radial arteries.;Review the importance of being able to check your own pulse for safety during independent exercise       Expected Outcomes Short Term: Able to  explain why pulse checking is important during independent exercise;Long Term: Able to check pulse independently and accurately       Understanding of Exercise Prescription Yes       Intervention Provide education, explanation, and written materials on patient's individual exercise prescription       Expected Outcomes Short Term: Able to explain program exercise prescription;Long Term: Able to explain home exercise prescription to exercise independently                Exercise Goals Re-Evaluation :  Exercise Goals Re-Evaluation     Row Name 12/23/23 1129 01/07/24 1528           Exercise Goal Re-Evaluation   Exercise Goals Review Increase Physical Activity;Able to understand and use rate of perceived exertion (RPE) scale;Knowledge and understanding of Target Heart Rate Range (THRR);Understanding of Exercise Prescription;Increase Strength and Stamina;Able to check pulse independently Increase Physical Activity;Increase Strength and Stamina;Understanding of Exercise Prescription      Comments Reviewed RPE and dyspnea scale, THR and program prescription with pt today.  Pt voiced understanding and was given a copy of goals to take home. Christoph is off to a good start in the program. He was able to increase his level on the T6 nustep to level 2. He was also able to maintain his track laps, and level on the biostep. We will continue to monitor his progress in the program.      Expected Outcomes Short: Use RPE daily to regulate intensity.  Long: Follow program prescription in THR. Short: Continue to increase level on the T6 nustep, and push for more laps on the track.  Long: Continue exercise to improve strength and stamina.               Discharge Exercise Prescription (Final Exercise Prescription Changes):  Exercise Prescription Changes - 01/07/24 1500       Response to Exercise   Blood Pressure (Admit) 124/70    Blood Pressure (Exercise) 126/70    Blood Pressure (Exit) 124/70    Heart  Rate (Admit) 66 bpm    Heart Rate (Exercise) 101 bpm    Heart Rate (Exit) 73 bpm    Rating of Perceived Exertion (Exercise) 13    Perceived Dyspnea (Exercise) 0    Symptoms none    Comments First 2 weeks of exercise    Duration Progress to 30 minutes of  aerobic without signs/symptoms of physical distress    Intensity THRR New      Progression   Progression Continue to progress workloads to maintain intensity without signs/symptoms of physical distress.    Average METs 1.96      Resistance Training   Training Prescription Yes    Weight 2lb    Reps 10-15      Interval Training   Interval Training No      NuStep   Level 2   T6   Minutes 15    METs 1.9      Biostep-RELP   Level 1    Minutes 15    METs 2      Track   Laps 21    Minutes 15    METs 2.14      Oxygen   Maintain Oxygen Saturation 88% or higher             Nutrition:  Target Goals: Understanding of nutrition guidelines, daily intake of sodium 1500mg , cholesterol 200mg , calories 30% from fat and 7% or less from saturated fats, daily to have 5 or more servings of fruits and vegetables.  Education: All About Nutrition: -Group instruction provided by verbal, written material, interactive activities, discussions, models, and posters to present general guidelines for heart healthy nutrition including fat, fiber, MyPlate, the role of sodium in heart healthy nutrition, utilization of the nutrition label, and utilization of this knowledge for meal planning. Follow up email sent as well. Written material given at graduation. Flowsheet Row Cardiac Rehab from 12/18/2023 in [facility] Cardiac and Pulmonary Rehab  Education need identified 12/18/23       Biometrics:  Pre Biometrics - 12/18/23 1508       Pre Biometrics   Height 5' 8.4" (1.737 m)    Weight 174 lb 1.6 oz (79 kg)    Waist Circumference 42.5 inches    Hip Circumference 38.5 inches    Waist to Hip Ratio 1.1 %    BMI (Calculated) 26.17    Single Leg  Stand 0.5 seconds              Nutrition Therapy Plan and Nutrition Goals:  Nutrition Therapy & Goals - 12/18/23 1509       Nutrition Therapy   RD appointment deferred Yes      Personal Nutrition Goals   Nutrition Goal RD appointment deferred      Intervention Plan   Intervention Prescribe, educate and counsel regarding individualized specific dietary modifications aiming towards targeted core components such as weight, hypertension, lipid management, diabetes, heart failure and other comorbidities.;Nutrition handout(s) given to patient.    Expected Outcomes Short Term Goal: Understand basic principles of dietary content, such as calories, fat, sodium, cholesterol and  nutrients.             Nutrition Assessments:  MEDIFICTS Score Key: >=70 Need to make dietary changes  40-70 Heart Healthy Diet <= 40 Therapeutic Level Cholesterol Diet  Flowsheet Row Cardiac Rehab from 12/18/2023 in [facility] Cardiac and Pulmonary Rehab  Picture Your Plate Total Score on Admission 67      Picture Your Plate Scores: <21 Unhealthy dietary pattern with much room for improvement. 41-50 Dietary pattern unlikely to meet recommendations for good health and room for improvement. 51-60 More healthful dietary pattern, with some room for improvement.  >60 Healthy dietary pattern, although there may be some specific behaviors that could be improved.    Nutrition Goals Re-Evaluation:   Nutrition Goals Discharge (Final Nutrition Goals Re-Evaluation):   Psychosocial: Target Goals: Acknowledge presence or absence of significant depression and/or stress, maximize coping skills, provide positive support system. Participant is able to verbalize types and ability to use techniques and skills needed for reducing stress and depression.   Education: Stress, Anxiety, and Depression - Group verbal and visual presentation to define topics covered.  Reviews how body is impacted by stress, anxiety, and  depression.  Also discusses healthy ways to reduce stress and to treat/manage anxiety and depression.  Written material given at graduation. Flowsheet Row Cardiac Rehab from 07/15/2023 in [facility] Cardiac and Pulmonary Rehab  Education need identified 07/15/23       Education: Sleep Hygiene -Provides group verbal and written instruction about how sleep can affect your health.  Define sleep hygiene, discuss sleep cycles and impact of sleep habits. Review good sleep hygiene tips.    Initial Review & Psychosocial Screening:  Initial Psych Review & Screening - 12/17/23 1305       Initial Review   Current issues with Current Stress Concerns    Source of Stress Concerns Unable to participate in former interests or hobbies;Unable to perform yard/household activities      Family Dynamics   Good Support System? Yes   wife and family     Barriers   Psychosocial barriers to participate in program There are no identifiable barriers or psychosocial needs.;The patient should benefit from training in stress management and relaxation.      Screening Interventions   Interventions Encouraged to exercise;Provide feedback about the scores to participant;To provide support and resources with identified psychosocial needs    Expected Outcomes Short Term goal: Utilizing psychosocial counselor, staff and physician to assist with identification of specific Stressors or current issues interfering with healing process. Setting desired goal for each stressor or current issue identified.;Long Term Goal: Stressors or current issues are controlled or eliminated.;Short Term goal: Identification and review with participant of any Quality of Life or Depression concerns found by scoring the questionnaire.;Long Term goal: The participant improves quality of Life and PHQ9 Scores as seen by post scores and/or verbalization of changes             Quality of Life Scores:   Quality of Life - 12/18/23 1509       Quality of  Life Scores   Health/Function Pre 25 %    Socioeconomic Pre 27.92 %    Psych/Spiritual Pre 28 %    Family Pre 30 %    GLOBAL Pre 27.09 %            Scores of 19 and below usually indicate a poorer quality of life in these areas.  A difference of  2-3 points is a clinically meaningful difference.  A difference of 2-3 points in the total score of the Quality of Life Index has been associated with significant improvement in overall quality of life, self-image, physical symptoms, and general health in studies assessing change in quality of life.  PHQ-9: Review Flowsheet       12/18/2023 07/15/2023  Depression screen PHQ 2/9  Decreased Interest 0 0  Down, Depressed, Hopeless 0 0  PHQ - 2 Score 0 0  Altered sleeping 0 0  Tired, decreased energy 1 2  Change in appetite 0 1  Feeling bad or failure about yourself  0 0  Trouble concentrating 2 3  Moving slowly or fidgety/restless 3 0  Suicidal thoughts 0 0  PHQ-9 Score 6 6  Difficult doing work/chores Extremely dIfficult Somewhat difficult   Interpretation of Total Score  Total Score Depression Severity:  1-4 = Minimal depression, 5-9 = Mild depression, 10-14 = Moderate depression, 15-19 = Moderately severe depression, 20-27 = Severe depression   Psychosocial Evaluation and Intervention:  Psychosocial Evaluation - 12/17/23 1314       Psychosocial Evaluation & Interventions   Comments Mr. Hoque is coming to cardiac rehab after his TAVR. 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  He had a stroke in the past that caused difficulty in word processing, so his wife mainly spoke on the phone during the orientation phone call. 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 He has been walking more and has more energy. When asked about stress, it was mentioned that he really wants to get back to what he wants to do. His wife helps him when needed and notes that he really enjoyed cardiac rehab in the past so they both think this  will help him get closer to where he wants to be, He has a good support system. He reports no sleep concerns or concerns with anxiety or depression.    Expected Outcomes Short: attend cardiac rehab for education and exercise. Long: devleop and maintain positive self care habits    Continue Psychosocial Services  Follow up required by staff             Psychosocial Re-Evaluation:   Psychosocial Discharge (Final Psychosocial Re-Evaluation):   Vocational Rehabilitation: Provide vocational rehab assistance to qualifying candidates.   Vocational Rehab Evaluation & Intervention:   Education: Education Goals: Education classes will be provided on a variety of topics geared toward better understanding of heart health and risk factor modification. Participant will state understanding/return demonstration of topics presented as noted by education test scores.  Learning Barriers/Preferences:   General Cardiac Education Topics:  AED/CPR: - Group verbal and written instruction with the use of models to demonstrate the basic use of the AED with the basic ABC's of resuscitation.   Anatomy and Cardiac Procedures: - Group verbal and visual presentation and models provide information about basic cardiac anatomy and function. Reviews the testing methods done to diagnose heart disease and the outcomes of the test results. Describes the treatment choices: Medical Management, Angioplasty, or Coronary Bypass Surgery for treating various heart conditions including Myocardial Infarction, Angina, Valve Disease, and Cardiac Arrhythmias.  Written material given at graduation. Flowsheet Row Cardiac Rehab from 12/18/2023 in [facility] Cardiac and Pulmonary Rehab  Education need identified 12/18/23       Medication Safety: - Group verbal and visual instruction to review commonly prescribed medications for heart and lung disease. Reviews the medication, class of the drug, and side effects. Includes the steps to  properly  store meds and maintain the prescription regimen.  Written material given at graduation.   Intimacy: - Group verbal instruction through game format to discuss how heart and lung disease can affect sexual intimacy. Written material given at graduation.. Flowsheet Row Cardiac Rehab from 12/18/2023 in [facility] Cardiac and Pulmonary Rehab  Education need identified 12/18/23       Know Your Numbers and Heart Failure: - Group verbal and visual instruction to discuss disease risk factors for cardiac and pulmonary disease and treatment options.  Reviews associated critical values for Overweight/Obesity, Hypertension, Cholesterol, and Diabetes.  Discusses basics of heart failure: signs/symptoms and treatments.  Introduces Heart Failure Zone chart for action plan for heart failure.  Written material given at graduation.   Infection Prevention: - Provides verbal and written material to individual with discussion of infection control including proper hand washing and proper equipment cleaning during exercise session. Flowsheet Row Cardiac Rehab from 12/18/2023 in [facility] Cardiac and Pulmonary Rehab  Date 12/18/23  Educator MB  Instruction Review Code 1- Verbalizes Understanding       Falls Prevention: - Provides verbal and written material to individual with discussion of falls prevention and safety. Flowsheet Row Cardiac Rehab from 12/18/2023 in [facility] Cardiac and Pulmonary Rehab  Date 12/18/23  Educator MB  Instruction Review Code 1- Verbalizes Understanding       Other: -Provides group and verbal instruction on various topics (see comments)   Knowledge Questionnaire Score:  Knowledge Questionnaire Score - 12/18/23 1511       Knowledge Questionnaire Score   Pre Score 20/26             Core Components/Risk Factors/Patient Goals at Admission:  Personal Goals and Risk Factors at Admission - 12/18/23 1511       Core Components/Risk Factors/Patient Goals on Admission    Weight  Management Yes;Weight Maintenance    Intervention Weight Management: Develop a combined nutrition and exercise program designed to reach desired caloric intake, while maintaining appropriate intake of nutrient and fiber, sodium and fats, and appropriate energy expenditure required for the weight goal.;Weight Management: Provide education and appropriate resources to help participant work on and attain dietary goals.;Weight Management/Obesity: Establish reasonable short term and long term weight goals.    Admit Weight 174 lb 1.6 oz (79 kg)    Goal Weight: Short Term 174 lb (78.9 kg)    Goal Weight: Long Term 174 lb (78.9 kg)    Expected Outcomes Short Term: Continue to assess and modify interventions until short term weight is achieved;Weight Maintenance: Understanding of the daily nutrition guidelines, which includes 25-35% calories from fat, 7% or less cal from saturated fats, less than 200mg  cholesterol, less than 1.5gm of sodium, & 5 or more servings of fruits and vegetables daily;Understanding recommendations for meals to include 15-35% energy as protein, 25-35% energy from fat, 35-60% energy from carbohydrates, less than 200mg  of dietary cholesterol, 20-35 gm of total fiber daily;Understanding of distribution of calorie intake throughout the day with the consumption of 4-5 meals/snacks;Long Term: Adherence to nutrition and physical activity/exercise program aimed toward attainment of established weight goal    Heart Failure Yes    Intervention Provide a combined exercise and nutrition program that is supplemented with education, support and counseling about heart failure. Directed toward relieving symptoms such as shortness of breath, decreased exercise tolerance, and extremity edema.    Expected Outcomes Improve functional capacity of life;Short term: Attendance in program 2-3 days a week with increased exercise capacity. Reported lower sodium intake.  Reported increased fruit and vegetable intake.  Reports medication compliance.;Long term: Adoption of self-care skills and reduction of barriers for early signs and symptoms recognition and intervention leading to self-care maintenance.;Short term: Daily weights obtained and reported for increase. Utilizing diuretic protocols set by physician.    Hypertension Yes    Intervention Provide education on lifestyle modifcations including regular physical activity/exercise, weight management, moderate sodium restriction and increased consumption of fresh fruit, vegetables, and low fat dairy, alcohol moderation, and smoking cessation.;Monitor prescription use compliance.    Expected Outcomes Short Term: Continued assessment and intervention until BP is < 140/74mm HG in hypertensive participants. < 130/55mm HG in hypertensive participants with diabetes, heart failure or chronic kidney disease.;Long Term: Maintenance of blood pressure at goal levels.    Lipids Yes    Intervention Provide education and support for participant on nutrition & aerobic/resistive exercise along with prescribed medications to achieve LDL 70mg , HDL >40mg .    Expected Outcomes Short Term: Participant states understanding of desired cholesterol values and is compliant with medications prescribed. Participant is following exercise prescription and nutrition guidelines.;Long Term: Cholesterol controlled with medications as prescribed, with individualized exercise RX and with personalized nutrition plan. Value goals: LDL < 70mg , HDL > 40 mg.             Education:Diabetes - Individual verbal and written instruction to review signs/symptoms of diabetes, desired ranges of glucose level fasting, after meals and with exercise. Acknowledge that pre and post exercise glucose checks will be done for 3 sessions at entry of program.   Core Components/Risk Factors/Patient Goals Review:    Core Components/Risk Factors/Patient Goals at Discharge (Final Review):    ITP Comments:  ITP  Comments     Row Name 12/17/23 1320 12/18/23 1501 12/23/23 1128 01/15/24 0727     ITP Comments Initial phone call completed. Diagnosis can be found in [facility] 12/12/23. EP Orientation scheduled for Wednesday 1/15 at 1pm. Completed and gym orientation. Initial ITP created and sent for review to Dr. Bethann Punches, Medical Director. First full day of exercise!  Patient was oriented to gym and equipment including functions, settings, policies, and procedures.  Patient's individual exercise prescription and treatment plan were reviewed.  All starting workloads were established based on the results of the 6 minute walk test done at initial orientation visit.  The plan for exercise progression was also introduced and progression will be customized based on patient's performance and goals. 30 Day review completed. Medical Director ITP review done, changes made as directed, and signed approval by Medical Director.    new to program             Comments:

## 2024-01-19 NOTE — Progress Notes (Signed)
  Subjective:  Patient ID: Calvin Jimmy Armand Mickey., male    DOB: 20-Mar-1933,  MRN: [id number]  Calvin Jimmy Armand Mickey. presents to clinic today for at risk foot care with history of peripheral neuropathy and preulcerative lesion(s) left foot and painful mycotic toenails that limit ambulation. Painful toenails interfere with ambulation. Aggravating factors include wearing enclosed shoe gear. Pain is relieved with periodic professional debridement. Painful preulcerative lesion(s) is/are aggravated when weightbearing with and without shoegear. Pain is relieved with periodic professional debridement.  Chief Complaint  Patient presents with   Nail Problem    Pt is here RFC PCP is Dr Auston and LOV was in January.   New problem(s): None.   PCP is Auston Reyes BIRCH, MD.  Allergies  Allergen Reactions   Penicillins Rash    Rash    Rash    Gabapentin    Penicillin G    Codeine Nausea Only and Nausea And Vomiting    Other reaction(s): Vomiting Other reaction(s): Vomiting    Review of Systems: Negative except as noted in the HPI.  Objective:  There were no vitals filed for this visit. Calvin Jimmy [employer]. is a pleasant 89 y.o. male WD, WN in NAD. AAO x 3.  Vascular Examination: Vascular status intact b/l with palpable pedal pulses. CFT immediate b/l. No edema. No pain with calf compression b/l. Skin temperature gradient WNL b/l. Pedal hair sparse.  Neurological Examination: Pt has subjective symptoms of neuropathy.  Dermatological Examination: Pedal skin with normal turgor, texture and tone b/l.   Toenails 1-5 b/l thick, discolored, elongated with subungual debris and pain on dorsal palpation.   Preulcerative lesion noted distal tip of left 3rd toe. There is visible subdermal hemorrhage. HKT right great toe. There is  no edema, no drainage, no odor, no fluctuance. There is blanchable erythema.   Musculoskeletal Examination: Muscle strength 5/5 to b/l LE. Clawtoe  deformity L 3rd toe.  Assessment/Plan: 1. Pain due to onychomycosis of toenail   2. Pre-ulcerative corn or callous   3. Peripheral polyneuropathy     Patient was evaluated and treated. All patient's and/or POA's questions/concerns addressed on today's visit. Mycotic toenails 1-5 debrided in length and girth without incident.  Preulcerative lesion left 3rd toe and right great toe debrided without incident. Continue daily foot inspections and monitor blood glucose per PCP/Endocrinologist's recommendations.Continue soft, supportive shoe gear daily. Report any pedal injuries to medical professional. Call office if there are any quesitons/concerns. -Patient/POA to call should there be question/concern in the interim.   Return in about 9 weeks (around 03/13/2024).  Delon LITTIE Pollard, DPM      [place] LOCATION: [address] [place] [address]                   Office [phone]   [facility] [place] LOCATION: [address] [place], [place] [address] Office [phone]

## 2024-01-27 ENCOUNTER — Encounter: Payer: Medicare Other | Admitting: *Deleted

## 2024-01-27 DIAGNOSIS — Z952 Presence of prosthetic heart valve: Secondary | ICD-10-CM

## 2024-01-27 NOTE — Progress Notes (Signed)
 Daily Session Note  Patient Details  Name: [name]. MRN: [id number] Date of Birth: 23-Oct-1933 Referring Provider:   Flowsheet Row Cardiac Rehab from 12/18/2023 in [facility] Cardiac and Pulmonary Rehab  Referring Provider Aram Beecham, MD       Encounter Date: 01/27/2024  Check In:  Session Check In - 01/27/24 1112       Check-In   Supervising physician immediately available to respond to emergencies See telemetry face sheet for immediately available ER MD    Location [facility]-Cardiac & Pulmonary Rehab    Staff Present Cora Collum, RN, BSN, CCRP;Margaret Best, MS, Exercise Physiologist;Maxon Conetta BS, Exercise Physiologist;Kelly Cloretta Ned, ACSM CEP, Exercise Physiologist    Virtual Visit No    Medication changes reported     No    Fall or balance concerns reported    No    Warm-up and Cool-down Performed on first and last piece of equipment    Resistance Training Performed Yes    VAD Patient? No    PAD/SET Patient? No      Pain Assessment   Currently in Pain? No/denies                Social History   Tobacco Use  Smoking Status Never  Smokeless Tobacco Never    Goals Met:  Independence with exercise equipment Exercise tolerated well No report of concerns or symptoms today  Goals Unmet:  Not Applicable  Comments: Pt able to follow exercise prescription today without complaint.  Will continue to monitor for progression.    Dr. Bethann Punches is Medical Director for [facility] Cardiac Rehabilitation.  Dr. Vida Rigger is Medical Director for [facility] Pulmonary Rehabilitation.

## 2024-01-31 ENCOUNTER — Encounter: Payer: Medicare Other | Admitting: *Deleted

## 2024-01-31 DIAGNOSIS — Z952 Presence of prosthetic heart valve: Secondary | ICD-10-CM | POA: Diagnosis not present

## 2024-01-31 NOTE — Progress Notes (Signed)
 Daily Session Note  Patient Details  Name: [name]. MRN: [id number] Date of Birth: 1933-10-02 Referring Provider:   Flowsheet Row Cardiac Rehab from 12/18/2023 in [facility] Cardiac and Pulmonary Rehab  Referring Provider Aram Beecham, MD       Encounter Date: 01/31/2024  Check In:  Session Check In - 01/31/24 1109       Check-In   Supervising physician immediately available to respond to emergencies See telemetry face sheet for immediately available ER MD    Location [facility]-Cardiac & Pulmonary Rehab    Staff Present Susann Givens RN,BSN;Joseph [facility] [place], [place], Exercise Physiologist    Virtual Visit No    Medication changes reported     No    Fall or balance concerns reported    No    Tobacco Cessation No Change    Warm-up and Cool-down Performed on first and last piece of equipment    Resistance Training Performed Yes    VAD Patient? No    PAD/SET Patient? No      Pain Assessment   Currently in Pain? No/denies                Social History   Tobacco Use  Smoking Status Never  Smokeless Tobacco Never    Goals Met:  Independence with exercise equipment Exercise tolerated well No report of concerns or symptoms today Strength training completed today  Goals Unmet:  Not Applicable  Comments: Pt able to follow exercise prescription today without complaint.  Will continue to monitor for progression.    Dr. Bethann Punches is Medical Director for [facility] Cardiac Rehabilitation.  Dr. Vida Rigger is Medical Director for [facility] Pulmonary Rehabilitation.

## 2024-02-03 ENCOUNTER — Encounter: Payer: Medicare Other | Attending: Cardiology | Admitting: *Deleted

## 2024-02-03 DIAGNOSIS — Z48812 Encounter for surgical aftercare following surgery on the circulatory system: Secondary | ICD-10-CM | POA: Insufficient documentation

## 2024-02-03 DIAGNOSIS — Z952 Presence of prosthetic heart valve: Secondary | ICD-10-CM | POA: Insufficient documentation

## 2024-02-03 NOTE — Progress Notes (Signed)
 Daily Session Note  Patient Details  Name: [name]. MRN: [id number] Date of Birth: 10/20/1933 Referring Provider:   Flowsheet Row Cardiac Rehab from 12/18/2023 in [facility] Cardiac and Pulmonary Rehab  Referring Provider Aram Beecham, MD       Encounter Date: 02/03/2024  Check In:  Session Check In - 02/03/24 1127       Check-In   Supervising physician immediately available to respond to emergencies See telemetry face sheet for immediately available ER MD    Location [facility]-Cardiac & Pulmonary Rehab    Staff Present Susann Givens RN,BSN;Joseph [facility] RCP,RRT,BSRT;Margaret Best, MS, Exercise Physiologist;Kelly Cloretta Ned, ACSM CEP, Exercise Physiologist    Virtual Visit No    Medication changes reported     No    Fall or balance concerns reported    No    Warm-up and Cool-down Performed on first and last piece of equipment    Resistance Training Performed Yes    VAD Patient? No    PAD/SET Patient? No      Pain Assessment   Currently in Pain? No/denies                Social History   Tobacco Use  Smoking Status Never  Smokeless Tobacco Never    Goals Met:  Independence with exercise equipment Exercise tolerated well No report of concerns or symptoms today Strength training completed today  Goals Unmet:  Not Applicable  Comments: Pt able to follow exercise prescription today without complaint.  Will continue to monitor for progression.    Dr. Bethann Punches is Medical Director for [facility] Cardiac Rehabilitation.  Dr. Vida Rigger is Medical Director for [facility] Pulmonary Rehabilitation.

## 2024-02-05 ENCOUNTER — Encounter: Payer: Medicare Other | Admitting: *Deleted

## 2024-02-05 DIAGNOSIS — Z952 Presence of prosthetic heart valve: Secondary | ICD-10-CM | POA: Diagnosis not present

## 2024-02-05 NOTE — Progress Notes (Signed)
 Daily Session Note  Patient Details  Name: [name]. MRN: [id number] Date of Birth: Apr 27, 1933 Referring Provider:   Flowsheet Row Cardiac Rehab from 12/18/2023 in [facility] Cardiac and Pulmonary Rehab  Referring Provider Aram Beecham, MD       Encounter Date: 02/05/2024  Check In:  Session Check In - 02/05/24 1112       Check-In   Supervising physician immediately available to respond to emergencies See telemetry face sheet for immediately available ER MD    Location [facility]-Cardiac & Pulmonary Rehab    Staff Present Cora Collum, RN, BSN, CCRP;Noah Tickle, BS, Exercise Physiologist;Margaret Best, MS, Exercise Physiologist;Joseph Re[facility] RCP,RRT,BSRT    Virtual Visit No    Medication changes reported     No    Fall or balance concerns reported    No    Warm-up and Cool-down Performed on first and last piece of equipment    Resistance Training Performed No    VAD Patient? No    PAD/SET Patient? No      Pain Assessment   Currently in Pain? No/denies                Social History   Tobacco Use  Smoking Status Never  Smokeless Tobacco Never    Goals Met:  Independence with exercise equipment Exercise tolerated well No report of concerns or symptoms today  Goals Unmet:  Not Applicable  Comments: Pt able to follow exercise prescription today without complaint.  Will continue to monitor for progression.    Dr. Bethann Punches is Medical Director for [facility] Cardiac Rehabilitation.  Dr. Vida Rigger is Medical Director for [facility] Pulmonary Rehabilitation.

## 2024-02-10 ENCOUNTER — Encounter: Payer: Medicare Other | Admitting: *Deleted

## 2024-02-10 DIAGNOSIS — Z952 Presence of prosthetic heart valve: Secondary | ICD-10-CM | POA: Diagnosis not present

## 2024-02-10 NOTE — Progress Notes (Signed)
 Daily Session Note  Patient Details  Name: [name]. MRN: [id number] Date of Birth: Nov 04, 1933 Referring Provider:   Flowsheet Row Cardiac Rehab from 12/18/2023 in [facility] Cardiac and Pulmonary Rehab  Referring Provider Aram Beecham, MD       Encounter Date: 02/10/2024  Check In:  Session Check In - 02/10/24 1127       Check-In   Supervising physician immediately available to respond to emergencies See telemetry face sheet for immediately available ER MD    Location [facility]-Cardiac & Pulmonary Rehab    Staff Present Cora Collum, RN, BSN, CCRP;Margaret Best, MS, Exercise Physiologist;Kelly Cloretta Ned, ACSM CEP, Exercise Physiologist;Meredith Jewel Baize RN,BSN    Virtual Visit No    Medication changes reported     No    Fall or balance concerns reported    No    Warm-up and Cool-down Performed on first and last piece of equipment    Resistance Training Performed Yes    VAD Patient? No    PAD/SET Patient? No      Pain Assessment   Currently in Pain? No/denies                Social History   Tobacco Use  Smoking Status Never  Smokeless Tobacco Never    Goals Met:  Independence with exercise equipment Exercise tolerated well No report of concerns or symptoms today  Goals Unmet:  Not Applicable  Comments: Pt able to follow exercise prescription today without complaint.  Will continue to monitor for progression.    Dr. Bethann Punches is Medical Director for [facility] Cardiac Rehabilitation.  Dr. Vida Rigger is Medical Director for [facility] Pulmonary Rehabilitation.

## 2024-02-12 ENCOUNTER — Encounter: Payer: Self-pay | Admitting: *Deleted

## 2024-02-12 ENCOUNTER — Encounter: Payer: Medicare Other | Admitting: *Deleted

## 2024-02-12 DIAGNOSIS — Z952 Presence of prosthetic heart valve: Secondary | ICD-10-CM

## 2024-02-12 NOTE — Progress Notes (Signed)
 Cardiac Individual Treatment Plan  Patient Details  Name: Mekhai Venuto. MRN: [id number] Date of Birth: 10/09/1933 Referring Provider:   Flowsheet Row Cardiac Rehab from 12/18/2023 in [facility] Cardiac and Pulmonary Rehab  Referring Provider Aram Beecham, MD       Initial Encounter Date:  Flowsheet Row Cardiac Rehab from 12/18/2023 in [facility] Cardiac and Pulmonary Rehab  Date 12/18/23       Visit Diagnosis: S/P TAVR (transcatheter aortic valve replacement)  Patient's Home Medications on Admission:  Current Outpatient Medications:    acetaminophen (TYLENOL) 650 MG CR tablet, Take 650 mg by mouth every 8 (eight) hours as needed. , Disp: , Rfl:    apixaban (ELIQUIS) 2.5 MG TABS tablet, Take 1 tablet (2.5 mg total) by mouth 2 (two) times daily., Disp: 180 tablet, Rfl: 3   Ascorbic Acid (VITAMIN C) 1000 MG tablet, Take 1,000 mg by mouth daily., Disp: , Rfl:    atorvastatin (LIPITOR) 20 MG tablet, Take 20 mg by mouth at bedtime., Disp: , Rfl:    b complex vitamins tablet, Take 1 tablet by mouth daily., Disp: , Rfl:    cholecalciferol (VITAMIN D) 1000 units tablet, Take 2,000 Units by mouth daily., Disp: , Rfl:    clopidogrel (PLAVIX) 75 MG tablet, Take 1 tablet (75 mg total) by mouth daily., Disp: 90 tablet, Rfl: 0   cyanocobalamin (VITAMIN B12) 1000 MCG tablet, Take 1,000 mcg by mouth daily., Disp: , Rfl:    docusate sodium (COLACE) 100 MG capsule, Take 100 mg by mouth 2 (two) times daily., Disp: , Rfl:    empagliflozin (JARDIANCE) 25 MG TABS tablet, Take 12.5 mg by mouth., Disp: , Rfl:    ezetimibe (ZETIA) 10 MG tablet, Take 1 tablet (10 mg total) by mouth daily., Disp: 90 tablet, Rfl: 3   FLAXSEED, LINSEED, PO, Take 2 g by mouth daily., Disp: , Rfl:    furosemide (LASIX) 20 MG tablet, Take 20 mg by mouth as needed., Disp: , Rfl:    metoprolol succinate (TOPROL-XL) 25 MG 24 hr tablet, Take 12.5 mg by mouth., Disp: , Rfl:    nitroGLYCERIN (NITROSTAT) 0.4 MG SL tablet, Place 1  tablet (0.4 mg total) under the tongue every 5 (five) minutes x 3 doses as needed for chest pain., Disp: 25 tablet, Rfl: 0   pantoprazole (PROTONIX) 40 MG tablet, Take 1 tablet (40 mg total) by mouth 2 (two) times daily., Disp: 60 tablet, Rfl: 1   potassium chloride (KLOR-CON) 10 MEQ tablet, Take 10 mEq by mouth as needed., Disp: , Rfl:    pregabalin (LYRICA) 100 MG capsule, Take 100 mg by mouth in the morning., Disp: , Rfl:    pregabalin (LYRICA) 150 MG capsule, Take 150 mg by mouth at bedtime., Disp: , Rfl:    Probiotic Product (UP4 PROBIOTICS PO), Take 7.5 mg by mouth., Disp: , Rfl:    Psyllium (GERI-MUCIL) 25 % POWD, Take by mouth., Disp: , Rfl:    ranolazine (RANEXA) 500 MG 12 hr tablet, Take 1 tablet (500 mg total) by mouth 2 (two) times daily., Disp: 180 tablet, Rfl: 3   tamsulosin (FLOMAX) 0.4 MG CAPS capsule, Take 2 capsules (0.8 mg total) by mouth daily., Disp: 30 capsule, Rfl: 0   zinc sulfate 220 (50 Zn) MG capsule, Take 1 tablet by mouth daily., Disp: , Rfl:   Past Medical History: Past Medical History:  Diagnosis Date   Acute embolic stroke (HCC) 02/01/2017   BPH (benign prostatic hyperplasia)    Bradycardia  11/10/2015   Coronary artery disease    Dizziness 08/21/2012   DYSPEPSIA 07/31/2010   Qualifier: Diagnosis of  By: Janee Morn, RN, Ashley     Hyperlipidemia 05/16/2010   Qualifier: Diagnosis of  By: Mariah Milling MD, [name]      Hypertension    HYPERTENSION, BENIGN 05/16/2010   Qualifier: Diagnosis of  By: Mariah Milling MD, [name]     Stenosis of right carotid artery 02/01/2017    Tobacco Use: Social History   Tobacco Use  Smoking Status Never  Smokeless Tobacco Never    Labs: Review Flowsheet       Latest Ref Rng & Units 01/23/2010 06/15/2023 06/16/2023  Labs for ITP Cardiac and Pulmonary Rehab  Cholestrol 0 - 200 mg/dL 161  - 90   LDL (calc) 0 - 99 mg/dL 09.6  - 44   HDL-C >04 mg/dL 54.0  - 40   Trlycerides <150 mg/dL 93  - 30   Hemoglobin J8J 4.8 - 5.6 % 5.4  6.3  6.3  -     Details       Multiple values from one day are sorted in reverse-chronological order          Exercise Target Goals: Exercise Program Goal: Individual exercise prescription set using results from initial 6 min walk test and THRR while considering  patient's activity barriers and safety.   Exercise Prescription Goal: Initial exercise prescription builds to 30-45 minutes a day of aerobic activity, 2-3 days per week.  Home exercise guidelines will be given to patient during program as part of exercise prescription that the participant will acknowledge.   Education: Aerobic Exercise: - Group verbal and visual presentation on the components of exercise prescription. Introduces F.I.T.T principle from ACSM for exercise prescriptions.  Reviews F.I.T.T. principles of aerobic exercise including progression. Written material given at graduation. Flowsheet Row Cardiac Rehab from 12/18/2023 in [facility] Cardiac and Pulmonary Rehab  Education need identified 12/18/23       Education: Resistance Exercise: - Group verbal and visual presentation on the components of exercise prescription. Introduces F.I.T.T principle from ACSM for exercise prescriptions  Reviews F.I.T.T. principles of resistance exercise including progression. Written material given at graduation.    Education: Exercise & Equipment Safety: - Individual verbal instruction and demonstration of equipment use and safety with use of the equipment. Flowsheet Row Cardiac Rehab from 12/18/2023 in [facility] Cardiac and Pulmonary Rehab  Date 12/18/23  Educator MB  Instruction Review Code 1- Verbalizes Understanding       Education: Exercise Physiology & General Exercise Guidelines: - Group verbal and written instruction with models to review the exercise physiology of the cardiovascular system and associated critical values. Provides general exercise guidelines with specific guidelines to those with heart or lung disease.  Flowsheet Row Cardiac  Rehab from 12/18/2023 in [facility] Cardiac and Pulmonary Rehab  Education need identified 12/18/23       Education: Flexibility, Balance, Mind/Body Relaxation: - Group verbal and visual presentation with interactive activity on the components of exercise prescription. Introduces F.I.T.T principle from ACSM for exercise prescriptions. Reviews F.I.T.T. principles of flexibility and balance exercise training including progression. Also discusses the mind body connection.  Reviews various relaxation techniques to help reduce and manage stress (i.e. Deep breathing, progressive muscle relaxation, and visualization). Balance handout provided to take home. Written material given at graduation.   Activity Barriers & Risk Stratification:  Activity Barriers & Cardiac Risk Stratification - 12/18/23 1503       Activity Barriers & Cardiac Risk Stratification  Activity Barriers Arthritis;Back Problems;Balance Concerns;Muscular Weakness    Cardiac Risk Stratification High             6 Minute Walk:  6 Minute Walk     Row Name 12/18/23 1501         6 Minute Walk   Phase Initial     Distance 805 feet     Walk Time 6 minutes     # of Rest Breaks 0     MPH 1.52     METS 1.16     RPE 11     Perceived Dyspnea  0     VO2 Peak 4.06     Symptoms No     Resting HR 64 bpm     Resting BP 146/86     Resting Oxygen Saturation  97 %     Exercise Oxygen Saturation  during 6 min walk 96 %     Max Ex. HR 90 bpm     Max Ex. BP 130/60     2 Minute Post BP 120/64              Oxygen Initial Assessment:   Oxygen Re-Evaluation:   Oxygen Discharge (Final Oxygen Re-Evaluation):   Initial Exercise Prescription:  Initial Exercise Prescription - 12/18/23 1500       Date of Initial Exercise RX and Referring Provider   Date 12/18/23    Referring Provider Aram Beecham, MD      Oxygen   Maintain Oxygen Saturation 88% or higher      Recumbant Bike   Level 1    RPM 50    Watts 15     Minutes 15    METs 1.16      NuStep   Level 1    SPM 80    Minutes 15    METs 1.16      Biostep-RELP   Level 1    Minutes 15    METs 1.16      Track   Laps 21    Minutes 15    METs 1.16      Prescription Details   Frequency (times per week) 2    Duration Progress to 30 minutes of continuous aerobic without signs/symptoms of physical distress      Intensity   THRR 40-80% of Max Heartrate 90-118    Ratings of Perceived Exertion 11-13    Perceived Dyspnea 0-4      Progression   Progression Continue to progress workloads to maintain intensity without signs/symptoms of physical distress.      Resistance Training   Training Prescription Yes    Weight 2lb    Reps 10-15             Perform Capillary Blood Glucose checks as needed.  Exercise Prescription Changes:   Exercise Prescription Changes     Row Name 12/18/23 1500 01/07/24 1500 01/23/24 1600 02/04/24 1500       Response to Exercise   Blood Pressure (Admit) 146/86 124/70 122/64 120/64    Blood Pressure (Exercise) 130/60 126/70 130/80 118/62    Blood Pressure (Exit) 120/64 124/70 110/72 118/60    Heart Rate (Admit) 64 bpm 66 bpm 69 bpm 75 bpm    Heart Rate (Exercise) 90 bpm 101 bpm 100 bpm 95 bpm    Heart Rate (Exit) 62 bpm 73 bpm 67 bpm 78 bpm    Oxygen Saturation (Admit) 97 % -- -- --    Oxygen Saturation (Exercise) 96 % -- -- --  Oxygen Saturation (Exit) 96 % -- -- --    Rating of Perceived Exertion (Exercise) 11 13 13 14     Perceived Dyspnea (Exercise) 0 0 0 --    Symptoms none none none none    Comments results First 2 weeks of exercise -- --    Duration Progress to 30 minutes of  aerobic without signs/symptoms of physical distress Progress to 30 minutes of  aerobic without signs/symptoms of physical distress Progress to 30 minutes of  aerobic without signs/symptoms of physical distress Continue with 30 min of aerobic exercise without signs/symptoms of physical distress.    Intensity THRR New  THRR New THRR New THRR unchanged      Progression   Progression Continue to progress workloads to maintain intensity without signs/symptoms of physical distress. Continue to progress workloads to maintain intensity without signs/symptoms of physical distress. Continue to progress workloads to maintain intensity without signs/symptoms of physical distress. Continue to progress workloads to maintain intensity without signs/symptoms of physical distress.    Average METs 1.16 1.96 1.96 1.8      Resistance Training   Training Prescription -- Yes Yes Yes    Weight -- 2lb 2lb 2lb    Reps -- 10-15 10-15 10-15      Interval Training   Interval Training -- No No No      NuStep   Level -- 2  T6 3 2    Minutes -- 15 15 15     METs -- 1.9 1.8 1.36      T5 Nustep   Level -- -- 2  T6 2  T6    Minutes -- -- 15 15    METs -- -- 1.6 2      Biostep-RELP   Level -- 1 -- --    Minutes -- 15 -- --    METs -- 2 -- --      Track   Laps -- 21 21 21     Minutes -- 15 15 15     METs -- 2.14 2.14 2.14      Oxygen   Maintain Oxygen Saturation -- 88% or higher 88% or higher 88% or higher             Exercise Comments:   Exercise Comments     Row Name 12/23/23 1128           Exercise Comments First full day of exercise!  Patient was oriented to gym and equipment including functions, settings, policies, and procedures.  Patient's individual exercise prescription and treatment plan were reviewed.  All starting workloads were established based on the results of the 6 minute walk test done at initial orientation visit.  The plan for exercise progression was also introduced and progression will be customized based on patient's performance and goals.                Exercise Goals and Review:   Exercise Goals     Row Name 12/18/23 1507             Exercise Goals   Increase Physical Activity Yes       Intervention Provide advice, education, support and counseling about physical  activity/exercise needs.;Develop an individualized exercise prescription for aerobic and resistive training based on initial evaluation findings, risk stratification, comorbidities and participant's personal goals.       Expected Outcomes Short Term: Attend rehab on a regular basis to increase amount of physical activity.;Long Term: Add in home exercise to make exercise  part of routine and to increase amount of physical activity.;Long Term: Exercising regularly at least 3-5 days a week.       Increase Strength and Stamina Yes       Intervention Provide advice, education, support and counseling about physical activity/exercise needs.;Develop an individualized exercise prescription for aerobic and resistive training based on initial evaluation findings, risk stratification, comorbidities and participant's personal goals.       Expected Outcomes Short Term: Increase workloads from initial exercise prescription for resistance, speed, and METs.;Short Term: Perform resistance training exercises routinely during rehab and add in resistance training at home;Long Term: Improve cardiorespiratory fitness, muscular endurance and strength as measured by increased METs and functional capacity ( )       Able to understand and use rate of perceived exertion (RPE) scale Yes       Intervention Provide education and explanation on how to use RPE scale       Expected Outcomes Short Term: Able to use RPE daily in rehab to express subjective intensity level;Long Term:  Able to use RPE to guide intensity level when exercising independently       Able to understand and use Dyspnea scale Yes       Intervention Provide education and explanation on how to use Dyspnea scale       Expected Outcomes Short Term: Able to use Dyspnea scale daily in rehab to express subjective sense of shortness of breath during exertion;Long Term: Able to use Dyspnea scale to guide intensity level when exercising independently       Knowledge and  understanding of Target Heart Rate Range (THRR) Yes       Intervention Provide education and explanation of THRR including how the numbers were predicted and where they are located for reference       Expected Outcomes Short Term: Able to state/look up THRR;Short Term: Able to use daily as guideline for intensity in rehab;Long Term: Able to use THRR to govern intensity when exercising independently       Able to check pulse independently Yes       Intervention Provide education and demonstration on how to check pulse in carotid and radial arteries.;Review the importance of being able to check your own pulse for safety during independent exercise       Expected Outcomes Short Term: Able to explain why pulse checking is important during independent exercise;Long Term: Able to check pulse independently and accurately       Understanding of Exercise Prescription Yes       Intervention Provide education, explanation, and written materials on patient's individual exercise prescription       Expected Outcomes Short Term: Able to explain program exercise prescription;Long Term: Able to explain home exercise prescription to exercise independently                Exercise Goals Re-Evaluation :  Exercise Goals Re-Evaluation     Row Name 12/23/23 1129 01/07/24 1528 01/23/24 1654 02/04/24 1526       Exercise Goal Re-Evaluation   Exercise Goals Review Increase Physical Activity;Able to understand and use rate of perceived exertion (RPE) scale;Knowledge and understanding of Target Heart Rate Range (THRR);Understanding of Exercise Prescription;Increase Strength and Stamina;Able to check pulse independently Increase Physical Activity;Increase Strength and Stamina;Understanding of Exercise Prescription Increase Physical Activity;Increase Strength and Stamina;Understanding of Exercise Prescription Increase Physical Activity;Increase Strength and Stamina;Understanding of Exercise Prescription    Comments Reviewed  RPE and dyspnea scale, THR and program prescription with pt  today.  Pt voiced understanding and was given a copy of goals to take home. Crist is off to a good start in the program. He was able to increase his level on the T6 nustep to level 2. He was also able to maintain his track laps, and level on the biostep. We will continue to monitor his progress in the program. Jaysten is doing well in rehab. He was recently able to increase his level on the T4 nustep from 2 to 3. He was also able to maintain 21 laps on the track in 15 minutes. We will continue to monitor his progress in the program. Hersh is doing well in rehab. He was able to maintain 21 laps on the track and level 2 on the T6 nustep. We will continue to monitor his progress in the program.    Expected Outcomes Short: Use RPE daily to regulate intensity.  Long: Follow program prescription in THR. Short: Continue to increase level on the T6 nustep, and push for more laps on the track. Long: Continue exercise to improve strength and stamina. Short: Continue to increase level on the T6 nustep, and push for more laps on the track. Long: Continue exercise to improve strength and stamina. Short: Continue to push for more laps on the track. Long: Continue exercise to improve strength and stamina.             Discharge Exercise Prescription (Final Exercise Prescription Changes):  Exercise Prescription Changes - 02/04/24 1500       Response to Exercise   Blood Pressure (Admit) 120/64    Blood Pressure (Exercise) 118/62    Blood Pressure (Exit) 118/60    Heart Rate (Admit) 75 bpm    Heart Rate (Exercise) 95 bpm    Heart Rate (Exit) 78 bpm    Rating of Perceived Exertion (Exercise) 14    Symptoms none    Duration Continue with 30 min of aerobic exercise without signs/symptoms of physical distress.    Intensity THRR unchanged      Progression   Progression Continue to progress workloads to maintain intensity without signs/symptoms of  physical distress.    Average METs 1.8      Resistance Training   Training Prescription Yes    Weight 2lb    Reps 10-15      Interval Training   Interval Training No      NuStep   Level 2    Minutes 15    METs 1.36      T5 Nustep   Level 2   T6   Minutes 15    METs 2      Track   Laps 21    Minutes 15    METs 2.14      Oxygen   Maintain Oxygen Saturation 88% or higher             Nutrition:  Target Goals: Understanding of nutrition guidelines, daily intake of sodium 1500mg , cholesterol 200mg , calories 30% from fat and 7% or less from saturated fats, daily to have 5 or more servings of fruits and vegetables.  Education: All About Nutrition: -Group instruction provided by verbal, written material, interactive activities, discussions, models, and posters to present general guidelines for heart healthy nutrition including fat, fiber, MyPlate, the role of sodium in heart healthy nutrition, utilization of the nutrition label, and utilization of this knowledge for meal planning. Follow up email sent as well. Written material given at graduation. Flowsheet Row Cardiac Rehab from 12/18/2023  in [facility] Cardiac and Pulmonary Rehab  Education need identified 12/18/23       Biometrics:  Pre Biometrics - 12/18/23 1508       Pre Biometrics   Height 5' 8.4" (1.737 m)    Weight 174 lb 1.6 oz (79 kg)    Waist Circumference 42.5 inches    Hip Circumference 38.5 inches    Waist to Hip Ratio 1.1 %    BMI (Calculated) 26.17    Single Leg Stand 0.5 seconds              Nutrition Therapy Plan and Nutrition Goals:  Nutrition Therapy & Goals - 12/18/23 1509       Nutrition Therapy   RD appointment deferred Yes      Personal Nutrition Goals   Nutrition Goal RD appointment deferred      Intervention Plan   Intervention Prescribe, educate and counsel regarding individualized specific dietary modifications aiming towards targeted core components such as weight,  hypertension, lipid management, diabetes, heart failure and other comorbidities.;Nutrition handout(s) given to patient.    Expected Outcomes Short Term Goal: Understand basic principles of dietary content, such as calories, fat, sodium, cholesterol and nutrients.             Nutrition Assessments:  MEDIFICTS Score Key: >=70 Need to make dietary changes  40-70 Heart Healthy Diet <= 40 Therapeutic Level Cholesterol Diet  Flowsheet Row Cardiac Rehab from 12/18/2023 in [facility] Cardiac and Pulmonary Rehab  Picture Your Plate Total Score on Admission 67      Picture Your Plate Scores: <16 Unhealthy dietary pattern with much room for improvement. 41-50 Dietary pattern unlikely to meet recommendations for good health and room for improvement. 51-60 More healthful dietary pattern, with some room for improvement.  >60 Healthy dietary pattern, although there may be some specific behaviors that could be improved.    Nutrition Goals Re-Evaluation:   Nutrition Goals Discharge (Final Nutrition Goals Re-Evaluation):   Psychosocial: Target Goals: Acknowledge presence or absence of significant depression and/or stress, maximize coping skills, provide positive support system. Participant is able to verbalize types and ability to use techniques and skills needed for reducing stress and depression.   Education: Stress, Anxiety, and Depression - Group verbal and visual presentation to define topics covered.  Reviews how body is impacted by stress, anxiety, and depression.  Also discusses healthy ways to reduce stress and to treat/manage anxiety and depression.  Written material given at graduation. Flowsheet Row Cardiac Rehab from 07/15/2023 in [facility] Cardiac and Pulmonary Rehab  Education need identified 07/15/23       Education: Sleep Hygiene -Provides group verbal and written instruction about how sleep can affect your health.  Define sleep hygiene, discuss sleep cycles and impact of sleep  habits. Review good sleep hygiene tips.    Initial Review & Psychosocial Screening:  Initial Psych Review & Screening - 12/17/23 1305       Initial Review   Current issues with Current Stress Concerns    Source of Stress Concerns Unable to participate in former interests or hobbies;Unable to perform yard/household activities      Family Dynamics   Good Support System? Yes   wife and family     Barriers   Psychosocial barriers to participate in program There are no identifiable barriers or psychosocial needs.;The patient should benefit from training in stress management and relaxation.      Screening Interventions   Interventions Encouraged to exercise;Provide feedback about the scores to participant;To  provide support and resources with identified psychosocial needs    Expected Outcomes Short Term goal: Utilizing psychosocial counselor, staff and physician to assist with identification of specific Stressors or current issues interfering with healing process. Setting desired goal for each stressor or current issue identified.;Long Term Goal: Stressors or current issues are controlled or eliminated.;Short Term goal: Identification and review with participant of any Quality of Life or Depression concerns found by scoring the questionnaire.;Long Term goal: The participant improves quality of Life and PHQ9 Scores as seen by post scores and/or verbalization of changes             Quality of Life Scores:   Quality of Life - 12/18/23 1509       Quality of Life Scores   Health/Function Pre 25 %    Socioeconomic Pre 27.92 %    Psych/Spiritual Pre 28 %    Family Pre 30 %    GLOBAL Pre 27.09 %            Scores of 19 and below usually indicate a poorer quality of life in these areas.  A difference of  2-3 points is a clinically meaningful difference.  A difference of 2-3 points in the total score of the Quality of Life Index has been associated with significant improvement in overall  quality of life, self-image, physical symptoms, and general health in studies assessing change in quality of life.  PHQ-9: Review Flowsheet       12/18/2023 07/15/2023  Depression screen PHQ 2/9  Decreased Interest 0 0  Down, Depressed, Hopeless 0 0  PHQ - 2 Score 0 0  Altered sleeping 0 0  Tired, decreased energy 1 2  Change in appetite 0 1  Feeling bad or failure about yourself  0 0  Trouble concentrating 2 3  Moving slowly or fidgety/restless 3 0  Suicidal thoughts 0 0  PHQ-9 Score 6 6  Difficult doing work/chores Extremely dIfficult Somewhat difficult   Interpretation of Total Score  Total Score Depression Severity:  1-4 = Minimal depression, 5-9 = Mild depression, 10-14 = Moderate depression, 15-19 = Moderately severe depression, 20-27 = Severe depression   Psychosocial Evaluation and Intervention:  Psychosocial Evaluation - 12/17/23 1314       Psychosocial Evaluation & Interventions   Comments Mr. Clingan is coming to cardiac rehab after his TAVR. 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  He had a stroke in the past that caused difficulty in word processing, so his wife mainly spoke on the phone during the orientation phone call. 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 He has been walking more and has more energy. When asked about stress, it was mentioned that he really wants to get back to what he wants to do. His wife helps him when needed and notes that he really enjoyed cardiac rehab in the past so they both think this will help him get closer to where he wants to be, He has a good support system. He reports no sleep concerns or concerns with anxiety or depression.    Expected Outcomes Short: attend cardiac rehab for education and exercise. Long: devleop and maintain positive self care habits    Continue Psychosocial Services  Follow up required by staff             Psychosocial Re-Evaluation:   Psychosocial Discharge (Final Psychosocial  Re-Evaluation):   Vocational Rehabilitation: Provide vocational rehab assistance to qualifying candidates.   Vocational Rehab Evaluation &  Intervention:   Education: Education Goals: Education classes will be provided on a variety of topics geared toward better understanding of heart health and risk factor modification. Participant will state understanding/return demonstration of topics presented as noted by education test scores.  Learning Barriers/Preferences:   General Cardiac Education Topics:  AED/CPR: - Group verbal and written instruction with the use of models to demonstrate the basic use of the AED with the basic ABC's of resuscitation.   Anatomy and Cardiac Procedures: - Group verbal and visual presentation and models provide information about basic cardiac anatomy and function. Reviews the testing methods done to diagnose heart disease and the outcomes of the test results. Describes the treatment choices: Medical Management, Angioplasty, or Coronary Bypass Surgery for treating various heart conditions including Myocardial Infarction, Angina, Valve Disease, and Cardiac Arrhythmias.  Written material given at graduation. Flowsheet Row Cardiac Rehab from 12/18/2023 in [facility] Cardiac [facility] Rehab  Education need identified 12/18/23       Medication Safety: - Group verbal and visual instruction to review commonly prescribed medications for heart and lung disease. Reviews the medication, class of the drug, and side effects. Includes the steps to properly store meds and maintain the prescription regimen.  Written material given at graduation.   Intimacy: - Group verbal instruction through game format to discuss how heart and lung disease can affect sexual intimacy. Written material given at graduation.. Flowsheet Row Cardiac Rehab from 12/18/2023 in [facility] Cardiac and Pulmonary Rehab  Education need identified 12/18/23       Know Your Numbers and Heart Failure: - Group  verbal and visual instruction to discuss disease risk factors for cardiac and pulmonary disease and treatment options.  Reviews associated critical values for Overweight/Obesity, Hypertension, Cholesterol, and Diabetes.  Discusses basics of heart failure: signs/symptoms and treatments.  Introduces Heart Failure Zone chart for action plan for heart failure.  Written material given at graduation.   Infection Prevention: - Provides verbal and written material to individual with discussion of infection control including proper hand washing and proper equipment cleaning during exercise session. Flowsheet Row Cardiac Rehab from 12/18/2023 in [facility] Cardiac and Pulmonary Rehab  Date 12/18/23  Educator MB  Instruction Review Code 1- Verbalizes Understanding       Falls Prevention: - Provides verbal and written material to individual with discussion of falls prevention and safety. Flowsheet Row Cardiac Rehab from 12/18/2023 in [facility] Cardiac and Pulmonary Rehab  Date 12/18/23  Educator MB  Instruction Review Code 1- Verbalizes Understanding       Other: -Provides group and verbal instruction on various topics (see comments)   Knowledge Questionnaire Score:  Knowledge Questionnaire Score - 12/18/23 1511       Knowledge Questionnaire Score   Pre Score 20/26             Core Components/Risk Factors/Patient Goals at Admission:  Personal Goals and Risk Factors at Admission - 12/18/23 1511       Core Components/Risk Factors/Patient Goals on Admission    Weight Management Yes;Weight Maintenance    Intervention Weight Management: Develop a combined nutrition and exercise program designed to reach desired caloric intake, while maintaining appropriate intake of nutrient and fiber, sodium and fats, and appropriate energy expenditure required for the weight goal.;Weight Management: Provide education and appropriate resources to help participant work on and attain dietary goals.;Weight  Management/Obesity: Establish reasonable short term and long term weight goals.    Admit Weight 174 lb 1.6 oz (79 kg)    Goal  Weight: Short Term 174 lb (78.9 kg)    Goal Weight: Long Term 174 lb (78.9 kg)    Expected Outcomes Short Term: Continue to assess and modify interventions until short term weight is achieved;Weight Maintenance: Understanding of the daily nutrition guidelines, which includes 25-35% calories from fat, 7% or less cal from saturated fats, less than 200mg  cholesterol, less than 1.5gm of sodium, & 5 or more servings of fruits and vegetables daily;Understanding recommendations for meals to include 15-35% energy as protein, 25-35% energy from fat, 35-60% energy from carbohydrates, less than 200mg  of dietary cholesterol, 20-35 gm of total fiber daily;Understanding of distribution of calorie intake throughout the day with the consumption of 4-5 meals/snacks;Long Term: Adherence to nutrition and physical activity/exercise program aimed toward attainment of established weight goal    Heart Failure Yes    Intervention Provide a combined exercise and nutrition program that is supplemented with education, support and counseling about heart failure. Directed toward relieving symptoms such as shortness of breath, decreased exercise tolerance, and extremity edema.    Expected Outcomes Improve functional capacity of life;Short term: Attendance in program 2-3 days a week with increased exercise capacity. Reported lower sodium intake. Reported increased fruit and vegetable intake. Reports medication compliance.;Long term: Adoption of self-care skills and reduction of barriers for early signs and symptoms recognition and intervention leading to self-care maintenance.;Short term: Daily weights obtained and reported for increase. Utilizing diuretic protocols set by physician.    Hypertension Yes    Intervention Provide education on lifestyle modifcations including regular physical activity/exercise, weight  management, moderate sodium restriction and increased consumption of fresh fruit, vegetables, and low fat dairy, alcohol moderation, and smoking cessation.;Monitor prescription use compliance.    Expected Outcomes Short Term: Continued assessment and intervention until BP is < 140/16mm HG in hypertensive participants. < 130/18mm HG in hypertensive participants with diabetes, heart failure or chronic kidney disease.;Long Term: Maintenance of blood pressure at goal levels.    Lipids Yes    Intervention Provide education and support for participant on nutrition & aerobic/resistive exercise along with prescribed medications to achieve LDL 70mg , HDL >40mg .    Expected Outcomes Short Term: Participant states understanding of desired cholesterol values and is compliant with medications prescribed. Participant is following exercise prescription and nutrition guidelines.;Long Term: Cholesterol controlled with medications as prescribed, with individualized exercise RX and with personalized nutrition plan. Value goals: LDL < 70mg , HDL > 40 mg.             Education:Diabetes - Individual verbal and written instruction to review signs/symptoms of diabetes, desired ranges of glucose level fasting, after meals and with exercise. Acknowledge that pre and post exercise glucose checks will be done for 3 sessions at entry of program.   Core Components/Risk Factors/Patient Goals Review:    Core Components/Risk Factors/Patient Goals at Discharge (Final Review):    ITP Comments:  ITP Comments     Row Name 12/17/23 1320 12/18/23 1501 12/23/23 1128 01/15/24 0727 02/12/24 0757   ITP Comments Initial phone call completed. Diagnosis can be found in [facility] 12/12/[facility]. EP Orientation scheduled for Wednesday 1/15 at 1pm. Completed and gym orientation. Initial ITP created and sent for review to Dr. Bethann Punches, Medical Director. First full day of exercise!  Patient was oriented to gym and equipment including functions,  settings, policies, and procedures.  Patient's individual exercise prescription and treatment plan were reviewed.  All starting workloads were established based on the results of the 6 minute walk test done at initial  orientation visit.  The plan for exercise progression was also introduced and progression will be customized based on patient's performance and goals. 30 Day review completed. Medical Director ITP review done, changes made as directed, and signed approval by Medical Director.    new to program 30 Day review completed. Medical Director ITP review done, changes made as directed, and signed approval by Medical Director.            Comments:

## 2024-02-12 NOTE — Progress Notes (Signed)
 Daily Session Note  Patient Details  Name: [name]. MRN: [id number] Date of Birth: 1933-01-13 Referring Provider:   Flowsheet Row Cardiac Rehab from 12/18/2023 in [facility] Cardiac and Pulmonary Rehab  Referring Provider Aram Beecham, MD       Encounter Date: 02/12/2024  Check In:  Session Check In - 02/12/24 1122       Check-In   Supervising physician immediately available to respond to emergencies See telemetry face sheet for immediately available ER MD    Location [facility]-Cardiac & Pulmonary Rehab    Staff Present Cora Collum, RN, BSN, CCRP;Maxon Conetta BS, Exercise Physiologist;Joseph Hood RCP,RRT,BSRT;Noah Tickle, BS, Exercise Physiologist    Virtual Visit No    Medication changes reported     No    Fall or balance concerns reported    No    Warm-up and Cool-down Performed on first and last piece of equipment    Resistance Training Performed Yes    VAD Patient? No    PAD/SET Patient? No      Pain Assessment   Currently in Pain? No/denies                Social History   Tobacco Use  Smoking Status Never  Smokeless Tobacco Never    Goals Met:  Independence with exercise equipment Exercise tolerated well No report of concerns or symptoms today  Goals Unmet:  Not Applicable  Comments: Pt able to follow exercise prescription today without complaint.  Will continue to monitor for progression.    Dr. Bethann Punches is Medical Director for [facility] Cardiac Rehabilitation.  Dr. Vida Rigger is Medical Director for [facility] Pulmonary Rehabilitation.

## 2024-02-16 NOTE — Progress Notes (Unsigned)
 Cardiology Office Note  Date:  02/17/2024   ID:  [name] [name]., DOB October 05, 1933, MRN [id number]  PCP:  [name], MD   Chief Complaint  Patient presents with   3 month follow up     "Doing well."     HPI:  [name] is a 89 year-old gentleman with a history of  coronary artery disease,   stent placed to his proximal RCA in September 2007, taxis stent 3.0 x 24 mm, previous syncopal episode (Etiology of his spell is uncertain),  50% carotid disease on the right CVA 2018, History of peripheral vision issues  EF 35 to 40% aortic valve stenosis, s/p TAVR temp perm placement for CHB immediately following the procedure, but did not require permanent pacemaker  bleeding ulcer in 2024 Feb with 2 units of transfusion  who presents for routine followup of his coronary artery disease,  Tremor, and stroke, PAF, s/p TAVR 11/24  Last seen by myself in clinic November 2024 Reports receiving care at both [facility] cardiology and [facility] cardiology  Two-week holter monitor November 2024 after TAVR discharge demonstrated predominantly sinus rhythm with HR range 41-127, 3 episodes NSVT, ventricular ectopy burden2%, 3% afib burden with no RVR.   In follow-up today he presents with his wife, Reports having chronic leg pain Timing of pain unclear reports it has been there over the past month Possibly when working with PT  ABI and LE arterial doppler normal August 2024  Rare nosebleeds Continues on Eliquis 2.5 twice daily managed by [facility] Creatinine 1.3, weight over 60 kg  Denies chest pain concerning for angina  EKG personally reviewed by myself on todays visit EKG Interpretation Date/Time:  Monday February 17 2024 13:59:17 EDT Ventricular Rate:  71 PR Interval:    QRS Duration:  162 QT Interval:  460 QTC Calculation: 499 R Axis:   -52  Text Interpretation: Normal sinus rhythm Left axis deviation Left bundle branch block When compared with ECG of 14-Nov-2023 08:22, Left bundle  branch block has replaced Non-specific intra-ventricular conduction block Borderline criteria for Anterior infarct are no longer Present Borderline criteria for Anterolateral infarct are no longer Present Criteria for Inferior infarct are no longer Present Confirmed by [name] ([id number]) on 02/17/2024 2:17:15 PM   Additional cardiac records reviewed  hospital non-STEMI June 2024 troponin 1300 Cardiac catheterization June 15, 2023 Severe multivessel coronary disease with unclear culprit Moderately reduced ejection fraction Transfer to [facility] Not a candidate for CABG Left circumflex felt to be subtotal, very small in caliber, diffuse moderate disease of mid LAD and large diagonal with heavy calcification  RCA with critical ostial and mid vessel disease with stents between with moderate in-stent restenosis, entire RCA severely calcified   Echo June 16, 2023 EF 35 to 40% Moderate aortic valve stenosis mean gradient 16 mmHg, peak gradient 25, estimated aortic valve area 0.8, Low-flow, low gradient  Seen by [facility] cardiology July 12, 2023 Statin held, spironolactone 12.53 days a week, Jardiance 10, Imdur held, Lasix as needed  On further follow-up losartan added but caused orthostasis symptoms  Echo at [facility] September 05, 2023 Aortic valve stenosis mean gradient 21 mmHg peak gradient 35 Moderate MR EF essentially unchanged 35%   TAVR end of November 2024  EKG personally reviewed by myself on todays visit  Admitted to the hospital with chest pain May 11, 2023 Troponin 1324 Cardiac catheterization performed June 15, 2023 Severe multivessel coronary disease with unclear culprit Ostial-proximal LCx subtotal 99 to 100% occlusion with <  TIMI I flow Mild diffuse proximal LAD disease with 70% stenosis after short mid vessel diagonal branch, the large Ramus-like first diagonal with focal 50% stenosis Heavily calcified ostial RCA (very difficult to engage) 95% stenosis followed by diffuse 40 to 60%  ISR of the proximal to mid vessel stent followed by 99 % subtotal occlusion after the stent crossing major RV marginal branch. Brisk collaterals fill the PDA  Moderate reduced EF with basal to mid inferior essentially akinesis and inferolateral hypokinesis.  Transfer to [name] pain-free at [facility] Left circumflex felt to be subtotal, very small in caliber, diffuse moderate disease of mid LAD and large diagonal with heavy calcification RCA with critical ostial and mid vessel disease with stents between with moderate in-stent restenosis, entire RCA severely calcified CABG not felt to be a good candidate Eliquis, Plavix, isosorbide, Ranexa  Zio monitor from April 2024 Frequent episodes of tachycardia, frequent PACs 408 Supraventricular Tachycardia runs occurred, the run with the fastest interval lasting 5 beats with a max rate of 146 bpm, the longest lasting 25.4 secs with an avg rate of 110 bpm.  History of bradycardia  past medical history reviewed history of left parieto-occipital hemorrhagic stroke  seen by ophthamologist 3/22 who did HVF test which showed right inf>sup quandrantopsia. felt this was a new finding Mr. [name] denied any new visual field deficits.  He has been driving Seen in the Er 6/96/2952 Head MRI: nothing acute  01/16/2020,  ED for syncope 3 alcoholic beverages that night, which was more than his usual  woke around 2 AM, and after entering the kitchen, had a syncopal episode  --received his second COVID-19 vaccination 4 days prior. usually spends some time on the edge of the bed and seated before standing, in order to prevent dizziness  daughter reports the fall / LOC woke her from sleep She found him slumped over on the floor.  he was cold to the touch and unable to speak  blood pressure was low.  EMS took his BP when they arrived and noted it was low.  MRA was without acute abnormality and showed mild chronic small vessel ischemic dz and moderate cerebral  atrophy. chronic hemorrhagic L temporoparietal infarct.     fall while in [place] 2019 Was watching a football game, stood up and had a mechanical fall tripped over some shoes 10/24/2018 Suffered a hip fracture , was hospitalized  Following the surgery the physicians increased his lisinopril up to 20 mg daily and placed him on aspirin 325 mg daily He has started to have some upset stomach, wonders if it could be the high-dose aspirin  February 2018 he had acute vision changes, word finding difficulty. He was taken by life flight to [facility] hospital MRI/MRA showing acute stroke Details of scan as below: 1. Acute left parieto-occipital intraparenchymal hematoma with adjacent subarachnoid hemorrhage. This most likely represents hemorrhagic transformation from an embolic infarct given its configuration. No underlying mass. No other findings to suggest amyloid angiopathy. 2. Normal MRA of the head.  He had a CT scan of the neck showing 50% stenosis on the left, 10% on the right  PMH:   has a past medical history of Acute embolic stroke (HCC) (02/01/2017), BPH (benign prostatic hyperplasia), Bradycardia (11/10/2015), Coronary artery disease, Dizziness (08/21/2012), DYSPEPSIA (07/31/2010), Hyperlipidemia (05/16/2010), Hypertension, HYPERTENSION, BENIGN (05/16/2010), and Stenosis of right carotid artery (02/01/2017).  PSH:    Past Surgical History:  Procedure Laterality Date   ESOPHAGOGASTRODUODENOSCOPY (EGD) WITH PROPOFOL N/A 01/08/2023   Procedure:  ESOPHAGOGASTRODUODENOSCOPY (EGD) WITH PROPOFOL;  Surgeon: Midge Minium, MD;  Location: [facility] ENDOSCOPY;  Service: Endoscopy;  Laterality: N/A;   HIP SURGERY     right hip    Intestinal Blockage     LEFT HEART CATH AND CORONARY ANGIOGRAPHY N/A 06/15/2023   Procedure: LEFT HEART CATH AND CORONARY ANGIOGRAPHY;  Surgeon: Marykay Lex, MD;  Location: [facility] INVASIVE CV LAB;  Service: Cardiovascular;  Laterality: N/A;   LOOP RECORDER INSERTION N/A 05/14/2017    Procedure: Loop Recorder Insertion;  Surgeon: [facility] Salvia, MD;  Location: [facility] INVASIVE CV LAB;  Service: Cardiovascular;  Laterality: N/A;   SKIN CANCER EXCISION     x2   TONSILLECTOMY      Current Outpatient Medications  Medication Sig Dispense Refill   acetaminophen (TYLENOL) 650 MG CR tablet Take 650 mg by mouth every 8 (eight) hours as needed.      apixaban (ELIQUIS) 2.5 MG TABS tablet Take 1 tablet (2.5 mg total) by mouth 2 (two) times daily. 180 tablet 3   Ascorbic Acid (VITAMIN C) 1000 MG tablet Take 1,000 mg by mouth daily.     atorvastatin (LIPITOR) 20 MG tablet Take 20 mg by mouth at bedtime.     b complex vitamins tablet Take 1 tablet by mouth daily.     cholecalciferol (VITAMIN D) 1000 units tablet Take 2,000 Units by mouth daily.     clopidogrel (PLAVIX) 75 MG tablet Take 1 tablet (75 mg total) by mouth daily. 90 tablet 0   cyanocobalamin (VITAMIN B12) 1000 MCG tablet Take 1,000 mcg by mouth daily.     docusate sodium (COLACE) 100 MG capsule Take 100 mg by mouth 2 (two) times daily.     empagliflozin (JARDIANCE) 25 MG TABS tablet Take 12.5 mg by mouth.     ezetimibe (ZETIA) 10 MG tablet Take 1 tablet (10 mg total) by mouth daily. 90 tablet 3   FLAXSEED, LINSEED, PO Take 2 g by mouth daily.     furosemide (LASIX) 20 MG tablet Take 20 mg by mouth as needed.     metoprolol succinate (TOPROL-XL) 25 MG 24 hr tablet Take 12.5 mg by mouth daily.     nitroGLYCERIN (NITROSTAT) 0.4 MG SL tablet Place 1 tablet (0.4 mg total) under the tongue every 5 (five) minutes x 3 doses as needed for chest pain. 25 tablet 0   pantoprazole (PROTONIX) 40 MG tablet Take 1 tablet (40 mg total) by mouth 2 (two) times daily. 60 tablet 1   potassium chloride (KLOR-CON) 10 MEQ tablet Take 10 mEq by mouth as needed.     pregabalin (LYRICA) 150 MG capsule Take 150 mg by mouth at bedtime.     Probiotic Product (UP4 PROBIOTICS PO) Take 7.5 mg by mouth.     Psyllium (GERI-MUCIL) 25 % POWD Take by mouth.      ranolazine (RANEXA) 500 MG 12 hr tablet Take 1 tablet (500 mg total) by mouth 2 (two) times daily. 180 tablet 3   tamsulosin (FLOMAX) 0.4 MG CAPS capsule Take 2 capsules (0.8 mg total) by mouth daily. 30 capsule 0   zinc sulfate 220 (50 Zn) MG capsule Take 1 tablet by mouth daily.     pregabalin (LYRICA) 100 MG capsule Take 100 mg by mouth in the morning. (Patient not taking: Reported on 02/17/2024)     No current facility-administered medications for this visit.    Allergies:   Penicillins, Gabapentin, Penicillin g, and Codeine   Social History:  The patient  reports that he has never smoked. He has never used smokeless tobacco. He reports current alcohol use. He reports that he does not use drugs.   Family History:   family history includes Cancer in an other family member; Hyperlipidemia in an other family member; Hypertension in his mother and another family member; Other in an other family member.    Review of Systems: Review of Systems  Constitutional: Negative.   HENT: Negative.    Respiratory: Negative.    Cardiovascular: Negative.   Gastrointestinal: Negative.   Musculoskeletal:  Positive for joint pain.  Neurological:  Positive for tremors.  Psychiatric/Behavioral:  Positive for memory loss.   All other systems reviewed and are negative.   PHYSICAL EXAM: VS:  BP 110/68 (BP Location: Left Arm, Patient Position: Sitting, Cuff Size: Normal)   Pulse 71   Ht 5\' 8"  (1.727 m)   Wt 175 lb (79.4 kg)   SpO2 92%   BMI 26.61 kg/m  , BMI Body mass index is 26.61 kg/m. Constitutional:  oriented to person, place, and time. No distress.  HENT:  Head: Grossly normal Eyes:  no discharge. No scleral icterus.  Neck: No JVD, no carotid bruits  Cardiovascular: Regular rate and rhythm, no murmurs appreciated Pulmonary/Chest: Clear to auscultation bilaterally, no wheezes or rails Abdominal: Soft.  no distension.  no tenderness.  Musculoskeletal: Normal range of motion Neurological:   normal muscle tone. Coordination normal. No atrophy Skin: Skin warm and dry Psychiatric: normal affect, pleasant  Recent Labs: 05/11/2023: Magnesium 2.3 06/26/2023: B Natriuretic Peptide 460.1 08/25/2023: ALT 17; BUN 22; Creatinine, Ser 1.44; Hemoglobin 13.2; Platelets 170; Potassium 4.0; Sodium 132    Lipid Panel Lab Results  Component Value Date   CHOL 90 06/16/2023   HDL 40 (L) 06/16/2023   LDLCALC 44 06/16/2023   TRIG 30 06/16/2023    Wt Readings from Last 3 Encounters:  02/17/24 175 lb (79.4 kg)  01/10/24 174 lb 1.6 oz (79 kg)  12/18/23 174 lb 1.6 oz (79 kg)     ASSESSMENT AND PLAN:  Atrial fibrillation, Maintaining normal sinus rhythm Continue Eliquis 2.5 twice daily Hello you previously expressed his concern of being on 5 twice daily On Ranexa, metoprolol succinate 12.5 daily  Bradycardia Stable on low-dose metoprolol succinate 12.5 daily Event monitor with no significant pauses  Leg pain Etiology unclear, likely musculoskeletal Normal ABIs and lower extremity arterial Dopplers August 2024 If symptoms persist may need to experiment with Lipitor hold  Aortic valve stenosis Severe stenosis low gradient, status post TAVR Has follow-up echo at [facility] November 2025  Hyperlipidemia, unspecified hyperlipidemia type - Continue Lipitor unless statin myalgias, leg pain in which case may need to hold his Lipitor  HYPERTENSION, BENIGN - Plan: EKG 12-Lead Blood pressure is well controlled on today's visit. No changes made to the medications.  Atherosclerosis of native coronary artery of native heart with chronic stable angina pectoris - Severe three-vessel coronary disease not amenable to intervention Was [facility] by interventional [facility] Tolerating Ranexa, metoprolol  Imdur held by team at [facility] Denies chest pain concerning for angina  syncope, unspecified syncope type - Plan: EKG 12-Lead Remote syncope No recent orthostasis symptoms  Acute embolic stroke  [facility]) Previously had loop monitor for 3 years with no significant arrhythmia Atrial fibrillation noted on prior office visit again 3% burden noted on event monitor through [facility] On Eliquis reduced dose though does not meet criteria for renal or weight  Stenosis of right carotid artery Nonobstructive disease,  Continue tour if tolerated  Essential tremor Followed by neurology  Cardiomyopathy Ejection fraction 35 to 40% after recent non-STEMI Metoprolol succinate, Jardiance Losartan 12.5 held for hypotension     Orders Placed This Encounter  Procedures   EKG 12-Lead     Signed, [name] Arbour, M.D., Ph.D. 02/17/2024  [facility] [place], [place] [phone]

## 2024-02-17 ENCOUNTER — Ambulatory Visit: Payer: Medicare Other | Attending: Cardiovascular Disease | Admitting: Cardiovascular Disease

## 2024-02-17 ENCOUNTER — Encounter: Payer: Medicare Other | Admitting: *Deleted

## 2024-02-17 VITALS — BP 110/68 | HR 71 | Ht 68.0 in | Wt 175.0 lb

## 2024-02-17 DIAGNOSIS — I480 Paroxysmal atrial fibrillation: Secondary | ICD-10-CM | POA: Diagnosis not present

## 2024-02-17 DIAGNOSIS — I350 Nonrheumatic aortic (valve) stenosis: Secondary | ICD-10-CM | POA: Insufficient documentation

## 2024-02-17 DIAGNOSIS — I2510 Atherosclerotic heart disease of native coronary artery without angina pectoris: Secondary | ICD-10-CM | POA: Diagnosis not present

## 2024-02-17 DIAGNOSIS — Z952 Presence of prosthetic heart valve: Secondary | ICD-10-CM

## 2024-02-17 DIAGNOSIS — Z8673 Personal history of transient ischemic attack (TIA), and cerebral infarction without residual deficits: Secondary | ICD-10-CM | POA: Diagnosis present

## 2024-02-17 DIAGNOSIS — I25110 Atherosclerotic heart disease of native coronary artery with unstable angina pectoris: Secondary | ICD-10-CM | POA: Insufficient documentation

## 2024-02-17 DIAGNOSIS — I471 Supraventricular tachycardia, unspecified: Secondary | ICD-10-CM | POA: Diagnosis present

## 2024-02-17 DIAGNOSIS — E785 Hyperlipidemia, unspecified: Secondary | ICD-10-CM | POA: Insufficient documentation

## 2024-02-17 DIAGNOSIS — I10 Essential (primary) hypertension: Secondary | ICD-10-CM | POA: Insufficient documentation

## 2024-02-17 DIAGNOSIS — I495 Sick sinus syndrome: Secondary | ICD-10-CM | POA: Insufficient documentation

## 2024-02-17 DIAGNOSIS — Z953 Presence of xenogenic heart valve: Secondary | ICD-10-CM | POA: Diagnosis present

## 2024-02-17 DIAGNOSIS — I5020 Unspecified systolic (congestive) heart failure: Secondary | ICD-10-CM | POA: Diagnosis not present

## 2024-02-17 NOTE — Patient Instructions (Addendum)
Medication Instructions:  No changes  If you need a refill on your cardiac medications before your next appointment, please call your pharmacy.    Lab work: No new labs needed   Testing/Procedures: No new testing needed   Follow-Up: At [facility], you and your health needs are our priority.  As part of our continuing mission to provide you with exceptional heart care, we have created designated Provider Care Teams.  These Care Teams include your primary Cardiologist (physician) and Advanced Practice Providers (APPs -  Physician Assistants and Nurse Practitioners) who all work together to provide you with the care you need, when you need it.  You will need a follow up appointment in 3 months  Providers on your designated Care Team:   Christopher Berge, NP Ryan Dunn, PA-C Cadence Furth, PA-C  COVID-19 Vaccine Information can be found at: [email or web address] For questions related to vaccine distribution or appointments, please email [email or web address] or call [phone].   

## 2024-02-17 NOTE — Progress Notes (Signed)
 Daily Session Note  Patient Details  Name: [name]. MRN: [id number] Date of Birth: Apr 15, 1933 Referring Provider:   Flowsheet Row Cardiac Rehab from 12/18/2023 in [facility] Cardiac and Pulmonary Rehab  Referring Provider Aram Beecham, MD       Encounter Date: 02/17/2024  Check In:  Session Check In - 02/17/24 1130       Check-In   Supervising physician immediately available to respond to emergencies See telemetry face sheet for immediately available ER MD    Location [facility]-Cardiac & Pulmonary Rehab    Staff Present Rory Percy, MS, Exercise Physiologist;Kelly Cloretta Ned, ACSM CEP, Exercise Physiologist;Meredith Jewel Baize RN,BSN;Torres Hardenbrook, RN, BSN, CCRP    Virtual Visit No    Medication changes reported     No    Fall or balance concerns reported    No    Warm-up and Cool-down Performed on first and last piece of equipment    Resistance Training Performed Yes    VAD Patient? No    PAD/SET Patient? No      Pain Assessment   Currently in Pain? No/denies                Social History   Tobacco Use  Smoking Status Never  Smokeless Tobacco Never    Goals Met:  Independence with exercise equipment Exercise tolerated well Personal goals reviewed No report of concerns or symptoms today  Goals Unmet:  Not Applicable  Comments: Pt able to follow exercise prescription today without complaint.  Will continue to monitor for progression.   Reviewed home exercise with pt today.  Pt plans to walk at home for exercise.  Reviewed THR, pulse, RPE, sign and symptoms, pulse oximetery and when to call 911 or MD.  Also discussed weather considerations and indoor options.  Pt voiced understanding.    Dr. Bethann Punches is Medical Director for [facility] Cardiac Rehabilitation.  Dr. Vida Rigger is Medical Director for [facility] Pulmonary Rehabilitation.

## 2024-02-19 ENCOUNTER — Encounter: Payer: Medicare Other | Admitting: *Deleted

## 2024-02-19 DIAGNOSIS — Z952 Presence of prosthetic heart valve: Secondary | ICD-10-CM

## 2024-02-19 NOTE — Progress Notes (Signed)
 Daily Session Note  Patient Details  Name: [name]. MRN: [id number] Date of Birth: 1933/03/07 Referring Provider:   Flowsheet Row Cardiac Rehab from 12/18/2023 in [facility] Cardiac and Pulmonary Rehab  Referring Provider Aram Beecham, MD       Encounter Date: 02/19/2024  Check In:  Session Check In - 02/19/24 1154       Check-In   Supervising physician immediately available to respond to emergencies See telemetry face sheet for immediately available ER MD    Location [facility]-Cardiac & Pulmonary Rehab    Staff Present Maxon Conetta BS, Exercise Physiologist;Noah Tickle, BS, Exercise Physiologist;Olivine Hiers, RN, BSN, CCRP;Joseph Hood RCP,RRT,BSRT    Virtual Visit No    Medication changes reported     No    Fall or balance concerns reported    No    Warm-up and Cool-down Performed on first and last piece of equipment    Resistance Training Performed Yes    VAD Patient? No    PAD/SET Patient? No      Pain Assessment   Currently in Pain? No/denies                Social History   Tobacco Use  Smoking Status Never  Smokeless Tobacco Never    Goals Met:  Independence with exercise equipment Exercise tolerated well No report of concerns or symptoms today  Goals Unmet:  Not Applicable  Comments: Pt able to follow exercise prescription today without complaint.  Will continue to monitor for progression.    Dr. Bethann Punches is Medical Director for [facility] Cardiac Rehabilitation.  Dr. Vida Rigger is Medical Director for [facility] Pulmonary Rehabilitation.

## 2024-02-24 ENCOUNTER — Encounter: Payer: Medicare Other | Admitting: *Deleted

## 2024-02-24 DIAGNOSIS — Z952 Presence of prosthetic heart valve: Secondary | ICD-10-CM | POA: Diagnosis not present

## 2024-02-24 NOTE — Progress Notes (Signed)
 Daily Session Note  Patient Details  Name: [name]. MRN: [id number] Date of Birth: [birth year] Referring Provider:   Flowsheet Row Cardiac Rehab from 12/18/2023 in [facility] Cardiac and Pulmonary Rehab  Referring Provider Aram Beecham, MD       Encounter Date: 02/24/2024  Check In:  Session Check In - 02/24/24 1616       Check-In   Supervising physician immediately available to respond to emergencies See telemetry face sheet for immediately available ER MD    Location [facility]-Cardiac & Pulmonary Rehab    Staff Present Cora Collum, RN, BSN, CCRP;Kelly Hayes BS, ACSM CEP, Exercise Physiologist;Maxon [employer], Exercise Physiologist;Margaret Best, MS, Exercise Physiologist    Virtual Visit No    Medication changes reported     No    Fall or balance concerns reported    No    Warm-up and Cool-down Performed on first and last piece of equipment    Resistance Training Performed Yes    VAD Patient? No    PAD/SET Patient? No      Pain Assessment   Currently in Pain? No/denies                Social History   Tobacco Use  Smoking Status Never  Smokeless Tobacco Never    Goals Met:  Independence with exercise equipment Exercise tolerated well No report of concerns or symptoms today  Goals Unmet:  Not Applicable  Comments: Pt able to follow exercise prescription today without complaint.  Will continue to monitor for progression.    Dr. Bethann Punches is Medical Director for [facility] Cardiac Rehabilitation.  Dr. Vida Rigger is Medical Director for [facility] Pulmonary Rehabilitation.

## 2024-02-26 ENCOUNTER — Encounter: Payer: Medicare Other | Admitting: *Deleted

## 2024-02-26 DIAGNOSIS — Z952 Presence of prosthetic heart valve: Secondary | ICD-10-CM

## 2024-02-26 NOTE — Progress Notes (Signed)
 Daily Session Note  Patient Details  Name: [name]. MRN: [id number] Date of Birth: [birth year] Referring Provider:   Flowsheet Row Cardiac Rehab from 12/18/2023 in [facility] Cardiac and Pulmonary Rehab  Referring Provider Aram Beecham, MD       Encounter Date: 02/26/2024  Check In:  Session Check In - 02/26/24 1342       Check-In   Supervising physician immediately available to respond to emergencies See telemetry face sheet for immediately available ER MD    Location [facility]-Cardiac & Pulmonary Rehab    Staff Present Susann Givens RN,BSN;Kelly Madilyn Fireman BS, ACSM CEP, Exercise Physiologist;Noah Tickle, BS, Exercise Physiologist;Jason Wallace Cullens RDN,LDN    Virtual Visit No    Medication changes reported     No    Fall or balance concerns reported    No    Warm-up and Cool-down Performed on first and last piece of equipment    Resistance Training Performed Yes    VAD Patient? No    PAD/SET Patient? No      Pain Assessment   Currently in Pain? No/denies                Social History   Tobacco Use  Smoking Status Never  Smokeless Tobacco Never    Goals Met:  Independence with exercise equipment Exercise tolerated well No report of concerns or symptoms today Strength training completed today  Goals Unmet:  Not Applicable  Comments: Pt able to follow exercise prescription today without complaint.  Will continue to monitor for progression.    Dr. Bethann Punches is Medical Director for [facility] Cardiac Rehabilitation.  Dr. Vida Rigger is Medical Director for [facility] Pulmonary Rehabilitation.

## 2024-03-02 ENCOUNTER — Encounter: Payer: Medicare Other | Admitting: *Deleted

## 2024-03-02 DIAGNOSIS — Z952 Presence of prosthetic heart valve: Secondary | ICD-10-CM | POA: Diagnosis not present

## 2024-03-02 NOTE — Progress Notes (Signed)
 Daily Session Note  Patient Details  Name: [name]. MRN: [id number] Date of Birth: 14-Feb-1933 Referring Provider:   Flowsheet Row Cardiac Rehab from 12/18/2023 in [facility] Cardiac and Pulmonary Rehab  Referring Provider Aram Beecham, MD       Encounter Date: 03/02/2024  Check In:  Session Check In - 03/02/24 1136       Check-In   Supervising physician immediately available to respond to emergencies See telemetry face sheet for immediately available ER MD    Location [facility]-Cardiac & Pulmonary Rehab    Staff Present Rory Percy, MS, Exercise Physiologist;Maxon Suzzette Righter, Exercise Physiologist;Kelly Cloretta Ned, ACSM CEP, Exercise Physiologist;Gibbs Naugle, RN, BSN, CCRP    Virtual Visit No    Medication changes reported     No    Fall or balance concerns reported    No    Resistance Training Performed Yes    VAD Patient? No    PAD/SET Patient? No      Pain Assessment   Currently in Pain? No/denies                Social History   Tobacco Use  Smoking Status Never  Smokeless Tobacco Never    Goals Met:  Independence with exercise equipment Exercise tolerated well No report of concerns or symptoms today  Goals Unmet:  Not Applicable  Comments: Pt able to follow exercise prescription today without complaint.  Will continue to monitor for progression.    Dr. Bethann Punches is Medical Director for [facility] Cardiac Rehabilitation.  Dr. Vida Rigger is Medical Director for [facility] Pulmonary Rehabilitation.

## 2024-03-04 ENCOUNTER — Encounter: Payer: Medicare Other | Attending: Cardiology | Admitting: *Deleted

## 2024-03-04 DIAGNOSIS — Z952 Presence of prosthetic heart valve: Secondary | ICD-10-CM | POA: Insufficient documentation

## 2024-03-04 NOTE — Progress Notes (Signed)
 Daily Session Note  Patient Details  Name: [name]. MRN: [id number] Date of Birth: [birth year] Referring Provider:   Flowsheet Row Cardiac Rehab from 12/18/2023 in [facility]  Referring Provider Aram Beecham, MD       Encounter Date: 03/04/2024  Check In:  Session Check In - 03/04/24 1346       Check-In   Supervising physician immediately available to respond to emergencies See telemetry face sheet for immediately available ER MD    Location [facility]-Cardiac & Pulmonary Rehab    Staff Present Susann Givens RN,BSN;Noah Tickle, BS, Exercise Physiologist;Kelly Cloretta Ned, ACSM CEP, Exercise Physiologist;Jason Wallace Cullens RDN,LDN    Virtual Visit No    Medication changes reported     No    Fall or balance concerns reported    No    Warm-up and Cool-down Performed on first and last piece of equipment    Resistance Training Performed Yes    VAD Patient? No    PAD/SET Patient? No      Pain Assessment   Currently in Pain? No/denies                Social History   Tobacco Use  Smoking Status Never  Smokeless Tobacco Never    Goals Met:  Independence with exercise equipment Exercise tolerated well No report of concerns or symptoms today Strength training completed today  Goals Unmet:  Not Applicable  Comments: Pt able to follow exercise prescription today without complaint.  Will continue to monitor for progression.    Dr. Bethann Punches is Medical Director for [facility] Cardiac Rehabilitation.  Dr. Vida Rigger is Medical Director for [facility] Rehabilitation.

## 2024-03-09 ENCOUNTER — Encounter: Payer: Medicare Other | Admitting: *Deleted

## 2024-03-09 DIAGNOSIS — Z952 Presence of prosthetic heart valve: Secondary | ICD-10-CM | POA: Diagnosis not present

## 2024-03-09 NOTE — Progress Notes (Signed)
 Daily Session Note  Patient Details  Name: [name]. MRN: [id number] Date of Birth: 11-Aug-1933 Referring Provider:   Flowsheet Row Cardiac Rehab from 12/18/2023 in [facility]  Referring Provider Aram Beecham, MD       Encounter Date: 03/09/2024  Check In:  Session Check In - 03/09/24 1131       Check-In   Supervising physician immediately available to respond to emergencies See telemetry face sheet for immediately available ER MD    Location [facility] Pul[facility]    Staff Present Cora Collum, RN, BSN, CCRP;Joseph Hood RCP,RRT,BSRT;Kelly [place] BS, ACSM CEP, Exercise Physiologist;[employer], Exercise Physiologist    Virtual Visit No    Medication changes reported     No    Fall or balance concerns reported    No    Warm-up and Cool-down Performed on first and last piece of equipment    Resistance Training Performed Yes    VAD Patient? No    PAD/SET Patient? No      Pain Assessment   Currently in Pain? No/denies                Social History   Tobacco Use  Smoking Status Never  Smokeless Tobacco Never    Goals Met:  Independence with exercise equipment Exercise tolerated well No report of concerns or symptoms today  Goals Unmet:  Not Applicable  Comments: Pt able to follow exercise prescription today without complaint.  Will continue to monitor for progression.    Dr. Bethann Punches is Medical Director for [facility] Card[facility].  Dr. Vida Rigger is Medical Director for [facility] Pulmonary Rehabilitation.

## 2024-03-11 ENCOUNTER — Encounter: Payer: Medicare Other | Admitting: *Deleted

## 2024-03-11 ENCOUNTER — Encounter: Payer: Self-pay | Admitting: *Deleted

## 2024-03-11 DIAGNOSIS — Z952 Presence of prosthetic heart valve: Secondary | ICD-10-CM

## 2024-03-11 NOTE — Progress Notes (Signed)
 Cardiac Individual Treatment Plan  Patient Details  Name: [name]. MRN: [id number] Date of Birth: 06/28/1933 Referring Provider:   Flowsheet Row Cardiac Rehab from 12/18/2023 in [facility] Cardiac and Pulmonary Rehab  Referring Provider Aram Beecham, MD       Initial Encounter Date:  Flowsheet Row Cardiac Rehab from 12/18/2023 in [facility] Cardiac and Pulmonary Rehab  Date 12/18/23       Visit Diagnosis: S/P TAVR (transcatheter aortic valve replacement)  Patient's Home Medications on Admission:  Current Outpatient Medications:    acetaminophen (TYLENOL) 650 MG CR tablet, Take 650 mg by mouth every 8 (eight) hours as needed. , Disp: , Rfl:    apixaban (ELIQUIS) 2.5 MG TABS tablet, Take 1 tablet (2.5 mg total) by mouth 2 (two) times daily., Disp: 180 tablet, Rfl: 3   Ascorbic Acid (VITAMIN C) 1000 MG tablet, Take 1,000 mg by mouth daily., Disp: , Rfl:    atorvastatin (LIPITOR) 20 MG tablet, Take 20 mg by mouth at bedtime., Disp: , Rfl:    b complex vitamins tablet, Take 1 tablet by mouth daily., Disp: , Rfl:    cholecalciferol (VITAMIN D) 1000 units tablet, Take 2,000 Units by mouth daily., Disp: , Rfl:    clopidogrel (PLAVIX) 75 MG tablet, Take 1 tablet (75 mg total) by mouth daily., Disp: 90 tablet, Rfl: 0   cyanocobalamin (VITAMIN B12) 1000 MCG tablet, Take 1,000 mcg by mouth daily., Disp: , Rfl:    docusate sodium (COLACE) 100 MG capsule, Take 100 mg by mouth 2 (two) times daily., Disp: , Rfl:    empagliflozin (JARDIANCE) 25 MG TABS tablet, Take 12.5 mg by mouth., Disp: , Rfl:    ezetimibe (ZETIA) 10 MG tablet, Take 1 tablet (10 mg total) by mouth daily., Disp: 90 tablet, Rfl: 3   FLAXSEED, LINSEED, PO, Take 2 g by mouth daily., Disp: , Rfl:    furosemide (LASIX) 20 MG tablet, Take 20 mg by mouth as needed., Disp: , Rfl:    metoprolol succinate (TOPROL-XL) 25 MG 24 hr tablet, Take 12.5 mg by mouth daily., Disp: , Rfl:    nitroGLYCERIN (NITROSTAT) 0.4 MG SL tablet, Place  1 tablet (0.4 mg total) under the tongue every 5 (five) minutes x 3 doses as needed for chest pain., Disp: 25 tablet, Rfl: 0   pantoprazole (PROTONIX) 40 MG tablet, Take 1 tablet (40 mg total) by mouth 2 (two) times daily., Disp: 60 tablet, Rfl: 1   potassium chloride (KLOR-CON) 10 MEQ tablet, Take 10 mEq by mouth as needed., Disp: , Rfl:    pregabalin (LYRICA) 100 MG capsule, Take 100 mg by mouth in the morning. (Patient not taking: Reported on 02/17/2024), Disp: , Rfl:    pregabalin (LYRICA) 150 MG capsule, Take 150 mg by mouth at bedtime., Disp: , Rfl:    Probiotic Product (UP4 PROBIOTICS PO), Take 7.5 mg by mouth., Disp: , Rfl:    Psyllium (GERI-MUCIL) 25 % POWD, Take by mouth., Disp: , Rfl:    ranolazine (RANEXA) 500 MG 12 hr tablet, Take 1 tablet (500 mg total) by mouth 2 (two) times daily., Disp: 180 tablet, Rfl: 3   tamsulosin (FLOMAX) 0.4 MG CAPS capsule, Take 2 capsules (0.8 mg total) by mouth daily., Disp: 30 capsule, Rfl: 0   zinc sulfate 220 (50 Zn) MG capsule, Take 1 tablet by mouth daily., Disp: , Rfl:   Past Medical History: Past Medical History:  Diagnosis Date   Acute embolic stroke (HCC) 02/01/2017   BPH (  benign prostatic hyperplasia)    Bradycardia 11/10/2015   Coronary artery disease    Dizziness 08/21/2012   DYSPEPSIA 07/31/2010   Qualifier: Diagnosis of  By: Janee Morn, RN, Ashley     Hyperlipidemia 05/16/2010   Qualifier: Diagnosis of  By: Mariah Milling MD, [name]      Hypertension    HYPERTENSION, BENIGN 05/16/2010   Qualifier: Diagnosis of  By: Mariah Milling MD, [name]     Stenosis of right carotid artery 02/01/2017    Tobacco Use: Social History   Tobacco Use  Smoking Status Never  Smokeless Tobacco Never    Labs: Review Flowsheet       Latest Ref Rng & Units 01/23/2010 06/15/2023 06/16/2023  Labs for ITP Cardiac and Pulmonary Rehab  Cholestrol 0 - 200 mg/dL 161  - 90   LDL (calc) 0 - 99 mg/dL 09.6  - 44   HDL-C >04 mg/dL 54.0  - 40   Trlycerides <150 mg/dL 93  - 30    Hemoglobin A1c 4.8 - 5.6 % 5.4  6.3  6.3  -    Details       Multiple values from one day are sorted in reverse-chronological order          Exercise Target Goals: Exercise Program Goal: Individual exercise prescription set using results from initial 6 min walk test and THRR while considering  patient's activity barriers and safety.   Exercise Prescription Goal: Initial exercise prescription builds to 30-45 minutes a day of aerobic activity, 2-3 days per week.  Home exercise guidelines will be given to patient during program as part of exercise prescription that the participant will acknowledge.   Education: Aerobic Exercise: - Group verbal and visual presentation on the components of exercise prescription. Introduces F.I.T.T principle from ACSM for exercise prescriptions.  Reviews F.I.T.T. principles of aerobic exercise including progression. Written material given at graduation. Flowsheet Row Cardiac Rehab from 12/18/2023 in [facility] Cardiac and Pulmonary Rehab  Education need identified 12/18/23       Education: Resistance Exercise: - Group verbal and visual presentation on the components of exercise prescription. Introduces F.I.T.T principle from ACSM for exercise prescriptions  Reviews F.I.T.T. principles of resistance exercise including progression. Written material given at graduation.    Education: Exercise & Equipment Safety: - Individual verbal instruction and demonstration of equipment use and safety with use of the equipment. Flowsheet Row Cardiac Rehab from 12/18/2023 in [facility] Cardiac and Pulmonary Rehab  Date 12/18/23  Educator MB  Instruction Review Code 1- Verbalizes Understanding       Education: Exercise Physiology & General Exercise Guidelines: - Group verbal and written instruction with models to review the exercise physiology of the cardiovascular system and associated critical values. Provides general exercise guidelines with specific guidelines to those  with heart or lung disease.  Flowsheet Row Cardiac Rehab from 12/18/2023 in [facility] Cardiac and Pulmonary Rehab  Education need identified 12/18/23       Education: Flexibility, Balance, Mind/Body Relaxation: - Group verbal and visual presentation with interactive activity on the components of exercise prescription. Introduces F.I.T.T principle from ACSM for exercise prescriptions. Reviews F.I.T.T. principles of flexibility and balance exercise training including progression. Also discusses the mind body connection.  Reviews various relaxation techniques to help reduce and manage stress (i.e. Deep breathing, progressive muscle relaxation, and visualization). Balance handout provided to take home. Written material given at graduation.   Activity Barriers & Risk Stratification:  Activity Barriers & Cardiac Risk Stratification - 12/18/23 1503  Activity Barriers & Cardiac Risk Stratification   Activity Barriers Arthritis;Back Problems;Balance Concerns;Muscular Weakness    Cardiac Risk Stratification High             6 Minute Walk:  6 Minute Walk     Row Name 12/18/23 1501         6 Minute Walk   Phase Initial     Distance 805 feet     Walk Time 6 minutes     # of Rest Breaks 0     MPH 1.52     METS 1.16     RPE 11     Perceived Dyspnea  0     VO2 Peak 4.06     Symptoms No     Resting HR 64 bpm     Resting BP 146/86     Resting Oxygen Saturation  97 %     Exercise Oxygen Saturation  during 6 min walk 96 %     Max Ex. HR 90 bpm     Max Ex. BP 130/60     2 Minute Post BP 120/64              Oxygen Initial Assessment:   Oxygen Re-Evaluation:   Oxygen Discharge (Final Oxygen Re-Evaluation):   Initial Exercise Prescription:  Initial Exercise Prescription - 12/18/23 1500       Date of Initial Exercise RX and Referring Provider   Date 12/18/23    Referring Provider Aram Beecham, MD      Oxygen   Maintain Oxygen Saturation 88% or higher       Recumbant Bike   Level 1    RPM 50    Watts 15    Minutes 15    METs 1.16      NuStep   Level 1    SPM 80    Minutes 15    METs 1.16      Biostep-RELP   Level 1    Minutes 15    METs 1.16      Track   Laps 21    Minutes 15    METs 1.16      Prescription Details   Frequency (times per week) 2    Duration Progress to 30 minutes of continuous aerobic without signs/symptoms of physical distress      Intensity   THRR 40-80% of Max Heartrate 90-118    Ratings of Perceived Exertion 11-13    Perceived Dyspnea 0-4      Progression   Progression Continue to progress workloads to maintain intensity without signs/symptoms of physical distress.      Resistance Training   Training Prescription Yes    Weight 2lb    Reps 10-15             Perform Capillary Blood Glucose checks as needed.  Exercise Prescription Changes:   Exercise Prescription Changes     Row Name 12/18/23 1500 01/07/24 1500 01/23/24 1600 02/04/24 1500 02/17/24 1100     Response to Exercise   Blood Pressure (Admit) 146/86 124/70 122/64 120/64 --   Blood Pressure (Exercise) 130/60 126/70 130/80 118/62 --   Blood Pressure (Exit) 120/64 124/70 110/72 118/60 --   Heart Rate (Admit) 64 bpm 66 bpm 69 bpm 75 bpm --   Heart Rate (Exercise) 90 bpm 101 bpm 100 bpm 95 bpm --   Heart Rate (Exit) 62 bpm 73 bpm 67 bpm 78 bpm --   Oxygen Saturation (Admit) 97 % -- -- -- --  Oxygen Saturation (Exercise) 96 % -- -- -- --   Oxygen Saturation (Exit) 96 % -- -- -- --   Rating of Perceived Exertion (Exercise) 11 13 13 14  --   Perceived Dyspnea (Exercise) 0 0 0 -- --   Symptoms none none none none --   Comments results First 2 weeks of exercise -- -- --   Duration Progress to 30 minutes of  aerobic without signs/symptoms of physical distress Progress to 30 minutes of  aerobic without signs/symptoms of physical distress Progress to 30 minutes of  aerobic without signs/symptoms of physical distress Continue with 30  min of aerobic exercise without signs/symptoms of physical distress. Continue with 30 min of aerobic exercise without signs/symptoms of physical distress.   Intensity THRR New THRR New THRR New THRR unchanged THRR unchanged     Progression   Progression Continue to progress workloads to maintain intensity without signs/symptoms of physical distress. Continue to progress workloads to maintain intensity without signs/symptoms of physical distress. Continue to progress workloads to maintain intensity without signs/symptoms of physical distress. Continue to progress workloads to maintain intensity without signs/symptoms of physical distress. Continue to progress workloads to maintain intensity without signs/symptoms of physical distress.   Average METs 1.16 1.96 1.96 1.8 1.8     Resistance Training   Training Prescription -- Yes Yes Yes Yes   Weight -- 2lb 2lb 2lb 2lb   Reps -- 10-15 10-15 10-15 10-15     Interval Training   Interval Training -- No No No No     NuStep   Level -- 2  T6 3 2 2    Minutes -- 15 15 15 15    METs -- 1.9 1.8 1.36 1.36     T5 Nustep   Level -- -- 2  T6 2  T6 2  T6   Minutes -- -- 15 15 15    METs -- -- 1.6 2 2      Biostep-RELP   Level -- 1 -- -- --   Minutes -- 15 -- -- --   METs -- 2 -- -- --     Track   Laps -- 21 21 21 21    Minutes -- 15 15 15 15    METs -- 2.14 2.14 2.14 2.14     Home Exercise Plan   Plans to continue exercise at -- -- -- -- Home (comment)  walking   Frequency -- -- -- -- Add 2 additional days to program exercise sessions.   Initial Home Exercises Provided -- -- -- -- 02/17/24     Oxygen   Maintain Oxygen Saturation -- 88% or higher 88% or higher 88% or higher 88% or higher    Row Name 02/17/24 1400 03/05/24 1600           Response to Exercise   Blood Pressure (Admit) 122/70 122/82      Blood Pressure (Exercise) 140/80 124/68      Blood Pressure (Exit) 122/68 114/66      Heart Rate (Admit) 77 bpm 67 bpm      Heart Rate  (Exercise) 98 bpm 95 bpm      Heart Rate (Exit) 74 bpm 74 bpm      Rating of Perceived Exertion (Exercise) 13 13      Symptoms none none      Duration Continue with 30 min of aerobic exercise without signs/symptoms of physical distress. Continue with 30 min of aerobic exercise without signs/symptoms of physical distress.  Intensity THRR unchanged THRR unchanged        Progression   Progression Continue to progress workloads to maintain intensity without signs/symptoms of physical distress. Continue to progress workloads to maintain intensity without signs/symptoms of physical distress.      Average METs 1.86 2.34        Resistance Training   Training Prescription Yes Yes      Weight 2lb 2lb      Reps 10-15 10-15        Interval Training   Interval Training No No        NuStep   Level -- 1      Minutes -- 15      METs -- 2.4        T5 Nustep   Level 2  T6 --      Minutes 15 --      METs 1.9 --        Track   Laps 20 35      Minutes 15 30      METs 2.09 2.9        Home Exercise Plan   Plans to continue exercise at Home (comment)  walking Home (comment)  walking      Frequency Add 2 additional days to program exercise sessions. Add 2 additional days to program exercise sessions.      Initial Home Exercises Provided 02/17/24 02/17/24        Oxygen   Maintain Oxygen Saturation 88% or higher 88% or higher               Exercise Comments:   Exercise Comments     Row Name 12/23/23 1128           Exercise Comments First full day of exercise!  Patient was oriented to gym and equipment including functions, settings, policies, and procedures.  Patient's individual exercise prescription and treatment plan were reviewed.  All starting workloads were established based on the results of the 6 minute walk test done at initial orientation visit.  The plan for exercise progression was also introduced and progression will be customized based on patient's performance and  goals.                Exercise Goals and Review:   Exercise Goals     Row Name 12/18/23 1507             Exercise Goals   Increase Physical Activity Yes       Intervention Provide advice, education, support and counseling about physical activity/exercise needs.;Develop an individualized exercise prescription for aerobic and resistive training based on initial evaluation findings, risk stratification, comorbidities and participant's personal goals.       Expected Outcomes Short Term: Attend rehab on a regular basis to increase amount of physical activity.;Long Term: Add in home exercise to make exercise part of routine and to increase amount of physical activity.;Long Term: Exercising regularly at least 3-5 days a week.       Increase Strength and Stamina Yes       Intervention Provide advice, education, support and counseling about physical activity/exercise needs.;Develop an individualized exercise prescription for aerobic and resistive training based on initial evaluation findings, risk stratification, comorbidities and participant's personal goals.       Expected Outcomes Short Term: Increase workloads from initial exercise prescription for resistance, speed, and METs.;Short Term: Perform resistance training exercises routinely during rehab and add in resistance training at home;Long Term: Improve cardiorespiratory fitness,  muscular endurance and strength as measured by increased METs and functional capacity ( )       Able to understand and use rate of perceived exertion (RPE) scale Yes       Intervention Provide education and explanation on how to use RPE scale       Expected Outcomes Short Term: Able to use RPE daily in rehab to express subjective intensity level;Long Term:  Able to use RPE to guide intensity level when exercising independently       Able to understand and use Dyspnea scale Yes       Intervention Provide education and explanation on how to use Dyspnea scale        Expected Outcomes Short Term: Able to use Dyspnea scale daily in rehab to express subjective sense of shortness of breath during exertion;Long Term: Able to use Dyspnea scale to guide intensity level when exercising independently       Knowledge and understanding of Target Heart Rate Range (THRR) Yes       Intervention Provide education and explanation of THRR including how the numbers were predicted and where they are located for reference       Expected Outcomes Short Term: Able to state/look up THRR;Short Term: Able to use daily as guideline for intensity in rehab;Long Term: Able to use THRR to govern intensity when exercising independently       Able to check pulse independently Yes       Intervention Provide education and demonstration on how to check pulse in carotid and radial arteries.;Review the importance of being able to check your own pulse for safety during independent exercise       Expected Outcomes Short Term: Able to explain why pulse checking is important during independent exercise;Long Term: Able to check pulse independently and accurately       Understanding of Exercise Prescription Yes       Intervention Provide education, explanation, and written materials on patient's individual exercise prescription       Expected Outcomes Short Term: Able to explain program exercise prescription;Long Term: Able to explain home exercise prescription to exercise independently                Exercise Goals Re-Evaluation :  Exercise Goals Re-Evaluation     Row Name 12/23/23 1129 01/07/24 1528 01/23/24 1654 02/04/24 1526 02/17/24 1145     Exercise Goal Re-Evaluation   Exercise Goals Review Increase Physical Activity;Able to understand and use rate of perceived exertion (RPE) scale;Knowledge and understanding of Target Heart Rate Range (THRR);Understanding of Exercise Prescription;Increase Strength and Stamina;Able to check pulse independently Increase Physical Activity;Increase Strength  and Stamina;Understanding of Exercise Prescription Increase Physical Activity;Increase Strength and Stamina;Understanding of Exercise Prescription Increase Physical Activity;Increase Strength and Stamina;Understanding of Exercise Prescription Increase Physical Activity;Able to understand and use rate of perceived exertion (RPE) scale;Knowledge and understanding of Target Heart Rate Range (THRR);Understanding of Exercise Prescription;Increase Strength and Stamina;Able to check pulse independently   Comments Reviewed RPE and dyspnea scale, THR and program prescription with pt today.  Pt voiced understanding and was given a copy of goals to take home. [name] is off to a good start in the program. He was able to increase his level on the T6 nustep to level 2. He was also able to maintain his track laps, and level on the biostep. We will continue to monitor his progress in the program. [name] is doing well in rehab. He was recently able to increase his level on  the T4 nustep from 2 to 3. He was also able to maintain 21 laps on the track in 15 minutes. We will continue to monitor his progress in the program. [name] is doing well in rehab. He was able to maintain 21 laps on the track and level 2 on the T6 nustep. We will continue to monitor his progress in the program. Reviewed home exercise with pt today.  Pt plans to walk at home for exercise.  Reviewed THR, pulse, RPE, sign and symptoms, pulse oximetery and when to call 911 or MD.  Also discussed weather considerations and indoor options.  Pt voiced understanding.   Expected Outcomes Short: Use RPE daily to regulate intensity.  Long: Follow program prescription in THR. Short: Continue to increase level on the T6 nustep, and push for more laps on the track. Long: Continue exercise to improve strength and stamina. Short: Continue to increase level on the T6 nustep, and push for more laps on the track. Long: Continue exercise to improve strength and stamina. Short:  Continue to push for more laps on the track. Long: Continue exercise to improve strength and stamina. Short: walk at home 1-2 days a week on off days of rehab, following recommendations provided by staff in home exercise review. Long: maintain independent exercise routine upon graduation from cardiac rehab.    Row Name 02/17/24 1455 03/05/24 1609           Exercise Goal Re-Evaluation   Exercise Goals Review Increase Physical Activity;Understanding of Exercise Prescription;Increase Strength and Stamina Increase Physical Activity;Understanding of Exercise Prescription;Increase Strength and Stamina      Comments [name] is doing well in rehab. He walked 20 laps on the track and was able to maintain level 2 on the T6. We will continue to monitor his progress in the program. [name] continues to do well in rehab. He was able to maintain a level 1 on the T6 nustep. He was also able to increase from 20 to 35 laps on the track. We will continue to monitor his progress in the program.      Expected Outcomes Short: Push for more laps on the track and try level 3 on the T6 nustep. Long: Continue exercise to improve strength and stamina. Short: Continue to increase track laps, return to level 2 on the T6 nustep. Long: Continue exercise to improve strength and stamina.               Discharge Exercise Prescription (Final Exercise Prescription Changes):  Exercise Prescription Changes - 03/05/24 1600       Response to Exercise   Blood Pressure (Admit) 122/82    Blood Pressure (Exercise) 124/68    Blood Pressure (Exit) 114/66    Heart Rate (Admit) 67 bpm    Heart Rate (Exercise) 95 bpm    Heart Rate (Exit) 74 bpm    Rating of Perceived Exertion (Exercise) 13    Symptoms none    Duration Continue with 30 min of aerobic exercise without signs/symptoms of physical distress.    Intensity THRR unchanged      Progression   Progression Continue to progress workloads to maintain intensity without  signs/symptoms of physical distress.    Average METs 2.34      Resistance Training   Training Prescription Yes    Weight 2lb    Reps 10-15      Interval Training   Interval Training No      NuStep   Level 1    Minutes  15    METs 2.4      Track   Laps 35    Minutes 30    METs 2.9      Home Exercise Plan   Plans to continue exercise at Home (comment)   walking   Frequency Add 2 additional days to program exercise sessions.    Initial Home Exercises Provided 02/17/24      Oxygen   Maintain Oxygen Saturation 88% or higher             Nutrition:  Target Goals: Understanding of nutrition guidelines, daily intake of sodium 1500mg , cholesterol 200mg , calories 30% from fat and 7% or less from saturated fats, daily to have 5 or more servings of fruits and vegetables.  Education: All About Nutrition: -Group instruction provided by verbal, written material, interactive activities, discussions, models, and posters to present general guidelines for heart healthy nutrition including fat, fiber, MyPlate, the role of sodium in heart healthy nutrition, utilization of the nutrition label, and utilization of this knowledge for meal planning. Follow up email sent as well. Written material given at graduation. Flowsheet Row Cardiac Rehab from 12/18/2023 in [facility] Cardiac and Pulmonary Rehab  Education need identified 12/18/23       Biometrics:  Pre Biometrics - 12/18/23 1508       Pre Biometrics   Height 5' 8.4" (1.737 m)    Weight 174 lb 1.6 oz (79 kg)    Waist Circumference 42.5 inches    Hip Circumference 38.5 inches    Waist to Hip Ratio 1.1 %    BMI (Calculated) 26.17    Single Leg Stand 0.5 seconds              Nutrition Therapy Plan and Nutrition Goals:  Nutrition Therapy & Goals - 12/18/23 1509       Nutrition Therapy   RD appointment deferred Yes      Personal Nutrition Goals   Nutrition Goal RD appointment deferred      Intervention Plan   Intervention  Prescribe, educate and counsel regarding individualized specific dietary modifications aiming towards targeted core components such as weight, hypertension, lipid management, diabetes, heart failure and other comorbidities.;Nutrition handout(s) given to patient.    Expected Outcomes Short Term Goal: Understand basic principles of dietary content, such as calories, fat, sodium, cholesterol and nutrients.             Nutrition Assessments:  MEDIFICTS Score Key: >=70 Need to make dietary changes  40-70 Heart Healthy Diet <= 40 Therapeutic Level Cholesterol Diet  Flowsheet Row Cardiac Rehab from 12/18/2023 in [facility] Cardiac and Pulmonary Rehab  Picture Your Plate Total Score on Admission 67      Picture Your Plate Scores: <16 Unhealthy dietary pattern with much room for improvement. 41-50 Dietary pattern unlikely to meet recommendations for good health and room for improvement. 51-60 More healthful dietary pattern, with some room for improvement.  >60 Healthy dietary pattern, although there may be some specific behaviors that could be improved.    Nutrition Goals Re-Evaluation:   Nutrition Goals Discharge (Final Nutrition Goals Re-Evaluation):   Psychosocial: Target Goals: Acknowledge presence or absence of significant depression and/or stress, maximize coping skills, provide positive support system. Participant is able to verbalize types and ability to use techniques and skills needed for reducing stress and depression.   Education: Stress, Anxiety, and Depression - Group verbal and visual presentation to define topics covered.  Reviews how body is impacted by stress, anxiety, and depression.  Also discusses healthy ways to reduce stress and to treat/manage anxiety and depression.  Written material given at graduation. Flowsheet Row Cardiac Rehab from 07/15/2023 in [facility] Cardiac and Pulmonary Rehab  Education need identified 07/15/23       Education: Sleep Hygiene -Provides  group verbal and written instruction about how sleep can affect your health.  Define sleep hygiene, discuss sleep cycles and impact of sleep habits. Review good sleep hygiene tips.    Initial Review & Psychosocial Screening:  Initial Psych Review & Screening - 12/17/23 1305       Initial Review   Current issues with Current Stress Concerns    Source of Stress Concerns Unable to participate in former interests or hobbies;Unable to perform yard/household activities      Family Dynamics   Good Support System? Yes   wife and family     Barriers   Psychosocial barriers to participate in program There are no identifiable barriers or psychosocial needs.;The patient should benefit from training in stress management and relaxation.      Screening Interventions   Interventions Encouraged to exercise;Provide feedback about the scores to participant;To provide support and resources with identified psychosocial needs    Expected Outcomes Short Term goal: Utilizing psychosocial counselor, staff and physician to assist with identification of specific Stressors or current issues interfering with healing process. Setting desired goal for each stressor or current issue identified.;Long Term Goal: Stressors or current issues are controlled or eliminated.;Short Term goal: Identification and review with participant of any Quality of Life or Depression concerns found by scoring the questionnaire.;Long Term goal: The participant improves quality of Life and PHQ9 Scores as seen by post scores and/or verbalization of changes             Quality of Life Scores:   Quality of Life - 12/18/23 1509       Quality of Life Scores   Health/Function Pre 25 %    Socioeconomic Pre 27.92 %    Psych/Spiritual Pre 28 %    Family Pre 30 %    GLOBAL Pre 27.09 %            Scores of 19 and below usually indicate a poorer quality of life in these areas.  A difference of  2-3 points is a clinically meaningful  difference.  A difference of 2-3 points in the total score of the Quality of Life Index has been associated with significant improvement in overall quality of life, self-image, physical symptoms, and general health in studies assessing change in quality of life.  PHQ-9: Review Flowsheet       12/18/2023 07/15/2023  Depression screen PHQ 2/9  Decreased Interest 0 0  Down, Depressed, Hopeless 0 0  PHQ - 2 Score 0 0  Altered sleeping 0 0  Tired, decreased energy 1 2  Change in appetite 0 1  Feeling bad or failure about yourself  0 0  Trouble concentrating 2 3  Moving slowly or fidgety/restless 3 0  Suicidal thoughts 0 0  PHQ-9 Score 6 6  Difficult doing work/chores Extremely dIfficult Somewhat difficult   Interpretation of Total Score  Total Score Depression Severity:  1-4 = Minimal depression, 5-9 = Mild depression, 10-14 = Moderate depression, 15-19 = Moderately severe depression, 20-27 = Severe depression   Psychosocial Evaluation and Intervention:  Psychosocial Evaluation - 12/17/23 1314       Psychosocial Evaluation & Interventions   Comments 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  He had a stroke in the past that caused difficulty in word processing, so his wife mainly spoke on the phone during the orientation phone call. 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 He has been walking more and has more energy. When asked about stress, it was mentioned that he really wants to get back to what he wants to do. His wife helps him when needed and notes that he really enjoyed cardiac rehab in the past so they both think this will help him get closer to where he wants to be, He has a good support system. He reports no sleep concerns or concerns with anxiety or depression.    Expected Outcomes Short: attend cardiac rehab for education and exercise. Long: devleop and maintain positive self care habits    Continue  Psychosocial Services  Follow up required by staff             Psychosocial Re-Evaluation:   Psychosocial Discharge (Final Psychosocial Re-Evaluation):   Vocational Rehabilitation: Provide vocational rehab assistance to qualifying candidates.   Vocational Rehab Evaluation & Intervention:   Education: Education Goals: Education classes will be provided on a variety of topics geared toward better understanding of heart health and risk factor modification. Participant will state understanding/return demonstration of topics presented as noted by education test scores.  Learning Barriers/Preferences:   General Cardiac Education Topics:  AED/CPR: - Group verbal and written instruction with the use of models to demonstrate the basic use of the AED with the basic ABC's of resuscitation.   Anatomy and Cardiac Procedures: - Group verbal and visual presentation and models provide information about basic cardiac anatomy and function. Reviews the testing methods done to diagnose heart disease and the outcomes of the test results. Describes the treatment choices: Medical Management, Angioplasty, or Coronary Bypass Surgery for treating various heart conditions including Myocardial Infarction, Angina, Valve Disease, and Cardiac Arrhythmias.  Written material given at graduation. Flowsheet Row Cardiac Rehab from 12/18/2023 in [facility] Cardiac and Pulmonary Rehab  Education need identified 12/18/23       Medication Safety: - Group verbal and visual instruction to review commonly prescribed medications for heart and lung disease. Reviews the medication, class of the drug, and side effects. Includes the steps to properly store meds and maintain the prescription regimen.  Written material given at graduation.   Intimacy: - Group verbal instruction through game format to discuss how heart and lung disease can affect sexual intimacy. Written material given at graduation.. Flowsheet Row Cardiac  Rehab from 12/18/2023 in [facility] Cardiac and Pulmonary Rehab  Education need identified 12/18/23       Know Your Numbers and Heart Failure: - Group verbal and visual instruction to discuss disease risk factors for cardiac and pulmonary disease and treatment options.  Reviews associated critical values for Overweight/Obesity, Hypertension, Cholesterol, and Diabetes.  Discusses basics of heart failure: signs/symptoms and treatments.  Introduces Heart Failure Zone chart for action plan for heart failure.  Written material given at graduation.   Infection Prevention: - Provides verbal and written material to individual with discussion of infection control including proper hand washing and proper equipment cleaning during exercise session. Flowsheet Row Cardiac Rehab from 12/18/2023 in [facility] Cardiac and Pulmonary Rehab  Date 12/18/23  Educator MB  Instruction Review Code 1- Verbalizes Understanding       Falls Prevention: - Provides verbal and written material to individual with discussion of falls prevention and  safety. Flowsheet Row Cardiac Rehab from 12/18/2023 in [facility] Cardiac and Pulmonary Rehab  Date 12/18/23  Educator MB  Instruction Review Code 1- Verbalizes Understanding       Other: -Provides group and verbal instruction on various topics (see comments)   Knowledge Questionnaire Score:  Knowledge Questionnaire Score - 12/18/23 1511       Knowledge Questionnaire Score   Pre Score 20/26             Core Components/Risk Factors/Patient Goals at Admission:  Personal Goals and Risk Factors at Admission - 12/18/23 1511       Core Components/Risk Factors/Patient Goals on Admission    Weight Management Yes;Weight Maintenance    Intervention Weight Management: Develop a combined nutrition and exercise program designed to reach desired caloric intake, while maintaining appropriate intake of nutrient and fiber, sodium and fats, and appropriate energy expenditure required for  the weight goal.;Weight Management: Provide education and appropriate resources to help participant work on and attain dietary goals.;Weight Management/Obesity: Establish reasonable short term and long term weight goals.    Admit Weight 174 lb 1.6 oz (79 kg)    Goal Weight: Short Term 174 lb (78.9 kg)    Goal Weight: Long Term 174 lb (78.9 kg)    Expected Outcomes Short Term: Continue to assess and modify interventions until short term weight is achieved;Weight Maintenance: Understanding of the daily nutrition guidelines, which includes 25-35% calories from fat, 7% or less cal from saturated fats, less than 200mg  cholesterol, less than 1.5gm of sodium, & 5 or more servings of fruits and vegetables daily;Understanding recommendations for meals to include 15-35% energy as protein, 25-35% energy from fat, 35-60% energy from carbohydrates, less than 200mg  of dietary cholesterol, 20-35 gm of total fiber daily;Understanding of distribution of calorie intake throughout the day with the consumption of 4-5 meals/snacks;Long Term: Adherence to nutrition and physical activity/exercise program aimed toward attainment of established weight goal    Heart Failure Yes    Intervention Provide a combined exercise and nutrition program that is supplemented with education, support and counseling about heart failure. Directed toward relieving symptoms such as shortness of breath, decreased exercise tolerance, and extremity edema.    Expected Outcomes Improve functional capacity of life;Short term: Attendance in program 2-3 days a week with increased exercise capacity. Reported lower sodium intake. Reported increased fruit and vegetable intake. Reports medication compliance.;Long term: Adoption of self-care skills and reduction of barriers for early signs and symptoms recognition and intervention leading to self-care maintenance.;Short term: Daily weights obtained and reported for increase. Utilizing diuretic protocols set by  physician.    Hypertension Yes    Intervention Provide education on lifestyle modifcations including regular physical activity/exercise, weight management, moderate sodium restriction and increased consumption of fresh fruit, vegetables, and low fat dairy, alcohol moderation, and smoking cessation.;Monitor prescription use compliance.    Expected Outcomes Short Term: Continued assessment and intervention until BP is < 140/84mm HG in hypertensive participants. < 130/69mm HG in hypertensive participants with diabetes, heart failure or chronic kidney disease.;Long Term: Maintenance of blood pressure at goal levels.    Lipids Yes    Intervention Provide education and support for participant on nutrition & aerobic/resistive exercise along with prescribed medications to achieve LDL 70mg , HDL >40mg .    Expected Outcomes Short Term: Participant states understanding of desired cholesterol values and is compliant with medications prescribed. Participant is following exercise prescription and nutrition guidelines.;Long Term: Cholesterol controlled with medications as prescribed, with individualized exercise RX and with  personalized nutrition plan. Value goals: LDL < 70mg , HDL > 40 mg.             Education:Diabetes - Individual verbal and written instruction to review signs/symptoms of diabetes, desired ranges of glucose level fasting, after meals and with exercise. Acknowledge that pre and post exercise glucose checks will be done for 3 sessions at entry of program.   Core Components/Risk Factors/Patient Goals Review:    Core Components/Risk Factors/Patient Goals at Discharge (Final Review):    ITP Comments:  ITP Comments     Row Name 12/17/23 1320 12/18/23 1501 12/23/23 1128 01/15/24 0727 02/12/24 0757   ITP Comments Initial phone call completed. Diagnosis can be found in [facility] 12/12/23. EP Orientation scheduled for Wednesday 1/15 at 1pm. Completed and gym orientation. Initial ITP created and  sent for review to Dr. Bethann Punches, Medical Director. First full day of exercise!  Patient was oriented to gym and equipment including functions, settings, policies, and procedures.  Patient's individual exercise prescription and treatment plan were reviewed.  All starting workloads were established based on the results of the 6 minute walk test done at initial orientation visit.  The plan for exercise progression was also introduced and progression will be customized based on patient's performance and goals. 30 Day review completed. Medical Director ITP review done, changes made as directed, and signed approval by Medical Director.    new to program 30 Day review completed. Medical Director ITP review done, changes made as directed, and signed approval by Medical Director.    Row Name 03/11/24 0732           ITP Comments 30 Day review completed. Medical Director ITP review done, changes made as directed, and signed approval by Medical Director.                Comments:

## 2024-03-11 NOTE — Progress Notes (Signed)
 Daily Session Note  Patient Details  Name: [name]. MRN: [id number] Date of Birth: March 28, 1933 Referring Provider:   Flowsheet Row Cardiac Rehab from 12/18/2023 in [facility] Cardiac and Pulmonary Rehab  Referring Provider Aram Beecham, MD       Encounter Date: 03/11/2024  Check In:  Session Check In - 03/11/24 1356       Check-In   Supervising physician immediately available to respond to emergencies See telemetry face sheet for immediately available ER MD    Location [facility]-Cardiac & Pulmonary Rehab    Staff Present Susann Givens RN,BSN;Susanne Bice, RN, BSN, CCRP;Noah Tickle, BS, Exercise Physiologist    Virtual Visit No    Medication changes reported     No    Fall or balance concerns reported    No    Warm-up and Cool-down Performed on first and last piece of equipment    Resistance Training Performed Yes    VAD Patient? No    PAD/SET Patient? No      Pain Assessment   Currently in Pain? No/denies                Social History   Tobacco Use  Smoking Status Never  Smokeless Tobacco Never    Goals Met:  Independence with exercise equipment Exercise tolerated well No report of concerns or symptoms today Strength training completed today  Goals Unmet:  Not Applicable  Comments: Pt able to follow exercise prescription today without complaint.  Will continue to monitor for progression.    Dr. Bethann Punches is Medical Director for [facility] Cardiac Rehabilitation.  Dr. Vida Rigger is Medical Director for [facility] Pulmonary Rehabilitation.

## 2024-03-18 ENCOUNTER — Encounter: Payer: Medicare Other | Admitting: *Deleted

## 2024-03-18 DIAGNOSIS — Z952 Presence of prosthetic heart valve: Secondary | ICD-10-CM | POA: Diagnosis not present

## 2024-03-18 NOTE — Progress Notes (Signed)
 Daily Session Note  Patient Details  Name: [name]. MRN: [id number] Date of Birth: February 01, 1933 Referring Provider:   Flowsheet Row Cardiac Rehab from 12/18/2023 in [facility] Cardiac and Pulmonary Rehab  Referring Provider Shary Deems, MD       Encounter Date: 03/18/2024  Check In:  Session Check In - 03/18/24 1138       Check-In   Supervising physician immediately available to respond to emergencies See telemetry face sheet for immediately available ER MD    Location [facility]-Cardiac & Pulmonary Rehab    Staff Present Lyell Samuel, MS, Exercise Physiologist;Maxon Conetta BS, Exercise Physiologist;Pieper Kasik, RN, BSN, CCRP;Meredith Craven RN,BSN    Virtual Visit No    Medication changes reported     No    Fall or balance concerns reported    No    Warm-up and Cool-down Performed on first and last piece of equipment    Resistance Training Performed Yes    VAD Patient? No    PAD/SET Patient? No      Pain Assessment   Currently in Pain? No/denies                Social History   Tobacco Use  Smoking Status Never  Smokeless Tobacco Never    Goals Met:  Independence with exercise equipment Exercise tolerated well No report of concerns or symptoms today  Goals Unmet:  Not Applicable  Comments: Pt able to follow exercise prescription today without complaint.  Will continue to monitor for progression.    Dr. Firman Hughes is Medical Director for [facility] Cardiac Rehabilitation.  Dr. Fuad Aleskerov is Medical Director for [facility] Pulmonary Rehabilitation.

## 2024-03-25 ENCOUNTER — Encounter: Payer: Medicare Other | Admitting: *Deleted

## 2024-03-25 DIAGNOSIS — Z952 Presence of prosthetic heart valve: Secondary | ICD-10-CM | POA: Diagnosis not present

## 2024-03-25 NOTE — Progress Notes (Signed)
 Daily Session Note  Patient Details  Name: [name]. MRN: [id number] Date of Birth: May 17, 1933 Referring Provider:   Flowsheet Row Cardiac Rehab from 12/18/2023 in [facility] Cardiac and Pulmonary Rehab  Referring Provider Shary Deems, MD       Encounter Date: 03/25/2024  Check In:  Session Check In - 03/25/24 1118       Check-In   Supervising physician immediately available to respond to emergencies See telemetry face sheet for immediately available ER MD    Location [facility]-Cardiac & Pulmonary Rehab    Staff Present Lyell Samuel, MS, Exercise Physiologist;Maxon Beauford Bounds, Exercise Physiologist;Joseph Hood RCP,RRT,BSRT;Meredith Craven RN,BSN;Graclyn Lawther, RN, BSN, CCRP    Virtual Visit No    Medication changes reported     No    Fall or balance concerns reported    No    Warm-up and Cool-down Performed on first and last piece of equipment    Resistance Training Performed Yes    VAD Patient? No    PAD/SET Patient? No      Pain Assessment   Currently in Pain? No/denies                Social History   Tobacco Use  Smoking Status Never  Smokeless Tobacco Never    Goals Met:  Independence with exercise equipment Exercise tolerated well No report of concerns or symptoms today  Goals Unmet:  Not Applicable  Comments: Pt able to follow exercise prescription today without complaint.  Will continue to monitor for progression.    Dr. Firman Hughes is Medical Director for [facility] Cardiac Rehabilitation.  Dr. Fuad Aleskerov is Medical Director for [facility] Pulmonary Rehabilitation.

## 2024-04-01 ENCOUNTER — Encounter: Payer: Medicare Other | Admitting: *Deleted

## 2024-04-01 DIAGNOSIS — Z952 Presence of prosthetic heart valve: Secondary | ICD-10-CM

## 2024-04-01 NOTE — Progress Notes (Signed)
 Daily Session Note  Patient Details  Name: [name]. MRN: [id number] Date of Birth: July 03, 1933 Referring Provider:   Flowsheet Row Cardiac Rehab from 12/18/2023 in [facility] Cardiac and Pulmonary Rehab  Referring Provider Shary Deems, MD       Encounter Date: 04/01/2024  Check In:  Session Check In - 04/01/24 1430       Check-In   Supervising physician immediately available to respond to emergencies See telemetry face sheet for immediately available ER MD    Location [facility]-Cardiac & Pulmonary Rehab    Staff Present Maxon Conetta BS, Exercise Physiologist;Noah Tickle, BS, Exercise Physiologist;Daryle Amis, RN, BSN, CCRP;Joseph Hood RCP,RRT,BSRT    Virtual Visit No    Medication changes reported     No    Fall or balance concerns reported    No    Warm-up and Cool-down Performed on first and last piece of equipment    Resistance Training Performed Yes    VAD Patient? No    PAD/SET Patient? No      Pain Assessment   Currently in Pain? No/denies                Social History   Tobacco Use  Smoking Status Never  Smokeless Tobacco Never    Goals Met:  Independence with exercise equipment Exercise tolerated well No report of concerns or symptoms today  Goals Unmet:  Not Applicable  Comments: Pt able to follow exercise prescription today without complaint.  Will continue to monitor for progression.    Dr. Firman Hughes is Medical Director for [facility] Cardiac Rehabilitation.  Dr. Fuad Aleskerov is Medical Director for [facility] Pulmonary Rehabilitation.

## 2024-04-02 ENCOUNTER — Ambulatory Visit (INDEPENDENT_AMBULATORY_CARE_PROVIDER_SITE_OTHER): Payer: Medicare Other | Admitting: Podiatry

## 2024-04-02 ENCOUNTER — Ambulatory Visit: Payer: Medicare Other | Admitting: Podiatry

## 2024-04-02 ENCOUNTER — Encounter: Payer: Self-pay | Admitting: Podiatry

## 2024-04-02 DIAGNOSIS — L84 Corns and callosities: Secondary | ICD-10-CM

## 2024-04-02 DIAGNOSIS — G629 Polyneuropathy, unspecified: Secondary | ICD-10-CM | POA: Diagnosis not present

## 2024-04-02 DIAGNOSIS — M79676 Pain in unspecified toe(s): Secondary | ICD-10-CM

## 2024-04-02 DIAGNOSIS — S91105S Unspecified open wound of left lesser toe(s) without damage to nail, sequela: Secondary | ICD-10-CM | POA: Insufficient documentation

## 2024-04-02 DIAGNOSIS — B351 Tinea unguium: Secondary | ICD-10-CM

## 2024-04-02 DIAGNOSIS — I255 Ischemic cardiomyopathy: Secondary | ICD-10-CM | POA: Insufficient documentation

## 2024-04-02 DIAGNOSIS — Z7729 Contact with and (suspected ) exposure to other hazardous substances: Secondary | ICD-10-CM | POA: Insufficient documentation

## 2024-04-02 DIAGNOSIS — I350 Nonrheumatic aortic (valve) stenosis: Secondary | ICD-10-CM | POA: Insufficient documentation

## 2024-04-02 DIAGNOSIS — I619 Nontraumatic intracerebral hemorrhage, unspecified: Secondary | ICD-10-CM | POA: Insufficient documentation

## 2024-04-06 ENCOUNTER — Encounter: Payer: Medicare Other | Attending: Cardiology | Admitting: *Deleted

## 2024-04-06 DIAGNOSIS — Z952 Presence of prosthetic heart valve: Secondary | ICD-10-CM

## 2024-04-06 NOTE — Progress Notes (Signed)
 Daily Session Note  Patient Details  Name: [name]. MRN: [id number] Date of Birth: Dec 22, 1932 Referring Provider:   Flowsheet Row Cardiac Rehab from 12/18/2023 in [facility] Cardiac and Pulmonary Rehab  Referring Provider Shary Deems, MD       Encounter Date: 04/06/2024  Check In:  Session Check In - 04/06/24 1115       Check-In   Supervising physician immediately available to respond to emergencies See telemetry face sheet for immediately available ER MD    Location [facility]-Cardiac & Pulmonary Rehab    Staff Present Maxon Beauford Bounds, Exercise Physiologist;Joseph Hood RCP,RRT,BSRT;Raun Routh Manson Seitz RN,BSN;Kelly [place] BS, ACSM CEP, Exercise Physiologist    Virtual Visit No    Medication changes reported     No    Fall or balance concerns reported    No    Warm-up and Cool-down Performed on first and last piece of equipment    Resistance Training Performed Yes    VAD Patient? No    PAD/SET Patient? No      Pain Assessment   Currently in Pain? No/denies                Social History   Tobacco Use  Smoking Status Never  Smokeless Tobacco Never    Goals Met:  Independence with exercise equipment Exercise tolerated well No report of concerns or symptoms today Strength training completed today  Goals Unmet:  Not Applicable  Comments: Pt able to follow exercise prescription today without complaint.  Will continue to monitor for progression.    Dr. Firman Hughes is Medical Director for [facility] Cardiac Rehabilitation.  Dr. Fuad Aleskerov is Medical Director for [facility] Pulmonary Rehabilitation.

## 2024-04-08 ENCOUNTER — Encounter: Payer: Self-pay | Admitting: *Deleted

## 2024-04-08 ENCOUNTER — Encounter: Payer: Medicare Other | Admitting: *Deleted

## 2024-04-08 DIAGNOSIS — Z952 Presence of prosthetic heart valve: Secondary | ICD-10-CM | POA: Diagnosis not present

## 2024-04-08 NOTE — Progress Notes (Signed)
 Daily Session Note  Patient Details  Name: [name]. MRN: [id number] Date of Birth: [birth year] Referring Provider:   Flowsheet Row Cardiac Rehab from 12/18/2023 in [facility] Cardiac and Pulmonary Rehab  Referring Provider Shary Deems, MD       Encounter Date: 04/08/2024  Check In:  Session Check In - 04/08/24 1107       Check-In   Supervising physician immediately available to respond to emergencies See telemetry face sheet for immediately available ER MD    Location [facility]-Cardiac & Pulmonary Rehab    Staff Present Maxon Conetta BS, Exercise Physiologist;Laureen Bevin Bucks, BS, RRT, CPFT;Joseph Hood RCP,RRT,BSRT;Meredith Craven RN,BSN;Marland Reine, RN, BSN, CCRP    Virtual Visit No    Medication changes reported     No    Fall or balance concerns reported    No    Warm-up and Cool-down Performed on first and last piece of equipment    Resistance Training Performed Yes    VAD Patient? No    PAD/SET Patient? No      Pain Assessment   Currently in Pain? No/denies                Social History   Tobacco Use  Smoking Status Never  Smokeless Tobacco Never    Goals Met:  Independence with exercise equipment Exercise tolerated well No report of concerns or symptoms today  Goals Unmet:  Not Applicable  Comments: Pt able to follow exercise prescription today without complaint.  Will continue to monitor for progression.    Dr. Firman Hughes is Medical Director for [facility] Cardiac Rehabilitation.  Dr. Fuad Aleskerov is Medical Director for [facility] Pulmonary Rehabilitation.

## 2024-04-08 NOTE — Progress Notes (Signed)
 Cardiac Individual Treatment Plan  Patient Details  Name: [name]. MRN: [id number] Date of Birth: 01-08-1933 Referring Provider:   Flowsheet Row Cardiac Rehab from 12/18/2023 in [facility] Cardiac and Pulmonary Rehab  Referring Provider Shary Deems, MD       Initial Encounter Date:  Flowsheet Row Cardiac Rehab from 12/18/2023 in [facility] Cardiac and Pulmonary Rehab  Date 12/18/23       Visit Diagnosis: S/P TAVR (transcatheter aortic valve replacement)  Patient's Home Medications on Admission:  Current Outpatient Medications:    acetaminophen  (TYLENOL ) 650 MG CR tablet, Take 650 mg by mouth every 8 (eight) hours as needed. , Disp: , Rfl:    apixaban  (ELIQUIS ) 2.5 MG TABS tablet, Take 1 tablet (2.5 mg total) by mouth 2 (two) times daily., Disp: 180 tablet, Rfl: 3   Ascorbic Acid (VITAMIN C ) 1000 MG tablet, Take 1,000 mg by mouth daily., Disp: , Rfl:    atorvastatin  (LIPITOR) 20 MG tablet, Take 20 mg by mouth at bedtime., Disp: , Rfl:    b complex vitamins tablet, Take 1 tablet by mouth daily., Disp: , Rfl:    cholecalciferol  (VITAMIN D ) 1000 units tablet, Take 2,000 Units by mouth daily., Disp: , Rfl:    clopidogrel  (PLAVIX ) 75 MG tablet, Take 1 tablet (75 mg total) by mouth daily., Disp: 90 tablet, Rfl: 0   cyanocobalamin  (VITAMIN B12) 1000 MCG tablet, Take 1,000 mcg by mouth daily., Disp: , Rfl:    docusate sodium  (COLACE) 100 MG capsule, Take 100 mg by mouth 2 (two) times daily., Disp: , Rfl:    empagliflozin (JARDIANCE) 25 MG TABS tablet, Take 12.5 mg by mouth., Disp: , Rfl:    ezetimibe  (ZETIA ) 10 MG tablet, Take 1 tablet (10 mg total) by mouth daily., Disp: 90 tablet, Rfl: 3   FLAXSEED, LINSEED, PO, Take 2 g by mouth daily., Disp: , Rfl:    furosemide  (LASIX ) 20 MG tablet, Take 20 mg by mouth as needed., Disp: , Rfl:    metoprolol  succinate (TOPROL -XL) 25 MG 24 hr tablet, Take 12.5 mg by mouth daily., Disp: , Rfl:    nitroGLYCERIN  (NITROSTAT ) 0.4 MG SL tablet, Place  1 tablet (0.4 mg total) under the tongue every 5 (five) minutes x 3 doses as needed for chest pain., Disp: 25 tablet, Rfl: 0   pantoprazole  (PROTONIX ) 40 MG tablet, Take 1 tablet (40 mg total) by mouth 2 (two) times daily., Disp: 60 tablet, Rfl: 1   potassium chloride  (KLOR-CON ) 10 MEQ tablet, Take 10 mEq by mouth as needed., Disp: , Rfl:    pregabalin  (LYRICA ) 100 MG capsule, Take 100 mg by mouth in the morning. (Patient not taking: Reported on 02/17/2024), Disp: , Rfl:    pregabalin  (LYRICA ) 150 MG capsule, Take 150 mg by mouth at bedtime., Disp: , Rfl:    Probiotic Product (UP4 PROBIOTICS PO), Take 7.5 mg by mouth., Disp: , Rfl:    Psyllium (GERI-MUCIL) 25 % POWD, Take by mouth., Disp: , Rfl:    ranolazine  (RANEXA ) 500 MG 12 hr tablet, Take 1 tablet (500 mg total) by mouth 2 (two) times daily., Disp: 180 tablet, Rfl: 3   tamsulosin  (FLOMAX ) 0.4 MG CAPS capsule, Take 2 capsules (0.8 mg total) by mouth daily., Disp: 30 capsule, Rfl: 0   zinc  sulfate 220 (50 Zn) MG capsule, Take 1 tablet by mouth daily., Disp: , Rfl:   Past Medical History: Past Medical History:  Diagnosis Date   Acute embolic stroke (HCC) 02/01/2017   BPH (  benign prostatic hyperplasia)    Bradycardia 11/10/2015   Coronary artery disease    Dizziness 08/21/2012   DYSPEPSIA 07/31/2010   Qualifier: Diagnosis of  By: Hildy Lowers, RN, Ashley     Hyperlipidemia 05/16/2010   Qualifier: Diagnosis of  By: Jerelene Monday MD, [name]      Hypertension    HYPERTENSION, BENIGN 05/16/2010   Qualifier: Diagnosis of  By: Jerelene Monday MD, [name]     Stenosis of right carotid artery 02/01/2017    Tobacco Use: Social History   Tobacco Use  Smoking Status Never  Smokeless Tobacco Never    Labs: Review Flowsheet       Latest Ref Rng & Units 01/23/2010 06/15/2023 06/16/2023  Labs for ITP Cardiac and Pulmonary Rehab  Cholestrol 0 - 200 mg/dL 161  - 90   LDL (calc) 0 - 99 mg/dL 09.6  - 44   HDL-C >04 mg/dL 54.0  - 40   Trlycerides <150 mg/dL 93  - 30    Hemoglobin A1c 4.8 - 5.6 % 5.4  6.3  6.3  -    Details       Multiple values from one day are sorted in reverse-chronological order          Exercise Target Goals: Exercise Program Goal: Individual exercise prescription set using results from initial 6 min walk test and THRR while considering  patient's activity barriers and safety.   Exercise Prescription Goal: Initial exercise prescription builds to 30-45 minutes a day of aerobic activity, 2-3 days per week.  Home exercise guidelines will be given to patient during program as part of exercise prescription that the participant will acknowledge.   Education: Aerobic Exercise: - Group verbal and visual presentation on the components of exercise prescription. Introduces F.I.T.T principle from ACSM for exercise prescriptions.  Reviews F.I.T.T. principles of aerobic exercise including progression. Written material given at graduation. Flowsheet Row Cardiac Rehab from 12/18/2023 in [facility] Cardiac and Pulmonary Rehab  Education need identified 12/18/23       Education: Resistance Exercise: - Group verbal and visual presentation on the components of exercise prescription. Introduces F.I.T.T principle from ACSM for exercise prescriptions  Reviews F.I.T.T. principles of resistance exercise including progression. Written material given at graduation.    Education: Exercise & Equipment Safety: - Individual verbal instruction and demonstration of equipment use and safety with use of the equipment. Flowsheet Row Cardiac Rehab from 12/18/2023 in [facility] Cardiac and Pulmonary Rehab  Date 12/18/23  Educator MB  Instruction Review Code 1- Verbalizes Understanding       Education: Exercise Physiology & General Exercise Guidelines: - Group verbal and written instruction with models to review the exercise physiology of the cardiovascular system and associated critical values. Provides general exercise guidelines with specific guidelines to those  with heart or lung disease.  Flowsheet Row Cardiac Rehab from 12/18/2023 in [facility] Cardiac and Pulmonary Rehab  Education need identified 12/18/23       Education: Flexibility, Balance, Mind/Body Relaxation: - Group verbal and visual presentation with interactive activity on the components of exercise prescription. Introduces F.I.T.T principle from ACSM for exercise prescriptions. Reviews F.I.T.T. principles of flexibility and balance exercise training including progression. Also discusses the mind body connection.  Reviews various relaxation techniques to help reduce and manage stress (i.e. Deep breathing, progressive muscle relaxation, and visualization). Balance handout provided to take home. Written material given at graduation.   Activity Barriers & Risk Stratification:  Activity Barriers & Cardiac Risk Stratification - 12/18/23 1503  Activity Barriers & Cardiac Risk Stratification   Activity Barriers Arthritis;Back Problems;Balance Concerns;Muscular Weakness    Cardiac Risk Stratification High             6 Minute Walk:  6 Minute Walk     Row Name 12/18/23 1501         6 Minute Walk   Phase Initial     Distance 805 feet     Walk Time 6 minutes     # of Rest Breaks 0     MPH 1.52     METS 1.16     RPE 11     Perceived Dyspnea  0     VO2 Peak 4.06     Symptoms No     Resting HR 64 bpm     Resting BP 146/86     Resting Oxygen Saturation  97 %     Exercise Oxygen Saturation  during 6 min walk 96 %     Max Ex. HR 90 bpm     Max Ex. BP 130/60     2 Minute Post BP 120/64              Oxygen Initial Assessment:   Oxygen Re-Evaluation:   Oxygen Discharge (Final Oxygen Re-Evaluation):   Initial Exercise Prescription:  Initial Exercise Prescription - 12/18/23 1500       Date of Initial Exercise RX and Referring Provider   Date 12/18/23    Referring Provider Shary Deems, MD      Oxygen   Maintain Oxygen Saturation 88% or higher       Recumbant Bike   Level 1    RPM 50    Watts 15    Minutes 15    METs 1.16      NuStep   Level 1    SPM 80    Minutes 15    METs 1.16      Biostep-RELP   Level 1    Minutes 15    METs 1.16      Track   Laps 21    Minutes 15    METs 1.16      Prescription Details   Frequency (times per week) 2    Duration Progress to 30 minutes of continuous aerobic without signs/symptoms of physical distress      Intensity   THRR 40-80% of Max Heartrate 90-118    Ratings of Perceived Exertion 11-13    Perceived Dyspnea 0-4      Progression   Progression Continue to progress workloads to maintain intensity without signs/symptoms of physical distress.      Resistance Training   Training Prescription Yes    Weight 2lb    Reps 10-15             Perform Capillary Blood Glucose checks as needed.  Exercise Prescription Changes:   Exercise Prescription Changes     Row Name 12/18/23 1500 01/07/24 1500 01/23/24 1600 02/04/24 1500 02/17/24 1100     Response to Exercise   Blood Pressure (Admit) 146/86 124/70 122/64 120/64 --   Blood Pressure (Exercise) 130/60 126/70 130/80 118/62 --   Blood Pressure (Exit) 120/64 124/70 110/72 118/60 --   Heart Rate (Admit) 64 bpm 66 bpm 69 bpm 75 bpm --   Heart Rate (Exercise) 90 bpm 101 bpm 100 bpm 95 bpm --   Heart Rate (Exit) 62 bpm 73 bpm 67 bpm 78 bpm --   Oxygen Saturation (Admit) 97 % -- -- -- --  Oxygen Saturation (Exercise) 96 % -- -- -- --   Oxygen Saturation (Exit) 96 % -- -- -- --   Rating of Perceived Exertion (Exercise) 11 13 13 14  --   Perceived Dyspnea (Exercise) 0 0 0 -- --   Symptoms none none none none --   Comments results First 2 weeks of exercise -- -- --   Duration Progress to 30 minutes of  aerobic without signs/symptoms of physical distress Progress to 30 minutes of  aerobic without signs/symptoms of physical distress Progress to 30 minutes of  aerobic without signs/symptoms of physical distress Continue with 30  min of aerobic exercise without signs/symptoms of physical distress. Continue with 30 min of aerobic exercise without signs/symptoms of physical distress.   Intensity THRR New THRR New THRR New THRR unchanged THRR unchanged     Progression   Progression Continue to progress workloads to maintain intensity without signs/symptoms of physical distress. Continue to progress workloads to maintain intensity without signs/symptoms of physical distress. Continue to progress workloads to maintain intensity without signs/symptoms of physical distress. Continue to progress workloads to maintain intensity without signs/symptoms of physical distress. Continue to progress workloads to maintain intensity without signs/symptoms of physical distress.   Average METs 1.16 1.96 1.96 1.8 1.8     Resistance Training   Training Prescription -- Yes Yes Yes Yes   Weight -- 2lb 2lb 2lb 2lb   Reps -- 10-15 10-15 10-15 10-15     Interval Training   Interval Training -- No No No No     NuStep   Level -- 2  T6 3 2 2    Minutes -- 15 15 15 15    METs -- 1.9 1.8 1.36 1.36     T5 Nustep   Level -- -- 2  T6 2  T6 2  T6   Minutes -- -- 15 15 15    METs -- -- 1.6 2 2      Biostep-RELP   Level -- 1 -- -- --   Minutes -- 15 -- -- --   METs -- 2 -- -- --     Track   Laps -- 21 21 21 21    Minutes -- 15 15 15 15    METs -- 2.14 2.14 2.14 2.14     Home Exercise Plan   Plans to continue exercise at -- -- -- -- Home (comment)  walking   Frequency -- -- -- -- Add 2 additional days to program exercise sessions.   Initial Home Exercises Provided -- -- -- -- 02/17/24     Oxygen   Maintain Oxygen Saturation -- 88% or higher 88% or higher 88% or higher 88% or higher    Row Name 02/17/24 1400 03/05/24 1600 03/20/24 0700 04/02/24 1100       Response to Exercise   Blood Pressure (Admit) 122/70 122/82 112/58 114/62    Blood Pressure (Exercise) 140/80 124/68 -- --    Blood Pressure (Exit) 122/68 114/66 126/60 142/64    Heart  Rate (Admit) 77 bpm 67 bpm 66 bpm 76 bpm    Heart Rate (Exercise) 98 bpm 95 bpm 103 bpm 103 bpm    Heart Rate (Exit) 74 bpm 74 bpm 74 bpm 87 bpm    Rating of Perceived Exertion (Exercise) 13 13 15 15     Symptoms none none none none    Duration Continue with 30 min of aerobic exercise without signs/symptoms of physical distress. Continue with 30 min of aerobic exercise without signs/symptoms of physical  distress. Continue with 30 min of aerobic exercise without signs/symptoms of physical distress. Continue with 30 min of aerobic exercise without signs/symptoms of physical distress.    Intensity THRR unchanged THRR unchanged THRR unchanged THRR unchanged      Progression   Progression Continue to progress workloads to maintain intensity without signs/symptoms of physical distress. Continue to progress workloads to maintain intensity without signs/symptoms of physical distress. Continue to progress workloads to maintain intensity without signs/symptoms of physical distress. Continue to progress workloads to maintain intensity without signs/symptoms of physical distress.    Average METs 1.86 2.34 2.21 2.09      Resistance Training   Training Prescription Yes Yes Yes Yes    Weight 2lb 2lb 2 lb 2 lb    Reps 10-15 10-15 10-15 10-15      Interval Training   Interval Training No No No No      NuStep   Level -- 1 1  T6 nustep 1  T6 nustep    Minutes -- 15 15 15     METs -- 2.4 3.1 2.5      T5 Nustep   Level 2  T6 -- -- --    Minutes 15 -- -- --    METs 1.9 -- -- --      Track   Laps 20 35 22 17    Minutes 15 30 15 15     METs 2.09 2.9 2.2 1.92      Home Exercise Plan   Plans to continue exercise at Home (comment)  walking Home (comment)  walking Home (comment)  walking Home (comment)  walking    Frequency Add 2 additional days to program exercise sessions. Add 2 additional days to program exercise sessions. Add 2 additional days to program exercise sessions. Add 2 additional days to program  exercise sessions.    Initial Home Exercises Provided 02/17/24 02/17/24 02/17/24 02/17/24      Oxygen   Maintain Oxygen Saturation 88% or higher 88% or higher 88% or higher 88% or higher             Exercise Comments:   Exercise Comments     Row Name 12/23/23 1128           Exercise Comments First full day of exercise!  Patient was oriented to gym and equipment including functions, settings, policies, and procedures.  Patient's individual exercise prescription and treatment plan were reviewed.  All starting workloads were established based on the results of the 6 minute walk test done at initial orientation visit.  The plan for exercise progression was also introduced and progression will be customized based on patient's performance and goals.                Exercise Goals and Review:   Exercise Goals     Row Name 12/18/23 1507             Exercise Goals   Increase Physical Activity Yes       Intervention Provide advice, education, support and counseling about physical activity/exercise needs.;Develop an individualized exercise prescription for aerobic and resistive training based on initial evaluation findings, risk stratification, comorbidities and participant's personal goals.       Expected Outcomes Short Term: Attend rehab on a regular basis to increase amount of physical activity.;Long Term: Add in home exercise to make exercise part of routine and to increase amount of physical activity.;Long Term: Exercising regularly at least 3-5 days a week.  Increase Strength and Stamina Yes       Intervention Provide advice, education, support and counseling about physical activity/exercise needs.;Develop an individualized exercise prescription for aerobic and resistive training based on initial evaluation findings, risk stratification, comorbidities and participant's personal goals.       Expected Outcomes Short Term: Increase workloads from initial exercise prescription  for resistance, speed, and METs.;Short Term: Perform resistance training exercises routinely during rehab and add in resistance training at home;Long Term: Improve cardiorespiratory fitness, muscular endurance and strength as measured by increased METs and functional capacity ( )       Able to understand and use rate of perceived exertion (RPE) scale Yes       Intervention Provide education and explanation on how to use RPE scale       Expected Outcomes Short Term: Able to use RPE daily in rehab to express subjective intensity level;Long Term:  Able to use RPE to guide intensity level when exercising independently       Able to understand and use Dyspnea scale Yes       Intervention Provide education and explanation on how to use Dyspnea scale       Expected Outcomes Short Term: Able to use Dyspnea scale daily in rehab to express subjective sense of shortness of breath during exertion;Long Term: Able to use Dyspnea scale to guide intensity level when exercising independently       Knowledge and understanding of Target Heart Rate Range (THRR) Yes       Intervention Provide education and explanation of THRR including how the numbers were predicted and where they are located for reference       Expected Outcomes Short Term: Able to state/look up THRR;Short Term: Able to use daily as guideline for intensity in rehab;Long Term: Able to use THRR to govern intensity when exercising independently       Able to check pulse independently Yes       Intervention Provide education and demonstration on how to check pulse in carotid and radial arteries.;Review the importance of being able to check your own pulse for safety during independent exercise       Expected Outcomes Short Term: Able to explain why pulse checking is important during independent exercise;Long Term: Able to check pulse independently and accurately       Understanding of Exercise Prescription Yes       Intervention Provide education,  explanation, and written materials on patient's individual exercise prescription       Expected Outcomes Short Term: Able to explain program exercise prescription;Long Term: Able to explain home exercise prescription to exercise independently                Exercise Goals Re-Evaluation :  Exercise Goals Re-Evaluation     Row Name 12/23/23 1129 01/07/24 1528 01/23/24 1654 02/04/24 1526 02/17/24 1145     Exercise Goal Re-Evaluation   Exercise Goals Review Increase Physical Activity;Able to understand and use rate of perceived exertion (RPE) scale;Knowledge and understanding of Target Heart Rate Range (THRR);Understanding of Exercise Prescription;Increase Strength and Stamina;Able to check pulse independently Increase Physical Activity;Increase Strength and Stamina;Understanding of Exercise Prescription Increase Physical Activity;Increase Strength and Stamina;Understanding of Exercise Prescription Increase Physical Activity;Increase Strength and Stamina;Understanding of Exercise Prescription Increase Physical Activity;Able to understand and use rate of perceived exertion (RPE) scale;Knowledge and understanding of Target Heart Rate Range (THRR);Understanding of Exercise Prescription;Increase Strength and Stamina;Able to check pulse independently   Comments Reviewed RPE and dyspnea scale, THR  and program prescription with pt today.  Pt voiced understanding and was given a copy of goals to take home. [name] is off to a good start in the program. He was able to increase his level on the T6 nustep to level 2. He was also able to maintain his track laps, and level on the biostep. We will continue to monitor his progress in the program. [name] is doing well in rehab. He was recently able to increase his level on the T4 nustep from 2 to 3. He was also able to maintain 21 laps on the track in 15 minutes. We will continue to monitor his progress in the program. [name] is doing well in rehab. He was able to  maintain 21 laps on the track and level 2 on the T6 nustep. We will continue to monitor his progress in the program. Reviewed home exercise with pt today.  Pt plans to walk at home for exercise.  Reviewed THR, pulse, RPE, sign and symptoms, pulse oximetery and when to call 911 or MD.  Also discussed weather considerations and indoor options.  Pt voiced understanding.   Expected Outcomes Short: Use RPE daily to regulate intensity.  Long: Follow program prescription in THR. Short: Continue to increase level on the T6 nustep, and push for more laps on the track. Long: Continue exercise to improve strength and stamina. Short: Continue to increase level on the T6 nustep, and push for more laps on the track. Long: Continue exercise to improve strength and stamina. Short: Continue to push for more laps on the track. Long: Continue exercise to improve strength and stamina. Short: walk at home 1-2 days a week on off days of rehab, following recommendations provided by staff in home exercise review. Long: maintain independent exercise routine upon graduation from cardiac rehab.    Row Name 02/17/24 1455 03/05/24 1609 03/20/24 0754 04/01/24 1125 04/02/24 1113     Exercise Goal Re-Evaluation   Exercise Goals Review Increase Physical Activity;Understanding of Exercise Prescription;Increase Strength and Stamina Increase Physical Activity;Understanding of Exercise Prescription;Increase Strength and Stamina Increase Physical Activity;Understanding of Exercise Prescription;Increase Strength and Stamina Increase Physical Activity;Understanding of Exercise Prescription;Increase Strength and Stamina Increase Physical Activity;Understanding of Exercise Prescription;Increase Strength and Stamina   Comments [name] is doing well in rehab. He walked 20 laps on the track and was able to maintain level 2 on the T6. We will continue to monitor his progress in the program. [name] continues to do well in rehab. He was able to maintain a  level 1 on the T6 nustep. He was also able to increase from 20 to 35 laps on the track. We will continue to monitor his progress in the program. [name] continues to do well in rehab. He was able to maintain a level 1 on the T6 nustep. He was also able to increase from 20 to 22 laps on the track when walking for 15 minutes. We will continue to monitor his progress in the program. [name] is doing well and continues to improve. He states that he continues to do exercise at home, which consists of walking outside. We will continue to monitor his progress in the program. [name] has only attended two sessions since the last review. He has continued to work at level 1 on the T6 nustep. He also saw a decrease in his laps walked on the track from 22 to 17 laps. We will continue to monitor his progress in the program.   Expected Outcomes Short: Push for more  laps on the track and try level 3 on the T6 nustep. Long: Continue exercise to improve strength and stamina. Short: Continue to increase track laps, return to level 2 on the T6 nustep. Long: Continue exercise to improve strength and stamina. Short: Increase to level 2 on the T6 nustep. Long: Continue exercise to improve strength and stamina. Short: Continue to exercise at home. Long: Continue exercise to improve strength and stamina. Short: Attend rehab more consistently. Long: Continue exercise to improve strength and stamina.            Discharge Exercise Prescription (Final Exercise Prescription Changes):  Exercise Prescription Changes - 04/02/24 1100       Response to Exercise   Blood Pressure (Admit) 114/62    Blood Pressure (Exit) 142/64    Heart Rate (Admit) 76 bpm    Heart Rate (Exercise) 103 bpm    Heart Rate (Exit) 87 bpm    Rating of Perceived Exertion (Exercise) 15    Symptoms none    Duration Continue with 30 min of aerobic exercise without signs/symptoms of physical distress.    Intensity THRR unchanged      Progression   Progression  Continue to progress workloads to maintain intensity without signs/symptoms of physical distress.    Average METs 2.09      Resistance Training   Training Prescription Yes    Weight 2 lb    Reps 10-15      Interval Training   Interval Training No      NuStep   Level 1   T6 nustep   Minutes 15    METs 2.5      Track   Laps 17    Minutes 15    METs 1.92      Home Exercise Plan   Plans to continue exercise at Home (comment)   walking   Frequency Add 2 additional days to program exercise sessions.    Initial Home Exercises Provided 02/17/24      Oxygen   Maintain Oxygen Saturation 88% or higher             Nutrition:  Target Goals: Understanding of nutrition guidelines, daily intake of sodium 1500mg , cholesterol 200mg , calories 30% from fat and 7% or less from saturated fats, daily to have 5 or more servings of fruits and vegetables.  Education: All About Nutrition: -Group instruction provided by verbal, written material, interactive activities, discussions, models, and posters to present general guidelines for heart healthy nutrition including fat, fiber, MyPlate, the role of sodium in heart healthy nutrition, utilization of the nutrition label, and utilization of this knowledge for meal planning. Follow up email sent as well. Written material given at graduation. Flowsheet Row Cardiac Rehab from 12/18/2023 in [facility] Cardiac and Pulmonary Rehab  Education need identified 12/18/23       Biometrics:  Pre Biometrics - 12/18/23 1508       Pre Biometrics   Height 5' 8.4" (1.737 m)    Weight 174 lb 1.6 oz (79 kg)    Waist Circumference 42.5 inches    Hip Circumference 38.5 inches    Waist to Hip Ratio 1.1 %    BMI (Calculated) 26.17    Single Leg Stand 0.5 seconds              Nutrition Therapy Plan and Nutrition Goals:  Nutrition Therapy & Goals - 12/18/23 1509       Nutrition Therapy   RD appointment deferred Yes  Personal Nutrition Goals    Nutrition Goal RD appointment deferred      Intervention Plan   Intervention Prescribe, educate and counsel regarding individualized specific dietary modifications aiming towards targeted core components such as weight, hypertension, lipid management, diabetes, heart failure and other comorbidities.;Nutrition handout(s) given to patient.    Expected Outcomes Short Term Goal: Understand basic principles of dietary content, such as calories, fat, sodium, cholesterol and nutrients.             Nutrition Assessments:  MEDIFICTS Score Key: >=70 Need to make dietary changes  40-70 Heart Healthy Diet <= 40 Therapeutic Level Cholesterol Diet  Flowsheet Row Cardiac Rehab from 12/18/2023 in [facility] Cardiac and Pulmonary Rehab  Picture Your Plate Total Score on Admission 67      Picture Your Plate Scores: <16 Unhealthy dietary pattern with much room for improvement. 41-50 Dietary pattern unlikely to meet recommendations for good health and room for improvement. 51-60 More healthful dietary pattern, with some room for improvement.  >60 Healthy dietary pattern, although there may be some specific behaviors that could be improved.    Nutrition Goals Re-Evaluation:  Nutrition Goals Re-Evaluation     Row Name 04/01/24 1116             Goals   Nutrition Goal RD appointment deferred                Nutrition Goals Discharge (Final Nutrition Goals Re-Evaluation):  Nutrition Goals Re-Evaluation - 04/01/24 1116       Goals   Nutrition Goal RD appointment deferred             Psychosocial: Target Goals: Acknowledge presence or absence of significant depression and/or stress, maximize coping skills, provide positive support system. Participant is able to verbalize types and ability to use techniques and skills needed for reducing stress and depression.   Education: Stress, Anxiety, and Depression - Group verbal and visual presentation to define topics covered.  Reviews how body  is impacted by stress, anxiety, and depression.  Also discusses healthy ways to reduce stress and to treat/manage anxiety and depression.  Written material given at graduation. Flowsheet Row Cardiac Rehab from 07/15/2023 in [name] Cardiac and Pulmonary Rehab  Education need identified 07/15/23       Education: Sleep Hygiene -Provides group verbal and written instruction about how sleep can affect your health.  Define sleep hygiene, discuss sleep cycles and impact of sleep habits. Review good sleep hygiene tips.    Initial Review & Psychosocial Screening:  Initial Psych Review & Screening - 12/17/23 1305       Initial Review   Current issues with Current Stress Concerns    Source of Stress Concerns Unable to participate in former interests or hobbies;Unable to perform yard/household activities      Family Dynamics   Good Support System? Yes   wife and family     Barriers   Psychosocial barriers to participate in program There are no identifiable barriers or psychosocial needs.;The patient should benefit from training in stress management and relaxation.      Screening Interventions   Interventions Encouraged to exercise;Provide feedback about the scores to participant;To provide support and resources with identified psychosocial needs    Expected Outcomes Short Term goal: Utilizing psychosocial counselor, staff and physician to assist with identification of specific Stressors or current issues interfering with healing process. Setting desired goal for each stressor or current issue identified.;Long Term Goal: Stressors or current issues are controlled or eliminated.;Short  Term goal: Identification and review with participant of any Quality of Life or Depression concerns found by scoring the questionnaire.;Long Term goal: The participant improves quality of Life and PHQ9 Scores as seen by post scores and/or verbalization of changes             Quality of Life Scores:   Quality of Life  - 12/18/23 1509       Quality of Life Scores   Health/Function Pre 25 %    Socioeconomic Pre 27.92 %    Psych/Spiritual Pre 28 %    Family Pre 30 %    GLOBAL Pre 27.09 %            Scores of 19 and below usually indicate a poorer quality of life in these areas.  A difference of  2-3 points is a clinically meaningful difference.  A difference of 2-3 points in the total score of the Quality of Life Index has been associated with significant improvement in overall quality of life, self-image, physical symptoms, and general health in studies assessing change in quality of life.  PHQ-9: Review Flowsheet       12/18/2023 07/15/2023  Depression screen PHQ 2/9  Decreased Interest 0 0  Down, Depressed, Hopeless 0 0  PHQ - 2 Score 0 0  Altered sleeping 0 0  Tired, decreased energy 1 2  Change in appetite 0 1  Feeling bad or failure about yourself  0 0  Trouble concentrating 2 3  Moving slowly or fidgety/restless 3 0  Suicidal thoughts 0 0  PHQ-9 Score 6 6  Difficult doing work/chores Extremely dIfficult Somewhat difficult   Interpretation of Total Score  Total Score Depression Severity:  1-4 = Minimal depression, 5-9 = Mild depression, 10-14 = Moderate depression, 15-19 = Moderately severe depression, 20-27 = Severe depression   Psychosocial Evaluation and Intervention:  Psychosocial Evaluation - 12/17/23 1314       Psychosocial Evaluation & Interventions   Comments 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  He had a stroke in the past that caused difficulty in word processing, so his wife mainly spoke on the phone during the orientation phone call. 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 He has been walking more and has more energy. When asked about stress, it was mentioned that he really wants to get back to what he wants to do. His wife helps him when needed and notes that he really enjoyed cardiac rehab in  the past so they both think this will help him get closer to where he wants to be, He has a good support system. He reports no sleep concerns or concerns with anxiety or depression.    Expected Outcomes Short: attend cardiac rehab for education and exercise. Long: devleop and maintain positive self care habits    Continue Psychosocial Services  Follow up required by staff             Psychosocial Re-Evaluation:  Psychosocial Re-Evaluation     Row Name 04/01/24 1112             Psychosocial Re-Evaluation   Current issues with None Identified       Comments [name] states that he does not have any stress at this time. He has a good support system made up of his two sons and his wife. He reports no sleep concerns at this time.  Expected Outcomes Short: Continue to attend cardiac rehab for stress relief. Long: Continue to maintain positive outlook.       Interventions Encouraged to attend Cardiac Rehabilitation for the exercise       Continue Psychosocial Services  Follow up required by staff                Psychosocial Discharge (Final Psychosocial Re-Evaluation):  Psychosocial Re-Evaluation - 04/01/24 1112       Psychosocial Re-Evaluation   Current issues with None Identified    Comments [name] states that he does not have any stress at this time. He has a good support system made up of his two sons and his wife. He reports no sleep concerns at this time.    Expected Outcomes Short: Continue to attend cardiac rehab for stress relief. Long: Continue to maintain positive outlook.    Interventions Encouraged to attend Cardiac Rehabilitation for the exercise    Continue Psychosocial Services  Follow up required by staff             Vocational Rehabilitation: Provide vocational rehab assistance to qualifying candidates.   Vocational Rehab Evaluation & Intervention:   Education: Education Goals: Education classes will be provided on a variety of topics geared  toward better understanding of heart health and risk factor modification. Participant will state understanding/return demonstration of topics presented as noted by education test scores.  Learning Barriers/Preferences:   General Cardiac Education Topics:  AED/CPR: - Group verbal and written instruction with the use of models to demonstrate the basic use of the AED with the basic ABC's of resuscitation.   Anatomy and Cardiac Procedures: - Group verbal and visual presentation and models provide information about basic cardiac anatomy and function. Reviews the testing methods done to diagnose heart disease and the outcomes of the test results. Describes the treatment choices: Medical Management, Angioplasty, or Coronary Bypass Surgery for treating various heart conditions including Myocardial Infarction, Angina, Valve Disease, and Cardiac Arrhythmias.  Written material given at graduation. Flowsheet Row Cardiac Rehab from 12/18/2023 in [facility] Cardiac and Pulmonary Rehab  Education need identified 12/18/23       Medication Safety: - Group verbal and visual instruction to review commonly prescribed medications for heart and lung disease. Reviews the medication, class of the drug, and side effects. Includes the steps to properly store meds and maintain the prescription regimen.  Written material given at graduation.   Intimacy: - Group verbal instruction through game format to discuss how heart and lung disease can affect sexual intimacy. Written material given at graduation.. Flowsheet Row Cardiac Rehab from 12/18/2023 in [facility] Cardiac and Pulmonary Rehab  Education need identified 12/18/23       Know Your Numbers and Heart Failure: - Group verbal and visual instruction to discuss disease risk factors for cardiac and pulmonary disease and treatment options.  Reviews associated critical values for Overweight/Obesity, Hypertension, Cholesterol, and Diabetes.  Discusses basics of heart failure:  signs/symptoms and treatments.  Introduces Heart Failure Zone chart for action plan for heart failure.  Written material given at graduation.   Infection Prevention: - Provides verbal and written material to individual with discussion of infection control including proper hand washing and proper equipment cleaning during exercise session. Flowsheet Row Cardiac Rehab from 12/18/2023 in [facility] Cardiac and Pulmonary Rehab  Date 12/18/23  Educator MB  Instruction Review Code 1- Verbalizes Understanding       Falls Prevention: - Provides verbal and written material to individual with discussion of falls  prevention and safety. Flowsheet Row Cardiac Rehab from 12/18/2023 in [facility] Cardiac and Pulmonary Rehab  Date 12/18/23  Educator MB  Instruction Review Code 1- Verbalizes Understanding       Other: -Provides group and verbal instruction on various topics (see comments)   Knowledge Questionnaire Score:  Knowledge Questionnaire Score - 12/18/23 1511       Knowledge Questionnaire Score   Pre Score 20/26             Core Components/Risk Factors/Patient Goals at Admission:  Personal Goals and Risk Factors at Admission - 12/18/23 1511       Core Components/Risk Factors/Patient Goals on Admission    Weight Management Yes;Weight Maintenance    Intervention Weight Management: Develop a combined nutrition and exercise program designed to reach desired caloric intake, while maintaining appropriate intake of nutrient and fiber, sodium and fats, and appropriate energy expenditure required for the weight goal.;Weight Management: Provide education and appropriate resources to help participant work on and attain dietary goals.;Weight Management/Obesity: Establish reasonable short term and long term weight goals.    Admit Weight 174 lb 1.6 oz (79 kg)    Goal Weight: Short Term 174 lb (78.9 kg)    Goal Weight: Long Term 174 lb (78.9 kg)    Expected Outcomes Short Term: Continue to assess and  modify interventions until short term weight is achieved;Weight Maintenance: Understanding of the daily nutrition guidelines, which includes 25-35% calories from fat, 7% or less cal from saturated fats, less than 200mg  cholesterol, less than 1.5gm of sodium, & 5 or more servings of fruits and vegetables daily;Understanding recommendations for meals to include 15-35% energy as protein, 25-35% energy from fat, 35-60% energy from carbohydrates, less than 200mg  of dietary cholesterol, 20-35 gm of total fiber daily;Understanding of distribution of calorie intake throughout the day with the consumption of 4-5 meals/snacks;Long Term: Adherence to nutrition and physical activity/exercise program aimed toward attainment of established weight goal    Heart Failure Yes    Intervention Provide a combined exercise and nutrition program that is supplemented with education, support and counseling about heart failure. Directed toward relieving symptoms such as shortness of breath, decreased exercise tolerance, and extremity edema.    Expected Outcomes Improve functional capacity of life;Short term: Attendance in program 2-3 days a week with increased exercise capacity. Reported lower sodium intake. Reported increased fruit and vegetable intake. Reports medication compliance.;Long term: Adoption of self-care skills and reduction of barriers for early signs and symptoms recognition and intervention leading to self-care maintenance.;Short term: Daily weights obtained and reported for increase. Utilizing diuretic protocols set by physician.    Hypertension Yes    Intervention Provide education on lifestyle modifcations including regular physical activity/exercise, weight management, moderate sodium restriction and increased consumption of fresh fruit, vegetables, and low fat dairy, alcohol moderation, and smoking cessation.;Monitor prescription use compliance.    Expected Outcomes Short Term: Continued assessment and intervention  until BP is < 140/46mm HG in hypertensive participants. < 130/63mm HG in hypertensive participants with diabetes, heart failure or chronic kidney disease.;Long Term: Maintenance of blood pressure at goal levels.    Lipids Yes    Intervention Provide education and support for participant on nutrition & aerobic/resistive exercise along with prescribed medications to achieve LDL 70mg , HDL >40mg .    Expected Outcomes Short Term: Participant states understanding of desired cholesterol values and is compliant with medications prescribed. Participant is following exercise prescription and nutrition guidelines.;Long Term: Cholesterol controlled with medications as prescribed, with individualized exercise RX  and with personalized nutrition plan. Value goals: LDL < 70mg , HDL > 40 mg.             Education:Diabetes - Individual verbal and written instruction to review signs/symptoms of diabetes, desired ranges of glucose level fasting, after meals and with exercise. Acknowledge that pre and post exercise glucose checks will be done for 3 sessions at entry of program.   Core Components/Risk Factors/Patient Goals Review:   Goals and Risk Factor Review     Row Name 04/01/24 1118             Core Components/Risk Factors/Patient Goals Review   Personal Goals Review Weight Management/Obesity;Hypertension       Review [name] states that he does not take his BP at home very frequently. His BP has been within a healthy range on program days, today he measured 118/68. [name] is still maintaining his weight, he state that his wife manages his diet.       Expected Outcomes Short: Check BP at home more regularly. Long: Continue to manage risk factors.                Core Components/Risk Factors/Patient Goals at Discharge (Final Review):   Goals and Risk Factor Review - 04/01/24 1118       Core Components/Risk Factors/Patient Goals Review   Personal Goals Review Weight Management/Obesity;Hypertension     Review [name] states that he does not take his BP at home very frequently. His BP has been within a healthy range on program days, today he measured 118/68. [name] is still maintaining his weight, he state that his wife manages his diet.    Expected Outcomes Short: Check BP at home more regularly. Long: Continue to manage risk factors.             ITP Comments:  ITP Comments     Row Name 12/17/23 1320 12/18/23 1501 12/23/23 1128 01/15/24 0727 02/12/24 0757   ITP Comments Initial phone call completed. Diagnosis can be found in [facility] 12/12/23. EP Orientation scheduled for Wednesday 1/15 at 1pm. Completed and gym orientation. Initial ITP created and sent for review to Dr. Firman Hughes, Medical Director. First full day of exercise!  Patient was oriented to gym and equipment including functions, settings, policies, and procedures.  Patient's individual exercise prescription and treatment plan were reviewed.  All starting workloads were established based on the results of the 6 minute walk test done at initial orientation visit.  The plan for exercise progression was also introduced and progression will be customized based on patient's performance and goals. 30 Day review completed. Medical Director ITP review done, changes made as directed, and signed approval by Medical Director.    new to program 30 Day review completed. Medical Director ITP review done, changes made as directed, and signed approval by Medical Director.    Row Name 03/11/24 0732 04/08/24 0827         ITP Comments 30 Day review completed. Medical Director ITP review done, changes made as directed, and signed approval by Medical Director. 30 Day review completed. Medical Director ITP review done, changes made as directed, and signed approval by Medical Director.               Comments:

## 2024-04-10 ENCOUNTER — Encounter: Payer: Self-pay | Admitting: Podiatry

## 2024-04-10 NOTE — Progress Notes (Signed)
  Subjective:  Patient ID: [name]., male    DOB: [birth year],  MRN: [id number]  [name]. presents to clinic today for at risk foot care with history of peripheral neuropathy and preulcerative lesion(s) L 3rd toe and painful mycotic toenails that limit ambulation. Painful toenails interfere with ambulation. Aggravating factors include wearing enclosed shoe gear. Pain is relieved with periodic professional debridement. Painful preulcerative lesion(s) is/are aggravated when weightbearing with and without shoegear. Pain is relieved with periodic professional debridement.  Chief Complaint  Patient presents with   Nail Problem    "Cut his toenails."   New problem(s): None.   PCP is Calvin Helms, MD.  Allergies  Allergen Reactions   Penicillins Rash    Rash   Gabapentin    Penicillin G    Codeine Nausea Only and Nausea And Vomiting    Other reaction(s): Vomiting Other reaction(s): Vomiting    Review of Systems: Negative except as noted in the HPI.  Objective: No changes noted in today's physical examination. There were no vitals filed for this visit. [name]. is a pleasant 89 y.o. male WD, WN in NAD. AAO x 3.  Vascular Examination: Capillary refill time immediate b/l. Vascular status intact b/l with palpable pedal pulses. Pedal hair present b/l. No pain with calf compression b/l. Skin temperature gradient WNL b/l. No cyanosis or clubbing b/l. No ischemia or gangrene noted b/l.   Neurological Examination: Sensation grossly intact b/l with 10 gram monofilament. Pt has subjective symptoms of neuropathy.  Dermatological Examination: Pedal skin with normal turgor, texture and tone b/l.  No open wounds. No interdigital macerations.   Toenails 1-5 b/l thick, discolored, elongated with subungual debris and pain on dorsal palpation.   Preulcerative lesion noted left third digit. There is visible subdermal hemorrhage. There is no  surrounding erythema, no edema, no drainage, no odor, no fluctuance.  Musculoskeletal Examination: Muscle strength 5/5 to all lower extremity muscle groups bilaterally. Clawtoe deformity L 3rd toe.  Radiographs: None  Last A1c:      Latest Ref Rng & Units 06/15/2023    9:52 PM 06/15/2023    4:55 PM  Hemoglobin A1C  Hemoglobin-A1c 4.8 - 5.6 % 6.3  6.3    Assessment/Plan: 1. Pain due to onychomycosis of toenail   2. Pre-ulcerative corn or callous   3. Peripheral polyneuropathy     -Patient's family member present. All questions/concerns addressed on today's visit. -Patient to continue soft, supportive shoe gear daily. -Toenails 1-5 b/l were debrided in length and girth with sterile nail nippers and dremel without iatrogenic bleeding.  -Preulcerative lesion pared L 3rd toe utilizing sterile scalpel blade. Total number pared=1. -Patient/POA to call should there be question/concern in the interim.   Return in about 3 months (around 07/03/2024).  Luella Sager, DPM      [facility] LOCATION: [address] [place] [address]                   Office [phone]   [facility] LOCATION: [address] [place], [place] [address] Office [phone]

## 2024-04-13 ENCOUNTER — Encounter: Payer: Medicare Other | Admitting: *Deleted

## 2024-04-13 DIAGNOSIS — Z952 Presence of prosthetic heart valve: Secondary | ICD-10-CM | POA: Diagnosis not present

## 2024-04-13 NOTE — Progress Notes (Signed)
 Daily Session Note  Patient Details  Name: [name]. MRN: [id number] Date of Birth: Jun 08, 1933 Referring Provider:   Flowsheet Row Cardiac Rehab from 12/18/2023 in [facility] Cardiac and Pulmonary Rehab  Referring Provider Shary Deems, MD       Encounter Date: 04/13/2024  Check In:  Session Check In - 04/13/24 1119       Check-In   Supervising physician immediately available to respond to emergencies See telemetry face sheet for immediately available ER MD    Location [facility]-Cardiac & Pulmonary Rehab    Staff Present Maud Sorenson, RN, BSN, CCRP;Joseph Hood RCP,RRT,BSRT;[employer], Exercise Physiologist;Kelly [employer], ACSM CEP, Exercise Physiologist;Margaret Best, MS, Exercise Physiologist    Virtual Visit No    Medication changes reported     No    Fall or balance concerns reported    No    Warm-up and Cool-down Performed on first and last piece of equipment    Resistance Training Performed Yes    VAD Patient? No    PAD/SET Patient? No      Pain Assessment   Currently in Pain? No/denies                Social History   Tobacco Use  Smoking Status Never  Smokeless Tobacco Never    Goals Met:  Independence with exercise equipment Exercise tolerated well No report of concerns or symptoms today  Goals Unmet:  Not Applicable  Comments: Pt able to follow exercise prescription today without complaint.  Will continue to monitor for progression.    Dr. Firman Hughes is Medical Director for [facility] Cardiac Rehabilitation.  Dr. Fuad Aleskerov is Medical Director for [facility] Pulmonary Rehabilitation.

## 2024-04-17 ENCOUNTER — Encounter

## 2024-04-17 DIAGNOSIS — Z952 Presence of prosthetic heart valve: Secondary | ICD-10-CM

## 2024-04-17 NOTE — Progress Notes (Signed)
 Daily Session Note  Patient Details  Name: [name]. MRN: [id number] Date of Birth: [birth year] Referring Provider:   Flowsheet Row Cardiac Rehab from 12/18/2023 in [facility] Cardiac and Pulmonary Rehab  Referring Provider Shary Deems, MD       Encounter Date: 04/17/2024  Check In:  Session Check In - 04/17/24 1054       Check-In   Supervising physician immediately available to respond to emergencies See telemetry face sheet for immediately available ER MD    Location [facility]-Cardiac & Pulmonary Rehab    Staff Present Maud Sorenson, RN, BSN, CCRP;Joseph Hood RCP,RRT,BSRT;Maxon [place] BS, Exercise Physiologist;Noah Tickle, BS, Exercise Physiologist    Virtual Visit No    Medication changes reported     No    Fall or balance concerns reported    No    Warm-up and Cool-down Performed on first and last piece of equipment    Resistance Training Performed Yes    VAD Patient? No    PAD/SET Patient? No      Pain Assessment   Currently in Pain? No/denies                Social History   Tobacco Use  Smoking Status Never  Smokeless Tobacco Never    Goals Met:  Independence with exercise equipment Exercise tolerated well No report of concerns or symptoms today  Goals Unmet:  Not Applicable  Comments: Pt able to follow exercise prescription today without complaint.  Will continue to monitor for progression.    Dr. Firman Hughes is Medical Director for [facility] Cardiac Rehabilitation.  Dr. Fuad Aleskerov is Medical Director for [facility] Pulmonary Rehabilitation.

## 2024-04-20 ENCOUNTER — Encounter: Admitting: *Deleted

## 2024-04-20 DIAGNOSIS — Z952 Presence of prosthetic heart valve: Secondary | ICD-10-CM | POA: Diagnosis not present

## 2024-04-20 NOTE — Progress Notes (Signed)
 Daily Session Note  Patient Details  Name: [name]. MRN: [id number] Date of Birth: Dec 23, 1932 Referring Provider:   Flowsheet Row Cardiac Rehab from 12/18/2023 in [facility] Cardiac and Pulmonary Rehab  Referring Provider Shary Deems, MD       Encounter Date: 04/20/2024  Check In:  Session Check In - 04/20/24 1117       Check-In   Supervising physician immediately available to respond to emergencies See telemetry face sheet for immediately available ER MD    Location [facility]-Cardiac & Pulmonary Rehab    Staff Present Maud Sorenson, RN, BSN, CCRP;Maxon Conetta BS, Exercise Physiologist;Kelly [employer], ACSM CEP, Exercise Physiologist    Virtual Visit No    Medication changes reported     No    Fall or balance concerns reported    No    Warm-up and Cool-down Performed on first and last piece of equipment    Resistance Training Performed Yes    VAD Patient? No    PAD/SET Patient? No      Pain Assessment   Currently in Pain? No/denies                Social History   Tobacco Use  Smoking Status Never  Smokeless Tobacco Never    Goals Met:  Independence with exercise equipment Exercise tolerated well No report of concerns or symptoms today  Goals Unmet:  Not Applicable  Comments: Pt able to follow exercise prescription today without complaint.  Will continue to monitor for progression.    Dr. Firman Hughes is Medical Director for [facility] Cardiac Rehabilitation.  Dr. Fuad Aleskerov is Medical Director for [facility] Pulmonary Rehabilitation.

## 2024-04-22 ENCOUNTER — Encounter: Admitting: *Deleted

## 2024-04-22 VITALS — Ht 68.4 in | Wt 174.1 lb

## 2024-04-22 DIAGNOSIS — Z952 Presence of prosthetic heart valve: Secondary | ICD-10-CM | POA: Diagnosis not present

## 2024-04-22 NOTE — Patient Instructions (Signed)
 Discharge Patient Instructions  Patient Details  Name: [name]. MRN: [id number] Date of Birth: Jan 21, 1933 Referring Provider:  Yehuda Helms, MD   Number of Visits: 100  Reason for Discharge:  Patient reached a stable level of exercise. Patient independent in their exercise. Patient has met program and personal goals.  Diagnosis:  No diagnosis found.  Initial Exercise Prescription:  Initial Exercise Prescription - 12/18/23 1500       Date of Initial Exercise RX and Referring Provider   Date 12/18/23    Referring Provider Shary Deems, MD      Oxygen   Maintain Oxygen Saturation 88% or higher      Recumbant Bike   Level 1    RPM 50    Watts 15    Minutes 15    METs 1.16      NuStep   Level 1    SPM 80    Minutes 15    METs 1.16      Biostep-RELP   Level 1    Minutes 15    METs 1.16      Track   Laps 21    Minutes 15    METs 1.16      Prescription Details   Frequency (times per week) 2    Duration Progress to 30 minutes of continuous aerobic without signs/symptoms of physical distress      Intensity   THRR 40-80% of Max Heartrate 90-118    Ratings of Perceived Exertion 11-13    Perceived Dyspnea 0-4      Progression   Progression Continue to progress workloads to maintain intensity without signs/symptoms of physical distress.      Resistance Training   Training Prescription Yes    Weight 2lb    Reps 10-15             Discharge Exercise Prescription (Final Exercise Prescription Changes):  Exercise Prescription Changes - 04/16/24 1500       Response to Exercise   Blood Pressure (Admit) 114/68    Blood Pressure (Exit) 126/72    Heart Rate (Admit) 76 bpm    Heart Rate (Exercise) 97 bpm    Heart Rate (Exit) 75 bpm    Rating of Perceived Exertion (Exercise) 13    Symptoms none    Duration Continue with 30 min of aerobic exercise without signs/symptoms of physical distress.    Intensity THRR unchanged       Progression   Progression Continue to progress workloads to maintain intensity without signs/symptoms of physical distress.    Average METs 2.14      Resistance Training   Training Prescription Yes    Weight 2 lb    Reps 10-15      Interval Training   Interval Training No      NuStep   Level 2   T6 nustep   Minutes 15    METs 1.9      Track   Laps 17    Minutes 15    METs 1.92      Home Exercise Plan   Plans to continue exercise at Home (comment)   walking   Frequency Add 2 additional days to program exercise sessions.    Initial Home Exercises Provided 02/17/24      Oxygen   Maintain Oxygen Saturation 88% or higher             Functional Capacity:  6 Minute Walk     Row Name  12/18/23 1501 04/22/24 1125       6 Minute Walk   Phase Initial Discharge    Distance 805 feet 750 feet    Distance % Change -- -6.8 %    Distance Feet Change -- -55 ft    Walk Time 6 minutes 6 minutes    # of Rest Breaks 0 0    MPH 1.52 1.4    METS 1.16 1    RPE 11 13    Perceived Dyspnea  0 1    VO2 Peak 4.06 3.19    Symptoms No No    Resting HR 64 bpm 71 bpm    Resting BP 146/86 116/62    Resting Oxygen Saturation  97 % 97 %    Exercise Oxygen Saturation  during 6 min walk 96 % 95 %    Max Ex. HR 90 bpm 84 bpm    Max Ex. BP 130/60 132/72    2 Minute Post BP 120/64 --             Nutrition & Weight - Outcomes:  Pre Biometrics - 12/18/23 1508       Pre Biometrics   Height 5' 8.4" (1.737 m)    Weight 174 lb 1.6 oz (79 kg)    Waist Circumference 42.5 inches    Hip Circumference 38.5 inches    Waist to Hip Ratio 1.1 %    BMI (Calculated) 26.17    Single Leg Stand 0.5 seconds             Post Biometrics - 04/22/24 1127        Post  Biometrics   Height 5' 8.4" (1.737 m)    Weight 174 lb 1.6 oz (79 kg)    Waist Circumference 41.5 inches    Hip Circumference 40 inches    Waist to Hip Ratio 1.04 %    BMI (Calculated) 26.17    Single Leg Stand 0 seconds

## 2024-04-22 NOTE — Progress Notes (Signed)
 Daily Session Note  Patient Details  Name: [name]. MRN: [id number] Date of Birth: 1933/11/09 Referring Provider:   Flowsheet Row Cardiac Rehab from 12/18/2023 in [facility] Cardiac and Pulmonary Rehab  Referring Provider Shary Deems, MD       Encounter Date: 04/22/2024  Check In:  Session Check In - 04/22/24 1322       Check-In   Supervising physician immediately available to respond to emergencies See telemetry face sheet for immediately available ER MD    Location [facility]-Cardiac & Pulmonary Rehab    Staff Present Maud Sorenson, RN, BSN, CCRP;Maxon Conetta BS, Exercise Physiologist;Noah Tickle, BS, Exercise Physiologist;Jason Martina Sledge RDN,LDN    Virtual Visit No    Medication changes reported     No    Fall or balance concerns reported    No    Warm-up and Cool-down Performed on first and last piece of equipment    Resistance Training Performed Yes    VAD Patient? No    PAD/SET Patient? No      Pain Assessment   Currently in Pain? No/denies                Social History   Tobacco Use  Smoking Status Never  Smokeless Tobacco Never    Goals Met:  Independence with exercise equipment Exercise tolerated well No report of concerns or symptoms today  Goals Unmet:  Not Applicable  Comments: Pt able to follow exercise prescription today without complaint.  Will continue to monitor for progression.    Dr. Firman Hughes is Medical Director for [facility] Cardiac Rehabilitation.  Dr. Fuad Aleskerov is Medical Director for [facility] Pulmonary Rehabilitation.

## 2024-04-29 ENCOUNTER — Encounter: Admitting: *Deleted

## 2024-04-29 DIAGNOSIS — Z952 Presence of prosthetic heart valve: Secondary | ICD-10-CM

## 2024-04-29 NOTE — Progress Notes (Signed)
 Daily Session Note  Patient Details  Name: [name]. MRN: [id number] Date of Birth: [birth year] Referring Provider:   Flowsheet Row Cardiac Rehab from 12/18/2023 in [facility] Cardiac and Pulmonary Rehab  Referring Provider Shary Deems, MD       Encounter Date: 04/29/2024  Check In:  Session Check In - 04/29/24 1116       Check-In   Supervising physician immediately available to respond to emergencies See telemetry face sheet for immediately available ER MD    Location [facility]-Cardiac & Pulmonary Rehab    Staff Present Maud Sorenson, RN, BSN, CCRP;Joseph Hood RCP,RRT,BSRT;Maxon Runge BS, Exercise Physiologist;Noah Tickle, BS, Exercise Physiologist    Virtual Visit No    Medication changes reported     No    Fall or balance concerns reported    No    Warm-up and Cool-down Performed on first and last piece of equipment    Resistance Training Performed Yes    VAD Patient? No    PAD/SET Patient? No      Pain Assessment   Currently in Pain? No/denies                Social History   Tobacco Use  Smoking Status Never  Smokeless Tobacco Never    Goals Met:  Independence with exercise equipment Exercise tolerated well No report of concerns or symptoms today  Goals Unmet:  Not Applicable  Comments: Pt able to follow exercise prescription today without complaint.  Will continue to monitor for progression.    Dr. Firman Hughes is Medical Director for [facility] Cardiac Rehabilitation.  Dr. Fuad Aleskerov is Medical Director for [facility] Pulmonary Rehabilitation.

## 2024-05-04 ENCOUNTER — Encounter: Attending: Cardiology

## 2024-05-04 DIAGNOSIS — Z952 Presence of prosthetic heart valve: Secondary | ICD-10-CM | POA: Diagnosis present

## 2024-05-04 NOTE — Progress Notes (Signed)
 Daily Session Note  Patient Details  Name: Linkin Vizzini. MRN: [id number] Date of Birth: 1933-07-21 Referring Provider:   Flowsheet Row Cardiac Rehab from 12/18/2023 in [facility] Cardiac and Pulmonary Rehab  Referring Provider Shary Deems, MD       Encounter Date: 05/04/2024  Check In:  Session Check In - 05/04/24 1102       Check-In   Supervising physician immediately available to respond to emergencies See telemetry face sheet for immediately available ER MD    Location [facility]-Cardiac & Pulmonary Rehab    Staff Present Lyell Samuel, MS, Exercise Physiologist;Ahniya Mitchum Dawne Euler, ACSM CEP, Exercise Physiologist;Conny Moening RN,BSN,MPA;Joseph Hood RCP,RRT,BSRT    Virtual Visit No    Medication changes reported     No    Fall or balance concerns reported    No    Tobacco Cessation No Change    Warm-up and Cool-down Performed on first and last piece of equipment    Resistance Training Performed Yes    VAD Patient? No    PAD/SET Patient? No      Pain Assessment   Currently in Pain? No/denies                Social History   Tobacco Use  Smoking Status Never  Smokeless Tobacco Never    Goals Met:  Independence with exercise equipment Exercise tolerated well Personal goals reviewed No report of concerns or symptoms today Strength training completed today  Goals Unmet:  Not Applicable  Comments: Pt able to follow exercise prescription today without complaint.  Will continue to monitor for progression.    Dr. Firman Hughes is Medical Director for [facility] Cardiac Rehabilitation.  Dr. Fuad Aleskerov is Medical Director for [facility] Pulmonary Rehabilitation.

## 2024-05-06 ENCOUNTER — Encounter

## 2024-05-06 ENCOUNTER — Encounter: Payer: Self-pay | Admitting: *Deleted

## 2024-05-06 DIAGNOSIS — Z952 Presence of prosthetic heart valve: Secondary | ICD-10-CM | POA: Diagnosis not present

## 2024-05-06 NOTE — Progress Notes (Signed)
 Daily Session Note  Patient Details  Name: [name]. MRN: [id number] Date of Birth: 1933-03-11 Referring Provider:   Flowsheet Row Cardiac Rehab from 12/18/2023 in [facility] Cardiac and Pulmonary Rehab  Referring Provider Shary Deems, MD       Encounter Date: 05/06/2024  Check In:  Session Check In - 05/06/24 1056       Check-In   Supervising physician immediately available to respond to emergencies See telemetry face sheet for immediately available ER MD    Location [facility]-Cardiac & Pulmonary Rehab    Staff Present Sherle Dire, BS, Exercise Physiologist;Daysen Gundrum RN,BSN,MPA;Joseph Hood RCP,RRT,BSRT;Jason Martina Sledge RDN,LDN    Virtual Visit No    Medication changes reported     No    Fall or balance concerns reported    No    Tobacco Cessation No Change    Warm-up and Cool-down Performed on first and last piece of equipment    Resistance Training Performed Yes    VAD Patient? No    PAD/SET Patient? No      Pain Assessment   Currently in Pain? No/denies                Social History   Tobacco Use  Smoking Status Never  Smokeless Tobacco Never    Goals Met:  Independence with exercise equipment Exercise tolerated well No report of concerns or symptoms today Strength training completed today  Goals Unmet:  Not Applicable  Comments: Pt able to follow exercise prescription today without complaint.  Will continue to monitor for progression.    Dr. Firman Hughes is Medical Director for [facility] Cardiac Rehabilitation.  Dr. Fuad Aleskerov is Medical Director for [facility] Pulmonary Rehabilitation.

## 2024-05-06 NOTE — Progress Notes (Signed)
 Cardiac Individual Treatment Plan  Patient Details  Name: [name]. MRN: [id number] Date of Birth: 1933-02-05 Referring Provider:   Flowsheet Row Cardiac Rehab from 12/18/2023 in [facility] Cardiac and Pulmonary Rehab  Referring Provider Shary Deems, MD       Initial Encounter Date:  Flowsheet Row Cardiac Rehab from 12/18/2023 in [facility] Cardiac and Pulmonary Rehab  Date 12/18/23       Visit Diagnosis: S/P TAVR (transcatheter aortic valve replacement)  Patient's Home Medications on Admission:  Current Outpatient Medications:    acetaminophen  (TYLENOL ) 650 MG CR tablet, Take 650 mg by mouth every 8 (eight) hours as needed. , Disp: , Rfl:    apixaban  (ELIQUIS ) 2.5 MG TABS tablet, Take 1 tablet (2.5 mg total) by mouth 2 (two) times daily., Disp: 180 tablet, Rfl: 3   Ascorbic Acid (VITAMIN C ) 1000 MG tablet, Take 1,000 mg by mouth daily., Disp: , Rfl:    atorvastatin  (LIPITOR) 20 MG tablet, Take 20 mg by mouth at bedtime., Disp: , Rfl:    b complex vitamins tablet, Take 1 tablet by mouth daily., Disp: , Rfl:    cholecalciferol  (VITAMIN D ) 1000 units tablet, Take 2,000 Units by mouth daily., Disp: , Rfl:    clopidogrel  (PLAVIX ) 75 MG tablet, Take 1 tablet (75 mg total) by mouth daily., Disp: 90 tablet, Rfl: 0   cyanocobalamin  (VITAMIN B12) 1000 MCG tablet, Take 1,000 mcg by mouth daily., Disp: , Rfl:    docusate sodium  (COLACE) 100 MG capsule, Take 100 mg by mouth 2 (two) times daily., Disp: , Rfl:    empagliflozin (JARDIANCE) 25 MG TABS tablet, Take 12.5 mg by mouth., Disp: , Rfl:    ezetimibe  (ZETIA ) 10 MG tablet, Take 1 tablet (10 mg total) by mouth daily., Disp: 90 tablet, Rfl: 3   FLAXSEED, LINSEED, PO, Take 2 g by mouth daily., Disp: , Rfl:    furosemide  (LASIX ) 20 MG tablet, Take 20 mg by mouth as needed., Disp: , Rfl:    metoprolol  succinate (TOPROL -XL) 25 MG 24 hr tablet, Take 12.5 mg by mouth daily., Disp: , Rfl:    nitroGLYCERIN  (NITROSTAT ) 0.4 MG SL tablet, Place  1 tablet (0.4 mg total) under the tongue every 5 (five) minutes x 3 doses as needed for chest pain., Disp: 25 tablet, Rfl: 0   pantoprazole  (PROTONIX ) 40 MG tablet, Take 1 tablet (40 mg total) by mouth 2 (two) times daily., Disp: 60 tablet, Rfl: 1   potassium chloride  (KLOR-CON ) 10 MEQ tablet, Take 10 mEq by mouth as needed., Disp: , Rfl:    pregabalin  (LYRICA ) 100 MG capsule, Take 100 mg by mouth in the morning. (Patient not taking: Reported on 02/17/2024), Disp: , Rfl:    pregabalin  (LYRICA ) 150 MG capsule, Take 150 mg by mouth at bedtime., Disp: , Rfl:    Probiotic Product (UP4 PROBIOTICS PO), Take 7.5 mg by mouth., Disp: , Rfl:    Psyllium (GERI-MUCIL) 25 % POWD, Take by mouth., Disp: , Rfl:    ranolazine  (RANEXA ) 500 MG 12 hr tablet, Take 1 tablet (500 mg total) by mouth 2 (two) times daily., Disp: 180 tablet, Rfl: 3   tamsulosin  (FLOMAX ) 0.4 MG CAPS capsule, Take 2 capsules (0.8 mg total) by mouth daily., Disp: 30 capsule, Rfl: 0   zinc  sulfate 220 (50 Zn) MG capsule, Take 1 tablet by mouth daily., Disp: , Rfl:   Past Medical History: Past Medical History:  Diagnosis Date   Acute embolic stroke (HCC) 02/01/2017   BPH (  benign prostatic hyperplasia)    Bradycardia 11/10/2015   Coronary artery disease    Dizziness 08/21/2012   DYSPEPSIA 07/31/2010   Qualifier: Diagnosis of  By: Hildy Lowers, RN, Ashley     Hyperlipidemia 05/16/2010   Qualifier: Diagnosis of  By: Jerelene Monday MD, [name]      Hypertension    HYPERTENSION, BENIGN 05/16/2010   Qualifier: Diagnosis of  By: Jerelene Monday MD, [name]     Stenosis of right carotid artery 02/01/2017    Tobacco Use: Social History   Tobacco Use  Smoking Status Never  Smokeless Tobacco Never    Labs: Review Flowsheet       Latest Ref Rng & Units 01/23/2010 06/15/2023 06/16/2023  Labs for ITP Cardiac and Pulmonary Rehab  Cholestrol 0 - 200 mg/dL 161  - 90   LDL (calc) 0 - 99 mg/dL 09.6  - 44   HDL-C >04 mg/dL 54.0  - 40   Trlycerides <150 mg/dL 93  - 30    Hemoglobin A1c 4.8 - 5.6 % 5.4  6.3  6.3  -    Details       Multiple values from one day are sorted in reverse-chronological order          Exercise Target Goals: Exercise Program Goal: Individual exercise prescription set using results from initial 6 min walk test and THRR while considering  patient's activity barriers and safety.   Exercise Prescription Goal: Initial exercise prescription builds to 30-45 minutes a day of aerobic activity, 2-3 days per week.  Home exercise guidelines will be given to patient during program as part of exercise prescription that the participant will acknowledge.   Education: Aerobic Exercise: - Group verbal and visual presentation on the components of exercise prescription. Introduces F.I.T.T principle from ACSM for exercise prescriptions.  Reviews F.I.T.T. principles of aerobic exercise including progression. Written material given at graduation. Flowsheet Row Cardiac Rehab from 12/18/2023 in [facility] Cardiac and Pulmonary Rehab  Education need identified 12/18/23       Education: Resistance Exercise: - Group verbal and visual presentation on the components of exercise prescription. Introduces F.I.T.T principle from ACSM for exercise prescriptions  Reviews F.I.T.T. principles of resistance exercise including progression. Written material given at graduation.    Education: Exercise & Equipment Safety: - Individual verbal instruction and demonstration of equipment use and safety with use of the equipment. Flowsheet Row Cardiac Rehab from 12/18/2023 in [facility] Cardiac and Pulmonary Rehab  Date 12/18/23  Educator MB  Instruction Review Code 1- Verbalizes Understanding       Education: Exercise Physiology & General Exercise Guidelines: - Group verbal and written instruction with models to review the exercise physiology of the cardiovascular system and associated critical values. Provides general exercise guidelines with specific guidelines to those  with heart or lung disease.  Flowsheet Row Cardiac Rehab from 12/18/2023 in [facility] Cardiac and Pulmonary Rehab  Education need identified 12/18/23       Education: Flexibility, Balance, Mind/Body Relaxation: - Group verbal and visual presentation with interactive activity on the components of exercise prescription. Introduces F.I.T.T principle from ACSM for exercise prescriptions. Reviews F.I.T.T. principles of flexibility and balance exercise training including progression. Also discusses the mind body connection.  Reviews various relaxation techniques to help reduce and manage stress (i.e. Deep breathing, progressive muscle relaxation, and visualization). Balance handout provided to take home. Written material given at graduation.   Activity Barriers & Risk Stratification:  Activity Barriers & Cardiac Risk Stratification - 12/18/23 1503  Activity Barriers & Cardiac Risk Stratification   Activity Barriers Arthritis;Back Problems;Balance Concerns;Muscular Weakness    Cardiac Risk Stratification High             6 Minute Walk:  6 Minute Walk     Row Name 12/18/23 1501 04/22/24 1125       6 Minute Walk   Phase Initial Discharge    Distance 805 feet 750 feet    Distance % Change -- -6.8 %    Distance Feet Change -- -55 ft    Walk Time 6 minutes 6 minutes    # of Rest Breaks 0 0    MPH 1.52 1.4    METS 1.16 1    RPE 11 13    Perceived Dyspnea  0 1    VO2 Peak 4.06 3.19    Symptoms No No    Resting HR 64 bpm 71 bpm    Resting BP 146/86 116/62    Resting Oxygen Saturation  97 % 97 %    Exercise Oxygen Saturation  during 6 min walk 96 % 95 %    Max Ex. HR 90 bpm 84 bpm    Max Ex. BP 130/60 132/72    2 Minute Post BP 120/64 --             Oxygen Initial Assessment:   Oxygen Re-Evaluation:   Oxygen Discharge (Final Oxygen Re-Evaluation):   Initial Exercise Prescription:  Initial Exercise Prescription - 12/18/23 1500       Date of Initial Exercise RX and  Referring Provider   Date 12/18/23    Referring Provider Shary Deems, MD      Oxygen   Maintain Oxygen Saturation 88% or higher      Recumbant Bike   Level 1    RPM 50    Watts 15    Minutes 15    METs 1.16      NuStep   Level 1    SPM 80    Minutes 15    METs 1.16      Biostep-RELP   Level 1    Minutes 15    METs 1.16      Track   Laps 21    Minutes 15    METs 1.16      Prescription Details   Frequency (times per week) 2    Duration Progress to 30 minutes of continuous aerobic without signs/symptoms of physical distress      Intensity   THRR 40-80% of Max Heartrate 90-118    Ratings of Perceived Exertion 11-13    Perceived Dyspnea 0-4      Progression   Progression Continue to progress workloads to maintain intensity without signs/symptoms of physical distress.      Resistance Training   Training Prescription Yes    Weight 2lb    Reps 10-15             Perform Capillary Blood Glucose checks as needed.  Exercise Prescription Changes:   Exercise Prescription Changes     Row Name 12/18/23 1500 01/07/24 1500 01/23/24 1600 02/04/24 1500 02/17/24 1100     Response to Exercise   Blood Pressure (Admit) 146/86 124/70 122/64 120/64 --   Blood Pressure (Exercise) 130/60 126/70 130/80 118/62 --   Blood Pressure (Exit) 120/64 124/70 110/72 118/60 --   Heart Rate (Admit) 64 bpm 66 bpm 69 bpm 75 bpm --   Heart Rate (Exercise) 90 bpm 101 bpm 100 bpm 95 bpm --  Heart Rate (Exit) 62 bpm 73 bpm 67 bpm 78 bpm --   Oxygen Saturation (Admit) 97 % -- -- -- --   Oxygen Saturation (Exercise) 96 % -- -- -- --   Oxygen Saturation (Exit) 96 % -- -- -- --   Rating of Perceived Exertion (Exercise) 11 13 13 14  --   Perceived Dyspnea (Exercise) 0 0 0 -- --   Symptoms none none none none --   Comments results First 2 weeks of exercise -- -- --   Duration Progress to 30 minutes of  aerobic without signs/symptoms of physical distress Progress to 30 minutes of   aerobic without signs/symptoms of physical distress Progress to 30 minutes of  aerobic without signs/symptoms of physical distress Continue with 30 min of aerobic exercise without signs/symptoms of physical distress. Continue with 30 min of aerobic exercise without signs/symptoms of physical distress.   Intensity THRR New THRR New THRR New THRR unchanged THRR unchanged     Progression   Progression Continue to progress workloads to maintain intensity without signs/symptoms of physical distress. Continue to progress workloads to maintain intensity without signs/symptoms of physical distress. Continue to progress workloads to maintain intensity without signs/symptoms of physical distress. Continue to progress workloads to maintain intensity without signs/symptoms of physical distress. Continue to progress workloads to maintain intensity without signs/symptoms of physical distress.   Average METs 1.16 1.96 1.96 1.8 1.8     Resistance Training   Training Prescription -- Yes Yes Yes Yes   Weight -- 2lb 2lb 2lb 2lb   Reps -- 10-15 10-15 10-15 10-15     Interval Training   Interval Training -- No No No No     NuStep   Level -- 2  T6 3 2 2    Minutes -- 15 15 15 15    METs -- 1.9 1.8 1.36 1.36     T5 Nustep   Level -- -- 2  T6 2  T6 2  T6   Minutes -- -- 15 15 15    METs -- -- 1.6 2 2      Biostep-RELP   Level -- 1 -- -- --   Minutes -- 15 -- -- --   METs -- 2 -- -- --     Track   Laps -- 21 21 21 21    Minutes -- 15 15 15 15    METs -- 2.14 2.14 2.14 2.14     Home Exercise Plan   Plans to continue exercise at -- -- -- -- Home (comment)  walking   Frequency -- -- -- -- Add 2 additional days to program exercise sessions.   Initial Home Exercises Provided -- -- -- -- 02/17/24     Oxygen   Maintain Oxygen Saturation -- 88% or higher 88% or higher 88% or higher 88% or higher    Row Name 02/17/24 1400 03/05/24 1600 03/20/24 0700 04/02/24 1100 04/16/24 1500     Response to Exercise   Blood  Pressure (Admit) 122/70 122/82 112/58 114/62 114/68   Blood Pressure (Exercise) 140/80 124/68 -- -- --   Blood Pressure (Exit) 122/68 114/66 126/60 142/64 126/72   Heart Rate (Admit) 77 bpm 67 bpm 66 bpm 76 bpm 76 bpm   Heart Rate (Exercise) 98 bpm 95 bpm 103 bpm 103 bpm 97 bpm   Heart Rate (Exit) 74 bpm 74 bpm 74 bpm 87 bpm 75 bpm   Rating of Perceived Exertion (Exercise) 13 13 15 15 13    Symptoms none none none  none none   Duration Continue with 30 min of aerobic exercise without signs/symptoms of physical distress. Continue with 30 min of aerobic exercise without signs/symptoms of physical distress. Continue with 30 min of aerobic exercise without signs/symptoms of physical distress. Continue with 30 min of aerobic exercise without signs/symptoms of physical distress. Continue with 30 min of aerobic exercise without signs/symptoms of physical distress.   Intensity THRR unchanged THRR unchanged THRR unchanged THRR unchanged THRR unchanged     Progression   Progression Continue to progress workloads to maintain intensity without signs/symptoms of physical distress. Continue to progress workloads to maintain intensity without signs/symptoms of physical distress. Continue to progress workloads to maintain intensity without signs/symptoms of physical distress. Continue to progress workloads to maintain intensity without signs/symptoms of physical distress. Continue to progress workloads to maintain intensity without signs/symptoms of physical distress.   Average METs 1.86 2.34 2.21 2.09 2.14     Resistance Training   Training Prescription Yes Yes Yes Yes Yes   Weight 2lb 2lb 2 lb 2 lb 2 lb   Reps 10-15 10-15 10-15 10-15 10-15     Interval Training   Interval Training No No No No No     NuStep   Level -- 1 1  T6 nustep 1  T6 nustep 2  T6 nustep   Minutes -- 15 15 15 15    METs -- 2.4 3.1 2.5 1.9     T5 Nustep   Level 2  T6 -- -- -- --   Minutes 15 -- -- -- --   METs 1.9 -- -- -- --      Track   Laps 20 35 22 17 17    Minutes 15 30 15 15 15    METs 2.09 2.9 2.2 1.92 1.92     Home Exercise Plan   Plans to continue exercise at Home (comment)  walking Home (comment)  walking Home (comment)  walking Home (comment)  walking Home (comment)  walking   Frequency Add 2 additional days to program exercise sessions. Add 2 additional days to program exercise sessions. Add 2 additional days to program exercise sessions. Add 2 additional days to program exercise sessions. Add 2 additional days to program exercise sessions.   Initial Home Exercises Provided 02/17/24 02/17/24 02/17/24 02/17/24 02/17/24     Oxygen   Maintain Oxygen Saturation 88% or higher 88% or higher 88% or higher 88% or higher 88% or higher    Row Name 04/28/24 1300             Response to Exercise   Blood Pressure (Admit) 132/72       Blood Pressure (Exit) 122/68       Heart Rate (Admit) 70 bpm       Heart Rate (Exercise) 106 bpm       Heart Rate (Exit) 73 bpm       Rating of Perceived Exertion (Exercise) 13       Symptoms none       Duration Continue with 30 min of aerobic exercise without signs/symptoms of physical distress.       Intensity THRR unchanged         Progression   Progression Continue to progress workloads to maintain intensity without signs/symptoms of physical distress.       Average METs 2.21         Resistance Training   Training Prescription Yes       Weight 2 lb       Reps  10-15         Interval Training   Interval Training No         NuStep   Level 2  T6 nustep       Minutes 15       METs 2         Track   Laps 20       Minutes 15       METs 2.09         Home Exercise Plan   Plans to continue exercise at Home (comment)  walking       Frequency Add 2 additional days to program exercise sessions.       Initial Home Exercises Provided 02/17/24         Oxygen   Maintain Oxygen Saturation 88% or higher                Exercise Comments:   Exercise Comments      Row Name 12/23/23 1128           Exercise Comments First full day of exercise!  Patient was oriented to gym and equipment including functions, settings, policies, and procedures.  Patient's individual exercise prescription and treatment plan were reviewed.  All starting workloads were established based on the results of the 6 minute walk test done at initial orientation visit.  The plan for exercise progression was also introduced and progression will be customized based on patient's performance and goals.                Exercise Goals and Review:   Exercise Goals     Row Name 12/18/23 1507             Exercise Goals   Increase Physical Activity Yes       Intervention Provide advice, education, support and counseling about physical activity/exercise needs.;Develop an individualized exercise prescription for aerobic and resistive training based on initial evaluation findings, risk stratification, comorbidities and participant's personal goals.       Expected Outcomes Short Term: Attend rehab on a regular basis to increase amount of physical activity.;Long Term: Add in home exercise to make exercise part of routine and to increase amount of physical activity.;Long Term: Exercising regularly at least 3-5 days a week.       Increase Strength and Stamina Yes       Intervention Provide advice, education, support and counseling about physical activity/exercise needs.;Develop an individualized exercise prescription for aerobic and resistive training based on initial evaluation findings, risk stratification, comorbidities and participant's personal goals.       Expected Outcomes Short Term: Increase workloads from initial exercise prescription for resistance, speed, and METs.;Short Term: Perform resistance training exercises routinely during rehab and add in resistance training at home;Long Term: Improve cardiorespiratory fitness, muscular endurance and strength as measured by increased METs and  functional capacity ( )       Able to understand and use rate of perceived exertion (RPE) scale Yes       Intervention Provide education and explanation on how to use RPE scale       Expected Outcomes Short Term: Able to use RPE daily in rehab to express subjective intensity level;Long Term:  Able to use RPE to guide intensity level when exercising independently       Able to understand and use Dyspnea scale Yes       Intervention Provide education and explanation on how to use Dyspnea scale  Expected Outcomes Short Term: Able to use Dyspnea scale daily in rehab to express subjective sense of shortness of breath during exertion;Long Term: Able to use Dyspnea scale to guide intensity level when exercising independently       Knowledge and understanding of Target Heart Rate Range (THRR) Yes       Intervention Provide education and explanation of THRR including how the numbers were predicted and where they are located for reference       Expected Outcomes Short Term: Able to state/look up THRR;Short Term: Able to use daily as guideline for intensity in rehab;Long Term: Able to use THRR to govern intensity when exercising independently       Able to check pulse independently Yes       Intervention Provide education and demonstration on how to check pulse in carotid and radial arteries.;Review the importance of being able to check your own pulse for safety during independent exercise       Expected Outcomes Short Term: Able to explain why pulse checking is important during independent exercise;Long Term: Able to check pulse independently and accurately       Understanding of Exercise Prescription Yes       Intervention Provide education, explanation, and written materials on patient's individual exercise prescription       Expected Outcomes Short Term: Able to explain program exercise prescription;Long Term: Able to explain home exercise prescription to exercise independently                 Exercise Goals Re-Evaluation :  Exercise Goals Re-Evaluation     Row Name 12/23/23 1129 01/07/24 1528 01/23/24 1654 02/04/24 1526 02/17/24 1145     Exercise Goal Re-Evaluation   Exercise Goals Review Increase Physical Activity;Able to understand and use rate of perceived exertion (RPE) scale;Knowledge and understanding of Target Heart Rate Range (THRR);Understanding of Exercise Prescription;Increase Strength and Stamina;Able to check pulse independently Increase Physical Activity;Increase Strength and Stamina;Understanding of Exercise Prescription Increase Physical Activity;Increase Strength and Stamina;Understanding of Exercise Prescription Increase Physical Activity;Increase Strength and Stamina;Understanding of Exercise Prescription Increase Physical Activity;Able to understand and use rate of perceived exertion (RPE) scale;Knowledge and understanding of Target Heart Rate Range (THRR);Understanding of Exercise Prescription;Increase Strength and Stamina;Able to check pulse independently   Comments Reviewed RPE and dyspnea scale, THR and program prescription with pt today.  Pt voiced understanding and was given a copy of goals to take home. Awad is off to a good start in the program. He was able to increase his level on the T6 nustep to level 2. He was also able to maintain his track laps, and level on the biostep. We will continue to monitor his progress in the program. Savvas is doing well in rehab. He was recently able to increase his level on the T4 nustep from 2 to 3. He was also able to maintain 21 laps on the track in 15 minutes. We will continue to monitor his progress in the program. Bush is doing well in rehab. He was able to maintain 21 laps on the track and level 2 on the T6 nustep. We will continue to monitor his progress in the program. Reviewed home exercise with pt today.  Pt plans to walk at home for exercise.  Reviewed THR, pulse, RPE, sign and symptoms, pulse oximetery and when  to call 911 or MD.  Also discussed weather considerations and indoor options.  Pt voiced understanding.   Expected Outcomes Short: Use RPE daily to regulate intensity.  Long: Follow program prescription in THR. Short: Continue to increase level on the T6 nustep, and push for more laps on the track. Long: Continue exercise to improve strength and stamina. Short: Continue to increase level on the T6 nustep, and push for more laps on the track. Long: Continue exercise to improve strength and stamina. Short: Continue to push for more laps on the track. Long: Continue exercise to improve strength and stamina. Short: walk at home 1-2 days a week on off days of rehab, following recommendations provided by staff in home exercise review. Long: maintain independent exercise routine upon graduation from cardiac rehab.    Row Name 02/17/24 1455 03/05/24 1609 03/20/24 0754 04/01/24 1125 04/02/24 1113     Exercise Goal Re-Evaluation   Exercise Goals Review Increase Physical Activity;Understanding of Exercise Prescription;Increase Strength and Stamina Increase Physical Activity;Understanding of Exercise Prescription;Increase Strength and Stamina Increase Physical Activity;Understanding of Exercise Prescription;Increase Strength and Stamina Increase Physical Activity;Understanding of Exercise Prescription;Increase Strength and Stamina Increase Physical Activity;Understanding of Exercise Prescription;Increase Strength and Stamina   Comments Estus is doing well in rehab. He walked 20 laps on the track and was able to maintain level 2 on the T6. We will continue to monitor his progress in the program. Jule continues to do well in rehab. He was able to maintain a level 1 on the T6 nustep. He was also able to increase from 20 to 35 laps on the track. We will continue to monitor his progress in the program. Zade continues to do well in rehab. He was able to maintain a level 1 on the T6 nustep. He was also able to increase  from 20 to 22 laps on the track when walking for 15 minutes. We will continue to monitor his progress in the program. Zaiyden is doing well and continues to improve. He states that he continues to do exercise at home, which consists of walking outside. We will continue to monitor his progress in the program. Willson has only attended two sessions since the last review. He has continued to work at level 1 on the T6 nustep. He also saw a decrease in his laps walked on the track from 22 to 17 laps. We will continue to monitor his progress in the program.   Expected Outcomes Short: Push for more laps on the track and try level 3 on the T6 nustep. Long: Continue exercise to improve strength and stamina. Short: Continue to increase track laps, return to level 2 on the T6 nustep. Long: Continue exercise to improve strength and stamina. Short: Increase to level 2 on the T6 nustep. Long: Continue exercise to improve strength and stamina. Short: Continue to exercise at home. Long: Continue exercise to improve strength and stamina. Short: Attend rehab more consistently. Long: Continue exercise to improve strength and stamina.    Row Name 04/16/24 1535 04/28/24 1349 05/04/24 1119         Exercise Goal Re-Evaluation   Exercise Goals Review Increase Physical Activity;Understanding of Exercise Prescription;Increase Strength and Stamina Increase Physical Activity;Understanding of Exercise Prescription;Increase Strength and Stamina Increase Physical Activity;Increase Strength and Stamina;Understanding of Exercise Prescription     Comments Cadin continues to do well in rehab. He improved to  level 2 on the T6 nustep. He also continues to walk 17 laps on the track after previously walking 22 laps. We will continue to monitor his progress in the program. Chirstopher continues to do well in rehab. He recently completed his post . He continues to work  at level 2 on the T6 nustep. He also increased back up to 20 laps on the track  after previously walking only 17 laps. We will continue to monitor his progress until he graduates from the program. Noelle is walking at home most every day. He walks up and down his hallway as he can tolerate.     Expected Outcomes Short: Increase laps on the track back up to 22 laps. Long: Continue exercise to improve strength and stamina. Short: Graduate. Long: Continue to exercise independently. Short: continue to make an effort to be active at home.Long: maintain exercise routine upon graduation from program.              Discharge Exercise Prescription (Final Exercise Prescription Changes):  Exercise Prescription Changes - 04/28/24 1300       Response to Exercise   Blood Pressure (Admit) 132/72    Blood Pressure (Exit) 122/68    Heart Rate (Admit) 70 bpm    Heart Rate (Exercise) 106 bpm    Heart Rate (Exit) 73 bpm    Rating of Perceived Exertion (Exercise) 13    Symptoms none    Duration Continue with 30 min of aerobic exercise without signs/symptoms of physical distress.    Intensity THRR unchanged      Progression   Progression Continue to progress workloads to maintain intensity without signs/symptoms of physical distress.    Average METs 2.21      Resistance Training   Training Prescription Yes    Weight 2 lb    Reps 10-15      Interval Training   Interval Training No      NuStep   Level 2   T6 nustep   Minutes 15    METs 2      Track   Laps 20    Minutes 15    METs 2.09      Home Exercise Plan   Plans to continue exercise at Home (comment)   walking   Frequency Add 2 additional days to program exercise sessions.    Initial Home Exercises Provided 02/17/24      Oxygen   Maintain Oxygen Saturation 88% or higher             Nutrition:  Target Goals: Understanding of nutrition guidelines, daily intake of sodium 1500mg , cholesterol 200mg , calories 30% from fat and 7% or less from saturated fats, daily to have 5 or more servings of fruits and  vegetables.  Education: All About Nutrition: -Group instruction provided by verbal, written material, interactive activities, discussions, models, and posters to present general guidelines for heart healthy nutrition including fat, fiber, MyPlate, the role of sodium in heart healthy nutrition, utilization of the nutrition label, and utilization of this knowledge for meal planning. Follow up email sent as well. Written material given at graduation. Flowsheet Row Cardiac Rehab from 12/18/2023 in [facility] Cardiac and Pulmonary Rehab  Education need identified 12/18/23       Biometrics:  Pre Biometrics - 12/18/23 1508       Pre Biometrics   Height 5' 8.4" (1.737 m)    Weight 174 lb 1.6 oz (79 kg)    Waist Circumference 42.5 inches    Hip Circumference 38.5 inches    Waist to Hip Ratio 1.1 %    BMI (Calculated) 26.17    Single Leg Stand 0.5 seconds             Post Biometrics - 04/22/24 1127  Post  Biometrics   Height 5' 8.4" (1.737 m)    Weight 174 lb 1.6 oz (79 kg)    Waist Circumference 41.5 inches    Hip Circumference 40 inches    Waist to Hip Ratio 1.04 %    BMI (Calculated) 26.17    Single Leg Stand 0 seconds             Nutrition Therapy Plan and Nutrition Goals:  Nutrition Therapy & Goals - 12/18/23 1509       Nutrition Therapy   RD appointment deferred Yes      Personal Nutrition Goals   Nutrition Goal RD appointment deferred      Intervention Plan   Intervention Prescribe, educate and counsel regarding individualized specific dietary modifications aiming towards targeted core components such as weight, hypertension, lipid management, diabetes, heart failure and other comorbidities.;Nutrition handout(s) given to patient.    Expected Outcomes Short Term Goal: Understand basic principles of dietary content, such as calories, fat, sodium, cholesterol and nutrients.             Nutrition Assessments:  MEDIFICTS Score Key: >=70 Need to make dietary  changes  40-70 Heart Healthy Diet <= 40 Therapeutic Level Cholesterol Diet  Flowsheet Row Cardiac Rehab from 12/18/2023 in [facility] Cardiac and Pulmonary Rehab  Picture Your Plate Total Score on Admission 67      Picture Your Plate Scores: <29 Unhealthy dietary pattern with much room for improvement. 41-50 Dietary pattern unlikely to meet recommendations for good health and room for improvement. 51-60 More healthful dietary pattern, with some room for improvement.  >60 Healthy dietary pattern, although there may be some specific behaviors that could be improved.    Nutrition Goals Re-Evaluation:  Nutrition Goals Re-Evaluation     Row Name 04/01/24 1116 05/04/24 1127           Goals   Nutrition Goal RD appointment deferred RD appointment deferred               Nutrition Goals Discharge (Final Nutrition Goals Re-Evaluation):  Nutrition Goals Re-Evaluation - 05/04/24 1127       Goals   Nutrition Goal RD appointment deferred             Psychosocial: Target Goals: Acknowledge presence or absence of significant depression and/or stress, maximize coping skills, provide positive support system. Participant is able to verbalize types and ability to use techniques and skills needed for reducing stress and depression.   Education: Stress, Anxiety, and Depression - Group verbal and visual presentation to define topics covered.  Reviews how body is impacted by stress, anxiety, and depression.  Also discusses healthy ways to reduce stress and to treat/manage anxiety and depression.  Written material given at graduation. Flowsheet Row Cardiac Rehab from 07/15/2023 in [facility] Cardiac and Pulmonary Rehab  Education need identified 07/15/23       Education: Sleep Hygiene -Provides group verbal and written instruction about how sleep can affect your health.  Define sleep hygiene, discuss sleep cycles and impact of sleep habits. Review good sleep hygiene tips.    Initial Review &  Psychosocial Screening:  Initial Psych Review & Screening - 12/17/23 1305       Initial Review   Current issues with Current Stress Concerns    Source of Stress Concerns Unable to participate in former interests or hobbies;Unable to perform yard/household activities      Family Dynamics   Good Support System? Yes   wife and family  Barriers   Psychosocial barriers to participate in program There are no identifiable barriers or psychosocial needs.;The patient should benefit from training in stress management and relaxation.      Screening Interventions   Interventions Encouraged to exercise;Provide feedback about the scores to participant;To provide support and resources with identified psychosocial needs    Expected Outcomes Short Term goal: Utilizing psychosocial counselor, staff and physician to assist with identification of specific Stressors or current issues interfering with healing process. Setting desired goal for each stressor or current issue identified.;Long Term Goal: Stressors or current issues are controlled or eliminated.;Short Term goal: Identification and review with participant of any Quality of Life or Depression concerns found by scoring the questionnaire.;Long Term goal: The participant improves quality of Life and PHQ9 Scores as seen by post scores and/or verbalization of changes             Quality of Life Scores:   Quality of Life - 12/18/23 1509       Quality of Life Scores   Health/Function Pre 25 %    Socioeconomic Pre 27.92 %    Psych/Spiritual Pre 28 %    Family Pre 30 %    GLOBAL Pre 27.09 %            Scores of 19 and below usually indicate a poorer quality of life in these areas.  A difference of  2-3 points is a clinically meaningful difference.  A difference of 2-3 points in the total score of the Quality of Life Index has been associated with significant improvement in overall quality of life, self-image, physical symptoms, and general  health in studies assessing change in quality of life.  PHQ-9: Review Flowsheet       05/04/2024 12/18/2023 07/15/2023  Depression screen PHQ 2/9  Decreased Interest 0 0 0  Down, Depressed, Hopeless 0 0 0  PHQ - 2 Score 0 0 0  Altered sleeping 0 0 0  Tired, decreased energy 0 1 2  Change in appetite 0 0 1  Feeling bad or failure about yourself  0 0 0  Trouble concentrating 0 2 3  Moving slowly or fidgety/restless 0 3 0  Suicidal thoughts 0 0 0  PHQ-9 Score 0 6 6  Difficult doing work/chores - Extremely dIfficult Somewhat difficult   Interpretation of Total Score  Total Score Depression Severity:  1-4 = Minimal depression, 5-9 = Mild depression, 10-14 = Moderate depression, 15-19 = Moderately severe depression, 20-27 = Severe depression   Psychosocial Evaluation and Intervention:  Psychosocial Evaluation - 12/17/23 1314       Psychosocial Evaluation & Interventions   Comments 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  He had a stroke in the past that caused difficulty in word processing, so his wife mainly spoke on the phone during the orientation phone call. 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 He has been walking more and has more energy. When asked about stress, it was mentioned that he really wants to get back to what he wants to do. His wife helps him when needed and notes that he really enjoyed cardiac rehab in the past so they both think this will help him get closer to where he wants to be, He has a good support system. He reports no sleep concerns or concerns with anxiety or depression.    Expected Outcomes Short: attend cardiac rehab for education and  exercise. Long: devleop and maintain positive self care habits    Continue Psychosocial Services  Follow up required by staff             Psychosocial Re-Evaluation:  Psychosocial Re-Evaluation     Row Name 04/01/24 1112 05/04/24 1128            Psychosocial Re-Evaluation   Current issues with None Identified None Identified      Comments Danyal states that he does not have any stress at this time. He has a good support system made up of his two sons and his wife. He reports no sleep concerns at this time. PHQ depresison screening questionaire was re-done. Patient's score went from a 6 to a 0. He states he is doing ok mentally, with his only concern not being able to do all the things he used to be able to do.      Expected Outcomes Short: Continue to attend cardiac rehab for stress relief. Long: Continue to maintain positive outlook. Short: continue to attend cardiac rehab  for mental health benefits. Long: maintain good mental health routine.      Interventions Encouraged to attend Cardiac Rehabilitation for the exercise --      Continue Psychosocial Services  Follow up required by staff Follow up required by staff               Psychosocial Discharge (Final Psychosocial Re-Evaluation):  Psychosocial Re-Evaluation - 05/04/24 1128       Psychosocial Re-Evaluation   Current issues with None Identified    Comments PHQ depresison screening questionaire was re-done. Patient's score went from a 6 to a 0. He states he is doing ok mentally, with his only concern not being able to do all the things he used to be able to do.    Expected Outcomes Short: continue to attend cardiac rehab  for mental health benefits. Long: maintain good mental health routine.    Continue Psychosocial Services  Follow up required by staff             Vocational Rehabilitation: Provide vocational rehab assistance to qualifying candidates.   Vocational Rehab Evaluation & Intervention:   Education: Education Goals: Education classes will be provided on a variety of topics geared toward better understanding of heart health and risk factor modification. Participant will state understanding/return demonstration of topics presented as noted by  education test scores.  Learning Barriers/Preferences:   General Cardiac Education Topics:  AED/CPR: - Group verbal and written instruction with the use of models to demonstrate the basic use of the AED with the basic ABC's of resuscitation.   Anatomy and Cardiac Procedures: - Group verbal and visual presentation and models provide information about basic cardiac anatomy and function. Reviews the testing methods done to diagnose heart disease and the outcomes of the test results. Describes the treatment choices: Medical Management, Angioplasty, or Coronary Bypass Surgery for treating various heart conditions including Myocardial Infarction, Angina, Valve Disease, and Cardiac Arrhythmias.  Written material given at graduation. Flowsheet Row Cardiac Rehab from 12/18/2023 in [facility] Cardiac and Pulmonary Rehab  Education need identified 12/18/23       Medication Safety: - Group verbal and visual instruction to review commonly prescribed medications for heart and lung disease. Reviews the medication, class of the drug, and side effects. Includes the steps to properly store meds and maintain the prescription regimen.  Written material given at graduation.   Intimacy: - Group verbal instruction through game format to discuss how  heart and lung disease can affect sexual intimacy. Written material given at graduation.. Flowsheet Row Cardiac Rehab from 12/18/2023 in [facility] Cardiac and Pulmonary Rehab  Education need identified 12/18/23       Know Your Numbers and Heart Failure: - Group verbal and visual instruction to discuss disease risk factors for cardiac and pulmonary disease and treatment options.  Reviews associated critical values for Overweight/Obesity, Hypertension, Cholesterol, and Diabetes.  Discusses basics of heart failure: signs/symptoms and treatments.  Introduces Heart Failure Zone chart for action plan for heart failure.  Written material given at graduation.   Infection  Prevention: - Provides verbal and written material to individual with discussion of infection control including proper hand washing and proper equipment cleaning during exercise session. Flowsheet Row Cardiac Rehab from 12/18/2023 in [facility] Cardiac and Pulmonary Rehab  Date 12/18/23  Educator MB  Instruction Review Code 1- Verbalizes Understanding       Falls Prevention: - Provides verbal and written material to individual with discussion of falls prevention and safety. Flowsheet Row Cardiac Rehab from 12/18/2023 in [facility] Cardiac and Pulmonary Rehab  Date 12/18/23  Educator MB  Instruction Review Code 1- Verbalizes Understanding       Other: -Provides group and verbal instruction on various topics (see comments)   Knowledge Questionnaire Score:  Knowledge Questionnaire Score - 12/18/23 1511       Knowledge Questionnaire Score   Pre Score 20/26             Core Components/Risk Factors/Patient Goals at Admission:  Personal Goals and Risk Factors at Admission - 12/18/23 1511       Core Components/Risk Factors/Patient Goals on Admission    Weight Management Yes;Weight Maintenance    Intervention Weight Management: Develop a combined nutrition and exercise program designed to reach desired caloric intake, while maintaining appropriate intake of nutrient and fiber, sodium and fats, and appropriate energy expenditure required for the weight goal.;Weight Management: Provide education and appropriate resources to help participant work on and attain dietary goals.;Weight Management/Obesity: Establish reasonable short term and long term weight goals.    Admit Weight 174 lb 1.6 oz (79 kg)    Goal Weight: Short Term 174 lb (78.9 kg)    Goal Weight: Long Term 174 lb (78.9 kg)    Expected Outcomes Short Term: Continue to assess and modify interventions until short term weight is achieved;Weight Maintenance: Understanding of the daily nutrition guidelines, which includes 25-35% calories  from fat, 7% or less cal from saturated fats, less than 200mg  cholesterol, less than 1.5gm of sodium, & 5 or more servings of fruits and vegetables daily;Understanding recommendations for meals to include 15-35% energy as protein, 25-35% energy from fat, 35-60% energy from carbohydrates, less than 200mg  of dietary cholesterol, 20-35 gm of total fiber daily;Understanding of distribution of calorie intake throughout the day with the consumption of 4-5 meals/snacks;Long Term: Adherence to nutrition and physical activity/exercise program aimed toward attainment of established weight goal    Heart Failure Yes    Intervention Provide a combined exercise and nutrition program that is supplemented with education, support and counseling about heart failure. Directed toward relieving symptoms such as shortness of breath, decreased exercise tolerance, and extremity edema.    Expected Outcomes Improve functional capacity of life;Short term: Attendance in program 2-3 days a week with increased exercise capacity. Reported lower sodium intake. Reported increased fruit and vegetable intake. Reports medication compliance.;Long term: Adoption of self-care skills and reduction of barriers for early signs and symptoms recognition and  intervention leading to self-care maintenance.;Short term: Daily weights obtained and reported for increase. Utilizing diuretic protocols set by physician.    Hypertension Yes    Intervention Provide education on lifestyle modifcations including regular physical activity/exercise, weight management, moderate sodium restriction and increased consumption of fresh fruit, vegetables, and low fat dairy, alcohol moderation, and smoking cessation.;Monitor prescription use compliance.    Expected Outcomes Short Term: Continued assessment and intervention until BP is < 140/3mm HG in hypertensive participants. < 130/51mm HG in hypertensive participants with diabetes, heart failure or chronic kidney  disease.;Long Term: Maintenance of blood pressure at goal levels.    Lipids Yes    Intervention Provide education and support for participant on nutrition & aerobic/resistive exercise along with prescribed medications to achieve LDL 70mg , HDL >40mg .    Expected Outcomes Short Term: Participant states understanding of desired cholesterol values and is compliant with medications prescribed. Participant is following exercise prescription and nutrition guidelines.;Long Term: Cholesterol controlled with medications as prescribed, with individualized exercise RX and with personalized nutrition plan. Value goals: LDL < 70mg , HDL > 40 mg.             Education:Diabetes - Individual verbal and written instruction to review signs/symptoms of diabetes, desired ranges of glucose level fasting, after meals and with exercise. Acknowledge that pre and post exercise glucose checks will be done for 3 sessions at entry of program.   Core Components/Risk Factors/Patient Goals Review:   Goals and Risk Factor Review     Row Name 04/01/24 1118 05/04/24 1130           Core Components/Risk Factors/Patient Goals Review   Personal Goals Review Weight Management/Obesity;Hypertension Hypertension      Review Shota states that he does not take his BP at home very frequently. His BP has been within a healthy range on program days, today he measured 118/68. Danon is still maintaining his weight, he state that his wife manages his diet. [place]ide was asked if he was checking his BP at home again. He does not recall doing so. He was encouraged to start doing so.      Expected Outcomes Short: Check BP at home more regularly. Long: Continue to manage risk factors. Short: Check BP at home more regularly. Long: Continue to manage risk factors.               Core Components/Risk Factors/Patient Goals at Discharge (Final Review):   Goals and Risk Factor Review - 05/04/24 1130       Core Components/Risk  Factors/Patient Goals Review   Personal Goals Review Hypertension    Review Mohamedamin was asked if he was checking his BP at home again. He does not recall doing so. He was encouraged to start doing so.    Expected Outcomes Short: Check BP at home more regularly. Long: Continue to manage risk factors.             ITP Comments:  ITP Comments     Row Name 12/17/23 1320 12/18/23 1501 12/23/23 1128 01/15/24 0727 02/12/24 0757   ITP Comments Initial phone call completed. Diagnosis can be found in [facility] 12/12/23. EP Orientation scheduled for Wednesday 1/15 at 1pm. Completed and gym orientation. Initial ITP created and sent for review to Dr. Firman Hughes, Medical Director. First full day of exercise!  Patient was oriented to gym and equipment including functions, settings, policies, and procedures.  Patient's individual exercise prescription and treatment plan were reviewed.  All starting workloads were established based  on the results of the 6 minute walk test done at initial orientation visit.  The plan for exercise progression was also introduced and progression will be customized based on patient's performance and goals. 30 Day review completed. Medical Director ITP review done, changes made as directed, and signed approval by Medical Director.    new to program 30 Day review completed. Medical Director ITP review done, changes made as directed, and signed approval by Medical Director.    Row Name 03/11/24 0732 04/08/24 0827 05/06/24 0915       ITP Comments 30 Day review completed. Medical Director ITP review done, changes made as directed, and signed approval by Medical Director. 30 Day review completed. Medical Director ITP review done, changes made as directed, and signed approval by Medical Director. 30 Day review completed. Medical Director ITP review done, changes made as directed, and signed approval by Medical Director.              Comments:

## 2024-05-11 ENCOUNTER — Encounter

## 2024-05-13 ENCOUNTER — Encounter

## 2024-05-18 ENCOUNTER — Encounter

## 2024-05-19 ENCOUNTER — Telehealth: Payer: Self-pay | Admitting: Cardiovascular Disease

## 2024-05-19 NOTE — Telephone Encounter (Signed)
 Pt called and message left on VM for callback

## 2024-05-19 NOTE — Telephone Encounter (Signed)
 The patient has called and per wife, Adell Hones reports:  - pt on vacation last week in [place] and went to ED for terrible cough, CXR r/o pneumonia and BNP at 753, bronchitis diagnosed and tessalon Pearls given  - pt (now home) with worsening cough, SOB, and new wheeze, goes to see Dr Agnes Hose PA, new CXR done and still negative for pneumonia, Doxycycline  100 mg BID 7 days given  - Adell Hones reports concern that the coughing is going to affect pt's heart (hx of MI, TAVR, afib). Pt not on diuretics and denies weight change with daily weights, reports some swelling to right ankle, Tessalon Pearls not helping to suppress cough   ED Precautions given and Adell Hones told I would call back this evening with advice

## 2024-05-19 NOTE — Telephone Encounter (Signed)
Patient's wife is returning RN's call. Please advise.

## 2024-05-19 NOTE — Telephone Encounter (Signed)
 Pt c/o Shortness Of Breath: STAT if SOB developed within the last 24 hours or pt is noticeably SOB on the phone  1. Are you currently SOB (can you hear that pt is SOB on the phone)? yes  2. How long have you been experiencing SOB? 4/5 days   3. Are you SOB when sitting or when up moving around? Both   4. Are you currently experiencing any other symptoms? N/A

## 2024-05-20 ENCOUNTER — Encounter

## 2024-05-24 NOTE — Progress Notes (Unsigned)
 Cardiology Office Note  Date:  05/25/2024   ID:  Calvin Camp Bassem Bernasconi., DOB November 24, 1933, MRN [id number]  PCP:  Auston Reyes BIRCH, MD   Chief Complaint  Patient presents with   3 month follow up    Patient c/o bilateral LE edema, no energy, wheezing & shortness of breath, just getting over bronchitis and pneumonia.    HPI:  [name] is a 89 year-old gentleman with a history of  coronary artery disease,   stent placed to his proximal RCA in September 2007, taxis stent 3.0 x 24 mm, previous syncopal episode (Etiology of his spell is uncertain),  50% carotid disease on the right CVA 2018, History of peripheral vision issues  EF 35 to 40% aortic valve stenosis, s/p TAVR temp perm placement for CHB immediately following the procedure, but did not require permanent pacemaker  bleeding ulcer in 2024 Feb with 2 units of transfusion  who presents for routine followup of his coronary artery disease,  Tremor, and stroke, PAF, s/p TAVR 11/24  Last seen by myself in clinic 3/25 He reports recent travel up to [place]  Recent URI/bronchitis, shortness of breath, coughing Given ABX by PMD on his return back to [place]   For at least 1 week maybe 2 weeks he has had severe leg swelling  He called our office last week, for shortness of breath we recommended he start lasix  20 with potassium   On today's visit he continues to have significant pitting leg swelling  EKG showing new atrial flutter Weight is up approximately 6 to 10 pounds from his baseline  Denies PND orthopnea Sometimes will cough until he is dry heaving Though overall cough is slowly improving after antibiotics last week  EKG personally reviewed by myself on todays visit EKG Interpretation Date/Time:  Monday May 25 2024 13:58:55 EDT Ventricular Rate:  63 PR Interval:    QRS Duration:  168 QT Interval:  498 QTC Calculation: 509 R Axis:   -54  Text Interpretation: Atrial flutter Left axis deviation Left  bundle branch block When compared with ECG of 17-Feb-2024 13:59, rhythm has changed Confirmed by Perla Lye [phone]) on 05/25/2024 2:21:33 PM   Two-week holter monitor November 2024 after TAVR discharge demonstrated predominantly sinus rhythm with HR range 41-127, 3 episodes NSVT, ventricular ectopy burden2%, 3% afib burden with no RVR.   ABI and LE arterial doppler normal August 2024  Rare nosebleeds Continues on Eliquis  2.5 twice daily managed by [facility] Creatinine 1.3, weight over 60 kg  Denies chest pain concerning for angina  EKG personally reviewed by myself on todays visit     Additional cardiac records reviewed  hospital non-STEMI June 2024 troponin 1300 Cardiac catheterization June 15, 2023 Severe multivessel coronary disease with unclear culprit Moderately reduced ejection fraction Transfer to [facility] Not a candidate for CABG Left circumflex felt to be subtotal, very small in caliber, diffuse moderate disease of mid LAD and large diagonal with heavy calcification  RCA with critical ostial and mid vessel disease with stents between with moderate in-stent restenosis, entire RCA severely calcified   Echo June 16, 2023 EF 35 to 40% Moderate aortic valve stenosis mean gradient 16 mmHg, peak gradient 25, estimated aortic valve area 0.8, Low-flow, low gradient  Seen by [facility] cardiology July 12, 2023 Statin held, spironolactone 12.53 days a week, Jardiance 10, Imdur  held, Lasix  as needed  On further follow-up losartan  added but caused orthostasis symptoms  Echo at [facility] September 05, 2023 Aortic valve stenosis mean gradient 21  mmHg peak gradient 35 Moderate MR EF essentially unchanged 35%   TAVR end of November 2024  Admitted to the hospital with chest pain May 11, 2023 Troponin 1324 Cardiac catheterization performed June 15, 2023 Severe multivessel coronary disease with unclear culprit Ostial-proximal LCx subtotal 99 to 100% occlusion with <TIMI I flow Mild diffuse proximal  LAD disease with 70% stenosis after short mid vessel diagonal branch, the large Ramus-like first diagonal with focal 50% stenosis Heavily calcified ostial RCA (very difficult to engage) 95% stenosis followed by diffuse 40 to 60% ISR of the proximal to mid vessel stent followed by 99 % subtotal occlusion after the stent crossing major RV marginal branch. Brisk collaterals fill the PDA  Moderate reduced EF with basal to mid inferior essentially akinesis and inferolateral hypokinesis.  Transfer to [facility] Chest [facility]-free at [facility] Left circumflex felt to be subtotal, very small in caliber, diffuse moderate disease of mid LAD and large diagonal with heavy calcification RCA with critical ostial and mid vessel disease with stents between with moderate in-stent restenosis, entire RCA severely calcified CABG not felt to be a good candidate Eliquis , Plavix , isosorbide , Ranexa   Zio monitor from April 2024 Frequent episodes of tachycardia, frequent PACs 408 Supraventricular Tachycardia runs occurred, the run with the fastest interval lasting 5 beats with a max rate of 146 bpm, the longest lasting 25.4 secs with an avg rate of 110 bpm.  History of bradycardia  past medical history reviewed history of left parieto-occipital hemorrhagic stroke  seen by ophthamologist 3/22 who did HVF test which showed right inf>sup quandrantopsia. felt this was a new finding [name] denied any new visual field deficits.  He has been driving Seen in the Er 6/84/7977 Head MRI: nothing acute  01/16/2020,  ED for syncope 3 alcoholic beverages that night, which was more than his usual  woke around 2 AM, and after entering the kitchen, had a syncopal episode  --received his second COVID-19 vaccination 4 days prior. usually spends some time on the edge of the bed and seated before standing, in order to prevent dizziness  daughter reports the fall / LOC woke her from sleep She found him slumped over on the floor.  he was cold to  the touch and unable to speak  blood pressure was low.  EMS took his BP when they arrived and noted it was low.  MRA was without acute abnormality and showed mild chronic small vessel ischemic dz and moderate cerebral atrophy. chronic hemorrhagic L temporoparietal infarct.     fall while in [place] 2019 Was watching a football game, stood up and had a mechanical fall tripped over some shoes 10/24/2018 Suffered a hip fracture , was hospitalized  Following the surgery the physicians increased his lisinopril  up to 20 mg daily and placed him on aspirin  325 mg daily He has started to have some upset stomach, wonders if it could be the high-dose aspirin   February 2018 he had acute vision changes, word finding difficulty. He was taken by life flight to [facility] hospital MRI/MRA showing acute stroke Details of scan as below: 1. Acute left parieto-occipital intraparenchymal hematoma with adjacent subarachnoid hemorrhage. This most likely represents hemorrhagic transformation from an embolic infarct given its configuration. No underlying mass. No other findings to suggest amyloid angiopathy. 2. Normal MRA of the head.  He had a CT scan of the neck showing 50% stenosis on the left, 10% on the right  PMH:   has a past medical history of Acute embolic stroke (  HCC) (02/01/2017), BPH (benign prostatic hyperplasia), Bradycardia (11/10/2015), Coronary artery disease, Dizziness (08/21/2012), DYSPEPSIA (07/31/2010), Hyperlipidemia (05/16/2010), Hypertension, HYPERTENSION, BENIGN (05/16/2010), and Stenosis of right carotid artery (02/01/2017).  PSH:    Past Surgical History:  Procedure Laterality Date   ESOPHAGOGASTRODUODENOSCOPY (EGD) WITH PROPOFOL  N/A 01/08/2023   Procedure: ESOPHAGOGASTRODUODENOSCOPY (EGD) WITH PROPOFOL ;  Surgeon: Jinny Carmine, MD;  Location: [facility] ENDOSCOPY;  Service: Endoscopy;  Laterality: N/A;   HIP SURGERY     right hip    Intestinal Blockage     LEFT HEART CATH AND CORONARY ANGIOGRAPHY N/A  06/15/2023   Procedure: LEFT HEART CATH AND CORONARY ANGIOGRAPHY;  Surgeon: Anner Alm ORN, MD;  Location: [facility] INVASIVE CV LAB;  Service: Cardiovascular;  Laterality: N/A;   LOOP RECORDER INSERTION N/A 05/14/2017   Procedure: Loop Recorder Insertion;  Surgeon: Fernande Elspeth BROCKS, MD;  Location: [facility] INVASIVE CV LAB;  Service: Cardiovascular;  Laterality: N/A;   SKIN CANCER EXCISION     x2   TONSILLECTOMY      Current Outpatient Medications  Medication Sig Dispense Refill   acetaminophen  (TYLENOL ) 650 MG CR tablet Take 650 mg by mouth every 8 (eight) hours as needed.      apixaban  (ELIQUIS ) 2.5 MG TABS tablet Take 1 tablet (2.5 mg total) by mouth 2 (two) times daily. 180 tablet 3   Ascorbic Acid (VITAMIN C ) 1000 MG tablet Take 1,000 mg by mouth daily.     atorvastatin  (LIPITOR) 20 MG tablet Take 20 mg by mouth at bedtime.     b complex vitamins tablet Take 1 tablet by mouth daily.     cholecalciferol  (VITAMIN D ) 1000 units tablet Take 2,000 Units by mouth daily.     clopidogrel  (PLAVIX ) 75 MG tablet Take 1 tablet (75 mg total) by mouth daily. 90 tablet 0   cyanocobalamin  (VITAMIN B12) 1000 MCG tablet Take 1,000 mcg by mouth daily.     docusate sodium  (COLACE) 100 MG capsule Take 100 mg by mouth 2 (two) times daily.     empagliflozin (JARDIANCE) 25 MG TABS tablet Take 12.5 mg by mouth.     ezetimibe  (ZETIA ) 10 MG tablet Take 1 tablet (10 mg total) by mouth daily. 90 tablet 3   FLAXSEED, LINSEED, PO Take 2 g by mouth daily.     furosemide  (LASIX ) 20 MG tablet Take 20 mg by mouth as needed. (Patient taking differently: Take 20 mg by mouth daily.)     metoprolol  succinate (TOPROL -XL) 25 MG 24 hr tablet Take 12.5 mg by mouth daily.     nitroGLYCERIN  (NITROSTAT ) 0.4 MG SL tablet Place 1 tablet (0.4 mg total) under the tongue every 5 (five) minutes x 3 doses as needed for chest pain. 25 tablet 0   pantoprazole  (PROTONIX ) 40 MG tablet Take 1 tablet (40 mg total) by mouth 2 (two) times daily. 60 tablet  1   potassium chloride  (KLOR-CON ) 10 MEQ tablet Take 10 mEq by mouth as needed. (Patient taking differently: Take 10 mEq by mouth daily.)     pregabalin  (LYRICA ) 150 MG capsule Take 150 mg by mouth at bedtime.     Probiotic Product (UP4 PROBIOTICS PO) Take 7.5 mg by mouth.     Psyllium (GERI-MUCIL) 25 % POWD Take by mouth.     ranolazine  (RANEXA ) 500 MG 12 hr tablet Take 1 tablet (500 mg total) by mouth 2 (two) times daily. 180 tablet 3   tamsulosin  (FLOMAX ) 0.4 MG CAPS capsule Take 2 capsules (0.8 mg total) by mouth daily. 30 capsule  0   zinc  sulfate 220 (50 Zn) MG capsule Take 1 tablet by mouth daily.     pregabalin  (LYRICA ) 100 MG capsule Take 100 mg by mouth in the morning. (Patient not taking: Reported on 05/25/2024)     No current facility-administered medications for this visit.    Allergies:   Penicillins, Gabapentin, Penicillin g, and Codeine   Social History:  The patient  reports that he has never smoked. He has never used smokeless tobacco. He reports that he does not currently use alcohol. He reports that he does not use drugs.   Family History:   family history includes Cancer in an other family member; Hyperlipidemia in an other family member; Hypertension in his mother and another family member; Other in an other family member.    Review of Systems: Review of Systems  Constitutional: Negative.   HENT: Negative.    Respiratory: Negative.    Cardiovascular: Negative.   Gastrointestinal: Negative.   Musculoskeletal:  Positive for joint pain.  Neurological:  Positive for tremors.  Psychiatric/Behavioral:  Positive for memory loss.   All other systems reviewed and are negative.   PHYSICAL EXAM: VS:  BP 118/60 (BP Location: Left Arm, Patient Position: Sitting, Cuff Size: Normal)   Ht 5' 8 (1.727 m)   Wt 180 lb 8 oz (81.9 kg)   SpO2 94%   BMI 27.44 kg/m  , BMI Body mass index is 27.44 kg/m. Constitutional:  oriented to person, place, and time. No distress.  HENT:   Head: Grossly normal Eyes:  no discharge. No scleral icterus.  Neck: No JVD, no carotid bruits  Cardiovascular: Irregularly irregular, no murmurs appreciated, 2+ pitting bilateral lower extremity edema, woody edema up to the thighs Pulmonary/Chest: Clear to auscultation bilaterally, no wheezes or rails Abdominal: Soft.  no distension.  no tenderness.  Musculoskeletal: Normal range of motion Neurological:  normal muscle tone. Coordination normal. No atrophy Skin: Skin warm and dry Psychiatric: normal affect, pleasant   Recent Labs: 06/26/2023: B Natriuretic Peptide 460.1 08/25/2023: ALT 17; BUN 22; Creatinine, Ser 1.44; Hemoglobin 13.2; Platelets 170; Potassium 4.0; Sodium 132    Lipid Panel Lab Results  Component Value Date   CHOL 90 06/16/2023   HDL 40 (L) 06/16/2023   LDLCALC 44 06/16/2023   TRIG 30 06/16/2023    Wt Readings from Last 3 Encounters:  05/25/24 180 lb 8 oz (81.9 kg)  04/22/24 174 lb 1.6 oz (79 kg)  02/17/24 175 lb (79.4 kg)     ASSESSMENT AND PLAN:  Atrial fibrillation/atrial flutter EKG showing atrial flutter with ventricular rate in the 60s likely contributing to CHF symptoms on today's visit On Ranexa , metoprolol  succinate 12.5 daily Continue Eliquis  2.5 twice daily, dose reduced by [facility] Creatinine 1.4, 81 kg, age 24 - If he remains in atrial flutter in follow-up, he may need cardioversion  Leg swelling Pitting edema up into his thighs, weight up 6 to 10 pounds Recommend increase Lasix  up to 20 twice daily with potassium twice daily Daily weights, goal weight less than 174 pounds, likely closer to 170 at home  Aortic valve stenosis Severe stenosis low gradient, status post TAVR Followed at [facility]  Hyperlipidemia, unspecified hyperlipidemia type - Continue Lipitor 20 mg daily  HYPERTENSION, BENIGN -  Blood pressure is well controlled on today's visit. No changes made to the medications.  Atherosclerosis of native coronary artery of native heart  with chronic stable angina pectoris - Severe three-vessel coronary disease not amenable to intervention Was seen by  interventional at [facility] and [facility] Tolerating Ranexa , metoprolol   Imdur  held by team at [facility] Denies chest pain concerning for angina  syncope, unspecified syncope type - Plan: EKG 12-Lead Remote syncope  Acute embolic stroke [facility]) Previously had loop monitor for 3 years with no significant arrhythmia Atrial fibrillation noted on prior office visit again 3% burden noted on event monitor through [facility] Noted to be in atrial flutter on today's visit likely brought on by bronchitis.  Stenosis of right carotid artery Nonobstructive disease, Continue tour if tolerated  Essential tremor Followed by neurology  Cardiomyopathy Ejection fraction 35 to 40% after recent non-STEMI Continue metoprolol  succinate, Jardiance Losartan  12.5 held for hypotension Lasix  up to 20 twice daily with close monitoring  Orders Placed This Encounter  Procedures   EKG 12-Lead     Signed, Velinda Lunger, M.D., Ph.D. 05/25/2024  [facility] [place], [place] [phone]

## 2024-05-25 ENCOUNTER — Ambulatory Visit: Attending: Cardiovascular Disease | Admitting: Cardiovascular Disease

## 2024-05-25 ENCOUNTER — Ambulatory Visit

## 2024-05-25 VITALS — BP 118/60 | Ht 68.0 in | Wt 180.5 lb

## 2024-05-25 DIAGNOSIS — Z953 Presence of xenogenic heart valve: Secondary | ICD-10-CM | POA: Diagnosis present

## 2024-05-25 DIAGNOSIS — I2510 Atherosclerotic heart disease of native coronary artery without angina pectoris: Secondary | ICD-10-CM | POA: Diagnosis not present

## 2024-05-25 DIAGNOSIS — I10 Essential (primary) hypertension: Secondary | ICD-10-CM | POA: Insufficient documentation

## 2024-05-25 DIAGNOSIS — I350 Nonrheumatic aortic (valve) stenosis: Secondary | ICD-10-CM | POA: Insufficient documentation

## 2024-05-25 DIAGNOSIS — I5020 Unspecified systolic (congestive) heart failure: Secondary | ICD-10-CM | POA: Diagnosis present

## 2024-05-25 DIAGNOSIS — I495 Sick sinus syndrome: Secondary | ICD-10-CM | POA: Diagnosis not present

## 2024-05-25 DIAGNOSIS — E785 Hyperlipidemia, unspecified: Secondary | ICD-10-CM | POA: Insufficient documentation

## 2024-05-25 DIAGNOSIS — Z79899 Other long term (current) drug therapy: Secondary | ICD-10-CM | POA: Diagnosis present

## 2024-05-25 DIAGNOSIS — Z8673 Personal history of transient ischemic attack (TIA), and cerebral infarction without residual deficits: Secondary | ICD-10-CM | POA: Diagnosis present

## 2024-05-25 DIAGNOSIS — I471 Supraventricular tachycardia, unspecified: Secondary | ICD-10-CM | POA: Insufficient documentation

## 2024-05-25 DIAGNOSIS — I480 Paroxysmal atrial fibrillation: Secondary | ICD-10-CM | POA: Diagnosis not present

## 2024-05-25 MED ORDER — POTASSIUM CHLORIDE ER 10 MEQ PO TBCR: 10.0000 meq | EXTENDED_RELEASE_TABLET | Freq: Two times a day (BID) | ORAL | 1 refills | Status: DC

## 2024-05-25 MED ORDER — FUROSEMIDE 20 MG PO TABS: 20.0000 mg | ORAL_TABLET | Freq: Two times a day (BID) | ORAL | 1 refills | Status: DC

## 2024-05-25 NOTE — Patient Instructions (Addendum)
 Medication Instructions:   Please increase the lasix  up to 20 mg twice a day Potassium twice a day  Daily weights  If you need a refill on your cardiac medications before your next appointment, please call your pharmacy.   Lab work:  CMP today  Testing/Procedures: No new testing needed  Follow-Up: At [facility], you and your health needs are our priority.  As part of our continuing mission to provide you with exceptional heart care, we have created designated Provider Care Teams.  These Care Teams include your primary Cardiologist (physician) and Advanced Practice Providers (APPs -  Physician Assistants and Nurse Practitioners) who all work together to provide you with the care you need, when you need it.  You will need a follow up appointment in 2-3 weeks   Providers on your designated Care Team:   Lonni Meager, NP Bernardino Bring, PA-C Cadence [place], [place]  COVID-19 Vaccine Information can be found at: [email or web address] For questions related to vaccine distribution or appointments, please email [email or web address] or call [phone].

## 2024-05-26 ENCOUNTER — Ambulatory Visit: Payer: Self-pay | Admitting: Cardiovascular Disease

## 2024-05-26 LAB — COMPREHENSIVE METABOLIC PANEL WITH GFR
ALT: 16 IU/L (ref 0–44)
AST: 25 IU/L (ref 0–40)
Albumin: 4.3 g/dL (ref 3.7–4.7)
Alkaline Phosphatase: 141 IU/L — ABNORMAL HIGH (ref 44–121)
BUN/Creatinine Ratio: 15 (ref 10–24)
BUN: 22 mg/dL (ref 8–27)
Bilirubin Total: 1.8 mg/dL — ABNORMAL HIGH (ref 0.0–1.2)
CO2: 20 mmol/L (ref 20–29)
Calcium: 8.9 mg/dL (ref 8.6–10.2)
Chloride: 86 mmol/L — ABNORMAL LOW (ref 96–106)
Creatinine, Ser: 1.44 mg/dL — ABNORMAL HIGH (ref 0.76–1.27)
Globulin, Total: 2 g/dL (ref 1.5–4.5)
Glucose: 116 mg/dL — ABNORMAL HIGH (ref 70–99)
Potassium: 4.2 mmol/L (ref 3.5–5.2)
Sodium: 123 mmol/L — ABNORMAL LOW (ref 134–144)
Total Protein: 6.3 g/dL (ref 6.0–8.5)
eGFR: 47 mL/min/{1.73_m2} — ABNORMAL LOW (ref >59)

## 2024-05-27 ENCOUNTER — Ambulatory Visit

## 2024-06-01 ENCOUNTER — Ambulatory Visit

## 2024-06-03 ENCOUNTER — Ambulatory Visit

## 2024-06-03 DIAGNOSIS — Z952 Presence of prosthetic heart valve: Secondary | ICD-10-CM

## 2024-06-03 DIAGNOSIS — I214 Non-ST elevation (NSTEMI) myocardial infarction: Secondary | ICD-10-CM

## 2024-06-03 NOTE — Progress Notes (Signed)
 Cardiac Individual Treatment Plan  Patient Details  Name: [name]. MRN: [id number] Date of Birth: [birth year] Referring Provider:   Flowsheet Row Cardiac Rehab from 12/18/2023 in [facility] Cardiac and Pulmonary Rehab  Referring Provider Auston Purchase, MD    Initial Encounter Date:  Flowsheet Row Cardiac Rehab from 12/18/2023 in [facility] Cardiac and Pulmonary Rehab  Date 12/18/23    Visit Diagnosis: S/P TAVR (transcatheter aortic valve replacement)  NSTEMI (non-ST elevated myocardial infarction) [facility])  Patient's Home Medications on Admission:  Current Outpatient Medications:    acetaminophen  (TYLENOL ) 650 MG CR tablet, Take 650 mg by mouth every 8 (eight) hours as needed. , Disp: , Rfl:    apixaban  (ELIQUIS ) 2.5 MG TABS tablet, Take 1 tablet (2.5 mg total) by mouth 2 (two) times daily., Disp: 180 tablet, Rfl: 3   Ascorbic Acid (VITAMIN C ) 1000 MG tablet, Take 1,000 mg by mouth daily., Disp: , Rfl:    atorvastatin  (LIPITOR) 20 MG tablet, Take 20 mg by mouth at bedtime., Disp: , Rfl:    b complex vitamins tablet, Take 1 tablet by mouth daily., Disp: , Rfl:    cholecalciferol  (VITAMIN D ) 1000 units tablet, Take 2,000 Units by mouth daily., Disp: , Rfl:    clopidogrel  (PLAVIX ) 75 MG tablet, Take 1 tablet (75 mg total) by mouth daily., Disp: 90 tablet, Rfl: 0   cyanocobalamin  (VITAMIN B12) 1000 MCG tablet, Take 1,000 mcg by mouth daily., Disp: , Rfl:    docusate sodium  (COLACE) 100 MG capsule, Take 100 mg by mouth 2 (two) times daily., Disp: , Rfl:    empagliflozin (JARDIANCE) 25 MG TABS tablet, Take 12.5 mg by mouth., Disp: , Rfl:    ezetimibe  (ZETIA ) 10 MG tablet, Take 1 tablet (10 mg total) by mouth daily., Disp: 90 tablet, Rfl: 3   FLAXSEED, LINSEED, PO, Take 2 g by mouth daily., Disp: , Rfl:    furosemide  (LASIX ) 20 MG tablet, Take 1 tablet (20 mg total) by mouth 2 (two) times daily., Disp: 180 tablet, Rfl: 1   metoprolol  succinate (TOPROL -XL) 25 MG 24 hr tablet, Take 12.5 mg  by mouth daily., Disp: , Rfl:    nitroGLYCERIN  (NITROSTAT ) 0.4 MG SL tablet, Place 1 tablet (0.4 mg total) under the tongue every 5 (five) minutes x 3 doses as needed for chest pain., Disp: 25 tablet, Rfl: 0   pantoprazole  (PROTONIX ) 40 MG tablet, Take 1 tablet (40 mg total) by mouth 2 (two) times daily., Disp: 60 tablet, Rfl: 1   potassium chloride  (KLOR-CON ) 10 MEQ tablet, Take 1 tablet (10 mEq total) by mouth 2 (two) times daily., Disp: 180 tablet, Rfl: 1   pregabalin  (LYRICA ) 100 MG capsule, Take 100 mg by mouth in the morning. (Patient not taking: Reported on 05/25/2024), Disp: , Rfl:    pregabalin  (LYRICA ) 150 MG capsule, Take 150 mg by mouth at bedtime., Disp: , Rfl:    Probiotic Product (UP4 PROBIOTICS PO), Take 7.5 mg by mouth., Disp: , Rfl:    Psyllium (GERI-MUCIL) 25 % POWD, Take by mouth., Disp: , Rfl:    ranolazine  (RANEXA ) 500 MG 12 hr tablet, Take 1 tablet (500 mg total) by mouth 2 (two) times daily., Disp: 180 tablet, Rfl: 3   tamsulosin  (FLOMAX ) 0.4 MG CAPS capsule, Take 2 capsules (0.8 mg total) by mouth daily., Disp: 30 capsule, Rfl: 0   zinc  sulfate 220 (50 Zn) MG capsule, Take 1 tablet by mouth daily., Disp: , Rfl:   Past Medical History: Past Medical History:  Diagnosis Date   Acute embolic stroke (HCC) 02/01/2017   BPH (benign prostatic hyperplasia)    Bradycardia 11/10/2015   Coronary artery disease    Dizziness 08/21/2012   DYSPEPSIA 07/31/2010   Qualifier: Diagnosis of  By: Sebastian, RN, Rosina     Hyperlipidemia 05/16/2010   Qualifier: Diagnosis of  By: Perla MD, [name]      Hypertension    HYPERTENSION, BENIGN 05/16/2010   Qualifier: Diagnosis of  By: Perla MD, [name]     Stenosis of right carotid artery 02/01/2017    Tobacco Use: Social History   Tobacco Use  Smoking Status Never  Smokeless Tobacco Never    Labs: Review Flowsheet       Latest Ref Rng & Units 01/23/2010 06/15/2023 06/16/2023  Labs for ITP Cardiac and Pulmonary Rehab  Cholestrol 0 - 200 mg/dL 877   - 90   LDL (calc) 0 - 99 mg/dL 30.3  - 44   HDL-C >59 mg/dL 66.1  - 40   Trlycerides <150 mg/dL 93  - 30   Hemoglobin J8r 4.8 - 5.6 % 5.4  6.3  6.3  -    Details       Multiple values from one day are sorted in reverse-chronological order          Exercise Target Goals: Exercise Program Goal: Individual exercise prescription set using results from initial 6 min walk test and THRR while considering  patient's activity barriers and safety.   Exercise Prescription Goal: Initial exercise prescription builds to 30-45 minutes a day of aerobic activity, 2-3 days per week.  Home exercise guidelines will be given to patient during program as part of exercise prescription that the participant will acknowledge.   Education: Aerobic Exercise: - Group verbal and visual presentation on the components of exercise prescription. Introduces F.I.T.T principle from ACSM for exercise prescriptions.  Reviews F.I.T.T. principles of aerobic exercise including progression. Written material given at graduation. Flowsheet Row Cardiac Rehab from 12/18/2023 in [facility] Cardiac and Pulmonary Rehab  Education need identified 12/18/23    Education: Resistance Exercise: - Group verbal and visual presentation on the components of exercise prescription. Introduces F.I.T.T principle from ACSM for exercise prescriptions  Reviews F.I.T.T. principles of resistance exercise including progression. Written material given at graduation.    Education: Exercise & Equipment Safety: - Individual verbal instruction and demonstration of equipment use and safety with use of the equipment. Flowsheet Row Cardiac Rehab from 12/18/2023 in [facility] Cardiac and Pulmonary Rehab  Date 12/18/23  Educator MB  Instruction Review Code 1- Verbalizes Understanding    Education: Exercise Physiology & General Exercise Guidelines: - Group verbal and written instruction with models to review the exercise physiology of the cardiovascular system and  associated critical values. Provides general exercise guidelines with specific guidelines to those with heart or lung disease.  Flowsheet Row Cardiac Rehab from 12/18/2023 in [facility] Cardiac and Pulmonary Rehab  Education need identified 12/18/23    Education: Flexibility, Balance, Mind/Body Relaxation: - Group verbal and visual presentation with interactive activity on the components of exercise prescription. Introduces F.I.T.T principle from ACSM for exercise prescriptions. Reviews F.I.T.T. principles of flexibility and balance exercise training including progression. Also discusses the mind body connection.  Reviews various relaxation techniques to help reduce and manage stress (i.e. Deep breathing, progressive muscle relaxation, and visualization). Balance handout provided to take home. Written material given at graduation.   Activity Barriers & Risk Stratification:  Activity Barriers & Cardiac Risk Stratification - 12/18/23 1503  Activity Barriers & Cardiac Risk Stratification   Activity Barriers Arthritis;Back Problems;Balance Concerns;Muscular Weakness    Cardiac Risk Stratification High          6 Minute Walk:  6 Minute Walk     Row Name 12/18/23 1501 04/22/24 1125       6 Minute Walk   Phase Initial Discharge    Distance 805 feet 750 feet    Distance % Change -- -6.8 %    Distance Feet Change -- -55 ft    Walk Time 6 minutes 6 minutes    # of Rest Breaks 0 0    MPH 1.52 1.4    METS 1.16 1    RPE 11 13    Perceived Dyspnea  0 1    VO2 Peak 4.06 3.19    Symptoms No No    Resting HR 64 bpm 71 bpm    Resting BP 146/86 116/62    Resting Oxygen Saturation  97 % 97 %    Exercise Oxygen Saturation  during 6 min walk 96 % 95 %    Max Ex. HR 90 bpm 84 bpm    Max Ex. BP 130/60 132/72    2 Minute Post BP 120/64 --       Oxygen Initial Assessment:   Oxygen Re-Evaluation:   Oxygen Discharge (Final Oxygen Re-Evaluation):   Initial Exercise Prescription:  Initial  Exercise Prescription - 12/18/23 1500       Date of Initial Exercise RX and Referring Provider   Date 12/18/23    Referring Provider Auston Purchase, MD      Oxygen   Maintain Oxygen Saturation 88% or higher      Recumbant Bike   Level 1    RPM 50    Watts 15    Minutes 15    METs 1.16      NuStep   Level 1    SPM 80    Minutes 15    METs 1.16      Biostep-RELP   Level 1    Minutes 15    METs 1.16      Track   Laps 21    Minutes 15    METs 1.16      Prescription Details   Frequency (times per week) 2    Duration Progress to 30 minutes of continuous aerobic without signs/symptoms of physical distress      Intensity   THRR 40-80% of Max Heartrate 90-118    Ratings of Perceived Exertion 11-13    Perceived Dyspnea 0-4      Progression   Progression Continue to progress workloads to maintain intensity without signs/symptoms of physical distress.      Resistance Training   Training Prescription Yes    Weight 2lb    Reps 10-15          Perform Capillary Blood Glucose checks as needed.  Exercise Prescription Changes:   Exercise Prescription Changes     Row Name 12/18/23 1500 01/07/24 1500 01/23/24 1600 02/04/24 1500 02/17/24 1100     Response to Exercise   Blood Pressure (Admit) 146/86 124/70 122/64 120/64 --   Blood Pressure (Exercise) 130/60 126/70 130/80 118/62 --   Blood Pressure (Exit) 120/64 124/70 110/72 118/60 --   Heart Rate (Admit) 64 bpm 66 bpm 69 bpm 75 bpm --   Heart Rate (Exercise) 90 bpm 101 bpm 100 bpm 95 bpm --   Heart Rate (Exit) 62 bpm 73 bpm 67 bpm  78 bpm --   Oxygen Saturation (Admit) 97 % -- -- -- --   Oxygen Saturation (Exercise) 96 % -- -- -- --   Oxygen Saturation (Exit) 96 % -- -- -- --   Rating of Perceived Exertion (Exercise) 11 13 13 14  --   Perceived Dyspnea (Exercise) 0 0 0 -- --   Symptoms none none none none --   Comments results First 2 weeks of exercise -- -- --   Duration Progress to 30 minutes of  aerobic  without signs/symptoms of physical distress Progress to 30 minutes of  aerobic without signs/symptoms of physical distress Progress to 30 minutes of  aerobic without signs/symptoms of physical distress Continue with 30 min of aerobic exercise without signs/symptoms of physical distress. Continue with 30 min of aerobic exercise without signs/symptoms of physical distress.   Intensity THRR New THRR New THRR New THRR unchanged THRR unchanged     Progression   Progression Continue to progress workloads to maintain intensity without signs/symptoms of physical distress. Continue to progress workloads to maintain intensity without signs/symptoms of physical distress. Continue to progress workloads to maintain intensity without signs/symptoms of physical distress. Continue to progress workloads to maintain intensity without signs/symptoms of physical distress. Continue to progress workloads to maintain intensity without signs/symptoms of physical distress.   Average METs 1.16 1.96 1.96 1.8 1.8     Resistance Training   Training Prescription -- Yes Yes Yes Yes   Weight -- 2lb 2lb 2lb 2lb   Reps -- 10-15 10-15 10-15 10-15     Interval Training   Interval Training -- No No No No     NuStep   Level -- 2  T6 3 2 2    Minutes -- 15 15 15 15    METs -- 1.9 1.8 1.36 1.36     T5 Nustep   Level -- -- 2  T6 2  T6 2  T6   Minutes -- -- 15 15 15    METs -- -- 1.6 2 2      Biostep-RELP   Level -- 1 -- -- --   Minutes -- 15 -- -- --   METs -- 2 -- -- --     Track   Laps -- 21 21 21 21    Minutes -- 15 15 15 15    METs -- 2.14 2.14 2.14 2.14     Home Exercise Plan   Plans to continue exercise at -- -- -- -- Home (comment)  walking   Frequency -- -- -- -- Add 2 additional days to program exercise sessions.   Initial Home Exercises Provided -- -- -- -- 02/17/24     Oxygen   Maintain Oxygen Saturation -- 88% or higher 88% or higher 88% or higher 88% or higher    Row Name 02/17/24 1400 03/05/24 1600  03/20/24 0700 04/02/24 1100 04/16/24 1500     Response to Exercise   Blood Pressure (Admit) 122/70 122/82 112/58 114/62 114/68   Blood Pressure (Exercise) 140/80 124/68 -- -- --   Blood Pressure (Exit) 122/68 114/66 126/60 142/64 126/72   Heart Rate (Admit) 77 bpm 67 bpm 66 bpm 76 bpm 76 bpm   Heart Rate (Exercise) 98 bpm 95 bpm 103 bpm 103 bpm 97 bpm   Heart Rate (Exit) 74 bpm 74 bpm 74 bpm 87 bpm 75 bpm   Rating of Perceived Exertion (Exercise) 13 13 15 15 13    Symptoms none none none none none   Duration Continue with 30 min  of aerobic exercise without signs/symptoms of physical distress. Continue with 30 min of aerobic exercise without signs/symptoms of physical distress. Continue with 30 min of aerobic exercise without signs/symptoms of physical distress. Continue with 30 min of aerobic exercise without signs/symptoms of physical distress. Continue with 30 min of aerobic exercise without signs/symptoms of physical distress.   Intensity THRR unchanged THRR unchanged THRR unchanged THRR unchanged THRR unchanged     Progression   Progression Continue to progress workloads to maintain intensity without signs/symptoms of physical distress. Continue to progress workloads to maintain intensity without signs/symptoms of physical distress. Continue to progress workloads to maintain intensity without signs/symptoms of physical distress. Continue to progress workloads to maintain intensity without signs/symptoms of physical distress. Continue to progress workloads to maintain intensity without signs/symptoms of physical distress.   Average METs 1.86 2.34 2.21 2.09 2.14     Resistance Training   Training Prescription Yes Yes Yes Yes Yes   Weight 2lb 2lb 2 lb 2 lb 2 lb   Reps 10-15 10-15 10-15 10-15 10-15     Interval Training   Interval Training No No No No No     NuStep   Level -- 1 1  T6 nustep 1  T6 nustep 2  T6 nustep   Minutes -- 15 15 15 15    METs -- 2.4 3.1 2.5 1.9     T5 Nustep    Level 2  T6 -- -- -- --   Minutes 15 -- -- -- --   METs 1.9 -- -- -- --     Track   Laps 20 35 22 17 17    Minutes 15 30 15 15 15    METs 2.09 2.9 2.2 1.92 1.92     Home Exercise Plan   Plans to continue exercise at Home (comment)  walking Home (comment)  walking Home (comment)  walking Home (comment)  walking Home (comment)  walking   Frequency Add 2 additional days to program exercise sessions. Add 2 additional days to program exercise sessions. Add 2 additional days to program exercise sessions. Add 2 additional days to program exercise sessions. Add 2 additional days to program exercise sessions.   Initial Home Exercises Provided 02/17/24 02/17/24 02/17/24 02/17/24 02/17/24     Oxygen   Maintain Oxygen Saturation 88% or higher 88% or higher 88% or higher 88% or higher 88% or higher    Row Name 04/28/24 1300 05/15/24 0700           Response to Exercise   Blood Pressure (Admit) 132/72 122/78      Blood Pressure (Exit) 122/68 124/68      Heart Rate (Admit) 70 bpm 70 bpm      Heart Rate (Exercise) 106 bpm 85 bpm      Heart Rate (Exit) 73 bpm 68 bpm      Rating of Perceived Exertion (Exercise) 13 15      Symptoms none none      Duration Continue with 30 min of aerobic exercise without signs/symptoms of physical distress. Continue with 30 min of aerobic exercise without signs/symptoms of physical distress.      Intensity THRR unchanged THRR unchanged        Progression   Progression Continue to progress workloads to maintain intensity without signs/symptoms of physical distress. Continue to progress workloads to maintain intensity without signs/symptoms of physical distress.      Average METs 2.21 2.28        Resistance Training   Training Prescription  Yes Yes      Weight 2 lb 2 lb      Reps 10-15 10-15        Interval Training   Interval Training No No        NuStep   Level 2  T6 nustep 2  T6 nustep      Minutes 15 15      METs 2 2        Track   Laps 20 21       Minutes 15 15      METs 2.09 2.14        Home Exercise Plan   Plans to continue exercise at Home (comment)  walking Home (comment)  walking      Frequency Add 2 additional days to program exercise sessions. Add 2 additional days to program exercise sessions.      Initial Home Exercises Provided 02/17/24 02/17/24        Oxygen   Maintain Oxygen Saturation 88% or higher 88% or higher         Exercise Comments:   Exercise Comments     Row Name 12/23/23 1128           Exercise Comments First full day of exercise!  Patient was oriented to gym and equipment including functions, settings, policies, and procedures.  Patient's individual exercise prescription and treatment plan were reviewed.  All starting workloads were established based on the results of the 6 minute walk test done at initial orientation visit.  The plan for exercise progression was also introduced and progression will be customized based on patient's performance and goals.          Exercise Goals and Review:   Exercise Goals     Row Name 12/18/23 1507             Exercise Goals   Increase Physical Activity Yes       Intervention Provide advice, education, support and counseling about physical activity/exercise needs.;Develop an individualized exercise prescription for aerobic and resistive training based on initial evaluation findings, risk stratification, comorbidities and participant's personal goals.       Expected Outcomes Short Term: Attend rehab on a regular basis to increase amount of physical activity.;Long Term: Add in home exercise to make exercise part of routine and to increase amount of physical activity.;Long Term: Exercising regularly at least 3-5 days a week.       Increase Strength and Stamina Yes       Intervention Provide advice, education, support and counseling about physical activity/exercise needs.;Develop an individualized exercise prescription for aerobic and resistive training based on  initial evaluation findings, risk stratification, comorbidities and participant's personal goals.       Expected Outcomes Short Term: Increase workloads from initial exercise prescription for resistance, speed, and METs.;Short Term: Perform resistance training exercises routinely during rehab and add in resistance training at home;Long Term: Improve cardiorespiratory fitness, muscular endurance and strength as measured by increased METs and functional capacity ( )       Able to understand and use rate of perceived exertion (RPE) scale Yes       Intervention Provide education and explanation on how to use RPE scale       Expected Outcomes Short Term: Able to use RPE daily in rehab to express subjective intensity level;Long Term:  Able to use RPE to guide intensity level when exercising independently       Able to understand and use Dyspnea scale Yes  Intervention Provide education and explanation on how to use Dyspnea scale       Expected Outcomes Short Term: Able to use Dyspnea scale daily in rehab to express subjective sense of shortness of breath during exertion;Long Term: Able to use Dyspnea scale to guide intensity level when exercising independently       Knowledge and understanding of Target Heart Rate Range (THRR) Yes       Intervention Provide education and explanation of THRR including how the numbers were predicted and where they are located for reference       Expected Outcomes Short Term: Able to state/look up THRR;Short Term: Able to use daily as guideline for intensity in rehab;Long Term: Able to use THRR to govern intensity when exercising independently       Able to check pulse independently Yes       Intervention Provide education and demonstration on how to check pulse in carotid and radial arteries.;Review the importance of being able to check your own pulse for safety during independent exercise       Expected Outcomes Short Term: Able to explain why pulse checking is  important during independent exercise;Long Term: Able to check pulse independently and accurately       Understanding of Exercise Prescription Yes       Intervention Provide education, explanation, and written materials on patient's individual exercise prescription       Expected Outcomes Short Term: Able to explain program exercise prescription;Long Term: Able to explain home exercise prescription to exercise independently          Exercise Goals Re-Evaluation :  Exercise Goals Re-Evaluation     Row Name 12/23/23 1129 01/07/24 1528 01/23/24 1654 02/04/24 1526 02/17/24 1145     Exercise Goal Re-Evaluation   Exercise Goals Review Increase Physical Activity;Able to understand and use rate of perceived exertion (RPE) scale;Knowledge and understanding of Target Heart Rate Range (THRR);Understanding of Exercise Prescription;Increase Strength and Stamina;Able to check pulse independently Increase Physical Activity;Increase Strength and Stamina;Understanding of Exercise Prescription Increase Physical Activity;Increase Strength and Stamina;Understanding of Exercise Prescription Increase Physical Activity;Increase Strength and Stamina;Understanding of Exercise Prescription Increase Physical Activity;Able to understand and use rate of perceived exertion (RPE) scale;Knowledge and understanding of Target Heart Rate Range (THRR);Understanding of Exercise Prescription;Increase Strength and Stamina;Able to check pulse independently   Comments Reviewed RPE and dyspnea scale, THR and program prescription with pt today.  Pt voiced understanding and was given a copy of goals to take home. Semir is off to a good start in the program. He was able to increase his level on the T6 nustep to level 2. He was also able to maintain his track laps, and level on the biostep. We will continue to monitor his progress in the program. Irby is doing well in rehab. He was recently able to increase his level on the T4 nustep from 2 to  3. He was also able to maintain 21 laps on the track in 15 minutes. We will continue to monitor his progress in the program. Jaclyn is doing well in rehab. He was able to maintain 21 laps on the track and level 2 on the T6 nustep. We will continue to monitor his progress in the program. Reviewed home exercise with pt today.  Pt plans to walk at home for exercise.  Reviewed THR, pulse, RPE, sign and symptoms, pulse oximetery and when to call 911 or MD.  Also discussed weather considerations and indoor options.  Pt voiced understanding.  Expected Outcomes Short: Use RPE daily to regulate intensity.  Long: Follow program prescription in THR. Short: Continue to increase level on the T6 nustep, and push for more laps on the track. Long: Continue exercise to improve strength and stamina. Short: Continue to increase level on the T6 nustep, and push for more laps on the track. Long: Continue exercise to improve strength and stamina. Short: Continue to push for more laps on the track. Long: Continue exercise to improve strength and stamina. Short: walk at home 1-2 days a week on off days of rehab, following recommendations provided by staff in home exercise review. Long: maintain independent exercise routine upon graduation from cardiac rehab.    Row Name 02/17/24 1455 03/05/24 1609 03/20/24 0754 04/01/24 1125 04/02/24 1113     Exercise Goal Re-Evaluation   Exercise Goals Review Increase Physical Activity;Understanding of Exercise Prescription;Increase Strength and Stamina Increase Physical Activity;Understanding of Exercise Prescription;Increase Strength and Stamina Increase Physical Activity;Understanding of Exercise Prescription;Increase Strength and Stamina Increase Physical Activity;Understanding of Exercise Prescription;Increase Strength and Stamina Increase Physical Activity;Understanding of Exercise Prescription;Increase Strength and Stamina   Comments Adell is doing well in rehab. He walked 20 laps on the  track and was able to maintain level 2 on the T6. We will continue to monitor his progress in the program. Yovanny continues to do well in rehab. He was able to maintain a level 1 on the T6 nustep. He was also able to increase from 20 to 35 laps on the track. We will continue to monitor his progress in the program. Jarid continues to do well in rehab. He was able to maintain a level 1 on the T6 nustep. He was also able to increase from 20 to 22 laps on the track when walking for 15 minutes. We will continue to monitor his progress in the program. Nathanyel is doing well and continues to improve. He states that he continues to do exercise at home, which consists of walking outside. We will continue to monitor his progress in the program. Redding has only attended two sessions since the last review. He has continued to work at level 1 on the T6 nustep. He also saw a decrease in his laps walked on the track from 22 to 17 laps. We will continue to monitor his progress in the program.   Expected Outcomes Short: Push for more laps on the track and try level 3 on the T6 nustep. Long: Continue exercise to improve strength and stamina. Short: Continue to increase track laps, return to level 2 on the T6 nustep. Long: Continue exercise to improve strength and stamina. Short: Increase to level 2 on the T6 nustep. Long: Continue exercise to improve strength and stamina. Short: Continue to exercise at home. Long: Continue exercise to improve strength and stamina. Short: Attend rehab more consistently. Long: Continue exercise to improve strength and stamina.    Row Name 04/16/24 1535 04/28/24 1349 05/04/24 1119 05/15/24 0748 05/28/24 1551     Exercise Goal Re-Evaluation   Exercise Goals Review Increase Physical Activity;Understanding of Exercise Prescription;Increase Strength and Stamina Increase Physical Activity;Understanding of Exercise Prescription;Increase Strength and Stamina Increase Physical Activity;Increase Strength  and Stamina;Understanding of Exercise Prescription Increase Physical Activity;Increase Strength and Stamina;Understanding of Exercise Prescription Increase Physical Activity;Increase Strength and Stamina;Understanding of Exercise Prescription   Comments Burnette continues to do well in rehab. He improved to  level 2 on the T6 nustep. He also continues to walk 17 laps on the track after previously walking 22 laps.  We will continue to monitor his progress in the program. Ehtan continues to do well in rehab. He recently completed his post . He continues to work at level 2 on the T6 nustep. He also increased back up to 20 laps on the track after previously walking only 17 laps. We will continue to monitor his progress until he graduates from the program. Gilbert is walking at home most every day. He walks up and down his hallway as he can tolerate. Turki continues to do well in rehab. He increased back up to 21 laps walked on the track. He also continues to work at level 2 on the T6 nustep and use 2 lb hand weights for exercise. We will continue to monitor his progress in the program. Chantz has not attended rehab since the last review. He last attended the program on 05/06/2024. He only has 3 sessions left until he graduates from the program.   Expected Outcomes Short: Increase laps on the track back up to 22 laps. Long: Continue exercise to improve strength and stamina. Short: Graduate. Long: Continue to exercise independently. Short: continue to make an effort to be active at home.Long: maintain exercise routine upon graduation from program. Short: Continue to push for more laps on the track. Long: Continue exercise to improve strength and stamina. Short: Return to rehab and graduate from the program. Long: Continue exercise to improve strength and stamina.      Discharge Exercise Prescription (Final Exercise Prescription Changes):  Exercise Prescription Changes - 05/15/24 0700       Response to Exercise    Blood Pressure (Admit) 122/78    Blood Pressure (Exit) 124/68    Heart Rate (Admit) 70 bpm    Heart Rate (Exercise) 85 bpm    Heart Rate (Exit) 68 bpm    Rating of Perceived Exertion (Exercise) 15    Symptoms none    Duration Continue with 30 min of aerobic exercise without signs/symptoms of physical distress.    Intensity THRR unchanged      Progression   Progression Continue to progress workloads to maintain intensity without signs/symptoms of physical distress.    Average METs 2.28      Resistance Training   Training Prescription Yes    Weight 2 lb    Reps 10-15      Interval Training   Interval Training No      NuStep   Level 2   T6 nustep   Minutes 15    METs 2      Track   Laps 21    Minutes 15    METs 2.14      Home Exercise Plan   Plans to continue exercise at Home (comment)   walking   Frequency Add 2 additional days to program exercise sessions.    Initial Home Exercises Provided 02/17/24      Oxygen   Maintain Oxygen Saturation 88% or higher          Nutrition:  Target Goals: Understanding of nutrition guidelines, daily intake of sodium 1500mg , cholesterol 200mg , calories 30% from fat and 7% or less from saturated fats, daily to have 5 or more servings of fruits and vegetables.  Education: All About Nutrition: -Group instruction provided by verbal, written material, interactive activities, discussions, models, and posters to present general guidelines for heart healthy nutrition including fat, fiber, MyPlate, the role of sodium in heart healthy nutrition, utilization of the nutrition label, and utilization of this knowledge for meal planning. Follow  up email sent as well. Written material given at graduation. Flowsheet Row Cardiac Rehab from 12/18/2023 in [facility] Cardiac and Pulmonary Rehab  Education need identified 12/18/23    Biometrics:  Pre Biometrics - 12/18/23 1508       Pre Biometrics   Height 5' 8.4 (1.737 m)    Weight 174 lb 1.6 oz (79  kg)    Waist Circumference 42.5 inches    Hip Circumference 38.5 inches    Waist to Hip Ratio 1.1 %    BMI (Calculated) 26.17    Single Leg Stand 0.5 seconds          Post Biometrics - 04/22/24 1127        Post  Biometrics   Height 5' 8.4 (1.737 m)    Weight 174 lb 1.6 oz (79 kg)    Waist Circumference 41.5 inches    Hip Circumference 40 inches    Waist to Hip Ratio 1.04 %    BMI (Calculated) 26.17    Single Leg Stand 0 seconds          Nutrition Therapy Plan and Nutrition Goals:  Nutrition Therapy & Goals - 12/18/23 1509       Nutrition Therapy   RD appointment deferred Yes      Personal Nutrition Goals   Nutrition Goal RD appointment deferred      Intervention Plan   Intervention Prescribe, educate and counsel regarding individualized specific dietary modifications aiming towards targeted core components such as weight, hypertension, lipid management, diabetes, heart failure and other comorbidities.;Nutrition handout(s) given to patient.    Expected Outcomes Short Term Goal: Understand basic principles of dietary content, such as calories, fat, sodium, cholesterol and nutrients.          Nutrition Assessments:  MEDIFICTS Score Key: >=70 Need to make dietary changes  40-70 Heart Healthy Diet <= 40 Therapeutic Level Cholesterol Diet  Flowsheet Row Cardiac Rehab from 12/18/2023 in [facility] Cardiac and Pulmonary Rehab  Picture Your Plate Total Score on Admission 67   Picture Your Plate Scores: <59 Unhealthy dietary pattern with much room for improvement. 41-50 Dietary pattern unlikely to meet recommendations for good health and room for improvement. 51-60 More healthful dietary pattern, with some room for improvement.  >60 Healthy dietary pattern, although there may be some specific behaviors that could be improved.    Nutrition Goals Re-Evaluation:  Nutrition Goals Re-Evaluation     Row Name 04/01/24 1116 05/04/24 1127           Goals   Nutrition Goal  RD appointment deferred RD appointment deferred         Nutrition Goals Discharge (Final Nutrition Goals Re-Evaluation):  Nutrition Goals Re-Evaluation - 05/04/24 1127       Goals   Nutrition Goal RD appointment deferred          Psychosocial: Target Goals: Acknowledge presence or absence of significant depression and/or stress, maximize coping skills, provide positive support system. Participant is able to verbalize types and ability to use techniques and skills needed for reducing stress and depression.   Education: Stress, Anxiety, and Depression - Group verbal and visual presentation to define topics covered.  Reviews how body is impacted by stress, anxiety, and depression.  Also discusses healthy ways to reduce stress and to treat/manage anxiety and depression.  Written material given at graduation. Flowsheet Row Cardiac Rehab from 07/15/2023 in [facility] Cardiac [facility] Pul[facility] Rehab  Education need identified 07/15/23    Education: Sleep Hygiene -Provides group verbal  and written instruction about how sleep can affect your health.  Define sleep hygiene, discuss sleep cycles and impact of sleep habits. Review good sleep hygiene tips.    Initial Review & Psychosocial Screening:  Initial Psych Review & Screening - 12/17/23 1305       Initial Review   Current issues with Current Stress Concerns    Source of Stress Concerns Unable to participate in former interests or hobbies;Unable to perform yard/household activities      Family Dynamics   Good Support System? Yes   wife and family     Barriers   Psychosocial barriers to participate in program There are no identifiable barriers or psychosocial needs.;The patient should benefit from training in stress management and relaxation.      Screening Interventions   Interventions Encouraged to exercise;Provide feedback about the scores to participant;To provide support and resources with identified psychosocial needs    Expected  Outcomes Short Term goal: Utilizing psychosocial counselor, staff and physician to assist with identification of specific Stressors or current issues interfering with healing process. Setting desired goal for each stressor or current issue identified.;Long Term Goal: Stressors or current issues are controlled or eliminated.;Short Term goal: Identification and review with participant of any Quality of Life or Depression concerns found by scoring the questionnaire.;Long Term goal: The participant improves quality of Life and PHQ9 Scores as seen by post scores and/or verbalization of changes          Quality of Life Scores:   Quality of Life - 12/18/23 1509       Quality of Life Scores   Health/Function Pre 25 %    Socioeconomic Pre 27.92 %    Psych/Spiritual Pre 28 %    Family Pre 30 %    GLOBAL Pre 27.09 %         Scores of 19 and below usually indicate a poorer quality of life in these areas.  A difference of  2-3 points is a clinically meaningful difference.  A difference of 2-3 points in the total score of the Quality of Life Index has been associated with significant improvement in overall quality of life, self-image, physical symptoms, and general health in studies assessing change in quality of life.  PHQ-9: Review Flowsheet       05/04/2024 12/18/2023 07/15/2023  Depression screen PHQ 2/9  Decreased Interest 0 0 0  Down, Depressed, Hopeless 0 0 0  PHQ - 2 Score 0 0 0  Altered sleeping 0 0 0  Tired, decreased energy 0 1 2  Change in appetite 0 0 1  Feeling bad or failure about yourself  0 0 0  Trouble concentrating 0 2 3  Moving slowly or fidgety/restless 0 3 0  Suicidal thoughts 0 0 0  PHQ-9 Score 0 6 6  Difficult doing work/chores - Extremely dIfficult Somewhat difficult   Interpretation of Total Score  Total Score Depression Severity:  1-4 = Minimal depression, 5-9 = Mild depression, 10-14 = Moderate depression, 15-19 = Moderately severe depression, 20-27 = Severe  depression   Psychosocial Evaluation and Intervention:  Psychosocial Evaluation - 12/17/23 1314       Psychosocial Evaluation & Interventions   Comments 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  He had a stroke in the past that caused difficulty in word processing, so his wife mainly spoke on the phone during the orientation phone call. He has recovered better than  they expected after his TAVR. He has been walking more and has more energy. When asked about stress, it was mentioned that he really wants to get back to what he wants to do. His wife helps him when needed and notes that he really enjoyed cardiac rehab in the past so they both think this will help him get closer to where he wants to be, He has a good support system. He reports no sleep concerns or concerns with anxiety or depression.    Expected Outcomes Short: attend cardiac rehab for education and exercise. Long: devleop and maintain positive self care habits    Continue Psychosocial Services  Follow up required by staff          Psychosocial Re-Evaluation:  Psychosocial Re-Evaluation     Row Name 04/01/24 1112 05/04/24 1128           Psychosocial Re-Evaluation   Current issues with None Identified None Identified      Comments Tavian states that he does not have any stress at this time. He has a good support system made up of his two sons and his wife. He reports no sleep concerns at this time. PHQ depresison screening questionaire was re-done. Patient's score went from a 6 to a 0. He states he is doing ok mentally, with his only concern not being able to do all the things he used to be able to do.      Expected Outcomes Short: Continue to attend cardiac rehab for stress relief. Long: Continue to maintain positive outlook. Short: continue to attend cardiac rehab  for mental health benefits. Long: maintain good mental health routine.      Interventions Encouraged to  attend Cardiac Rehabilitation for the exercise --      Continue Psychosocial Services  Follow up required by staff Follow up required by staff         Psychosocial Discharge (Final Psychosocial Re-Evaluation):  Psychosocial Re-Evaluation - 05/04/24 1128       Psychosocial Re-Evaluation   Current issues with None Identified    Comments PHQ depresison screening questionaire was re-done. Patient's score went from a 6 to a 0. He states he is doing ok mentally, with his only concern not being able to do all the things he used to be able to do.    Expected Outcomes Short: continue to attend cardiac rehab  for mental health benefits. Long: maintain good mental health routine.    Continue Psychosocial Services  Follow up required by staff          Vocational Rehabilitation: Provide vocational rehab assistance to qualifying candidates.   Vocational Rehab Evaluation & Intervention:   Education: Education Goals: Education classes will be provided on a variety of topics geared toward better understanding of heart health and risk factor modification. Participant will state understanding/return demonstration of topics presented as noted by education test scores.  Learning Barriers/Preferences:   General Cardiac Education Topics:  AED/CPR: - Group verbal and written instruction with the use of models to demonstrate the basic use of the AED with the basic ABC's of resuscitation.   Anatomy and Cardiac Procedures: - Group verbal and visual presentation and models provide information about basic cardiac anatomy and function. Reviews the testing methods done to diagnose heart disease and the outcomes of the test results. Describes the treatment choices: Medical Management, Angioplasty, or Coronary Bypass Surgery for treating various heart conditions including Myocardial Infarction, Angina, Valve Disease, and Cardiac Arrhythmias.  Written material given at  graduation. Flowsheet Row Cardiac Rehab  from 12/18/2023 in [facility] Cardiac and Pulmonary Rehab  Education need identified 12/18/23    Medication Safety: - Group verbal and visual instruction to review commonly prescribed medications for heart and lung disease. Reviews the medication, class of the drug, and side effects. Includes the steps to properly store meds and maintain the prescription regimen.  Written material given at graduation.   Intimacy: - Group verbal instruction through game format to discuss how heart and lung disease can affect sexual intimacy. Written material given at graduation.. Flowsheet Row Cardiac Rehab from 12/18/2023 in [facility] Cardiac and Pulmonary Rehab  Education need identified 12/18/23    Know Your Numbers and Heart Failure: - Group verbal and visual instruction to discuss disease risk factors for cardiac and pulmonary disease and treatment options.  Reviews associated critical values for Overweight/Obesity, Hypertension, Cholesterol, and Diabetes.  Discusses basics of heart failure: signs/symptoms and treatments.  Introduces Heart Failure Zone chart for action plan for heart failure.  Written material given at graduation.   Infection Prevention: - Provides verbal and written material to individual with discussion of infection control including proper hand washing and proper equipment cleaning during exercise session. Flowsheet Row Cardiac Rehab from 12/18/2023 in [facility] Cardiac and Pulmonary Rehab  Date 12/18/23  Educator MB  Instruction Review Code 1- Verbalizes Understanding    Falls Prevention: - Provides verbal and written material to individual with discussion of falls prevention and safety. Flowsheet Row Cardiac Rehab from 12/18/2023 in [facility] Cardiac and Pulmonary Rehab  Date 12/18/23  Educator MB  Instruction Review Code 1- Verbalizes Understanding    Other: -Provides group and verbal instruction on various topics (see comments)   Knowledge Questionnaire Score:  Knowledge Questionnaire Score  - 12/18/23 1511       Knowledge Questionnaire Score   Pre Score 20/26          Core Components/Risk Factors/Patient Goals at Admission:  Personal Goals and Risk Factors at Admission - 12/18/23 1511       Core Components/Risk Factors/Patient Goals on Admission    Weight Management Yes;Weight Maintenance    Intervention Weight Management: Develop a combined nutrition and exercise program designed to reach desired caloric intake, while maintaining appropriate intake of nutrient and fiber, sodium and fats, and appropriate energy expenditure required for the weight goal.;Weight Management: Provide education and appropriate resources to help participant work on and attain dietary goals.;Weight Management/Obesity: Establish reasonable short term and long term weight goals.    Admit Weight 174 lb 1.6 oz (79 kg)    Goal Weight: Short Term 174 lb (78.9 kg)    Goal Weight: Long Term 174 lb (78.9 kg)    Expected Outcomes Short Term: Continue to assess and modify interventions until short term weight is achieved;Weight Maintenance: Understanding of the daily nutrition guidelines, which includes 25-35% calories from fat, 7% or less cal from saturated fats, less than 200mg  cholesterol, less than 1.5gm of sodium, & 5 or more servings of fruits and vegetables daily;Understanding recommendations for meals to include 15-35% energy as protein, 25-35% energy from fat, 35-60% energy from carbohydrates, less than 200mg  of dietary cholesterol, 20-35 gm of total fiber daily;Understanding of distribution of calorie intake throughout the day with the consumption of 4-5 meals/snacks;Long Term: Adherence to nutrition and physical activity/exercise program aimed toward attainment of established weight goal    Heart Failure Yes    Intervention Provide a combined exercise and nutrition program that is supplemented with education, support and counseling about  heart failure. Directed toward relieving symptoms such as shortness  of breath, decreased exercise tolerance, and extremity edema.    Expected Outcomes Improve functional capacity of life;Short term: Attendance in program 2-3 days a week with increased exercise capacity. Reported lower sodium intake. Reported increased fruit and vegetable intake. Reports medication compliance.;Long term: Adoption of self-care skills and reduction of barriers for early signs and symptoms recognition and intervention leading to self-care maintenance.;Short term: Daily weights obtained and reported for increase. Utilizing diuretic protocols set by physician.    Hypertension Yes    Intervention Provide education on lifestyle modifcations including regular physical activity/exercise, weight management, moderate sodium restriction and increased consumption of fresh fruit, vegetables, and low fat dairy, alcohol moderation, and smoking cessation.;Monitor prescription use compliance.    Expected Outcomes Short Term: Continued assessment and intervention until BP is < 140/25mm HG in hypertensive participants. < 130/108mm HG in hypertensive participants with diabetes, heart failure or chronic kidney disease.;Long Term: Maintenance of blood pressure at goal levels.    Lipids Yes    Intervention Provide education and support for participant on nutrition & aerobic/resistive exercise along with prescribed medications to achieve LDL 70mg , HDL >40mg .    Expected Outcomes Short Term: Participant states understanding of desired cholesterol values and is compliant with medications prescribed. Participant is following exercise prescription and nutrition guidelines.;Long Term: Cholesterol controlled with medications as prescribed, with individualized exercise RX and with personalized nutrition plan. Value goals: LDL < 70mg , HDL > 40 mg.          Education:Diabetes - Individual verbal and written instruction to review signs/symptoms of diabetes, desired ranges of glucose level fasting, after meals and with  exercise. Acknowledge that pre and post exercise glucose checks will be done for 3 sessions at entry of program.   Core Components/Risk Factors/Patient Goals Review:   Goals and Risk Factor Review     Row Name 04/01/24 1118 05/04/24 1130           Core Components/Risk Factors/Patient Goals Review   Personal Goals Review Weight Management/Obesity;Hypertension Hypertension      Review Detric states that he does not take his BP at home very frequently. His BP has been within a healthy range on program days, today he measured 118/68. Nakai is still maintaining his weight, he state that his wife manages his diet. Djuan was asked if he was checking his BP at home again. He does not recall doing so. He was encouraged to start doing so.      Expected Outcomes Short: Check BP at home more regularly. Long: Continue to manage risk factors. Short: Check BP at home more regularly. Long: Continue to manage risk factors.         Core Components/Risk Factors/Patient Goals at Discharge (Final Review):   Goals and Risk Factor Review - 05/04/24 1130       Core Components/Risk Factors/Patient Goals Review   Personal Goals Review Hypertension    Review Nikolaus was asked if he was checking his BP at home again. He does not recall doing so. He was encouraged to start doing so.    Expected Outcomes Short: Check BP at home more regularly. Long: Continue to manage risk factors.          ITP Comments:  ITP Comments     Row Name 12/17/23 1320 12/18/23 1501 12/23/23 1128 01/15/24 0727 02/12/24 0757   ITP Comments Initial phone call completed. Diagnosis can be found in [facility] 12/12[facility]. EP Orientation scheduled for Wednesday 1/15  at 1pm. Completed and gym orientation. Initial ITP created and sent for review to Dr. Oneil Pinal, Medical Director. First full day of exercise!  Patient was oriented to gym and equipment including functions, settings, policies, and procedures.  Patient's individual exercise  prescription and treatment plan were reviewed.  All starting workloads were established based on the results of the 6 minute walk test done at initial orientation visit.  The plan for exercise progression was also introduced and progression will be customized based on patient's performance and goals. 30 Day review completed. Medical Director ITP review done, changes made as directed, and signed approval by Medical Director.    new to program 30 Day review completed. Medical Director ITP review done, changes made as directed, and signed approval by Medical Director.    Row Name 03/11/24 0732 04/08/24 0827 05/06/24 0915 06/03/24 0753     ITP Comments 30 Day review completed. Medical Director ITP review done, changes made as directed, and signed approval by Medical Director. 30 Day review completed. Medical Director ITP review done, changes made as directed, and signed approval by Medical Director. 30 Day review completed. Medical Director ITP review done, changes made as directed, and signed approval by Medical Director. 30 Day review completed. Medical Director ITP review done, changes made as directed, and signed approval by Medical Director.       Comments: 30 day review

## 2024-06-03 NOTE — Progress Notes (Signed)
 30 Day review completed. Medical Director ITP review done, changes made as directed, and signed approval by Medical Director. ? ?

## 2024-06-08 NOTE — Progress Notes (Unsigned)
 Cardiology Office Note    Date:  06/09/2024   ID:  Zachary Camp Armand Raddle., DOB 1933-08-25, MRN [id number]  PCP:  Auston Reyes BIRCH, MD  Cardiologist:  Evalene Lunger, MD  Electrophysiologist:  None   Chief Complaint: Follow up  History of Present Illness:   Ryle Buscemi. is a 89 y.o. male with history of CAD status post PCI with Taxis stent to the RCA in 2007, HFrEF, PAF diagnosed in 08/2023 with consideration for Watchman, recently diagnosed of atrial flutter in 05/2024, severe aortic stenosis status post TAVR at [facility] on 10/23/2023, PSVT, CVA in 2018 with imaging showing acute left-sided parieto-occipital intraparenchymal hematoma with adjacent subarachnoid hemorrhage felt to represent hemorrhagic transformation of embolic infarct, bleeding ulcer in 01/2023 requiring 2 units PRBC, syncope in 2021 of unclear etiology, mechanical falls, carotid artery stenosis, HTN, and HLD who presents for follow-up of atrial flutter and lower extremity swelling.  He was admitted in 2018 with CVA.  Subsequent prolonged outpatient cardiac monitoring showed no evidence of A-fib at that time with some episodes of tachycardic and bradycardic rates.  Echo from 02/2020 showed an EF of 55% with no regional wall motion abnormalities and grade 1 diastolic dysfunction.  There was mild to moderate aortic valve sclerosis with borderline mild aortic stenosis at that time.  Lexiscan  MPI in 03/2020 showed no significant ischemia and was overall low risk.  Repeat outpatient cardiac monitoring in 01/2022 showed sinus rhythm with 156 episodes of SVT lasting up to 19 beats as well as an idioventricular rhythm, frequent PACs, and rare ventricular ectopy.  Repeat outpatient cardiac monitoring in 03/2023 showed a predominant rhythm of sinus with an average rate of 61 bpm with a range of 38 to 146 bpm, 1 run of NSVT lasting 9 beats, 408 episodes of SVT lasting up to 25.4 seconds, frequent PACs with a burden of 7.9%, occasional  atrial couplets with a burden of 1.4%, occasional PVCs with a burden of 1.8%.   He was admitted to the hospital in 06/2023 with NSTEMI with troponin trending to 1300.  Cardiac cath showed severe multivessel CAD with no clear culprit as outlined below.  He was not felt to be a candidate for CABG.  Echo in 06/2023 showed an EF of 35 to 40% with moderate aortic stenosis with a mean gradient of 16 mmHg, peak gradient 25 mmHg, and an estimated valve area 0.8 cm with low-flow, low gradient.  He was later found to be in A-fib by EKG in late 08/2023.  In this setting he was evaluated for watchman at [facility] with recommendation to undergo repair of aortic stenosis initially.  Echo at [facility] in 09/2023 showed EF unchanged at 35% with severe aortic stenosis with a mean gradient 21 mmHg and peak gradient 35 mmHg along with moderate mitral regurgitation.  Plan was to proceed with TAVR.  He underwent TAVR at [facility] on 10/23/2023 with procedure complicated by complete heart block requiring temporary wire without permanent pacemaker implantation needed.  Post-procedure limited echo showed normal functioning aortic valve prosthesis.  More recently, he was treated for URI/bronchitis after traveling to the [facility] and given antibiotics by outside primary MD.  He was last seen in our office in 05/2024 and reported increased shortness of breath with lower extremity swelling and had been started on Lasix .  His weight was up approximately 6 to 10 pounds from baseline.  EKG in the office showed new onset atrial flutter.  He was continued on a reduced dose of  apixaban  2.5 mg daily (note indicates dose reduced by [facility] due to advanced age, prior GI bleed, and with plans for possible Watchman evaluation).  It was also recommended he increase Lasix  to 20 mg twice daily with KCl repletion.  He comes in accompanied by his wife today who provides some of the of the history due to patient's underlying cognitive decline.  She reports there has not  been any significant change in the patient's lower extremity swelling on furosemide  20 mg twice daily.  He has not complained of shortness of breath or orthopnea.  His weight is down 6 pounds today when compared to his visit on 05/25/2024.  His wife also notes an improvement in early satiety on furosemide .  He has not complained of chest pain, dizziness, presyncope, or syncope.  No falls or symptoms concerning for bleeding.  He is not aware of palpitations.   Labs independently reviewed: 05/2024 - potassium 4.6, BUN 20, serum creatinine 1.5 magnesium 2.3, proBNP 5090, AST/ALT normal, Hgb 14.1, PLT 192 03/2024 - TC 111, TG 41, HDL 53, LDL 49, TSH normal, A1c 5.8  Past Medical History:  Diagnosis Date   Acute embolic stroke (HCC) 02/01/2017   BPH (benign prostatic hyperplasia)    Bradycardia 11/10/2015   Coronary artery disease    Dizziness 08/21/2012   DYSPEPSIA 07/31/2010   Qualifier: Diagnosis of  By: Sebastian RN, Rosina     Hyperlipidemia 05/16/2010   Qualifier: Diagnosis of  By: Perla MD, [name]      Hypertension    HYPERTENSION, BENIGN 05/16/2010   Qualifier: Diagnosis of  By: Perla MD, [name]     Stenosis of right carotid artery 02/01/2017    Past Surgical History:  Procedure Laterality Date   ESOPHAGOGASTRODUODENOSCOPY (EGD) WITH PROPOFOL  N/A 01/08/2023   Procedure: ESOPHAGOGASTRODUODENOSCOPY (EGD) WITH PROPOFOL ;  Surgeon: Jinny Carmine, MD;  Location: [facility] ENDOSCOPY;  Service: Endoscopy;  Laterality: N/A;   HIP SURGERY     right hip    Intestinal Blockage     LEFT HEART CATH AND CORONARY ANGIOGRAPHY N/A 06/15/2023   Procedure: LEFT HEART CATH AND CORONARY ANGIOGRAPHY;  Surgeon: Anner Alm ORN, MD;  Location: [facility] INVASIVE CV LAB;  Service: Cardiovascular;  Laterality: N/A;   LOOP RECORDER INSERTION N/A 05/14/2017   Procedure: Loop Recorder Insertion;  Surgeon: Fernande Elspeth BROCKS, MD;  Location: [facility] INVASIVE CV LAB;  Service: Cardiovascular;  Laterality: N/A;   SKIN CANCER EXCISION     x2    TONSILLECTOMY      Current Medications: Current Meds  Medication Sig   acetaminophen  (TYLENOL ) 650 MG CR tablet Take 650 mg by mouth every 8 (eight) hours as needed.    apixaban  (ELIQUIS ) 2.5 MG TABS tablet Take 1 tablet (2.5 mg total) by mouth 2 (two) times daily.   Ascorbic Acid (VITAMIN C ) 1000 MG tablet Take 1,000 mg by mouth daily.   atorvastatin  (LIPITOR) 20 MG tablet Take 20 mg by mouth at bedtime.   b complex vitamins tablet Take 1 tablet by mouth daily.   cholecalciferol  (VITAMIN D ) 1000 units tablet Take 2,000 Units by mouth daily.   clopidogrel  (PLAVIX ) 75 MG tablet Take 1 tablet (75 mg total) by mouth daily.   cyanocobalamin  (VITAMIN B12) 1000 MCG tablet Take 1,000 mcg by mouth daily.   docusate sodium  (COLACE) 100 MG capsule Take 100 mg by mouth 2 (two) times daily.   empagliflozin (JARDIANCE) 25 MG TABS tablet Take 12.5 mg by mouth.   ezetimibe  (ZETIA ) 10 MG tablet Take  1 tablet (10 mg total) by mouth daily.   FLAXSEED, LINSEED, PO Take 2 g by mouth daily.   furosemide  (LASIX ) 20 MG tablet Take 1 tablet (20 mg total) by mouth 2 (two) times daily.   metoprolol  succinate (TOPROL -XL) 25 MG 24 hr tablet Take 12.5 mg by mouth daily.   nitroGLYCERIN  (NITROSTAT ) 0.4 MG SL tablet Place 1 tablet (0.4 mg total) under the tongue every 5 (five) minutes x 3 doses as needed for chest pain.   pantoprazole  (PROTONIX ) 40 MG tablet Take 1 tablet (40 mg total) by mouth 2 (two) times daily.   potassium chloride  (KLOR-CON ) 10 MEQ tablet Take 1 tablet (10 mEq total) by mouth 2 (two) times daily.   pregabalin  (LYRICA ) 150 MG capsule Take 150 mg by mouth at bedtime.   Probiotic Product (UP4 PROBIOTICS PO) Take 7.5 mg by mouth.   Psyllium (GERI-MUCIL) 25 % POWD Take by mouth.   ranolazine  (RANEXA ) 500 MG 12 hr tablet Take 1 tablet (500 mg total) by mouth 2 (two) times daily.   tamsulosin  (FLOMAX ) 0.4 MG CAPS capsule Take 2 capsules (0.8 mg total) by mouth daily.   zinc  sulfate 220 (50 Zn) MG capsule  Take 1 tablet by mouth daily.    Allergies:   Penicillins, Gabapentin, Penicillin g, and Codeine   Social History   Socioeconomic History   Marital status: Married    Spouse name: Not on file   Number of children: Not on file   Years of education: Not on file   Highest education level: Not on file  Occupational History   Not on file  Tobacco Use   Smoking status: Never   Smokeless tobacco: Never  Vaping Use   Vaping status: Never Used  Substance and Sexual Activity   Alcohol use: Not Currently    Comment: occassionally   Drug use: No   Sexual activity: Not on file  Other Topics Concern   Not on file  Social History Narrative   Not on file   Social Drivers of Health   Financial Resource Strain: Not on file  Food Insecurity: No Food Insecurity (06/17/2023)   Hunger Vital Sign    Worried About Running Out of Food in the Last Year: Never true    Ran Out of Food in the Last Year: Never true  Transportation Needs: No Transportation Needs (10/23/2023)   Received from [facility] - Transportation    In the past 12 months, has lack of transportation kept you from medical appointments or from getting medications?: No    Lack of Transportation (Non-Medical): No  Physical Activity: Not on file  Stress: Not on file  Social Connections: Not on file     Family History:  The patient's family history includes Cancer in an other family member; Hyperlipidemia in an other family member; Hypertension in his mother and another family member; Other in an other family member.  ROS:   12-point review of systems is negative unless otherwise noted in the HPI.   EKGs/Labs/Other Studies Reviewed:    Studies reviewed were summarized above. The additional studies were reviewed today:  Limited echo 10/23/2023 ([facility]): CONCLUSION -------------------------------------------------------------------------------  LEFT VENTRICULAR SYSTOLIC FUNCTION NOT ASSESSED LVH Not  Assessed  ESTIMATED EF: N/A  DIASTOLIC FUNCTION NOT ASSESSED  RIGHT VENTRICULAR SYSTOLIC FUNCTION NOT ASSESSED  VALVULAR REGURGITATION: No AR  VALVULAR STENOSIS: TAVR  ------------------------------------------------------------------------------------------  S/P TAVR  WELL SEATED 26mm TRANSCATH RESILIA ULTRA THV CM  NO OBVIOUS AORTIC  REGURGITATION  __________   2D echo 09/05/2023 ([facility]): ECHOCARDIOGRAPHIC DESCRIPTIONS -----------------------------------------------  AORTIC ROOT          Size: Normal    Dissection: INDETERM FOR DISSECTION   AORTIC VALVE      Leaflets: Tricuspid             Morphology: SEVERELY THICKENED      Mobility: PARTIALLY MOBILE   LEFT VENTRICLE                                      Anterior: HYPOCONTRACTILE          Size: Normal                                 Lateral: HYPOCONTRACTILE   Contraction: MOD GLOBAL DECREASE                     Septal: HYPOCONTRACTILE    Closest EF: 35% (Estimated)  Calc.EF: 36% (2D)      Apical: HYPOCONTRACTILE     LV masses: No Masses                             Inferior: HYPOCONTRACTILE           LVH: MILD LVH                             Posterior: HYPOCONTRACTILE  Dias.FxClass: INDETERMINATE   MITRAL VALVE      Leaflets: Normal                  Mobility: Fully mobile    Morphology: Normal   LEFT ATRIUM          Size: SEVERELY ENLARGED     LA masses: No masses                Normal IAS   MAIN PA          Size: Not seen   PULMONIC VALVE    Morphology: Normal      Mobility: Fully Mobile   RIGHT VENTRICLE          Size: MILDLY ENLARGED           Free wall: Normal   Contraction: Normal                    RV masses: No Masses         TAPSE:   1.2 cm,  Normal Range [>= 1.6 cm]   TRICUSPID VALVE      Leaflets: Normal                  Mobility: Fully mobile    Morphology: Normal   RIGHT ATRIUM          Size: MILDLY ENLARGED            RA Other: None     RA masses: No masses   PERICARDIUM        Fluid: No effusion    INFERIOR VENACAVA          Size: Normal     ABNORMAL RESPIRATORY COLLAPSE   DOPPLER ECHO and OTHER SPECIAL PROCEDURES ------------------------------------     Aortic: MILD AR  SEVERE AS      3.0 m/s peak vel   35 mmHg peak grad  21 mmHg mean grad 0.7 cm2 by DOPPLER   LVOT Diam: 2.0 cm. Resting LVOT Vel: 0.7 m/s. Dimensionless Index: 0.23      Mitral: MODERATE MR            No MS     MV Inflow E Vel.= nm* cm/s  MV Annulus E'Vel.= nm* cm/s  E/E'Ratio= nm*   Tricuspid: MILD TR                No TS             2.8 m/s peak TR vel   40 mmHg peak RV pressure   Pulmonary: No PR                  No PS       Other:             DEFINITY  CONTRAST SHOWS ENHANCED LV BORDERS   INTERPRETATION ---------------------------------------------------------------    MODERATE LV DYSFUNCTION (See above) WITH MILD LVH    NORMAL RIGHT VENTRICULAR SYSTOLIC FUNCTION    VALVULAR REGURGITATION: MILD AR, MODERATE MR, MILD TR    VALVULAR STENOSIS: SEVERE AS    MODERATE MR, EROA=0.2cm2, RVol=55mL    AVA (0.7cm2) AND DIMENSIONLESS INDEX (0.23) SUGGESTS SEVERE AORTIC    STENOSIS IN THE SETTING OF A LOW FLOW STATE INDICATED BY A STROKE VOLUME    INDEX OF 30 mL/m2    MILD TO MODERATE AR     Compared with prior Echo study on 04/11/2023: REDUCED LV FUNCTION  __________   2D echo 06/16/2023: 1. Findings suggestive of LCX territory ischemia/infarct. Left  ventricular ejection fraction, by estimation, is 35 to 40%. Left  ventricular ejection fraction by PLAX is 35 %. The left ventricle has  moderately decreased function. The left ventricle  demonstrates regional wall motion abnormalities (see scoring  diagram/findings for description). Left ventricular diastolic parameters  are indeterminate. There is severe akinesis of the left ventricular,  basal-mid inferior wall, inferolateral wall and  lateral wall.   2. Right ventricular systolic function is mildly reduced. The right  ventricular size is  normal. There is normal pulmonary artery systolic  pressure. The estimated right ventricular systolic pressure is 33.8 mmHg.   3. Left atrial size was moderately dilated.   4. The mitral valve is abnormal. Mild mitral valve regurgitation.   5. The aortic valve is tricuspid. Aortic valve regurgitation is not  visualized. Low flow, decreased gradient moderate aortic valve stenosis.  Aortic valve area, by VTI measures 0.82 cm. Aortic valve mean gradient  measures 16.0 mmHg. Aortic valve Vmax  measures 2.50 m/s. Peak gradient 16 mmHg, DI is 0.29.   6. The inferior vena cava is dilated in size with <50% respiratory  variability, suggesting right atrial pressure of 15 mmHg.   Comparison(s): Changes from prior study are noted. 02/25/2020: LVEF 55%,  borderline mild AS -mean gradient 10 mmgHg.  __________   [facility] 06/15/2023:   Ost Cx to Prox Cx lesion is 100% stenosed -> the following circumflex fills berry but appears to be bridging collaterals.  Prox Cx to Mid Cx lesion is 80% stenosed.   Ost RCA to Prox RCA lesion is 95% stenosed.   Prox RCA stent has 55% in-stent restenosis   Prox RCA to Mid RCA lesion is 99% stenosed. Mid RCA lesion is 70% stenosed.   Dist RCA lesion is 99% stenosed.  TIMI I flow to the distal vessel.   1st Diag lesion is 55% stenosed.   Mid LAD lesion is 65% stenosed.   -----------------------------------   There is mild to moderate left ventricular systolic dysfunction.  The left ventricular ejection fraction is 35-45% by visual estimate. Basal to mid inferior akinesis with inferolateral hypokinesis.  LV end diastolic pressure is mildly elevated.   There is mild aortic valve stenosis.   -----------------------------------   Upon bed availability, the patient will be transferred to [facility] -> he will be placed under the Team C service   Plan will be to restart IV heparin  pending decision about appropriate intervention.  Will need to determine the risks of adding  antiplatelet agent to his recently started Eliquis  based on his history of stroke and intracerebral hemorrhage.   POST-CATH DIAGNOSES Severe multivessel CAD with unclear culprit lesion:  Patient was chest pain-free, and hemodynamically stable upon completion of the catheterization.  Ostial-proximal LCx subtotal 99 to 100% occlusion with <TIMI I flow Mild diffuse proximal LAD disease with 70% stenosis after short mid vessel diagonal branch, the large Ramus-like first diagonal with focal 50% stenosis Heavily calcified ostial RCA (very difficult to engage) 95% stenosis followed by diffuse 40 to 60% ISR of the proximal to mid vessel stent followed by 99 % subtotal occlusion after the stent crossing major RV marginal branch. Brisk collaterals fill the PDA   Moderate reduced EF with basal to mid inferior essentially akinesis and inferolateral hypokinesis. With an EDP of 17 mmHg.   RECOMMENDATIONS Admit and restart IV heparin  6 hours after Mynx closure High-dose high intensity statin No beta-blocker because history of tachybradycardia With the extent of disease he has, and either the circumflex or the RCA lesion being somewhat difficult I chose to delay attempted intervention.  Would recommend Transfer to [facility] to discuss possible atherectomy PCI of the RCA plus or minus attempting PCI of the circumflex.  Will need discussion between IC cardiologist and possibly CVTS given the fact that there does appear to be LAD lesion as well. As he was chest pain-free and hemodynamically stable, I did not place balloon pump. __________   2D echo 04/11/2023 [facility]): INTERPRETATION  NORMAL LEFT VENTRICULAR SYSTOLIC FUNCTION  NORMAL RIGHT VENTRICULAR SYSTOLIC FUNCTION  MODERATE MITRAL VALVE INSUFFICIENCY  MILD TRICUSPID AND AORTIC VALVE INSUFFICIENCY  MILD-TO-MODERATE AORTIC VALVE STENOSIS  GLS: -15.0 %  __________   Zio patch 03/2023: Normal sinus rhythm Patient had a min HR of 38 bpm, max HR  of 146 bpm, and avg HR of 61 bpm.   1 run of Ventricular Tachycardia occurred lasting 9 beats with a max rate of 125 bpm (avg 107 bpm).    408 Supraventricular Tachycardia runs occurred, the run with the fastest interval lasting 5 beats with a max rate of 146 bpm, the longest lasting 25.4 secs with an avg rate of 110 bpm.    Isolated SVEs were frequent (7.9%, 84016), SVE Couplets were occasional (1.4%, 7569), and SVE Triplets were rare (<1.0%, 2046).  Isolated VEs were occasional (1.8%, 19205), VE Couplets were rare (<1.0%, 82), and no VE Triplets were present.  Ventricular Bigeminy and Trigeminy were present.    No patient triggered events recorded __________   Zio patch 02/2023: Normal sinus rhythm Patient had a min HR of 38 bpm, max HR of 135 bpm, and avg HR of 59 bpm.   156 Supraventricular Tachycardia/atrial tachycardia runs occurred, the run with the fastest interval lasting 8 beats with  a max rate of 135 bpm, the longest lasting 19 beats with an avg rate of 100 bpm.   Idioventricular Rhythm was present. Isolated SVEs were frequent (12.4%, [id number]), SVE Couplets were occasional (4.3%, 21968), and SVE Triplets were occasional (1.5%, 5187).   Isolated VEs were rare (<1.0%, 4920), VE Couplets were rare (<1.0%, 163), and VE Triplets were rare (<1.0%, 3). Ventricular Bigeminy and Trigeminy were present.   1 patient triggered event recorded associated with normal sinus rhythm and PACs __________   See CV studies for more remote imaging   EKG:  EKG is ordered today.  The EKG ordered today demonstrates Atrial flutter with variable AV block, 73 bpm, LBBB  Recent Labs: 06/26/2023: B Natriuretic Peptide 460.1 06/09/2024: ALT 16; BUN 26; Creatinine, Ser 1.28; Hemoglobin 12.7; Platelets 106; Potassium 3.9; Sodium 134  Recent Lipid Panel    Component Value Date/Time   CHOL 90 06/16/2023 0154   TRIG 30 06/16/2023 0154   TRIG 93 01/23/2010 0000   HDL 40 (L) 06/16/2023 0154   CHOLHDL 2.3  06/16/2023 0154   VLDL 6 06/16/2023 0154   LDLCALC 44 06/16/2023 0154    PHYSICAL EXAM:    VS:  BP 120/60 (BP Location: Left Arm, Patient Position: Sitting, Cuff Size: Normal)   Pulse 73   Ht 5' 8 (1.727 m)   Wt 174 lb 9.6 oz (79.2 kg)   SpO2 98%   BMI 26.55 kg/m   BMI: Body mass index is 26.55 kg/m.  Physical Exam Vitals reviewed.  Constitutional:      Appearance: He is well-developed.  HENT:     Head: Normocephalic and atraumatic.  Eyes:     General:        Right eye: No discharge.        Left eye: No discharge.  Neck:     Vascular: JVD present.  Cardiovascular:     Rate and Rhythm: Normal rate. Rhythm irregularly irregular.     Heart sounds: Normal heart sounds, S1 normal and S2 normal. Heart sounds not distant. No midsystolic click and no opening snap. No murmur heard.    No friction rub.  Pulmonary:     Effort: Pulmonary effort is normal. No respiratory distress.     Breath sounds: Normal breath sounds. No decreased breath sounds, wheezing, rhonchi or rales.  Chest:     Chest wall: No tenderness.  Abdominal:     General: There is distension.     Palpations: Abdomen is soft.     Tenderness: There is no abdominal tenderness.  Musculoskeletal:     Cervical back: Normal range of motion.     Right lower leg: Edema present.     Left lower leg: Edema present.     Comments: 1-2+ bilateral lower extremity pitting edema to the mid shins.  Skin:    General: Skin is warm and dry.     Nails: There is no clubbing.  Neurological:     Mental Status: He is alert and oriented to person, place, and time.  Psychiatric:        Speech: Speech normal.        Behavior: Behavior normal.        Thought Content: Thought content normal.        Judgment: Judgment normal.     Wt Readings from Last 3 Encounters:  06/09/24 174 lb 9.6 oz (79.2 kg)  05/25/24 180 lb 8 oz (81.9 kg)  04/22/24 174 lb 1.6 oz (79 kg)  ASSESSMENT & PLAN:   Acute on chronic HFrEF: He continues to be  volume up with noted abdominal distention and lower extremity swelling, possibly exacerbated by recently diagnosed atrial flutter.  His wife has not noted a significant increase in urine output on furosemide  20 mg twice daily.  His weight is down 6 pounds today when compared to his visit on 05/25/2024.  Obtain stat CMP to trend renal function with likely plan to transition from furosemide  to torsemide  and evaluate albumin level.  He is also on Lyrica  which has a common side effect of peripheral edema, though has been on this medication for 10 to 15 years without adverse effect.  Obtain echo to evaluate for progressive cardiomyopathy.  PAF/atrial flutter/PSVT/tachybradycardia syndrome: Remains in atrial flutter with controlled ventricular response.  Likely contributing to volume overload.  We discussed continued rate control for atrial flutter versus pursuing rhythm control.  However, there is reservation in considering cardioversion given that the last time the patient had sedation there was further cognitive decline.  We would also need to pursue TEE prior to cardioversion given the patient has been maintained on apixaban  2.5 mg twice daily and historically has not met requirements for reduced dosing anticoagulation (he does on most recent labs obtained on 06/01/2024).  His wife prefers that we continue with symptom control with diuresis and pursue rate control if able in an effort to hopefully avoid sedation.  CHA2DS2-VASc at least 7 (CHF, HTN, age x 2, vascular disease, CVA x 2).  He remains on apixaban  2.5 mg twice daily in the setting of advanced age, and prior intracranial GI bleeds.  Discussed with primary cardiologist who recommends we maintain the patient on lower dose of apixaban .  He was previously being evaluated for possible Watchman at outside facility.  Check renal function and CBC.  CAD involving the native coronary arteries without angina: He is without symptoms concerning for angina.  Continue  aggressive risk factor modification and secondary prevention including epidural as outlined below, atorvastatin  20 mg, ezetimibe  10 mg, and ranolazine  500 mg twice daily.  No longer on beta-blocker secondary to bradycardic rates noted on prior cardiac monitoring.  No plans for ischemic testing at this time.  Severe aortic stenosis: Status post TAVR in 10/2023 at [facility].  Obtain echo as outlined above.  He is on apixaban  as outlined above.  SBE prophylaxis is indicated for dental procedures.  HTN: Blood pressure is well-controlled in the office today.  He remains on Toprol -XL 12.5 mg daily.  HLD: LDL 49 in 03/2024.  He remains on atorvastatin  20 mg and ezetimibe  10 mg.  History of CVA: No new deficits.  On clopidogrel  and apixaban  as well as atorvastatin  and ezetimibe .      Disposition: F/u with Dr. Gollan or an APP in 2-3 weeks.   Medication Adjustments/Labs and Tests Ordered: Current medicines are reviewed at length with the patient today.  Concerns regarding medicines are outlined above. Medication changes, Labs and Tests ordered today are summarized above and listed in the Patient Instructions accessible in Encounters.   Signed, Bernardino Bring, PA-C 06/09/2024 5:09 PM     [facility] -  [address] [place], [place] [address] [phone]

## 2024-06-09 ENCOUNTER — Ambulatory Visit: Attending: Physician Assistant | Admitting: Physician Assistant

## 2024-06-09 ENCOUNTER — Ambulatory Visit: Payer: Self-pay | Admitting: Physician Assistant

## 2024-06-09 ENCOUNTER — Encounter: Payer: Self-pay | Admitting: Physician Assistant

## 2024-06-09 ENCOUNTER — Other Ambulatory Visit
Admission: RE | Admit: 2024-06-09 | Discharge: 2024-06-09 | Disposition: A | Discharge status: Home / Self Care | Source: Ambulatory Visit | Attending: Physician Assistant | Admitting: Physician Assistant

## 2024-06-09 VITALS — BP 120/60 | HR 73 | Ht 68.0 in | Wt 174.6 lb

## 2024-06-09 DIAGNOSIS — Z79899 Other long term (current) drug therapy: Secondary | ICD-10-CM | POA: Insufficient documentation

## 2024-06-09 DIAGNOSIS — I471 Supraventricular tachycardia, unspecified: Secondary | ICD-10-CM | POA: Insufficient documentation

## 2024-06-09 DIAGNOSIS — I5023 Acute on chronic systolic (congestive) heart failure: Secondary | ICD-10-CM | POA: Diagnosis not present

## 2024-06-09 DIAGNOSIS — I10 Essential (primary) hypertension: Secondary | ICD-10-CM | POA: Insufficient documentation

## 2024-06-09 DIAGNOSIS — I2510 Atherosclerotic heart disease of native coronary artery without angina pectoris: Secondary | ICD-10-CM | POA: Diagnosis present

## 2024-06-09 DIAGNOSIS — I350 Nonrheumatic aortic (valve) stenosis: Secondary | ICD-10-CM | POA: Insufficient documentation

## 2024-06-09 DIAGNOSIS — E785 Hyperlipidemia, unspecified: Secondary | ICD-10-CM | POA: Diagnosis present

## 2024-06-09 DIAGNOSIS — Z8673 Personal history of transient ischemic attack (TIA), and cerebral infarction without residual deficits: Secondary | ICD-10-CM | POA: Insufficient documentation

## 2024-06-09 DIAGNOSIS — I4892 Unspecified atrial flutter: Secondary | ICD-10-CM | POA: Insufficient documentation

## 2024-06-09 DIAGNOSIS — I480 Paroxysmal atrial fibrillation: Secondary | ICD-10-CM | POA: Diagnosis present

## 2024-06-09 DIAGNOSIS — I495 Sick sinus syndrome: Secondary | ICD-10-CM | POA: Diagnosis present

## 2024-06-09 LAB — COMPREHENSIVE METABOLIC PANEL WITH GFR
ALT: 16 U/L (ref 0–44)
AST: 25 U/L (ref 15–41)
Albumin: 3.8 g/dL (ref 3.5–5.0)
Alkaline Phosphatase: 78 U/L (ref 38–126)
Anion gap: 12 (ref 5–15)
BUN: 26 mg/dL — ABNORMAL HIGH (ref 8–23)
CO2: 27 mmol/L (ref 22–32)
Calcium: 9.1 mg/dL (ref 8.9–10.3)
Chloride: 95 mmol/L — ABNORMAL LOW (ref 98–111)
Creatinine, Ser: 1.28 mg/dL — ABNORMAL HIGH (ref 0.61–1.24)
GFR, Estimated: 54 mL/min — ABNORMAL LOW (ref >60)
Glucose, Bld: 112 mg/dL — ABNORMAL HIGH (ref 70–99)
Potassium: 3.9 mmol/L (ref 3.5–5.1)
Sodium: 134 mmol/L — ABNORMAL LOW (ref 135–145)
Total Bilirubin: 2.4 mg/dL — ABNORMAL HIGH (ref 0.0–1.2)
Total Protein: 6.6 g/dL (ref 6.5–8.1)

## 2024-06-09 LAB — CBC
HCT: 38.9 % — ABNORMAL LOW (ref 39.0–52.0)
Hemoglobin: 12.7 g/dL — ABNORMAL LOW (ref 13.0–17.0)
MCH: 29.7 pg (ref 26.0–34.0)
MCHC: 32.6 g/dL (ref 30.0–36.0)
MCV: 90.9 fL (ref 80.0–100.0)
Platelets: 106 K/uL — ABNORMAL LOW (ref 150–400)
RBC: 4.28 MIL/uL (ref 4.22–5.81)
RDW: 16.4 % — ABNORMAL HIGH (ref 11.5–15.5)
WBC: 5.6 K/uL (ref 4.0–10.5)
nRBC: 0 % (ref 0.0–0.2)

## 2024-06-09 MED ORDER — TORSEMIDE 20 MG PO TABS: ORAL_TABLET | ORAL | 3 refills | Status: DC

## 2024-06-09 MED ORDER — POTASSIUM CHLORIDE ER 10 MEQ PO TBCR
10.0000 meq | EXTENDED_RELEASE_TABLET | Freq: Two times a day (BID) | ORAL | 3 refills | Status: DC
Start: 2024-06-09 — End: 2024-06-18

## 2024-06-09 NOTE — Patient Instructions (Signed)
 Medication Instructions:  Your physician recommends that you continue on your current medications as directed. Please refer to the Current Medication list given to you today.   *If you need a refill on your cardiac medications before your next appointment, please call your pharmacy*  Lab Work: Your provider would like for you to have following labs drawn today at the [facility] CBC and CMeT.   If you have labs (blood work) drawn today and your tests are completely normal, you will receive your results only by: MyChart Message (if you have MyChart) OR A paper copy in the mail If you have any lab test that is abnormal or we need to change your treatment, we will call you to review the results.  Testing/Procedures: Your physician has requested that you have an echocardiogram. Echocardiography is a painless test that uses sound waves to create images of your heart. It provides your doctor with information about the size and shape of your heart and how well your heart's chambers and valves are working.   You may receive an ultrasound enhancing agent through an IV if needed to better visualize your heart during the echo. This procedure takes approximately one hour.  There are no restrictions for this procedure.  This will take place at [address] [facility] [address], [place] [address]  Please note: We ask at that you not bring children with you during ultrasound (echo/ vascular) testing. Due to room size and safety concerns, children are not allowed in the ultrasound rooms during exams. Our front office staff cannot provide observation of children in our lobby area while testing is being conducted. An adult accompanying a patient to their appointment will only be allowed in the ultrasound room at the discretion of the ultrasound technician under special circumstances. We apologize for any inconvenience.   Follow-Up: At [facility], you and your health needs are our  priority.  As part of our continuing mission to provide you with exceptional heart care, our providers are all part of one team.  This team includes your primary Cardiologist (physician) and Advanced Practice Providers or APPs (Physician Assistants and Nurse Practitioners) who all work together to provide you with the care you need, when you need it.  Your next appointment:   2-3 week(s)  Provider:   You may see Timothy Gollan, MD or Bernardino Bring, PA-C  We recommend signing up for the patient portal called MyChart.  Sign up information is provided on this After Visit Summary.  MyChart is used to connect with patients for Virtual Visits (Telemedicine).  Patients are able to view lab/test results, encounter notes, upcoming appointments, etc.  Non-urgent messages can be sent to your provider as well.   To learn more about what you can do with MyChart, go to [email or web address].

## 2024-06-11 ENCOUNTER — Ambulatory Visit

## 2024-06-15 ENCOUNTER — Telehealth: Payer: Self-pay | Admitting: Physician Assistant

## 2024-06-15 ENCOUNTER — Ambulatory Visit

## 2024-06-15 NOTE — Telephone Encounter (Signed)
 Wife is calling to ask if we sent orders to hospital for labs. Please Advise

## 2024-06-15 NOTE — Telephone Encounter (Signed)
 Returned call and spoke with pt's wife, Nena.  Reassured her that the order for the labs were pending for release and lab would be able to release them when they arrive [facility].  Reiterated that they did not need an appointment and to check in at registration desk.  She thanked me for checking on the order.

## 2024-06-17 ENCOUNTER — Ambulatory Visit

## 2024-06-17 ENCOUNTER — Other Ambulatory Visit
Admission: RE | Admit: 2024-06-17 | Discharge: 2024-06-17 | Disposition: A | Discharge status: Home / Self Care | Source: Ambulatory Visit | Attending: Physician Assistant | Admitting: Physician Assistant

## 2024-06-17 DIAGNOSIS — Z79899 Other long term (current) drug therapy: Secondary | ICD-10-CM | POA: Diagnosis present

## 2024-06-17 LAB — BASIC METABOLIC PANEL WITH GFR
Anion gap: 11 (ref 5–15)
BUN: 27 mg/dL — ABNORMAL HIGH (ref 8–23)
CO2: 29 mmol/L (ref 22–32)
Calcium: 8.8 mg/dL — ABNORMAL LOW (ref 8.9–10.3)
Chloride: 98 mmol/L (ref 98–111)
Creatinine, Ser: 1.5 mg/dL — ABNORMAL HIGH (ref 0.61–1.24)
GFR, Estimated: 45 mL/min — ABNORMAL LOW (ref >60)
Glucose, Bld: 100 mg/dL — ABNORMAL HIGH (ref 70–99)
Potassium: 3.4 mmol/L — ABNORMAL LOW (ref 3.5–5.1)
Sodium: 138 mmol/L (ref 135–145)

## 2024-06-17 LAB — CBC
HCT: 39.1 % (ref 39.0–52.0)
Hemoglobin: 12.9 g/dL — ABNORMAL LOW (ref 13.0–17.0)
MCH: 29.6 pg (ref 26.0–34.0)
MCHC: 33 g/dL (ref 30.0–36.0)
MCV: 89.7 fL (ref 80.0–100.0)
Platelets: 161 K/uL (ref 150–400)
RBC: 4.36 MIL/uL (ref 4.22–5.81)
RDW: 16.1 % — ABNORMAL HIGH (ref 11.5–15.5)
WBC: 6.4 K/uL (ref 4.0–10.5)
nRBC: 0 % (ref 0.0–0.2)

## 2024-06-18 ENCOUNTER — Ambulatory Visit: Payer: Self-pay | Admitting: Physician Assistant

## 2024-06-18 DIAGNOSIS — Z79899 Other long term (current) drug therapy: Secondary | ICD-10-CM

## 2024-06-18 MED ORDER — POTASSIUM CHLORIDE ER 10 MEQ PO TBCR: 20.0000 meq | EXTENDED_RELEASE_TABLET | ORAL | 3 refills | Status: DC

## 2024-06-18 MED ORDER — TORSEMIDE 20 MG PO TABS: 20.0000 mg | ORAL_TABLET | ORAL | 3 refills | Status: DC

## 2024-06-22 NOTE — Progress Notes (Signed)
 [facility] MOVEMENT DISORDERS CLINIC  Date of Clinic Appointment: 06/22/2024  [name] [id number] Jul 25, 1933 [address] [place] [address]  Primary Care Physician: Auston Reyes BIRCH, MD [address] [place] [address] [phone]  [phone]  Referring Physician: Auston Reyes BIRCH, MD [address] [facility] [place],  [place] [address]   Reason for Visit:  Chief Complaint  Patient presents with  . Follow-up    Essential tremor     Initial History of Present Illness from 12/07/14 from Dr. Glendia: Returns with wife. Last seen here in May 2013 regarding intermittent headaches for the previous year that come and go. Back with hand tremors that are getting worse.  Has to hold head over the plate when he eats. Trouble with mouse computer.  Not aware of other in family with similar tremor.  Can carry a tray with both hands, not with one.  Has to hold a drink with 2 hands.  Can eat soup with a spoon if he holds his head over the bowl. Can carry a cup and saucer only with 2 hands. Worse with R hand. Has dropped communion in church.  He has noticed the hand tremor since 1997 when he was toasting his daughter at her wedding.    Tremor resolves with alcohol.    Subsequent Changes/Pertinent History: -propranolol  did not help and caused side effects -primidone did not help and caused side effects -h/o L parietal-occipital bleed -MoCA 16/30 on Donepezil 2.5 mg - Did not tolerate aricept 5mg    Interval History Since Last Visit: [name].  is a 89 y.o. right handed male (plays golf left handed) who returns to the Movement Disorders Clinic at [facility] for follow up of essential tremor and mild parkinsonism.   He was last seen in clinic on 03/06/2023.  He is here with his wife who helps provide the history.   [name]   Underwent OT driving  eval - no driving  Memory worse - hard time communicating, cannot carry on conversation. This can be very frustrating for both of them He was referred to [facility] for cognitive evaluation  He underwent heart valve replacement, has lots of edema.  Mostly able to feed himself  Off donepezil  SLP and OT not helpful.  Tremor not significantly worse.    History of Present Illness [name]. is an 89 year old male with atrial fibrillation and dementia who presents with tremor and memory issues.   Since June, he has experienced significant fluid retention from his stomach to his feet, which began after a heart valve replacement in November. His cardiologist noted that his heart was out of rhythm when the fluid retention started. He is currently on a diuretic every other day with potassium supplementation to manage the fluid retention.  He has a history of atrial fibrillation, which is currently out of rhythm. He has significant memory issues and difficulty communicating, often forgetting things and talking to himself. He previously tried donepezil for memory, but it caused adverse side effects, so he discontinued it. He is scheduled for geriatric testing at [facility] in September with Dr. Luke Cap. His family history includes Alzheimer's or dementia in his grandfather, father, and uncle, with onset in their late seventies to early eighties.  He experiences hand tremors, which have not worsened since the last visit. He can still feed  and dress himself. His B12 levels were checked in 2023 and were normal. Thyroid function was also checked three months ago and was normal.  He no longer drives following a driving test. He enjoys yard work but is limited in his activities. He tries to walk a block or two regularly. No hallucinations or safety concerns, but he experiences frustration due to communication difficulties.    Patient Active Problem List   Diagnosis Date Noted  . Prediabetes  12/18/2023  . 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 CVA (cerebral vascular accident) (CMS/HHS-HCC)   . GERD (gastroesophageal reflux disease)   . HFrEF (heart failure with reduced ejection fraction) (CMS/HHS-HCC)   . Ischemic cardiomyopathy   . Chronic anticoagulation: Eliquis  10/10/2023  . History of non-ST elevation myocardial infarction (NSTEMI) 10/10/2023  . Presence of stent in coronary artery 10/10/2023  . Long term current use of clopidogrel  10/10/2023  . Congestive heart failure, NYHA class II, chronic, systolic (CMS/HHS-HCC) 06/26/2023  . Paroxysmal atrial fibrillation (CMS/HHS-HCC) 06/10/2023  . Iron deficiency anemia due to chronic blood loss 01/30/2023  . Low testosterone 07/06/2020  . Cerebral infarction (CMS/HHS-HCC) 05/10/2019  . Exposure to Agent [facility] 05/10/2019  . Mononeuritis 05/10/2019  . Closed fracture of right hip with routine healing 02/06/2019  . Dementia without behavioral disturbance (CMS/HHS-HCC) 01/06/2019  . Femoral neck fracture (CMS/HHS-HCC) 10/25/2018  . Acute embolic stroke (CMS/HHS-HCC) 96/97/7981  . Stenosis of right carotid artery 02/01/2017  . Nontraumatic subcortical hemorrhage of left cerebral hemisphere (CMS/HHS-HCC) 01/06/2017  . Essential hypertension 01/06/2017  . Parkinsonism (CMS/HHS-HCC) 12/27/2016  . Personal history of other malignant neoplasm of skin 01/17/2016  . Unstable balance 12/20/2015  . Bradycardia 11/10/2015  . Osteoarthritis of spine with radiculopathy, lumbar region 11/01/2015  . Lumbar stenosis with neurogenic claudication 11/01/2015  . Lumbar radiculitis 11/01/2015  . Essential tremor 04/13/2015  . Pseudophakia, left eye 01/12/2015  . Pseudophakia, right eye 12/22/2014  . DDD (degenerative disc disease), cervical   . Peripheral neuropathy   . Lower GI bleed   . Hypertension   . Hyperlipidemia   . Intention tremor   . Benign prostatic hyperplasia   . ED (erectile dysfunction)   . History  of [facility] spotted fever   . Small bowel obstruction (CMS/HHS-HCC)   . Vertigo   . Abnormal CXR (chest x-ray)   . ASCVD (arteriosclerotic cardiovascular disease)   . History of nonmelanoma skin cancer 03/18/2012  . AK (actinic keratosis) 03/18/2012  . Syncope and collapse 04/10/2011  . Dyspepsia and other specified disorders of function of stomach 07/31/2010  . Coronary atherosclerosis of native coronary artery 05/16/2010     Past Medical History:  Diagnosis Date  . ASCVD (arteriosclerotic cardiovascular disease)    s/p PTCA with stent placement  . Basal cell carcinoma   . Benign prostatic hyperplasia   . Bradycardia 11/10/2015   Last Assessment & Plan:   Asymptomatic bradycardia, likely exacerbated by propranolol  being used for tremors.  Suggested he monitor his heart rate especially with any symptoms of fatigue, lightheadedness, exercise intolerance.    . CHB (complete heart block) (CMS/HHS-HCC) 10/24/2023  . Chronic anticoagulation: Eliquis  10/10/2023  . CVA (cerebral vascular accident) (CMS/HHS-HCC)   . DDD (degenerative disc disease), cervical   . ED (erectile dysfunction)   . Exposure to [employer]   . GERD (gastroesophageal reflux disease)    no meds   only when eats too much  . HFrEF (heart failure with reduced ejection fraction) (CMS/HHS-HCC)   .  History of non-ST elevation myocardial infarction (NSTEMI) 10/10/2023  . History of [facility] spotted fever   . Hyperlipidemia   . Hypertension   . Intention tremor   . Ischemic cardiomyopathy   . Long term current use of clopidogrel  10/10/2023  . Lower GI bleed    due to hemorrhoids  . Nontraumatic subcortical hemorrhage of left cerebral hemisphere (CMS/HHS-HCC) 01/06/2017  . Parkinsonism (CMS/HHS-HCC) 12/27/2016   mild    . Paroxysmal atrial fibrillation (CMS/HHS-HCC) 06/10/2023  . Peripheral neuropathy    with chronic foot pain  . Presence of stent in coronary artery 10/10/2023  . Small bowel  obstruction (CMS/HHS-HCC)   . Squamous Cell Carcinoma   . Syncope and collapse 04/10/2011   Last Assessment & Plan:   No recent episodes of syncope or near syncope. No further workup. We'll try not to push his blood pressure aggressively low to avoid symptoms    . Temporary transvenous cardiac pacemaker present 10/24/2023   ON 10/23/2023 for CHB by Dr. Pierrette    . Temporary transvenous cardiac pacemaker present 10/24/2023   ON 10/23/2023 for CHB by Dr. Pierrette   . Vertigo   . Vision abnormalities     Past Surgical History:  Procedure Laterality Date  . EXTRACTION CATARACT EXTRACAPSULAR W/INSERTION INTRAOCULAR PROSTHESIS Right 12/22/2014   Procedure: R - EXTRACTION CATARACT EXTRACAPSULAR WITH PHACO WITH INSERTION INTRAOCULAR PROSTHESIS;  Surgeon: Latisha Caro Martyr, MD;  Location: [facility] OR;  Service: Ophthalmology;  Laterality: Right;  . EXTRACTION CATARACT EXTRACAPSULAR W/INSERTION INTRAOCULAR PROSTHESIS Left 01/12/2015   Procedure: L--EXTRACTION CATARACT EXTRACAPSULAR WITH PHACO WITH INSERTION INTRAOCULAR PROSTHESIS;  Surgeon: Latisha Caro Martyr, MD;  Location: [facility] OR;  Service: Ophthalmology;  Laterality: Left;  . Right Hip surgery   10/2018  . TRANSCATHETER AORTIC VALVE REPLACEMENT N/A 10/23/2023   Procedure: TRANSCATHETER AORTIC VALVE REPLACEMENT (SAPIEN) WITH PROSTHETIC VALVE; PERCUTANEOUS FEMORAL ARTERY APPROACH, S3;  Surgeon: Ricky Reyes Prude, MD;  Location: [facility] OPERATING ROOMS;  Service: Cardiothoracic;  Laterality: N/A;  . PTCA     with stent placement  . Surgical repair of bowel obstruction    . TONSILLECTOMY      Family History  Problem Relation Name Age of Onset  . Cataracts Mother    . Heart failure Mother    . Hyperlipidemia (Elevated cholesterol) Mother    . High blood pressure (Hypertension) Mother    . High blood pressure (Hypertension) Other    . Prostate cancer Other    . Alzheimer's disease Other    . Anesthesia problems Father    . Heart failure  Father    . Alzheimer's disease Father    . No Known Problems Daughter 4   . No Known Problems Son 2   . Glaucoma Neg Hx    . Macular degeneration Neg Hx      Social History   Socioeconomic History  . Marital status: Married    Spouse name: Nena  . Number of children: 2  . Years of education: 16  . Highest education level: Bachelor's degree (e.g., BA, AB, BS)  Occupational History  . Occupation: Retired - Real estate  Tobacco Use  . Smoking status: Never  . Smokeless tobacco: Never  Vaping Use  . Vaping status: Never Used  Substance and Sexual Activity  . Alcohol use: Not Currently    Comment: maybe 1-2 times a week- 2 glasses at dinner  . Drug use: Never  . Sexual activity: Defer    Partners: Female    Comment:  married for 48 years  Social History Narrative   He is a Engineer, maintenance (IT).  He was in the [employer] after college and now works part-time in [employer].  He does not smoke or use any recreational drugs.  He occasionally has a drink with dinner.  He is married and has a son and daughter were in good health.  He lives with his wife in [place]    Social Drivers of Health   Food Insecurity: No Food Insecurity (06/17/2023)   [facility]   Hunger Vital Sign   . Within the past 12 months, you worried that your food would run out before you got the money to buy more.: Never true   . Within the past 12 months, the food you bought just didn't last and you didn't have money to get more.: Never true  Transportation Needs: No Transportation Needs (10/23/2023)   PRAPARE - Transportation   . Lack of Transportation (Medical): No   . Lack of Transportation (Non-Medical): No  Housing Stability: Unknown (03/04/2024)   Housing Stability Vital Sign   . Homeless in the Last Year: No     Current Outpatient Medications:  .  acetaminophen  (TYLENOL ) 650 MG ER tablet, Take 1,000 mg by mouth every 8 (eight) hours as needed for Pain, Disp: , Rfl:   .  apixaban  (ELIQUIS ) 2.5 mg tablet, Take 1 tablet (2.5 mg total) by mouth 2 (two) times daily, Disp: , Rfl:  .  ascorbic acid, vitamin C , (VITAMIN C ) 1000 MG tablet, Take 1,000 mg by mouth once daily, Disp: , Rfl:  .  atorvastatin  (LIPITOR) 20 MG tablet, Take 1 tablet (20 mg total) by mouth once daily, Disp: 30 tablet, Rfl: 11 .  cholecalciferol  (VITAMIN D3) 1,000 unit capsule, Take 2 capsules (2,000 Units total) by mouth once daily., Disp: 30 capsule, Rfl: 0 .  clopidogreL  (PLAVIX ) 75 mg tablet, Take 75 mg by mouth once daily, Disp: , Rfl:  .  cyanocobalamin  (VITAMIN B12) 1000 MCG tablet, Take 1,000 mcg by mouth once daily, Disp: , Rfl:  .  docusate (COLACE) 100 MG capsule, Take 1 capsule (100 mg total) by mouth 2 (two) times daily, Disp: 60 capsule, Rfl: 0 .  empagliflozin (JARDIANCE) 25 mg tablet, Take 12.5 mg by mouth once daily, Disp: , Rfl:  .  ezetimibe  (ZETIA ) 10 mg tablet, Take 10 mg by mouth once daily., Disp: , Rfl:  .  flaxseed oiL 1,000 mg Cap, Take by mouth, Disp: , Rfl:  .  metoprolol  SUCCinate (TOPROL -XL) 25 MG XL tablet, Take 0.5 tablets (12.5 mg total) by mouth once daily, Disp: 45 tablet, Rfl: 3 .  pantoprazole  (PROTONIX ) 40 MG DR tablet, Take 1 tablet (40 mg total) by mouth once daily, Disp: 30 tablet, Rfl: 11 .  potassium chloride  (KLOR-CON ) 10 MEQ ER tablet, Take 10 mEq by mouth as directed (when taking lasix ), Disp: , Rfl:  .  pregabalin  (LYRICA ) 150 MG capsule, Take 150 mg by mouth once daily, Disp: , Rfl:  .  psyllium husk, with sugar, (METAMUCIL) 3.4 gram/12 gram oral powder, Take 1.7 g by mouth once daily Mix with a full glass (240 mL) of fluid., Disp: , Rfl:  .  tamsulosin  (FLOMAX ) 0.4 mg capsule, Take 2 capsules (0.8 mg total) by mouth once daily. Take 30 minutes after same meal each day., Disp: 30 capsule, Rfl: 0 .  vitamin B complex (B COMPLEX 1 ORAL), Take 1 capsule by mouth once daily., Disp: ,  Rfl:  .  zinc  sulfate (ZINC -15 ORAL), Take 1 tablet by mouth once  daily Takes 30 mg zinc  tablet, Disp: , Rfl:  .  FUROsemide  (LASIX ) 20 MG tablet, Take 20 mg by mouth once daily as needed for Edema, Disp: , Rfl:  .  memantine (NAMENDA) 10 MG tablet, Take 1 tablet (10 mg total) by mouth 2 (two) times daily, Disp: 60 tablet, Rfl: 11 .  nitroGLYcerin  (NITROSTAT ) 0.4 MG SL tablet, Place 1 tablet (0.4 mg total) under the tongue every 5 (five) minutes as needed for Chest pain May take up to 3 doses., Disp: 25 tablet, Rfl: 1 .  pregabalin  (LYRICA ) 100 MG capsule, Take 1 capsule (100 mg total) by mouth 2 (two) times daily Takes 100 mg in am and 150 mg at night, Disp: 90 capsule, Rfl: 3 .  ranolazine  (RANEXA ) 500 MG ER tablet, Take 500 mg by mouth 2 (two) times daily, Disp: , Rfl:  .  TORsemide  (DEMADEX ) 20 MG tablet, Take as directed based on day (odd or even # day) and wt.  See instruction., Disp: 90 tablet, Rfl: 1  Allergies: [name] is allergic to penicillins and codeine.  Review of Systems: A 14 point review of systems was reviewed with the patient.  Pertinent positives include: tremor and are further detailed in the HPI of this report.  All other systems are negative.  Physical Exam:  Vital Signs:  Vitals:   06/22/24 1358 06/22/24 1406  BP: 114/65 116/65  BP Location: Left upper arm Left upper arm  Patient Position: Sitting Standing  BP Cuff Size: Adult Adult  Pulse: 75 69  SpO2: 98% 97%  Weight: 76.9 kg (169 lb 8 oz)      Neurologic exam:  Mental Status Exam: Alert, able to answer some questions, severe cognitive impairment noted, unable to provide much history     01/06/2019    1:12 PM  MOCA  Trails 0  Cube 0  Clock 2  Naming 1  Digit Span 2  Letter A 0  Serial 7s 0  Sentence Repetition 2  Fluency 0  Abstraction 1  Orientation 4  Memory 0  Education level 0  Total Score 12  Acutal or Adjusted Denominator 30       UPDRS III:  Speech: 1 = Slight loss of expression, diction and/or volume.  Facial Expression: 2 = Slight but definitely  abnormal diminution of facial expression.  Head tremor: 0 = Absent.  Right upper extremity rest tremor: 0 = Absent.  Left upper extremity rest tremor: 1 = Slight and infrequently present. Mild and infrequent Right lower extremity rest tremor: 0 = Absent.  Left lower extremity rest tremor: 0 = Absent.  Action/postural tremor of right hand: 2 = Moderate in amplitude, present with action. Re-emergent tremor Action/postural tremor of left hand: 1 = Slight; present with action.  Neck Rigidity: 2 = Mild to moderate.  Rigidity - right upper extremity: 0 = Absent.  Rigidity - left upper extremity: 0 = Absent. Rigidity - right lower extremity: 0 = Absent.  Rigidity - left lower extremity: 0 = Absent.  Right finger taps: 1 = Mild slowing and/or reduction in amplitude.  Left finger taps: 1 = Mild slowing and/or reduction in amplitude.  Right Hand Movements: 1 = Mild slowing and/or reduction in amplitude.  Left Hand Movements: 1 = Mild slowing and/or reduction in amplitude.  Right pronation-supination: 1 = Mild slowing and/or reduction in amplitude.  Left pronation-supination: 1 = Mild slowing and/or reduction  in amplitude.  Right leg agility: 1 = Mild slowing and/or reduction in amplitude.   Left leg agility: 1 = Mild slowing and/or reduction in amplitude.   Arising from Chair: 1 = Slow; or may need more than one attempt.  Posture: 2 = Moderately stooped posture, definitely abnormal; can be slightly leaning to one side.  Gait: 2 = Walks with difficulty, but requires little or no assistance; may have some festination, short steps, or propulsion. Slightly wide based, decreased arm swing on the right, right foot turned out Postural Stability:  Not tested  Body Bradykinesia and Hypokinesia: 1 = Minimal slowness, giving movement a deliberate character; could be normal for some persons. Possibly reduced amplitude.  Dyskinesia: None Cerebellar examination: Finger-nose-finger intact. No ataxia noted. There  is no evidence of dysmetria nor dysdiadochokinesia.     Assessment:    ICD-10-CM  1. Cognitive complaints  R41.9  2. Tremor  R25.1  3. Dementia without behavioral disturbance (CMS/HHS-HCC)  F03.90     [name]. is a 89 y.o. male with essential tremor. He is noted with a resting component to his tremor and mildly slowed RAMs but without clear bradykinesia.   They feel that his tremor has done well over the past year and does not wish to start medication at this time.  ET meds have not been helpful/tolerated in the past. While he does have a slight rest component to his tremor, his exam is relatively stable since last year. He will continue to be followed for progression.   He has worsening cognitive function with possible contributions from his bleed and due to cortical dysfunction (AD).   He did not find donepezil 5 mg helpful and reports potential side effects. As communication is very frustrating, they would like to try memantine to see if there is any benefit.   He will continue to be followed yearly for progression.  No driving.   Assessment & Plan Severe Dementia Dementia with cognitive decline, brain atrophy, and white matter changes. Intolerant to donepezil. Memantine initiated for potential cognitive benefits. - Initiate memantine with gradual dose increase over one month, then maintain for an additional month to assess efficacy. - Provided [facility] dementia support group flyer. - Encouraged cognitive activities such as puzzles and listening to books on tape. - Discussed Mediterranean and MIND diets for brain health.  Essential Tremor Essential tremor stable, no Parkinsonism signs, resting tremor noted. - Continue periodic neurological evaluations to monitor for Parkinsonism.  Parkinsonism No evidence of Parkinsonism; slow, discoordinated/apraxic movements likely due to cognitive decline. - Reassess periodically for any development of Parkinsonism  symptoms.   Plan: Medications Ordered: Requested Prescriptions   Signed Prescriptions Disp Refills  . memantine (NAMENDA) 10 MG tablet 60 tablet 11    Sig: Take 1 tablet (10 mg total) by mouth 2 (two) times daily     Orders Placed: No orders of the defined types were placed in this encounter.    Patient Instructions  Start Namenda (Memantine) to help with memory. I will send in a 10mg  tablet. You can take 1/2 tablet for the 5mg  doses.    Morning Dinner    Week 1     None   5mg     Week 2     5 mg   5 mg    Week 3     5 mg   10 mg      Week 4     10 mg    10 mg  If you are tolerating it, stay on this medication for a month. If there is no benefit you can stop it.   Recommendations  Exercise:  The only intervention shown to slow the rate of progression of cognitive disorders is regular physical Exercise. Any type of exercise (including walking) is acceptable although a recumbent bicycle may be best if you are unsteady. I recommend that you get 150 minutes of moderate-intensity aerobic activity per week.  Moderate-intensity means that are breathing harder and your heart is beating faster. You should be able to talk, but not sing the words to your favorite song.  I also recommend that on two or more days a week that you do muscle-strengthening activities that work all major muscle groups. Disease related apathy can be a significant roadblock to exercise and the only way to overcome this is to make it a daily routine and perhaps have a reward at the end (something you love to eat or drink perhaps) or a personal trainer coming to the home can also be very useful. Most importantly, the you are much more likely to exercise if your caregiver / spouse does it with you. In general a structured, repetitive schedule is best.   Diet: Eating a heart-healthy diet is also good for your brain! One example of a heart-healthy diet is the Mediterranean diet. The Mediterranean diet  is rich in fish and poultry instead of red meat, nuts (mostly non-peanuts), vegetables, fruits, olive oil or canola oil (instead of butter), whole grain rice, bread, cereal and pasta, and minimizes salt (relying on other spices to flavor foods), and small amounts of wine. There is evidence that the MIND diet, which is a combination of the Mediterranean diet and the DASH diet, is good for your brain. You can learn more by reading the book: The MIND Diet by Annitta Feeling or visiting  WildWildScience.es.  Sleep:  Your brain needs high quality, uninterrupted sleep to rest and recover after every day.   Alcohol: Alcohol can make your memory and thinking problems worse. I recommend that you limit or stop alcohol use.  Social Activity and Brain Training: Socializing is great for your brain! Other activities that may help your brain include reading, cross-word puzzles, learning new things or developing new hobbies. I recommend that you reach out to your local  senior center to learn about services in your area.   Other tips - Develop a regular schedule of activities. Try to wake up, go to bed, and eat your meals at the same time every day.  - Avoid multi-tasking. Limit distractions and focus on a single activity at a time. Allow yourself additional time to complete tasks. Invite trusted friends or family to check behind you, especially with medications and finances.  - You may find that your memory or thinking is better at certain times of the day. Try to schedule activities during these times.  - Making sure that your blood pressure, blood sugar, and cholesterol are well-controlled can help protect your brain. - Any illness or diseases that affects your body can make memory or thinking worse. This means that something as simple as a cold can cause your memory and thinking to temporarily get worse. - Make sure that hearing loss or poor vision are not making things worse. Get you  eyesight and hearing checked   PLANNING FOR THE FUTURE:   Healthcare Power of Attorney and Advanced Care Directives: There may be a situation where you are unable to make decisions about  your medical care due to an illness or your memory problem. I recommend that you discuss your preferences with a trusted friend or family member. I also recommend that you designate someone that you trust to make decisions on your behalf if you are unable to make them yourself. Most people look to their spouse to make these decisions, but there may be a situation where your spouse is not able to make these decisions. In that case, it's important to think about who you would want to help in these situations and to complete the required paperwork.   You can find information about the required paperwork for [place]  at the following website:  [email or web address] Some states require that these forms be notarized.   You can also speak to a lawyer about appropriate steps to handle your finances, should there come a time when you need help managing them.   RESOURCES FOR PEOPLE LIVING WITH DEMENTIA AND THEIR CAREGIVERS: The organizations below offer support groups, activities, and additional resources for people living with dementia and their caregivers.   [facility] Family Support [facility]. Phone: [phone]. Website: www.StreamingFood.com.cy. Alzheimer's Association. Phone: 667-632-0081. Website: LimitLaws.hu.   [facility] [place] . Phone: (817)780-6685. Website: DustingSprays.pl.   Books and Educational Resources:  A Caregiver's Guide to Dementia: Using Activities and Other Strategies to Prevent, Reduce and Manage Behavioral Symptoms by Leita SAILOR. [place] and Dorothyann Verrier Piersol  A Caregiver's Guide to [place] [employer] [place] Dementia  by Sherrilyn Trudell Gammon MS BSN and Lynwood Gammon  What If It's Not Alzheimer's?: A Caregiver's Guide  to Dementia  by Arley Boon (Author), Olam Boon (Editor) The 36 hour day by Rabins and Mace Understanding Difficult Behaviors by Lang Mirza and White The Alzheimer's Reading Room. Website: http://www.alzheimersreadingroom.com/    The Alzheimer's Compendium. Website: http://www.alzcompend.info/ Caregiver Helpbook. Website. Website: www.powerfultoolsforcaregivers.[employer] of Aging. Website: CashCowGambling.be.  [employer] of Neurological Disorders and Stroke. Website: CampingSystem.co.za    Follow Up: Return in about 1 year (around 06/22/2025).  I personally performed the service, non-incident to. (WP)   I spent a total of 40 minutes in both face-to-face and non-face-to-face activities, excluding procedures performed, for this visit on the date of this encounter.  This note has been created using automated tools and reviewed for accuracy by SUZEN SLATER SAMPSON SUZEN SLATER HEIDI, PA  06/26/2024, 2:31 PM   [facility] Department of Neurology [facility] [address] [place] [address] Phone [phone], fax [phone]

## 2024-06-22 NOTE — Progress Notes (Signed)
 Patient arrived in clinic today accompanied by wife for the appointment with the scheduled provider. Patient was called back to exam room 30 for initial intake from nursing staff and identified via name and date of birth per protocol.   Nursing staff assessed height and weight and obtained accurate vital signs.    In active dialogue with patient reviewed chief complaint, completed a falls risk assessment for safety needs, reviewed allergies, did a complete medication review including updating the preferred pharmacy with the location and contact numbers.  The patient's pain was assessed with the corresponding pain scale noted in the pain flowsheet.  All questions and concerns addressed at this time regarding the patient's visit thus far.  Refill needs and personal or clinic paperwork was discussed.     Nursing staff provided the patient with their appropriate paperwork per providers request to complete for pending visit.   The patient was notified that clinic staff, in the effort to keep them safe, are escorting patients who are identified as Falls Risks to their cars after appointments.   Patient declined a falls prevention escort to car.

## 2024-06-23 ENCOUNTER — Telehealth: Payer: Self-pay

## 2024-06-23 NOTE — Telephone Encounter (Signed)
 Called to check on pt as he has not attended rehab since 05/06/2024. Pt's wife answered and stated that he was working on getting his medication sorted out and has a doctor's appt on 06/25/2024. She stated that she would update us  after the appt.

## 2024-06-28 ENCOUNTER — Emergency Department

## 2024-06-28 ENCOUNTER — Other Ambulatory Visit: Payer: Self-pay

## 2024-06-28 ENCOUNTER — Emergency Department
Admission: EM | Admit: 2024-06-28 | Discharge: 2024-06-28 | Disposition: A | Discharge status: Home / Self Care | Attending: Emergency Medicine | Admitting: Emergency Medicine

## 2024-06-28 DIAGNOSIS — T675XXA Heat exhaustion, unspecified, initial encounter: Secondary | ICD-10-CM | POA: Insufficient documentation

## 2024-06-28 DIAGNOSIS — Z7901 Long term (current) use of anticoagulants: Secondary | ICD-10-CM | POA: Diagnosis not present

## 2024-06-28 DIAGNOSIS — S50811A Abrasion of right forearm, initial encounter: Secondary | ICD-10-CM | POA: Diagnosis not present

## 2024-06-28 DIAGNOSIS — I4891 Unspecified atrial fibrillation: Secondary | ICD-10-CM | POA: Diagnosis not present

## 2024-06-28 DIAGNOSIS — S59911A Unspecified injury of right forearm, initial encounter: Secondary | ICD-10-CM | POA: Diagnosis present

## 2024-06-28 DIAGNOSIS — R413 Other amnesia: Secondary | ICD-10-CM | POA: Diagnosis not present

## 2024-06-28 DIAGNOSIS — W19XXXA Unspecified fall, initial encounter: Secondary | ICD-10-CM | POA: Insufficient documentation

## 2024-06-28 DIAGNOSIS — T679XXA Effect of heat and light, unspecified, initial encounter: Secondary | ICD-10-CM

## 2024-06-28 DIAGNOSIS — R799 Abnormal finding of blood chemistry, unspecified: Secondary | ICD-10-CM | POA: Insufficient documentation

## 2024-06-28 DIAGNOSIS — S0990XA Unspecified injury of head, initial encounter: Secondary | ICD-10-CM | POA: Insufficient documentation

## 2024-06-28 LAB — COMPREHENSIVE METABOLIC PANEL WITH GFR
ALT: 16 U/L (ref 0–44)
AST: 27 U/L (ref 15–41)
Albumin: 4 g/dL (ref 3.5–5.0)
Alkaline Phosphatase: 100 U/L (ref 38–126)
Anion gap: 16 — ABNORMAL HIGH (ref 5–15)
BUN: 27 mg/dL — ABNORMAL HIGH (ref 8–23)
CO2: 23 mmol/L (ref 22–32)
Calcium: 9.1 mg/dL (ref 8.9–10.3)
Chloride: 99 mmol/L (ref 98–111)
Creatinine, Ser: 1.64 mg/dL — ABNORMAL HIGH (ref 0.61–1.24)
GFR, Estimated: 40 mL/min — ABNORMAL LOW (ref >60)
Glucose, Bld: 114 mg/dL — ABNORMAL HIGH (ref 70–99)
Potassium: 3.2 mmol/L — ABNORMAL LOW (ref 3.5–5.1)
Sodium: 138 mmol/L (ref 135–145)
Total Bilirubin: 2.7 mg/dL — ABNORMAL HIGH (ref 0.0–1.2)
Total Protein: 7.1 g/dL (ref 6.5–8.1)

## 2024-06-28 LAB — CBC WITH DIFFERENTIAL/PLATELET
Abs Immature Granulocytes: 0.03 K/uL (ref 0.00–0.07)
Basophils Absolute: 0 K/uL (ref 0.0–0.1)
Basophils Relative: 0 %
Eosinophils Absolute: 0.3 K/uL (ref 0.0–0.5)
Eosinophils Relative: 3 %
HCT: 36.7 % — ABNORMAL LOW (ref 39.0–52.0)
Hemoglobin: 12.7 g/dL — ABNORMAL LOW (ref 13.0–17.0)
Immature Granulocytes: 0 %
Lymphocytes Relative: 14 %
Lymphs Abs: 1.1 K/uL (ref 0.7–4.0)
MCH: 30.5 pg (ref 26.0–34.0)
MCHC: 34.6 g/dL (ref 30.0–36.0)
MCV: 88 fL (ref 80.0–100.0)
Monocytes Absolute: 0.9 K/uL (ref 0.1–1.0)
Monocytes Relative: 11 %
Neutro Abs: 5.7 K/uL (ref 1.7–7.7)
Neutrophils Relative %: 72 %
Platelets: 123 K/uL — ABNORMAL LOW (ref 150–400)
RBC: 4.17 MIL/uL — ABNORMAL LOW (ref 4.22–5.81)
RDW: 16.7 % — ABNORMAL HIGH (ref 11.5–15.5)
WBC: 8 K/uL (ref 4.0–10.5)
nRBC: 0 % (ref 0.0–0.2)

## 2024-06-28 LAB — URINALYSIS, ROUTINE W REFLEX MICROSCOPIC
Bacteria, UA: NONE SEEN
Bilirubin Urine: NEGATIVE
Glucose, UA: >=500 mg/dL — AB
Hgb urine dipstick: NEGATIVE
Ketones, ur: NEGATIVE mg/dL
Leukocytes,Ua: NEGATIVE
Nitrite: NEGATIVE
Protein, ur: NEGATIVE mg/dL
Specific Gravity, Urine: 1.005 (ref 1.005–1.030)
Squamous Epithelial / HPF: 0 /HPF (ref 0–5)
pH: 6 (ref 5.0–8.0)

## 2024-06-28 LAB — APTT: aPTT: 34 s (ref 24–36)

## 2024-06-28 LAB — PROTIME-INR
INR: 1.4 — ABNORMAL HIGH (ref 0.8–1.2)
Prothrombin Time: 17.4 s — ABNORMAL HIGH (ref 11.4–15.2)

## 2024-06-28 LAB — CBG MONITORING, ED: Glucose-Capillary: 111 mg/dL — ABNORMAL HIGH (ref 70–99)

## 2024-06-28 NOTE — ED Notes (Signed)
 EMS dressing taken off. Wound cleaned with saline.  Vaseline gauze, abd pad, and kerlix applied. Patient tolerated dressing change well.

## 2024-06-28 NOTE — ED Notes (Signed)
 Patient transported to CT

## 2024-06-28 NOTE — ED Provider Notes (Signed)
 Care of this patient assumed from prior physician at 1500 pending CT scans, urinalysis, and disposition. Please see prior physician note for further details.  Briefly, this is an 89 year old male who presents after a fall found to have mildly elevated temperature in the setting of heat exposure.  Creatinine near baseline.  Signed out to me pending CT scans, urinalysis, likely discharge.  CT head without acute bleed CT C-spine without acute fracture Urinalysis without evidence of infection or myoglobinuria  Patient reassessed and patient and family updated on results of workup.  They are comfortable discharge home.  Strict return precautions provided.  Patient discharged in stable condition.   Levander Slate, MD 06/28/24 [phone]

## 2024-06-28 NOTE — ED Triage Notes (Signed)
 Pt arrives via [facility] from home for unwitnessed fall. Wife reports leaving for approximately 20 minutes, but when she came back, he was lying in the yard up against some rocks. Pt is on blood thinners. Abrasion to R forearm. EMS reports temp 100.5 oral.   119/56

## 2024-06-28 NOTE — Discharge Instructions (Addendum)
Return to the ER for new or worsening symptoms. °

## 2024-06-28 NOTE — ED Provider Notes (Signed)
 [facility] Provider Note    Event Date/Time   First MD Initiated Contact with Patient 06/28/24 1407     (approximate)   History   Fall  HPI  [name]. is a 89 y.o. male with a history of paroxysmal A-fib, previous cardiac disease, on anticoagulation.  Also some mild memory issues developing recently.  His wife went to the store, she was gone for about 30 minutes and on returning she found that he was outside and had fallen and was out in the hot sun.  The patient reports that he went outside, he lost his balance and fell and could not get himself up.  He did get an abrasion over his right forearm but reports it does not hurt.  He did not lose consciousness but he does difficulty with mobility at his age and he was unable to get himself up off the ground.  EMS arrived and found that he was warm to touch, had a temperature of 100.4.  He also reports that he feels warm or burning type feeling over his ankles but was wearing his shoes.  He did strike his head but did not lose consciousness and is not bleeding.  No neck pain no chest pain no difficulty breathing.  He has been in his normal health and saw his doctor just recently, but reports he lost his balance outside and could not get back up.  His mentation is at his normal or having normal conversation and he appears to be acting normally.  He reports he felt really weak and tired while in the hot sun, but feels much better now that he is come out of the sun     Physical Exam   Triage Vital Signs: ED Triage Vitals  Encounter Vitals Group     BP 06/28/24 1412 124/67     Girls Systolic BP Percentile --      Girls Diastolic BP Percentile --      Boys Systolic BP Percentile --      Boys Diastolic BP Percentile --      Pulse Rate 06/28/24 1410 81     Resp 06/28/24 1410 18     Temp 06/28/24 1410 99.7 F (37.6 C)     Temp Source 06/28/24 1410 Oral     SpO2 06/28/24 1410 95 %     Weight --       Height --      Head Circumference --      Peak Flow --      Pain Score --      Pain Loc --      Pain Education --      Exclude from Growth Chart --     Most recent vital signs: Vitals:   06/28/24 1410 06/28/24 1412  BP:  124/67  Pulse: 81   Resp: 18   Temp: 99.7 F (37.6 C)   SpO2: 95%      General: Awake, no distress.  Normocephalic atraumatic.  No obvious head injury no cervical tenderness.  Moves all extremities well.  He does have abrasion over the right dorsal forearm without laceration.  No significant bleeding. CV:  Good peripheral perfusion.  Normal tone slightly irregular normal rate.  Reports history of atrial fibrillation Resp:  Normal effort.  Clear lungs bilateral Abd:  No distention.  Soft nontender nondistended Other:  Moves all extremities well without deficit.  Mild erythema of the medial portions of the lower legs bilaterally,  patient reports sun exposure/sunburn developing.  Trace edema, also follows with cardiology for this and this is steady and stable for him   ED Results / Procedures / Treatments   Labs (all labs ordered are listed, but only abnormal results are displayed) Labs Reviewed  CBC WITH DIFFERENTIAL/PLATELET - Abnormal; Notable for the following components:      Result Value   RBC 4.17 (*)    Hemoglobin 12.7 (*)    HCT 36.7 (*)    RDW 16.7 (*)    Platelets 123 (*)    All other components within normal limits  COMPREHENSIVE METABOLIC PANEL WITH GFR - Abnormal; Notable for the following components:   Potassium 3.2 (*)    Glucose, Bld 114 (*)    BUN 27 (*)    Creatinine, Ser 1.64 (*)    Total Bilirubin 2.7 (*)    GFR, Estimated 40 (*)    Anion gap 16 (*)    All other components within normal limits  PROTIME-INR - Abnormal; Notable for the following components:   Prothrombin Time 17.4 (*)    INR 1.4 (*)    All other components within normal limits  CBG MONITORING, ED - Abnormal; Notable for the following components:    Glucose-Capillary 111 (*)    All other components within normal limits  APTT  URINALYSIS, ROUTINE W REFLEX MICROSCOPIC     EKG  Compared to previous ECG performed by cardiology department this month no significant change in rhythm is noted.  Interpreted by me at 1430 heart rate 75 QRS 160 QTc 520.  Left bundle branch block.  No evidence of frank ischemia.  Occasional areas that appear to represent potential small P waves possibly A-fib.  I do not see any evidence of high degree heart block, frank ischemia   RADIOLOGY  CT head inter by me as grossly negative for acute intracranial hemorrhage awaiting final read by radiology for CT head and cervical spine.   PROCEDURES:  Critical Care performed: No  Procedures   MEDICATIONS ORDERED IN ED: Medications - No data to display   IMPRESSION / MDM / ASSESSMENT AND PLAN / ED COURSE  I reviewed the triage vital signs and the nursing notes.                              Differential diagnosis includes, but is not limited to, heat exposure after falling, loss of balance, mechanical fall based on the patient's history presented.  Less likely as he no associated obvious systemic symptoms or preceding symptoms of illness but also note of fever could be considered secondary to infection but at this point given he reports a fall with a heat index of the 100 degrees and his exposure to the heat I suspect most likely he had mild element of heat exhaustion.  He is awake alert well-oriented his body temp has come down to 99.7 was earlier 100.4 with EMS.  He reports feeling much improved and I suspect his heat exposure has vastly improved at this time.  He appears well-hydrated no distress  Abrasion to the right forearm but no bony tenderness.  Moves all extremities well fully awake alert oriented to his baseline.  Creatinine 1.6 approximately at his baseline, does report some recent diuresis.  Also noted chronic slightly elevated bilirubin.  Platelet  count also slightly decreased but notably similar to testing about 2 weeks ago.    Patient's presentation is most consistent  with acute complicated illness / injury requiring diagnostic workup.   ----------------------------------------- 3:42 PM on 06/28/2024 ----------------------------------------- Ongoing care assigned to Dr. Levander.  Reassessment of the patient anticipated, awaiting CT of the head and cervical spine result as well as urinalysis.  Patient continue to do well core temp improving I suspect that he likely had a mechanical fall and if imaging reassuring likely discharge to home to continue his current medication and follow-up.  He does have a follow-up visit with his doctor coming later this week as well.         FINAL CLINICAL IMPRESSION(S) / ED DIAGNOSES   Final diagnoses:  Fall, initial encounter  Abrasion of right forearm, initial encounter  Closed head injury, initial encounter  Heat exposure, initial encounter     Rx / DC Orders   ED Discharge Orders     None        Note:  This document was prepared using Dragon voice recognition software and may include unintentional dictation errors.   Dicky Anes, MD 06/28/24 [phone]

## 2024-06-30 ENCOUNTER — Ambulatory Visit: Attending: Physician Assistant

## 2024-06-30 DIAGNOSIS — I4892 Unspecified atrial flutter: Secondary | ICD-10-CM | POA: Diagnosis present

## 2024-06-30 NOTE — Progress Notes (Unsigned)
 Cardiology Office Note    Date:  07/01/2024   ID:  Calvin Camp Armand Raddle., DOB 28-Apr-1933, MRN [id number]  PCP:  Auston Reyes BIRCH, MD  Cardiologist:  Evalene Lunger, MD  Electrophysiologist:  None   Chief Complaint: Follow up  History of Present Illness:   Calvin Luckadoo. is a 89 y.o. male with history of CAD status post PCI with Taxis stent to the RCA in 2007, HFrEF, PAF diagnosed in 08/2023 with consideration for Watchman, recently diagnosed of atrial flutter in 05/2024, severe aortic stenosis status post TAVR at [facility] on 10/23/2023, PSVT, CVA in 2018 with imaging showing acute left-sided parieto-occipital intraparenchymal hematoma with adjacent subarachnoid hemorrhage felt to represent hemorrhagic transformation of embolic infarct, bleeding ulcer in 01/2023 requiring 2 units PRBC, syncope in 2021 of unclear etiology, mechanical falls, carotid artery stenosis, CKD stage III, HTN, and HLD who presents for follow-up of atrial flutter and acute on chronic HFrEF.   He was admitted in 2018 with CVA.  Subsequent prolonged outpatient cardiac monitoring showed no evidence of A-fib at that time with some episodes of tachycardic and bradycardic rates.  Echo from 02/2020 showed an EF of 55% with no regional wall motion abnormalities and grade 1 diastolic dysfunction.  There was mild to moderate aortic valve sclerosis with borderline mild aortic stenosis at that time.  Lexiscan  MPI in 03/2020 showed no significant ischemia and was overall low risk.  Repeat outpatient cardiac monitoring in 01/2022 showed sinus rhythm with 156 episodes of SVT lasting up to 19 beats as well as an idioventricular rhythm, frequent PACs, and rare ventricular ectopy.  Repeat outpatient cardiac monitoring in 03/2023 showed a predominant rhythm of sinus with an average rate of 61 bpm with a range of 38 to 146 bpm, 1 run of NSVT lasting 9 beats, 408 episodes of SVT lasting up to 25.4 seconds, frequent PACs with a burden of 7.9%,  occasional atrial couplets with a burden of 1.4%, occasional PVCs with a burden of 1.8%.   He was admitted to the hospital in 06/2023 with NSTEMI with troponin trending to 1300.  Cardiac cath showed severe multivessel CAD with no clear culprit as outlined below.  He was not felt to be a candidate for CABG.  Echo in 06/2023 showed an EF of 35 to 40% with moderate aortic stenosis with a mean gradient of 16 mmHg, peak gradient 25 mmHg, and an estimated valve area 0.8 cm with low-flow, low gradient.  He was later found to be in A-fib by EKG in late 08/2023.  In this setting he was evaluated for watchman at [facility] with recommendation to undergo repair of aortic stenosis initially.  Echo at [facility] in 09/2023 showed EF unchanged at 35% with severe aortic stenosis with a mean gradient 21 mmHg and peak gradient 35 mmHg along with moderate mitral regurgitation.  Plan was to proceed with TAVR.  He underwent TAVR at [facility] on 10/23/2023 with procedure complicated by complete heart block requiring temporary wire without permanent pacemaker implantation needed.  Post-procedure limited echo showed normal functioning aortic valve prosthesis.   More recently, he was treated for URI/bronchitis after traveling to the [facility] and given antibiotics by outside primary MD.  He was last seen in our office in 05/2024 and reported increased shortness of breath with lower extremity swelling and had been started on Lasix .  His weight was up approximately 6 to 10 pounds from baseline.  EKG in the office showed new onset atrial flutter.  He was  continued on a reduced dose of apixaban  2.5 mg daily (note indicates dose reduced by [facility] due to advanced age, prior GI bleed, and with plans for possible Watchman evaluation).  It was also recommended he increase Lasix  to 20 mg twice daily with KCl repletion.  He was evaluated in our office on 06/09/2024 with continued lower extremity swelling on furosemide  20 mg twice daily.  His weight was down 6 pounds  when compared to his visit in 05/2024.  With labs noted to be stable at that time he was transitioned from furosemide  to torsemide  20 mg daily for 3 days followed by 20 mg daily thereafter.  He followed up with [facility] cardiology on 06/25/2024 and remained volume overloaded with recommendation to take torsemide  40 mg on even numbered days if weight greater than 155 (none if weight less than 155 pounds) or 20 mg on half number of days with no torsemide  if weight under 150 pounds.  Labs obtained at that time showed an elevated though improving proBNP outlined below and continued renal dysfunction slightly above baseline of 1.3-1.4 at 1.8.  In this setting, they were subsequently advised to take torsemide  20 mg alternating with 40 mg every other day on 07/01/2024.  He was seen in the ED on 06/29/2023 following a mechanical fall in the backyard.  Per his wife, upon EMS arrival the patient's temperature was 110 F.  Labs at that time showed an improving creatinine at 1.64.  CT head showed an old left parietal infarct with no acute intracranial abnormality.  He comes in accompanied by his wife today who provides most of the history given patient's underlying progressive cognitive decline.  He continues to note some lower extremity swelling.  He has not reported symptoms of angina or progressive orthopnea.  Weight trend is down approximately 3 to 4 pounds when compared to his last visit.  Over the past month his weight is down approximately 11 pounds.  They reported a dry weight of around 165 to 168 pounds.  He is drinking less than 2 L of liquid daily.  No symptoms concerning for bleeding.  No syncopal episodes.  His wife continues to prefer to avoid sedation with potential cardioversion given underlying cognitive decline.  Echo pending.   Labs independently reviewed: 06/2024 - potassium 3.2, BUN 27, serum creatinine 1.64, albumin 4.0, AST/ALT normal Hgb 12.7, PLT 123, proBNP 3995 05/2024 - magnesium 2.3, proBNP  5090 03/2024 - TC 111, TG 41, HDL 53, LDL 49, TSH normal, A1c 5.8  Past Medical History:  Diagnosis Date   Acute embolic stroke (HCC) 02/01/2017   BPH (benign prostatic hyperplasia)    Bradycardia 11/10/2015   Coronary artery disease    Dizziness 08/21/2012   DYSPEPSIA 07/31/2010   Qualifier: Diagnosis of  By: Sebastian RN, Rosina     Hyperlipidemia 05/16/2010   Qualifier: Diagnosis of  By: Perla MD, [name]      Hypertension    HYPERTENSION, BENIGN 05/16/2010   Qualifier: Diagnosis of  By: Perla MD, [name]     Stenosis of right carotid artery 02/01/2017    Past Surgical History:  Procedure Laterality Date   ESOPHAGOGASTRODUODENOSCOPY (EGD) WITH PROPOFOL  N/A 01/08/2023   Procedure: ESOPHAGOGASTRODUODENOSCOPY (EGD) WITH PROPOFOL ;  Surgeon: Jinny Carmine, MD;  Location: [facility] ENDOSCOPY;  Service: Endoscopy;  Laterality: N/A;   HIP SURGERY     right hip    Intestinal Blockage     LEFT HEART CATH AND CORONARY ANGIOGRAPHY N/A 06/15/2023   Procedure: LEFT HEART CATH AND  CORONARY ANGIOGRAPHY;  Surgeon: Anner Alm ORN, MD;  Location: [facility] INVASIVE CV LAB;  Service: Cardiovascular;  Laterality: N/A;   LOOP RECORDER INSERTION N/A 05/14/2017   Procedure: Loop Recorder Insertion;  Surgeon: Fernande Elspeth BROCKS, MD;  Location: [facility] INVASIVE CV LAB;  Service: Cardiovascular;  Laterality: N/A;   SKIN CANCER EXCISION     x2   TONSILLECTOMY      Current Medications: Current Meds  Medication Sig   acetaminophen  (TYLENOL ) 650 MG CR tablet Take 650 mg by mouth every 8 (eight) hours as needed.    apixaban  (ELIQUIS ) 2.5 MG TABS tablet Take 1 tablet (2.5 mg total) by mouth 2 (two) times daily.   Ascorbic Acid (VITAMIN C ) 1000 MG tablet Take 1,000 mg by mouth daily.   atorvastatin  (LIPITOR) 20 MG tablet Take 20 mg by mouth at bedtime.   b complex vitamins tablet Take 1 tablet by mouth daily.   cholecalciferol  (VITAMIN D ) 1000 units tablet Take 2,000 Units by mouth daily.   clopidogrel  (PLAVIX ) 75 MG tablet Take 1  tablet (75 mg total) by mouth daily.   cyanocobalamin  (VITAMIN B12) 1000 MCG tablet Take 1,000 mcg by mouth daily.   docusate sodium  (COLACE) 100 MG capsule Take 100 mg by mouth 2 (two) times daily.   empagliflozin (JARDIANCE) 25 MG TABS tablet Take 12.5 mg by mouth.   ezetimibe  (ZETIA ) 10 MG tablet Take 1 tablet (10 mg total) by mouth daily.   FLAXSEED, LINSEED, PO Take 2 g by mouth daily.   metoprolol  succinate (TOPROL -XL) 25 MG 24 hr tablet Take 12.5 mg by mouth daily.   nitroGLYCERIN  (NITROSTAT ) 0.4 MG SL tablet Place 1 tablet (0.4 mg total) under the tongue every 5 (five) minutes x 3 doses as needed for chest pain.   pantoprazole  (PROTONIX ) 40 MG tablet Take 1 tablet (40 mg total) by mouth 2 (two) times daily.   potassium chloride  (KLOR-CON ) 10 MEQ tablet Take 2 tablets (20 mEq total) by mouth every other day. Take with Torsemide    pregabalin  (LYRICA ) 150 MG capsule Take 150 mg by mouth at bedtime.   Probiotic Product (UP4 PROBIOTICS PO) Take 7.5 mg by mouth.   Psyllium (GERI-MUCIL) 25 % POWD Take by mouth.   ranolazine  (RANEXA ) 500 MG 12 hr tablet Take 1 tablet (500 mg total) by mouth 2 (two) times daily.   tamsulosin  (FLOMAX ) 0.4 MG CAPS capsule Take 2 capsules (0.8 mg total) by mouth daily.   zinc  sulfate 220 (50 Zn) MG capsule Take 1 tablet by mouth daily.   [DISCONTINUED] torsemide  (DEMADEX ) 20 MG tablet Take 1 tablet (20 mg total) by mouth every other day.    Allergies:   Penicillins, Gabapentin, Penicillin g, and Codeine   Social History   Socioeconomic History   Marital status: Married    Spouse name: Not on file   Number of children: Not on file   Years of education: Not on file   Highest education level: Not on file  Occupational History   Not on file  Tobacco Use   Smoking status: Never   Smokeless tobacco: Never  Vaping Use   Vaping status: Never Used  Substance and Sexual Activity   Alcohol use: Not Currently    Comment: occassionally   Drug use: No   Sexual  activity: Not on file  Other Topics Concern   Not on file  Social History Narrative   Not on file   Social Drivers of Health   Financial Resource Strain: Not on  file  Food Insecurity: No Food Insecurity (06/17/2023)   Hunger Vital Sign    Worried About Running Out of Food in the Last Year: Never true    Ran Out of Food in the Last Year: Never true  Transportation Needs: No Transportation Needs (10/23/2023)   Received from [facility] - Transportation    In the past 12 months, has lack of transportation kept you from medical appointments or from getting medications?: No    Lack of Transportation (Non-Medical): No  Physical Activity: Not on file  Stress: Not on file  Social Connections: Not on file     Family History:  The patient's family history includes Cancer in an other family member; Hyperlipidemia in an other family member; Hypertension in his mother and another family member; Other in an other family member.  ROS:   12-point review of systems is negative unless otherwise noted in the HPI.   EKGs/Labs/Other Studies Reviewed:    Studies reviewed were summarized above. The additional studies were reviewed today:  Limited echo 10/23/2023 ([facility]): CONCLUSION -------------------------------------------------------------------------------  LEFT VENTRICULAR SYSTOLIC FUNCTION NOT ASSESSED LVH Not Assessed  ESTIMATED EF: N/A  DIASTOLIC FUNCTION NOT ASSESSED  RIGHT VENTRICULAR SYSTOLIC FUNCTION NOT ASSESSED  VALVULAR REGURGITATION: No AR  VALVULAR STENOSIS: TAVR  ------------------------------------------------------------------------------------------  S/P TAVR  WELL SEATED 26mm TRANSCATH RESILIA ULTRA THV CM  NO OBVIOUS AORTIC REGURGITATION  __________   2D echo 09/05/2023 ([facility]): ECHOCARDIOGRAPHIC DESCRIPTIONS -----------------------------------------------  AORTIC ROOT          Size: Normal    Dissection: INDETERM FOR DISSECTION   AORTIC  VALVE      Leaflets: Tricuspid             Morphology: SEVERELY THICKENED      Mobility: PARTIALLY MOBILE   LEFT VENTRICLE                                      Anterior: HYPOCONTRACTILE          Size: Normal                                 Lateral: HYPOCONTRACTILE   Contraction: MOD GLOBAL DECREASE                     Septal: HYPOCONTRACTILE    Closest EF: 35% (Estimated)  Calc.EF: 36% (2D)      Apical: HYPOCONTRACTILE     LV masses: No Masses                             Inferior: HYPOCONTRACTILE           LVH: MILD LVH                             Posterior: HYPOCONTRACTILE  Dias.FxClass: INDETERMINATE   MITRAL VALVE      Leaflets: Normal                  Mobility: Fully mobile    Morphology: Normal   LEFT ATRIUM          Size: SEVERELY ENLARGED     LA masses: No masses  Normal IAS   MAIN PA          Size: Not seen   PULMONIC VALVE    Morphology: Normal      Mobility: Fully Mobile   RIGHT VENTRICLE          Size: MILDLY ENLARGED           Free wall: Normal   Contraction: Normal                    RV masses: No Masses         TAPSE:   1.2 cm,  Normal Range [>= 1.6 cm]   TRICUSPID VALVE      Leaflets: Normal                  Mobility: Fully mobile    Morphology: Normal   RIGHT ATRIUM          Size: MILDLY ENLARGED            RA Other: None     RA masses: No masses   PERICARDIUM        Fluid: No effusion   INFERIOR VENACAVA          Size: Normal     ABNORMAL RESPIRATORY COLLAPSE   DOPPLER ECHO and OTHER SPECIAL PROCEDURES ------------------------------------     Aortic: MILD AR                SEVERE AS      3.0 m/s peak vel   35 mmHg peak grad  21 mmHg mean grad 0.7 cm2 by DOPPLER   LVOT Diam: 2.0 cm. Resting LVOT Vel: 0.7 m/s. Dimensionless Index: 0.23      Mitral: MODERATE MR            No MS     MV Inflow E Vel.= nm* cm/s  MV Annulus E'Vel.= nm* cm/s  E/E'Ratio= nm*   Tricuspid: MILD TR                No TS             2.8 m/s peak TR vel   40  mmHg peak RV pressure   Pulmonary: No PR                  No PS       Other:             DEFINITY  CONTRAST SHOWS ENHANCED LV BORDERS   INTERPRETATION ---------------------------------------------------------------    MODERATE LV DYSFUNCTION (See above) WITH MILD LVH    NORMAL RIGHT VENTRICULAR SYSTOLIC FUNCTION    VALVULAR REGURGITATION: MILD AR, MODERATE MR, MILD TR    VALVULAR STENOSIS: SEVERE AS    MODERATE MR, EROA=0.2cm2, RVol=28mL    AVA (0.7cm2) AND DIMENSIONLESS INDEX (0.23) SUGGESTS SEVERE AORTIC    STENOSIS IN THE SETTING OF A LOW FLOW STATE INDICATED BY A STROKE VOLUME    INDEX OF 30 mL/m2    MILD TO MODERATE AR     Compared with prior Echo study on 04/11/2023: REDUCED LV FUNCTION  __________   2D echo 06/16/2023: 1. Findings suggestive of LCX territory ischemia/infarct. Left  ventricular ejection fraction, by estimation, is 35 to 40%. Left  ventricular ejection fraction by PLAX is 35 %. The left ventricle has  moderately decreased function. The left ventricle  demonstrates regional wall motion abnormalities (see scoring  diagram/findings for description). Left ventricular diastolic parameters  are indeterminate. There is severe akinesis  of the left ventricular,  basal-mid inferior wall, inferolateral wall and  lateral wall.   2. Right ventricular systolic function is mildly reduced. The right  ventricular size is normal. There is normal pulmonary artery systolic  pressure. The estimated right ventricular systolic pressure is 33.8 mmHg.   3. Left atrial size was moderately dilated.   4. The mitral valve is abnormal. Mild mitral valve regurgitation.   5. The aortic valve is tricuspid. Aortic valve regurgitation is not  visualized. Low flow, decreased gradient moderate aortic valve stenosis.  Aortic valve area, by VTI measures 0.82 cm. Aortic valve mean gradient  measures 16.0 mmHg. Aortic valve Vmax  measures 2.50 m/s. Peak gradient 16 mmHg, DI is 0.29.   6. The  inferior vena cava is dilated in size with <50% respiratory  variability, suggesting right atrial pressure of 15 mmHg.   Comparison(s): Changes from prior study are noted. 02/25/2020: LVEF 55%,  borderline mild AS -mean gradient 10 mmgHg.  __________   [facility] 06/15/2023:   Ost Cx to Prox Cx lesion is 100% stenosed -> the following circumflex fills berry but appears to be bridging collaterals.  Prox Cx to Mid Cx lesion is 80% stenosed.   Ost RCA to Prox RCA lesion is 95% stenosed.   Prox RCA stent has 55% in-stent restenosis   Prox RCA to Mid RCA lesion is 99% stenosed. Mid RCA lesion is 70% stenosed.   Dist RCA lesion is 99% stenosed.  TIMI I flow to the distal vessel.   1st Diag lesion is 55% stenosed.   Mid LAD lesion is 65% stenosed.   -----------------------------------   There is mild to moderate left ventricular systolic dysfunction.  The left ventricular ejection fraction is 35-45% by visual estimate. Basal to mid inferior akinesis with inferolateral hypokinesis.  LV end diastolic pressure is mildly elevated.   There is mild aortic valve stenosis.   -----------------------------------   Upon bed availability, the patient will be transferred to [facility] -> he will be placed under the Team C service   Plan will be to restart IV heparin  pending decision about appropriate intervention.  Will need to determine the risks of adding antiplatelet agent to his recently started Eliquis  based on his history of stroke and intracerebral hemorrhage.   POST-CATH DIAGNOSES Severe multivessel CAD with unclear culprit lesion:  Patient was chest pain-free, and hemodynamically stable upon completion of the catheterization.  Ostial-proximal LCx subtotal 99 to 100% occlusion with <TIMI I flow Mild diffuse proximal LAD disease with 70% stenosis after short mid vessel diagonal branch, the large Ramus-like first diagonal with focal 50% stenosis Heavily calcified ostial RCA (very difficult to engage) 95%  stenosis followed by diffuse 40 to 60% ISR of the proximal to mid vessel stent followed by 99 % subtotal occlusion after the stent crossing major RV marginal branch. Brisk collaterals fill the PDA   Moderate reduced EF with basal to mid inferior essentially akinesis and inferolateral hypokinesis. With an EDP of 17 mmHg.   RECOMMENDATIONS Admit and restart IV heparin  6 hours after Mynx closure High-dose high intensity statin No beta-blocker because history of tachybradycardia With the extent of disease he has, and either the circumflex or the RCA lesion being somewhat difficult I chose to delay attempted intervention.  Would recommend Transfer to [facility] to discuss possible atherectomy PCI of the RCA plus or minus attempting PCI of the circumflex.  Will need discussion between IC cardiologist and possibly CVTS given the fact that there  does appear to be LAD lesion as well. As he was chest pain-free and hemodynamically stable, I did not place balloon pump. __________   2D echo 04/11/2023 [facility]): INTERPRETATION  NORMAL LEFT VENTRICULAR SYSTOLIC FUNCTION  NORMAL RIGHT VENTRICULAR SYSTOLIC FUNCTION  MODERATE MITRAL VALVE INSUFFICIENCY  MILD TRICUSPID AND AORTIC VALVE INSUFFICIENCY  MILD-TO-MODERATE AORTIC VALVE STENOSIS  GLS: -15.0 %  __________   Zio patch 03/2023: Normal sinus rhythm Patient had a min HR of 38 bpm, max HR of 146 bpm, and avg HR of 61 bpm.   1 run of Ventricular Tachycardia occurred lasting 9 beats with a max rate of 125 bpm (avg 107 bpm).    408 Supraventricular Tachycardia runs occurred, the run with the fastest interval lasting 5 beats with a max rate of 146 bpm, the longest lasting 25.4 secs with an avg rate of 110 bpm.    Isolated SVEs were frequent (7.9%, 84016), SVE Couplets were occasional (1.4%, 7569), and SVE Triplets were rare (<1.0%, 2046).  Isolated VEs were occasional (1.8%, 19205), VE Couplets were rare (<1.0%, 82), and no VE Triplets were  present.  Ventricular Bigeminy and Trigeminy were present.    No patient triggered events recorded __________   Zio patch 02/2023: Normal sinus rhythm Patient had a min HR of 38 bpm, max HR of 135 bpm, and avg HR of 59 bpm.   156 Supraventricular Tachycardia/atrial tachycardia runs occurred, the run with the fastest interval lasting 8 beats with a max rate of 135 bpm, the longest lasting 19 beats with an avg rate of 100 bpm.   Idioventricular Rhythm was present. Isolated SVEs were frequent (12.4%, [id number]), SVE Couplets were occasional (4.3%, 21968), and SVE Triplets were occasional (1.5%, 5187).   Isolated VEs were rare (<1.0%, 4920), VE Couplets were rare (<1.0%, 163), and VE Triplets were rare (<1.0%, 3). Ventricular Bigeminy and Trigeminy were present.   1 patient triggered event recorded associated with normal sinus rhythm and PACs __________   See CV studies for more remote imaging   EKG:  EKG is not ordered today.    Recent Labs: 06/28/2024: ALT 16; BUN 27; Creatinine, Ser 1.64; Hemoglobin 12.7; Platelets 123; Potassium 3.2; Sodium 138  Recent Lipid Panel    Component Value Date/Time   CHOL 90 06/16/2023 0154   TRIG 30 06/16/2023 0154   TRIG 93 01/23/2010 0000   HDL 40 (L) 06/16/2023 0154   CHOLHDL 2.3 06/16/2023 0154   VLDL 6 06/16/2023 0154   LDLCALC 44 06/16/2023 0154    PHYSICAL EXAM:    VS:  BP 115/64   Pulse 63   Ht 5' 8 (1.727 m)   Wt 171 lb 3.2 oz (77.7 kg)   SpO2 97%   BMI 26.03 kg/m   BMI: Body mass index is 26.03 kg/m.  Physical Exam Vitals reviewed.  Constitutional:      Appearance: He is well-developed.  HENT:     Head: Normocephalic and atraumatic.  Eyes:     General:        Right eye: No discharge.        Left eye: No discharge.  Neck:     Vascular: JVD present.  Cardiovascular:     Rate and Rhythm: Normal rate. Rhythm irregular.     Heart sounds: Normal heart sounds, S1 normal and S2 normal. Heart sounds not distant. No midsystolic  click and no opening snap. No murmur heard.    No friction rub.  Pulmonary:     Effort: Pulmonary effort  is normal. No respiratory distress.     Breath sounds: Normal breath sounds. No decreased breath sounds, wheezing, rhonchi or rales.  Abdominal:     General: There is no distension.     Palpations: Abdomen is soft.     Tenderness: There is no abdominal tenderness.  Musculoskeletal:     Cervical back: Normal range of motion.     Right lower leg: Edema present.     Left lower leg: Edema present.     Comments: 1+ bilateral pretibial edema, improved from last visit.  Skin:    General: Skin is warm and dry.     Nails: There is no clubbing.  Neurological:     Mental Status: He is alert and oriented to person, place, and time.  Psychiatric:        Speech: Speech normal.        Behavior: Behavior normal.        Thought Content: Thought content normal.        Judgment: Judgment normal.     Wt Readings from Last 3 Encounters:  07/01/24 171 lb 3.2 oz (77.7 kg)  06/09/24 174 lb 9.6 oz (79.2 kg)  05/25/24 180 lb 8 oz (81.9 kg)     ASSESSMENT & PLAN:   Acute on chronic HFrEF: He remains volume up, though is improving and likely exacerbated by recently diagnosed atrial flutter.  Agree with outside office recommendation to transition torsemide  to 40 mg alternating with 20 mg every other day with close monitoring of renal function (patient wife indicates she will follow-up labs through the patient's PCP's office and forward the results to us  along with weights and blood pressure).  Cannot exclude a component of lower extremity swelling in the setting of Lyrica .  Echo pending to evaluate for progressive cardiomyopathy.  Persistent A-fib/flutter/PSVT/tachybradycardia syndrome: Likely contributing to volume overload.  His wife has preferred to pursue conservative rate control strategy over rhythm control with cardioversion in an effort to minimize sedation and decrease risk of progressive  cognitive decline.  In the short-term, we would also need to pursue TEE before DCCV given he has been maintained on apixaban  2.5 mg twice daily historically given prior bleeding.  Previously, he did not qualify for reduced dose of apixaban  though has been maintained on this dose.  However, more recently with renal decline he has qualified for reduced dose apixaban  2.5 mg twice daily which will be continued at this time.  The dosing of his anticoagulation has been discussed with the primary cardiologist who recommends we maintain the patient on lower dose apixaban .  He was previously being evaluated for possible Watchman at outside facility.  CHA2DS2-VASc at least 7 (CHF, HTN, age x 2, vascular disease, CVA x 2).   CAD involving the native coronary arteries without angina: He is without symptoms concerning for angina. Continue aggressive risk factor modification and secondary prevention including apixaban  in lieu of aspirin , atorvastatin  20 mg, ezetimibe  10 mg, and ranolazine  500 mg twice daily. No longer on beta-blocker secondary to bradycardic rates noted on prior cardiac monitoring. No plans for ischemic testing at this time.   Severe aortic stenosis: Status post TAVR in 10/2023 at [facility].  Apixaban  as outlined above.  SBE prophylaxis for dental procedures.  HTN: Blood pressure is well-controlled in the office today.  He remains on Toprol -XL 12.5 mg.  HLD: LDL 49 in 03/2024.  He remains on atorvastatin  20 mg and ezetimibe  10 mg.  Acute on CKD stage III: Improving on check 3  days ago.  Will need close monitoring of renal function while undergoing diuresis.  History of CVA: No new deficits.  On clopidogrel  and apixaban  as well as atorvastatin  and ezetimibe .    Disposition: F/u with Dr. Gollan or an APP in 2 weeks.   Medication Adjustments/Labs and Tests Ordered: Current medicines are reviewed at length with the patient today.  Concerns regarding medicines are outlined above. Medication changes, Labs  and Tests ordered today are summarized above and listed in the Patient Instructions accessible in Encounters.   Signed, Bernardino Bring, PA-C 07/01/2024 4:53 PM     [facility] [place] [address] [place], [place] [address] [phone]

## 2024-07-01 ENCOUNTER — Encounter: Payer: Self-pay | Admitting: Physician Assistant

## 2024-07-01 ENCOUNTER — Ambulatory Visit: Payer: Self-pay | Admitting: Physician Assistant

## 2024-07-01 ENCOUNTER — Ambulatory Visit: Attending: Physician Assistant | Admitting: Physician Assistant

## 2024-07-01 VITALS — BP 115/64 | HR 63 | Ht 68.0 in | Wt 171.2 lb

## 2024-07-01 DIAGNOSIS — N183 Chronic kidney disease, stage 3 unspecified: Secondary | ICD-10-CM | POA: Insufficient documentation

## 2024-07-01 DIAGNOSIS — I2510 Atherosclerotic heart disease of native coronary artery without angina pectoris: Secondary | ICD-10-CM | POA: Diagnosis present

## 2024-07-01 DIAGNOSIS — I471 Supraventricular tachycardia, unspecified: Secondary | ICD-10-CM | POA: Diagnosis present

## 2024-07-01 DIAGNOSIS — Z8673 Personal history of transient ischemic attack (TIA), and cerebral infarction without residual deficits: Secondary | ICD-10-CM | POA: Diagnosis present

## 2024-07-01 DIAGNOSIS — E785 Hyperlipidemia, unspecified: Secondary | ICD-10-CM | POA: Diagnosis present

## 2024-07-01 DIAGNOSIS — I495 Sick sinus syndrome: Secondary | ICD-10-CM | POA: Insufficient documentation

## 2024-07-01 DIAGNOSIS — I10 Essential (primary) hypertension: Secondary | ICD-10-CM | POA: Insufficient documentation

## 2024-07-01 DIAGNOSIS — I4892 Unspecified atrial flutter: Secondary | ICD-10-CM | POA: Insufficient documentation

## 2024-07-01 DIAGNOSIS — I5023 Acute on chronic systolic (congestive) heart failure: Secondary | ICD-10-CM | POA: Diagnosis present

## 2024-07-01 DIAGNOSIS — I4819 Other persistent atrial fibrillation: Secondary | ICD-10-CM | POA: Diagnosis present

## 2024-07-01 DIAGNOSIS — N179 Acute kidney failure, unspecified: Secondary | ICD-10-CM | POA: Insufficient documentation

## 2024-07-01 DIAGNOSIS — Z952 Presence of prosthetic heart valve: Secondary | ICD-10-CM | POA: Diagnosis present

## 2024-07-01 DIAGNOSIS — I350 Nonrheumatic aortic (valve) stenosis: Secondary | ICD-10-CM | POA: Diagnosis present

## 2024-07-01 DIAGNOSIS — I214 Non-ST elevation (NSTEMI) myocardial infarction: Secondary | ICD-10-CM

## 2024-07-01 LAB — ECHOCARDIOGRAM COMPLETE
AR max vel: 2.23 cm2
AV Area VTI: 2.03 cm2
AV Area mean vel: 2.09 cm2
AV Mean grad: 3.3 mmHg
AV Peak grad: 6 mmHg
Ao pk vel: 1.22 m/s
Area-P 1/2: 4.68 cm2
S' Lateral: 4.91 cm

## 2024-07-01 MED ORDER — TORSEMIDE 20 MG PO TABS: ORAL_TABLET | ORAL | 3 refills | Status: DC

## 2024-07-01 NOTE — Patient Instructions (Addendum)
 Medication Instructions:  Your physician recommends the following medication changes.  INCREASE: Torsemide  alternate 20 mg and 40 mg daily (the pill bottle will read: Alternate daily: 20 mg (1 tab) one day and 40 mg (2 tabs) the next day    *If you need a refill on your cardiac medications before your next appointment, please call your pharmacy*  Lab Work: To be done at Dr Auston office next week  Follow-Up: At [facility], you and your health needs are our priority.  As part of our continuing mission to provide you with exceptional heart care, our providers are all part of one team.  This team includes your primary Cardiologist (physician) and Advanced Practice Providers or APPs (Physician Assistants and Nurse Practitioners) who all work together to provide you with the care you need, when you need it.  Your next appointment:   2-3 week(s)  Provider:   You may see Timothy Gollan, MD or Bernardino Bring, PA-C

## 2024-07-01 NOTE — Progress Notes (Signed)
 Cardiac Individual Treatment Plan  Patient Details  Name: [name]. MRN: [id number] Date of Birth: 12/09/1932 Referring Provider:   Flowsheet Row Cardiac Rehab from 12/18/2023 in [facility] Cardiac and Pulmonary Rehab  Referring Provider Auston Purchase, MD    Initial Encounter Date:  Flowsheet Row Cardiac Rehab from 12/18/2023 in [facility] Cardiac and Pulmonary Rehab  Date 12/18/23    Visit Diagnosis: S/P TAVR (transcatheter aortic valve replacement)  NSTEMI (non-ST elevated myocardial infarction) [facility])  Patient's Home Medications on Admission:  Current Outpatient Medications:    acetaminophen  (TYLENOL ) 650 MG CR tablet, Take 650 mg by mouth every 8 (eight) hours as needed. , Disp: , Rfl:    apixaban  (ELIQUIS ) 2.5 MG TABS tablet, Take 1 tablet (2.5 mg total) by mouth 2 (two) times daily., Disp: 180 tablet, Rfl: 3   Ascorbic Acid (VITAMIN C ) 1000 MG tablet, Take 1,000 mg by mouth daily., Disp: , Rfl:    atorvastatin  (LIPITOR) 20 MG tablet, Take 20 mg by mouth at bedtime., Disp: , Rfl:    b complex vitamins tablet, Take 1 tablet by mouth daily., Disp: , Rfl:    cholecalciferol  (VITAMIN D ) 1000 units tablet, Take 2,000 Units by mouth daily., Disp: , Rfl:    clopidogrel  (PLAVIX ) 75 MG tablet, Take 1 tablet (75 mg total) by mouth daily., Disp: 90 tablet, Rfl: 0   cyanocobalamin  (VITAMIN B12) 1000 MCG tablet, Take 1,000 mcg by mouth daily., Disp: , Rfl:    docusate sodium  (COLACE) 100 MG capsule, Take 100 mg by mouth 2 (two) times daily., Disp: , Rfl:    empagliflozin (JARDIANCE) 25 MG TABS tablet, Take 12.5 mg by mouth., Disp: , Rfl:    ezetimibe  (ZETIA ) 10 MG tablet, Take 1 tablet (10 mg total) by mouth daily., Disp: 90 tablet, Rfl: 3   FLAXSEED, LINSEED, PO, Take 2 g by mouth daily., Disp: , Rfl:    metoprolol  succinate (TOPROL -XL) 25 MG 24 hr tablet, Take 12.5 mg by mouth daily., Disp: , Rfl:    nitroGLYCERIN  (NITROSTAT ) 0.4 MG SL tablet, Place 1 tablet (0.4 mg total) under the  tongue every 5 (five) minutes x 3 doses as needed for chest pain., Disp: 25 tablet, Rfl: 0   pantoprazole  (PROTONIX ) 40 MG tablet, Take 1 tablet (40 mg total) by mouth 2 (two) times daily., Disp: 60 tablet, Rfl: 1   potassium chloride  (KLOR-CON ) 10 MEQ tablet, Take 2 tablets (20 mEq total) by mouth every other day. Take with Torsemide , Disp: 90 tablet, Rfl: 3   pregabalin  (LYRICA ) 150 MG capsule, Take 150 mg by mouth at bedtime., Disp: , Rfl:    Probiotic Product (UP4 PROBIOTICS PO), Take 7.5 mg by mouth., Disp: , Rfl:    Psyllium (GERI-MUCIL) 25 % POWD, Take by mouth., Disp: , Rfl:    ranolazine  (RANEXA ) 500 MG 12 hr tablet, Take 1 tablet (500 mg total) by mouth 2 (two) times daily., Disp: 180 tablet, Rfl: 3   tamsulosin  (FLOMAX ) 0.4 MG CAPS capsule, Take 2 capsules (0.8 mg total) by mouth daily., Disp: 30 capsule, Rfl: 0   torsemide  (DEMADEX ) 20 MG tablet, Take 1 tablet (20 mg total) by mouth every other day., Disp: 45 tablet, Rfl: 3   zinc  sulfate 220 (50 Zn) MG capsule, Take 1 tablet by mouth daily., Disp: , Rfl:   Past Medical History: Past Medical History:  Diagnosis Date   Acute embolic stroke (HCC) 02/01/2017   BPH (benign prostatic hyperplasia)    Bradycardia 11/10/2015   Coronary  artery disease    Dizziness 08/21/2012   DYSPEPSIA 07/31/2010   Qualifier: Diagnosis of  By: Sebastian, RN, Ashley     Hyperlipidemia 05/16/2010   Qualifier: Diagnosis of  By: Perla MD, [name]      Hypertension    HYPERTENSION, BENIGN 05/16/2010   Qualifier: Diagnosis of  By: Perla MD, [name]     Stenosis of right carotid artery 02/01/2017    Tobacco Use: Social History   Tobacco Use  Smoking Status Never  Smokeless Tobacco Never    Labs: Review Flowsheet       Latest Ref Rng & Units 01/23/2010 06/15/2023 06/16/2023  Labs for ITP Cardiac and Pulmonary Rehab  Cholestrol 0 - 200 mg/dL 877  - 90   LDL (calc) 0 - 99 mg/dL 30.3  - 44   HDL-C >59 mg/dL 66.1  - 40   Trlycerides <150 mg/dL 93  - 30    Hemoglobin A1c 4.8 - 5.6 % 5.4  6.3  6.3  -    Details       Multiple values from one day are sorted in reverse-chronological order          Exercise Target Goals: Exercise Program Goal: Individual exercise prescription set using results from initial 6 min walk test and THRR while considering  patient's activity barriers and safety.   Exercise Prescription Goal: Initial exercise prescription builds to 30-45 minutes a day of aerobic activity, 2-3 days per week.  Home exercise guidelines will be given to patient during program as part of exercise prescription that the participant will acknowledge.   Education: Aerobic Exercise: - Group verbal and visual presentation on the components of exercise prescription. Introduces F.I.T.T principle from ACSM for exercise prescriptions.  Reviews F.I.T.T. principles of aerobic exercise including progression. Written material given at graduation. Flowsheet Row Cardiac Rehab from 12/18/2023 in [facility] Cardiac and Pulmonary Rehab  Education need identified 12/18/23    Education: Resistance Exercise: - Group verbal and visual presentation on the components of exercise prescription. Introduces F.I.T.T principle from ACSM for exercise prescriptions  Reviews F.I.T.T. principles of resistance exercise including progression. Written material given at graduation.    Education: Exercise & Equipment Safety: - Individual verbal instruction and demonstration of equipment use and safety with use of the equipment. Flowsheet Row Cardiac Rehab from 12/18/2023 in [facility] Cardiac and Pulmonary Rehab  Date 12/18/23  Educator MB  Instruction Review Code 1- Verbalizes Understanding    Education: Exercise Physiology & General Exercise Guidelines: - Group verbal and written instruction with models to review the exercise physiology of the cardiovascular system and associated critical values. Provides general exercise guidelines with specific guidelines to those with heart  or lung disease.  Flowsheet Row Cardiac Rehab from 12/18/2023 in [facility] Cardiac and Pulmonary Rehab  Education need identified 12/18/23    Education: Flexibility, Balance, Mind/Body Relaxation: - Group verbal and visual presentation with interactive activity on the components of exercise prescription. Introduces F.I.T.T principle from ACSM for exercise prescriptions. Reviews F.I.T.T. principles of flexibility and balance exercise training including progression. Also discusses the mind body connection.  Reviews various relaxation techniques to help reduce and manage stress (i.e. Deep breathing, progressive muscle relaxation, and visualization). Balance handout provided to take home. Written material given at graduation.   Activity Barriers & Risk Stratification:   6 Minute Walk:  6 Minute Walk     Row Name 04/22/24 1125         6 Minute Walk   Phase Discharge  Distance 750 feet     Distance % Change -6.8 %     Distance Feet Change -55 ft     Walk Time 6 minutes     # of Rest Breaks 0     MPH 1.4     METS 1     RPE 13     Perceived Dyspnea  1     VO2 Peak 3.19     Symptoms No     Resting HR 71 bpm     Resting BP 116/62     Resting Oxygen Saturation  97 %     Exercise Oxygen Saturation  during 6 min walk 95 %     Max Ex. HR 84 bpm     Max Ex. BP 132/72        Oxygen Initial Assessment:   Oxygen Re-Evaluation:   Oxygen Discharge (Final Oxygen Re-Evaluation):   Initial Exercise Prescription:   Perform Capillary Blood Glucose checks as needed.  Exercise Prescription Changes:   Exercise Prescription Changes     Row Name 01/07/24 1500 01/23/24 1600 02/04/24 1500 02/17/24 1100 02/17/24 1400     Response to Exercise   Blood Pressure (Admit) 124/70 122/64 120/64 -- 122/70   Blood Pressure (Exercise) 126/70 130/80 118/62 -- 140/80   Blood Pressure (Exit) 124/70 110/72 118/60 -- 122/68   Heart Rate (Admit) 66 bpm 69 bpm 75 bpm -- 77 bpm   Heart Rate (Exercise) 101  bpm 100 bpm 95 bpm -- 98 bpm   Heart Rate (Exit) 73 bpm 67 bpm 78 bpm -- 74 bpm   Rating of Perceived Exertion (Exercise) 13 13 14  -- 13   Perceived Dyspnea (Exercise) 0 0 -- -- --   Symptoms none none none -- none   Comments First 2 weeks of exercise -- -- -- --   Duration Progress to 30 minutes of  aerobic without signs/symptoms of physical distress Progress to 30 minutes of  aerobic without signs/symptoms of physical distress Continue with 30 min of aerobic exercise without signs/symptoms of physical distress. Continue with 30 min of aerobic exercise without signs/symptoms of physical distress. Continue with 30 min of aerobic exercise without signs/symptoms of physical distress.   Intensity THRR New THRR New THRR unchanged THRR unchanged THRR unchanged     Progression   Progression Continue to progress workloads to maintain intensity without signs/symptoms of physical distress. Continue to progress workloads to maintain intensity without signs/symptoms of physical distress. Continue to progress workloads to maintain intensity without signs/symptoms of physical distress. Continue to progress workloads to maintain intensity without signs/symptoms of physical distress. Continue to progress workloads to maintain intensity without signs/symptoms of physical distress.   Average METs 1.96 1.96 1.8 1.8 1.86     Resistance Training   Training Prescription Yes Yes Yes Yes Yes   Weight 2lb 2lb 2lb 2lb 2lb   Reps 10-15 10-15 10-15 10-15 10-15     Interval Training   Interval Training No No No No No     NuStep   Level 2  T6 3 2 2  --   Minutes 15 15 15 15  --   METs 1.9 1.8 1.36 1.36 --     T5 Nustep   Level -- 2  T6 2  T6 2  T6 2  T6   Minutes -- 15 15 15 15    METs -- 1.6 2 2  1.9     Biostep-RELP   Level 1 -- -- -- --   Minutes 15 -- -- -- --  METs 2 -- -- -- --     Track   Laps 21 21 21 21 20    Minutes 15 15 15 15 15    METs 2.14 2.14 2.14 2.14 2.09     Home Exercise Plan   Plans to  continue exercise at -- -- -- Home (comment)  walking Home (comment)  walking   Frequency -- -- -- Add 2 additional days to program exercise sessions. Add 2 additional days to program exercise sessions.   Initial Home Exercises Provided -- -- -- 02/17/24 02/17/24     Oxygen   Maintain Oxygen Saturation 88% or higher 88% or higher 88% or higher 88% or higher 88% or higher    Row Name 03/05/24 1600 03/20/24 0700 04/02/24 1100 04/16/24 1500 04/28/24 1300     Response to Exercise   Blood Pressure (Admit) 122/82 112/58 114/62 114/68 132/72   Blood Pressure (Exercise) 124/68 -- -- -- --   Blood Pressure (Exit) 114/66 126/60 142/64 126/72 122/68   Heart Rate (Admit) 67 bpm 66 bpm 76 bpm 76 bpm 70 bpm   Heart Rate (Exercise) 95 bpm 103 bpm 103 bpm 97 bpm 106 bpm   Heart Rate (Exit) 74 bpm 74 bpm 87 bpm 75 bpm 73 bpm   Rating of Perceived Exertion (Exercise) 13 15 15 13 13    Symptoms none none none none none   Duration Continue with 30 min of aerobic exercise without signs/symptoms of physical distress. Continue with 30 min of aerobic exercise without signs/symptoms of physical distress. Continue with 30 min of aerobic exercise without signs/symptoms of physical distress. Continue with 30 min of aerobic exercise without signs/symptoms of physical distress. Continue with 30 min of aerobic exercise without signs/symptoms of physical distress.   Intensity THRR unchanged THRR unchanged THRR unchanged THRR unchanged THRR unchanged     Progression   Progression Continue to progress workloads to maintain intensity without signs/symptoms of physical distress. Continue to progress workloads to maintain intensity without signs/symptoms of physical distress. Continue to progress workloads to maintain intensity without signs/symptoms of physical distress. Continue to progress workloads to maintain intensity without signs/symptoms of physical distress. Continue to progress workloads to maintain intensity without  signs/symptoms of physical distress.   Average METs 2.34 2.21 2.09 2.14 2.21     Resistance Training   Training Prescription Yes Yes Yes Yes Yes   Weight 2lb 2 lb 2 lb 2 lb 2 lb   Reps 10-15 10-15 10-15 10-15 10-15     Interval Training   Interval Training No No No No No     NuStep   Level 1 1  T6 nustep 1  T6 nustep 2  T6 nustep 2  T6 nustep   Minutes 15 15 15 15 15    METs 2.4 3.1 2.5 1.9 2     Track   Laps 35 22 17 17 20    Minutes 30 15 15 15 15    METs 2.9 2.2 1.92 1.92 2.09     Home Exercise Plan   Plans to continue exercise at Home (comment)  walking Home (comment)  walking Home (comment)  walking Home (comment)  walking Home (comment)  walking   Frequency Add 2 additional days to program exercise sessions. Add 2 additional days to program exercise sessions. Add 2 additional days to program exercise sessions. Add 2 additional days to program exercise sessions. Add 2 additional days to program exercise sessions.   Initial Home Exercises Provided 02/17/24 02/17/24 02/17/24 02/17/24 02/17/24  Oxygen   Maintain Oxygen Saturation 88% or higher 88% or higher 88% or higher 88% or higher 88% or higher    Row Name 05/15/24 0700             Response to Exercise   Blood Pressure (Admit) 122/78       Blood Pressure (Exit) 124/68       Heart Rate (Admit) 70 bpm       Heart Rate (Exercise) 85 bpm       Heart Rate (Exit) 68 bpm       Rating of Perceived Exertion (Exercise) 15       Symptoms none       Duration Continue with 30 min of aerobic exercise without signs/symptoms of physical distress.       Intensity THRR unchanged         Progression   Progression Continue to progress workloads to maintain intensity without signs/symptoms of physical distress.       Average METs 2.28         Resistance Training   Training Prescription Yes       Weight 2 lb       Reps 10-15         Interval Training   Interval Training No         NuStep   Level 2  T6 nustep       Minutes  15       METs 2         Track   Laps 21       Minutes 15       METs 2.14         Home Exercise Plan   Plans to continue exercise at Home (comment)  walking       Frequency Add 2 additional days to program exercise sessions.       Initial Home Exercises Provided 02/17/24         Oxygen   Maintain Oxygen Saturation 88% or higher          Exercise Comments:   Exercise Goals and Review:   Exercise Goals Re-Evaluation :  Exercise Goals Re-Evaluation     Row Name 01/07/24 1528 01/23/24 1654 02/04/24 1526 02/17/24 1145 02/17/24 1455     Exercise Goal Re-Evaluation   Exercise Goals Review Increase Physical Activity;Increase Strength and Stamina;Understanding of Exercise Prescription Increase Physical Activity;Increase Strength and Stamina;Understanding of Exercise Prescription Increase Physical Activity;Increase Strength and Stamina;Understanding of Exercise Prescription Increase Physical Activity;Able to understand and use rate of perceived exertion (RPE) scale;Knowledge and understanding of Target Heart Rate Range (THRR);Understanding of Exercise Prescription;Increase Strength and Stamina;Able to check pulse independently Increase Physical Activity;Understanding of Exercise Prescription;Increase Strength and Stamina   Comments Kyng is off to a good start in the program. He was able to increase his level on the T6 nustep to level 2. He was also able to maintain his track laps, and level on the biostep. We will continue to monitor his progress in the program. Tyresse is doing well in rehab. He was recently able to increase his level on the T4 nustep from 2 to 3. He was also able to maintain 21 laps on the track in 15 minutes. We will continue to monitor his progress in the program. Sirr is doing well in rehab. He was able to maintain 21 laps on the track and level 2 on the T6 nustep. We will continue to monitor his progress in the program. Reviewed  home exercise with pt today.  Pt plans to  walk at home for exercise.  Reviewed THR, pulse, RPE, sign and symptoms, pulse oximetery and when to call 911 or MD.  Also discussed weather considerations and indoor options.  Pt voiced understanding. Robley is doing well in rehab. He walked 20 laps on the track and was able to maintain level 2 on the T6. We will continue to monitor his progress in the program.   Expected Outcomes Short: Continue to increase level on the T6 nustep, and push for more laps on the track. Long: Continue exercise to improve strength and stamina. Short: Continue to increase level on the T6 nustep, and push for more laps on the track. Long: Continue exercise to improve strength and stamina. Short: Continue to push for more laps on the track. Long: Continue exercise to improve strength and stamina. Short: walk at home 1-2 days a week on off days of rehab, following recommendations provided by staff in home exercise review. Long: maintain independent exercise routine upon graduation from cardiac rehab. Short: Push for more laps on the track and try level 3 on the T6 nustep. Long: Continue exercise to improve strength and stamina.    Row Name 03/05/24 1609 03/20/24 0754 04/01/24 1125 04/02/24 1113 04/16/24 1535     Exercise Goal Re-Evaluation   Exercise Goals Review Increase Physical Activity;Understanding of Exercise Prescription;Increase Strength and Stamina Increase Physical Activity;Understanding of Exercise Prescription;Increase Strength and Stamina Increase Physical Activity;Understanding of Exercise Prescription;Increase Strength and Stamina Increase Physical Activity;Understanding of Exercise Prescription;Increase Strength and Stamina Increase Physical Activity;Understanding of Exercise Prescription;Increase Strength and Stamina   Comments Mckade continues to do well in rehab. He was able to maintain a level 1 on the T6 nustep. He was also able to increase from 20 to 35 laps on the track. We will continue to monitor his  progress in the program. Tc continues to do well in rehab. He was able to maintain a level 1 on the T6 nustep. He was also able to increase from 20 to 22 laps on the track when walking for 15 minutes. We will continue to monitor his progress in the program. Rutilio is doing well and continues to improve. He states that he continues to do exercise at home, which consists of walking outside. We will continue to monitor his progress in the program. Jaryan has only attended two sessions since the last review. He has continued to work at level 1 on the T6 nustep. He also saw a decrease in his laps walked on the track from 22 to 17 laps. We will continue to monitor his progress in the program. Loyal continues to do well in rehab. He improved to  level 2 on the T6 nustep. He also continues to walk 17 laps on the track after previously walking 22 laps. We will continue to monitor his progress in the program.   Expected Outcomes Short: Continue to increase track laps, return to level 2 on the T6 nustep. Long: Continue exercise to improve strength and stamina. Short: Increase to level 2 on the T6 nustep. Long: Continue exercise to improve strength and stamina. Short: Continue to exercise at home. Long: Continue exercise to improve strength and stamina. Short: Attend rehab more consistently. Long: Continue exercise to improve strength and stamina. Short: Increase laps on the track back up to 22 laps. Long: Continue exercise to improve strength and stamina.    Row Name 04/28/24 1349 05/04/24 1119 05/15/24 0748 05/28/24 1551 06/09/24 1027  Exercise Goal Re-Evaluation   Exercise Goals Review Increase Physical Activity;Understanding of Exercise Prescription;Increase Strength and Stamina Increase Physical Activity;Increase Strength and Stamina;Understanding of Exercise Prescription Increase Physical Activity;Increase Strength and Stamina;Understanding of Exercise Prescription Increase Physical Activity;Increase  Strength and Stamina;Understanding of Exercise Prescription Increase Physical Activity;Increase Strength and Stamina;Understanding of Exercise Prescription   Comments Josef continues to do well in rehab. He recently completed his post . He continues to work at level 2 on the T6 nustep. He also increased back up to 20 laps on the track after previously walking only 17 laps. We will continue to monitor his progress until he graduates from the program. Xzavien is walking at home most every day. He walks up and down his hallway as he can tolerate. Carrington continues to do well in rehab. He increased back up to 21 laps walked on the track. He also continues to work at level 2 on the T6 nustep and use 2 lb hand weights for exercise. We will continue to monitor his progress in the program. Kaelin has not attended rehab since the last review. He last attended the program on 05/06/2024. He only has 3 sessions left until he graduates from the program. Jamie has not attended rehab since the last review. He last attended the program on 05/06/2024. He only has 3 sessions left until he graduates from the program.   Expected Outcomes Short: Graduate. Long: Continue to exercise independently. Short: continue to make an effort to be active at home.Long: maintain exercise routine upon graduation from program. Short: Continue to push for more laps on the track. Long: Continue exercise to improve strength and stamina. Short: Return to rehab and graduate from the program. Long: Continue exercise to improve strength and stamina. Short: Return to rehab and graduate from the program. Long: Continue exercise to improve strength and stamina.    Row Name 06/23/24 1341             Exercise Goal Re-Evaluation   Exercise Goals Review Increase Physical Activity;Increase Strength and Stamina;Understanding of Exercise Prescription       Comments Ashaun has not attended rehab since the last review. He last attended the program on  05/06/2024. We recently called to check on pt and his wife answered and stated that he was working on getting his medication sorted out and has a doctor's appt on 06/25/2024. She stated that she would update us  after the appt. We will continue to monitor his progress in the program.       Expected Outcomes Short: Return to rehab when appropriate. Long: Graduate.          Discharge Exercise Prescription (Final Exercise Prescription Changes):  Exercise Prescription Changes - 05/15/24 0700       Response to Exercise   Blood Pressure (Admit) 122/78    Blood Pressure (Exit) 124/68    Heart Rate (Admit) 70 bpm    Heart Rate (Exercise) 85 bpm    Heart Rate (Exit) 68 bpm    Rating of Perceived Exertion (Exercise) 15    Symptoms none    Duration Continue with 30 min of aerobic exercise without signs/symptoms of physical distress.    Intensity THRR unchanged      Progression   Progression Continue to progress workloads to maintain intensity without signs/symptoms of physical distress.    Average METs 2.28      Resistance Training   Training Prescription Yes    Weight 2 lb    Reps 10-15  Interval Training   Interval Training No      NuStep   Level 2   T6 nustep   Minutes 15    METs 2      Track   Laps 21    Minutes 15    METs 2.14      Home Exercise Plan   Plans to continue exercise at Home (comment)   walking   Frequency Add 2 additional days to program exercise sessions.    Initial Home Exercises Provided 02/17/24      Oxygen   Maintain Oxygen Saturation 88% or higher          Nutrition:  Target Goals: Understanding of nutrition guidelines, daily intake of sodium 1500mg , cholesterol 200mg , calories 30% from fat and 7% or less from saturated fats, daily to have 5 or more servings of fruits and vegetables.  Education: All About Nutrition: -Group instruction provided by verbal, written material, interactive activities, discussions, models, and posters to present  general guidelines for heart healthy nutrition including fat, fiber, MyPlate, the role of sodium in heart healthy nutrition, utilization of the nutrition label, and utilization of this knowledge for meal planning. Follow up email sent as well. Written material given at graduation. Flowsheet Row Cardiac Rehab from 12/18/2023 in [facility] Cardiac and Pulmonary Rehab  Education need identified 12/18/23    Biometrics:   Post Biometrics - 04/22/24 1127        Post  Biometrics   Height 5' 8.4 (1.737 m)    Weight 174 lb 1.6 oz (79 kg)    Waist Circumference 41.5 inches    Hip Circumference 40 inches    Waist to Hip Ratio 1.04 %    BMI (Calculated) 26.17    Single Leg Stand 0 seconds          Nutrition Therapy Plan and Nutrition Goals:   Nutrition Assessments:  MEDIFICTS Score Key: >=70 Need to make dietary changes  40-70 Heart Healthy Diet <= 40 Therapeutic Level Cholesterol Diet  Flowsheet Row Cardiac Rehab from 12/18/2023 in [facility] Cardiac and Pulmonary Rehab  Picture Your Plate Total Score on Admission 67   Picture Your Plate Scores: <59 Unhealthy dietary pattern with much room for improvement. 41-50 Dietary pattern unlikely to meet recommendations for good health and room for improvement. 51-60 More healthful dietary pattern, with some room for improvement.  >60 Healthy dietary pattern, although there may be some specific behaviors that could be improved.    Nutrition Goals Re-Evaluation:  Nutrition Goals Re-Evaluation     Row Name 04/01/24 1116 05/04/24 1127           Goals   Nutrition Goal RD appointment deferred RD appointment deferred         Nutrition Goals Discharge (Final Nutrition Goals Re-Evaluation):  Nutrition Goals Re-Evaluation - 05/04/24 1127       Goals   Nutrition Goal RD appointment deferred          Psychosocial: Target Goals: Acknowledge presence or absence of significant depression and/or stress, maximize coping skills, provide positive  support system. Participant is able to verbalize types and ability to use techniques and skills needed for reducing stress and depression.   Education: Stress, Anxiety, and Depression - Group verbal and visual presentation to define topics covered.  Reviews how body is impacted by stress, anxiety, and depression.  Also discusses healthy ways to reduce stress and to treat/manage anxiety and depression.  Written material given at graduation. Flowsheet Row Cardiac Rehab from 07/15/2023  in [facility] Cardiac and Pulmonary Rehab  Education need identified 07/15/23    Education: Sleep Hygiene -Provides group verbal and written instruction about how sleep can affect your health.  Define sleep hygiene, discuss sleep cycles and impact of sleep habits. Review good sleep hygiene tips.    Initial Review & Psychosocial Screening:   Quality of Life Scores:   Scores of 19 and below usually indicate a poorer quality of life in these areas.  A difference of  2-3 points is a clinically meaningful difference.  A difference of 2-3 points in the total score of the Quality of Life Index has been associated with significant improvement in overall quality of life, self-image, physical symptoms, and general health in studies assessing change in quality of life.  PHQ-9: Review Flowsheet       05/04/2024 12/18/2023 07/15/2023  Depression screen PHQ 2/9  Decreased Interest 0 0 0  Down, Depressed, Hopeless 0 0 0  PHQ - 2 Score 0 0 0  Altered sleeping 0 0 0  Tired, decreased energy 0 1 2  Change in appetite 0 0 1  Feeling bad or failure about yourself  0 0 0  Trouble concentrating 0 2 3  Moving slowly or fidgety/restless 0 3 0  Suicidal thoughts 0 0 0  PHQ-9 Score 0 6 6  Difficult doing work/chores - Extremely dIfficult Somewhat difficult   Interpretation of Total Score  Total Score Depression Severity:  1-4 = Minimal depression, 5-9 = Mild depression, 10-14 = Moderate depression, 15-19 = Moderately severe  depression, 20-27 = Severe depression   Psychosocial Evaluation and Intervention:   Psychosocial Re-Evaluation:  Psychosocial Re-Evaluation     Row Name 04/01/24 1112 05/04/24 1128           Psychosocial Re-Evaluation   Current issues with None Identified None Identified      Comments Christopherjame states that he does not have any stress at this time. He has a good support system made up of his two sons and his wife. He reports no sleep concerns at this time. PHQ depresison screening questionaire was re-done. Patient's score went from a 6 to a 0. He states he is doing ok mentally, with his only concern not being able to do all the things he used to be able to do.      Expected Outcomes Short: Continue to attend cardiac rehab for stress relief. Long: Continue to maintain positive outlook. Short: continue to attend cardiac rehab  for mental health benefits. Long: maintain good mental health routine.      Interventions Encouraged to attend Cardiac Rehabilitation for the exercise --      Continue Psychosocial Services  Follow up required by staff Follow up required by staff         Psychosocial Discharge (Final Psychosocial Re-Evaluation):  Psychosocial Re-Evaluation - 05/04/24 1128       Psychosocial Re-Evaluation   Current issues with None Identified    Comments PHQ depresison screening questionaire was re-done. Patient's score went from a 6 to a 0. He states he is doing ok mentally, with his only concern not being able to do all the things he used to be able to do.    Expected Outcomes Short: continue to attend cardiac rehab  for mental health benefits. Long: maintain good mental health routine.    Continue Psychosocial Services  Follow up required by staff          Vocational Rehabilitation: Provide vocational rehab assistance to qualifying candidates.  Vocational Rehab Evaluation & Intervention:   Education: Education Goals: Education classes will be provided on a variety of  topics geared toward better understanding of heart health and risk factor modification. Participant will state understanding/return demonstration of topics presented as noted by education test scores.  Learning Barriers/Preferences:   General Cardiac Education Topics:  AED/CPR: - Group verbal and written instruction with the use of models to demonstrate the basic use of the AED with the basic ABC's of resuscitation.   Anatomy and Cardiac Procedures: - Group verbal and visual presentation and models provide information about basic cardiac anatomy and function. Reviews the testing methods done to diagnose heart disease and the outcomes of the test results. Describes the treatment choices: Medical Management, Angioplasty, or Coronary Bypass Surgery for treating various heart conditions including Myocardial Infarction, Angina, Valve Disease, and Cardiac Arrhythmias.  Written material given at graduation. Flowsheet Row Cardiac Rehab from 12/18/2023 in [facility] Cardiac and Pulmonary Rehab  Education need identified 12/18/23    Medication Safety: - Group verbal and visual instruction to review commonly prescribed medications for heart and lung disease. Reviews the medication, class of the drug, and side effects. Includes the steps to properly store meds and maintain the prescription regimen.  Written material given at graduation.   Intimacy: - Group verbal instruction through game format to discuss how heart and lung disease can affect sexual intimacy. Written material given at graduation.. Flowsheet Row Cardiac Rehab from 12/18/2023 in [facility] Cardiac and Pul[facility] Rehab  Education need identified 12/18/23    Know Your Numbers and Heart Failure: - Group verbal and visual instruction to discuss disease risk factors for cardiac and pulmonary disease and treatment options.  Reviews associated critical values for Overweight/Obesity, Hypertension, Cholesterol, and Diabetes.  Discusses basics of heart  failure: signs/symptoms and treatments.  Introduces Heart Failure Zone chart for action plan for heart failure.  Written material given at graduation.   Infection Prevention: - Provides verbal and written material to individual with discussion of infection control including proper hand washing and proper equipment cleaning during exercise session. Flowsheet Row Cardiac Rehab from 12/18/2023 in [facility] Cardiac and Pulmonary Rehab  Date 12/18/23  Educator MB  Instruction Review Code 1- Verbalizes Understanding    Falls Prevention: - Provides verbal and written material to individual with discussion of falls prevention and safety. Flowsheet Row Cardiac Rehab from 12/18/2023 in [facility] Cardiac and Pulmonary Rehab  Date 12/18/23  Educator MB  Instruction Review Code 1- Verbalizes Understanding    Other: -Provides group and verbal instruction on various topics (see comments)   Knowledge Questionnaire Score:   Core Components/Risk Factors/Patient Goals at Admission:   Education:Diabetes - Individual verbal and written instruction to review signs/symptoms of diabetes, desired ranges of glucose level fasting, after meals and with exercise. Acknowledge that pre and post exercise glucose checks will be done for 3 sessions at entry of program.   Core Components/Risk Factors/Patient Goals Review:   Goals and Risk Factor Review     Row Name 04/01/24 1118 05/04/24 1130           Core Components/Risk Factors/Patient Goals Review   Personal Goals Review Weight Management/Obesity;Hypertension Hypertension      Review Byran states that he does not take his BP at home very frequently. His BP has been within a healthy range on program days, today he measured 118/68. Zylan is still maintaining his weight, he state that his wife manages his diet. Adriaan was asked if he was checking his BP at  home again. He does not recall doing so. He was encouraged to start doing so.      Expected Outcomes Short:  Check BP at home more regularly. Long: Continue to manage risk factors. Short: Check BP at home more regularly. Long: Continue to manage risk factors.         Core Components/Risk Factors/Patient Goals at Discharge (Final Review):   Goals and Risk Factor Review - 05/04/24 1130       Core Components/Risk Factors/Patient Goals Review   Personal Goals Review Hypertension    Review Jordany was asked if he was checking his BP at home again. He does not recall doing so. He was encouraged to start doing so.    Expected Outcomes Short: Check BP at home more regularly. Long: Continue to manage risk factors.          ITP Comments:  ITP Comments     Row Name 01/15/24 0727 02/12/24 0757 03/11/24 0732 04/08/24 0827 05/06/24 0915   ITP Comments 30 Day review completed. Medical Director ITP review done, changes made as directed, and signed approval by Medical Director.    new to program 30 Day review completed. Medical Director ITP review done, changes made as directed, and signed approval by Medical Director. 30 Day review completed. Medical Director ITP review done, changes made as directed, and signed approval by Medical Director. 30 Day review completed. Medical Director ITP review done, changes made as directed, and signed approval by Medical Director. 30 Day review completed. Medical Director ITP review done, changes made as directed, and signed approval by Medical Director.    Row Name 06/03/24 0753 07/01/24 0744         ITP Comments 30 Day review completed. Medical Director ITP review done, changes made as directed, and signed approval by Medical Director. 30 Day review completed. Medical Director ITP review done, changes made as directed, and signed approval by Medical Director.         Comments: 30 day review

## 2024-07-02 NOTE — Telephone Encounter (Signed)
 Called and spoke to patient's wife to discuss echo findings.  They will have follow-up labs drawn through PCPs office on 8/7 and forward those results to us .  If blood pressure, renal function, and potassium allow, would attempt initiate spironolactone 12.5 mg daily, though would need to be cautious given underlying renal dysfunction with continuation of Jardiance and Toprol -XL.  I doubt blood pressure will allow for addition of ACE inhibitor/ARB/ARNI given readings at home frequently in the low 100s systolic.  Query if the slight reduction in his EF is related to atrial flutter.  Patient's wife has been hesitant to pursue DCCV given concerns with sedation with underlying dementia, and with concerns regarding apixaban  dosing (has historically been maintained on lower dose apixaban  given prior bleeding though did not meet reduced dosing criteria for apixaban  until 06/17/2024 (age and serum creatinine).  Now that he is on appropriately dosed apixaban , query if we could attempt pharmacological cardioversion with amiodarone, this can be discussed with patient and wife at follow-up visit with primary cardiologist on 07/20/2024.

## 2024-07-06 ENCOUNTER — Encounter: Payer: Self-pay | Admitting: *Deleted

## 2024-07-06 DIAGNOSIS — Z952 Presence of prosthetic heart valve: Secondary | ICD-10-CM

## 2024-07-06 NOTE — Progress Notes (Signed)
 Discharge Summary   Dal Blew DOB: [birth year]   Zachary graduated today from  rehab with 33 sessions completed.  Details of the patient's exercise prescription and what He needs to do in order to continue the prescription and progress were discussed with patient.  Patient was given a copy of prescription and goals.  Patient verbalized understanding. Caster plans to continue to exercise by walking at home.        12/18/2023 04/22/24    1501 1125   6 Minute Walk    Phase Initial Discharge   Distance 805 feet 750 feet   Distance % Change -- -6.8 %   Distance Feet Change -- -55 ft   Walk Time 6 minutes 6 minutes   # of Rest Breaks 0 0   MPH 1.52 1.4   METS 1.16 1   RPE 11 13   Perceived Dyspnea 0 1   VO2 Peak 4.06 3.19   Symptoms No No   Resting HR 64 bpm 71 bpm   Resting BP 146/86 116/62   Resting Oxygen Saturation 97 % 97 %   Exercise Oxygen Saturation during 6 min walk 96 % 95 %   Max Ex. HR 90 bpm 84 bpm   Max Ex. BP 130/60 132/72   2 Minute Post BP 120/64

## 2024-07-06 NOTE — Progress Notes (Signed)
 Cardiac Individual Treatment Plan  Patient Details  Name: [name]. MRN: [id number] Date of Birth: 01-Mar-1933 Referring Provider:   Flowsheet Row Cardiac Rehab from 12/18/2023 in [facility] Cardiac and Pulmonary Rehab  Referring Provider Auston Purchase, MD    Initial Encounter Date:  Flowsheet Row Cardiac Rehab from 12/18/2023 in [facility] Cardiac and Pulmonary Rehab  Date 12/18/23    Visit Diagnosis: S/P TAVR (transcatheter aortic valve replacement)  Patient's Home Medications on Admission:  Current Outpatient Medications:    acetaminophen  (TYLENOL ) 650 MG CR tablet, Take 650 mg by mouth every 8 (eight) hours as needed. , Disp: , Rfl:    apixaban  (ELIQUIS ) 2.5 MG TABS tablet, Take 1 tablet (2.5 mg total) by mouth 2 (two) times daily., Disp: 180 tablet, Rfl: 3   Ascorbic Acid (VITAMIN C ) 1000 MG tablet, Take 1,000 mg by mouth daily., Disp: , Rfl:    atorvastatin  (LIPITOR) 20 MG tablet, Take 20 mg by mouth at bedtime., Disp: , Rfl:    b complex vitamins tablet, Take 1 tablet by mouth daily., Disp: , Rfl:    cholecalciferol  (VITAMIN D ) 1000 units tablet, Take 2,000 Units by mouth daily., Disp: , Rfl:    clopidogrel  (PLAVIX ) 75 MG tablet, Take 1 tablet (75 mg total) by mouth daily., Disp: 90 tablet, Rfl: 0   cyanocobalamin  (VITAMIN B12) 1000 MCG tablet, Take 1,000 mcg by mouth daily., Disp: , Rfl:    docusate sodium  (COLACE) 100 MG capsule, Take 100 mg by mouth 2 (two) times daily., Disp: , Rfl:    empagliflozin (JARDIANCE) 25 MG TABS tablet, Take 12.5 mg by mouth., Disp: , Rfl:    ezetimibe  (ZETIA ) 10 MG tablet, Take 1 tablet (10 mg total) by mouth daily., Disp: 90 tablet, Rfl: 3   FLAXSEED, LINSEED, PO, Take 2 g by mouth daily., Disp: , Rfl:    metoprolol  succinate (TOPROL -XL) 25 MG 24 hr tablet, Take 12.5 mg by mouth daily., Disp: , Rfl:    nitroGLYCERIN  (NITROSTAT ) 0.4 MG SL tablet, Place 1 tablet (0.4 mg total) under the tongue every 5 (five) minutes x 3 doses as needed for  chest pain., Disp: 25 tablet, Rfl: 0   pantoprazole  (PROTONIX ) 40 MG tablet, Take 1 tablet (40 mg total) by mouth 2 (two) times daily., Disp: 60 tablet, Rfl: 1   potassium chloride  (KLOR-CON ) 10 MEQ tablet, Take 2 tablets (20 mEq total) by mouth every other day. Take with Torsemide , Disp: 90 tablet, Rfl: 3   pregabalin  (LYRICA ) 150 MG capsule, Take 150 mg by mouth at bedtime., Disp: , Rfl:    Probiotic Product (UP4 PROBIOTICS PO), Take 7.5 mg by mouth., Disp: , Rfl:    Psyllium (GERI-MUCIL) 25 % POWD, Take by mouth., Disp: , Rfl:    ranolazine  (RANEXA ) 500 MG 12 hr tablet, Take 1 tablet (500 mg total) by mouth 2 (two) times daily., Disp: 180 tablet, Rfl: 3   tamsulosin  (FLOMAX ) 0.4 MG CAPS capsule, Take 2 capsules (0.8 mg total) by mouth daily., Disp: 30 capsule, Rfl: 0   torsemide  (DEMADEX ) 20 MG tablet, Alternate daily: 20 mg (1 tab) one day and 40 mg (2 tabs) the next day, Disp: 270 tablet, Rfl: 3   zinc  sulfate 220 (50 Zn) MG capsule, Take 1 tablet by mouth daily., Disp: , Rfl:   Past Medical History: Past Medical History:  Diagnosis Date   Acute embolic stroke (HCC) 02/01/2017   BPH (benign prostatic hyperplasia)    Bradycardia 11/10/2015   Coronary artery disease  Dizziness 08/21/2012   DYSPEPSIA 07/31/2010   Qualifier: Diagnosis of  By: Sebastian, RN, Rosina     Hyperlipidemia 05/16/2010   Qualifier: Diagnosis of  By: Perla MD, [name]      Hypertension    HYPERTENSION, BENIGN 05/16/2010   Qualifier: Diagnosis of  By: Perla MD, [name]     Stenosis of right carotid artery 02/01/2017    Tobacco Use: Social History   Tobacco Use  Smoking Status Never  Smokeless Tobacco Never    Labs: Review Flowsheet       Latest Ref Rng & Units 01/23/2010 06/15/2023 06/16/2023  Labs for ITP Cardiac and Pulmonary Rehab  Cholestrol 0 - 200 mg/dL 877  - 90   LDL (calc) 0 - 99 mg/dL 30.3  - 44   HDL-C >59 mg/dL 66.1  - 40   Trlycerides <150 mg/dL 93  - 30   Hemoglobin J8r 4.8 - 5.6 % 5.4  6.3  6.3  -     Details       Multiple values from one day are sorted in reverse-chronological order          Exercise Target Goals: Exercise Program Goal: Individual exercise prescription set using results from initial 6 min walk test and THRR while considering  patient's activity barriers and safety.   Exercise Prescription Goal: Initial exercise prescription builds to 30-45 minutes a day of aerobic activity, 2-3 days per week.  Home exercise guidelines will be given to patient during program as part of exercise prescription that the participant will acknowledge.   Education: Aerobic Exercise: - Group verbal and visual presentation on the components of exercise prescription. Introduces F.I.T.T principle from ACSM for exercise prescriptions.  Reviews F.I.T.T. principles of aerobic exercise including progression. Written material given at graduation. Flowsheet Row Cardiac Rehab from 12/18/2023 in [facility] Cardiac and Pulmonary Rehab  Education need identified 12/18/23    Education: Resistance Exercise: - Group verbal and visual presentation on the components of exercise prescription. Introduces F.I.T.T principle from ACSM for exercise prescriptions  Reviews F.I.T.T. principles of resistance exercise including progression. Written material given at graduation.    Education: Exercise & Equipment Safety: - Individual verbal instruction and demonstration of equipment use and safety with use of the equipment. Flowsheet Row Cardiac Rehab from 12/18/2023 in [facility] Cardiac and Pulmonary Rehab  Date 12/18/23  Educator MB  Instruction Review Code 1- Verbalizes Understanding    Education: Exercise Physiology & General Exercise Guidelines: - Group verbal and written instruction with models to review the exercise physiology of the cardiovascular system and associated critical values. Provides general exercise guidelines with specific guidelines to those with heart or lung disease.  Flowsheet Row Cardiac Rehab  from 12/18/2023 in [facility] Cardiac and Pulmonary Rehab  Education need identified 12/18/23    Education: Flexibility, Balance, Mind/Body Relaxation: - Group verbal and visual presentation with interactive activity on the components of exercise prescription. Introduces F.I.T.T principle from ACSM for exercise prescriptions. Reviews F.I.T.T. principles of flexibility and balance exercise training including progression. Also discusses the mind body connection.  Reviews various relaxation techniques to help reduce and manage stress (i.e. Deep breathing, progressive muscle relaxation, and visualization). Balance handout provided to take home. Written material given at graduation.   Activity Barriers & Risk Stratification:   6 Minute Walk:  6 Minute Walk     Row Name 04/22/24 1125         6 Minute Walk   Phase Discharge     Distance 750 feet  Distance % Change -6.8 %     Distance Feet Change -55 ft     Walk Time 6 minutes     # of Rest Breaks 0     MPH 1.4     METS 1     RPE 13     Perceived Dyspnea  1     VO2 Peak 3.19     Symptoms No     Resting HR 71 bpm     Resting BP 116/62     Resting Oxygen Saturation  97 %     Exercise Oxygen Saturation  during 6 min walk 95 %     Max Ex. HR 84 bpm     Max Ex. BP 132/72        Oxygen Initial Assessment:   Oxygen Re-Evaluation:   Oxygen Discharge (Final Oxygen Re-Evaluation):   Initial Exercise Prescription:   Perform Capillary Blood Glucose checks as needed.  Exercise Prescription Changes:   Exercise Prescription Changes     Row Name 01/23/24 1600 02/04/24 1500 02/17/24 1100 02/17/24 1400 03/05/24 1600     Response to Exercise   Blood Pressure (Admit) 122/64 120/64 -- 122/70 122/82   Blood Pressure (Exercise) 130/80 118/62 -- 140/80 124/68   Blood Pressure (Exit) 110/72 118/60 -- 122/68 114/66   Heart Rate (Admit) 69 bpm 75 bpm -- 77 bpm 67 bpm   Heart Rate (Exercise) 100 bpm 95 bpm -- 98 bpm 95 bpm   Heart Rate  (Exit) 67 bpm 78 bpm -- 74 bpm 74 bpm   Rating of Perceived Exertion (Exercise) 13 14 -- 13 13   Perceived Dyspnea (Exercise) 0 -- -- -- --   Symptoms none none -- none none   Duration Progress to 30 minutes of  aerobic without signs/symptoms of physical distress Continue with 30 min of aerobic exercise without signs/symptoms of physical distress. Continue with 30 min of aerobic exercise without signs/symptoms of physical distress. Continue with 30 min of aerobic exercise without signs/symptoms of physical distress. Continue with 30 min of aerobic exercise without signs/symptoms of physical distress.   Intensity THRR New THRR unchanged THRR unchanged THRR unchanged THRR unchanged     Progression   Progression Continue to progress workloads to maintain intensity without signs/symptoms of physical distress. Continue to progress workloads to maintain intensity without signs/symptoms of physical distress. Continue to progress workloads to maintain intensity without signs/symptoms of physical distress. Continue to progress workloads to maintain intensity without signs/symptoms of physical distress. Continue to progress workloads to maintain intensity without signs/symptoms of physical distress.   Average METs 1.96 1.8 1.8 1.86 2.34     Resistance Training   Training Prescription Yes Yes Yes Yes Yes   Weight 2lb 2lb 2lb 2lb 2lb   Reps 10-15 10-15 10-15 10-15 10-15     Interval Training   Interval Training No No No No No     NuStep   Level 3 2 2  -- 1   Minutes 15 15 15  -- 15   METs 1.8 1.36 1.36 -- 2.4     T5 Nustep   Level 2  T6 2  T6 2  T6 2  T6 --   Minutes 15 15 15 15  --   METs 1.6 2 2  1.9 --     Track   Laps 21 21 21 20  35   Minutes 15 15 15 15 30    METs 2.14 2.14 2.14 2.09 2.9     Home Exercise Plan  Plans to continue exercise at -- -- Home (comment)  walking Home (comment)  walking Home (comment)  walking   Frequency -- -- Add 2 additional days to program exercise sessions. Add 2  additional days to program exercise sessions. Add 2 additional days to program exercise sessions.   Initial Home Exercises Provided -- -- 02/17/24 02/17/24 02/17/24     Oxygen   Maintain Oxygen Saturation 88% or higher 88% or higher 88% or higher 88% or higher 88% or higher    Row Name 03/20/24 0700 04/02/24 1100 04/16/24 1500 04/28/24 1300 05/15/24 0700     Response to Exercise   Blood Pressure (Admit) 112/58 114/62 114/68 132/72 122/78   Blood Pressure (Exit) 126/60 142/64 126/72 122/68 124/68   Heart Rate (Admit) 66 bpm 76 bpm 76 bpm 70 bpm 70 bpm   Heart Rate (Exercise) 103 bpm 103 bpm 97 bpm 106 bpm 85 bpm   Heart Rate (Exit) 74 bpm 87 bpm 75 bpm 73 bpm 68 bpm   Rating of Perceived Exertion (Exercise) 15 15 13 13 15    Symptoms none none none none none   Duration Continue with 30 min of aerobic exercise without signs/symptoms of physical distress. Continue with 30 min of aerobic exercise without signs/symptoms of physical distress. Continue with 30 min of aerobic exercise without signs/symptoms of physical distress. Continue with 30 min of aerobic exercise without signs/symptoms of physical distress. Continue with 30 min of aerobic exercise without signs/symptoms of physical distress.   Intensity THRR unchanged THRR unchanged THRR unchanged THRR unchanged THRR unchanged     Progression   Progression Continue to progress workloads to maintain intensity without signs/symptoms of physical distress. Continue to progress workloads to maintain intensity without signs/symptoms of physical distress. Continue to progress workloads to maintain intensity without signs/symptoms of physical distress. Continue to progress workloads to maintain intensity without signs/symptoms of physical distress. Continue to progress workloads to maintain intensity without signs/symptoms of physical distress.   Average METs 2.21 2.09 2.14 2.21 2.28     Resistance Training   Training Prescription Yes Yes Yes Yes Yes    Weight 2 lb 2 lb 2 lb 2 lb 2 lb   Reps 10-15 10-15 10-15 10-15 10-15     Interval Training   Interval Training No No No No No     NuStep   Level 1  T6 nustep 1  T6 nustep 2  T6 nustep 2  T6 nustep 2  T6 nustep   Minutes 15 15 15 15 15    METs 3.1 2.5 1.9 2 2      Track   Laps 22 17 17 20 21    Minutes 15 15 15 15 15    METs 2.2 1.92 1.92 2.09 2.14     Home Exercise Plan   Plans to continue exercise at Home (comment)  walking Home (comment)  walking Home (comment)  walking Home (comment)  walking Home (comment)  walking   Frequency Add 2 additional days to program exercise sessions. Add 2 additional days to program exercise sessions. Add 2 additional days to program exercise sessions. Add 2 additional days to program exercise sessions. Add 2 additional days to program exercise sessions.   Initial Home Exercises Provided 02/17/24 02/17/24 02/17/24 02/17/24 02/17/24     Oxygen   Maintain Oxygen Saturation 88% or higher 88% or higher 88% or higher 88% or higher 88% or higher      Exercise Comments:   Exercise Goals and Review:   Exercise Goals  Re-Evaluation :  Exercise Goals Re-Evaluation     Row Name 01/23/24 1654 02/04/24 1526 02/17/24 1145 02/17/24 1455 03/05/24 1609     Exercise Goal Re-Evaluation   Exercise Goals Review Increase Physical Activity;Increase Strength and Stamina;Understanding of Exercise Prescription Increase Physical Activity;Increase Strength and Stamina;Understanding of Exercise Prescription Increase Physical Activity;Able to understand and use rate of perceived exertion (RPE) scale;Knowledge and understanding of Target Heart Rate Range (THRR);Understanding of Exercise Prescription;Increase Strength and Stamina;Able to check pulse independently Increase Physical Activity;Understanding of Exercise Prescription;Increase Strength and Stamina Increase Physical Activity;Understanding of Exercise Prescription;Increase Strength and Stamina   Comments Winford is doing well  in rehab. He was recently able to increase his level on the T4 nustep from 2 to 3. He was also able to maintain 21 laps on the track in 15 minutes. We will continue to monitor his progress in the program. Benjamim is doing well in rehab. He was able to maintain 21 laps on the track and level 2 on the T6 nustep. We will continue to monitor his progress in the program. Reviewed home exercise with pt today.  Pt plans to walk at home for exercise.  Reviewed THR, pulse, RPE, sign and symptoms, pulse oximetery and when to call 911 or MD.  Also discussed weather considerations and indoor options.  Pt voiced understanding. Karthik is doing well in rehab. He walked 20 laps on the track and was able to maintain level 2 on the T6. We will continue to monitor his progress in the program. Karem continues to do well in rehab. He was able to maintain a level 1 on the T6 nustep. He was also able to increase from 20 to 35 laps on the track. We will continue to monitor his progress in the program.   Expected Outcomes Short: Continue to increase level on the T6 nustep, and push for more laps on the track. Long: Continue exercise to improve strength and stamina. Short: Continue to push for more laps on the track. Long: Continue exercise to improve strength and stamina. Short: walk at home 1-2 days a week on off days of rehab, following recommendations provided by staff in home exercise review. Long: maintain independent exercise routine upon graduation from cardiac rehab. Short: Push for more laps on the track and try level 3 on the T6 nustep. Long: Continue exercise to improve strength and stamina. Short: Continue to increase track laps, return to level 2 on the T6 nustep. Long: Continue exercise to improve strength and stamina.    Row Name 03/20/24 0754 04/01/24 1125 04/02/24 1113 04/16/24 1535 04/28/24 1349     Exercise Goal Re-Evaluation   Exercise Goals Review Increase Physical Activity;Understanding of Exercise  Prescription;Increase Strength and Stamina Increase Physical Activity;Understanding of Exercise Prescription;Increase Strength and Stamina Increase Physical Activity;Understanding of Exercise Prescription;Increase Strength and Stamina Increase Physical Activity;Understanding of Exercise Prescription;Increase Strength and Stamina Increase Physical Activity;Understanding of Exercise Prescription;Increase Strength and Stamina   Comments Hussain continues to do well in rehab. He was able to maintain a level 1 on the T6 nustep. He was also able to increase from 20 to 22 laps on the track when walking for 15 minutes. We will continue to monitor his progress in the program. Anuj is doing well and continues to improve. He states that he continues to do exercise at home, which consists of walking outside. We will continue to monitor his progress in the program. Mable has only attended two sessions since the last review. He has continued to  work at level 1 on the T6 nustep. He also saw a decrease in his laps walked on the track from 22 to 17 laps. We will continue to monitor his progress in the program. Ventura continues to do well in rehab. He improved to  level 2 on the T6 nustep. He also continues to walk 17 laps on the track after previously walking 22 laps. We will continue to monitor his progress in the program. Mahmood continues to do well in rehab. He recently completed his post . He continues to work at level 2 on the T6 nustep. He also increased back up to 20 laps on the track after previously walking only 17 laps. We will continue to monitor his progress until he graduates from the program.   Expected Outcomes Short: Increase to level 2 on the T6 nustep. Long: Continue exercise to improve strength and stamina. Short: Continue to exercise at home. Long: Continue exercise to improve strength and stamina. Short: Attend rehab more consistently. Long: Continue exercise to improve strength and stamina. Short:  Increase laps on the track back up to 22 laps. Long: Continue exercise to improve strength and stamina. Short: Graduate. Long: Continue to exercise independently.    Row Name 05/04/24 1119 05/15/24 0748 05/28/24 1551 06/09/24 1027 06/23/24 1341     Exercise Goal Re-Evaluation   Exercise Goals Review Increase Physical Activity;Increase Strength and Stamina;Understanding of Exercise Prescription Increase Physical Activity;Increase Strength and Stamina;Understanding of Exercise Prescription Increase Physical Activity;Increase Strength and Stamina;Understanding of Exercise Prescription Increase Physical Activity;Increase Strength and Stamina;Understanding of Exercise Prescription Increase Physical Activity;Increase Strength and Stamina;Understanding of Exercise Prescription   Comments Cullin is walking at home most every day. He walks up and down his hallway as he can tolerate. Adrian continues to do well in rehab. He increased back up to 21 laps walked on the track. He also continues to work at level 2 on the T6 nustep and use 2 lb hand weights for exercise. We will continue to monitor his progress in the program. Abhishek has not attended rehab since the last review. He last attended the program on 05/06/2024. He only has 3 sessions left until he graduates from the program. Neeraj has not attended rehab since the last review. He last attended the program on 05/06/2024. He only has 3 sessions left until he graduates from the program. Rozell has not attended rehab since the last review. He last attended the program on 05/06/2024. We recently called to check on pt and his wife answered and stated that he was working on getting his medication sorted out and has a doctor's appt on 06/25/2024. She stated that she would update us  after the appt. We will continue to monitor his progress in the program.   Expected Outcomes Short: continue to make an effort to be active at home.Long: maintain exercise routine upon  graduation from program. Short: Continue to push for more laps on the track. Long: Continue exercise to improve strength and stamina. Short: Return to rehab and graduate from the program. Long: Continue exercise to improve strength and stamina. Short: Return to rehab and graduate from the program. Long: Continue exercise to improve strength and stamina. Short: Return to rehab when appropriate. Long: Graduate.      Discharge Exercise Prescription (Final Exercise Prescription Changes):  Exercise Prescription Changes - 05/15/24 0700       Response to Exercise   Blood Pressure (Admit) 122/78    Blood Pressure (Exit) 124/68    Heart Rate (Admit)  70 bpm    Heart Rate (Exercise) 85 bpm    Heart Rate (Exit) 68 bpm    Rating of Perceived Exertion (Exercise) 15    Symptoms none    Duration Continue with 30 min of aerobic exercise without signs/symptoms of physical distress.    Intensity THRR unchanged      Progression   Progression Continue to progress workloads to maintain intensity without signs/symptoms of physical distress.    Average METs 2.28      Resistance Training   Training Prescription Yes    Weight 2 lb    Reps 10-15      Interval Training   Interval Training No      NuStep   Level 2   T6 nustep   Minutes 15    METs 2      Track   Laps 21    Minutes 15    METs 2.14      Home Exercise Plan   Plans to continue exercise at Home (comment)   walking   Frequency Add 2 additional days to program exercise sessions.    Initial Home Exercises Provided 02/17/24      Oxygen   Maintain Oxygen Saturation 88% or higher          Nutrition:  Target Goals: Understanding of nutrition guidelines, daily intake of sodium 1500mg , cholesterol 200mg , calories 30% from fat and 7% or less from saturated fats, daily to have 5 or more servings of fruits and vegetables.  Education: All About Nutrition: -Group instruction provided by verbal, written material, interactive activities,  discussions, models, and posters to present general guidelines for heart healthy nutrition including fat, fiber, MyPlate, the role of sodium in heart healthy nutrition, utilization of the nutrition label, and utilization of this knowledge for meal planning. Follow up email sent as well. Written material given at graduation. Flowsheet Row Cardiac Rehab from 12/18/2023 in [facility] Cardiac and Pulmonary Rehab  Education need identified 12/18/23    Biometrics:   Post Biometrics - 04/22/24 1127        Post  Biometrics   Height 5' 8.4 (1.737 m)    Weight 174 lb 1.6 oz (79 kg)    Waist Circumference 41.5 inches    Hip Circumference 40 inches    Waist to Hip Ratio 1.04 %    BMI (Calculated) 26.17    Single Leg Stand 0 seconds          Nutrition Therapy Plan and Nutrition Goals:   Nutrition Assessments:  MEDIFICTS Score Key: >=70 Need to make dietary changes  40-70 Heart Healthy Diet <= 40 Therapeutic Level Cholesterol Diet  Flowsheet Row Cardiac Rehab from 12/18/2023 in [facility] Cardiac and Pulmonary Rehab  Picture Your Plate Total Score on Admission 67   Picture Your Plate Scores: <59 Unhealthy dietary pattern with much room for improvement. 41-50 Dietary pattern unlikely to meet recommendations for good health and room for improvement. 51-60 More healthful dietary pattern, with some room for improvement.  >60 Healthy dietary pattern, although there may be some specific behaviors that could be improved.    Nutrition Goals Re-Evaluation:  Nutrition Goals Re-Evaluation     Row Name 04/01/24 1116 05/04/24 1127           Goals   Nutrition Goal RD appointment deferred RD appointment deferred         Nutrition Goals Discharge (Final Nutrition Goals Re-Evaluation):  Nutrition Goals Re-Evaluation - 05/04/24 1127       Goals  Nutrition Goal RD appointment deferred          Psychosocial: Target Goals: Acknowledge presence or absence of significant depression and/or stress,  maximize coping skills, provide positive support system. Participant is able to verbalize types and ability to use techniques and skills needed for reducing stress and depression.   Education: Stress, Anxiety, and Depression - Group verbal and visual presentation to define topics covered.  Reviews how body is impacted by stress, anxiety, and depression.  Also discusses healthy ways to reduce stress and to treat/manage anxiety and depression.  Written material given at graduation. Flowsheet Row Cardiac Rehab from 07/15/2023 in [facility] Cardiac and Pulmonary Rehab  Education need identified 07/15/23    Education: Sleep Hygiene -Provides group verbal and written instruction about how sleep can affect your health.  Define sleep hygiene, discuss sleep cycles and impact of sleep habits. Review good sleep hygiene tips.    Initial Review & Psychosocial Screening:   Quality of Life Scores:   Scores of 19 and below usually indicate a poorer quality of life in these areas.  A difference of  2-3 points is a clinically meaningful difference.  A difference of 2-3 points in the total score of the Quality of Life Index has been associated with significant improvement in overall quality of life, self-image, physical symptoms, and general health in studies assessing change in quality of life.  PHQ-9: Review Flowsheet       05/04/2024 12/18/2023 07/15/2023  Depression screen PHQ 2/9  Decreased Interest 0 0 0  Down, Depressed, Hopeless 0 0 0  PHQ - 2 Score 0 0 0  Altered sleeping 0 0 0  Tired, decreased energy 0 1 2  Change in appetite 0 0 1  Feeling bad or failure about yourself  0 0 0  Trouble concentrating 0 2 3  Moving slowly or fidgety/restless 0 3 0  Suicidal thoughts 0 0 0  PHQ-9 Score 0 6 6  Difficult doing work/chores - Extremely dIfficult Somewhat difficult   Interpretation of Total Score  Total Score Depression Severity:  1-4 = Minimal depression, 5-9 = Mild depression, 10-14 = Moderate  depression, 15-19 = Moderately severe depression, 20-27 = Severe depression   Psychosocial Evaluation and Intervention:   Psychosocial Re-Evaluation:  Psychosocial Re-Evaluation     Row Name 04/01/24 1112 05/04/24 1128           Psychosocial Re-Evaluation   Current issues with None Identified None Identified      Comments Abner states that he does not have any stress at this time. He has a good support system made up of his two sons and his wife. He reports no sleep concerns at this time. PHQ depresison screening questionaire was re-done. Patient's score went from a 6 to a 0. He states he is doing ok mentally, with his only concern not being able to do all the things he used to be able to do.      Expected Outcomes Short: Continue to attend cardiac rehab for stress relief. Long: Continue to maintain positive outlook. Short: continue to attend cardiac rehab  for mental health benefits. Long: maintain good mental health routine.      Interventions Encouraged to attend Cardiac Rehabilitation for the exercise --      Continue Psychosocial Services  Follow up required by staff Follow up required by staff         Psychosocial Discharge (Final Psychosocial Re-Evaluation):  Psychosocial Re-Evaluation - 05/04/24 1128  Psychosocial Re-Evaluation   Current issues with None Identified    Comments PHQ depresison screening questionaire was re-done. Patient's score went from a 6 to a 0. He states he is doing ok mentally, with his only concern not being able to do all the things he used to be able to do.    Expected Outcomes Short: continue to attend cardiac rehab  for mental health benefits. Long: maintain good mental health routine.    Continue Psychosocial Services  Follow up required by staff          Vocational Rehabilitation: Provide vocational rehab assistance to qualifying candidates.   Vocational Rehab Evaluation & Intervention:   Education: Education Goals: Education  classes will be provided on a variety of topics geared toward better understanding of heart health and risk factor modification. Participant will state understanding/return demonstration of topics presented as noted by education test scores.  Learning Barriers/Preferences:   General Cardiac Education Topics:  AED/CPR: - Group verbal and written instruction with the use of models to demonstrate the basic use of the AED with the basic ABC's of resuscitation.   Anatomy and Cardiac Procedures: - Group verbal and visual presentation and models provide information about basic cardiac anatomy and function. Reviews the testing methods done to diagnose heart disease and the outcomes of the test results. Describes the treatment choices: Medical Management, Angioplasty, or Coronary Bypass Surgery for treating various heart conditions including Myocardial Infarction, Angina, Valve Disease, and Cardiac Arrhythmias.  Written material given at graduation. Flowsheet Row Cardiac Rehab from 12/18/2023 in [facility] Cardiac and Pulmonary Rehab  Education need identified 12/18/23    Medication Safety: - Group verbal and visual instruction to review commonly prescribed medications for heart and lung disease. Reviews the medication, class of the drug, and side effects. Includes the steps to properly store meds and maintain the prescription regimen.  Written material given at graduation.   Intimacy: - Group verbal instruction through game format to discuss how heart and lung disease can affect sexual intimacy. Written material given at graduation.. Flowsheet Row Cardiac Rehab from 12/18/2023 in [facility] Cardiac and Pulmonary Rehab  Education need identified 12/18/23    Know Your Numbers and Heart Failure: - Group verbal and visual instruction to discuss disease risk factors for cardiac and pulmonary disease and treatment options.  Reviews associated critical values for Overweight/Obesity, Hypertension, Cholesterol, and  Diabetes.  Discusses basics of heart failure: signs/symptoms and treatments.  Introduces Heart Failure Zone chart for action plan for heart failure.  Written material given at graduation.   Infection Prevention: - Provides verbal and written material to individual with discussion of infection control including proper hand washing and proper equipment cleaning during exercise session. Flowsheet Row Cardiac Rehab from 12/18/2023 in [facility] Cardiac and Pulmonary Rehab  Date 12/18/23  Educator MB  Instruction Review Code 1- Verbalizes Understanding    Falls Prevention: - Provides verbal and written material to individual with discussion of falls prevention and safety. Flowsheet Row Cardiac Rehab from 12/18/2023 in [facility] Cardiac and Pulmonary Rehab  Date 12/18/23  Educator MB  Instruction Review Code 1- Verbalizes Understanding    Other: -Provides group and verbal instruction on various topics (see comments)   Knowledge Questionnaire Score:   Core Components/Risk Factors/Patient Goals at Admission:   Education:Diabetes - Individual verbal and written instruction to review signs/symptoms of diabetes, desired ranges of glucose level fasting, after meals and with exercise. Acknowledge that pre and post exercise glucose checks will be done for 3 sessions at  entry of program.   Core Components/Risk Factors/Patient Goals Review:   Goals and Risk Factor Review     Row Name 04/01/24 1118 05/04/24 1130           Core Components/Risk Factors/Patient Goals Review   Personal Goals Review Weight Management/Obesity;Hypertension Hypertension      Review Robley states that he does not take his BP at home very frequently. His BP has been within a healthy range on program days, today he measured 118/68. Mansel is still maintaining his weight, he state that his wife manages his diet. Roch was asked if he was checking his BP at home again. He does not recall doing so. He was encouraged to start doing  so.      Expected Outcomes Short: Check BP at home more regularly. Long: Continue to manage risk factors. Short: Check BP at home more regularly. Long: Continue to manage risk factors.         Core Components/Risk Factors/Patient Goals at Discharge (Final Review):   Goals and Risk Factor Review - 05/04/24 1130       Core Components/Risk Factors/Patient Goals Review   Personal Goals Review Hypertension    Review Harbor was asked if he was checking his BP at home again. He does not recall doing so. He was encouraged to start doing so.    Expected Outcomes Short: Check BP at home more regularly. Long: Continue to manage risk factors.          ITP Comments:  ITP Comments     Row Name 01/15/24 0727 02/12/24 0757 03/11/24 0732 04/08/24 0827 05/06/24 0915   ITP Comments 30 Day review completed. Medical Director ITP review done, changes made as directed, and signed approval by Medical Director.    new to program 30 Day review completed. Medical Director ITP review done, changes made as directed, and signed approval by Medical Director. 30 Day review completed. Medical Director ITP review done, changes made as directed, and signed approval by Medical Director. 30 Day review completed. Medical Director ITP review done, changes made as directed, and signed approval by Medical Director. 30 Day review completed. Medical Director ITP review done, changes made as directed, and signed approval by Medical Director.    Row Name 06/03/24 0753 07/01/24 0744 07/06/24 1118       ITP Comments 30 Day review completed. Medical Director ITP review done, changes made as directed, and signed approval by Medical Director. 30 Day review completed. Medical Director ITP review done, changes made as directed, and signed approval by Medical Director. Patient's wife called to request Mr. Daubenspeck be discharged at this time due to ongoing fluid retention issues. He had completed his post and 33 or 36 sessions.         Comments: Discharge ITP

## 2024-07-09 ENCOUNTER — Encounter: Payer: Self-pay | Admitting: Cardiovascular Disease

## 2024-07-09 ENCOUNTER — Encounter: Payer: Self-pay | Admitting: Podiatry

## 2024-07-09 ENCOUNTER — Ambulatory Visit: Admitting: Podiatry

## 2024-07-09 ENCOUNTER — Ambulatory Visit (INDEPENDENT_AMBULATORY_CARE_PROVIDER_SITE_OTHER)

## 2024-07-09 ENCOUNTER — Other Ambulatory Visit: Payer: Self-pay | Admitting: Podiatry

## 2024-07-09 DIAGNOSIS — L84 Corns and callosities: Secondary | ICD-10-CM

## 2024-07-09 DIAGNOSIS — B351 Tinea unguium: Secondary | ICD-10-CM | POA: Diagnosis not present

## 2024-07-09 DIAGNOSIS — L97512 Non-pressure chronic ulcer of other part of right foot with fat layer exposed: Secondary | ICD-10-CM | POA: Diagnosis not present

## 2024-07-09 DIAGNOSIS — G629 Polyneuropathy, unspecified: Secondary | ICD-10-CM | POA: Diagnosis not present

## 2024-07-09 DIAGNOSIS — M79676 Pain in unspecified toe(s): Secondary | ICD-10-CM | POA: Diagnosis not present

## 2024-07-09 MED ORDER — DOXYCYCLINE HYCLATE 100 MG PO CAPS: 100.0000 mg | ORAL_CAPSULE | Freq: Two times a day (BID) | ORAL | 0 refills | Status: AC

## 2024-07-09 MED ORDER — GENTAMICIN SULFATE 0.1 % EX CREA: TOPICAL_CREAM | CUTANEOUS | 1 refills | Status: DC

## 2024-07-09 NOTE — Patient Instructions (Addendum)
   CONTACT DR. KEVIN PATEL WITH ANY QUESTIONS/CONCERNS. FOLLOW UP WITH DR. PATEL IN 2 WEEKS.  DRESSING CHANGES RIGHT GREAT TOE:   PHARMACY SHOPPING LIST: Saline or Wound Cleanser for cleaning wound 2 x 2 inch sterile gauze for cleaning wound 2 INCH FABRIC BAND-AIS GENTAMICIN  CREAM  IF PRESCRIBED ORAL ANTIBIOTICS, TAKE ALL MEDICATION AS PRESCRIBED UNTIL ALL ARE GONE.   KEEP RIGHT FOOT DRY AT ALL TIMES!!!!  CLEANSE ULCER WITH SALINE OR WOUND CLEANSER.  DAB DRY WITH GAUZE SPONGE.  APPLY A LIGHT AMOUNT OF GENTAMICIN  CREAM TO BASE OF ULCER.  APPLY 2 INCH FABRIC BAND-AID.  DO NOT WALK BAREFOOT!!!  IF YOU EXPERIENCE ANY FEVER, CHILLS, NIGHTSWEATS, NAUSEA OR VOMITING, ELEVATED OR LOW BLOOD SUGARS, REPORT TO EMERGENCY ROOM.  IF YOU EXPERIENCE INCREASED REDNESS, PAIN, SWELLING, DISCOLORATION, ODOR, PUS, DRAINAGE OR WARMTH OF YOUR FOOT, REPORT TO EMERGENCY ROOM.

## 2024-07-09 NOTE — Progress Notes (Signed)
 Subjective:  Patient ID: [name]., male    DOB: 09-10-1933,  MRN: [id number]  [name]. presents to clinic today for at risk foot care with history of peripheral neuropathy and preulcerative lesion(s) left foot and painful mycotic toenails that limit ambulation. Painful toenails interfere with ambulation. Aggravating factors include wearing enclosed shoe gear. Pain is relieved with periodic professional debridement. Painful preulcerative lesion(s) is/are aggravated when weightbearing with and without shoegear. Pain is relieved with periodic professional debridement. He is accompanied by his wife on today's visit.  Chief Complaint  Patient presents with   RFC    Rm1 Routine foot care/ Dr. Auston last visit July 2025    New problem(s): Right great toe. Patient accompanied by wife. He has h/o cardiovascular issues. S/p heart valve replacement. He had episode of bilateral lower extremity edema a few weeks ago and was unable to wear his regular shoes. He now presents with this lesion and was unaware of it and did not feel it due to neuropathy.  Wife denies any fever, chills, night sweats, nausea or vomiting.           PCP is Auston Reyes BIRCH, MD. ARNETTA 06/17/2024.  Allergies  Allergen Reactions   Penicillins Rash and Dermatitis    Rash   Gabapentin    Penicillin G    Codeine Nausea Only and Nausea And Vomiting    Other reaction(s): Vomiting Other reaction(s): Vomiting    Review of Systems: Negative except as noted in the HPI.  Objective: No changes noted in today's physical examination. There were no vitals filed for this visit. Calvin Jimmy [employer]. is a pleasant 89 y.o. male WD, WN in NAD. AAO x 3.  Vascular Examination: Capillary refill time immediate b/l. Vascular status intact b/l with palpable pedal pulses. Pedal hair present b/l. No pain with calf compression b/l. Skin temperature gradient WNL b/l. No cyanosis or clubbing b/l. No  ischemia or gangrene noted b/l.   Neurological Examination: Protective sensation diminished with 10g monofilament b/l.   Dermatological Examination:  Wound Location: plantarmedial aspect IPJ right great toe There is devitalized tissue present in the wound. Predebridement: hemorrhagic hyperkeratotic roof with underlying fluctuance. Postdebridement Wound Measurement: 1.0 x 1.5 x 0.2 cm. Wound Base: Granular/Healthy Peri-wound: Normal, Calloused Exudate: viscous serosanguinous drainage with no odor Blood Loss during debridement: 0 cc('s). Sign(s) of clinical bacterial infection: no clinical signs of infection noted on examination today.  Pedal skin with normal turgor, texture and tone b/l.  No interdigital macerations.   Toenails 1-5 b/l thick, discolored, elongated with subungual debris and pain on dorsal palpation.   Preulcerative lesion noted distal tip left third digit. There is visible subdermal hemorrhage. There is no surrounding erythema, no edema, no drainage, no odor, no fluctuance.  Musculoskeletal Examination: Muscle strength 5/5 to all lower extremity muscle groups bilaterally. Clawtoe deformity L 3rd toe.  Xray findings right great toe: No gas in tissues right foot. Contracted digits right foot. No bone erosion noted at location of ulceration right great toe. No evidence of fracture right foot.   Assessment/Plan: 1. Pain due to onychomycosis of toenail   2. Pre-ulcerative corn or callous   3. Peripheral polyneuropathy   4. Ulcer of toe, right, limited to breakdown of skin (HCC)    Plan: -Patient was evaluated and treated and all questions answered.  -Patient/POA/Family member educated on diagnosis and treatment plan of routine ulcer debridement/wound care.  -Ulceration debridement achieved utilizing sharp excisional debridement with sterile  scalpel blade and sterile currette.. Type/amount of devitalized tissue removed: nonviable hyperkeratosis -Today's ulcer size  post-debridement: 1.0 x 1.5 x 0.2 cm. -Ulceration cleansed with wound cleanser. Triple antibiotic ointment applied to base of ulceration and secured with light dressing. -Wound responded well to today's debridement. -Calvin Jimmy Brigham Jr.'s wife given written instructions on daily wound care for right great toe ulceration. -Prescription written for Gentamicin  Cream. Patient is to apply to right great toe once daily and cover with dressing.  -Rx for Doxycyline 100 mg, #20, to be taken one capsule twice daily for 10 days. -Wound culture and sensitivity ordered today right great toe. -Radiology ordered today: X-Ray: 3 views right foot. Discussed results with Mrs. Virgen. Mycotic toenails 1-5 debrided in length and girth without incident.  -Preulcerative lesion(s) distal tip of left 3rd toe pared with sharp debridement without incident.Continue soft, supportive shoe gear daily. Report any pedal injuries to medical professional. Call office if there are any questions/concerns -Return in about 2 weeks for follow up right great toe ulcer with Dr. Franky Blanch (around 07/23/2024).  Delon LITTIE Merlin, DPM      [place] LOCATION: [address], [place] [address]                   Office [phone]   [facility] LOCATION: [address] [place], [place] [address] Office [phone]

## 2024-07-10 ENCOUNTER — Other Ambulatory Visit: Payer: Self-pay | Admitting: Cardiovascular Disease

## 2024-07-10 ENCOUNTER — Encounter: Payer: Self-pay | Admitting: Podiatry

## 2024-07-15 ENCOUNTER — Ambulatory Visit: Payer: Self-pay | Admitting: Podiatry

## 2024-07-15 LAB — WOUND CULTURE: Organism ID, Bacteria: NONE SEEN

## 2024-07-16 ENCOUNTER — Encounter: Payer: Self-pay | Admitting: Cardiovascular Disease

## 2024-07-18 NOTE — Progress Notes (Unsigned)
 Cardiology Office Note  Date:  07/20/2024   ID:  Zachary Camp Armand Raddle., DOB 1933-03-01, MRN [id number]  PCP:  Auston Reyes BIRCH, MD   Chief Complaint  Patient presents with   2-3 week follow up     Doing well.     HPI:  Mr. Ayars is a 89 year-old gentleman with a history of  coronary artery disease,   stent placed to his proximal RCA in September 2007, taxis stent 3.0 x 24 mm, previous syncopal episode (Etiology of his spell is uncertain),  50% carotid disease on the right CVA 2018, History of peripheral vision issues  EF 35 to 40% aortic valve stenosis, s/p TAVR temp perm placement for CHB immediately following the procedure, but did not require permanent pacemaker  bleeding ulcer in 2024 Feb with 2 units of transfusion  who presents for routine followup of his coronary artery disease,  Tremor, and stroke, PAF, s/p TAVR 11/24  Last seen by myself in clinic 6/25 Weight down 10 pounds from 6/25,  180 pounds now down to 170 At home weight running 162 up to 166  Taking torsemide  20/40 alternating  Minimal leg edema, denies abdominal fullness, no shortness of breath on exertion  Remains in atrial fibrillation/flutter Asymptomatic Wife who presents with him on today's visit reports his energy is about at his baseline  Lab work reviewed Typical creatinine running 1.4, more recently 1.7 Taking potassium 20 mill equivalents daily, potassium level 3.5  EKG personally reviewed by myself on todays visit EKG Interpretation Date/Time:  Monday July 20 2024 11:25:26 EDT Ventricular Rate:  74 PR Interval:    QRS Duration:  160 QT Interval:  476 QTC Calculation: 528 R Axis:   -55  Text Interpretation: Atrial fibrillation /atrail flutter with premature ventricular or aberrantly conducted complexes Left axis deviation Non-specific intra-ventricular conduction block Minimal voltage criteria for LVH, may be normal variant ( Cornell product ) Lateral infarct , age undetermined  Inferior infarct , age undetermined When compared with ECG of 28-Jun-2024 14:33, No significant change was found Confirmed by Perla Lye [phone]) on 07/20/2024 11:37:10 AM   Two-week holter monitor November 2024 after TAVR discharge demonstrated predominantly sinus rhythm with HR range 41-127, 3 episodes NSVT, ventricular ectopy burden2%, 3% afib burden with no RVR.   ABI and LE arterial doppler normal August 2024  Additional cardiac records reviewed  hospital non-STEMI June 2024 troponin 1300 Cardiac catheterization June 15, 2023 Severe multivessel coronary disease with unclear culprit Moderately reduced ejection fraction Transfer to [facility] Not a candidate for CABG Left circumflex felt to be subtotal, very small in caliber, diffuse moderate disease of mid LAD and large diagonal with heavy calcification  RCA with critical ostial and mid vessel disease with stents between with moderate in-stent restenosis, entire RCA severely calcified   Echo June 16, 2023 EF 35 to 40% Moderate aortic valve stenosis mean gradient 16 mmHg, peak gradient 25, estimated aortic valve area 0.8, Low-flow, low gradient  Seen by [facility] cardiology July 12, 2023 Statin held, spironolactone 12.53 days a week, Jardiance 10, Imdur  held, Lasix  as needed  On further follow-up losartan  added but caused orthostasis symptoms  Echo at [facility] September 05, 2023 Aortic valve stenosis mean gradient 21 mmHg peak gradient 35 Moderate MR EF essentially unchanged 35%   TAVR end of November 2024  Admitted to the hospital with chest pain May 11, 2023 Troponin 1324 Cardiac catheterization performed June 15, 2023 Severe multivessel coronary disease with unclear culprit Ostial-proximal LCx subtotal 99  to 100% occlusion with <TIMI I flow Mild diffuse proximal LAD disease with 70% stenosis after short mid vessel diagonal branch, the large Ramus-like first diagonal with focal 50% stenosis Heavily calcified ostial RCA (very difficult to  engage) 95% stenosis followed by diffuse 40 to 60% ISR of the proximal to mid vessel stent followed by 99 % subtotal occlusion after the stent crossing major RV marginal branch. Brisk collaterals fill the PDA  Moderate reduced EF with basal to mid inferior essentially akinesis and inferolateral hypokinesis.  Transfer to [facility] Chest pain-free at [facility] Left circumflex felt to be subtotal, very small in caliber, diffuse moderate disease of mid LAD and large diagonal with heavy calcification RCA with critical ostial and mid vessel disease with stents between with moderate in-stent restenosis, entire RCA severely calcified CABG not felt to be a good candidate Eliquis , Plavix , isosorbide , Ranexa   Zio monitor from April 2024 Frequent episodes of tachycardia, frequent PACs 408 Supraventricular Tachycardia runs occurred, the run with the fastest interval lasting 5 beats with a max rate of 146 bpm, the longest lasting 25.4 secs with an avg rate of 110 bpm.  History of bradycardia  past medical history reviewed history of left parieto-occipital hemorrhagic stroke  seen by ophthamologist 3/22 who did HVF test which showed right inf>sup quandrantopsia. felt this was a new finding Mr. Kops denied any new visual field deficits.  He has been driving Seen in the Er 6/84/7977 Head MRI: nothing acute  01/16/2020,  ED for syncope 3 alcoholic beverages that night, which was more than his usual  woke around 2 AM, and after entering the kitchen, had a syncopal episode  --received his second COVID-19 vaccination 4 days prior. usually spends some time on the edge of the bed and seated before standing, in order to prevent dizziness  daughter reports the fall / LOC woke her from sleep She found him slumped over on the floor.  he was cold to the touch and unable to speak  blood pressure was low.  EMS took his BP when they arrived and noted it was low.  MRA was without acute abnormality and showed mild chronic  small vessel ischemic dz and moderate cerebral atrophy. chronic hemorrhagic L temporoparietal infarct.     fall while in [place] 2019 Was watching a football game, stood up and had a mechanical fall tripped over some shoes 10/24/2018 Suffered a hip fracture , was hospitalized  Following the surgery the physicians increased his lisinopril  up to 20 mg daily and placed him on aspirin  325 mg daily He has started to have some upset stomach, wonders if it could be the high-dose aspirin   February 2018 he had acute vision changes, word finding difficulty. He was taken by life flight to [facility] hospital MRI/MRA showing acute stroke Details of scan as below: 1. Acute left parieto-occipital intraparenchymal hematoma with adjacent subarachnoid hemorrhage. This most likely represents hemorrhagic transformation from an embolic infarct given its configuration. No underlying mass. No other findings to suggest amyloid angiopathy. 2. Normal MRA of the head.  He had a CT scan of the neck showing 50% stenosis on the left, 10% on the right  PMH:   has a past medical history of Acute embolic stroke (HCC) (02/01/2017), BPH (benign prostatic hyperplasia), Bradycardia (11/10/2015), Coronary artery disease, Dizziness (08/21/2012), DYSPEPSIA (07/31/2010), Hyperlipidemia (05/16/2010), Hypertension, HYPERTENSION, BENIGN (05/16/2010), and Stenosis of right carotid artery (02/01/2017).  PSH:    Past Surgical History:  Procedure Laterality Date   ESOPHAGOGASTRODUODENOSCOPY (EGD) WITH PROPOFOL  N/A  01/08/2023   Procedure: ESOPHAGOGASTRODUODENOSCOPY (EGD) WITH PROPOFOL ;  Surgeon: Jinny Carmine, MD;  Location: [facility] ENDOSCOPY;  Service: Endoscopy;  Laterality: N/A;   HIP SURGERY     right hip    Intestinal Blockage     LEFT HEART CATH AND CORONARY ANGIOGRAPHY N/A 06/15/2023   Procedure: LEFT HEART CATH AND CORONARY ANGIOGRAPHY;  Surgeon: Anner Alm ORN, MD;  Location: [facility] INVASIVE CV LAB;  Service: Cardiovascular;  Laterality: N/A;    LOOP RECORDER INSERTION N/A 05/14/2017   Procedure: Loop Recorder Insertion;  Surgeon: Fernande Elspeth BROCKS, MD;  Location: [facility] INVASIVE CV LAB;  Service: Cardiovascular;  Laterality: N/A;   SKIN CANCER EXCISION     x2   TONSILLECTOMY      Current Outpatient Medications  Medication Sig Dispense Refill   acetaminophen  (TYLENOL ) 650 MG CR tablet Take 650 mg by mouth every 8 (eight) hours as needed.      apixaban  (ELIQUIS ) 2.5 MG TABS tablet Take 1 tablet (2.5 mg total) by mouth 2 (two) times daily. 180 tablet 3   Ascorbic Acid (VITAMIN C ) 1000 MG tablet Take 1,000 mg by mouth daily.     atorvastatin  (LIPITOR) 20 MG tablet Take 20 mg by mouth at bedtime.     b complex vitamins tablet Take 1 tablet by mouth daily.     cholecalciferol  (VITAMIN D ) 1000 units tablet Take 2,000 Units by mouth daily.     clopidogrel  (PLAVIX ) 75 MG tablet Take 1 tablet (75 mg total) by mouth daily. 90 tablet 0   cyanocobalamin  (VITAMIN B12) 1000 MCG tablet Take 1,000 mcg by mouth daily.     docusate sodium  (COLACE) 100 MG capsule Take 100 mg by mouth 2 (two) times daily.     empagliflozin (JARDIANCE) 25 MG TABS tablet Take 12.5 mg by mouth.     ezetimibe  (ZETIA ) 10 MG tablet Take 1 tablet (10 mg total) by mouth daily. 90 tablet 3   FLAXSEED, LINSEED, PO Take 2 g by mouth daily.     gentamicin  cream (GARAMYCIN ) 0.1 % Apply to affected toe once daily. 30 g 1   metoprolol  succinate (TOPROL -XL) 25 MG 24 hr tablet Take 12.5 mg by mouth daily.     nitroGLYCERIN  (NITROSTAT ) 0.4 MG SL tablet Place 1 tablet (0.4 mg total) under the tongue every 5 (five) minutes x 3 doses as needed for chest pain. 25 tablet 0   pantoprazole  (PROTONIX ) 40 MG tablet Take 1 tablet (40 mg total) by mouth 2 (two) times daily. 60 tablet 1   potassium chloride  (KLOR-CON  M) 10 MEQ tablet Take 10 mEq by mouth daily.     potassium chloride  (KLOR-CON ) 10 MEQ tablet Take 10 mEq by mouth 2 (two) times daily.     pregabalin  (LYRICA ) 150 MG capsule Take 150 mg  by mouth at bedtime.     Probiotic Product (UP4 PROBIOTICS PO) Take 7.5 mg by mouth.     Psyllium (GERI-MUCIL) 25 % POWD Take by mouth.     ranolazine  (RANEXA ) 500 MG 12 hr tablet Take 1 tablet (500 mg total) by mouth 2 (two) times daily. 180 tablet 3   tamsulosin  (FLOMAX ) 0.4 MG CAPS capsule Take 2 capsules (0.8 mg total) by mouth daily. 30 capsule 0   torsemide  (DEMADEX ) 20 MG tablet Alternate daily: 20 mg (1 tab) one day and 40 mg (2 tabs) the next day 270 tablet 3   zinc  sulfate 220 (50 Zn) MG capsule Take 1 tablet by mouth daily.  doxycycline  (VIBRAMYCIN ) 100 MG capsule Take 1 capsule (100 mg total) by mouth 2 (two) times daily for 10 days. (Patient not taking: Reported on 07/20/2024) 20 capsule 0   No current facility-administered medications for this visit.    Allergies:   Penicillins, Gabapentin, Penicillin g, and Codeine   Social History:  The patient  reports that he has never smoked. He has never used smokeless tobacco. He reports that he does not currently use alcohol. He reports that he does not use drugs.   Family History:   family history includes Cancer in an other family member; Hyperlipidemia in an other family member; Hypertension in his mother and another family member; Other in an other family member.    Review of Systems: Review of Systems  Constitutional: Negative.   HENT: Negative.    Respiratory: Negative.    Cardiovascular: Negative.   Gastrointestinal: Negative.   Musculoskeletal:  Positive for joint pain.  Neurological:  Positive for tremors.  Psychiatric/Behavioral:  Positive for memory loss.   All other systems reviewed and are negative.   PHYSICAL EXAM: VS:  BP 110/62 (BP Location: Left Arm, Patient Position: Sitting, Cuff Size: Normal)   Pulse 74   Ht 5' 8 (1.727 m)   Wt 170 lb 6 oz (77.3 kg)   SpO2 98%   BMI 25.91 kg/m  , BMI Body mass index is 25.91 kg/m. Constitutional:  oriented to person, place, and time. No distress.  HENT:  Head:  Grossly normal Eyes:  no discharge. No scleral icterus.  Neck: No JVD, no carotid bruits  Cardiovascular: Irregularly irregular, no murmurs appreciated Pulmonary/Chest: Clear to auscultation bilaterally, no wheezes or rales Abdominal: Soft.  no distension.  no tenderness.  Musculoskeletal: Normal range of motion Neurological:  normal muscle tone. Coordination normal. No atrophy Skin: Skin warm and dry Psychiatric: normal affect, pleasant  Recent Labs: 06/28/2024: ALT 16; BUN 27; Creatinine, Ser 1.64; Hemoglobin 12.7; Platelets 123; Potassium 3.2; Sodium 138    Lipid Panel Lab Results  Component Value Date   CHOL 90 06/16/2023   HDL 40 (L) 06/16/2023   LDLCALC 44 06/16/2023   TRIG 30 06/16/2023    Wt Readings from Last 3 Encounters:  07/20/24 170 lb 6 oz (77.3 kg)  07/01/24 171 lb 3.2 oz (77.7 kg)  06/09/24 174 lb 9.6 oz (79.2 kg)     ASSESSMENT AND PLAN:  Atrial fibrillation/atrial flutter EKG showing atrial flutter with ventricular rate in the 70s  Likely contributing to CHF symptoms Continue Eliquis  2.5 twice daily, given age over 35 creatinine over 1.5 - Long discussion with patient and patient's wife concerning various treatment options for his rhythm, they prefer rate control in an effort to avoid general anesthesia and cardioversion  Leg swelling Weight down 10 pounds from prior clinic visit on torsemide  40 alternating with 20 - Slight increase in creatinine from 1.4 baseline up to 1.7 - Weight stable 162 up to 166 - Will increase potassium from 20 up to 30 mill equivalents daily  Aortic valve stenosis Severe stenosis low gradient, status post TAVR  Hyperlipidemia, unspecified hyperlipidemia type - Continue Lipitor 20 mg daily  HYPERTENSION, BENIGN -  Blood pressure is well controlled on today's visit. No changes made to the medications.  Atherosclerosis of native coronary artery of native heart with chronic stable angina pectoris - Severe three-vessel  coronary disease not amenable to intervention Was seen by interventional at [facility] Tolerating Ranexa , metoprolol   Imdur  held by team at [facility] Denies chest  pain concerning for angina  syncope, unspecified syncope type - Plan: EKG 12-Lead Remote syncope  Acute embolic stroke (HCC) Continue Eliquis  2.5 twice daily, Plavix  75 daily  Stenosis of right carotid artery Nonobstructive disease,  Essential tremor Followed by neurology  Cardiomyopathy Ejection fraction 30 to 35%, slight decrease in the setting of atrial fibrillation or Continue metoprolol  succinate, Jardiance Losartan  appears to be on hold for now in the setting of low blood pressure, worsening renal function Torsemide  as above  Orders Placed This Encounter  Procedures   EKG 12-Lead     Signed, Velinda Lunger, M.D., Ph.D. 07/20/2024  [facility] [address], [place] [phone]

## 2024-07-20 ENCOUNTER — Ambulatory Visit: Attending: Cardiovascular Disease | Admitting: Cardiovascular Disease

## 2024-07-20 DIAGNOSIS — I471 Supraventricular tachycardia, unspecified: Secondary | ICD-10-CM | POA: Diagnosis present

## 2024-07-20 DIAGNOSIS — I350 Nonrheumatic aortic (valve) stenosis: Secondary | ICD-10-CM | POA: Diagnosis present

## 2024-07-20 DIAGNOSIS — I495 Sick sinus syndrome: Secondary | ICD-10-CM | POA: Insufficient documentation

## 2024-07-20 DIAGNOSIS — Z9861 Coronary angioplasty status: Secondary | ICD-10-CM | POA: Diagnosis present

## 2024-07-20 DIAGNOSIS — I2510 Atherosclerotic heart disease of native coronary artery without angina pectoris: Secondary | ICD-10-CM | POA: Diagnosis not present

## 2024-07-20 DIAGNOSIS — I10 Essential (primary) hypertension: Secondary | ICD-10-CM | POA: Diagnosis present

## 2024-07-20 DIAGNOSIS — I6521 Occlusion and stenosis of right carotid artery: Secondary | ICD-10-CM | POA: Diagnosis present

## 2024-07-20 DIAGNOSIS — I4892 Unspecified atrial flutter: Secondary | ICD-10-CM | POA: Insufficient documentation

## 2024-07-20 DIAGNOSIS — E785 Hyperlipidemia, unspecified: Secondary | ICD-10-CM | POA: Insufficient documentation

## 2024-07-20 DIAGNOSIS — N179 Acute kidney failure, unspecified: Secondary | ICD-10-CM | POA: Diagnosis present

## 2024-07-20 DIAGNOSIS — Z952 Presence of prosthetic heart valve: Secondary | ICD-10-CM | POA: Insufficient documentation

## 2024-07-20 DIAGNOSIS — I4819 Other persistent atrial fibrillation: Secondary | ICD-10-CM | POA: Diagnosis not present

## 2024-07-20 DIAGNOSIS — I5023 Acute on chronic systolic (congestive) heart failure: Secondary | ICD-10-CM | POA: Diagnosis not present

## 2024-07-20 DIAGNOSIS — N183 Chronic kidney disease, stage 3 unspecified: Secondary | ICD-10-CM | POA: Diagnosis present

## 2024-07-20 MED ORDER — POTASSIUM CHLORIDE ER 10 MEQ PO TBCR: 30.0000 meq | EXTENDED_RELEASE_TABLET | Freq: Every day | ORAL | 3 refills | Status: DC

## 2024-07-20 NOTE — Patient Instructions (Addendum)
 Goal weight 162 to 166 If weight runs <162, hold the torsemide   If weight runs >167, increase torsemide  to 40 mg daily   Medication Instructions:  Please increase the potassium up to 30 meq daily  If you need a refill on your cardiac medications before your next appointment, please call your pharmacy.   Lab work: No new labs needed  Testing/Procedures: No new testing needed  Follow-Up: At [facility], you and your health needs are our priority.  As part of our continuing mission to provide you with exceptional heart care, we have created designated Provider Care Teams.  These Care Teams include your primary Cardiologist (physician) and Advanced Practice Providers (APPs -  Physician Assistants and Nurse Practitioners) who all work together to provide you with the care you need, when you need it.  You will need a follow up appointment in 3 months  Providers on your designated Care Team:   Calvin Meager, NP Calvin Bring, PA-C [name], [place]  COVID-19 Vaccine Information can be found at: [email or web address] For questions related to vaccine distribution or appointments, please email vaccine@ [email or web address] or call [phone].

## 2024-07-23 ENCOUNTER — Encounter: Payer: Self-pay | Admitting: Podiatry

## 2024-07-23 ENCOUNTER — Ambulatory Visit (INDEPENDENT_AMBULATORY_CARE_PROVIDER_SITE_OTHER): Admitting: Podiatry

## 2024-07-23 VITALS — Ht 68.0 in | Wt 170.4 lb

## 2024-07-23 DIAGNOSIS — L97512 Non-pressure chronic ulcer of other part of right foot with fat layer exposed: Secondary | ICD-10-CM

## 2024-07-23 MED ORDER — SILVER SULFADIAZINE 1 % EX CREA: 1.0000 | TOPICAL_CREAM | Freq: Every day | CUTANEOUS | 0 refills | Status: DC

## 2024-07-23 NOTE — Progress Notes (Signed)
 Subjective:  Patient ID: Calvin Jimmy Armand Mickey., male    DOB: [birth year],  MRN: [id number]  Chief Complaint  Patient presents with   Foot Ulcer    Pt is here to f/u on right great toe due to ulcer.    89 y.o. male presents with the above complaint.  Patient presents with complaint of right great toe superficial ulceration with fat layer exposed.  He is here to get it evaluated denies any other acute complaints he has tried triple antibiotic ointment.  None of which has helped.  Wanted get it evaluated denies any other acute issues pain scale is 5 out of 10 dull aching nature.   Review of Systems: Negative except as noted in the HPI. Denies N/V/F/Ch.  Past Medical History:  Diagnosis Date   Acute embolic stroke (HCC) 02/01/2017   BPH (benign prostatic hyperplasia)    Bradycardia 11/10/2015   Coronary artery disease    Dizziness 08/21/2012   DYSPEPSIA 07/31/2010   Qualifier: Diagnosis of  By: Sebastian RN, Rosina     Hyperlipidemia 05/16/2010   Qualifier: Diagnosis of  By: Perla MD, [name]      Hypertension    HYPERTENSION, BENIGN 05/16/2010   Qualifier: Diagnosis of  By: Perla MD, [name]     Stenosis of right carotid artery 02/01/2017    Current Outpatient Medications:    acetaminophen  (TYLENOL ) 650 MG CR tablet, Take 650 mg by mouth every 8 (eight) hours as needed. , Disp: , Rfl:    apixaban  (ELIQUIS ) 2.5 MG TABS tablet, Take 1 tablet (2.5 mg total) by mouth 2 (two) times daily., Disp: 180 tablet, Rfl: 3   Ascorbic Acid (VITAMIN C ) 1000 MG tablet, Take 1,000 mg by mouth daily., Disp: , Rfl:    atorvastatin  (LIPITOR) 20 MG tablet, Take 20 mg by mouth at bedtime., Disp: , Rfl:    b complex vitamins tablet, Take 1 tablet by mouth daily., Disp: , Rfl:    cholecalciferol  (VITAMIN D ) 1000 units tablet, Take 2,000 Units by mouth daily., Disp: , Rfl:    clopidogrel  (PLAVIX ) 75 MG tablet, Take 1 tablet (75 mg total) by mouth daily., Disp: 90 tablet, Rfl: 0   cyanocobalamin  (VITAMIN B12) 1000  MCG tablet, Take 1,000 mcg by mouth daily., Disp: , Rfl:    docusate sodium  (COLACE) 100 MG capsule, Take 100 mg by mouth 2 (two) times daily., Disp: , Rfl:    empagliflozin (JARDIANCE) 25 MG TABS tablet, Take 12.5 mg by mouth., Disp: , Rfl:    ezetimibe  (ZETIA ) 10 MG tablet, Take 1 tablet (10 mg total) by mouth daily., Disp: 90 tablet, Rfl: 3   FLAXSEED, LINSEED, PO, Take 2 g by mouth daily., Disp: , Rfl:    gentamicin  cream (GARAMYCIN ) 0.1 %, Apply to affected toe once daily., Disp: 30 g, Rfl: 1   metoprolol  succinate (TOPROL -XL) 25 MG 24 hr tablet, Take 12.5 mg by mouth daily., Disp: , Rfl:    nitroGLYCERIN  (NITROSTAT ) 0.4 MG SL tablet, Place 1 tablet (0.4 mg total) under the tongue every 5 (five) minutes x 3 doses as needed for chest pain., Disp: 25 tablet, Rfl: 0   pantoprazole  (PROTONIX ) 40 MG tablet, Take 1 tablet (40 mg total) by mouth 2 (two) times daily., Disp: 60 tablet, Rfl: 1   potassium chloride  (KLOR-CON  M) 10 MEQ tablet, Take 10 mEq by mouth daily., Disp: , Rfl:    potassium chloride  (KLOR-CON ) 10 MEQ tablet, Take 3 tablets (30 mEq total) by mouth daily., Disp: 270  tablet, Rfl: 3   pregabalin  (LYRICA ) 150 MG capsule, Take 150 mg by mouth at bedtime., Disp: , Rfl:    Probiotic Product (UP4 PROBIOTICS PO), Take 7.5 mg by mouth., Disp: , Rfl:    Psyllium (GERI-MUCIL) 25 % POWD, Take by mouth., Disp: , Rfl:    ranolazine  (RANEXA ) 500 MG 12 hr tablet, Take 1 tablet (500 mg total) by mouth 2 (two) times daily., Disp: 180 tablet, Rfl: 3   silver  sulfADIAZINE  (SILVADENE ) 1 % cream, Apply 1 Application topically daily., Disp: 125 each, Rfl: 0   tamsulosin  (FLOMAX ) 0.4 MG CAPS capsule, Take 2 capsules (0.8 mg total) by mouth daily., Disp: 30 capsule, Rfl: 0   torsemide  (DEMADEX ) 20 MG tablet, Alternate daily: 20 mg (1 tab) one day and 40 mg (2 tabs) the next day, Disp: 270 tablet, Rfl: 3   zinc  sulfate 220 (50 Zn) MG capsule, Take 1 tablet by mouth daily., Disp: , Rfl:   Social History    Tobacco Use  Smoking Status Never  Smokeless Tobacco Never    Allergies  Allergen Reactions   Penicillins Rash and Dermatitis    Rash   Gabapentin    Penicillin G    Codeine Nausea Only and Nausea And Vomiting    Other reaction(s): Vomiting Other reaction(s): Vomiting   Objective:  There were no vitals filed for this visit. Body mass index is 25.91 kg/m. Constitutional Well developed. Well nourished.  Vascular Dorsalis pedis pulses palpable bilaterally. Posterior tibial pulses palpable bilaterally. Capillary refill normal to all digits.  No cyanosis or clubbing noted. Pedal hair growth normal.  Neurologic Normal speech. Oriented to person, place, and time. Epicritic sensation to light touch grossly present bilaterally.  Dermatologic Right hallux superficial ulceration with fat layer exposed.  Does not probe down to deep tissue.  No purulent drainage noted no malodor present no erythema noted  Orthopedic: Normal joint ROM without pain or crepitus bilaterally. No visible deformities. No bony tenderness.   Radiographs: None Assessment:   1. Ulcer of great toe, right, with fat layer exposed (HCC)    Plan:  Patient was evaluated and treated and all questions answered.  Right hallux ulcer fat layer exposed - All questions or concerns were discussed with the patient in extensive detail - Given the amount of ulceration is present he will benefit from Silvadene  creams patient was sent Silvadene  cream to the pharmacy - Offloading pads were dispensed  No follow-ups on file.

## 2024-07-24 ENCOUNTER — Other Ambulatory Visit: Payer: Self-pay | Admitting: Cardiovascular Disease

## 2024-07-30 ENCOUNTER — Encounter: Payer: Self-pay | Admitting: Cardiovascular Disease

## 2024-08-06 NOTE — Telephone Encounter (Signed)
 Called and spoke with wife per DPR. Notified her of the following from Dr. Gollan.  Blood pressure running a little bit low Would talk with Dr. Auston and see if we can decrease the Flomax  down to 1 pill from 2 pills a day Trying to prevent a dizzy, passout spell when standing Thx TGollan  Wife verbalizes understanding.

## 2024-08-20 ENCOUNTER — Ambulatory Visit: Admitting: Podiatry

## 2024-08-25 ENCOUNTER — Ambulatory Visit (INDEPENDENT_AMBULATORY_CARE_PROVIDER_SITE_OTHER): Admitting: Podiatry

## 2024-08-25 ENCOUNTER — Ambulatory Visit: Admitting: Podiatry

## 2024-08-25 DIAGNOSIS — L97512 Non-pressure chronic ulcer of other part of right foot with fat layer exposed: Secondary | ICD-10-CM | POA: Diagnosis not present

## 2024-08-25 NOTE — Progress Notes (Unsigned)
 Subjective:  Patient ID: [name]., male    DOB: 14-Sep-1933,  MRN: [id number]  Chief Complaint  Patient presents with   Routine Post Op   Toe Pain     3 wk f/u right foot. No pain or discharge.     89 y.o. male presents with the above complaint.  Patient presents with complaint of right great toe superficial ulceration with fat layer exposed.  He is here to get it evaluated denies any other acute complaints he has tried triple antibiotic ointment.  None of which has helped.  Wanted get it evaluated denies any other acute issues pain scale is 5 out of 10 dull aching nature.   Review of Systems: Negative except as noted in the HPI. Denies N/V/F/Ch.  Past Medical History:  Diagnosis Date   Acute embolic stroke (HCC) 02/01/2017   BPH (benign prostatic hyperplasia)    Bradycardia 11/10/2015   Coronary artery disease    Dizziness 08/21/2012   DYSPEPSIA 07/31/2010   Qualifier: Diagnosis of  By: Sebastian RN, Rosina     Hyperlipidemia 05/16/2010   Qualifier: Diagnosis of  By: Perla MD, [name]      Hypertension    HYPERTENSION, BENIGN 05/16/2010   Qualifier: Diagnosis of  By: Perla MD, [name]     Stenosis of right carotid artery 02/01/2017    Current Outpatient Medications:    acetaminophen  (TYLENOL ) 650 MG CR tablet, Take 650 mg by mouth every 8 (eight) hours as needed. , Disp: , Rfl:    apixaban  (ELIQUIS ) 2.5 MG TABS tablet, Take 1 tablet (2.5 mg total) by mouth 2 (two) times daily., Disp: 180 tablet, Rfl: 3   Ascorbic Acid (VITAMIN C ) 1000 MG tablet, Take 1,000 mg by mouth daily., Disp: , Rfl:    atorvastatin  (LIPITOR) 20 MG tablet, Take 20 mg by mouth at bedtime., Disp: , Rfl:    b complex vitamins tablet, Take 1 tablet by mouth daily., Disp: , Rfl:    cholecalciferol  (VITAMIN D ) 1000 units tablet, Take 2,000 Units by mouth daily., Disp: , Rfl:    clopidogrel  (PLAVIX ) 75 MG tablet, Take 1 tablet (75 mg total) by mouth daily., Disp: 90 tablet, Rfl: 0   cyanocobalamin  (VITAMIN  B12) 1000 MCG tablet, Take 1,000 mcg by mouth daily., Disp: , Rfl:    docusate sodium  (COLACE) 100 MG capsule, Take 100 mg by mouth 2 (two) times daily., Disp: , Rfl:    empagliflozin (JARDIANCE) 25 MG TABS tablet, Take 12.5 mg by mouth., Disp: , Rfl:    ezetimibe  (ZETIA ) 10 MG tablet, Take 1 tablet (10 mg total) by mouth daily., Disp: 90 tablet, Rfl: 3   FLAXSEED, LINSEED, PO, Take 2 g by mouth daily., Disp: , Rfl:    gentamicin  cream (GARAMYCIN ) 0.1 %, Apply to affected toe once daily., Disp: 30 g, Rfl: 1   metoprolol  succinate (TOPROL -XL) 25 MG 24 hr tablet, Take 12.5 mg by mouth daily., Disp: , Rfl:    nitroGLYCERIN  (NITROSTAT ) 0.4 MG SL tablet, Place 1 tablet (0.4 mg total) under the tongue every 5 (five) minutes x 3 doses as needed for chest pain., Disp: 25 tablet, Rfl: 0   pantoprazole  (PROTONIX ) 40 MG tablet, Take 1 tablet (40 mg total) by mouth 2 (two) times daily., Disp: 60 tablet, Rfl: 1   potassium chloride  (KLOR-CON  M) 10 MEQ tablet, Take 10 mEq by mouth daily., Disp: , Rfl:    potassium chloride  (KLOR-CON ) 10 MEQ tablet, Take 3 tablets (30 mEq total) by  mouth daily., Disp: 270 tablet, Rfl: 3   pregabalin  (LYRICA ) 150 MG capsule, Take 150 mg by mouth at bedtime., Disp: , Rfl:    Probiotic Product (UP4 PROBIOTICS PO), Take 7.5 mg by mouth., Disp: , Rfl:    Psyllium (GERI-MUCIL) 25 % POWD, Take by mouth., Disp: , Rfl:    ranolazine  (RANEXA ) 500 MG 12 hr tablet, Take 1 tablet (500 mg total) by mouth 2 (two) times daily., Disp: 180 tablet, Rfl: 3   silver  sulfADIAZINE  (SILVADENE ) 1 % cream, Apply 1 Application topically daily., Disp: 125 each, Rfl: 0   tamsulosin  (FLOMAX ) 0.4 MG CAPS capsule, Take 2 capsules (0.8 mg total) by mouth daily., Disp: 30 capsule, Rfl: 0   torsemide  (DEMADEX ) 20 MG tablet, Alternate daily: 20 mg (1 tab) one day and 40 mg (2 tabs) the next day, Disp: 270 tablet, Rfl: 3   zinc  sulfate 220 (50 Zn) MG capsule, Take 1 tablet by mouth daily., Disp: , Rfl:   Social  History   Tobacco Use  Smoking Status Never  Smokeless Tobacco Never    Allergies  Allergen Reactions   Penicillins Rash and Dermatitis    Rash   Gabapentin    Penicillin G    Codeine Nausea Only and Nausea And Vomiting    Other reaction(s): Vomiting Other reaction(s): Vomiting   Objective:  There were no vitals filed for this visit. There is no height or weight on file to calculate BMI. Constitutional Well developed. Well nourished.  Vascular Dorsalis pedis pulses palpable bilaterally. Posterior tibial pulses palpable bilaterally. Capillary refill normal to all digits.  No cyanosis or clubbing noted. Pedal hair growth normal.  Neurologic Normal speech. Oriented to person, place, and time. Epicritic sensation to light touch grossly present bilaterally.  Dermatologic Right hallux ulceration completely epithelialized.  No signs of dehiscence no complication noted.  No recurrence noted.  Orthopedic: Normal joint ROM without pain or crepitus bilaterally. No visible deformities. No bony tenderness.   Radiographs: None Assessment:   1. Ulcer of great toe, right, with fat layer exposed (HCC)     Plan:  Patient was evaluated and treated and all questions answered.  Right hallux ulcer fat layer exposed - All questions or concerns were discussed with the patient in extensive detail - Clinically healed and officially discharged from my care if any foot and ankle issues are future he will come back and see me.  No further signs of ulceration noted. - Shoe gear modification discussed No follow-ups on file.

## 2024-08-28 ENCOUNTER — Telehealth: Payer: Self-pay | Admitting: Cardiovascular Disease

## 2024-08-28 NOTE — Telephone Encounter (Signed)
 Pt c/o swelling/edema: STAT if pt has developed SOB within 24 hours  If swelling, where is the swelling located?   Stomach and legs  How much weight have you gained and in what time span?   Yes  Have you gained 2 pounds in a day or 5 pounds in a week?  2 pounds since yesterday  Do you have a log of your daily weights (if so, list)?   174.8 pounds,  BP 114/58  HR 78 (today) 173 pounds (yesterday)  Are you currently taking a fluid pill?   Yes  Are you currently SOB?  SOB  Have you traveled recently in a car or plane for an extended period of time?   nO  Wife Preston) stated patient's stomach and legs are as hard as a brick.  Wife wants advice on next steps.

## 2024-08-30 NOTE — Progress Notes (Unsigned)
 Cardiology Office Note  Date:  08/31/2024   ID:  [name]., DOB September 19, 1933, MRN [id number]  PCP:  Auston Reyes BIRCH, MD   Chief Complaint  Patient presents with   Edema in abdomen and legs/shortness of breath    Patient c/o shortness of breath, abdominal swelling as well as lower extremities; symptoms x 1 week.  Patient take Torsemide  20 mg daily but has taken 40 mg recently on Sept. 26,27 & 29 th.     HPI:  [name] is a 89 year-old gentleman with a history of  coronary artery disease,   stent placed to his proximal RCA in September 2007, taxis stent 3.0 x 24 mm, previous syncopal episode (Etiology of his spell is uncertain),  50% carotid disease on the right CVA 2018, History of peripheral vision issues  EF 35 to 40% aortic valve stenosis, s/p TAVR temp perm placement for CHB immediately following the procedure, but did not require permanent pacemaker  bleeding ulcer in 2024 Feb with 2 units of transfusion  who presents for routine followup of his coronary artery disease,  Tremor, and stroke, PAF, s/p TAVR 11/24  Last seen by myself in clinic 8/25 In follow-up today he presents with his wife, Weight trending upwards, worsening ABD swelling, leg swelling Has been taking torsemide  20 daily with weight gain Past several days, changed from torsemide  20 to 40  2 early to tell if there is weight improvement 171.2 up to 173 at home Up 7 pounds here, 170 (aug 2025) to 177 today Goal weight 168  Lab work reviewed CR 1.9  SOB on ambulation,  Walking into [employer] Supine gets heavy SOB, uses 3 pills  Legs feel heavy, feel tight  Remains in atrial fibrillation/flutter Asymptomatic  Lab work reviewed Typical creatinine running 1.9  EKG personally reviewed by myself on todays visit EKG Interpretation Date/Time:  Monday August 31 2024 15:47:59 EDT Ventricular Rate:  75 PR Interval:    QRS Duration:  164 QT Interval:  496 QTC Calculation: 553 R  Axis:   -54  Text Interpretation: Atrial flutter with occasional Premature ventricular complexes Left axis deviation Left bundle branch block When compared with ECG of 20-Jul-2024 11:25, Wide QRS rhythm has replaced Atrial fibrillation Confirmed by Perla Lye [phone]) on 08/31/2024 6:06:46 PM   Two-week holter monitor November 2024 after TAVR discharge demonstrated predominantly sinus rhythm with HR range 41-127, 3 episodes NSVT, ventricular ectopy burden2%, 3% afib burden with no RVR.   ABI and LE arterial doppler normal August 2024  Additional cardiac records reviewed  hospital non-STEMI June 2024 troponin 1300 Cardiac catheterization June 15, 2023 Severe multivessel coronary disease with unclear culprit Moderately reduced ejection fraction Transfer to [facility] Not a candidate for CABG Left circumflex felt to be subtotal, very small in caliber, diffuse moderate disease of mid LAD and large diagonal with heavy calcification  RCA with critical ostial and mid vessel disease with stents between with moderate in-stent restenosis, entire RCA severely calcified   Echo June 16, 2023 EF 35 to 40% Moderate aortic valve stenosis mean gradient 16 mmHg, peak gradient 25, estimated aortic valve area 0.8, Low-flow, low gradient  Seen by [facility] cardiology July 12, 2023 Statin held, spironolactone 12.53 days a week, Jardiance 10, Imdur  held, Lasix  as needed  On further follow-up losartan  added but caused orthostasis symptoms  Echo at [facility] September 05, 2023 Aortic valve stenosis mean gradient 21 mmHg peak gradient 35 Moderate MR EF essentially unchanged 35%   TAVR  end of November 2024  Admitted to the hospital with chest pain May 11, 2023 Troponin 1324 Cardiac catheterization performed June 15, 2023 Severe multivessel coronary disease with unclear culprit Ostial-proximal LCx subtotal 99 to 100% occlusion with <TIMI I flow Mild diffuse proximal LAD disease with 70% stenosis after short mid vessel  diagonal branch, the large Ramus-like first diagonal with focal 50% stenosis Heavily calcified ostial RCA (very difficult to engage) 95% stenosis followed by diffuse 40 to 60% ISR of the proximal to mid vessel stent followed by 99 % subtotal occlusion after the stent crossing major RV marginal branch. Brisk collaterals fill the PDA  Moderate reduced EF with basal to mid inferior essentially akinesis and inferolateral hypokinesis.  Transfer to [facility] Chest pain-free at [facility] Left circumflex felt to be subtotal, very small in caliber, diffuse moderate disease of mid LAD and large diagonal with heavy calcification RCA with critical ostial and mid vessel disease with stents between with moderate in-stent restenosis, entire RCA severely calcified CABG not felt to be a good candidate Eliquis , Plavix , isosorbide , Ranexa   Zio monitor from April 2024 Frequent episodes of tachycardia, frequent PACs 408 Supraventricular Tachycardia runs occurred, the run with the fastest interval lasting 5 beats with a max rate of 146 bpm, the longest lasting 25.4 secs with an avg rate of 110 bpm.  History of bradycardia  past medical history reviewed history of left parieto-occipital hemorrhagic stroke  seen by ophthamologist 3/22 who did HVF test which showed right inf>sup quandrantopsia. felt this was a new finding [name] denied any new visual field deficits.  He has been driving Seen in the Er 6/84/7977 Head MRI: nothing acute  01/16/2020,  ED for syncope 3 alcoholic beverages that night, which was more than his usual  woke around 2 AM, and after entering the kitchen, had a syncopal episode  --received his second COVID-19 vaccination 4 days prior. usually spends some time on the edge of the bed and seated before standing, in order to prevent dizziness  daughter reports the fall / LOC woke her from sleep She found him slumped over on the floor.  he was cold to the touch and unable to speak  blood pressure was  low.  EMS took his BP when they arrived and noted it was low.  MRA was without acute abnormality and showed mild chronic small vessel ischemic dz and moderate cerebral atrophy. chronic hemorrhagic L temporoparietal infarct.     fall while in [place] 2019 Was watching a football game, stood up and had a mechanical fall tripped over some shoes 10/24/2018 Suffered a hip fracture , was hospitalized  Following the surgery the physicians increased his lisinopril  up to 20 mg daily and placed him on aspirin  325 mg daily He has started to have some upset stomach, wonders if it could be the high-dose aspirin   February 2018 he had acute vision changes, word finding difficulty. He was taken by life flight to [facility] hospital MRI/MRA showing acute stroke Details of scan as below: 1. Acute left parieto-occipital intraparenchymal hematoma with adjacent subarachnoid hemorrhage. This most likely represents hemorrhagic transformation from an embolic infarct given its configuration. No underlying mass. No other findings to suggest amyloid angiopathy. 2. Normal MRA of the head.  He had a CT scan of the neck showing 50% stenosis on the left, 10% on the right  PMH:   has a past medical history of Acute embolic stroke (HCC) (02/01/2017), BPH (benign prostatic hyperplasia), Bradycardia (11/10/2015), Coronary artery disease, Dizziness (08/21/2012),  DYSPEPSIA (07/31/2010), Hyperlipidemia (05/16/2010), Hypertension, HYPERTENSION, BENIGN (05/16/2010), and Stenosis of right carotid artery (02/01/2017).  PSH:    Past Surgical History:  Procedure Laterality Date   ESOPHAGOGASTRODUODENOSCOPY (EGD) WITH PROPOFOL  N/A 01/08/2023   Procedure: ESOPHAGOGASTRODUODENOSCOPY (EGD) WITH PROPOFOL ;  Surgeon: Jinny Carmine, MD;  Location: [facility] ENDOSCOPY;  Service: Endoscopy;  Laterality: N/A;   HIP SURGERY     right hip    Intestinal Blockage     LEFT HEART CATH AND CORONARY ANGIOGRAPHY N/A 06/15/2023   Procedure: LEFT HEART CATH AND CORONARY  ANGIOGRAPHY;  Surgeon: Anner Alm ORN, MD;  Location: [facility] INVASIVE CV LAB;  Service: Cardiovascular;  Laterality: N/A;   LOOP RECORDER INSERTION N/A 05/14/2017   Procedure: Loop Recorder Insertion;  Surgeon: Fernande Elspeth BROCKS, MD;  Location: [facility] INVASIVE CV LAB;  Service: Cardiovascular;  Laterality: N/A;   SKIN CANCER EXCISION     x2   TONSILLECTOMY      Current Outpatient Medications  Medication Sig Dispense Refill   acetaminophen  (TYLENOL ) 650 MG CR tablet Take 650 mg by mouth every 8 (eight) hours as needed.      apixaban  (ELIQUIS ) 2.5 MG TABS tablet Take 1 tablet (2.5 mg total) by mouth 2 (two) times daily. 180 tablet 3   Ascorbic Acid (VITAMIN C ) 1000 MG tablet Take 1,000 mg by mouth daily.     atorvastatin  (LIPITOR) 20 MG tablet Take 20 mg by mouth at bedtime.     b complex vitamins tablet Take 1 tablet by mouth daily.     cholecalciferol  (VITAMIN D ) 1000 units tablet Take 2,000 Units by mouth daily.     clopidogrel  (PLAVIX ) 75 MG tablet Take 1 tablet (75 mg total) by mouth daily. 90 tablet 0   cyanocobalamin  (VITAMIN B12) 1000 MCG tablet Take 1,000 mcg by mouth daily.     docusate sodium  (COLACE) 100 MG capsule Take 100 mg by mouth 2 (two) times daily.     empagliflozin (JARDIANCE) 25 MG TABS tablet Take 12.5 mg by mouth.     ezetimibe  (ZETIA ) 10 MG tablet Take 1 tablet (10 mg total) by mouth daily. 90 tablet 3   FLAXSEED, LINSEED, PO Take 2 g by mouth daily.     gentamicin  cream (GARAMYCIN ) 0.1 % Apply to affected toe once daily. 30 g 1   metoprolol  succinate (TOPROL -XL) 25 MG 24 hr tablet Take 12.5 mg by mouth daily.     nitroGLYCERIN  (NITROSTAT ) 0.4 MG SL tablet Place 1 tablet (0.4 mg total) under the tongue every 5 (five) minutes x 3 doses as needed for chest pain. 25 tablet 0   pantoprazole  (PROTONIX ) 40 MG tablet Take 1 tablet (40 mg total) by mouth 2 (two) times daily. 60 tablet 1   potassium chloride  (KLOR-CON  M) 10 MEQ tablet Take 10 mEq by mouth daily.     pregabalin   (LYRICA ) 150 MG capsule Take 150 mg by mouth at bedtime.     Probiotic Product (UP4 PROBIOTICS PO) Take 7.5 mg by mouth.     Psyllium (GERI-MUCIL) 25 % POWD Take by mouth.     ranolazine  (RANEXA ) 500 MG 12 hr tablet Take 1 tablet (500 mg total) by mouth 2 (two) times daily. 180 tablet 3   silver  sulfADIAZINE  (SILVADENE ) 1 % cream Apply 1 Application topically daily. 125 each 0   tamsulosin  (FLOMAX ) 0.4 MG CAPS capsule Take 2 capsules (0.8 mg total) by mouth daily. 30 capsule 0   torsemide  (DEMADEX ) 20 MG tablet Alternate daily: 20 mg (1 tab) one  day and 40 mg (2 tabs) the next day 270 tablet 3   No current facility-administered medications for this visit.    Allergies:   Penicillins, Gabapentin, Penicillin g, and Codeine   Social History:  The patient  reports that he has never smoked. He has never used smokeless tobacco. He reports that he does not currently use alcohol. He reports that he does not use drugs.   Family History:   family history includes Cancer in an other family member; Hyperlipidemia in an other family member; Hypertension in his mother and another family member; Other in an other family member.    Review of Systems: Review of Systems  Constitutional: Negative.   HENT: Negative.    Respiratory: Negative.    Cardiovascular: Negative.   Gastrointestinal: Negative.   Musculoskeletal:  Positive for joint pain.  Neurological:  Positive for tremors.  Psychiatric/Behavioral:  Positive for memory loss.   All other systems reviewed and are negative.   PHYSICAL EXAM: VS:  BP 110/70 (BP Location: Left Arm, Patient Position: Sitting, Cuff Size: Normal)   Pulse 75   Wt 177 lb 2 oz (80.3 kg)   SpO2 97%   BMI 26.93 kg/m  , BMI Body mass index is 26.93 kg/m. Constitutional:  oriented to person, place, and time. No distress.  HENT:  Head: Grossly normal Eyes:  no discharge. No scleral icterus.  Neck: No JVD, no carotid bruits  Cardiovascular: Regular rate and rhythm, no  murmurs appreciated, 2+ pitting woody edema left leg through the thigh, right leg to the knee Pulmonary/Chest: Clear to auscultation bilaterally, no wheezes or rails Abdominal: Soft.  Moderate  distension.  no tenderness.  Musculoskeletal: Normal range of motion Neurological:  normal muscle tone. Coordination normal. No atrophy Skin: Skin warm and dry Psychiatric: normal affect, pleasant  Recent Labs: 06/28/2024: ALT 16; BUN 27; Creatinine, Ser 1.64; Hemoglobin 12.7; Platelets 123; Potassium 3.2; Sodium 138    Lipid Panel Lab Results  Component Value Date   CHOL 90 06/16/2023   HDL 40 (L) 06/16/2023   LDLCALC 44 06/16/2023   TRIG 30 06/16/2023    Wt Readings from Last 3 Encounters:  08/31/24 177 lb 2 oz (80.3 kg)  07/23/24 170 lb 6.1 oz (77.3 kg)  07/20/24 170 lb 6 oz (77.3 kg)     ASSESSMENT AND PLAN:  Atrial fibrillation/atrial flutter -Remains in atrial flutter, rate controlled Likely contributing to CHF symptoms Continue Eliquis  2.5 twice daily, given age over 56 creatinine over 1.5 - We have had prior discussions concerning returning normal sinus rhythm, in the past they prefer to avoid general anesthesia  Leg swelling -Weight trending back upwards 177 pounds today, goal weight 168 Recommend we restart torsemide  40 daily Once weight reaches goal 168 will alternate 40 with 20 mg  Aortic valve stenosis Severe stenosis low gradient, status post TAVR  Hyperlipidemia, unspecified hyperlipidemia type - Continue Lipitor 20 mg daily  HYPERTENSION, BENIGN -  Blood pressure is well controlled on today's visit. No changes made to the medications.  Atherosclerosis of native coronary artery of native heart with chronic stable angina pectoris - Severe three-vessel coronary disease not amenable to intervention Was seen by interventional at [facility] Tolerating Ranexa , metoprolol   Imdur  held by team at [facility] Denies chest pain concerning for angina  syncope, unspecified  syncope type - Plan: EKG 12-Lead Remote syncope No recent episodes of near syncope or syncope  Acute embolic stroke (HCC) Continue Eliquis  2.5 twice daily, Plavix  75 daily  Stenosis of right carotid artery Nonobstructive disease, aggressive lipid management  Essential tremor Followed by neurology  Cardiomyopathy Ejection fraction 30 to 35%, slight decrease in the setting of atrial fibrillation or Continue metoprolol  succinate, Jardiance ARB on hold secondary to low blood pressure, worsening renal function Torsemide  as above  Orders Placed This Encounter  Procedures   Basic metabolic panel with GFR   EKG [id number]     Signed, Velinda Lunger, M.D., Ph.D. 08/31/2024  [facility] [place], [place] [phone]

## 2024-08-31 ENCOUNTER — Encounter: Payer: Self-pay | Admitting: Cardiovascular Disease

## 2024-08-31 ENCOUNTER — Ambulatory Visit: Attending: Cardiovascular Disease | Admitting: Cardiovascular Disease

## 2024-08-31 VITALS — BP 110/70 | HR 75 | Wt 177.1 lb

## 2024-08-31 DIAGNOSIS — Z952 Presence of prosthetic heart valve: Secondary | ICD-10-CM | POA: Diagnosis present

## 2024-08-31 DIAGNOSIS — E785 Hyperlipidemia, unspecified: Secondary | ICD-10-CM | POA: Diagnosis present

## 2024-08-31 DIAGNOSIS — I10 Essential (primary) hypertension: Secondary | ICD-10-CM | POA: Insufficient documentation

## 2024-08-31 DIAGNOSIS — Z79899 Other long term (current) drug therapy: Secondary | ICD-10-CM | POA: Diagnosis present

## 2024-08-31 DIAGNOSIS — I495 Sick sinus syndrome: Secondary | ICD-10-CM | POA: Insufficient documentation

## 2024-08-31 DIAGNOSIS — I5023 Acute on chronic systolic (congestive) heart failure: Secondary | ICD-10-CM | POA: Insufficient documentation

## 2024-08-31 DIAGNOSIS — I4819 Other persistent atrial fibrillation: Secondary | ICD-10-CM | POA: Insufficient documentation

## 2024-08-31 DIAGNOSIS — I471 Supraventricular tachycardia, unspecified: Secondary | ICD-10-CM | POA: Insufficient documentation

## 2024-08-31 DIAGNOSIS — I2510 Atherosclerotic heart disease of native coronary artery without angina pectoris: Secondary | ICD-10-CM | POA: Diagnosis not present

## 2024-08-31 DIAGNOSIS — N179 Acute kidney failure, unspecified: Secondary | ICD-10-CM | POA: Diagnosis present

## 2024-08-31 DIAGNOSIS — N183 Chronic kidney disease, stage 3 unspecified: Secondary | ICD-10-CM | POA: Diagnosis present

## 2024-08-31 DIAGNOSIS — I4892 Unspecified atrial flutter: Secondary | ICD-10-CM | POA: Diagnosis present

## 2024-08-31 DIAGNOSIS — I350 Nonrheumatic aortic (valve) stenosis: Secondary | ICD-10-CM | POA: Insufficient documentation

## 2024-08-31 NOTE — Patient Instructions (Addendum)
 Medication Instructions:   Torsemide  40 mg daily, Potassium 40 daily Until weight 168 pounds  Then alternate torsemide  40 mg and 20 mg daily  *If you need a refill on your cardiac medications before your next appointment, please call your pharmacy*  Lab Work:  BMP for weight 168 to 170 pounds  Please go to [facility] [address] ([facility] [address], [place] [address] You do not need an appointment.  They are open from 8 am- 4:30 pm.  Lunch from 1:00 pm- 2:00 pm You will not need to be fasting.  If you have labs (blood work) drawn today and your tests are completely normal, you will receive your results only by: MyChart Message (if you have MyChart) OR A paper copy in the mail If you have any lab test that is abnormal or we need to change your treatment, we will call you to review the results.  Testing/Procedures:  No test ordered today   Follow-Up: At [facility], you and your health needs are our priority.  As part of our continuing mission to provide you with exceptional heart care, our providers are all part of one team.  This team includes your primary Cardiologist (physician) and Advanced Practice Providers or APPs (Physician Assistants and Nurse Practitioners) who all work together to provide you with the care you need, when you need it.  Your next appointment:   1 month(s)  Provider:   You may see Timothy Gollan, MD or one of the following Advanced Practice Providers on your designated Care Team:   Lonni Meager, NP Lesley Maffucci, PA-C Bernardino Bring, PA-C Cadence [place], PA-C Tylene Lunch, NP Barnie Hila, NP    We recommend signing up for the patient portal called MyChart.  Sign up information is provided on this After Visit Summary.  MyChart is used to connect with patients for Virtual Visits (Telemedicine).  Patients are able to view lab/test results, encounter notes, upcoming appointments, etc.  Non-urgent messages  can be sent to your provider as well.   To learn more about what you can do with MyChart, go to [email or web address].

## 2024-09-01 NOTE — Telephone Encounter (Signed)
 Patient seen in clinic 08/31/24 all questions and concerns addressed at that time.

## 2024-09-04 ENCOUNTER — Encounter: Payer: Self-pay | Admitting: Cardiovascular Disease

## 2024-09-10 ENCOUNTER — Ambulatory Visit: Admitting: Podiatry

## 2024-09-10 ENCOUNTER — Encounter: Payer: Self-pay | Admitting: Podiatry

## 2024-09-10 DIAGNOSIS — B351 Tinea unguium: Secondary | ICD-10-CM | POA: Diagnosis not present

## 2024-09-10 DIAGNOSIS — L97512 Non-pressure chronic ulcer of other part of right foot with fat layer exposed: Secondary | ICD-10-CM

## 2024-09-10 DIAGNOSIS — G629 Polyneuropathy, unspecified: Secondary | ICD-10-CM

## 2024-09-10 DIAGNOSIS — M79676 Pain in unspecified toe(s): Secondary | ICD-10-CM

## 2024-09-11 ENCOUNTER — Encounter: Payer: Self-pay | Admitting: Cardiovascular Disease

## 2024-09-11 DIAGNOSIS — I5020 Unspecified systolic (congestive) heart failure: Secondary | ICD-10-CM

## 2024-09-16 NOTE — Progress Notes (Signed)
 Subjective:  Patient ID: Calvin Jimmy Armand Mickey., male    DOB: 1933-05-14,  MRN: [id number]  Calvin Jimmy Armand Mickey. presents to clinic today for at risk foot care with history of peripheral neuropathy and preulcerative lesion(s) right great toe and painful mycotic toenails that limit ambulation. Painful toenails interfere with ambulation. Aggravating factors include wearing enclosed shoe gear. Pain is relieved with periodic professional debridement. Painful preulcerative lesion(s) is/are aggravated when weightbearing with and without shoegear. Pain is relieved with periodic professional debridement.   He is accompanied by his wife on today's visit. Patient was seeing Dr. Tobie for ulcer of right great toe which had healed.  Chief Complaint  Patient presents with   Toe Pain    RFC. Dr. Auston is his PCP. Last office visit was in August    PCP is Calvin Reyes BIRCH, [name].  Allergies  Allergen Reactions   Penicillins Dermatitis and Rash    Rash   Gabapentin    Penicillin G    Codeine Nausea Only and Nausea And Vomiting    Other reaction(s): Vomiting Other reaction(s): Vomiting   Review of Systems: Negative except as noted in the HPI.  Objective: No changes noted in today's physical examination. There were no vitals filed for this visit. Calvin Jimmy [employer]. is a pleasant 89 y.o. male WD, WN in NAD. AAO x 3.  CFT immediate b/l LE. Palpable DP/PT pulses b/l LE. Digital hair present b/l. Skin temperature gradient WNL b/l. No pain with calf compression b/l. No edema noted b/l. No cyanosis or clubbing noted b/l LE. Protective sensation diminished with 10g monofilament b/l. Muscle strength 5/5 to all lower extremity muscle groups bilaterally. No pain, crepitus or joint limitation noted with ROM b/l LE. Clawtoe deformity L 3rd toe.. Patient ambulates with cane assistance.  Pedal skin is warm and supple b/l LE. No interdigital macerations noted b/l LE.  No images are attached to the  encounter.  Wound Location: plantarmedial IPJ right hallux There is a small amount of devitalized tissue present in the wound. Predebridement Wound Measurement:  hemorrhagic hyperkeratotic lesion which is fluctuant Postdebridement Wound Measurement: 0.9 x 1.4 x 0.2 cm. Wound Base: Mixed Granular/Fibrotic Peri-wound: Normal Exudate: None: wound tissue dry Blood Loss during debridement: 0 cc('s). Sign(s) of clinical bacterial infection: no clinical signs of infection noted on examination today.   Assessment/Plan: 1. Pain due to onychomycosis of toenail   2. Ulcer of great toe, right, with fat layer exposed (HCC)   3. Peripheral polyneuropathy    Plan: -Patient was evaluated and treated and all questions answered.  -Patient/POA/Family member educated on diagnosis and treatment plan of routine ulcer debridement/wound care.  -Ulceration debridement achieved utilizing sharp excisional debridement to level of subcutaneous tissue with sterile scalpel blade and sterile currette.. Type/amount of devitalized tissue removed: necrotic tissue -Today's ulcer size post-debridement: 0.9 x 1.4 x 0.2 cm. -Ulceration cleansed with wound cleanser. triple antibiotic ointment applied to base of ulceration and secured with light dressing. -Wife will perform daily dressing changes with Gentamicin  cream once daily. Patient will follow up with Dr. Tobie in 2 weeks. He has no signs of clinical infection and wife prefers for him not to have oral antibiotics unless really needed. Call office if there are any changes such as  redness warmth, drainage or swelling, fever, chills, night sweats, nausea or vomiting. -Wound responded well to today's debridement. -Mycotic toenails 1-5 bilaterally were debrided in length and girth with sterile nail nippers and dremel without incident. -Patient/POA to  call should there be question/concern in the interim.  Return in about 3 months (around 12/11/2024).  [name], [name]       [facility] LOCATION: [address], [place] [address]                   Office [phone]   [facility] LOCATION: [address] [place], [place] [address] Office [phone]

## 2024-09-22 ENCOUNTER — Ambulatory Visit (INDEPENDENT_AMBULATORY_CARE_PROVIDER_SITE_OTHER): Admitting: Podiatry

## 2024-09-22 DIAGNOSIS — L97512 Non-pressure chronic ulcer of other part of right foot with fat layer exposed: Secondary | ICD-10-CM | POA: Diagnosis not present

## 2024-09-22 NOTE — Progress Notes (Signed)
 Subjective:  Patient ID: [name]., male    DOB: November 06, 1933,  MRN: [id number]  Chief Complaint  Patient presents with   Toe Pain    Right foot great toe Ulcer     89 y.o. male presents with the above complaint.  Patient presents with complaint of right great toe superficial ulceration with fat layer exposed.  He states that started coming back again denies any other acute complaints.  Would like to discuss next treatment plan  Review of Systems: Negative except as noted in the HPI. Denies N/V/F/Ch.  Past Medical History:  Diagnosis Date   Acute embolic stroke (HCC) 02/01/2017   BPH (benign prostatic hyperplasia)    Bradycardia 11/10/2015   Coronary artery disease    Dizziness 08/21/2012   DYSPEPSIA 07/31/2010   Qualifier: Diagnosis of  By: Sebastian RN, Rosina     Hyperlipidemia 05/16/2010   Qualifier: Diagnosis of  By: Perla MD, [name]      Hypertension    HYPERTENSION, BENIGN 05/16/2010   Qualifier: Diagnosis of  By: Perla MD, [name]     Stenosis of right carotid artery 02/01/2017    Current Outpatient Medications:    acetaminophen  (TYLENOL ) 650 MG CR tablet, Take 650 mg by mouth every 8 (eight) hours as needed. , Disp: , Rfl:    apixaban  (ELIQUIS ) 2.5 MG TABS tablet, Take 1 tablet (2.5 mg total) by mouth 2 (two) times daily., Disp: 180 tablet, Rfl: 3   Ascorbic Acid (VITAMIN C ) 1000 MG tablet, Take 1,000 mg by mouth daily., Disp: , Rfl:    atorvastatin  (LIPITOR) 20 MG tablet, Take 20 mg by mouth at bedtime., Disp: , Rfl:    b complex vitamins tablet, Take 1 tablet by mouth daily., Disp: , Rfl:    cholecalciferol  (VITAMIN D ) 1000 units tablet, Take 2,000 Units by mouth daily., Disp: , Rfl:    clopidogrel  (PLAVIX ) 75 MG tablet, Take 1 tablet (75 mg total) by mouth daily., Disp: 90 tablet, Rfl: 0   cyanocobalamin  (VITAMIN B12) 1000 MCG tablet, Take 1,000 mcg by mouth daily., Disp: , Rfl:    docusate sodium  (COLACE) 100 MG capsule, Take 100 mg by mouth 2 (two) times  daily., Disp: , Rfl:    empagliflozin (JARDIANCE) 25 MG TABS tablet, Take 12.5 mg by mouth., Disp: , Rfl:    ezetimibe  (ZETIA ) 10 MG tablet, Take 1 tablet (10 mg total) by mouth daily., Disp: 90 tablet, Rfl: 3   FLAXSEED, LINSEED, PO, Take 2 g by mouth daily., Disp: , Rfl:    gentamicin  cream (GARAMYCIN ) 0.1 %, Apply to affected toe once daily., Disp: 30 g, Rfl: 1   metoprolol  succinate (TOPROL -XL) 25 MG 24 hr tablet, Take 12.5 mg by mouth daily., Disp: , Rfl:    nitroGLYCERIN  (NITROSTAT ) 0.4 MG SL tablet, Place 1 tablet (0.4 mg total) under the tongue every 5 (five) minutes x 3 doses as needed for chest pain., Disp: 25 tablet, Rfl: 0   pantoprazole  (PROTONIX ) 40 MG tablet, Take 1 tablet (40 mg total) by mouth 2 (two) times daily., Disp: 60 tablet, Rfl: 1   potassium chloride  (KLOR-CON  M) 10 MEQ tablet, Take 10 mEq by mouth daily., Disp: , Rfl:    pregabalin  (LYRICA ) 150 MG capsule, Take 150 mg by mouth at bedtime., Disp: , Rfl:    Probiotic Product (UP4 PROBIOTICS PO), Take 7.5 mg by mouth., Disp: , Rfl:    Psyllium (GERI-MUCIL) 25 % POWD, Take by mouth., Disp: , Rfl:  ranolazine  (RANEXA ) 500 MG 12 hr tablet, Take 1 tablet (500 mg total) by mouth 2 (two) times daily., Disp: 180 tablet, Rfl: 3   silver  sulfADIAZINE  (SILVADENE ) 1 % cream, Apply 1 Application topically daily., Disp: 125 each, Rfl: 0   tamsulosin  (FLOMAX ) 0.4 MG CAPS capsule, Take 2 capsules (0.8 mg total) by mouth daily., Disp: 30 capsule, Rfl: 0   torsemide  (DEMADEX ) 20 MG tablet, Alternate daily: 20 mg (1 tab) one day and 40 mg (2 tabs) the next day, Disp: 270 tablet, Rfl: 3  Social History   Tobacco Use  Smoking Status Never  Smokeless Tobacco Never    Allergies  Allergen Reactions   Penicillins Dermatitis and Rash    Rash   Gabapentin    Penicillin G    Codeine Nausea Only and Nausea And Vomiting    Other reaction(s): Vomiting Other reaction(s): Vomiting   Objective:  There were no vitals filed for this  visit. There is no height or weight on file to calculate BMI. Constitutional Well developed. Well nourished.  Vascular Dorsalis pedis pulses palpable bilaterally. Posterior tibial pulses palpable bilaterally. Capillary refill normal to all digits.  No cyanosis or clubbing noted. Pedal hair growth normal.  Neurologic Normal speech. Oriented to person, place, and time. Epicritic sensation to light touch grossly present bilaterally.  Dermatologic Right hallux superficial ulceration with fat layer exposed.  Does not probe down to deep tissue.  No purulent drainage noted no malodor present no erythema noted  Orthopedic: Normal joint ROM without pain or crepitus bilaterally. No visible deformities. No bony tenderness.   Radiographs: None Assessment:   No diagnosis found.  Plan:  Patient was evaluated and treated and all questions answered.  Right hallux ulcer fat layer exposed - All questions or concerns were discussed with the patient in extensive detail - Given the amount of ulceration patient will benefit from combination of Betadine and Silvadene  cream.  I encouraged him to use both of them.  I discussed with them if he gets too wet to use Betadine once it dries up to use Silvadene  they state understanding will do so  - Offloading pads were dispensed  No follow-ups on file.

## 2024-09-30 ENCOUNTER — Encounter: Payer: Self-pay | Admitting: Physician Assistant

## 2024-09-30 ENCOUNTER — Ambulatory Visit: Attending: Physician Assistant | Admitting: Physician Assistant

## 2024-09-30 VITALS — BP 112/62 | HR 57 | Ht 68.0 in | Wt 174.0 lb

## 2024-09-30 DIAGNOSIS — I350 Nonrheumatic aortic (valve) stenosis: Secondary | ICD-10-CM | POA: Insufficient documentation

## 2024-09-30 DIAGNOSIS — I5023 Acute on chronic systolic (congestive) heart failure: Secondary | ICD-10-CM | POA: Diagnosis not present

## 2024-09-30 DIAGNOSIS — N183 Chronic kidney disease, stage 3 unspecified: Secondary | ICD-10-CM | POA: Insufficient documentation

## 2024-09-30 DIAGNOSIS — I2510 Atherosclerotic heart disease of native coronary artery without angina pectoris: Secondary | ICD-10-CM | POA: Diagnosis present

## 2024-09-30 DIAGNOSIS — Z8673 Personal history of transient ischemic attack (TIA), and cerebral infarction without residual deficits: Secondary | ICD-10-CM | POA: Insufficient documentation

## 2024-09-30 DIAGNOSIS — E785 Hyperlipidemia, unspecified: Secondary | ICD-10-CM | POA: Diagnosis present

## 2024-09-30 DIAGNOSIS — I495 Sick sinus syndrome: Secondary | ICD-10-CM | POA: Diagnosis not present

## 2024-09-30 DIAGNOSIS — I10 Essential (primary) hypertension: Secondary | ICD-10-CM | POA: Diagnosis present

## 2024-09-30 DIAGNOSIS — Z79899 Other long term (current) drug therapy: Secondary | ICD-10-CM | POA: Diagnosis present

## 2024-09-30 DIAGNOSIS — I4819 Other persistent atrial fibrillation: Secondary | ICD-10-CM | POA: Insufficient documentation

## 2024-09-30 DIAGNOSIS — Z952 Presence of prosthetic heart valve: Secondary | ICD-10-CM | POA: Diagnosis present

## 2024-09-30 DIAGNOSIS — I471 Supraventricular tachycardia, unspecified: Secondary | ICD-10-CM | POA: Insufficient documentation

## 2024-09-30 MED ORDER — POTASSIUM CHLORIDE CRYS ER 20 MEQ PO TBCR: 20.0000 meq | EXTENDED_RELEASE_TABLET | Freq: Every day | ORAL | 3 refills | Status: DC

## 2024-09-30 MED ORDER — TORSEMIDE 40 MG PO TABS: 40.0000 mg | ORAL_TABLET | Freq: Two times a day (BID) | ORAL | 3 refills | Status: DC

## 2024-09-30 NOTE — Progress Notes (Signed)
 Cardiology Office Note    Date:  09/30/2024   ID:  [name]., DOB June 09, 1933, MRN [id number]  PCP:  Auston Reyes BIRCH, MD  Cardiologist:  Evalene Lunger, MD  Electrophysiologist:  None   Chief Complaint: Follow up  History of Present Illness:   [name]. is a 89 y.o. male with history of CAD s/p PCI with Taxus stent to the RCA in 2007, HFrEF, persistent atrial fibrillation/flutter, severe aortic stenosis s/p TAVR at [facility] 10/2023, PSVT, CVA in 2018 with hemorrhagic transformation of embolic infarct, bleeding ulcer requiring transfusion 01/2023, syncope of unclear etiology 2021, mechanical falls, carotid artery stenosis, dementia, CKD stage III, hypertension, and hyperlipidemia who presents for follow up on atrial fibrillation and HFrEF.    Patient was admitted in 2018 with CVA.  Subsequent prolonged outpatient cardiac monitoring showed no evidence of atrial fibrillation at that time with some episodes of tachycardia and bradycardia.  Echo 02/2020 showed EF of 55% with no RWMA and G1 DD.  There is moderate aortic valve sclerosis with borderline mild aortic stenosis at that time.  Lexiscan  MPI 03/2020 showed no significant ischemia and was overall low risk.  Repeat outpatient cardiac monitoring 01/2022 showed predominantly sinus rhythm with 156 episodes of SVT lasting up to 19 beats and idioventricular rhythm, frequent PACs, and rare ventricular ectopy.  Repeat outpatient cardiac monitoring 03/2023 showed predominantly sinus rhythm with average heart rate of 61 bpm ranging 38 to 146 bpm, 1 run of NSVT lasting 9 beats, 408 episodes of SVT lasting up to 25 seconds, frequent PACs with a burden of 7.9%, occasional atrial couplets with a burden of 1.4%, and occasional PVCs with a burden of 1.8%.  He was admitted 06/2023 with NSTEMI troponin up to 1300.  Cardiac catheterization showed severe multivessel CAD with no clear culprit.  He was not felt to be a candidate for CABG.   Echo 06/2023 showed EF 35 to 40% with moderate aortic stenosis and mean gradient of 60 mmHg, peak gradient 25 mmHg and an estimated valve area of 0.8 cm with low-flow low gradient.  He was later found to be in atrial fibrillation by EKG 08/2023.  In the setting he was evaluated for watchman at [facility] with recommendation to undergo repair of aortic stenosis initially.  Echo at [facility] in 09/2023 showed EF unchanged at 35% with severe aortic stenosis with a mean gradient of 21 mmHg and peak gradient 35 mmHg along with moderate mitral regurgitation.  Plan was to proceed with TAVR.  He underwent TAVR at [facility] 10/2023 with procedure complicated by complete heart block requiring temporary wire without permanent pacemaker implantation needed.  Postprocedure limited echo showed normal functioning aortic valve prosthesis.  Patient was treated for URI/bronchitis after traveling to the [facility] and given antibiotics by outside primary MD.  He was seen in our office 05/2024 with reported increased shortness of breath and lower extremity swelling and had been started on Lasix .  His weight was up approximately 6 to 10 pounds from baseline.  EKG in the office showed new onset atrial flutter.  He was continued on reduced dose apixaban  (notes indicate reduced dose by [facility] due to advanced age, prior bleeding, and with plans for possible watchman evaluation).  Was also recommended he increase Lasix  to 20 mg twice daily with potassium repletion.  He was seen in follow-up 06/2024 with continued lower extremity swelling on Lasix  20 mg twice daily.  His weight was down 6 pounds compared to his last visit.  With labs noted to be stable at that time he was transitioned from Lasix  to torsemide  20 mg twice daily for 3 days followed by 20 mg daily thereafter  He followed up with [facility] cardiology 06/2024 and remained volume overloaded with recommendation to take torsemide  40 mg on even number days if weight greater than 155 or 20 mg on half number of  days with no torsemide  if weight under 150 pounds.  Labs obtained at that time showed an elevated though improving proBNP and continued renal dysfunction slightly above baseline.  In the setting, they were subsequently advised to take torsemide  20 mg alternating with 40 mg every other day.  Patient was seen in our office 07/01/2024 reporting mild lower extremity swelling with weight down 3 to 4 pounds compared to prior visit.  Patient's wife reported progressive cognitive decline and provided most of his history.  Echo 06/2024 showed EF 30 to 35% with global hypokinesis, moderately reduced RV systolic function, and moderate MR. Seen in follow-up by Dr. Gollan 07/20/2024 with weight down 10 pounds from prior clinic visit and only mild lower extremity swelling.  Seen by [facility] cardiology 07/22/2024 still doing well and euvolemic.  Labs were checked and showed mild bump in creatinine for which torsemide  was titrated to 20 mg daily.   Patient was most recently seen in our office 08/31/2024 with weight trending upwards, worsening abdominal swelling, and lower extremity swelling.  Weight was up 7 pounds since last office visit.  He remained in atrial fibrillation which was felt to be likely contributing to volume overload, although patient and wife preferred to avoid anesthesia.  Recommended increasing diuretic to torsemide  40 mg daily.  Patient was seen by [facility] cardiology 09/21/2024 and reported 1 month of worsening fatigue, dyspnea on exertion, and weakness.  Weight was 173 lbs with dry weight estimated ~164 lbs.  He was volume overloaded with edema extending into his thighs and JVP.  He was given IV Lasix  60 mg x 1 and instructed to increase torsemide  to 40 mg in the morning and 20 mg in the evening for 2 days.  He remained in atrial fibrillation without any missed doses of DOAC.  He was started on oral amiodarone load in anticipation for DCCV which is scheduled for 10/07/2024. Holter monitor was placed to assess afib  burden.   Patient presents to clinic today accompanied by his wife who assists with history given patient's underlying dementia.  He continues to have exertional dyspnea, abdominal distention, and lower extremity swelling.  Patient's wife reports she noticed some improvement in symptoms after he received IV Lasix  and increased his torsemide  to twice daily for 2 days following his appointment at [facility].  However, she reports the swelling started accumulating shortly after reducing to daily dosing.  They brought the results of recent Holter monitor which showed 100% atrial fibrillation burden.  He remains in atrial fibrillation today on EKG today.  Weight is 174 pounds with his wife estimating his dry weight to be between 164-168 pounds.  He denies chest pain, palpitations, lightheadedness, and dizziness.  No orthopnea or PND.  Labs independently reviewed: 09/2024-proBNP 4481, sodium 137, potassium 3.1, BUN 23, creatinine 1.8, hemoglobin 12.7, hematocrit 38, platelet 123, TC 106, TG 83, HDL 46, LDL 43  Objective   Past Medical History:  Diagnosis Date   Acute embolic stroke (HCC) 02/01/2017   BPH (benign prostatic hyperplasia)    Bradycardia 11/10/2015   Coronary artery disease    Dizziness 08/21/2012   DYSPEPSIA 07/31/2010  Qualifier: Diagnosis of  By: Sebastian RN, Rosina     Hyperlipidemia 05/16/2010   Qualifier: Diagnosis of  By: Perla MD, [name]      Hypertension    HYPERTENSION, BENIGN 05/16/2010   Qualifier: Diagnosis of  By: Perla MD, [name]     Stenosis of right carotid artery 02/01/2017    Current Medications: Current Meds  Medication Sig   acetaminophen  (TYLENOL ) 650 MG CR tablet Take 650 mg by mouth every 8 (eight) hours as needed.    apixaban  (ELIQUIS ) 2.5 MG TABS tablet Take 1 tablet (2.5 mg total) by mouth 2 (two) times daily.   Ascorbic Acid (VITAMIN C ) 1000 MG tablet Take 1,000 mg by mouth daily.   atorvastatin  (LIPITOR) 20 MG tablet Take 20 mg by mouth at bedtime.   b complex  vitamins tablet Take 1 tablet by mouth daily.   cholecalciferol  (VITAMIN D ) 1000 units tablet Take 2,000 Units by mouth daily.   clopidogrel  (PLAVIX ) 75 MG tablet Take 1 tablet (75 mg total) by mouth daily.   cyanocobalamin  (VITAMIN B12) 1000 MCG tablet Take 1,000 mcg by mouth daily.   docusate sodium  (COLACE) 100 MG capsule Take 100 mg by mouth 2 (two) times daily.   empagliflozin (JARDIANCE) 25 MG TABS tablet Take 12.5 mg by mouth.   ezetimibe  (ZETIA ) 10 MG tablet Take 1 tablet (10 mg total) by mouth daily.   FLAXSEED, LINSEED, PO Take 2 g by mouth daily.   gentamicin  cream (GARAMYCIN ) 0.1 % Apply to affected toe once daily.   metoprolol  succinate (TOPROL -XL) 25 MG 24 hr tablet Take 12.5 mg by mouth daily.   nitroGLYCERIN  (NITROSTAT ) 0.4 MG SL tablet Place 1 tablet (0.4 mg total) under the tongue every 5 (five) minutes x 3 doses as needed for chest pain.   pantoprazole  (PROTONIX ) 40 MG tablet Take 1 tablet (40 mg total) by mouth 2 (two) times daily.   pregabalin  (LYRICA ) 150 MG capsule Take 150 mg by mouth at bedtime.   Probiotic Product (UP4 PROBIOTICS PO) Take 7.5 mg by mouth.   Psyllium (GERI-MUCIL) 25 % POWD Take by mouth.   ranolazine  (RANEXA ) 500 MG 12 hr tablet Take 1 tablet (500 mg total) by mouth 2 (two) times daily.   silver  sulfADIAZINE  (SILVADENE ) 1 % cream Apply 1 Application topically daily.   tamsulosin  (FLOMAX ) 0.4 MG CAPS capsule Take 2 capsules (0.8 mg total) by mouth daily.   [DISCONTINUED] potassium chloride  (KLOR-CON  M) 10 MEQ tablet Take 10 mEq by mouth daily.   [DISCONTINUED] torsemide  (DEMADEX ) 20 MG tablet Alternate daily: 20 mg (1 tab) one day and 40 mg (2 tabs) the next day    Allergies:   Penicillins, Gabapentin, Penicillin g, and Codeine   Social History   Socioeconomic History   Marital status: Married    Spouse name: Not on file   Number of children: Not on file   Years of education: Not on file   Highest education level: Not on file  Occupational  History   Not on file  Tobacco Use   Smoking status: Never   Smokeless tobacco: Never  Vaping Use   Vaping status: Never Used  Substance and Sexual Activity   Alcohol use: Not Currently    Comment: occassionally   Drug use: No   Sexual activity: Not on file  Other Topics Concern   Not on file  Social History Narrative   Not on file   Social Drivers of Health   Financial Resource Strain: Not on  file  Food Insecurity: No Food Insecurity (06/17/2023)   Hunger Vital Sign    Worried About Running Out of Food in the Last Year: Never true    Ran Out of Food in the Last Year: Never true  Transportation Needs: No Transportation Needs (10/23/2023)   Received from [facility] - Transportation    In the past 12 months, has lack of transportation kept you from medical appointments or from getting medications?: No    Lack of Transportation (Non-Medical): No  Physical Activity: Not on file  Stress: Not on file  Social Connections: Not on file     Family History:  The patient's family history includes Cancer in an other family member; Hyperlipidemia in an other family member; Hypertension in his mother and another family member; Other in an other family member.  ROS:   12-point review of systems is negative unless otherwise noted in the HPI.  EKGs/Other Studies Reviewed:    Studies reviewed were summarized above. The additional studies were reviewed today:  06/2024 Echo complete 1. Left ventricular ejection fraction, by estimation, is 30 to 35%. The  left ventricle has moderately decreased function. The left ventricle  demonstrates global hypokinesis. The left ventricular internal cavity size  was mildly dilated. Left ventricular  diastolic parameters are indeterminate.   2. Right ventricular systolic function is moderately reduced. The right  ventricular size is normal. There is normal pulmonary artery systolic  pressure. The estimated right ventricular  systolic pressure is 33.7 mmHg.   3. Left atrial size was mildly dilated.   4. The mitral valve is normal in structure. Moderate mitral valve  regurgitation. No evidence of mitral stenosis.   5. The aortic valve has been repaired/replaced. Aortic valve  regurgitation is not visualized. No aortic stenosis is present. Aortic  valve mean gradient measures 3.2 mmHg.   6. The inferior vena cava is normal in size with greater than 50%  respiratory variability, suggesting right atrial pressure of 3 mmHg.     EKG:  EKG personally reviewed by me today EKG Interpretation Date/Time:  Wednesday September 30 2024 15:03:14 EDT Ventricular Rate:  70 PR Interval:    QRS Duration:  94 QT Interval:  446 QTC Calculation: 481 R Axis:   -7  Text Interpretation: Atrial fibrillation / flutter with occasional aberrancy Confirmed by Lorene Sinclair ([id number]) on 09/30/2024 3:07:38 PM  PHYSICAL EXAM:    VS:  BP 112/62 (BP Location: Left Arm, Patient Position: Sitting, Cuff Size: Normal)   Pulse (!) 57   Ht 5' 8 (1.727 m)   Wt 174 lb (78.9 kg)   SpO2 98%   BMI 26.46 kg/m   BMI: Body mass index is 26.46 kg/m.  GEN: Well nourished, well developed in no acute distress NECK: No JVD; No carotid bruits CARDIAC: IRIR, no murmurs, rubs, gallops RESPIRATORY:  Clear to auscultation without rales, wheezing or rhonchi  ABDOMEN: Abdomen is distended and slightly firm EXTREMITIES: 2+ lower extremity edema extending up to the knees bilaterally; No deformity  Wt Readings from Last 3 Encounters:  09/30/24 174 lb (78.9 kg)  08/31/24 177 lb 2 oz (80.3 kg)  07/23/24 170 lb 6.1 oz (77.3 kg)                  ASSESSMENT & PLAN:   Persistent atrial fibrillation PSVT Tachybradycardia syndrome - Recent 3-day Holter monitor 10/20 completed by [facility] showed 100% atrial fibrillation burden.  Patient remains in atrial fibrillation/flutter today which  is rate controlled on current low-dose of metoprolol  succinate 12.5 mg  daily.  He was started on oral amiodarone load by [facility] cardiology and scheduled for DCCV on 11/5.  I agree that atrial fibrillation is likely contributing to volume overloaded and that this is the best option moving forward.  He is continued on Eliquis  2.5 mg twice daily (age and renal function) for CHA2DS2-VASc 7.  Acute on chronic HFrEF - Most recent echo 06/2024 showed EF 30 to 35%.  He was seen by [facility] cardiology 10/20 and was noted to be volume overload.  He was given IV Lasix  60 mg x 1 and instructed to increase torsemide  to 40 mg in the a.m. and 20 mg in the p.m. for 2 days followed by 40 mg daily thereafter.  He remains volume overloaded today with weight the same as it was at that appointment.  Update BMP today.  Would recommend increasing torsemide  to 40 mg twice daily with subsequent increase of potassium to 20 mill equivalents daily.  He will have repeat labs and follow-up with [facility] after cardioversion.  He is continued on metoprolol  succinate 12.5 mg daily and Jardiance 10 mg daily.  Borderline hypotension prevents further escalation of GDMT.  Coronary artery disease - No symptoms concerning for angina.  No indication for further testing at this time.  Continue risk factor modification and secondary prevention with apixaban  in place of aspirin , atorvastatin  20 mg daily, ezetimibe  10 mg daily, metoprolol  succinate 12.5 mg daily, and Ranexa  500 mg twice daily.   Severe aortic stenosis - S/p TAVR 10/2023 at [facility]. Stable on recent echo 06/2024. SBE prophylaxis for dental procedures.  Hypertension - BP well-controlled on current regimen.  Hyperlipidemia - Most recent lipid panel 09/2024 with LDL 43.  He is continued on atorvastatin  and ezetimibe  as above.  CKD III - Creatinine 1.8 on 10/20.  Update BMP today.  He will need close monitoring of kidney function with ongoing diuresis.  Hx CVA - Continued on clopidogrel  and apixaban  in addition to atorvastatin  and ezetimibe .    Disposition:  Check BMP today.  Increase torsemide  to 40 mg twice daily.  Follow-up with [facility] cardiology for cardioversion as scheduled next week.  F/u with Dr. Gollan as scheduled on 11/18.   Medication Adjustments/Labs and Tests Ordered: Current medicines are reviewed at length with the patient today.  Concerns regarding medicines are outlined above. Medication changes, Labs and Tests ordered today are summarized above and listed in the Patient Instructions accessible in Encounters.   Signed, Lesley Maffucci, PA-C 09/30/2024 3:37 PM     [facility] [place] [address] [place], [place] [address] [phone]

## 2024-09-30 NOTE — Patient Instructions (Signed)
 Medication Instructions:  Your physician recommends the following medication changes.  INCREASE: Torsemide  40 mg twice daily Potassium 20 mEq daily   *If you need a refill on your cardiac medications before your next appointment, please call your pharmacy*  Lab Work: Your provider would like for you to have following labs drawn today BMeT.   If you have labs (blood work) drawn today and your tests are completely normal, you will receive your results only by: MyChart Message (if you have MyChart) OR A paper copy in the mail If you have any lab test that is abnormal or we need to change your treatment, we will call you to review the results.  Follow-Up: At [facility], you and your health needs are our priority.  As part of our continuing mission to provide you with exceptional heart care, our providers are all part of one team.  This team includes your primary Cardiologist (physician) and Advanced Practice Providers or APPs (Physician Assistants and Nurse Practitioners) who all work together to provide you with the care you need, when you need it.  Your next appointment:  With Dr Gollan in November

## 2024-10-01 ENCOUNTER — Ambulatory Visit: Payer: Self-pay | Admitting: Physician Assistant

## 2024-10-01 LAB — BASIC METABOLIC PANEL WITH GFR
BUN/Creatinine Ratio: 16 (ref 10–24)
BUN: 28 mg/dL — ABNORMAL HIGH (ref 8–27)
CO2: 25 mmol/L (ref 20–29)
Calcium: 9.3 mg/dL (ref 8.6–10.2)
Chloride: 94 mmol/L — ABNORMAL LOW (ref 96–106)
Creatinine, Ser: 1.73 mg/dL — ABNORMAL HIGH (ref 0.76–1.27)
Glucose: 116 mg/dL — ABNORMAL HIGH (ref 70–99)
Potassium: 3.4 mmol/L — ABNORMAL LOW (ref 3.5–5.2)
Sodium: 139 mmol/L (ref 134–144)
eGFR: 38 mL/min/1.73 — ABNORMAL LOW (ref >59)

## 2024-10-15 ENCOUNTER — Encounter: Payer: Self-pay | Admitting: Podiatry

## 2024-10-15 ENCOUNTER — Ambulatory Visit

## 2024-10-15 ENCOUNTER — Ambulatory Visit: Admitting: Podiatry

## 2024-10-15 DIAGNOSIS — L97512 Non-pressure chronic ulcer of other part of right foot with fat layer exposed: Secondary | ICD-10-CM

## 2024-10-15 NOTE — Progress Notes (Signed)
 Subjective:  Patient ID: [name]., male    DOB: 04-07-1933,  MRN: [id number]  Chief Complaint  Patient presents with   [facility]    Rm1 Routine foot care/ Dr. Reyes Costa last visit October 2025/ wound right hallux pt sees Dr. Tobie for treatment.    89 y.o. male presents with the above complaint.  Patient presents with complaint of right great toe superficial ulceration with fat layer exposed.  He states that started coming back again denies any other acute complaints.  Would like to discuss next treatment plan  Review of Systems: Negative except as noted in the HPI. Denies N/V/F/Ch.  Past Medical History:  Diagnosis Date   Acute embolic stroke (HCC) 02/01/2017   BPH (benign prostatic hyperplasia)    Bradycardia 11/10/2015   Coronary artery disease    Dizziness 08/21/2012   DYSPEPSIA 07/31/2010   Qualifier: Diagnosis of  By: Sebastian RN, Rosina     Hyperlipidemia 05/16/2010   Qualifier: Diagnosis of  By: Perla MD, [name]      Hypertension    HYPERTENSION, BENIGN 05/16/2010   Qualifier: Diagnosis of  By: Perla MD, [name]     Stenosis of right carotid artery 02/01/2017    Current Outpatient Medications:    acetaminophen  (TYLENOL ) 650 MG CR tablet, Take 650 mg by mouth every 8 (eight) hours as needed. , Disp: , Rfl:    amiodarone (PACERONE) 200 MG tablet, Take 200 mg by mouth daily., Disp: , Rfl:    apixaban  (ELIQUIS ) 2.5 MG TABS tablet, Take 1 tablet (2.5 mg total) by mouth 2 (two) times daily., Disp: 180 tablet, Rfl: 3   Ascorbic Acid (VITAMIN C ) 1000 MG tablet, Take 1,000 mg by mouth daily., Disp: , Rfl:    atorvastatin  (LIPITOR) 20 MG tablet, Take 20 mg by mouth at bedtime., Disp: , Rfl:    b complex vitamins tablet, Take 1 tablet by mouth daily., Disp: , Rfl:    cholecalciferol  (VITAMIN D ) 1000 units tablet, Take 2,000 Units by mouth daily., Disp: , Rfl:    clopidogrel  (PLAVIX ) 75 MG tablet, Take 1 tablet (75 mg total) by mouth daily., Disp: 90 tablet, Rfl: 0    cyanocobalamin  (VITAMIN B12) 1000 MCG tablet, Take 1,000 mcg by mouth daily., Disp: , Rfl:    docusate sodium  (COLACE) 100 MG capsule, Take 100 mg by mouth 2 (two) times daily., Disp: , Rfl:    empagliflozin (JARDIANCE) 25 MG TABS tablet, Take 12.5 mg by mouth., Disp: , Rfl:    ezetimibe  (ZETIA ) 10 MG tablet, Take 1 tablet (10 mg total) by mouth daily., Disp: 90 tablet, Rfl: 3   FLAXSEED, LINSEED, PO, Take 2 g by mouth daily., Disp: , Rfl:    gentamicin  cream (GARAMYCIN ) 0.1 %, Apply to affected toe once daily., Disp: 30 g, Rfl: 1   metoprolol  succinate (TOPROL -XL) 25 MG 24 hr tablet, Take 12.5 mg by mouth daily., Disp: , Rfl:    nitroGLYCERIN  (NITROSTAT ) 0.4 MG SL tablet, Place 1 tablet (0.4 mg total) under the tongue every 5 (five) minutes x 3 doses as needed for chest pain., Disp: 25 tablet, Rfl: 0   pantoprazole  (PROTONIX ) 40 MG tablet, Take 1 tablet (40 mg total) by mouth 2 (two) times daily., Disp: 60 tablet, Rfl: 1   potassium chloride  (KLOR-CON  M) 20 MEQ tablet, Take 1 tablet (20 mEq total) by mouth daily., Disp: 90 tablet, Rfl: 3   pregabalin  (LYRICA ) 150 MG capsule, Take 150 mg by mouth at bedtime., Disp: ,  Rfl:    Probiotic Product (UP4 PROBIOTICS PO), Take 7.5 mg by mouth., Disp: , Rfl:    Psyllium (GERI-MUCIL) 25 % POWD, Take by mouth., Disp: , Rfl:    ranolazine  (RANEXA ) 500 MG 12 hr tablet, Take 1 tablet (500 mg total) by mouth 2 (two) times daily., Disp: 180 tablet, Rfl: 3   silver  sulfADIAZINE  (SILVADENE ) 1 % cream, Apply 1 Application topically daily., Disp: 125 each, Rfl: 0   tamsulosin  (FLOMAX ) 0.4 MG CAPS capsule, Take 2 capsules (0.8 mg total) by mouth daily., Disp: 30 capsule, Rfl: 0   torsemide  40 MG TABS, Take 40 mg by mouth 2 (two) times daily., Disp: 180 tablet, Rfl: 3  Social History   Tobacco Use  Smoking Status Never  Smokeless Tobacco Never    Allergies  Allergen Reactions   Penicillins Dermatitis and Rash    Rash   Gabapentin    Penicillin G    Codeine  Nausea Only and Nausea And Vomiting    Other reaction(s): Vomiting Other reaction(s): Vomiting   Objective:  There were no vitals filed for this visit. There is no height or weight on file to calculate BMI. Constitutional Well developed. Well nourished.  Vascular Dorsalis pedis pulses palpable bilaterally. Posterior tibial pulses palpable bilaterally. Capillary refill normal to all digits.  No cyanosis or clubbing noted. Pedal hair growth normal.  Neurologic Normal speech. Oriented to person, place, and time. Epicritic sensation to light touch grossly present bilaterally.  Dermatologic Right hallux superficial ulceration with fat layer exposed.  Does not probe down to deep tissue.  No purulent drainage noted no malodor present no erythema noted  Orthopedic: Normal joint ROM without pain or crepitus bilaterally. No visible deformities. No bony tenderness.   Radiographs: 3 views of skeletally mature the right foot: No gross destruction of hallux bone noted.  Possible well pathologic fracture noted.  Will continue to clinically monitor. Assessment:   1. Ulcer of great toe, right, with fat layer exposed (HCC)     Plan:  Patient was evaluated and treated and all questions answered.  Right hallux ulcer fat layer exposed - All questions or concerns were discussed with the patient in extensive detail - Given the amount of ulceration patient discussed with the patient to go back to Betadine wet-to-dry dressing changes.  For now hold off on waiting cream he states understanding - Discussed shoe gear modification - Patient is at high risk of undergoing hallux amputation if the wound regresses.  For now it is not probing down to bone and will continue local wound care for now  No follow-ups on file.

## 2024-10-19 NOTE — Progress Notes (Unsigned)
 Cardiology Office Note  Date:  10/20/2024   ID:  [name]., DOB 09/06/1933, MRN [id number]  PCP:  Auston Reyes BIRCH, MD   Chief Complaint  Patient presents with   Follow-up    Patient here for follow up from cardioversion. Patient with some shortness of breath but improved since Cardioversion. Patient c/o some mild shortness of breath with some bilateral LE edema.     HPI:  [name] is a 89 year-old gentleman with a history of  coronary artery disease,   stent placed to his proximal RCA in September 2007, taxis stent 3.0 x 24 mm, previous syncopal episode (Etiology of his spell is uncertain),  50% carotid disease on the right CVA 2018, History of peripheral vision issues  EF 35 to 40% aortic valve stenosis, s/p TAVR temp perm placement for CHB immediately following the procedure, but did not require permanent pacemaker  bleeding ulcer in 2024 Feb with 2 units of transfusion  who presents for routine followup of his coronary artery disease,  Tremor, and stroke, PAF, s/p TAVR 11/24  Last seen by myself in clinic August 31, 2024 presents with his wife  Underwent cardioversion at [facility] October 07, 2024 Weight down from 177 to 172 since his last clinic visit in September Weight at home 169 Leg swelling much improved, he reports he continues to have abdominal distention  Echo done at [facility] October 12, 2024 with EF 40%, moderately elevated right heart pressures estimated greater than 50 mmHg  He is taking torsemide  20 grams twice daily with potassium 10 twice daily Potassium  chronically low so potassium was increased from 10 daily up to 20 daily 2 weeks ago  EKG today maintaining normal sinus rhythm, was asymptomatic from his atrial fibrillation Remains in left bundle branch block  Lab work reviewed CR 1.9  EKG personally reviewed by myself on todays visit EKG Interpretation Date/Time:  Tuesday October 20 2024 11:02:17 EST Ventricular Rate:  59 PR  Interval:    QRS Duration:  174 QT Interval:  512 QTC Calculation: 506 R Axis:   -60  Text Interpretation: Normal sinus rhythm with PACs Left axis deviation Left bundle branch block Minimal voltage criteria for LVH, may be normal variant ( Cornell product ) Lateral infarct , age undetermined Inferior infarct (cited on or before 20-Oct-2024) When compared with ECG of 30-Sep-2024 15:03, rhyth has changed Lateral infarct is now Present Confirmed by [name] [id number] on 10/20/2024 11:19:23 AM   Two-week holter monitor November 2024 after TAVR discharge demonstrated predominantly sinus rhythm with HR range 41-127, 3 episodes NSVT, ventricular ectopy burden2%, 3% afib burden with no RVR.   ABI and LE arterial doppler normal August 2024   hospital non-STEMI June 2024 troponin 1300 Cardiac catheterization June 15, 2023 Severe multivessel coronary disease with unclear culprit Moderately reduced ejection fraction Transfer to [facility] Not a candidate for CABG Left circumflex felt to be subtotal, very small in caliber, diffuse moderate disease of mid LAD and large diagonal with heavy calcification  RCA with critical ostial and mid vessel disease with stents between with moderate in-stent restenosis, entire RCA severely calcified   Echo June 16, 2023 EF 35 to 40% Moderate aortic valve stenosis mean gradient 16 mmHg, peak gradient 25, estimated aortic valve area 0.8, Low-flow, low gradient  Seen by [facility] cardiology July 12, 2023 Statin held, spironolactone 12.53 days a week, Jardiance 10, Imdur  held, Lasix  as needed  On further follow-up losartan  added but caused orthostasis symptoms  Echo  at [facility] September 05, 2023 Aortic valve stenosis mean gradient 21 mmHg peak gradient 35 Moderate MR EF essentially unchanged 35%   TAVR end of November 2024  hospital with chest pain May 11, 2023 Troponin 1324 Cardiac catheterization  June 15, 2023 Severe multivessel coronary disease with unclear  culprit Ostial-proximal LCx subtotal 99 to 100% occlusion with <TIMI I flow Mild diffuse proximal LAD disease with 70% stenosis after short mid vessel diagonal branch, the large Ramus-like first diagonal with focal 50% stenosis Heavily calcified ostial RCA (very difficult to engage) 95% stenosis followed by diffuse 40 to 60% ISR of the proximal to mid vessel stent followed by 99 % subtotal occlusion after the stent crossing major RV marginal branch. Brisk collaterals fill the PDA  Moderate reduced EF with basal to mid inferior essentially akinesis and inferolateral hypokinesis.  Transfer to [facility] Chest pain-free at [facility] Left circumflex felt to be subtotal, very small in caliber, diffuse moderate disease of mid LAD and large diagonal with heavy calcification RCA with critical ostial and mid vessel disease with stents between with moderate in-stent restenosis, entire RCA severely calcified CABG not felt to be a good candidate Eliquis , Plavix , isosorbide , Ranexa   Zio monitor from April 2024 Frequent episodes of tachycardia, frequent PACs 408 Supraventricular Tachycardia runs occurred, the run with the fastest interval lasting 5 beats with a max rate of 146 bpm, the longest lasting 25.4 secs with an avg rate of 110 bpm.  History of bradycardia  history of left parieto-occipital hemorrhagic stroke  seen by ophthamologist 3/22 who did HVF test which showed right inf>sup quandrantopsia. felt this was a new finding [name] denied any new visual field deficits.  He has been driving Seen in the Er 6/84/7977 Head MRI: nothing acute  01/16/2020,  ED for syncope 3 alcoholic beverages that night, which was more than his usual  woke around 2 AM, and after entering the kitchen, had a syncopal episode  --received his second COVID-19 vaccination 4 days prior. usually spends some time on the edge of the bed and seated before standing, in order to prevent dizziness  daughter reports the fall / LOC woke her  from sleep She found him slumped over on the floor.  he was cold to the touch and unable to speak  blood pressure was low.  EMS took his BP when they arrived and noted it was low.  MRA was without acute abnormality and showed mild chronic small vessel ischemic dz and moderate cerebral atrophy. chronic hemorrhagic L temporoparietal infarct.     fall while in [place] 2019 Was watching a football game, stood up and had a mechanical fall tripped over some shoes 10/24/2018 Suffered a hip fracture , was hospitalized  Following the surgery the physicians increased his lisinopril  up to 20 mg daily and placed him on aspirin  325 mg daily He has started to have some upset stomach, wonders if it could be the high-dose aspirin   February 2018 he had acute vision changes, word finding difficulty. He was taken by life flight to [facility] hospital MRI/MRA showing acute stroke Details of scan as below: 1. Acute left parieto-occipital intraparenchymal hematoma with adjacent subarachnoid hemorrhage. This most likely represents hemorrhagic transformation from an embolic infarct given its configuration. No underlying mass. No other findings to suggest amyloid angiopathy. 2. Normal MRA of the head.  He had a CT scan of the neck showing 50% stenosis on the left, 10% on the right  PMH:   has a past medical history  of Acute embolic stroke (HCC) (02/01/2017), BPH (benign prostatic hyperplasia), Bradycardia (11/10/2015), Coronary artery disease, Dizziness (08/21/2012), DYSPEPSIA (07/31/2010), Hyperlipidemia (05/16/2010), Hypertension, HYPERTENSION, BENIGN (05/16/2010), and Stenosis of right carotid artery (02/01/2017).  PSH:    Past Surgical History:  Procedure Laterality Date   ESOPHAGOGASTRODUODENOSCOPY (EGD) WITH PROPOFOL  N/A 01/08/2023   Procedure: ESOPHAGOGASTRODUODENOSCOPY (EGD) WITH PROPOFOL ;  Surgeon: Jinny Carmine, MD;  Location: [facility] ENDOSCOPY;  Service: Endoscopy;  Laterality: N/A;   HIP SURGERY     right hip     Intestinal Blockage     LEFT HEART CATH AND CORONARY ANGIOGRAPHY N/A 06/15/2023   Procedure: LEFT HEART CATH AND CORONARY ANGIOGRAPHY;  Surgeon: Anner Alm ORN, MD;  Location: [facility] INVASIVE CV LAB;  Service: Cardiovascular;  Laterality: N/A;   LOOP RECORDER INSERTION N/A 05/14/2017   Procedure: Loop Recorder Insertion;  Surgeon: Fernande Elspeth BROCKS, MD;  Location: [facility] INVASIVE CV LAB;  Service: Cardiovascular;  Laterality: N/A;   SKIN CANCER EXCISION     x2   TONSILLECTOMY      Current Outpatient Medications  Medication Sig Dispense Refill   acetaminophen  (TYLENOL ) 650 MG CR tablet Take 650 mg by mouth every 8 (eight) hours as needed.      amiodarone (PACERONE) 200 MG tablet Take 200 mg by mouth daily.     apixaban  (ELIQUIS ) 2.5 MG TABS tablet Take 1 tablet (2.5 mg total) by mouth 2 (two) times daily. 180 tablet 3   Ascorbic Acid (VITAMIN C ) 1000 MG tablet Take 1,000 mg by mouth daily.     atorvastatin  (LIPITOR) 20 MG tablet Take 20 mg by mouth at bedtime.     b complex vitamins tablet Take 1 tablet by mouth daily.     cholecalciferol  (VITAMIN D ) 1000 units tablet Take 2,000 Units by mouth daily.     clopidogrel  (PLAVIX ) 75 MG tablet Take 1 tablet (75 mg total) by mouth daily. 90 tablet 0   cyanocobalamin  (VITAMIN B12) 1000 MCG tablet Take 1,000 mcg by mouth daily.     docusate sodium  (COLACE) 100 MG capsule Take 100 mg by mouth 2 (two) times daily.     empagliflozin (JARDIANCE) 25 MG TABS tablet Take 12.5 mg by mouth.     ezetimibe  (ZETIA ) 10 MG tablet Take 1 tablet (10 mg total) by mouth daily. 90 tablet 3   FLAXSEED, LINSEED, PO Take 2 g by mouth daily.     gentamicin  cream (GARAMYCIN ) 0.1 % Apply to affected toe once daily. 30 g 1   metoprolol  succinate (TOPROL -XL) 25 MG 24 hr tablet Take 12.5 mg by mouth daily.     nitroGLYCERIN  (NITROSTAT ) 0.4 MG SL tablet Place 1 tablet (0.4 mg total) under the tongue every 5 (five) minutes x 3 doses as needed for chest pain. 25 tablet 0    pantoprazole  (PROTONIX ) 40 MG tablet Take 1 tablet (40 mg total) by mouth 2 (two) times daily. (Patient taking differently: Take 40 mg by mouth daily.) 60 tablet 1   pregabalin  (LYRICA ) 150 MG capsule Take 150 mg by mouth at bedtime. (Patient taking differently: Take 100 mg by mouth at bedtime.)     Probiotic Product (UP4 PROBIOTICS PO) Take 7.5 mg by mouth.     Psyllium (GERI-MUCIL) 25 % POWD Take by mouth.     ranolazine  (RANEXA ) 500 MG 12 hr tablet Take 1 tablet (500 mg total) by mouth 2 (two) times daily. 180 tablet 3   silver  sulfADIAZINE  (SILVADENE ) 1 % cream Apply 1 Application topically daily. 125 each 0  tamsulosin  (FLOMAX ) 0.4 MG CAPS capsule Take 2 capsules (0.8 mg total) by mouth daily. 30 capsule 0   potassium chloride  (KLOR-CON  M) 10 MEQ tablet Take 1 tablet (10 mEq total) by mouth daily. May take up to three times daily as needed with extra Torsemide . 240 tablet 3   torsemide  (DEMADEX ) 20 MG tablet Take 1 tablet (20 mg total) by mouth daily. May take up to three times daily as needed for weight gain and swelling. 240 tablet 3   No current facility-administered medications for this visit.    Allergies:   Penicillins, Gabapentin, Penicillin g, and Codeine   Social History:  The patient  reports that he has never smoked. He has never used smokeless tobacco. He reports that he does not currently use alcohol. He reports that he does not use drugs.   Family History:   family history includes Cancer in an other family member; Hyperlipidemia in an other family member; Hypertension in his mother and another family member; Other in an other family member.    Review of Systems: Review of Systems  Constitutional: Negative.   HENT: Negative.    Respiratory: Negative.    Cardiovascular: Negative.   Gastrointestinal: Negative.   Musculoskeletal:  Positive for joint pain.  Neurological:  Positive for tremors.  Psychiatric/Behavioral:  Positive for memory loss.   All other systems  reviewed and are negative.   PHYSICAL EXAM: VS:  BP 110/60 (BP Location: Left Arm, Patient Position: Sitting, Cuff Size: Normal)   Pulse (!) 59   Ht 5' 8 (1.727 m)   Wt 172 lb 8 oz (78.2 kg)   SpO2 96%   BMI 26.23 kg/m  , BMI Body mass index is 26.23 kg/m. Constitutional:  oriented to person, place, and time. No distress.  HENT:  Head: Normocephalic and atraumatic.  Eyes:  no discharge. No scleral icterus.  Neck: Normal range of motion. Neck supple. No JVD present.  Cardiovascular: Normal rate, regular rhythm, normal heart sounds and intact distal pulses. Exam reveals no gallop and no friction rub.  Trace lower extremity edema No murmur heard. Pulmonary/Chest: Effort normal and breath sounds normal. No stridor. No respiratory distress.  no wheezes.  no rales.  no tenderness.  Abdominal: Soft.  no distension.  no tenderness.  Musculoskeletal: Normal range of motion.  no  tenderness or deformity.  Neurological:  normal muscle tone. Coordination normal. No atrophy Skin: Skin is warm and dry. No rash noted. not diaphoretic.  Psychiatric:  normal mood and affect. behavior is normal. Thought content normal.     Recent Labs: 06/28/2024: ALT 16; Hemoglobin 12.7; Platelets 123 09/30/2024: BUN 28; Creatinine, Ser 1.73; Potassium 3.4; Sodium 139    Lipid Panel Lab Results  Component Value Date   CHOL 90 06/16/2023   HDL 40 (L) 06/16/2023   LDLCALC 44 06/16/2023   TRIG 30 06/16/2023    Wt Readings from Last 3 Encounters:  10/20/24 172 lb 8 oz (78.2 kg)  09/30/24 174 lb (78.9 kg)  08/31/24 177 lb 2 oz (80.3 kg)     ASSESSMENT AND PLAN:  Atrial fibrillation/atrial flutter Recent cardioversion October 07, 2024- -maintaining normal sinus rhythm with PACs rate in the high 50s, left bundle Continue Eliquis  2.5 twice daily, given age over 74 creatinine over 1.5  Leg swelling Weight trending down now 169 at home, 172 in office today Recommend we follow diuretic sliding scale for  now --weight <167 , torsemide  20 daily potassium 10 --Weight 168-173, torsemide  20 and potassium  10  twice a day --Weight >174, torsemide  20 mg three a day with potassium 3 a day - BMP pending today  Aortic valve stenosis Severe stenosis low gradient, status post TAVR  Hyperlipidemia, unspecified hyperlipidemia type - Continue Lipitor 20 mg daily  HYPERTENSION, BENIGN -  Blood pressure is well controlled on today's visit. No changes made to the medications.  Atherosclerosis of native coronary artery of native heart with chronic stable angina pectoris - Severe three-vessel coronary disease not amenable to intervention Was seen by interventional at [facility] Ranexa , metoprolol   Imdur  held by team at [facility] Currently with no symptoms of angina. No further workup at this time. Continue current medication regimen.  syncope, unspecified syncope type - Plan: EKG 12-Lead Remote syncope No recent episodes of near syncope or syncope  Acute embolic stroke (HCC) Continue Eliquis  2.5 twice daily, Plavix  75 daily  Stenosis of right carotid artery Nonobstructive disease, aggressive lipid management  Essential tremor Followed by neurology  Cardiomyopathy Ejection fraction 40 percent and normal sinus rhythm EF 30 to 35% and atrial fibrillation previously - Moderately elevated right heart pressures 54 on echo November 10 Continue metoprolol  succinate, Jardiance ARB on hold secondary to low blood pressure,  Continue torsemide  as above  Orders Placed This Encounter  Procedures   Comprehensive metabolic panel with GFR   EKG [id number]-Ozji     Signed, Velinda Lunger, M.D., Ph.D. 10/20/2024  [facility] [place], [place] [phone]

## 2024-10-20 ENCOUNTER — Ambulatory Visit: Admitting: Cardiovascular Disease

## 2024-10-20 ENCOUNTER — Ambulatory Visit: Attending: Cardiovascular Disease | Admitting: Cardiovascular Disease

## 2024-10-20 ENCOUNTER — Encounter: Payer: Self-pay | Admitting: Cardiovascular Disease

## 2024-10-20 VITALS — BP 110/60 | HR 59 | Ht 68.0 in | Wt 172.5 lb

## 2024-10-20 DIAGNOSIS — I5023 Acute on chronic systolic (congestive) heart failure: Secondary | ICD-10-CM | POA: Diagnosis present

## 2024-10-20 DIAGNOSIS — I495 Sick sinus syndrome: Secondary | ICD-10-CM | POA: Insufficient documentation

## 2024-10-20 DIAGNOSIS — E785 Hyperlipidemia, unspecified: Secondary | ICD-10-CM | POA: Insufficient documentation

## 2024-10-20 DIAGNOSIS — I5020 Unspecified systolic (congestive) heart failure: Secondary | ICD-10-CM | POA: Diagnosis present

## 2024-10-20 DIAGNOSIS — I350 Nonrheumatic aortic (valve) stenosis: Secondary | ICD-10-CM | POA: Diagnosis present

## 2024-10-20 DIAGNOSIS — I255 Ischemic cardiomyopathy: Secondary | ICD-10-CM | POA: Insufficient documentation

## 2024-10-20 DIAGNOSIS — I10 Essential (primary) hypertension: Secondary | ICD-10-CM | POA: Insufficient documentation

## 2024-10-20 DIAGNOSIS — I4819 Other persistent atrial fibrillation: Secondary | ICD-10-CM | POA: Diagnosis not present

## 2024-10-20 DIAGNOSIS — I471 Supraventricular tachycardia, unspecified: Secondary | ICD-10-CM | POA: Insufficient documentation

## 2024-10-20 DIAGNOSIS — Z79899 Other long term (current) drug therapy: Secondary | ICD-10-CM | POA: Diagnosis present

## 2024-10-20 DIAGNOSIS — I2510 Atherosclerotic heart disease of native coronary artery without angina pectoris: Secondary | ICD-10-CM | POA: Insufficient documentation

## 2024-10-20 DIAGNOSIS — Z952 Presence of prosthetic heart valve: Secondary | ICD-10-CM | POA: Diagnosis present

## 2024-10-20 MED ORDER — TORSEMIDE 20 MG PO TABS: 20.0000 mg | ORAL_TABLET | Freq: Every day | ORAL | 3 refills | Status: DC

## 2024-10-20 MED ORDER — POTASSIUM CHLORIDE CRYS ER 10 MEQ PO TBCR: 10.0000 meq | EXTENDED_RELEASE_TABLET | Freq: Every day | ORAL | 3 refills | Status: DC

## 2024-10-20 NOTE — Patient Instructions (Addendum)
 Medication Instructions:  No changes  weight <167 , torsemide  20 daily potassium 10  Weight 168-173, torsemide  20 and potassium 10  twice a day  Weight >174, torsemide  20 mg three a day with potassium 3 a day  If you need a refill on your cardiac medications before your next appointment, please call your pharmacy.   Lab work: CMP today  Testing/Procedures: No new testing needed  Follow-Up: At [facility], you and your health needs are our priority.  As part of our continuing mission to provide you with exceptional heart care, we have created designated Provider Care Teams.  These Care Teams include your primary Cardiologist (physician) and Advanced Practice Providers (APPs -  Physician Assistants and Nurse Practitioners) who all work together to provide you with the care you need, when you need it.  You will need a follow up appointment in 3 months  Providers on your designated Care Team:   Lonni Meager, NP Bernardino Bring, PA-C Cadence [place], [place]  COVID-19 Vaccine Information can be found at: [email or web address] For questions related to vaccine distribution or appointments, please email [email or web address] or call [phone].

## 2024-10-21 LAB — COMPREHENSIVE METABOLIC PANEL WITH GFR
ALT: 16 IU/L (ref 0–44)
AST: 26 IU/L (ref 0–40)
Albumin: 4.5 g/dL (ref 3.7–4.7)
Alkaline Phosphatase: 164 IU/L — ABNORMAL HIGH (ref 48–129)
BUN/Creatinine Ratio: 15 (ref 10–24)
BUN: 28 mg/dL — ABNORMAL HIGH (ref 8–27)
Bilirubin Total: 1.4 mg/dL — ABNORMAL HIGH (ref 0.0–1.2)
CO2: 27 mmol/L (ref 20–29)
Calcium: 9.1 mg/dL (ref 8.6–10.2)
Chloride: 94 mmol/L — ABNORMAL LOW (ref 96–106)
Creatinine, Ser: 1.89 mg/dL — ABNORMAL HIGH (ref 0.76–1.27)
Globulin, Total: 2.4 g/dL (ref 1.5–4.5)
Glucose: 102 mg/dL — ABNORMAL HIGH (ref 70–99)
Potassium: 3.5 mmol/L (ref 3.5–5.2)
Sodium: 138 mmol/L (ref 134–144)
Total Protein: 6.9 g/dL (ref 6.0–8.5)
eGFR: 34 mL/min/1.73 — ABNORMAL LOW (ref >59)

## 2024-10-22 ENCOUNTER — Ambulatory Visit (INDEPENDENT_AMBULATORY_CARE_PROVIDER_SITE_OTHER): Admitting: Podiatry

## 2024-10-22 DIAGNOSIS — L97512 Non-pressure chronic ulcer of other part of right foot with fat layer exposed: Secondary | ICD-10-CM

## 2024-10-22 NOTE — Progress Notes (Signed)
 Subjective:  Patient ID: Calvin Jimmy Armand Mickey., male    DOB: [birth year],  MRN: [id number]  Chief Complaint  Patient presents with   Foot Ulcer    Right great toe ulcer    89 y.o. male presents with the above complaint.  Patient presents with complaint of right great toe superficial ulceration with fat layer exposed.  He states that started coming back again denies any other acute complaints.  Would like to discuss next treatment plan  Review of Systems: Negative except as noted in the HPI. Denies N/V/F/Ch.  Past Medical History:  Diagnosis Date   Acute embolic stroke (HCC) 02/01/2017   BPH (benign prostatic hyperplasia)    Bradycardia 11/10/2015   Coronary artery disease    Dizziness 08/21/2012   DYSPEPSIA 07/31/2010   Qualifier: Diagnosis of  By: Sebastian RN, Rosina     Hyperlipidemia 05/16/2010   Qualifier: Diagnosis of  By: Perla MD, [name]      Hypertension    HYPERTENSION, BENIGN 05/16/2010   Qualifier: Diagnosis of  By: Perla MD, [name]     Stenosis of right carotid artery 02/01/2017    Current Outpatient Medications:    acetaminophen  (TYLENOL ) 650 MG CR tablet, Take 650 mg by mouth every 8 (eight) hours as needed. , Disp: , Rfl:    amiodarone (PACERONE) 200 MG tablet, Take 200 mg by mouth daily., Disp: , Rfl:    apixaban  (ELIQUIS ) 2.5 MG TABS tablet, Take 1 tablet (2.5 mg total) by mouth 2 (two) times daily., Disp: 180 tablet, Rfl: 3   Ascorbic Acid (VITAMIN C ) 1000 MG tablet, Take 1,000 mg by mouth daily., Disp: , Rfl:    atorvastatin  (LIPITOR) 20 MG tablet, Take 20 mg by mouth at bedtime., Disp: , Rfl:    b complex vitamins tablet, Take 1 tablet by mouth daily., Disp: , Rfl:    cholecalciferol  (VITAMIN D ) 1000 units tablet, Take 2,000 Units by mouth daily., Disp: , Rfl:    clopidogrel  (PLAVIX ) 75 MG tablet, Take 1 tablet (75 mg total) by mouth daily., Disp: 90 tablet, Rfl: 0   cyanocobalamin  (VITAMIN B12) 1000 MCG tablet, Take 1,000 mcg by mouth daily., Disp: , Rfl:     docusate sodium  (COLACE) 100 MG capsule, Take 100 mg by mouth 2 (two) times daily., Disp: , Rfl:    empagliflozin (JARDIANCE) 25 MG TABS tablet, Take 12.5 mg by mouth., Disp: , Rfl:    ezetimibe  (ZETIA ) 10 MG tablet, Take 1 tablet (10 mg total) by mouth daily., Disp: 90 tablet, Rfl: 3   FLAXSEED, LINSEED, PO, Take 2 g by mouth daily., Disp: , Rfl:    gentamicin  cream (GARAMYCIN ) 0.1 %, Apply to affected toe once daily., Disp: 30 g, Rfl: 1   metoprolol  succinate (TOPROL -XL) 25 MG 24 hr tablet, Take 12.5 mg by mouth daily., Disp: , Rfl:    nitroGLYCERIN  (NITROSTAT ) 0.4 MG SL tablet, Place 1 tablet (0.4 mg total) under the tongue every 5 (five) minutes x 3 doses as needed for chest pain., Disp: 25 tablet, Rfl: 0   pantoprazole  (PROTONIX ) 40 MG tablet, Take 1 tablet (40 mg total) by mouth 2 (two) times daily. (Patient taking differently: Take 40 mg by mouth daily.), Disp: 60 tablet, Rfl: 1   potassium chloride  (KLOR-CON  M) 10 MEQ tablet, Take 1 tablet (10 mEq total) by mouth daily. May take up to three times daily as needed with extra Torsemide ., Disp: 240 tablet, Rfl: 3   pregabalin  (LYRICA ) 150 MG capsule, Take 150 mg  by mouth at bedtime. (Patient taking differently: Take 100 mg by mouth at bedtime.), Disp: , Rfl:    Probiotic Product (UP4 PROBIOTICS PO), Take 7.5 mg by mouth., Disp: , Rfl:    Psyllium (GERI-MUCIL) 25 % POWD, Take by mouth., Disp: , Rfl:    ranolazine  (RANEXA ) 500 MG 12 hr tablet, Take 1 tablet (500 mg total) by mouth 2 (two) times daily., Disp: 180 tablet, Rfl: 3   silver  sulfADIAZINE  (SILVADENE ) 1 % cream, Apply 1 Application topically daily., Disp: 125 each, Rfl: 0   tamsulosin  (FLOMAX ) 0.4 MG CAPS capsule, Take 2 capsules (0.8 mg total) by mouth daily., Disp: 30 capsule, Rfl: 0   torsemide  (DEMADEX ) 20 MG tablet, Take 1 tablet (20 mg total) by mouth daily. May take up to three times daily as needed for weight gain and swelling., Disp: 240 tablet, Rfl: 3  Social History   Tobacco  Use  Smoking Status Never  Smokeless Tobacco Never    Allergies  Allergen Reactions   Penicillins Dermatitis and Rash    Rash   Gabapentin    Penicillin G    Codeine Nausea Only and Nausea And Vomiting    Other reaction(s): Vomiting Other reaction(s): Vomiting   Objective:  There were no vitals filed for this visit. There is no height or weight on file to calculate BMI. Constitutional Well developed. Well nourished.  Vascular Dorsalis pedis pulses palpable bilaterally. Posterior tibial pulses palpable bilaterally. Capillary refill normal to all digits.  No cyanosis or clubbing noted. Pedal hair growth normal.  Neurologic Normal speech. Oriented to person, place, and time. Epicritic sensation to light touch grossly present bilaterally.  Dermatologic Right hallux superficial ulceration with fat layer exposed.  Does not probe down to deep tissue.  No purulent drainage noted no malodor present no erythema noted  Orthopedic: Normal joint ROM without pain or crepitus bilaterally. No visible deformities. No bony tenderness.   Radiographs: 3 views of skeletally mature the right foot: No gross destruction of hallux bone noted.  Possible well pathologic fracture noted.  Will continue to clinically monitor. Assessment:   No diagnosis found.   Plan:  Patient was evaluated and treated and all questions answered.  Right hallux ulcer fat layer exposed - All questions or concerns were discussed with the patient in extensive detail - Given the amount of ulceration patient discussed with the patient to go back to Betadine wet-to-dry dressing changes.  For now hold off on waiting cream he states understanding - Discussed shoe gear modification -Wound measurement is measuring about 2.5 cm x 0.5 cm x 0.3 cm - Patient is at high risk of undergoing hallux amputation if the wound regresses.  For now it is not probing down to bone and will continue local wound care for now - If there is no  improvement we will discuss skin graft options in the future  No follow-ups on file.

## 2024-10-23 ENCOUNTER — Ambulatory Visit: Payer: Self-pay | Admitting: Cardiovascular Disease

## 2024-11-04 ENCOUNTER — Emergency Department

## 2024-11-04 ENCOUNTER — Inpatient Hospital Stay
Admission: EM | Admit: 2024-11-04 | Discharge: 2024-12-03 | DRG: 872 | Disposition: E | Attending: Internal Medicine | Admitting: Internal Medicine

## 2024-11-04 ENCOUNTER — Inpatient Hospital Stay

## 2024-11-04 ENCOUNTER — Other Ambulatory Visit: Payer: Self-pay

## 2024-11-04 DIAGNOSIS — I10 Essential (primary) hypertension: Secondary | ICD-10-CM | POA: Diagnosis present

## 2024-11-04 DIAGNOSIS — Z886 Allergy status to analgesic agent status: Secondary | ICD-10-CM

## 2024-11-04 DIAGNOSIS — N1832 Chronic kidney disease, stage 3b: Secondary | ICD-10-CM | POA: Diagnosis present

## 2024-11-04 DIAGNOSIS — I469 Cardiac arrest, cause unspecified: Secondary | ICD-10-CM | POA: Diagnosis not present

## 2024-11-04 DIAGNOSIS — E785 Hyperlipidemia, unspecified: Secondary | ICD-10-CM | POA: Diagnosis present

## 2024-11-04 DIAGNOSIS — G9340 Encephalopathy, unspecified: Secondary | ICD-10-CM | POA: Diagnosis present

## 2024-11-04 DIAGNOSIS — I2510 Atherosclerotic heart disease of native coronary artery without angina pectoris: Secondary | ICD-10-CM | POA: Diagnosis present

## 2024-11-04 DIAGNOSIS — R6520 Severe sepsis without septic shock: Secondary | ICD-10-CM

## 2024-11-04 DIAGNOSIS — Z7901 Long term (current) use of anticoagulants: Secondary | ICD-10-CM | POA: Diagnosis not present

## 2024-11-04 DIAGNOSIS — N400 Enlarged prostate without lower urinary tract symptoms: Secondary | ICD-10-CM | POA: Diagnosis present

## 2024-11-04 DIAGNOSIS — F0390 Unspecified dementia without behavioral disturbance: Secondary | ICD-10-CM | POA: Diagnosis present

## 2024-11-04 DIAGNOSIS — R4182 Altered mental status, unspecified: Secondary | ICD-10-CM | POA: Diagnosis present

## 2024-11-04 DIAGNOSIS — Z7902 Long term (current) use of antithrombotics/antiplatelets: Secondary | ICD-10-CM | POA: Diagnosis not present

## 2024-11-04 DIAGNOSIS — I959 Hypotension, unspecified: Secondary | ICD-10-CM | POA: Diagnosis present

## 2024-11-04 DIAGNOSIS — Z8673 Personal history of transient ischemic attack (TIA), and cerebral infarction without residual deficits: Secondary | ICD-10-CM | POA: Diagnosis not present

## 2024-11-04 DIAGNOSIS — Z7984 Long term (current) use of oral hypoglycemic drugs: Secondary | ICD-10-CM | POA: Diagnosis not present

## 2024-11-04 DIAGNOSIS — Z885 Allergy status to narcotic agent status: Secondary | ICD-10-CM | POA: Diagnosis not present

## 2024-11-04 DIAGNOSIS — W19XXXA Unspecified fall, initial encounter: Secondary | ICD-10-CM | POA: Diagnosis present

## 2024-11-04 DIAGNOSIS — Z85828 Personal history of other malignant neoplasm of skin: Secondary | ICD-10-CM

## 2024-11-04 DIAGNOSIS — I462 Cardiac arrest due to underlying cardiac condition: Secondary | ICD-10-CM | POA: Diagnosis not present

## 2024-11-04 DIAGNOSIS — I130 Hypertensive heart and chronic kidney disease with heart failure and stage 1 through stage 4 chronic kidney disease, or unspecified chronic kidney disease: Secondary | ICD-10-CM | POA: Diagnosis present

## 2024-11-04 DIAGNOSIS — D696 Thrombocytopenia, unspecified: Secondary | ICD-10-CM | POA: Diagnosis present

## 2024-11-04 DIAGNOSIS — Z9861 Coronary angioplasty status: Secondary | ICD-10-CM

## 2024-11-04 DIAGNOSIS — Z8249 Family history of ischemic heart disease and other diseases of the circulatory system: Secondary | ICD-10-CM

## 2024-11-04 DIAGNOSIS — A419 Sepsis, unspecified organism: Secondary | ICD-10-CM | POA: Diagnosis present

## 2024-11-04 DIAGNOSIS — I5022 Chronic systolic (congestive) heart failure: Secondary | ICD-10-CM | POA: Diagnosis present

## 2024-11-04 DIAGNOSIS — Z880 Allergy status to penicillin: Secondary | ICD-10-CM | POA: Diagnosis not present

## 2024-11-04 DIAGNOSIS — I4819 Other persistent atrial fibrillation: Secondary | ICD-10-CM | POA: Diagnosis present

## 2024-11-04 DIAGNOSIS — I4901 Ventricular fibrillation: Secondary | ICD-10-CM | POA: Diagnosis not present

## 2024-11-04 DIAGNOSIS — R509 Fever, unspecified: Principal | ICD-10-CM

## 2024-11-04 DIAGNOSIS — Z79899 Other long term (current) drug therapy: Secondary | ICD-10-CM

## 2024-11-04 LAB — RESP PANEL BY RT-PCR (RSV, FLU A&B, COVID)  RVPGX2
Influenza A by PCR: NEGATIVE
Influenza B by PCR: NEGATIVE
Resp Syncytial Virus by PCR: NEGATIVE
SARS Coronavirus 2 by RT PCR: NEGATIVE

## 2024-11-04 LAB — LACTIC ACID, PLASMA
Lactic Acid, Venous: 2.2 mmol/L (ref 0.5–1.9)
Lactic Acid, Venous: 2.3 mmol/L (ref 0.5–1.9)
Lactic Acid, Venous: 2.3 mmol/L (ref 0.5–1.9)
Lactic Acid, Venous: 2.4 mmol/L (ref 0.5–1.9)

## 2024-11-04 LAB — COMPREHENSIVE METABOLIC PANEL WITH GFR
ALT: 20 U/L (ref 0–44)
AST: 30 U/L (ref 15–41)
Albumin: 4.4 g/dL (ref 3.5–5.0)
Alkaline Phosphatase: 163 U/L — ABNORMAL HIGH (ref 38–126)
Anion gap: 13 (ref 5–15)
BUN: 27 mg/dL — ABNORMAL HIGH (ref 8–23)
CO2: 29 mmol/L (ref 22–32)
Calcium: 9.6 mg/dL (ref 8.9–10.3)
Chloride: 99 mmol/L (ref 98–111)
Creatinine, Ser: 1.79 mg/dL — ABNORMAL HIGH (ref 0.61–1.24)
GFR, Estimated: 36 mL/min — ABNORMAL LOW (ref >60)
Glucose, Bld: 112 mg/dL — ABNORMAL HIGH (ref 70–99)
Potassium: 3.8 mmol/L (ref 3.5–5.1)
Sodium: 141 mmol/L (ref 135–145)
Total Bilirubin: 2.4 mg/dL — ABNORMAL HIGH (ref 0.0–1.2)
Total Protein: 7.7 g/dL (ref 6.5–8.1)

## 2024-11-04 LAB — CBC WITH DIFFERENTIAL/PLATELET
Abs Immature Granulocytes: 0.1 K/uL — ABNORMAL HIGH (ref 0.00–0.07)
Basophils Absolute: 0 K/uL (ref 0.0–0.1)
Basophils Relative: 0 %
Eosinophils Absolute: 0 K/uL (ref 0.0–0.5)
Eosinophils Relative: 0 %
HCT: 40.5 % (ref 39.0–52.0)
Hemoglobin: 13.5 g/dL (ref 13.0–17.0)
Immature Granulocytes: 1 %
Lymphocytes Relative: 3 %
Lymphs Abs: 0.6 K/uL — ABNORMAL LOW (ref 0.7–4.0)
MCH: 29.8 pg (ref 26.0–34.0)
MCHC: 33.3 g/dL (ref 30.0–36.0)
MCV: 89.4 fL (ref 80.0–100.0)
Monocytes Absolute: 1.3 K/uL — ABNORMAL HIGH (ref 0.1–1.0)
Monocytes Relative: 7 %
Neutro Abs: 15.9 K/uL — ABNORMAL HIGH (ref 1.7–7.7)
Neutrophils Relative %: 89 %
Platelets: 113 K/uL — ABNORMAL LOW (ref 150–400)
RBC: 4.53 MIL/uL (ref 4.22–5.81)
RDW: 17.2 % — ABNORMAL HIGH (ref 11.5–15.5)
WBC: 17.9 K/uL — ABNORMAL HIGH (ref 4.0–10.5)
nRBC: 0 % (ref 0.0–0.2)

## 2024-11-04 LAB — URINALYSIS, W/ REFLEX TO CULTURE (INFECTION SUSPECTED)
Bacteria, UA: NONE SEEN
Bilirubin Urine: NEGATIVE
Glucose, UA: >=500 mg/dL — AB
Hgb urine dipstick: NEGATIVE
Ketones, ur: NEGATIVE mg/dL
Leukocytes,Ua: NEGATIVE
Nitrite: NEGATIVE
Protein, ur: NEGATIVE mg/dL
Specific Gravity, Urine: 1.014 (ref 1.005–1.030)
Squamous Epithelial / HPF: 0 /HPF (ref 0–5)
pH: 5 (ref 5.0–8.0)

## 2024-11-04 LAB — RESPIRATORY PANEL BY PCR

## 2024-11-04 LAB — APTT: aPTT: 43 s — ABNORMAL HIGH (ref 24–36)

## 2024-11-04 LAB — TROPONIN T, HIGH SENSITIVITY
Troponin T High Sensitivity: 34 ng/L — ABNORMAL HIGH (ref 0–19)
Troponin T High Sensitivity: 34 ng/L — ABNORMAL HIGH (ref 0–19)

## 2024-11-04 LAB — HEPARIN LEVEL (UNFRACTIONATED): Heparin Unfractionated: >1.1 [IU]/mL — ABNORMAL HIGH (ref 0.30–0.70)

## 2024-11-04 LAB — PROTIME-INR
INR: 1.3 — ABNORMAL HIGH (ref 0.8–1.2)
Prothrombin Time: 17.4 s — ABNORMAL HIGH (ref 11.4–15.2)

## 2024-11-04 LAB — MAGNESIUM: Magnesium: 2.2 mg/dL (ref 1.7–2.4)

## 2024-11-04 MED ORDER — AMIODARONE HCL IN DEXTROSE 360-4.14 MG/200ML-% IV SOLN
30.0000 mg/h | INTRAVENOUS | Status: DC
  Administered 2024-11-05: 30 mg/h via INTRAVENOUS
  Filled 2024-11-04: qty 200

## 2024-11-04 MED ORDER — HEPARIN BOLUS VIA INFUSION
2000.0000 [IU] | Freq: Once | INTRAVENOUS | Status: AC
  Administered 2024-11-04: 2000 [IU] via INTRAVENOUS
  Filled 2024-11-04: qty 2000

## 2024-11-04 MED ORDER — ALBUMIN HUMAN 25 % IV SOLN
50.0000 g | Freq: Once | INTRAVENOUS | Status: AC
  Administered 2024-11-04: 50 g via INTRAVENOUS
  Filled 2024-11-04: qty 200

## 2024-11-04 MED ORDER — VANCOMYCIN HCL 750 MG/150ML IV SOLN
750.0000 mg | Freq: Once | INTRAVENOUS | Status: AC
  Administered 2024-11-04: 750 mg via INTRAVENOUS
  Filled 2024-11-04: qty 150

## 2024-11-04 MED ORDER — SODIUM CHLORIDE 0.9 % IV BOLUS (SEPSIS)
1000.0000 mL | Freq: Once | INTRAVENOUS | Status: AC
  Administered 2024-11-04: 1000 mL via INTRAVENOUS

## 2024-11-04 MED ORDER — ACETAMINOPHEN 325 MG PO TABS: 650.0000 mg | ORAL_TABLET | Freq: Four times a day (QID) | ORAL | Status: DC | PRN

## 2024-11-04 MED ORDER — SODIUM CHLORIDE 0.9% FLUSH
3.0000 mL | Freq: Two times a day (BID) | INTRAVENOUS | Status: DC
  Administered 2024-11-04: 3 mL via INTRAVENOUS

## 2024-11-04 MED ORDER — METRONIDAZOLE 500 MG/100ML IV SOLN: 500.0000 mg | Freq: Two times a day (BID) | INTRAVENOUS | Status: DC

## 2024-11-04 MED ORDER — METRONIDAZOLE 500 MG/100ML IV SOLN
500.0000 mg | Freq: Once | INTRAVENOUS | Status: AC
  Administered 2024-11-04: 500 mg via INTRAVENOUS
  Filled 2024-11-04: qty 100

## 2024-11-04 MED ORDER — SENNOSIDES-DOCUSATE SODIUM 8.6-50 MG PO TABS: 1.0000 | ORAL_TABLET | Freq: Every evening | ORAL | Status: DC | PRN

## 2024-11-04 MED ORDER — HEPARIN (PORCINE) 25000 UT/250ML-% IV SOLN
1200.0000 [IU]/h | INTRAVENOUS | Status: DC
  Administered 2024-11-04: 1200 [IU]/h via INTRAVENOUS
  Filled 2024-11-04: qty 250

## 2024-11-04 MED ORDER — AMIODARONE HCL IN DEXTROSE 360-4.14 MG/200ML-% IV SOLN
60.0000 mg/h | INTRAVENOUS | Status: AC
  Administered 2024-11-04: 60 mg/h via INTRAVENOUS
  Filled 2024-11-04: qty 200

## 2024-11-04 MED ORDER — ACETAMINOPHEN 325 MG RE SUPP
650.0000 mg | Freq: Four times a day (QID) | RECTAL | Status: DC | PRN
  Administered 2024-11-04: 650 mg via RECTAL
  Filled 2024-11-04: qty 2

## 2024-11-04 MED ORDER — VANCOMYCIN HCL IN DEXTROSE 1-5 GM/200ML-% IV SOLN
1000.0000 mg | Freq: Once | INTRAVENOUS | Status: AC
  Administered 2024-11-04: 1000 mg via INTRAVENOUS
  Filled 2024-11-04: qty 200

## 2024-11-04 MED ORDER — VANCOMYCIN HCL 750 MG/150ML IV SOLN: 750.0000 mg | INTRAVENOUS | Status: DC

## 2024-11-04 MED ORDER — SODIUM CHLORIDE 0.9 % IV SOLN: 2.0000 g | INTRAVENOUS | Status: DC

## 2024-11-04 MED ORDER — ACETAMINOPHEN 325 MG RE SUPP
325.0000 mg | Freq: Once | RECTAL | Status: AC
  Administered 2024-11-04: 325 mg via RECTAL
  Filled 2024-11-04: qty 1

## 2024-11-04 MED ORDER — SODIUM CHLORIDE 0.9 % IV SOLN
2.0000 g | Freq: Once | INTRAVENOUS | Status: AC
  Administered 2024-11-04: 2 g via INTRAVENOUS
  Filled 2024-11-04: qty 12.5

## 2024-11-04 NOTE — H&P (Addendum)
 History and Physical    [employer]. FMW:[id number] DOB: 13-Apr-1933 DOA: 11/04/2024  DOS: the patient was seen and examined on 11/04/2024  PCP: Auston Reyes BIRCH, MD   Patient coming from: Home  I have personally briefly reviewed patient's old medical records in [facility] and CareEverywhere  HPI:   Calvin Stelly. is a 89 y.o. year old male with medical history of HTN, HLD, Paroxysmal afib and dementia presenting to the ED after a fall and with more confusion.    Pt unable to contribute to history.  Wife is at bedside along with son.  He lives with the wife and she states patient was at baseline yesterday.  Patient's baseline includes mobilizing with a walker and oriented to self but not place or time.  She reports he had tried to get up early this morning and slid down his bed but she does not think he hit his head.  She was able to help him get back into the bed but his mentation has not been at baseline since.  She states patient did not endorse any cough, complaints, URI symptoms. Per wife pt did have some dental work done yesterday but states it was minor but doesn't know what it was exactly.  She reports patient do not take his medications today.   On arrival to the ED patient was noted to be HDS stable.  Lab work and imaging obtained.  CBC with significant leukocytosis at 17.9, baseline hemoglobin and thrombocytopenia at 113.  CMP with CKD 3B and elevation of his bilirubin at 2.4 which appears to be chronic for him and ALP elevation.  UA without signs of infection.  Chest x-ray without any focal opacity but with decreased inspiratory effort. Respiratory panel pending.  Lactic acid pending.  Blood cultures obtained.  Patient started on broad-spectrum antibiotics.  Given his presentation, [facility] contacted for admission.  Review of Systems: unable to review all systems due to the inability of the patient to answer questions.   Past Medical History:   Diagnosis Date   Acute embolic stroke (HCC) 02/01/2017   BPH (benign prostatic hyperplasia)    Bradycardia 11/10/2015   Coronary artery disease    Dizziness 08/21/2012   DYSPEPSIA 07/31/2010   Qualifier: Diagnosis of  By: Sebastian RN, Rosina     Hyperlipidemia 05/16/2010   Qualifier: Diagnosis of  By: Perla MD, [name]      Hypertension    HYPERTENSION, BENIGN 05/16/2010   Qualifier: Diagnosis of  By: Perla MD, [name]     Stenosis of right carotid artery 02/01/2017    Past Surgical History:  Procedure Laterality Date   ESOPHAGOGASTRODUODENOSCOPY (EGD) WITH PROPOFOL  N/A 01/08/2023   Procedure: ESOPHAGOGASTRODUODENOSCOPY (EGD) WITH PROPOFOL ;  Surgeon: Jinny Carmine, MD;  Location: [facility] ENDOSCOPY;  Service: Endoscopy;  Laterality: N/A;   HIP SURGERY     right hip    Intestinal Blockage     LEFT HEART CATH AND CORONARY ANGIOGRAPHY N/A 06/15/2023   Procedure: LEFT HEART CATH AND CORONARY ANGIOGRAPHY;  Surgeon: Anner Alm ORN, MD;  Location: [facility] INVASIVE CV LAB;  Service: Cardiovascular;  Laterality: N/A;   LOOP RECORDER INSERTION N/A 05/14/2017   Procedure: Loop Recorder Insertion;  Surgeon: Fernande Elspeth BROCKS, MD;  Location: [facility] INVASIVE CV LAB;  Service: Cardiovascular;  Laterality: N/A;   SKIN CANCER EXCISION     x2   TONSILLECTOMY       Allergies  Allergen Reactions   Penicillins Dermatitis and Rash  Rash   Gabapentin    Penicillin G    Codeine Nausea Only and Nausea And Vomiting    Other reaction(s): Vomiting Other reaction(s): Vomiting    Family History  Problem Relation Age of Onset   Hypertension Mother    Cancer Other    Hyperlipidemia Other    Hypertension Other    Other Other        CHF    Prior to Admission medications   Medication Sig Start Date End Date Taking? Authorizing Provider  acetaminophen  (TYLENOL ) 650 MG CR tablet Take 650 mg by mouth every 8 (eight) hours as needed.     [provider]  amiodarone (PACERONE) 200 MG tablet Take 200 mg by mouth  daily.    [provider]  apixaban  (ELIQUIS ) 2.5 MG TABS tablet Take 1 tablet (2.5 mg total) by mouth 2 (two) times daily. 07/02/23   Gollan, Timothy J, MD  Ascorbic Acid (VITAMIN C ) 1000 MG tablet Take 1,000 mg by mouth daily.    [provider]  atorvastatin  (LIPITOR) 20 MG tablet Take 20 mg by mouth at bedtime.    [provider]  b complex vitamins tablet Take 1 tablet by mouth daily.    [provider]  cholecalciferol  (VITAMIN D ) 1000 units tablet Take 2,000 Units by mouth daily.    [provider]  clopidogrel  (PLAVIX ) 75 MG tablet Take 1 tablet (75 mg total) by mouth daily. 07/16/23   Gollan, Timothy J, MD  cyanocobalamin  (VITAMIN B12) 1000 MCG tablet Take 1,000 mcg by mouth daily.    [provider]  docusate sodium  (COLACE) 100 MG capsule Take 100 mg by mouth 2 (two) times daily.    [provider]  empagliflozin (JARDIANCE) 25 MG TABS tablet Take 12.5 mg by mouth. 07/15/23   [provider]  ezetimibe  (ZETIA ) 10 MG tablet Take 1 tablet (10 mg total) by mouth daily. 07/27/24   Gollan, Timothy J, MD  FLAXSEED, LINSEED, PO Take 2 g by mouth daily.    [provider]  gentamicin  cream (GARAMYCIN ) 0.1 % Apply to affected toe once daily. 07/09/24   Gaynel Delon CROME, DPM  metoprolol  succinate (TOPROL -XL) 25 MG 24 hr tablet Take 12.5 mg by mouth daily. 12/12/23 12/11/24  [provider]  nitroGLYCERIN  (NITROSTAT ) 0.4 MG SL tablet Place 1 tablet (0.4 mg total) under the tongue every 5 (five) minutes x 3 doses as needed for chest pain. 06/18/23   Williams, Evan, PA-C  pantoprazole  (PROTONIX ) 40 MG tablet Take 1 tablet (40 mg total) by mouth 2 (two) times daily. Patient taking differently: Take 40 mg by mouth daily. 01/11/23   Leotis Bogus, MD  potassium chloride  (KLOR-CON  M) 10 MEQ tablet Take 1 tablet (10 mEq total) by mouth daily. May take up to three times daily as needed with extra Torsemide . 10/20/24   Gollan,  Timothy J, MD  pregabalin  (LYRICA ) 150 MG capsule Take 150 mg by mouth at bedtime. Patient taking differently: Take 100 mg by mouth at bedtime. 05/23/22   [provider]  Probiotic Product (UP4 PROBIOTICS PO) Take 7.5 mg by mouth.    [provider]  Psyllium (GERI-MUCIL) 25 % POWD Take by mouth.    [provider]  ranolazine  (RANEXA ) 500 MG 12 hr tablet Take 1 tablet (500 mg total) by mouth 2 (two) times daily. 07/10/24   Gollan, Timothy J, MD  silver  sulfADIAZINE  (SILVADENE ) 1 % cream Apply 1 Application topically daily. 07/23/24  Tobie Franky SQUIBB, DPM  tamsulosin  (FLOMAX ) 0.4 MG CAPS capsule Take 2 capsules (0.8 mg total) by mouth daily. 04/21/19   Penne Knee, MD  torsemide  (DEMADEX ) 20 MG tablet Take 1 tablet (20 mg total) by mouth daily. May take up to three times daily as needed for weight gain and swelling. 10/20/24   Gollan, Timothy J, MD    Social History:  reports that he has never smoked. He has never used smokeless tobacco. He reports that he does not currently use alcohol. He reports that he does not use drugs. Lives with wife Tobacco- Denies use. EtOH- Denies use.  Illicit drug use- denies use.  IADLs/ADLs-needs assistance   Physical Exam: Vitals:   11/04/24 1400 11/04/24 1429 11/04/24 1500 11/04/24 1530  BP: (!) 117/98  94/64 (!) 102/52  Pulse: (!) 102  89 (!) 43  Resp: 19  18 (!) 28  Temp:  (!) 102.7 F (39.3 C)    TempSrc:  Rectal    SpO2:   96%   Weight:      Height:         Gen: NAD HENT: NCAT CV: Regular rate and regular rhythm, good radial pulse, diminished pedal pulse Resp: CTAB, limited on anterior auscultation Abd: TTP in the epigastric and right upper quadrant MSK: No symmetry Skin: No rashes noted on skin Neuro: Open eyes to command and following commands intermittently.  Neck supple, Brudzinski sign negative, no focal deficit but does have generalized weakness. Psych: Pleasant mood   Labs on Admission: I have  personally reviewed following labs and imaging studies  CBC: Recent Labs  Lab 11/04/24 1028  WBC 17.9*  NEUTROABS 15.9*  HGB 13.5  HCT 40.5  MCV 89.4  PLT 113*   Basic Metabolic Panel: Recent Labs  Lab 11/04/24 1028  NA 141  K 3.8  CL 99  CO2 29  GLUCOSE 112*  BUN 27*  CREATININE 1.79*  CALCIUM  9.6   GFR: Estimated Creatinine Clearance: 27.6 mL/min (A) (by C-G formula based on SCr of 1.79 mg/dL (H)). Liver Function Tests: Recent Labs  Lab 11/04/24 1028  AST 30  ALT 20  ALKPHOS 163*  BILITOT 2.4*  PROT 7.7  ALBUMIN 4.4   No results for input(s): LIPASE, AMYLASE in the last 168 hours. No results for input(s): AMMONIA in the last 168 hours. Coagulation Profile: No results for input(s): INR, PROTIME in the last 168 hours. Cardiac Enzymes: No results for input(s): CKTOTAL, CKMB, CKMBINDEX, TROPONINI, TROPONINIHS in the last 168 hours. BNP (last 3 results) No results for input(s): BNP in the last 8760 hours. HbA1C: No results for input(s): HGBA1C in the last 72 hours. CBG: No results for input(s): GLUCAP in the last 168 hours. Lipid Profile: No results for input(s): CHOL, HDL, LDLCALC, TRIG, CHOLHDL, LDLDIRECT in the last 72 hours. Thyroid Function Tests: No results for input(s): TSH, T4TOTAL, FREET4, T3FREE, THYROIDAB in the last 72 hours. Anemia Panel: No results for input(s): VITAMINB12, FOLATE, FERRITIN, TIBC, IRON, RETICCTPCT in the last 72 hours. Urine analysis:    Component Value Date/Time   COLORURINE YELLOW (A) 11/04/2024 1244   APPEARANCEUR CLEAR (A) 11/04/2024 1244   APPEARANCEUR Clear 05/29/2018 1049   LABSPEC 1.014 11/04/2024 1244   LABSPEC 1.011 09/13/2012 1609   PHURINE 5.0 11/04/2024 1244   GLUCOSEU >=500 (A) 11/04/2024 1244   GLUCOSEU Negative 09/13/2012 1609   HGBUR NEGATIVE 11/04/2024 1244   BILIRUBINUR NEGATIVE 11/04/2024 1244   BILIRUBINUR Negative 05/29/2018 1049    BILIRUBINUR Negative  09/13/2012 1609   KETONESUR NEGATIVE 11/04/2024 1244   PROTEINUR NEGATIVE 11/04/2024 1244   NITRITE NEGATIVE 11/04/2024 1244   LEUKOCYTESUR NEGATIVE 11/04/2024 1244   LEUKOCYTESUR Negative 09/13/2012 1609    Radiological Exams on Admission: I have personally reviewed images CT Cervical Spine Wo Contrast Result Date: 11/04/2024 EXAM: CT CERVICAL SPINE WITHOUT CONTRAST 11/04/2024 11:19:09 AM TECHNIQUE: CT of the cervical spine was performed without the administration of intravenous contrast. Multiplanar reformatted images are provided for review. Automated exposure control, iterative reconstruction, and/or weight based adjustment of the mA/kV was utilized to reduce the radiation dose to as low as reasonably achievable. COMPARISON: CT cervical spine 06/28/2024. CLINICAL HISTORY: Neck trauma (Age >= 65y). Fall. FINDINGS: CERVICAL SPINE: BONES AND ALIGNMENT: Unchanged trace anterolisthesis of C7 on T1. No acute fracture or suspicious lesion. DEGENERATIVE CHANGES: Moderate multilevel disc degeneration and advanced facet arthrosis. Mild to moderate multilevel spinal stenosis and moderate to severe multilevel neural foraminal stenosis. SOFT TISSUES: No prevertebral soft tissue swelling. IMPRESSION: 1. No acute cervical spine fracture or traumatic malalignment. Electronically signed by: Dasie Hamburg MD 11/04/2024 11:43 AM EST RP Workstation: [id number]   CT Head Wo Contrast Result Date: 11/04/2024 EXAM: CT HEAD WITHOUT CONTRAST 11/04/2024 11:19:09 AM TECHNIQUE: CT of the head was performed without the administration of intravenous contrast. Automated exposure control, iterative reconstruction, and/or weight based adjustment of the mA/kV was utilized to reduce the radiation dose to as low as reasonably achievable. COMPARISON: Head CT 06/28/2024. CLINICAL HISTORY: Head trauma, minor (Age >= 65y). FINDINGS: BRAIN AND VENTRICLES: There is no evidence of an acute infarct, intracranial hemorrhage,  mass, midline shift, hydrocephalus, or extra-axial fluid collection. A chronic left parietooccipital infarct is unchanged with ex vacuo dilatation of the atrium of the left lateral ventricle. Hypodensities elsewhere in the cerebral white matter bilaterally are unchanged and nonspecific but compatible with mild chronic small vessel ischemic disease. Cerebral atrophy is similar to the prior study and most notable in the temporal lobes. Calcified atherosclerosis at the skull base. ORBITS: Bilateral cataract extraction. SINUSES: Mild mucosal thickening in the paranasal sinuses. Included mastoid air cells are clear. SOFT TISSUES AND SKULL: No acute soft tissue abnormality. No skull fracture. IMPRESSION: 1. No acute intracranial abnormality. 2. Unchanged chronic left parietooccipital infarct and chronic small vessel ischemia in the cerebral white matter. Electronically signed by: Dasie Hamburg MD 11/04/2024 11:38 AM EST RP Workstation: [id number]   DG Chest Portable 1 View Result Date: 11/04/2024 EXAM: 1 VIEW(S) XRAY OF THE CHEST 11/04/2024 10:40:00 AM COMPARISON: Chest radiograph 06/15/2023. CLINICAL HISTORY: 89 year old male. Weakness. FINDINGS: LINES, TUBES AND DEVICES: Loop recorder in place. TAVR in place. LUNGS AND PLEURA: Low lung volumes. Symmetric pulmonary vascular congestion, similar to prior exam and no overt edema. No focal pulmonary opacity. No pleural effusion. No pneumothorax. HEART AND MEDIASTINUM: Cardiomegaly. Aortic calcification. BONES AND SOFT TISSUES: No acute osseous abnormality. IMPRESSION: 1. Cardiomegaly with prior TAVR. 2. Low lung volumes with pulmonary vascular congestion but no overt edema. Electronically signed by: Helayne Hurst MD 11/04/2024 10:57 AM EST RP Workstation: [id number]    EKG: My personal interpretation of EKG shows: Pending  Assessment/Plan Principal Problem:   Sepsis (HCC) Active Problems:   CAD S/P percutaneous coronary angioplasty   Hyperlipidemia   HTN  (hypertension)   History of CVA (cerebrovascular accident)   Benign prostatic hyperplasia   Dementia without behavioral disturbance (HCC)   Persistent atrial fibrillation (HCC)   Patient with change in his mental status along with high-grade fever admitted for likely sepsis  but exact source not yet determined.  I ordered CT abdomen pelvis as patient is having some tenderness to palpation and will get right upper quadrant ultrasound as well.  UA and chest x-ray negative although chest x-ray has low lung volumes which could be hiding an infiltrate.  Patient does have borderline hypotension so we will admit him to stepdown as he may need pressor support.  Currently his MAP is greater than 65 we will hold off.  Pt is status post IV bolus, Will limit his fluids given he has EF of 30%.  I have ordered albumin  for blood pressure support.  Will continue broad-spectrum antibiotics for now.  Blood cultures ordered and pending.  Will also get respiratory panel with 20 pathogens.   Interval update: CTAP non contrast without any acute findings, given his recent dental work and echo. Since his CXR showed decreased lung volumes will get CT chest to look for pneumonia.   Hypertension: Holding home meds given active infection and borderline hypotension.  Persistent atrial fibrillation: Patient is on oral amiodarone  and will start him on IV amiodarone  given he may not be able to tolerate p.o.  Given his encephalopathy. On chart review pt with recent cardioversion but suspect has converted back given underlying infection. Will place him on IV heparin  until pt can tolerate oral intake.   Dementia: Will order delirium precautions.  Generalized weakness: Will consult PT OT.  HFrEF: Holding home meds and patient is mentating well and able to tolerate p.o. intake.  Also patient with hypotension and holding medications for that reason as well    VTE prophylaxis:  heparin  gtt Diet: N.p.o. Code Status:  Full  Code Telemetry:  Admission status: Inpatient, Progressive Patient is from: Home Anticipated d/c is to: Home Anticipated d/c is in: 2-3 days   Family Communication: Updated at bedside  Consults called: None   Severity of Illness: The appropriate patient status for this patient is INPATIENT. Inpatient status is judged to be reasonable and necessary in order to provide the required intensity of service to ensure the patient's safety. The patient's presenting symptoms, physical exam findings, and initial radiographic and laboratory data in the context of their chronic comorbidities is felt to place them at high risk for further clinical deterioration. Furthermore, it is not anticipated that the patient will be medically stable for discharge from the hospital within 2 midnights of admission.   * I certify that at the point of admission it is my clinical judgment that the patient will require inpatient hospital care spanning beyond 2 midnights from the point of admission due to high intensity of service, high risk for further deterioration and high frequency of surveillance required.DEWAINE Morene Bathe, MD [facility] DEL. [facility]

## 2024-11-04 NOTE — Progress Notes (Signed)
 CODE SEPSIS - PHARMACY COMMUNICATION  **Broad Spectrum Antibiotics should be administered within 1 hour of Sepsis diagnosis**  Time Code Sepsis Called/Page Received: 13:20  Antibiotics Ordered: Cefepime, vancomycin, flagyl  Time of 1st antibiotic administration: 13:33  Additional action taken by pharmacy: N/A  If necessary, Name of Provider/Nurse Contacted: N/A    Ransom Blanch PGY-1 Pharmacy Resident  [place] [facility]  11/04/2024 1:24 PM

## 2024-11-04 NOTE — Consult Note (Signed)
 Pharmacy Antibiotic Note  [name]. is a 89 y.o. male admitted on 11/04/2024 with sepsis due to undetermined source. Patient with history of CAD, HTN, CVA, and dementia. Of note, patient has had multiple recent visits with podiatry following right great toe ulcer without current concern for osteo. Not given outpatient antibiotics for toe ulcer. Pharmacy has been consulted for vancomycin and cefepime dosing.  12/3: D1 vancomycin, cefepime Renal fxn: Scr 1.79 (baseline ~1.4) Wbc 17.9, 104.57F, lac 2.2, [place] 2L (unclear baseline) 12/3 Bcx: in process 12/3 resp panel: negative 12/3 cxr/ct: neg pna 12/3 ct head/spine: not c/f meningitis  Cefepime Received 2 g IV at 1333  Vancomycin Received loading dose 1 g IV at 1527  Plan: Patient not in AKA, will initiate maintenance vancomycin dosing by AUC Order additional 750 mg IV x1 for total LD 1750 mg Order scheduled 750 mg IV q24h starting 12/4 PM cAUC 471.0; Cmin 14.3; Scr used 1.79; Vd 0.72 L/kg Order cefepime 2g IV q24h starting 12/4 PM Order MRSA swab Monitor sepsis, renal function, and cultures for adjustments   Height: 5' 8 (172.7 cm) Weight: 81 kg (178 lb 9.2 oz) IBW/kg (Calculated) : 68.4  Temp (24hrs), Avg:101.9 F (38.8 C), Min:98.5 F (36.9 C), Max:104.6 F (40.3 C)  Recent Labs  Lab 11/04/24 1028 11/04/24 1336 11/04/24 1636  WBC 17.9*  --   --   CREATININE 1.79*  --   --   LATICACIDVEN  --  2.2* 2.3*    Estimated Creatinine Clearance: 27.6 mL/min (A) (by C-G formula based on SCr of 1.79 mg/dL (H)).    Allergies  Allergen Reactions   Penicillins Dermatitis and Rash    Rash   Gabapentin    Penicillin G    Codeine Nausea Only and Nausea And Vomiting    Other reaction(s): Vomiting Other reaction(s): Vomiting    Antimicrobials this admission: 12/3 cefepime x1 12/3 metronidazole x1  12/3 vancomycin >>   Thank you for allowing pharmacy to be a part of this patient's care.  [name]  [name] 11/04/2024 6:08 PM

## 2024-11-04 NOTE — Sepsis Progress Note (Signed)
 Confirmed with bedside RN Maegan that blood cultures were drawn before administration of the antibiotic.

## 2024-11-04 NOTE — Consult Note (Signed)
 PHARMACY - ANTICOAGULATION CONSULT NOTE  Pharmacy Consult for heparin  dosing Indication: atrial fibrillation  Allergies  Allergen Reactions   Penicillins Dermatitis and Rash    Rash   Gabapentin    Penicillin G    Codeine Nausea Only and Nausea And Vomiting    Other reaction(s): Vomiting Other reaction(s): Vomiting    Patient Measurements: Height: 5' 8 (172.7 cm) Weight: 81 kg (178 lb 9.2 oz) IBW/kg (Calculated) : 68.4 HEPARIN  DW (KG): 81  Vital Signs: Temp: 100.3 F (37.9 C) (12/03 1831) Temp Source: Axillary (12/03 1831) BP: 98/65 (12/03 1830) Pulse Rate: 93 (12/03 1830)  Labs: Recent Labs    11/04/24 1028  HGB 13.5  HCT 40.5  PLT 113*  CREATININE 1.79*    Estimated Creatinine Clearance: 27.6 mL/min (A) (by C-G formula based on SCr of 1.79 mg/dL (H)).   Medical History: Past Medical History:  Diagnosis Date   Acute embolic stroke (HCC) 02/01/2017   BPH (benign prostatic hyperplasia)    Bradycardia 11/10/2015   Coronary artery disease    Dizziness 08/21/2012   DYSPEPSIA 07/31/2010   Qualifier: Diagnosis of  By: Sebastian RN, Rosina     Hyperlipidemia 05/16/2010   Qualifier: Diagnosis of  By: Perla MD, [name]      Hypertension    HYPERTENSION, BENIGN 05/16/2010   Qualifier: Diagnosis of  By: Perla MD, [name]     Stenosis of right carotid artery 02/01/2017    Medications:  PTA apixaban  2.5 mg BID (last dose 12/2 PM)  Assessment: [name] is an 13 yoM that presented on 12/3 for fall (no mention of LOC) and c/f AMS. Patient with history of CAD, HTN, CVA, dementia, and pAfib on Eliquis . Pharmacy has been consulted for heparin  dosing.  This admission so far, patient's rhythm noted to be in Afib. Patient on PTA Eliquis  2.5 mg BID, with last dose reported by patient on 12/2 at 1800.   Baseline Hgb 13.5, plt 113 (baseline ~120-160).  Goal of Therapy:  Heparin  level 0.3-0.7 units/ml Monitor platelets by anticoagulation protocol: Yes   Plan:   Baseline  aPTT and INR already ordered by provider - added baseline HL Give heparin  bolus 2000 units (half bolus) and start infusion at 1200 units/hr Check aPTT in 8 hours and daily while on heparin  Check HL with AM labs Monitor CBC daily with AM labs  [name] [name] 11/04/2024,7:33 PM

## 2024-11-04 NOTE — ED Triage Notes (Addendum)
 Pt arrives from home via [facility] w/ c/o fall that happened around 4am this morning. Per EMS, wife reports that EMS was called this morning and pt was still able to ambulate with walker but then there was a change in mental status and mobility according to wife. Per EMS, pt has a hx of CHF, MI, stroke, tremors and dementia. Per EMS, pt takes Eliquis . Per EMS, pt was A&Ox1. Wife reports that pt slid out of the bed this morning and did not hit his head, EMS got him back in bed, and later this morning she states that pt is more altered and just does not look right. Wife states that pt at baseline pt is A&O to self only. Wife at bedside.

## 2024-11-04 NOTE — ED Provider Notes (Signed)
 [facility] Provider Note    Event Date/Time   First MD Initiated Contact with Patient 11/04/24 1023     (approximate)   History   Fall   HPI  [name]. is a 89 year old male with history of CAD, HTN, CVA, dementia presenting to the emergency department for evaluation of altered mental status.  Companied by wife who provides collateral history.  She reports that at baseline patient is able to say his name and birthday, get around by himself.  Was normal when they went to bed at 9 PM last night.  Around 4 AM this morning patient tried to get out of bed and use his walker which he normally is able to but slid down onto the ground.  Wife does not think he hit his head.  EMS was called and help patient back into bed and was able to walk with a walker, but later this morning she noticed that patient was not as responsive at his baseline with worsened mobility.  Presents to the ER in the setting of this.     Physical Exam   Triage Vital Signs: ED Triage Vitals  Encounter Vitals Group     BP 11/04/24 1022 106/71     Girls Systolic BP Percentile --      Girls Diastolic BP Percentile --      Boys Systolic BP Percentile --      Boys Diastolic BP Percentile --      Pulse Rate 11/04/24 1022 95     Resp 11/04/24 1022 17     Temp 11/04/24 1022 98.5 F (36.9 C)     Temp Source 11/04/24 1022 Oral     SpO2 11/04/24 1012 95 %     Weight 11/04/24 1023 178 lb 9.2 oz (81 kg)     Height 11/04/24 1023 5' 8 (1.727 m)     Head Circumference --      Peak Flow --      Pain Score --      Pain Loc --      Pain Education --      Exclude from Growth Chart --     Most recent vital signs: Vitals:   11/04/24 1330 11/04/24 1429  BP: 102/62   Pulse: (!) 109   Resp: (!) 22   Temp:  (!) 102.7 F (39.3 C)  SpO2: 96%      General: Awake, interactive  CV:  Good peripheral perfusion Resp:  Unlabored respirations Abd:  Nondistended.  Neuro:  No gross  facial asymmetry, increased tone in extremities, able to hold bilateral arms antigravity for 10 seconds, unable to hold bilateral lower extremities antigravity though question some component of patient understanding.  Unable to take part in finger-nose testing.  Sensation appears intact.  Right pupil 3 mm, left pupil 2 mm.   ED Results / Procedures / Treatments   Labs (all labs ordered are listed, but only abnormal results are displayed) Labs Reviewed  CBC WITH DIFFERENTIAL/PLATELET - Abnormal; Notable for the following components:      Result Value   WBC 17.9 (*)    RDW 17.2 (*)    Platelets 113 (*)    Neutro Abs 15.9 (*)    Lymphs Abs 0.6 (*)    Monocytes Absolute 1.3 (*)    Abs Immature Granulocytes 0.10 (*)    All other components within normal limits  COMPREHENSIVE METABOLIC PANEL WITH GFR - Abnormal; Notable for the following components:  Glucose, Bld 112 (*)    BUN 27 (*)    Creatinine, Ser 1.79 (*)    Alkaline Phosphatase 163 (*)    Total Bilirubin 2.4 (*)    GFR, Estimated 36 (*)    All other components within normal limits  URINALYSIS, W/ REFLEX TO CULTURE (INFECTION SUSPECTED) - Abnormal; Notable for the following components:   Color, Urine YELLOW (*)    APPearance CLEAR (*)    Glucose, UA >=500 (*)    All other components within normal limits  LACTIC ACID, PLASMA - Abnormal; Notable for the following components:   Lactic Acid, Venous 2.2 (*)    All other components within normal limits  TROPONIN T, HIGH SENSITIVITY - Abnormal; Notable for the following components:   Troponin T High Sensitivity 34 (*)    All other components within normal limits  TROPONIN T, HIGH SENSITIVITY - Abnormal; Notable for the following components:   Troponin T High Sensitivity 34 (*)    All other components within normal limits  RESP PANEL BY RT-PCR (RSV, FLU A&B, COVID)  [id number]  CULTURE, BLOOD (ROUTINE X 2)  CULTURE, BLOOD (ROUTINE X 2)  LACTIC ACID, PLASMA     EKG EKG  independently reviewed and interpreted by myself demonstrates:  EKG demonstrates A-fib at a rate of 83, QRS 174, QTc 565, no acute ST changes  RADIOLOGY Imaging independently reviewed and interpreted by myself demonstrates:  CT head without acute bleed CT C-spine no acute fracture CXR without ammonia  Formal Radiology Read:  CT Cervical Spine Wo Contrast Result Date: 11/04/2024 EXAM: CT CERVICAL SPINE WITHOUT CONTRAST 11/04/2024 11:19:09 AM TECHNIQUE: CT of the cervical spine was performed without the administration of intravenous contrast. Multiplanar reformatted images are provided for review. Automated exposure control, iterative reconstruction, and/or weight based adjustment of the mA/kV was utilized to reduce the radiation dose to as low as reasonably achievable. COMPARISON: CT cervical spine 06/28/2024. CLINICAL HISTORY: Neck trauma (Age >= 65y). Fall. FINDINGS: CERVICAL SPINE: BONES AND ALIGNMENT: Unchanged trace anterolisthesis of C7 on T1. No acute fracture or suspicious lesion. DEGENERATIVE CHANGES: Moderate multilevel disc degeneration and advanced facet arthrosis. Mild to moderate multilevel spinal stenosis and moderate to severe multilevel neural foraminal stenosis. SOFT TISSUES: No prevertebral soft tissue swelling. IMPRESSION: 1. No acute cervical spine fracture or traumatic malalignment. Electronically signed by: Dasie Hamburg MD 11/04/2024 11:43 AM EST RP Workstation: [id number]   CT Head Wo Contrast Result Date: 11/04/2024 EXAM: CT HEAD WITHOUT CONTRAST 11/04/2024 11:19:09 AM TECHNIQUE: CT of the head was performed without the administration of intravenous contrast. Automated exposure control, iterative reconstruction, and/or weight based adjustment of the mA/kV was utilized to reduce the radiation dose to as low as reasonably achievable. COMPARISON: Head CT 06/28/2024. CLINICAL HISTORY: Head trauma, minor (Age >= 65y). FINDINGS: BRAIN AND VENTRICLES: There is no evidence of an acute  infarct, intracranial hemorrhage, mass, midline shift, hydrocephalus, or extra-axial fluid collection. A chronic left parietooccipital infarct is unchanged with ex vacuo dilatation of the atrium of the left lateral ventricle. Hypodensities elsewhere in the cerebral white matter bilaterally are unchanged and nonspecific but compatible with mild chronic small vessel ischemic disease. Cerebral atrophy is similar to the prior study and most notable in the temporal lobes. Calcified atherosclerosis at the skull base. ORBITS: Bilateral cataract extraction. SINUSES: Mild mucosal thickening in the paranasal sinuses. Included mastoid air cells are clear. SOFT TISSUES AND SKULL: No acute soft tissue abnormality. No skull fracture. IMPRESSION: 1. No acute intracranial abnormality. 2. Unchanged  chronic left parietooccipital infarct and chronic small vessel ischemia in the cerebral white matter. Electronically signed by: Dasie Hamburg MD 11/04/2024 11:38 AM EST RP Workstation: [id number]   DG Chest Portable 1 View Result Date: 11/04/2024 EXAM: 1 VIEW(S) XRAY OF THE CHEST 11/04/2024 10:40:00 AM COMPARISON: Chest radiograph 06/15/2023. CLINICAL HISTORY: 89 year old male. Weakness. FINDINGS: LINES, TUBES AND DEVICES: Loop recorder in place. TAVR in place. LUNGS AND PLEURA: Low lung volumes. Symmetric pulmonary vascular congestion, similar to prior exam and no overt edema. No focal pulmonary opacity. No pleural effusion. No pneumothorax. HEART AND MEDIASTINUM: Cardiomegaly. Aortic calcification. BONES AND SOFT TISSUES: No acute osseous abnormality. IMPRESSION: 1. Cardiomegaly with prior TAVR. 2. Low lung volumes with pulmonary vascular congestion but no overt edema. Electronically signed by: Helayne Hurst MD 11/04/2024 10:57 AM EST RP Workstation: [id number]    PROCEDURES:  Critical Care performed: Yes, see critical care procedure note(s)  CRITICAL CARE Performed by: Nilsa Dade   Total critical care time: 32  minutes  Critical care time was exclusive of separately billable procedures and treating other patients.  Critical care was necessary to treat or prevent imminent or life-threatening deterioration.  Critical care was time spent personally by me on the following activities: development of treatment plan with patient and/or surrogate as well as nursing, discussions with consultants, evaluation of patient's response to treatment, examination of patient, obtaining history from patient or surrogate, ordering and performing treatments and interventions, ordering and review of laboratory studies, ordering and review of radiographic studies, pulse oximetry and re-evaluation of patient's condition.   Procedures   MEDICATIONS ORDERED IN ED: Medications  metroNIDAZOLE (FLAGYL) IVPB 500 mg (500 mg Intravenous New Bag/Given 11/04/24 1414)  vancomycin (VANCOCIN) IVPB 1000 mg/200 mL premix (has no administration in time range)  acetaminophen  (TYLENOL ) suppository 325 mg (325 mg Rectal Given 11/04/24 1328)  sodium chloride  0.9 % bolus 1,000 mL (1,000 mLs Intravenous New Bag/Given 11/04/24 1332)  ceFEPIme (MAXIPIME) 2 g in sodium chloride  0.9 % 100 mL IVPB (0 g Intravenous Stopped 11/04/24 1409)     IMPRESSION / MDM / ASSESSMENT AND PLAN / ED COURSE  I reviewed the triage vital signs and the nursing notes.  Differential diagnosis includes, but is not limited to, intracranial bleed, spine fracture, notice of thoracoabdominal trauma, UTI, pneumonia, other infectious source, anemia, electrolyte abnormality  Patient's presentation is most consistent with acute presentation with potential threat to life or bodily function.  89 year old male presenting to the emergency department for evaluation of altered mental status in the setting of recent fall.  Stable vitals on presentation.  No obvious focal deficits, but limited exam due to patient's confusion.  Will obtain labs, CT head to further evaluate.  Clinical  Course as of 11/04/24 1511  Wed Nov 04, 2024  1328 Notified by RN that patient warm to touch on reevaluation had a rectal temp of 104.6.  With leukocytosis I am concerned about infection though source not completely clear.  Sepsis orders initiated with cefepime, vancomycin, Flagyl, IV fluids, Tylenol .  Labs did demonstrate leukocytosis at 17.9, normal hemoglobin.  CMP with stable renal impairment.  Urine without evidence of infection.  Initial troponin slightly elevated at 34.  CT head and C-spine without acute traumatic injury.  X-Nixie Laube without evidence of pneumonia.  Do think patient is appropriate for admission for further evaluation.  Will reach out to hospitalist team. [NR]  1330 Additional collateral from wife.  Reports no recent cough, congestion, abdominal pain, nausea, vomiting. No recent headache or neck pain.  She reports patient's confusion is similar to only slightly worse than his baseline. No meningismus on exam. Overall lower suspicion for meningitis. She reports that she had an upper respiratory infection a few weeks ago. [NR]  1430 Case discussed with hospitalist team.  He will evaluate for anticipate admission. [NR]    Clinical Course User Index [NR] Levander Slate, MD       FINAL CLINICAL IMPRESSION(S) / ED DIAGNOSES   Final diagnoses:  Fever of unknown origin     Rx / DC Orders   ED Discharge Orders     None        Note:  This document was prepared using Dragon voice recognition software and may include unintentional dictation errors.   Levander Slate, MD 11/04/24 [phone]

## 2024-11-04 NOTE — Sepsis Progress Note (Signed)
 eLink is following this Code Sepsis.

## 2024-11-04 NOTE — ED Notes (Signed)
 Called CCMD to add pt to monitoring.

## 2024-11-05 DIAGNOSIS — I469 Cardiac arrest, cause unspecified: Secondary | ICD-10-CM

## 2024-11-05 LAB — BLOOD CULTURE ID PANEL (REFLEXED) - BCID2

## 2024-11-05 LAB — CBG MONITORING, ED: Glucose-Capillary: 129 mg/dL — ABNORMAL HIGH (ref 70–99)

## 2024-11-05 MED ORDER — ETOMIDATE 2 MG/ML IV SOLN
INTRAVENOUS | Status: AC | PRN
  Administered 2024-11-05: 15 mg via INTRAVENOUS

## 2024-11-05 MED ORDER — EPINEPHRINE 1 MG/10ML IV SOSY
PREFILLED_SYRINGE | INTRAVENOUS | Status: AC | PRN
  Administered 2024-11-05 (×2): 1 mg via INTRAVENOUS

## 2024-11-05 MED ORDER — ETOMIDATE 2 MG/ML IV SOLN
INTRAVENOUS | Status: DC
  Filled 2024-11-05: qty 10

## 2024-11-05 MED ORDER — CEFAZOLIN SODIUM-DEXTROSE 2-4 GM/100ML-% IV SOLN: 2.0000 g | Freq: Two times a day (BID) | INTRAVENOUS | Status: DC

## 2024-11-05 MED ORDER — ROCURONIUM BROMIDE 10 MG/ML (PF) SYRINGE
PREFILLED_SYRINGE | INTRAVENOUS | Status: AC | PRN
  Administered 2024-11-05: 80 mg via INTRAVENOUS

## 2024-11-05 MED ORDER — ROCURONIUM BROMIDE 10 MG/ML (PF) SYRINGE
PREFILLED_SYRINGE | INTRAVENOUS | Status: DC
  Filled 2024-11-05: qty 20

## 2024-11-07 LAB — CULTURE, BLOOD (ROUTINE X 2)

## 2024-11-12 ENCOUNTER — Ambulatory Visit: Admitting: Podiatry

## 2024-11-19 ENCOUNTER — Ambulatory Visit: Admitting: Podiatry

## 2024-12-03 NOTE — Progress Notes (Signed)
 PHARMACY - PHYSICIAN COMMUNICATION CRITICAL VALUE ALERT - BLOOD CULTURE IDENTIFICATION (BCID)  [name]. is an 89 y.o. male who presented to [facility] on 11/04/2024 with a chief complaint of sepsis  Assessment:  MSSA in 4 of 4 bottles (include suspected source if known)  Name of physician (or Provider) Contacted: Cleatus  Current antibiotics: Vanc, Cefepime, metronidazole   Changes to prescribed antibiotics recommended:  Recommendations accepted by provider  - Will start Cefazolin 2 gm IV Q12H on 12/4 @ 0800  Results for orders placed or performed during the hospital encounter of 11/04/24  Blood Culture ID Panel (Reflexed) (Collected: 11/04/2024  1:35 PM)  Result Value Ref Range   Enterococcus faecalis NOT DETECTED NOT DETECTED   Enterococcus Faecium NOT DETECTED NOT DETECTED   Listeria monocytogenes NOT DETECTED NOT DETECTED   Staphylococcus species DETECTED (A) NOT DETECTED   Staphylococcus aureus (BCID) DETECTED (A) NOT DETECTED   Staphylococcus epidermidis NOT DETECTED NOT DETECTED   Staphylococcus lugdunensis NOT DETECTED NOT DETECTED   Streptococcus species NOT DETECTED NOT DETECTED   Streptococcus agalactiae NOT DETECTED NOT DETECTED   Streptococcus pneumoniae NOT DETECTED NOT DETECTED   Streptococcus pyogenes NOT DETECTED NOT DETECTED   A.calcoaceticus-baumannii NOT DETECTED NOT DETECTED   Bacteroides fragilis NOT DETECTED NOT DETECTED   Enterobacterales NOT DETECTED NOT DETECTED   Enterobacter cloacae complex NOT DETECTED NOT DETECTED   Escherichia coli NOT DETECTED NOT DETECTED   Klebsiella aerogenes NOT DETECTED NOT DETECTED   Klebsiella oxytoca NOT DETECTED NOT DETECTED   Klebsiella pneumoniae NOT DETECTED NOT DETECTED   Proteus species NOT DETECTED NOT DETECTED   Salmonella species NOT DETECTED NOT DETECTED   Serratia marcescens NOT DETECTED NOT DETECTED   Haemophilus influenzae NOT DETECTED NOT DETECTED   Neisseria meningitidis NOT DETECTED NOT  DETECTED   Pseudomonas aeruginosa NOT DETECTED NOT DETECTED   Stenotrophomonas maltophilia NOT DETECTED NOT DETECTED   Candida albicans NOT DETECTED NOT DETECTED   Candida auris NOT DETECTED NOT DETECTED   Candida glabrata NOT DETECTED NOT DETECTED   Candida krusei NOT DETECTED NOT DETECTED   Candida parapsilosis NOT DETECTED NOT DETECTED   Candida tropicalis NOT DETECTED NOT DETECTED   Cryptococcus neoformans/gattii NOT DETECTED NOT DETECTED   Meth resistant mecA/C and MREJ NOT DETECTED NOT DETECTED    [name] D   1:58 AM

## 2024-12-03 NOTE — ED Notes (Signed)
 Code Documentation   CPR started 228  Pt returned to V. No pulse. Was def twice at 200 J. 1 of epi given - see MAR  CPR stopped at 231 per Wife wishes. EDP Mumma at bedside.

## 2024-12-03 NOTE — ED Notes (Signed)
 Pt assumed care for this pt at 200 am from previous RN. Upon initial assessment, pt had a rhythm change to VT. No pulses found. CPR started immediately by this RN and Code alarm started. Wife at bedside during this event.

## 2024-12-03 NOTE — Code Documentation (Addendum)
 Patient time of death occurred at 19 EDP Mumma at bedside.

## 2024-12-03 NOTE — ED Notes (Signed)
 Code Documentation   CPR started approx 206.   Given epi x2. Def x1 for pulseless VF   ROSC obtained at 211

## 2024-12-03 NOTE — Progress Notes (Signed)
 Provided wife supportive presence during Code. Spoke to son and daughter via phone. Stayed with wife til son arrived. Family desired comfort measures as they had discussed earlier in the day. Offered prayer with family including daughter via phone. Family will be exploring funeral home options. AC number given to family. Family well supported and grieving well.     0300  Spiritual Encounters  Type of Visit Initial  Care provided to: Pt and family  Conversation partners present during encounter Nurse  Reason for visit Code  OnCall Visit Yes

## 2024-12-03 NOTE — ED Provider Notes (Signed)
 Code blue   Responded to CODE BLUE while the patient was in the emergency department.  Patient has a history of dementia.  Currently on amiodarone  for atrial fibrillation with rapid rate.  When I arrived patient had CPR in progress.  Received 1 epinephrine , initial rhythm was ventricular tachycardia.  Defibrillated with 200 J.  ROSC achieved, patient was intubated for definitive airway.  Patient then again lost pulses, received 2 further defibrillation's.  Another round of epinephrine .  CPR was in progress with the patient's wife stated that there had been discussions in the past with son that he would not want to be on life support.  Ultimately decided to discontinue CPR and that that would not be in his wishes.  CPR was discontinued.  Patient's time of death 58.  Hospitalist Dr. Cleatus was notified and she will work on the death certificate.  There was a question of ST elevation MI and Dr. Cleatus discussed with cardiology and updated them that the patient had unfortunately expired.  .Critical Care  Performed by: Suzanne Kirsch, MD Authorized by: Suzanne Kirsch, MD   Critical care provider statement:    Critical care time (minutes):  30   Critical care time was exclusive of:  Separately billable procedures and treating other patients   Critical care was necessary to treat or prevent imminent or life-threatening deterioration of the following conditions:  Cardiac failure   Critical care was time spent personally by me on the following activities:  Development of treatment plan with patient or surrogate, discussions with consultants, evaluation of patient's response to treatment, examination of patient, ordering and review of laboratory studies, ordering and review of radiographic studies, ordering and performing treatments and interventions, pulse oximetry, re-evaluation of patient's condition and review of old charts Procedure Name: Intubation Date/Time: 2024/11/18 3:59 AM  Performed by: Suzanne Kirsch, MDPre-anesthesia Checklist: Suction available, Emergency Drugs available, Patient identified, Patient being monitored and Timeout performed Oxygen Delivery Method: Ambu bag Preoxygenation: Pre-oxygenation with 100% oxygen Induction Type: Rapid sequence Ventilation: Mask ventilation without difficulty Laryngoscope Size: Glidescope and 3 Grade View: Grade I Tube size: 7.5 mm Number of attempts: 1 Airway Equipment and Method: Video-laryngoscopy Placement Confirmation: ETT inserted through vocal cords under direct vision, Positive ETCO2, CO2 detector and Breath sounds checked- equal and bilateral Secured at: 23 cm Tube secured with: ETT holder Dental Injury: Teeth and Oropharynx as per pre-operative assessment  Future Recommendations: Recommend- induction with short-acting agent, and alternative techniques readily available    CPR  Date/Time: 11-18-24 3:59 AM  Performed by: Suzanne Kirsch, MD Authorized by: Suzanne Kirsch, MD  CPR Procedure Details:    ACLS/BLS initiated by EMS: No     CPR/ACLS performed in the ED: Yes     Duration of CPR (minutes):  10   Outcome: Pt declared dead    CPR performed via ACLS guidelines under my direct supervision.  See RN documentation for details including defibrillator use, medications, doses and timing. Comments:     Time of death 69.      Suzanne Kirsch, MD 2024/11/18 [phone]

## 2024-12-03 NOTE — Progress Notes (Signed)
       SIGNIFICANT EVENT NOTE  Includes rapid response, CODE BLUE, acute worsening of clinical condition, need for bedside evaluation/urgent imaging, labs, consults, therapeutics  Event: Code Blue  NAME: [name]. MRN: [id number] DOB : June 07, 1933    Reason for event   CODE BLUE     Past medical history context Patient with history of A-fib s/p cardioversion a couple weeks prior, now back in A-fib, admitted earlier with hypotension after presenting with confusion. Started on IV amiodarone and heparin  on admission due to concerns for swallowing  Sequence of events -Patient reportedly was talking with nurse 15 minutes prior to code -Rhythm change seen on monitor at nursing station--V fib -CODE BLUE activated -EKG suggestive of acute MI and code STEMI activated -Following ROSC in intubation, wife, after speaking with chaplain requested that we discontinue all resuscitative measures . -CPR discontinued -Patient pronounced at 2:44 AM on 11/30/24 -Minute of silence observed -Code STEMI canceled/intensivist consult canceled -Condolences expressed to wife    11-30-24    2:46 AM 11-30-2024    2:25 AM 2024/11/30    2:24 AM  Vitals with BMI  Systolic 0  123  Diastolic 0  78  Pulse 0 96      Initial patient rapid assessment  CODE BLUE NOTE  Indication:  At the time of arrival on scene, ACLS protocol was underway.    Technical Description:  - CPR performance duration:  Please see code log  - Was defibrillation or cardioversion used? Yes   - Was external pacer placed? No  - Was patient intubated pre/post CPR? Yes   Post CPR evaluation:  - Final Status - Was patient successfully resuscitated ? CPR aborted at request of wife who stated that patient will never want to be on a machine  - What is current hemodynamic status?  Deceased, time of death 2:44 AM-rhythm asystole No heart sounds were auscultated over the entire precordium. Spoke with wife.     Cause  of death: Acute MI         CRITICAL CARE Performed by: Delayne LULLA Solian   Total critical care time: 35 minutes  Critical care time was exclusive of separately billable procedures and treating other patients.  Critical care was necessary to treat or prevent imminent or life-threatening deterioration.  Critical care was time spent personally by me on the following activities: development of treatment plan with patient and/or surrogate as well as nursing, discussions with consultants, evaluation of patient's response to treatment, examination of patient, obtaining history from patient or surrogate, ordering and performing treatments and interventions, ordering and review of laboratory studies, ordering and review of radiographic studies, pulse oximetry and re-evaluation of patient's condition.

## 2024-12-03 DEATH — deceased

## 2025-01-22 ENCOUNTER — Ambulatory Visit: Admitting: Cardiovascular Disease
